# Patient Record
Sex: Male | Born: 1950 | Race: White | Hispanic: No | Marital: Married | Smoking: Light tobacco smoker
Health system: Southern US, Community
[De-identification: ages and names within clinical notes are randomized; demographics above are authoritative.]

## PROBLEM LIST (undated history)

## (undated) DIAGNOSIS — J449 Chronic obstructive pulmonary disease, unspecified: Secondary | ICD-10-CM

## (undated) DIAGNOSIS — F419 Anxiety disorder, unspecified: Secondary | ICD-10-CM

## (undated) DIAGNOSIS — K635 Polyp of colon: Secondary | ICD-10-CM

## (undated) DIAGNOSIS — N400 Enlarged prostate without lower urinary tract symptoms: Secondary | ICD-10-CM

## (undated) DIAGNOSIS — E039 Hypothyroidism, unspecified: Secondary | ICD-10-CM

## (undated) DIAGNOSIS — F32A Depression, unspecified: Secondary | ICD-10-CM

## (undated) DIAGNOSIS — E785 Hyperlipidemia, unspecified: Secondary | ICD-10-CM

## (undated) DIAGNOSIS — N200 Calculus of kidney: Secondary | ICD-10-CM

## (undated) DIAGNOSIS — D649 Anemia, unspecified: Secondary | ICD-10-CM

## (undated) DIAGNOSIS — I639 Cerebral infarction, unspecified: Secondary | ICD-10-CM

## (undated) DIAGNOSIS — R569 Unspecified convulsions: Secondary | ICD-10-CM

## (undated) DIAGNOSIS — E119 Type 2 diabetes mellitus without complications: Secondary | ICD-10-CM

## (undated) DIAGNOSIS — Z87442 Personal history of urinary calculi: Secondary | ICD-10-CM

## (undated) DIAGNOSIS — K5730 Diverticulosis of large intestine without perforation or abscess without bleeding: Secondary | ICD-10-CM

## (undated) DIAGNOSIS — I10 Essential (primary) hypertension: Secondary | ICD-10-CM

## (undated) DIAGNOSIS — N189 Chronic kidney disease, unspecified: Secondary | ICD-10-CM

## (undated) DIAGNOSIS — F329 Major depressive disorder, single episode, unspecified: Secondary | ICD-10-CM

## (undated) HISTORY — DX: Chronic kidney disease, unspecified: N18.9

## (undated) HISTORY — DX: Unspecified convulsions: R56.9

## (undated) HISTORY — DX: Chronic obstructive pulmonary disease, unspecified: J44.9

## (undated) HISTORY — PX: VARICOCELE EXCISION: SUR582

## (undated) HISTORY — DX: Type 2 diabetes mellitus without complications: E11.9

## (undated) HISTORY — DX: Hyperlipidemia, unspecified: E78.5

## (undated) HISTORY — PX: OTHER SURGICAL HISTORY: SHX169

## (undated) HISTORY — PX: COLONOSCOPY: SHX174

## (undated) HISTORY — PX: THYROIDECTOMY: SHX17

## (undated) HISTORY — DX: Essential (primary) hypertension: I10

## (undated) HISTORY — DX: Cerebral infarction, unspecified: I63.9

## (undated) SURGERY — VIDEO BRONCHOSCOPY WITHOUT FLUORO
Anesthesia: Moderate Sedation

---
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
## 2005-12-26 ENCOUNTER — Ambulatory Visit: Payer: Self-pay | Admitting: Unknown Physician Specialty

## 2008-07-28 ENCOUNTER — Ambulatory Visit: Payer: Self-pay | Admitting: Unknown Physician Specialty

## 2010-06-27 ENCOUNTER — Ambulatory Visit: Payer: Self-pay | Admitting: Internal Medicine

## 2012-02-01 ENCOUNTER — Ambulatory Visit: Payer: Self-pay | Admitting: Internal Medicine

## 2012-06-23 ENCOUNTER — Ambulatory Visit: Payer: Self-pay | Admitting: Unknown Physician Specialty

## 2012-06-24 LAB — PATHOLOGY REPORT

## 2013-08-08 ENCOUNTER — Emergency Department: Payer: Self-pay | Admitting: Emergency Medicine

## 2013-08-08 DIAGNOSIS — E119 Type 2 diabetes mellitus without complications: Secondary | ICD-10-CM | POA: Insufficient documentation

## 2013-08-08 DIAGNOSIS — D649 Anemia, unspecified: Secondary | ICD-10-CM | POA: Insufficient documentation

## 2013-08-08 DIAGNOSIS — J449 Chronic obstructive pulmonary disease, unspecified: Secondary | ICD-10-CM | POA: Insufficient documentation

## 2013-08-08 DIAGNOSIS — E785 Hyperlipidemia, unspecified: Secondary | ICD-10-CM | POA: Insufficient documentation

## 2013-08-08 DIAGNOSIS — M549 Dorsalgia, unspecified: Secondary | ICD-10-CM

## 2013-08-08 DIAGNOSIS — I10 Essential (primary) hypertension: Secondary | ICD-10-CM | POA: Insufficient documentation

## 2013-08-08 DIAGNOSIS — G8929 Other chronic pain: Secondary | ICD-10-CM | POA: Insufficient documentation

## 2013-08-08 DIAGNOSIS — N400 Enlarged prostate without lower urinary tract symptoms: Secondary | ICD-10-CM | POA: Insufficient documentation

## 2013-08-08 DIAGNOSIS — E1169 Type 2 diabetes mellitus with other specified complication: Secondary | ICD-10-CM | POA: Insufficient documentation

## 2013-08-08 DIAGNOSIS — L719 Rosacea, unspecified: Secondary | ICD-10-CM | POA: Insufficient documentation

## 2013-08-08 LAB — URINALYSIS, COMPLETE
Bacteria: NONE SEEN
Bilirubin,UR: NEGATIVE
Glucose,UR: 50 mg/dL (ref 0–75)
Ketone: NEGATIVE
Leukocyte Esterase: NEGATIVE
Nitrite: NEGATIVE
Ph: 5 (ref 4.5–8.0)
Protein: >=500
RBC,UR: 4 /HPF (ref 0–5)
Specific Gravity: 1.012 (ref 1.003–1.030)
Squamous Epithelial: <1
WBC UR: 4 /HPF (ref 0–5)

## 2013-08-08 LAB — CBC
HCT: 42.1 % (ref 40.0–52.0)
HGB: 14.2 g/dL (ref 13.0–18.0)
MCH: 30.9 pg (ref 26.0–34.0)
MCHC: 33.7 g/dL (ref 32.0–36.0)
MCV: 92 fL (ref 80–100)
Platelet: 144 10*3/uL — ABNORMAL LOW (ref 150–440)
RBC: 4.59 10*6/uL (ref 4.40–5.90)
RDW: 12.9 % (ref 11.5–14.5)
WBC: 12.4 10*3/uL — ABNORMAL HIGH (ref 3.8–10.6)

## 2013-08-08 LAB — COMPREHENSIVE METABOLIC PANEL
Albumin: 3.4 g/dL (ref 3.4–5.0)
Alkaline Phosphatase: 72 U/L
Anion Gap: 10 (ref 7–16)
BUN: 32 mg/dL — ABNORMAL HIGH (ref 7–18)
Bilirubin,Total: 0.3 mg/dL (ref 0.2–1.0)
Calcium, Total: 9.1 mg/dL (ref 8.5–10.1)
Chloride: 107 mmol/L (ref 98–107)
Co2: 21 mmol/L (ref 21–32)
Creatinine: 2.04 mg/dL — ABNORMAL HIGH (ref 0.60–1.30)
EGFR (African American): 38 — ABNORMAL LOW
EGFR (Non-African Amer.): 33 — ABNORMAL LOW
Glucose: 100 mg/dL — ABNORMAL HIGH (ref 65–99)
Osmolality: 283 (ref 275–301)
Potassium: 3.9 mmol/L (ref 3.5–5.1)
SGOT(AST): 30 U/L (ref 15–37)
SGPT (ALT): 22 U/L (ref 12–78)
Sodium: 138 mmol/L (ref 136–145)
Total Protein: 6.7 g/dL (ref 6.4–8.2)

## 2013-08-26 ENCOUNTER — Ambulatory Visit: Payer: Self-pay | Admitting: Neurology

## 2014-02-15 DIAGNOSIS — M7030 Other bursitis of elbow, unspecified elbow: Secondary | ICD-10-CM | POA: Insufficient documentation

## 2014-02-15 DIAGNOSIS — M7021 Olecranon bursitis, right elbow: Secondary | ICD-10-CM | POA: Insufficient documentation

## 2014-03-08 DIAGNOSIS — D1720 Benign lipomatous neoplasm of skin and subcutaneous tissue of unspecified limb: Secondary | ICD-10-CM | POA: Insufficient documentation

## 2014-03-08 DIAGNOSIS — M65811 Other synovitis and tenosynovitis, right shoulder: Secondary | ICD-10-CM | POA: Insufficient documentation

## 2014-03-18 ENCOUNTER — Ambulatory Visit: Payer: Self-pay | Admitting: Surgery

## 2015-03-15 DIAGNOSIS — Z794 Long term (current) use of insulin: Secondary | ICD-10-CM | POA: Insufficient documentation

## 2015-03-15 DIAGNOSIS — E119 Type 2 diabetes mellitus without complications: Secondary | ICD-10-CM | POA: Insufficient documentation

## 2015-03-29 DIAGNOSIS — N183 Chronic kidney disease, stage 3 unspecified: Secondary | ICD-10-CM | POA: Insufficient documentation

## 2015-05-05 ENCOUNTER — Encounter: Payer: Medicare Other | Attending: Internal Medicine | Admitting: *Deleted

## 2015-05-05 ENCOUNTER — Encounter: Payer: Self-pay | Admitting: *Deleted

## 2015-05-05 VITALS — BP 138/72 | Ht 72.0 in | Wt 226.1 lb

## 2015-05-05 DIAGNOSIS — N184 Chronic kidney disease, stage 4 (severe): Secondary | ICD-10-CM

## 2015-05-05 DIAGNOSIS — E1122 Type 2 diabetes mellitus with diabetic chronic kidney disease: Secondary | ICD-10-CM

## 2015-05-05 DIAGNOSIS — E119 Type 2 diabetes mellitus without complications: Secondary | ICD-10-CM | POA: Insufficient documentation

## 2015-05-05 DIAGNOSIS — Z794 Long term (current) use of insulin: Secondary | ICD-10-CM

## 2015-05-05 NOTE — Patient Instructions (Addendum)
Check blood sugars at least 3 x day before each meal or as needed for symptoms  Exercise: Begin walking  for 15 minutes 3  days a week and gradually increase to 150 minutes/week Eat 3 meals day,  1-2  snacks a day Space meals 4-6 hours apart Don't skip meals Complete 3 Day Food Record and bring to next appt Quit smoking Make an eye doctor appointment Bring blood sugar records to the next appointment Carry fast acting glucose and a snack at all times Return for appointment on:  Friday March 24 at 9:30 am - with Jeannene Patella (dietitian)

## 2015-05-05 NOTE — Progress Notes (Signed)
Diabetes Self-Management Education  Visit Type: First/Initial  Appt. Start Time: 1320 Appt. End Time: 1500  05/05/2015  [name], identified by name and date of birth, is a 65 y.o. male with a diagnosis of Diabetes: Type 2.   ASSESSMENT  Blood pressure 138/72, height 6' (1.829 m), weight 226 lb 1.6 oz (102.558 kg). Body mass index is 30.66 kg/(m^2).      Diabetes Self-Management Education - 05/05/15 1542    Visit Information   Visit Type First/Initial   Initial Visit   Diabetes Type Type 2   Are you currently following a meal plan? No   Are you taking your medications as prescribed? No Pt skips meals and insulin bolus   Date Diagnosed 10 years ago   Health Coping   How would you rate your overall health? Fair   Psychosocial Assessment   Patient Belief/Attitude about Diabetes Other (comment)  "worried"   Self-care barriers None   Self-management support Doctor's office;Family   Other persons present Spouse/SO   Patient Concerns Nutrition/Meal planning;Medication;Monitoring;Healthy Lifestyle;Problem Solving;Glycemic Control;Weight Control   Special Needs None   Preferred Learning Style Auditory   Learning Readiness Contemplating   How often do you need to have someone help you when you read instructions, pamphlets, or other written materials from your doctor or pharmacy? 1 - Never   What is the last grade level you completed in school? 12   Complications   Last HgB A1C per patient/outside source 7.9 %  04/11/15   How often do you check your blood sugar? 1-2 times/day   Fasting Blood glucose range (mg/dL) 70-129;130-179;180-200;>200  FBG's 72-234 mg/dL   Postprandial Blood glucose range (mg/dL) --  pre-lunch 78-243 mg/dL; pre-supper 85-217 mg/dL   Have you had a dilated eye exam in the past 12 months? No   Have you had a dental exam in the past 12 months? Yes   Are you checking your feet? Yes   How many days per week are you checking your feet? 3   Dietary  Intake   Breakfast skips or has pack of peanut butter crackers   Lunch sandwich or oatmeal with peanut butter or salad   Dinner cereal, meat and vegetables   Snack (evening) nuts   Beverage(s) water, vegetable juice, milk   Exercise   Exercise Type ADL's   Patient Education   Previous Diabetes Education Yes (please comment)  approx 10 years ago at [facility]   Disease state  Definition of diabetes, type 1 and 2, and the diagnosis of diabetes   Nutrition management  Role of diet in the treatment of diabetes and the relationship between the three main macronutrients and blood glucose level;Carbohydrate counting;Meal timing in regards to the patients' current diabetes medication.;Effects of alcohol on blood glucose and safety factors with consumption of alcohol.   Physical activity and exercise  Role of exercise on diabetes management, blood pressure control and cardiac health.   Medications Reviewed patients medication for diabetes, action, purpose, timing of dose and side effects.   Monitoring Purpose and frequency of SMBG.;Identified appropriate SMBG and/or A1C goals.   Acute complications Taught treatment of hypoglycemia - the 15 rule.   Chronic complications Relationship between chronic complications and blood glucose control;Retinopathy and reason for yearly dilated eye exams;Nephropathy, what it is, prevention of, the use of ACE, ARB's and early detection of through urine microalbumia.   Psychosocial adjustment Identified and addressed patients feelings and concerns about diabetes   Personal strategies to promote health Review  risk of smoking and offered smoking cessation   Individualized Goals (developed by patient)   Reducing Risk Improve blood sugars Decrease medications Prevent diabetes complications Lose weight Lead a healthier lifestyle Become more fit Quit smoking   Outcomes   Expected Outcomes Demonstrated interest in learning. Expect positive outcomes      Individualized Plan  for Diabetes Self-Management Training:   Learning Objective:  Patient will have a greater understanding of diabetes self-management. Patient education plan is to attend individual and/or group sessions per assessed needs and concerns.   Plan:   Patient Instructions  Check blood sugars at least 3 x day before each meal or as needed for symptoms  Exercise: Begin walking  for 15 minutes 3  days a week and gradually increase to 150 minutes/week Eat 3 meals day,  1-2  snacks a day Space meals 4-6 hours apart Don't skip meals Complete 3 Day Food Record and bring to next appt Quit smoking Make an eye doctor appointment Bring blood sugar records to the next appointment Carry fast acting glucose and a snack at all times Return for appointment on:  Friday March 24 at 9:30 am - with Jeannene Patella (dietitian)   Expected Outcomes:  Demonstrated interest in learning. Expect positive outcomes  Education material provided:  General Meal Planning Guidelines Simple Meal Plan 3 Day Food Record Symptoms, causes and treatments of Hypoglycemia  If problems or questions, patient to contact team via:   Johny Drilling, [place], [place], CDE [phone]  Future DSME appointment:  Friday May 20, 2015 at 9:30 am with [facility] (dietitian)

## 2015-05-19 ENCOUNTER — Other Ambulatory Visit
Admission: RE | Admit: 2015-05-19 | Discharge: 2015-05-19 | Disposition: A | Discharge status: Home / Self Care | Payer: Medicare Other | Source: Ambulatory Visit | Attending: Nurse Practitioner | Admitting: Nurse Practitioner

## 2015-05-19 DIAGNOSIS — K529 Noninfective gastroenteritis and colitis, unspecified: Secondary | ICD-10-CM | POA: Insufficient documentation

## 2015-05-19 LAB — GASTROINTESTINAL PANEL BY PCR, STOOL (REPLACES STOOL CULTURE)

## 2015-05-19 LAB — C DIFFICILE QUICK SCREEN W PCR REFLEX
C Diff antigen: NEGATIVE
C Diff interpretation: NEGATIVE
C Diff toxin: NEGATIVE

## 2015-05-20 ENCOUNTER — Encounter: Payer: Self-pay | Admitting: Dietician

## 2015-05-20 ENCOUNTER — Encounter: Payer: Medicare Other | Admitting: Dietician

## 2015-05-20 VITALS — BP 136/66 | Ht 72.0 in | Wt 225.6 lb

## 2015-05-20 DIAGNOSIS — R0789 Other chest pain: Secondary | ICD-10-CM | POA: Insufficient documentation

## 2015-05-20 DIAGNOSIS — Z720 Tobacco use: Secondary | ICD-10-CM | POA: Insufficient documentation

## 2015-05-20 DIAGNOSIS — E119 Type 2 diabetes mellitus without complications: Secondary | ICD-10-CM | POA: Diagnosis not present

## 2015-05-20 DIAGNOSIS — N184 Chronic kidney disease, stage 4 (severe): Principal | ICD-10-CM

## 2015-05-20 DIAGNOSIS — G40909 Epilepsy, unspecified, not intractable, without status epilepticus: Secondary | ICD-10-CM | POA: Insufficient documentation

## 2015-05-20 DIAGNOSIS — Z794 Long term (current) use of insulin: Secondary | ICD-10-CM

## 2015-05-20 DIAGNOSIS — R569 Unspecified convulsions: Secondary | ICD-10-CM | POA: Insufficient documentation

## 2015-05-20 DIAGNOSIS — N200 Calculus of kidney: Secondary | ICD-10-CM | POA: Insufficient documentation

## 2015-05-20 DIAGNOSIS — L709 Acne, unspecified: Secondary | ICD-10-CM | POA: Insufficient documentation

## 2015-05-20 DIAGNOSIS — E1122 Type 2 diabetes mellitus with diabetic chronic kidney disease: Secondary | ICD-10-CM

## 2015-05-20 NOTE — Progress Notes (Signed)
Diabetes Self-Management Education  Visit Type:  Follow-up  Appt. Start Time: 0930 Appt. End Time: [id number]  05/20/2015  [name], identified by name and date of birth, is a 65 y.o. male with a diagnosis of Diabetes:  .   ASSESSMENT  Blood pressure 136/66, height 6' (1.829 m), weight 225 lb 9.6 oz (102.331 kg). Body mass index is 30.59 kg/(m^2).       Diabetes Self-Management Education - [id number]    Complications   How often do you check your blood sugar? 1-2 times/day  2-3 times daily   Fasting Blood glucose range (mg/dL) 70-129   Postprandial Blood glucose range (mg/dL) 130-179  before meals   Have you had a dilated eye exam in the past 12 months? No   Have you had a dental exam in the past 12 months? Yes   Are you checking your feet? Yes   How many days per week are you checking your feet? 3   Dietary Intake   Breakfast oatmeal or cream of wheat, sometimes Nabs, occasionally eggs and bacon or pancakes or biscuit   Lunch sandwich when at home; out on Fridays -- [place], [employer] [place]), various restaurants   Snack (afternoon) 1 beer with nuts or chex mix, sometimes Nabs or granola bar   Dinner varies: kale salad 3/23; chicken pie and butter beans 3/22; sometimes pizza. Trying to increase vegetables and fruits.   Beverage(s) flavored water, some sweet tea    Exercise   Exercise Type ADL's  likes to swim, not much energy lately   Patient Education   Disease state  Other (comment)  Diabetic Kidney Disease   Nutrition management  Role of diet in the treatment of diabetes and the relationship between the three main macronutrients and blood glucose level;Meal timing in regards to the patients' current diabetes medication.;Information on hints to eating out and maintain blood glucose control.;Meal options for control of blood glucose level and chronic complications.;Food label reading, portion sizes and measuring food.  limiting protein intake and sodium to keep GFR  stable as long as possible   Outcomes   Program Status Completed      Learning Objective:  Patient will have a greater understanding of diabetes self-management. Patient education plan is to attend individual and/or group sessions per assessed needs and concerns.   Plan:   Patient Instructions   Keep protein portions to 3oz or less.   Allow for 4 servings of carbohydrate foods with each meal. If you drink any sweet tea, count each 6oz as 1 carb serving.   You can also try 1/2-sweet tea, ask for 1/2 and 1/2 tea at restaurants.   Keep up your work to include plenty of veggies and fruits!     Expected Outcomes:  Demonstrated interest in learning. Expect positive outcomes  Education material provided: Planning A Balanced Meal with guide for 1800kcal daily.          Top 15 foods for people with kidney disease.          If problems or questions, patient to contact team via:  Phone  Future DSME appointment: - none scheduled, patient has completed refresher program.

## 2015-05-20 NOTE — Patient Instructions (Addendum)
   Keep protein portions to 3oz or less.   Allow for 4 servings of carbohydrate foods with each meal. If you drink any sweet tea, count each 6oz as 1 carb serving.   You can also try 1/2-sweet tea, ask for 1/2 and 1/2 tea at restaurants.   Keep up your work to include plenty of veggies and fruits!

## 2015-05-27 ENCOUNTER — Encounter: Payer: Self-pay | Admitting: *Deleted

## 2015-05-30 ENCOUNTER — Ambulatory Visit: Payer: Medicare Other | Admitting: Certified Registered Nurse Anesthetist

## 2015-05-30 ENCOUNTER — Encounter: Payer: Self-pay | Admitting: *Deleted

## 2015-05-30 ENCOUNTER — Encounter
Admission: RE | Disposition: A | Discharge status: Home / Self Care | Payer: Self-pay | Source: Ambulatory Visit | Attending: Unknown Physician Specialty

## 2015-05-30 ENCOUNTER — Ambulatory Visit
Admission: RE | Admit: 2015-05-30 | Discharge: 2015-05-30 | Disposition: A | Discharge status: Home / Self Care | Payer: Medicare Other | Source: Ambulatory Visit | Attending: Unknown Physician Specialty | Admitting: Unknown Physician Specialty

## 2015-05-30 DIAGNOSIS — Z7984 Long term (current) use of oral hypoglycemic drugs: Secondary | ICD-10-CM | POA: Diagnosis not present

## 2015-05-30 DIAGNOSIS — N189 Chronic kidney disease, unspecified: Secondary | ICD-10-CM | POA: Insufficient documentation

## 2015-05-30 DIAGNOSIS — D122 Benign neoplasm of ascending colon: Secondary | ICD-10-CM | POA: Diagnosis not present

## 2015-05-30 DIAGNOSIS — Z7951 Long term (current) use of inhaled steroids: Secondary | ICD-10-CM | POA: Diagnosis not present

## 2015-05-30 DIAGNOSIS — D125 Benign neoplasm of sigmoid colon: Secondary | ICD-10-CM | POA: Diagnosis not present

## 2015-05-30 DIAGNOSIS — E1122 Type 2 diabetes mellitus with diabetic chronic kidney disease: Secondary | ICD-10-CM | POA: Diagnosis not present

## 2015-05-30 DIAGNOSIS — K5730 Diverticulosis of large intestine without perforation or abscess without bleeding: Secondary | ICD-10-CM | POA: Diagnosis not present

## 2015-05-30 DIAGNOSIS — I129 Hypertensive chronic kidney disease with stage 1 through stage 4 chronic kidney disease, or unspecified chronic kidney disease: Secondary | ICD-10-CM | POA: Diagnosis not present

## 2015-05-30 DIAGNOSIS — D123 Benign neoplasm of transverse colon: Secondary | ICD-10-CM | POA: Diagnosis not present

## 2015-05-30 DIAGNOSIS — E785 Hyperlipidemia, unspecified: Secondary | ICD-10-CM | POA: Insufficient documentation

## 2015-05-30 DIAGNOSIS — Z794 Long term (current) use of insulin: Secondary | ICD-10-CM | POA: Insufficient documentation

## 2015-05-30 DIAGNOSIS — K529 Noninfective gastroenteritis and colitis, unspecified: Secondary | ICD-10-CM | POA: Diagnosis not present

## 2015-05-30 DIAGNOSIS — N400 Enlarged prostate without lower urinary tract symptoms: Secondary | ICD-10-CM | POA: Insufficient documentation

## 2015-05-30 DIAGNOSIS — K640 First degree hemorrhoids: Secondary | ICD-10-CM | POA: Diagnosis not present

## 2015-05-30 DIAGNOSIS — J449 Chronic obstructive pulmonary disease, unspecified: Secondary | ICD-10-CM | POA: Insufficient documentation

## 2015-05-30 DIAGNOSIS — Z79899 Other long term (current) drug therapy: Secondary | ICD-10-CM | POA: Diagnosis not present

## 2015-05-30 DIAGNOSIS — F17210 Nicotine dependence, cigarettes, uncomplicated: Secondary | ICD-10-CM | POA: Insufficient documentation

## 2015-05-30 HISTORY — DX: Benign prostatic hyperplasia without lower urinary tract symptoms: N40.0

## 2015-05-30 HISTORY — PX: COLONOSCOPY WITH PROPOFOL: SHX5780

## 2015-05-30 HISTORY — DX: Calculus of kidney: N20.0

## 2015-05-30 LAB — GLUCOSE, CAPILLARY: Glucose-Capillary: 182 mg/dL — ABNORMAL HIGH (ref 65–99)

## 2015-05-30 SURGERY — COLONOSCOPY WITH PROPOFOL
Anesthesia: General

## 2015-05-30 MED ORDER — MIDAZOLAM HCL 2 MG/2ML IJ SOLN
INTRAMUSCULAR | Status: DC | PRN
  Administered 2015-05-30: 1 mg via INTRAVENOUS

## 2015-05-30 MED ORDER — LIDOCAINE HCL (CARDIAC) 20 MG/ML IV SOLN
INTRAVENOUS | Status: DC | PRN
  Administered 2015-05-30: 60 mg via INTRAVENOUS

## 2015-05-30 MED ORDER — SODIUM CHLORIDE 0.9 % IV SOLN
INTRAVENOUS | Status: DC
  Administered 2015-05-30: 08:00:00 via INTRAVENOUS

## 2015-05-30 MED ORDER — PROPOFOL 500 MG/50ML IV EMUL
INTRAVENOUS | Status: DC | PRN
  Administered 2015-05-30: 140 ug/kg/min via INTRAVENOUS

## 2015-05-30 MED ORDER — SODIUM CHLORIDE 0.9 % IV SOLN: INTRAVENOUS | Status: DC

## 2015-05-30 MED ORDER — PROPOFOL 10 MG/ML IV BOLUS
INTRAVENOUS | Status: DC | PRN
  Administered 2015-05-30: 30 mg via INTRAVENOUS
  Administered 2015-05-30 (×2): 20 mg via INTRAVENOUS

## 2015-05-30 NOTE — Transfer of Care (Signed)
Immediate Anesthesia Transfer of Care Note  Patient: Kyle [name]  Procedure(s) Performed: Procedure(s): COLONOSCOPY WITH PROPOFOL (N/A)  Patient Location: PACU  Anesthesia Type:General  Level of Consciousness: sedated  Airway & Oxygen Therapy: Patient Spontanous Breathing and Patient connected to nasal cannula oxygen  Post-op Assessment: Report given to RN and Post -op Vital signs reviewed and stable  Post vital signs: Reviewed and stable  Last Vitals:  Filed Vitals:   05/30/15 0742  BP: 135/56  Pulse: 81  Temp: 35.7 C  Resp: 18    Complications: No apparent anesthesia complications

## 2015-05-30 NOTE — Anesthesia Procedure Notes (Signed)
Date/Time: 05/30/2015 8:19 AM Performed by: Johnna Acosta Pre-anesthesia Checklist: Patient identified, Emergency Drugs available, Suction available, Patient being monitored and Timeout performed Patient Re-evaluated:Patient Re-evaluated prior to inductionOxygen Delivery Method: Nasal cannula

## 2015-05-30 NOTE — Anesthesia Postprocedure Evaluation (Signed)
Anesthesia Post Note  Patient: Kyle [name]  Procedure(s) Performed: Procedure(s) (LRB): COLONOSCOPY WITH PROPOFOL (N/A)  Patient location during evaluation: Endoscopy Anesthesia Type: General Level of consciousness: awake and alert Pain management: pain level controlled Vital Signs Assessment: post-procedure vital signs reviewed and stable Respiratory status: spontaneous breathing, nonlabored ventilation, respiratory function stable and patient connected to nasal cannula oxygen Cardiovascular status: blood pressure returned to baseline and stable Postop Assessment: no signs of nausea or vomiting Anesthetic complications: no    Last Vitals:  Filed Vitals:   05/30/15 0920 05/30/15 0930  BP: 138/63 137/64  Pulse: 67 68  Temp:    Resp: 12 15    Last Pain: There were no vitals filed for this visit.               Martha Clan

## 2015-05-30 NOTE — Anesthesia Preprocedure Evaluation (Signed)
Anesthesia Evaluation  Patient identified by MRN, date of birth, ID band Patient awake    Reviewed: Allergy & Precautions, H&P , NPO status , Patient's Chart, lab work & pertinent test results, reviewed documented beta blocker date and time   History of Anesthesia Complications Negative for: history of anesthetic complications  Airway Mallampati: II  TM Distance: >3 FB Neck ROM: full    Dental no notable dental hx. (+) Poor Dentition, Caps, Missing   Pulmonary neg shortness of breath, sleep apnea (likely based on symptoms) , COPD, neg recent URI, Current Smoker,    Pulmonary exam normal breath sounds clear to auscultation       Cardiovascular Exercise Tolerance: Good hypertension, On Medications and On Home Beta Blockers (-) angina(-) CAD, (-) Past MI, (-) Cardiac Stents and (-) CABG Normal cardiovascular exam(-) dysrhythmias (-) Valvular Problems/Murmurs Rhythm:regular Rate:Normal     Neuro/Psych Seizures -,  negative psych ROS   GI/Hepatic negative GI ROS, Neg liver ROS,   Endo/Other  diabetes, Insulin Dependent, Oral Hypoglycemic Agents  Renal/GU CRFRenal disease  negative genitourinary   Musculoskeletal   Abdominal   Peds  Hematology negative hematology ROS (+)   Anesthesia Other Findings Past Medical History:   Diabetes mellitus without complication (HCC)                 Hypertension                                                 Hyperlipidemia                                               COPD (chronic obstructive pulmonary disease) (*              Seizures (HCC)                                               BPH (benign prostatic hyperplasia)                           Chronic kidney disease                                       Nephrolithiasis                                              Reproductive/Obstetrics negative OB ROS                             Anesthesia  Physical Anesthesia Plan  ASA: III  Anesthesia Plan: General   Post-op Pain Management:    Induction:   Airway Management Planned:   Additional Equipment:   Intra-op Plan:   Post-operative Plan:   Informed Consent: I have reviewed the patients History and Physical, chart, labs and discussed the procedure including  the risks, benefits and alternatives for the proposed anesthesia with the patient or authorized representative who has indicated his/her understanding and acceptance.   Dental Advisory Given  Plan Discussed with: Anesthesiologist, CRNA and Surgeon  Anesthesia Plan Comments:         Anesthesia Quick Evaluation

## 2015-05-30 NOTE — H&P (Signed)
Primary Care Physician:  Idelle Crouch, MD Primary Gastroenterologist:  Dr. Vira Agar  Pre-Procedure History & Physical: HPI:  Kyle [name] is a 65 y.o. male is here for an colonoscopy.   Past Medical History  Diagnosis Date  . Diabetes mellitus without complication ([place])   . Hypertension   . Hyperlipidemia   . COPD (chronic obstructive pulmonary disease) ([place])   . Seizures ([place])   . BPH (benign prostatic hyperplasia)   . Chronic kidney disease   . Nephrolithiasis     Past Surgical History  Procedure Laterality Date  . Kidney stone    . Colonoscopy    . Varicocele excision      Prior to Admission medications   Medication Sig Start Date End Date Taking? Authorizing Provider  hydrALAZINE (APRESOLINE) 50 MG tablet Take 1 tablet by mouth daily. 05/12/15  Yes Historical Provider, MD  levETIRAcetam (KEPPRA) 500 MG tablet Take 500 mg by mouth daily. 06/17/14  Yes Historical Provider, MD  losartan-hydrochlorothiazide (HYZAAR) 100-12.5 MG tablet Take 1 tablet by mouth daily. 07/01/14 07/01/15 Yes Historical Provider, MD  metoprolol succinate (TOPROL-XL) 50 MG 24 hr tablet Take 1 tablet by mouth daily. 03/15/15 03/14/16 Yes Historical Provider, MD  atorvastatin (LIPITOR) 10 MG tablet Take 10 mg by mouth daily. 04/08/15   Historical Provider, MD  Cholecalciferol (VITAMIN D3) 5000 units TABS Take 1 tablet by mouth daily.    Historical Provider, MD  fluticasone (FLONASE) 50 MCG/ACT nasal spray Place 2 sprays into both nostrils daily as needed. Reported on 05/05/2015 01/14/14   Historical Provider, MD  glipiZIDE (GLUCOTROL XL) 10 MG 24 hr tablet Take 10 mg by mouth daily. 03/29/15   Historical Provider, MD  insulin lispro (HUMALOG) 100 UNIT/ML injection VGo - 40; 5 clicks with each meal; 3 clicks for snacks; if over [id number] take 2 clicks [id number]   Historical Provider, MD  Multiple Vitamins-Minerals (MULTIVITAMIN MEN PO) Take 1 tablet by mouth daily.    Historical Provider, MD  nicotine (RA NICOTINE) 21  mg/24hr patch Place 1 patch onto the skin daily. Reported on 05/05/2015 03/15/15   Historical Provider, MD    Allergies as of 05/20/2015  . (No Known Allergies)    Family History  Problem Relation Age of Onset  . Diabetes Mother   . Diabetes Maternal Grandmother   . Diabetes Maternal Grandfather     Social History   Social History  . Marital Status: Married    Spouse Name: N/A  . Number of Children: N/A  . Years of Education: N/A   Occupational History  . Not on file.   Social History Main Topics  . Smoking status: Current Every Day Smoker -- 0.50 packs/day for 30 years    Types: Cigarettes  . Smokeless tobacco: Never Used  . Alcohol Use: 4.8 oz/week    8 Cans of beer per week     Comment: beer or wine   . Drug Use: No  . Sexual Activity: Not on file   Other Topics Concern  . Not on file   Social History Narrative    Review of Systems: See HPI, otherwise negative ROS  Physical Exam: BP 135/56 mmHg  Pulse 81  Temp(Src) 96.2 F (35.7 C) (Tympanic)  Resp 18  Ht 6' (1.829 m)  Wt 102.059 kg (225 lb)  BMI 30.51 kg/m2  SpO2 98% General:   Alert,  pleasant and cooperative in NAD Head:  Normocephalic and atraumatic. Neck:  Supple; no masses or thyromegaly. Lungs:  Clear  throughout to auscultation.    Heart:  Regular rate and rhythm. Abdomen:  Soft, nontender and nondistended. Normal bowel sounds, without guarding, and without rebound.   Neurologic:  Alert and  oriented x4;  grossly normal neurologically.  Impression/Plan: SAQIB SUNDE is here for an colonoscopy to be performed for [facility] colon polyps,chronic diarrhea  Risks, benefits, limitations, and alternatives regarding  colonoscopy have been reviewed with the patient.  Questions have been answered.  All parties agreeable.   Gaylyn Cheers, MD  05/30/2015, 8:18 AM

## 2015-05-30 NOTE — Op Note (Signed)
[facility] Gastroenterology Patient Name: Kyle [name] Procedure Date: 05/30/2015 8:12 AM MRN: [id number] Account #: [id number] Date of Birth: 08-14-1950 Admit Type: Outpatient Age: 65 Room: [facility] ENDO ROOM 1 Gender: Male Note Status: Finalized Procedure:            Colonoscopy Indications:          High risk colon cancer surveillance: Personal history                        of colonic polyps Providers:            Manya Silvas, MD Referring MD:         Leonie Douglas. Doy Hutching, MD (Referring MD) Medicines:            Propofol per Anesthesia Complications:        No immediate complications. Procedure:            Pre-Anesthesia Assessment:                       - After reviewing the risks and benefits, the patient                        was deemed in satisfactory condition to undergo the                        procedure.                       After obtaining informed consent, the colonoscope was                        passed under direct vision. Throughout the procedure,                        the patient's blood pressure, pulse, and oxygen                        saturations were monitored continuously. The Olympus                        PCF-H180AL colonoscope ( S#: [id number] ) was introduced                        through the anus and advanced to the the cecum,                        identified by appendiceal orifice and ileocecal valve.                        The colonoscopy was performed without difficulty. The                        patient tolerated the procedure well. The quality of                        the bowel preparation was good. Findings:      Three sessile polyps were found in the transverse colon and ascending       colon. The polyps were small in size. These polyps were removed with a       hot snare. Resection and retrieval were  complete.      Three sessile polyps were found in the sigmoid colon and transverse       colon. The polyps were diminutive  in size. These polyps were removed       with a jumbo cold forceps. Resection and retrieval were complete.      Multiple small-mouthed diverticula were found in the sigmoid colon and       descending colon.      There was a large lipoma, in the transverse colon. Biopsies were taken       with a cold forceps for histology.      Internal hemorrhoids were found during endoscopy. The hemorrhoids were       small and Grade I (internal hemorrhoids that do not prolapse).      The exam was otherwise without abnormality. Impression:           - Three small polyps in the transverse colon and in the                        ascending colon, removed with a hot snare. Resected and                        retrieved.                       - Three diminutive polyps in the sigmoid colon and in                        the transverse colon, removed with a jumbo cold                        forceps. Resected and retrieved.                       - Diverticulosis in the sigmoid colon and in the                        descending colon.                       - Large lipoma in the transverse colon. Biopsied.                       - Internal hemorrhoids.                       - The examination was otherwise normal. Recommendation:       - Await pathology results. Manya Silvas, MD 05/30/2015 9:29:59 AM This report has been signed electronically. Number of Addenda: 0 Note Initiated On: 05/30/2015 8:12 AM Scope Withdrawal Time: 0 hours 15 minutes 37 seconds  Total Procedure Duration: 0 hours 28 minutes 53 seconds       [facility]

## 2015-05-31 ENCOUNTER — Encounter: Payer: Self-pay | Admitting: Unknown Physician Specialty

## 2015-05-31 LAB — SURGICAL PATHOLOGY

## 2015-07-01 DIAGNOSIS — Z86010 Personal history of colonic polyps: Secondary | ICD-10-CM | POA: Insufficient documentation

## 2015-07-01 DIAGNOSIS — R197 Diarrhea, unspecified: Secondary | ICD-10-CM | POA: Insufficient documentation

## 2015-07-01 DIAGNOSIS — K909 Intestinal malabsorption, unspecified: Secondary | ICD-10-CM | POA: Insufficient documentation

## 2016-04-19 ENCOUNTER — Institutional Professional Consult (permissible substitution): Payer: Medicare Other | Admitting: Pulmonary Disease

## 2016-05-03 ENCOUNTER — Other Ambulatory Visit: Payer: Self-pay | Admitting: Internal Medicine

## 2016-05-03 DIAGNOSIS — R05 Cough: Secondary | ICD-10-CM

## 2016-05-03 DIAGNOSIS — R053 Chronic cough: Secondary | ICD-10-CM

## 2016-05-08 ENCOUNTER — Ambulatory Visit
Admission: RE | Admit: 2016-05-08 | Discharge: 2016-05-08 | Disposition: A | Discharge status: Home / Self Care | Payer: Medicare Other | Source: Ambulatory Visit | Attending: Internal Medicine | Admitting: Internal Medicine

## 2016-05-08 DIAGNOSIS — R05 Cough: Secondary | ICD-10-CM | POA: Diagnosis present

## 2016-05-08 DIAGNOSIS — I2510 Atherosclerotic heart disease of native coronary artery without angina pectoris: Secondary | ICD-10-CM | POA: Insufficient documentation

## 2016-05-08 DIAGNOSIS — Z87891 Personal history of nicotine dependence: Secondary | ICD-10-CM | POA: Insufficient documentation

## 2016-05-08 DIAGNOSIS — J984 Other disorders of lung: Secondary | ICD-10-CM | POA: Insufficient documentation

## 2016-05-08 DIAGNOSIS — I700 Atherosclerosis of aorta: Secondary | ICD-10-CM | POA: Insufficient documentation

## 2016-05-08 DIAGNOSIS — R053 Chronic cough: Secondary | ICD-10-CM

## 2016-05-24 ENCOUNTER — Encounter: Payer: Self-pay | Admitting: Pulmonary Disease

## 2016-05-24 ENCOUNTER — Other Ambulatory Visit (INDEPENDENT_AMBULATORY_CARE_PROVIDER_SITE_OTHER): Payer: Medicare Other

## 2016-05-24 ENCOUNTER — Ambulatory Visit (INDEPENDENT_AMBULATORY_CARE_PROVIDER_SITE_OTHER): Payer: Medicare Other | Admitting: Pulmonary Disease

## 2016-05-24 VITALS — BP 126/66 | HR 85 | Ht 72.0 in | Wt 211.0 lb

## 2016-05-24 DIAGNOSIS — J9811 Atelectasis: Secondary | ICD-10-CM | POA: Diagnosis not present

## 2016-05-24 DIAGNOSIS — Z01818 Encounter for other preprocedural examination: Secondary | ICD-10-CM

## 2016-05-24 DIAGNOSIS — F17210 Nicotine dependence, cigarettes, uncomplicated: Secondary | ICD-10-CM | POA: Diagnosis not present

## 2016-05-24 DIAGNOSIS — R911 Solitary pulmonary nodule: Secondary | ICD-10-CM

## 2016-05-24 DIAGNOSIS — J449 Chronic obstructive pulmonary disease, unspecified: Secondary | ICD-10-CM

## 2016-05-24 LAB — CBC WITH DIFFERENTIAL/PLATELET
Basophils Absolute: 0 10*3/uL (ref 0.0–0.1)
Basophils Relative: 0.2 % (ref 0.0–3.0)
Eosinophils Absolute: 0.1 10*3/uL (ref 0.0–0.7)
Eosinophils Relative: 0.9 % (ref 0.0–5.0)
HCT: 39.2 % (ref 39.0–52.0)
Hemoglobin: 13.4 g/dL (ref 13.0–17.0)
Lymphocytes Relative: 18 % (ref 12.0–46.0)
Lymphs Abs: 1.7 10*3/uL (ref 0.7–4.0)
MCHC: 34.1 g/dL (ref 30.0–36.0)
MCV: 91.5 fl (ref 78.0–100.0)
Monocytes Absolute: 0.5 10*3/uL (ref 0.1–1.0)
Monocytes Relative: 5.6 % (ref 3.0–12.0)
Neutro Abs: 7.2 10*3/uL (ref 1.4–7.7)
Neutrophils Relative %: 75.3 % (ref 43.0–77.0)
Platelets: 196 10*3/uL (ref 150.0–400.0)
RBC: 4.28 Mil/uL (ref 4.22–5.81)
RDW: 13.9 % (ref 11.5–15.5)
WBC: 9.6 10*3/uL (ref 4.0–10.5)

## 2016-05-24 NOTE — Assessment & Plan Note (Signed)
He carries a diagnosis of COPD considering his extensive smoking history and persistent symptoms. We need to get spirometry. We will do this after the bronchoscopy.

## 2016-05-24 NOTE — Assessment & Plan Note (Signed)
Counseled to quit 

## 2016-05-24 NOTE — Patient Instructions (Signed)
We will arrange for a bronchoscopy tomorrow morning at [facility] Do not eat after midnight tonight We will arrange for another CT scan in 6 months to evaluate the pulmonary nodule We will see you tomorrow morning We will arrange a follow-up visit in 2-4 weeks

## 2016-05-24 NOTE — Assessment & Plan Note (Signed)
I have independently reviewed the images from his CT chest which shows isolated collapse of the right middle lobe with no surrounding obvious pathology or mediastinal lymphadenopathy. I'm hopeful that this represents just thick mucus or airway thickening and not malignancy. However, considering his smoking history he is at increased risk for bronchogenic carcinoma.  Plan: Bronchoscopy tomorrow morning at [facility] Nothing by mouth after midnight CBC today

## 2016-05-24 NOTE — Progress Notes (Signed)
Subjective:    Patient ID: Kyle [name], male    DOB: 12-08-1950, 66 y.o.   MRN: [id number]  HPI Chief Complaint  Patient presents with  . Advice Only    Referred by Dr. Eddie [name] for COPD.    Kyle [name] is a smoker who is referred to me for his COPD and an abnormal CT chest.  He tells me that back in September 2017 he had a bad respiratory infection and he had a lot of productive cough which lasted for several weeks.  He saw a physician and was treated with antibiotics ans steroids.  He had a second round of antibiotics and steroids because the symptoms didn't resolved.  Several weeks after this he fell and landed on his right side. He had a CXR performed.  He was referred to Dr. Vella [name] and had PFTs and had a CXR.  He was treated with more antibiotics and steroids.  Around this time he saw Dr. Eddie [name] with Endocrinology who referred him here.  He saw his PCP on March the 8 who ordered a CT scan and he was referred to me for further evaluation.    He says that he is still cough. It is productive of white mucus.  No fever, no chills, no chest pain.  He has lost 24 pounds since December.  He is taking Victoza.  He is still smoking cigarettes.  He has smoked up to 1ppd, for the last 10 years he has smoked 1/2 pack per day.  He has smoked since his early 20's.    He tells me that in the past he would typically get bronchitis in the fall that would last for 6 weeks or more.   He uses albuterol on an as needed, but he uses it rarely.  Recently when he took it he felt that it made him cough more and feel like he had a spasm in his chest.    Past Medical History:  Diagnosis Date  . BPH (benign prostatic hyperplasia)   . Chronic kidney disease   . COPD (chronic obstructive pulmonary disease) ([place])   . Diabetes mellitus without complication ([place])   . Hyperlipidemia   . Hypertension   . Nephrolithiasis   . Seizures ([place])      Family History  Problem Relation Age of Onset  . Diabetes  Mother   . Diabetes Maternal Grandmother   . Diabetes Maternal Grandfather   . Lung cancer Father   . Emphysema Paternal Grandfather      Social History   Social History  . Marital status: Married    Spouse name: N/A  . Number of children: N/A  . Years of education: N/A   Occupational History  . Not on file.   Social History Main Topics  . Smoking status: Current Every Day Smoker    Packs/day: 1.00    Years: 40.00    Types: Cigarettes  . Smokeless tobacco: Never Used     Comment: down to 0.5 ppd  . Alcohol use 4.8 oz/week    8 Cans of beer per week     Comment: beer or wine   . Drug use: No  . Sexual activity: Not on file   Other Topics Concern  . Not on file   Social History Narrative  . No narrative on file     No Known Allergies   Outpatient Medications Prior to Visit  Medication Sig Dispense Refill  . atorvastatin (LIPITOR) 10 MG tablet Take 10  mg by mouth daily.    . Cholecalciferol (VITAMIN D3) 5000 units TABS Take 1 tablet by mouth daily.    . fluticasone (FLONASE) 50 MCG/ACT nasal spray Place 2 sprays into both nostrils daily as needed. Reported on 05/05/2015    . glipiZIDE (GLUCOTROL XL) 10 MG 24 hr tablet Take 10 mg by mouth daily.    . hydrALAZINE (APRESOLINE) 50 MG tablet Take 1 tablet by mouth daily.    [name] levETIRAcetam (KEPPRA) 500 MG tablet Take 500 mg by mouth daily.    [name] losartan-hydrochlorothiazide (HYZAAR) 100-12.5 MG tablet Take 1 tablet by mouth daily.    . metoprolol succinate (TOPROL-XL) 50 MG 24 hr tablet Take 1 tablet by mouth daily.    . insulin lispro (HUMALOG) 100 UNIT/ML injection VGo - 40; 5 clicks with each meal; 3 clicks for snacks; if over 416 take 2 clicks    . Multiple Vitamins-Minerals (MULTIVITAMIN MEN PO) Take 1 tablet by mouth daily.    . nicotine (RA NICOTINE) 21 mg/24hr patch Place 1 patch onto the skin daily. Reported on 05/05/2015     No facility-administered medications prior to visit.       Review of Systems    Constitutional: Negative for fever and unexpected weight change.  HENT: Positive for congestion and postnasal drip. Negative for dental problem, ear pain, nosebleeds, rhinorrhea, sinus pressure, sneezing, sore throat and trouble swallowing.   Eyes: Negative for redness and itching.  Respiratory: Positive for cough and shortness of breath. Negative for chest tightness and wheezing.   Cardiovascular: Negative for palpitations and leg swelling.  Gastrointestinal: Negative for nausea and vomiting.  Genitourinary: Negative for dysuria.  Musculoskeletal: Negative for joint swelling.  Skin: Negative for rash.  Neurological: Negative for headaches.  Hematological: Does not bruise/bleed easily.  Psychiatric/Behavioral: Negative for dysphoric mood. The patient is not nervous/anxious.        Objective:   Physical Exam Vitals:   05/24/16 1511  BP: 126/66  Pulse: 85  SpO2: 97%  Weight: 211 lb (95.7 kg)  Height: 6' (1.829 m)   RA  Gen: well appearing, no acute distress HENT: NCAT, OP clear, neck supple without masses Eyes: PERRL, EOMi Lymph: no cervical lymphadenopathy PULM: Diminished anterior R lung, clear otherwise B CV: RRR, no mgr, no JVD GI: BS+, soft, nontender, no hsm Derm: no rash or skin breakdown MSK: normal bulk and tone Neuro: A&Ox4, CN II-XII intact, strength 5/5 in all 4 extremities Psyche: normal mood and affect   Records reviewed from his visit with endocrinology, Dr. Eddie [name], he is cared for by her for type 2 diabetes.  CT chest images independently reviewed, see discussion below     Assessment & Plan:  COPD with chronic bronchitis ([place]) He carries a diagnosis of COPD considering his extensive smoking history and persistent symptoms. We need to get spirometry. We will do this after the bronchoscopy.  Collapse of right lung I have independently reviewed the images from his CT chest which shows isolated collapse of the right middle lobe with no surrounding obvious  pathology or mediastinal lymphadenopathy. I'm hopeful that this represents just thick mucus or airway thickening and not malignancy. However, considering his smoking history he is at increased risk for bronchogenic carcinoma.  Plan: Bronchoscopy tomorrow morning at [facility] Nothing by mouth after midnight CBC today  Cigarette smoker Counseled to quit    Current Outpatient Prescriptions:  .  Amylase-Lipase-Protease (CREON 10 PO), Take by mouth daily as needed., Disp: , Rfl:  .  atorvastatin (LIPITOR) 10 MG tablet, Take 10 mg by mouth daily., Disp: , Rfl:  .  Cholecalciferol (VITAMIN D3) 5000 units TABS, Take 1 tablet by mouth daily., Disp: , Rfl:  .  escitalopram (LEXAPRO) 10 MG tablet, Take 10 mg by mouth daily., Disp: , Rfl:  .  fluticasone (FLONASE) 50 MCG/ACT nasal spray, Place 2 sprays into both nostrils daily as needed. Reported on 05/05/2015, Disp: , Rfl:  .  glipiZIDE (GLUCOTROL XL) 10 MG 24 hr tablet, Take 10 mg by mouth daily., Disp: , Rfl:  .  hydrALAZINE (APRESOLINE) 50 MG tablet, Take 1 tablet by mouth daily., Disp: , Rfl:  .  levETIRAcetam (KEPPRA) 500 MG tablet, Take 500 mg by mouth daily., Disp: , Rfl:  .  liraglutide (VICTOZA) 18 MG/3ML SOPN, Inject 1.8 mg into the skin at bedtime., Disp: , Rfl:  .  losartan-hydrochlorothiazide (HYZAAR) 100-12.5 MG tablet, Take 1 tablet by mouth daily., Disp: , Rfl:  .  metoprolol succinate (TOPROL-XL) 50 MG 24 hr tablet, Take 1 tablet by mouth daily., Disp: , Rfl:  .  sodium bicarbonate 325 MG tablet, Take 325 mg by mouth 2 (two) times daily., Disp: , Rfl:

## 2016-05-25 ENCOUNTER — Ambulatory Visit (HOSPITAL_COMMUNITY)
Admission: RE | Admit: 2016-05-25 | Discharge: 2016-05-25 | Disposition: A | Discharge status: Home / Self Care | Payer: Medicare Other | Source: Ambulatory Visit | Attending: Pulmonary Disease | Admitting: Pulmonary Disease

## 2016-05-25 ENCOUNTER — Encounter (HOSPITAL_COMMUNITY)
Admission: AD | Disposition: A | Discharge status: Home / Self Care | Payer: Self-pay | Source: Ambulatory Visit | Attending: Pulmonary Disease

## 2016-05-25 ENCOUNTER — Ambulatory Visit (HOSPITAL_COMMUNITY)
Admission: AD | Admit: 2016-05-25 | Discharge: 2016-05-25 | Disposition: A | Discharge status: Home / Self Care | Payer: Medicare Other | Source: Ambulatory Visit | Attending: Pulmonary Disease | Admitting: Pulmonary Disease

## 2016-05-25 ENCOUNTER — Encounter (HOSPITAL_COMMUNITY): Payer: Self-pay | Admitting: Respiratory Therapy

## 2016-05-25 DIAGNOSIS — R0600 Dyspnea, unspecified: Secondary | ICD-10-CM | POA: Diagnosis not present

## 2016-05-25 DIAGNOSIS — E785 Hyperlipidemia, unspecified: Secondary | ICD-10-CM | POA: Insufficient documentation

## 2016-05-25 DIAGNOSIS — J9811 Atelectasis: Secondary | ICD-10-CM | POA: Insufficient documentation

## 2016-05-25 DIAGNOSIS — J988 Other specified respiratory disorders: Secondary | ICD-10-CM | POA: Diagnosis not present

## 2016-05-25 DIAGNOSIS — Z794 Long term (current) use of insulin: Secondary | ICD-10-CM | POA: Diagnosis not present

## 2016-05-25 DIAGNOSIS — J440 Chronic obstructive pulmonary disease with acute lower respiratory infection: Secondary | ICD-10-CM | POA: Diagnosis not present

## 2016-05-25 DIAGNOSIS — F17210 Nicotine dependence, cigarettes, uncomplicated: Secondary | ICD-10-CM | POA: Diagnosis not present

## 2016-05-25 DIAGNOSIS — I129 Hypertensive chronic kidney disease with stage 1 through stage 4 chronic kidney disease, or unspecified chronic kidney disease: Secondary | ICD-10-CM | POA: Insufficient documentation

## 2016-05-25 DIAGNOSIS — N400 Enlarged prostate without lower urinary tract symptoms: Secondary | ICD-10-CM | POA: Insufficient documentation

## 2016-05-25 DIAGNOSIS — E1122 Type 2 diabetes mellitus with diabetic chronic kidney disease: Secondary | ICD-10-CM | POA: Insufficient documentation

## 2016-05-25 DIAGNOSIS — R569 Unspecified convulsions: Secondary | ICD-10-CM | POA: Insufficient documentation

## 2016-05-25 DIAGNOSIS — J449 Chronic obstructive pulmonary disease, unspecified: Secondary | ICD-10-CM

## 2016-05-25 DIAGNOSIS — N189 Chronic kidney disease, unspecified: Secondary | ICD-10-CM | POA: Diagnosis not present

## 2016-05-25 HISTORY — PX: VIDEO BRONCHOSCOPY: SHX5072

## 2016-05-25 LAB — GLUCOSE, CAPILLARY: Glucose-Capillary: 193 mg/dL — ABNORMAL HIGH (ref 65–99)

## 2016-05-25 SURGERY — VIDEO BRONCHOSCOPY WITHOUT FLUORO
Anesthesia: Moderate Sedation | Laterality: Bilateral

## 2016-05-25 MED ORDER — LIDOCAINE HCL (PF) 1 % IJ SOLN
INTRAMUSCULAR | Status: DC | PRN
  Administered 2016-05-25: 6 mL

## 2016-05-25 MED ORDER — FENTANYL CITRATE (PF) 100 MCG/2ML IJ SOLN
INTRAMUSCULAR | Status: DC | PRN
  Administered 2016-05-25: 25 ug via INTRAVENOUS
  Administered 2016-05-25: 50 ug via INTRAVENOUS

## 2016-05-25 MED ORDER — MIDAZOLAM HCL 5 MG/ML IJ SOLN
INTRAMUSCULAR | Status: AC
  Filled 2016-05-25: qty 2

## 2016-05-25 MED ORDER — SODIUM CHLORIDE 0.9 % IV SOLN
Freq: Once | INTRAVENOUS | Status: AC
  Administered 2016-05-25: 07:00:00 via INTRAVENOUS

## 2016-05-25 MED ORDER — FENTANYL CITRATE (PF) 100 MCG/2ML IJ SOLN
INTRAMUSCULAR | Status: AC
  Filled 2016-05-25: qty 4

## 2016-05-25 MED ORDER — LIDOCAINE HCL 2 % EX GEL
CUTANEOUS | Status: DC | PRN
  Administered 2016-05-25: 1

## 2016-05-25 MED ORDER — MIDAZOLAM HCL 10 MG/2ML IJ SOLN
INTRAMUSCULAR | Status: DC | PRN
  Administered 2016-05-25: 2 mg via INTRAVENOUS
  Administered 2016-05-25: 1 mg via INTRAVENOUS

## 2016-05-25 MED ORDER — PHENYLEPHRINE HCL 0.25 % NA SOLN
NASAL | Status: DC | PRN
  Administered 2016-05-25: 2 via NASAL

## 2016-05-25 NOTE — Progress Notes (Signed)
Video bronchoscopy performed.    Intervention bronchial brushing. Intervention bronchial washing.  No complications noted.  Will continue to monitor. 

## 2016-05-25 NOTE — Discharge Instructions (Signed)
Flexible Bronchoscopy, Care After These instructions give you information on caring for yourself after your procedure. Your doctor may also give you more specific instructions. Call your doctor if you have any problems or questions after your procedure. Follow these instructions at home:  Do not eat or drink anything for 2 hours after your procedure. If you try to eat or drink before the medicine wears off, food or drink could go into your lungs. You could also burn yourself.  After 2 hours have passed and when you can cough and gag normally, you may eat soft food and drink liquids slowly.  The day after the test, you may eat your normal diet.  You may do your normal activities.  Keep all doctor visits. Get help right away if:  You get more and more short of breath.  You get light-headed.  You feel like you are going to pass out (faint).  You have chest pain.  You have new problems that worry you.  You cough up more than a little blood.  You cough up more blood than before. This information is not intended to replace advice given to you by your health care provider. Make sure you discuss any questions you have with your health care provider. Document Released: 12/10/2008 Document Revised: 07/21/2015 Document Reviewed: 10/17/2012 Elsevier Interactive Patient Education  2017 [place] not eat or drink anything until 09:30 am on 05/25/2016.

## 2016-05-25 NOTE — H&P (Signed)
[facility]  HPI: [name] has struggled with cough, dyspnea and mucus production since 10/2015 and was found to have a right middle lobe collapse on CT chest.  He is here today for a bronchsocopy for further evaluation.  He feels well this morning.  Past Medical History:  Diagnosis Date  . BPH (benign prostatic hyperplasia)   . Chronic kidney disease   . COPD (chronic obstructive pulmonary disease) ([place])   . Diabetes mellitus without complication ([place])   . Hyperlipidemia   . Hypertension   . Nephrolithiasis   . Seizures ([place])      Family History  Problem Relation Age of Onset  . Diabetes Mother   . Diabetes Maternal Grandmother   . Diabetes Maternal Grandfather   . Lung cancer Father   . Emphysema Paternal Grandfather      Social History   Social History  . Marital status: Married    Spouse name: N/A  . Number of children: N/A  . Years of education: N/A   Occupational History  . Not on file.   Social History Main Topics  . Smoking status: Current Every Day Smoker    Packs/day: 1.00    Years: 40.00    Types: Cigarettes  . Smokeless tobacco: Never Used     Comment: down to 0.5 ppd  . Alcohol use 4.8 oz/week    8 Cans of beer per week     Comment: beer or wine   . Drug use: No  . Sexual activity: Not on file   Other Topics Concern  . Not on file   Social History Narrative  . No narrative on file     No Known Allergies   @[email or web address] @ Vitals:   05/25/16 0715 05/25/16 0720 05/25/16 0725 05/25/16 0730  BP: (!) 154/68 (!) 158/76 (!) 162/84   Pulse: 79 77 78 80  Resp: 13 13 13 13   Temp:      TempSrc:      SpO2: 98% 97% 97% 98%  Weight:      Height:       RA  Gen: well appearing HENT: OP clear, TM's clear, neck supple PULM: CTA B, normal percussion CV: RRR, no mgr, trace edema GI: BS+, soft, nontender Derm: no cyanosis or rash Psyche: normal mood and affect  CT images reviewed: right middle lobe collapse  CBC    Component Value Date/Time   WBC  9.6 05/24/2016 1558   RBC 4.28 05/24/2016 1558   HGB 13.4 05/24/2016 1558   HGB 14.2 08/08/2013 1947   HCT 39.2 05/24/2016 1558   HCT 42.1 08/08/2013 1947   PLT 196.0 05/24/2016 1558   PLT 144 (L) 08/08/2013 1947   MCV 91.5 05/24/2016 1558   MCV 92 08/08/2013 1947   MCH 30.9 08/08/2013 1947   MCHC 34.1 05/24/2016 1558   RDW 13.9 05/24/2016 1558   RDW 12.9 08/08/2013 1947   LYMPHSABS 1.7 05/24/2016 1558   MONOABS 0.5 05/24/2016 1558   EOSABS 0.1 05/24/2016 1558   BASOSABS 0.0 05/24/2016 1558   Impression/Plan  Right middle lobe collapse in a smoker: hopefully just mucus, could be malignancy or other obstruction.  Plan bronchoscopy, he was informed of the risks and benefits and is willing to proceed.  Roselie Awkward, MD [facility] Pager: [phone] Cell: [phone] After 3pm or if no response, call [phone]

## 2016-05-25 NOTE — Op Note (Signed)
[facility] Cardiopulmonary Patient Name: Kyle [name] Date: 05/25/2016 MRN: [id number] Attending MD: Juanito Doom , MD Date of Birth: [birth year] CSN: Finalized Age: 66 Admit Type: Outpatient Gender: Male Procedure:            Bronchoscopy Indications:          Atelectasis of the right middle lobe Providers:            Nathaneil Canary B. [place] Bells, MD, Cherre Huger RRT, RCP, Phillis Knack                        RRT, RCP Referring MD:          Medicines:            Midazolam 3 mg IV, Fentanyl 75 mcg IV, Lidocaine 2%                        applied to cords 4 mL, Lidocaine 1% subglottic space 9                        mL Complications:        No immediate complications Estimated Blood Loss: Estimated blood loss was minimal. Procedure:            Pre-Anesthesia Assessment:                       - A History and Physical has been performed. Patient                        meds and allergies have been reviewed. The risks and                        benefits of the procedure and the sedation options and                        risks were discussed with the patient. All questions                        were answered and informed consent was obtained.                        Patient identification and proposed procedure were                        verified prior to the procedure by the physician and                        the technician in the procedure room. Mental Status                        Examination: normal. Airway Examination: normal                        oropharyngeal airway. Respiratory Examination: clear to                        auscultation. CV Examination: normal. ASA Grade                        Assessment: II - A patient with mild systemic disease.  After reviewing the risks and benefits, the patient was                        deemed in satisfactory condition to undergo the                        procedure. The anesthesia plan was to use  moderate                        sedation / analgesia (conscious sedation). Immediately                        prior to administration of medications, the patient was                        re-assessed for adequacy to receive sedatives. The                        heart rate, respiratory rate, oxygen saturations, blood                        pressure, adequacy of pulmonary ventilation, and                        response to care were monitored throughout the                        procedure. The physical status of the patient was                        re-assessed after the procedure.                       After obtaining informed consent, the bronchoscope was                        passed under direct vision. Throughout the procedure,                        the patient's blood pressure, pulse, and oxygen                        saturations were monitored continuously. the [id number]                        [id number] scope was introduced through the right nostril                        and advanced to the tracheobronchial tree. The                        procedure was accomplished without difficulty. The                        patient tolerated the procedure well. The total                        duration of the procedure was 10 minutes. Scope In: 3:78:58 AM Scope Out: 7:54:03 AM Findings:      The patient's condition was assessed prior to discharge.  The nasopharynx/oropharynx appears normal. The larynx appears normal.       The vocal cords appear normal. The subglottic space is normal. The       trachea is of normal caliber. The carina is sharp. The tracheobronchial       tree of the left lung was examined to at least the first subsegmental       level. Bronchial mucosa and anatomy in the left lung are normal; there       are no endobronchial lesions, and no secretions.      Right Lung Abnormalities: An area of acutely inflamed and friable mucosa       was found in the right middle lobe.  There was minor bleeding noted when       the scope contacted the airway wall and with the BAL and brushing. There       was no mass or tumor evident in the right middle lobe. Edema was found       in the right middle lobe. Narrowing was found in the right middle lobe.       The lesion has a benign appearance. The airway is moderately narrowed.       The lesion was successfully traversed. BAL was performed in the right       middle lobe of the lung and sent for routine cytology and bacterial, AFB       and fungal analysis. 60 mL of fluid were instilled. 20 mL were returned.       The return was blood-tinged and cloudy. There were no mucoid plugs in       the return fluid. Brushings were obtained in the right middle lobe and       sent for routine cytology. Two samples were obtained. Impression:           - Atelectasis of the right middle lobe                       - The patient's condition upon discharge was good.                       - The left lung was normal.                       - Acute mucosal inflammation was visualized in the                        right middle lobe.                       - Edema was present in the right middle lobe.                       - A narrowing was found in the right middle lobe. The                        lesion has a benign appearance.                       - Bronchoalveolar lavage was performed.                       - Brushings were obtained.                       -  Bronchitic changes were found. Moderate Sedation:      Moderate (conscious) sedation was personally administered by the       endoscopist. The following parameters were monitored: oxygen saturation,       heart rate, blood pressure, and response to care. Total physician       intraservice time was 16 minutes. Recommendation:       - Await BAL and brushing results. Procedure Code(s):    --- Professional ---                       [phone], Bronchoscopy, rigid or flexible, including                         fluoroscopic guidance, when performed; with bronchial                        alveolar lavage                       [phone], Bronchoscopy, rigid or flexible, including                        fluoroscopic guidance, when performed; with brushing or                        protected brushings                       [id number], Moderate sedation services provided by the same                        physician or other qualified health care professional                        performing the diagnostic or therapeutic service that                        the sedation supports, requiring the presence of an                        independent trained observer to assist in the                        monitoring of the patient's level of consciousness and                        physiological status; initial 15 minutes of                        intraservice time, patient age 43 years or older Diagnosis Code(s):    --- Professional ---                       J98.11, Atelectasis                       J18.9, Pneumonia, unspecified organism                       R09.89, Other specified symptoms and signs involving                        the  circulatory and respiratory systems                       J98.4, Other disorders of lung                       J98.09, Other diseases of bronchus, not elsewhere                        classified CPT copyright 2016 American Medical Association. All rights reserved. The codes documented in this report are preliminary and upon coder review may  be revised to meet current compliance requirements. Norlene Campbell, MD Juanito Doom, MD 05/25/2016 8:08:35 AM This report has been signed electronically. Number of Addenda: 0

## 2016-05-26 LAB — ACID FAST SMEAR (AFB, MYCOBACTERIA): Acid Fast Smear: NEGATIVE

## 2016-05-27 LAB — CULTURE, RESPIRATORY
Culture: NORMAL
Special Requests: NORMAL

## 2016-05-28 ENCOUNTER — Encounter (HOSPITAL_COMMUNITY): Payer: Self-pay | Admitting: Pulmonary Disease

## 2016-06-12 ENCOUNTER — Encounter: Payer: Self-pay | Admitting: Adult Health

## 2016-06-12 ENCOUNTER — Ambulatory Visit (INDEPENDENT_AMBULATORY_CARE_PROVIDER_SITE_OTHER): Payer: Medicare Other | Admitting: Adult Health

## 2016-06-12 ENCOUNTER — Ambulatory Visit (INDEPENDENT_AMBULATORY_CARE_PROVIDER_SITE_OTHER)
Admission: RE | Admit: 2016-06-12 | Discharge: 2016-06-12 | Disposition: A | Discharge status: Home / Self Care | Payer: Medicare Other | Source: Ambulatory Visit | Attending: Adult Health | Admitting: Adult Health

## 2016-06-12 VITALS — BP 138/76 | HR 75 | Ht 72.0 in | Wt 208.8 lb

## 2016-06-12 DIAGNOSIS — J9811 Atelectasis: Secondary | ICD-10-CM

## 2016-06-12 DIAGNOSIS — F17210 Nicotine dependence, cigarettes, uncomplicated: Secondary | ICD-10-CM

## 2016-06-12 DIAGNOSIS — J449 Chronic obstructive pulmonary disease, unspecified: Secondary | ICD-10-CM

## 2016-06-12 DIAGNOSIS — R911 Solitary pulmonary nodule: Secondary | ICD-10-CM | POA: Diagnosis not present

## 2016-06-12 NOTE — Assessment & Plan Note (Signed)
Congratulated on cessation

## 2016-06-12 NOTE — Assessment & Plan Note (Signed)
Appears resolved on follow up CXR today  FOB w/ neg cytolgy/path for malignant cells  cx neg to date.

## 2016-06-12 NOTE — Assessment & Plan Note (Signed)
Probable COPD  Records for PFT results  Cont on current regimen  follow up 3 months

## 2016-06-12 NOTE — Progress Notes (Signed)
@Patient  ID: Kyle [name], male    DOB: 12/01/1950, 66 y.o.   MRN: [id number]  Chief Complaint  Patient presents with  . Follow-up    LUng nodule     Referring provider: Idelle Crouch, MD  HPI: 66 year old male, active smoker, seen for pulmonary consult 05/24/2016 for COPD and abnormal CT chest.   06/12/2016 Follow up : COPD /Lung nodule /FOB  Patient returns for a two-week follow-up. Patient was seen for pulmonary consult 05/24/2016 for COPD and abnormal CT chest. Patient says that he had recurrent bronchitic symptoms in the fall of 2017. He also fell and hurt his right ribs around that time as well. Over the last 6 months. He been having increased cough, shortness of breath. CT chest was done 05/09/2016 that showed complete right middle lobe collapse. Right middle lobe bronchus appeared occluded. Patient was set up for a bronchoscopy which was done on 05/25/2016. Path and cytology were negative for malignant cells. Cultures have been negative to date..  Bronchoscopy noted a area of inflammation and edema along the right middle lobe area had a benign appearance. Narrowing was found in the right middle lobe. Chest x-ray today shows resolution of right middle lobe collapse.  He denies any chest pain, orthopnea, PND, or increased leg swelling. Patient says he did have primary function test done last year. Results from, [facility] pulmonology were requested today.  Has quit smoking  No Known Allergies  Immunization History  Administered Date(s) Administered  . Influenza, High Dose Seasonal PF 12/25/2015  . Pneumococcal Conjugate-13 03/26/2016    Past Medical History:  Diagnosis Date  . BPH (benign prostatic hyperplasia)   . Chronic kidney disease   . COPD (chronic obstructive pulmonary disease) ([place])   . Diabetes mellitus without complication ([place])   . Hyperlipidemia   . Hypertension   . Nephrolithiasis   . Seizures ([place])     Tobacco History: History  Smoking Status    . Current Every Day Smoker  . Packs/day: 1.00  . Years: 40.00  . Types: Cigarettes  Smokeless Tobacco  . Never Used    Comment: down to 0.5 ppd   Ready to quit: Yes Counseling given: Yes   Outpatient Encounter Prescriptions as of 06/12/2016  Medication Sig  . Amylase-Lipase-Protease (CREON 10 PO) Take by mouth daily as needed.  [name] atorvastatin (LIPITOR) 10 MG tablet Take 10 mg by mouth daily.  . Cholecalciferol (VITAMIN D3) 5000 units TABS Take 1 tablet by mouth daily.  [name] escitalopram (LEXAPRO) 10 MG tablet Take 10 mg by mouth daily.  . fluticasone (FLONASE) 50 MCG/ACT nasal spray Place 2 sprays into both nostrils daily as needed. Reported on 05/05/2015  . glipiZIDE (GLUCOTROL XL) 10 MG 24 hr tablet Take 10 mg by mouth daily.  . hydrALAZINE (APRESOLINE) 50 MG tablet Take 1 tablet by mouth daily.  [name] levETIRAcetam (KEPPRA) 500 MG tablet Take 500 mg by mouth daily.  [name] liraglutide (VICTOZA) 18 MG/3ML SOPN Inject 1.8 mg into the skin at bedtime.  . sodium bicarbonate 325 MG tablet Take 325 mg by mouth 2 (two) times daily.  [name] losartan-hydrochlorothiazide (HYZAAR) 100-12.5 MG tablet Take 1 tablet by mouth daily.  . metoprolol succinate (TOPROL-XL) 50 MG 24 hr tablet Take 1 tablet by mouth daily.   No facility-administered encounter medications on file as of 06/12/2016.      Review of Systems  Constitutional:   No    night sweats,  Fevers, chills, fatigue, or  lassitude.  HEENT:  No headaches,  Difficulty swallowing,  Tooth/dental problems, or  Sore throat,                No sneezing, itching, ear ache, nasal congestion, post nasal drip,   CV:  No chest pain,  Orthopnea, PND, swelling in lower extremities, anasarca, dizziness, palpitations, syncope.   GI  No heartburn, indigestion, abdominal pain, nausea, vomiting, diarrhea, change in bowel habits, loss of appetite, bloody stools.   Resp: No shortness of breath with exertion or at rest.  No excess mucus, no productive cough,  No  non-productive cough,  No coughing up of blood.  No change in color of mucus.  No wheezing.  No chest wall deformity  Skin: no rash or lesions.  GU: no dysuria, change in color of urine, no urgency or frequency.  No flank pain, no hematuria   MS:  No joint pain or swelling.  No decreased range of motion.  No back pain.    Physical Exam  BP 138/76 (BP Location: Left Arm, Cuff Size: Normal)   Pulse 75   Ht 6' (1.829 m)   Wt 208 lb 12.8 oz (94.7 kg)   SpO2 96%   BMI 28.32 kg/m   GEN: A/Ox3; pleasant , NAD, well nourished    HEENT:  [place]/AT,  EACs-clear, TMs-wnl, NOSE-clear, THROAT-clear, no lesions, no postnasal drip or exudate noted.   NECK:  Supple w/ fair ROM; no JVD; normal carotid impulses w/o bruits; no thyromegaly or nodules palpated; no lymphadenopathy.    RESP  Clear  P & A; w/o, wheezes/ rales/ or rhonchi. no accessory muscle use, no dullness to percussion  CARD:  RRR, no m/r/g, no peripheral edema, pulses intact, no cyanosis or clubbing.  GI:   Soft & nt; nml bowel sounds; no organomegaly or masses detected.   Musco: Warm bil, no deformities or joint swelling noted.   Neuro: alert, no focal deficits noted.    Skin: Warm, no lesions or rashes  .  Lab Results:  CBC    Component Value Date/Time   WBC 9.6 05/24/2016 1558   RBC 4.28 05/24/2016 1558   HGB 13.4 05/24/2016 1558   HGB 14.2 08/08/2013 1947   HCT 39.2 05/24/2016 1558   HCT 42.1 08/08/2013 1947   PLT 196.0 05/24/2016 1558   PLT 144 (L) 08/08/2013 1947   MCV 91.5 05/24/2016 1558   MCV 92 08/08/2013 1947   MCH 30.9 08/08/2013 1947   MCHC 34.1 05/24/2016 1558   RDW 13.9 05/24/2016 1558   RDW 12.9 08/08/2013 1947   LYMPHSABS 1.7 05/24/2016 1558   MONOABS 0.5 05/24/2016 1558   EOSABS 0.1 05/24/2016 1558   BASOSABS 0.0 05/24/2016 1558    BMET    Component Value Date/Time   NA 138 08/08/2013 1947   K 3.9 08/08/2013 1947   CL 107 08/08/2013 1947   CO2 21 08/08/2013 1947   GLUCOSE 100 (H)  08/08/2013 1947   BUN 32 (H) 08/08/2013 1947   CREATININE 2.04 (H) 08/08/2013 1947   CALCIUM 9.1 08/08/2013 1947   GFRNONAA 33 (L) 08/08/2013 1947   GFRAA 38 (L) 08/08/2013 1947    BNP No results found for: BNP  ProBNP No results found for: PROBNP  Imaging: Dg Chest 2 View  Result Date: 06/12/2016 CLINICAL DATA:  History of bronchoscopy/ right lung collapse. History of COPD. EXAM: CHEST  2 VIEW COMPARISON:  No prior . FINDINGS: Mediastinum and hilar structures are normal. Heart size normal. No focal infiltrate. No significant  atelectasis. No pleural effusion or pneumothorax. No acute bony abnormality. IMPRESSION: No acute cardiopulmonary disease. Electronically Signed   By: Marcello Moores  Register   On: 06/12/2016 11:08     Assessment & Plan:   Collapse of right lung Appears resolved on follow up CXR today  FOB w/ neg cytolgy/path for malignant cells  cx neg to date.   Cigarette smoker Congratulated on cessation   Chronic obstructive pulmonary disease ([place]) Probable COPD  Records for PFT results  Cont on current regimen  follow up 3 months   Lung nodule 3 mm lung nodule nodule noted, follow up CT chest in 6 months .      Rexene Edison, NP 06/12/2016

## 2016-06-12 NOTE — Patient Instructions (Signed)
Great job on not smoking .  CT chest in Sept 2018 to follow lung nodule.  Follow up with Dr. Lake Bells in 3 months and As needed

## 2016-06-12 NOTE — Assessment & Plan Note (Addendum)
3 mm RLL lung nodule nodule noted, follow up CT chest in 6 months .

## 2016-06-13 NOTE — Progress Notes (Signed)
Reviewed, agree 

## 2016-06-22 LAB — FUNGUS CULTURE RESULT

## 2016-06-22 LAB — FUNGUS CULTURE WITH STAIN

## 2016-06-22 LAB — FUNGAL ORGANISM REFLEX

## 2016-07-02 NOTE — Progress Notes (Signed)
Reviewed, agree 

## 2016-07-07 LAB — ACID FAST CULTURE WITH REFLEXED SENSITIVITIES (MYCOBACTERIA): Acid Fast Culture: NEGATIVE

## 2016-07-27 ENCOUNTER — Emergency Department
Admission: EM | Admit: 2016-07-27 | Discharge: 2016-07-27 | Disposition: A | Discharge status: Home / Self Care | Payer: Medicare Other | Attending: Emergency Medicine | Admitting: Emergency Medicine

## 2016-07-27 ENCOUNTER — Encounter: Payer: Self-pay | Admitting: Emergency Medicine

## 2016-07-27 ENCOUNTER — Emergency Department: Payer: Medicare Other

## 2016-07-27 DIAGNOSIS — N183 Chronic kidney disease, stage 3 (moderate): Secondary | ICD-10-CM | POA: Insufficient documentation

## 2016-07-27 DIAGNOSIS — E1122 Type 2 diabetes mellitus with diabetic chronic kidney disease: Secondary | ICD-10-CM | POA: Diagnosis not present

## 2016-07-27 DIAGNOSIS — Z79899 Other long term (current) drug therapy: Secondary | ICD-10-CM | POA: Insufficient documentation

## 2016-07-27 DIAGNOSIS — I129 Hypertensive chronic kidney disease with stage 1 through stage 4 chronic kidney disease, or unspecified chronic kidney disease: Secondary | ICD-10-CM | POA: Insufficient documentation

## 2016-07-27 DIAGNOSIS — F17210 Nicotine dependence, cigarettes, uncomplicated: Secondary | ICD-10-CM | POA: Insufficient documentation

## 2016-07-27 DIAGNOSIS — Z794 Long term (current) use of insulin: Secondary | ICD-10-CM | POA: Diagnosis not present

## 2016-07-27 DIAGNOSIS — E1165 Type 2 diabetes mellitus with hyperglycemia: Secondary | ICD-10-CM | POA: Insufficient documentation

## 2016-07-27 DIAGNOSIS — J449 Chronic obstructive pulmonary disease, unspecified: Secondary | ICD-10-CM | POA: Insufficient documentation

## 2016-07-27 DIAGNOSIS — R739 Hyperglycemia, unspecified: Secondary | ICD-10-CM

## 2016-07-27 LAB — BASIC METABOLIC PANEL
Anion gap: 10 (ref 5–15)
Anion gap: 9 (ref 5–15)
BUN: 40 mg/dL — ABNORMAL HIGH (ref 6–20)
BUN: 38 mg/dL — ABNORMAL HIGH (ref 6–20)
CO2: 20 mmol/L — ABNORMAL LOW (ref 22–32)
CO2: 23 mmol/L (ref 22–32)
Calcium: 8.6 mg/dL — ABNORMAL LOW (ref 8.9–10.3)
Calcium: 8.3 mg/dL — ABNORMAL LOW (ref 8.9–10.3)
Chloride: 93 mmol/L — ABNORMAL LOW (ref 101–111)
Chloride: 101 mmol/L (ref 101–111)
Creatinine, Ser: 2.73 mg/dL — ABNORMAL HIGH (ref 0.61–1.24)
Creatinine, Ser: 2.46 mg/dL — ABNORMAL HIGH (ref 0.61–1.24)
GFR calc Af Amer: 26 mL/min — ABNORMAL LOW (ref >60)
GFR calc Af Amer: 29 mL/min — ABNORMAL LOW (ref >60)
GFR calc non Af Amer: 22 mL/min — ABNORMAL LOW (ref >60)
GFR calc non Af Amer: 25 mL/min — ABNORMAL LOW (ref >60)
Glucose, Bld: 638 mg/dL (ref 65–99)
Glucose, Bld: 270 mg/dL — ABNORMAL HIGH (ref 65–99)
Potassium: 4.1 mmol/L (ref 3.5–5.1)
Potassium: 4 mmol/L (ref 3.5–5.1)
Sodium: 123 mmol/L — ABNORMAL LOW (ref 135–145)
Sodium: 133 mmol/L — ABNORMAL LOW (ref 135–145)

## 2016-07-27 LAB — URINALYSIS, COMPLETE (UACMP) WITH MICROSCOPIC
Bacteria, UA: NONE SEEN
Bilirubin Urine: NEGATIVE
Glucose, UA: >=500 mg/dL — AB
Ketones, ur: NEGATIVE mg/dL
Leukocytes, UA: NEGATIVE
Nitrite: NEGATIVE
Protein, ur: >=300 mg/dL — AB
Specific Gravity, Urine: 1.021 (ref 1.005–1.030)
pH: 5 (ref 5.0–8.0)

## 2016-07-27 LAB — CBC
HCT: 35.1 % — ABNORMAL LOW (ref 40.0–52.0)
Hemoglobin: 12.5 g/dL — ABNORMAL LOW (ref 13.0–18.0)
MCH: 32 pg (ref 26.0–34.0)
MCHC: 35.6 g/dL (ref 32.0–36.0)
MCV: 89.9 fL (ref 80.0–100.0)
Platelets: 184 10*3/uL (ref 150–440)
RBC: 3.91 MIL/uL — ABNORMAL LOW (ref 4.40–5.90)
RDW: 12.7 % (ref 11.5–14.5)
WBC: 9.3 10*3/uL (ref 3.8–10.6)

## 2016-07-27 LAB — GLUCOSE, CAPILLARY
Glucose-Capillary: 594 mg/dL (ref 65–99)
Glucose-Capillary: 409 mg/dL — ABNORMAL HIGH (ref 65–99)
Glucose-Capillary: 290 mg/dL — ABNORMAL HIGH (ref 65–99)

## 2016-07-27 MED ORDER — SODIUM CHLORIDE 0.9 % IV SOLN
1000.0000 mL | Freq: Once | INTRAVENOUS | Status: AC
  Administered 2016-07-27: 1000 mL via INTRAVENOUS

## 2016-07-27 MED ORDER — SODIUM CHLORIDE 0.9 % IV BOLUS (SEPSIS)
1000.0000 mL | Freq: Once | INTRAVENOUS | Status: AC
  Administered 2016-07-27: 1000 mL via INTRAVENOUS

## 2016-07-27 NOTE — ED Notes (Signed)
Helped pt work out a cramp in his left ankle

## 2016-07-27 NOTE — ED Triage Notes (Signed)
Pt reports checked blood sugar at home was over 600.  He took 15 units of humalog at 345 after found out blood sugar high.  Pt has not been taking meds after falling last week because he has been hurting. Pain is to left ribs.  Would also like his ribs looked at.

## 2016-07-27 NOTE — ED Notes (Signed)
MD Kinner at bedside  

## 2016-07-27 NOTE — ED Provider Notes (Signed)
[facility] Emergency Department Provider Note   ____________________________________________    I have reviewed the triage vital signs and the nursing notes.   HISTORY  Chief Complaint Hyperglycemia     HPI Kyle [name] is a 66 y.o. male who presents with elevated blood glucose. Patient reports he has not been compliant with his insulin with meals nor his toes over the last 3 weeks approximately. He reports he has been focusing on a separate issue of a collapsed lung on the right which has now recovered. In addition he notes he fell one week ago and has left sided rib pain which has been moderate to severe. He has been taking pain medication at home for this. He denies cough fevers chills. No shortness of breath. Today he checked his blood glucose and was alarmed when it was critically high. No nausea or vomiting.   Past Medical History:  Diagnosis Date  . BPH (benign prostatic hyperplasia)   . Chronic kidney disease   . COPD (chronic obstructive pulmonary disease) ([place])   . Diabetes mellitus without complication ([place])   . Hyperlipidemia   . Hypertension   . Nephrolithiasis   . Seizures [facility])     Patient Active Problem List   Diagnosis Date Noted  . Lung nodule 06/12/2016  . Narrowing of airway   . Cigarette smoker 05/24/2016  . Collapse of right lung 05/24/2016  . COPD with chronic bronchitis ([place]) 05/24/2016  . Acne 05/20/2015  . Calculus of kidney 05/20/2015  . Chest pain, non-cardiac 05/20/2015  . Seizure ([place]) 05/20/2015  . Current tobacco use 05/20/2015  . Chronic kidney disease (CKD), stage III (moderate) 03/29/2015  . Type 2 diabetes mellitus ([place]) 03/15/2015  . Lipoma of shoulder 03/08/2014  . Other synovitis and tenosynovitis, right shoulder 03/08/2014  . Bursitis of elbow 02/15/2014  . Absolute anemia 08/08/2013  . 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 Back pain, chronic 08/08/2013  . Chronic obstructive pulmonary  disease ([place]) 08/08/2013  . Diabetes mellitus ([place]) 08/08/2013  . BP (high blood pressure) 08/08/2013  . HLD (hyperlipidemia) 08/08/2013  . Acne erythematosa 08/08/2013    Past Surgical History:  Procedure Laterality Date  . COLONOSCOPY    . COLONOSCOPY WITH PROPOFOL N/A 05/30/2015   Procedure: COLONOSCOPY WITH PROPOFOL;  Surgeon: Manya Silvas, MD;  Location: [facility] ENDOSCOPY;  Service: Endoscopy;  Laterality: N/A;  . kidney stone    . VARICOCELE EXCISION    . VIDEO BRONCHOSCOPY Bilateral 05/25/2016   Procedure: VIDEO BRONCHOSCOPY WITHOUT FLUORO;  Surgeon: Juanito Doom, MD;  Location: [facility] ENDOSCOPY;  Service: Cardiopulmonary;  Laterality: Bilateral;    Prior to Admission medications   Medication Sig Start Date End Date Taking? Authorizing Provider  atorvastatin (LIPITOR) 10 MG tablet Take 10 mg by mouth daily. 04/08/15  Yes [provider]  Cholecalciferol (VITAMIN D3) 5000 units TABS Take 1 tablet by mouth daily.   Yes [provider]  escitalopram (LEXAPRO) 10 MG tablet Take 10 mg by mouth daily.   Yes [provider]  glipiZIDE (GLUCOTROL XL) 10 MG 24 hr tablet Take 10 mg by mouth daily. 03/29/15  Yes [provider]  hydrALAZINE (APRESOLINE) 50 MG tablet Take 25 mg by mouth daily.  05/12/15  Yes [provider]  insulin lispro (HUMALOG) 100 UNIT/ML injection Inject 15 Units into the skin daily.  08/03/15  Yes [provider]  levETIRAcetam (KEPPRA) 500 MG tablet Take 500 mg by mouth daily. 06/17/14  Yes [provider]  liraglutide (VICTOZA) 18 MG/3ML SOPN Inject 1.8 mg into the skin at bedtime.   Yes [provider]  metoprolol succinate (TOPROL-XL) 50 MG 24 hr tablet Take 1 tablet by mouth daily. 03/15/15 07/27/16 Yes [provider]  sodium bicarbonate 325 MG tablet Take 325 mg by mouth 2 (two) times daily.   Yes [provider]  Amylase-Lipase-Protease (CREON 10 PO) Take by mouth daily as needed.     [provider]  fluticasone (FLONASE) 50 MCG/ACT nasal spray Place 2 sprays into both nostrils daily as needed. Reported on 05/05/2015 01/14/14   [provider]  losartan-hydrochlorothiazide (HYZAAR) 100-12.5 MG tablet Take 1 tablet by mouth daily. 07/01/14 05/24/16  [provider]     Allergies Patient has no known allergies.  Family History  Problem Relation Age of Onset  . Diabetes Mother   . Diabetes Maternal Grandmother   . Diabetes Maternal Grandfather   . Lung cancer Father   . Emphysema Paternal Grandfather     Social History Social History  Substance Use Topics  . Smoking status: Current Every Day Smoker    Packs/day: 1.00    Years: 40.00    Types: Cigarettes  . Smokeless tobacco: Never Used     Comment: down to 0.5 ppd  . Alcohol use 4.8 oz/week    8 Cans of beer per week     Comment: beer or wine     Review of Systems  Constitutional: No fever/chills Eyes: No visual changes.  ENT: No sore throat. Cardiovascular:Left-sided rib pain Respiratory: Denies shortness of breath. Gastrointestinal: No abdominal pain.  No nausea, no vomiting.   Genitourinary: Negative for dysuria. Musculoskeletal: Negative for back pain. Skin: Negative for rash. Neurological: Negative for headaches or weakness   ____________________________________________   PHYSICAL EXAM:  VITAL SIGNS: ED Triage Vitals  Enc Vitals Group     BP 07/27/16 1646 137/72     Pulse Rate 07/27/16 1646 95     Resp 07/27/16 1646 18     Temp 07/27/16 1646 98.6 F (37 C)     Temp Source 07/27/16 1646 Oral     SpO2 07/27/16 1646 97 %     Weight 07/27/16 1646 91.2 kg (201 lb)     Height 07/27/16 1646 1.829 m (6')     Head Circumference --      Peak Flow --      Pain Score 07/27/16 1645 6     Pain Loc --      Pain Edu? --      Excl. in [place]? --     Constitutional: Alert and oriented. No acute distress. Pleasant and interactive Eyes: Conjunctivae are normal.  Head:  Atraumatic. Nose: No congestion/rhinnorhea. Mouth/Throat: Mucous membranes are moist.   Neck:  Painless ROM Cardiovascular: Normal rate, regular rhythm. Grossly normal heart sounds.  Good peripheral circulation.Tenderness to palpation left lateral central chest Respiratory: Normal respiratory effort.  No retractions. Lungs CTAB. [name]  Musculoskeletal: No lower extremity tenderness nor edema.  Warm and well perfused Neurologic:  Normal speech and language. No gross focal neurologic deficits are appreciated.  Skin:  Skin is warm, dry and intact. No rash noted. Psychiatric: Mood and affect are normal. Speech and behavior are normal.  ____________________________________________   LABS (all labs ordered are listed, but only abnormal results are displayed)  Labs Reviewed  BASIC METABOLIC PANEL - Abnormal; Notable for the following:       Result Value   Sodium  123 (*)    Chloride 93 (*)    CO2 20 (*)    Glucose, Bld 638 (*)    BUN 40 (*)    Creatinine, Ser 2.73 (*)    Calcium 8.6 (*)    GFR calc non Af Amer 22 (*)    GFR calc Af Amer 26 (*)    All other components within normal limits  CBC - Abnormal; Notable for the following:    RBC 3.91 (*)    Hemoglobin 12.5 (*)    HCT 35.1 (*)    All other components within normal limits  URINALYSIS, COMPLETE (UACMP) WITH MICROSCOPIC - Abnormal; Notable for the following:    Color, Urine YELLOW (*)    APPearance CLEAR (*)    Glucose, UA >=500 (*)    Hgb urine dipstick SMALL (*)    Protein, ur >=300 (*)    Squamous Epithelial / LPF 0-5 (*)    All other components within normal limits  GLUCOSE, CAPILLARY - Abnormal; Notable for the following:    Glucose-Capillary 594 (*)    All other components within normal limits  GLUCOSE, CAPILLARY - Abnormal; Notable for the following:    Glucose-Capillary 409 (*)    All other components within normal limits  GLUCOSE, CAPILLARY - Abnormal; Notable for the following:    Glucose-Capillary 290 (*)     All other components within normal limits  BASIC METABOLIC PANEL - Abnormal; Notable for the following:    Sodium 133 (*)    Glucose, Bld 270 (*)    BUN 38 (*)    Creatinine, Ser 2.46 (*)    Calcium 8.3 (*)    GFR calc non Af Amer 25 (*)    GFR calc Af Amer 29 (*)    All other components within normal limits  CBG MONITORING, ED  CBG MONITORING, ED   ____________________________________________  EKG  None ____________________________________________  RADIOLOGY  Rib x-ray demonstrates left 6 rib fracture ____________________________________________   PROCEDURES  Procedure(s) performed: No    Critical Care performed:No ____________________________________________   INITIAL IMPRESSION / ASSESSMENT AND PLAN / ED COURSE  Pertinent labs & imaging results that were available during my care of the patient were reviewed by me and considered in my medical decision making (see chart for details).  Lab work consistent with elevated glucose but not consistent with DKA. Patient has a history of chronic kidney disease and falls with nephrology. IV fluids given in triage and lower glucose to approximately 400. Patient did take insulin at approximately 4:30. We will give additional fluids and recheck blood glucose  Patient's repeat BMp is significantly improved. Glucose is improved. Appropriate for discharge at this time. Recommend to restarting diabetic medications    ____________________________________________   FINAL CLINICAL IMPRESSION(S) / ED DIAGNOSES  Final diagnoses:  Hyperglycemia      NEW MEDICATIONS STARTED DURING THIS VISIT:  Discharge Medication List as of 07/27/2016  9:41 PM       Note:  This document was prepared using Dragon voice recognition software and may include unintentional dictation errors.    Lavonia Drafts, MD 07/27/16 2218

## 2016-07-27 NOTE — ED Notes (Signed)
Pt. Going home with friend 

## 2016-07-27 NOTE — ED Notes (Signed)
Pt ambulated to toilet. 

## 2016-07-27 NOTE — ED Notes (Signed)
Pt BS taken = 290

## 2016-07-27 NOTE — ED Notes (Signed)
Pt. And pt. Family state pt. Has not taken insulin coverage this week.  Pt. States he fell about two weeks ago landing on lt. Side.  Pt. States he has only taken pain medication this past week.  Pt. States they checked BS this a.m and glucometer read over 600.  Pt. States he was feeling dizzy this a.m.

## 2016-09-18 ENCOUNTER — Ambulatory Visit (INDEPENDENT_AMBULATORY_CARE_PROVIDER_SITE_OTHER): Payer: Medicare Other | Admitting: Pulmonary Disease

## 2016-09-18 ENCOUNTER — Encounter: Payer: Self-pay | Admitting: Pulmonary Disease

## 2016-09-18 VITALS — BP 134/68 | HR 90 | Ht 72.0 in | Wt 207.0 lb

## 2016-09-18 DIAGNOSIS — J9811 Atelectasis: Secondary | ICD-10-CM

## 2016-09-18 DIAGNOSIS — F17210 Nicotine dependence, cigarettes, uncomplicated: Secondary | ICD-10-CM

## 2016-09-18 DIAGNOSIS — J4489 Other specified chronic obstructive pulmonary disease: Secondary | ICD-10-CM

## 2016-09-18 DIAGNOSIS — Z23 Encounter for immunization: Secondary | ICD-10-CM | POA: Diagnosis not present

## 2016-09-18 DIAGNOSIS — J449 Chronic obstructive pulmonary disease, unspecified: Secondary | ICD-10-CM | POA: Diagnosis not present

## 2016-09-18 NOTE — Patient Instructions (Signed)
Tobacco abuse: Congratulations on quitting smoking Get the CT scan in September of this year and then after that you'll need annual screening CT scans  Her your history of lung collapse and mild COPD: There is no need for you to take medications at this time Stay off of tobacco  We'll see you back on an as-needed basis

## 2016-09-18 NOTE — Progress Notes (Signed)
Subjective:    Patient ID: Kyle [name], male    DOB: May 29, 1950, 66 y.o.   MRN: [id number]  Synopsis: Referred in 2018 for evaluation of shortness of breath in the setting of right middle lobe collapse. He was a smoker with a history of COPD.  HPI Chief Complaint  Patient presents with  . Follow-up    pt states he is doing well, stopped smoking X4 mos ago and breathing s/s have improved.     Kyle [name] says that he is doing well.  He quit smoking cold [place] four months ago and he feels well.  He has noticed improved breathing, his blood pressure is better. He doesn't feel like he snores much.  He will get a productive cough every now again, no wheezing or dyspnea with it. He has some post nasal drip which he thinks causes it. He has considered sinus surgery.     Past Medical History:  Diagnosis Date  . BPH (benign prostatic hyperplasia)   . Chronic kidney disease   . COPD (chronic obstructive pulmonary disease) ([place])   . Diabetes mellitus without complication ([place])   . Hyperlipidemia   . Hypertension   . Nephrolithiasis   . Seizures ([place])       Review of Systems     Objective:   Physical Exam  Vitals:   09/18/16 1029  BP: 134/68  Pulse: 90  SpO2: 98%  Weight: 207 lb (93.9 kg)  Height: 6' (1.829 m)   Gen: well appearing HENT: OP clear, TM's clear, neck supple PULM: CTA B, normal percussion CV: RRR, no mgr, trace edema GI: BS+, soft, nontender Derm: no cyanosis or rash Psyche: normal mood and affect   Bronchoscopy: March 2018 all airways patent, scant mucus noted  Chest imaging: March 2018 CT chest show right middle lobe collapse April 2018 chest x-ray showed no acute problems, images independently revieweded         Assessment & Plan:  Need for prophylactic vaccination against Streptococcus pneumoniae (pneumococcus)  COPD with chronic bronchitis ([place])  Collapse of right lung  Cigarette smoker   Discussion: Kyle [name] is doing very well  since quitting smoking 4 months ago. I congratulated him on this today. His lung function testing from the [facility] clinic showed mild airflow obstruction. With mild airflow obstruction and a former smoker who has no symptoms there is no indication for medical therapy at this time. His right lung collapse has resolved. He does need to get a flu shot every year, we will give him a Pneumovax vaccine today. He is encouraged to practice good hand hygiene.  Greater than 15 minutes were spent face-to-face with the patient in this 27 visit  Plan: Tobacco abuse: Congratulations on quitting smoking Get the CT scan in September of this year and then after that you'll need annual screening CT scans  Her your history of lung collapse and mild COPD: There is no need for you to take medications at this time Stay off of tobacco  We'll see you back on an as-needed basis    Current Outpatient Prescriptions:  .  Amylase-Lipase-Protease (CREON 10 PO), Take by mouth daily as needed., Disp: , Rfl:  .  atorvastatin (LIPITOR) 10 MG tablet, Take 10 mg by mouth daily., Disp: , Rfl:  .  Cholecalciferol (VITAMIN D3) 5000 units TABS, Take 1 tablet by mouth daily., Disp: , Rfl:  .  escitalopram (LEXAPRO) 10 MG tablet, Take 10 mg by mouth daily., Disp: , Rfl:  .  fluticasone (FLONASE) 50 MCG/ACT nasal spray, Place 2 sprays into both nostrils daily as needed. Reported on 05/05/2015, Disp: , Rfl:  .  glipiZIDE (GLUCOTROL XL) 10 MG 24 hr tablet, Take 10 mg by mouth daily., Disp: , Rfl:  .  hydrALAZINE (APRESOLINE) 50 MG tablet, Take 25 mg by mouth daily. , Disp: , Rfl:  .  insulin lispro (HUMALOG) 100 UNIT/ML injection, Inject 15 Units into the skin daily. , Disp: , Rfl:  .  levETIRAcetam (KEPPRA) 500 MG tablet, Take 500 mg by mouth daily., Disp: , Rfl:  .  liraglutide (VICTOZA) 18 MG/3ML SOPN, Inject 1.8 mg into the skin at bedtime., Disp: , Rfl:  .  losartan-hydrochlorothiazide (HYZAAR) 100-12.5 MG tablet, Take 1 tablet by  mouth daily., Disp: , Rfl:  .  metoprolol succinate (TOPROL-XL) 50 MG 24 hr tablet, Take 1 tablet by mouth daily., Disp: , Rfl:  .  sodium bicarbonate 325 MG tablet, Take 325 mg by mouth 2 (two) times daily., Disp: , Rfl:

## 2016-11-05 ENCOUNTER — Ambulatory Visit
Admission: RE | Admit: 2016-11-05 | Discharge: 2016-11-05 | Disposition: A | Discharge status: Home / Self Care | Payer: Medicare Other | Source: Ambulatory Visit | Attending: Pulmonary Disease | Admitting: Pulmonary Disease

## 2016-11-05 DIAGNOSIS — R590 Localized enlarged lymph nodes: Secondary | ICD-10-CM | POA: Insufficient documentation

## 2016-11-05 DIAGNOSIS — R911 Solitary pulmonary nodule: Secondary | ICD-10-CM | POA: Diagnosis present

## 2016-11-05 DIAGNOSIS — I700 Atherosclerosis of aorta: Secondary | ICD-10-CM | POA: Insufficient documentation

## 2016-11-05 DIAGNOSIS — I2510 Atherosclerotic heart disease of native coronary artery without angina pectoris: Secondary | ICD-10-CM | POA: Diagnosis not present

## 2016-11-05 DIAGNOSIS — E042 Nontoxic multinodular goiter: Secondary | ICD-10-CM | POA: Insufficient documentation

## 2017-06-28 ENCOUNTER — Ambulatory Visit
Admission: RE | Admit: 2017-06-28 | Discharge: 2017-06-28 | Disposition: A | Discharge status: Home / Self Care | Payer: Medicare Other | Source: Ambulatory Visit | Attending: Nephrology | Admitting: Nephrology

## 2017-06-28 DIAGNOSIS — N184 Chronic kidney disease, stage 4 (severe): Secondary | ICD-10-CM | POA: Diagnosis not present

## 2017-06-28 LAB — BASIC METABOLIC PANEL
Anion gap: 10 (ref 5–15)
BUN: 50 mg/dL — ABNORMAL HIGH (ref 6–20)
CO2: 18 mmol/L — ABNORMAL LOW (ref 22–32)
Calcium: 5.7 mg/dL — CL (ref 8.9–10.3)
Chloride: 108 mmol/L (ref 101–111)
Creatinine, Ser: 3.35 mg/dL — ABNORMAL HIGH (ref 0.61–1.24)
GFR calc Af Amer: 20 mL/min — ABNORMAL LOW (ref >60)
GFR calc non Af Amer: 17 mL/min — ABNORMAL LOW (ref >60)
Glucose, Bld: 244 mg/dL — ABNORMAL HIGH (ref 65–99)
Potassium: 3.5 mmol/L (ref 3.5–5.1)
Sodium: 136 mmol/L (ref 135–145)

## 2017-06-28 MED ORDER — SODIUM CHLORIDE 0.9 % IV SOLN
1.0000 g | Freq: Once | INTRAVENOUS | Status: AC
  Administered 2017-06-28: 1 g via INTRAVENOUS
  Filled 2017-06-28: qty 10

## 2017-06-28 NOTE — Progress Notes (Signed)
CRITICAL VALUE STICKER  CRITICAL VALUE: Calcium 5.7  RECEIVER (on-site recipient of call): N [place] NOTIFIED:  06-28-17 [place] (representative from lab):   MD NOTIFIED: Dr. Joetta Manners  TIME OF NOTIFICATION: 06-28-17 1050  RESPONSE: Patient is here in Same Day Surgery for calcium gluconate infusion

## 2018-03-04 DIAGNOSIS — Z992 Dependence on renal dialysis: Secondary | ICD-10-CM | POA: Insufficient documentation

## 2018-03-04 DIAGNOSIS — N186 End stage renal disease: Secondary | ICD-10-CM | POA: Insufficient documentation

## 2018-06-13 ENCOUNTER — Ambulatory Visit: Admit: 2018-06-13 | Payer: Medicare Other | Admitting: Unknown Physician Specialty

## 2018-06-13 SURGERY — COLONOSCOPY WITH PROPOFOL
Anesthesia: General

## 2018-09-16 ENCOUNTER — Encounter: Payer: Self-pay | Admitting: Emergency Medicine

## 2018-09-16 ENCOUNTER — Other Ambulatory Visit: Payer: Self-pay

## 2018-09-16 ENCOUNTER — Inpatient Hospital Stay
Admission: EM | Admit: 2018-09-16 | Discharge: 2018-09-18 | DRG: 640 | Disposition: A | Discharge status: Home / Self Care | Payer: Medicare Other | Attending: Internal Medicine | Admitting: Internal Medicine

## 2018-09-16 ENCOUNTER — Emergency Department: Payer: Medicare Other

## 2018-09-16 DIAGNOSIS — E876 Hypokalemia: Secondary | ICD-10-CM | POA: Diagnosis present

## 2018-09-16 DIAGNOSIS — I951 Orthostatic hypotension: Secondary | ICD-10-CM | POA: Diagnosis present

## 2018-09-16 DIAGNOSIS — Z79890 Hormone replacement therapy (postmenopausal): Secondary | ICD-10-CM | POA: Diagnosis not present

## 2018-09-16 DIAGNOSIS — I120 Hypertensive chronic kidney disease with stage 5 chronic kidney disease or end stage renal disease: Secondary | ICD-10-CM | POA: Diagnosis present

## 2018-09-16 DIAGNOSIS — E1122 Type 2 diabetes mellitus with diabetic chronic kidney disease: Secondary | ICD-10-CM | POA: Diagnosis present

## 2018-09-16 DIAGNOSIS — Z87442 Personal history of urinary calculi: Secondary | ICD-10-CM

## 2018-09-16 DIAGNOSIS — N2581 Secondary hyperparathyroidism of renal origin: Secondary | ICD-10-CM | POA: Diagnosis present

## 2018-09-16 DIAGNOSIS — Z1159 Encounter for screening for other viral diseases: Secondary | ICD-10-CM | POA: Diagnosis not present

## 2018-09-16 DIAGNOSIS — Z87891 Personal history of nicotine dependence: Secondary | ICD-10-CM | POA: Diagnosis not present

## 2018-09-16 DIAGNOSIS — N400 Enlarged prostate without lower urinary tract symptoms: Secondary | ICD-10-CM | POA: Diagnosis present

## 2018-09-16 DIAGNOSIS — N186 End stage renal disease: Secondary | ICD-10-CM | POA: Diagnosis present

## 2018-09-16 DIAGNOSIS — D631 Anemia in chronic kidney disease: Secondary | ICD-10-CM | POA: Diagnosis present

## 2018-09-16 DIAGNOSIS — E785 Hyperlipidemia, unspecified: Secondary | ICD-10-CM | POA: Diagnosis present

## 2018-09-16 DIAGNOSIS — G40909 Epilepsy, unspecified, not intractable, without status epilepticus: Secondary | ICD-10-CM | POA: Diagnosis present

## 2018-09-16 DIAGNOSIS — E860 Dehydration: Secondary | ICD-10-CM | POA: Diagnosis present

## 2018-09-16 DIAGNOSIS — Z992 Dependence on renal dialysis: Secondary | ICD-10-CM | POA: Diagnosis not present

## 2018-09-16 DIAGNOSIS — Z7951 Long term (current) use of inhaled steroids: Secondary | ICD-10-CM

## 2018-09-16 DIAGNOSIS — E1165 Type 2 diabetes mellitus with hyperglycemia: Secondary | ICD-10-CM | POA: Diagnosis present

## 2018-09-16 DIAGNOSIS — Z79899 Other long term (current) drug therapy: Secondary | ICD-10-CM

## 2018-09-16 DIAGNOSIS — J449 Chronic obstructive pulmonary disease, unspecified: Secondary | ICD-10-CM | POA: Diagnosis present

## 2018-09-16 DIAGNOSIS — Z794 Long term (current) use of insulin: Secondary | ICD-10-CM

## 2018-09-16 DIAGNOSIS — I959 Hypotension, unspecified: Secondary | ICD-10-CM | POA: Diagnosis present

## 2018-09-16 LAB — COMPREHENSIVE METABOLIC PANEL
ALT: 11 U/L (ref 0–44)
AST: 23 U/L (ref 15–41)
Albumin: 2 g/dL — ABNORMAL LOW (ref 3.5–5.0)
Alkaline Phosphatase: 82 U/L (ref 38–126)
Anion gap: 15 (ref 5–15)
BUN: 21 mg/dL (ref 8–23)
CO2: 23 mmol/L (ref 22–32)
Calcium: 5.8 mg/dL — CL (ref 8.9–10.3)
Chloride: 96 mmol/L — ABNORMAL LOW (ref 98–111)
Creatinine, Ser: 5.96 mg/dL — ABNORMAL HIGH (ref 0.61–1.24)
GFR calc Af Amer: 10 mL/min — ABNORMAL LOW (ref >60)
GFR calc non Af Amer: 9 mL/min — ABNORMAL LOW (ref >60)
Glucose, Bld: 398 mg/dL — ABNORMAL HIGH (ref 70–99)
Potassium: 2.8 mmol/L — ABNORMAL LOW (ref 3.5–5.1)
Sodium: 134 mmol/L — ABNORMAL LOW (ref 135–145)
Total Bilirubin: 0.6 mg/dL (ref 0.3–1.2)
Total Protein: 5.1 g/dL — ABNORMAL LOW (ref 6.5–8.1)

## 2018-09-16 LAB — CBC WITH DIFFERENTIAL/PLATELET
Abs Immature Granulocytes: 0.08 10*3/uL — ABNORMAL HIGH (ref 0.00–0.07)
Basophils Absolute: 0.1 10*3/uL (ref 0.0–0.1)
Basophils Relative: 1 %
Eosinophils Absolute: 0.1 10*3/uL (ref 0.0–0.5)
Eosinophils Relative: 1 %
HCT: 29.9 % — ABNORMAL LOW (ref 39.0–52.0)
Hemoglobin: 10.6 g/dL — ABNORMAL LOW (ref 13.0–17.0)
Immature Granulocytes: 1 %
Lymphocytes Relative: 12 %
Lymphs Abs: 1.4 10*3/uL (ref 0.7–4.0)
MCH: 32.2 pg (ref 26.0–34.0)
MCHC: 35.5 g/dL (ref 30.0–36.0)
MCV: 90.9 fL (ref 80.0–100.0)
Monocytes Absolute: 0.4 10*3/uL (ref 0.1–1.0)
Monocytes Relative: 4 %
Neutro Abs: 9 10*3/uL — ABNORMAL HIGH (ref 1.7–7.7)
Neutrophils Relative %: 81 %
Platelets: 236 10*3/uL (ref 150–400)
RBC: 3.29 MIL/uL — ABNORMAL LOW (ref 4.22–5.81)
RDW: 13.7 % (ref 11.5–15.5)
WBC: 11 10*3/uL — ABNORMAL HIGH (ref 4.0–10.5)
nRBC: 0 % (ref 0.0–0.2)

## 2018-09-16 LAB — SARS CORONAVIRUS 2 BY RT PCR (HOSPITAL ORDER, PERFORMED IN ~~LOC~~ HOSPITAL LAB): SARS Coronavirus 2: NEGATIVE

## 2018-09-16 LAB — TROPONIN I (HIGH SENSITIVITY): Troponin I (High Sensitivity): 14 ng/L (ref <18)

## 2018-09-16 LAB — GLUCOSE, CAPILLARY: Glucose-Capillary: 276 mg/dL — ABNORMAL HIGH (ref 70–99)

## 2018-09-16 LAB — MAGNESIUM: Magnesium: 1.4 mg/dL — ABNORMAL LOW (ref 1.7–2.4)

## 2018-09-16 MED ORDER — ONDANSETRON HCL 4 MG PO TABS: 4.0000 mg | ORAL_TABLET | Freq: Four times a day (QID) | ORAL | Status: DC | PRN

## 2018-09-16 MED ORDER — POLYETHYLENE GLYCOL 3350 17 G PO PACK: 17.0000 g | PACK | Freq: Every day | ORAL | Status: DC | PRN

## 2018-09-16 MED ORDER — POTASSIUM CHLORIDE CRYS ER 20 MEQ PO TBCR
40.0000 meq | EXTENDED_RELEASE_TABLET | Freq: Once | ORAL | Status: AC
  Administered 2018-09-16: 40 meq via ORAL
  Filled 2018-09-16: qty 2

## 2018-09-16 MED ORDER — VITAMIN D3 25 MCG (1000 UNIT) PO TABS
5000.0000 [IU] | ORAL_TABLET | Freq: Every day | ORAL | Status: DC
  Administered 2018-09-17 – 2018-09-18 (×2): 5000 [IU] via ORAL
  Filled 2018-09-16 (×4): qty 5

## 2018-09-16 MED ORDER — ONDANSETRON HCL 4 MG/2ML IJ SOLN: 4.0000 mg | Freq: Four times a day (QID) | INTRAMUSCULAR | Status: DC | PRN

## 2018-09-16 MED ORDER — SODIUM CHLORIDE 0.45 % IV SOLN
INTRAVENOUS | Status: DC
  Administered 2018-09-17: 01:00:00 via INTRAVENOUS

## 2018-09-16 MED ORDER — HEPARIN SODIUM (PORCINE) 5000 UNIT/ML IJ SOLN
5000.0000 [IU] | Freq: Three times a day (TID) | INTRAMUSCULAR | Status: DC
  Administered 2018-09-17 – 2018-09-18 (×4): 5000 [IU] via SUBCUTANEOUS
  Filled 2018-09-16 (×4): qty 1

## 2018-09-16 MED ORDER — POTASSIUM CHLORIDE CRYS ER 20 MEQ PO TBCR
20.0000 meq | EXTENDED_RELEASE_TABLET | Freq: Two times a day (BID) | ORAL | Status: DC
  Administered 2018-09-17 – 2018-09-18 (×4): 20 meq via ORAL
  Filled 2018-09-16 (×4): qty 1

## 2018-09-16 MED ORDER — ACETAMINOPHEN 325 MG PO TABS: 650.0000 mg | ORAL_TABLET | Freq: Four times a day (QID) | ORAL | Status: DC | PRN

## 2018-09-16 MED ORDER — METOPROLOL SUCCINATE ER 50 MG PO TB24
50.0000 mg | ORAL_TABLET | Freq: Every day | ORAL | Status: DC
  Administered 2018-09-17 – 2018-09-18 (×2): 50 mg via ORAL
  Filled 2018-09-16 (×2): qty 1

## 2018-09-16 MED ORDER — ESCITALOPRAM OXALATE 10 MG PO TABS
10.0000 mg | ORAL_TABLET | Freq: Every day | ORAL | Status: DC
  Administered 2018-09-17 – 2018-09-18 (×2): 10 mg via ORAL
  Filled 2018-09-16 (×2): qty 1

## 2018-09-16 MED ORDER — PANCRELIPASE (LIP-PROT-AMYL) 12000-38000 UNITS PO CPEP
12000.0000 [IU] | ORAL_CAPSULE | Freq: Three times a day (TID) | ORAL | Status: DC
  Administered 2018-09-17 – 2018-09-18 (×5): 12000 [IU] via ORAL
  Filled 2018-09-16 (×7): qty 1

## 2018-09-16 MED ORDER — ATORVASTATIN CALCIUM 10 MG PO TABS
10.0000 mg | ORAL_TABLET | Freq: Every day | ORAL | Status: DC
  Administered 2018-09-17 – 2018-09-18 (×2): 10 mg via ORAL
  Filled 2018-09-16 (×2): qty 1

## 2018-09-16 MED ORDER — MAGNESIUM SULFATE 2 GM/50ML IV SOLN
2.0000 g | Freq: Once | INTRAVENOUS | Status: AC
  Administered 2018-09-16: 2 g via INTRAVENOUS
  Filled 2018-09-16: qty 50

## 2018-09-16 MED ORDER — ACETAMINOPHEN 650 MG RE SUPP: 650.0000 mg | Freq: Four times a day (QID) | RECTAL | Status: DC | PRN

## 2018-09-16 MED ORDER — SODIUM CHLORIDE 0.9 % IV BOLUS
250.0000 mL | Freq: Once | INTRAVENOUS | Status: AC
  Administered 2018-09-16: 250 mL via INTRAVENOUS

## 2018-09-16 MED ORDER — GLIPIZIDE ER 10 MG PO TB24
10.0000 mg | ORAL_TABLET | Freq: Every day | ORAL | Status: DC
  Administered 2018-09-17 – 2018-09-18 (×2): 10 mg via ORAL
  Filled 2018-09-16 (×2): qty 1

## 2018-09-16 MED ORDER — LEVETIRACETAM 500 MG PO TABS
500.0000 mg | ORAL_TABLET | Freq: Two times a day (BID) | ORAL | Status: DC
  Administered 2018-09-17 – 2018-09-18 (×4): 500 mg via ORAL
  Filled 2018-09-16 (×4): qty 1

## 2018-09-16 MED ORDER — INSULIN ASPART 100 UNIT/ML ~~LOC~~ SOLN
0.0000 [IU] | Freq: Three times a day (TID) | SUBCUTANEOUS | Status: DC
  Administered 2018-09-17: 12:00:00 5 [IU] via SUBCUTANEOUS
  Administered 2018-09-17: 09:00:00 3 [IU] via SUBCUTANEOUS
  Administered 2018-09-18: 5 [IU] via SUBCUTANEOUS
  Filled 2018-09-16 (×3): qty 1

## 2018-09-16 MED ORDER — INSULIN ASPART 100 UNIT/ML ~~LOC~~ SOLN
0.0000 [IU] | Freq: Every day | SUBCUTANEOUS | Status: DC
  Administered 2018-09-16: 3 [IU] via SUBCUTANEOUS
  Filled 2018-09-16: qty 1

## 2018-09-16 MED ORDER — SODIUM BICARBONATE 650 MG PO TABS
325.0000 mg | ORAL_TABLET | Freq: Two times a day (BID) | ORAL | Status: DC
  Administered 2018-09-17 – 2018-09-18 (×4): 325 mg via ORAL
  Filled 2018-09-16 (×4): qty 1

## 2018-09-16 MED ORDER — HYDRALAZINE HCL 25 MG PO TABS
25.0000 mg | ORAL_TABLET | Freq: Every day | ORAL | Status: DC
  Administered 2018-09-17 – 2018-09-18 (×2): 25 mg via ORAL
  Filled 2018-09-16 (×2): qty 1

## 2018-09-16 MED ORDER — CALCIUM GLUCONATE 10 % IV SOLN
1.0000 g | Freq: Once | INTRAVENOUS | Status: AC
  Administered 2018-09-16: 1 g via INTRAVENOUS
  Filled 2018-09-16: qty 10

## 2018-09-16 MED ORDER — LEVOTHYROXINE SODIUM 137 MCG PO TABS
137.0000 ug | ORAL_TABLET | Freq: Every day | ORAL | Status: DC
  Administered 2018-09-17: 137 ug via ORAL
  Filled 2018-09-16: qty 1

## 2018-09-16 MED ORDER — CALCITRIOL 0.25 MCG PO CAPS
0.5000 ug | ORAL_CAPSULE | Freq: Two times a day (BID) | ORAL | Status: DC
  Administered 2018-09-17 – 2018-09-18 (×3): 0.5 ug via ORAL
  Filled 2018-09-16 (×4): qty 2

## 2018-09-16 MED ORDER — SODIUM CHLORIDE 0.9% FLUSH
3.0000 mL | Freq: Two times a day (BID) | INTRAVENOUS | Status: DC
  Administered 2018-09-17 – 2018-09-18 (×4): 3 mL via INTRAVENOUS

## 2018-09-16 NOTE — ED Notes (Signed)
ED TO INPATIENT HANDOFF REPORT  ED Nurse Name and Phone #: Joelene Millin [phone]  S Name/Age/Gender Kyle [name] 68 y.o. male Room/Bed: ED18A/ED18A  Code Status   Code Status: Not on file  Home/SNF/Other Home Patient oriented to: self, place, time and situation Is this baseline? Yes   Triage Complete: Triage complete  Chief Complaint low bp sent by dr  Triage Note Pt sent by his doctor for weakness/hypotension. Pt denies pain in triage.   Allergies No Known Allergies  Level of Care/Admitting Diagnosis ED Disposition    ED Disposition Condition Comment   Admit  Hospital Area: [facility] [[id number]]  Level of Care: Med-Surg [16]  Covid Evaluation: Confirmed COVID Negative  Diagnosis: Hypokalemia [172180]  Admitting Physician: Mayer Camel [[id number]]  Attending Physician: Mayer Camel [[id number]]  Estimated length of stay: past midnight tomorrow  Certification:: I certify this patient will need inpatient services for at least 2 midnights  PT Class (Do Not Modify): Inpatient [101]  PT Acc Code (Do Not Modify): Private [1]       B Medical/Surgery History Past Medical History:  Diagnosis Date  . BPH (benign prostatic hyperplasia)   . Chronic kidney disease   . COPD (chronic obstructive pulmonary disease) ([place])   . Diabetes mellitus without complication ([place])   . Hyperlipidemia   . Hypertension   . Nephrolithiasis   . Seizures ([place])    Past Surgical History:  Procedure Laterality Date  . COLONOSCOPY    . COLONOSCOPY WITH PROPOFOL N/A 05/30/2015   Procedure: COLONOSCOPY WITH PROPOFOL;  Surgeon: Manya Silvas, MD;  Location: [facility] ENDOSCOPY;  Service: Endoscopy;  Laterality: N/A;  . kidney stone    . VARICOCELE EXCISION    . VIDEO BRONCHOSCOPY Bilateral 05/25/2016   Procedure: VIDEO BRONCHOSCOPY WITHOUT FLUORO;  Surgeon: Juanito Doom, MD;  Location: [facility] ENDOSCOPY;  Service: Cardiopulmonary;  Laterality: Bilateral;     A IV  Location/Drains/Wounds Patient Lines/Drains/Airways Status   Active Line/Drains/Airways    Name:   Placement date:   Placement time:   Site:   Days:   Peripheral IV 09/16/18 Right Arm   09/16/18    1651    Arm   less than 1          Intake/Output Last 24 hours No intake or output data in the 24 hours ending 09/16/18 2326  Labs/Imaging Results for orders placed or performed during the hospital encounter of 09/16/18 (from the past 48 hour(s))  CBC with Differential     Status: Abnormal   Collection Time: 09/16/18  4:48 PM  Result Value Ref Range   WBC 11.0 (H) 4.0 - 10.5 K/uL   RBC 3.29 (L) 4.22 - 5.81 MIL/uL   Hemoglobin 10.6 (L) 13.0 - 17.0 g/dL   HCT 29.9 (L) 39.0 - 52.0 %   MCV 90.9 80.0 - 100.0 fL   MCH 32.2 26.0 - 34.0 pg   MCHC 35.5 30.0 - 36.0 g/dL   RDW 13.7 11.5 - 15.5 %   Platelets 236 150 - 400 K/uL   nRBC 0.0 0.0 - 0.2 %   Neutrophils Relative % 81 %   Neutro Abs 9.0 (H) 1.7 - 7.7 K/uL   Lymphocytes Relative 12 %   Lymphs Abs 1.4 0.7 - 4.0 K/uL   Monocytes Relative 4 %   Monocytes Absolute 0.4 0.1 - 1.0 K/uL   Eosinophils Relative 1 %   Eosinophils Absolute 0.1 0.0 - 0.5 K/uL   Basophils Relative 1 %  Basophils Absolute 0.1 0.0 - 0.1 K/uL   Immature Granulocytes 1 %   Abs Immature Granulocytes 0.08 (H) 0.00 - 0.07 K/uL    Comment: Performed at [facility], [address] [place], [place] [address]  Comprehensive metabolic panel     Status: Abnormal   Collection Time: 09/16/18  4:48 PM  Result Value Ref Range   Sodium 134 (L) 135 - 145 mmol/L   Potassium 2.8 (L) 3.5 - 5.1 mmol/L   Chloride 96 (L) 98 - 111 mmol/L   CO2 23 22 - 32 mmol/L   Glucose, Bld 398 (H) 70 - 99 mg/dL   BUN 21 8 - 23 mg/dL   Creatinine, Ser 5.96 (H) 0.61 - 1.24 mg/dL   Calcium 5.8 (LL) 8.9 - 10.3 mg/dL    Comment: CRITICAL RESULT CALLED TO, READ BACK BY AND VERIFIED WITH COLIN GILLESPIE RN AT 1728 ON 09/16/2018 SNG    Total Protein 5.1 (L) 6.5 - 8.1 g/dL   Albumin 2.0  (L) 3.5 - 5.0 g/dL   AST 23 15 - 41 U/L   ALT 11 0 - 44 U/L   Alkaline Phosphatase 82 38 - 126 U/L   Total Bilirubin 0.6 0.3 - 1.2 mg/dL   GFR calc non Af Amer 9 (L) >60 mL/min   GFR calc Af Amer 10 (L) >60 mL/min   Anion gap 15 5 - 15    Comment: Performed at [facility], [place], [place] [address]  Troponin I (High Sensitivity)     Status: None   Collection Time: 09/16/18  4:48 PM  Result Value Ref Range   Troponin I (High Sensitivity) 14 <18 ng/L    Comment: (NOTE) Elevated high sensitivity troponin I (hsTnI) values and significant  changes across serial measurements may suggest ACS but many other  chronic and acute conditions are known to elevate hsTnI results.  Refer to the "Links" section for chest pain algorithms and additional  guidance. Performed at [facility], [address] [place], [place] [address]   Magnesium     Status: Abnormal   Collection Time: 09/16/18  4:48 PM  Result Value Ref Range   Magnesium 1.4 (L) 1.7 - 2.4 mg/dL    Comment: Performed at [facility], [address] [place], [place] [address]  SARS Coronavirus 2 (CEPHEID- Performed in [place] hospital lab), Hosp Order     Status: None   Collection Time: 09/16/18  8:16 PM   Specimen: Nasopharyngeal Swab  Result Value Ref Range   SARS Coronavirus 2 NEGATIVE NEGATIVE    Comment: (NOTE) If result is NEGATIVE SARS-CoV-2 target nucleic acids are NOT DETECTED. The SARS-CoV-2 RNA is generally detectable in upper and lower  respiratory specimens during the acute phase of infection. The lowest  concentration of SARS-CoV-2 viral copies this assay can detect is 250  copies / mL. A negative result does not preclude SARS-CoV-2 infection  and should not be used as the sole basis for treatment or other  patient management decisions.  A negative result may occur with  improper specimen collection / handling, submission of specimen other  than nasopharyngeal  swab, presence of viral mutation(s) within the  areas targeted by this assay, and inadequate number of viral copies  (<250 copies / mL). A negative result must be combined with clinical  observations, patient history, and epidemiological information. If result is POSITIVE SARS-CoV-2 target nucleic acids are DETECTED. The SARS-CoV-2 RNA is generally detectable in upper  and lower  respiratory specimens dur ing the acute phase of infection.  Positive  results are indicative of active infection with SARS-CoV-2.  Clinical  correlation with patient history and other diagnostic information is  necessary to determine patient infection status.  Positive results do  not rule out bacterial infection or co-infection with other viruses. If result is PRESUMPTIVE POSTIVE SARS-CoV-2 nucleic acids MAY BE PRESENT.   A presumptive positive result was obtained on the submitted specimen  and confirmed on repeat testing.  While 2019 novel coronavirus  (SARS-CoV-2) nucleic acids may be present in the submitted sample  additional confirmatory testing may be necessary for epidemiological  and / or clinical management purposes  to differentiate between  SARS-CoV-2 and other Sarbecovirus currently known to infect humans.  If clinically indicated additional testing with an alternate test  methodology [phone]) is advised. The SARS-CoV-2 RNA is generally  detectable in upper and lower respiratory sp ecimens during the acute  phase of infection. The expected result is Negative. Fact Sheet for Patients:  StrictlyIdeas.no Fact Sheet for Healthcare Providers: BankingDealers.co.za This test is not yet approved or cleared by the [place] FDA and has been authorized for detection and/or diagnosis of SARS-CoV-2 by FDA under an Emergency Use Authorization (EUA).  This EUA will remain in effect (meaning this test can be used) for the duration of the COVID-19 declaration  under Section 564(b)(1) of the Act, 21 U.S.C. section 360bbb-3(b)(1), unless the authorization is terminated or revoked sooner. Performed at [facility], [address] [place], [place] [address]   Glucose, capillary     Status: Abnormal   Collection Time: 09/16/18 11:00 PM  Result Value Ref Range   Glucose-Capillary 276 (H) 70 - 99 mg/dL   Dg Chest 2 View  Result Date: 09/16/2018 CLINICAL DATA:  Weakness and hypotension EXAM: CHEST - 2 VIEW COMPARISON:  07/27/16 FINDINGS: Cardiac shadow is within normal limits. Lungs are well aerated bilaterally. No focal infiltrate or sizable effusion is seen. Old rib fractures are noted bilaterally. Degenerative changes of the thoracic spine are noted. IMPRESSION: Chronic changes without acute abnormality. Electronically Signed   By: [name] M.D.   On: 09/16/2018 17:36    Pending Labs Unresulted Labs (From admission, onward)    Start     Ordered   09/16/18 2143  Hemoglobin A1c  Once,   STAT    Comments: To assess prior glycemic control    09/16/18 2143   Signed and Held  HIV antibody (Routine Testing)  Once,   R     Signed and Held   Signed and Held  CBC  (heparin)  Once,   R    Comments: Baseline for heparin therapy IF NOT ALREADY DRAWN.  Notify MD if PLT < 100 K.    Signed and Held   Signed and Held  Creatinine, serum  (heparin)  Once,   R    Comments: Baseline for heparin therapy IF NOT ALREADY DRAWN.    Signed and Held   Signed and Held  TSH  Once,   R     Signed and Held   Signed and Occupational hygienist morning,   R     Signed and Held   Signed and Held  CBC  Tomorrow morning,   R     Signed and Held   Signed and Held  Protime-INR  Tomorrow morning,   R     Signed and Held  Vitals/Pain Today's Vitals   09/16/18 2215 09/16/18 2230 09/16/18 2245 09/16/18 2300  BP:      Pulse: 70 69 67 65  Resp:      Temp:      TempSrc:      SpO2: 100% 100% 99% 99%  Weight:      Height:       PainSc:        Isolation Precautions No active isolations  Medications Medications  insulin aspart (novoLOG) injection 0-15 Units (has no administration in time range)  insulin aspart (novoLOG) injection 0-5 Units (3 Units Subcutaneous Given 09/16/18 2306)  sodium chloride 0.9 % bolus 250 mL (0 mLs Intravenous Stopped 09/16/18 2152)  calcium gluconate inj 10% (1 g) URGENT USE ONLY! (1 g Intravenous Given 09/16/18 1939)  potassium chloride SA (K-DUR) CR tablet 40 mEq (40 mEq Oral Given 09/16/18 1945)  magnesium sulfate IVPB 2 g 50 mL (0 g Intravenous Stopped 09/16/18 2152)    Mobility walks Moderate fall risk   Focused Assessments Cardiac Assessment Handoff:    No results found for: CKTOTAL, CKMB, CKMBINDEX, TROPONINI No results found for: DDIMER Does the Patient currently have chest pain? No      R Recommendations: See Admitting Provider Note  Report given to:   Additional Notes:

## 2018-09-16 NOTE — ED Notes (Signed)
Floor

## 2018-09-16 NOTE — Progress Notes (Signed)
Advanced care plan. Purpose of the Encounter: CODE STATUS Parties in Attendance: Patient Patient's Decision Capacity: Good Subjective/Patient's story: 68 year old male patient with ESRD on peritoneal dialysis was referred by nephrology office for abnormal labs. Objective/Medical story Patient was evaluated in the emergency room has low potassium, calcium and magnesium Needs IV electrolyte replacement Needs nephrology evaluation Goals of care determination:  Advance care directives goals of care treatment plan discussed Patient is full resuscitation Tobacco cessation counseled to the patient for 6-minute Nicotine patch offered CODE STATUS: Full code Time spent discussing advanced care planning: 16 minutes

## 2018-09-16 NOTE — ED Provider Notes (Signed)
[facility] Emergency Department Provider Note    First MD Initiated Contact with Patient 09/16/18 Kyle [name]     (approximate)  I have reviewed the triage vital signs and the nursing notes.   HISTORY  Chief Complaint Hypotension    HPI Kyle [name] is a 68 y.o. male on nightly peritoneal dialysis presents the ER from nephrology clinic due to low blood pressure tachycardia and generalized weakness and confusion.  Certainly concern the patient is been over dialyzing as he does appear dehydrated.  Blood work in clinic today showed hypokalemia and hypo Cal C. Kyle [name].  Denies any fevers.  No abdominal pain.  No nausea or vomiting.  Denies any chest discomfort.    Past Medical History:  Diagnosis Date   BPH (benign prostatic hyperplasia)    Chronic kidney disease    COPD (chronic obstructive pulmonary disease) (H[facility])    Diabetes mellitus without complication ([facility])    Hyperlipidemia    Hypertension    Nephrolithiasis    Seizures ([place])    Family History  Problem Relation Age of Onset   Diabetes Mother    Diabetes Maternal Grandmother    Diabetes Maternal Grandfather    Lung cancer Father    Emphysema Paternal Grandfather    Past Surgical History:  Procedure Laterality Date   COLONOSCOPY     COLONOSCOPY WITH PROPOFOL N/A 05/30/2015   Procedure: COLONOSCOPY WITH PROPOFOL;  Surgeon: Manya Silvas, MD;  Location: [place];  Service: Endoscopy;  Laterality: N/A;   kidney stone     VARICOCELE EXCISION     VIDEO BRONCHOSCOPY Bilateral 05/25/2016   Procedure: VIDEO BRONCHOSCOPY WITHOUT FLUORO;  Surgeon: Juanito Doom, MD;  Location: [place];  Service: Cardiopulmonary;  Laterality: Bilateral;   Patient Active Problem List   Diagnosis Date Noted   Lung nodule 06/12/2016   Narrowing of airway    Cigarette smoker 05/24/2016   Collapse of right lung 05/24/2016   COPD with chronic bronchitis ([place]) 05/24/2016   Acne  05/20/2015   Calculus of kidney 05/20/2015   Chest pain, non-cardiac 05/20/2015   Seizure ([place]) 05/20/2015   Current tobacco use 05/20/2015   Chronic kidney disease (CKD), stage III (moderate) ([place]) 03/29/2015   Type 2 diabetes mellitus ([place]) 03/15/2015   Lipoma of shoulder 03/08/2014   Other synovitis and tenosynovitis, right shoulder 03/08/2014   Bursitis of elbow 02/15/2014   Absolute anemia 08/08/2013   Benign fibroma of prostate 08/08/2013   Back pain, chronic 08/08/2013   Chronic obstructive pulmonary disease ([place]) 08/08/2013   Diabetes mellitus ([place]) 08/08/2013   BP (high blood pressure) 08/08/2013   HLD (hyperlipidemia) 08/08/2013   Acne erythematosa 08/08/2013      Prior to Admission medications   Medication Sig Start Date End Date Taking? Authorizing Provider  Amylase-Lipase-Protease (CREON 10 PO) Take by mouth daily as needed.    [provider]  atorvastatin (LIPITOR) 10 MG tablet Take 10 mg by mouth daily. 04/08/15   [provider]  Cholecalciferol (VITAMIN D3) 5000 units TABS Take 1 tablet by mouth daily.    [provider]  escitalopram (LEXAPRO) 10 MG tablet Take 10 mg by mouth daily.    [provider]  fluticasone (FLONASE) 50 MCG/ACT nasal spray Place 2 sprays into both nostrils daily as needed. Reported on 05/05/2015 01/14/14   [provider]  glipiZIDE (GLUCOTROL XL) 10 MG 24 hr tablet Take 10 mg by mouth daily. 03/29/15   [provider]  hydrALAZINE (  APRESOLINE) 50 MG tablet Take 25 mg by mouth daily.  05/12/15   [provider]  insulin lispro (HUMALOG) 100 UNIT/ML injection Inject 15 Units into the skin daily.  08/03/15   [provider]  levETIRAcetam (KEPPRA) 500 MG tablet Take 500 mg by mouth daily. 06/17/14   [provider]  liraglutide (VICTOZA) 18 MG/3ML SOPN Inject 1.8 mg into the skin at bedtime.    [provider]  losartan-hydrochlorothiazide  (HYZAAR) 100-12.5 MG tablet Take 1 tablet by mouth daily. 07/01/14 09/18/16  [provider]  metoprolol succinate (TOPROL-XL) 50 MG 24 hr tablet Take 1 tablet by mouth daily. 03/15/15 09/18/16  [provider]  sodium bicarbonate 325 MG tablet Take 325 mg by mouth 2 (two) times daily.    [provider]    Allergies Patient has no known allergies.    Social History Social History   Tobacco Use   Smoking status: Former Smoker    Packs/day: 1.00    Years: 40.00    Pack years: 40.00    Types: Cigarettes    Quit date: 05/25/2016    Years since quitting: 2.3   Smokeless tobacco: Never Used  Substance Use Topics   Alcohol use: Yes    Alcohol/week: 8.0 standard drinks    Types: 8 Cans of beer per week    Comment: beer or wine    Drug use: No    Review of Systems Patient denies headaches, rhinorrhea, blurry vision, numbness, shortness of breath, chest pain, edema, cough, abdominal pain, nausea, vomiting, diarrhea, dysuria, fevers, rashes or hallucinations unless otherwise stated above in HPI. ____________________________________________   PHYSICAL EXAM:  VITAL SIGNS: Vitals:   09/16/18 1643 09/16/18 1838  BP: 91/75 (!) 169/88  Pulse: 76   Resp: 18   Temp: 98.2 F (36.8 C)   SpO2: 98%     Constitutional: Alert and oriented. Frail and weak appearing Eyes: Conjunctivae are normal.  Head: Atraumatic. Nose: No congestion/rhinnorhea. Mouth/Throat: Mucous membranes are moist.   Neck: No stridor. Painless ROM.  Cardiovascular: Normal rate, regular rhythm. Grossly normal heart sounds.  Good peripheral circulation. Respiratory: Normal respiratory effort.  No retractions. Lungs CTAB. Gastrointestinal: Soft and nontender. No distention. No abdominal bruits. No CVA tenderness. Genitourinary:  Musculoskeletal: No lower extremity tenderness nor edema.  No joint effusions. Neurologic:  Normal speech and language. No gross focal neurologic deficits are  appreciated. No facial droop Skin:  Skin is warm, dry and intact. No rash noted. Psychiatric: Mood and affect are normal. Speech and behavior are normal.  ____________________________________________   LABS (all labs ordered are listed, but only abnormal results are displayed)  Results for orders placed or performed during the hospital encounter of 09/16/18 (from the past 24 hour(s))  CBC with Differential     Status: Abnormal   Collection Time: 09/16/18  4:48 PM  Result Value Ref Range   WBC 11.0 (H) 4.0 - 10.5 K/uL   RBC 3.29 (L) 4.22 - 5.81 MIL/uL   Hemoglobin 10.6 (L) 13.0 - 17.0 g/dL   HCT 29.9 (L) 39.0 - 52.0 %   MCV 90.9 80.0 - 100.0 fL   MCH 32.2 26.0 - 34.0 pg   MCHC 35.5 30.0 - 36.0 g/dL   RDW 13.7 11.5 - 15.5 %   Platelets 236 150 - 400 K/uL   nRBC 0.0 0.0 - 0.2 %   Neutrophils Relative % 81 %   Neutro Abs 9.0 (H) 1.7 - 7.7 K/uL   Lymphocytes Relative 12 %  Lymphs Abs 1.4 0.7 - 4.0 K/uL   Monocytes Relative 4 %   Monocytes Absolute 0.4 0.1 - 1.0 K/uL   Eosinophils Relative 1 %   Eosinophils Absolute 0.1 0.0 - 0.5 K/uL   Basophils Relative 1 %   Basophils Absolute 0.1 0.0 - 0.1 K/uL   Immature Granulocytes 1 %   Abs Immature Granulocytes 0.08 (H) 0.00 - 0.07 K/uL  Comprehensive metabolic panel     Status: Abnormal   Collection Time: 09/16/18  4:48 PM  Result Value Ref Range   Sodium 134 (L) 135 - 145 mmol/L   Potassium 2.8 (L) 3.5 - 5.1 mmol/L   Chloride 96 (L) 98 - 111 mmol/L   CO2 23 22 - 32 mmol/L   Glucose, Bld 398 (H) 70 - 99 mg/dL   BUN 21 8 - 23 mg/dL   Creatinine, Ser 5.96 (H) 0.61 - 1.24 mg/dL   Calcium 5.8 (LL) 8.9 - 10.3 mg/dL   Total Protein 5.1 (L) 6.5 - 8.1 g/dL   Albumin 2.0 (L) 3.5 - 5.0 g/dL   AST 23 15 - 41 U/L   ALT 11 0 - 44 U/L   Alkaline Phosphatase 82 38 - 126 U/L   Total Bilirubin 0.6 0.3 - 1.2 mg/dL   GFR calc non Af Amer 9 (L) >60 mL/min   GFR calc Af Amer 10 (L) >60 mL/min   Anion gap 15 5 - 15  Troponin I (High Sensitivity)      Status: None   Collection Time: 09/16/18  4:48 PM  Result Value Ref Range   Troponin I (High Sensitivity) 14 <18 ng/L  Magnesium     Status: Abnormal   Collection Time: 09/16/18  4:48 PM  Result Value Ref Range   Magnesium 1.4 (L) 1.7 - 2.4 mg/dL   ____________________________________________  EKG My review and personal interpretation at Time:   16:50 Indication: hypotension  Rate: 75  Rhythm: sinus Axis: normal Other: normal intervals, no stemi ____________________________________________  RADIOLOGY  .prim  ____________________________________________   PROCEDURES  Procedure(s) performed:  .Critical Care Performed by: Merlyn Lot, MD Authorized by: Merlyn Lot, MD   Critical care provider statement:    Critical care time (minutes):  10   Critical care time was exclusive of:  Separately billable procedures and treating other patients   Critical care was necessary to treat or prevent imminent or life-threatening deterioration of the following conditions:  Dehydration and metabolic crisis   Critical care was time spent personally by me on the following activities:  Development of treatment plan with patient or surrogate, discussions with consultants, evaluation of patient's response to treatment, examination of patient, obtaining history from patient or surrogate, ordering and performing treatments and interventions, ordering and review of laboratory studies, ordering and review of radiographic studies, pulse oximetry, re-evaluation of patient's condition and review of old charts      Critical Care performed: yes ____________________________________________   INITIAL IMPRESSION / [place] / ED COURSE  Pertinent labs & imaging results that were available during my care of the patient were reviewed by me and considered in my medical decision making (see chart for details).   DDX: Slight abnormality, dehydration, sepsis, renal failure  Kyle [name] is a 68 y.o. who presents to the ED with symptoms as described above.  Patient with evidence of acute hypokalemia and hypocalcemia with dehydration and hypo-bulimia in the setting of peritoneal dialysis.  No evidence of sepsis.  His abdominal exam and physical exam  is reassuring but the patient does appear frail and weak.  Discussed case with Dr. Holley Raring who agrees with plan for admission the hospital for withholding dialysis tonight, gentle IV resuscitation and electrolyte replenishment.     The patient was evaluated in Emergency Department today for the symptoms described in the history of present illness. He/she was evaluated in the context of the global COVID-19 pandemic, which necessitated consideration that the patient might be at risk for infection with the SARS-CoV-2 virus that causes COVID-19. Institutional protocols and algorithms that pertain to the evaluation of patients at risk for COVID-19 are in a state of rapid change based on information released by regulatory bodies including the CDC and federal and state organizations. These policies and algorithms were followed during the patient's care in the ED.  As part of my medical decision making, I reviewed the following data within the [facility] notes reviewed and incorporated, Labs reviewed, notes from prior ED visits and [place] Controlled Substance Database   ____________________________________________   FINAL CLINICAL IMPRESSION(S) / ED DIAGNOSES  Final diagnoses:  Hypocalcemia  Hypokalemia  Dehydration      NEW MEDICATIONS STARTED DURING THIS VISIT:  New Prescriptions   No medications on file     Note:  This document was prepared using Dragon voice recognition software and may include unintentional dictation errors.    Merlyn Lot, MD 09/16/18 2049

## 2018-09-16 NOTE — ED Notes (Signed)
Date and time results received: 09/16/18 1729 (use smartphrase ".now" to insert current time)  Test: Ca Critical Value: 5.8  Name of Provider Notified: Quentin Cornwall  Orders Received? Or Actions Taken?: Next bed; charge nurse aware.

## 2018-09-16 NOTE — ED Notes (Signed)
Receiving RN states unable to accept PT due to BP 180/95.

## 2018-09-16 NOTE — H&P (Signed)
[place] [facility] [place] NAME: Kyle [name]    MR#:  [id number]  DATE OF BIRTH:  25-Nov-1950  DATE OF ADMISSION:  09/16/2018  PRIMARY CARE PHYSICIAN: Idelle Crouch, MD   REQUESTING/REFERRING PHYSICIAN: Merlyn Lot, MD  CHIEF COMPLAINT:   Chief Complaint  Patient presents with  . Hypotension    HISTORY OF PRESENT ILLNESS:  Kyle [name]  is a 68 y.o. male with a known history of end-stage renal disease on peritoneal dialysis, diabetes mellitus, hypertension, BPH, COPD.  He is currently on nightly peritoneal dialysis.  He was sent to the emergency room from the nephrology clinic after seeing Dr. Holley Raring due to hypotension and tachycardia with generalized weakness and mild confusion.  Currently he is awake, alert, oriented x4.  He denies chest pain, shortness of breath, fever, chills, nausea, vomiting, diarrhea.  He denies abdominal pain.  Potassium on arrival is 2.8 and calcium is 5.8 with magnesium 1.4.  He was mildly hypotensive on arrival with blood pressure 91/75.  However current blood pressure is 160s over 90s.  He received calcium gluconate, p.o. potassium, and IV magnesium replacement as well as a 250 cc normal saline bolus in the emergency room.  The ED physician has spoken to Dr. Holley Raring planning to see the patient in the morning and recommending replacing electrolytes and holding peritoneal dialysis tonight.  He has been admitted to the hospitalist service for further management.  PAST MEDICAL HISTORY:   Past Medical History:  Diagnosis Date  . BPH (benign prostatic hyperplasia)   . Chronic kidney disease   . COPD (chronic obstructive pulmonary disease) ([place])   . Diabetes mellitus without complication ([place])   . Hyperlipidemia   . Hypertension   . Nephrolithiasis   . Seizures ([place])     PAST SURGICAL HISTORY:   Past Surgical History:  Procedure Laterality Date  . COLONOSCOPY    . COLONOSCOPY WITH PROPOFOL N/A 05/30/2015    Procedure: COLONOSCOPY WITH PROPOFOL;  Surgeon: Manya Silvas, MD;  Location[facility] ENDOSCOPY;  Service: Endoscopy;  Laterality: N/A;  . kidney stone    . VARICOCELE EXCISION    . VIDEO BRONCHOSCOPY Bilateral 05/25/2016   Procedure: VIDEO BRONCHOSCOPY WITHOUT FLUORO;  Surgeon: Juanito Doom, MD;  Location: [facility] ENDOSCOPY;  Service: Cardiopulmonary;  Laterality: Bilateral;    SOCIAL HISTORY:   Social History   Tobacco Use  . Smoking status: Former Smoker    Packs/day: 1.00    Years: 40.00    Pack years: 40.00    Types: Cigarettes    Quit date: 05/25/2016    Years since quitting: 2.3  . Smokeless tobacco: Never Used  Substance Use Topics  . Alcohol use: Yes    Alcohol/week: 8.0 standard drinks    Types: 8 Cans of beer per week    Comment: beer or wine     FAMILY HISTORY:   Family History  Problem Relation Age of Onset  . Diabetes Mother   . Diabetes Maternal Grandmother   . Diabetes Maternal Grandfather   . Lung cancer Father   . Emphysema Paternal Grandfather     DRUG ALLERGIES:  No Known Allergies  REVIEW OF SYSTEMS:   Review of Systems  Constitutional: Negative for chills, fever and malaise/fatigue.  HENT: Negative for congestion, sinus pain and sore throat.   Eyes: Negative for blurred vision and double vision.  Respiratory: Negative for cough, shortness of breath and wheezing.   Cardiovascular: Negative for chest pain,  palpitations, orthopnea and leg swelling.  Gastrointestinal: Negative for abdominal pain, constipation, diarrhea, heartburn, nausea and vomiting.  Genitourinary: Negative for flank pain.  Musculoskeletal: Negative for falls, joint pain and myalgias.  Skin: Negative for itching and rash.  Neurological: Negative for dizziness, weakness and headaches.  Psychiatric/Behavioral: Negative.  Negative for depression.    MEDICATIONS AT HOME:   Prior to Admission medications   Medication Sig Start Date End Date Taking? Authorizing Provider   atorvastatin (LIPITOR) 10 MG tablet Take 10 mg by mouth daily. 04/08/15  Yes [provider]  calcitRIOL (ROCALTROL) 0.5 MCG capsule Take 0.5 mcg by mouth 2 (two) times a day.   Yes [provider]  Cholecalciferol (VITAMIN D3) 5000 units TABS Take 1 tablet by mouth daily.   Yes [provider]  escitalopram (LEXAPRO) 10 MG tablet Take 10 mg by mouth daily.   Yes [provider]  furosemide (LASIX) 80 MG tablet Take 80 mg by mouth daily. 06/28/18  Yes [provider]  glipiZIDE (GLUCOTROL XL) 10 MG 24 hr tablet Take 10 mg by mouth daily. 03/29/15  Yes [provider]  hydrALAZINE (APRESOLINE) 50 MG tablet Take 25 mg by mouth daily.  05/12/15  Yes [provider]  insulin lispro (HUMALOG) 100 UNIT/ML KwikPen Inject 4-14 Units into the skin daily. Sliding scale 08/27/16  Yes [provider]  levETIRAcetam (KEPPRA) 500 MG tablet Take 500 mg by mouth 2 (two) times a day. 09/05/15  Yes [provider]  levothyroxine (SYNTHROID) 137 MCG tablet Take 137 mcg by mouth daily. 09/08/18  Yes [provider]  liraglutide (VICTOZA) 18 MG/3ML SOPN Inject 1.8 mg into the skin at bedtime.   Yes [provider]  losartan (COZAAR) 100 MG tablet Take 100 mg by mouth daily. 09/12/18  Yes [provider]  metoprolol succinate (TOPROL-XL) 50 MG 24 hr tablet Take 1 tablet by mouth daily. 03/15/15 09/16/18 Yes [provider]  Pancrelipase, Lip-Prot-Amyl, 24000-76000 units CPEP Take 1 capsule by mouth 3 (three) times daily.   Yes [provider]  potassium chloride SA (K-DUR) 20 MEQ tablet Take 20 mEq by mouth 2 (two) times a day. 07/22/18  Yes [provider]  Semaglutide,0.25 or 0.5MG /DOS, 2 MG/1.5ML SOPN Inject 0.5 mg into the skin once a week. 06/26/17  Yes [provider]  sodium bicarbonate 325 MG tablet Take 325 mg by mouth 2 (two) times daily.   Yes [provider]   Amylase-Lipase-Protease (CREON 10 PO) Take by mouth daily as needed.    [provider]  fluticasone (FLONASE) 50 MCG/ACT nasal spray Place 2 sprays into both nostrils daily as needed. Reported on 05/05/2015 01/14/14   [provider]  losartan-hydrochlorothiazide (HYZAAR) 100-12.5 MG tablet Take 1 tablet by mouth daily. 07/01/14 09/18/16  [provider]      VITAL SIGNS:  Blood pressure (!) 176/92, pulse 65, temperature 98.2 F (36.8 C), temperature source Oral, resp. rate 18, height 6' (1.829 m), weight 94.8 kg, SpO2 99 %.  PHYSICAL EXAMINATION:  Physical Exam  GENERAL:  68 y.o.-year-old patient lying in the bed with no acute distress.  EYES: Pupils equal, round, reactive to light and accommodation. No scleral icterus. Extraocular muscles intact.  HEENT: Head atraumatic, normocephalic. Oropharynx and nasopharynx clear.  NECK:  Supple, no jugular venous distention. No thyroid enlargement, no tenderness.  LUNGS: Normal breath sounds bilaterally, no wheezing, rales,rhonchi or crepitation. No use of accessory muscles of respiration.  CARDIOVASCULAR: Regular rate and rhythm,  S1, S2 normal. No murmurs, rubs, or gallops.  ABDOMEN: Soft, nondistended, nontender.  Left lower abdominal peritoneal dialysis catheter insertion site clean, dry, intact with no drainage, edema, erythema at site bowel sounds present. No organomegaly or mass.  EXTREMITIES: No pedal edema, cyanosis, or clubbing.  NEUROLOGIC: Cranial nerves II through XII are intact. Muscle strength 5/5 in all extremities. Sensation intact. Gait not checked.  PSYCHIATRIC: The patient is alert and oriented x 3.  Normal affect and good eye contact. SKIN: No obvious rash, lesion, or ulcer.   LABORATORY PANEL:   CBC Recent Labs  Lab 09/16/18 1648  WBC 11.0*  HGB 10.6*  HCT 29.9*  PLT 236   ------------------------------------------------------------------------------------------------------------------   Chemistries  Recent Labs  Lab 09/16/18 1648  NA 134*  K 2.8*  CL 96*  CO2 23  GLUCOSE 398*  BUN 21  CREATININE 5.96*  CALCIUM 5.8*  MG 1.4*  AST 23  ALT 11  ALKPHOS 82  BILITOT 0.6   ------------------------------------------------------------------------------------------------------------------  Cardiac Enzymes No results for input(s): TROPONINI in the last 168 hours. ------------------------------------------------------------------------------------------------------------------  RADIOLOGY:  Dg Chest 2 View  Result Date: 09/16/2018 CLINICAL DATA:  Weakness and hypotension EXAM: CHEST - 2 VIEW COMPARISON:  07/27/16 FINDINGS: Cardiac shadow is within normal limits. Lungs are well aerated bilaterally. No focal infiltrate or sizable effusion is seen. Old rib fractures are noted bilaterally. Degenerative changes of the thoracic spine are noted. IMPRESSION: Chronic changes without acute abnormality. Electronically Signed   By: [name] M.D.   On: 09/16/2018 17:36      IMPRESSION AND PLAN:   1.  Hypokalemia - Patient received p.o. potassium replacement - Will repeat BMP in the a.m. and continue to monitor potassium level closely -Telemetry monitoring  2.  Hypocalcemia - IV calcium replacement - Telemetry monitoring -Repeat calcium level in the a.m.  3.  Hypomagnesemia - IV magnesium replacement -Telemetry monitoring  4.  Dehydration-felt likely to be related to peritoneal dialysis-holding dialysis tonight - Patient is receiving gentle rehydration with IV fluids 75 cc/h - Will monitor closely for volume overload  5.  Diabetes mellitus -Moderate sliding scale insulin  6.  End-stage renal disease on peritoneal dialysis - Peritoneal dialysis is being held tonight -Dr. Holley Raring consulted for recommendations  DVT and PPI prophylaxis    All the records are reviewed and case discussed with ED provider. The plan of care was discussed in details with the patient  (and family). I answered all questions. The patient agreed to proceed with the above mentioned plan. Further management will depend upon hospital course.   CODE STATUS: Full code  TOTAL TIME TAKING CARE OF THIS PATIENT:43minutes.    [facility] on 09/16/2018 at 11:17 PM  Pager - [phone]  After 6pm go to [email or web address] - Proofreader  [facility]  Office  [phone]  CC: Primary care physician; Idelle Crouch, MD   Note: This dictation was prepared with Dragon dictation along with smaller phrase technology. Any transcriptional errors that result from this process are unintentional.

## 2018-09-16 NOTE — ED Notes (Addendum)
Pt denies CP/SHOB at this time. Pt st feeling "weak".

## 2018-09-16 NOTE — ED Notes (Signed)
ED Provider Robinson at bedside. 

## 2018-09-16 NOTE — ED Triage Notes (Signed)
Pt sent by his doctor for weakness/hypotension. Pt denies pain in triage.

## 2018-09-17 ENCOUNTER — Other Ambulatory Visit: Payer: Self-pay

## 2018-09-17 LAB — GLUCOSE, CAPILLARY
Glucose-Capillary: 163 mg/dL — ABNORMAL HIGH (ref 70–99)
Glucose-Capillary: 209 mg/dL — ABNORMAL HIGH (ref 70–99)
Glucose-Capillary: 120 mg/dL — ABNORMAL HIGH (ref 70–99)

## 2018-09-17 LAB — BASIC METABOLIC PANEL
Anion gap: 11 (ref 5–15)
BUN: 24 mg/dL — ABNORMAL HIGH (ref 8–23)
CO2: 25 mmol/L (ref 22–32)
Calcium: 5.6 mg/dL — CL (ref 8.9–10.3)
Chloride: 99 mmol/L (ref 98–111)
Creatinine, Ser: 6.09 mg/dL — ABNORMAL HIGH (ref 0.61–1.24)
GFR calc Af Amer: 10 mL/min — ABNORMAL LOW (ref >60)
GFR calc non Af Amer: 9 mL/min — ABNORMAL LOW (ref >60)
Glucose, Bld: 225 mg/dL — ABNORMAL HIGH (ref 70–99)
Potassium: 2.8 mmol/L — ABNORMAL LOW (ref 3.5–5.1)
Sodium: 135 mmol/L (ref 135–145)

## 2018-09-17 LAB — CBC
HCT: 29.1 % — ABNORMAL LOW (ref 39.0–52.0)
Hemoglobin: 10.1 g/dL — ABNORMAL LOW (ref 13.0–17.0)
MCH: 32.5 pg (ref 26.0–34.0)
MCHC: 34.7 g/dL (ref 30.0–36.0)
MCV: 93.6 fL (ref 80.0–100.0)
Platelets: 222 10*3/uL (ref 150–400)
RBC: 3.11 MIL/uL — ABNORMAL LOW (ref 4.22–5.81)
RDW: 13.8 % (ref 11.5–15.5)
WBC: 8.6 10*3/uL (ref 4.0–10.5)
nRBC: 0 % (ref 0.0–0.2)

## 2018-09-17 LAB — TSH: TSH: 101 u[IU]/mL — ABNORMAL HIGH (ref 0.350–4.500)

## 2018-09-17 LAB — POTASSIUM: Potassium: 3.2 mmol/L — ABNORMAL LOW (ref 3.5–5.1)

## 2018-09-17 LAB — MAGNESIUM: Magnesium: 1.8 mg/dL (ref 1.7–2.4)

## 2018-09-17 LAB — HEMOGLOBIN A1C
Hgb A1c MFr Bld: 10 % — ABNORMAL HIGH (ref 4.8–5.6)
Mean Plasma Glucose: 240.3 mg/dL

## 2018-09-17 LAB — PROTIME-INR
INR: 0.9 (ref 0.8–1.2)
Prothrombin Time: 12.2 seconds (ref 11.4–15.2)

## 2018-09-17 LAB — ALBUMIN: Albumin: 1.8 g/dL — ABNORMAL LOW (ref 3.5–5.0)

## 2018-09-17 LAB — PHOSPHORUS: Phosphorus: 4.8 mg/dL — ABNORMAL HIGH (ref 2.5–4.6)

## 2018-09-17 MED ORDER — HYDRALAZINE HCL 20 MG/ML IJ SOLN
INTRAMUSCULAR | Status: AC
  Administered 2018-09-17: 10 mg via INTRAVENOUS
  Filled 2018-09-17: qty 1

## 2018-09-17 MED ORDER — INSULIN GLARGINE 100 UNIT/ML ~~LOC~~ SOLN
10.0000 [IU] | Freq: Every day | SUBCUTANEOUS | Status: DC
  Administered 2018-09-17 – 2018-09-18 (×2): 10 [IU] via SUBCUTANEOUS
  Filled 2018-09-17 (×3): qty 0.1

## 2018-09-17 MED ORDER — POTASSIUM CHLORIDE CRYS ER 20 MEQ PO TBCR
20.0000 meq | EXTENDED_RELEASE_TABLET | ORAL | Status: AC
  Administered 2018-09-17 (×2): 20 meq via ORAL
  Filled 2018-09-17 (×2): qty 1

## 2018-09-17 MED ORDER — CALCIUM CARBONATE 1250 (500 CA) MG PO TABS
1.0000 | ORAL_TABLET | Freq: Three times a day (TID) | ORAL | Status: DC
  Filled 2018-09-17 (×2): qty 1

## 2018-09-17 MED ORDER — LEVOTHYROXINE SODIUM 50 MCG PO TABS
150.0000 ug | ORAL_TABLET | Freq: Every day | ORAL | Status: DC
  Administered 2018-09-18: 150 ug via ORAL
  Filled 2018-09-17: qty 1

## 2018-09-17 MED ORDER — CALCIUM CARBONATE ANTACID 500 MG PO CHEW
500.0000 mg | CHEWABLE_TABLET | Freq: Three times a day (TID) | ORAL | Status: DC
  Administered 2018-09-17 (×2): 500 mg via ORAL
  Filled 2018-09-17 (×2): qty 3

## 2018-09-17 MED ORDER — HYDRALAZINE HCL 20 MG/ML IJ SOLN
10.0000 mg | Freq: Three times a day (TID) | INTRAMUSCULAR | Status: DC | PRN
  Administered 2018-09-17 (×2): 10 mg via INTRAVENOUS
  Filled 2018-09-17: qty 1

## 2018-09-17 MED ORDER — MAGNESIUM SULFATE 2 GM/50ML IV SOLN
2.0000 g | Freq: Once | INTRAVENOUS | Status: AC
  Administered 2018-09-17: 2 g via INTRAVENOUS
  Filled 2018-09-17: qty 50

## 2018-09-17 MED ORDER — CALCIUM ACETATE (PHOS BINDER) 667 MG PO CAPS
667.0000 mg | ORAL_CAPSULE | Freq: Three times a day (TID) | ORAL | Status: DC
  Administered 2018-09-17: 667 mg via ORAL
  Filled 2018-09-17: qty 1

## 2018-09-17 MED ORDER — POTASSIUM CHLORIDE 10 MEQ/100ML IV SOLN
10.0000 meq | INTRAVENOUS | Status: DC
  Filled 2018-09-17: qty 100

## 2018-09-17 NOTE — Consult Note (Signed)
PHARMACY CONSULT NOTE - FOLLOW UP  Pharmacy Consult for Electrolyte Monitoring and Replacement   Recent Labs: Potassium (mmol/L)  Date Value  09/17/2018 3.2 (L)  08/08/2013 3.9   Magnesium (mg/dL)  Date Value  09/17/2018 1.8   Calcium (mg/dL)  Date Value  09/17/2018 5.6 (LL)   Calcium, Total (mg/dL)  Date Value  08/08/2013 9.1   Albumin (g/dL)  Date Value  09/17/2018 1.8 (L)  08/08/2013 3.4   Phosphorus (mg/dL)  Date Value  09/17/2018 4.8 (H)   Sodium (mmol/L)  Date Value  09/17/2018 135  08/08/2013 138     Assessment: 7/22 0456  K = 2.8, Mg = 1.8, Corrected Ca = 7.36, Phos = 4.8  Pt is on Calcitriol 0.55mcg bid and  Calcium Carbonate 500mg  tid (for TDD 600mg  elemental Calcium)  Pt with home dose standing order KCl 48meq bid  7/22 @1954  K: 3.2. Of note, patient received KCl 20 mEq @ 1710 and 1932. Potassium level may not  be reflected given most recent dose was given. Patient is scheduled KCl 20 mEq BID- next dose due at 2200.   Goal of Therapy:  Electrolytes WNL's  Plan:  Will continue schedule dose of potassium that is due at 2200 and will follow potassium with AM labs.   Will recheck Ca, Mg, Phos, and K with am labs and continue to monitor/replenish electrolytes.  Rowland Lathe, PharmD Clinical Pharmacist 09/17/2018 8:22 PM

## 2018-09-17 NOTE — Progress Notes (Signed)
MD paged for calcium of 5.6. see new orders

## 2018-09-17 NOTE — Consult Note (Signed)
PHARMACY CONSULT NOTE - FOLLOW UP  Pharmacy Consult for Electrolyte Monitoring and Replacement   Recent Labs: Potassium (mmol/L)  Date Value  09/17/2018 2.8 (L)  08/08/2013 3.9   Magnesium (mg/dL)  Date Value  09/16/2018 1.4 (L)   Calcium (mg/dL)  Date Value  09/17/2018 5.6 (LL)   Calcium, Total (mg/dL)  Date Value  08/08/2013 9.1   Albumin (g/dL)  Date Value  09/17/2018 1.8 (L)  08/08/2013 3.4   Sodium (mmol/L)  Date Value  09/17/2018 135  08/08/2013 138     Assessment: 7/22 0456  K = 2.8, Mg = 1.8, Corrected Ca = 7.36, Phos = 4.8  Pt is on Calcitriol 0.68mcg bid and  Calcium Carbonate 500mg  tid (for TDD 600mg  elemental Calcium)  Pt with home dose standing order KCl 52meq bid  Goal of Therapy:  Electrolytes WNL's  Plan:  Will give Mg Sulfate IV 2g x 1 and KCl 53meq IV x 4 and recheck 1 hour after end of last dose.  Will change Calcium carbonate order to Calcium Acetate 667mg  tid to assist in lowering Phosphorous  Will recheck Ca, Mg, Phos, and K with am labs and continue to monitor/replenish electrolytes.  Lu Duffel, PharmD, BCPS Clinical Pharmacist 09/17/2018 1:29 PM

## 2018-09-17 NOTE — Progress Notes (Signed)
[name] at [place] NAME: Kyle [name]    MR#:  [id number]  DATE OF BIRTH:  August 24, 1950  SUBJECTIVE:   Patient came in from nephrology office after he was found to be weak hypotensive. Workup in the ER shows electrolyte abnormality. Patient feels little better. He did eat some. Drinking fluids. He did get IV hydration. PD was held last night. REVIEW OF SYSTEMS:   Review of Systems  Constitutional: Negative for chills, fever and weight loss.  HENT: Negative for ear discharge, ear pain and nosebleeds.   Eyes: Negative for blurred vision, pain and discharge.  Respiratory: Negative for sputum production, shortness of breath, wheezing and stridor.   Cardiovascular: Negative for chest pain, palpitations, orthopnea and PND.  Gastrointestinal: Negative for abdominal pain, diarrhea, nausea and vomiting.  Genitourinary: Negative for frequency and urgency.  Musculoskeletal: Negative for back pain and joint pain.  Neurological: Positive for weakness. Negative for sensory change, speech change and focal weakness.  Psychiatric/Behavioral: Negative for depression and hallucinations. The patient is not nervous/anxious.    Tolerating Diet:yes Tolerating PT:   DRUG ALLERGIES:  No Known Allergies  VITALS:  Blood pressure (!) 149/78, pulse 65, temperature 98 F (36.7 C), temperature source Oral, resp. rate 16, height 6' (1.829 m), weight 94.8 kg, SpO2 100 %.  PHYSICAL EXAMINATION:   Physical Exam  GENERAL:  68 y.o.-year-old patient lying in the bed with no acute distress.  EYES: Pupils equal, round, reactive to light and accommodation. No scleral icterus. Extraocular muscles intact.  HEENT: Head atraumatic, normocephalic. Oropharynx and nasopharynx clear.  NECK:  Supple, no jugular venous distention. No thyroid enlargement, no tenderness.  LUNGS: Normal breath sounds bilaterally, no wheezing, rales, rhonchi. No use of accessory muscles of  respiration.  CARDIOVASCULAR: S1, S2 normal. No murmurs, rubs, or gallops.  ABDOMEN: Soft, nontender, nondistended. Bowel sounds present. No organomegaly or mass. PD cath+ EXTREMITIES: No cyanosis, clubbing or edema b/l.    NEUROLOGIC: Cranial nerves II through XII are intact. No focal Motor or sensory deficits b/l.   PSYCHIATRIC:  patient is alert and oriented x 3.  SKIN: No obvious rash, lesion, or ulcer.   LABORATORY PANEL:  CBC Recent Labs  Lab 09/17/18 0456  WBC 8.6  HGB 10.1*  HCT 29.1*  PLT 222    Chemistries  Recent Labs  Lab 09/16/18 1648 09/17/18 0456  NA 134* 135  K 2.8* 2.8*  CL 96* 99  CO2 23 25  GLUCOSE 398* 225*  BUN 21 24*  CREATININE 5.96* 6.09*  CALCIUM 5.8* 5.6*  MG 1.4* 1.8  AST 23  --   ALT 11  --   ALKPHOS 82  --   BILITOT 0.6  --    Cardiac Enzymes No results for input(s): TROPONINI in the last 168 hours. RADIOLOGY:  Dg Chest 2 View  Result Date: 09/16/2018 CLINICAL DATA:  Weakness and hypotension EXAM: CHEST - 2 VIEW COMPARISON:  07/27/16 FINDINGS: Cardiac shadow is within normal limits. Lungs are well aerated bilaterally. No focal infiltrate or sizable effusion is seen. Old rib fractures are noted bilaterally. Degenerative changes of the thoracic spine are noted. IMPRESSION: Chronic changes without acute abnormality. Electronically Signed   By: Kyle [name] M.D.   On: 09/16/2018 17:36   ASSESSMENT AND PLAN:  Kyle [name]  is a 68 y.o. male with a known history of end-stage renal disease on peritoneal dialysis, diabetes mellitus, hypertension, BPH, COPD.  He is currently on nightly peritoneal  dialysis.  He was sent to the emergency room from the nephrology clinic after seeing Dr. Holley Raring due to hypotension and tachycardia with generalized weakness and mild confusion.   1.  Hypokalemia - Patient received p.o. potassium replacement - Will repeat BMP and have pharmacy do electrolyte replacement consult placed  2.  Hypocalcemia - IV calcium  replacement -Repeat calcium level in the a.m.  3.  Hypomagnesemia - IV magnesium replacement  4.  Dehydration-felt likely to be related to peritoneal dialysis-holding dialysis tonight - Patient is receiving gentle rehydration with IV fluids 75 cc/h-- eating and drinking well. -Blood pressure improved. DC IV fluids - Will monitor closely for volume overload  5.  Diabetes mellitus -Moderate sliding scale insulin  6.  End-stage renal disease on peritoneal dialysis - Peritoneal dialysis is being held tonight -Dr. Holley Raring consulted for recommendations  7. DVT prophylaxis subcu heparin  Case discussed with Care Management/Social Worker. Management plans discussed with the patient  CODE STATUS: full   TOTAL TIME TAKING CARE OF THIS PATIENT: *30* minutes.  >50% time spent on counselling and coordination of care  POSSIBLE D/C IN *1-2 DAYS, DEPENDING ON CLINICAL CONDITION.  Note: This dictation was prepared with Dragon dictation along with smaller phrase technology. Any transcriptional errors that result from this process are unintentional.  Fritzi Mandes M.D on 09/17/2018 at 3:03 PM  Between 7am to 6pm - Pager - [phone]  After 6pm go to [email or web address] - password [email or web address]  Office  [phone]  CC: Primary care physician; Idelle Crouch, MDPatient ID: Kyle [name], male   DOB: Mar 31, 1950, 68 y.o.   MRN: [id number]

## 2018-09-17 NOTE — Progress Notes (Addendum)
Inpatient Diabetes Program Recommendations  AACE/ADA: New Consensus Statement on Inpatient Glycemic Control (2015)  Target Ranges:  Prepandial:   less than 140 mg/dL      Peak postprandial:   less than 180 mg/dL (1-2 hours)      Critically ill patients:  140 - 180 mg/dL   Lab Results  Component Value Date   GLUCAP 163 (H) 09/17/2018   HGBA1C 10.0 (H) 09/16/2018    Review of Glycemic Control Results for Kyle [name], Kyle [name] "[place]" (MRN [id number]) as of 09/17/2018 10:06  Ref. Range 09/16/2018 23:00 09/17/2018 07:48  Glucose-Capillary Latest Ref Range: 70 - 99 mg/dL 276 (H) 163 (H)   Diabetes history: DM 2 Outpatient Diabetes medications:  Glucotrol 10 mg daily, Humalog 4-14 units tid with meals, Ozempic  Lantus 20 units daily Current orders for Inpatient glycemic control:  Novolog moderate tid with meals and HS Glucotrol XL 10 mg daily  Inpatient Diabetes Program Recommendations:    Note A1C is >goal indicating average blood sugars of 240 mg/dL.  Needs adjustment in DM medications. Last visit with Endocrinlogist was 12/19.  Thanks,  Adah Perl, RN, BC-ADM Inpatient Diabetes Coordinator Pager [phone] (8a-5p)  Addendum: 12:30 Spoke with patient regarding current A1C.  He states "I've been a bad patient".  He has not been taking the Lantus or Ozempic consistently as was listed in visit with Endocrinologist on in 01/2018.  We discussed A1C goals.  Also encouraged patient to f/u with Dr. Ladell Pier.  He states that it is a struggle with the PD and diabetes. Patient seems motivated to improve control. He does state that he needs refills on diabetes medication prescriptions.    Please add Lantus 10 units daily while in the hospital.   Thanks,  Adah Perl, RN, BC-ADM Inpatient Diabetes Coordinator Pager [phone] (8a-5p)

## 2018-09-18 LAB — BASIC METABOLIC PANEL
Anion gap: 12 (ref 5–15)
BUN: 27 mg/dL — ABNORMAL HIGH (ref 8–23)
CO2: 23 mmol/L (ref 22–32)
Calcium: 6.3 mg/dL — CL (ref 8.9–10.3)
Chloride: 103 mmol/L (ref 98–111)
Creatinine, Ser: 6.13 mg/dL — ABNORMAL HIGH (ref 0.61–1.24)
GFR calc Af Amer: 10 mL/min — ABNORMAL LOW (ref >60)
GFR calc non Af Amer: 8 mL/min — ABNORMAL LOW (ref >60)
Glucose, Bld: 107 mg/dL — ABNORMAL HIGH (ref 70–99)
Potassium: 4 mmol/L (ref 3.5–5.1)
Sodium: 138 mmol/L (ref 135–145)

## 2018-09-18 LAB — GLUCOSE, CAPILLARY
Glucose-Capillary: 167 mg/dL — ABNORMAL HIGH (ref 70–99)
Glucose-Capillary: 96 mg/dL (ref 70–99)
Glucose-Capillary: 223 mg/dL — ABNORMAL HIGH (ref 70–99)

## 2018-09-18 LAB — HIV ANTIBODY (ROUTINE TESTING W REFLEX): HIV Screen 4th Generation wRfx: NONREACTIVE

## 2018-09-18 LAB — PHOSPHORUS: Phosphorus: 5 mg/dL — ABNORMAL HIGH (ref 2.5–4.6)

## 2018-09-18 LAB — MAGNESIUM: Magnesium: 2 mg/dL (ref 1.7–2.4)

## 2018-09-18 MED ORDER — LOSARTAN POTASSIUM 50 MG PO TABS
100.0000 mg | ORAL_TABLET | Freq: Every day | ORAL | Status: DC
  Administered 2018-09-18: 100 mg via ORAL
  Filled 2018-09-18: qty 2

## 2018-09-18 MED ORDER — LEVOTHYROXINE SODIUM 150 MCG PO TABS: 150.0000 ug | ORAL_TABLET | Freq: Every day | ORAL | 0 refills | Status: DC

## 2018-09-18 MED ORDER — CALCIUM GLUCONATE-NACL 1-0.675 GM/50ML-% IV SOLN
1.0000 g | Freq: Once | INTRAVENOUS | Status: AC
  Administered 2018-09-18: 1000 mg via INTRAVENOUS
  Filled 2018-09-18: qty 50

## 2018-09-18 MED ORDER — POTASSIUM CHLORIDE CRYS ER 20 MEQ PO TBCR: 20.0000 meq | EXTENDED_RELEASE_TABLET | Freq: Two times a day (BID) | ORAL | 0 refills | Status: DC

## 2018-09-18 MED ORDER — CALCIUM ACETATE (PHOS BINDER) 667 MG PO CAPS
1334.0000 mg | ORAL_CAPSULE | Freq: Three times a day (TID) | ORAL | Status: DC
  Administered 2018-09-18 (×2): 1334 mg via ORAL
  Filled 2018-09-18 (×2): qty 2

## 2018-09-18 MED ORDER — CALCIUM CARBONATE ANTACID 500 MG PO CHEW: 1.0000 | CHEWABLE_TABLET | Freq: Three times a day (TID) | ORAL | 3 refills | Status: DC

## 2018-09-18 NOTE — Progress Notes (Signed)
[facility]  ROUNDING NOTE   Subjective:  Patient well-known to [place]. We follow him for peritoneal dialysis. He was sent for admission given orthostatic hypotension, hypocalcemia, and hypokalemia. P.o. intake at home has been poor per his report.   Objective:  Vital signs in last 24 hours:  Temp:  [97.6 F (36.4 C)-98.8 F (37.1 C)] 98.8 F (37.1 C) (07/23 1137) Pulse Rate:  [62-68] 66 (07/23 1137) Resp:  [16-20] 19 (07/23 1137) BP: (135-184)/(68-85) 135/68 (07/23 1137) SpO2:  [100 %] 100 % (07/23 1137)  Weight change:  Filed Weights   09/16/18 1643  Weight: 94.8 kg    Intake/Output: I/O last 3 completed shifts: In: 790 [P.O.:740; IV Piggyback:50] Out: 1025 [Urine:1025]   Intake/Output this shift:  Total I/O In: 480 [P.O.:480] Out: -   Physical Exam: General: No acute distress  Head: Normocephalic, atraumatic. Moist oral mucosal membranes  Eyes: Anicteric  Neck: Supple, trachea midline  Lungs:  Clear to auscultation, normal effort  Heart: S1S2 no rubs  Abdomen:  Soft, nontender, bowel sounds present  Extremities: No peripheral edema.  Neurologic: Awake, alert, following commands  Skin: No lesions  Access: Peritoneal dialysis catheter in place    Basic Metabolic Panel: Recent Labs  Lab 09/16/18 1648 09/17/18 0456 09/17/18 1954 09/18/18 0440  NA 134* 135  --  138  K 2.8* 2.8* 3.2* 4.0  CL 96* 99  --  103  CO2 23 25  --  23  GLUCOSE 398* 225*  --  107*  BUN 21 24*  --  27*  CREATININE 5.96* 6.09*  --  6.13*  CALCIUM 5.8* 5.6*  --  6.3*  MG 1.4* 1.8  --  2.0  PHOS  --  4.8*  --  5.0*    Liver Function Tests: Recent Labs  Lab 09/16/18 1648 09/17/18 0456  AST 23  --   ALT 11  --   ALKPHOS 82  --   BILITOT 0.6  --   PROT 5.1*  --   ALBUMIN 2.0* 1.8*   No results for input(s): LIPASE, AMYLASE in the last 168 hours. No results for input(s): AMMONIA in the last 168 hours.  CBC: Recent Labs  Lab 09/16/18 1648 09/17/18 0456   WBC 11.0* 8.6  NEUTROABS 9.0*  --   HGB 10.6* 10.1*  HCT 29.9* 29.1*  MCV 90.9 93.6  PLT 236 222    Cardiac Enzymes: No results for input(s): CKTOTAL, CKMB, CKMBINDEX, TROPONINI in the last 168 hours.  BNP: Invalid input(s): POCBNP  CBG: Recent Labs  Lab 09/17/18 1130 09/17/18 1700 09/17/18 2124 09/18/18 0737 09/18/18 1136  GLUCAP 209* 120* 167* 96 223*    Microbiology: Results for orders placed or performed during the hospital encounter of 09/16/18  SARS Coronavirus 2 (CEPHEID- Performed in [place] hospital lab), Hosp Order     Status: None   Collection Time: 09/16/18  8:16 PM   Specimen: Nasopharyngeal Swab  Result Value Ref Range Status   SARS Coronavirus 2 NEGATIVE NEGATIVE Final    Comment: (NOTE) If result is NEGATIVE SARS-CoV-2 target nucleic acids are NOT DETECTED. The SARS-CoV-2 RNA is generally detectable in upper and lower  respiratory specimens during the acute phase of infection. The lowest  concentration of SARS-CoV-2 viral copies this assay can detect is 250  copies / mL. A negative result does not preclude SARS-CoV-2 infection  and should not be used as the sole basis for treatment or other  patient management decisions.  A negative  result may occur with  improper specimen collection / handling, submission of specimen other  than nasopharyngeal swab, presence of viral mutation(s) within the  areas targeted by this assay, and inadequate number of viral copies  (<250 copies / mL). A negative result must be combined with clinical  observations, patient history, and epidemiological information. If result is POSITIVE SARS-CoV-2 target nucleic acids are DETECTED. The SARS-CoV-2 RNA is generally detectable in upper and lower  respiratory specimens dur ing the acute phase of infection.  Positive  results are indicative of active infection with SARS-CoV-2.  Clinical  correlation with patient history and other diagnostic information is  necessary to  determine patient infection status.  Positive results do  not rule out bacterial infection or co-infection with other viruses. If result is PRESUMPTIVE POSTIVE SARS-CoV-2 nucleic acids MAY BE PRESENT.   A presumptive positive result was obtained on the submitted specimen  and confirmed on repeat testing.  While 2019 novel coronavirus  (SARS-CoV-2) nucleic acids may be present in the submitted sample  additional confirmatory testing may be necessary for epidemiological  and / or clinical management purposes  to differentiate between  SARS-CoV-2 and other Sarbecovirus currently known to infect humans.  If clinically indicated additional testing with an alternate test  methodology [phone]) is advised. The SARS-CoV-2 RNA is generally  detectable in upper and lower respiratory sp ecimens during the acute  phase of infection. The expected result is Negative. Fact Sheet for Patients:  StrictlyIdeas.no Fact Sheet for Healthcare Providers: BankingDealers.co.za This test is not yet approved or cleared by the [place] FDA and has been authorized for detection and/or diagnosis of SARS-CoV-2 by FDA under an Emergency Use Authorization (EUA).  This EUA will remain in effect (meaning this test can be used) for the duration of the COVID-19 declaration under Section 564(b)(1) of the Act, 21 U.S.C. section 360bbb-3(b)(1), unless the authorization is terminated or revoked sooner. Performed at [facility], [address] [place], [place] [address]     Coagulation Studies: Recent Labs    09/17/18 0456  LABPROT 12.2  INR 0.9    Urinalysis: No results for input(s): COLORURINE, LABSPEC, PHURINE, GLUCOSEU, HGBUR, BILIRUBINUR, KETONESUR, PROTEINUR, UROBILINOGEN, NITRITE, LEUKOCYTESUR in the last 72 hours.  Invalid input(s): APPERANCEUR    Imaging: Dg Chest 2 View  Result Date: 09/16/2018 CLINICAL DATA:  Weakness and hypotension  EXAM: CHEST - 2 VIEW COMPARISON:  07/27/16 FINDINGS: Cardiac shadow is within normal limits. Lungs are well aerated bilaterally. No focal infiltrate or sizable effusion is seen. Old rib fractures are noted bilaterally. Degenerative changes of the thoracic spine are noted. IMPRESSION: Chronic changes without acute abnormality. Electronically Signed   By: [name] M.D.   On: 09/16/2018 17:36     Medications:    . atorvastatin  10 mg Oral Daily  . calcitRIOL  0.5 mcg Oral BID  . calcium acetate  1,334 mg Oral TID WC  . cholecalciferol  5,000 Units Oral Daily  . escitalopram  10 mg Oral Daily  . glipiZIDE  10 mg Oral Daily  . heparin  5,000 Units Subcutaneous Q8H  . hydrALAZINE  25 mg Oral Daily  . insulin aspart  0-15 Units Subcutaneous TID WC  . insulin aspart  0-5 Units Subcutaneous QHS  . insulin glargine  10 Units Subcutaneous Daily  . levETIRAcetam  500 mg Oral BID  . levothyroxine  150 mcg Oral QAC breakfast  . lipase/protease/amylase  12,000 Units Oral TID WC  . losartan  100 mg Oral Daily  . metoprolol succinate  50 mg Oral Daily  . potassium chloride SA  20 mEq Oral BID  . sodium bicarbonate  325 mg Oral BID  . sodium chloride flush  3 mL Intravenous Q12H   acetaminophen **OR** acetaminophen, hydrALAZINE, ondansetron **OR** ondansetron (ZOFRAN) IV, polyethylene glycol  Assessment/ Plan:  68 y.o. male with past medical history of ESRD on peritoneal dialysis, BPH, COPD, diabetes mellitus type 2, hyperlipidemia, hypertension, nephrolithiasis, seizure disorder, anemia of chronic kidney disease, and secondary hyperparathyroidism who was admitted with orthostatic hypotension, hypocalcemia, and hypokalemia.  [facility]  1.  ESRD on peritoneal dialysis.  We have held peritoneal dialysis treatments over the past 2 evenings given orthostatic hypotension as well as hypokalemia.  He will resume peritoneal dialysis tonight as per our instructions.  I have advised him to use only 1.5%  dextrose solution at home for now.  He verbalized understanding of this.  2.  Anemia of chronic kidney disease.  Hemoglobin 10.1.  We will continue to monitor hemoglobin as an outpatient and administer Epogen as appropriate.  3.  Hypocalcemia.  Calcium up to 6.3.  I have advised him to take Tums 1 tablet p.o. 3 times daily at home.  Patient verbalized understanding.  4.  Hypokalemia.  We have advised the patient to take potassium chloride 20 mEq p.o. twice daily.    5.  Disposition as per hospitalist.   LOS: 2 Inocente Krach 7/23/20203:20 PM

## 2018-09-18 NOTE — Consult Note (Signed)
PHARMACY CONSULT NOTE - FOLLOW UP  Pharmacy Consult for Electrolyte Monitoring and Replacement   Recent Labs: Potassium (mmol/L)  Date Value  09/18/2018 4.0  08/08/2013 3.9   Magnesium (mg/dL)  Date Value  09/18/2018 2.0   Calcium (mg/dL)  Date Value  09/18/2018 6.3 (LL)   Calcium, Total (mg/dL)  Date Value  08/08/2013 9.1   Albumin (g/dL)  Date Value  09/17/2018 1.8 (L)  08/08/2013 3.4   Phosphorus (mg/dL)  Date Value  09/18/2018 5.0 (H)   Sodium (mmol/L)  Date Value  09/18/2018 138  08/08/2013 138     Assessment: 07/22 0456  K = 2.8, Mg = 1.8, Corrected Ca = 7.36, Phos = 4.8 07/23 0440  K = 4.0, Mg = 2.0, Corrected Calcium 8.06, Phos = 5.0   Pt is on Calcitriol 0.37mcg bid and  Calcium Acetate 667mg  tid   Pt with home dose standing order KCl 26meq bid  Goal of Therapy:  Electrolytes WNL's  Plan:  Will increase dose of calcium acetate to 1334mg  tid to address calcium and phosphorous levels  No additional magnesium or potassium replenishment warranted at this time - will continue home potassium supplement  Will recheck Ca, Phos, and K with am labs and continue to monitor/replenish electrolytes.  Lu Duffel, PharmD, BCPS Clinical Pharmacist 09/18/2018 7:15 AM

## 2018-09-18 NOTE — Discharge Summary (Signed)
[employer] [facility] [place] NAME: Kyle [name]    MR#:  [id number]  DATE OF BIRTH:  September 28, 1950  DATE OF ADMISSION:  09/16/2018 ADMITTING PHYSICIAN: Christel Mormon, MD  DATE OF DISCHARGE: 09/18/2018  PRIMARY CARE PHYSICIAN: Idelle Crouch, MD    ADMISSION DIAGNOSIS:  Hypocalcemia [E83.51] Dehydration [E86.0] Hypokalemia [E87.6]  DISCHARGE DIAGNOSIS:  Electrolyte Abnormality --now improved ESRD on PD Dehydration with hypotension--resolved SECONDARY DIAGNOSIS:   Past Medical History:  Diagnosis Date  . BPH (benign prostatic hyperplasia)   . Chronic kidney disease   . COPD (chronic obstructive pulmonary disease) ([place])   . Diabetes mellitus without complication ([place])   . Hyperlipidemia   . Hypertension   . Nephrolithiasis   . Seizures [facility])     HOSPITAL COURSE:  [facility] a68 y.o.malewith a known history of end-stage renal disease on peritoneal dialysis, diabetes mellitus, hypertension, BPH, COPD. He is currently on nightly peritoneal dialysis. He was sent to the emergency room from the nephrology clinic after seeing Dr. Holley Raring due to hypotension and tachycardia with generalized weakness and mild confusion.   1.Hypokalemia -Patient received p.o. potassium replacement -k 4.0  2. Hypocalcemia -IV calcium replacement x2 with po TUMS -Repeat calcium levels 6.3  3. Hypomagnesemia -IV magnesium replacement -mag 2.0  4. Dehydration-felt likely to be related to peritoneal dialysis-holding dialysis tonight -Patient is received IV fluids- eating and drinking well. -Blood pressure improved.   5. Diabetes mellitus-2 -Moderate sliding scale insulin -continue home dose of insulin and oral meds  6. End-stage renal disease on peritoneal dialysis -Peritoneal dialysis is being managed by Dr. Zollie Scale. It was held for two nights in a row  7. DVT prophylaxis subcu heparin  overall improved. Discharge to  home. CONSULTS OBTAINED:  Treatment Team:  Anthonette Legato, MD  DRUG ALLERGIES:  No Known Allergies  DISCHARGE MEDICATIONS:   Allergies as of 09/18/2018   No Known Allergies     Medication List    STOP taking these medications   furosemide 80 MG tablet Commonly known as: LASIX   losartan-hydrochlorothiazide 100-12.5 MG tablet Commonly known as: HYZAAR   sodium bicarbonate 325 MG tablet   Victoza 18 MG/3ML Sopn Generic drug: liraglutide   Vitamin D3 125 MCG (5000 UT) Tabs     TAKE these medications   atorvastatin 10 MG tablet Commonly known as: LIPITOR Take 10 mg by mouth daily.   calcitRIOL 0.5 MCG capsule Commonly known as: ROCALTROL Take 0.5 mcg by mouth 2 (two) times a day.   calcium carbonate 500 MG chewable tablet Commonly known as: Tums Chew 1 tablet (200 mg of elemental calcium total) by mouth 3 (three) times daily with meals.   CREON 10 PO Take by mouth daily as needed.   escitalopram 10 MG tablet Commonly known as: LEXAPRO Take 10 mg by mouth daily.   fluticasone 50 MCG/ACT nasal spray Commonly known as: FLONASE Place 2 sprays into both nostrils daily as needed. Reported on 05/05/2015   glipiZIDE 10 MG 24 hr tablet Commonly known as: GLUCOTROL XL Take 10 mg by mouth daily.   hydrALAZINE 50 MG tablet Commonly known as: APRESOLINE Take 25 mg by mouth daily.   insulin glargine 100 unit/mL Sopn Commonly known as: LANTUS Inject 20 Units into the skin daily.   insulin lispro 100 UNIT/ML KwikPen Commonly known as: HUMALOG Inject 4-14 Units into the skin daily. Sliding scale   levETIRAcetam 500 MG tablet Commonly known as: KEPPRA Take 500 mg by  mouth 2 (two) times a day.   levothyroxine 150 MCG tablet Commonly known as: SYNTHROID Take 1 tablet (150 mcg total) by mouth daily. What changed:   medication strength  how much to take   losartan 100 MG tablet Commonly known as: COZAAR Take 100 mg by mouth daily.   metoprolol succinate 50  MG 24 hr tablet Commonly known as: TOPROL-XL Take 1 tablet by mouth daily.   Pancrelipase (Lip-Prot-Amyl) 24000-76000 units Cpep Take 1 capsule by mouth 3 (three) times daily.   potassium chloride SA 20 MEQ tablet Commonly known as: K-DUR Take 1 tablet (20 mEq total) by mouth 2 (two) times a day.   Semaglutide(0.25 or 0.5MG /DOS) 2 MG/1.5ML Sopn Inject 0.5 mg into the skin once a week.       If you experience worsening of your admission symptoms, develop shortness of breath, life threatening emergency, suicidal or homicidal thoughts you must seek medical attention immediately by calling 911 or calling your MD immediately  if symptoms less severe.  You Must read complete instructions/literature along with all the possible adverse reactions/side effects for all the Medicines you take and that have been prescribed to you. Take any new Medicines after you have completely understood and accept all the possible adverse reactions/side effects.   Please note  You were cared for by a hospitalist during your hospital stay. If you have any questions about your discharge medications or the care you received while you were in the hospital after you are discharged, you can call the unit and asked to speak with the hospitalist on call if the hospitalist that took care of you is not available. Once you are discharged, your primary care physician will handle any further medical issues. Please note that NO REFILLS for any discharge medications will be authorized once you are discharged, as it is imperative that you return to your primary care physician (or establish a relationship with a primary care physician if you do not have one) for your aftercare needs so that they can reassess your need for medications and monitor your lab values. Today   SUBJECTIVE   Feels a lot better  VITAL SIGNS:  Blood pressure (!) 160/70, pulse 68, temperature 97.6 F (36.4 C), temperature source Oral, resp. rate 20, height  6' (1.829 m), weight 94.8 kg, SpO2 100 %.  I/O:    Intake/Output Summary (Last 24 hours) at 09/18/2018 1027 Last data filed at 09/18/2018 1006 Gross per 24 hour  Intake 790 ml  Output 725 ml  Net 65 ml    PHYSICAL EXAMINATION:  GENERAL:  68 y.o.-year-old patient lying in the bed with no acute distress.  EYES: Pupils equal, round, reactive to light and accommodation. No scleral icterus. Extraocular muscles intact.  HEENT: Head atraumatic, normocephalic. Oropharynx and nasopharynx clear.  NECK:  Supple, no jugular venous distention. No thyroid enlargement, no tenderness.  LUNGS: Normal breath sounds bilaterally, no wheezing, rales,rhonchi or crepitation. No use of accessory muscles of respiration.  CARDIOVASCULAR: S1, S2 normal. No murmurs, rubs, or gallops.  ABDOMEN: Soft, non-tender, non-distended. Bowel sounds present. No organomegaly or mass.  EXTREMITIES: No pedal edema, cyanosis, or clubbing.  NEUROLOGIC: Cranial nerves II through XII are intact. Muscle strength 5/5 in all extremities. Sensation intact. Gait not checked.  PSYCHIATRIC: The patient is alert and oriented x 3.  SKIN: No obvious rash, lesion, or ulcer.   DATA REVIEW:   CBC  Recent Labs  Lab 09/17/18 0456  WBC 8.6  HGB 10.1*  HCT  29.1*  PLT 222    Chemistries  Recent Labs  Lab 09/16/18 1648  09/18/18 0440  NA 134*   < > 138  K 2.8*   < > 4.0  CL 96*   < > 103  CO2 23   < > 23  GLUCOSE 398*   < > 107*  BUN 21   < > 27*  CREATININE 5.96*   < > 6.13*  CALCIUM 5.8*   < > 6.3*  MG 1.4*   < > 2.0  AST 23  --   --   ALT 11  --   --   ALKPHOS 82  --   --   BILITOT 0.6  --   --    < > = values in this interval not displayed.    Microbiology Results   Recent Results (from the past 240 hour(s))  SARS Coronavirus 2 (CEPHEID- Performed in [place] hospital lab), Hosp Order     Status: None   Collection Time: 09/16/18  8:16 PM   Specimen: Nasopharyngeal Swab  Result Value Ref Range Status   SARS  Coronavirus 2 NEGATIVE NEGATIVE Final    Comment: (NOTE) If result is NEGATIVE SARS-CoV-2 target nucleic acids are NOT DETECTED. The SARS-CoV-2 RNA is generally detectable in upper and lower  respiratory specimens during the acute phase of infection. The lowest  concentration of SARS-CoV-2 viral copies this assay can detect is 250  copies / mL. A negative result does not preclude SARS-CoV-2 infection  and should not be used as the sole basis for treatment or other  patient management decisions.  A negative result may occur with  improper specimen collection / handling, submission of specimen other  than nasopharyngeal swab, presence of viral mutation(s) within the  areas targeted by this assay, and inadequate number of viral copies  (<250 copies / mL). A negative result must be combined with clinical  observations, patient history, and epidemiological information. If result is POSITIVE SARS-CoV-2 target nucleic acids are DETECTED. The SARS-CoV-2 RNA is generally detectable in upper and lower  respiratory specimens dur ing the acute phase of infection.  Positive  results are indicative of active infection with SARS-CoV-2.  Clinical  correlation with patient history and other diagnostic information is  necessary to determine patient infection status.  Positive results do  not rule out bacterial infection or co-infection with other viruses. If result is PRESUMPTIVE POSTIVE SARS-CoV-2 nucleic acids MAY BE PRESENT.   A presumptive positive result was obtained on the submitted specimen  and confirmed on repeat testing.  While 2019 novel coronavirus  (SARS-CoV-2) nucleic acids may be present in the submitted sample  additional confirmatory testing may be necessary for epidemiological  and / or clinical management purposes  to differentiate between  SARS-CoV-2 and other Sarbecovirus currently known to infect humans.  If clinically indicated additional testing with an alternate test   methodology [phone]) is advised. The SARS-CoV-2 RNA is generally  detectable in upper and lower respiratory sp ecimens during the acute  phase of infection. The expected result is Negative. Fact Sheet for Patients:  StrictlyIdeas.no Fact Sheet for Healthcare Providers: BankingDealers.co.za This test is not yet approved or cleared by the [place] FDA and has been authorized for detection and/or diagnosis of SARS-CoV-2 by FDA under an Emergency Use Authorization (EUA).  This EUA will remain in effect (meaning this test can be used) for the duration of the COVID-19 declaration under Section 564(b)(1) of the Act, 21 U.S.C. section  360bbb-3(b)(1), unless the authorization is terminated or revoked sooner. Performed at [facility], [address] [place], [place] [address]     RADIOLOGY:  Dg Chest 2 View  Result Date: 09/16/2018 CLINICAL DATA:  Weakness and hypotension EXAM: CHEST - 2 VIEW COMPARISON:  07/27/16 FINDINGS: Cardiac shadow is within normal limits. Lungs are well aerated bilaterally. No focal infiltrate or sizable effusion is seen. Old rib fractures are noted bilaterally. Degenerative changes of the thoracic spine are noted. IMPRESSION: Chronic changes without acute abnormality. Electronically Signed   By: [name] M.D.   On: 09/16/2018 17:36     CODE STATUS:     Code Status Orders  (From admission, onward)         Start     Ordered   09/16/18 2348  Full code  Continuous     09/16/18 2348        Code Status History    This patient has a current code status but no historical code status.   Advance Care Planning Activity    Advance Directive Documentation     Most Recent Value  Type of Advance Directive  Healthcare Power of Attorney, Living will  Pre-existing out of facility DNR order (yellow form or pink MOST form)  -  "MOST" Form in Place?  -      TOTAL TIME TAKING CARE OF THIS PATIENT: *40*  minutes.    Fritzi Mandes M.D on 09/18/2018 at 10:27 AM  Between 7am to 6pm - Pager - [phone] After 6pm go to [email or web address] - password [email or web address] [facility]  Office  [phone]  CC: Primary care physician; Idelle Crouch, MD

## 2018-09-18 NOTE — Progress Notes (Signed)
Established peritoneal dialysis patient known at [facility]. Patient will resume same schedule at discharge.  Elvera Bicker Dialysis Coordinator [phone]

## 2018-09-19 ENCOUNTER — Other Ambulatory Visit: Payer: Self-pay

## 2018-09-19 ENCOUNTER — Ambulatory Visit
Admission: RE | Admit: 2018-09-19 | Discharge: 2018-09-19 | Disposition: A | Discharge status: Home / Self Care | Payer: Medicare Other | Source: Ambulatory Visit | Attending: Nephrology | Admitting: Nephrology

## 2018-09-19 MED ORDER — CALCIUM GLUCONATE-NACL 1-0.675 GM/50ML-% IV SOLN
1.0000 g | Freq: Once | INTRAVENOUS | Status: AC
  Administered 2018-09-19: 1000 mg via INTRAVENOUS
  Filled 2018-09-19: qty 50

## 2018-11-27 ENCOUNTER — Other Ambulatory Visit: Payer: Self-pay

## 2018-11-27 ENCOUNTER — Other Ambulatory Visit
Admission: RE | Admit: 2018-11-27 | Discharge: 2018-11-27 | Disposition: A | Discharge status: Home / Self Care | Payer: Medicare Other | Source: Ambulatory Visit | Attending: Internal Medicine | Admitting: Internal Medicine

## 2018-11-27 DIAGNOSIS — Z20828 Contact with and (suspected) exposure to other viral communicable diseases: Secondary | ICD-10-CM | POA: Diagnosis not present

## 2018-11-27 DIAGNOSIS — Z01812 Encounter for preprocedural laboratory examination: Secondary | ICD-10-CM | POA: Diagnosis present

## 2018-11-27 LAB — SARS CORONAVIRUS 2 (TAT 6-24 HRS): SARS Coronavirus 2: NEGATIVE

## 2018-12-01 ENCOUNTER — Ambulatory Visit: Payer: Medicare Other | Admitting: Certified Registered Nurse Anesthetist

## 2018-12-01 ENCOUNTER — Encounter
Admission: RE | Disposition: A | Discharge status: Home / Self Care | Payer: Self-pay | Source: Home / Self Care | Attending: Internal Medicine

## 2018-12-01 ENCOUNTER — Encounter: Payer: Self-pay | Admitting: *Deleted

## 2018-12-01 ENCOUNTER — Ambulatory Visit
Admission: RE | Admit: 2018-12-01 | Discharge: 2018-12-01 | Disposition: A | Discharge status: Home / Self Care | Payer: Medicare Other | Attending: Internal Medicine | Admitting: Internal Medicine

## 2018-12-01 DIAGNOSIS — I129 Hypertensive chronic kidney disease with stage 1 through stage 4 chronic kidney disease, or unspecified chronic kidney disease: Secondary | ICD-10-CM | POA: Diagnosis not present

## 2018-12-01 DIAGNOSIS — Z79899 Other long term (current) drug therapy: Secondary | ICD-10-CM | POA: Insufficient documentation

## 2018-12-01 DIAGNOSIS — F419 Anxiety disorder, unspecified: Secondary | ICD-10-CM | POA: Diagnosis not present

## 2018-12-01 DIAGNOSIS — Z1211 Encounter for screening for malignant neoplasm of colon: Secondary | ICD-10-CM | POA: Diagnosis not present

## 2018-12-01 DIAGNOSIS — E785 Hyperlipidemia, unspecified: Secondary | ICD-10-CM | POA: Insufficient documentation

## 2018-12-01 DIAGNOSIS — E1122 Type 2 diabetes mellitus with diabetic chronic kidney disease: Secondary | ICD-10-CM | POA: Diagnosis not present

## 2018-12-01 DIAGNOSIS — D123 Benign neoplasm of transverse colon: Secondary | ICD-10-CM | POA: Diagnosis not present

## 2018-12-01 DIAGNOSIS — J449 Chronic obstructive pulmonary disease, unspecified: Secondary | ICD-10-CM | POA: Insufficient documentation

## 2018-12-01 DIAGNOSIS — F329 Major depressive disorder, single episode, unspecified: Secondary | ICD-10-CM | POA: Diagnosis not present

## 2018-12-01 DIAGNOSIS — Z794 Long term (current) use of insulin: Secondary | ICD-10-CM | POA: Diagnosis not present

## 2018-12-01 DIAGNOSIS — D124 Benign neoplasm of descending colon: Secondary | ICD-10-CM | POA: Diagnosis not present

## 2018-12-01 DIAGNOSIS — K5730 Diverticulosis of large intestine without perforation or abscess without bleeding: Secondary | ICD-10-CM | POA: Insufficient documentation

## 2018-12-01 DIAGNOSIS — Z86010 Personal history of colonic polyps: Secondary | ICD-10-CM | POA: Insufficient documentation

## 2018-12-01 DIAGNOSIS — E039 Hypothyroidism, unspecified: Secondary | ICD-10-CM | POA: Insufficient documentation

## 2018-12-01 DIAGNOSIS — D175 Benign lipomatous neoplasm of intra-abdominal organs: Secondary | ICD-10-CM | POA: Insufficient documentation

## 2018-12-01 DIAGNOSIS — Z79890 Hormone replacement therapy (postmenopausal): Secondary | ICD-10-CM | POA: Insufficient documentation

## 2018-12-01 DIAGNOSIS — N189 Chronic kidney disease, unspecified: Secondary | ICD-10-CM | POA: Insufficient documentation

## 2018-12-01 HISTORY — DX: Hypothyroidism, unspecified: E03.9

## 2018-12-01 HISTORY — DX: Depression, unspecified: F32.A

## 2018-12-01 HISTORY — DX: Anxiety disorder, unspecified: F41.9

## 2018-12-01 HISTORY — PX: COLONOSCOPY WITH PROPOFOL: SHX5780

## 2018-12-01 LAB — GLUCOSE, CAPILLARY: Glucose-Capillary: 208 mg/dL — ABNORMAL HIGH (ref 70–99)

## 2018-12-01 SURGERY — COLONOSCOPY WITH PROPOFOL
Anesthesia: General

## 2018-12-01 MED ORDER — LIDOCAINE HCL (CARDIAC) PF 100 MG/5ML IV SOSY
PREFILLED_SYRINGE | INTRAVENOUS | Status: DC | PRN
  Administered 2018-12-01: 50 mg via INTRAVENOUS

## 2018-12-01 MED ORDER — PROPOFOL 500 MG/50ML IV EMUL
INTRAVENOUS | Status: AC
  Filled 2018-12-01: qty 50

## 2018-12-01 MED ORDER — PROPOFOL 10 MG/ML IV BOLUS
INTRAVENOUS | Status: DC | PRN
  Administered 2018-12-01: 40 mg via INTRAVENOUS

## 2018-12-01 MED ORDER — SODIUM CHLORIDE 0.9 % IV SOLN
INTRAVENOUS | Status: DC
  Administered 2018-12-01: 08:00:00 1000 mL via INTRAVENOUS

## 2018-12-01 MED ORDER — PROPOFOL 500 MG/50ML IV EMUL
INTRAVENOUS | Status: DC | PRN
  Administered 2018-12-01: 140 ug/kg/min via INTRAVENOUS

## 2018-12-01 MED ORDER — PROPOFOL 10 MG/ML IV BOLUS
INTRAVENOUS | Status: AC
  Filled 2018-12-01: qty 20

## 2018-12-01 NOTE — Transfer of Care (Signed)
Immediate Anesthesia Transfer of Care Note  Patient: Kyle [name]  Procedure(s) Performed: COLONOSCOPY WITH PROPOFOL (N/A )  Patient Location: PACU  Anesthesia Type:General  Level of Consciousness: awake, alert  and oriented  Airway & Oxygen Therapy: Patient Spontanous Breathing and Patient connected to nasal cannula oxygen  Post-op Assessment: Report given to RN and Post -op Vital signs reviewed and stable  Post vital signs: Reviewed and stable  Last Vitals:  Vitals Value Taken Time  BP    Temp    Pulse 78 12/01/18 0854  Resp 15 12/01/18 0854  SpO2 97 % 12/01/18 0854  Vitals shown include unvalidated device data.  Last Pain:  Vitals:   12/01/18 0732  TempSrc: Tympanic  PainSc: 0-No pain         Complications: No apparent anesthesia complications

## 2018-12-01 NOTE — Anesthesia Postprocedure Evaluation (Signed)
Anesthesia Post Note  Patient: Kyle [name]  Procedure(s) Performed: COLONOSCOPY WITH PROPOFOL (N/A )  Patient location during evaluation: Endoscopy Anesthesia Type: General Level of consciousness: awake and alert Pain management: pain level controlled Vital Signs Assessment: post-procedure vital signs reviewed and stable Respiratory status: spontaneous breathing and respiratory function stable Cardiovascular status: stable Anesthetic complications: no     Last Vitals:  Vitals:   12/01/18 0732 12/01/18 0854  BP: 138/77 113/84  Pulse: 89 77  Resp: 20 (!) 29  Temp: 36.5 C (!) 36.3 C  SpO2: 100% 97%    Last Pain:  Vitals:   12/01/18 0854  TempSrc: Tympanic  PainSc:                  KEPHART,WILLIAM K

## 2018-12-01 NOTE — H&P (Signed)
Outpatient short stay form Pre-procedure 12/01/2018 8:21 AM Kyle [name] Kyle [name], M.D.  Primary Physician: Kyle [name], M.D.  Reason for visit: Personal hx of adenomatous colon polyps  History of present illness:                            Patient presents for colonoscopy for a personal hx of colon polyps. The patient denies abdominal pain, abnormal weight loss or rectal bleeding.      Current Facility-Administered Medications:  .  0.9 %  sodium chloride infusion, , Intravenous, Continuous, [place], Kyle Pike, MD, Last Rate: 20 mL/hr at 12/01/18 0754, 1,000 mL at 12/01/18 0754  Medications Prior to Admission  Medication Sig Dispense Refill Last Dose  . levothyroxine (SYNTHROID) 150 MCG tablet Take 1 tablet (150 mcg total) by mouth daily. 30 tablet 0 11/30/2018 at Unknown time  . metoprolol succinate (TOPROL-XL) 50 MG 24 hr tablet Take 1 tablet by mouth daily.     . Amylase-Lipase-Protease (CREON 10 PO) Take by mouth daily as needed.     Kyle [name] atorvastatin (LIPITOR) 10 MG tablet Take 10 mg by mouth daily.     . calcium carbonate (TUMS) 500 MG chewable tablet Chew 1 tablet (200 mg of elemental calcium total) by mouth 3 (three) times daily with meals. 120 tablet 3   . escitalopram (LEXAPRO) 10 MG tablet Take 10 mg by mouth daily.     Kyle [name] glipiZIDE (GLUCOTROL XL) 10 MG 24 hr tablet Take 10 mg by mouth daily.     . insulin glargine (LANTUS) 100 unit/mL SOPN Inject 20 Units into the skin daily.     . insulin lispro (HUMALOG) 100 UNIT/ML KwikPen Inject 4-14 Units into the skin daily. Sliding scale     . levETIRAcetam (KEPPRA) 500 MG tablet Take 500 mg by mouth 2 (two) times a day.     . losartan (COZAAR) 100 MG tablet Take 100 mg by mouth daily.     . Pancrelipase, Lip-Prot-Amyl, 24000-76000 units CPEP Take 1 capsule by mouth 3 (three) times daily.     . potassium chloride SA (K-DUR) 20 MEQ tablet Take 1 tablet (20 mEq total) by mouth 2 (two) times a day. 90 tablet 0   . Semaglutide,0.25 or  0.5MG /DOS, 2 MG/1.5ML SOPN Inject 0.5 mg into the skin once a week.        No Known Allergies   Past Medical History:  Diagnosis Date  . Anxiety   . BPH (benign prostatic hyperplasia)   . Chronic kidney disease   . COPD (chronic obstructive pulmonary disease) (Kyle [name])   . Depression   . Diabetes mellitus without complication (Kyle [name])   . Hyperlipidemia   . Hypertension   . Hypothyroidism   . Nephrolithiasis   . Seizures (Kyle [name])     Review of systems:  Otherwise negative.    Physical Exam  Gen: Alert, oriented. Appears stated age.  HEENT: [place]/AT. PERRLA. Lungs: CTA, no wheezes. CV: RR nl S1, S2. Abd: soft, benign, no masses. BS+ Ext: No edema. Pulses 2+    Planned procedures: Proceed with colonoscopy. The patient understands the nature of the planned procedure, indications, risks, alternatives and potential complications including but not limited to bleeding, infection, perforation, damage to internal organs and possible oversedation/side effects from anesthesia. The patient agrees and gives consent to proceed.  Please refer to procedure notes for findings, recommendations and patient disposition/instructions.     Kyle [name] Kyle [name], M.D. Gastroenterology 12/01/2018  8:21  AM     

## 2018-12-01 NOTE — Anesthesia Post-op Follow-up Note (Signed)
Anesthesia QCDR form completed.        

## 2018-12-01 NOTE — Op Note (Signed)
[facility] Gastroenterology Patient Name: Kyle [name] Procedure Date: 12/01/2018 7:58 AM MRN: [id number] Account #: [id number] Date of Birth: 1951-01-23 Admit Type: Outpatient Age: 68 Room: [facility] ENDO ROOM 3 Gender: Male Note Status: Finalized Procedure:            Colonoscopy Indications:          High risk colon cancer surveillance: Personal history                        of multiple (3 or more) adenomas Providers:            Lorie Apley [name] MD, MD Referring MD:         Leonie Douglas. Doy Hutching, MD (Referring MD) Medicines:            Propofol per Anesthesia Complications:        No immediate complications. Procedure:            Pre-Anesthesia Assessment:                       - The risks and benefits of the procedure and the                        sedation options and risks were discussed with the                        patient. All questions were answered and informed                        consent was obtained.                       - Patient identification and proposed procedure were                        verified prior to the procedure by the nurse. The                        procedure was verified in the procedure room.                       - ASA Grade Assessment: III - A patient with severe                        systemic disease.                       - After reviewing the risks and benefits, the patient                        was deemed in satisfactory condition to undergo the                        procedure.                       After obtaining informed consent, the colonoscope was                        passed under direct vision. Throughout the procedure,  the patient's blood pressure, pulse, and oxygen                        saturations were monitored continuously. The                        Colonoscope was introduced through the anus and                        advanced to the the cecum, identified by appendiceal              orifice and ileocecal valve. The colonoscopy was                        performed without difficulty. The patient tolerated the                        procedure well. The quality of the bowel preparation                        was adequate. The ileocecal valve, appendiceal orifice,                        and rectum were photographed. Findings:      The perianal and digital rectal examinations were normal. Pertinent       negatives include normal sphincter tone and no palpable rectal lesions.      Multiple small and large-mouthed diverticula were found in the entire       colon. There was no evidence of diverticular bleeding.      Two sessile polyps were found in the transverse colon. The polyps were 4       to 5 mm in size. These polyps were removed with a cold biopsy forceps.       Resection and retrieval were complete.      There was a small lipoma, 12 mm in diameter, in the transverse colon.       Biopsies were taken with a cold forceps for histology.      A 15 mm polyp was found in the distal transverse colon. The polyp was       semi-pedunculated. The polyp was removed with a piecemeal technique       using a hot snare at 20 watts. Resection and retrieval were complete       using a suction (via the working channel). [Clip Device].      A 6 mm polyp was found in the descending colon. The polyp was sessile.       The polyp was removed with a cold snare. Resection and retrieval were       complete.      The exam was otherwise without abnormality on direct and retroflexion       views. Impression:           - Moderate diverticulosis in the entire examined colon.                        There was no evidence of diverticular bleeding.                       - Two 4 to 5 mm polyps in the transverse colon, removed  with a cold biopsy forceps. Resected and retrieved.                       - Small lipoma in the transverse colon. Biopsied.                        - One 15 mm polyp in the distal transverse colon,                        removed piecemeal using a hot snare. Resected and                        retrieved.                       - One 6 mm polyp in the descending colon, removed with                        a cold snare. Resected and retrieved.                       - The examination was otherwise normal on direct and                        retroflexion views. Recommendation:       - Patient has a contact number available for                        emergencies. The signs and symptoms of potential                        delayed complications were discussed with the patient.                        Return to normal activities tomorrow. Written discharge                        instructions were provided to the patient.                       - Resume previous diet.                       - Continue present medications.                       - Repeat colonoscopy is recommended for surveillance.                        The colonoscopy date will be determined after pathology                        results from today's exam become available for review.                       - Return to GI office PRN. Procedure Code(s):    --- Professional ---                       [phone], Colonoscopy, flexible; with removal of tumor(s),  polyp(s), or other lesion(s) by snare technique                       45380, 59, Colonoscopy, flexible; with biopsy, single                        or multiple Diagnosis Code(s):    --- Professional ---                       K57.30, Diverticulosis of large intestine without                        perforation or abscess without bleeding                       D17.5, Benign lipomatous neoplasm of intra-abdominal                        organs                       Z86.010, Personal history of colonic polyps                       K63.5, Polyp of colon CPT copyright 2019 American Medical Association. All rights  reserved. The codes documented in this report are preliminary and upon coder review may  be revised to meet current compliance requirements. Efrain Sella MD, MD 12/01/2018 8:53:19 AM This report has been signed electronically. Number of Addenda: 0 Note Initiated On: 12/01/2018 7:58 AM Scope Withdrawal Time: 0 hours 18 minutes 30 seconds  Total Procedure Duration: 0 hours 23 minutes 33 seconds  Estimated Blood Loss: Estimated blood loss: none.      [facility]

## 2018-12-01 NOTE — Anesthesia Preprocedure Evaluation (Signed)
Anesthesia Evaluation  Patient identified by MRN, date of birth, ID band Patient awake    Reviewed: Allergy & Precautions, NPO status , Patient's Chart, lab work & pertinent test results  History of Anesthesia Complications Negative for: history of anesthetic complications  Airway Mallampati: II       Dental   Pulmonary neg sleep apnea, COPD,  COPD inhaler, Current Smoker,           Cardiovascular hypertension, Pt. on medications (-) Past MI and (-) CHF (-) dysrhythmias (-) Valvular Problems/Murmurs     Neuro/Psych Seizures - (last 5 yrs ago), Well Controlled,     GI/Hepatic Neg liver ROS, neg GERD  ,  Endo/Other  diabetes, Type 2, Oral Hypoglycemic Agents, Insulin DependentHypothyroidism   Renal/GU ESRF and DialysisRenal disease (stones, perotoneal dialysis)     Musculoskeletal   Abdominal   Peds  Hematology  (+) anemia ,   Anesthesia Other Findings   Reproductive/Obstetrics                            Anesthesia Physical Anesthesia Plan  ASA: III  Anesthesia Plan: General   Post-op Pain Management:    Induction: Intravenous  PONV Risk Score and Plan: 1 and Propofol infusion and TIVA  Airway Management Planned: Nasal Cannula  Additional Equipment:   Intra-op Plan:   Post-operative Plan:   Informed Consent: I have reviewed the patients History and Physical, chart, labs and discussed the procedure including the risks, benefits and alternatives for the proposed anesthesia with the patient or authorized representative who has indicated his/her understanding and acceptance.       Plan Discussed with:   Anesthesia Plan Comments:         Anesthesia Quick Evaluation

## 2018-12-01 NOTE — Interval H&P Note (Signed)
History and Physical Interval Note:  12/01/2018 8:21 AM  Kyle [name]  has presented today for surgery, with the diagnosis of PERSONAL HX.OF COLON POLYPS.  The various methods of treatment have been discussed with the patient and family. After consideration of risks, benefits and other options for treatment, the patient has consented to  Procedure(s): COLONOSCOPY WITH PROPOFOL (N/A) as a surgical intervention.  The patient's history has been reviewed, patient examined, no change in status, stable for surgery.  I have reviewed the patient's chart and labs.  Questions were answered to the patient's satisfaction.     [place], [place]

## 2018-12-02 ENCOUNTER — Encounter: Payer: Self-pay | Admitting: Internal Medicine

## 2018-12-03 LAB — SURGICAL PATHOLOGY

## 2019-03-10 DIAGNOSIS — H547 Unspecified visual loss: Secondary | ICD-10-CM | POA: Insufficient documentation

## 2019-03-10 DIAGNOSIS — R270 Ataxia, unspecified: Secondary | ICD-10-CM | POA: Insufficient documentation

## 2019-03-10 DIAGNOSIS — R42 Dizziness and giddiness: Secondary | ICD-10-CM | POA: Insufficient documentation

## 2019-03-12 ENCOUNTER — Other Ambulatory Visit: Payer: Self-pay

## 2019-03-12 ENCOUNTER — Encounter: Payer: Self-pay | Admitting: *Deleted

## 2019-03-12 ENCOUNTER — Inpatient Hospital Stay
Admission: EM | Admit: 2019-03-12 | Discharge: 2019-03-20 | DRG: 640 | Disposition: A | Discharge status: Rehabilitation Facility | Payer: Medicare PPO | Attending: Internal Medicine | Admitting: Internal Medicine

## 2019-03-12 DIAGNOSIS — R531 Weakness: Secondary | ICD-10-CM

## 2019-03-12 DIAGNOSIS — E1142 Type 2 diabetes mellitus with diabetic polyneuropathy: Secondary | ICD-10-CM

## 2019-03-12 DIAGNOSIS — R9431 Abnormal electrocardiogram [ECG] [EKG]: Secondary | ICD-10-CM | POA: Diagnosis present

## 2019-03-12 DIAGNOSIS — Z833 Family history of diabetes mellitus: Secondary | ICD-10-CM

## 2019-03-12 DIAGNOSIS — I639 Cerebral infarction, unspecified: Secondary | ICD-10-CM

## 2019-03-12 DIAGNOSIS — G40909 Epilepsy, unspecified, not intractable, without status epilepticus: Secondary | ICD-10-CM

## 2019-03-12 DIAGNOSIS — E1151 Type 2 diabetes mellitus with diabetic peripheral angiopathy without gangrene: Secondary | ICD-10-CM | POA: Diagnosis present

## 2019-03-12 DIAGNOSIS — J449 Chronic obstructive pulmonary disease, unspecified: Secondary | ICD-10-CM | POA: Diagnosis not present

## 2019-03-12 DIAGNOSIS — E890 Postprocedural hypothyroidism: Secondary | ICD-10-CM | POA: Diagnosis present

## 2019-03-12 DIAGNOSIS — N186 End stage renal disease: Secondary | ICD-10-CM | POA: Diagnosis present

## 2019-03-12 DIAGNOSIS — I120 Hypertensive chronic kidney disease with stage 5 chronic kidney disease or end stage renal disease: Secondary | ICD-10-CM | POA: Diagnosis present

## 2019-03-12 DIAGNOSIS — Z801 Family history of malignant neoplasm of trachea, bronchus and lung: Secondary | ICD-10-CM

## 2019-03-12 DIAGNOSIS — Z79899 Other long term (current) drug therapy: Secondary | ICD-10-CM

## 2019-03-12 DIAGNOSIS — I739 Peripheral vascular disease, unspecified: Secondary | ICD-10-CM | POA: Diagnosis present

## 2019-03-12 DIAGNOSIS — H02402 Unspecified ptosis of left eyelid: Secondary | ICD-10-CM | POA: Clinically undetermined

## 2019-03-12 DIAGNOSIS — Z794 Long term (current) use of insulin: Secondary | ICD-10-CM

## 2019-03-12 DIAGNOSIS — N185 Chronic kidney disease, stage 5: Secondary | ICD-10-CM | POA: Diagnosis not present

## 2019-03-12 DIAGNOSIS — Z79890 Hormone replacement therapy (postmenopausal): Secondary | ICD-10-CM

## 2019-03-12 DIAGNOSIS — D631 Anemia in chronic kidney disease: Secondary | ICD-10-CM | POA: Diagnosis present

## 2019-03-12 DIAGNOSIS — E1122 Type 2 diabetes mellitus with diabetic chronic kidney disease: Secondary | ICD-10-CM | POA: Diagnosis present

## 2019-03-12 DIAGNOSIS — H4900 Third [oculomotor] nerve palsy, unspecified eye: Secondary | ICD-10-CM | POA: Diagnosis present

## 2019-03-12 DIAGNOSIS — Z825 Family history of asthma and other chronic lower respiratory diseases: Secondary | ICD-10-CM

## 2019-03-12 DIAGNOSIS — E1159 Type 2 diabetes mellitus with other circulatory complications: Secondary | ICD-10-CM | POA: Diagnosis present

## 2019-03-12 DIAGNOSIS — E1140 Type 2 diabetes mellitus with diabetic neuropathy, unspecified: Secondary | ICD-10-CM | POA: Diagnosis present

## 2019-03-12 DIAGNOSIS — E119 Type 2 diabetes mellitus without complications: Secondary | ICD-10-CM

## 2019-03-12 DIAGNOSIS — E039 Hypothyroidism, unspecified: Secondary | ICD-10-CM

## 2019-03-12 DIAGNOSIS — R29701 NIHSS score 1: Secondary | ICD-10-CM | POA: Diagnosis not present

## 2019-03-12 DIAGNOSIS — F17210 Nicotine dependence, cigarettes, uncomplicated: Secondary | ICD-10-CM | POA: Diagnosis present

## 2019-03-12 DIAGNOSIS — F329 Major depressive disorder, single episode, unspecified: Secondary | ICD-10-CM | POA: Diagnosis present

## 2019-03-12 DIAGNOSIS — Z992 Dependence on renal dialysis: Secondary | ICD-10-CM

## 2019-03-12 DIAGNOSIS — Z9119 Patient's noncompliance with other medical treatment and regimen: Secondary | ICD-10-CM

## 2019-03-12 DIAGNOSIS — E785 Hyperlipidemia, unspecified: Secondary | ICD-10-CM | POA: Diagnosis present

## 2019-03-12 DIAGNOSIS — R569 Unspecified convulsions: Secondary | ICD-10-CM

## 2019-03-12 DIAGNOSIS — I152 Hypertension secondary to endocrine disorders: Secondary | ICD-10-CM | POA: Diagnosis present

## 2019-03-12 DIAGNOSIS — E1169 Type 2 diabetes mellitus with other specified complication: Secondary | ICD-10-CM | POA: Diagnosis present

## 2019-03-12 DIAGNOSIS — K529 Noninfective gastroenteritis and colitis, unspecified: Secondary | ICD-10-CM | POA: Diagnosis present

## 2019-03-12 DIAGNOSIS — R5381 Other malaise: Secondary | ICD-10-CM | POA: Diagnosis present

## 2019-03-12 DIAGNOSIS — E872 Acidosis: Secondary | ICD-10-CM | POA: Diagnosis present

## 2019-03-12 DIAGNOSIS — E876 Hypokalemia: Secondary | ICD-10-CM | POA: Diagnosis present

## 2019-03-12 DIAGNOSIS — N2581 Secondary hyperparathyroidism of renal origin: Secondary | ICD-10-CM | POA: Diagnosis present

## 2019-03-12 DIAGNOSIS — Z20822 Contact with and (suspected) exposure to covid-19: Secondary | ICD-10-CM | POA: Diagnosis present

## 2019-03-12 DIAGNOSIS — N400 Enlarged prostate without lower urinary tract symptoms: Secondary | ICD-10-CM | POA: Diagnosis present

## 2019-03-12 DIAGNOSIS — Z6841 Body Mass Index (BMI) 40.0 and over, adult: Secondary | ICD-10-CM

## 2019-03-12 DIAGNOSIS — I452 Bifascicular block: Secondary | ICD-10-CM | POA: Diagnosis present

## 2019-03-12 HISTORY — DX: Diverticulosis of large intestine without perforation or abscess without bleeding: K57.30

## 2019-03-12 HISTORY — DX: Polyp of colon: K63.5

## 2019-03-12 LAB — CBC WITH DIFFERENTIAL/PLATELET
Abs Immature Granulocytes: 0.08 10*3/uL — ABNORMAL HIGH (ref 0.00–0.07)
Basophils Absolute: 0.1 10*3/uL (ref 0.0–0.1)
Basophils Relative: 1 %
Eosinophils Absolute: 0.1 10*3/uL (ref 0.0–0.5)
Eosinophils Relative: 1 %
HCT: 26.5 % — ABNORMAL LOW (ref 39.0–52.0)
Hemoglobin: 9 g/dL — ABNORMAL LOW (ref 13.0–17.0)
Immature Granulocytes: 1 %
Lymphocytes Relative: 17 %
Lymphs Abs: 1.3 10*3/uL (ref 0.7–4.0)
MCH: 32.5 pg (ref 26.0–34.0)
MCHC: 34 g/dL (ref 30.0–36.0)
MCV: 95.7 fL (ref 80.0–100.0)
Monocytes Absolute: 0.4 10*3/uL (ref 0.1–1.0)
Monocytes Relative: 5 %
Neutro Abs: 6 10*3/uL (ref 1.7–7.7)
Neutrophils Relative %: 75 %
Platelets: 209 10*3/uL (ref 150–400)
RBC: 2.77 MIL/uL — ABNORMAL LOW (ref 4.22–5.81)
RDW: 15.5 % (ref 11.5–15.5)
WBC: 8.1 10*3/uL (ref 4.0–10.5)
nRBC: 0.2 % (ref 0.0–0.2)

## 2019-03-12 LAB — BASIC METABOLIC PANEL
Anion gap: 18 — ABNORMAL HIGH (ref 5–15)
BUN: 67 mg/dL — ABNORMAL HIGH (ref 8–23)
CO2: 14 mmol/L — ABNORMAL LOW (ref 22–32)
Calcium: 5.6 mg/dL — CL (ref 8.9–10.3)
Chloride: 103 mmol/L (ref 98–111)
Creatinine, Ser: 10.31 mg/dL — ABNORMAL HIGH (ref 0.61–1.24)
GFR calc Af Amer: 5 mL/min — ABNORMAL LOW (ref >60)
GFR calc non Af Amer: 5 mL/min — ABNORMAL LOW (ref >60)
Glucose, Bld: 136 mg/dL — ABNORMAL HIGH (ref 70–99)
Potassium: 3.8 mmol/L (ref 3.5–5.1)
Sodium: 135 mmol/L (ref 135–145)

## 2019-03-12 LAB — MAGNESIUM: Magnesium: 1.4 mg/dL — ABNORMAL LOW (ref 1.7–2.4)

## 2019-03-12 LAB — PHOSPHORUS: Phosphorus: 9.1 mg/dL — ABNORMAL HIGH (ref 2.5–4.6)

## 2019-03-12 MED ORDER — MAGNESIUM SULFATE 2 GM/50ML IV SOLN
2.0000 g | Freq: Once | INTRAVENOUS | Status: AC
  Administered 2019-03-12: 2 g via INTRAVENOUS
  Filled 2019-03-12: qty 50

## 2019-03-12 MED ORDER — SODIUM CHLORIDE 0.9 % IV SOLN: 1.0000 g | Freq: Once | INTRAVENOUS | Status: DC

## 2019-03-12 MED ORDER — HEPARIN SODIUM (PORCINE) 5000 UNIT/ML IJ SOLN
5000.0000 [IU] | Freq: Three times a day (TID) | INTRAMUSCULAR | Status: DC
  Administered 2019-03-12 – 2019-03-20 (×22): 5000 [IU] via SUBCUTANEOUS
  Filled 2019-03-12 (×22): qty 1

## 2019-03-12 MED ORDER — CALCIUM GLUCONATE-NACL 1-0.675 GM/50ML-% IV SOLN
1.0000 g | Freq: Once | INTRAVENOUS | Status: AC
  Administered 2019-03-12: 1000 mg via INTRAVENOUS
  Filled 2019-03-12: qty 50

## 2019-03-12 MED ORDER — CALCIUM ACETATE (PHOS BINDER) 667 MG PO CAPS
1334.0000 mg | ORAL_CAPSULE | Freq: Three times a day (TID) | ORAL | Status: DC
  Administered 2019-03-13 – 2019-03-20 (×21): 1334 mg via ORAL
  Filled 2019-03-12 (×24): qty 2

## 2019-03-12 NOTE — H&P (Addendum)
History and Physical    Kyle [name] UVO:[id number] DOB: September 02, 1950 DOA: 03/12/2019  PCP: Idelle Crouch, MD  Patient coming from: home, lives with wife  I have personally briefly reviewed patient's old medical records in [place]  Chief Complaint: confusion, abnormal labs  HPI: Kyle [name] is a 69 y.o. 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  Patient has been following with GI for persistent diarrhea and bowel incontinence. He had routine blood work done and noted to have significant hypocalcemia and was advised by GI to come to ED.  He has had hypocalcemia in the past requiring admission. Takes tums TID with meal at home. He notes he has been feeling more confused and weak although this has been ongoing since he started dialysis. Notes increase fall about once weekly and is being evaluated by neurology outpatient.  Wife endorse to nurse that he has missed dialysis since Monday. He notes to me that he also occasionally skips it because he "doesn't feel like it." Denies any nausea, vomiting or abdominal pain. No headache or fever.   Smokes half a pack daily for the past 30 years. Has daily mixed alcohol drinks. No illicit drugs.    ED Course:  He was afebrile, hypertensive up to 170/90 on room air.  WBC of 8.1. Hemoglobin of 9 from baseline of about 10.  Na of 135, K of 3.8, glucose of 136, creatinine of 10.31 from baseline of about 6, Calcium of 5.6. Anion gap of 18. Phosphorus of 9.1. Magnesium of 1.4  Review of Systems:  Constitutional: No Weight Change, No Fever ENT/Mouth: No sore throat, No Rhinorrhea Eyes: No Vision Changes Cardiovascular: No Chest Pain, no SOB Respiratory: No Cough, No Sputum,   Gastrointestinal: No Nausea, No Vomiting, + Diarrhea, No Constipation, No Pain Genitourinary: +bowel incontinence Musculoskeletal: No Arthralgias, No Myalgias Skin: No  Skin Lesions, No Pruritus, Neuro:+ Weakness, No Numbness,  No Loss of Consciousness, No Syncope Psych: No Anxiety/Panic, No Depression, no decrease appetite Heme/Lymph: No Bruising, No Bleeding  Past Medical History:  Diagnosis Date  . Anxiety   . BPH (benign prostatic hyperplasia)   . Chronic kidney disease   . COPD (chronic obstructive pulmonary disease) ([place])   . Depression   . Diabetes mellitus without complication ([place])   . Hyperlipidemia   . Hypertension   . Hypothyroidism   . Nephrolithiasis   . Seizures ([place])     Past Surgical History:  Procedure Laterality Date  . COLONOSCOPY    . COLONOSCOPY WITH PROPOFOL N/A 05/30/2015   Procedure: COLONOSCOPY WITH PROPOFOL;  Surgeon: Manya Silvas, MD;  Location[facility] ENDOSCOPY;  Service: Endoscopy;  Laterality: N/A;  . COLONOSCOPY WITH PROPOFOL N/A 12/01/2018   Procedure: COLONOSCOPY WITH PROPOFOL;  Surgeon: [place], [name], MD;  Location: [facility] ENDOSCOPY;  Service: Gastroenterology;  Laterality: N/A;  . kidney stone    . THYROIDECTOMY    . VARICOCELE EXCISION    . VIDEO BRONCHOSCOPY Bilateral 05/25/2016   Procedure: VIDEO BRONCHOSCOPY WITHOUT FLUORO;  Surgeon: Juanito Doom, MD;  Location[facility]COPY;  Service: Cardiopulmonary;  Laterality: Bilateral;     reports that he has been smoking cigarettes. He has a 40.00 pack-year smoking history. He has never used smokeless tobacco. He reports current alcohol use of about 8.0 standard drinks of alcohol per week. He reports that he does not use drugs.  No Known Allergies  Family History  Problem Relation Age of Onset  . Diabetes Mother   . Diabetes Maternal Grandmother   . Diabetes Maternal Grandfather   . Lung cancer Father   . Emphysema Paternal Grandfather      Prior to Admission medications   Medication Sig Start Date End Date Taking? Authorizing Provider  atorvastatin (LIPITOR) 10 MG tablet Take 10 mg by mouth daily. 04/08/15  Yes [provider]    furosemide (LASIX) 80 MG tablet Take 80 mg by mouth daily.   Yes [provider]  gabapentin (NEURONTIN) 100 MG capsule TAKE 1 CAPSULE BY MOUTH TWICE A DAY FOR ONE WEEK, THEN INCREASE TO 2 CAPSULES TWICE A DAY AND CONTINUE AS DIRECTED 03/05/19  Yes [provider]  glipiZIDE (GLUCOTROL XL) 10 MG 24 hr tablet Take 10 mg by mouth daily. 03/29/15  Yes [provider]  HYDROcodone-acetaminophen (NORCO/VICODIN) 5-325 MG tablet Take 1 tablet by mouth every 4 (four) hours as needed for moderate pain.   Yes [provider]  insulin glargine (LANTUS) 100 unit/mL SOPN Inject 20 Units into the skin daily.   Yes [provider]  insulin lispro (HUMALOG) 100 UNIT/ML KwikPen Inject 4-14 Units into the skin daily. Sliding scale 08/27/16  Yes [provider]  levETIRAcetam (KEPPRA) 500 MG tablet Take 500 mg by mouth 2 (two) times a day. 09/05/15  Yes [provider]  levothyroxine (SYNTHROID) 200 MCG tablet Take 200 mcg by mouth every morning. 02/25/19  Yes [provider]  losartan (COZAAR) 100 MG tablet Take 100 mg by mouth daily. 09/12/18  Yes [provider]  metoprolol succinate (TOPROL-XL) 50 MG 24 hr tablet Take 1 tablet by mouth daily. 03/15/15 03/12/19 Yes [provider]  Pancrelipase, Lip-Prot-Amyl, 24000-76000 units CPEP Take 1 capsule by mouth 3 (three) times daily.   Yes [provider]  Semaglutide,0.25 or 0.5MG /DOS, 2 MG/1.5ML SOPN Inject 0.5 mg into the skin once a week. 06/26/17  Yes [provider]  traZODone (DESYREL) 50 MG tablet Take 100 mg by mouth at bedtime.   Yes [provider]  Amylase-Lipase-Protease (CREON 10 PO) Take by mouth daily as needed.    [provider]  calcium carbonate (TUMS) 500 MG chewable tablet Chew 1 tablet (200 mg of elemental calcium total) by mouth 3 (three) times daily with meals. Patient not taking: Reported on 03/12/2019 09/18/18 09/18/19  Fritzi Mandes,  MD  escitalopram (LEXAPRO) 10 MG tablet Take 10 mg by mouth daily.    [provider]  potassium chloride SA (K-DUR) 20 MEQ tablet Take 1 tablet (20 mEq total) by mouth 2 (two) times a day. Patient not taking: Reported on 03/12/2019 09/18/18   Fritzi Mandes, MD    Physical Exam: Vitals:   03/12/19 2000 03/12/19 2030 03/12/19 2100 03/12/19 2130  BP: (!) 177/103 (!) 176/84 (!) 156/93 (!) 174/90  Pulse: 81 80 83 80  Resp: 20 17 (!) 21 (!) 24  Temp:      TempSrc:      SpO2: 99% 100% 100% 100%  Weight:      Height:        Constitutional: NAD, calm, comfortable, laying flat in bed. Appears restless.  Vitals:   03/12/19 2000 03/12/19 2030 03/12/19 2100 03/12/19 2130  BP: (!) 177/103 (!) 176/84 (!) 156/93 (!) 174/90  Pulse: 81 80 83 80  Resp: 20 17 (!) 21 (!) 24  Temp:      TempSrc:      SpO2: 99% 100% 100% 100%  Weight:      Height:       Eyes: PERRL, lids and conjunctivae normal ENMT: Mucous membranes are moist.  Neck: normal, supple Respiratory: clear to auscultation bilaterally, no wheezing, no crackles. Normal respiratory effort.  Cardiovascular: Regular rate and rhythm, no murmurs / rubs / gallops. No extremity edema.   Abdomen: no tenderness, no masses palpated.  Bowel sounds positive.  Musculoskeletal: no clubbing / cyanosis. No joint deformity upper and lower extremities. Good ROM, no contractures. Normal muscle tone.  Skin: no rashes, lesions, ulcers. No induration Neurologic: CN 2-12 grossly intact. Sensation intact. Strength 5/5 in all 4.  Psychiatric: Normal judgment and insight. Alert and oriented x 3. Normal mood.     Labs on Admission: I have personally reviewed following labs and imaging studies  CBC: Recent Labs  Lab 03/12/19 1947  WBC 8.1  NEUTROABS 6.0  HGB 9.0*  HCT 26.5*  MCV 95.7  PLT 322   Basic Metabolic Panel: Recent Labs  Lab 03/12/19 1947  NA 135  K 3.8  CL 103  CO2 14*  GLUCOSE 136*  BUN 67*  CREATININE 10.31*  CALCIUM 5.6*   MG 1.4*  PHOS 9.1*   GFR: Estimated Creatinine Clearance: 7.9 mL/min (A) (by C-G formula based on SCr of 10.31 mg/dL (H)). Liver Function Tests: No results for input(s): AST, ALT, ALKPHOS, BILITOT, PROT, ALBUMIN in the last 168 hours. No results for input(s): LIPASE, AMYLASE in the last 168 hours. No results for input(s): AMMONIA in the last 168 hours. Coagulation Profile: No results for input(s): INR, PROTIME in the last 168 hours. Cardiac Enzymes: No results for input(s): CKTOTAL, CKMB, CKMBINDEX, TROPONINI in the last 168 hours. BNP (last 3 results) No results for input(s): PROBNP in the last 8760 hours. HbA1C: No results for input(s): HGBA1C in the last 72 hours. CBG: No results for input(s): GLUCAP in the last 168 hours. Lipid Profile: No results for input(s): CHOL, HDL, LDLCALC, TRIG, CHOLHDL, LDLDIRECT in the last 72 hours. Thyroid Function Tests: No results for input(s): TSH, T4TOTAL, FREET4, T3FREE, THYROIDAB in the last 72 hours. Anemia Panel: No results for input(s): VITAMINB12, FOLATE, FERRITIN, TIBC, IRON, RETICCTPCT in the last 72 hours. Urine analysis:    Component Value Date/Time   COLORURINE YELLOW (A) 07/27/2016 1648   APPEARANCEUR CLEAR (A) 07/27/2016 1648   APPEARANCEUR Clear 08/08/2013 1947   LABSPEC 1.021 07/27/2016 1648   LABSPEC 1.012 08/08/2013 1947   PHURINE 5.0 07/27/2016 1648   GLUCOSEU >=500 (A) 07/27/2016 1648   GLUCOSEU 50 mg/dL 08/08/2013 1947   HGBUR SMALL (A) 07/27/2016 1648   BILIRUBINUR NEGATIVE 07/27/2016 1648   BILIRUBINUR Negative 08/08/2013 1947   KETONESUR NEGATIVE 07/27/2016 1648   PROTEINUR >=300 (A) 07/27/2016 1648   NITRITE NEGATIVE 07/27/2016 1648   LEUKOCYTESUR NEGATIVE 07/27/2016 1648   LEUKOCYTESUR Negative 08/08/2013 1947    Radiological Exams on Admission: No results found.  EKG: Independently reviewed.   Assessment/Plan  Hypocalcemia repleted with IV calcium daily calcium  acetate  Hypomagnesium repleted  Hyperphosphatemia I spoke with nephrology Dr. Juleen China and will start calcium acetate TID instead of tums for binding  Elevated anion gap from missed dialysis. Will need to dialysis tomorrow. 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 Will dialyzed tomorrow.   Borderline prolonged QT continue to monitor and replete electrolytes  anemia of chronic disease  stable at  9 monitor  Hx of seizures  Continue Keppra  COPD not in exacerbation   HTN   elevated  continue losartan and metoprolol  Hold lasix due to prolonged QT  Depression hold Lexapro and trazodone for now due to borderline QT prolongation   Type 2 diabetes with neuropathy normally on 20 units lantus daily, humalog sliding scale BG of 136 on admit. Start moderate SSI for now. continue gabapentin  Hypothyroidism continue levothyroxine   Chronic diarrhea continue creon follows with GI outpatient  HLD continue statin  DVT prophylaxis: Heparin Code Status:Full Family Communication: Plan discussed with patient at bedside  disposition Plan: Home with at least 2 midnight stays  Consults called: neurology Admission status: inpatient  Yisell Sprunger T Karlo Goeden DO [facility]   If 7PM-7AM, please contact night-coverage [email or web address] Password [facility]  03/12/2019, 10:12 PM

## 2019-03-12 NOTE — ED Triage Notes (Signed)
Per EMS report, patient's wife states patient has been dizzy and weak. Patient's GI doctor called today and suggested the patient come to the hospital for low electrolytes. Patient is alert and oriented upon arrival.

## 2019-03-12 NOTE — Progress Notes (Signed)
Pt's wife reported Pt is dialysis pt who receives treatment at home nightly. Pt has not had treatment since Monday night per wife. He is followed by Dr. Eduard Clos and [facility] in [place].

## 2019-03-12 NOTE — ED Notes (Signed)
Dr. Charna Archer aware of Calcium of 5.6.

## 2019-03-12 NOTE — ED Provider Notes (Addendum)
[facility] Emergency Department Provider Note   ____________________________________________   First MD Initiated Contact with Patient 03/12/19 1924     (approximate)  I have reviewed the triage vital signs and the nursing notes.   HISTORY  Chief Complaint Dizziness    HPI Kyle [name] is a 69 y.o. male with past medical history of ESRD on PD, COPD, hypertension, hyperlipidemia, and diabetes who presents to the ED complaining of dizziness and weakness.  Patient reports that he has been feeling very lightheaded and weak for about the past 24 hours.  He had an episode earlier today where he was eating at a restaurant and suddenly felt very shaky, " like I was going to have a seizure".  He reports his last seizure being over 5 years ago and did not end up having a seizure episode today, states he has been compliant with his Keppra.  He had an appointment with his GI doctor later in the day for chronic diarrhea and had lab work performed at that time.  He then received a call this evening that his calcium was critically low.  He denies any recent fevers, cough, chest pain, or shortness of breath.  He has not had any issues with his peritoneal dialysis recently and denies any abdominal pain.        Past Medical History:  Diagnosis Date  . Anxiety   . BPH (benign prostatic hyperplasia)   . Chronic kidney disease   . COPD (chronic obstructive pulmonary disease) ([place])   . Depression   . Diabetes mellitus without complication ([place])   . Hyperlipidemia   . Hypertension   . Hypothyroidism   . Nephrolithiasis   . Seizures [facility])     Patient Active Problem List   Diagnosis Date Noted  . Hypomagnesemia 03/13/2019  . Hyperphosphatemia 03/13/2019  . ESRD (end stage renal disease) ([place]) 03/13/2019  . Chronic kidney disease with peritoneal dialysis as preferred modality, stage 5 ([place]) 03/13/2019  . Prolonged QT interval 03/13/2019  . Essential hypertension  03/13/2019  . Hypothyroidism 03/13/2019  . Chronic diarrhea 03/13/2019  . Hypocalcemia 03/12/2019  . Hypokalemia 09/16/2018  . Lung nodule 06/12/2016  . Narrowing of airway   . Cigarette smoker 05/24/2016  . Collapse of right lung 05/24/2016  . COPD with chronic bronchitis ([place]) 05/24/2016  . Acne 05/20/2015  . Calculus of kidney 05/20/2015  . Chest pain, non-cardiac 05/20/2015  . Seizure ([place]) 05/20/2015  . Current tobacco use 05/20/2015  . Chronic kidney disease (CKD), stage III (moderate) 03/29/2015  . Type 2 diabetes mellitus ([place]) 03/15/2015  . Lipoma of shoulder 03/08/2014  . Other synovitis and tenosynovitis, right shoulder 03/08/2014  . Bursitis of elbow 02/15/2014  . Absolute anemia 08/08/2013  . 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 Back pain, chronic 08/08/2013  . Chronic obstructive pulmonary disease ([place]) 08/08/2013  . Diabetes mellitus ([place]) 08/08/2013  . BP (high blood pressure) 08/08/2013  . HLD (hyperlipidemia) 08/08/2013  . Acne erythematosa 08/08/2013    Past Surgical History:  Procedure Laterality Date  . COLONOSCOPY    . COLONOSCOPY WITH PROPOFOL N/A 05/30/2015   Procedure: COLONOSCOPY WITH PROPOFOL;  Surgeon: Manya Silvas, MD;  Location: [facility] ENDOSCOPY;  Service: Endoscopy;  Laterality: N/A;  . COLONOSCOPY WITH PROPOFOL N/A 12/01/2018   Procedure: COLONOSCOPY WITH PROPOFOL;  Surgeon: Toledo, [name], MD;  Location: [facility] ENDOSCOPY;  Service: Gastroenterology;  Laterality: N/A;  . kidney stone    . THYROIDECTOMY    .  VARICOCELE EXCISION    . VIDEO BRONCHOSCOPY Bilateral 05/25/2016   Procedure: VIDEO BRONCHOSCOPY WITHOUT FLUORO;  Surgeon: Juanito Doom, MD;  Location: [facility] ENDOSCOPY;  Service: Cardiopulmonary;  Laterality: Bilateral;    Prior to Admission medications   Medication Sig Start Date End Date Taking? Authorizing Provider  atorvastatin (LIPITOR) 10 MG tablet Take 10 mg by mouth daily. 04/08/15  Yes [provider]  furosemide  (LASIX) 80 MG tablet Take 80 mg by mouth daily.   Yes [provider]  gabapentin (NEURONTIN) 100 MG capsule TAKE 1 CAPSULE BY MOUTH TWICE A DAY FOR ONE WEEK, THEN INCREASE TO 2 CAPSULES TWICE A DAY AND CONTINUE AS DIRECTED 03/05/19  Yes [provider]  glipiZIDE (GLUCOTROL XL) 10 MG 24 hr tablet Take 10 mg by mouth daily. 03/29/15  Yes [provider]  HYDROcodone-acetaminophen (NORCO/VICODIN) 5-325 MG tablet Take 1 tablet by mouth every 4 (four) hours as needed for moderate pain.   Yes [provider]  insulin glargine (LANTUS) 100 unit/mL SOPN Inject 20 Units into the skin daily.   Yes [provider]  insulin lispro (HUMALOG) 100 UNIT/ML KwikPen Inject 4-14 Units into the skin daily. Sliding scale 08/27/16  Yes [provider]  levETIRAcetam (KEPPRA) 500 MG tablet Take 500 mg by mouth 2 (two) times a day. 09/05/15  Yes [provider]  levothyroxine (SYNTHROID) 200 MCG tablet Take 200 mcg by mouth every morning. 02/25/19  Yes [provider]  losartan (COZAAR) 100 MG tablet Take 100 mg by mouth daily. 09/12/18  Yes [provider]  metoprolol succinate (TOPROL-XL) 50 MG 24 hr tablet Take 1 tablet by mouth daily. 03/15/15 03/12/19 Yes [provider]  Pancrelipase, Lip-Prot-Amyl, 24000-76000 units CPEP Take 1 capsule by mouth 3 (three) times daily.   Yes [provider]  Semaglutide,0.25 or 0.5MG /DOS, 2 MG/1.5ML SOPN Inject 0.5 mg into the skin once a week. 06/26/17  Yes [provider]  traZODone (DESYREL) 50 MG tablet Take 100 mg by mouth at bedtime.   Yes [provider]  Amylase-Lipase-Protease (CREON 10 PO) Take by mouth daily as needed.    [provider]  calcium carbonate (TUMS) 500 MG chewable tablet Chew 1 tablet (200 mg of elemental calcium total) by mouth 3 (three) times daily with meals. Patient not taking: Reported on 03/12/2019 09/18/18 09/18/19  Fritzi Mandes, MD    escitalopram (LEXAPRO) 10 MG tablet Take 10 mg by mouth daily.    [provider]  potassium chloride SA (K-DUR) 20 MEQ tablet Take 1 tablet (20 mEq total) by mouth 2 (two) times a day. Patient not taking: Reported on 03/12/2019 09/18/18   Fritzi Mandes, MD    Allergies Patient has no known allergies.  Family History  Problem Relation Age of Onset  . Diabetes Mother   . Diabetes Maternal Grandmother   . Diabetes Maternal Grandfather   . Lung cancer Father   . Emphysema Paternal Grandfather     Social History Social History   Tobacco Use  . Smoking status: Light Tobacco Smoker    Packs/day: 1.00    Years: 40.00    Pack years: 40.00    Types: Cigarettes    Last attempt to quit: 05/25/2016    Years since quitting: 2.8  . Smokeless tobacco: Never Used  Substance Use Topics  . Alcohol use: Yes    Alcohol/week: 8.0 standard drinks    Types: 8 Cans of beer per week    Comment: beer or  wine   . Drug use: No    Review of Systems  Constitutional: No fever/chills.  Positive for dizziness and generalized weakness. Eyes: No visual changes. ENT: No sore throat.  Cardiovascular: Denies chest pain. Respiratory: Denies shortness of breath. Gastrointestinal: No abdominal pain.  No nausea, no vomiting.  No diarrhea.  No constipation. Genitourinary: Negative for dysuria. Musculoskeletal: Negative for back pain. Skin: Negative for rash. Neurological: Negative for headaches, focal weakness or numbness.  ____________________________________________   PHYSICAL EXAM:  VITAL SIGNS: ED Triage Vitals  Enc Vitals Group     BP --      Pulse --      Resp --      Temp --      Temp src --      SpO2 03/12/19 1922 100 %     Weight --      Height --      Head Circumference --      Peak Flow --      Pain Score 03/12/19 1924 0     Pain Loc --      Pain Edu? --      Excl. in [place]? --     Constitutional: Alert and oriented. Eyes: Conjunctivae are normal. Head:  Atraumatic. Nose: No congestion/rhinnorhea. Mouth/Throat: Mucous membranes are moist. Neck: Normal ROM Cardiovascular: Normal rate, regular rhythm. Grossly normal heart sounds. Respiratory: Normal respiratory effort.  No retractions. Lungs CTAB. Gastrointestinal: Soft and nontender. No distention.  PD catheter site clean, dry, and intact. Genitourinary: deferred Musculoskeletal: No lower extremity tenderness nor edema. Neurologic:  Normal speech and language. No gross focal neurologic deficits are appreciated. Skin:  Skin is warm, dry and intact. No rash noted. Psychiatric: Mood and affect are normal. Speech and behavior are normal.  ____________________________________________   LABS (all labs ordered are listed, but only abnormal results are displayed)  Labs Reviewed  BASIC METABOLIC PANEL - Abnormal; Notable for the following components:      Result Value   CO2 14 (*)    Glucose, Bld 136 (*)    BUN 67 (*)    Creatinine, Ser 10.31 (*)    Calcium 5.6 (*)    GFR calc non Af Amer 5 (*)    GFR calc Af Amer 5 (*)    Anion gap 18 (*)    All other components within normal limits  CBC WITH DIFFERENTIAL/PLATELET - Abnormal; Notable for the following components:   RBC 2.77 (*)    Hemoglobin 9.0 (*)    HCT 26.5 (*)    Abs Immature Granulocytes 0.08 (*)    All other components within normal limits  MAGNESIUM - Abnormal; Notable for the following components:   Magnesium 1.4 (*)    All other components within normal limits  PHOSPHORUS - Abnormal; Notable for the following components:   Phosphorus 9.1 (*)    All other components within normal limits  SARS CORONAVIRUS 2 (TAT 6-24 HRS)  CALCIUM, IONIZED  BASIC METABOLIC PANEL  CBC   ____________________________________________  EKG  ED ECG REPORT I, Blake Divine, the attending physician, personally viewed and interpreted this ECG.   Date: 03/12/2019  EKG Time: 19:26  Rate: 86  Rhythm: normal sinus rhythm  Axis: LAD   Intervals:right bundle branch block, left anterior fascicular block and Prolonged QT  ST&T Change: None   PROCEDURES  Procedure(s) performed (including Critical Care):  .Critical Care Performed by: Blake Divine, MD Authorized by: Blake Divine, MD   Critical care provider statement:  Critical care time (minutes):  45   Critical care time was exclusive of:  Separately billable procedures and treating other patients and teaching time   Critical care was necessary to treat or prevent imminent or life-threatening deterioration of the following conditions:  Metabolic crisis   Critical care was time spent personally by me on the following activities:  Discussions with consultants, evaluation of patient's response to treatment, examination of patient, ordering and performing treatments and interventions, ordering and review of laboratory studies, ordering and review of radiographic studies, pulse oximetry, re-evaluation of patient's condition, obtaining history from patient or surrogate and review of old charts   I assumed direction of critical care for this patient from another provider in my specialty: no       ____________________________________________   INITIAL IMPRESSION / ASSESSMENT AND PLAN / ED COURSE       69 year old male with history of ESRD on PD presents to the ED with 24 hours of dizziness and generalized weakness, was noted to have critically low calcium on outpatient labs at his GI doctors.  He does admit to ongoing chronic diarrhea and has dealt with low calcium before.  He does not have any confusion upon arrival to the ED and has no focal neurologic deficits.  EKG does show prolonged QT which could be related to his low calcium.  We will check BMP here as well as ionized calcium, magnesium, and phosphorus.  He will likely require admission for symptomatic hypocalcemia.  Lab work here in the ED again demonstrates hypocalcemia, also significant for hypomagnesemia and  hyperphosphatemia.  Will replete calcium and magnesium, he will likely need his elevated phosphorus addressed with his dialysis regimen.  Case discussed with hospitalist, who accepts patient for admission.      ____________________________________________   FINAL CLINICAL IMPRESSION(S) / ED DIAGNOSES  Final diagnoses:  Hypocalcemia  Hypomagnesemia  Generalized weakness  Prolonged Q-T interval on ECG     ED Discharge Orders    None       Note:  This document was prepared using Dragon voice recognition software and may include unintentional dictation errors.   Blake Divine, MD 03/13/19 9539    Blake Divine, MD 04/21/19 2153

## 2019-03-13 ENCOUNTER — Inpatient Hospital Stay: Payer: Medicare PPO

## 2019-03-13 DIAGNOSIS — E039 Hypothyroidism, unspecified: Secondary | ICD-10-CM | POA: Diagnosis present

## 2019-03-13 DIAGNOSIS — R9431 Abnormal electrocardiogram [ECG] [EKG]: Secondary | ICD-10-CM | POA: Diagnosis present

## 2019-03-13 DIAGNOSIS — N2581 Secondary hyperparathyroidism of renal origin: Secondary | ICD-10-CM | POA: Diagnosis present

## 2019-03-13 DIAGNOSIS — Z6841 Body Mass Index (BMI) 40.0 and over, adult: Secondary | ICD-10-CM | POA: Diagnosis not present

## 2019-03-13 DIAGNOSIS — I152 Hypertension secondary to endocrine disorders: Secondary | ICD-10-CM | POA: Diagnosis present

## 2019-03-13 DIAGNOSIS — N185 Chronic kidney disease, stage 5: Secondary | ICD-10-CM | POA: Diagnosis not present

## 2019-03-13 DIAGNOSIS — N186 End stage renal disease: Secondary | ICD-10-CM | POA: Diagnosis present

## 2019-03-13 DIAGNOSIS — Z833 Family history of diabetes mellitus: Secondary | ICD-10-CM | POA: Diagnosis not present

## 2019-03-13 DIAGNOSIS — Z79899 Other long term (current) drug therapy: Secondary | ICD-10-CM | POA: Diagnosis not present

## 2019-03-13 DIAGNOSIS — F17210 Nicotine dependence, cigarettes, uncomplicated: Secondary | ICD-10-CM | POA: Diagnosis present

## 2019-03-13 DIAGNOSIS — E1169 Type 2 diabetes mellitus with other specified complication: Secondary | ICD-10-CM | POA: Diagnosis not present

## 2019-03-13 DIAGNOSIS — Z825 Family history of asthma and other chronic lower respiratory diseases: Secondary | ICD-10-CM | POA: Diagnosis not present

## 2019-03-13 DIAGNOSIS — Z20822 Contact with and (suspected) exposure to covid-19: Secondary | ICD-10-CM | POA: Diagnosis present

## 2019-03-13 DIAGNOSIS — J449 Chronic obstructive pulmonary disease, unspecified: Secondary | ICD-10-CM | POA: Diagnosis present

## 2019-03-13 DIAGNOSIS — R5381 Other malaise: Secondary | ICD-10-CM | POA: Diagnosis not present

## 2019-03-13 DIAGNOSIS — R531 Weakness: Secondary | ICD-10-CM | POA: Diagnosis not present

## 2019-03-13 DIAGNOSIS — E1159 Type 2 diabetes mellitus with other circulatory complications: Secondary | ICD-10-CM | POA: Diagnosis present

## 2019-03-13 DIAGNOSIS — E1151 Type 2 diabetes mellitus with diabetic peripheral angiopathy without gangrene: Secondary | ICD-10-CM | POA: Diagnosis present

## 2019-03-13 DIAGNOSIS — I120 Hypertensive chronic kidney disease with stage 5 chronic kidney disease or end stage renal disease: Secondary | ICD-10-CM | POA: Diagnosis present

## 2019-03-13 DIAGNOSIS — Z801 Family history of malignant neoplasm of trachea, bronchus and lung: Secondary | ICD-10-CM | POA: Diagnosis not present

## 2019-03-13 DIAGNOSIS — I452 Bifascicular block: Secondary | ICD-10-CM | POA: Diagnosis present

## 2019-03-13 DIAGNOSIS — I10 Essential (primary) hypertension: Secondary | ICD-10-CM

## 2019-03-13 DIAGNOSIS — I639 Cerebral infarction, unspecified: Secondary | ICD-10-CM | POA: Diagnosis not present

## 2019-03-13 DIAGNOSIS — E876 Hypokalemia: Secondary | ICD-10-CM | POA: Diagnosis present

## 2019-03-13 DIAGNOSIS — E785 Hyperlipidemia, unspecified: Secondary | ICD-10-CM | POA: Diagnosis present

## 2019-03-13 DIAGNOSIS — E1122 Type 2 diabetes mellitus with diabetic chronic kidney disease: Secondary | ICD-10-CM | POA: Diagnosis present

## 2019-03-13 DIAGNOSIS — N400 Enlarged prostate without lower urinary tract symptoms: Secondary | ICD-10-CM | POA: Diagnosis present

## 2019-03-13 DIAGNOSIS — D631 Anemia in chronic kidney disease: Secondary | ICD-10-CM | POA: Diagnosis present

## 2019-03-13 DIAGNOSIS — E872 Acidosis: Secondary | ICD-10-CM | POA: Diagnosis present

## 2019-03-13 DIAGNOSIS — Z794 Long term (current) use of insulin: Secondary | ICD-10-CM | POA: Diagnosis not present

## 2019-03-13 DIAGNOSIS — K529 Noninfective gastroenteritis and colitis, unspecified: Secondary | ICD-10-CM | POA: Insufficient documentation

## 2019-03-13 LAB — CBC
HCT: 24 % — ABNORMAL LOW (ref 39.0–52.0)
Hemoglobin: 8.1 g/dL — ABNORMAL LOW (ref 13.0–17.0)
MCH: 32.4 pg (ref 26.0–34.0)
MCHC: 33.8 g/dL (ref 30.0–36.0)
MCV: 96 fL (ref 80.0–100.0)
Platelets: 183 10*3/uL (ref 150–400)
RBC: 2.5 MIL/uL — ABNORMAL LOW (ref 4.22–5.81)
RDW: 15.4 % (ref 11.5–15.5)
WBC: 7.2 10*3/uL (ref 4.0–10.5)
nRBC: 0 % (ref 0.0–0.2)

## 2019-03-13 LAB — GLUCOSE, CAPILLARY
Glucose-Capillary: 121 mg/dL — ABNORMAL HIGH (ref 70–99)
Glucose-Capillary: 146 mg/dL — ABNORMAL HIGH (ref 70–99)
Glucose-Capillary: 215 mg/dL — ABNORMAL HIGH (ref 70–99)
Glucose-Capillary: 71 mg/dL (ref 70–99)
Glucose-Capillary: 166 mg/dL — ABNORMAL HIGH (ref 70–99)

## 2019-03-13 LAB — BASIC METABOLIC PANEL
Anion gap: 12 (ref 5–15)
BUN: 66 mg/dL — ABNORMAL HIGH (ref 8–23)
CO2: 17 mmol/L — ABNORMAL LOW (ref 22–32)
Calcium: 5.6 mg/dL — CL (ref 8.9–10.3)
Chloride: 106 mmol/L (ref 98–111)
Creatinine, Ser: 10.37 mg/dL — ABNORMAL HIGH (ref 0.61–1.24)
GFR calc Af Amer: 5 mL/min — ABNORMAL LOW (ref >60)
GFR calc non Af Amer: 4 mL/min — ABNORMAL LOW (ref >60)
Glucose, Bld: 107 mg/dL — ABNORMAL HIGH (ref 70–99)
Potassium: 3.9 mmol/L (ref 3.5–5.1)
Sodium: 135 mmol/L (ref 135–145)

## 2019-03-13 LAB — HEPATIC FUNCTION PANEL
ALT: 9 U/L (ref 0–44)
AST: 14 U/L — ABNORMAL LOW (ref 15–41)
Albumin: 2.1 g/dL — ABNORMAL LOW (ref 3.5–5.0)
Alkaline Phosphatase: 86 U/L (ref 38–126)
Bilirubin, Direct: <0.1 mg/dL (ref 0.0–0.2)
Total Bilirubin: 0.7 mg/dL (ref 0.3–1.2)
Total Protein: 5.1 g/dL — ABNORMAL LOW (ref 6.5–8.1)

## 2019-03-13 LAB — SARS CORONAVIRUS 2 (TAT 6-24 HRS): SARS Coronavirus 2: NEGATIVE

## 2019-03-13 MED ORDER — LOSARTAN POTASSIUM 50 MG PO TABS
100.0000 mg | ORAL_TABLET | Freq: Every day | ORAL | Status: DC
  Administered 2019-03-13 – 2019-03-20 (×8): 100 mg via ORAL
  Filled 2019-03-13 (×8): qty 2

## 2019-03-13 MED ORDER — TRAZODONE HCL 100 MG PO TABS: 100.0000 mg | ORAL_TABLET | Freq: Every day | ORAL | Status: DC

## 2019-03-13 MED ORDER — SODIUM BICARBONATE 650 MG PO TABS
650.0000 mg | ORAL_TABLET | Freq: Two times a day (BID) | ORAL | Status: DC
  Administered 2019-03-13 – 2019-03-20 (×15): 650 mg via ORAL
  Filled 2019-03-13 (×16): qty 1

## 2019-03-13 MED ORDER — METOPROLOL SUCCINATE ER 50 MG PO TB24
50.0000 mg | ORAL_TABLET | Freq: Every day | ORAL | Status: DC
  Administered 2019-03-13 – 2019-03-20 (×8): 50 mg via ORAL
  Filled 2019-03-13 (×8): qty 1

## 2019-03-13 MED ORDER — CALCIUM GLUCONATE-NACL 2-0.675 GM/100ML-% IV SOLN
2.0000 g | Freq: Once | INTRAVENOUS | Status: AC
  Administered 2019-03-13: 2000 mg via INTRAVENOUS
  Filled 2019-03-13: qty 100

## 2019-03-13 MED ORDER — LEVOTHYROXINE SODIUM 100 MCG PO TABS
200.0000 ug | ORAL_TABLET | Freq: Every morning | ORAL | Status: DC
  Administered 2019-03-13 – 2019-03-20 (×6): 200 ug via ORAL
  Filled 2019-03-13 (×3): qty 2
  Filled 2019-03-13: qty 4
  Filled 2019-03-13 (×3): qty 2

## 2019-03-13 MED ORDER — ATORVASTATIN CALCIUM 10 MG PO TABS
10.0000 mg | ORAL_TABLET | Freq: Every day | ORAL | Status: DC
  Administered 2019-03-13 – 2019-03-15 (×4): 10 mg via ORAL
  Filled 2019-03-13 (×4): qty 1

## 2019-03-13 MED ORDER — FUROSEMIDE 40 MG PO TABS: 80.0000 mg | ORAL_TABLET | Freq: Every day | ORAL | Status: DC

## 2019-03-13 MED ORDER — INSULIN ASPART 100 UNIT/ML ~~LOC~~ SOLN
0.0000 [IU] | Freq: Three times a day (TID) | SUBCUTANEOUS | Status: DC
  Administered 2019-03-13: 5 [IU] via SUBCUTANEOUS
  Administered 2019-03-13: 2 [IU] via SUBCUTANEOUS
  Administered 2019-03-14: 5 [IU] via SUBCUTANEOUS
  Administered 2019-03-14: 8 [IU] via SUBCUTANEOUS
  Administered 2019-03-15: 5 [IU] via SUBCUTANEOUS
  Administered 2019-03-15: 3 [IU] via SUBCUTANEOUS
  Administered 2019-03-16 – 2019-03-17 (×3): 5 [IU] via SUBCUTANEOUS
  Administered 2019-03-18: 2 [IU] via SUBCUTANEOUS
  Administered 2019-03-18: 5 [IU] via SUBCUTANEOUS
  Administered 2019-03-18: 09:00:00 2 [IU] via SUBCUTANEOUS
  Administered 2019-03-19: 8 [IU] via SUBCUTANEOUS
  Administered 2019-03-19 (×2): 2 [IU] via SUBCUTANEOUS
  Administered 2019-03-20 (×2): 3 [IU] via SUBCUTANEOUS
  Filled 2019-03-13 (×19): qty 1

## 2019-03-13 MED ORDER — DELFLEX-LC/1.5% DEXTROSE 344 MOSM/L IP SOLN
INTRAPERITONEAL | Status: DC
  Administered 2019-03-13: 10 L via INTRAPERITONEAL
  Administered 2019-03-14 – 2019-03-17 (×2): 12.5 L via INTRAPERITONEAL
  Filled 2019-03-13 (×8): qty 3000

## 2019-03-13 MED ORDER — GABAPENTIN 100 MG PO CAPS
200.0000 mg | ORAL_CAPSULE | Freq: Two times a day (BID) | ORAL | Status: DC
  Administered 2019-03-13 – 2019-03-17 (×11): 200 mg via ORAL
  Filled 2019-03-13 (×11): qty 2

## 2019-03-13 MED ORDER — AMLODIPINE BESYLATE 10 MG PO TABS
10.0000 mg | ORAL_TABLET | Freq: Every day | ORAL | Status: DC
  Administered 2019-03-13 – 2019-03-17 (×5): 10 mg via ORAL
  Filled 2019-03-13: qty 1
  Filled 2019-03-13: qty 2
  Filled 2019-03-13 (×3): qty 1

## 2019-03-13 MED ORDER — LEVETIRACETAM 500 MG PO TABS
500.0000 mg | ORAL_TABLET | Freq: Two times a day (BID) | ORAL | Status: DC
  Administered 2019-03-13 – 2019-03-20 (×16): 500 mg via ORAL
  Filled 2019-03-13 (×16): qty 1

## 2019-03-13 MED ORDER — PANCRELIPASE (LIP-PROT-AMYL) 12000-38000 UNITS PO CPEP
24000.0000 [IU] | ORAL_CAPSULE | Freq: Three times a day (TID) | ORAL | Status: DC
  Administered 2019-03-13 – 2019-03-20 (×21): 24000 [IU] via ORAL
  Filled 2019-03-13 (×25): qty 2

## 2019-03-13 MED ORDER — GENTAMICIN SULFATE 0.1 % EX CREA
1.0000 "application " | TOPICAL_CREAM | Freq: Every day | CUTANEOUS | Status: DC
  Administered 2019-03-13 – 2019-03-20 (×9): 1 via TOPICAL
  Filled 2019-03-13: qty 15

## 2019-03-13 NOTE — Progress Notes (Addendum)
PROGRESS NOTE  Kyle [name] BHA:[id number] DOB: 1950-09-17 DOA: 03/12/2019 PCP: Idelle Crouch, MD   LOS: 0 days   Brief Narrative / Interim history: 69 year old male with history of end-stage renal disease on PD, COPD, HTN, seizure disorder, presents to the hospital with concern for abnormal labs.  He has been following with gastroenterology for persistent diarrhea and bowel incontinence, had routine blood work and was noted to have significant hypocalcemia and was advised to come to the emergency room.  Calcium on presentation was 5.6.  He has a history of hypercalcemia in the past and takes Tums 3 times daily with meals at home.  He has been progressively weak, intermittently confused at home and missed dialysis in the last 4 days.  Per patient, sometimes he "does not feel like"  Subjective / 24h Interval events: No complaints this morning, feels sleepy.  No chest pain, no abdominal pain, no nausea or vomiting  Assessment & Plan: Principal Problem Hypercalcemia -Potassium 5.6 on admission, repleted aggressively with intravenous calcium gluconate, repeat ionized calcium pending.  PTH pending. -Currently placed on calcium acetate 3 times daily, monitor calcium levels.  Complicated by hyperphosphatemia  Active Problems ESRD on peritoneal dialysis, metabolic acidosis -Nephrology consulted, he will get dialyzed later today -Complicated by noncompliance at home  Anemia in the setting of chronic kidney disease -Hemoglobin stable, no evidence of bleeding, monitor  Hx of seizures  -Continue Keppra  COPD -not in exacerbation, stable, no wheezing  HTN   -Continue amlodipine, losartan, metoprolol  Depression -hold Lexapro and trazodone for now due to borderline QT prolongation  Type 2 diabetes with neuropathy -normally on 20 units lantus daily, SSI -Keep on sliding scale, monitor CBGs  CBG (last 3)  Recent Labs    03/13/19 0950  GLUCAP 121*   Hypothyroidism  -continue levothyroxine   Chronic diarrhea -continue creon, follows with GI outpatient  HLD -continue statin   Addendum Patient had a fall in the ED, slid off bed, hit his head against a soft chair, injured his right elbow. Will obtain CT head.    Scheduled Meds: . amLODipine  10 mg Oral Daily  . atorvastatin  10 mg Oral Daily  . calcium acetate  1,334 mg Oral TID WC  . gabapentin  200 mg Oral BID  . heparin  5,000 Units Subcutaneous Q8H  . insulin aspart  0-15 Units Subcutaneous TID WC  . levETIRAcetam  500 mg Oral BID  . levothyroxine  200 mcg Oral q morning - 10a  . lipase/protease/amylase  24,000 Units Oral TID  . losartan  100 mg Oral Daily  . metoprolol succinate  50 mg Oral Daily  . sodium bicarbonate  650 mg Oral BID   Continuous Infusions: PRN Meds:.  DVT prophylaxis: heparin Code Status: Full code Family Communication: no family at bedside  Patient admitted from: home  Anticipated d/c place: home Barriers to d/c: hypocalcemia, need for hD  Consultants:  Nephrology   Procedures:  None   Microbiology  SARS-CoV-2 1/14-negative  Antimicrobials: None     Objective: Vitals:   03/13/19 0440 03/13/19 0655 03/13/19 1038 03/13/19 1112  BP: 139/88 (!) 148/89 (!) 164/76 114/86  Pulse:  92 96 (!) 107  Resp:  20 18 17   Temp:   (!) 97.2 F (36.2 C) 98.6 F (37 C)  TempSrc:   Oral Oral  SpO2:  99% 100% 100%  Weight:      Height:        Intake/Output Summary (Last 24  hours) at 03/13/2019 1207 Last data filed at 03/12/2019 2224 Gross per 24 hour  Intake 100 ml  Output -  Net 100 ml   Filed Weights   03/12/19 1924  Weight: 93.9 kg    Examination:  Constitutional: NAD Eyes: no scleral icterus ENMT: Mucous membranes are moist.  Neck: normal, supple Respiratory: clear to auscultation bilaterally, no wheezing, no crackles. Normal respiratory effort.  Cardiovascular: Regular rate and rhythm, no murmurs / rubs / gallops. Trace LE edema.  Abdomen:  non distended, no tenderness. Bowel sounds positive.  Musculoskeletal: no clubbing / cyanosis.  Skin: no rashes Neurologic: CN 2-12 grossly intact. Strength 5/5 in all 4.  Psychiatric: Normal judgment and insight. Alert and oriented x 3. Normal mood.    Data Reviewed: I have independently reviewed following labs and imaging studies   CBC: Recent Labs  Lab 03/12/19 1947 03/13/19 0436  WBC 8.1 7.2  NEUTROABS 6.0  --   HGB 9.0* 8.1*  HCT 26.5* 24.0*  MCV 95.7 96.0  PLT 209 275   Basic Metabolic Panel: Recent Labs  Lab 03/12/19 1947 03/13/19 0436  NA 135 135  K 3.8 3.9  CL 103 106  CO2 14* 17*  GLUCOSE 136* 107*  BUN 67* 66*  CREATININE 10.31* 10.37*  CALCIUM 5.6* 5.6*  MG 1.4*  --   PHOS 9.1*  --    Liver Function Tests: Recent Labs  Lab 03/13/19 0436  AST 14*  ALT 9  ALKPHOS 86  BILITOT 0.7  PROT 5.1*  ALBUMIN 2.1*   Coagulation Profile: No results for input(s): INR, PROTIME in the last 168 hours. HbA1C: No results for input(s): HGBA1C in the last 72 hours. CBG: Recent Labs  Lab 03/13/19 0950  GLUCAP 121*    Recent Results (from the past 240 hour(s))  SARS CORONAVIRUS 2 (TAT 6-24 HRS) Nasopharyngeal Nasopharyngeal Swab     Status: None   Collection Time: 03/12/19  9:31 PM   Specimen: Nasopharyngeal Swab  Result Value Ref Range Status   SARS Coronavirus 2 NEGATIVE NEGATIVE Final    Comment: (NOTE) SARS-CoV-2 target nucleic acids are NOT DETECTED. The SARS-CoV-2 RNA is generally detectable in upper and lower respiratory specimens during the acute phase of infection. Negative results do not preclude SARS-CoV-2 infection, do not rule out co-infections with other pathogens, and should not be used as the sole basis for treatment or other patient management decisions. Negative results must be combined with clinical observations, patient history, and epidemiological information. The expected result is Negative. Fact Sheet for Patients:  SugarRoll.be Fact Sheet for Healthcare Providers: https://www.woods-mathews.com/ This test is not yet approved or cleared by the [place] FDA and  has been authorized for detection and/or diagnosis of SARS-CoV-2 by FDA under an Emergency Use Authorization (EUA). This EUA will remain  in effect (meaning this test can be used) for the duration of the COVID-19 declaration under Section 56 4(b)(1) of the Act, 21 U.S.C. section 360bbb-3(b)(1), unless the authorization is terminated or revoked sooner. Performed at [facility], [place] [address], [place], [place] [address]      Radiology Studies: No results found.  Marzetta Board, MD, PhD [facility]  Between 7 am - 7 pm I am available, please contact me via Amion or Securechat  Between 7 pm - 7 am I am not available, please contact night coverage MD/APP via Amion

## 2019-03-13 NOTE — Progress Notes (Signed)
Pt blood sugar at 71 at 2032. Pt requested 2 cups of Jello and given.

## 2019-03-13 NOTE — ED Notes (Signed)
Pt's wife informed of fall

## 2019-03-13 NOTE — Progress Notes (Signed)
Pd started 

## 2019-03-13 NOTE — Progress Notes (Addendum)
Pt admitted to 2A, oriented to room, expressed no needs at this time

## 2019-03-13 NOTE — Progress Notes (Signed)
Ok to give information to wife

## 2019-03-13 NOTE — Progress Notes (Signed)
[facility]  ROUNDING NOTE   Subjective:   [name] admitted to [facility] on 03/12/2019 for Hypocalcemia [E83.51]  Did not get dialysis last night.   Objective:  Vital signs in last 24 hours:  Temp:  [98 F (36.7 C)] 98 F (36.7 C) (01/14 1925) Pulse Rate:  [38-96] 92 (01/15 0655) Resp:  [13-25] 20 (01/15 0655) BP: (133-184)/(76-106) 148/89 (01/15 0655) SpO2:  [95 %-100 %] 99 % (01/15 0655) Weight:  [93.9 kg] 93.9 kg (01/14 1924)  Weight change:  Filed Weights   03/12/19 1924  Weight: 93.9 kg    Intake/Output: I/O last 3 completed shifts: In: 100 [IV Piggyback:100] Out: -    Intake/Output this shift:  No intake/output data recorded.  Physical Exam: General: NAD,   Head: Normocephalic, atraumatic. Moist oral mucosal membranes  Eyes: Anicteric, PERRL  Neck: Supple, trachea midline  Lungs:  Clear to auscultation  Heart: Regular rate and rhythm  Abdomen:  Soft, nontender,   Extremities:  no peripheral edema.  Neurologic: Nonfocal, moving all four extremities  Skin: No lesions  Access: Peritoneal catheter    Basic Metabolic Panel: Recent Labs  Lab 03/12/19 1947 03/13/19 0436  NA 135 135  K 3.8 3.9  CL 103 106  CO2 14* 17*  GLUCOSE 136* 107*  BUN 67* 66*  CREATININE 10.31* 10.37*  CALCIUM 5.6* 5.6*  MG 1.4*  --   PHOS 9.1*  --     Liver Function Tests: No results for input(s): AST, ALT, ALKPHOS, BILITOT, PROT, ALBUMIN in the last 168 hours. No results for input(s): LIPASE, AMYLASE in the last 168 hours. No results for input(s): AMMONIA in the last 168 hours.  CBC: Recent Labs  Lab 03/12/19 1947 03/13/19 0436  WBC 8.1 7.2  NEUTROABS 6.0  --   HGB 9.0* 8.1*  HCT 26.5* 24.0*  MCV 95.7 96.0  PLT 209 183    Cardiac Enzymes: No results for input(s): CKTOTAL, CKMB, CKMBINDEX, TROPONINI in the last 168 hours.  BNP: Invalid input(s): POCBNP  CBG: No results for input(s): GLUCAP in the last 168  hours.  Microbiology: Results for orders placed or performed during the hospital encounter of 03/12/19  SARS CORONAVIRUS 2 (TAT 6-24 HRS) Nasopharyngeal Nasopharyngeal Swab     Status: None   Collection Time: 03/12/19  9:31 PM   Specimen: Nasopharyngeal Swab  Result Value Ref Range Status   SARS Coronavirus 2 NEGATIVE NEGATIVE Final    Comment: (NOTE) SARS-CoV-2 target nucleic acids are NOT DETECTED. The SARS-CoV-2 RNA is generally detectable in upper and lower respiratory specimens during the acute phase of infection. Negative results do not preclude SARS-CoV-2 infection, do not rule out co-infections with other pathogens, and should not be used as the sole basis for treatment or other patient management decisions. Negative results must be combined with clinical observations, patient history, and epidemiological information. The expected result is Negative. Fact Sheet for Patients: SugarRoll.be Fact Sheet for Healthcare Providers: https://www.woods-mathews.com/ This test is not yet approved or cleared by the [place] FDA and  has been authorized for detection and/or diagnosis of SARS-CoV-2 by FDA under an Emergency Use Authorization (EUA). This EUA will remain  in effect (meaning this test can be used) for the duration of the COVID-19 declaration under Section 56 4(b)(1) of the Act, 21 U.S.C. section 360bbb-3(b)(1), unless the authorization is terminated or revoked sooner. Performed at [facility], [place] [address], [place], [place] [address]     Coagulation Studies: No  results for input(s): LABPROT, INR in the last 72 hours.  Urinalysis: No results for input(s): COLORURINE, LABSPEC, PHURINE, GLUCOSEU, HGBUR, BILIRUBINUR, KETONESUR, PROTEINUR, UROBILINOGEN, NITRITE, LEUKOCYTESUR in the last 72 hours.  Invalid input(s): APPERANCEUR    Imaging: No results found.   Medications:   . calcium gluconate     . atorvastatin   10 mg Oral Daily  . calcium acetate  1,334 mg Oral TID WC  . gabapentin  200 mg Oral BID  . heparin  5,000 Units Subcutaneous Q8H  . insulin aspart  0-15 Units Subcutaneous TID WC  . levETIRAcetam  500 mg Oral BID  . levothyroxine  200 mcg Oral q morning - 10a  . lipase/protease/amylase  24,000 Units Oral TID  . losartan  100 mg Oral Daily  . metoprolol succinate  50 mg Oral Daily     Assessment/ Plan:  [name] is a 69 y.o. white male with end stage renal disease on peritoneal dialysis, hypertension, diabetes mellitus type II, seizure disorder, nephrolithiasis, hypothyroidism, hyperlipidemia, depression, BPH, COPD  [facility] 94kg CCPD 9 hours 5 exchanges 2557mL fills  Dry during daytime  1. End Stage Renal Disease: with metabolic acidosis: acidosis can be explained by inadequate dialysis Missed dialysis last night.  Will schedule dialysis for today. Orders prepared - Start sodium bicarbonate PO  2. Hypertension: 148/89. Home regimen of amlodipine, metoprolol and losartan.   3. Secondary Hyperparathyroidism: PTH low at 120. With hypocalcemia and hyperphosphatemia - IV calcium gluconate - monitor potassium as patient has history of hypokalemia - start calcium acetate with meals.  - Check albumin to calculate corrected calcium level.  - Check PTH level  4. Anemia of chronic kidney disease: hemoglobin 8.1. Gets EPO as outpatient.    LOS: 0 Kyle [name] 1/15/20218:15 AM

## 2019-03-13 NOTE — Progress Notes (Signed)
Nurse reports critical calcium of 5.6, unchanged despite replacement given earlier. Ionized calcium ordered along with repeat mag and phosphorous level.

## 2019-03-13 NOTE — ED Notes (Signed)
This RN heard a loud thump from patient's room, upon entering the room pt was found to be in the floor laying on his right side. Both side rails up on stretcher. Pt states he was trying to get to the bathroom, call bell in reach, pt reports "that thing doesn't work sometimes". Pt reports he hit his head on the padded bench beside his bed during the fall. Pt assisted to his feet by this RN and Laurence Aly and assisted to toilet where pt had a bowel movement. Dr. Renne Crigler informed of pt fall and at bedside at 1605. Pt A&Ox4 and in NAD. Pt has a skin tear to underside of right forearm that was dressed by Luellen Pucker RN with gauze and tape, bleeding controlled. Pt has a small skin tear to right shin with bleeding controlled.

## 2019-03-13 NOTE — ED Notes (Signed)
Date and time results received: 03/13/19 5:21 AM   Test: Calcium Critical Value: 5.6  Name of Provider Notified: Sharion Settler

## 2019-03-13 NOTE — Progress Notes (Signed)
Attempted to call report to 2A, RN not available.

## 2019-03-14 DIAGNOSIS — R5381 Other malaise: Secondary | ICD-10-CM

## 2019-03-14 LAB — COMPREHENSIVE METABOLIC PANEL
ALT: 10 U/L (ref 0–44)
AST: 14 U/L — ABNORMAL LOW (ref 15–41)
Albumin: 2.1 g/dL — ABNORMAL LOW (ref 3.5–5.0)
Alkaline Phosphatase: 92 U/L (ref 38–126)
Anion gap: 16 — ABNORMAL HIGH (ref 5–15)
BUN: 59 mg/dL — ABNORMAL HIGH (ref 8–23)
CO2: 19 mmol/L — ABNORMAL LOW (ref 22–32)
Calcium: 6.5 mg/dL — ABNORMAL LOW (ref 8.9–10.3)
Chloride: 102 mmol/L (ref 98–111)
Creatinine, Ser: 9.39 mg/dL — ABNORMAL HIGH (ref 0.61–1.24)
GFR calc Af Amer: 6 mL/min — ABNORMAL LOW (ref >60)
GFR calc non Af Amer: 5 mL/min — ABNORMAL LOW (ref >60)
Glucose, Bld: 233 mg/dL — ABNORMAL HIGH (ref 70–99)
Potassium: 3.6 mmol/L (ref 3.5–5.1)
Sodium: 137 mmol/L (ref 135–145)
Total Bilirubin: 0.6 mg/dL (ref 0.3–1.2)
Total Protein: 5.2 g/dL — ABNORMAL LOW (ref 6.5–8.1)

## 2019-03-14 LAB — CBC
HCT: 24.9 % — ABNORMAL LOW (ref 39.0–52.0)
Hemoglobin: 8.3 g/dL — ABNORMAL LOW (ref 13.0–17.0)
MCH: 32.2 pg (ref 26.0–34.0)
MCHC: 33.3 g/dL (ref 30.0–36.0)
MCV: 96.5 fL (ref 80.0–100.0)
Platelets: 178 10*3/uL (ref 150–400)
RBC: 2.58 MIL/uL — ABNORMAL LOW (ref 4.22–5.81)
RDW: 15.1 % (ref 11.5–15.5)
WBC: 5.6 10*3/uL (ref 4.0–10.5)
nRBC: 0 % (ref 0.0–0.2)

## 2019-03-14 LAB — GLUCOSE, CAPILLARY
Glucose-Capillary: 155 mg/dL — ABNORMAL HIGH (ref 70–99)
Glucose-Capillary: 210 mg/dL — ABNORMAL HIGH (ref 70–99)
Glucose-Capillary: 268 mg/dL — ABNORMAL HIGH (ref 70–99)
Glucose-Capillary: 82 mg/dL (ref 70–99)
Glucose-Capillary: 89 mg/dL (ref 70–99)

## 2019-03-14 LAB — CALCIUM, IONIZED
Calcium, Ionized, Serum: <3 mg/dL — ABNORMAL LOW (ref 4.5–5.6)
Calcium, Ionized, Serum: <3 mg/dL — ABNORMAL LOW (ref 4.5–5.6)

## 2019-03-14 LAB — PARATHYROID HORMONE, INTACT (NO CA): PTH: 91 pg/mL — ABNORMAL HIGH (ref 15–65)

## 2019-03-14 LAB — MAGNESIUM: Magnesium: 1.8 mg/dL (ref 1.7–2.4)

## 2019-03-14 LAB — PHOSPHORUS: Phosphorus: 9 mg/dL — ABNORMAL HIGH (ref 2.5–4.6)

## 2019-03-14 MED ORDER — CALCIUM GLUCONATE-NACL 2-0.675 GM/100ML-% IV SOLN
2.0000 g | Freq: Once | INTRAVENOUS | Status: DC
  Filled 2019-03-14: qty 100

## 2019-03-14 MED ORDER — CALCIUM CARBONATE 1250 (500 CA) MG PO TABS
1250.0000 mg | ORAL_TABLET | Freq: Three times a day (TID) | ORAL | Status: DC
  Administered 2019-03-15 – 2019-03-20 (×16): 1250 mg via ORAL
  Filled 2019-03-14 (×18): qty 1

## 2019-03-14 MED ORDER — CALCIUM CARBONATE 1250 (500 CA) MG PO TABS: 1250.0000 mg | ORAL_TABLET | Freq: Three times a day (TID) | ORAL | Status: DC

## 2019-03-14 MED ORDER — EPOETIN ALFA 10000 UNIT/ML IJ SOLN
10000.0000 [IU] | INTRAMUSCULAR | Status: DC
  Administered 2019-03-15: 10000 [IU] via SUBCUTANEOUS
  Filled 2019-03-14 (×3): qty 1

## 2019-03-14 NOTE — Progress Notes (Signed)
Pd started 

## 2019-03-14 NOTE — Progress Notes (Signed)
PROGRESS NOTE  Kyle [name] SVX:[id number] DOB: [birth year] DOA: 03/12/2019 PCP: Idelle Crouch, MD  HPI/Recap of past 24 hours: 69 year old male with past medical history of end-stage renal disease on peritoneal dialysis, COPD, hypertension and seizure disorder presented to the hospital on 1/14 with concern for abnormal labs.  He has been following with GI for issues with persistent diarrhea and bowel incontinence and on routine blood work was noted to have significant hypocalcemia with a calcium on admission that was less than 5.5.  Patient has had this in the past and takes Tums 3 times a day at home.  According to his wife, the patient does not sleep well so at times he will skip his home peritoneal dialysis.  There have been reports he is been having progressive weakness.  His wife is able to clarify that outside of the physical therapy at home health PT does, he does not move or do any exercises beyond that.  Following admission, patient confused fell out of bed hitting his head.  He was evaluated and no signs of any head bleed or stroke.  following hospitalization, patient received peritoneal dialysis and electrolytes have improved although calcium is not yet normalized.  Today, patient is still quite weak although he appears to be much more alert.  He does not listen when nursing tells him not to get up on his own, and attempts to do so, but is still so weak that he immediately falls back to bed.  He himself denies any other complaints.  Assessment/Plan: Principal Problem:   Hypocalcemia/hypophosphatemia/hypomagnesemia and patient with end-stage renal disease on peritoneal dialysis: Nephrology consulted.  Complicated by noncompliance.  Has received peritoneal dialysis here.  According to nephrology, he is closer to baseline.  Calcium notes improvement. Active Problems:   Chronic obstructive pulmonary disease ([place]): Stable.    HLD (hyperlipidemia)   Seizure ([place]): Stable.  Continue  home medications.    Type 2 diabetes mellitus ([place]): CBGs overall stable.    Prolonged QT interval   Essential hypertension: Blood pressures slightly on the higher end.  Managed by peritoneal dialysis and home medications.    Hypothyroidism: Continue Synthroid.    Physical deconditioning: No vexing issue.  The patient himself is not very motivated and has become quite deconditioned even at home.  He is currently barely able to stand.  This is in part due to electrolyte abnormalities but also due to his refusal to participate in the exercises recommended by physical therapy.  Unfortunately, I am not sure if any skilled nursing facilities will take the patient on peritoneal dialysis.  overweight: Meets criteria with BMI greater than 25.  Code Status: Full code   Family Communication: Updated wife by phone   Disposition Plan: Awaiting evaluation by PT/OT.  Given his significant deconditioning, he likely would benefit from SNF.  This will be difficult since most SNFs do not do PD.  Will check and see if any facilities offer that.  If not, he will need to go home with home health PT.  Wife has concerns, because she states that outside of the home health PT, he does not do any other physical therapy and has remained a week at home.   Consultants:  Nephrology  Procedures:  Peritoneal dialysis  Antimicrobials:  None  DVT prophylaxis: Heparin   Objective: Vitals:   03/14/19 0757 03/14/19 1601  BP: 136/60 (!) 143/70  Pulse: 61 71  Resp: 19 15  Temp: 97.8 F (36.6 C) 97.9 F (36.6 C)  SpO2: 100% 100%    Intake/Output Summary (Last 24 hours) at 03/14/2019 1616 Last data filed at 03/14/2019 1030 Gross per 24 hour  Intake --  Output 200 ml  Net -200 ml   Filed Weights   03/12/19 1924  Weight: 93.9 kg   Body mass index is 28.07 kg/m.  Exam:   General: Alert and oriented x2 or 3, no acute distress  Cardiovascular: Regular rate and rhythm, S1-S2  Respiratory: Clear  to auscultation bilaterally  Abdomen: Soft, nontender, nondistended, positive bowel sounds  Musculoskeletal: No clubbing or cyanosis or edema  Skin: No skin breaks, tears or lesions  Psychiatry: Appropriate, no evidence of psychoses   Data Reviewed: CBC: Recent Labs  Lab 03/12/19 1947 03/13/19 0436 03/14/19 0623  WBC 8.1 7.2 5.6  NEUTROABS 6.0  --   --   HGB 9.0* 8.1* 8.3*  HCT 26.5* 24.0* 24.9*  MCV 95.7 96.0 96.5  PLT 209 183 423   Basic Metabolic Panel: Recent Labs  Lab 03/12/19 1947 03/13/19 0436 03/14/19 0623  NA 135 135 137  K 3.8 3.9 3.6  CL 103 106 102  CO2 14* 17* 19*  GLUCOSE 136* 107* 233*  BUN 67* 66* 59*  CREATININE 10.31* 10.37* 9.39*  CALCIUM 5.6* 5.6* 6.5*  MG 1.4*  --  1.8  PHOS 9.1*  --  9.0*   GFR: Estimated Creatinine Clearance: 8.7 mL/min (A) (by C-G formula based on SCr of 9.39 mg/dL (H)). Liver Function Tests: Recent Labs  Lab 03/13/19 0436 03/14/19 0623  AST 14* 14*  ALT 9 10  ALKPHOS 86 92  BILITOT 0.7 0.6  PROT 5.1* 5.2*  ALBUMIN 2.1* 2.1*   No results for input(s): LIPASE, AMYLASE in the last 168 hours. No results for input(s): AMMONIA in the last 168 hours. Coagulation Profile: No results for input(s): INR, PROTIME in the last 168 hours. Cardiac Enzymes: No results for input(s): CKTOTAL, CKMB, CKMBINDEX, TROPONINI in the last 168 hours. BNP (last 3 results) No results for input(s): PROBNP in the last 8760 hours. HbA1C: No results for input(s): HGBA1C in the last 72 hours. CBG: Recent Labs  Lab 03/13/19 1728 03/13/19 2032 03/13/19 2310 03/14/19 0755 03/14/19 1136  GLUCAP 215* 71 166* 268* 82   Lipid Profile: No results for input(s): CHOL, HDL, LDLCALC, TRIG, CHOLHDL, LDLDIRECT in the last 72 hours. Thyroid Function Tests: No results for input(s): TSH, T4TOTAL, FREET4, T3FREE, THYROIDAB in the last 72 hours. Anemia Panel: No results for input(s): VITAMINB12, FOLATE, FERRITIN, TIBC, IRON, RETICCTPCT in the last  72 hours. Urine analysis:    Component Value Date/Time   COLORURINE YELLOW (A) 07/27/2016 1648   APPEARANCEUR CLEAR (A) 07/27/2016 1648   APPEARANCEUR Clear 08/08/2013 1947   LABSPEC 1.021 07/27/2016 1648   LABSPEC 1.012 08/08/2013 1947   PHURINE 5.0 07/27/2016 1648   GLUCOSEU >=500 (A) 07/27/2016 1648   GLUCOSEU 50 mg/dL 08/08/2013 1947   HGBUR SMALL (A) 07/27/2016 1648   BILIRUBINUR NEGATIVE 07/27/2016 1648   BILIRUBINUR Negative 08/08/2013 1947   KETONESUR NEGATIVE 07/27/2016 1648   PROTEINUR >=300 (A) 07/27/2016 1648   NITRITE NEGATIVE 07/27/2016 1648   LEUKOCYTESUR NEGATIVE 07/27/2016 1648   LEUKOCYTESUR Negative 08/08/2013 1947   Sepsis Labs: @LABRCNTIP (procalcitonin:4,lacticidven:4)  ) Recent Results (from the past 240 hour(s))  SARS CORONAVIRUS 2 (TAT 6-24 HRS) Nasopharyngeal Nasopharyngeal Swab     Status: None   Collection Time: 03/12/19  9:31 PM   Specimen: Nasopharyngeal Swab  Result Value Ref Range Status   SARS  Coronavirus 2 NEGATIVE NEGATIVE Final    Comment: (NOTE) SARS-CoV-2 target nucleic acids are NOT DETECTED. The SARS-CoV-2 RNA is generally detectable in upper and lower respiratory specimens during the acute phase of infection. Negative results do not preclude SARS-CoV-2 infection, do not rule out co-infections with other pathogens, and should not be used as the sole basis for treatment or other patient management decisions. Negative results must be combined with clinical observations, patient history, and epidemiological information. The expected result is Negative. Fact Sheet for Patients: SugarRoll.be Fact Sheet for Healthcare Providers: https://www.woods-mathews.com/ This test is not yet approved or cleared by the [place] FDA and  has been authorized for detection and/or diagnosis of SARS-CoV-2 by FDA under an Emergency Use Authorization (EUA). This EUA will remain  in effect (meaning this test can  be used) for the duration of the COVID-19 declaration under Section 56 4(b)(1) of the Act, 21 U.S.C. section 360bbb-3(b)(1), unless the authorization is terminated or revoked sooner. Performed at [facility], [place] [address] [place],  [address]       Studies: CT HEAD WO CONTRAST  Result Date: 03/13/2019 CLINICAL DATA:  Per RN notes: RN heard a loud thump from patient's room, upon entering the room pt was found to be in the floor laying on his right side. Both side rails up on stretcher. Pt states he was trying to get to the bathroom, call bell in reach, pt reports "that thing doesn't work sometimes". Pt reports he hit his head on the padded bench beside his bed during the fall. Pt assisted to his feet by this RN and Laurence Aly and assisted to toilet where pt had a bowel movement. Dr. Renne Crigler informed of pt fall and at bedside at 1605. EXAM: CT HEAD WITHOUT CONTRAST TECHNIQUE: Contiguous axial images were obtained from the base of the skull through the vertex without intravenous contrast. COMPARISON:  08/08/2013 FINDINGS: Brain: No evidence of acute infarction, hemorrhage, hydrocephalus, extra-axial collection or mass lesion/mass effect. There is ventricular and sulcal enlargement reflecting mild diffuse atrophy. Patchy periventricular white matter hypoattenuation is also noted consistent with chronic microvascular ischemic change. 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 Vascular: No hyperdense vessel or unexpected calcification. Skull: Normal. Negative for fracture or focal lesion. Sinuses/Orbits: Globes and orbits are unremarkable. Chronic near complete opacification of the left maxillary sinus. Remaining visualized sinuses are clear. Other: None. IMPRESSION: 1. No acute intracranial abnormalities. 2. Mild atrophy and chronic microvascular ischemic change. 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 3. Chronic opacification of the left maxillary sinus. Electronically Signed   By: Lajean Manes M.D.    On: 03/13/2019 17:01    Scheduled Meds: . amLODipine  10 mg Oral Daily  . atorvastatin  10 mg Oral Daily  . calcium acetate  1,334 mg Oral TID WC  . gabapentin  200 mg Oral BID  . gentamicin cream  1 application Topical Daily  . heparin  5,000 Units Subcutaneous Q8H  . insulin aspart  0-15 Units Subcutaneous TID WC  . levETIRAcetam  500 mg Oral BID  . levothyroxine  200 mcg Oral q morning - 10a  . lipase/protease/amylase  24,000 Units Oral TID  . losartan  100 mg Oral Daily  . metoprolol succinate  50 mg Oral Daily  . sodium bicarbonate  650 mg Oral BID    Continuous Infusions: . dialysis solution 1.5% low-MG/low-CA       LOS: 1 day     Annita Brod, MD [facility]  To reach  me or the doctor on call, go to: [email or web address] Password [facility]  03/14/2019, 4:16 PM

## 2019-03-14 NOTE — Progress Notes (Signed)
[facility], [place] 03/14/19  Subjective:   Hospital day # 1  States that he realizes he was confused therefore he is admitted to the hospital but feels his mental status is improving Denies any acute shortness of breath Trying to eat some Renal: 01/15 0701 - 01/16 0700 In: -  Out: 200 [Urine:200] Lab Results  Component Value Date   CREATININE 9.39 (H) 03/14/2019   CREATININE 10.37 (H) 03/13/2019   CREATININE 10.31 (H) 03/12/2019     Objective:  Vital signs in last 24 hours:  Temp:  [97.6 F (36.4 C)-98.5 F (36.9 C)] 97.9 F (36.6 C) (01/16 1601) Pulse Rate:  [55-71] 71 (01/16 1601) Resp:  [15-20] 15 (01/16 1601) BP: (136-168)/(60-83) 143/70 (01/16 1601) SpO2:  [100 %] 100 % (01/16 1601)  Weight change:  Filed Weights   03/12/19 1924  Weight: 93.9 kg    Intake/Output:    Intake/Output Summary (Last 24 hours) at 03/14/2019 1724 Last data filed at 03/14/2019 1030 Gross per 24 hour  Intake --  Output 200 ml  Net -200 ml     Physical Exam: General:  No acute distress, laying in the bed  HEENT  anicteric, moist oral mucous membranes  Pulm/lungs  normal breathing effort, clear to auscultation  CVS/Heart  no rub or gallop  Abdomen:   Soft, nontender, PD catheter in place  Extremities:  No edema  Neurologic:  Alert, oriented  Skin:  No acute rashes  Access:  PD catheter       Basic Metabolic Panel:  Recent Labs  Lab 03/12/19 1947 03/13/19 0436 03/14/19 0623  NA 135 135 137  K 3.8 3.9 3.6  CL 103 106 102  CO2 14* 17* 19*  GLUCOSE 136* 107* 233*  BUN 67* 66* 59*  CREATININE 10.31* 10.37* 9.39*  CALCIUM 5.6* 5.6* 6.5*  MG 1.4*  --  1.8  PHOS 9.1*  --  9.0*     CBC: Recent Labs  Lab 03/12/19 1947 03/13/19 0436 03/14/19 0623  WBC 8.1 7.2 5.6  NEUTROABS 6.0  --   --   HGB 9.0* 8.1* 8.3*  HCT 26.5* 24.0* 24.9*  MCV 95.7 96.0 96.5  PLT 209 183 178     No results found for: HEPBSAG, HEPBSAB,  HEPBIGM    Microbiology:  Recent Results (from the past 240 hour(s))  SARS CORONAVIRUS 2 (TAT 6-24 HRS) Nasopharyngeal Nasopharyngeal Swab     Status: None   Collection Time: 03/12/19  9:31 PM   Specimen: Nasopharyngeal Swab  Result Value Ref Range Status   SARS Coronavirus 2 NEGATIVE NEGATIVE Final    Comment: (NOTE) SARS-CoV-2 target nucleic acids are NOT DETECTED. The SARS-CoV-2 RNA is generally detectable in upper and lower respiratory specimens during the acute phase of infection. Negative results do not preclude SARS-CoV-2 infection, do not rule out co-infections with other pathogens, and should not be used as the sole basis for treatment or other patient management decisions. Negative results must be combined with clinical observations, patient history, and epidemiological information. The expected result is Negative. Fact Sheet for Patients: SugarRoll.be Fact Sheet for Healthcare Providers: https://www.woods-mathews.com/ This test is not yet approved or cleared by the [place] FDA and  has been authorized for detection and/or diagnosis of SARS-CoV-2 by FDA under an Emergency Use Authorization (EUA). This EUA will remain  in effect (meaning this test can be used) for the duration of the COVID-19 declaration under Section 56 4(b)(1) of the Act, 21 U.S.C. section 360bbb-3(b)(1),  unless the authorization is terminated or revoked sooner. Performed at [facility], [place] [address] [place], [place] [address]     Coagulation Studies: No results for input(s): LABPROT, INR in the last 72 hours.  Urinalysis: No results for input(s): COLORURINE, LABSPEC, PHURINE, GLUCOSEU, HGBUR, BILIRUBINUR, KETONESUR, PROTEINUR, UROBILINOGEN, NITRITE, LEUKOCYTESUR in the last 72 hours.  Invalid input(s): APPERANCEUR    Imaging: CT HEAD WO CONTRAST  Result Date: 03/13/2019 CLINICAL DATA:  Per RN notes: RN heard a loud thump from  patient's room, upon entering the room pt was found to be in the floor laying on his right side. Both side rails up on stretcher. Pt states he was trying to get to the bathroom, call bell in reach, pt reports "that thing doesn't work sometimes". Pt reports he hit his head on the padded bench beside his bed during the fall. Pt assisted to his feet by this RN and Laurence Aly and assisted to toilet where pt had a bowel movement. Dr. Renne Crigler informed of pt fall and at bedside at 1605. EXAM: CT HEAD WITHOUT CONTRAST TECHNIQUE: Contiguous axial images were obtained from the base of the skull through the vertex without intravenous contrast. COMPARISON:  08/08/2013 FINDINGS: Brain: No evidence of acute infarction, hemorrhage, hydrocephalus, extra-axial collection or mass lesion/mass effect. There is ventricular and sulcal enlargement reflecting mild diffuse atrophy. Patchy periventricular white matter hypoattenuation is also noted consistent with chronic microvascular ischemic change. 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 Vascular: No hyperdense vessel or unexpected calcification. Skull: Normal. Negative for fracture or focal lesion. Sinuses/Orbits: Globes and orbits are unremarkable. Chronic near complete opacification of the left maxillary sinus. Remaining visualized sinuses are clear. Other: None. IMPRESSION: 1. No acute intracranial abnormalities. 2. Mild atrophy and chronic microvascular ischemic change. 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 3. Chronic opacification of the left maxillary sinus. Electronically Signed   By: Lajean Manes M.D.   On: 03/13/2019 17:01     Medications:   . dialysis solution 1.5% low-MG/low-CA     . amLODipine  10 mg Oral Daily  . atorvastatin  10 mg Oral Daily  . calcium acetate  1,334 mg Oral TID WC  . gabapentin  200 mg Oral BID  . gentamicin cream  1 application Topical Daily  . heparin  5,000 Units Subcutaneous Q8H  . insulin aspart  0-15 Units Subcutaneous TID WC  . levETIRAcetam   500 mg Oral BID  . levothyroxine  200 mcg Oral q morning - 10a  . lipase/protease/amylase  24,000 Units Oral TID  . losartan  100 mg Oral Daily  . metoprolol succinate  50 mg Oral Daily  . sodium bicarbonate  650 mg Oral BID     Assessment/ Plan:  69 y.o. male with end stage renal disease on peritoneal dialysis, hypertension, diabetes mellitus type II, seizure disorder, nephrolithiasis, hypothyroidism, hyperlipidemia, depression, BPH, COPD  admitted on 03/12/2019 for Hypocalcemia [E83.51] Hypomagnesemia [E83.42] Prolonged Q-T interval on ECG [R94.31] Generalized weakness [R53.1]  [facility] 94kg CCPD 9 hours 5 exchanges 25100mL fills  Dry during daytime  #End-stage renal disease Patient reports missing couple of dialysis treatments prior to admission.  He states it was because he was feeling bad He also misses treatments regularly stating that his body can only handle so much dialysis At present, continue CCPD based on home regimen  #Secondary hyperparathyroidism with severe hypocalcemia Lab Results  Component Value Date   PTH 91 (H) 03/13/2019   CALCIUM 6.5 (L) 03/14/2019  PHOS 9.0 (H) 03/14/2019   High phosphorus and low calcium is noted Encourage compliance with binders-PhosLo Start oral calcium with vitamin D  #Anemia of chronic kidney disease Lab Results  Component Value Date   HGB 8.3 (L) 03/14/2019  Start Epogen subcu      LOS: 1 Vincente Asbridge 1/16/20215:24 PM  [place], [place]  Note: This note was prepared with Dragon dictation. Any transcription errors are unintentional

## 2019-03-14 NOTE — Progress Notes (Signed)
Pd completed 

## 2019-03-14 NOTE — Plan of Care (Signed)
  Problem: Safety: Goal: Ability to remain free from injury will improve Outcome: Progressing   

## 2019-03-15 ENCOUNTER — Inpatient Hospital Stay: Payer: Medicare PPO

## 2019-03-15 DIAGNOSIS — H02402 Unspecified ptosis of left eyelid: Secondary | ICD-10-CM | POA: Clinically undetermined

## 2019-03-15 LAB — GLUCOSE, CAPILLARY
Glucose-Capillary: 216 mg/dL — ABNORMAL HIGH (ref 70–99)
Glucose-Capillary: 81 mg/dL (ref 70–99)
Glucose-Capillary: 186 mg/dL — ABNORMAL HIGH (ref 70–99)
Glucose-Capillary: 176 mg/dL — ABNORMAL HIGH (ref 70–99)

## 2019-03-15 MED ORDER — ASPIRIN EC 81 MG PO TBEC
81.0000 mg | DELAYED_RELEASE_TABLET | Freq: Every day | ORAL | Status: DC
  Administered 2019-03-15 – 2019-03-20 (×6): 81 mg via ORAL
  Filled 2019-03-15 (×6): qty 1

## 2019-03-15 MED ORDER — ATORVASTATIN CALCIUM 80 MG PO TABS
80.0000 mg | ORAL_TABLET | Freq: Every day | ORAL | Status: DC
  Administered 2019-03-16 – 2019-03-19 (×3): 80 mg via ORAL
  Filled 2019-03-15 (×4): qty 1

## 2019-03-15 MED ORDER — CLOPIDOGREL BISULFATE 75 MG PO TABS
75.0000 mg | ORAL_TABLET | Freq: Every day | ORAL | Status: DC
  Administered 2019-03-15 – 2019-03-20 (×6): 75 mg via ORAL
  Filled 2019-03-15 (×6): qty 1

## 2019-03-15 NOTE — Progress Notes (Signed)
[facility] Tx completed, pt denies any complaints or issues during [place], UF 667mL clear yellow effluent.    03/15/19 1044  Completion  Effluent Appearance Yellow;Clear  Treatment Status Complete  Fluid Balance - CCPD  Total Output for Exchanges (mL) 612 ml

## 2019-03-15 NOTE — Progress Notes (Signed)
[place], [place] 03/15/19  Subjective:   Hospital day # 2  States that he realizes he was confused therefore he is admitted to the hospital but feels his mental status is improving Denies any acute shortness of breath Eating lunch when seen No problems reported with peritoneal dialysis Renal: No intake/output data recorded. Lab Results  Component Value Date   CREATININE 9.39 (H) 03/14/2019   CREATININE 10.37 (H) 03/13/2019   CREATININE 10.31 (H) 03/12/2019     Objective:  Vital signs in last 24 hours:  Temp:  [97.4 F (36.3 C)-98 F (36.7 C)] 97.4 F (36.3 C) (01/17 0739) Pulse Rate:  [65-75] 65 (01/17 0739) Resp:  [15-20] 19 (01/17 0739) BP: (119-143)/(62-70) 119/67 (01/17 0739) SpO2:  [97 %-100 %] 100 % (01/17 0739) Weight:  [201.6 kg] 201.6 kg (01/17 0530)  Weight change:  Filed Weights   03/12/19 1924 03/15/19 0530  Weight: 93.9 kg (!) 201.6 kg    Intake/Output:    Intake/Output Summary (Last 24 hours) at 03/15/2019 1337 Last data filed at 03/15/2019 1044 Gross per 24 hour  Intake --  Output 612 ml  Net -612 ml     Physical Exam: General:  No acute distress, laying in the bed  HEENT  anicteric, moist oral mucous membranes  Pulm/lungs  normal breathing effort, clear to auscultation  CVS/Heart  no rub or gallop  Abdomen:   Soft, nontender, PD catheter in place  Extremities:  No edema  Neurologic:  Alert, oriented  Skin:  No acute rashes  Access:  PD catheter       Basic Metabolic Panel:  Recent Labs  Lab 03/12/19 1947 03/13/19 0436 03/14/19 0623  NA 135 135 137  K 3.8 3.9 3.6  CL 103 106 102  CO2 14* 17* 19*  GLUCOSE 136* 107* 233*  BUN 67* 66* 59*  CREATININE 10.31* 10.37* 9.39*  CALCIUM 5.6* 5.6* 6.5*  MG 1.4*  --  1.8  PHOS 9.1*  --  9.0*     CBC: Recent Labs  Lab 03/12/19 1947 03/13/19 0436 03/14/19 0623  WBC 8.1 7.2 5.6  NEUTROABS 6.0  --   --   HGB 9.0* 8.1* 8.3*  HCT 26.5* 24.0* 24.9*   MCV 95.7 96.0 96.5  PLT 209 183 178     No results found for: HEPBSAG, HEPBSAB, HEPBIGM    Microbiology:  Recent Results (from the past 240 hour(s))  SARS CORONAVIRUS 2 (TAT 6-24 HRS) Nasopharyngeal Nasopharyngeal Swab     Status: None   Collection Time: 03/12/19  9:31 PM   Specimen: Nasopharyngeal Swab  Result Value Ref Range Status   SARS Coronavirus 2 NEGATIVE NEGATIVE Final    Comment: (NOTE) SARS-CoV-2 target nucleic acids are NOT DETECTED. The SARS-CoV-2 RNA is generally detectable in upper and lower respiratory specimens during the acute phase of infection. Negative results do not preclude SARS-CoV-2 infection, do not rule out co-infections with other pathogens, and should not be used as the sole basis for treatment or other patient management decisions. Negative results must be combined with clinical observations, patient history, and epidemiological information. The expected result is Negative. Fact Sheet for Patients: SugarRoll.be Fact Sheet for Healthcare Providers: https://www.woods-mathews.com/ This test is not yet approved or cleared by the [place] FDA and  has been authorized for detection and/or diagnosis of SARS-CoV-2 by FDA under an Emergency Use Authorization (EUA). This EUA will remain  in effect (meaning this test can be used) for the duration of the  COVID-19 declaration under Section 56 4(b)(1) of the Act, 21 U.S.C. section 360bbb-3(b)(1), unless the authorization is terminated or revoked sooner. Performed at [facility], [place] [address], [place], [place] [address]     Coagulation Studies: No results for input(s): LABPROT, INR in the last 72 hours.  Urinalysis: No results for input(s): COLORURINE, LABSPEC, PHURINE, GLUCOSEU, HGBUR, BILIRUBINUR, KETONESUR, PROTEINUR, UROBILINOGEN, NITRITE, LEUKOCYTESUR in the last 72 hours.  Invalid input(s): APPERANCEUR    Imaging: CT HEAD WO  CONTRAST  Result Date: 03/13/2019 CLINICAL DATA:  Per RN notes: RN heard a loud thump from patient's room, upon entering the room pt was found to be in the floor laying on his right side. Both side rails up on stretcher. Pt states he was trying to get to the bathroom, call bell in reach, pt reports "that thing doesn't work sometimes". Pt reports he hit his head on the padded bench beside his bed during the fall. Pt assisted to his feet by this RN and Laurence Aly and assisted to toilet where pt had a bowel movement. Dr. Renne Crigler informed of pt fall and at bedside at 1605. EXAM: CT HEAD WITHOUT CONTRAST TECHNIQUE: Contiguous axial images were obtained from the base of the skull through the vertex without intravenous contrast. COMPARISON:  08/08/2013 FINDINGS: Brain: No evidence of acute infarction, hemorrhage, hydrocephalus, extra-axial collection or mass lesion/mass effect. There is ventricular and sulcal enlargement reflecting mild diffuse atrophy. Patchy periventricular white matter hypoattenuation is also noted consistent with chronic microvascular ischemic change. 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 Vascular: No hyperdense vessel or unexpected calcification. Skull: Normal. Negative for fracture or focal lesion. Sinuses/Orbits: Globes and orbits are unremarkable. Chronic near complete opacification of the left maxillary sinus. Remaining visualized sinuses are clear. Other: None. IMPRESSION: 1. No acute intracranial abnormalities. 2. Mild atrophy and chronic microvascular ischemic change. 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 3. Chronic opacification of the left maxillary sinus. Electronically Signed   By: Lajean Manes M.D.   On: 03/13/2019 17:01     Medications:   . dialysis solution 1.5% low-MG/low-CA     . amLODipine  10 mg Oral Daily  . atorvastatin  10 mg Oral Daily  . calcium acetate  1,334 mg Oral TID WC  . calcium carbonate  1,250 mg Oral TID WC  . epoetin (EPOGEN/PROCRIT) injection  10,000 Units  Subcutaneous Weekly  . gabapentin  200 mg Oral BID  . gentamicin cream  1 application Topical Daily  . heparin  5,000 Units Subcutaneous Q8H  . insulin aspart  0-15 Units Subcutaneous TID WC  . levETIRAcetam  500 mg Oral BID  . levothyroxine  200 mcg Oral q morning - 10a  . lipase/protease/amylase  24,000 Units Oral TID  . losartan  100 mg Oral Daily  . metoprolol succinate  50 mg Oral Daily  . sodium bicarbonate  650 mg Oral BID     Assessment/ Plan:  69 y.o. male with end stage renal disease on peritoneal dialysis, hypertension, diabetes mellitus type II, seizure disorder, nephrolithiasis, hypothyroidism, hyperlipidemia, depression, BPH, COPD  admitted on 03/12/2019 for Hypocalcemia [E83.51] Hypomagnesemia [E83.42] Prolonged Q-T interval on ECG [R94.31] Generalized weakness [R53.1]  [facility] 94kg CCPD 9 hours 5 exchanges 25109mL fills  Dry during daytime  #End-stage renal disease Patient reports missing couple of dialysis treatments prior to admission.  He states it was because he was feeling bad He also misses treatments regularly stating that his body can only handle so much dialysis  At present, continue CCPD based on home regimen Discharge planning: If patient is sent to SNF/rehab, he will need to convert to hemodialysis with PermCath placement  #Secondary hyperparathyroidism with severe hypocalcemia Lab Results  Component Value Date   PTH 91 (H) 03/13/2019   CALCIUM 6.5 (L) 03/14/2019   PHOS 9.0 (H) 03/14/2019   High phosphorus and low calcium is noted Encourage compliance with binders-PhosLo Continue oral calcium with vitamin D  #Anemia of chronic kidney disease Lab Results  Component Value Date   HGB 8.3 (L) 03/14/2019  Continue Epogen subcu weekly (Saturday)      LOS: 2 Jadalyn Oliveri Candiss Norse 1/17/20211:37 PM  [place], [place]  Note: This note was prepared with Dragon dictation. Any  transcription errors are unintentional

## 2019-03-15 NOTE — Plan of Care (Signed)
  Problem: Safety: Goal: Ability to remain free from injury will improve Outcome: Progressing   

## 2019-03-15 NOTE — Progress Notes (Signed)
Pre CCPD Assessment    03/15/19 2130  Neurological  Level of Consciousness Alert  Orientation Level Oriented X4  Respiratory  Respiratory Pattern Regular  Chest Assessment Chest expansion symmetrical  Bilateral Breath Sounds Diminished  Cardiac  Pulse Regular  Heart Sounds S1, S2  Vascular  R Radial Pulse +2  L Radial Pulse +2  Edema Generalized  Psychosocial  Psychosocial (WDL) WDL

## 2019-03-15 NOTE — Progress Notes (Signed)
[facility] Started    03/15/19 2131  Cycler Setup  Total Number of Exchanges 5  Fill Volume 2500  Dianeal Solution Dextrose 1.5% in 6000 mL  Last Fill Volume 0  Fill Time - Minute(s) 10  Dwell Time - Hour(s) 1  Dwell Time - Minute(s) 28  Drain Time - Minute(s) 20 mins  Exit Site Care Performed Yes  Completion  Exit Site Care Performed Yes  Treatment Status Started  Education / Care Plan  Dialysis Education Provided Yes  Documented Education in Care Plan Yes

## 2019-03-15 NOTE — Progress Notes (Addendum)
PROGRESS NOTE  Kyle [name] DHR:[id number] DOB: 06/19/1950 DOA: 03/12/2019 PCP: Idelle Crouch, MD  HPI/Recap of past 77 hours: 69 year old male with past medical history of end-stage renal disease on peritoneal dialysis, COPD, hypertension and seizure disorder presented to the hospital on 1/14 with concern for abnormal labs.  He has been following with GI for issues with persistent diarrhea and bowel incontinence and on routine blood work was noted to have significant hypocalcemia with a calcium on admission that was less than 5.5.  Patient has had this in the past and takes Tums 3 times a day at home.  According to his wife, the patient does not sleep well so at times he will skip his home peritoneal dialysis.  There have been reports he is been having progressive weakness.  His wife is able to clarify that outside of the physical therapy at home health PT does, he does not move or do any exercises beyond that.  Following admission, patient confused fell out of bed hitting his head while in the ER when he attempted to get out of bed on his own.  He was evaluated and no signs of any head bleed or stroke by head CT.  He is noted to have left-sided ptosis which he says started after he fell and hit his head although this is not clear if this is the case.  MRI pending.  Following hospitalization, patient received peritoneal dialysis and electrolytes have improved although calcium is not yet normalized.  Patient has become more alert, but is still quite weak although he appears to be much more alert.  He does not listen when nursing tells him not to get up on his own, and attempts to do so, but is still so weak that he immediately falls back to bed.  In speaking with his wife, he is horribly deconditioned, even at home and outside of when home physical therapy was coming to his house, he does not really ambulate or do any exercise.  Assessment/Plan: Principal Problem:  Hypocalcemia/hypophosphatemia/hypomagnesemia from secondary hyperparathyroidism in patient with end-stage renal disease on peritoneal dialysis: Nephrology consulted.  Complicated by noncompliance.  Has received peritoneal dialysis here.  According to nephrology, he is closer to baseline.  Calcium notes improvement. Active Problems:   Chronic obstructive pulmonary disease ([place]): Stable.  Ptosis: Patient states this happened after he fell out of bed in the emergency room when he tried to get up on his own.  CT scan at that time was unremarkable.  No other focal neurological findings.  Checking MRI.    HLD (hyperlipidemia)   Seizure ([place]): Stable.  Continue home medications.    Type 2 diabetes mellitus ([place]): CBGs overall stable.    Prolonged QT interval   Essential hypertension: Blood pressures slightly on the higher end.  Managed by peritoneal dialysis and home medications.    Hypothyroidism: Continue Synthroid.    Physical deconditioning: No vexing issue.  The patient himself is not very motivated and has become quite deconditioned even at home.  He is currently barely able to stand.  This is in part due to electrolyte abnormalities but also due to his refusal to participate in the exercises recommended by physical therapy.  His wife tells me that she is unable to care for him.  There are no skilled nursing facilities that will take him while he is on peritoneal dialysis.  Will need to discuss with patient options such as going to a nursing facility and if so, he may  need to convert to hemodialysis.  overweight: Meets criteria with BMI greater than 25.  Code Status: Full code   Family Communication: Left message for wife  Disposition Plan: Awaiting evaluation by PT/OT.  Given his significant deconditioning, he likely would benefit from SNF.  This will be difficult since most SNFs do not do PD.  Will check and see if any facilities offer that.  If not, he will need to go home with home health  PT.  Wife has concerns, because she states that outside of the home health PT, he does not do any other physical therapy and has remained a week at home.   Consultants:  Nephrology  Procedures:  Peritoneal dialysis  Antimicrobials:  None  DVT prophylaxis: Heparin   Objective: Vitals:   03/15/19 0420 03/15/19 0739  BP: 131/63 119/67  Pulse: 69 65  Resp: 20 19  Temp: 98 F (36.7 C) (!) 97.4 F (36.3 C)  SpO2: 100% 100%    Intake/Output Summary (Last 24 hours) at 03/15/2019 1223 Last data filed at 03/15/2019 1044 Gross per 24 hour  Intake --  Output 612 ml  Net -612 ml   Filed Weights   03/12/19 1924 03/15/19 0530  Weight: 93.9 kg (!) 201.6 kg   Body mass index is 60.28 kg/m.  Exam:   General: Alert and oriented x2 or 3, no acute distress  Cardiovascular: Regular rate and rhythm, S1-S2  Respiratory: Clear to auscultation bilaterally  Abdomen: Soft, nontender, nondistended, positive bowel sounds  Musculoskeletal: No clubbing or cyanosis or edema  Skin: No skin breaks, tears or lesions  Psychiatry: Appropriate, no evidence of psychoses  Neuro: Persistent left-sided ptosis.  Other than generalized none focal weakness, no other neurological deficits   Data Reviewed: CBC: Recent Labs  Lab 03/12/19 1947 03/13/19 0436 03/14/19 0623  WBC 8.1 7.2 5.6  NEUTROABS 6.0  --   --   HGB 9.0* 8.1* 8.3*  HCT 26.5* 24.0* 24.9*  MCV 95.7 96.0 96.5  PLT 209 183 160   Basic Metabolic Panel: Recent Labs  Lab 03/12/19 1947 03/13/19 0436 03/14/19 0623  NA 135 135 137  K 3.8 3.9 3.6  CL 103 106 102  CO2 14* 17* 19*  GLUCOSE 136* 107* 233*  BUN 67* 66* 59*  CREATININE 10.31* 10.37* 9.39*  CALCIUM 5.6* 5.6* 6.5*  MG 1.4*  --  1.8  PHOS 9.1*  --  9.0*   GFR: Estimated Creatinine Clearance: 13.2 mL/min (A) (by C-G formula based on SCr of 9.39 mg/dL (H)). Liver Function Tests: Recent Labs  Lab 03/13/19 0436 03/14/19 0623  AST 14* 14*  ALT 9 10   ALKPHOS 86 92  BILITOT 0.7 0.6  PROT 5.1* 5.2*  ALBUMIN 2.1* 2.1*   No results for input(s): LIPASE, AMYLASE in the last 168 hours. No results for input(s): AMMONIA in the last 168 hours. Coagulation Profile: No results for input(s): INR, PROTIME in the last 168 hours. Cardiac Enzymes: No results for input(s): CKTOTAL, CKMB, CKMBINDEX, TROPONINI in the last 168 hours. BNP (last 3 results) No results for input(s): PROBNP in the last 8760 hours. HbA1C: No results for input(s): HGBA1C in the last 72 hours. CBG: Recent Labs  Lab 03/14/19 1632 03/14/19 2041 03/14/19 2352 03/15/19 0844 03/15/19 1203  GLUCAP 210* 89 155* 216* 81   Lipid Profile: No results for input(s): CHOL, HDL, LDLCALC, TRIG, CHOLHDL, LDLDIRECT in the last 72 hours. Thyroid Function Tests: No results for input(s): TSH, T4TOTAL, FREET4, T3FREE, THYROIDAB in the  last 72 hours. Anemia Panel: No results for input(s): VITAMINB12, FOLATE, FERRITIN, TIBC, IRON, RETICCTPCT in the last 72 hours. Urine analysis:    Component Value Date/Time   COLORURINE YELLOW (A) 07/27/2016 1648   APPEARANCEUR CLEAR (A) 07/27/2016 1648   APPEARANCEUR Clear 08/08/2013 1947   LABSPEC 1.021 07/27/2016 1648   LABSPEC 1.012 08/08/2013 1947   PHURINE 5.0 07/27/2016 1648   GLUCOSEU >=500 (A) 07/27/2016 1648   GLUCOSEU 50 mg/dL 08/08/2013 1947   HGBUR SMALL (A) 07/27/2016 1648   BILIRUBINUR NEGATIVE 07/27/2016 1648   BILIRUBINUR Negative 08/08/2013 1947   KETONESUR NEGATIVE 07/27/2016 1648   PROTEINUR >=300 (A) 07/27/2016 1648   NITRITE NEGATIVE 07/27/2016 1648   LEUKOCYTESUR NEGATIVE 07/27/2016 1648   LEUKOCYTESUR Negative 08/08/2013 1947   Sepsis Labs: @LABRCNTIP (procalcitonin:4,lacticidven:4)  ) Recent Results (from the past 240 hour(s))  SARS CORONAVIRUS 2 (TAT 6-24 HRS) Nasopharyngeal Nasopharyngeal Swab     Status: None   Collection Time: 03/12/19  9:31 PM   Specimen: Nasopharyngeal Swab  Result Value Ref Range Status    SARS Coronavirus 2 NEGATIVE NEGATIVE Final    Comment: (NOTE) SARS-CoV-2 target nucleic acids are NOT DETECTED. The SARS-CoV-2 RNA is generally detectable in upper and lower respiratory specimens during the acute phase of infection. Negative results do not preclude SARS-CoV-2 infection, do not rule out co-infections with other pathogens, and should not be used as the sole basis for treatment or other patient management decisions. Negative results must be combined with clinical observations, patient history, and epidemiological information. The expected result is Negative. Fact Sheet for Patients: SugarRoll.be Fact Sheet for Healthcare Providers: https://www.woods-mathews.com/ This test is not yet approved or cleared by the [place] FDA and  has been authorized for detection and/or diagnosis of SARS-CoV-2 by FDA under an Emergency Use Authorization (EUA). This EUA will remain  in effect (meaning this test can be used) for the duration of the COVID-19 declaration under Section 56 4(b)(1) of the Act, 21 U.S.C. section 360bbb-3(b)(1), unless the authorization is terminated or revoked sooner. Performed at [facility], [place] [address] [place], [place] [address]       Studies: No results found.  Scheduled Meds: . amLODipine  10 mg Oral Daily  . atorvastatin  10 mg Oral Daily  . calcium acetate  1,334 mg Oral TID WC  . calcium carbonate  1,250 mg Oral TID WC  . epoetin (EPOGEN/PROCRIT) injection  10,000 Units Subcutaneous Weekly  . gabapentin  200 mg Oral BID  . gentamicin cream  1 application Topical Daily  . heparin  5,000 Units Subcutaneous Q8H  . insulin aspart  0-15 Units Subcutaneous TID WC  . levETIRAcetam  500 mg Oral BID  . levothyroxine  200 mcg Oral q morning - 10a  . lipase/protease/amylase  24,000 Units Oral TID  . losartan  100 mg Oral Daily  . metoprolol succinate  50 mg Oral Daily  . sodium bicarbonate  650 mg  Oral BID    Continuous Infusions: . dialysis solution 1.5% low-MG/low-CA       LOS: 2 days     Annita Brod, MD [facility]  To reach me or the doctor on call, go to: [email or web address] Password [facility]  03/15/2019, 12:23 PM

## 2019-03-15 NOTE — Evaluation (Signed)
Physical Therapy Evaluation Patient Details Name: Kyle [name] MRN: [id number] DOB: 1950-11-20 Today's Date: 03/15/2019   History of Present Illness  Pt admitted for hypocalcemia. History includes ESRD on peritoneal dialysis, COPD, HTN, and seizures. Pt currently receiving HHPT, however has been declining physically and reports multiple falls.   Clinical Impression  Pt is a pleasant 69 year old male who was admitted for hypocalcemia. Pt performs bed mobility/transfers with mod assist and unable to ambulate at this time. Pt demonstrates deficits with strength/balance/safety awareness. Not safe to perform further OOB mobility at this time. Heavy R side leaning, although does improve after balance training. Currently not at baseline level. Would benefit from skilled PT to address above deficits and promote optimal return to PLOF; recommend transition to STR upon discharge from acute hospitalization.     Follow Up Recommendations SNF    Equipment Recommendations  None recommended by PT    Recommendations for Other Services       Precautions / Restrictions Precautions Precautions: Fall Restrictions Weight Bearing Restrictions: No      Mobility  Bed Mobility Overal bed mobility: Needs Assistance Bed Mobility: Supine to Sit     Supine to sit: Mod assist     General bed mobility comments: needs assist to come to sit at EOB. Poor sitting balance with falling/pushing towards R side. Multiple LOB noted during seated position, unable to maintain without min assist.  Transfers Overall transfer level: Needs assistance Equipment used: Rolling walker (2 wheeled) Transfers: Sit to/from Stand Sit to Stand: Mod assist         General transfer comment: multiple attempts for sit<>Stand, needing mod assist and demonstrates significant L Leaning during standing. SLight buckling of B knees. Not safe to continue mobility  Ambulation/Gait             General Gait Details:  unsafe  Stairs            Wheelchair Mobility    Modified Rankin (Stroke Patients Only)       Balance Overall balance assessment: Needs assistance;History of Falls Sitting-balance support: Feet supported Sitting balance-Leahy Scale: Poor Sitting balance - Comments: R side leaning   Standing balance support: Bilateral upper extremity supported Standing balance-Leahy Scale: Poor Standing balance comment: R leaning                             Pertinent Vitals/Pain Pain Assessment: No/denies pain    Home Living Family/patient expects to be discharged to:: Private residence Living Arrangements: Spouse/significant other Available Help at Discharge: Family Type of Home: House Home Access: Stairs to enter Entrance Stairs-Rails: Can reach both Entrance Stairs-Number of Steps: 6 Home Layout: One level Home Equipment: Walker - 2 wheels;Cane - single point      Prior Function Level of Independence: Needs assistance   Gait / Transfers Assistance Needed: ambulates short distances around home with SPC. Typically stays in the bed as he "lacks' motivation per patient.           Hand Dominance        Extremity/Trunk Assessment   Upper Extremity Assessment Upper Extremity Assessment: Generalized weakness(B UE grossly 4/5)    Lower Extremity Assessment Lower Extremity Assessment: Generalized weakness(B LE grossly 4/5)       Communication   Communication: No difficulties  Cognition Arousal/Alertness: Awake/alert Behavior During Therapy: Impulsive Overall Cognitive Status: Within Functional Limits for tasks assessed  General Comments      Exercises Other Exercises Other Exercises: Supine/seated ther-ex including B LE SLRs, side crunches with pillow under IT, elbow touches towards bed, and cross body reaching. All ther-ex performed x 10 reps with min assist   Assessment/Plan    PT Assessment  Patient needs continued PT services  PT Problem List Decreased strength;Decreased activity tolerance;Decreased balance;Decreased mobility;Decreased safety awareness       PT Treatment Interventions Gait training;Therapeutic exercise;Balance training;DME instruction    PT Goals (Current goals can be found in the Care Plan section)  Acute Rehab PT Goals Patient Stated Goal: to get his balance back PT Goal Formulation: With patient Time For Goal Achievement: 03/29/19 Potential to Achieve Goals: Good    Frequency Min 2X/week   Barriers to discharge        Co-evaluation               AM-PAC PT "6 Clicks" Mobility  Outcome Measure Help needed turning from your back to your side while in a flat bed without using bedrails?: A Little Help needed moving from lying on your back to sitting on the side of a flat bed without using bedrails?: A Lot Help needed moving to and from a bed to a chair (including a wheelchair)?: Total Help needed standing up from a chair using your arms (e.g., wheelchair or bedside chair)?: A Lot Help needed to walk in hospital room?: Total Help needed climbing 3-5 steps with a railing? : Total 6 Click Score: 10    End of Session Equipment Utilized During Treatment: Gait belt Activity Tolerance: Patient tolerated treatment well Patient left: in bed;with bed alarm set Nurse Communication: Mobility status PT Visit Diagnosis: Unsteadiness on feet (R26.81);Repeated falls (R29.6);Muscle weakness (generalized) (M62.81);History of falling (Z91.81);Difficulty in walking, not elsewhere classified (R26.2)    Time: 9166-0600 PT Time Calculation (min) (ACUTE ONLY): 17 min   Charges:   PT Evaluation $PT Eval Low Complexity: 1 Low PT Treatments $Neuromuscular Re-education: 8-22 mins        Greggory Stallion, PT, DPT [phone]   Kyle [name] 03/15/2019, 4:41 PM

## 2019-03-15 NOTE — Progress Notes (Signed)
RN reports acute infarct reported on MRI performed wearlier in evening IMPRESSION: Acute infarction of the left paramedian mid brain. No hemorrhage or mass effect.  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 Chronic small-vessel ischemic changes elsewhere throughout the brain as outlined above.  Opacification of the left maxillary sinus with T2 signal loss. This could be due to fungal sinusitis or chronic inspissated mucus.  No identification of time of acute neuro change that may have precipitated event. Noted CT done 1/15 secondary to fall with reported head trauma. with identification of old infarct but no hemorrhage or new areas of infarct   Patient is without changes in neuro assessment findings from what previously found with left eye droop   Patient started on asa, plavix, he is already on statin, although increased to high dose, echo and carotid dopplers ordered.  Neuro consult. Continue with planned rehab eval/treat

## 2019-03-16 ENCOUNTER — Inpatient Hospital Stay
Admit: 2019-03-16 | Discharge: 2019-03-16 | Disposition: A | Discharge status: Home / Self Care | Payer: Medicare PPO | Attending: Acute Care | Admitting: Acute Care

## 2019-03-16 ENCOUNTER — Inpatient Hospital Stay: Payer: Medicare PPO

## 2019-03-16 DIAGNOSIS — I639 Cerebral infarction, unspecified: Secondary | ICD-10-CM

## 2019-03-16 LAB — RENAL FUNCTION PANEL
Albumin: 2.4 g/dL — ABNORMAL LOW (ref 3.5–5.0)
Anion gap: 15 (ref 5–15)
BUN: 50 mg/dL — ABNORMAL HIGH (ref 8–23)
CO2: 24 mmol/L (ref 22–32)
Calcium: 8.1 mg/dL — ABNORMAL LOW (ref 8.9–10.3)
Chloride: 102 mmol/L (ref 98–111)
Creatinine, Ser: 9.22 mg/dL — ABNORMAL HIGH (ref 0.61–1.24)
GFR calc Af Amer: 6 mL/min — ABNORMAL LOW (ref >60)
GFR calc non Af Amer: 5 mL/min — ABNORMAL LOW (ref >60)
Glucose, Bld: 73 mg/dL (ref 70–99)
Phosphorus: 9.5 mg/dL — ABNORMAL HIGH (ref 2.5–4.6)
Potassium: 3.5 mmol/L (ref 3.5–5.1)
Sodium: 141 mmol/L (ref 135–145)

## 2019-03-16 LAB — LIPID PANEL
Cholesterol: 130 mg/dL (ref 0–200)
HDL: 41 mg/dL (ref >40)
LDL Cholesterol: 48 mg/dL (ref 0–99)
Total CHOL/HDL Ratio: 3.2 RATIO
Triglycerides: 207 mg/dL — ABNORMAL HIGH (ref <150)
VLDL: 41 mg/dL — ABNORMAL HIGH (ref 0–40)

## 2019-03-16 LAB — ECHOCARDIOGRAM COMPLETE
Height: 72 in
Weight: 7111.16 oz

## 2019-03-16 LAB — GLUCOSE, CAPILLARY
Glucose-Capillary: 203 mg/dL — ABNORMAL HIGH (ref 70–99)
Glucose-Capillary: 244 mg/dL — ABNORMAL HIGH (ref 70–99)
Glucose-Capillary: 117 mg/dL — ABNORMAL HIGH (ref 70–99)
Glucose-Capillary: 187 mg/dL — ABNORMAL HIGH (ref 70–99)

## 2019-03-16 NOTE — Progress Notes (Signed)
[facility], [place] 03/16/19  Subjective:   Hospital day # 3  Denies any acute shortness of breath Some error alarms on PD machine overnight Patient is being monitored for confusion  Renal: 01/17 0701 - 01/18 0700 In: -  Out: 612  Lab Results  Component Value Date   CREATININE 9.39 (H) 03/14/2019   CREATININE 10.37 (H) 03/13/2019   CREATININE 10.31 (H) 03/12/2019     Objective:  Vital signs in last 24 hours:  Temp:  [98 F (36.7 C)-98.4 F (36.9 C)] 98.4 F (36.9 C) (01/18 0749) Pulse Rate:  [63-74] 63 (01/18 0749) Resp:  [16-20] 18 (01/18 0749) BP: (124-144)/(56-68) 127/66 (01/18 0749) SpO2:  [98 %-100 %] 98 % (01/18 0749)  Weight change:  Filed Weights   03/12/19 1924 03/15/19 0530  Weight: 93.9 kg (!) 201.6 kg    Intake/Output:    Intake/Output Summary (Last 24 hours) at 03/16/2019 1349 Last data filed at 03/16/2019 1002 Gross per 24 hour  Intake 240 ml  Output --  Net 240 ml     Physical Exam: General:  No acute distress, laying in the bed  HEENT  anicteric, moist oral mucous membranes, left ptosis  Pulm/lungs  normal breathing effort, clear to auscultation  CVS/Heart  no rub or gallop  Abdomen:   Soft, nontender, PD catheter in place  Extremities:  No edema  Neurologic:  Alert, oriented to self  Skin:  No acute rashes  Access:  PD catheter       Basic Metabolic Panel:  Recent Labs  Lab 03/12/19 1947 03/13/19 0436 03/14/19 0623  NA 135 135 137  K 3.8 3.9 3.6  CL 103 106 102  CO2 14* 17* 19*  GLUCOSE 136* 107* 233*  BUN 67* 66* 59*  CREATININE 10.31* 10.37* 9.39*  CALCIUM 5.6* 5.6* 6.5*  MG 1.4*  --  1.8  PHOS 9.1*  --  9.0*     CBC: Recent Labs  Lab 03/12/19 1947 03/13/19 0436 03/14/19 0623  WBC 8.1 7.2 5.6  NEUTROABS 6.0  --   --   HGB 9.0* 8.1* 8.3*  HCT 26.5* 24.0* 24.9*  MCV 95.7 96.0 96.5  PLT 209 183 178     No results found for: HEPBSAG, HEPBSAB,  HEPBIGM    Microbiology:  Recent Results (from the past 240 hour(s))  SARS CORONAVIRUS 2 (TAT 6-24 HRS) Nasopharyngeal Nasopharyngeal Swab     Status: None   Collection Time: 03/12/19  9:31 PM   Specimen: Nasopharyngeal Swab  Result Value Ref Range Status   SARS Coronavirus 2 NEGATIVE NEGATIVE Final    Comment: (NOTE) SARS-CoV-2 target nucleic acids are NOT DETECTED. The SARS-CoV-2 RNA is generally detectable in upper and lower respiratory specimens during the acute phase of infection. Negative results do not preclude SARS-CoV-2 infection, do not rule out co-infections with other pathogens, and should not be used as the sole basis for treatment or other patient management decisions. Negative results must be combined with clinical observations, patient history, and epidemiological information. The expected result is Negative. Fact Sheet for Patients: SugarRoll.be Fact Sheet for Healthcare Providers: https://www.woods-mathews.com/ This test is not yet approved or cleared by the [place] FDA and  has been authorized for detection and/or diagnosis of SARS-CoV-2 by FDA under an Emergency Use Authorization (EUA). This EUA will remain  in effect (meaning this test can be used) for the duration of the COVID-19 declaration under Section 56 4(b)(1) of the Act, 21 U.S.C. section 360bbb-3(b)(1), unless  the authorization is terminated or revoked sooner. Performed at [facility], [place] [address] [place], [place] [address]     Coagulation Studies: No results for input(s): LABPROT, INR in the last 72 hours.  Urinalysis: No results for input(s): COLORURINE, LABSPEC, PHURINE, GLUCOSEU, HGBUR, BILIRUBINUR, KETONESUR, PROTEINUR, UROBILINOGEN, NITRITE, LEUKOCYTESUR in the last 72 hours.  Invalid input(s): APPERANCEUR    Imaging: MR BRAIN WO CONTRAST  Result Date: 03/15/2019 CLINICAL DATA:  Renal failure patient.  Fell from the bed.  EXAM: MRI HEAD WITHOUT CONTRAST TECHNIQUE: Multiplanar, multiecho pulse sequences of the brain and surrounding structures were obtained without intravenous contrast. COMPARISON:  Head CT 03/13/2019.  MRI 08/26/2013. FINDINGS: Brain: Diffusion imaging shows acute infarction of the left para median mid brain. No other acute infarction. There is an old infarction of the left para median pons. Few old small vessel cerebellar infarctions. Cerebral hemispheres elsewhere show chronic small-vessel ischemic changes throughout the white matter. Old punctate lacunar infarction left thalamus. Old lacunar infarction of the white matter adjacent to the frontal horn of the right lateral ventricle. No large vessel territory stroke. No mass, hemorrhage, hydrocephalus or extra-axial collection. Vascular: Major vessels at the base of the brain show flow. Skull and upper cervical spine: Negative Sinuses/Orbits: Mucosal inflammation and opacification of the left maxillary sinus. T2 signal loss raises the possibility of fungal sinusitis versus inspissated mucus. Orbits negative. Other: None IMPRESSION: Acute infarction of the left paramedian mid brain. No hemorrhage or mass effect. 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 Chronic small-vessel ischemic changes elsewhere throughout the brain as outlined above. Opacification of the left maxillary sinus with T2 signal loss. This could be due to fungal sinusitis or chronic inspissated mucus. Electronically Signed   By: Nelson Chimes M.D.   On: 03/15/2019 17:58   US Carotid Bilateral  Result Date: 03/16/2019 CLINICAL DATA:  Hypertension, stroke symptoms, hyperlipidemia EXAM: BILATERAL CAROTID DUPLEX ULTRASOUND TECHNIQUE: Pearline Cables scale imaging, color Doppler and duplex ultrasound were performed of bilateral carotid and vertebral arteries in the neck. COMPARISON:  None. FINDINGS: Criteria: Quantification of carotid stenosis is based on velocity parameters that correlate the residual internal  carotid diameter with NASCET-based stenosis levels, using the diameter of the distal internal carotid lumen as the denominator for stenosis measurement. The following velocity measurements were obtained: RIGHT ICA: 108/28 cm/sec CCA: 96/22 cm/sec SYSTOLIC ICA/CCA RATIO:  1.6 ECA: 168 cm/sec LEFT ICA: 86/22 cm/sec CCA: 29/79 cm/sec SYSTOLIC ICA/CCA RATIO:  1.1 ECA: 131 cm/sec RIGHT CAROTID ARTERY: Minor echogenic shadowing plaque formation. No hemodynamically significant right ICA stenosis, velocity elevation, or turbulent flow. Degree of narrowing less than 50%. RIGHT VERTEBRAL ARTERY:  Antegrade LEFT CAROTID ARTERY: Similar scattered minor echogenic plaque formation. No hemodynamically significant left ICA stenosis, velocity elevation, or turbulent flow. LEFT VERTEBRAL ARTERY:  Antegrade IMPRESSION: Minor carotid atherosclerosis. No hemodynamically significant ICA stenosis. Degree of narrowing less than 50% bilaterally by ultrasound criteria. Patent antegrade vertebral flow bilaterally Electronically Signed   By: Jerilynn Mages.  Shick M.D.   On: 03/16/2019 10:47   ECHOCARDIOGRAM COMPLETE  Result Date: 03/16/2019   ECHOCARDIOGRAM REPORT   Patient Name:   BRYLEY KOVACEVIC Jolicoeur Date of Exam: 03/16/2019 Medical Rec #:  [id number]          Height:       72.0 in Accession #:    [id number]         Weight:       444.4 lb Date of Birth:  Feb 12, 1951  BSA:          2.99 m Patient Age:    69 years           BP:           127/66 mmHg Patient Gender: M                  HR:           63 bpm. Exam Location:  [facility] Procedure: 2D Echo, Cardiac Doppler and Color Doppler Indications:     Stroke 434.91  History:         Patient has no prior history of Echocardiogram examinations.                  COPD; Risk Factors:Hypertension and Diabetes. CKD.  Sonographer:     Sherrie Sport RDCS (AE) Referring Phys:  [id number] BRENDA MORRISON Diagnosing Phys: Bartholome Bill MD  Sonographer Comments: Technically difficult study due to poor echo windows. Image  acquisition challenging due to respiratory motion, Image acquisition challenging due to COPD and All views are modified, non- standard views. IMPRESSIONS  1. Left ventricular ejection fraction, by visual estimation, is 65 to 70%. The left ventricle has normal function. Left ventricular septal wall thickness was mildly increased. Mildly increased left ventricular posterior wall thickness. There is mildly increased left ventricular hypertrophy.  2. Left ventricular diastolic parameters are consistent with Grade I diastolic dysfunction (impaired relaxation).  3. The left ventricle has no regional wall motion abnormalities.  4. Global right ventricle was not well visualized.The right ventricular size is normal. No increase in right ventricular wall thickness.  5. Left atrial size was normal.  6. Right atrial size was normal.  7. The mitral valve was not well visualized. Trivial mitral valve regurgitation.  8. The tricuspid valve is not well visualized.  9. The aortic valve was not well visualized. Aortic valve regurgitation is not visualized. 10. The pulmonic valve was not well visualized. Pulmonic valve regurgitation is not visualized. 11. The aortic root was not well visualized. 12. The interatrial septum was not well visualized. FINDINGS  Left Ventricle: Left ventricular ejection fraction, by visual estimation, is 65 to 70%. The left ventricle has normal function. The left ventricle has no regional wall motion abnormalities. Mildly increased left ventricular posterior wall thickness. There is mildly increased left ventricular hypertrophy. Left ventricular diastolic parameters are consistent with Grade I diastolic dysfunction (impaired relaxation). Right Ventricle: The right ventricular size is normal. No increase in right ventricular wall thickness. Global RV systolic function is was not well visualized. Left Atrium: Left atrial size was normal in size. Right Atrium: Right atrial size was normal in size Pericardium:  There is no evidence of pericardial effusion. Mitral Valve: The mitral valve was not well visualized. Trivial mitral valve regurgitation. Tricuspid Valve: The tricuspid valve is not well visualized. Tricuspid valve regurgitation is mild. Aortic Valve: The aortic valve was not well visualized. Aortic valve regurgitation is not visualized. Pulmonic Valve: The pulmonic valve was not well visualized. Pulmonic valve regurgitation is not visualized. Pulmonic regurgitation is not visualized. Aorta: The aortic root was not well visualized. IAS/Shunts: The interatrial septum was not well visualized.  LEFT VENTRICLE PLAX 2D LVIDd:         4.59 cm LVIDs:         2.82 cm LV PW:         1.32 cm LV IVS:        1.16 cm LVOT diam:  2.10 cm LV SV:         67 ml LV SV Index:   20.11 LVOT Area:     3.46 cm  LEFT ATRIUM         Index LA diam:    3.60 cm 1.20 cm/m                        PULMONIC VALVE AORTA                 RVOT Peak grad: 2 mmHg Ao Root diam: 3.70 cm   SHUNTS Systemic Diam: 2.10 cm  Bartholome Bill MD Electronically signed by Bartholome Bill MD Signature Date/Time: 03/16/2019/12:48:59 PM    Final      Medications:   . dialysis solution 1.5% low-MG/low-CA     . amLODipine  10 mg Oral Daily  . aspirin EC  81 mg Oral Daily  . atorvastatin  80 mg Oral Daily  . calcium acetate  1,334 mg Oral TID WC  . calcium carbonate  1,250 mg Oral TID WC  . clopidogrel  75 mg Oral Daily  . epoetin (EPOGEN/PROCRIT) injection  10,000 Units Subcutaneous Weekly  . gabapentin  200 mg Oral BID  . gentamicin cream  1 application Topical Daily  . heparin  5,000 Units Subcutaneous Q8H  . insulin aspart  0-15 Units Subcutaneous TID WC  . levETIRAcetam  500 mg Oral BID  . levothyroxine  200 mcg Oral q morning - 10a  . lipase/protease/amylase  24,000 Units Oral TID  . losartan  100 mg Oral Daily  . metoprolol succinate  50 mg Oral Daily  . sodium bicarbonate  650 mg Oral BID     Assessment/ Plan:  69 y.o. male with end  stage renal disease on peritoneal dialysis, hypertension, diabetes mellitus type II, seizure disorder, nephrolithiasis, hypothyroidism, hyperlipidemia, depression, BPH, COPD  admitted on 03/12/2019 for Hypocalcemia [E83.51] Hypomagnesemia [E83.42] Prolonged Q-T interval on ECG [R94.31] Generalized weakness [R53.1]  [facility] 94kg CCPD 9 hours 5 exchanges 2568mL fills  Dry during daytime  #End-stage renal disease Patient reports missing couple of dialysis treatments prior to admission.  He states it was because he was feeling bad He also misses treatments regularly stating that his body can only handle so much dialysis At present, continue CCPD based on home regimen Discharge planning: If patient is sent to SNF/rehab, he will need to convert to hemodialysis with PermCath placement  #Secondary hyperparathyroidism with severe hypocalcemia Lab Results  Component Value Date   PTH 91 (H) 03/13/2019   CALCIUM 6.5 (L) 03/14/2019   PHOS 9.0 (H) 03/14/2019   High phosphorus and low calcium is noted Encourage compliance with binders-PhosLo Continue oral calcium with vitamin D  #Anemia of chronic kidney disease Lab Results  Component Value Date   HGB 8.3 (L) 03/14/2019  Continue Epogen subcu weekly (Saturday)  # Acute stroke Acute infarction of paramedian left mid brain Asa, atorvastatin, plavix    LOS: 3 Kyle [name] 1/18/20211:49 PM  [place], [place]  Note: This note was prepared with Dragon dictation. Any transcription errors are unintentional

## 2019-03-16 NOTE — Progress Notes (Signed)
PROGRESS NOTE  Kyle [name] UVO:[id number] DOB: 11-Jan-1951 DOA: 03/12/2019 PCP: Idelle Crouch, MD  HPI/Recap of past 57 hours: 69 year old male with past medical history of end-stage renal disease on peritoneal dialysis, COPD, hypertension and seizure disorder presented to the hospital on 1/14 with concern for abnormal labs.  He has been following with GI for issues with persistent diarrhea and bowel incontinence and on routine blood work was noted to have significant hypocalcemia with a calcium on admission that was less than 5.5.  Patient has had this in the past and takes Tums 3 times a day at home.  According to his wife, the patient does not sleep well so at times he will skip his home peritoneal dialysis.  There have been reports he is been having progressive weakness.  His wife is able to clarify that outside of the physical therapy at home health PT does, he does not move or do any exercises beyond that.  Following admission, patient confused fell out of bed hitting his head while in the ER when he attempted to get out of bed on his own.  He was evaluated and no signs of any head bleed or stroke by head CT.  He is noted to have left-sided ptosis which he says started after he fell and hit his head although this is not clear if this is the case.  MRI pending.  Following hospitalization, patient received peritoneal dialysis and electrolytes have improved although calcium is not yet normalized.  Patient has become more alert, but is still quite weak although he appears to be much more alert.  He does not listen when nursing tells him not to get up on his own, and attempts to do so, but is still so weak that he immediately falls back to bed.  In speaking with his wife, he is horribly deconditioned, even at home and outside of when home physical therapy was coming to his house, he does not really ambulate or do any exercise.  It was also noted the patient had left eyelid ptosis.  According to  the patient, he had gotten this when he he had fallen in the emergency room after trying to get up out of his bed without help.  MRI of the brain done 1/17 evening noted evidence of an acute left midbrain CVA.  Neurology notified and work-up initiated.  Dopplers and echocardiogram unremarkable, suspicious for small vessel disease.  A1c is pending, but was elevated last year.  Today, patient is still weak.  Tired.  Assessment/Plan: Principal Problem:   Hypocalcemia/hypophosphatemia/hypomagnesemia from secondary hyperparathyroidism in patient with end-stage renal disease on peritoneal dialysis: Nephrology consulted.  Complicated by noncompliance.  Has received peritoneal dialysis here.  According to nephrology, he is closer to baseline.  Calcium notes improvement. Active Problems:   Chronic obstructive pulmonary disease ([place]): Stable.  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  A1c is pending, but last year was elevated.  LDL at 48.  Carotid Dopplers and echo unrevealing.    HLD (hyperlipidemia)   Seizure ([place]): Stable.  Continue home medications.    Type 2 diabetes mellitus ([place]): CBGs overall stable.  Morbid obesity: Meets criteria BMI greater than 40    Prolonged QT interval   Essential hypertension: Blood pressures slightly on the higher end.  Managed by peritoneal dialysis and home medications.    Hypothyroidism: Continue Synthroid.    Physical deconditioning: No vexing issue.  The patient himself is not very motivated and has  become quite deconditioned even at home.  He is currently barely able to stand.  This is in part due to electrolyte abnormalities but also due to his refusal to participate in the exercises recommended by physical therapy.  His wife tells me that she is unable to care for him.  There are no skilled nursing facilities that will take him while he is on peritoneal dialysis.  Will need to discuss with patient options such as going to a nursing  facility and if so, he may need to convert to hemodialysis.  overweight: Meets criteria with BMI greater than 25.  Code Status: Full code   Family Communication: Updated wife by phone.  Disposition Plan: Unfortunately, this may be difficult.  OT recommending skilled nursing and awaiting eval by PT.  Unfortunately, no skilled nursing facility will take this patient because he on peritoneal dialysis.  Inpatient rehab does accept peritoneal dialysis patients, and will hopefully be willing to accept him.  Patient is quite deconditioned.  He does work with therapists, but his issue has been not doing exercises when on his own.   Consultants:  Nephrology  Neurology  Inpatient rehab  Procedures:  Peritoneal dialysis  Echocardiogram: Preserved ejection fraction.  Diastolic dysfunction.  Carotid Dopplers: No significant carotid artery stenosis.  Antimicrobials:  None  DVT prophylaxis: Heparin   Objective: Vitals:   03/16/19 0418 03/16/19 0749  BP: (!) 144/62 127/66  Pulse: 72 63  Resp: 20 18  Temp: 98 F (36.7 C) 98.4 F (36.9 C)  SpO2: 100% 98%    Intake/Output Summary (Last 24 hours) at 03/16/2019 1615 Last data filed at 03/16/2019 1002 Gross per 24 hour  Intake 240 ml  Output 718 ml  Net -478 ml   Filed Weights   03/12/19 1924 03/15/19 0530  Weight: 93.9 kg (!) 201.6 kg   Body mass index is 60.28 kg/m.  Exam:   General: Alert and oriented x2 or 3, no acute distress  HEENT: Normocephalic.  Left-sided ptosis.  Other cranial nerves appear intact.  Mucous membranes slightly dry.  Cardiovascular: Regular rate and rhythm, S1-S2  Respiratory: Clear to auscultation bilaterally  Abdomen: Soft, nontender, nondistended, positive bowel sounds  Musculoskeletal: No clubbing or cyanosis or edema  Skin: No skin breaks, tears or lesions  Psychiatry: Appropriate, no evidence of psychoses  Neuro: Persistent left-sided ptosis.  Other than generalized none focal  weakness, no other neurological deficits   Data Reviewed: CBC: Recent Labs  Lab 03/12/19 1947 03/13/19 0436 03/14/19 0623  WBC 8.1 7.2 5.6  NEUTROABS 6.0  --   --   HGB 9.0* 8.1* 8.3*  HCT 26.5* 24.0* 24.9*  MCV 95.7 96.0 96.5  PLT 209 183 614   Basic Metabolic Panel: Recent Labs  Lab 03/12/19 1947 03/13/19 0436 03/14/19 0623 03/16/19 1443  NA 135 135 137 141  K 3.8 3.9 3.6 3.5  CL 103 106 102 102  CO2 14* 17* 19* 24  GLUCOSE 136* 107* 233* 73  BUN 67* 66* 59* 50*  CREATININE 10.31* 10.37* 9.39* 9.22*  CALCIUM 5.6* 5.6* 6.5* 8.1*  MG 1.4*  --  1.8  --   PHOS 9.1*  --  9.0* 9.5*   GFR: Estimated Creatinine Clearance: 13.4 mL/min (A) (by C-G formula based on SCr of 9.22 mg/dL (H)). Liver Function Tests: Recent Labs  Lab 03/13/19 0436 03/14/19 0623 03/16/19 1443  AST 14* 14*  --   ALT 9 10  --   ALKPHOS 86 92  --  BILITOT 0.7 0.6  --   PROT 5.1* 5.2*  --   ALBUMIN 2.1* 2.1* 2.4*   No results for input(s): LIPASE, AMYLASE in the last 168 hours. No results for input(s): AMMONIA in the last 168 hours. Coagulation Profile: No results for input(s): INR, PROTIME in the last 168 hours. Cardiac Enzymes: No results for input(s): CKTOTAL, CKMB, CKMBINDEX, TROPONINI in the last 168 hours. BNP (last 3 results) No results for input(s): PROBNP in the last 8760 hours. HbA1C: No results for input(s): HGBA1C in the last 72 hours. CBG: Recent Labs  Lab 03/15/19 1203 03/15/19 1702 03/15/19 2111 03/16/19 0753 03/16/19 1202  GLUCAP 81 186* 176* 203* 244*   Lipid Profile: Recent Labs    03/16/19 0533  CHOL 130  HDL 41  LDLCALC 48  TRIG 207*  CHOLHDL 3.2   Thyroid Function Tests: No results for input(s): TSH, T4TOTAL, FREET4, T3FREE, THYROIDAB in the last 72 hours. Anemia Panel: No results for input(s): VITAMINB12, FOLATE, FERRITIN, TIBC, IRON, RETICCTPCT in the last 72 hours. Urine analysis:    Component Value Date/Time   COLORURINE YELLOW (A) 07/27/2016  1648   APPEARANCEUR CLEAR (A) 07/27/2016 1648   APPEARANCEUR Clear 08/08/2013 1947   LABSPEC 1.021 07/27/2016 1648   LABSPEC 1.012 08/08/2013 1947   PHURINE 5.0 07/27/2016 1648   GLUCOSEU >=500 (A) 07/27/2016 1648   GLUCOSEU 50 mg/dL 08/08/2013 1947   HGBUR SMALL (A) 07/27/2016 1648   BILIRUBINUR NEGATIVE 07/27/2016 1648   BILIRUBINUR Negative 08/08/2013 1947   KETONESUR NEGATIVE 07/27/2016 1648   PROTEINUR >=300 (A) 07/27/2016 1648   NITRITE NEGATIVE 07/27/2016 1648   LEUKOCYTESUR NEGATIVE 07/27/2016 1648   LEUKOCYTESUR Negative 08/08/2013 1947   Sepsis Labs: @LABRCNTIP (procalcitonin:4,lacticidven:4)  ) Recent Results (from the past 240 hour(s))  SARS CORONAVIRUS 2 (TAT 6-24 HRS) Nasopharyngeal Nasopharyngeal Swab     Status: None   Collection Time: 03/12/19  9:31 PM   Specimen: Nasopharyngeal Swab  Result Value Ref Range Status   SARS Coronavirus 2 NEGATIVE NEGATIVE Final    Comment: (NOTE) SARS-CoV-2 target nucleic acids are NOT DETECTED. The SARS-CoV-2 RNA is generally detectable in upper and lower respiratory specimens during the acute phase of infection. Negative results do not preclude SARS-CoV-2 infection, do not rule out co-infections with other pathogens, and should not be used as the sole basis for treatment or other patient management decisions. Negative results must be combined with clinical observations, patient history, and epidemiological information. The expected result is Negative. Fact Sheet for Patients: SugarRoll.be Fact Sheet for Healthcare Providers: https://www.woods-mathews.com/ This test is not yet approved or cleared by the [place] FDA and  has been authorized for detection and/or diagnosis of SARS-CoV-2 by FDA under an Emergency Use Authorization (EUA). This EUA will remain  in effect (meaning this test can be used) for the duration of the COVID-19 declaration under Section 56 4(b)(1) of the Act, 21  U.S.C. section 360bbb-3(b)(1), unless the authorization is terminated or revoked sooner. Performed at [facility], [place] [address] [place], [place] [address]       Studies: MR BRAIN WO CONTRAST  Result Date: 03/15/2019 CLINICAL DATA:  Renal failure patient.  Fell from the bed. EXAM: MRI HEAD WITHOUT CONTRAST TECHNIQUE: Multiplanar, multiecho pulse sequences of the brain and surrounding structures were obtained without intravenous contrast. COMPARISON:  Head CT 03/13/2019.  MRI 08/26/2013. FINDINGS: Brain: Diffusion imaging shows acute infarction of the left para median mid brain. No other acute infarction. There is an old infarction of  the left para median pons. Few old small vessel cerebellar infarctions. Cerebral hemispheres elsewhere show chronic small-vessel ischemic changes throughout the white matter. Old punctate lacunar infarction left thalamus. Old lacunar infarction of the white matter adjacent to the frontal horn of the right lateral ventricle. No large vessel territory stroke. No mass, hemorrhage, hydrocephalus or extra-axial collection. Vascular: Major vessels at the base of the brain show flow. Skull and upper cervical spine: Negative Sinuses/Orbits: Mucosal inflammation and opacification of the left maxillary sinus. T2 signal loss raises the possibility of fungal sinusitis versus inspissated mucus. Orbits negative. Other: None IMPRESSION: Acute infarction of the left paramedian mid brain. No hemorrhage or mass effect. 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 Chronic small-vessel ischemic changes elsewhere throughout the brain as outlined above. Opacification of the left maxillary sinus with T2 signal loss. This could be due to fungal sinusitis or chronic inspissated mucus. Electronically Signed   By: Nelson Chimes M.D.   On: 03/15/2019 17:58   US Carotid Bilateral  Result Date: 03/16/2019 CLINICAL DATA:  Hypertension, stroke symptoms, hyperlipidemia EXAM: BILATERAL CAROTID  DUPLEX ULTRASOUND TECHNIQUE: Pearline Cables scale imaging, color Doppler and duplex ultrasound were performed of bilateral carotid and vertebral arteries in the neck. COMPARISON:  None. FINDINGS: Criteria: Quantification of carotid stenosis is based on velocity parameters that correlate the residual internal carotid diameter with NASCET-based stenosis levels, using the diameter of the distal internal carotid lumen as the denominator for stenosis measurement. The following velocity measurements were obtained: RIGHT ICA: 108/28 cm/sec CCA: 30/07 cm/sec SYSTOLIC ICA/CCA RATIO:  1.6 ECA: 168 cm/sec LEFT ICA: 86/22 cm/sec CCA: 62/26 cm/sec SYSTOLIC ICA/CCA RATIO:  1.1 ECA: 131 cm/sec RIGHT CAROTID ARTERY: Minor echogenic shadowing plaque formation. No hemodynamically significant right ICA stenosis, velocity elevation, or turbulent flow. Degree of narrowing less than 50%. RIGHT VERTEBRAL ARTERY:  Antegrade LEFT CAROTID ARTERY: Similar scattered minor echogenic plaque formation. No hemodynamically significant left ICA stenosis, velocity elevation, or turbulent flow. LEFT VERTEBRAL ARTERY:  Antegrade IMPRESSION: Minor carotid atherosclerosis. No hemodynamically significant ICA stenosis. Degree of narrowing less than 50% bilaterally by ultrasound criteria. Patent antegrade vertebral flow bilaterally Electronically Signed   By: Jerilynn Mages.  Shick M.D.   On: 03/16/2019 10:47   ECHOCARDIOGRAM COMPLETE  Result Date: 03/16/2019   ECHOCARDIOGRAM REPORT   Patient Name:   Kyle [name] Date of Exam: 03/16/2019 Medical Rec #:  [id number]          Height:       72.0 in Accession #:    [id number]         Weight:       444.4 lb Date of Birth:  09/14/1950          BSA:          2.99 m Patient Age:    32 years           BP:           127/66 mmHg Patient Gender: M                  HR:           63 bpm. Exam Location:  [facility] Procedure: 2D Echo, Cardiac Doppler and Color Doppler Indications:     Stroke 434.91  History:         Patient has no prior  history of Echocardiogram examinations.                  COPD; Risk Factors:Hypertension and Diabetes.  CKD.  Sonographer:     Sherrie Sport RDCS (AE) Referring Phys:  [id number] [place] Diagnosing Phys: Bartholome Bill MD  Sonographer Comments: Technically difficult study due to poor echo windows. Image acquisition challenging due to respiratory motion, Image acquisition challenging due to COPD and All views are modified, non- standard views. IMPRESSIONS  1. Left ventricular ejection fraction, by visual estimation, is 65 to 70%. The left ventricle has normal function. Left ventricular septal wall thickness was mildly increased. Mildly increased left ventricular posterior wall thickness. There is mildly increased left ventricular hypertrophy.  2. Left ventricular diastolic parameters are consistent with Grade I diastolic dysfunction (impaired relaxation).  3. The left ventricle has no regional wall motion abnormalities.  4. Global right ventricle was not well visualized.The right ventricular size is normal. No increase in right ventricular wall thickness.  5. Left atrial size was normal.  6. Right atrial size was normal.  7. The mitral valve was not well visualized. Trivial mitral valve regurgitation.  8. The tricuspid valve is not well visualized.  9. The aortic valve was not well visualized. Aortic valve regurgitation is not visualized. 10. The pulmonic valve was not well visualized. Pulmonic valve regurgitation is not visualized. 11. The aortic root was not well visualized. 12. The interatrial septum was not well visualized. FINDINGS  Left Ventricle: Left ventricular ejection fraction, by visual estimation, is 65 to 70%. The left ventricle has normal function. The left ventricle has no regional wall motion abnormalities. Mildly increased left ventricular posterior wall thickness. There is mildly increased left ventricular hypertrophy. Left ventricular diastolic parameters are consistent with Grade I diastolic  dysfunction (impaired relaxation). Right Ventricle: The right ventricular size is normal. No increase in right ventricular wall thickness. Global RV systolic function is was not well visualized. Left Atrium: Left atrial size was normal in size. Right Atrium: Right atrial size was normal in size Pericardium: There is no evidence of pericardial effusion. Mitral Valve: The mitral valve was not well visualized. Trivial mitral valve regurgitation. Tricuspid Valve: The tricuspid valve is not well visualized. Tricuspid valve regurgitation is mild. Aortic Valve: The aortic valve was not well visualized. Aortic valve regurgitation is not visualized. Pulmonic Valve: The pulmonic valve was not well visualized. Pulmonic valve regurgitation is not visualized. Pulmonic regurgitation is not visualized. Aorta: The aortic root was not well visualized. IAS/Shunts: The interatrial septum was not well visualized.  LEFT VENTRICLE PLAX 2D LVIDd:         4.59 cm LVIDs:         2.82 cm LV PW:         1.32 cm LV IVS:        1.16 cm LVOT diam:     2.10 cm LV SV:         67 ml LV SV Index:   20.11 LVOT Area:     3.46 cm  LEFT ATRIUM         Index LA diam:    3.60 cm 1.20 cm/m                        PULMONIC VALVE AORTA                 RVOT Peak grad: 2 mmHg Ao Root diam: 3.70 cm   SHUNTS Systemic Diam: 2.10 cm  Bartholome Bill MD Electronically signed by Bartholome Bill MD Signature Date/Time: 03/16/2019/12:48:59 PM    Final     Scheduled Meds:  amLODipine  10 mg Oral Daily   aspirin EC  81 mg Oral Daily   atorvastatin  80 mg Oral Daily   calcium acetate  1,334 mg Oral TID WC   calcium carbonate  1,250 mg Oral TID WC   clopidogrel  75 mg Oral Daily   epoetin (EPOGEN/PROCRIT) injection  10,000 Units Subcutaneous Weekly   gabapentin  200 mg Oral BID   gentamicin cream  1 application Topical Daily   heparin  5,000 Units Subcutaneous Q8H   insulin aspart  0-15 Units Subcutaneous TID WC   levETIRAcetam  500 mg Oral BID    levothyroxine  200 mcg Oral q morning - 10a   lipase/protease/amylase  24,000 Units Oral TID   losartan  100 mg Oral Daily   metoprolol succinate  50 mg Oral Daily   sodium bicarbonate  650 mg Oral BID    Continuous Infusions:  dialysis solution 1.5% low-MG/low-CA       LOS: 3 days     Annita Brod, MD [facility]  To reach me or the doctor on call, go to: [email or web address] Password [facility]  03/16/2019, 4:15 PM

## 2019-03-16 NOTE — Progress Notes (Signed)
   03/16/19 0845  Completion  Effluent Appearance Clear;Yellow  Exit Site Care Performed No (Comment)  Treatment Status Complete  Fluid Balance - CCPD  Total Output for Exchanges (mL) 718 ml  Procedure Comments  Tolerated treatment well? Yes  TOLERATED TX WELL NO ISSUES

## 2019-03-16 NOTE — Progress Notes (Signed)
*  PRELIMINARY RESULTS* Echocardiogram 2D Echocardiogram has been performed.  Kyle [name] 03/16/2019, 11:49 AM

## 2019-03-16 NOTE — Evaluation (Addendum)
Clinical/Bedside Swallow Evaluation Patient Details  Name: Kyle [name] MRN: [id number] Date of Birth: May 29, 1950  Today's Date: 03/16/2019 Time: SLP Start Time (ACUTE ONLY): 1155 SLP Stop Time (ACUTE ONLY): 1250 SLP Time Calculation (min) (ACUTE ONLY): 55 min  Past Medical History:  Past Medical History:  Diagnosis Date  . Anxiety   . BPH (benign prostatic hyperplasia)   . Chronic kidney disease   . COPD (chronic obstructive pulmonary disease) ([place])   . Depression   . Diabetes mellitus without complication ([place])   . Hyperlipidemia   . Hypertension   . Hypothyroidism   . Nephrolithiasis   . Seizures ([place])    Past Surgical History:  Past Surgical History:  Procedure Laterality Date  . COLONOSCOPY    . COLONOSCOPY WITH PROPOFOL N/A 05/30/2015   Procedure: COLONOSCOPY WITH PROPOFOL;  Surgeon: Manya Silvas, MD;  Location: [facility] ENDOSCOPY;  Service: Endoscopy;  Laterality: N/A;  . COLONOSCOPY WITH PROPOFOL N/A 12/01/2018   Procedure: COLONOSCOPY WITH PROPOFOL;  Surgeon: Toledo, [name], MD;  Location: [facility] ENDOSCOPY;  Service: Gastroenterology;  Laterality: N/A;  . kidney stone    . THYROIDECTOMY    . VARICOCELE EXCISION    . VIDEO BRONCHOSCOPY Bilateral 05/25/2016   Procedure: VIDEO BRONCHOSCOPY WITHOUT FLUORO;  Surgeon: Juanito Doom, MD;  Location: [facility] ENDOSCOPY;  Service: Cardiopulmonary;  Laterality: Bilateral;   HPI:  Kyle [name] admitted for hypocalcemia. Kyle [name] has PMH to include Multiple medical issues including ESRD on peritoneal dialysis, COPD, HTN, and seizures, Tobacco and ETOH use, Lung R Nodule, collapse of R lung. Kyle [name] currently receiving HHPT, however has been declining physically and reports multiple falls. 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 Chronic small-vessel ischemic changes elsewhere throughout the brain.  Prior to admission per chart notes, Patient reports that he has  been feeling very lightheaded and weak for about the past 24 hours.  He had an episode earlier today where he was eating at a restaurant and suddenly felt very shaky, " like I was going to have a seizure".  He reports his last seizure being over 5 years ago and did not end up having a seizure episode today, states he has been compliant with his Keppra.  He had an appointment with his GI doctor later in the day for chronic diarrhea and had lab work performed at that time.  He then received a call this evening that his Calcium was critically low.  Per NSG report this AM, Kyle [name] was coughing when trying to swallow Pills.   Assessment / Plan / Recommendation Clinical Impression  Kyle [name] appears to present w/ oropharyngeal phase dysphagia w/ po trials at this evaluation today; min+ congested breathing at baseline -- on ERSD Peritoneal Dialysis 2x daily currently(he has missed some dialysis prior to admission per MD notes). Kyle [name] has also been exhibiting increased Confusion but is much improved at this hour w/ SLP per NSG. He is at increased risk for aspiration d/t overall medical status thus Pulmonary decline. Kyle [name] was positioned upright and assisted w/ oral intake/trials during evaluation. Kyle [name] consumed trials of thin liquids w/ overt clinical s/s of aspiration noted; wet vocal quality post trial, velopharyngeal incompetency noted. During trials of Nectar consistency liquids, purees, and softened solids, Kyle [name] exhibited improved pharyngeal swallowing presentation w/ only intermittent multiple swallows observed during/post trials. Velopharyngeal incompetency noted. Kyle [name] exhibited min decreased oral control w/ boluses; reduced labial closure and lingual/oral  coordination w/ bolus manipulation. Kyle [name] followed each trial w/ lingual sweeping and f/u swallow to complete oral clearing. Noted min+ decreased labial/facial tone on R side; no anterior leakage or pocketing however. Min+ Dysarthria present; intermittent word-finding but  conversation/engagement improved since this AM per NSG. Congested breathing noted Prior to assessment did NOT increase during/post po trials. NSG present and agreed. Kyle [name] helped to feed self; UEs shaky. Of note, per SLP's guidance, Kyle [name] swallowed 1 Pill at a time w/ sip of Nectar liquid feeding self and tolerated task well per NSG/SLP.  Recommend a dysphagia level 2 (minced foods) at this time; Nectar consistency liquids via Cup; aspiration precautions; Pills w/ Nectar liquids or a Puree. Monitoring at meals; tray setup and positioning upright. ST services will f/u w/ objective swallow assessment next 1-2 days in order to safely upgrade diet.  SLP Visit Diagnosis: Dysphagia, oropharyngeal phase (R13.12)    Aspiration Risk  Mild aspiration risk;Moderate aspiration risk;Risk for inadequate nutrition/hydration    Diet Recommendation  Dysphagia level 2 (minced foods w/ gravies); Nectar liquids. Aspiration precautions; tray setup and support and monitoring at meals.   Medication Administration: Whole meds with puree(or w/ Nectar liquids via cup)    Other  Recommendations Recommended Consults: (Dietician f/u) Oral Care Recommendations: Oral care BID;Oral care before and after PO;Staff/trained caregiver to provide oral care Other Recommendations: Order thickener from pharmacy;Prohibited food (jello, ice cream, thin soups);Remove water pitcher;Have oral suction available   Follow up Recommendations Skilled Nursing facility(TBD)      Frequency and Duration min 3x week  2 weeks       Prognosis Prognosis for Safe Diet Advancement: Fair Barriers to Reach Goals: Severity of deficits      Swallow Study   General Date of Onset: 03/12/19 HPI: Kyle [name] admitted for hypocalcemia. Kyle [name] has PMH to include Multiple medical issues including ESRD on peritoneal dialysis, COPD, HTN, and seizures, Tobacco and ETOH use, Lung R Nodule, collapse of R lung. Kyle [name] currently receiving HHPT, however has been declining physically and  reports multiple falls. 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 Chronic small-vessel ischemic changes elsewhere throughout the brain.  Prior to admission per chart notes, Patient reports that he has been feeling very lightheaded and weak for about the past 24 hours.  He had an episode earlier today where he was eating at a restaurant and suddenly felt very shaky, " like I was going to have a seizure".  He reports his last seizure being over 5 years ago and did not end up having a seizure episode today, states he has been compliant with his Keppra.  He had an appointment with his GI doctor later in the day for chronic diarrhea and had lab work performed at that time.  He then received a call this evening that his Calcium was critically low.  Per NSG report this AM, Kyle [name] was coughing when trying to swallow Pills. Type of Study: Bedside Swallow Evaluation Previous Swallow Assessment: none Diet Prior to this Study: Regular;Thin liquids Temperature Spikes Noted: No(wbc 5.6) Respiratory Status: Room air History of Recent Intubation: No Behavior/Cognition: Alert;Cooperative;Pleasant mood;Distractible;Requires cueing Oral Cavity Assessment: Dry Oral Care Completed by SLP: Yes Oral Cavity - Dentition: Adequate natural dentition Vision: Functional for self-feeding Self-Feeding Abilities: Able to feed self;Needs assist;Needs set up Patient Positioning: Upright in bed(needed full positioning) Baseline Vocal Quality: Normal(mild+ dysarthria ) Volitional Cough: Strong;Congested Volitional Swallow: Able to  elicit    Oral/Motor/Sensory Function Overall Oral Motor/Sensory Function: Mild impairment Facial ROM: Reduced right Facial Symmetry: Abnormal symmetry right Facial Strength: Reduced right Lingual ROM: Reduced right(slight intermittently) Lingual Symmetry: Within Functional Limits(grossly) Lingual Strength:  Reduced(slight on R) Velum: Suspected CN X (Vagus) dysfunction Mandible: Within Functional Limits   Ice Chips Ice chips: Impaired Presentation: Spoon(fed; 3 trials) Oral Phase Impairments: Reduced lingual movement/coordination(min, intermittent) Oral Phase Functional Implications: Prolonged oral transit(x1) Pharyngeal Phase Impairments: (none) Other Comments: noted min velopharyngeal incompetency w/ swallows   Thin Liquid Thin Liquid: Impaired Presentation: Cup;Self Fed(supported; 1 trial) Oral Phase Impairments: Poor awareness of bolus Oral Phase Functional Implications: (poor control) Pharyngeal  Phase Impairments: Suspected delayed Swallow;Multiple swallows;Wet Vocal Quality Other Comments: noted min velopharyngeal incompetency w/ swallows    Nectar Thick Nectar Thick Liquid: Impaired(min) Presentation: Cup;Self Fed(~8ozs) Oral Phase Impairments: Reduced labial seal;Reduced lingual movement/coordination(min) Oral phase functional implications: (grossly adequate) Pharyngeal Phase Impairments: Multiple swallows(intermittently) Other Comments: noted min velopharyngeal incompetency w/ swallows   Honey Thick Honey Thick Liquid: Not tested   Puree Puree: Impaired(min) Presentation: Self Fed;Spoon(~6 ozs total) Oral Phase Impairments: Reduced labial seal;Reduced lingual movement/coordination(min) Oral Phase Functional Implications: (grossly adequate) Pharyngeal Phase Impairments: Multiple swallows(intermittently) Other Comments: noted min velopharyngeal incompetency w/ swallows   Solid     Solid: Impaired(min) Presentation: Self Fed(5 trials) Oral Phase Impairments: Reduced lingual movement/coordination(min) Oral Phase Functional Implications: Prolonged oral transit(min) Pharyngeal Phase Impairments: Multiple swallows(intermittently)       Orinda Kenner, MS, CCC-SLP Yarelly Kuba 03/16/2019,4:24 PM

## 2019-03-16 NOTE — Consult Note (Signed)
Referring Physician: [place] Pink    Chief Complaint: Left ptosis  HPI: Kyle [name] is an 69 y.o. 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  Patient has been following with GI for persistent diarrhea and bowel incontinence. He had routine blood work done and noted to have significant hypocalcemia and was advised by GI to come to ED on 10/14.  Patient had not been fully compliant with dialysis and had multiple other metabolic abnormalities as well.  Complained of weakness and confusion.  While in the ED had a fall but noted to have normal neurological examination after that time.  Head CT on 1/15 showed no acute changes.  Correction of metabolic abnormalities was initiated.  On 1/17 per nursing notes was noted to have facial asymmetry.  F/U MRI of the brain was abnormal revealing an acute left midbrain infarct.  Initial NIHSS of 1.     Date last known well: 03/14/2019 Time last known well: Time: 19:10 tPA Given: No: Outside time window  Past Medical History:  Diagnosis Date  . Anxiety   . BPH (benign prostatic hyperplasia)   . Chronic kidney disease   . COPD (chronic obstructive pulmonary disease) ([place])   . Depression   . Diabetes mellitus without complication ([place])   . Hyperlipidemia   . Hypertension   . Hypothyroidism   . Nephrolithiasis   . Seizures ([place])     Past Surgical History:  Procedure Laterality Date  . COLONOSCOPY    . COLONOSCOPY WITH PROPOFOL N/A 05/30/2015   Procedure: COLONOSCOPY WITH PROPOFOL;  Surgeon: Manya Silvas, MD;  Location: [facility] ENDOSCOPY;  Service: Endoscopy;  Laterality: N/A;  . COLONOSCOPY WITH PROPOFOL N/A 12/01/2018   Procedure: COLONOSCOPY WITH PROPOFOL;  Surgeon: Toledo, [name], MD;  Location: [facility] ENDOSCOPY;  Service: Gastroenterology;  Laterality: N/A;  . kidney stone    . THYROIDECTOMY    . VARICOCELE EXCISION    . VIDEO BRONCHOSCOPY Bilateral  05/25/2016   Procedure: VIDEO BRONCHOSCOPY WITHOUT FLUORO;  Surgeon: Juanito Doom, MD;  Location: [facility] ENDOSCOPY;  Service: Cardiopulmonary;  Laterality: Bilateral;    Family History  Problem Relation Age of Onset  . Diabetes Mother   . Diabetes Maternal Grandmother   . Diabetes Maternal Grandfather   . Lung cancer Father   . Emphysema Paternal Grandfather    Social History:  reports that he has been smoking cigarettes. He has a 40.00 pack-year smoking history. He has never used smokeless tobacco. He reports current alcohol use of about 8.0 standard drinks of alcohol per week. He reports that he does not use drugs.  Allergies: No Known Allergies  Medications:  I have reviewed the patient's current medications. Prior to Admission:  Medications Prior to Admission  Medication Sig Dispense Refill Last Dose  . atorvastatin (LIPITOR) 10 MG tablet Take 10 mg by mouth daily.   Past Week at Unknown time  . furosemide (LASIX) 80 MG tablet Take 80 mg by mouth daily.   Past Week at Unknown time  . gabapentin (NEURONTIN) 100 MG capsule TAKE 1 CAPSULE BY MOUTH TWICE A DAY FOR ONE WEEK, THEN INCREASE TO 2 CAPSULES TWICE A DAY AND CONTINUE AS DIRECTED   Past Week at Unknown time  . glipiZIDE (GLUCOTROL XL) 10 MG 24 hr tablet Take 10 mg by mouth daily.   Past Week at Unknown time  . HYDROcodone-acetaminophen (NORCO/VICODIN) 5-325 MG tablet Take 1 tablet by mouth every  4 (four) hours as needed for moderate pain.   prn at prn  . insulin glargine (LANTUS) 100 unit/mL SOPN Inject 20 Units into the skin daily.   Past Week at Unknown time  . insulin lispro (HUMALOG) 100 UNIT/ML KwikPen Inject 4-14 Units into the skin daily. Sliding scale   Past Week at Unknown time  . levETIRAcetam (KEPPRA) 500 MG tablet Take 500 mg by mouth 2 (two) times a day.   Past Week at Unknown time  . levothyroxine (SYNTHROID) 200 MCG tablet Take 200 mcg by mouth every morning.   Past Week at Unknown time  . losartan (COZAAR) 100 MG  tablet Take 100 mg by mouth daily.   Past Week at Unknown time  . metoprolol succinate (TOPROL-XL) 50 MG 24 hr tablet Take 1 tablet by mouth daily.   Past Week at Unknown time  . Pancrelipase, Lip-Prot-Amyl, 24000-76000 units CPEP Take 1 capsule by mouth 3 (three) times daily.   Past Week at Unknown time  . Semaglutide,0.25 or 0.5MG /DOS, 2 MG/1.5ML SOPN Inject 0.5 mg into the skin once a week.   Past Week at Unknown time  . traZODone (DESYREL) 50 MG tablet Take 100 mg by mouth at bedtime.   Past Week at Unknown time  . Amylase-Lipase-Protease (CREON 10 PO) Take by mouth daily as needed.   Not Taking at Unknown time  . calcium carbonate (TUMS) 500 MG chewable tablet Chew 1 tablet (200 mg of elemental calcium total) by mouth 3 (three) times daily with meals. (Patient not taking: Reported on 03/12/2019) 120 tablet 3 Not Taking at Unknown time  . escitalopram (LEXAPRO) 10 MG tablet Take 10 mg by mouth daily.   Not Taking at Unknown time  . potassium chloride SA (K-DUR) 20 MEQ tablet Take 1 tablet (20 mEq total) by mouth 2 (two) times a day. (Patient not taking: Reported on 03/12/2019) 90 tablet 0 Not Taking at Unknown time   Scheduled: . amLODipine  10 mg Oral Daily  . aspirin EC  81 mg Oral Daily  . atorvastatin  80 mg Oral Daily  . calcium acetate  1,334 mg Oral TID WC  . calcium carbonate  1,250 mg Oral TID WC  . clopidogrel  75 mg Oral Daily  . epoetin (EPOGEN/PROCRIT) injection  10,000 Units Subcutaneous Weekly  . gabapentin  200 mg Oral BID  . gentamicin cream  1 application Topical Daily  . heparin  5,000 Units Subcutaneous Q8H  . insulin aspart  0-15 Units Subcutaneous TID WC  . levETIRAcetam  500 mg Oral BID  . levothyroxine  200 mcg Oral q morning - 10a  . lipase/protease/amylase  24,000 Units Oral TID  . losartan  100 mg Oral Daily  . metoprolol succinate  50 mg Oral Daily  . sodium bicarbonate  650 mg Oral BID    ROS: History obtained from the patient  General ROS: negative for  - chills, fatigue, fever, night sweats, weight gain or weight loss Psychological ROS: confusion Ophthalmic ROS: negative for - blurry vision, double vision, eye pain or loss of vision ENT ROS: ptosis Hematological and Lymphatic ROS: negative for - bleeding problems, bruising or swollen lymph nodes Endocrine ROS: negative for - galactorrhea, hair pattern changes, polydipsia/polyuria or temperature intolerance Respiratory ROS: negative for - cough, hemoptysis, shortness of breath or wheezing Cardiovascular ROS: negative for - chest pain, dyspnea on exertion, edema or irregular heartbeat Gastrointestinal ROS: diarrhea, stool incontinence Genito-Urinary ROS: negative for - dysuria, hematuria, incontinence or urinary frequency/urgency  Musculoskeletal ROS: weakness Neurological ROS: as noted in HPI Dermatological ROS: negative for rash and skin lesion changes  Physical Examination: Blood pressure 127/66, pulse 63, temperature 98.4 F (36.9 C), temperature source Oral, resp. rate 18, height 6' (1.829 m), weight (!) 201.6 kg, SpO2 98 %.  HEENT-  Normocephalic, no lesions, without obvious abnormality.  Normal external eye and conjunctiva.  Normal TM's bilaterally.  Normal auditory canals and external ears. Normal external nose, mucus membranes and septum.  Normal pharynx. Cardiovascular- S1, S2 normal, pulses palpable throughout   Lungs- chest clear, no wheezing, rales, normal symmetric air entry Abdomen- soft, non-tender; bowel sounds normal; no masses,  no organomegaly Extremities- no edema Lymph-no adenopathy palpable Musculoskeletal-no joint tenderness, deformity or swelling Skin-warm and dry, no hyperpigmentation, vitiligo, or suspicious lesions  Neurological Examination   Mental Status: Alert and awake.  Speech fluent without evidence of aphasia.  Requires reinforcement to follow 3 step commands  Cranial Nerves: II: Discs flat bilaterally; Visual fields grossly normal, pupils unequal with  right being larger than left.  Both round, reactive to light and accommodation III,IV, VI: left ptosis, left CNIII palsy noted V,VII: smile symmetric, facial light touch sensation normal bilaterally VIII: hearing normal bilaterally IX,X: gag reflex present XI: bilateral shoulder shrug XII: midline tongue extension Motor: Right : Upper extremity   5/5    Left:     Upper extremity   5/5  Lower extremity   5/5     Lower extremity   5/5 Tone and bulk:normal tone throughout; no atrophy noted Sensory: Pinprick and light touch intact throughout, bilaterally Deep Tendon Reflexes: Symmetric throughout Plantars: Right: mute   Left: mute Cerebellar: Difficulty with finger to nose testing using the RUE.  Dysmetria noted with heel to shin testing using the RLE.   Gait: not tested due to safety concerns    Laboratory Studies:  Basic Metabolic Panel: Recent Labs  Lab 03/12/19 1947 03/13/19 0436 03/14/19 0623  NA 135 135 137  K 3.8 3.9 3.6  CL 103 106 102  CO2 14* 17* 19*  GLUCOSE 136* 107* 233*  BUN 67* 66* 59*  CREATININE 10.31* 10.37* 9.39*  CALCIUM 5.6* 5.6* 6.5*  MG 1.4*  --  1.8  PHOS 9.1*  --  9.0*    Liver Function Tests: Recent Labs  Lab 03/13/19 0436 03/14/19 0623  AST 14* 14*  ALT 9 10  ALKPHOS 86 92  BILITOT 0.7 0.6  PROT 5.1* 5.2*  ALBUMIN 2.1* 2.1*   No results for input(s): LIPASE, AMYLASE in the last 168 hours. No results for input(s): AMMONIA in the last 168 hours.  CBC: Recent Labs  Lab 03/12/19 1947 03/13/19 0436 03/14/19 0623  WBC 8.1 7.2 5.6  NEUTROABS 6.0  --   --   HGB 9.0* 8.1* 8.3*  HCT 26.5* 24.0* 24.9*  MCV 95.7 96.0 96.5  PLT 209 183 178    Cardiac Enzymes: No results for input(s): CKTOTAL, CKMB, CKMBINDEX, TROPONINI in the last 168 hours.  BNP: Invalid input(s): POCBNP  CBG: Recent Labs  Lab 03/15/19 0844 03/15/19 1203 03/15/19 1702 03/15/19 2111 03/16/19 0753  GLUCAP 216* 81 186* 176* 203*    Microbiology: Results for  orders placed or performed during the hospital encounter of 03/12/19  SARS CORONAVIRUS 2 (TAT 6-24 HRS) Nasopharyngeal Nasopharyngeal Swab     Status: None   Collection Time: 03/12/19  9:31 PM   Specimen: Nasopharyngeal Swab  Result Value Ref Range Status   SARS Coronavirus 2 NEGATIVE NEGATIVE  Final    Comment: (NOTE) SARS-CoV-2 target nucleic acids are NOT DETECTED. The SARS-CoV-2 RNA is generally detectable in upper and lower respiratory specimens during the acute phase of infection. Negative results do not preclude SARS-CoV-2 infection, do not rule out co-infections with other pathogens, and should not be used as the sole basis for treatment or other patient management decisions. Negative results must be combined with clinical observations, patient history, and epidemiological information. The expected result is Negative. Fact Sheet for Patients: SugarRoll.be Fact Sheet for Healthcare Providers: https://www.woods-mathews.com/ This test is not yet approved or cleared by the [place] FDA and  has been authorized for detection and/or diagnosis of SARS-CoV-2 by FDA under an Emergency Use Authorization (EUA). This EUA will remain  in effect (meaning this test can be used) for the duration of the COVID-19 declaration under Section 56 4(b)(1) of the Act, 21 U.S.C. section 360bbb-3(b)(1), unless the authorization is terminated or revoked sooner. Performed at [facility], [place] [address] [place], [place] [address]     Coagulation Studies: No results for input(s): LABPROT, INR in the last 72 hours.  Urinalysis: No results for input(s): COLORURINE, LABSPEC, PHURINE, GLUCOSEU, HGBUR, BILIRUBINUR, KETONESUR, PROTEINUR, UROBILINOGEN, NITRITE, LEUKOCYTESUR in the last 168 hours.  Invalid input(s): APPERANCEUR  Lipid Panel:    Component Value Date/Time   CHOL 130 03/16/2019 0533   TRIG 207 (H) 03/16/2019 0533   HDL 41 03/16/2019 0533    CHOLHDL 3.2 03/16/2019 0533   VLDL 41 (H) 03/16/2019 0533   LDLCALC 48 03/16/2019 0533    HgbA1C:  Lab Results  Component Value Date   HGBA1C 10.0 (H) 09/16/2018    Urine Drug Screen:  No results found for: LABOPIA, COCAINSCRNUR, LABBENZ, AMPHETMU, THCU, LABBARB  Alcohol Level: No results for input(s): ETH in the last 168 hours.  Other results: EKG: sinus rhythm at 86 bpm.  Imaging: MR BRAIN WO CONTRAST  Result Date: 03/15/2019 CLINICAL DATA:  Renal failure patient.  Fell from the bed. EXAM: MRI HEAD WITHOUT CONTRAST TECHNIQUE: Multiplanar, multiecho pulse sequences of the brain and surrounding structures were obtained without intravenous contrast. COMPARISON:  Head CT 03/13/2019.  MRI 08/26/2013. FINDINGS: Brain: Diffusion imaging shows acute infarction of the left para median mid brain. No other acute infarction. There is an old infarction of the left para median pons. Few old small vessel cerebellar infarctions. Cerebral hemispheres elsewhere show chronic small-vessel ischemic changes throughout the white matter. Old punctate lacunar infarction left thalamus. Old lacunar infarction of the white matter adjacent to the frontal horn of the right lateral ventricle. No large vessel territory stroke. No mass, hemorrhage, hydrocephalus or extra-axial collection. Vascular: Major vessels at the base of the brain show flow. Skull and upper cervical spine: Negative Sinuses/Orbits: Mucosal inflammation and opacification of the left maxillary sinus. T2 signal loss raises the possibility of fungal sinusitis versus inspissated mucus. Orbits negative. Other: None IMPRESSION: Acute infarction of the left paramedian mid brain. No hemorrhage or mass effect. 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 Chronic small-vessel ischemic changes elsewhere throughout the brain as outlined above. Opacification of the left maxillary sinus with T2 signal loss. This could be due to fungal sinusitis or chronic inspissated  mucus. Electronically Signed   By: Nelson Chimes M.D.   On: 03/15/2019 17:58    Assessment: 69 y.o. 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  During hospitalization was noted to have a  left ptosis and abnormal EOMs on the left consistent with a third cranial nerve palsy.  Some right sided dysmetria noted on neurological examination as well.  MRI of the brain reviewed and reveals a left acute paramedian infarct.  Etiology likely small vessel disease.  Chronic small vessel disease noted otherwise.  Patient on Lipitor but no antiplatelet therapy prior to admission.  Echocardiogram and carotid dopplers are pending.  LDL 48.    Stroke Risk Factors - diabetes mellitus, hyperlipidemia, hypertension and smoking  Plan: 1. HgbA1c 2. Continue statin 3. PT consult, OT consult, Speech consult 4. Echocardiogram pending 5. Carotid dopplers pending 6. Prophylactic therapy-Dual antiplatelet therapy with ASA 81mg  and Plavix 75mg  for three weeks with change to ASA 81mg  daily alone as monotherapy after that time. 7. NPO until RN stroke swallow screen 8. Telemetry monitoring 9. Frequent neuro checks 10. Smoking cessation counseling  Alexis Goodell, MD Neurology [phone] 03/16/2019, 9:57 AM

## 2019-03-16 NOTE — Care Management Important Message (Signed)
Important Message  Patient Details  Name: Kyle [name] MRN: [id number] Date of Birth: February 13, 1951   Medicare Important Message Given:  Yes     Dannette Barbara 03/16/2019, 12:06 PM

## 2019-03-16 NOTE — Progress Notes (Signed)
Physical Therapy Treatment Patient Details Name: Kyle [name] MRN: [id number] DOB: 1950-05-23 Today's Date: 03/16/2019    History of Present Illness Pt is a 69 yo male admitted for hypocalcemia. History includes ESRD on peritoneal dialysis, COPD, HTN, and seizures. Pt currently receiving HHPT, however has been declining physically and reports multiple falls. Of note: pt with new onset ptosis, MRI revealed acute L midbrain infarct.    PT Comments    Pt motivated and actively participated throughout the session.  Pt was able to demonstrate fair to good functional strength but struggled more with ataxia and stability.  Pt presented with R lateral and/or posterior instability during seated and standing activities. During forwards/backwards and sidestepping gait training pt often required +2 Mod A to prevent LOB.  Pt frequency updated to 7x/wk to reflect diagnosis of CVA. Pt is at a very high risk for falls and would not be safe to return to his prior living situation at this time. Pt will benefit from PT services in a SNF setting upon discharge to safely address deficits listed in patient problem list for decreased caregiver assistance and eventual return to PLOF.   Follow Up Recommendations  SNF     Equipment Recommendations  None recommended by PT    Recommendations for Other Services       Precautions / Restrictions Precautions Precautions: Fall Restrictions Weight Bearing Restrictions: No    Mobility  Bed Mobility Overal bed mobility: Needs Assistance Bed Mobility: Supine to Sit;Sit to Supine     Supine to sit: Supervision Sit to supine: Supervision   General bed mobility comments: Min verbal cues for sequencing and positioning but no physical assistance needed  Transfers Overall transfer level: Needs assistance Equipment used: Rolling walker (2 wheeled) Transfers: Sit to/from Stand Sit to Stand: Mod assist;+2 physical assistance         General transfer  comment: Pt with R lateral and posterior lean in standing requiring Mod A to correct, slight buckling of knees that the pt self-corrects  Ambulation/Gait Ambulation/Gait assistance: Mod assist;+2 physical assistance Gait Distance (Feet): 10 Feet x 2 Assistive device: Rolling walker (2 wheeled) Gait Pattern/deviations: Step-through pattern;Decreased step length - right;Decreased step length - left;Ataxic Gait velocity: decreased   General Gait Details: Pt with R lateral and posterior lean during amb with max verbal and tactile cues to encourage pt to assist with correction but frequently required +2 Mod A to correct   Stairs             Wheelchair Mobility    Modified Rankin (Stroke Patients Only)       Balance Overall balance assessment: Needs assistance;History of Falls Sitting-balance support: Feet supported Sitting balance-Leahy Scale: Poor Sitting balance - Comments: R lateral and posterior leaning Postural control: Posterior lean;Right lateral lean Standing balance support: Bilateral upper extremity supported Standing balance-Leahy Scale: Poor Standing balance comment: R lateral and posterior leaning                            Cognition Arousal/Alertness: Awake/alert Behavior During Therapy: WFL for tasks assessed/performed Overall Cognitive Status: Within Functional Limits for tasks assessed                                 General Comments: pt A&O, but demos lack of insight into deficits, some impulsivity, requires firm verbal commands for fxl mobility safety.  Exercises Total Joint Exercises Ankle Circles/Pumps: AROM;Strengthening;Both;10 reps Quad Sets: Strengthening;Both;10 reps Heel Slides: Strengthening;Both;10 reps Hip ABduction/ADduction: Strengthening;Both;10 reps Straight Leg Raises: Strengthening;10 reps;Both Long Arc Quad: Strengthening;Both;10 reps Knee Flexion: Strengthening;Both;10 reps Marching in Standing:  AROM;Both;5 reps;10 reps;Standing Other Exercises Other Exercises: Anterior and L lateral weight shifting activities in sittinga and standing Other Exercises: OT facilitates education re: safety with fxl mobility including safe use of FWW-reach back to sit. Pt demos moderate reception of education.    General Comments        Pertinent Vitals/Pain Pain Assessment: No/denies pain    Home Living Family/patient expects to be discharged to:: Private residence Living Arrangements: Spouse/significant other Available Help at Discharge: Family Type of Home: House Home Access: Stairs to enter Entrance Stairs-Rails: Can reach both   Home Equipment: Environmental consultant - 2 wheels;Cane - single point;Walker - standard      Prior Function Level of Independence: Needs assistance  Gait / Transfers Assistance Needed: Pt reports that he does not use walkers, states he typically uses SPC for fxl mobility for household distances. ADL's / Homemaking Assistance Needed: Pt states he was able to perfrom BADLs, states that he was driving. States that wife primarily performs cooking/cleaning.     PT Goals (current goals can now be found in the care plan section) Acute Rehab PT Goals Patient Stated Goal: to get better and go home Progress towards PT goals: Progressing toward goals    Frequency    7X/week      PT Plan Frequency needs to be updated    Co-evaluation              AM-PAC PT "6 Clicks" Mobility   Outcome Measure  Help needed turning from your back to your side while in a flat bed without using bedrails?: A Little Help needed moving from lying on your back to sitting on the side of a flat bed without using bedrails?: A Little Help needed moving to and from a bed to a chair (including a wheelchair)?: A Lot Help needed standing up from a chair using your arms (e.g., wheelchair or bedside chair)?: A Lot Help needed to walk in hospital room?: A Lot Help needed climbing 3-5 steps with a  railing? : Total 6 Click Score: 13    End of Session Equipment Utilized During Treatment: Gait belt Activity Tolerance: Patient tolerated treatment well Patient left: in bed;with bed alarm set;with call bell/phone within reach Nurse Communication: Mobility status PT Visit Diagnosis: Unsteadiness on feet (R26.81);Repeated falls (R29.6);Muscle weakness (generalized) (M62.81);History of falling (Z91.81);Difficulty in walking, not elsewhere classified (R26.2)     Time: 4401-0272 PT Time Calculation (min) (ACUTE ONLY): 25 min  Charges:  $Therapeutic Exercise: 8-22 mins $Therapeutic Activity: 8-22 mins                     D. Scott Deaveon Schoen PT, DPT 03/16/19, 4:52 PM

## 2019-03-16 NOTE — Progress Notes (Signed)
Pt maintained on Peritoneal Dialysis; machine error alarm; call placed to [employer] service [phone], spoke to [facility] who guided RN to clear error code and restart machine. Shelly informed RN if error code appeared again, machine would need to be replaced. Pt resting comfortably, PD continued.

## 2019-03-16 NOTE — Evaluation (Signed)
Occupational Therapy Evaluation Patient Details Name: Kyle [name] MRN: [id number] DOB: 10-25-1950 Today's Date: 03/16/2019    History of Present Illness Pt admitted for hypocalcemia. History includes ESRD on peritoneal dialysis, COPD, HTN, and seizures. Pt currently receiving HHPT, however has been declining physically and reports multiple falls. Of note: pt with new onset ptosis, MRI revealed acute L midbrain infarct.   Clinical Impression   Pt was seen for OT evaluation this date. Prior to hospital admission, pt reports being Indep with ADLs, performing fxl mobility with no AD or with [facility], states he was driving, states his wife primarily does cooking/cleaning. Pt lives in home with spouse with 6 STE. Currently pt demonstrates impairments as described below (See OT problem list) which functionally limit his ability to perform ADL/self-care tasks. Pt currently requires MIN/MOD A with ADL transfers and fxl mobility as well as MOD A for LB ADLs, and MIN A to setup with UB ADLs.  Pt would benefit from skilled OT to address noted impairments and functional limitations (see below for any additional details) in order to maximize safety and independence while minimizing falls risk and caregiver burden.  Upon hospital discharge, recommend pt discharge to SNF to increase pt safety with ADL mobility and self care independence to highest attainable level.    Follow Up Recommendations  SNF    Equipment Recommendations  Other (comment)(defer to next level of care)    Recommendations for Other Services       Precautions / Restrictions Precautions Precautions: Fall Restrictions Weight Bearing Restrictions: No      Mobility Bed Mobility Overal bed mobility: Needs Assistance Bed Mobility: Supine to Sit;Sit to Supine     Supine to sit: Min guard Sit to supine: Min assist      Transfers Overall transfer level: Needs assistance Equipment used: Rolling walker (2 wheeled) Transfers: Sit  to/from Stand Sit to Stand: Mod assist         General transfer comment: Demos R lateral lean and posterior lean in standing, slight buckling of knees, some slight instances of LOB backward requiring correction    Balance Overall balance assessment: Needs assistance;History of Falls Sitting-balance support: Feet supported Sitting balance-Leahy Scale: Poor Sitting balance - Comments: R side leaning   Standing balance support: Bilateral upper extremity supported Standing balance-Leahy Scale: Poor Standing balance comment: R leaning                           ADL either performed or assessed with clinical judgement   ADL Overall ADL's : Needs assistance/impaired Eating/Feeding: Set up;Bed level Eating/Feeding Details (indicate cue type and reason): HOB elevated ~60-70 degrees. Grooming: Wash/dry hands;Set up;Sitting           Upper Body Dressing : Minimal assistance;Sitting   Lower Body Dressing: Moderate assistance Lower Body Dressing Details (indicate cue type and reason): MOD A to thread b/l socks, pt with poor dynamic sitting balance requiring assist for seated balance as well for LB dressing tasks. Toilet Transfer: Minimal assistance;Moderate assistance;Ambulation;RW;BSC   Toileting- Clothing Manipulation and Hygiene: Moderate assistance;+2 for physical assistance;+2 for safety/equipment;Sit to/from stand       Functional mobility during ADLs: Minimal assistance;Moderate assistance;Rolling walker(pt noted to have most of weight back in heels, demos posterior lean. Requires MOD tactile/verbal cues for safety/awareness of foot placement (demos narrow stance, near scissoring with fxl mobility). Pt demos little attention to cues.)       Vision   Additional Comments: pt  with new onset ptosis of L eye. CT on 1/15 with no infarct, but MRI completed after new onset ptosis (1/17 at 1753 showed acute infarct of paramedian midbrain), difficult to formally assess  tracking bilaterally d/t unable to hold L eye open without assist.     Perception     Praxis      Pertinent Vitals/Pain Pain Assessment: No/denies pain     Hand Dominance Right   Extremity/Trunk Assessment Upper Extremity Assessment Upper Extremity Assessment: RUE deficits/detail;LUE deficits/detail RUE Deficits / Details: shld, elbow, grip 4-/5, dysdiadokinesia detected-R hand does not move in sync with L. requires increased time to oppose all fingertips RUE Coordination: decreased fine motor(coordination) LUE Deficits / Details: shld, elbow, grip 4+/5   Lower Extremity Assessment Lower Extremity Assessment: Defer to PT evaluation;Generalized weakness       Communication Communication Communication: No difficulties   Cognition Arousal/Alertness: Awake/alert Behavior During Therapy: Impulsive Overall Cognitive Status: Within Functional Limits for tasks assessed                                 General Comments: pt A&O, but demos lack of insight into deficits, some impulsivity, requires firm verbal commands for fxl mobility safety.   General Comments       Exercises Other Exercises Other Exercises: OT Facilitates education re: role of OT. Pt verbalized understanding Other Exercises: OT facilitates education re: safety with fxl mobility including safe use of FWW-reach back to sit. Pt demos moderate reception of education.   Shoulder Instructions      Home Living Family/patient expects to be discharged to:: Private residence Living Arrangements: Spouse/significant other Available Help at Discharge: Family Type of Home: House Home Access: Stairs to enter [employer] of Steps: 6 Entrance Stairs-Rails: Can reach both                 Home Equipment: Alice - 2 wheels;Cane - single point;Walker - standard          Prior Functioning/Environment Level of Independence: Needs assistance  Gait / Transfers Assistance Needed: Pt reports that  he does not use walkers, states he typically uses SPC for fxl mobility for household distances. ADL's / Homemaking Assistance Needed: Pt states he was able to perfrom BADLs, states that he was driving. States that wife primarily performs cooking/cleaning.            OT Problem List: Decreased strength;Decreased activity tolerance;Impaired balance (sitting and/or standing);Impaired vision/perception;Decreased coordination;Decreased safety awareness      OT Treatment/Interventions: Self-care/ADL training;Therapeutic exercise;Energy conservation;DME and/or AE instruction;Therapeutic activities;Patient/family education;Balance training    OT Goals(Current goals can be found in the care plan section) Acute Rehab OT Goals Patient Stated Goal: to get better and go home OT Goal Formulation: With patient Time For Goal Achievement: 03/30/19 Potential to Achieve Goals: Good  OT Frequency: Min 1X/week   Barriers to D/C:            Co-evaluation              AM-PAC OT "6 Clicks" Daily Activity     Outcome Measure Help from another person eating meals?: None Help from another person taking care of personal grooming?: A Little Help from another person toileting, which includes using toliet, bedpan, or urinal?: A Lot Help from another person bathing (including washing, rinsing, drying)?: A Lot Help from another person to put on and taking off regular upper body clothing?: A Little Help  from another person to put on and taking off regular lower body clothing?: A Lot 6 Click Score: 16   End of Session Equipment Utilized During Treatment: Gait belt;Rolling walker  Activity Tolerance: Patient tolerated treatment well Patient left: in bed;with call bell/phone within reach;with bed alarm set;Other (comment)(with BST pulled up to eat lunch)  OT Visit Diagnosis: Unsteadiness on feet (R26.81);History of falling (Z91.81)                Time: 1550-2714 OT Time Calculation (min): 24  min Charges:  OT General Charges $OT Visit: 1 Visit OT Evaluation $OT Eval Moderate Complexity: 1 Mod OT Treatments $Self Care/Home Management : 8-22 mins  Gerrianne Scale, MS, OTR/L ascom [phone] 03/16/19, 3:14 PM

## 2019-03-17 ENCOUNTER — Inpatient Hospital Stay: Payer: Medicare PPO

## 2019-03-17 DIAGNOSIS — I739 Peripheral vascular disease, unspecified: Secondary | ICD-10-CM | POA: Diagnosis present

## 2019-03-17 LAB — GLUCOSE, CAPILLARY
Glucose-Capillary: 196 mg/dL — ABNORMAL HIGH (ref 70–99)
Glucose-Capillary: 207 mg/dL — ABNORMAL HIGH (ref 70–99)
Glucose-Capillary: 148 mg/dL — ABNORMAL HIGH (ref 70–99)
Glucose-Capillary: 156 mg/dL — ABNORMAL HIGH (ref 70–99)

## 2019-03-17 LAB — HEMOGLOBIN A1C
Hgb A1c MFr Bld: 7.1 % — ABNORMAL HIGH (ref 4.8–5.6)
Mean Plasma Glucose: 157.07 mg/dL

## 2019-03-17 NOTE — Progress Notes (Signed)
Subjective: No new neurological complaints.   Objective: Current vital signs: BP (!) 140/59 (BP Location: Left Arm)   Pulse 66   Temp 97.8 F (36.6 C)   Resp 20   Ht 6' (1.829 m)   Wt (!) 201.6 kg   SpO2 100%   BMI 60.28 kg/m  Vital signs in last 24 hours: Temp:  [97.6 F (36.4 C)-98.2 F (36.8 C)] 97.8 F (36.6 C) (01/19 0801) Pulse Rate:  [58-69] 66 (01/19 0958) Resp:  [20] 20 (01/19 0801) BP: (134-144)/(59-73) 140/59 (01/19 0801) SpO2:  [98 %-100 %] 100 % (01/19 0801)  Intake/Output from previous day: 01/18 0701 - 01/19 0700 In: 240 [P.O.:240] Out: 718  Intake/Output this shift: Total I/O In: 120 [P.O.:120] Out: 0  Nutritional status:  Diet Order            Diet renal with fluid restriction Fluid restriction: 1200 mL Fluid; Room service appropriate? Yes with Assist; Fluid consistency: Nectar Thick  Diet effective now              Neurologic Exam: Mental Status: Alert and awake.  Speech fluent without evidence of aphasia.  Requires reinforcement to follow 3 step commands  Cranial Nerves: II: Discs flat bilaterally; Visual fields grossly normal, pupils unequal with right being larger than left.  Both round, reactive to light and accommodation III,IV, VI: left ptosis, left CNIII palsy noted V,VII: smile symmetric, facial light touch sensation normal bilaterally VIII: hearing normal bilaterally IX,X: gag reflex present XI: bilateral shoulder shrug XII: midline tongue extension Motor: 5/5 throughout Sensory: Pinprick and light touch intact throughout, bilaterally   Lab Results: Basic Metabolic Panel: Recent Labs  Lab 03/12/19 1947 03/12/19 1947 03/13/19 0436 03/14/19 0623 03/16/19 1443  NA 135  --  135 137 141  K 3.8  --  3.9 3.6 3.5  CL 103  --  106 102 102  CO2 14*  --  17* 19* 24  GLUCOSE 136*  --  107* 233* 73  BUN 67*  --  66* 59* 50*  CREATININE 10.31*  --  10.37* 9.39* 9.22*  CALCIUM 5.6*   < > 5.6* 6.5* 8.1*  MG 1.4*  --   --  1.8  --    PHOS 9.1*  --   --  9.0* 9.5*   < > = values in this interval not displayed.    Liver Function Tests: Recent Labs  Lab 03/13/19 0436 03/14/19 0623 03/16/19 1443  AST 14* 14*  --   ALT 9 10  --   ALKPHOS 86 92  --   BILITOT 0.7 0.6  --   PROT 5.1* 5.2*  --   ALBUMIN 2.1* 2.1* 2.4*   No results for input(s): LIPASE, AMYLASE in the last 168 hours. No results for input(s): AMMONIA in the last 168 hours.  CBC: Recent Labs  Lab 03/12/19 1947 03/13/19 0436 03/14/19 0623  WBC 8.1 7.2 5.6  NEUTROABS 6.0  --   --   HGB 9.0* 8.1* 8.3*  HCT 26.5* 24.0* 24.9*  MCV 95.7 96.0 96.5  PLT 209 183 178    Cardiac Enzymes: No results for input(s): CKTOTAL, CKMB, CKMBINDEX, TROPONINI in the last 168 hours.  Lipid Panel: Recent Labs  Lab 03/16/19 0533  CHOL 130  TRIG 207*  HDL 41  CHOLHDL 3.2  VLDL 41*  LDLCALC 48    CBG: Recent Labs  Lab 03/16/19 0753 03/16/19 1202 03/16/19 1632 03/16/19 2031 03/17/19 0800  GLUCAP 203* 244* 117* 187*  196*    Microbiology: Results for orders placed or performed during the hospital encounter of 03/12/19  SARS CORONAVIRUS 2 (TAT 6-24 HRS) Nasopharyngeal Nasopharyngeal Swab     Status: None   Collection Time: 03/12/19  9:31 PM   Specimen: Nasopharyngeal Swab  Result Value Ref Range Status   SARS Coronavirus 2 NEGATIVE NEGATIVE Final    Comment: (NOTE) SARS-CoV-2 target nucleic acids are NOT DETECTED. The SARS-CoV-2 RNA is generally detectable in upper and lower respiratory specimens during the acute phase of infection. Negative results do not preclude SARS-CoV-2 infection, do not rule out co-infections with other pathogens, and should not be used as the sole basis for treatment or other patient management decisions. Negative results must be combined with clinical observations, patient history, and epidemiological information. The expected result is Negative. Fact Sheet for Patients: SugarRoll.be Fact  Sheet for Healthcare Providers: https://www.woods-mathews.com/ This test is not yet approved or cleared by the [place] FDA and  has been authorized for detection and/or diagnosis of SARS-CoV-2 by FDA under an Emergency Use Authorization (EUA). This EUA will remain  in effect (meaning this test can be used) for the duration of the COVID-19 declaration under Section 56 4(b)(1) of the Act, 21 U.S.C. section 360bbb-3(b)(1), unless the authorization is terminated or revoked sooner. Performed at [facility], [place] [address], [place], [place] [address]     Coagulation Studies: No results for input(s): LABPROT, INR in the last 72 hours.  Imaging: MR BRAIN WO CONTRAST  Result Date: 03/15/2019 CLINICAL DATA:  Renal failure patient.  Fell from the bed. EXAM: MRI HEAD WITHOUT CONTRAST TECHNIQUE: Multiplanar, multiecho pulse sequences of the brain and surrounding structures were obtained without intravenous contrast. COMPARISON:  Head CT 03/13/2019.  MRI 08/26/2013. FINDINGS: Brain: Diffusion imaging shows acute infarction of the left para median mid brain. No other acute infarction. There is an old infarction of the left para median pons. Few old small vessel cerebellar infarctions. Cerebral hemispheres elsewhere show chronic small-vessel ischemic changes throughout the white matter. Old punctate lacunar infarction left thalamus. Old lacunar infarction of the white matter adjacent to the frontal horn of the right lateral ventricle. No large vessel territory stroke. No mass, hemorrhage, hydrocephalus or extra-axial collection. Vascular: Major vessels at the base of the brain show flow. Skull and upper cervical spine: Negative Sinuses/Orbits: Mucosal inflammation and opacification of the left maxillary sinus. T2 signal loss raises the possibility of fungal sinusitis versus inspissated mucus. Orbits negative. Other: None IMPRESSION: Acute infarction of the left paramedian mid brain. No  hemorrhage or mass effect. 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 Chronic small-vessel ischemic changes elsewhere throughout the brain as outlined above. Opacification of the left maxillary sinus with T2 signal loss. This could be due to fungal sinusitis or chronic inspissated mucus. Electronically Signed   By: Nelson Chimes M.D.   On: 03/15/2019 17:58   US Carotid Bilateral  Result Date: 03/16/2019 CLINICAL DATA:  Hypertension, stroke symptoms, hyperlipidemia EXAM: BILATERAL CAROTID DUPLEX ULTRASOUND TECHNIQUE: Pearline Cables scale imaging, color Doppler and duplex ultrasound were performed of bilateral carotid and vertebral arteries in the neck. COMPARISON:  None. FINDINGS: Criteria: Quantification of carotid stenosis is based on velocity parameters that correlate the residual internal carotid diameter with NASCET-based stenosis levels, using the diameter of the distal internal carotid lumen as the denominator for stenosis measurement. The following velocity measurements were obtained: RIGHT ICA: 108/28 cm/sec CCA: 25/85 cm/sec SYSTOLIC ICA/CCA RATIO:  1.6 ECA: 168 cm/sec LEFT ICA:  86/22 cm/sec CCA: 23/53 cm/sec SYSTOLIC ICA/CCA RATIO:  1.1 ECA: 131 cm/sec RIGHT CAROTID ARTERY: Minor echogenic shadowing plaque formation. No hemodynamically significant right ICA stenosis, velocity elevation, or turbulent flow. Degree of narrowing less than 50%. RIGHT VERTEBRAL ARTERY:  Antegrade LEFT CAROTID ARTERY: Similar scattered minor echogenic plaque formation. No hemodynamically significant left ICA stenosis, velocity elevation, or turbulent flow. LEFT VERTEBRAL ARTERY:  Antegrade IMPRESSION: Minor carotid atherosclerosis. No hemodynamically significant ICA stenosis. Degree of narrowing less than 50% bilaterally by ultrasound criteria. Patent antegrade vertebral flow bilaterally Electronically Signed   By: Jerilynn Mages.  Shick M.D.   On: 03/16/2019 10:47   ECHOCARDIOGRAM COMPLETE  Result Date: 03/16/2019   ECHOCARDIOGRAM REPORT    Patient Name:   MCDONALD REILING [place] Date of Exam: 03/16/2019 Medical Rec #:  [id number]          Height:       72.0 in Accession #:    [id number]         Weight:       444.4 lb Date of Birth:  1951-02-16          BSA:          2.99 m Patient Age:    28 years           BP:           127/66 mmHg Patient Gender: M                  HR:           63 bpm. Exam Location:  [facility] Procedure: 2D Echo, Cardiac Doppler and Color Doppler Indications:     Stroke 434.91  History:         Patient has no prior history of Echocardiogram examinations.                  COPD; Risk Factors:Hypertension and Diabetes. CKD.  Sonographer:     Sherrie Sport RDCS (AE) Referring Phys:  [id number] BRENDA MORRISON Diagnosing Phys: Bartholome Bill MD  Sonographer Comments: Technically difficult study due to poor echo windows. Image acquisition challenging due to respiratory motion, Image acquisition challenging due to COPD and All views are modified, non- standard views. IMPRESSIONS  1. Left ventricular ejection fraction, by visual estimation, is 65 to 70%. The left ventricle has normal function. Left ventricular septal wall thickness was mildly increased. Mildly increased left ventricular posterior wall thickness. There is mildly increased left ventricular hypertrophy.  2. Left ventricular diastolic parameters are consistent with Grade I diastolic dysfunction (impaired relaxation).  3. The left ventricle has no regional wall motion abnormalities.  4. Global right ventricle was not well visualized.The right ventricular size is normal. No increase in right ventricular wall thickness.  5. Left atrial size was normal.  6. Right atrial size was normal.  7. The mitral valve was not well visualized. Trivial mitral valve regurgitation.  8. The tricuspid valve is not well visualized.  9. The aortic valve was not well visualized. Aortic valve regurgitation is not visualized. 10. The pulmonic valve was not well visualized. Pulmonic valve regurgitation is not visualized.  11. The aortic root was not well visualized. 12. The interatrial septum was not well visualized. FINDINGS  Left Ventricle: Left ventricular ejection fraction, by visual estimation, is 65 to 70%. The left ventricle has normal function. The left ventricle has no regional wall motion abnormalities. Mildly increased left ventricular posterior wall thickness. There is mildly increased left ventricular hypertrophy. Left ventricular diastolic parameters are  consistent with Grade I diastolic dysfunction (impaired relaxation). Right Ventricle: The right ventricular size is normal. No increase in right ventricular wall thickness. Global RV systolic function is was not well visualized. Left Atrium: Left atrial size was normal in size. Right Atrium: Right atrial size was normal in size Pericardium: There is no evidence of pericardial effusion. Mitral Valve: The mitral valve was not well visualized. Trivial mitral valve regurgitation. Tricuspid Valve: The tricuspid valve is not well visualized. Tricuspid valve regurgitation is mild. Aortic Valve: The aortic valve was not well visualized. Aortic valve regurgitation is not visualized. Pulmonic Valve: The pulmonic valve was not well visualized. Pulmonic valve regurgitation is not visualized. Pulmonic regurgitation is not visualized. Aorta: The aortic root was not well visualized. IAS/Shunts: The interatrial septum was not well visualized.  LEFT VENTRICLE PLAX 2D LVIDd:         4.59 cm LVIDs:         2.82 cm LV PW:         1.32 cm LV IVS:        1.16 cm LVOT diam:     2.10 cm LV SV:         67 ml LV SV Index:   20.11 LVOT Area:     3.46 cm  LEFT ATRIUM         Index LA diam:    3.60 cm 1.20 cm/m                        PULMONIC VALVE AORTA                 RVOT Peak grad: 2 mmHg Ao Root diam: 3.70 cm   SHUNTS Systemic Diam: 2.10 cm  Bartholome Bill MD Electronically signed by Bartholome Bill MD Signature Date/Time: 03/16/2019/12:48:59 PM    Final     Medications:  I have reviewed the  patient's current medications. Scheduled: . amLODipine  10 mg Oral Daily  . aspirin EC  81 mg Oral Daily  . atorvastatin  80 mg Oral Daily  . calcium acetate  1,334 mg Oral TID WC  . calcium carbonate  1,250 mg Oral TID WC  . clopidogrel  75 mg Oral Daily  . epoetin (EPOGEN/PROCRIT) injection  10,000 Units Subcutaneous Weekly  . gabapentin  200 mg Oral BID  . gentamicin cream  1 application Topical Daily  . heparin  5,000 Units Subcutaneous Q8H  . insulin aspart  0-15 Units Subcutaneous TID WC  . levETIRAcetam  500 mg Oral BID  . levothyroxine  200 mcg Oral q morning - 10a  . lipase/protease/amylase  24,000 Units Oral TID  . losartan  100 mg Oral Daily  . metoprolol succinate  50 mg Oral Daily  . sodium bicarbonate  650 mg Oral BID    Assessment/Plan: 69 y.o. male with medical history significant ofseizure, ESRD on nightly PD, copd, hypertension who presented with concerns of abnormal labs.  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  Some right sided dysmetria noted on neurological examination as well.  MRI of the brain revealed an acute left paramedian midbrain infarct.  Etiology likely small vessel disease.  Patient on Lipitor but no antiplatelet therapy prior to admission.   Carotid dopplers show no evidence of hemodynamically significant stenosis.  Echocardiogram shows no cardiac source of emboli with an EF of 65-70%.  LDL 48.  A1c  7.1.  BP controlled.  Recommendations: 1. Blood sugar management with target A1c<7.0 2. Continue statin 3. PT consult, OT consult, Speech consult 4. Prophylactic therapy-Dual antiplatelet therapy with ASA 81mg  and Plavix 75mg  for three weeks with change to ASA 81mg  daily alone as monotherapy after that time. 5. Telemetry monitoring 6. Frequent neuro checks 7. Smoking cessation counseling    LOS: 4 days   Alexis Goodell, MD Neurology [phone] 03/17/2019   11:15 AM

## 2019-03-17 NOTE — Progress Notes (Signed)
Patient more slurred, unable to keep right arm straight up. Notified MD, CT head ordered

## 2019-03-17 NOTE — Progress Notes (Signed)
PROGRESS NOTE  Kyle [name] HDQ:[id number] DOB: 05/14/1950 DOA: 03/12/2019 PCP: Idelle Crouch, MD  HPI/Recap of past 49 hours: 69 year old male with past medical history of end-stage renal disease on peritoneal dialysis, COPD, hypertension and seizure disorder presented to the hospital on 1/14 with concern for abnormal labs.  He has been following with GI for issues with persistent diarrhea and bowel incontinence and on routine blood work was noted to have significant hypocalcemia with a calcium on admission that was less than 5.5.  Patient has had this in the past and takes Tums 3 times a day at home.  According to his wife, the patient does not sleep well so at times he will skip his home peritoneal dialysis.  There have been reports he is been having progressive weakness.  His wife is able to clarify that outside of the physical therapy at home health PT does, he does not move or do any exercises beyond that.  Following admission, patient confused fell out of bed hitting his head while in the ER when he attempted to get out of bed on his own.  He was evaluated and no signs of any head bleed or stroke by head CT.  He is noted to have left-sided ptosis which he says started after he fell and hit his head although this is not clear if this is the case.  MRI pending.  Following hospitalization, patient received peritoneal dialysis and electrolytes have improved although calcium is not yet normalized.  Patient has become more alert, but is still quite weak although he appears to be much more alert.  He does not listen when nursing tells him not to get up on his own, and attempts to do so, but is still so weak that he immediately falls back to bed.  In speaking with his wife, he is horribly deconditioned, even at home and outside of when home physical therapy was coming to his house, he does not really ambulate or do any exercise.  It was also noted the patient had left eyelid ptosis.  According to  the patient, he had gotten this when he he had fallen in the emergency room after trying to get up out of his bed without help.  MRI of the brain done 1/17 evening noted evidence of an acute left midbrain CVA.  Neurology notified and work-up initiated.  Dopplers and echocardiogram unremarkable, suspicious for small vessel disease.    PT/OT seen for potential evaluation by inpatient rehab.  Patient doing okay, tired, no other complaints.  Assessment/Plan: Principal Problem:   Hypocalcemia/hypophosphatemia/hypomagnesemia from secondary hyperparathyroidism in patient with end-stage renal disease on peritoneal dialysis: Nephrology consulted.  Complicated by noncompliance.  Has received peritoneal dialysis here.  According to nephrology, he is closer to baseline.  Calcium notes improvement. Active Problems:   Chronic obstructive pulmonary disease ([place]): Stable.  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  A1c at 7.1.  LDL at 48.  Carotid Dopplers and echo unrevealing.  Followed up by neurology.  Patient on aspirin and Plavix.  (Can stop Plavix in 3 weeks)    HLD (hyperlipidemia): On statin.    Seizure ([place]): Stable.  Continue home medications.    Type 2 diabetes mellitus ([place]): CBGs overall stable.  Morbid obesity: Meets criteria BMI greater than 40    Prolonged QT interval   Essential hypertension: Blood pressures slightly on the higher end.  Managed by peritoneal dialysis and home medications.    Hypothyroidism: Continue Synthroid.  Physical deconditioning: No vexing issue.  The patient himself is not very motivated and has become quite deconditioned even at home.  He is currently barely able to stand.  This is in part due to electrolyte abnormalities but also due to his refusal to participate in the exercises recommended by physical therapy.  His wife tells me that she is unable to care for him.  There are no skilled nursing facilities that will take him while he is  on peritoneal dialysis.  Patient unwilling to convert to hemodialysis.  However, he may be a candidate for inpatient rehab who can take patient on peritoneal dialysis.  Waiting for PT and OT evaluations.  overweight: Meets criteria with BMI greater than 25.  Code Status: Full code   Family Communication: Updated wife by phone.  Disposition Plan: PT and OT evaluations, hopefully patient will be candidate for inpatient rehab and can be approved.  If he is approved, can go once bed available.   Consultants:  Nephrology  Neurology  Inpatient rehab  Procedures:  Peritoneal dialysis  Echocardiogram: Preserved ejection fraction.  Diastolic dysfunction.  Carotid Dopplers: No significant carotid artery stenosis.  Antimicrobials:  None  DVT prophylaxis: Heparin   Objective: Vitals:   03/17/19 0958 03/17/19 1517  BP:  137/68  Pulse: 66 62  Resp:  18  Temp:  (!) 97.5 F (36.4 C)  SpO2:  100%    Intake/Output Summary (Last 24 hours) at 03/17/2019 1522 Last data filed at 03/17/2019 1005 Gross per 24 hour  Intake 120 ml  Output 0 ml  Net 120 ml   Filed Weights   03/12/19 1924 03/15/19 0530  Weight: 93.9 kg (!) 201.6 kg   Body mass index is 60.28 kg/m.  Exam:   General: Alert and oriented x2 or 3, no acute distress  HEENT: Normocephalic.  Left-sided ptosis.  Other cranial nerves appear intact.  Mucous membranes slightly dry.  Cardiovascular: Regular rate and rhythm, S1-S2  Respiratory: Clear to auscultation bilaterally  Abdomen: Soft, nontender, nondistended, positive bowel sounds  Musculoskeletal: No clubbing or cyanosis or edema  Skin: No skin breaks, tears or lesions  Psychiatry: Appropriate, no evidence of psychoses  Neuro: Persistent left-sided ptosis.  Other than generalized none focal weakness, no other neurological deficits   Data Reviewed: CBC: Recent Labs  Lab 03/12/19 1947 03/13/19 0436 03/14/19 0623  WBC 8.1 7.2 5.6  NEUTROABS 6.0   --   --   HGB 9.0* 8.1* 8.3*  HCT 26.5* 24.0* 24.9*  MCV 95.7 96.0 96.5  PLT 209 183 944   Basic Metabolic Panel: Recent Labs  Lab 03/12/19 1947 03/13/19 0436 03/14/19 0623 03/16/19 1443  NA 135 135 137 141  K 3.8 3.9 3.6 3.5  CL 103 106 102 102  CO2 14* 17* 19* 24  GLUCOSE 136* 107* 233* 73  BUN 67* 66* 59* 50*  CREATININE 10.31* 10.37* 9.39* 9.22*  CALCIUM 5.6* 5.6* 6.5* 8.1*  MG 1.4*  --  1.8  --   PHOS 9.1*  --  9.0* 9.5*   GFR: Estimated Creatinine Clearance: 13.4 mL/min (A) (by C-G formula based on SCr of 9.22 mg/dL (H)). Liver Function Tests: Recent Labs  Lab 03/13/19 0436 03/14/19 0623 03/16/19 1443  AST 14* 14*  --   ALT 9 10  --   ALKPHOS 86 92  --   BILITOT 0.7 0.6  --   PROT 5.1* 5.2*  --   ALBUMIN 2.1* 2.1* 2.4*   No results for input(s): LIPASE,  AMYLASE in the last 168 hours. No results for input(s): AMMONIA in the last 168 hours. Coagulation Profile: No results for input(s): INR, PROTIME in the last 168 hours. Cardiac Enzymes: No results for input(s): CKTOTAL, CKMB, CKMBINDEX, TROPONINI in the last 168 hours. BNP (last 3 results) No results for input(s): PROBNP in the last 8760 hours. HbA1C: Recent Labs    03/17/19 0600  HGBA1C 7.1*   CBG: Recent Labs  Lab 03/16/19 1202 03/16/19 1632 03/16/19 2031 03/17/19 0800 03/17/19 1159  GLUCAP 244* 117* 187* 196* 207*   Lipid Profile: Recent Labs    03/16/19 0533  CHOL 130  HDL 41  LDLCALC 48  TRIG 207*  CHOLHDL 3.2   Thyroid Function Tests: No results for input(s): TSH, T4TOTAL, FREET4, T3FREE, THYROIDAB in the last 72 hours. Anemia Panel: No results for input(s): VITAMINB12, FOLATE, FERRITIN, TIBC, IRON, RETICCTPCT in the last 72 hours. Urine analysis:    Component Value Date/Time   COLORURINE YELLOW (A) 07/27/2016 1648   APPEARANCEUR CLEAR (A) 07/27/2016 1648   APPEARANCEUR Clear 08/08/2013 1947   LABSPEC 1.021 07/27/2016 1648   LABSPEC 1.012 08/08/2013 1947   PHURINE 5.0  07/27/2016 1648   GLUCOSEU >=500 (A) 07/27/2016 1648   GLUCOSEU 50 mg/dL 08/08/2013 1947   HGBUR SMALL (A) 07/27/2016 1648   BILIRUBINUR NEGATIVE 07/27/2016 1648   BILIRUBINUR Negative 08/08/2013 1947   KETONESUR NEGATIVE 07/27/2016 1648   PROTEINUR >=300 (A) 07/27/2016 1648   NITRITE NEGATIVE 07/27/2016 1648   LEUKOCYTESUR NEGATIVE 07/27/2016 1648   LEUKOCYTESUR Negative 08/08/2013 1947   Sepsis Labs: @LABRCNTIP (procalcitonin:4,lacticidven:4)  ) Recent Results (from the past 240 hour(s))  SARS CORONAVIRUS 2 (TAT 6-24 HRS) Nasopharyngeal Nasopharyngeal Swab     Status: None   Collection Time: 03/12/19  9:31 PM   Specimen: Nasopharyngeal Swab  Result Value Ref Range Status   SARS Coronavirus 2 NEGATIVE NEGATIVE Final    Comment: (NOTE) SARS-CoV-2 target nucleic acids are NOT DETECTED. The SARS-CoV-2 RNA is generally detectable in upper and lower respiratory specimens during the acute phase of infection. Negative results do not preclude SARS-CoV-2 infection, do not rule out co-infections with other pathogens, and should not be used as the sole basis for treatment or other patient management decisions. Negative results must be combined with clinical observations, patient history, and epidemiological information. The expected result is Negative. Fact Sheet for Patients: SugarRoll.be Fact Sheet for Healthcare Providers: https://www.woods-mathews.com/ This test is not yet approved or cleared by the [place] FDA and  has been authorized for detection and/or diagnosis of SARS-CoV-2 by FDA under an Emergency Use Authorization (EUA). This EUA will remain  in effect (meaning this test can be used) for the duration of the COVID-19 declaration under Section 56 4(b)(1) of the Act, 21 U.S.C. section 360bbb-3(b)(1), unless the authorization is terminated or revoked sooner. Performed at [facility], [place] [address] [place],  [place] [address]       Studies: No results found.  Scheduled Meds: . amLODipine  10 mg Oral Daily  . aspirin EC  81 mg Oral Daily  . atorvastatin  80 mg Oral Daily  . calcium acetate  1,334 mg Oral TID WC  . calcium carbonate  1,250 mg Oral TID WC  . clopidogrel  75 mg Oral Daily  . epoetin (EPOGEN/PROCRIT) injection  10,000 Units Subcutaneous Weekly  . gabapentin  200 mg Oral BID  . gentamicin cream  1 application Topical Daily  . heparin  5,000 Units Subcutaneous Q8H  .  insulin aspart  0-15 Units Subcutaneous TID WC  . levETIRAcetam  500 mg Oral BID  . levothyroxine  200 mcg Oral q morning - 10a  . lipase/protease/amylase  24,000 Units Oral TID  . losartan  100 mg Oral Daily  . metoprolol succinate  50 mg Oral Daily  . sodium bicarbonate  650 mg Oral BID    Continuous Infusions: . dialysis solution 1.5% low-MG/low-CA       LOS: 4 days     Annita Brod, MD [facility]  To reach me or the doctor on call, go to: [email or web address] Password [facility]  03/17/2019, 3:22 PM

## 2019-03-17 NOTE — Progress Notes (Signed)
Inpatient Rehabilitation Admissions Coordinator  Inpatient rehab consult received. I contacted both pt and his wife by phone to discuss inpt rehab goals and expectations. Both are in agreement and prefer CIR. We do accept patients with CCPD. I await updated PT and OT assessments and will proceed with [facility] authorization  If recommendations are for CIR, not SNF. I have alerted RN CM, Dr. [place] Pink and OT of need.  Danne Baxter, RN, MSN Rehab Admissions Coordinator [phone] 03/17/2019 12:41 PM

## 2019-03-17 NOTE — TOC Initial Note (Signed)
Transition of Care [facility]) - Initial/Assessment Note    Patient Details  Name: Kyle [name] MRN: [id number] Date of Birth: 24-Feb-1951  Transition of [facility]) CM/SW Contact:    Victorino Dike, RN Phone Number: 03/17/2019, 1:00 PM  Clinical Narrative:                 Met with patient yesterday and spoke with wife this morning.  They are concerned about patient returning home at current state of health and would like to continue rehab.  Patient is also a peritoneal dialysis patient and does not want to change to hemodialysis or go to a snf.  It is the hope of patient, wife an MD that patient can be admitted to CIR unit.  Will continue to follow for further needs.   Expected Discharge Plan: Home/Self Care Barriers to Discharge: Continued Medical Work up   Patient Goals and CMS Choice        Expected Discharge Plan and Services Expected Discharge Plan: Home/Self Care       Living arrangements for the past 2 months: Single Family Home                                      Prior Living Arrangements/Services Living arrangements for the past 2 months: Single Family Home Lives with:: Self, Spouse Patient language and need for interpreter reviewed:: Yes Do you feel safe going back to the place where you live?: Yes      Need for Family Participation in Patient Care: No (Comment) Care giver support system in place?: Yes (comment)   Criminal Activity/Legal Involvement Pertinent to Current Situation/Hospitalization: No - Comment as needed  Activities of Daily Living Home Assistive Devices/Equipment: Walker (specify type) ADL Screening (condition at time of admission) Patient's cognitive ability adequate to safely complete daily activities?: Yes Is the patient deaf or have difficulty hearing?: No Does the patient have difficulty seeing, even when wearing glasses/contacts?: Yes Does the patient have difficulty concentrating, remembering, or making decisions?:  Yes Patient able to express need for assistance with ADLs?: Yes Does the patient have difficulty dressing or bathing?: Yes Independently performs ADLs?: No Communication: Independent Dressing (OT): Needs assistance Grooming: Needs assistance Feeding: Independent Bathing: Independent Toileting: Independent In/Out Bed: Independent with device (comment) Walks in Home: Independent Does the patient have difficulty walking or climbing stairs?: Yes Weakness of Legs: Both Weakness of Arms/Hands: Both  Permission Sought/Granted                  Emotional Assessment Appearance:: Appears stated age Attitude/Demeanor/Rapport: Engaged Affect (typically observed): Appropriate Orientation: : Oriented to Self, Oriented to Place, Oriented to  Time, Oriented to Situation Alcohol / Substance Use: Not Applicable Psych Involvement: No (comment)  Admission diagnosis:  Hypocalcemia [E83.51] Hypomagnesemia [E83.42] Prolonged Q-T interval on ECG [R94.31] Generalized weakness [R53.1] Patient Active Problem List   Diagnosis Date Noted  . Ptosis of eyelid, left 03/15/2019  . Physical deconditioning 03/14/2019  . Hypomagnesemia 03/13/2019  . Hyperphosphatemia 03/13/2019  . ESRD (end stage renal disease) ([place]) 03/13/2019  . Chronic kidney disease with peritoneal dialysis as preferred modality, stage 5 ([place]) 03/13/2019  . Prolonged QT interval 03/13/2019  . Essential hypertension 03/13/2019  . Hypothyroidism 03/13/2019  . Chronic diarrhea 03/13/2019  . Hypocalcemia 03/12/2019  . Hypokalemia 09/16/2018  . Lung nodule 06/12/2016  . Narrowing of airway   . Cigarette smoker 05/24/2016  .  Collapse of right lung 05/24/2016  . COPD with chronic bronchitis ([place]) 05/24/2016  . Acne 05/20/2015  . Calculus of kidney 05/20/2015  . Chest pain, non-cardiac 05/20/2015  . Seizure ([place]) 05/20/2015  . Current tobacco use 05/20/2015  . Chronic kidney disease (CKD), stage III (moderate) 03/29/2015  . Type  2 diabetes mellitus ([place]) 03/15/2015  . Lipoma of shoulder 03/08/2014  . Other synovitis and tenosynovitis, right shoulder 03/08/2014  . Bursitis of elbow 02/15/2014  . Absolute anemia 08/08/2013  . 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 Back pain, chronic 08/08/2013  . Chronic obstructive pulmonary disease ([place]) 08/08/2013  . Diabetes mellitus ([place]) 08/08/2013  . BP (high blood pressure) 08/08/2013  . HLD (hyperlipidemia) 08/08/2013  . Acne erythematosa 08/08/2013   PCP:  Idelle Crouch, MD Pharmacy:   [facility] [address] [place] [address] Phone: [phone] Fax: [place], [place] [address] [address] Phone: [phone] Fax: [place], [place] [address] Phone: [phone] Fax: [phone]     Social Determinants of Health (SDOH) Interventions    Readmission Risk Interventions No flowsheet data found.

## 2019-03-17 NOTE — Progress Notes (Signed)
Pd comkpleted, total uf for the night was negative 122ml.

## 2019-03-17 NOTE — Progress Notes (Signed)
Occupational Therapy Treatment Patient Details Name: Kyle [name] MRN: [id number] DOB: 08/03/1950 Today's Date: 03/17/2019    History of present illness Pt admitted for hypocalcemia. History includes ESRD on peritoneal dialysis, COPD, HTN, and seizures. Pt currently receiving HHPT, however has been declining physically and reports multiple falls. Of note: pt with new onset ptosis, MRI revealed acute L midbrain infarct.   OT comments  Mr. Gonyea was seen for OT treatment session this date. Pt received supine in low bed. Pt pleasant and agreeable t/o OT session, however does continue to exhibit limited safety awareness and impulsivity with mobility. Pt requires consistent cueing for safety and sequencing t/o session. OT engages pt in seated grooming tasks including hair combing, oral care, and face washing. Pt completes tasks with varying level of assist depending on complexity of task. See ADL section below for additional details. Pt continues to express difficulty with bilateral UE [place]. Pt and provider spend time discussing strategies for pt to better access his personal cell phone in consideration of his decreased motor planning, perseveration, and fine motor coordination deficits. Pt able to successfully stablalize the phone with his R (Dominant UE) and use his LUE for navigating menus and applications this date which proved more successful for the pt as his LUE presented with less [place] deficits than his RUE this date.  Pt was motivated t/o OT session and return demonstrated understanding of education provided throughout the session. Pt continues to benefit from skilled occupational therapy services. Will continue to follow POC as written. DC recommendation and frequency updated to reflect pt current functional status. Upon hospital discharge, recommend pt discharge to CIR for acute intensive rehabilitative services which would maximize pt safety and return to PLOF. Frequency in the acute care  setting updated to 3x/week.    Follow Up Recommendations  CIR    Equipment Recommendations  3 in 1 bedside commode    Recommendations for Other Services Rehab consult    Precautions / Restrictions Precautions Precautions: Fall Restrictions Weight Bearing Restrictions: No       Mobility Bed Mobility Overal bed mobility: Needs Assistance Bed Mobility: Supine to Sit     Supine to sit: HOB elevated;Min assist Sit to supine: Supervision   General bed mobility comments: struggles with HOB raised and rails  with increased time  Transfers Overall transfer level: Needs assistance Equipment used: Rolling walker (2 wheeled) Transfers: Sit to/from Stand Sit to Stand: Mod assist;+2 physical assistance         General transfer comment: assist primarily for balance and safety    Balance Overall balance assessment: Needs assistance;History of Falls Sitting-balance support: Feet supported;Single extremity supported;Bilateral upper extremity supported Sitting balance-Leahy Scale: Poor Sitting balance - Comments: R lateral and posterior leaning, improves with dray placed in front for UE support. With tray, pt is able to complete seated grooming tasks with close supervision for safety. Postural control: Posterior lean;Right lateral lean Standing balance support: Bilateral upper extremity supported Standing balance-Leahy Scale: Poor Standing balance comment: Deferred. Pt completed seated ADL tasks. Requires +2 mod A for functional STS with therapy earlier in day.                           ADL either performed or assessed with clinical judgement   ADL Overall ADL's : Needs assistance/impaired Eating/Feeding: Sitting;Minimal assistance Eating/Feeding Details (indicate cue type and reason): Pt demonstrating improved cognition overall, but continues to be unsafe to sit w/o supervision to eat.  Continues to be bed level for meals. Grooming: Set up;Sitting;Wash/dry face;Oral  care;Minimal assistance;Brushing hair;Moderate assistance;Cueing for safety;Cueing for sequencing;Cueing for compensatory techniques Grooming Details (indicate cue type and reason): Pt performs seated grooming tasks with varying level of assist this date. He continues to have difficulty with dynamic sitting balance and requires moderate assist for tasks that require weight shift/changes in BOS including hair brushing with both arms overhead. He is able to complete oral care given set-up to minimal assist this date. He has poor [place] in BUE with R>L this date. Pt educated on compensatory strategies for holding toothbrush, applying toothpaste, and oral care mgt. No signs/symptoms of aspiration during oral care. Upper Body Bathing: Sitting;Moderate assistance;Cueing for compensatory techniques;Cueing for sequencing;Cueing for safety   Lower Body Bathing: Moderate assistance;+2 for safety/equipment;Sitting/lateral leans;Cueing for compensatory techniques;Cueing for sequencing;Cueing for safety   Upper Body Dressing : Minimal assistance;Sitting;Cueing for safety;Cueing for sequencing;Cueing for compensatory techniques   Lower Body Dressing: Moderate assistance;Sit to/from stand;+2 for physical assistance Lower Body Dressing Details (indicate cue type and reason): Pt continues to have poor dynamic sitting balance. Requires moderate assist for any sit to/from stand tasks including donning lower body clothing. Toilet Transfer: +2 for physical assistance;Stand-pivot;BSC;RW;+2 for safety/equipment Toilet Transfer Details (indicate cue type and reason): Pt notably ataxic, requiring +2 assist for functional mobility this date. Requires moderate assist +2 to maintain standing balance this date. Toileting- Clothing Manipulation and Hygiene: Moderate assistance;+2 for physical assistance;+2 for safety/equipment;Sit to/from stand   Tub/ Shower Transfer: Stand-pivot;+2 for physical assistance;+2 for  safety/equipment;Moderate assistance   Functional mobility during ADLs: +2 for physical assistance;+2 for safety/equipment;Moderate assistance;Rolling walker       Vision Baseline Vision/History: Wears glasses Additional Comments: Pt continues to hav eptosis of L eye t/o session. Appears to overshoot when completing bimanual tasks, will continue to assess vision within a functional context.   Perception     Praxis Praxis Praxis-Other Comments: Pt noted to have difficulty with motor planning and perseveration during functional tasks this date including using his personal cell phone and completing bimanual tasks such as opening small containers and applying toothpaste to his toothbrush.    Cognition Arousal/Alertness: Awake/alert Behavior During Therapy: WFL for tasks assessed/performed Overall Cognitive Status: Within Functional Limits for tasks assessed                                 General Comments: Lack of insight over balance and safety deficits        Exercises Other Exercises Other Exercises: standing ex with RW and +2 support for balance due to increasing right lean.  marches and SLR x 10 BLE Other Exercises: OT engages pt in seated grooming tasks (See ADL section for detail), safety education, compensatory strategies for ADL management, and falls prevention strategies this date.   Shoulder Instructions       General Comments      Pertinent Vitals/ Pain       Pain Assessment: No/denies pain  Home Living     Available Help at Discharge: Family;Available 24 hours/day(spouse; no children)               Bathroom Shower/Tub: Occupational psychologist: Handicapped height Bathroom Accessibility: Yes How Accessible: Accessible via walker        Lives With: Spouse    Prior Functioning/Environment              Frequency  Min 3X/week  Progress Toward Goals  OT Goals(current goals can now be found in the care plan  section)  Progress towards OT goals: Progressing toward goals  Acute Rehab OT Goals Patient Stated Goal: to get better and go home OT Goal Formulation: With patient Time For Goal Achievement: 03/30/19 Potential to Achieve Goals: Good  Plan Discharge plan needs to be updated;Frequency needs to be updated    Co-evaluation                 AM-PAC OT "6 Clicks" Daily Activity     Outcome Measure   Help from another person eating meals?: A Little Help from another person taking care of personal grooming?: A Little Help from another person toileting, which includes using toliet, bedpan, or urinal?: A Lot Help from another person bathing (including washing, rinsing, drying)?: A Lot Help from another person to put on and taking off regular upper body clothing?: A Little Help from another person to put on and taking off regular lower body clothing?: A Lot 6 Click Score: 15    End of Session    OT Visit Diagnosis: History of falling (Z91.81);Other abnormalities of gait and mobility (R26.89);Other symptoms and signs involving the nervous system (R29.898)   Activity Tolerance Patient tolerated treatment well   Patient Left in bed;with call bell/phone within reach;with bed alarm set   Nurse Communication          Time: 4492-0100 OT Time Calculation (min): 34 min  Charges: OT General Charges $OT Visit: 1 Visit OT Treatments $Self Care/Home Management : 23-37 mins  Shara Blazing, M.S., OTR/L Ascom: [phone] 03/17/19, 2:13 PM

## 2019-03-17 NOTE — PMR Pre-admission (Signed)
PMR Admission Coordinator Pre-Admission Assessment  Patient: Kyle [name] is an 69 y.o., male MRN: [id number] DOB: [birth year] Height: 6' (182.9 cm) Weight: (!) 136.5 kg  Insurance Information HMO:     PPO: yes     PCP:      IPA:      80/20:      OTHER:  PRIMARY: [employer] medicare      Policy#: [id number]      Subscriber: pt CM Name: Dot      Phone#: [phone] ext [phone]     Fax#: [phone] Pre-Cert#: [id number] approved for 7 days with f/u Lestine Box ext [phone] same fax    Employer:  Benefits:  Phone #: [phone]     Name: 1/21 Eff. Date: 02/27/2019     Deduct: none      Out of Pocket Max: $4000      Life Max: none CIR: $160 co pay per day days 1 until 10      SNF: no co pay per day days 1 until 20; $50 copy per day days 21 until 100 Outpatient: $20 per visit      Co-Pay: visits per medical neccesity Home Health: 100%      Co-Pay: visit per medical neccesity DME: 80%     Co-Pay: 20% Providers: in network   SECONDARY: none     Medicaid Application Date:       Case Manager:  Disability Application Date:       Case Worker:   The "Data Collection Information Summary" for patients in Inpatient Rehabilitation Facilities with attached "Privacy Act [place] Records" was provided and verbally reviewed with: Patient and Family  Emergency Contact Information Contact Information    Name Relation Home Work Mobile   Lemler,[name] R Spouse [phone]  [phone]      Current Medical History  Patient Admitting Diagnosis: CVA  History of Present Illness: 69 year old male with medical history significant for seizures, ESRD on nightly CCPD, COPD, HTN , Diabetes type 2. Patient followed with GI for persistent diarrhea and bowel incontinence. He had routine blood work done and noted to have significant hypocalcemia and directed to ED for admission Patient takes Tums TID with meals at home. He reports he has been feeling more confused and weak although this has been going  on since he started dialysis about 15 months ago. He has noted increased falls at home weekly and was evaluated by Neurology as an outpatient.   Neurology consulted. 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 Some right dysmetria as well . 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 Etiology felt likely small vessel disease., Patient on Lipitor but no antiplatelet therapy prior to admit. Carotid dopplers show no evidence of significant stenosis. Echo showed no cardiac source of emboli with an EF of 65 to 70%. LDL 48, A1c 7.1. BP controlled. Recommend dual antiplatelet therapy with ASA 81 mg and Plavix 75 mg for 3 weeks with change to ASA 81 mg alone as monotherapy after that time. Recommend smoking cessation.   Complete NIHSS TOTAL: 11  Patient's medical record from [facility] has been reviewed by the rehabilitation admission coordinator and physician.  Past Medical History  Past Medical History:  Diagnosis Date  . Anxiety   . BPH (benign prostatic hyperplasia)   . Chronic kidney disease   . COPD (chronic obstructive pulmonary disease) ([place])   . Depression   . Diabetes mellitus without  complication ([place])   . Hyperlipidemia   . Hypertension   . Hypothyroidism   . Nephrolithiasis   . Seizures ([place])     Family History   family history includes Diabetes in his maternal grandfather, maternal grandmother, and mother; Emphysema in his paternal grandfather; Lung cancer in his father.  Prior Rehab/Hospitalizations Has the patient had prior rehab or hospitalizations prior to admission? Yes  Has the patient had major surgery during 100 days prior to admission? No   Current Medications  Current Facility-Administered Medications:  .  amLODipine (NORVASC) tablet 2.5 mg, 2.5 mg, Oral, Daily, Leslye Peer, Richard, MD, 2.5 mg at 03/20/19 0909 .  aspirin EC tablet 81 mg, 81 mg, Oral, Daily, Sharion Settler, NP, 81 mg at 03/20/19  0908 .  atorvastatin (LIPITOR) tablet 80 mg, 80 mg, Oral, Daily, Sharion Settler, NP, 80 mg at 03/19/19 1744 .  calcitRIOL (ROCALTROL) capsule 0.5 mcg, 0.5 mcg, Oral, Daily, Murlean Iba, MD, 0.5 mcg at 03/20/19 0909 .  calcium acetate (PHOSLO) capsule 1,334 mg, 1,334 mg, Oral, TID WC, Tu, Ching T, DO, 1,334 mg at 03/20/19 0909 .  calcium carbonate (OS-CAL - dosed in mg of elemental calcium) tablet 1,250 mg, 1,250 mg, Oral, TID WC, Annita Brod, MD, 1,250 mg at 03/20/19 0910 .  clopidogrel (PLAVIX) tablet 75 mg, 75 mg, Oral, Daily, Sharion Settler, NP, 75 mg at 03/20/19 0908 .  dialysis solution 1.5% low-MG/low-CA dianeal solution, , Intraperitoneal, Q24H, Kolluru, Sarath, MD, Stopped at 03/18/19 1126 .  epoetin alfa (EPOGEN) injection 10,000 Units, 10,000 Units, Subcutaneous, Weekly, Murlean Iba, MD, 10,000 Units at 03/15/19 0940 .  feeding supplement (NEPRO CARB STEADY) liquid 237 mL, 237 mL, Oral, BID BM, Leslye Peer, Richard, MD, 237 mL at 03/20/19 0910 .  gabapentin (NEURONTIN) capsule 100 mg, 100 mg, Oral, BID, Leslye Peer, Richard, MD, 100 mg at 03/20/19 0909 .  gentamicin cream (GARAMYCIN) 0.1 % 1 application, 1 application, Topical, Daily, Kolluru, Sarath, MD, 1 application at 09/32/35 0910 .  heparin injection 5,000 Units, 5,000 Units, Subcutaneous, Q8H, Tu, Ching T, DO, 5,000 Units at 03/20/19 5732 .  insulin aspart (novoLOG) injection 0-15 Units, 0-15 Units, Subcutaneous, TID WC, Tu, Ching T, DO, 3 Units at 03/20/19 0908 .  levETIRAcetam (KEPPRA) tablet 500 mg, 500 mg, Oral, BID, Tu, Ching T, DO, 500 mg at 03/20/19 0909 .  levothyroxine (SYNTHROID) tablet 200 mcg, 200 mcg, Oral, q morning - 10a, Tu, Ching T, DO, 200 mcg at 03/20/19 2025 .  lipase/protease/amylase (CREON) capsule 24,000 Units, 24,000 Units, Oral, TID, Tu, Ching T, DO, 24,000 Units at 03/20/19 0910 .  losartan (COZAAR) tablet 100 mg, 100 mg, Oral, Daily, Tu, Ching T, DO, 100 mg at 03/20/19 0908 .  magnesium sulfate  IVPB 2 g 50 mL, 2 g, Intravenous, STAT, Wieting, Richard, MD, Last Rate: 50 mL/hr at 03/20/19 0931, 2 g at 03/20/19 0931 .  metoprolol succinate (TOPROL-XL) 24 hr tablet 50 mg, 50 mg, Oral, Daily, Tu, Ching T, DO, 50 mg at 03/20/19 0908 .  multivitamin (RENA-VIT) tablet 1 tablet, 1 tablet, Oral, QHS, Loletha Grayer, MD, 1 tablet at 03/20/19 0909 .  sodium bicarbonate tablet 650 mg, 650 mg, Oral, BID, Kolluru, Sarath, MD, 650 mg at 03/20/19 0908  Patients Current Diet:  Diet Order            Diet renal with fluid restriction Fluid restriction: 1200 mL Fluid; Room service appropriate? Yes with Assist; Fluid consistency: Nectar Thick  Diet effective now  1/21 D2 with nectar thick whole meds with puree  Precautions / Restrictions Precautions Precautions: Fall Restrictions Weight Bearing Restrictions: No   Has the patient had 2 or more falls or a fall with injury in the past year? Yes Wife reports about 10 falls over past 6 months  patient fell out of bed in ED hitting his head while attempting to get out of bed alone.   Prior Activity Level Limited Community (1-2x/wk): independent; drove; did own CCPD  Prior Functional Level Self Care: Did the patient need help bathing, dressing, using the toilet or eating? Needed some help  Indoor Mobility: Did the patient need assistance with walking from room to room (with or without device)? Independent  Stairs: Did the patient need assistance with internal or external stairs (with or without device)? Independent  Functional Cognition: Did the patient need help planning regular tasks such as shopping or remembering to take medications? [place] / [place] Devices/Equipment: Environmental consultant (specify type) Home Equipment: Walker - 2 wheels, Cane - single point, Environmental consultant - standard  Prior Device Use: Indicate devices/aids used by the patient prior to current illness, exacerbation or injury?  cane  Current Functional Level Cognition  Overall Cognitive Status: Impaired/Different from baseline Orientation Level: Oriented to person, Oriented to place Safety/Judgement: Decreased awareness of safety, Decreased awareness of deficits General Comments: Poor insight into functional deficits.    Extremity Assessment (includes Sensation/Coordination)  Upper Extremity Assessment: RUE deficits/detail, LUE deficits/detail RUE Deficits / Details: shld, elbow, grip 4-/5, dysdiadokinesia detected-R hand does not move in sync with L. requires increased time to oppose all fingertips RUE Coordination: decreased fine motor(coordination) LUE Deficits / Details: shld, elbow, grip 4+/5  Lower Extremity Assessment: Defer to PT evaluation, Generalized weakness    ADLs  Overall ADL's : Needs assistance/impaired Eating/Feeding: Sitting, Minimal assistance Eating/Feeding Details (indicate cue type and reason): Pt on modified diet per SLP. Requires nectar thick liquids, but repeatedly requests "regular water" during session. This author declines. Pt continues to have decreased safety awareness and awareness of deficits. Would benefit from supervision for safety during self-feeding. Grooming: Set up, Sitting, Wash/dry face, Oral care, Minimal assistance, Brushing hair, Moderate assistance, Cueing for safety, Cueing for sequencing, Cueing for compensatory techniques Grooming Details (indicate cue type and reason): Pt performs seated grooming tasks with varying level of assist this date. He continues to have difficulty with dynamic sitting balance and requires moderate assist for tasks that require weight shift/changes in BOS including hair brushing with both arms overhead. He is able to complete oral care given set-up to minimal assist this date. He has poor Anna in BUE with R>L this date. Pt educated on compensatory strategies for holding toothbrush, applying toothpaste, and oral care mgt. No signs/symptoms of  aspiration during oral care. Upper Body Bathing: Sitting, Moderate assistance, Cueing for compensatory techniques, Cueing for sequencing, Cueing for safety Lower Body Bathing: Moderate assistance, +2 for safety/equipment, Sitting/lateral leans, Cueing for compensatory techniques, Cueing for sequencing, Cueing for safety Upper Body Dressing : Minimal assistance, Sitting, Cueing for safety, Cueing for sequencing, Cueing for compensatory techniques Lower Body Dressing: Moderate assistance, Sit to/from stand, +2 for physical assistance Lower Body Dressing Details (indicate cue type and reason): Pt continues to have poor dynamic sitting balance. Requires moderate assist for any sit to/from stand tasks including donning lower body clothing. Toilet Transfer: +2 for physical assistance, Stand-pivot, BSC, RW, +2 for safety/equipment Toilet Transfer Details (indicate cue type and reason): Pt notably ataxic, requiring +2  assist for functional mobility this date. Requires moderate assist +2 to maintain standing balance this date. Toileting- Clothing Manipulation and Hygiene: Moderate assistance, +2 for physical assistance, +2 for safety/equipment, Sit to/from stand Tub/ Shower Transfer: Stand-pivot, +2 for physical assistance, +2 for safety/equipment, Moderate assistance Functional mobility during ADLs: +2 for physical assistance, +2 for safety/equipment, Moderate assistance, Rolling walker General ADL Comments: Pt continues to be functionally limited by ataxic movements, poor balance, and limited safety awareness/awareness of deficits. Continues to require +2 mod A for functional mobility or STS tasks such as LB dressing. Min A to supervision for UB tasks that can be completed in sitting such as dressing and grooming.    Mobility  Overal bed mobility: Needs Assistance Bed Mobility: Supine to Sit Supine to sit: Min assist Sit to supine: Supervision General bed mobility comments: Deferred. Pt up in recliner at  start/end of sesison.    Transfers  Overall transfer level: Needs assistance Equipment used: Rolling walker (2 wheeled) Transfers: Sit to/from Stand Sit to Stand: +2 physical assistance, Min assist, +2 safety/equipment General transfer comment: Min A for stability upon initial stand    Ambulation / Gait / Stairs / Wheelchair Mobility  Ambulation/Gait Ambulation/Gait assistance: Mod assist, +2 physical assistance Gait Distance (Feet): 30 Feet Assistive device: Rolling walker (2 wheeled) Gait Pattern/deviations: Decreased step length - right, Decreased step length - left, Step-through pattern, Wide base of support, Drifts right/left, Ataxic General Gait Details: Pt impulsive with amb with Mod A for stability and max verbal cues for amb closer to RW with upright posture Gait velocity: decreased    Posture / Balance Dynamic Sitting Balance Sitting balance - Comments: Pt continues to demonstrate poor sitting balance with back unsupported. Requires cueing and at least 1 UE support t/o session to maintain upright neutral posture. Balance Overall balance assessment: Needs assistance, History of Falls Sitting-balance support: Feet supported, Bilateral upper extremity supported, Single extremity supported Sitting balance-Leahy Scale: Fair Sitting balance - Comments: Pt continues to demonstrate poor sitting balance with back unsupported. Requires cueing and at least 1 UE support t/o session to maintain upright neutral posture. Postural control: Posterior lean, Right lateral lean Standing balance support: Bilateral upper extremity supported, During functional activity Standing balance-Leahy Scale: Poor Standing balance comment: Mod A for stability    Special needs/care consideration BiPAP/CPAP  CPM  Continuous Drip IV  Dialysis   CCPD nightly; dry during the day; patient does himself at home; wife not trained Life Vest  Oxygen  Special Bed  Trach Size  Wound Vac  Skin  Abrasions ankles and  feet bilaterally; pd catheter to abdomen; skin tear to left anterior ankle Bowel mgmt:  Some incontinence LBM 1/21 Bladder mgmt: some urinary incontinence Diabetic mgmt: type 2 diabetes; Hgb A1c 7.1 Behavioral consideration  Chemo/radiation  Designated visitor is wife, [name]   Previous Home Environment  Living Arrangements: Spouse/significant other  Lives With: Spouse Available Help at Discharge: Family, Available 24 hours/day(spouse; no children) Type of Home: House Home Layout: One level Home Access: Stairs to enter Entrance Stairs-Rails: Can reach both Entrance Stairs-Number of Steps: 6 Bathroom Shower/Tub: Multimedia programmer: Handicapped height Bathroom Accessibility: Yes How Accessible: Accessible via [place]: No  Discharge Living Setting Plans for Discharge Living Setting: Patient's home, Lives with (comment)(spouse) Type of Home at Discharge: House Discharge Home Layout: One level Discharge Home Access: Stairs to enter Entrance Stairs-Rails: Left, Right, Can reach both Entrance Stairs-Number of Steps: 6 Discharge Bathroom Shower/Tub: Walk-in shower Discharge  Bathroom Toilet: Handicapped height Discharge Bathroom Accessibility: Yes How Accessible: Accessible via walker Does the patient have any problems obtaining your medications?: No  Social/Family/Support Systems Patient Roles: Spouse Contact Information: wife, [name] Anticipated Caregiver: wife Anticipated Caregiver's Contact Information: see above Ability/Limitations of Caregiver: can provide min assist Caregiver Availability: 24/7 Discharge Plan Discussed with Primary Caregiver: Yes Is Caregiver In Agreement with Plan?: Yes Does Caregiver/Family have Issues with Lodging/Transportation while Pt is in Rehab?: No  Goals/Additional Needs Patient/Family Goal for Rehab: Mod I to sueprvision with PT, supervision to min OT, superivison SLP Expected length of stay: ELOS 10 to 14  days Equipment Needs: CCPD Shanon Payor for 15 months Pt/Family Agrees to Admission and willing to participate: Yes Program Orientation Provided & Reviewed with Pt/Caregiver Including Roles  & Responsibilities: Yes  Decrease burden of Care through IP rehab admission:   Possible need for SNF placement upon discharge: SNF not anticipated nor can patient go to SNF with peritoneal dialysis; would have to switch to hemodialysis for SNF placement. Patient has never been on hemodialysis before.  Patient Condition: I have reviewed medical records from [facility], spoken with CM, and patient and spouse. I discussed via phone for inpatient rehabilitation assessment.  Patient will benefit from ongoing PT, OT and SLP, can actively participate in 3 hours of therapy a day 5 days of the week, and can make measurable gains during the admission.  Patient will also benefit from the coordinated team approach during an Inpatient Acute Rehabilitation admission.  The patient will receive intensive therapy as well as Rehabilitation physician, nursing, social worker, and care management interventions.  Due to bladder management, bowel management, safety, skin/wound care, disease management, medication administration, pain management and patient education the patient requires 24 hour a day rehabilitation nursing.  The patient is currently Mod assist with mobility and basic ADLs.  Discharge setting and therapy post discharge at home with home health is anticipated.  Patient has agreed to participate in the Acute Inpatient Rehabilitation Program and will admit today.  Preadmission Screen Completed By:  Cleatrice Burke, 03/20/2019 9:58 AM ______________________________________________________________________   Discussed status with Dr. Naaman Plummer  on  03/20/2019  at  1000 and received approval for admission today.  Admission Coordinator:  Cleatrice Burke, RN, time  1000 Date  03/20/2019   Assessment/Plan: Diagnosis: left  midbrain infarct 1. Does the need for close, 24 hr/day Medical supervision in concert with the patient's rehab needs make it unreasonable for this patient to be served in a less intensive setting? Yes 2. Co-Morbidities requiring supervision/potential complications: sz d/o, ESRD on PD, COPD, HTN, DM 3. Due to bladder management, bowel management, safety, skin/wound care, disease management, medication administration, pain management and patient education, does the patient require 24 hr/day rehab nursing? Yes 4. Does the patient require coordinated care of a physician, rehab nurse, PT, OT, and SLP to address physical and functional deficits in the context of the above medical diagnosis(es)? Yes Addressing deficits in the following areas: balance, endurance, locomotion, strength, transferring, bowel/bladder control, bathing, dressing, feeding, grooming, toileting, cognition, speech and psychosocial support 5. Can the patient actively participate in an intensive therapy program of at least 3 hrs of therapy 5 days a week? Yes 6. The potential for patient to make measurable gains while on inpatient rehab is excellent 7. Anticipated functional outcomes upon discharge from inpatient rehab: modified independent and supervision PT, supervision and min assist OT, supervision SLP 8. Estimated rehab length of stay to reach the above functional goals  is: 10-14 days 9. Anticipated discharge destination: Home 10. Overall Rehab/Functional Prognosis: excellent   MD Signature: Meredith Staggers, MD, [place] [facility] 03/20/2019

## 2019-03-17 NOTE — Progress Notes (Signed)
PROGRESS NOTE  Kyle [name] TIW:[id number] DOB: [birth year] DOA: 03/12/2019 PCP: Idelle Crouch, MD  HPI/Recap of past 47 hours: 69 year old male with past medical history of end-stage renal disease on peritoneal dialysis, COPD, hypertension and seizure disorder presented to the hospital on 1/14 with concern for abnormal labs.  He has been following with GI for issues with persistent diarrhea and bowel incontinence and on routine blood work was noted to have significant hypocalcemia with a calcium on admission that was less than 5.5.  Patient has had this in the past and takes Tums 3 times a day at home.  According to his wife, the patient does not sleep well so at times he will skip his home peritoneal dialysis.  There have been reports he is been having progressive weakness.  His wife is able to clarify that outside of the physical therapy at home health PT does, he does not move or do any exercises beyond that.  Following admission, patient confused fell out of bed hitting his head while in the ER when he attempted to get out of bed on his own.  He was evaluated and no signs of any head bleed or stroke by head CT.  He is noted to have left-sided ptosis which he says started after he fell and hit his head although this is not clear if this is the case.  MRI pending.  Following hospitalization, patient received peritoneal dialysis and electrolytes have improved although calcium is not yet normalized.  Patient has become more alert, but is still quite weak although he appears to be much more alert.  He does not listen when nursing tells him not to get up on his own, and attempts to do so, but is still so weak that he immediately falls back to bed.  In speaking with his wife, he is horribly deconditioned, even at home and outside of when home physical therapy was coming to his house, he does not really ambulate or do any exercise.  It was also noted the patient had left eyelid ptosis.  According to  the patient, he had gotten this when he he had fallen in the emergency room after trying to get up out of his bed without help.  MRI of the brain done 1/17 evening noted evidence of an acute left midbrain CVA.  Neurology notified and work-up initiated.  Dopplers and echocardiogram unremarkable, suspicious for small vessel disease.    PT/OT seen for potential evaluation by inpatient rehab.  Patient doing okay, tired, no other complaints.  Assessment/Plan: Principal Problem:   Hypocalcemia/hypophosphatemia/hypomagnesemia from secondary hyperparathyroidism in patient with end-stage renal disease on peritoneal dialysis: Nephrology consulted.  Complicated by noncompliance.  Has received peritoneal dialysis here.  According to nephrology, he is closer to baseline.  Calcium notes improvement. Active Problems:   Chronic obstructive pulmonary disease ([place]): Stable.  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  A1c at 7.1.  LDL at 48.  Carotid Dopplers and echo unrevealing.  Followed up by neurology.  Patient on aspirin and Plavix.  (Can stop Plavix in 3 weeks)    HLD (hyperlipidemia): On statin.    Seizure ([place]): Stable.  Continue home medications.    Type 2 diabetes mellitus ([place]): CBGs overall stable.  Morbid obesity: Meets criteria BMI greater than 40    Prolonged QT interval   Essential hypertension: Blood pressures slightly on the higher end.  Managed by peritoneal dialysis and home medications.    Hypothyroidism: Continue Synthroid.  PVD: Patient's wife had related that patient has been told recently that he had a blockage.  Discussed this with his primary care physician who had referred him for ABIs which did indeed confirm peripheral vascular disease.  Patient has not yet seen a vascular surgeon for this.  He should get strong benefits by starting on aspirin and Plavix already given his above CVA.    Physical deconditioning: No vexing issue.  The patient himself is  not very motivated and has become quite deconditioned even at home.  He is currently barely able to stand.  This is in part due to electrolyte abnormalities but also due to his refusal to participate in the exercises recommended by physical therapy.  His wife tells me that she is unable to care for him.  There are no skilled nursing facilities that will take him while he is on peritoneal dialysis.  Patient unwilling to convert to hemodialysis.  However, he may be a candidate for inpatient rehab who can take patient on peritoneal dialysis.  Waiting for PT and OT evaluations.  overweight: Meets criteria with BMI greater than 25.  Code Status: Full code   Family Communication: Updated wife by phone.  Disposition Plan: PT and OT evaluations, hopefully patient will be candidate for inpatient rehab and can be approved.  If he is approved, can go once bed available.   Consultants:  Nephrology  Neurology  Inpatient rehab  Procedures:  Peritoneal dialysis  Echocardiogram: Preserved ejection fraction.  Diastolic dysfunction.  Carotid Dopplers: No significant carotid artery stenosis.  Antimicrobials:  None  DVT prophylaxis: Heparin   Objective: Vitals:   03/17/19 0958 03/17/19 1517  BP:  137/68  Pulse: 66 62  Resp:  18  Temp:  (!) 97.5 F (36.4 C)  SpO2:  100%    Intake/Output Summary (Last 24 hours) at 03/17/2019 1556 Last data filed at 03/17/2019 1005 Gross per 24 hour  Intake 120 ml  Output 0 ml  Net 120 ml   Filed Weights   03/12/19 1924 03/15/19 0530  Weight: 93.9 kg (!) 201.6 kg   Body mass index is 60.28 kg/m.  Exam:   General: Alert and oriented x2 or 3, no acute distress  HEENT: Normocephalic.  Left-sided ptosis.  Other cranial nerves appear intact.  Mucous membranes slightly dry.  Cardiovascular: Regular rate and rhythm, S1-S2  Respiratory: Clear to auscultation bilaterally  Abdomen: Soft, nontender, nondistended, positive bowel  sounds  Musculoskeletal: No clubbing or cyanosis or edema  Skin: No skin breaks, tears or lesions  Psychiatry: Appropriate, no evidence of psychoses  Neuro: Persistent left-sided ptosis.  Other than generalized none focal weakness, no other neurological deficits   Data Reviewed: CBC: Recent Labs  Lab 03/12/19 1947 03/13/19 0436 03/14/19 0623  WBC 8.1 7.2 5.6  NEUTROABS 6.0  --   --   HGB 9.0* 8.1* 8.3*  HCT 26.5* 24.0* 24.9*  MCV 95.7 96.0 96.5  PLT 209 183 053   Basic Metabolic Panel: Recent Labs  Lab 03/12/19 1947 03/13/19 0436 03/14/19 0623 03/16/19 1443  NA 135 135 137 141  K 3.8 3.9 3.6 3.5  CL 103 106 102 102  CO2 14* 17* 19* 24  GLUCOSE 136* 107* 233* 73  BUN 67* 66* 59* 50*  CREATININE 10.31* 10.37* 9.39* 9.22*  CALCIUM 5.6* 5.6* 6.5* 8.1*  MG 1.4*  --  1.8  --   PHOS 9.1*  --  9.0* 9.5*   GFR: Estimated Creatinine Clearance: 13.4 mL/min (A) (by C-G  formula based on SCr of 9.22 mg/dL (H)). Liver Function Tests: Recent Labs  Lab 03/13/19 0436 03/14/19 0623 03/16/19 1443  AST 14* 14*  --   ALT 9 10  --   ALKPHOS 86 92  --   BILITOT 0.7 0.6  --   PROT 5.1* 5.2*  --   ALBUMIN 2.1* 2.1* 2.4*   No results for input(s): LIPASE, AMYLASE in the last 168 hours. No results for input(s): AMMONIA in the last 168 hours. Coagulation Profile: No results for input(s): INR, PROTIME in the last 168 hours. Cardiac Enzymes: No results for input(s): CKTOTAL, CKMB, CKMBINDEX, TROPONINI in the last 168 hours. BNP (last 3 results) No results for input(s): PROBNP in the last 8760 hours. HbA1C: Recent Labs    03/17/19 0600  HGBA1C 7.1*   CBG: Recent Labs  Lab 03/16/19 1202 03/16/19 1632 03/16/19 2031 03/17/19 0800 03/17/19 1159  GLUCAP 244* 117* 187* 196* 207*   Lipid Profile: Recent Labs    03/16/19 0533  CHOL 130  HDL 41  LDLCALC 48  TRIG 207*  CHOLHDL 3.2   Thyroid Function Tests: No results for input(s): TSH, T4TOTAL, FREET4, T3FREE,  THYROIDAB in the last 72 hours. Anemia Panel: No results for input(s): VITAMINB12, FOLATE, FERRITIN, TIBC, IRON, RETICCTPCT in the last 72 hours. Urine analysis:    Component Value Date/Time   COLORURINE YELLOW (A) 07/27/2016 1648   APPEARANCEUR CLEAR (A) 07/27/2016 1648   APPEARANCEUR Clear 08/08/2013 1947   LABSPEC 1.021 07/27/2016 1648   LABSPEC 1.012 08/08/2013 1947   PHURINE 5.0 07/27/2016 1648   GLUCOSEU >=500 (A) 07/27/2016 1648   GLUCOSEU 50 mg/dL 08/08/2013 1947   HGBUR SMALL (A) 07/27/2016 1648   BILIRUBINUR NEGATIVE 07/27/2016 1648   BILIRUBINUR Negative 08/08/2013 1947   KETONESUR NEGATIVE 07/27/2016 1648   PROTEINUR >=300 (A) 07/27/2016 1648   NITRITE NEGATIVE 07/27/2016 1648   LEUKOCYTESUR NEGATIVE 07/27/2016 1648   LEUKOCYTESUR Negative 08/08/2013 1947   Sepsis Labs: @LABRCNTIP (procalcitonin:4,lacticidven:4)  ) Recent Results (from the past 240 hour(s))  SARS CORONAVIRUS 2 (TAT 6-24 HRS) Nasopharyngeal Nasopharyngeal Swab     Status: None   Collection Time: 03/12/19  9:31 PM   Specimen: Nasopharyngeal Swab  Result Value Ref Range Status   SARS Coronavirus 2 NEGATIVE NEGATIVE Final    Comment: (NOTE) SARS-CoV-2 target nucleic acids are NOT DETECTED. The SARS-CoV-2 RNA is generally detectable in upper and lower respiratory specimens during the acute phase of infection. Negative results do not preclude SARS-CoV-2 infection, do not rule out co-infections with other pathogens, and should not be used as the sole basis for treatment or other patient management decisions. Negative results must be combined with clinical observations, patient history, and epidemiological information. The expected result is Negative. Fact Sheet for Patients: SugarRoll.be Fact Sheet for Healthcare Providers: https://www.woods-mathews.com/ This test is not yet approved or cleared by the [place] FDA and  has been authorized for detection  and/or diagnosis of SARS-CoV-2 by FDA under an Emergency Use Authorization (EUA). This EUA will remain  in effect (meaning this test can be used) for the duration of the COVID-19 declaration under Section 56 4(b)(1) of the Act, 21 U.S.C. section 360bbb-3(b)(1), unless the authorization is terminated or revoked sooner. Performed at [facility], [place] [address] [place], [place] [address]       Studies: No results found.  Scheduled Meds: . amLODipine  10 mg Oral Daily  . aspirin EC  81 mg Oral Daily  . atorvastatin  80  mg Oral Daily  . calcium acetate  1,334 mg Oral TID WC  . calcium carbonate  1,250 mg Oral TID WC  . clopidogrel  75 mg Oral Daily  . epoetin (EPOGEN/PROCRIT) injection  10,000 Units Subcutaneous Weekly  . gabapentin  200 mg Oral BID  . gentamicin cream  1 application Topical Daily  . heparin  5,000 Units Subcutaneous Q8H  . insulin aspart  0-15 Units Subcutaneous TID WC  . levETIRAcetam  500 mg Oral BID  . levothyroxine  200 mcg Oral q morning - 10a  . lipase/protease/amylase  24,000 Units Oral TID  . losartan  100 mg Oral Daily  . metoprolol succinate  50 mg Oral Daily  . sodium bicarbonate  650 mg Oral BID    Continuous Infusions: . dialysis solution 1.5% low-MG/low-CA       LOS: 4 days     Annita Brod, MD [facility]  To reach me or the doctor on call, go to: [email or web address] Password [facility]  03/17/2019, 3:56 PM

## 2019-03-17 NOTE — Progress Notes (Signed)
[facility], [place] 03/17/19  Subjective:   Hospital day # 4  Denies any acute shortness of breath Some error alarms on PD machine this morning Getting ready to work with physical therapy when seen this morning  Renal: 01/18 0701 - 01/19 0700 In: 240 [P.O.:240] Out: 718  Lab Results  Component Value Date   CREATININE 9.22 (H) 03/16/2019   CREATININE 9.39 (H) 03/14/2019   CREATININE 10.37 (H) 03/13/2019     Objective:  Vital signs in last 24 hours:  Temp:  [97.6 F (36.4 C)-98.2 F (36.8 C)] 97.8 F (36.6 C) (01/19 0801) Pulse Rate:  [58-69] 58 (01/19 0801) Resp:  [20] 20 (01/19 0801) BP: (134-144)/(59-73) 140/59 (01/19 0801) SpO2:  [98 %-100 %] 100 % (01/19 0801)  Weight change:  Filed Weights   03/12/19 1924 03/15/19 0530  Weight: 93.9 kg (!) 201.6 kg    Intake/Output:    Intake/Output Summary (Last 24 hours) at 03/17/2019 0907 Last data filed at 03/17/2019 0838 Gross per 24 hour  Intake 240 ml  Output 0 ml  Net 240 ml     Physical Exam: General:  No acute distress, laying in the bed  HEENT  anicteric, moist oral mucous membranes, left ptosis  Pulm/lungs  normal breathing effort, clear to auscultation  CVS/Heart  no rub or gallop  Abdomen:   Soft, nontender, PD catheter in place  Extremities:  No edema  Neurologic:  Alert, oriented to self  Skin:  No acute rashes  Access:  PD catheter       Basic Metabolic Panel:  Recent Labs  Lab 03/12/19 1947 03/12/19 1947 03/13/19 0436 03/14/19 0623 03/16/19 1443  NA 135  --  135 137 141  K 3.8  --  3.9 3.6 3.5  CL 103  --  106 102 102  CO2 14*  --  17* 19* 24  GLUCOSE 136*  --  107* 233* 73  BUN 67*  --  66* 59* 50*  CREATININE 10.31*  --  10.37* 9.39* 9.22*  CALCIUM 5.6*   < > 5.6* 6.5* 8.1*  MG 1.4*  --   --  1.8  --   PHOS 9.1*  --   --  9.0* 9.5*   < > = values in this interval not displayed.     CBC: Recent Labs  Lab 03/12/19 1947 03/13/19 0436  03/14/19 0623  WBC 8.1 7.2 5.6  NEUTROABS 6.0  --   --   HGB 9.0* 8.1* 8.3*  HCT 26.5* 24.0* 24.9*  MCV 95.7 96.0 96.5  PLT 209 183 178     No results found for: HEPBSAG, HEPBSAB, HEPBIGM    Microbiology:  Recent Results (from the past 240 hour(s))  SARS CORONAVIRUS 2 (TAT 6-24 HRS) Nasopharyngeal Nasopharyngeal Swab     Status: None   Collection Time: 03/12/19  9:31 PM   Specimen: Nasopharyngeal Swab  Result Value Ref Range Status   SARS Coronavirus 2 NEGATIVE NEGATIVE Final    Comment: (NOTE) SARS-CoV-2 target nucleic acids are NOT DETECTED. The SARS-CoV-2 RNA is generally detectable in upper and lower respiratory specimens during the acute phase of infection. Negative results do not preclude SARS-CoV-2 infection, do not rule out co-infections with other pathogens, and should not be used as the sole basis for treatment or other patient management decisions. Negative results must be combined with clinical observations, patient history, and epidemiological information. The expected result is Negative. Fact Sheet for Patients: SugarRoll.be Fact Sheet for Healthcare Providers:  https://www.woods-mathews.com/ This test is not yet approved or cleared by the [place] and  has been authorized for detection and/or diagnosis of SARS-CoV-2 by FDA under an Emergency Use Authorization (EUA). This EUA will remain  in effect (meaning this test can be used) for the duration of the COVID-19 declaration under Section 56 4(b)(1) of the Act, 21 U.S.C. section 360bbb-3(b)(1), unless the authorization is terminated or revoked sooner. Performed at [facility], [place] [address], [place], [place] [address]     Coagulation Studies: No results for input(s): LABPROT, INR in the last 72 hours.  Urinalysis: No results for input(s): COLORURINE, LABSPEC, PHURINE, GLUCOSEU, HGBUR, BILIRUBINUR, KETONESUR, PROTEINUR, UROBILINOGEN, NITRITE,  LEUKOCYTESUR in the last 72 hours.  Invalid input(s): APPERANCEUR    Imaging: MR BRAIN WO CONTRAST  Result Date: 03/15/2019 CLINICAL DATA:  Renal failure patient.  Fell from the bed. EXAM: MRI HEAD WITHOUT CONTRAST TECHNIQUE: Multiplanar, multiecho pulse sequences of the brain and surrounding structures were obtained without intravenous contrast. COMPARISON:  Head CT 03/13/2019.  MRI 08/26/2013. FINDINGS: Brain: Diffusion imaging shows acute infarction of the left para median mid brain. No other acute infarction. There is an old infarction of the left para median pons. Few old small vessel cerebellar infarctions. Cerebral hemispheres elsewhere show chronic small-vessel ischemic changes throughout the white matter. Old punctate lacunar infarction left thalamus. Old lacunar infarction of the white matter adjacent to the frontal horn of the right lateral ventricle. No large vessel territory stroke. No mass, hemorrhage, hydrocephalus or extra-axial collection. Vascular: Major vessels at the base of the brain show flow. Skull and upper cervical spine: Negative Sinuses/Orbits: Mucosal inflammation and opacification of the left maxillary sinus. T2 signal loss raises the possibility of fungal sinusitis versus inspissated mucus. Orbits negative. Other: None IMPRESSION: Acute infarction of the left paramedian mid brain. No hemorrhage or mass effect. 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 Chronic small-vessel ischemic changes elsewhere throughout the brain as outlined above. Opacification of the left maxillary sinus with T2 signal loss. This could be due to fungal sinusitis or chronic inspissated mucus. Electronically Signed   By: Nelson Chimes M.D.   On: 03/15/2019 17:58   US Carotid Bilateral  Result Date: 03/16/2019 CLINICAL DATA:  Hypertension, stroke symptoms, hyperlipidemia EXAM: BILATERAL CAROTID DUPLEX ULTRASOUND TECHNIQUE: Pearline Cables scale imaging, color Doppler and duplex ultrasound were performed of  bilateral carotid and vertebral arteries in the neck. COMPARISON:  None. FINDINGS: Criteria: Quantification of carotid stenosis is based on velocity parameters that correlate the residual internal carotid diameter with NASCET-based stenosis levels, using the diameter of the distal internal carotid lumen as the denominator for stenosis measurement. The following velocity measurements were obtained: RIGHT ICA: 108/28 cm/sec CCA: 86/76 cm/sec SYSTOLIC ICA/CCA RATIO:  1.6 ECA: 168 cm/sec LEFT ICA: 86/22 cm/sec CCA: 19/50 cm/sec SYSTOLIC ICA/CCA RATIO:  1.1 ECA: 131 cm/sec RIGHT CAROTID ARTERY: Minor echogenic shadowing plaque formation. No hemodynamically significant right ICA stenosis, velocity elevation, or turbulent flow. Degree of narrowing less than 50%. RIGHT VERTEBRAL ARTERY:  Antegrade LEFT CAROTID ARTERY: Similar scattered minor echogenic plaque formation. No hemodynamically significant left ICA stenosis, velocity elevation, or turbulent flow. LEFT VERTEBRAL ARTERY:  Antegrade IMPRESSION: Minor carotid atherosclerosis. No hemodynamically significant ICA stenosis. Degree of narrowing less than 50% bilaterally by ultrasound criteria. Patent antegrade vertebral flow bilaterally Electronically Signed   By: Jerilynn Mages.  Shick M.D.   On: 03/16/2019 10:47   ECHOCARDIOGRAM COMPLETE  Result Date: 03/16/2019   ECHOCARDIOGRAM REPORT   Patient  Name:   Kyle [name] [place] Date of Exam: 03/16/2019 Medical Rec #:  [id number]          Height:       72.0 in Accession #:    [id number]         Weight:       444.4 lb Date of Birth:  [birth year]          BSA:          2.99 m Patient Age:    17 years           BP:           127/66 mmHg Patient Gender: M                  HR:           63 bpm. Exam Location:  [facility] Procedure: 2D Echo, Cardiac Doppler and Color Doppler Indications:     Stroke 434.91  History:         Patient has no prior history of Echocardiogram examinations.                  COPD; Risk Factors:Hypertension and Diabetes. CKD.   Sonographer:     Sherrie Sport RDCS (AE) Referring Phys:  [id number] BRENDA MORRISON Diagnosing Phys: Bartholome Bill MD  Sonographer Comments: Technically difficult study due to poor echo windows. Image acquisition challenging due to respiratory motion, Image acquisition challenging due to COPD and All views are modified, non- standard views. IMPRESSIONS  1. Left ventricular ejection fraction, by visual estimation, is 65 to 70%. The left ventricle has normal function. Left ventricular septal wall thickness was mildly increased. Mildly increased left ventricular posterior wall thickness. There is mildly increased left ventricular hypertrophy.  2. Left ventricular diastolic parameters are consistent with Grade I diastolic dysfunction (impaired relaxation).  3. The left ventricle has no regional wall motion abnormalities.  4. Global right ventricle was not well visualized.The right ventricular size is normal. No increase in right ventricular wall thickness.  5. Left atrial size was normal.  6. Right atrial size was normal.  7. The mitral valve was not well visualized. Trivial mitral valve regurgitation.  8. The tricuspid valve is not well visualized.  9. The aortic valve was not well visualized. Aortic valve regurgitation is not visualized. 10. The pulmonic valve was not well visualized. Pulmonic valve regurgitation is not visualized. 11. The aortic root was not well visualized. 12. The interatrial septum was not well visualized. FINDINGS  Left Ventricle: Left ventricular ejection fraction, by visual estimation, is 65 to 70%. The left ventricle has normal function. The left ventricle has no regional wall motion abnormalities. Mildly increased left ventricular posterior wall thickness. There is mildly increased left ventricular hypertrophy. Left ventricular diastolic parameters are consistent with Grade I diastolic dysfunction (impaired relaxation). Right Ventricle: The right ventricular size is normal. No increase in right  ventricular wall thickness. Global RV systolic function is was not well visualized. Left Atrium: Left atrial size was normal in size. Right Atrium: Right atrial size was normal in size Pericardium: There is no evidence of pericardial effusion. Mitral Valve: The mitral valve was not well visualized. Trivial mitral valve regurgitation. Tricuspid Valve: The tricuspid valve is not well visualized. Tricuspid valve regurgitation is mild. Aortic Valve: The aortic valve was not well visualized. Aortic valve regurgitation is not visualized. Pulmonic Valve: The pulmonic valve was not well visualized. Pulmonic valve regurgitation is not visualized. Pulmonic regurgitation is not visualized. Aorta:  The aortic root was not well visualized. IAS/Shunts: The interatrial septum was not well visualized.  LEFT VENTRICLE PLAX 2D LVIDd:         4.59 cm LVIDs:         2.82 cm LV PW:         1.32 cm LV IVS:        1.16 cm LVOT diam:     2.10 cm LV SV:         67 ml LV SV Index:   20.11 LVOT Area:     3.46 cm  LEFT ATRIUM         Index LA diam:    3.60 cm 1.20 cm/m                        PULMONIC VALVE AORTA                 RVOT Peak grad: 2 mmHg Ao Root diam: 3.70 cm   SHUNTS Systemic Diam: 2.10 cm  Bartholome Bill MD Electronically signed by Bartholome Bill MD Signature Date/Time: 03/16/2019/12:48:59 PM    Final      Medications:   . dialysis solution 1.5% low-MG/low-CA     . amLODipine  10 mg Oral Daily  . aspirin EC  81 mg Oral Daily  . atorvastatin  80 mg Oral Daily  . calcium acetate  1,334 mg Oral TID WC  . calcium carbonate  1,250 mg Oral TID WC  . clopidogrel  75 mg Oral Daily  . epoetin (EPOGEN/PROCRIT) injection  10,000 Units Subcutaneous Weekly  . gabapentin  200 mg Oral BID  . gentamicin cream  1 application Topical Daily  . heparin  5,000 Units Subcutaneous Q8H  . insulin aspart  0-15 Units Subcutaneous TID WC  . levETIRAcetam  500 mg Oral BID  . levothyroxine  200 mcg Oral q morning - 10a  .  lipase/protease/amylase  24,000 Units Oral TID  . losartan  100 mg Oral Daily  . metoprolol succinate  50 mg Oral Daily  . sodium bicarbonate  650 mg Oral BID     Assessment/ Plan:  69 y.o. male with end stage renal disease on peritoneal dialysis, hypertension, diabetes mellitus type II, seizure disorder, nephrolithiasis, hypothyroidism, hyperlipidemia, depression, BPH, COPD  admitted on 03/12/2019 for Hypocalcemia [E83.51] Hypomagnesemia [E83.42] Prolonged Q-T interval on ECG [R94.31] Generalized weakness [R53.1]  [facility] 94kg CCPD 9 hours 5 exchanges 2532mL fills  Dry during daytime  #End-stage renal disease Patient reports missing couple of dialysis treatments prior to admission.  He states it was because he was feeling bad He also misses treatments regularly stating that his body can only handle so much dialysis At present, continue CCPD based on home regimen Discharge planning: If patient is sent to SNF/rehab, he will need to convert to hemodialysis with PermCath placement Physical therapist reported that patient is too weak and may need rehab placement.  #Secondary hyperparathyroidism with severe hypocalcemia Lab Results  Component Value Date   PTH 91 (H) 03/13/2019   CALCIUM 8.1 (L) 03/16/2019   PHOS 9.5 (H) 03/16/2019   High phosphorus and low calcium is noted Encourage compliance with binders-PhosLo Continue oral calcium with vitamin D  #Anemia of chronic kidney disease Lab Results  Component Value Date   HGB 8.3 (L) 03/14/2019  Continue Epogen subcu weekly (Saturday)  # Acute stroke Acute infarction of paramedian left mid brain Asa, atorvastatin, plavix  LOS: [place] 1/19/20219:07 AM  [place], [place]  Note: This note was prepared with Dragon dictation. Any transcription errors are unintentional

## 2019-03-17 NOTE — Evaluation (Signed)
Objective Swallowing Evaluation: Type of Study: MBS-Modified Barium Swallow Study   Patient Details  Name: Kyle [name] MRN: [id number] Date of Birth: January 31, 1951  Today's Date: 03/17/2019 Time: SLP Start Time (ACUTE ONLY): 1400 -SLP Stop Time (ACUTE ONLY): 1500  SLP Time Calculation (min) (ACUTE ONLY): 60 min   Past Medical History:  Past Medical History:  Diagnosis Date  . Anxiety   . BPH (benign prostatic hyperplasia)   . Chronic kidney disease   . COPD (chronic obstructive pulmonary disease) ([place])   . Depression   . Diabetes mellitus without complication ([place])   . Hyperlipidemia   . Hypertension   . Hypothyroidism   . Nephrolithiasis   . Seizures ([place])    Past Surgical History:  Past Surgical History:  Procedure Laterality Date  . COLONOSCOPY    . COLONOSCOPY WITH PROPOFOL N/A 05/30/2015   Procedure: COLONOSCOPY WITH PROPOFOL;  Surgeon: Manya Silvas, MD;  Location: [facility] ENDOSCOPY;  Service: Endoscopy;  Laterality: N/A;  . COLONOSCOPY WITH PROPOFOL N/A 12/01/2018   Procedure: COLONOSCOPY WITH PROPOFOL;  Surgeon: Toledo, [name], MD;  Location: [facility] ENDOSCOPY;  Service: Gastroenterology;  Laterality: N/A;  . kidney stone    . THYROIDECTOMY    . VARICOCELE EXCISION    . VIDEO BRONCHOSCOPY Bilateral 05/25/2016   Procedure: VIDEO BRONCHOSCOPY WITHOUT FLUORO;  Surgeon: Juanito Doom, MD;  Location: [facility] ENDOSCOPY;  Service: Cardiopulmonary;  Laterality: Bilateral;   HPI: Pt is a 69 y/o male admitted for hypocalcemia. Pt has PMH to include Multiple medical issues including ESRD on peritoneal dialysis daily but has "missed" sessions per pt report, COPD, HTN, and seizures, Tobacco and ETOH use, Lung R Nodule, collapse of R lung. Pt currently receiving HHPT, however has been declining physically and reports multiple falls. 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 Chronic  small-vessel ischemic changes elsewhere throughout the brain.  Prior to admission per chart notes, pt reports that he has been feeling very lightheaded and weak for about the past 24 hours.  He had an episode earlier today where he was eating at a restaurant and suddenly felt very shaky, " like I was going to have a seizure".  He reports his last seizure being over 5 years ago and did not end up having a seizure episode today, states he has been compliant with his Keppra.  He had an appointment with his GI doctor later in the day for chronic diarrhea and had lab work performed at that time.  He then received a call this evening that his Calcium was critically low.  Per NSG report this AM, pt was coughing when trying to swallow Pills.   Subjective: pt sitting in mbss chair; verbal but easily distracted. Expressive language deficits apparent. Pt was able to follow through w/ instructions w/ verbal cues. Dysarthria+.    Assessment / Plan / Recommendation  CHL IP CLINICAL IMPRESSIONS 03/17/2019  Clinical Impression Pt appears to present w/ Mild-Moderate oropharyngeal phase dysphagia w/ Min-Mod increased risk for aspiration during, and post, swallow. With Attention and Focus to task, and using the recommended swallowing precautions and strategies as instructed, pt was able to follow through w/ Aspiration Precautions and use Swallowing Strategies during the po trials. Pt has been dx w/ a new Left paramedian midbrain infarct w/ resulting Cognitive-linguistic deficits along w/ baseline medical issues. He required constant Supervision for follow through w/ precautions/strategies during this study.  During the Pharyngeal phase, pt exhibited Delayed pharyngeal swallow initiation w/ Thin liquids (small, single sips via Cup) spilling to the Pyriform Sinuses b/f pharyngeal swallow initiation engaged; Nectar liquids, puree and softened solids spilled to the Valleculae b/f triggering pharyngeal swallow initiation. Pt  exhibited decreased tongue strength in pharyngeal pressure and slight-min decreased laryngeal excursion during the swallowing -- this resulted in pharyngeal residue including Min+ amount in the Valleculae. With instruction to use a f/u, Dry swallow, pt was able to reduce-clear this residue (this occurred w/ all trial consistencies given). During the Oral phase, pt exhibited Min+ decreased bolus control w/ Premature spillage of Thin and Nectar liquids into the pharynx. Slight-min oral/lingual residue was noted which cleared w/ lingual sweeping and f/u, Dry swallow. Adequate labial sweep from utensil, closure on cup, and no anterior loss noted during this study. The Esophageal phase view was obscured by pt's shoulders just below the UES.  Recommend continue w/ current dysphagia level 2 (minced foods) diet w/ Nectar consistency liquids w/ Therapeutic trials of Thin liquids at bedside w/ SLP guidance for education and follow through w/ aspiration precautions; swallowing strategies. Swallowing strategies to include lingual sweep and f/u, Dry swallow w/ post EACH bite or sip. When it is appropriate, a trial initiation of Thin liquids in pt's diet could be considered. Recommend OMEs; further assessment of Dysarthria. Recommend Monitoring at meals; aspiration precautions; Pills in a Puree or one at a time w/ Nectar liquids.   SLP Visit Diagnosis Dysphagia, oropharyngeal phase (R13.12)  Attention and concentration deficit following --  Frontal lobe and executive function deficit following --  Impact on safety and function Mild aspiration risk;Moderate aspiration risk;Risk for inadequate nutrition/hydration      CHL IP TREATMENT RECOMMENDATION 03/17/2019  Treatment Recommendations Therapy as outlined in treatment plan below;  F/u w/ Language evaluation for Aphasia; Dysarthria evaluation also     Prognosis 03/17/2019  Prognosis for Safe Diet Advancement Fair  Barriers to Reach Goals Cognitive deficits;Language  deficits;Severity of deficits;Time post onset  Barriers/Prognosis Comment --    CHL IP DIET RECOMMENDATION 03/17/2019  SLP Diet Recommendations Dysphagia 2 (Fine chop) solids;Nectar thick liquid  Liquid Administration via Cup;No straw  Medication Administration Whole meds with puree  Compensations Minimize environmental distractions;Slow rate;Small sips/bites;Lingual sweep for clearance of pocketing;Multiple dry swallows after each bite/sip;Follow solids with liquid  Postural Changes Remain semi-upright after after feeds/meals (Comment);Seated upright at 90 degrees      CHL IP OTHER RECOMMENDATIONS 03/17/2019  Recommended Consults (No Data)  Oral Care Recommendations Oral care BID;Oral care before and after PO;Patient independent with oral care;Staff/trained caregiver to provide oral care  Other Recommendations Order thickener from pharmacy;Prohibited food (jello, ice cream, thin soups);Remove water pitcher;Have oral suction available      CHL IP FOLLOW UP RECOMMENDATIONS 03/17/2019  Follow up Recommendations Inpt CIR - for aggressive treatment to address Oropharyngeal phase Dysphagia in order to improve swallowing function in hopes to return to baseline diet prior to admission; also address suspected Aphasia and Dysarthria deficits      CHL IP FREQUENCY AND DURATION 03/17/2019  Speech Therapy Frequency (ACUTE ONLY) min 3x week  Treatment Duration 2 weeks           CHL IP ORAL PHASE 03/17/2019  Oral Phase Impaired  Oral - Pudding Teaspoon --  Oral - Pudding Cup --  Oral - Honey Teaspoon --  Oral - Honey Cup NT  Oral - Nectar Teaspoon --  Oral - Nectar Cup 1 trial  Oral -  Nectar Straw --  Oral - Thin Teaspoon --  Oral - Thin Cup 7 trials  Oral - Thin Straw --  Oral - Puree 1 trial  Oral - Mech Soft 2 trials  Oral - Regular --  Oral - Multi-Consistency --  Oral - Pill --  Oral Phase - Comment pt exhibited Min+ decreased bolus control w/ Premature spillage of Thin and Nectar  liquids into the pharynx. Slight-min oral/lingual residue was noted which cleared w/ lingual sweeping and f/u, Dry swallow. Adequate labial sweep from utensil, closure on cup, and no anterior loss noted during this study.     CHL IP PHARYNGEAL PHASE 03/17/2019  Pharyngeal Phase Impaired  Pharyngeal- Pudding Teaspoon --  Pharyngeal --  Pharyngeal- Pudding Cup --  Pharyngeal --  Pharyngeal- Honey Teaspoon --  Pharyngeal --  Pharyngeal- Honey Cup NT  Pharyngeal --  Pharyngeal- Nectar Teaspoon --  Pharyngeal --  Pharyngeal- Nectar Cup 1 trial  Pharyngeal --  Pharyngeal- Nectar Straw --  Pharyngeal --  Pharyngeal- Thin Teaspoon --  Pharyngeal --  Pharyngeal- Thin Cup 7 trials  Pharyngeal --  Pharyngeal- Thin Straw --  Pharyngeal --  Pharyngeal- Puree 1 trial  Pharyngeal --  Pharyngeal- Mechanical Soft 2 trials  Pharyngeal --  Pharyngeal- Regular --  Pharyngeal --  Pharyngeal- Multi-consistency --  Pharyngeal --  Pharyngeal- Pill --  Pharyngeal --  Pharyngeal Comment pt exhibited Delayed pharyngeal swallow initiation w/ Thin liquids (small, single sips via Cup) spilling to the Pyriform Sinuses b/f pharyngeal swallow initiation engaged; Nectar liquids, puree and softened solids spilled to the Valleculae b/f triggering pharyngeal swallow initiation. Pt exhibited decreased tongue strength in pharyngeal pressure and slight-min decreased laryngeal excursion during the swallowing -- this resulted in pharyngeal residue including Min+ amount in the Valleculae. With instruction to use a f/u, Dry swallow, pt was able to reduce-clear this residue (this occurred w/ all trial consistencies given).      CHL IP CERVICAL ESOPHAGEAL PHASE 03/17/2019  Cervical Esophageal Phase WFL  Pudding Teaspoon --  Pudding Cup --  Honey Teaspoon --  Honey Cup --  Nectar Teaspoon --  Nectar Cup --  Nectar Straw --  Thin Teaspoon --  Thin Cup --  Thin Straw --  Puree --  Mechanical Soft --  Regular --   Multi-consistency --  Pill --  Cervical Esophageal Comment --       Orinda Kenner, MS, CCC-SLP Chance Munter 03/17/2019, 3:21 PM

## 2019-03-17 NOTE — Progress Notes (Signed)
Walked into patients room to give evening medication/insulin, pts speech was more slurred and right eye was almost shut. Pt was unable to keep right arm straight out (drifted toward side). Notified Dr. Baron Hamper CT ordered.

## 2019-03-17 NOTE — Progress Notes (Addendum)
Physical Therapy Treatment Patient Details Name: Kyle [name] MRN: [id number] DOB: 27-Sep-1950 Today's Date: 03/17/2019    History of Present Illness Pt admitted for hypocalcemia. History includes ESRD on peritoneal dialysis, COPD, HTN, and seizures. Pt currently receiving HHPT, however has been declining physically and reports multiple falls. Of note: pt with new onset ptosis, MRI revealed acute L midbrain infarct.    PT Comments    Pt in bed, ready for session.  To edge of bed with min a x 1, HOB raised and use of railing.  Pt struggles but puts in good effort for mobility.  Once sitting, generally steady.  Stands with mod a x 2 with most of assist for balance.  He is able to progress gait to door and back with mod a x 2 with very unsteady gait.  Pt with good strength noted with no buckling but primary barrier remains balance. Upon sitting, he tries to stand by himself while writers back was turned.  Education provided.  Pt stood for ex's with +2 assist with emphasis on marches, SLR and weight shifting as right lean increases with fatigue.  Pt remained in recliner with chair alarm on.  Voiced understanding to call for assist.  RN and tech both aware of need to assist and gait belt left on bed.  Discussed with Dr. Nolon Lennert regarding discharge plan which is complicated by peritoneal dialysis.  At this time, pt remains unsafe for discharge home with wife.  High fall risk and pt is not aware of deficits.  Continue to recommend SNF at this time.   Discussed pt with team.  Given new CVA diagnosis and current mobility levels and assistance needs, we would like to look into CIR to see if pt is appropriate.  He was independent upon admission but at this time has significant balance impairments that limit his safety.  Strength remains good with no buckling with gait.  He presents with decreased cognitive awareness of safety.  Discussed with primary PT S. Tanner with who agrees with plan.      Follow  Up Recommendations  SNF See above for CIR consult     Equipment Recommendations  None recommended by PT    Recommendations for Other Services       Precautions / Restrictions Precautions Precautions: Fall Restrictions Weight Bearing Restrictions: No    Mobility  Bed Mobility Overal bed mobility: Needs Assistance Bed Mobility: Supine to Sit     Supine to sit: HOB elevated;Min assist     General bed mobility comments: struggles with HOB raised and rails  with increased time  Transfers Overall transfer level: Needs assistance Equipment used: Rolling walker (2 wheeled) Transfers: Sit to/from Stand Sit to Stand: Mod assist;+2 physical assistance         General transfer comment: assist primarily for balance and safety  Ambulation/Gait Ambulation/Gait assistance: Mod assist;+2 physical assistance Gait Distance (Feet): 20 Feet Assistive device: Rolling walker (2 wheeled) Gait Pattern/deviations: Decreased step length - right;Decreased step length - left;Step-through pattern;Antalgic;Wide base of support;Drifts right/left Gait velocity: decreased   General Gait Details: leans right and post with standing, significant balance deficits noted.   Stairs             Wheelchair Mobility    Modified Rankin (Stroke Patients Only)       Balance Overall balance assessment: Needs assistance;History of Falls Sitting-balance support: Feet supported Sitting balance-Leahy Scale: Poor Sitting balance - Comments: R lateral and posterior leaning Postural control: Posterior lean;Right lateral  lean Standing balance support: Bilateral upper extremity supported Standing balance-Leahy Scale: Poor Standing balance comment: R lateral and posterior leaning                            Cognition Arousal/Alertness: Awake/alert Behavior During Therapy: WFL for tasks assessed/performed Overall Cognitive Status: Within Functional Limits for tasks assessed                                  General Comments: Lack of insight over balance and safety deficits      Exercises Other Exercises Other Exercises: standing ex with RW and +2 support for balance due to increasing right lean.  marches and SLR x 10 BLE    General Comments        Pertinent Vitals/Pain Pain Assessment: No/denies pain    Home Living                      Prior Function            PT Goals (current goals can now be found in the care plan section) Progress towards PT goals: Progressing toward goals    Frequency    7X/week      PT Plan Current plan remains appropriate    Co-evaluation              AM-PAC PT "6 Clicks" Mobility   Outcome Measure  Help needed turning from your back to your side while in a flat bed without using bedrails?: A Little Help needed moving from lying on your back to sitting on the side of a flat bed without using bedrails?: A Little Help needed moving to and from a bed to a chair (including a wheelchair)?: A Lot Help needed standing up from a chair using your arms (e.g., wheelchair or bedside chair)?: A Lot Help needed to walk in hospital room?: A Lot Help needed climbing 3-5 steps with a railing? : Total 6 Click Score: 13    End of Session Equipment Utilized During Treatment: Gait belt Activity Tolerance: Patient tolerated treatment well Patient left: in chair;with call bell/phone within reach;with chair alarm set Nurse Communication: Mobility status;Other (comment)       Time: 7482-7078 PT Time Calculation (min) (ACUTE ONLY): 22 min  Charges:  $Gait Training: 8-22 mins                    Chesley Noon, PTA 03/17/19, 10:21 AM

## 2019-03-17 NOTE — Progress Notes (Signed)
Pd started 

## 2019-03-18 ENCOUNTER — Inpatient Hospital Stay: Payer: Medicare PPO

## 2019-03-18 DIAGNOSIS — I639 Cerebral infarction, unspecified: Secondary | ICD-10-CM

## 2019-03-18 DIAGNOSIS — E1169 Type 2 diabetes mellitus with other specified complication: Secondary | ICD-10-CM

## 2019-03-18 LAB — GLUCOSE, CAPILLARY
Glucose-Capillary: 146 mg/dL — ABNORMAL HIGH (ref 70–99)
Glucose-Capillary: 233 mg/dL — ABNORMAL HIGH (ref 70–99)
Glucose-Capillary: 122 mg/dL — ABNORMAL HIGH (ref 70–99)
Glucose-Capillary: 109 mg/dL — ABNORMAL HIGH (ref 70–99)

## 2019-03-18 MED ORDER — CALCITRIOL 0.25 MCG PO CAPS
0.5000 ug | ORAL_CAPSULE | Freq: Every day | ORAL | Status: DC
  Administered 2019-03-18 – 2019-03-20 (×3): 0.5 ug via ORAL
  Filled 2019-03-18 (×3): qty 2

## 2019-03-18 MED ORDER — AMLODIPINE BESYLATE 5 MG PO TABS
2.5000 mg | ORAL_TABLET | Freq: Every day | ORAL | Status: DC
  Administered 2019-03-18 – 2019-03-20 (×3): 2.5 mg via ORAL
  Filled 2019-03-18 (×3): qty 1

## 2019-03-18 MED ORDER — RENA-VITE PO TABS
1.0000 | ORAL_TABLET | Freq: Every day | ORAL | Status: DC
  Administered 2019-03-18 – 2019-03-20 (×3): 1 via ORAL
  Filled 2019-03-18 (×3): qty 1

## 2019-03-18 MED ORDER — NEPRO/CARBSTEADY PO LIQD
237.0000 mL | Freq: Two times a day (BID) | ORAL | Status: DC
  Administered 2019-03-18 – 2019-03-20 (×3): 237 mL via ORAL

## 2019-03-18 MED ORDER — GABAPENTIN 100 MG PO CAPS
100.0000 mg | ORAL_CAPSULE | Freq: Two times a day (BID) | ORAL | Status: DC
  Administered 2019-03-18 – 2019-03-20 (×5): 100 mg via ORAL
  Filled 2019-03-18 (×5): qty 1

## 2019-03-18 NOTE — Progress Notes (Addendum)
Inpatient Rehabilitation Admissions Coordinator  I await insurance decision on a possible inpt rehab admit at [facility]. No bed is available for this patient today.  Danne Baxter, RN, MSN Rehab Admissions Coordinator [phone] 03/18/2019 1:29 PM   I have insurance approval to admit pt to inpt rehab. Admission pending bed availability.  I have updated wife by phone. I iwll follow up with acute team tomorrow.  Danne Baxter, RN, MSN Rehab Admissions Coordinator [phone] 03/18/2019 2:44 PM

## 2019-03-18 NOTE — Progress Notes (Addendum)
Just received a call from the hospital operator stating that someone claiming to be related to the patient has called her multiple times and demanded to speak to the "hospital president", but the individual won't state why or what he/she is so concerned about and doesn't want to talk to anybody else. Also refuses to leave a call back number with her. Operator states that this person also claims to have been in "upper management" here at some point. Apparently has hung up on the operator several times as well when his/her demands aren't met. Kyle [name] [facility]

## 2019-03-18 NOTE — Progress Notes (Signed)
Physical Therapy Treatment Patient Details Name: Kyle [name] MRN: [id number] DOB: [birth year] Today's Date: 03/18/2019    History of Present Illness Pt admitted for hypocalcemia. History includes ESRD on peritoneal dialysis, COPD, HTN, and seizures. Pt currently receiving HHPT, however has been declining physically and reports multiple falls. Of note: pt with new onset ptosis, MRI revealed acute L midbrain infarct.    PT Comments    Pt motivated and actively participated throughout the session.  Pt demonstrated fair functional strength during the session but remained limited by ataxia and deficits in balance.  These deficits combined with pt being impulsive during the session result in pt being at very high risk for falls.  Pt given max verbal cues for amb closer to the RW with little carryover noted.  Pt's SpO2 and HR were WNL during the session with no adverse symptoms noted other than general fatigue after amb.  Pt will benefit from CIR upon discharge to safely address deficits listed in patient problem list for decreased caregiver assistance and eventual return to PLOF.     Follow Up Recommendations  CIR     Equipment Recommendations  None recommended by PT    Recommendations for Other Services       Precautions / Restrictions Precautions Precautions: Fall Restrictions Weight Bearing Restrictions: No    Mobility  Bed Mobility Overal bed mobility: Needs Assistance Bed Mobility: Supine to Sit;Sit to Supine     Supine to sit: HOB elevated;Supervision Sit to supine: Supervision   General bed mobility comments: Cues for positioning/sequencing and extra effort required  Transfers Overall transfer level: Needs assistance Equipment used: Rolling walker (2 wheeled) Transfers: Sit to/from Stand Sit to Stand: +2 physical assistance;Min assist;Mod assist         General transfer comment: Min to Mod A for stability upon standing  Ambulation/Gait Ambulation/Gait  assistance: Mod assist;+2 physical assistance Gait Distance (Feet): 30 Feet x 1, 20 Feet x 1 Assistive device: Rolling walker (2 wheeled) Gait Pattern/deviations: Decreased step length - right;Decreased step length - left;Step-through pattern;Wide base of support;Drifts right/left;Ataxic Gait velocity: decreased   General Gait Details: Pt impulsive with amb with Mod A for stability and max verbal cues for amb closer to RW with upright posture   Stairs             Wheelchair Mobility    Modified Rankin (Stroke Patients Only)       Balance Overall balance assessment: Needs assistance;History of Falls Sitting-balance support: Feet supported;Single extremity supported;Bilateral upper extremity supported Sitting balance-Leahy Scale: Fair Sitting balance - Comments: Min verbal cues for neutral sitting position but grossly improved from prior sessions   Standing balance support: Bilateral upper extremity supported;During functional activity Standing balance-Leahy Scale: Poor Standing balance comment: Mod A for stability                            Cognition Arousal/Alertness: Awake/alert Behavior During Therapy: Impulsive;Restless Overall Cognitive Status: No family/caregiver present to determine baseline cognitive functioning                                 General Comments: Lack of insight over balance and safety deficits      Exercises Total Joint Exercises Ankle Circles/Pumps: AROM;Strengthening;Both;10 reps Heel Slides: Strengthening;Both;10 reps Hip ABduction/ADduction: Strengthening;Both;10 reps Straight Leg Raises: Strengthening;10 reps;Both Long Arc Quad: Strengthening;Both;10 reps Knee Flexion: Strengthening;Both;10 reps Marching in  Standing: AROM;Both;5 reps;10 reps;Standing    General Comments        Pertinent Vitals/Pain Pain Assessment: No/denies pain    Home Living                      Prior Function             PT Goals (current goals can now be found in the care plan section) Progress towards PT goals: Progressing toward goals    Frequency    7X/week      PT Plan Discharge plan needs to be updated    Co-evaluation              AM-PAC PT "6 Clicks" Mobility   Outcome Measure  Help needed turning from your back to your side while in a flat bed without using bedrails?: A Little Help needed moving from lying on your back to sitting on the side of a flat bed without using bedrails?: A Little Help needed moving to and from a bed to a chair (including a wheelchair)?: A Lot Help needed standing up from a chair using your arms (e.g., wheelchair or bedside chair)?: A Lot Help needed to walk in hospital room?: A Lot Help needed climbing 3-5 steps with a railing? : Total 6 Click Score: 13    End of Session Equipment Utilized During Treatment: Gait belt Activity Tolerance: Patient tolerated treatment well Patient left: in bed;with bed alarm set;with call bell/phone within reach;Other (comment)(SLP with pt) Nurse Communication: Mobility status PT Visit Diagnosis: Unsteadiness on feet (R26.81);Repeated falls (R29.6);Muscle weakness (generalized) (M62.81);History of falling (Z91.81);Difficulty in walking, not elsewhere classified (R26.2)     Time: 6283-6629 PT Time Calculation (min) (ACUTE ONLY): 23 min  Charges:  $Gait Training: 8-22 mins $Therapeutic Exercise: 8-22 mins                     D. Scott Everest Hacking PT, DPT 03/18/19, 3:16 PM

## 2019-03-18 NOTE — Plan of Care (Signed)
  Problem: Education: Goal: Knowledge of General Education information will improve Description: Including pain rating scale, medication(s)/side effects and non-pharmacologic comfort measures Outcome: Not Progressing Note: Patient is intermittently confused with sporadic episodes of hallucinations. MRI of the brain revealed both an old and recent infarct. Followed by a neurologist. Patient to go to CIR, hopefully as soon as the AM. Insurance approval has been obtained. Will continue to monitor discharge progression for the remainder of the shift. Wenda Low [facility]

## 2019-03-18 NOTE — Progress Notes (Addendum)
Asked patient about concerns in prior note and he states that this person may be his brother Dominica Severin. He states it's OK to share his PHI with Dominica Severin, but he's not in the contact list. Asked patient what Gary's phone number is, he states he doesn't know and was unable to find it in his cell phone. Daron Offer   Spoke to unit director, Dr. Lanny Cramp, who stated that she spoke with both the patient and his wife, [name], and that [name] wants an update for today from Dr. Leslye Peer. Will request update from Dr. Leslye Peer via secure chat. Number already listed in the contact list. Will continue to monitor. Wenda Low [facility]

## 2019-03-18 NOTE — Progress Notes (Signed)
[place], [place] 03/18/19  Subjective:   Hospital day # 5  Denies any acute shortness of breath Nursing staff do not report any acute events No problems reported with PD overnight  Renal: 01/19 0701 - 01/20 0700 In: 120 [P.O.:120] Out: 0  Lab Results  Component Value Date   CREATININE 9.22 (H) 03/16/2019   CREATININE 9.39 (H) 03/14/2019   CREATININE 10.37 (H) 03/13/2019     Objective:  Vital signs in last 24 hours:  Temp:  [97.5 F (36.4 C)-97.7 F (36.5 C)] 97.5 F (36.4 C) (01/20 0759) Pulse Rate:  [61-66] 61 (01/20 0759) Resp:  [16-19] 16 (01/20 0759) BP: (114-137)/(66-90) 134/69 (01/20 0759) SpO2:  [99 %-100 %] 99 % (01/20 0759) Weight:  [136.5 kg] 136.5 kg (01/20 0516)  Weight change:  Filed Weights   03/12/19 1924 03/15/19 0530 03/18/19 0516  Weight: 93.9 kg (!) 201.6 kg (!) 136.5 kg    Intake/Output:    Intake/Output Summary (Last 24 hours) at 03/18/2019 1131 Last data filed at 03/18/2019 0754 Gross per 24 hour  Intake --  Output 0 ml  Net 0 ml     Physical Exam: General:  No acute distress, laying in the bed  HEENT  anicteric, moist oral mucous membranes, left ptosis  Pulm/lungs  normal breathing effort, clear to auscultation  CVS/Heart  no rub or gallop  Abdomen:   Soft, nontender, PD catheter in place  Extremities:  No edema  Neurologic:  Alert, oriented to self  Skin:  No acute rashes  Access:  PD catheter       Basic Metabolic Panel:  Recent Labs  Lab 03/12/19 1947 03/12/19 1947 03/13/19 0436 03/14/19 0623 03/16/19 1443  NA 135  --  135 137 141  K 3.8  --  3.9 3.6 3.5  CL 103  --  106 102 102  CO2 14*  --  17* 19* 24  GLUCOSE 136*  --  107* 233* 73  BUN 67*  --  66* 59* 50*  CREATININE 10.31*  --  10.37* 9.39* 9.22*  CALCIUM 5.6*   < > 5.6* 6.5* 8.1*  MG 1.4*  --   --  1.8  --   PHOS 9.1*  --   --  9.0* 9.5*   < > = values in this interval not displayed.     CBC: Recent Labs  Lab  03/12/19 1947 03/13/19 0436 03/14/19 0623  WBC 8.1 7.2 5.6  NEUTROABS 6.0  --   --   HGB 9.0* 8.1* 8.3*  HCT 26.5* 24.0* 24.9*  MCV 95.7 96.0 96.5  PLT 209 183 178     No results found for: HEPBSAG, HEPBSAB, HEPBIGM    Microbiology:  Recent Results (from the past 240 hour(s))  SARS CORONAVIRUS 2 (TAT 6-24 HRS) Nasopharyngeal Nasopharyngeal Swab     Status: None   Collection Time: 03/12/19  9:31 PM   Specimen: Nasopharyngeal Swab  Result Value Ref Range Status   SARS Coronavirus 2 NEGATIVE NEGATIVE Final    Comment: (NOTE) SARS-CoV-2 target nucleic acids are NOT DETECTED. The SARS-CoV-2 RNA is generally detectable in upper and lower respiratory specimens during the acute phase of infection. Negative results do not preclude SARS-CoV-2 infection, do not rule out co-infections with other pathogens, and should not be used as the sole basis for treatment or other patient management decisions. Negative results must be combined with clinical observations, patient history, and epidemiological information. The expected result is Negative. Fact Sheet for  Patients: SugarRoll.be Fact Sheet for Healthcare Providers: https://www.woods-mathews.com/ This test is not yet approved or cleared by the [place] FDA and  has been authorized for detection and/or diagnosis of SARS-CoV-2 by FDA under an Emergency Use Authorization (EUA). This EUA will remain  in effect (meaning this test can be used) for the duration of the COVID-19 declaration under Section 56 4(b)(1) of the Act, 21 U.S.C. section 360bbb-3(b)(1), unless the authorization is terminated or revoked sooner. Performed at [facility], [place] [address] [place], [place] [address]     Coagulation Studies: No results for input(s): LABPROT, INR in the last 72 hours.  Urinalysis: No results for input(s): COLORURINE, LABSPEC, PHURINE, GLUCOSEU, HGBUR, BILIRUBINUR, KETONESUR,  PROTEINUR, UROBILINOGEN, NITRITE, LEUKOCYTESUR in the last 72 hours.  Invalid input(s): APPERANCEUR    Imaging: CT HEAD WO CONTRAST  Result Date: 03/17/2019 CLINICAL DATA:  69 year old male with stroke follow-up. EXAM: CT HEAD WITHOUT CONTRAST TECHNIQUE: Contiguous axial images were obtained from the base of the skull through the vertex without intravenous contrast. COMPARISON:  Head CT dated 03/13/2019 and MRI dated 03/15/2019 FINDINGS: Brain: There is mild age-related atrophy and chronic microvascular ischemic changes. Expected evolution of the left paramedian midbrain infarct (series 2, image 12). No hemorrhagic conversion. 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 There is no acute intracranial hemorrhage. No mass effect or midline shift. No extra-axial fluid collection. Vascular: No hyperdense vessel or unexpected calcification. Skull: Normal. Negative for fracture or focal lesion. Sinuses/Orbits: Chronic opacification the left maxillary sinus with inspissated mucous. The remainder of the visualized paranasal sinuses and mastoid air cells are clear. No air-fluid level. Other: None IMPRESSION: 1. No acute intracranial hemorrhage. Interval expected evolution of the previously seen mid brain infarct. No hemorrhagic conversion. 2. Age-related atrophy and chronic microvascular ischemic changes. Electronically Signed   By: Anner Crete M.D.   On: 03/17/2019 19:01   ECHOCARDIOGRAM COMPLETE  Result Date: 03/16/2019   ECHOCARDIOGRAM REPORT   Patient Name:   Kyle [name] Date of Exam: 03/16/2019 Medical Rec #:  [id number]          Height:       72.0 in Accession #:    [id number]         Weight:       444.4 lb Date of Birth:  1950-10-18          BSA:          2.99 m Patient Age:    17 years           BP:           127/66 mmHg Patient Gender: M                  HR:           63 bpm. Exam Location:  [facility] Procedure: 2D Echo, Cardiac Doppler and Color Doppler Indications:     Stroke 434.91  History:          Patient has no prior history of Echocardiogram examinations.                  COPD; Risk Factors:Hypertension and Diabetes. CKD.  Sonographer:     Sherrie Sport RDCS (AE) Referring Phys:  [id number] BRENDA MORRISON Diagnosing Phys: Bartholome Bill MD  Sonographer Comments: Technically difficult study due to poor echo windows. Image acquisition challenging due to respiratory motion, Image acquisition challenging due to COPD and All views are modified, non- standard views. IMPRESSIONS  1. Left ventricular ejection fraction, by visual estimation, is 65 to 70%. The left ventricle has normal function. Left ventricular septal wall thickness was mildly increased. Mildly increased left ventricular posterior wall thickness. There is mildly increased left ventricular hypertrophy.  2. Left ventricular diastolic parameters are consistent with Grade I diastolic dysfunction (impaired relaxation).  3. The left ventricle has no regional wall motion abnormalities.  4. Global right ventricle was not well visualized.The right ventricular size is normal. No increase in right ventricular wall thickness.  5. Left atrial size was normal.  6. Right atrial size was normal.  7. The mitral valve was not well visualized. Trivial mitral valve regurgitation.  8. The tricuspid valve is not well visualized.  9. The aortic valve was not well visualized. Aortic valve regurgitation is not visualized. 10. The pulmonic valve was not well visualized. Pulmonic valve regurgitation is not visualized. 11. The aortic root was not well visualized. 12. The interatrial septum was not well visualized. FINDINGS  Left Ventricle: Left ventricular ejection fraction, by visual estimation, is 65 to 70%. The left ventricle has normal function. The left ventricle has no regional wall motion abnormalities. Mildly increased left ventricular posterior wall thickness. There is mildly increased left ventricular hypertrophy. Left ventricular diastolic parameters are consistent  with Grade I diastolic dysfunction (impaired relaxation). Right Ventricle: The right ventricular size is normal. No increase in right ventricular wall thickness. Global RV systolic function is was not well visualized. Left Atrium: Left atrial size was normal in size. Right Atrium: Right atrial size was normal in size Pericardium: There is no evidence of pericardial effusion. Mitral Valve: The mitral valve was not well visualized. Trivial mitral valve regurgitation. Tricuspid Valve: The tricuspid valve is not well visualized. Tricuspid valve regurgitation is mild. Aortic Valve: The aortic valve was not well visualized. Aortic valve regurgitation is not visualized. Pulmonic Valve: The pulmonic valve was not well visualized. Pulmonic valve regurgitation is not visualized. Pulmonic regurgitation is not visualized. Aorta: The aortic root was not well visualized. IAS/Shunts: The interatrial septum was not well visualized.  LEFT VENTRICLE PLAX 2D LVIDd:         4.59 cm LVIDs:         2.82 cm LV PW:         1.32 cm LV IVS:        1.16 cm LVOT diam:     2.10 cm LV SV:         67 ml LV SV Index:   20.11 LVOT Area:     3.46 cm  LEFT ATRIUM         Index LA diam:    3.60 cm 1.20 cm/m                        PULMONIC VALVE AORTA                 RVOT Peak grad: 2 mmHg Ao Root diam: 3.70 cm   SHUNTS Systemic Diam: 2.10 cm  Bartholome Bill MD Electronically signed by Bartholome Bill MD Signature Date/Time: 03/16/2019/12:48:59 PM    Final      Medications:   . dialysis solution 1.5% low-MG/low-CA Stopped (03/18/19 1126)   . amLODipine  2.5 mg Oral Daily  . aspirin EC  81 mg Oral Daily  . atorvastatin  80 mg Oral Daily  . calcium acetate  1,334 mg Oral TID WC  . calcium carbonate  1,250 mg Oral TID WC  .  clopidogrel  75 mg Oral Daily  . epoetin (EPOGEN/PROCRIT) injection  10,000 Units Subcutaneous Weekly  . feeding supplement (NEPRO CARB STEADY)  237 mL Oral BID BM  . gabapentin  100 mg Oral BID  . gentamicin cream  1  application Topical Daily  . heparin  5,000 Units Subcutaneous Q8H  . insulin aspart  0-15 Units Subcutaneous TID WC  . levETIRAcetam  500 mg Oral BID  . levothyroxine  200 mcg Oral q morning - 10a  . lipase/protease/amylase  24,000 Units Oral TID  . losartan  100 mg Oral Daily  . metoprolol succinate  50 mg Oral Daily  . multivitamin  1 tablet Oral QHS  . sodium bicarbonate  650 mg Oral BID     Assessment/ Plan:  70 y.o. male with end stage renal disease on peritoneal dialysis, hypertension, diabetes mellitus type II, seizure disorder, nephrolithiasis, hypothyroidism, hyperlipidemia, depression, BPH, COPD  admitted on 03/12/2019 for Hypocalcemia [E83.51] Hypomagnesemia [E83.42] Prolonged Q-T interval on ECG [R94.31] Generalized weakness [R53.1]  [facility] 94kg CCPD 9 hours 5 exchanges 2561mL fills  Dry during daytime  #End-stage renal disease At present, continue CCPD based on home regimen Discharge planning: If patient is sent to SNF/rehab, he will need to convert to hemodialysis with PermCath placement Physical therapist reported that patient is too weak and may need rehab placement. 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 Any  transcription errors are unintentional

## 2019-03-18 NOTE — Progress Notes (Signed)
Patient ID: Kyle [name], male   DOB: [birth year], 69 y.o.   MRN: [id number] [facility] PROGRESS NOTE  Kyle [name] HER:[id number] DOB: 1950/11/22 DOA: 03/12/2019 PCP: Kyle Crouch, MD  HPI/Subjective: Patient feels okay. Feels her strength is okay. Left eye drooping since he has been here.  Objective: Vitals:   03/18/19 0759 03/18/19 1620  BP: 134/69 (!) 126/59  Pulse: 61 62  Resp: 16 16  Temp: (!) 97.5 F (36.4 C) 97.7 F (36.5 C)  SpO2: 99% 99%    Intake/Output Summary (Last 24 hours) at 03/18/2019 1710 Last data filed at 03/18/2019 0845 Gross per 24 hour  Intake --  Output 678 ml  Net -678 ml   Filed Weights   03/12/19 1924 03/15/19 0530 03/18/19 0516  Weight: 93.9 kg (!) 201.6 kg (!) 136.5 kg    ROS: Review of Systems  Constitutional: Negative for chills and fever.  Eyes: Negative for blurred vision.  Respiratory: Negative for cough and shortness of breath.   Cardiovascular: Negative for chest pain.  Gastrointestinal: Negative for abdominal pain, constipation, diarrhea, nausea and vomiting.  Genitourinary: Negative for dysuria.  Musculoskeletal: Negative for joint pain.  Neurological: Negative for dizziness and headaches.   Exam: Physical Exam  HENT:  Nose: No mucosal edema.  Mouth/Throat: No oropharyngeal exudate or posterior oropharyngeal edema.  Eyes: Pupils are equal, round, and reactive to light. Conjunctivae, EOM and lids are normal.  Neck: No JVD present. Carotid bruit is not present. No thyroid mass and no thyromegaly present.  Cardiovascular: S1 normal and S2 normal. Exam reveals no gallop.  No murmur heard. Respiratory: No respiratory distress. He has decreased breath sounds in the right lower field and the left lower field. He has no wheezes. He has no rhonchi. He has no rales.  GI: Soft. Bowel sounds are normal. There is no abdominal tenderness.  Musculoskeletal:     Cervical back: No edema.     Right ankle: No swelling.      Left ankle: No swelling.  Lymphadenopathy:    He has no cervical adenopathy.  Neurological: He is alert. No cranial nerve deficit.  Skin: Skin is warm. No rash noted. Nails show no clubbing.  Psychiatric: He has a normal mood and affect.      Data Reviewed: Basic Metabolic Panel: Recent Labs  Lab 03/12/19 1947 03/13/19 0436 03/14/19 0623 03/16/19 1443  NA 135 135 137 141  K 3.8 3.9 3.6 3.5  CL 103 106 102 102  CO2 14* 17* 19* 24  GLUCOSE 136* 107* 233* 73  BUN 67* 66* 59* 50*  CREATININE 10.31* 10.37* 9.39* 9.22*  CALCIUM 5.6* 5.6* 6.5* 8.1*  MG 1.4*  --  1.8  --   PHOS 9.1*  --  9.0* 9.5*   Liver Function Tests: Recent Labs  Lab 03/13/19 0436 03/14/19 0623 03/16/19 1443  AST 14* 14*  --   ALT 9 10  --   ALKPHOS 86 92  --   BILITOT 0.7 0.6  --   PROT 5.1* 5.2*  --   ALBUMIN 2.1* 2.1* 2.4*   CBC: Recent Labs  Lab 03/12/19 1947 03/13/19 0436 03/14/19 0623  WBC 8.1 7.2 5.6  NEUTROABS 6.0  --   --   HGB 9.0* 8.1* 8.3*  HCT 26.5* 24.0* 24.9*  MCV 95.7 96.0 96.5  PLT 209 183 178    CBG: Recent Labs  Lab 03/17/19 1650 03/17/19 2111 03/18/19 0801 03/18/19 1315 03/18/19 1625  GLUCAP 148* 156* 146*  233* 122*    Recent Results (from the past 240 hour(s))  SARS CORONAVIRUS 2 (TAT 6-24 HRS) Nasopharyngeal Nasopharyngeal Swab     Status: None   Collection Time: 03/12/19  9:31 PM   Specimen: Nasopharyngeal Swab  Result Value Ref Range Status   SARS Coronavirus 2 NEGATIVE NEGATIVE Final    Comment: (NOTE) SARS-CoV-2 target nucleic acids are NOT DETECTED. The SARS-CoV-2 RNA is generally detectable in upper and lower respiratory specimens during the acute phase of infection. Negative results do not preclude SARS-CoV-2 infection, do not rule out co-infections with other pathogens, and should not be used as the sole basis for treatment or other patient management decisions. Negative results must be combined with clinical observations, patient history, and  epidemiological information. The expected result is Negative. Fact Sheet for Patients: SugarRoll.be Fact Sheet for Healthcare Providers: https://www.woods-mathews.com/ This test is not yet approved or cleared by the [place] FDA and  has been authorized for detection and/or diagnosis of SARS-CoV-2 by FDA under an Emergency Use Authorization (EUA). This EUA will remain  in effect (meaning this test can be used) for the duration of the COVID-19 declaration under Section 56 4(b)(1) of the Act, 21 U.S.C. section 360bbb-3(b)(1), unless the authorization is terminated or revoked sooner. Performed at [facility], [place] [address], [place], [place] [address]      Studies: CT HEAD WO CONTRAST  Result Date: 03/17/2019 CLINICAL DATA:  69 year old male with stroke follow-up. EXAM: CT HEAD WITHOUT CONTRAST TECHNIQUE: Contiguous axial images were obtained from the base of the skull through the vertex without intravenous contrast. COMPARISON:  Head CT dated 03/13/2019 and MRI dated 03/15/2019 FINDINGS: Brain: There is mild age-related atrophy and chronic microvascular ischemic changes. Expected evolution of the left paramedian midbrain infarct (series 2, image 12). No hemorrhagic conversion. 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 There is no acute intracranial hemorrhage. No mass effect or midline shift. No extra-axial fluid collection. Vascular: No hyperdense vessel or unexpected calcification. Skull: Normal. Negative for fracture or focal lesion. Sinuses/Orbits: Chronic opacification the left maxillary sinus with inspissated mucous. The remainder of the visualized paranasal sinuses and mastoid air cells are clear. No air-fluid level. Other: None IMPRESSION: 1. No acute intracranial hemorrhage. Interval expected evolution of the previously seen mid brain infarct. No hemorrhagic conversion. 2. Age-related atrophy and chronic microvascular ischemic  changes. Electronically Signed   By: Anner Crete M.D.   On: 03/17/2019 19:01   CT CHEST WO CONTRAST  Result Date: 03/18/2019 CLINICAL DATA:  Cough. EXAM: CT CHEST WITHOUT CONTRAST TECHNIQUE: Multidetector CT imaging of the chest was performed following the standard protocol without IV contrast. COMPARISON:  November 05, 2016. FINDINGS: Cardiovascular: Atherosclerosis of thoracic aorta is noted without aneurysm formation. Normal cardiac size. No pericardial effusion. Coronary artery calcifications are noted. Mediastinum/Nodes: No enlarged mediastinal or axillary lymph nodes. Status post thyroidectomy. The trachea and esophagus demonstrate no significant findings. Lungs/Pleura: Lungs are clear. No pleural effusion or pneumothorax. Upper Abdomen: Cholelithiasis is noted. Musculoskeletal: No chest wall mass or suspicious bone lesions identified. IMPRESSION: Status post thyroidectomy. Cholelithiasis. Coronary artery calcifications are noted. No acute abnormality seen in the chest. Aortic Atherosclerosis (ICD10-I70.0). Electronically Signed   By: Marijo Conception M.D.   On: 03/18/2019 12:19    Scheduled Meds: . amLODipine  2.5 mg Oral Daily  . aspirin EC  81 mg Oral Daily  . atorvastatin  80 mg Oral Daily  . calcitRIOL  0.5 mcg Oral Daily  .  calcium acetate  1,334 mg Oral TID WC  . calcium carbonate  1,250 mg Oral TID WC  . clopidogrel  75 mg Oral Daily  . epoetin (EPOGEN/PROCRIT) injection  10,000 Units Subcutaneous Weekly  . feeding supplement (NEPRO CARB STEADY)  237 mL Oral BID BM  . gabapentin  100 mg Oral BID  . gentamicin cream  1 application Topical Daily  . heparin  5,000 Units Subcutaneous Q8H  . insulin aspart  0-15 Units Subcutaneous TID WC  . levETIRAcetam  500 mg Oral BID  . levothyroxine  200 mcg Oral q morning - 10a  . lipase/protease/amylase  24,000 Units Oral TID  . losartan  100 mg Oral Daily  . metoprolol succinate  50 mg Oral Daily  . multivitamin  1 tablet Oral QHS  .  sodium bicarbonate  650 mg Oral BID   Continuous Infusions: . dialysis solution 1.5% low-MG/low-CA Stopped (03/18/19 1126)    Assessment/Plan:  1. Acute left midbrain stroke. Patient has difficulty with balance, ptosis left eyelid. Patient on aspirin and Plavix. We will hopefully be able to go over to acute rehab tomorrow or Friday pending bed availability. CT chest negative for Horner syndrome. Likely will need ophthalmology appointment as outpatient. 2. End-stage renal disease on peritoneal dialysis as per nephrology 3. Hypocalcemia, hypophosphatemia and hypomagnesemia. 4. Type 2 diabetes with hyperlipidemia on statin. LDL at goal 48. On sliding scale. Last hemoglobin A1c 7.1. Diet controlled 5. Seizure disorder on Keppra 6. Hypothyroidism unspecified on levothyroxine 7. Essential hypertension on losartan and metoprolol and Norvasc  Code Status:     Code Status Orders  (From admission, onward)         Start     Ordered   03/12/19 2211  Full code  Continuous     03/12/19 2211        Code Status History    Date Active Date Inactive Code Status Order ID Comments User Context   09/16/2018 2349 09/18/2018 1913 Full Code [id number]  Mayer Camel, NP ED   Advance Care Planning Activity    Advance Directive Documentation     Most Recent Value  Type of Advance Directive  Healthcare Power of Attorney  Pre-existing out of facility DNR order (yellow form or pink MOST form)  --  "MOST" Form in Place?  --     Family Communication: Spoke with the patient's wife on the phone while I was in the room with the patient Disposition Plan: To be determined  Consultants:  Neurology  Nephrology  Time spent: 28 minutes  [place]

## 2019-03-18 NOTE — Progress Notes (Signed)
Subjective: Patient without complaints.  Yesterday noted by nursing to have worsening ptosis and difficulty controlling RUE.  Repeat head CT showed no acute changes.    Objective: Current vital signs: BP 134/69 (BP Location: Right Arm)   Pulse 61   Temp (!) 97.5 F (36.4 C) (Oral)   Resp 16   Ht 6' (1.829 m)   Wt (!) 136.5 kg   SpO2 99%   BMI 40.82 kg/m  Vital signs in last 24 hours: Temp:  [97.5 F (36.4 C)-97.7 F (36.5 C)] 97.5 F (36.4 C) (01/20 0759) Pulse Rate:  [61-66] 61 (01/20 0759) Resp:  [16-19] 16 (01/20 0759) BP: (114-137)/(66-90) 134/69 (01/20 0759) SpO2:  [99 %-100 %] 99 % (01/20 0759) Weight:  [136.5 kg] 136.5 kg (01/20 0516)  Intake/Output from previous day: 01/19 0701 - 01/20 0700 In: 120 [P.O.:120] Out: 0  Intake/Output this shift: No intake/output data recorded. Nutritional status:  Diet Order            Diet renal with fluid restriction Fluid restriction: 1200 mL Fluid; Room service appropriate? Yes with Assist; Fluid consistency: Nectar Thick  Diet effective now              Neurologic Exam: Mental Status: Alertand awake. Oriented to person, where he is and the year.  Speech fluent without evidence of aphasia. Able tofollow commands.  Cranial Nerves: II: Visual fields grossly normal, pupilsunequal with right being larger than left. Both round, reactive to light and accommodation III,IV, CN:[id number], left CNIII palsy noted V,VII: smile symmetric, facial light touch sensation normal bilaterally VIII: hearing normal bilaterally IX,X: gag reflex present XI: bilateral shoulder shrug XII: midline tongue extension Motor: 5/5 throughout Sensory: Pinprick and light touch intact throughout, bilaterally Cerebellar: Intact finger to nose testing bilaterally  Lab Results: Basic Metabolic Panel: Recent Labs  Lab 03/12/19 1947 03/12/19 1947 03/13/19 0436 03/14/19 0623 03/16/19 1443  NA 135  --  135 137 141  K 3.8  --  3.9 3.6 3.5   CL 103  --  106 102 102  CO2 14*  --  17* 19* 24  GLUCOSE 136*  --  107* 233* 73  BUN 67*  --  66* 59* 50*  CREATININE 10.31*  --  10.37* 9.39* 9.22*  CALCIUM 5.6*   < > 5.6* 6.5* 8.1*  MG 1.4*  --   --  1.8  --   PHOS 9.1*  --   --  9.0* 9.5*   < > = values in this interval not displayed.    Liver Function Tests: Recent Labs  Lab 03/13/19 0436 03/14/19 0623 03/16/19 1443  AST 14* 14*  --   ALT 9 10  --   ALKPHOS 86 92  --   BILITOT 0.7 0.6  --   PROT 5.1* 5.2*  --   ALBUMIN 2.1* 2.1* 2.4*   No results for input(s): LIPASE, AMYLASE in the last 168 hours. No results for input(s): AMMONIA in the last 168 hours.  CBC: Recent Labs  Lab 03/12/19 1947 03/13/19 0436 03/14/19 0623  WBC 8.1 7.2 5.6  NEUTROABS 6.0  --   --   HGB 9.0* 8.1* 8.3*  HCT 26.5* 24.0* 24.9*  MCV 95.7 96.0 96.5  PLT 209 183 178    Cardiac Enzymes: No results for input(s): CKTOTAL, CKMB, CKMBINDEX, TROPONINI in the last 168 hours.  Lipid Panel: Recent Labs  Lab 03/16/19 0533  CHOL 130  TRIG 207*  HDL 41  CHOLHDL 3.2  VLDL  41*  LDLCALC 48    CBG: Recent Labs  Lab 03/17/19 0800 03/17/19 1159 03/17/19 1650 03/17/19 2111 03/18/19 0801  GLUCAP 196* 207* 148* 156* 146*    Microbiology: Results for orders placed or performed during the hospital encounter of 03/12/19  SARS CORONAVIRUS 2 (TAT 6-24 HRS) Nasopharyngeal Nasopharyngeal Swab     Status: None   Collection Time: 03/12/19  9:31 PM   Specimen: Nasopharyngeal Swab  Result Value Ref Range Status   SARS Coronavirus 2 NEGATIVE NEGATIVE Final    Comment: (NOTE) SARS-CoV-2 target nucleic acids are NOT DETECTED. The SARS-CoV-2 RNA is generally detectable in upper and lower respiratory specimens during the acute phase of infection. Negative results do not preclude SARS-CoV-2 infection, do not rule out co-infections with other pathogens, and should not be used as the sole basis for treatment or other patient management  decisions. Negative results must be combined with clinical observations, patient history, and epidemiological information. The expected result is Negative. Fact Sheet for Patients: SugarRoll.be Fact Sheet for Healthcare Providers: https://www.woods-mathews.com/ This test is not yet approved or cleared by the [place] FDA and  has been authorized for detection and/or diagnosis of SARS-CoV-2 by FDA under an Emergency Use Authorization (EUA). This EUA will remain  in effect (meaning this test can be used) for the duration of the COVID-19 declaration under Section 56 4(b)(1) of the Act, 21 U.S.C. section 360bbb-3(b)(1), unless the authorization is terminated or revoked sooner. Performed at [facility], [place] [address], [place], [place] [address]     Coagulation Studies: No results for input(s): LABPROT, INR in the last 72 hours.  Imaging: CT HEAD WO CONTRAST  Result Date: 03/17/2019 CLINICAL DATA:  69 year old male with stroke follow-up. EXAM: CT HEAD WITHOUT CONTRAST TECHNIQUE: Contiguous axial images were obtained from the base of the skull through the vertex without intravenous contrast. COMPARISON:  Head CT dated 03/13/2019 and MRI dated 03/15/2019 FINDINGS: Brain: There is mild age-related atrophy and chronic microvascular ischemic changes. Expected evolution of the left paramedian midbrain infarct (series 2, image 12). No hemorrhagic conversion. 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 There is no acute intracranial hemorrhage. No mass effect or midline shift. No extra-axial fluid collection. Vascular: No hyperdense vessel or unexpected calcification. Skull: Normal. Negative for fracture or focal lesion. Sinuses/Orbits: Chronic opacification the left maxillary sinus with inspissated mucous. The remainder of the visualized paranasal sinuses and mastoid air cells are clear. No air-fluid level. Other: None IMPRESSION: 1. No acute  intracranial hemorrhage. Interval expected evolution of the previously seen mid brain infarct. No hemorrhagic conversion. 2. Age-related atrophy and chronic microvascular ischemic changes. Electronically Signed   By: Anner Crete M.D.   On: 03/17/2019 19:01   US Carotid Bilateral  Result Date: 03/16/2019 CLINICAL DATA:  Hypertension, stroke symptoms, hyperlipidemia EXAM: BILATERAL CAROTID DUPLEX ULTRASOUND TECHNIQUE: Pearline Cables scale imaging, color Doppler and duplex ultrasound were performed of bilateral carotid and vertebral arteries in the neck. COMPARISON:  None. FINDINGS: Criteria: Quantification of carotid stenosis is based on velocity parameters that correlate the residual internal carotid diameter with NASCET-based stenosis levels, using the diameter of the distal internal carotid lumen as the denominator for stenosis measurement. The following velocity measurements were obtained: RIGHT ICA: 108/28 cm/sec CCA: 38/75 cm/sec SYSTOLIC ICA/CCA RATIO:  1.6 ECA: 168 cm/sec LEFT ICA: 86/22 cm/sec CCA: 64/33 cm/sec SYSTOLIC ICA/CCA RATIO:  1.1 ECA: 131 cm/sec RIGHT CAROTID ARTERY: Minor echogenic shadowing plaque formation. No hemodynamically significant right ICA stenosis, velocity elevation,  or turbulent flow. Degree of narrowing less than 50%. RIGHT VERTEBRAL ARTERY:  Antegrade LEFT CAROTID ARTERY: Similar scattered minor echogenic plaque formation. No hemodynamically significant left ICA stenosis, velocity elevation, or turbulent flow. LEFT VERTEBRAL ARTERY:  Antegrade IMPRESSION: Minor carotid atherosclerosis. No hemodynamically significant ICA stenosis. Degree of narrowing less than 50% bilaterally by ultrasound criteria. Patent antegrade vertebral flow bilaterally Electronically Signed   By: Jerilynn Mages.  Shick M.D.   On: 03/16/2019 10:47   ECHOCARDIOGRAM COMPLETE  Result Date: 03/16/2019   ECHOCARDIOGRAM REPORT   Patient Name:   Kyle [name] Date of Exam: 03/16/2019 Medical Rec #:  [id number]           Height:       72.0 in Accession #:    [id number]         Weight:       444.4 lb Date of Birth:  February 10, 1951          BSA:          2.99 m Patient Age:    29 years           BP:           127/66 mmHg Patient Gender: M                  HR:           63 bpm. Exam Location:  [facility] Procedure: 2D Echo, Cardiac Doppler and Color Doppler Indications:     Stroke 434.91  History:         Patient has no prior history of Echocardiogram examinations.                  COPD; Risk Factors:Hypertension and Diabetes. CKD.  Sonographer:     Sherrie Sport RDCS (AE) Referring Phys:  [id number] BRENDA MORRISON Diagnosing Phys: Bartholome Bill MD  Sonographer Comments: Technically difficult study due to poor echo windows. Image acquisition challenging due to respiratory motion, Image acquisition challenging due to COPD and All views are modified, non- standard views. IMPRESSIONS  1. Left ventricular ejection fraction, by visual estimation, is 65 to 70%. The left ventricle has normal function. Left ventricular septal wall thickness was mildly increased. Mildly increased left ventricular posterior wall thickness. There is mildly increased left ventricular hypertrophy.  2. Left ventricular diastolic parameters are consistent with Grade I diastolic dysfunction (impaired relaxation).  3. The left ventricle has no regional wall motion abnormalities.  4. Global right ventricle was not well visualized.The right ventricular size is normal. No increase in right ventricular wall thickness.  5. Left atrial size was normal.  6. Right atrial size was normal.  7. The mitral valve was not well visualized. Trivial mitral valve regurgitation.  8. The tricuspid valve is not well visualized.  9. The aortic valve was not well visualized. Aortic valve regurgitation is not visualized. 10. The pulmonic valve was not well visualized. Pulmonic valve regurgitation is not visualized. 11. The aortic root was not well visualized. 12. The interatrial septum was not well  visualized. FINDINGS  Left Ventricle: Left ventricular ejection fraction, by visual estimation, is 65 to 70%. The left ventricle has normal function. The left ventricle has no regional wall motion abnormalities. Mildly increased left ventricular posterior wall thickness. There is mildly increased left ventricular hypertrophy. Left ventricular diastolic parameters are consistent with Grade I diastolic dysfunction (impaired relaxation). Right Ventricle: The right ventricular size is normal. No increase in right ventricular wall thickness. Global RV systolic function is was  not well visualized. Left Atrium: Left atrial size was normal in size. Right Atrium: Right atrial size was normal in size Pericardium: There is no evidence of pericardial effusion. Mitral Valve: The mitral valve was not well visualized. Trivial mitral valve regurgitation. Tricuspid Valve: The tricuspid valve is not well visualized. Tricuspid valve regurgitation is mild. Aortic Valve: The aortic valve was not well visualized. Aortic valve regurgitation is not visualized. Pulmonic Valve: The pulmonic valve was not well visualized. Pulmonic valve regurgitation is not visualized. Pulmonic regurgitation is not visualized. Aorta: The aortic root was not well visualized. IAS/Shunts: The interatrial septum was not well visualized.  LEFT VENTRICLE PLAX 2D LVIDd:         4.59 cm LVIDs:         2.82 cm LV PW:         1.32 cm LV IVS:        1.16 cm LVOT diam:     2.10 cm LV SV:         67 ml LV SV Index:   20.11 LVOT Area:     3.46 cm  LEFT ATRIUM         Index LA diam:    3.60 cm 1.20 cm/m                        PULMONIC VALVE AORTA                 RVOT Peak grad: 2 mmHg Ao Root diam: 3.70 cm   SHUNTS Systemic Diam: 2.10 cm  Bartholome Bill MD Electronically signed by Bartholome Bill MD Signature Date/Time: 03/16/2019/12:48:59 PM    Final     Medications:  I have reviewed the patient's current medications. Scheduled: . amLODipine  2.5 mg Oral Daily  .  aspirin EC  81 mg Oral Daily  . atorvastatin  80 mg Oral Daily  . calcium acetate  1,334 mg Oral TID WC  . calcium carbonate  1,250 mg Oral TID WC  . clopidogrel  75 mg Oral Daily  . epoetin (EPOGEN/PROCRIT) injection  10,000 Units Subcutaneous Weekly  . gabapentin  100 mg Oral BID  . gentamicin cream  1 application Topical Daily  . heparin  5,000 Units Subcutaneous Q8H  . insulin aspart  0-15 Units Subcutaneous TID WC  . levETIRAcetam  500 mg Oral BID  . levothyroxine  200 mcg Oral q morning - 10a  . lipase/protease/amylase  24,000 Units Oral TID  . losartan  100 mg Oral Daily  . metoprolol succinate  50 mg Oral Daily  . sodium bicarbonate  650 mg Oral BID    Assessment/Plan: 69 y.o.malewith medical history significant ofseizure, ESRD on nightly PD, copd, hypertension who presented with concerns of abnormal labs.During hospitalization was noted to have a left ptosis and abnormal EOMs on the left consistent with a third cranial nerve palsy. Some right sided dysmetria noted on neurological examination as well. MRI of the brain revealed an acute left paramedian midbrain infarct. Etiology likely small vessel disease. Patient on Lipitor but no antiplatelet therapy prior to admission.  Carotid dopplers show no evidence of hemodynamically significant stenosis.  Echocardiogram shows no cardiac source of emboli with an EF of 65-70%.  LDL 48.  A1c 7.1.  BP controlled. On yesterday felt to have some worsening by nursing.  Repeat head CT with no acute changes.  Neurological examination today is stable.  Patient may very well have been experiencing some worsening due  to fatigue since symptoms were noted later in the day.  Do not suspect new ischemic event.    Recommendations: 1. Blood sugar management with target A1c<7.0 2.Continue statin 3. PT consult, OT consult, Speech consult 4. Prophylactic therapy-Dual antiplatelet therapy with ASA 81mg  and Plavix 75mg  for three weeks with change  toASA 81mg  dailyalone as monotherapy after that time. 5. Telemetry monitoring 6. Frequent neuro checks 7. Smoking cessation counseling   LOS: 5 days   Alexis Goodell, MD Neurology [phone] 03/18/2019  8:58 AM

## 2019-03-18 NOTE — Progress Notes (Addendum)
Patient's PD machine continuously beeping, saying "low drain volume". Called HD department, no one answered. Walked down to HD department, no one there. Called AC, instructed to call Dr. Candiss Norse and have him contact the PD nurse. Called Dr. Candiss Norse, who called back, informed of situation. Received call from PD nurse saying there's a phone number on the side of the machine to call and attempt to troubleshoot the problem. Will call that number now. Kyle [name]   Informed charge nurse of plan to call troubleshoot number, she stated that she and I shouldn't be comfortable with that and to verify that instruction with the [facility]. Called the [facility], she agreed that I shouldn't call that number or attempt to adjust the machine settings and to call the PD nurse back and inform her of this. Called PD nurse, informed of same, she states she will call Dr. Candiss Norse, inform of situation and ask for further instructions. Awaiting response at this time. Will also pass along in shift report. Kyle [name] (7:03P)

## 2019-03-18 NOTE — Evaluation (Signed)
Speech Language Pathology Evaluation Patient Details Name: Kyle [name] MRN: [id number] DOB: 26-Apr-1950 Today's Date: 03/18/2019 Time: 2353-6144 SLP Time Calculation (min) (ACUTE ONLY): 48 min  Problem List:  Patient Active Problem List   Diagnosis Date Noted  . PVD (peripheral vascular disease) ([place]) 03/17/2019  . Ptosis of eyelid, left 03/15/2019  . Physical deconditioning 03/14/2019  . Hypomagnesemia 03/13/2019  . Hyperphosphatemia 03/13/2019  . ESRD (end stage renal disease) ([place]) 03/13/2019  . Chronic kidney disease with peritoneal dialysis as preferred modality, stage 5 ([place]) 03/13/2019  . Prolonged QT interval 03/13/2019  . Essential hypertension 03/13/2019  . Hypothyroidism 03/13/2019  . Chronic diarrhea 03/13/2019  . Hypocalcemia 03/12/2019  . Hypokalemia 09/16/2018  . Lung nodule 06/12/2016  . Narrowing of airway   . Cigarette smoker 05/24/2016  . Collapse of right lung 05/24/2016  . COPD with chronic bronchitis ([place]) 05/24/2016  . Acne 05/20/2015  . Calculus of kidney 05/20/2015  . Chest pain, non-cardiac 05/20/2015  . Seizure ([place]) 05/20/2015  . Current tobacco use 05/20/2015  . Chronic kidney disease (CKD), stage III (moderate) 03/29/2015  . Type 2 diabetes mellitus ([place]) 03/15/2015  . Lipoma of shoulder 03/08/2014  . Other synovitis and tenosynovitis, right shoulder 03/08/2014  . Bursitis of elbow 02/15/2014  . Absolute anemia 08/08/2013  . 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 Back pain, chronic 08/08/2013  . Chronic obstructive pulmonary disease ([place]) 08/08/2013  . Diabetes mellitus ([place]) 08/08/2013  . BP (high blood pressure) 08/08/2013  . HLD (hyperlipidemia) 08/08/2013  . Acne erythematosa 08/08/2013   Past Medical History:  Past Medical History:  Diagnosis Date  . Anxiety   . BPH (benign prostatic hyperplasia)   . Chronic kidney disease   . COPD (chronic obstructive pulmonary disease) ([place])   . Depression   . Diabetes mellitus without  complication ([place])   . Hyperlipidemia   . Hypertension   . Hypothyroidism   . Nephrolithiasis   . Seizures ([place])    Past Surgical History:  Past Surgical History:  Procedure Laterality Date  . COLONOSCOPY    . COLONOSCOPY WITH PROPOFOL N/A 05/30/2015   Procedure: COLONOSCOPY WITH PROPOFOL;  Surgeon: Manya Silvas, MD;  Location: [facility] ENDOSCOPY;  Service: Endoscopy;  Laterality: N/A;  . COLONOSCOPY WITH PROPOFOL N/A 12/01/2018   Procedure: COLONOSCOPY WITH PROPOFOL;  Surgeon: Toledo, [name], MD;  Location: [facility] ENDOSCOPY;  Service: Gastroenterology;  Laterality: N/A;  . kidney stone    . THYROIDECTOMY    . VARICOCELE EXCISION    . VIDEO BRONCHOSCOPY Bilateral 05/25/2016   Procedure: VIDEO BRONCHOSCOPY WITHOUT FLUORO;  Surgeon: Juanito Doom, MD;  Location: [facility] ENDOSCOPY;  Service: Cardiopulmonary;  Laterality: Bilateral;   HPI:  Pt is a 69 y/o male admitted for hypocalcemia. Pt has PMH to include Multiple medical issues including ESRD on peritoneal dialysis daily but has "missed" sessions per pt report, COPD, HTN, and seizures, Tobacco and ETOH use, Lung R Nodule, collapse of R lung. Pt currently receiving HHPT, however has been declining physically and reports multiple falls. 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 Chronic small-vessel ischemic changes elsewhere throughout the brain.  Prior to admission per chart notes, pt reports that he has been feeling very lightheaded and weak for about the past 24 hours.  He had an episode earlier today where he was eating at a restaurant and suddenly felt very shaky, " like I  was going to have a seizure".  He reports his last seizure being over 5 years ago and did not end up having a seizure episode today, states he has been compliant with his Keppra.  He had an appointment with his GI doctor later in the day for chronic diarrhea and had lab work performed  at that time.  He then received a call this evening that his Calcium was critically low.  Per NSG report this AM, pt was coughing when trying to swallow Pills.  MRI 03/15/2019: Acute infarction of the left paramedian mid brain. No hemorrhage or  mass effect.  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 Chronic small-vessel ischemic changes elsewhere throughout the brain as outlined above.  Assessment / Plan / Recommendation Clinical Impression  This 69 year old man with acute left paramedian midbrain infarct is presenting with moderate dysarthria characterized by imprecise articulation.  The patient is able to improve speech intelligibility when cued to speak slower, louder, and more carefully.  The patient is screening with functional language, scoring 98/100 on the Western Aphasia Battery- Screening.  The patient will benefit from continued speech therapy in his next setting to address speech intelligibility as well as dysphagia.      SLP Assessment  SLP Recommendation/Assessment: Patient needs continued Speech Lanaguage Pathology Services SLP Visit Diagnosis: Dysarthria and anarthria (R47.1)    Follow Up Recommendations  Skilled Nursing facility    Frequency and Duration min 3x week         SLP Evaluation Cognition  Overall Cognitive Status: Within Functional Limits for tasks assessed       Comprehension  Auditory Comprehension Overall Auditory Comprehension: Appears within functional limits for tasks assessed    Expression Verbal Expression Overall Verbal Expression: Appears within functional limits for tasks assessed   Oral / Motor  Oral Motor/Sensory Function Overall Oral Motor/Sensory Function: Mild impairment Facial ROM: Reduced right Facial Symmetry: Abnormal symmetry right Facial Strength: Reduced right Lingual ROM: Reduced right Lingual Symmetry: Within Functional Limits Lingual Strength: Reduced Mandible: Within Functional Limits Motor Speech Overall Motor Speech:  Impaired Respiration: Within functional limits Phonation: Normal Resonance: Within functional limits Articulation: Impaired Level of Impairment: Phrase Intelligibility: Intelligibility reduced Phrase: 50-74% accurate Sentence: 25-49% accurate Conversation: 50-74% accurate   GO                   Leroy Sea, MS/CCC- SLP  Valetta Fuller, Susie 03/18/2019, 3:23 PM

## 2019-03-18 NOTE — Progress Notes (Signed)
SLP Cancellation Note  Patient Details Name: Kyle [name] MRN: [id number] DOB: 04-19-1950   Cancelled treatment:       Reason Eval/Treat Not Completed: Patient at procedure or test/unavailable(chart reviewed; pt out of room). ST services will f/u w/ pt's Language Eval for assessment of Aphasia when he is available; time permitting. NSG reported pt is tolerating the recommended dysphagia diet and aspiration precautions/strategies post MBSS yesterday.     Orinda Kenner, MS, CCC-SLP Markeeta Scalf 03/18/2019, 11:56 AM

## 2019-03-19 DIAGNOSIS — E1142 Type 2 diabetes mellitus with diabetic polyneuropathy: Secondary | ICD-10-CM

## 2019-03-19 LAB — GLUCOSE, CAPILLARY
Glucose-Capillary: 140 mg/dL — ABNORMAL HIGH (ref 70–99)
Glucose-Capillary: 262 mg/dL — ABNORMAL HIGH (ref 70–99)
Glucose-Capillary: 122 mg/dL — ABNORMAL HIGH (ref 70–99)
Glucose-Capillary: 217 mg/dL — ABNORMAL HIGH (ref 70–99)

## 2019-03-19 NOTE — Progress Notes (Signed)
Inpatient Rehabilitation Admissions Coordinator  CIR/inpt rehab bed is not available today. I will follow up tomorrow.  Danne Baxter, RN, MSN Rehab Admissions Coordinator [phone] 03/19/2019 11:49 AM

## 2019-03-19 NOTE — Progress Notes (Addendum)
   2045 - At start of shift PD machine beeping, screen reads "low drain volume." Per day shift RN, plan was to follow up with PD RN Dalbert Batman at [phone] for further instructions. After speaking with PD RN Dalbert Batman, who reports that per Dr. Candiss Norse, past practice has been for the RN caring for the patient to call [employer]'s 24 hr troubleshooting line - this RN attempted to call the number listed on the machine. A representative from [employer] walked this RN through the process of restarting the PD machine. Patient reports no adverse symptoms, denies pain. VSS. PD RN Dalbert Batman was updated by this RN.   0130 - Machine begins beeping again and reading "low drain volume." This RN again called [employer] 24 hr troubleshooting line, this time was on hold for 30 minutes. Was eventually walked through process to restart machine by tech. Patient reports no adverse signs or symptoms.

## 2019-03-19 NOTE — Progress Notes (Signed)
Physical Therapy Treatment Patient Details Name: Kyle [name] MRN: [id number] DOB: [birth year] Today's Date: 03/19/2019    History of Present Illness Pt admitted for hypocalcemia. History includes ESRD on peritoneal dialysis, COPD, HTN, and seizures. Pt currently receiving HHPT, however has been declining physically and reports multiple falls. Of note: pt with new onset ptosis, MRI revealed acute L midbrain infarct.    PT Comments    Pt motivated and actively participated throughout the session.  Pt continued to present with ataxic movements and poor standing balance.  Pt required frequent +2 assist to prevent LOB in standing and showed minimal carryover regarding proper sequencing with the RW.  Pt did temporarily amb closer to the RW with upright posture with max verbal and tactile cues to do so but quickly returned to amb with flexed trunk posture with walker out in front.  Pt somewhat impulsive and is at a very high risk for falls at this time making it unsafe to return to his prior living situation.  Pt will benefit from PT services in an IR setting upon discharge to safely address deficits listed in patient problem list for decreased caregiver assistance and eventual return to PLOF.    Follow Up Recommendations  CIR     Equipment Recommendations  None recommended by PT    Recommendations for Other Services       Precautions / Restrictions Precautions Precautions: Fall Restrictions Weight Bearing Restrictions: No    Mobility  Bed Mobility Overal bed mobility: Needs Assistance Bed Mobility: Supine to Sit     Supine to sit: Min assist     General bed mobility comments: Min A for trunk to full upright position  Transfers Overall transfer level: Needs assistance Equipment used: Rolling walker (2 wheeled) Transfers: Sit to/from Stand Sit to Stand: +2 physical assistance;Min assist;+2 safety/equipment         General transfer comment: Min A for stability upon  initial stand  Ambulation/Gait Ambulation/Gait assistance: Mod assist;+2 physical assistance Gait Distance (Feet): 30 Feet Assistive device: Rolling walker (2 wheeled) Gait Pattern/deviations: Decreased step length - right;Decreased step length - left;Step-through pattern;Wide base of support;Drifts right/left;Ataxic Gait velocity: decreased   General Gait Details: Pt impulsive with amb with Mod A for stability and max verbal cues for amb closer to RW with upright posture   Stairs             Wheelchair Mobility    Modified Rankin (Stroke Patients Only)       Balance Overall balance assessment: Needs assistance;History of Falls Sitting-balance support: Feet supported;Single extremity supported;Bilateral upper extremity supported Sitting balance-Leahy Scale: Fair Sitting balance - Comments: Min verbal cues for neutral sitting position but grossly improved from prior sessions   Standing balance support: Bilateral upper extremity supported;During functional activity Standing balance-Leahy Scale: Poor Standing balance comment: Mod A for stability                            Cognition Arousal/Alertness: Awake/alert Behavior During Therapy: Impulsive Overall Cognitive Status: Impaired/Different from baseline                                 General Comments: Lack of insight over balance and safety deficits      Exercises Total Joint Exercises Ankle Circles/Pumps: AROM;Strengthening;Both;10 reps Quad Sets: Strengthening;Both;10 reps Heel Slides: Strengthening;Both;10 reps;15 reps Hip ABduction/ADduction: Strengthening;Both;10 reps;15 reps Straight Leg Raises:  Strengthening;10 reps;Both;15 reps Long Arc Quad: Strengthening;Both;10 reps;15 reps Knee Flexion: Strengthening;Both;10 reps;15 reps Marching in Standing: AROM;Both;10 reps;Standing    General Comments        Pertinent Vitals/Pain Pain Assessment: No/denies pain    Home Living                       Prior Function            PT Goals (current goals can now be found in the care plan section) Progress towards PT goals: Progressing toward goals    Frequency    7X/week      PT Plan Current plan remains appropriate    Co-evaluation              AM-PAC PT "6 Clicks" Mobility   Outcome Measure  Help needed turning from your back to your side while in a flat bed without using bedrails?: A Little Help needed moving from lying on your back to sitting on the side of a flat bed without using bedrails?: A Little Help needed moving to and from a bed to a chair (including a wheelchair)?: A Lot Help needed standing up from a chair using your arms (e.g., wheelchair or bedside chair)?: A Lot Help needed to walk in hospital room?: A Lot Help needed climbing 3-5 steps with a railing? : Total 6 Click Score: 13    End of Session Equipment Utilized During Treatment: Gait belt Activity Tolerance: Patient tolerated treatment well Patient left: in chair;with call bell/phone within reach;with chair alarm set Nurse Communication: Mobility status PT Visit Diagnosis: Unsteadiness on feet (R26.81);Repeated falls (R29.6);Muscle weakness (generalized) (M62.81);History of falling (Z91.81);Difficulty in walking, not elsewhere classified (R26.2)     Time: 2549-8264 PT Time Calculation (min) (ACUTE ONLY): 23 min  Charges:  $Gait Training: 8-22 mins $Therapeutic Exercise: 8-22 mins                     D. Scott Doralee Kocak PT, DPT 03/19/19, 11:05 AM

## 2019-03-19 NOTE — Progress Notes (Signed)
  Speech Language Pathology Treatment: Dysphagia  Patient Details Name: Kyle [name] MRN: [id number] DOB: 1950-10-12 Today's Date: 03/19/2019 Time: 3244-0102 SLP Time Calculation (min) (ACUTE ONLY): 24 min  Assessment / Plan / Recommendation Clinical Impression  Pt received MBSS with recs for dysphagia 2 diet with nectar thick liquids. Pt is currently tolerating this diet but continually requests "real water". Treatment today to provide trails of thin liquids with use of compensatory strategies. Pt was sleeping upon ST entering and needed cues to stay awake to participate with tx. Positioned upright for optimal swallowing. Pt tolerated 10 small sips of thin water with no s/s of aspiration. Pt. needed continuous cues to swallow 2 times per sip. Vocal quality remained clear. Speech was Dysarthric. Plans for discharge to rehab as soon as bed is available. If Pt remains at [facility] past a few more days, will initiate cognitive language evaluation. ST to follow up with toleration of diet with therapeutic trails of thin as appropriate.   HPI HPI: Pt is a 69 y/o male admitted for hypocalcemia. Pt has PMH to include Multiple medical issues including ESRD on peritoneal dialysis daily but has "missed" sessions per pt report, COPD, HTN, and seizures, Tobacco and ETOH use, Lung R Nodule, collapse of R lung. Pt currently receiving HHPT, however has been declining physically and reports multiple falls. 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 Chronic small-vessel ischemic changes elsewhere throughout the brain.  Prior to admission per chart notes, pt reports that he has been feeling very lightheaded and weak for about the past 24 hours.  He had an episode earlier today where he was eating at a restaurant and suddenly felt very shaky, " like I was going to have a seizure".  He reports his last seizure being over 5 years ago and  did not end up having a seizure episode today, states he has been compliant with his Keppra.  He had an appointment with his GI doctor later in the day for chronic diarrhea and had lab work performed at that time.  He then received a call this evening that his Calcium was critically low.  Per NSG report this AM, pt was coughing when trying to swallow Pills.      SLP Plan          Recommendations  Diet recommendations: Dysphagia 2 (fine chop);Nectar-thick liquid Liquids provided via: Cup Medication Administration: Whole meds with puree Supervision: Intermittent supervision to cue for compensatory strategies Compensations: Minimize environmental distractions;Slow rate;Small sips/bites;Lingual sweep for clearance of pocketing;Multiple dry swallows after each bite/sip;Follow solids with liquid                Oral Care Recommendations: Oral care BID;Oral care before and after PO;Staff/trained caregiver to provide oral care Follow up Recommendations: [place] 03/19/2019, 3:21 PM

## 2019-03-19 NOTE — Progress Notes (Signed)
Occupational Therapy Treatment Patient Details Name: Kyle [name] MRN: [id number] DOB: [birth year] Today's Date: 03/19/2019    History of present illness Pt admitted for hypocalcemia. History includes ESRD on peritoneal dialysis, COPD, HTN, and seizures. Pt currently receiving HHPT, however has been declining physically and reports multiple falls. Of note: pt with new onset ptosis, MRI revealed acute L midbrain infarct.   OT comments  Kyle [name] was seen for OT treatment session this date. Pt seated upright in room recliner. Pt pleasant and agreeable t/o OT session, however does continue to exhibit limited safety awareness and decreased awareness of deficits. He asks this author to "bring me some regular water" despite being aware of his modified diet per SLP. This Kyle [name] provides education on adhering to the recommended thickened liquids by speech. Pt expresses understanding, but maintains dissatisfaction. Pt requires consistent cueing for safety, sequencing, and technique t/o session. OT engages pt in seated therapeutic activities to promote improved [place], bimanual hand use and hand-eye coordination as described below. Pt engages well with tasks and expresses understanding of need for regular fine motor practice to promote safety, independence, and functional return.Pt continues to benefit from skilled occupational therapy services. Will continue to follow POC as written. DC recommendation and frequency remain appropriate. Continue to recommend CIR for acute intensive rehabilitative services upon hospital DC.    Follow Up Recommendations  CIR    Equipment Recommendations  3 in 1 bedside commode    Recommendations for Other Services      Precautions / Restrictions Precautions Precautions: Fall Restrictions Weight Bearing Restrictions: No       Mobility Bed Mobility Overal bed mobility: Needs Assistance Bed Mobility: Supine to Sit     Supine to sit: Min assist     General bed  mobility comments: Deferred. Pt up in recliner at start/end of sesison.  Transfers Overall transfer level: Needs assistance Equipment used: Rolling walker (2 wheeled) Transfers: Sit to/from Stand Sit to Stand: +2 physical assistance;Min assist;+2 safety/equipment         General transfer comment: Min A for stability upon initial stand    Balance Overall balance assessment: Needs assistance;History of Falls Sitting-balance support: Feet supported;Bilateral upper extremity supported;Single extremity supported Sitting balance-Leahy Scale: Fair Sitting balance - Comments: Pt continues to demonstrate poor sitting balance with back unsupported. Requires cueing and at least 1 UE support t/o session to maintain upright neutral posture.   Standing balance support: Bilateral upper extremity supported;During functional activity Standing balance-Leahy Scale: Poor Standing balance comment: Mod A for stability                           ADL either performed or assessed with clinical judgement   ADL Overall ADL's : Needs assistance/impaired Eating/Feeding: Sitting;Minimal assistance Eating/Feeding Details (indicate cue type and reason): Pt on modified diet per SLP. Requires nectar thick liquids, but repeatedly requests "regular water" during session. This author declines. Pt continues to have decreased safety awareness and awareness of deficits. Would benefit from supervision for safety during self-feeding.                                   General ADL Comments: Pt continues to be functionally limited by ataxic movements, poor balance, and limited safety awareness/awareness of deficits. Continues to require +2 mod A for functional mobility or STS tasks such as LB dressing. Min A to supervision for  UB tasks that can be completed in sitting such as dressing and grooming.     Vision Baseline Vision/History: Wears glasses     Perception     Praxis Praxis Praxis-Other  Comments: Pt and provider engaged in therapeutic activities which targeted bimanual hand use, coordination, and motor planning. Pt continues to demonstrate functional deficits with motor planning and object manipulation.    Cognition Arousal/Alertness: Awake/alert Behavior During Therapy: Impulsive Overall Cognitive Status: Impaired/Different from baseline Area of Impairment: Safety/judgement;Problem solving                         Safety/Judgement: Decreased awareness of safety;Decreased awareness of deficits   Problem Solving: Slow processing;Difficulty sequencing;Requires verbal cues General Comments: Poor insight into functional deficits.        Exercises Total Joint Exercises Ankle Circles/Pumps: AROM;Strengthening;Both;10 reps Quad Sets: Strengthening;Both;10 reps Heel Slides: Strengthening;Both;10 reps;15 reps Hip ABduction/ADduction: Strengthening;Both;10 reps;15 reps Straight Leg Raises: Strengthening;10 reps;Both;15 reps Long Arc Quad: Strengthening;Both;10 reps;15 reps Knee Flexion: Strengthening;Both;10 reps;15 reps Marching in Standing: AROM;Both;10 reps;Standing Other Exercises Other Exercises: OT engages pt in fine motor coordination, bimanual hand use, and hand-eye coordination therapeutic activities including folding/unfolding paper, tearing paper, creating paper balls (1-handed alternating between R/L), and functional use of personal cell phone.   Shoulder Instructions       General Comments      Pertinent Vitals/ Pain       Pain Assessment: No/denies pain  Home Living Family/patient expects to be discharged to:: Private residence                                        Prior Functioning/Environment              Frequency  Min 3X/week        Progress Toward Goals  OT Goals(current goals can now be found in the care plan section)  Progress towards OT goals: Progressing toward goals  Acute Rehab OT Goals Patient  Stated Goal: to get better and go home OT Goal Formulation: With patient Time For Goal Achievement: 03/30/19 Potential to Achieve Goals: Good  Plan Discharge plan needs to be updated;Frequency needs to be updated    Co-evaluation                 AM-PAC OT "6 Clicks" Daily Activity     Outcome Measure   Help from another person eating meals?: A Little Help from another person taking care of personal grooming?: A Little Help from another person toileting, which includes using toliet, bedpan, or urinal?: A Lot Help from another person bathing (including washing, rinsing, drying)?: A Lot Help from another person to put on and taking off regular upper body clothing?: A Little Help from another person to put on and taking off regular lower body clothing?: A Lot 6 Click Score: 15    End of Session    OT Visit Diagnosis: History of falling (Z91.81);Other abnormalities of gait and mobility (R26.89);Other symptoms and signs involving the nervous system (R29.898)   Activity Tolerance Patient tolerated treatment well   Patient Left with call bell/phone within reach;in chair;with chair alarm set   Nurse Communication          Time: 6237-6283 OT Time Calculation (min): 36 min  Charges: OT General Charges $OT Visit: 1 Visit OT Treatments $Therapeutic Activity: 23-37 mins  Shara Blazing, M.S., OTR/L  Ascom: [phone] 03/19/19, 1:35 PM

## 2019-03-19 NOTE — Care Management Important Message (Signed)
Important Message  Patient Details  Name: Kyle [name] MRN: [id number] Date of Birth: 01/02/1951   Medicare Important Message Given:  Yes     Juliann Pulse A Charly Hunton 03/19/2019, 10:43 AM

## 2019-03-19 NOTE — TOC Progression Note (Signed)
Transition of Care [facility]) - Progression Note    Patient Details  Name: Kamel Haven MRN: [id number] Date of Birth: October 04, 1950  Transition of Care [facility] [place] CM/SW [place], RN Phone Number: 03/19/2019, 11:38 AM  Clinical Narrative:      Tiajuana Amass to discharge:  No bed availability at CIR.   Expected Discharge Plan: Home/Self Care Barriers to Discharge: Continued Medical Work up  Expected Discharge Plan and Services Expected Discharge Plan: Home/Self Care       Living arrangements for the past 2 months: Single Family Home                                       Social Determinants of Health (SDOH) Interventions    Readmission Risk Interventions No flowsheet data found.

## 2019-03-19 NOTE — Progress Notes (Signed)
Patient ID: Kyle [name], male   DOB: 1950/03/12, 69 y.o.   MRN: [id number] [facility] PROGRESS NOTE  Courtney Fenlon [id number] DOB: [birth year] DOA: 03/12/2019 PCP: Idelle Crouch, MD  HPI/Subjective: Patient had an episode of diarrhea and was waiting to be cleaned up.  I called the nursing staff to inform them.  Otherwise patient feels okay.  He feels that his strength is okay.  Still cannot open his left eye.  Objective: Vitals:   03/19/19 0450 03/19/19 0750  BP: (!) 142/90 (!) 145/64  Pulse: 69 63  Resp: 20 16  Temp: (!) 97.5 F (36.4 C) 98.1 F (36.7 C)  SpO2: 99% 98%    Intake/Output Summary (Last 24 hours) at 03/19/2019 1234 Last data filed at 03/19/2019 0900 Gross per 24 hour  Intake 240 ml  Output --  Net 240 ml   Filed Weights   03/12/19 1924 03/15/19 0530 03/18/19 0516  Weight: 93.9 kg (!) 201.6 kg (!) 136.5 kg    ROS: Review of Systems  Constitutional: Negative for chills and fever.  Eyes: Negative for blurred vision.  Respiratory: Negative for cough and shortness of breath.   Cardiovascular: Negative for chest pain.  Gastrointestinal: Positive for diarrhea. Negative for abdominal pain, constipation, nausea and vomiting.  Genitourinary: Negative for dysuria.  Musculoskeletal: Negative for joint pain.  Neurological: Negative for dizziness and headaches.   Exam: Physical Exam  HENT:  Nose: No mucosal edema.  Mouth/Throat: No oropharyngeal exudate or posterior oropharyngeal edema.  Eyes: Pupils are equal, round, and reactive to light. Conjunctivae are normal.  Ptosis left eyelid.  Unable to open it on his own.  Neck: Carotid bruit is not present.  Cardiovascular: S1 normal and S2 normal. Exam reveals no gallop.  No murmur heard. Respiratory: No respiratory distress. He has decreased breath sounds in the right lower field and the left lower field. He has no wheezes. He has no rhonchi. He has no rales.  GI: Soft. Bowel sounds are normal.  There is no abdominal tenderness.  Musculoskeletal:     Right ankle: No swelling.     Left ankle: No swelling.  Lymphadenopathy:    He has no cervical adenopathy.  Neurological: He is alert. No cranial nerve deficit.  Skin: Skin is warm. No rash noted. Nails show no clubbing.  Psychiatric: He has a normal mood and affect.      Data Reviewed: Basic Metabolic Panel: Recent Labs  Lab 03/12/19 1947 03/13/19 0436 03/14/19 0623 03/16/19 1443  NA 135 135 137 141  K 3.8 3.9 3.6 3.5  CL 103 106 102 102  CO2 14* 17* 19* 24  GLUCOSE 136* 107* 233* 73  BUN 67* 66* 59* 50*  CREATININE 10.31* 10.37* 9.39* 9.22*  CALCIUM 5.6* 5.6* 6.5* 8.1*  MG 1.4*  --  1.8  --   PHOS 9.1*  --  9.0* 9.5*   Liver Function Tests: Recent Labs  Lab 03/13/19 0436 03/14/19 0623 03/16/19 1443  AST 14* 14*  --   ALT 9 10  --   ALKPHOS 86 92  --   BILITOT 0.7 0.6  --   PROT 5.1* 5.2*  --   ALBUMIN 2.1* 2.1* 2.4*   CBC: Recent Labs  Lab 03/12/19 1947 03/13/19 0436 03/14/19 0623  WBC 8.1 7.2 5.6  NEUTROABS 6.0  --   --   HGB 9.0* 8.1* 8.3*  HCT 26.5* 24.0* 24.9*  MCV 95.7 96.0 96.5  PLT 209 183 178  CBG: Recent Labs  Lab 03/18/19 1315 03/18/19 1625 03/18/19 2045 03/19/19 0752 03/19/19 1134  GLUCAP 233* 122* 109* 140* 262*    Recent Results (from the past 240 hour(s))  SARS CORONAVIRUS 2 (TAT 6-24 HRS) Nasopharyngeal Nasopharyngeal Swab     Status: None   Collection Time: 03/12/19  9:31 PM   Specimen: Nasopharyngeal Swab  Result Value Ref Range Status   SARS Coronavirus 2 NEGATIVE NEGATIVE Final    Comment: (NOTE) SARS-CoV-2 target nucleic acids are NOT DETECTED. The SARS-CoV-2 RNA is generally detectable in upper and lower respiratory specimens during the acute phase of infection. Negative results do not preclude SARS-CoV-2 infection, do not rule out co-infections with other pathogens, and should not be used as the sole basis for treatment or other patient management  decisions. Negative results must be combined with clinical observations, patient history, and epidemiological information. The expected result is Negative. Fact Sheet for Patients: SugarRoll.be Fact Sheet for Healthcare Providers: https://www.woods-mathews.com/ This test is not yet approved or cleared by the [place] FDA and  has been authorized for detection and/or diagnosis of SARS-CoV-2 by FDA under an Emergency Use Authorization (EUA). This EUA will remain  in effect (meaning this test can be used) for the duration of the COVID-19 declaration under Section 56 4(b)(1) of the Act, 21 U.S.C. section 360bbb-3(b)(1), unless the authorization is terminated or revoked sooner. Performed at [facility], [place] [address], [place], [place] [address]      Studies: CT HEAD WO CONTRAST  Result Date: 03/17/2019 CLINICAL DATA:  69 year old male with stroke follow-up. EXAM: CT HEAD WITHOUT CONTRAST TECHNIQUE: Contiguous axial images were obtained from the base of the skull through the vertex without intravenous contrast. COMPARISON:  Head CT dated 03/13/2019 and MRI dated 03/15/2019 FINDINGS: Brain: There is mild age-related atrophy and chronic microvascular ischemic changes. Expected evolution of the left paramedian midbrain infarct (series 2, image 12). No hemorrhagic conversion. 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 There is no acute intracranial hemorrhage. No mass effect or midline shift. No extra-axial fluid collection. Vascular: No hyperdense vessel or unexpected calcification. Skull: Normal. Negative for fracture or focal lesion. Sinuses/Orbits: Chronic opacification the left maxillary sinus with inspissated mucous. The remainder of the visualized paranasal sinuses and mastoid air cells are clear. No air-fluid level. Other: None IMPRESSION: 1. No acute intracranial hemorrhage. Interval expected evolution of the previously seen mid brain  infarct. No hemorrhagic conversion. 2. Age-related atrophy and chronic microvascular ischemic changes. Electronically Signed   By: Anner Crete M.D.   On: 03/17/2019 19:01   CT CHEST WO CONTRAST  Result Date: 03/18/2019 CLINICAL DATA:  Cough. EXAM: CT CHEST WITHOUT CONTRAST TECHNIQUE: Multidetector CT imaging of the chest was performed following the standard protocol without IV contrast. COMPARISON:  November 05, 2016. FINDINGS: Cardiovascular: Atherosclerosis of thoracic aorta is noted without aneurysm formation. Normal cardiac size. No pericardial effusion. Coronary artery calcifications are noted. Mediastinum/Nodes: No enlarged mediastinal or axillary lymph nodes. Status post thyroidectomy. The trachea and esophagus demonstrate no significant findings. Lungs/Pleura: Lungs are clear. No pleural effusion or pneumothorax. Upper Abdomen: Cholelithiasis is noted. Musculoskeletal: No chest wall mass or suspicious bone lesions identified. IMPRESSION: Status post thyroidectomy. Cholelithiasis. Coronary artery calcifications are noted. No acute abnormality seen in the chest. Aortic Atherosclerosis (ICD10-I70.0). Electronically Signed   By: Marijo Conception M.D.   On: 03/18/2019 12:19    Scheduled Meds: . amLODipine  2.5 mg Oral Daily  . aspirin EC  81 mg  Oral Daily  . atorvastatin  80 mg Oral Daily  . calcitRIOL  0.5 mcg Oral Daily  . calcium acetate  1,334 mg Oral TID WC  . calcium carbonate  1,250 mg Oral TID WC  . clopidogrel  75 mg Oral Daily  . epoetin (EPOGEN/PROCRIT) injection  10,000 Units Subcutaneous Weekly  . feeding supplement (NEPRO CARB STEADY)  237 mL Oral BID BM  . gabapentin  100 mg Oral BID  . gentamicin cream  1 application Topical Daily  . heparin  5,000 Units Subcutaneous Q8H  . insulin aspart  0-15 Units Subcutaneous TID WC  . levETIRAcetam  500 mg Oral BID  . levothyroxine  200 mcg Oral q morning - 10a  . lipase/protease/amylase  24,000 Units Oral TID  . losartan  100 mg  Oral Daily  . metoprolol succinate  50 mg Oral Daily  . multivitamin  1 tablet Oral QHS  . sodium bicarbonate  650 mg Oral BID   Continuous Infusions: . dialysis solution 1.5% low-MG/low-CA Stopped (03/18/19 1126)    Assessment/Plan:  1. Acute left midbrain stroke. Patient has difficulty with balance, ptosis left eyelid. Patient on aspirin and Plavix. Likely will need ophthalmology appointment as outpatient. Hopefully they will have a bed for him at acute rehab tomorrow. 2. End-stage renal disease on peritoneal dialysis as per nephrology 3. Hypocalcemia, hypophosphatemia and hypomagnesemia. 4. Type 2 diabetes with hyperlipidemia on statin. LDL at goal 48. On sliding scale. Last hemoglobin A1c 7.1. Diet controlled 5. Seizure disorder on Keppra 6. Hypothyroidism unspecified on levothyroxine 7. Essential hypertension on losartan and metoprolol and Norvasc 8. Diabetic neuropathy started on gabapentin.  Careful with end-stage renal disease.  Keep at low dose.  Code Status:     Code Status Orders  (From admission, onward)         Start     Ordered   03/12/19 2211  Full code  Continuous     03/12/19 2211        Code Status History    Date Active Date Inactive Code Status Order ID Comments User Context   09/16/2018 2349 09/18/2018 1913 Full Code [id number]  Mayer Camel, NP ED   Advance Care Planning Activity    Advance Directive Documentation     Most Recent Value  Type of Advance Directive  Healthcare Power of Attorney  Pre-existing out of facility DNR order (yellow form or pink MOST form)  --  "MOST" Form in Place?  --     Family Communication: Spoke with the patient's wife on the phone. Disposition Plan: Awaiting bed to open up at acute rehab.  Most likely tomorrow.  Consultants:  Neurology  Nephrology  Time spent: 27 minutes  [place]

## 2019-03-19 NOTE — Progress Notes (Signed)
Pre CCPD Assessment    03/19/19 1800  Neurological  Level of Consciousness Alert  Orientation Level Oriented to person;Oriented to place  Respiratory  Respiratory Pattern Regular;Unlabored  Chest Assessment Chest expansion symmetrical  Bilateral Breath Sounds Diminished  Cough Non-productive  Cardiac  Pulse Regular  Heart Sounds S1, S2  Vascular  R Radial Pulse +2  L Radial Pulse +2  Edema Generalized  Psychosocial  Psychosocial (WDL) WDL  Patient Behaviors Cooperative;Calm

## 2019-03-19 NOTE — Progress Notes (Signed)
[place], [place] 03/19/19  Subjective:   Hospital day # 6  Denies any acute shortness of breath Problem with PD draining overnight.   ate 100 % of breakfast today  Renal: 01/20 0701 - 01/21 0700 In: -  Out: 678  Lab Results  Component Value Date   CREATININE 9.22 (H) 03/16/2019   CREATININE 9.39 (H) 03/14/2019   CREATININE 10.37 (H) 03/13/2019     Objective:  Vital signs in last 24 hours:  Temp:  [97.5 F (36.4 C)-98.3 F (36.8 C)] 98.1 F (36.7 C) (01/21 0750) Pulse Rate:  [62-69] 63 (01/21 0750) Resp:  [16-20] 16 (01/21 0750) BP: (126-145)/(59-90) 145/64 (01/21 0750) SpO2:  [98 %-99 %] 98 % (01/21 0750)  Weight change:  Filed Weights   03/12/19 1924 03/15/19 0530 03/18/19 0516  Weight: 93.9 kg (!) 201.6 kg (!) 136.5 kg    Intake/Output:    Intake/Output Summary (Last 24 hours) at 03/19/2019 5397 Last data filed at 03/19/2019 6734 Gross per 24 hour  Intake 240 ml  Output -  Net 240 ml     Physical Exam: General:  No acute distress, laying in the bed  HEENT  anicteric, moist oral mucous membranes, left ptosis  Pulm/lungs  normal breathing effort, clear to auscultation  CVS/Heart  no rub or gallop  Abdomen:   Soft, nontender, PD catheter in place  Extremities:  No edema  Neurologic:  Alert,  Able to answer Qs appropriately  Skin:  No acute rashes  Access:  PD catheter       Basic Metabolic Panel:  Recent Labs  Lab 03/12/19 1947 03/12/19 1947 03/13/19 0436 03/14/19 0623 03/16/19 1443  NA 135  --  135 137 141  K 3.8  --  3.9 3.6 3.5  CL 103  --  106 102 102  CO2 14*  --  17* 19* 24  GLUCOSE 136*  --  107* 233* 73  BUN 67*  --  66* 59* 50*  CREATININE 10.31*  --  10.37* 9.39* 9.22*  CALCIUM 5.6*   < > 5.6* 6.5* 8.1*  MG 1.4*  --   --  1.8  --   PHOS 9.1*  --   --  9.0* 9.5*   < > = values in this interval not displayed.     CBC: Recent Labs  Lab 03/12/19 1947 03/13/19 0436 03/14/19 0623  WBC 8.1  7.2 5.6  NEUTROABS 6.0  --   --   HGB 9.0* 8.1* 8.3*  HCT 26.5* 24.0* 24.9*  MCV 95.7 96.0 96.5  PLT 209 183 178     No results found for: HEPBSAG, HEPBSAB, HEPBIGM    Microbiology:  Recent Results (from the past 240 hour(s))  SARS CORONAVIRUS 2 (TAT 6-24 HRS) Nasopharyngeal Nasopharyngeal Swab     Status: None   Collection Time: 03/12/19  9:31 PM   Specimen: Nasopharyngeal Swab  Result Value Ref Range Status   SARS Coronavirus 2 NEGATIVE NEGATIVE Final    Comment: (NOTE) SARS-CoV-2 target nucleic acids are NOT DETECTED. The SARS-CoV-2 RNA is generally detectable in upper and lower respiratory specimens during the acute phase of infection. Negative results do not preclude SARS-CoV-2 infection, do not rule out co-infections with other pathogens, and should not be used as the sole basis for treatment or other patient management decisions. Negative results must be combined with clinical observations, patient history, and epidemiological information. The expected result is Negative. Fact Sheet for Patients: SugarRoll.be Fact Sheet for Healthcare  Providers: https://www.woods-mathews.com/ This test is not yet approved or cleared by the [place] and  has been authorized for detection and/or diagnosis of SARS-CoV-2 by FDA under an Emergency Use Authorization (EUA). This EUA will remain  in effect (meaning this test can be used) for the duration of the COVID-19 declaration under Section 56 4(b)(1) of the Act, 21 U.S.C. section 360bbb-3(b)(1), unless the authorization is terminated or revoked sooner. Performed at [facility], [place] [address] [place], [place] [address]     Coagulation Studies: No results for input(s): LABPROT, INR in the last 72 hours.  Urinalysis: No results for input(s): COLORURINE, LABSPEC, PHURINE, GLUCOSEU, HGBUR, BILIRUBINUR, KETONESUR, PROTEINUR, UROBILINOGEN, NITRITE, LEUKOCYTESUR in the last 72  hours.  Invalid input(s): APPERANCEUR    Imaging: CT HEAD WO CONTRAST  Result Date: 03/17/2019 CLINICAL DATA:  69 year old male with stroke follow-up. EXAM: CT HEAD WITHOUT CONTRAST TECHNIQUE: Contiguous axial images were obtained from the base of the skull through the vertex without intravenous contrast. COMPARISON:  Head CT dated 03/13/2019 and MRI dated 03/15/2019 FINDINGS: Brain: There is mild age-related atrophy and chronic microvascular ischemic changes. Expected evolution of the left paramedian midbrain infarct (series 2, image 12). No hemorrhagic conversion. 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 There is no acute intracranial hemorrhage. No mass effect or midline shift. No extra-axial fluid collection. Vascular: No hyperdense vessel or unexpected calcification. Skull: Normal. Negative for fracture or focal lesion. Sinuses/Orbits: Chronic opacification the left maxillary sinus with inspissated mucous. The remainder of the visualized paranasal sinuses and mastoid air cells are clear. No air-fluid level. Other: None IMPRESSION: 1. No acute intracranial hemorrhage. Interval expected evolution of the previously seen mid brain infarct. No hemorrhagic conversion. 2. Age-related atrophy and chronic microvascular ischemic changes. Electronically Signed   By: Anner Crete M.D.   On: 03/17/2019 19:01   CT CHEST WO CONTRAST  Result Date: 03/18/2019 CLINICAL DATA:  Cough. EXAM: CT CHEST WITHOUT CONTRAST TECHNIQUE: Multidetector CT imaging of the chest was performed following the standard protocol without IV contrast. COMPARISON:  November 05, 2016. FINDINGS: Cardiovascular: Atherosclerosis of thoracic aorta is noted without aneurysm formation. Normal cardiac size. No pericardial effusion. Coronary artery calcifications are noted. Mediastinum/Nodes: No enlarged mediastinal or axillary lymph nodes. Status post thyroidectomy. The trachea and esophagus demonstrate no significant findings.  Lungs/Pleura: Lungs are clear. No pleural effusion or pneumothorax. Upper Abdomen: Cholelithiasis is noted. Musculoskeletal: No chest wall mass or suspicious bone lesions identified. IMPRESSION: Status post thyroidectomy. Cholelithiasis. Coronary artery calcifications are noted. No acute abnormality seen in the chest. Aortic Atherosclerosis (ICD10-I70.0). Electronically Signed   By: Marijo Conception M.D.   On: 03/18/2019 12:19     Medications:   . dialysis solution 1.5% low-MG/low-CA Stopped (03/18/19 1126)   . amLODipine  2.5 mg Oral Daily  . aspirin EC  81 mg Oral Daily  . atorvastatin  80 mg Oral Daily  . calcitRIOL  0.5 mcg Oral Daily  . calcium acetate  1,334 mg Oral TID WC  . calcium carbonate  1,250 mg Oral TID WC  . clopidogrel  75 mg Oral Daily  . epoetin (EPOGEN/PROCRIT) injection  10,000 Units Subcutaneous Weekly  . feeding supplement (NEPRO CARB STEADY)  237 mL Oral BID BM  . gabapentin  100 mg Oral BID  . gentamicin cream  1 application Topical Daily  . heparin  5,000 Units Subcutaneous Q8H  . insulin aspart  0-15 Units Subcutaneous TID WC  . levETIRAcetam  500 mg  Oral BID  . levothyroxine  200 mcg Oral q morning - 10a  . lipase/protease/amylase  24,000 Units Oral TID  . losartan  100 mg Oral Daily  . metoprolol succinate  50 mg Oral Daily  . multivitamin  1 tablet Oral QHS  . sodium bicarbonate  650 mg Oral BID     Assessment/ Plan:  69 y.o. male with end stage renal disease on peritoneal dialysis, hypertension, diabetes mellitus type II, seizure disorder, nephrolithiasis, hypothyroidism, hyperlipidemia, depression, BPH, COPD  admitted on 03/12/2019 for Hypocalcemia [E83.51] Hypomagnesemia [E83.42] Prolonged Q-T interval on ECG [R94.31] Generalized weakness [R53.1]  [facility] 94 kg CCPD 9 hours 5 exchanges 2579mL fills  Dry during daytime  #End-stage renal disease At present, continue CCPD based on home regimen Discharge planning:  If patient is sent to SNF/rehab, he will need to convert to hemodialysis with PermCath placement Physical therapist reported that patient is too weak and may need rehab placement. Inpatient rehab is being considered   #Secondary hyperparathyroidism with severe hypocalcemia Lab Results  Component Value Date   PTH 91 (H) 03/13/2019   CALCIUM 8.1 (L) 03/16/2019   PHOS 9.5 (H) 03/16/2019   High phosphorus and low calcium is noted Encourage compliance with binders-PhosLo Continue oral calcium with vitamin D, calcitriol  #Anemia of chronic kidney disease Lab Results  Component Value Date   HGB 8.3 (L) 03/14/2019  Continue Epogen subcu weekly (Saturday)  # Acute stroke Acute infarction of paramedian left mid brain Asa, atorvastatin, plavix Neurology eval ongoing    LOS: [place] 1/21/20219:22 AM  [facility], [place]  Note: This note was prepared with Dragon dictation. Any transcription errors are unintentional

## 2019-03-20 ENCOUNTER — Other Ambulatory Visit: Payer: Self-pay

## 2019-03-20 ENCOUNTER — Encounter: Payer: Self-pay | Admitting: Internal Medicine

## 2019-03-20 ENCOUNTER — Inpatient Hospital Stay (HOSPITAL_COMMUNITY)
Admission: RE | Admit: 2019-03-20 | Discharge: 2019-04-04 | DRG: 981 | Disposition: A | Discharge status: Other Acute Inpatient Hospital | Payer: Medicare PPO | Source: Other Acute Inpatient Hospital | Attending: Physical Medicine & Rehabilitation | Admitting: Physical Medicine & Rehabilitation

## 2019-03-20 ENCOUNTER — Encounter (HOSPITAL_COMMUNITY): Payer: Self-pay | Admitting: Physical Medicine & Rehabilitation

## 2019-03-20 DIAGNOSIS — E1151 Type 2 diabetes mellitus with diabetic peripheral angiopathy without gangrene: Secondary | ICD-10-CM | POA: Diagnosis present

## 2019-03-20 DIAGNOSIS — E785 Hyperlipidemia, unspecified: Secondary | ICD-10-CM | POA: Diagnosis present

## 2019-03-20 DIAGNOSIS — K921 Melena: Secondary | ICD-10-CM | POA: Diagnosis not present

## 2019-03-20 DIAGNOSIS — Z79899 Other long term (current) drug therapy: Secondary | ICD-10-CM

## 2019-03-20 DIAGNOSIS — I6350 Cerebral infarction due to unspecified occlusion or stenosis of unspecified cerebral artery: Secondary | ICD-10-CM | POA: Diagnosis present

## 2019-03-20 DIAGNOSIS — Z992 Dependence on renal dialysis: Secondary | ICD-10-CM | POA: Diagnosis not present

## 2019-03-20 DIAGNOSIS — I69351 Hemiplegia and hemiparesis following cerebral infarction affecting right dominant side: Secondary | ICD-10-CM | POA: Diagnosis present

## 2019-03-20 DIAGNOSIS — E876 Hypokalemia: Secondary | ICD-10-CM | POA: Diagnosis present

## 2019-03-20 DIAGNOSIS — R531 Weakness: Secondary | ICD-10-CM

## 2019-03-20 DIAGNOSIS — K529 Noninfective gastroenteritis and colitis, unspecified: Secondary | ICD-10-CM | POA: Diagnosis present

## 2019-03-20 DIAGNOSIS — G40909 Epilepsy, unspecified, not intractable, without status epilepticus: Secondary | ICD-10-CM | POA: Diagnosis present

## 2019-03-20 DIAGNOSIS — N186 End stage renal disease: Secondary | ICD-10-CM | POA: Diagnosis present

## 2019-03-20 DIAGNOSIS — E119 Type 2 diabetes mellitus without complications: Secondary | ICD-10-CM

## 2019-03-20 DIAGNOSIS — D638 Anemia in other chronic diseases classified elsewhere: Secondary | ICD-10-CM | POA: Diagnosis present

## 2019-03-20 DIAGNOSIS — E1122 Type 2 diabetes mellitus with diabetic chronic kidney disease: Secondary | ICD-10-CM | POA: Diagnosis present

## 2019-03-20 DIAGNOSIS — R296 Repeated falls: Secondary | ICD-10-CM | POA: Diagnosis present

## 2019-03-20 DIAGNOSIS — J449 Chronic obstructive pulmonary disease, unspecified: Secondary | ICD-10-CM | POA: Diagnosis present

## 2019-03-20 DIAGNOSIS — E1140 Type 2 diabetes mellitus with diabetic neuropathy, unspecified: Secondary | ICD-10-CM | POA: Diagnosis present

## 2019-03-20 DIAGNOSIS — R5383 Other fatigue: Secondary | ICD-10-CM | POA: Diagnosis not present

## 2019-03-20 DIAGNOSIS — N400 Enlarged prostate without lower urinary tract symptoms: Secondary | ICD-10-CM | POA: Diagnosis present

## 2019-03-20 DIAGNOSIS — I69391 Dysphagia following cerebral infarction: Secondary | ICD-10-CM | POA: Diagnosis not present

## 2019-03-20 DIAGNOSIS — H4902 Third [oculomotor] nerve palsy, left eye: Secondary | ICD-10-CM | POA: Diagnosis present

## 2019-03-20 DIAGNOSIS — I639 Cerebral infarction, unspecified: Secondary | ICD-10-CM | POA: Diagnosis present

## 2019-03-20 DIAGNOSIS — R1310 Dysphagia, unspecified: Secondary | ICD-10-CM | POA: Diagnosis present

## 2019-03-20 DIAGNOSIS — I10 Essential (primary) hypertension: Secondary | ICD-10-CM | POA: Diagnosis present

## 2019-03-20 DIAGNOSIS — I739 Peripheral vascular disease, unspecified: Secondary | ICD-10-CM | POA: Diagnosis present

## 2019-03-20 DIAGNOSIS — K861 Other chronic pancreatitis: Secondary | ICD-10-CM | POA: Diagnosis present

## 2019-03-20 DIAGNOSIS — N2581 Secondary hyperparathyroidism of renal origin: Secondary | ICD-10-CM | POA: Diagnosis present

## 2019-03-20 DIAGNOSIS — Z419 Encounter for procedure for purposes other than remedying health state, unspecified: Secondary | ICD-10-CM

## 2019-03-20 DIAGNOSIS — Z79890 Hormone replacement therapy (postmenopausal): Secondary | ICD-10-CM

## 2019-03-20 DIAGNOSIS — W06XXXA Fall from bed, initial encounter: Secondary | ICD-10-CM | POA: Diagnosis not present

## 2019-03-20 DIAGNOSIS — Z01810 Encounter for preprocedural cardiovascular examination: Secondary | ICD-10-CM | POA: Diagnosis not present

## 2019-03-20 DIAGNOSIS — E890 Postprocedural hypothyroidism: Secondary | ICD-10-CM | POA: Diagnosis present

## 2019-03-20 DIAGNOSIS — F17210 Nicotine dependence, cigarettes, uncomplicated: Secondary | ICD-10-CM | POA: Diagnosis present

## 2019-03-20 DIAGNOSIS — E1121 Type 2 diabetes mellitus with diabetic nephropathy: Secondary | ICD-10-CM | POA: Diagnosis present

## 2019-03-20 DIAGNOSIS — D631 Anemia in chronic kidney disease: Secondary | ICD-10-CM | POA: Diagnosis present

## 2019-03-20 DIAGNOSIS — Z9889 Other specified postprocedural states: Secondary | ICD-10-CM

## 2019-03-20 DIAGNOSIS — I120 Hypertensive chronic kidney disease with stage 5 chronic kidney disease or end stage renal disease: Secondary | ICD-10-CM | POA: Diagnosis present

## 2019-03-20 DIAGNOSIS — Y92230 Patient room in hospital as the place of occurrence of the external cause: Secondary | ICD-10-CM | POA: Diagnosis not present

## 2019-03-20 DIAGNOSIS — N185 Chronic kidney disease, stage 5: Secondary | ICD-10-CM | POA: Diagnosis present

## 2019-03-20 DIAGNOSIS — K922 Gastrointestinal hemorrhage, unspecified: Secondary | ICD-10-CM | POA: Diagnosis present

## 2019-03-20 DIAGNOSIS — Z20822 Contact with and (suspected) exposure to covid-19: Secondary | ICD-10-CM | POA: Diagnosis present

## 2019-03-20 DIAGNOSIS — E1165 Type 2 diabetes mellitus with hyperglycemia: Secondary | ICD-10-CM | POA: Diagnosis not present

## 2019-03-20 DIAGNOSIS — Z794 Long term (current) use of insulin: Secondary | ICD-10-CM

## 2019-03-20 DIAGNOSIS — Z7982 Long term (current) use of aspirin: Secondary | ICD-10-CM

## 2019-03-20 DIAGNOSIS — E1142 Type 2 diabetes mellitus with diabetic polyneuropathy: Secondary | ICD-10-CM | POA: Diagnosis not present

## 2019-03-20 DIAGNOSIS — Z833 Family history of diabetes mellitus: Secondary | ICD-10-CM

## 2019-03-20 LAB — MAGNESIUM: Magnesium: 1.5 mg/dL — ABNORMAL LOW (ref 1.7–2.4)

## 2019-03-20 LAB — BASIC METABOLIC PANEL
Anion gap: 15 (ref 5–15)
BUN: 44 mg/dL — ABNORMAL HIGH (ref 8–23)
CO2: 26 mmol/L (ref 22–32)
Calcium: 8.6 mg/dL — ABNORMAL LOW (ref 8.9–10.3)
Chloride: 100 mmol/L (ref 98–111)
Creatinine, Ser: 8.63 mg/dL — ABNORMAL HIGH (ref 0.61–1.24)
GFR calc Af Amer: 6 mL/min — ABNORMAL LOW (ref >60)
GFR calc non Af Amer: 6 mL/min — ABNORMAL LOW (ref >60)
Glucose, Bld: 207 mg/dL — ABNORMAL HIGH (ref 70–99)
Potassium: 3 mmol/L — ABNORMAL LOW (ref 3.5–5.1)
Sodium: 141 mmol/L (ref 135–145)

## 2019-03-20 LAB — CBC
HCT: 24 % — ABNORMAL LOW (ref 39.0–52.0)
Hemoglobin: 8.1 g/dL — ABNORMAL LOW (ref 13.0–17.0)
MCH: 33.3 pg (ref 26.0–34.0)
MCHC: 33.8 g/dL (ref 30.0–36.0)
MCV: 98.8 fL (ref 80.0–100.0)
Platelets: 195 10*3/uL (ref 150–400)
RBC: 2.43 MIL/uL — ABNORMAL LOW (ref 4.22–5.81)
RDW: 15.8 % — ABNORMAL HIGH (ref 11.5–15.5)
WBC: 6.4 10*3/uL (ref 4.0–10.5)
nRBC: 0 % (ref 0.0–0.2)

## 2019-03-20 LAB — GLUCOSE, CAPILLARY
Glucose-Capillary: 189 mg/dL — ABNORMAL HIGH (ref 70–99)
Glucose-Capillary: 161 mg/dL — ABNORMAL HIGH (ref 70–99)
Glucose-Capillary: 223 mg/dL — ABNORMAL HIGH (ref 70–99)
Glucose-Capillary: 314 mg/dL — ABNORMAL HIGH (ref 70–99)

## 2019-03-20 MED ORDER — FLEET ENEMA 7-19 GM/118ML RE ENEM: 1.0000 | ENEMA | Freq: Once | RECTAL | Status: DC | PRN

## 2019-03-20 MED ORDER — NEPRO/CARBSTEADY PO LIQD: 237.0000 mL | Freq: Two times a day (BID) | ORAL | 0 refills | Status: DC

## 2019-03-20 MED ORDER — PROCHLORPERAZINE 25 MG RE SUPP: 12.5000 mg | Freq: Four times a day (QID) | RECTAL | Status: DC | PRN

## 2019-03-20 MED ORDER — GABAPENTIN 100 MG PO CAPS
100.0000 mg | ORAL_CAPSULE | Freq: Two times a day (BID) | ORAL | Status: DC
  Administered 2019-03-20 – 2019-04-04 (×29): 100 mg via ORAL
  Filled 2019-03-20 (×28): qty 1

## 2019-03-20 MED ORDER — HEPARIN 1000 UNIT/ML FOR PERITONEAL DIALYSIS
INTRAPERITONEAL | Status: DC | PRN
  Filled 2019-03-20: qty 5000

## 2019-03-20 MED ORDER — SODIUM BICARBONATE 650 MG PO TABS: 650.0000 mg | ORAL_TABLET | Freq: Two times a day (BID) | ORAL | 0 refills | Status: DC

## 2019-03-20 MED ORDER — NEPRO/CARBSTEADY PO LIQD
237.0000 mL | Freq: Two times a day (BID) | ORAL | Status: DC
  Administered 2019-03-21 – 2019-04-03 (×16): 237 mL via ORAL

## 2019-03-20 MED ORDER — CLOPIDOGREL BISULFATE 75 MG PO TABS: 75.0000 mg | ORAL_TABLET | Freq: Every day | ORAL | 0 refills | Status: DC

## 2019-03-20 MED ORDER — CALCIUM ACETATE (PHOS BINDER) 667 MG PO CAPS
1334.0000 mg | ORAL_CAPSULE | Freq: Three times a day (TID) | ORAL | Status: DC
  Administered 2019-03-20 – 2019-04-03 (×37): 1334 mg via ORAL
  Filled 2019-03-20 (×38): qty 2

## 2019-03-20 MED ORDER — GENTAMICIN SULFATE 0.1 % EX CREA: 1.0000 "application " | TOPICAL_CREAM | Freq: Every day | CUTANEOUS | 0 refills | Status: DC

## 2019-03-20 MED ORDER — PROCHLORPERAZINE EDISYLATE 10 MG/2ML IJ SOLN
5.0000 mg | Freq: Four times a day (QID) | INTRAMUSCULAR | Status: DC | PRN
  Administered 2019-04-04: 02:00:00 10 mg via INTRAMUSCULAR
  Filled 2019-03-20: qty 2

## 2019-03-20 MED ORDER — DELFLEX-LC/1.5% DEXTROSE 344 MOSM/L IP SOLN
INTRAPERITONEAL | Status: DC
  Administered 2019-03-25 – 2019-03-28 (×2): 5000 mL via INTRAPERITONEAL

## 2019-03-20 MED ORDER — CALCITRIOL 0.5 MCG PO CAPS
0.5000 ug | ORAL_CAPSULE | Freq: Every day | ORAL | Status: DC
  Administered 2019-03-21 – 2019-03-26 (×6): 0.5 ug via ORAL
  Filled 2019-03-20 (×6): qty 1

## 2019-03-20 MED ORDER — INSULIN GLARGINE 100 UNITS/ML SOLOSTAR PEN: 5.0000 [IU] | PEN_INJECTOR | Freq: Every day | SUBCUTANEOUS | 11 refills | Status: DC

## 2019-03-20 MED ORDER — AMLODIPINE BESYLATE 2.5 MG PO TABS: 2.5000 mg | ORAL_TABLET | Freq: Every day | ORAL | Status: DC

## 2019-03-20 MED ORDER — MAGNESIUM SULFATE 2 GM/50ML IV SOLN
2.0000 g | INTRAVENOUS | Status: AC
  Administered 2019-03-20: 2 g via INTRAVENOUS
  Filled 2019-03-20: qty 50

## 2019-03-20 MED ORDER — POLYETHYLENE GLYCOL 3350 17 G PO PACK: 17.0000 g | PACK | Freq: Every day | ORAL | Status: DC | PRN

## 2019-03-20 MED ORDER — AMLODIPINE BESYLATE 2.5 MG PO TABS
2.5000 mg | ORAL_TABLET | Freq: Every day | ORAL | Status: DC
  Administered 2019-03-21 – 2019-03-26 (×6): 2.5 mg via ORAL
  Filled 2019-03-20 (×6): qty 1

## 2019-03-20 MED ORDER — INSULIN ASPART 100 UNIT/ML ~~LOC~~ SOLN
0.0000 [IU] | Freq: Three times a day (TID) | SUBCUTANEOUS | Status: DC
  Administered 2019-03-21: 8 [IU] via SUBCUTANEOUS
  Administered 2019-03-21: 3 [IU] via SUBCUTANEOUS
  Administered 2019-03-21: 5 [IU] via SUBCUTANEOUS
  Administered 2019-03-22: 3 [IU] via SUBCUTANEOUS
  Administered 2019-03-22: 5 [IU] via SUBCUTANEOUS
  Administered 2019-03-22 – 2019-03-23 (×2): 8 [IU] via SUBCUTANEOUS
  Administered 2019-03-23: 5 [IU] via SUBCUTANEOUS
  Administered 2019-03-23: 3 [IU] via SUBCUTANEOUS
  Administered 2019-03-24 (×2): 5 [IU] via SUBCUTANEOUS
  Administered 2019-03-24: 3 [IU] via SUBCUTANEOUS
  Administered 2019-03-25: 5 [IU] via SUBCUTANEOUS
  Administered 2019-03-26: 3 [IU] via SUBCUTANEOUS
  Administered 2019-03-26: 5 [IU] via SUBCUTANEOUS
  Administered 2019-03-26: 3 [IU] via SUBCUTANEOUS
  Administered 2019-03-27: 5 [IU] via SUBCUTANEOUS
  Administered 2019-03-27: 3 [IU] via SUBCUTANEOUS
  Administered 2019-03-27: 2 [IU] via SUBCUTANEOUS
  Administered 2019-03-28: 5 [IU] via SUBCUTANEOUS
  Administered 2019-03-28: 8 [IU] via SUBCUTANEOUS
  Administered 2019-03-29: 2 [IU] via SUBCUTANEOUS
  Administered 2019-03-29: 3 [IU] via SUBCUTANEOUS
  Administered 2019-03-29: 2 [IU] via SUBCUTANEOUS
  Administered 2019-03-30: 13:00:00 3 [IU] via SUBCUTANEOUS
  Administered 2019-03-30: 5 [IU] via SUBCUTANEOUS
  Administered 2019-03-31: 12:00:00 11 [IU] via SUBCUTANEOUS
  Administered 2019-03-31: 8 [IU] via SUBCUTANEOUS
  Administered 2019-03-31: 20:00:00 3 [IU] via SUBCUTANEOUS
  Administered 2019-04-01 (×2): 8 [IU] via SUBCUTANEOUS
  Administered 2019-04-01 – 2019-04-02 (×2): 2 [IU] via SUBCUTANEOUS
  Administered 2019-04-02: 18:00:00 3 [IU] via SUBCUTANEOUS
  Administered 2019-04-02: 11 [IU] via SUBCUTANEOUS
  Administered 2019-04-03 (×2): 2 [IU] via SUBCUTANEOUS
  Administered 2019-04-03: 13:00:00 5 [IU] via SUBCUTANEOUS
  Administered 2019-04-04: 09:00:00 2 [IU] via SUBCUTANEOUS

## 2019-03-20 MED ORDER — LOSARTAN POTASSIUM 50 MG PO TABS
100.0000 mg | ORAL_TABLET | Freq: Every day | ORAL | Status: DC
  Administered 2019-03-21 – 2019-03-23 (×3): 100 mg via ORAL
  Filled 2019-03-20 (×3): qty 2

## 2019-03-20 MED ORDER — GENTAMICIN SULFATE 0.1 % EX CREA
1.0000 "application " | TOPICAL_CREAM | Freq: Every day | CUTANEOUS | Status: DC
  Administered 2019-03-20 – 2019-03-28 (×8): 1 via TOPICAL
  Filled 2019-03-20: qty 15

## 2019-03-20 MED ORDER — SODIUM BICARBONATE 650 MG PO TABS
650.0000 mg | ORAL_TABLET | Freq: Two times a day (BID) | ORAL | Status: DC
  Administered 2019-03-20 – 2019-03-22 (×4): 650 mg via ORAL
  Filled 2019-03-20 (×4): qty 1

## 2019-03-20 MED ORDER — DELFLEX-LC/1.5% DEXTROSE 344 MOSM/L IP SOLN: INTRAPERITONEAL | Status: DC

## 2019-03-20 MED ORDER — POTASSIUM CHLORIDE CRYS ER 20 MEQ PO TBCR
20.0000 meq | EXTENDED_RELEASE_TABLET | Freq: Two times a day (BID) | ORAL | Status: AC
  Administered 2019-03-20 – 2019-03-21 (×2): 20 meq via ORAL
  Filled 2019-03-20 (×2): qty 1

## 2019-03-20 MED ORDER — CALCIUM CARBONATE 1250 (500 CA) MG PO TABS
1250.0000 mg | ORAL_TABLET | Freq: Three times a day (TID) | ORAL | Status: DC
  Administered 2019-03-20 – 2019-03-23 (×7): 1250 mg via ORAL
  Filled 2019-03-20 (×9): qty 1

## 2019-03-20 MED ORDER — BISACODYL 10 MG RE SUPP: 10.0000 mg | Freq: Every day | RECTAL | Status: DC | PRN

## 2019-03-20 MED ORDER — PANCRELIPASE (LIP-PROT-AMYL) 12000-38000 UNITS PO CPEP
24000.0000 [IU] | ORAL_CAPSULE | Freq: Three times a day (TID) | ORAL | Status: DC
  Administered 2019-03-20 – 2019-04-04 (×39): 24000 [IU] via ORAL
  Filled 2019-03-20 (×43): qty 2

## 2019-03-20 MED ORDER — GUAIFENESIN-DM 100-10 MG/5ML PO SYRP: 5.0000 mL | ORAL_SOLUTION | Freq: Four times a day (QID) | ORAL | Status: DC | PRN

## 2019-03-20 MED ORDER — ATORVASTATIN CALCIUM 80 MG PO TABS
80.0000 mg | ORAL_TABLET | Freq: Every day | ORAL | Status: DC
  Administered 2019-03-20 – 2019-04-03 (×15): 80 mg via ORAL
  Filled 2019-03-20 (×17): qty 1

## 2019-03-20 MED ORDER — CALCIUM ACETATE (PHOS BINDER) 667 MG PO CAPS: 1334.0000 mg | ORAL_CAPSULE | Freq: Three times a day (TID) | ORAL | 0 refills | Status: DC

## 2019-03-20 MED ORDER — HEPARIN 1000 UNIT/ML FOR PERITONEAL DIALYSIS: 500.0000 [IU] | INTRAMUSCULAR | Status: DC | PRN

## 2019-03-20 MED ORDER — ALUM & MAG HYDROXIDE-SIMETH 200-200-20 MG/5ML PO SUSP: 30.0000 mL | ORAL | Status: DC | PRN

## 2019-03-20 MED ORDER — PROCHLORPERAZINE MALEATE 5 MG PO TABS: 5.0000 mg | ORAL_TABLET | Freq: Four times a day (QID) | ORAL | Status: DC | PRN

## 2019-03-20 MED ORDER — ASPIRIN 81 MG PO TBEC: 81.0000 mg | DELAYED_RELEASE_TABLET | Freq: Every day | ORAL | Status: DC

## 2019-03-20 MED ORDER — ACETAMINOPHEN 325 MG PO TABS
325.0000 mg | ORAL_TABLET | ORAL | Status: DC | PRN
  Administered 2019-03-26 – 2019-04-01 (×3): 650 mg via ORAL
  Filled 2019-03-20 (×3): qty 2

## 2019-03-20 MED ORDER — ATORVASTATIN CALCIUM 80 MG PO TABS: 80.0000 mg | ORAL_TABLET | Freq: Every day | ORAL | 0 refills | Status: DC

## 2019-03-20 MED ORDER — LEVETIRACETAM 500 MG PO TABS
500.0000 mg | ORAL_TABLET | Freq: Two times a day (BID) | ORAL | Status: DC
  Administered 2019-03-20 – 2019-03-23 (×6): 500 mg via ORAL
  Filled 2019-03-20 (×7): qty 1

## 2019-03-20 MED ORDER — INSULIN LISPRO (1 UNIT DIAL) 100 UNIT/ML (KWIKPEN): PEN_INJECTOR | SUBCUTANEOUS | 11 refills | Status: DC

## 2019-03-20 MED ORDER — LEVOTHYROXINE SODIUM 100 MCG PO TABS
200.0000 ug | ORAL_TABLET | Freq: Every morning | ORAL | Status: DC
  Administered 2019-03-21 – 2019-04-04 (×14): 200 ug via ORAL
  Filled 2019-03-20 (×14): qty 2

## 2019-03-20 MED ORDER — CALCITRIOL 0.5 MCG PO CAPS: 0.5000 ug | ORAL_CAPSULE | Freq: Every day | ORAL | 0 refills | Status: DC

## 2019-03-20 MED ORDER — GENTAMICIN SULFATE 0.1 % EX CREA: 1.0000 "application " | TOPICAL_CREAM | Freq: Every day | CUTANEOUS | Status: DC

## 2019-03-20 MED ORDER — HEPARIN SODIUM (PORCINE) 5000 UNIT/ML IJ SOLN
5000.0000 [IU] | Freq: Three times a day (TID) | INTRAMUSCULAR | Status: DC
  Administered 2019-03-20 – 2019-03-30 (×30): 5000 [IU] via SUBCUTANEOUS
  Filled 2019-03-20 (×31): qty 1

## 2019-03-20 MED ORDER — CLOPIDOGREL BISULFATE 75 MG PO TABS
75.0000 mg | ORAL_TABLET | Freq: Every day | ORAL | Status: DC
  Administered 2019-03-21 – 2019-04-03 (×13): 75 mg via ORAL
  Filled 2019-03-20 (×13): qty 1

## 2019-03-20 MED ORDER — ASPIRIN EC 81 MG PO TBEC
81.0000 mg | DELAYED_RELEASE_TABLET | Freq: Every day | ORAL | Status: DC
  Administered 2019-03-21 – 2019-04-04 (×14): 81 mg via ORAL
  Filled 2019-03-20 (×14): qty 1

## 2019-03-20 MED ORDER — RENA-VITE PO TABS
1.0000 | ORAL_TABLET | Freq: Every day | ORAL | Status: DC
  Administered 2019-03-21 – 2019-03-24 (×4): 1 via ORAL
  Filled 2019-03-20 (×4): qty 1

## 2019-03-20 MED ORDER — RENA-VITE PO TABS: 1.0000 | ORAL_TABLET | Freq: Every day | ORAL | 0 refills | Status: DC

## 2019-03-20 MED ORDER — METOPROLOL SUCCINATE ER 50 MG PO TB24
50.0000 mg | ORAL_TABLET | Freq: Every day | ORAL | Status: DC
  Administered 2019-03-21 – 2019-04-04 (×14): 50 mg via ORAL
  Filled 2019-03-20 (×14): qty 1

## 2019-03-20 MED ORDER — EPOETIN ALFA 10000 UNIT/ML IJ SOLN
10000.0000 [IU] | INTRAMUSCULAR | Status: DC
  Administered 2019-03-22: 10000 [IU] via SUBCUTANEOUS
  Filled 2019-03-20: qty 1

## 2019-03-20 NOTE — TOC Transition Note (Signed)
Transition of Care [facility]) - CM/SW Discharge Note   Patient Details  Name: Kyle [name] MRN: [id number] Date of Birth: 01-19-1951  Transition of [facility]) CM/SW Contact:  Victorino Dike, RN Phone Number: 03/20/2019, 11:38 AM   Clinical Narrative:     Patient to transfer to [facility], Transportation via [place], scheduled for 115.  No further TOC needs at this time, please re-consult for new needs.   Final next level of care: Acute to Acute Transfer Barriers to Discharge: Barriers Resolved   Patient Goals and CMS Choice        Discharge Placement                Patient to be transferred to facility by: [facility] Name of family member notified: Mayfield Schoene Patient and family notified of of transfer: 03/20/19  Discharge Plan and Services                                     Social Determinants of Health (SDOH) Interventions     Readmission Risk Interventions No flowsheet data found.

## 2019-03-20 NOTE — H&P (Signed)
Physical Medicine and Rehabilitation Admission H&P    Chief Complaint  Patient presents with  . 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 Wife reported having missed dialysis for 3 days? And patient with elevated phosphorous and anion gap. He was started on IV calcium and phosphate binders--did have a fall in ED while trying to get to the bathroom independently. CT head negative for acute changes.  He developed left ptosis, decreased in ability to use RUE and PT evaluation revealed balance deficits with significant left lean on standing with poor safety awareness.  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  2D echo showed EF 65-70% with mild increase in left ventricular wall and grade 1 DD. Carotid dopplers was negative for significant ICA stenosis. Dr. Doy Mince felt that stroke was due to small vessel disease and recommended DAPT x 3 weeks followed by ASA alone.  He continues to have bouts of confusion with hallucinations as well as electrolyte abnormalities. ST recommended downgrading diet to dysphagia 2, nectars due to mild to moderate oropharyngeal dysphagia.  He continues to be limited decreased balance with ataxia, decreased awareness of deficits and impulsivity .  CIR recommended due to functional deficits due to weakness and stroke.     Review of Systems  Eyes: Positive for blurred vision (decreased vision left eye due to cataract ).  Musculoskeletal: Positive for falls (once a week ).  Neurological: Positive for dizziness and weakness.      Past Medical History:  Diagnosis Date  . Anxiety   . BPH (benign  prostatic hyperplasia)   . Chronic kidney disease   . COPD (chronic obstructive pulmonary disease) ([place])   . Depression   . Diabetes mellitus without complication ([place])   . Hyperlipidemia   . Hypertension   . Hypothyroidism   . Nephrolithiasis   . Seizures ([place])     Past Surgical History:  Procedure Laterality Date  . COLONOSCOPY    . COLONOSCOPY WITH PROPOFOL N/A 05/30/2015   Procedure: COLONOSCOPY WITH PROPOFOL;  Surgeon: Manya Silvas, MD;  Location: [facility] ENDOSCOPY;  Service: Endoscopy;  Laterality: N/A;  . COLONOSCOPY WITH PROPOFOL N/A 12/01/2018   Procedure: COLONOSCOPY WITH PROPOFOL;  Surgeon: Toledo, [name], MD;  Location: [facility] ENDOSCOPY;  Service: Gastroenterology;  Laterality: N/A;  . kidney stone    . THYROIDECTOMY    . VARICOCELE EXCISION    . VIDEO BRONCHOSCOPY Bilateral 05/25/2016   Procedure: VIDEO BRONCHOSCOPY WITHOUT FLUORO;  Surgeon: Juanito Doom, MD;  Location: [facility] ENDOSCOPY;  Service: Cardiopulmonary;  Laterality: Bilateral;    Family History  Problem Relation Age of Onset  . Diabetes Mother   . Diabetes Maternal Grandmother   . Diabetes Maternal Grandfather   . Lung cancer Father   . Emphysema Paternal Grandfather     Social History:  Married. Sedentary--ambulates short distances at home but stays in bed most of the day. He  reports that he has been smoking cigarettes. He has a 40.00 pack-year smoking history. He has never used smokeless tobacco. He reports current alcohol use of about 8.0 standard drinks of alcohol per week. He reports that he does not  use drugs.   Allergies: No Known Allergies    Medications Prior to Admission  Medication Sig Dispense Refill  . atorvastatin (LIPITOR) 10 MG tablet Take 10 mg by mouth daily.    . furosemide (LASIX) 80 MG tablet Take 80 mg by mouth daily.    [name] gabapentin (NEURONTIN) 100 MG capsule TAKE 1 CAPSULE BY MOUTH TWICE A DAY FOR ONE WEEK, THEN INCREASE TO 2 CAPSULES TWICE A DAY AND CONTINUE AS DIRECTED      . glipiZIDE (GLUCOTROL XL) 10 MG 24 hr tablet Take 10 mg by mouth daily.    [name] HYDROcodone-acetaminophen (NORCO/VICODIN) 5-325 MG tablet Take 1 tablet by mouth every 4 (four) hours as needed for moderate pain.    [name] insulin glargine (LANTUS) 100 unit/mL SOPN Inject 20 Units into the skin daily.    . insulin lispro (HUMALOG) 100 UNIT/ML KwikPen Inject 4-14 Units into the skin daily. Sliding scale    . levETIRAcetam (KEPPRA) 500 MG tablet Take 500 mg by mouth 2 (two) times a day.    . levothyroxine (SYNTHROID) 200 MCG tablet Take 200 mcg by mouth every morning.    [name] losartan (COZAAR) 100 MG tablet Take 100 mg by mouth daily.    . metoprolol succinate (TOPROL-XL) 50 MG 24 hr tablet Take 1 tablet by mouth daily.    . Pancrelipase, Lip-Prot-Amyl, 24000-76000 units CPEP Take 1 capsule by mouth 3 (three) times daily.    . Semaglutide,0.25 or 0.5MG /DOS, 2 MG/1.5ML SOPN Inject 0.5 mg into the skin once a week.    . traZODone (DESYREL) 50 MG tablet Take 100 mg by mouth at bedtime.    . Amylase-Lipase-Protease (CREON 10 PO) Take by mouth daily as needed.    . calcium carbonate (TUMS) 500 MG chewable tablet Chew 1 tablet (200 mg of elemental calcium total) by mouth 3 (three) times daily with meals. (Patient not taking: Reported on 03/12/2019) 120 tablet 3  . escitalopram (LEXAPRO) 10 MG tablet Take 10 mg by mouth daily.    . potassium chloride SA (K-DUR) 20 MEQ tablet Take 1 tablet (20 mEq total) by mouth 2 (two) times a day. (Patient not taking: Reported on 03/12/2019) 90 tablet 0    Drug Regimen Review  Drug regimen was reviewed and remains appropriate with no significant issues identified  Home: Home Living Family/patient expects to be discharged to:: Private residence Living Arrangements: Spouse/significant other Available Help at Discharge: Family, Available 24 hours/day(spouse; no children) Type of Home: House Home Access: Stairs to enter [employer] of Steps: 6 Entrance Stairs-Rails:  Can reach both Home Layout: One level Bathroom Shower/Tub: Multimedia programmer: Handicapped height Bathroom Accessibility: Yes Home Equipment: Environmental consultant - 2 wheels, [place] - single point, Environmental consultant - standard  Lives With: Spouse   Functional History: Prior Function Level of Independence: Needs assistance Gait / Transfers Assistance Needed: Pt reports that he does not use walkers, states he typically uses SPC for fxl mobility for household distances. ADL's / Homemaking Assistance Needed: Pt states he was able to perfrom BADLs, states that he was driving. States that wife primarily performs cooking/cleaning.  Functional Status:  Mobility: Bed Mobility Overal bed mobility: Needs Assistance Bed Mobility: Supine to Sit Supine to sit: Min assist Sit to supine: Supervision General bed mobility comments: Deferred. Pt up in recliner at start/end of sesison. Transfers Overall transfer level: Needs assistance Equipment used: Rolling walker (2 wheeled) Transfers: Sit to/from Stand Sit to Stand: +2 physical assistance, Min assist, +2 safety/equipment General transfer  comment: Min A for stability upon initial stand Ambulation/Gait Ambulation/Gait assistance: Mod assist, +2 physical assistance Gait Distance (Feet): 30 Feet Assistive device: Rolling walker (2 wheeled) Gait Pattern/deviations: Decreased step length - right, Decreased step length - left, Step-through pattern, Wide base of support, Drifts right/left, Ataxic General Gait Details: Pt impulsive with amb with Mod A for stability and max verbal cues for amb closer to RW with upright posture Gait velocity: decreased    ADL: ADL Overall ADL's : Needs assistance/impaired Eating/Feeding: Sitting, Minimal assistance Eating/Feeding Details (indicate cue type and reason): Pt on modified diet per SLP. Requires nectar thick liquids, but repeatedly requests "regular water" during session. This author declines. Pt continues to have  decreased safety awareness and awareness of deficits. Would benefit from supervision for safety during self-feeding. Grooming: Set up, Sitting, Wash/dry face, Oral care, Minimal assistance, Brushing hair, Moderate assistance, Cueing for safety, Cueing for sequencing, Cueing for compensatory techniques Grooming Details (indicate cue type and reason): Pt performs seated grooming tasks with varying level of assist this date. He continues to have difficulty with dynamic sitting balance and requires moderate assist for tasks that require weight shift/changes in BOS including hair brushing with both arms overhead. He is able to complete oral care given set-up to minimal assist this date. He has poor [place] in BUE with R>L this date. Pt educated on compensatory strategies for holding toothbrush, applying toothpaste, and oral care mgt. No signs/symptoms of aspiration during oral care. Upper Body Bathing: Sitting, Moderate assistance, Cueing for compensatory techniques, Cueing for sequencing, Cueing for safety Lower Body Bathing: Moderate assistance, +2 for safety/equipment, Sitting/lateral leans, Cueing for compensatory techniques, Cueing for sequencing, Cueing for safety Upper Body Dressing : Minimal assistance, Sitting, Cueing for safety, Cueing for sequencing, Cueing for compensatory techniques Lower Body Dressing: Moderate assistance, Sit to/from stand, +2 for physical assistance Lower Body Dressing Details (indicate cue type and reason): Pt continues to have poor dynamic sitting balance. Requires moderate assist for any sit to/from stand tasks including donning lower body clothing. Toilet Transfer: +2 for physical assistance, Stand-pivot, BSC, RW, +2 for safety/equipment Toilet Transfer Details (indicate cue type and reason): Pt notably ataxic, requiring +2 assist for functional mobility this date. Requires moderate assist +2 to maintain standing balance this date. Toileting- Clothing Manipulation and Hygiene:  Moderate assistance, +2 for physical assistance, +2 for safety/equipment, Sit to/from stand Tub/ Shower Transfer: Stand-pivot, +2 for physical assistance, +2 for safety/equipment, Moderate assistance Functional mobility during ADLs: +2 for physical assistance, +2 for safety/equipment, Moderate assistance, Rolling walker General ADL Comments: Pt continues to be functionally limited by ataxic movements, poor balance, and limited safety awareness/awareness of deficits. Continues to require +2 mod A for functional mobility or STS tasks such as LB dressing. Min A to supervision for UB tasks that can be completed in sitting such as dressing and grooming.  Cognition: Cognition Overall Cognitive Status: Impaired/Different from baseline Orientation Level: Oriented to person, Oriented to place, Disoriented to time, Oriented to situation Cognition Arousal/Alertness: Awake/alert Behavior During Therapy: Impulsive Overall Cognitive Status: Impaired/Different from baseline Area of Impairment: Safety/judgement, Problem solving Safety/Judgement: Decreased awareness of safety, Decreased awareness of deficits Problem Solving: Slow processing, Difficulty sequencing, Requires verbal cues General Comments: Poor insight into functional deficits.  Physical Exam: Blood pressure 135/62, pulse 70, temperature 98.1 F (36.7 C), temperature source Oral, resp. rate 18, height 6' (1.829 m), weight (!) 136.5 kg, SpO2 99 %. Physical Exam  Constitutional: He appears well-developed.  HENT:  Head: Normocephalic.  Eyes:  Pupils are equal, round, and reactive to light.  Cardiovascular: Normal rate.  Respiratory: Effort normal.  GI: Soft.  Musculoskeletal:        General: Normal range of motion.     Cervical back: Normal range of motion.  Neurological:  Right HP  Skin: Skin is warm.  Psychiatric: He has a normal mood and affect.    Results for orders placed or performed during the hospital encounter of 03/12/19 (from  the past 48 hour(s))  Glucose, capillary     Status: Abnormal   Collection Time: 03/18/19  1:15 PM  Result Value Ref Range   Glucose-Capillary 233 (H) 70 - 99 mg/dL  Glucose, capillary     Status: Abnormal   Collection Time: 03/18/19  4:25 PM  Result Value Ref Range   Glucose-Capillary 122 (H) 70 - 99 mg/dL  Glucose, capillary     Status: Abnormal   Collection Time: 03/18/19  8:45 PM  Result Value Ref Range   Glucose-Capillary 109 (H) 70 - 99 mg/dL  Glucose, capillary     Status: Abnormal   Collection Time: 03/19/19  7:52 AM  Result Value Ref Range   Glucose-Capillary 140 (H) 70 - 99 mg/dL  Glucose, capillary     Status: Abnormal   Collection Time: 03/19/19 11:34 AM  Result Value Ref Range   Glucose-Capillary 262 (H) 70 - 99 mg/dL  Glucose, capillary     Status: Abnormal   Collection Time: 03/19/19  4:40 PM  Result Value Ref Range   Glucose-Capillary 122 (H) 70 - 99 mg/dL  Glucose, capillary     Status: Abnormal   Collection Time: 03/19/19  8:47 PM  Result Value Ref Range   Glucose-Capillary 217 (H) 70 - 99 mg/dL  CBC     Status: Abnormal   Collection Time: 03/20/19  6:29 AM  Result Value Ref Range   WBC 6.4 4.0 - 10.5 K/uL   RBC 2.43 (L) 4.22 - 5.81 MIL/uL   Hemoglobin 8.1 (L) 13.0 - 17.0 g/dL   HCT 24.0 (L) 39.0 - 52.0 %   MCV 98.8 80.0 - 100.0 fL   MCH 33.3 26.0 - 34.0 pg   MCHC 33.8 30.0 - 36.0 g/dL   RDW 15.8 (H) 11.5 - 15.5 %   Platelets 195 150 - 400 K/uL   nRBC 0.0 0.0 - 0.2 %    Comment: Performed at [facility], [address] [place], [place] [address]  Basic metabolic panel     Status: Abnormal   Collection Time: 03/20/19  6:29 AM  Result Value Ref Range   Sodium 141 135 - 145 mmol/L   Potassium 3.0 (L) 3.5 - 5.1 mmol/L   Chloride 100 98 - 111 mmol/L   CO2 26 22 - 32 mmol/L   Glucose, Bld 207 (H) 70 - 99 mg/dL   BUN 44 (H) 8 - 23 mg/dL   Creatinine, Ser 8.63 (H) 0.61 - 1.24 mg/dL   Calcium 8.6 (L) 8.9 - 10.3 mg/dL   GFR calc non Af Amer 6  (L) >60 mL/min   GFR calc Af Amer 6 (L) >60 mL/min   Anion gap 15 5 - 15    Comment: Performed at [facility], [address] [place], [place] [address]  Magnesium     Status: Abnormal   Collection Time: 03/20/19  6:29 AM  Result Value Ref Range   Magnesium 1.5 (L) 1.7 - 2.4 mg/dL    Comment: Performed at [facility], [address] [place], [place] [address]  Glucose, capillary     Status: Abnormal   Collection Time: 03/20/19  7:26 AM  Result Value Ref Range   Glucose-Capillary 189 (H) 70 - 99 mg/dL   CT CHEST WO CONTRAST  Result Date: 03/18/2019 CLINICAL DATA:  Cough. EXAM: CT CHEST WITHOUT CONTRAST TECHNIQUE: Multidetector CT imaging of the chest was performed following the standard protocol without IV contrast. COMPARISON:  November 05, 2016. FINDINGS: Cardiovascular: Atherosclerosis of thoracic aorta is noted without aneurysm formation. Normal cardiac size. No pericardial effusion. Coronary artery calcifications are noted. Mediastinum/Nodes: No enlarged mediastinal or axillary lymph nodes. Status post thyroidectomy. The trachea and esophagus demonstrate no significant findings. Lungs/Pleura: Lungs are clear. No pleural effusion or pneumothorax. Upper Abdomen: Cholelithiasis is noted. Musculoskeletal: No chest wall mass or suspicious bone lesions identified. IMPRESSION: Status post thyroidectomy. Cholelithiasis. Coronary artery calcifications are noted. No acute abnormality seen in the chest. Aortic Atherosclerosis (ICD10-I70.0). Electronically Signed   By: Marijo Conception M.D.   On: 03/18/2019 12:19       Medical Problem List and Plan: 1.  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  Antithrombotics: -DVT/anticoagulation:  Pharmaceutical: Heparin  -antiplatelet therapy: ASA/PLAVIX X 3 weeks followed by ASA alone.  3. Pain Management: tylenol  prn 4. Mood: LCSW to follow for evaluation and support.   -antipsychotic agents: N/A 5. Neuropsych: This patient is capable of making decisions on his own behalf. 6. Skin/Wound Care: Routine pressure relief measures.  7. Fluids/Electrolytes/Nutrition: Monitor I/O. Has been refusing nectar liquids. Encourage intake.  8. T2DM with neuropathy/nephropathy: Hgb A1c- Was on ozempic, Lantus, Humalog and glucotrol PTA. Now on  9. ESRD- on PD at nights.  On calcitriol and phoslo for  10. Hypokalemia/Hypomagnesemia: Received 2 gram IV magnesium today.  11. Anemia of chronic disease: On aranesp weekly.  12. Chronic diarrhea:Continue creon tid with meals.  14. HTN:  Monitor BP tid--on metoprolol, losartan and low dose amlodipine 15. Chronic dizziness/Orthostatic changes?: Will order orthostatic vitals.  16. Prolonged QT: Will recheck EKG in am.      Bary Leriche, PA-C 03/20/2019

## 2019-03-20 NOTE — Progress Notes (Addendum)
Inpatient Rehabilitation Admissions Coordinator  I have CIR bed at [facility] to admit pt to today. I have notified Dr. Leslye Peer, [place], [place], pt's RN, Danae Chen, patient and wife. I will make the arrangements and assist Acute team in making the arrangements to admit today.  Danne Baxter, RN, MSN Rehab Admissions Coordinator [phone] 03/20/2019 8:59 AM

## 2019-03-20 NOTE — Care Management (Signed)
[facility] Individual Statement of Services  Patient Name:  Future [place]  Date:  03/20/2019  Welcome to the [facility].  Our goal is to provide you with an individualized program based on your diagnosis and situation, designed to meet your specific needs.  With this comprehensive rehabilitation program, you will be expected to participate in at least 3 hours of rehabilitation therapies Monday-Friday, with modified therapy programming on the weekends.  Your rehabilitation program will include the following services:  Physical Therapy (PT), Occupational Therapy (OT), Speech Therapy (ST), 24 hour per day rehabilitation nursing, Neuropsychology, Case Management (Social Worker), Rehabilitation Medicine, Nutrition Services and Pharmacy Services  Weekly team conferences will be held on Wednesdays to discuss your progress.  Your Social Worker will talk with you frequently to get your input and to update you on team discussions.  Team conferences with you and your family in attendance may also be held.  Expected length of stay:  2-2.5 weeks Overall anticipated outcome: Supervision - Contact Guard Assist goals  Depending on your progress and recovery, your program may change. Your Social Worker will coordinate services and will keep you informed of any changes. Your Social Worker's name and contact numbers are listed  below.  The following services may also be recommended but are not provided by the [facility] will be made to provide these services after discharge if needed.  Arrangements include referral to agencies that provide these services.  Your insurance has been verified to be:  [employer] Your primary doctor is:  Dr. [name]  Pertinent information will be shared with your doctor and your insurance  company.  Social Worker:  [place], [place] or ([phone]   Information discussed with and copy given to patient by: Margarito Liner, 03/20/2019, 3:23 PM

## 2019-03-20 NOTE — Progress Notes (Signed)
Kyle [name] to be D/C'd to CIR per MD order. Wife notified.  Allergies as of 03/20/2019   No Known Allergies     Medication List    STOP taking these medications   calcium carbonate 500 MG chewable tablet Commonly known as: Tums   CREON 10 PO   escitalopram 10 MG tablet Commonly known as: LEXAPRO   furosemide 80 MG tablet Commonly known as: LASIX   glipiZIDE 10 MG 24 hr tablet Commonly known as: GLUCOTROL XL   HYDROcodone-acetaminophen 5-325 MG tablet Commonly known as: NORCO/VICODIN   potassium chloride SA 20 MEQ tablet Commonly known as: KLOR-CON   Semaglutide(0.25 or 0.5MG /DOS) 2 MG/1.5ML Sopn     TAKE these medications   amLODipine 2.5 MG tablet Commonly known as: NORVASC Take 1 tablet (2.5 mg total) by mouth daily.   aspirin 81 MG EC tablet Take 1 tablet (81 mg total) by mouth daily.   atorvastatin 80 MG tablet Commonly known as: Lipitor Take 1 tablet (80 mg total) by mouth daily. What changed:   medication strength  how much to take   calcitRIOL 0.5 MCG capsule Commonly known as: ROCALTROL Take 1 capsule (0.5 mcg total) by mouth daily. Start taking on: March 21, 2019   calcium acetate 667 MG capsule Commonly known as: PHOSLO Take 2 capsules (1,334 mg total) by mouth 3 (three) times daily with meals.   clopidogrel 75 MG tablet Commonly known as: PLAVIX Take 1 tablet (75 mg total) by mouth daily for 18 days.   feeding supplement (NEPRO CARB STEADY) Liqd Take 237 mLs by mouth 2 (two) times daily between meals.   gabapentin 100 MG capsule Commonly known as: NEURONTIN TAKE 1 CAPSULE BY MOUTH TWICE A DAY FOR ONE WEEK, THEN INCREASE TO 2 CAPSULES TWICE A DAY AND CONTINUE AS DIRECTED   gentamicin cream 0.1 % Commonly known as: GARAMYCIN Apply 1 application topically daily.   insulin glargine 100 unit/mL Sopn Commonly known as: LANTUS Inject 0.05 mLs (5 Units total) into the skin at bedtime. What changed:   how much to take  when to  take this   insulin lispro 100 UNIT/ML KwikPen Commonly known as: HUMALOG 2 units subcutaneous injection for sugars 200-250, 3 units for sugars 251-300; 4 units for sugars greater than 301 What changed:   how much to take  how to take this  when to take this  additional instructions   levETIRAcetam 500 MG tablet Commonly known as: KEPPRA Take 500 mg by mouth 2 (two) times a day.   levothyroxine 200 MCG tablet Commonly known as: SYNTHROID Take 200 mcg by mouth every morning.   losartan 100 MG tablet Commonly known as: COZAAR Take 100 mg by mouth daily.   metoprolol succinate 50 MG 24 hr tablet Commonly known as: TOPROL-XL Take 1 tablet by mouth daily.   multivitamin Tabs tablet Take 1 tablet by mouth at bedtime.   Pancrelipase (Lip-Prot-Amyl) 24000-76000 units Cpep Take 1 capsule by mouth 3 (three) times daily.   sodium bicarbonate 650 MG tablet Take 1 tablet (650 mg total) by mouth 2 (two) times daily.   traZODone 50 MG tablet Commonly known as: DESYREL Take 100 mg by mouth at bedtime.       Vitals:   03/20/19 0453 03/20/19 0729  BP: (!) 152/67 135/62  Pulse: 73 70  Resp: 20 18  Temp: 98.3 F (36.8 C) 98.1 F (36.7 C)  SpO2: 99% 99%    IV catheter discontinued intact. Site without signs  and symptoms of complications. Dressing and pressure applied. Pt denies pain at this time. No complaints noted. Report given to Lanae Boast,  Patient escorted via stretcher, and transfer to [facility] via private [facility].  Rolley Sims

## 2019-03-20 NOTE — Progress Notes (Signed)
[facility] Tx completed, tolerated well.    03/20/19 0830  Completion  Effluent Appearance Clear;Yellow  Treatment Status Complete  Fluid Balance - CCPD  Total Output for Exchanges (mL) 12781 ml  Procedure Comments  Tolerated treatment well? Yes  Peritoneal Dialysis Comments Net UF 238mL  Hand-Off documentation  Report given to (Full Name) Danae Chen RN   Report received from (Full Name) Beatris Ship, RN

## 2019-03-20 NOTE — Progress Notes (Signed)
Meredith Staggers, MD  Physician  Physical Medicine and Rehabilitation  PMR Pre-admission  Signed  Date of Service:  03/17/2019  1:45 PM      Related encounter: ED to Hosp-Admission (Current) from 03/12/2019 in [place] (2A)      Signed        Show:Clear all [x] Manual[x] Template[] Copied  Added by: [x] Cristina Gong, RN[x] Meredith Staggers, MD  [] Hover for details PMR Admission Coordinator Pre-Admission Assessment   Patient: Kyle [name] is an 69 y.o., male MRN: [id number] DOB: 1950/06/07 Height: 6' (182.9 cm) Weight: (!) 136.5 kg   Insurance Information HMO:     PPO: yes     PCP:      IPA:      80/20:      OTHER:  PRIMARY: [employer] medicare      Policy#: [id number]      Subscriber: pt CM Name: Dot      Phone#: [phone] ext [phone]     Fax#: [phone] Pre-Cert#: [id number] approved for 7 days with f/u Lestine Box ext [phone] same fax    Employer:  Benefits:  Phone #: [phone]     Name: 1/21 Eff. Date: 02/27/2019     Deduct: none      Out of Pocket Max: $4000      Life Max: none CIR: $160 co pay per day days 1 until 10      SNF: no co pay per day days 1 until 20; $50 copy per day days 21 until 100 Outpatient: $20 per visit      Co-Pay: visits per medical neccesity Home Health: 100%      Co-Pay: visit per medical neccesity DME: 80%     Co-Pay: 20% Providers: in network    SECONDARY: none      Medicaid Application Date:       Case Manager:  Disability Application Date:       Case Worker:    The "Data Collection Information Summary" for patients in Inpatient Rehabilitation Facilities with attached "Privacy Act [place] Records" was provided and verbally reviewed with: Patient and Family   Emergency Contact Information         Contact Information     Name Relation Home Work Mobile    Tucciarone,[name] R Spouse [phone]   [phone]         Current Medical History  Patient Admitting Diagnosis: CVA     History of Present Illness: 69 year old male with medical history significant for seizures, ESRD on nightly CCPD, COPD, HTN , Diabetes type 2. Patient followed with GI for persistent diarrhea and bowel incontinence. He had routine blood work done and noted to have significant hypocalcemia and directed to ED for admission Patient takes Tums TID with meals at home. He reports he has been feeling more confused and weak although this has been going on since he started dialysis about 15 months ago. He has noted increased falls at home weekly and was evaluated by Neurology as an outpatient.    Neurology consulted. 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 Some right dysmetria as well . 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 Etiology felt likely small vessel disease., Patient on Lipitor but no antiplatelet therapy prior to admit. Carotid dopplers show no evidence of significant stenosis. Echo showed no cardiac source of emboli with an EF of 65 to 70%. LDL 48, A1c 7.1. BP controlled.  Recommend dual antiplatelet therapy with ASA 81 mg and Plavix 75 mg for 3 weeks with change to ASA 81 mg alone as monotherapy after that time. Recommend smoking cessation.    Complete NIHSS TOTAL: 11   Patient's medical record from [facility] has been reviewed by the rehabilitation admission coordinator and physician.   Past Medical History      Past Medical History:  Diagnosis Date  . Anxiety    . BPH (benign prostatic hyperplasia)    . Chronic kidney disease    . COPD (chronic obstructive pulmonary disease) ([place])    . Depression    . Diabetes mellitus without complication ([place])    . Hyperlipidemia    . Hypertension    . Hypothyroidism    . Nephrolithiasis    . Seizures ([place])        Family History   family history includes Diabetes in his maternal grandfather, maternal grandmother, and mother; Emphysema in his paternal grandfather; Lung  cancer in his father.   Prior Rehab/Hospitalizations Has the patient had prior rehab or hospitalizations prior to admission? Yes   Has the patient had major surgery during 100 days prior to admission? No               Current Medications   Current Facility-Administered Medications:  .  amLODipine (NORVASC) tablet 2.5 mg, 2.5 mg, Oral, Daily, Leslye Peer, Richard, MD, 2.5 mg at 03/20/19 0909 .  aspirin EC tablet 81 mg, 81 mg, Oral, Daily, Sharion Settler, NP, 81 mg at 03/20/19 0908 .  atorvastatin (LIPITOR) tablet 80 mg, 80 mg, Oral, Daily, Sharion Settler, NP, 80 mg at 03/19/19 1744 .  calcitRIOL (ROCALTROL) capsule 0.5 mcg, 0.5 mcg, Oral, Daily, Murlean Iba, MD, 0.5 mcg at 03/20/19 0909 .  calcium acetate (PHOSLO) capsule 1,334 mg, 1,334 mg, Oral, TID WC, Tu, Ching T, DO, 1,334 mg at 03/20/19 0909 .  calcium carbonate (OS-CAL - dosed in mg of elemental calcium) tablet 1,250 mg, 1,250 mg, Oral, TID WC, Annita Brod, MD, 1,250 mg at 03/20/19 0910 .  clopidogrel (PLAVIX) tablet 75 mg, 75 mg, Oral, Daily, Sharion Settler, NP, 75 mg at 03/20/19 0908 .  dialysis solution 1.5% low-MG/low-CA dianeal solution, , Intraperitoneal, Q24H, Kolluru, Sarath, MD, Stopped at 03/18/19 1126 .  epoetin alfa (EPOGEN) injection 10,000 Units, 10,000 Units, Subcutaneous, Weekly, Murlean Iba, MD, 10,000 Units at 03/15/19 0940 .  feeding supplement (NEPRO CARB STEADY) liquid 237 mL, 237 mL, Oral, BID BM, Leslye Peer, Richard, MD, 237 mL at 03/20/19 0910 .  gabapentin (NEURONTIN) capsule 100 mg, 100 mg, Oral, BID, Leslye Peer, Richard, MD, 100 mg at 03/20/19 0909 .  gentamicin cream (GARAMYCIN) 0.1 % 1 application, 1 application, Topical, Daily, Kolluru, Sarath, MD, 1 application at 38/10/17 0910 .  heparin injection 5,000 Units, 5,000 Units, Subcutaneous, Q8H, Tu, Ching T, DO, 5,000 Units at 03/20/19 5102 .  insulin aspart (novoLOG) injection 0-15 Units, 0-15 Units, Subcutaneous, TID WC, Tu, Ching T, DO, 3 Units at  03/20/19 0908 .  levETIRAcetam (KEPPRA) tablet 500 mg, 500 mg, Oral, BID, Tu, Ching T, DO, 500 mg at 03/20/19 0909 .  levothyroxine (SYNTHROID) tablet 200 mcg, 200 mcg, Oral, q morning - 10a, Tu, Ching T, DO, 200 mcg at 03/20/19 5852 .  lipase/protease/amylase (CREON) capsule 24,000 Units, 24,000 Units, Oral, TID, Tu, Ching T, DO, 24,000 Units at 03/20/19 0910 .  losartan (COZAAR) tablet 100 mg, 100 mg, Oral, Daily, Tu, Ching T, DO, 100 mg at 03/20/19 0908 .  magnesium  sulfate IVPB 2 g 50 mL, 2 g, Intravenous, STAT, Wieting, Richard, MD, Last Rate: 50 mL/hr at 03/20/19 0931, 2 g at 03/20/19 0931 .  metoprolol succinate (TOPROL-XL) 24 hr tablet 50 mg, 50 mg, Oral, Daily, Tu, Ching T, DO, 50 mg at 03/20/19 0908 .  multivitamin (RENA-VIT) tablet 1 tablet, 1 tablet, Oral, QHS, Loletha Grayer, MD, 1 tablet at 03/20/19 0909 .  sodium bicarbonate tablet 650 mg, 650 mg, Oral, BID, Kolluru, Sarath, MD, 650 mg at 03/20/19 0908   Patients Current Diet:     Diet Order                      Diet renal with fluid restriction Fluid restriction: 1200 mL Fluid; Room service appropriate? Yes with Assist; Fluid consistency: Nectar Thick  Diet effective now               1/21 D2 with nectar thick whole meds with puree   Precautions / Restrictions Precautions Precautions: Fall Restrictions Weight Bearing Restrictions: No    Has the patient had 2 or more falls or a fall with injury in the past year? Yes Wife reports about 10 falls over past 6 months   patient fell out of bed in ED hitting his head while attempting to get out of bed alone.    Prior Activity Level Limited Community (1-2x/wk): independent; drove; did own CCPD   Prior Functional Level Self Care: Did the patient need help bathing, dressing, using the toilet or eating? Needed some help   Indoor Mobility: Did the patient need assistance with walking from room to room (with or without device)? Independent   Stairs: Did the patient need  assistance with internal or external stairs (with or without device)? Independent   Functional Cognition: Did the patient need help planning regular tasks such as shopping or remembering to take medications? [place] / [place] Devices/Equipment: Environmental consultant (specify type) Home Equipment: Walker - 2 wheels, Cane - single point, Environmental consultant - standard   Prior Device Use: Indicate devices/aids used by the patient prior to current illness, exacerbation or injury? cane   Current Functional Level Cognition   Overall Cognitive Status: Impaired/Different from baseline Orientation Level: Oriented to person, Oriented to place Safety/Judgement: Decreased awareness of safety, Decreased awareness of deficits General Comments: Poor insight into functional deficits.    Extremity Assessment (includes Sensation/Coordination)   Upper Extremity Assessment: RUE deficits/detail, LUE deficits/detail RUE Deficits / Details: shld, elbow, grip 4-/5, dysdiadokinesia detected-R hand does not move in sync with L. requires increased time to oppose all fingertips RUE Coordination: decreased fine motor(coordination) LUE Deficits / Details: shld, elbow, grip 4+/5  Lower Extremity Assessment: Defer to PT evaluation, Generalized weakness     ADLs   Overall ADL's : Needs assistance/impaired Eating/Feeding: Sitting, Minimal assistance Eating/Feeding Details (indicate cue type and reason): Pt on modified diet per SLP. Requires nectar thick liquids, but repeatedly requests "regular water" during session. This author declines. Pt continues to have decreased safety awareness and awareness of deficits. Would benefit from supervision for safety during self-feeding. Grooming: Set up, Sitting, Wash/dry face, Oral care, Minimal assistance, Brushing hair, Moderate assistance, Cueing for safety, Cueing for sequencing, Cueing for compensatory techniques Grooming Details (indicate cue type and  reason): Pt performs seated grooming tasks with varying level of assist this date. He continues to have difficulty with dynamic sitting balance and requires moderate assist for tasks that require weight shift/changes in BOS including hair  brushing with both arms overhead. He is able to complete oral care given set-up to minimal assist this date. He has poor [place] in BUE with R>L this date. Pt educated on compensatory strategies for holding toothbrush, applying toothpaste, and oral care mgt. No signs/symptoms of aspiration during oral care. Upper Body Bathing: Sitting, Moderate assistance, Cueing for compensatory techniques, Cueing for sequencing, Cueing for safety Lower Body Bathing: Moderate assistance, +2 for safety/equipment, Sitting/lateral leans, Cueing for compensatory techniques, Cueing for sequencing, Cueing for safety Upper Body Dressing : Minimal assistance, Sitting, Cueing for safety, Cueing for sequencing, Cueing for compensatory techniques Lower Body Dressing: Moderate assistance, Sit to/from stand, +2 for physical assistance Lower Body Dressing Details (indicate cue type and reason): Pt continues to have poor dynamic sitting balance. Requires moderate assist for any sit to/from stand tasks including donning lower body clothing. Toilet Transfer: +2 for physical assistance, Stand-pivot, BSC, RW, +2 for safety/equipment Toilet Transfer Details (indicate cue type and reason): Pt notably ataxic, requiring +2 assist for functional mobility this date. Requires moderate assist +2 to maintain standing balance this date. Toileting- Clothing Manipulation and Hygiene: Moderate assistance, +2 for physical assistance, +2 for safety/equipment, Sit to/from stand Tub/ Shower Transfer: Stand-pivot, +2 for physical assistance, +2 for safety/equipment, Moderate assistance Functional mobility during ADLs: +2 for physical assistance, +2 for safety/equipment, Moderate assistance, Rolling walker General ADL Comments:  Pt continues to be functionally limited by ataxic movements, poor balance, and limited safety awareness/awareness of deficits. Continues to require +2 mod A for functional mobility or STS tasks such as LB dressing. Min A to supervision for UB tasks that can be completed in sitting such as dressing and grooming.     Mobility   Overal bed mobility: Needs Assistance Bed Mobility: Supine to Sit Supine to sit: Min assist Sit to supine: Supervision General bed mobility comments: Deferred. Pt up in recliner at start/end of sesison.     Transfers   Overall transfer level: Needs assistance Equipment used: Rolling walker (2 wheeled) Transfers: Sit to/from Stand Sit to Stand: +2 physical assistance, Min assist, +2 safety/equipment General transfer comment: Min A for stability upon initial stand     Ambulation / Gait / Stairs / Wheelchair Mobility   Ambulation/Gait Ambulation/Gait assistance: Mod assist, +2 physical assistance Gait Distance (Feet): 30 Feet Assistive device: Rolling walker (2 wheeled) Gait Pattern/deviations: Decreased step length - right, Decreased step length - left, Step-through pattern, Wide base of support, Drifts right/left, Ataxic General Gait Details: Pt impulsive with amb with Mod A for stability and max verbal cues for amb closer to RW with upright posture Gait velocity: decreased     Posture / Balance Dynamic Sitting Balance Sitting balance - Comments: Pt continues to demonstrate poor sitting balance with back unsupported. Requires cueing and at least 1 UE support t/o session to maintain upright neutral posture. Balance Overall balance assessment: Needs assistance, History of Falls Sitting-balance support: Feet supported, Bilateral upper extremity supported, Single extremity supported Sitting balance-Leahy Scale: Fair Sitting balance - Comments: Pt continues to demonstrate poor sitting balance with back unsupported. Requires cueing and at least 1 UE support t/o session  to maintain upright neutral posture. Postural control: Posterior lean, Right lateral lean Standing balance support: Bilateral upper extremity supported, During functional activity Standing balance-Leahy Scale: Poor Standing balance comment: Mod A for stability     Special needs/care consideration BiPAP/CPAP  CPM  Continuous Drip IV  Dialysis   CCPD nightly; dry during the day; patient does himself at home; wife  not trained Life Vest  Oxygen  Special Bed  Trach Size  Wound Vac  Skin  Abrasions ankles and feet bilaterally; pd catheter to abdomen; skin tear to left anterior ankle Bowel mgmt:  Some incontinence LBM 1/21 Bladder mgmt: some urinary incontinence Diabetic mgmt: type 2 diabetes; Hgb A1c 7.1 Behavioral consideration  Chemo/radiation  Designated visitor is wife, [name]    Previous Home Environment  Living Arrangements: Spouse/significant other  Lives With: Spouse Available Help at Discharge: Family, Available 24 hours/day(spouse; no children) Type of Home: House Home Layout: One level Home Access: Stairs to enter Entrance Stairs-Rails: Can reach both Entrance Stairs-Number of Steps: 6 Bathroom Shower/Tub: Multimedia programmer: Handicapped height Bathroom Accessibility: Yes How Accessible: Accessible via [place]: No   Discharge Living Setting Plans for Discharge Living Setting: Patient's home, Lives with (comment)(spouse) Type of Home at Discharge: [place]: One level Discharge Home Access: Stairs to enter Entrance Stairs-Rails: Left, Right, Can reach both Entrance Stairs-Number of Steps: 6 Discharge Bathroom Shower/Tub: Walk-in shower Discharge Bathroom Toilet: Handicapped height Discharge Bathroom Accessibility: Yes How Accessible: Accessible via walker Does the patient have any problems obtaining your medications?: No   Social/Family/Support Systems Patient Roles: Spouse Contact Information: wife,  [name] Anticipated Caregiver: wife Anticipated Ambulance person Information: see above Ability/Limitations of Caregiver: can provide min assist Caregiver Availability: 24/7 Discharge Plan Discussed with Primary Caregiver: Yes Is Caregiver In Agreement with Plan?: Yes Does Caregiver/Family have Issues with Lodging/Transportation while Pt is in Rehab?: No   Goals/Additional Needs Patient/Family Goal for Rehab: Mod I to sueprvision with PT, supervision to min OT, superivison SLP Expected length of stay: ELOS 10 to 14 days Equipment Needs: CCPD Shanon Payor for 15 months Pt/Family Agrees to Admission and willing to participate: Yes Program Orientation Provided & Reviewed with Pt/Caregiver Including Roles  & Responsibilities: Yes   Decrease burden of Care through IP rehab admission:    Possible need for SNF placement upon discharge: SNF not anticipated nor can patient go to SNF with peritoneal dialysis; would have to switch to hemodialysis for SNF placement. Patient has never been on hemodialysis before.   Patient Condition: I have reviewed medical records from [facility], spoken with CM, and patient and spouse. I discussed via phone for inpatient rehabilitation assessment.  Patient will benefit from ongoing PT, OT and SLP, can actively participate in 3 hours of therapy a day 5 days of the week, and can make measurable gains during the admission.  Patient will also benefit from the coordinated team approach during an Inpatient Acute Rehabilitation admission.  The patient will receive intensive therapy as well as Rehabilitation physician, nursing, social worker, and care management interventions.  Due to bladder management, bowel management, safety, skin/wound care, disease management, medication administration, pain management and patient education the patient requires 24 hour a day rehabilitation nursing.  The patient is currently Mod assist with mobility and basic ADLs.  Discharge setting and therapy  post discharge at home with home health is anticipated.  Patient has agreed to participate in the Acute Inpatient Rehabilitation Program and will admit today.   Preadmission Screen Completed By:  Cleatrice Burke, 03/20/2019 9:58 AM ______________________________________________________________________   Discussed status with Dr. Naaman Plummer  on  03/20/2019  at  1000 and received approval for admission today.   Admission Coordinator:  Cleatrice Burke, RN, time  1000 Date  03/20/2019    Assessment/Plan: Diagnosis: left midbrain infarct 1. Does the need for close, 24 hr/day Medical  supervision in concert with the patient's rehab needs make it unreasonable for this patient to be served in a less intensive setting? Yes 2. Co-Morbidities requiring supervision/potential complications: sz d/o, ESRD on PD, COPD, HTN, DM 3. Due to bladder management, bowel management, safety, skin/wound care, disease management, medication administration, pain management and patient education, does the patient require 24 hr/day rehab nursing? Yes 4. Does the patient require coordinated care of a physician, rehab nurse, PT, OT, and SLP to address physical and functional deficits in the context of the above medical diagnosis(es)? Yes Addressing deficits in the following areas: balance, endurance, locomotion, strength, transferring, bowel/bladder control, bathing, dressing, feeding, grooming, toileting, cognition, speech and psychosocial support 5. Can the patient actively participate in an intensive therapy program of at least 3 hrs of therapy 5 days a week? Yes 6. The potential for patient to make measurable gains while on inpatient rehab is excellent 7. Anticipated functional outcomes upon discharge from inpatient rehab: modified independent and supervision PT, supervision and min assist OT, supervision SLP 8. Estimated rehab length of stay to reach the above functional goals is: 10-14 days 9. Anticipated discharge  destination: Home 10. Overall Rehab/Functional Prognosis: excellent     MD Signature: Meredith Staggers, MD, [facility] 03/20/2019         Revision History

## 2019-03-20 NOTE — Progress Notes (Signed)
Post CCPD Assessment    03/20/19 0832  Neurological  Level of Consciousness Alert  Orientation Level Oriented to person;Oriented to place  Respiratory  Respiratory Pattern Regular;Unlabored  Chest Assessment Chest expansion symmetrical  Bilateral Breath Sounds Diminished  Cough None  Cardiac  Pulse Regular  Heart Sounds S1, S2  Vascular  R Radial Pulse +2  L Radial Pulse +2  Edema Generalized  Generalized Edema None  Psychosocial  Psychosocial (WDL) WDL  Patient Behaviors Cooperative;Calm

## 2019-03-20 NOTE — Progress Notes (Signed)
[place], [place] 03/20/19  Subjective:   Hospital day # 7  Denies any acute shortness of breath Problem with PD draining overnight.   sleepy this morning  Renal: 01/21 0701 - 01/22 0700 In: 76195 [P.O.:360] Out: 0    Objective:  Vital signs in last 24 hours:  Temp:  [97.4 F (36.3 C)-98.3 F (36.8 C)] 98.1 F (36.7 C) (01/22 0729) Pulse Rate:  [70-76] 70 (01/22 0729) Resp:  [18-20] 18 (01/22 0729) BP: (135-152)/(62-87) 135/62 (01/22 0729) SpO2:  [99 %-100 %] 99 % (01/22 0729)  Weight change:  Filed Weights   03/12/19 1924 03/15/19 0530 03/18/19 0516  Weight: 93.9 kg (!) 201.6 kg (!) 136.5 kg    Intake/Output:    Intake/Output Summary (Last 24 hours) at 03/20/2019 1403 Last data filed at 03/20/2019 1300 Gross per 24 hour  Intake 12620 ml  Output 12781 ml  Net -161 ml     Physical Exam: General:  No acute distress, laying in the bed  HEENT  anicteric, moist oral mucous membranes, left ptosis  Pulm/lungs  normal breathing effort, clear to auscultation  CVS/Heart  no rub or gallop  Abdomen:   Soft, nontender, PD catheter in place  Extremities:  No edema  Neurologic:  sleepy but Able to answer Qs appropriately  Skin:  No acute rashes  Access:  PD catheter       Basic Metabolic Panel:  Recent Labs  Lab 03/14/19 0623 03/16/19 1443 03/20/19 0629  NA 137 141 141  K 3.6 3.5 3.0*  CL 102 102 100  CO2 19* 24 26  GLUCOSE 233* 73 207*  BUN 59* 50* 44*  CREATININE 9.39* 9.22* 8.63*  CALCIUM 6.5* 8.1* 8.6*  MG 1.8  --  1.5*  PHOS 9.0* 9.5*  --      CBC: Recent Labs  Lab 03/14/19 0623 03/20/19 0629  WBC 5.6 6.4  HGB 8.3* 8.1*  HCT 24.9* 24.0*  MCV 96.5 98.8  PLT 178 195     No results found for: HEPBSAG, HEPBSAB, HEPBIGM    Microbiology:  Recent Results (from the past 240 hour(s))  SARS CORONAVIRUS 2 (TAT 6-24 HRS) Nasopharyngeal Nasopharyngeal Swab     Status: None   Collection Time: 03/12/19  9:31 PM   Specimen: Nasopharyngeal Swab  Result Value Ref Range Status   SARS Coronavirus 2 NEGATIVE NEGATIVE Final    Comment: (NOTE) SARS-CoV-2 target nucleic acids are NOT DETECTED. The SARS-CoV-2 RNA is generally detectable in upper and lower respiratory specimens during the acute phase of infection. Negative results do not preclude SARS-CoV-2 infection, do not rule out co-infections with other pathogens, and should not be used as the sole basis for treatment or other patient management decisions. Negative results must be combined with clinical observations, patient history, and epidemiological information. The expected result is Negative. Fact Sheet for Patients: SugarRoll.be Fact Sheet for Healthcare Providers: https://www.woods-mathews.com/ This test is not yet approved or cleared by the [place] FDA and  has been authorized for detection and/or diagnosis of SARS-CoV-2 by FDA under an Emergency Use Authorization (EUA). This EUA will remain  in effect (meaning this test can be used) for the duration of the COVID-19 declaration under Section 56 4(b)(1) of the Act, 21 U.S.C. section 360bbb-3(b)(1), unless the authorization is terminated or revoked sooner. Performed at [facility], [place] [address] [place], [place] [address]     Coagulation Studies: No results for input(s): LABPROT, INR in the last 72 hours.  Urinalysis: No results for input(s): COLORURINE, LABSPEC, PHURINE, GLUCOSEU, HGBUR, BILIRUBINUR, KETONESUR, PROTEINUR, UROBILINOGEN, NITRITE, LEUKOCYTESUR in the last 72 hours.  Invalid input(s): APPERANCEUR    Imaging: No results found.   Medications:   . dialysis solution 1.5% low-MG/low-CA Stopped (03/18/19 1126)   . amLODipine  2.5 mg Oral Daily  . aspirin EC  81 mg Oral Daily  . atorvastatin  80 mg Oral Daily  . calcitRIOL  0.5 mcg Oral Daily  . calcium acetate  1,334 mg Oral TID WC  . calcium carbonate  1,250 mg  Oral TID WC  . clopidogrel  75 mg Oral Daily  . epoetin (EPOGEN/PROCRIT) injection  10,000 Units Subcutaneous Weekly  . feeding supplement (NEPRO CARB STEADY)  237 mL Oral BID BM  . gabapentin  100 mg Oral BID  . gentamicin cream  1 application Topical Daily  . heparin  5,000 Units Subcutaneous Q8H  . insulin aspart  0-15 Units Subcutaneous TID WC  . levETIRAcetam  500 mg Oral BID  . levothyroxine  200 mcg Oral q morning - 10a  . lipase/protease/amylase  24,000 Units Oral TID  . losartan  100 mg Oral Daily  . metoprolol succinate  50 mg Oral Daily  . multivitamin  1 tablet Oral QHS  . sodium bicarbonate  650 mg Oral BID     Assessment/ Plan:  69 y.o. male with end stage renal disease on peritoneal dialysis, hypertension, diabetes mellitus type II, seizure disorder, nephrolithiasis, hypothyroidism, hyperlipidemia, depression, BPH, COPD  admitted on 03/12/2019 for Hypocalcemia [E83.51] Hypomagnesemia [E83.42] Prolonged Q-T interval on ECG [R94.31] Generalized weakness [R53.1]  [facility] 94 kg CCPD 9 hours 5 exchanges 257mL fills  Dry during daytime  #End-stage renal disease At present, continue CCPD based on home regimen Discharge planning: If patient is sent to SNF/rehab, he will need to convert to hemodialysis with PermCath placement  Inpatient rehab is being considered   #Secondary hyperparathyroidism with severe hypocalcemia Lab Results  Component Value Date   PTH 91 (H) 03/13/2019   CALCIUM 8.6 (L) 03/20/2019   PHOS 9.5 (H) 03/16/2019   High phosphorus and low calcium is noted Encourage compliance with binders-PhosLo Continue oral calcium with vitamin D, calcitriol  #Anemia of chronic kidney disease Lab Results  Component Value Date   HGB 8.1 (L) 03/20/2019  Continue Epogen subcu weekly (Saturday)  # 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 Any transcription errors are unintentional

## 2019-03-20 NOTE — Progress Notes (Signed)
Pt was admitted to the IR unit from [facility]. Pt was given information on unit policies and procedures. All questions were answered. Pt is resting comfortably, states no pain, bed alarm on and call bell within reach. Doy Hutching, LPN

## 2019-03-20 NOTE — Discharge Summary (Signed)
[place] at [place] NAME: Kyle [name]    MR#:  [id number]  DATE OF BIRTH:  10-Jan-1951  DATE OF ADMISSION:  03/12/2019 ADMITTING PHYSICIAN: Caren Griffins, MD  DATE OF DISCHARGE: 03/19/2019  PRIMARY CARE PHYSICIAN: Idelle Crouch, MD    ADMISSION DIAGNOSIS:  Hypocalcemia [E83.51] Hypomagnesemia [E83.42] Prolonged Q-T interval on ECG [R94.31] Generalized weakness [R53.1]  DISCHARGE DIAGNOSIS:  Acute stroke  SECONDARY DIAGNOSIS:   Past Medical History:  Diagnosis Date  . Anxiety   . BPH (benign prostatic hyperplasia)   . Chronic kidney disease   . COPD (chronic obstructive pulmonary disease) ([place])   . Depression   . Diabetes mellitus without complication ([place])   . Hyperlipidemia   . Hypertension   . Hypothyroidism   . Nephrolithiasis   . Seizures ([place])     HOSPITAL COURSE:   1.  Acute left midbrain stroke.  The patient has difficulty with balance and ptosis of left eyelid.  Patient is on aspirin daily.  The patient will be on Plavix for another 18 days and then that can be stopped and used aspirin alone.  Patient was changed to high-dose Lipitor.  LDL very low at 48.  Patient will be transferred to acute rehab for balance treatment.  Patient will likely need ophthalmology appointment as outpatient to deal with his ptosis if this does not improve. 2.  End-stage renal disease on peritoneal dialysis as per nephrology recommend nephrology consultation at rehab. 3.  Hypocalcemia, hypophosphatemia and hypomagnesemia this can be modified with dialysis fluid.  I will give 2 g of IV magnesium here.  4.  Type 2 diabetes mellitus.  Last hemoglobin A1c 7.1.  Patient sugars have been up and down will start very low-dose Lantus 5 units at night and short acting sliding scale insulin prior to meals. 5.  Seizure disorder requiring Keppra 6.  Hypothyroidism unspecified on levothyroxine 7.  Essential hypertension on losartan and metoprolol  and Norvasc. 8.  Diabetic neuropathy on gabapentin  DISCHARGE CONDITIONS:   [place]  CONSULTS OBTAINED:  Treatment Team:  Alexis Goodell, MD  DRUG ALLERGIES:  No Known Allergies  DISCHARGE MEDICATIONS:   Allergies as of 03/20/2019   No Known Allergies     Medication List    STOP taking these medications   calcium carbonate 500 MG chewable tablet Commonly known as: Tums   CREON 10 PO   escitalopram 10 MG tablet Commonly known as: LEXAPRO   furosemide 80 MG tablet Commonly known as: LASIX   glipiZIDE 10 MG 24 hr tablet Commonly known as: GLUCOTROL XL   HYDROcodone-acetaminophen 5-325 MG tablet Commonly known as: NORCO/VICODIN   potassium chloride SA 20 MEQ tablet Commonly known as: KLOR-CON   Semaglutide(0.25 or 0.5MG /DOS) 2 MG/1.5ML Sopn     TAKE these medications   amLODipine 2.5 MG tablet Commonly known as: NORVASC Take 1 tablet (2.5 mg total) by mouth daily.   aspirin 81 MG EC tablet Take 1 tablet (81 mg total) by mouth daily.   atorvastatin 10 MG tablet Commonly known as: LIPITOR Take 10 mg by mouth daily.   calcitRIOL 0.5 MCG capsule Commonly known as: ROCALTROL Take 1 capsule (0.5 mcg total) by mouth daily. Start taking on: March 21, 2019   calcium acetate 667 MG capsule Commonly known as: PHOSLO Take 2 capsules (1,334 mg total) by mouth 3 (three) times daily with meals.   clopidogrel 75 MG tablet Commonly known as: PLAVIX Take 1 tablet (75 mg  total) by mouth daily for 18 days.   feeding supplement (NEPRO CARB STEADY) Liqd Take 237 mLs by mouth 2 (two) times daily between meals.   gabapentin 100 MG capsule Commonly known as: NEURONTIN TAKE 1 CAPSULE BY MOUTH TWICE A DAY FOR ONE WEEK, THEN INCREASE TO 2 CAPSULES TWICE A DAY AND CONTINUE AS DIRECTED   gentamicin cream 0.1 % Commonly known as: GARAMYCIN Apply 1 application topically daily.   insulin glargine 100 unit/mL Sopn Commonly known as: LANTUS Inject 0.05 mLs (5 Units total)  into the skin at bedtime. What changed:   how much to take  when to take this   insulin lispro 100 UNIT/ML KwikPen Commonly known as: HUMALOG 2 units subcutaneous injection for sugars 200-250, 3 units for sugars 251-300; 4 units for sugars greater than 301 What changed:   how much to take  how to take this  when to take this  additional instructions   levETIRAcetam 500 MG tablet Commonly known as: KEPPRA Take 500 mg by mouth 2 (two) times a day.   levothyroxine 200 MCG tablet Commonly known as: SYNTHROID Take 200 mcg by mouth every morning.   losartan 100 MG tablet Commonly known as: COZAAR Take 100 mg by mouth daily.   metoprolol succinate 50 MG 24 hr tablet Commonly known as: TOPROL-XL Take 1 tablet by mouth daily.   multivitamin Tabs tablet Take 1 tablet by mouth at bedtime.   Pancrelipase (Lip-Prot-Amyl) 24000-76000 units Cpep Take 1 capsule by mouth 3 (three) times daily.   sodium bicarbonate 650 MG tablet Take 1 tablet (650 mg total) by mouth 2 (two) times daily.   traZODone 50 MG tablet Commonly known as: DESYREL Take 100 mg by mouth at bedtime.        DISCHARGE INSTRUCTIONS:  Follow-up with team at rehab 1 day  If you experience worsening of your admission symptoms, develop shortness of breath, life threatening emergency, suicidal or homicidal thoughts you must seek medical attention immediately by calling 911 or calling your MD immediately  if symptoms less severe.  You Must read complete instructions/literature along with all the possible adverse reactions/side effects for all the Medicines you take and that have been prescribed to you. Take any new Medicines after you have completely understood and accept all the possible adverse reactions/side effects.   Please note  You were cared for by a hospitalist during your hospital stay. If you have any questions about your discharge medications or the care you received while you were in the hospital  after you are discharged, you can call the unit and asked to speak with the hospitalist on call if the hospitalist that took care of you is not available. Once you are discharged, your primary care physician will handle any further medical issues. Please note that NO REFILLS for any discharge medications will be authorized once you are discharged, as it is imperative that you return to your primary care physician (or establish a relationship with a primary care physician if you do not have one) for your aftercare needs so that they can reassess your need for medications and monitor your lab values.    Today   CHIEF COMPLAINT:   Chief Complaint  Patient presents with  . Dizziness    HISTORY OF PRESENT ILLNESS:  Aryan Sparks  is a 69 y.o. male came in with dizziness   VITAL SIGNS:  Blood pressure 135/62, pulse 70, temperature 98.1 F (36.7 C), temperature source Oral, resp. rate 18, height 6' (  1.829 m), weight (!) 136.5 kg, SpO2 99 %.    PHYSICAL EXAMINATION:  GENERAL:  69 y.o.-year-old patient lying in the bed with no acute distress.  EYES: Pupils equal, round, reactive to light and accommodation.  Left eyelid ptosis HEENT: Head atraumatic, normocephalic. Oropharynx and nasopharynx clear.  NECK:  Supple, no jugular venous distention. No thyroid enlargement, no tenderness.  LUNGS: Normal breath sounds bilaterally, no wheezing, rales,rhonchi or crepitation. No use of accessory muscles of respiration.  CARDIOVASCULAR: S1, S2 normal. No murmurs, rubs, or gallops.  ABDOMEN: Soft, non-tender, non-distended. Bowel sounds present. No organomegaly or mass.  EXTREMITIES: No pedal edema, cyanosis, or clubbing.  NEUROLOGIC: Left eyelid ptosis.  Muscle strength 5/5 in all extremities. Sensation intact. Gait not checked.  PSYCHIATRIC: The patient is alert and oriented x 3.  SKIN: No obvious rash, lesion, or ulcer.   DATA REVIEW:   CBC Recent Labs  Lab 03/20/19 0629  WBC 6.4  HGB 8.1*   HCT 24.0*  PLT 195    Chemistries  Recent Labs  Lab 03/14/19 0623 03/16/19 1443 03/20/19 0629  NA 137   < > 141  K 3.6   < > 3.0*  CL 102   < > 100  CO2 19*   < > 26  GLUCOSE 233*   < > 207*  BUN 59*   < > 44*  CREATININE 9.39*   < > 8.63*  CALCIUM 6.5*   < > 8.6*  MG 1.8  --  1.5*  AST 14*  --   --   ALT 10  --   --   ALKPHOS 92  --   --   BILITOT 0.6  --   --    < > = values in this interval not displayed.     Microbiology Results  Results for orders placed or performed during the hospital encounter of 03/12/19  SARS CORONAVIRUS 2 (TAT 6-24 HRS) Nasopharyngeal Nasopharyngeal Swab     Status: None   Collection Time: 03/12/19  9:31 PM   Specimen: Nasopharyngeal Swab  Result Value Ref Range Status   SARS Coronavirus 2 NEGATIVE NEGATIVE Final    Comment: (NOTE) SARS-CoV-2 target nucleic acids are NOT DETECTED. The SARS-CoV-2 RNA is generally detectable in upper and lower respiratory specimens during the acute phase of infection. Negative results do not preclude SARS-CoV-2 infection, do not rule out co-infections with other pathogens, and should not be used as the sole basis for treatment or other patient management decisions. Negative results must be combined with clinical observations, patient history, and epidemiological information. The expected result is Negative. Fact Sheet for Patients: SugarRoll.be Fact Sheet for Healthcare Providers: https://www.woods-mathews.com/ This test is not yet approved or cleared by the [place] FDA and  has been authorized for detection and/or diagnosis of SARS-CoV-2 by FDA under an Emergency Use Authorization (EUA). This EUA will remain  in effect (meaning this test can be used) for the duration of the COVID-19 declaration under Section 56 4(b)(1) of the Act, 21 U.S.C. section 360bbb-3(b)(1), unless the authorization is terminated or revoked sooner. Performed at [facility], [place] [address] [place],  [address]     RADIOLOGY:  CT CHEST WO CONTRAST  Result Date: 03/18/2019 CLINICAL DATA:  Cough. EXAM: CT CHEST WITHOUT CONTRAST TECHNIQUE: Multidetector CT imaging of the chest was performed following the standard protocol without IV contrast. COMPARISON:  November 05, 2016. FINDINGS: Cardiovascular: Atherosclerosis of thoracic aorta is noted without aneurysm formation. Normal cardiac size.  No pericardial effusion. Coronary artery calcifications are noted. Mediastinum/Nodes: No enlarged mediastinal or axillary lymph nodes. Status post thyroidectomy. The trachea and esophagus demonstrate no significant findings. Lungs/Pleura: Lungs are clear. No pleural effusion or pneumothorax. Upper Abdomen: Cholelithiasis is noted. Musculoskeletal: No chest wall mass or suspicious bone lesions identified. IMPRESSION: Status post thyroidectomy. Cholelithiasis. Coronary artery calcifications are noted. No acute abnormality seen in the chest. Aortic Atherosclerosis (ICD10-I70.0). Electronically Signed   By: Marijo Conception M.D.   On: 03/18/2019 12:19     Management plans discussed with the patient, family and they are in agreement.  CODE STATUS:     Code Status Orders  (From admission, onward)         Start     Ordered   03/12/19 2211  Full code  Continuous     03/12/19 2211        Code Status History    Date Active Date Inactive Code Status Order ID Comments User Context   09/16/2018 2349 09/18/2018 1913 Full Code [id number]  Mayer Camel, NP ED   Advance Care Planning Activity    Advance Directive Documentation     Most Recent Value  Type of Advance Directive  Healthcare Power of Attorney  Pre-existing out of facility DNR order (yellow form or pink MOST form)  --  "MOST" Form in Place?  --      TOTAL TIME TAKING CARE OF THIS PATIENT: 35 minutes.    Loletha Grayer M.D on 03/20/2019 at 9:15 AM  Between 7am to 6pm - Pager - [phone]  After 6pm go to  [email or web address] - password [email or web address]  [facility]  CC: Primary care physician; Idelle Crouch, MD

## 2019-03-20 NOTE — Progress Notes (Signed)
Report given to [place], Therapist, sports at [employer].

## 2019-03-20 NOTE — Consult Note (Signed)
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   He tells me he has been on PD for 15 months.  He has no long-term vascular access.  It appears his PD prescription is CCPD, 2.5 L x 5, dry days, no pause.    No recent abdominal pain, fever, cloudy effluent, constipation, in/outflow problems  Labs from [facility] reviewed, K3.0, phosphorus elevated 9.5, calcium 8.6.  Most recent hemoglobin 8.1.  He has been receiving Epogen.  Patient is currently resting comfortably.  He has no complaints.  PMH Incudes:  DM2 with neuropathy  Seizure disorder  Hypertension  BPH  COPD   Creatinine (mg/dL)  Date Value  08/08/2013 2.04 (H)   Creatinine, Ser (mg/dL)  Date Value  03/20/2019 8.63 (H)  03/16/2019 9.22 (H)  03/14/2019 9.39 (H)  03/13/2019 10.37 (H)  03/12/2019 10.31 (H)  09/18/2018 6.13 (H)  09/17/2018 6.09 (H)  09/16/2018 5.96 (H)  06/28/2017 3.35 (H)  07/27/2016 2.46 (H)  ]  ROS  Balance of 12 systems is negative w/ exceptions as above  PMH  Past Medical History:  Diagnosis Date  . Anxiety   . BPH (benign prostatic hyperplasia)   . Chronic kidney disease   . Colon polyps   . COPD (chronic obstructive pulmonary disease) ([place])   . Depression   . Diabetes mellitus without complication ([place])   . Diverticulosis of colon   . Hyperlipidemia   . Hypertension   . Hypothyroidism   . Nephrolithiasis   . Seizures ([place])    [place]  Past Surgical History:  Procedure Laterality Date  . COLONOSCOPY    . COLONOSCOPY WITH PROPOFOL N/A 05/30/2015   Procedure: COLONOSCOPY WITH PROPOFOL;  Surgeon: Manya Silvas, MD;  Location: [facility] ENDOSCOPY;  Service: Endoscopy;  Laterality: N/A;  . COLONOSCOPY WITH PROPOFOL N/A 12/01/2018    Procedure: COLONOSCOPY WITH PROPOFOL;  Surgeon: Toledo, [name], MD;  Location: [facility] ENDOSCOPY;  Service: Gastroenterology;  Laterality: N/A;  . kidney stone    . THYROIDECTOMY    . VARICOCELE EXCISION    . VIDEO BRONCHOSCOPY Bilateral 05/25/2016   Procedure: VIDEO BRONCHOSCOPY WITHOUT FLUORO;  Surgeon: Juanito Doom, MD;  Location: [facility] ENDOSCOPY;  Service: Cardiopulmonary;  Laterality: Bilateral;   FH  Family History  Problem Relation Age of Onset  . Diabetes Mother   . Diabetes Maternal Grandmother   . Diabetes Maternal Grandfather   . Lung cancer Father   . Emphysema Paternal Grandfather    SH  reports that he has been smoking cigarettes. He has a 40.00 pack-year smoking history. He has never used smokeless tobacco. He reports current alcohol use of about 8.0 standard drinks of alcohol per week. He reports that he does not use drugs. Allergies No Known Allergies Home medications Prior to Admission medications   Medication Sig Start Date End Date Taking? Authorizing Provider  amLODipine (NORVASC) 2.5 MG tablet Take 1 tablet (2.5 mg total) by mouth daily. 03/20/19   Loletha Grayer, MD  aspirin EC 81 MG EC tablet Take 1 tablet (81 mg total) by mouth daily. 03/20/19   Loletha Grayer, MD  atorvastatin (LIPITOR) 80 MG tablet Take 1 tablet (80 mg total) by mouth daily. 03/20/19 04/19/19  Loletha Grayer, MD  calcitRIOL (ROCALTROL) 0.5  MCG capsule Take 1 capsule (0.5 mcg total) by mouth daily. 03/21/19   Loletha Grayer, MD  calcium acetate (PHOSLO) 667 MG capsule Take 2 capsules (1,334 mg total) by mouth 3 (three) times daily with meals. 03/20/19   Loletha Grayer, MD  clopidogrel (PLAVIX) 75 MG tablet Take 1 tablet (75 mg total) by mouth daily for 18 days. 03/20/19 04/07/19  Loletha Grayer, MD  gabapentin (NEURONTIN) 100 MG capsule TAKE 1 CAPSULE BY MOUTH TWICE A DAY FOR ONE WEEK, THEN INCREASE TO 2 CAPSULES TWICE A DAY AND CONTINUE AS DIRECTED 03/05/19   [provider]   gentamicin cream (GARAMYCIN) 0.1 % Apply 1 application topically daily. 03/20/19   Loletha Grayer, MD  insulin glargine (LANTUS) 100 unit/mL SOPN Inject 0.05 mLs (5 Units total) into the skin at bedtime. 03/20/19   Loletha Grayer, MD  insulin lispro (HUMALOG) 100 UNIT/ML KwikPen 2 units subcutaneous injection for sugars 200-250, 3 units for sugars 251-300; 4 units for sugars greater than 301 03/20/19   Wieting, Richard, MD  levETIRAcetam (KEPPRA) 500 MG tablet Take 500 mg by mouth 2 (two) times a day. 09/05/15   [provider]  levothyroxine (SYNTHROID) 200 MCG tablet Take 200 mcg by mouth every morning. 02/25/19   [provider]  losartan (COZAAR) 100 MG tablet Take 100 mg by mouth daily. 09/12/18   [provider]  metoprolol succinate (TOPROL-XL) 50 MG 24 hr tablet Take 1 tablet by mouth daily. 03/15/15 03/12/19  [provider]  multivitamin (RENA-VIT) TABS tablet Take 1 tablet by mouth at bedtime. 03/20/19   Loletha Grayer, MD  Nutritional Supplements (FEEDING SUPPLEMENT, NEPRO CARB STEADY,) LIQD Take 237 mLs by mouth 2 (two) times daily between meals. 03/20/19   Loletha Grayer, MD  Pancrelipase, Lip-Prot-Amyl, 24000-76000 units CPEP Take 1 capsule by mouth 3 (three) times daily.    [provider]  sodium bicarbonate 650 MG tablet Take 1 tablet (650 mg total) by mouth 2 (two) times daily. 03/20/19   Loletha Grayer, MD  traZODone (DESYREL) 50 MG tablet Take 100 mg by mouth at bedtime.    [provider]    Current Medications Scheduled Meds: . gentamicin cream  1 application Topical Daily   Continuous Infusions: . dialysis solution 1.5% low-MG/low-CA     PRN Meds:.heparin  CBC Recent Labs  Lab 03/14/19 0623 03/20/19 0629  WBC 5.6 6.4  HGB 8.3* 8.1*  HCT 24.9* 24.0*  MCV 96.5 98.8  PLT 178 093   Basic Metabolic Panel Recent Labs  Lab 03/14/19 0623 03/16/19 1443 03/20/19 0629  NA 137 141 141  K 3.6 3.5 3.0*  CL 102  102 100  CO2 19* 24 26  GLUCOSE 233* 73 207*  BUN 59* 50* 44*  CREATININE 9.39* 9.22* 8.63*  CALCIUM 6.5* 8.1* 8.6*  PHOS 9.0* 9.5*  --     Physical Exam  There were no vitals taken for this visit. GEN: Chronically ill, NAD ENT: Left eye ptosis EYES: EOMI CV: Regular, normal S1 and S2 PULM: Clear bilaterally, normal work of breathing, ABD: Soft, nontender.  Bowel sounds present.  PD catheter in left upper quadrant, bandaged, deferred on evaluation of exit site SKIN: No rashes or lesions EXT: No significant edema   Assessment 26M ESRD on PD s/p acute ischemic CVA 1/14 trnsferred to [facility] CIR 1/22  1. ESRD on PD, with CCKA; CCPD 2.5L x5, dry day no pause 2. Acute ischemic L CVA 3. Anemia, not at goal 4. CKD-BMD, on C3,  PhosLo; Hyperphosphatemia 5. DM2, neuropathy 6. COPD  Plan 1. Cont CCPD tonight, start all 1.5% destrose, monitor vol  Status 2. COnt binders, C3 3. Check Fe levels   Rexene Agent  [phone] pgr 03/20/2019, 3:37 PM

## 2019-03-20 NOTE — Progress Notes (Signed)
Patients brother called from [place] and is very upset that he has not been contacted regarding his brother. He would like to hear from Social worker regarding brothers status ASAP. Phone number is in chart. Brother is Ester Rink. Doy Hutching, LPN

## 2019-03-21 ENCOUNTER — Inpatient Hospital Stay (HOSPITAL_COMMUNITY): Payer: Medicare PPO

## 2019-03-21 ENCOUNTER — Inpatient Hospital Stay (HOSPITAL_COMMUNITY): Payer: Medicare PPO | Admitting: Occupational Therapy

## 2019-03-21 ENCOUNTER — Inpatient Hospital Stay (HOSPITAL_COMMUNITY): Payer: Medicare PPO | Admitting: Physical Therapy

## 2019-03-21 DIAGNOSIS — I639 Cerebral infarction, unspecified: Secondary | ICD-10-CM

## 2019-03-21 DIAGNOSIS — N186 End stage renal disease: Secondary | ICD-10-CM

## 2019-03-21 DIAGNOSIS — E1165 Type 2 diabetes mellitus with hyperglycemia: Secondary | ICD-10-CM

## 2019-03-21 DIAGNOSIS — H4902 Third [oculomotor] nerve palsy, left eye: Secondary | ICD-10-CM

## 2019-03-21 DIAGNOSIS — Z992 Dependence on renal dialysis: Secondary | ICD-10-CM

## 2019-03-21 LAB — COMPREHENSIVE METABOLIC PANEL
ALT: 11 U/L (ref 0–44)
AST: 16 U/L (ref 15–41)
Albumin: 2 g/dL — ABNORMAL LOW (ref 3.5–5.0)
Alkaline Phosphatase: 79 U/L (ref 38–126)
Anion gap: 15 (ref 5–15)
BUN: 39 mg/dL — ABNORMAL HIGH (ref 8–23)
CO2: 28 mmol/L (ref 22–32)
Calcium: 8.9 mg/dL (ref 8.9–10.3)
Chloride: 99 mmol/L (ref 98–111)
Creatinine, Ser: 8.75 mg/dL — ABNORMAL HIGH (ref 0.61–1.24)
GFR calc Af Amer: 6 mL/min — ABNORMAL LOW (ref >60)
GFR calc non Af Amer: 6 mL/min — ABNORMAL LOW (ref >60)
Glucose, Bld: 184 mg/dL — ABNORMAL HIGH (ref 70–99)
Potassium: 3.3 mmol/L — ABNORMAL LOW (ref 3.5–5.1)
Sodium: 142 mmol/L (ref 135–145)
Total Bilirubin: 0.7 mg/dL (ref 0.3–1.2)
Total Protein: 5.1 g/dL — ABNORMAL LOW (ref 6.5–8.1)

## 2019-03-21 LAB — IRON AND TIBC
Iron: 39 ug/dL — ABNORMAL LOW (ref 45–182)
Saturation Ratios: 20 % (ref 17.9–39.5)
TIBC: 192 ug/dL — ABNORMAL LOW (ref 250–450)
UIBC: 153 ug/dL

## 2019-03-21 LAB — GLUCOSE, CAPILLARY
Glucose-Capillary: 251 mg/dL — ABNORMAL HIGH (ref 70–99)
Glucose-Capillary: 156 mg/dL — ABNORMAL HIGH (ref 70–99)
Glucose-Capillary: 223 mg/dL — ABNORMAL HIGH (ref 70–99)
Glucose-Capillary: 268 mg/dL — ABNORMAL HIGH (ref 70–99)

## 2019-03-21 LAB — FERRITIN: Ferritin: 321 ng/mL (ref 24–336)

## 2019-03-21 LAB — HEPATITIS B SURFACE ANTIGEN: Hepatitis B Surface Ag: NONREACTIVE

## 2019-03-21 LAB — MAGNESIUM: Magnesium: 1.8 mg/dL (ref 1.7–2.4)

## 2019-03-21 MED ORDER — INSULIN GLARGINE 100 UNIT/ML ~~LOC~~ SOLN
20.0000 [IU] | Freq: Every day | SUBCUTANEOUS | Status: DC
  Administered 2019-03-21 – 2019-03-23 (×3): 20 [IU] via SUBCUTANEOUS
  Filled 2019-03-21 (×4): qty 0.2

## 2019-03-21 MED ORDER — RESOURCE THICKENUP CLEAR PO POWD
ORAL | Status: DC | PRN
  Filled 2019-03-21: qty 125

## 2019-03-21 NOTE — Evaluation (Signed)
Speech Language Pathology Assessment and Plan  Patient Details  Name: Kyle [name] MRN: [id number] Date of Birth: 08-24-1950  SLP Diagnosis: Dysarthria;Dysphagia  Rehab Potential: Good ELOS: 10-14 days    Today's Date: 03/21/2019 SLP Individual Time: 4076-8088 SLP Individual Time Calculation (min): 58 min   Problem List:  Patient Active Problem List   Diagnosis Date Noted  . 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[place]) 03/20/2019  . Generalized weakness   . Diabetic polyneuropathy associated with type 2 diabetes mellitus ([place])   . Cerebrovascular accident (CVA) ([place])   . PVD (peripheral vascular disease) ([place]) 03/17/2019  . Ptosis of eyelid, left 03/15/2019  . Physical deconditioning 03/14/2019  . Hypomagnesemia 03/13/2019  . Hyperphosphatemia 03/13/2019  . ESRD (end stage renal disease) ([place]) 03/13/2019  . Chronic kidney disease with peritoneal dialysis as preferred modality, stage 5 ([place]) 03/13/2019  . Prolonged QT interval 03/13/2019  . Essential hypertension 03/13/2019  . Hypothyroidism 03/13/2019  . Chronic diarrhea 03/13/2019  . Hypocalcemia 03/12/2019  . Hypokalemia 09/16/2018  . Lung nodule 06/12/2016  . Narrowing of airway   . Cigarette smoker 05/24/2016  . Collapse of right lung 05/24/2016  . COPD with chronic bronchitis ([place]) 05/24/2016  . Acne 05/20/2015  . Calculus of kidney 05/20/2015  . Chest pain, non-cardiac 05/20/2015  . Seizure ([place]) 05/20/2015  . Current tobacco use 05/20/2015  . Chronic kidney disease (CKD), stage III (moderate) 03/29/2015  . Type 2 diabetes mellitus with hyperlipidemia ([place]) 03/15/2015  . Lipoma of shoulder 03/08/2014  . Other synovitis and tenosynovitis, right shoulder 03/08/2014  . Bursitis of elbow 02/15/2014  . Absolute anemia 08/08/2013  . 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 Back pain, chronic 08/08/2013  . Chronic obstructive pulmonary disease ([place]) 08/08/2013  . Diabetes mellitus ([place]) 08/08/2013  . BP (high blood pressure)  08/08/2013  . HLD (hyperlipidemia) 08/08/2013  . Acne erythematosa 08/08/2013   Past Medical History:  Past Medical History:  Diagnosis Date  . Anxiety   . BPH (benign prostatic hyperplasia)   . Chronic kidney disease   . Colon polyps   . COPD (chronic obstructive pulmonary disease) ([place])   . Depression   . Diabetes mellitus without complication ([place])   . Diverticulosis of colon   . Hyperlipidemia   . Hypertension   . Hypothyroidism   . Nephrolithiasis   . Seizures ([place])    Past Surgical History:  Past Surgical History:  Procedure Laterality Date  . COLONOSCOPY    . COLONOSCOPY WITH PROPOFOL N/A 05/30/2015   Procedure: COLONOSCOPY WITH PROPOFOL;  Surgeon: Manya Silvas, MD;  Location: [facility] ENDOSCOPY;  Service: Endoscopy;  Laterality: N/A;  . COLONOSCOPY WITH PROPOFOL N/A 12/01/2018   Procedure: COLONOSCOPY WITH PROPOFOL;  Surgeon: Toledo, [name], MD;  Location: [facility] ENDOSCOPY;  Service: Gastroenterology;  Laterality: N/A;  . kidney stone    . THYROIDECTOMY    . VARICOCELE EXCISION    . VIDEO BRONCHOSCOPY Bilateral 05/25/2016   Procedure: VIDEO BRONCHOSCOPY WITHOUT FLUORO;  Surgeon: Juanito Doom, MD;  Location: [facility] ENDOSCOPY;  Service: Cardiopulmonary;  Laterality: Bilateral;    Assessment / Plan / Recommendation Clinical Impression Patient is a 69 year old male with history of T2DM with neuropathy and nephropathy, ESRD-on PD, seizure d/o, COPD, morbid obesity, gait disorder with falls, ongoing work up for chronic diarrhea who was admitted to [facility] on [facility]/12/19 with significant hypocalcemia, confusion and weakness. Wife reported having missed dialysis for 3 days? And patient with elevated phosphorous and anion gap. He was started  on IV calcium and phosphate binders--did have a fall in ED while trying to get to the bathroom independently. CT head negative for acute changes. He developed left ptosis, decreased in ability to use RUE and PT evaluation revealed balance deficits  with significant left lean on standing with poor safety awareness. 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 2D echo showed EF 65-70% with mild increase in left ventricular wall and grade 1 DD. Carotid dopplers was negative for significant ICA stenosis. Dr. Doy Mince felt that stroke was due to small vessel disease and recommended DAPT x 3 weeks followed by ASA alone. He continues to have bouts of confusion with hallucinations as well as electrolyte abnormalities. ST recommended downgrading diet to dysphagia 2, nectarsdue to mild to moderate oropharyngeal dysphagia.He continues to be limited decreased balance with ataxia, decreased awareness of deficits and impulsivity  Patient transferred to CIR on 03/20/2019 .   Pt presents with moderate cognitive impairment, deficits include basic problem solving, and short term recall, secondary to intellectual awareness and fluctuating reduced attention/alertness. Pt was extremely lethargic in the beginning of sessions with 30 second intervals of sustained attention, however alertness significantly improved as the session continued. Pt is unable to open left eye completely and expressed vision changes due to cataracts. Pt was oriented to person, city, situation, month and year. Pt demonstrated awareness of vague cognitive/physical changes, however not able to identify specifics nor insight into the impact of deficits. Pt demonstrated short term recall of 3 out 5 words from [facility] basic, and was able to recall swallow strategies with cues. Pt demonstrated ability to problem solve with call bell, but unable to complete basic verbal problem solving from [facility] basic. Pt presents with mild dysarthria characterized by imprecise articulation at the sentence level, further impacted by reduced attention.   Pt presents with mild dysphagia, consuming thin via cup/straw, nectar thick liquids via cup, dys  3 and regular textures trials. Pt demonstrated x1 delayed cough on dys 3 textures, when not utilizing second/dry swallow strategy. Pt demonstrated appropriate mastication and oral clearance (preforming finger sweeps mod I) with trialed solid textures. Pt's swallow appeared timely and no overt s/s aspirations noted on thin and nectar thick liquid trials. MBS 1/19 completed at [facility] indicated need for dry swallow following solids/liquids due to pharyngeal residue, recommending dys 2 and nectar thick liquid diet. Writer reviewed x-ray images, in which penetration was cleared on thin/nectar thick liquids (images were not labeled) upon completion of swallow and no aspiration observed on all trial textures. SLP recommended dys 3 textures and thin liquid diet, with full supervision for use of dry swallow with solids/liquids, limited distractions and only consuming intake when fully alert. SLP provided education to nurse and NT for strict use of second swallow strategy and to monitor thin liquid upgrade. Pt would benefit from skilled ST services in order to maximize functional independence and reduce burden of care, likely requiring 24 hour supervision and continue ST services.   Skilled Therapeutic Interventions          Skilled ST services focused on cognitive skills. SLP administered informal cognitive linguistic assessment, educated pt on results and created plan to address deficits. All questions were answered to satisfaction. Pt was left in room with call bell within reach and bed alarm set. ST recommends to continue skilled ST services.   SLP Assessment  Patient will need skilled Speech Lanaguage Pathology Services during CIR admission  Recommendations  SLP Diet Recommendations: Thin;Dysphagia 3 (Mech soft) Liquid Administration via: Straw;Cup Medication Administration: Whole meds with puree Supervision: Full supervision/cueing for compensatory strategies Compensations: Minimize environmental  distractions;Slow rate;Small sips/bites;Lingual sweep for clearance of pocketing;Multiple dry swallows after each bite/sip;Follow solids with liquid Postural Changes and/or Swallow Maneuvers: Seated upright 90 degrees Oral Care Recommendations: Oral care BID Patient destination: Home Follow up Recommendations: Home Health SLP;24 hour supervision/assistance Equipment Recommended: None recommended by SLP    SLP Frequency 3 to 5 out of 7 days   SLP Duration  SLP Intensity  SLP Treatment/Interventions 10-14 days  Minumum of 1-2 x/day, 30 to 90 minutes  Cognitive remediation/compensation;Cueing hierarchy;Dysphagia/aspiration precaution training;Functional tasks;Internal/external aids;Patient/family education    Pain Pain Assessment Pain Score: 0-No pain  Prior Functioning Cognitive/Linguistic Baseline: Information not available Type of Home: House  Lives With: Spouse Available Help at Discharge: Family;Available 24 hours/day Vocation: Retired(inconsistent told SLP he was working and PT he was retried)  SLP Evaluation Cognition Overall Cognitive Status: Impaired/Different from baseline Arousal/Alertness: Awake/alert Orientation Level: Oriented to situation;Oriented to person;Disoriented to place;Disoriented to time Attention: Sustained Sustained Attention: Impaired Sustained Attention Impairment: Functional basic;Verbal basic Memory: Impaired(recalled 3 out 5 words) Memory Impairment: Decreased recall of new information Immediate Memory Recall: Sock;Blue;Bed Memory Recall Sock: Without Cue Memory Recall Blue: Without Cue Memory Recall Bed: Without Cue Awareness: Appears intact Awareness Impairment: Intellectual impairment Problem Solving: Impaired Problem Solving Impairment: Functional basic;Verbal basic Behaviors: Impulsive Safety/Judgment: Impaired  Comprehension Auditory Comprehension Overall Auditory Comprehension: Appears within functional limits for tasks  assessed Expression Expression Primary Mode of Expression: Verbal Verbal Expression Overall Verbal Expression: Appears within functional limits for tasks assessed Oral Motor Oral Motor/Sensory Function Overall Oral Motor/Sensory Function: Mild impairment Facial ROM: Within Functional Limits Lingual Symmetry: Within Functional Limits Lingual Strength: Reduced Mandible: Within Functional Limits Motor Speech Overall Motor Speech: Impaired Respiration: Within functional limits Phonation: Normal Resonance: Within functional limits Articulation: Impaired Level of Impairment: Sentence Intelligibility: Intelligibility reduced Word: 75-100% accurate Phrase: 75-100% accurate Sentence: 75-100% accurate Conversation: 75-100% accurate Motor Planning: Witnin functional limits Motor Speech Errors: Not applicable Effective Techniques: Slow rate;Over-articulate(must be alert)   PMSV Assessment  PMSV Trial Intelligibility: Intelligibility reduced Word: 75-100% accurate Phrase: 75-100% accurate Sentence: 75-100% accurate Conversation: 75-100% accurate  Bedside Swallowing Assessment General Date of Onset: 03/12/19 Previous Swallow Assessment: 1/19 MBS dys 2 and nectar thick liquids Diet Prior to this Study: Dysphagia 2 (chopped);Nectar-thick liquids Respiratory Status: Room air History of Recent Intubation: No Behavior/Cognition: Lethargic/Drowsy;Cooperative;Pleasant mood Oral Cavity - Dentition: Adequate natural dentition Self-Feeding Abilities: Able to feed self Patient Positioning: Upright in bed Baseline Vocal Quality: Normal Volitional Cough: Strong Volitional Swallow: Able to elicit  Oral Care Assessment Does patient have any of the following "high(er) risk" factors?: None of the above Does patient have any of the following "at risk" factors?: Other - dysphagia Patient is AT RISK: Order set for Adult Oral Care Protocol initiated -  "At Risk Patients" option selected (see row  information) Ice Chips Ice chips: Within functional limits Thin Liquid Thin Liquid: Within functional limits Presentation: Cup;Self Fed;Straw Nectar Thick Nectar Thick Liquid: Within functional limits Presentation: Cup;Self Fed Honey Thick Honey Thick Liquid: Not tested Puree Puree: Not tested Solid Solid: Impaired Presentation: Self Fed Pharyngeal Phase Impairments: Cough - Delayed Other Comments: x1 delayed cough when consuming cracker dys 3 BSE Assessment Risk for Aspiration Impact on safety and function: Mild aspiration risk;Moderate aspiration risk Other Related Risk Factors: Cognitive impairment;Deconditioning;Lethargy;Previous CVA  Short Term Goals: Week  1: SLP Short Term Goal 1 (Week 1): Pt wil consume dys 3 textures and thin liquids with minimal overt s/s aspiration and min A verbal cues for use of swallow startegies ( second swallow.) SLP Short Term Goal 2 (Week 1): Pt will demonstrate use of speech intelligibility strategies (over articulation and slow rate) at sentence level for 90% intelligibility with min A verbal cues. SLP Short Term Goal 3 (Week 1): Pt will demonstrate sustained attention in 15 minute intervals with min A verbal cues for redirection during functional tasks. SLP Short Term Goal 4 (Week 1): Pt will demonstrate intellectual awareness of 2 cognitive/swallow/speech deficits and 2 physical deficits with mod A verbal cues. SLP Short Term Goal 5 (Week 1): Pt will complete functional and basic problem solving tasks with min A verbal cues. SLP Short Term Goal 6 (Week 1): Pt will demonstrate recall of novel and daily information with min A verbal cues for compensatory startegies.  Refer to Care Plan for Long Term Goals  Recommendations for other services: None   Discharge Criteria: Patient will be discharged from SLP if patient refuses treatment 3 consecutive times without medical reason, if treatment goals not met, if there is a change in medical status, if  patient makes no progress towards goals or if patient is discharged from hospital.  The above assessment, treatment plan, treatment alternatives and goals were discussed and mutually agreed upon: by patient  Mumtaz Lovins  [facility] 03/21/2019, 4:33 PM

## 2019-03-21 NOTE — H&P (Signed)
Physical Medicine and Rehabilitation Admission H&P        Chief Complaint  Patient presents with  . 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 Wife reported having missed dialysis for 3 days? And patient with elevated phosphorous and anion gap. He was started on IV calcium and phosphate binders--did have a fall in ED while trying to get to the bathroom independently. CT head negative for acute changes.  He developed left ptosis, decreased in ability to use RUE and PT evaluation revealed balance deficits with significant left lean on standing with poor safety awareness.  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  2D echo showed EF 65-70% with mild increase in left ventricular wall and grade 1 DD. Carotid dopplers was negative for significant ICA stenosis. Dr. Doy Mince felt that stroke was due to small vessel disease and recommended DAPT x 3 weeks followed by ASA alone.  He continues to have bouts of confusion with hallucinations as well as electrolyte abnormalities. ST recommended downgrading diet to dysphagia 2, nectars due to mild to moderate oropharyngeal dysphagia.  He continues to be limited decreased balance with ataxia, decreased awareness of deficits and impulsivity .  CIR recommended due to functional deficits due to weakness and stroke.       Review of Systems  Eyes: Positive for blurred vision (decreased vision left eye due to cataract ).  Musculoskeletal: Positive for falls (once a week ).  Neurological: Positive for dizziness and weakness.            Past Medical History:  Diagnosis Date  . Anxiety     . BPH (benign prostatic hyperplasia)    . Chronic kidney disease    . COPD (chronic obstructive pulmonary disease) ([place])    . Depression    . Diabetes mellitus without complication ([place])    . Hyperlipidemia    . Hypertension    . Hypothyroidism    . Nephrolithiasis    . Seizures ([place])             Past Surgical History:  Procedure Laterality Date  . COLONOSCOPY      . COLONOSCOPY WITH PROPOFOL N/A 05/30/2015    Procedure: COLONOSCOPY WITH PROPOFOL;  Surgeon: Manya Silvas, MD;  Location: [facility] ENDOSCOPY;  Service: Endoscopy;  Laterality: N/A;  . COLONOSCOPY WITH PROPOFOL N/A 12/01/2018    Procedure: COLONOSCOPY WITH PROPOFOL;  Surgeon: Toledo, [name], MD;  Location: [facility] ENDOSCOPY;  Service: Gastroenterology;  Laterality: N/A;  . kidney stone      . THYROIDECTOMY      . VARICOCELE EXCISION      . VIDEO BRONCHOSCOPY Bilateral 05/25/2016    Procedure: VIDEO BRONCHOSCOPY WITHOUT FLUORO;  Surgeon: Juanito Doom, MD;  Location: [facility] ENDOSCOPY;  Service: Cardiopulmonary;  Laterality: Bilateral;           Family History  Problem Relation Age of Onset  . Diabetes Mother    . Diabetes Maternal Grandmother    . Diabetes Maternal Grandfather    . Lung cancer Father    . Emphysema Paternal Grandfather  Social History:  Married. Sedentary--ambulates short distances at home but stays in bed most of the day. He  reports that he has been smoking cigarettes. He has a 40.00 pack-year smoking history. He has never used smokeless tobacco. He reports current alcohol use of about 8.0 standard drinks of alcohol per week. He reports that he does not use drugs.    Allergies: No Known Allergies            Medications Prior to Admission  Medication Sig Dispense Refill  . atorvastatin (LIPITOR) 10 MG tablet Take 10 mg by mouth daily.      . furosemide (LASIX) 80 MG tablet Take 80 mg by mouth daily.      [name] gabapentin (NEURONTIN) 100 MG capsule TAKE 1 CAPSULE BY MOUTH TWICE A DAY FOR ONE  WEEK, THEN INCREASE TO 2 CAPSULES TWICE A DAY AND CONTINUE AS DIRECTED      . glipiZIDE (GLUCOTROL XL) 10 MG 24 hr tablet Take 10 mg by mouth daily.      [name] HYDROcodone-acetaminophen (NORCO/VICODIN) 5-325 MG tablet Take 1 tablet by mouth every 4 (four) hours as needed for moderate pain.      [name] insulin glargine (LANTUS) 100 unit/mL SOPN Inject 20 Units into the skin daily.      . insulin lispro (HUMALOG) 100 UNIT/ML KwikPen Inject 4-14 Units into the skin daily. Sliding scale      . levETIRAcetam (KEPPRA) 500 MG tablet Take 500 mg by mouth 2 (two) times a day.      . levothyroxine (SYNTHROID) 200 MCG tablet Take 200 mcg by mouth every morning.      [name] losartan (COZAAR) 100 MG tablet Take 100 mg by mouth daily.      . metoprolol succinate (TOPROL-XL) 50 MG 24 hr tablet Take 1 tablet by mouth daily.      . Pancrelipase, Lip-Prot-Amyl, 24000-76000 units CPEP Take 1 capsule by mouth 3 (three) times daily.      . Semaglutide,0.25 or 0.5MG /DOS, 2 MG/1.5ML SOPN Inject 0.5 mg into the skin once a week.      . traZODone (DESYREL) 50 MG tablet Take 100 mg by mouth at bedtime.      . Amylase-Lipase-Protease (CREON 10 PO) Take by mouth daily as needed.      . calcium carbonate (TUMS) 500 MG chewable tablet Chew 1 tablet (200 mg of elemental calcium total) by mouth 3 (three) times daily with meals. (Patient not taking: Reported on 03/12/2019) 120 tablet 3  . escitalopram (LEXAPRO) 10 MG tablet Take 10 mg by mouth daily.      . potassium chloride SA (K-DUR) 20 MEQ tablet Take 1 tablet (20 mEq total) by mouth 2 (two) times a day. (Patient not taking: Reported on 03/12/2019) 90 tablet 0      Drug Regimen Review  Drug regimen was reviewed and remains appropriate with no significant issues identified   Home: Home Living Family/patient expects to be discharged to:: Private residence Living Arrangements: Spouse/significant other Available Help at Discharge: Family, Available 24 hours/day(spouse; no children) Type  of Home: House Home Access: Stairs to enter [employer] of Steps: 6 Entrance Stairs-Rails: Can reach both Home Layout: One level Bathroom Shower/Tub: Multimedia programmer: Handicapped height Bathroom Accessibility: Yes Home Equipment: Environmental consultant - 2 wheels, [place] - single point, Environmental consultant - standard  Lives With: Spouse   Functional History: Prior Function Level of Independence: Needs assistance Gait / Transfers Assistance Needed: Pt reports that he does not use walkers,  states he typically uses [facility] for fxl mobility for household distances. ADL's / Homemaking Assistance Needed: Pt states he was able to perfrom BADLs, states that he was driving. States that wife primarily performs cooking/cleaning.   Functional Status:  Mobility: Bed Mobility Overal bed mobility: Needs Assistance Bed Mobility: Supine to Sit Supine to sit: Min assist Sit to supine: Supervision General bed mobility comments: Deferred. Pt up in recliner at start/end of sesison. Transfers Overall transfer level: Needs assistance Equipment used: Rolling walker (2 wheeled) Transfers: Sit to/from Stand Sit to Stand: +2 physical assistance, Min assist, +2 safety/equipment General transfer comment: Min A for stability upon initial stand Ambulation/Gait Ambulation/Gait assistance: Mod assist, +2 physical assistance Gait Distance (Feet): 30 Feet Assistive device: Rolling walker (2 wheeled) Gait Pattern/deviations: Decreased step length - right, Decreased step length - left, Step-through pattern, Wide base of support, Drifts right/left, Ataxic General Gait Details: Pt impulsive with amb with Mod A for stability and max verbal cues for amb closer to RW with upright posture Gait velocity: decreased   ADL: ADL Overall ADL's : Needs assistance/impaired Eating/Feeding: Sitting, Minimal assistance Eating/Feeding Details (indicate cue type and reason): Pt on modified diet per SLP. Requires nectar thick liquids, but  repeatedly requests "regular water" during session. This author declines. Pt continues to have decreased safety awareness and awareness of deficits. Would benefit from supervision for safety during self-feeding. Grooming: Set up, Sitting, Wash/dry face, Oral care, Minimal assistance, Brushing hair, Moderate assistance, Cueing for safety, Cueing for sequencing, Cueing for compensatory techniques Grooming Details (indicate cue type and reason): Pt performs seated grooming tasks with varying level of assist this date. He continues to have difficulty with dynamic sitting balance and requires moderate assist for tasks that require weight shift/changes in BOS including hair brushing with both arms overhead. He is able to complete oral care given set-up to minimal assist this date. He has poor [place] in BUE with R>L this date. Pt educated on compensatory strategies for holding toothbrush, applying toothpaste, and oral care mgt. No signs/symptoms of aspiration during oral care. Upper Body Bathing: Sitting, Moderate assistance, Cueing for compensatory techniques, Cueing for sequencing, Cueing for safety Lower Body Bathing: Moderate assistance, +2 for safety/equipment, Sitting/lateral leans, Cueing for compensatory techniques, Cueing for sequencing, Cueing for safety Upper Body Dressing : Minimal assistance, Sitting, Cueing for safety, Cueing for sequencing, Cueing for compensatory techniques Lower Body Dressing: Moderate assistance, Sit to/from stand, +2 for physical assistance Lower Body Dressing Details (indicate cue type and reason): Pt continues to have poor dynamic sitting balance. Requires moderate assist for any sit to/from stand tasks including donning lower body clothing. Toilet Transfer: +2 for physical assistance, Stand-pivot, BSC, RW, +2 for safety/equipment Toilet Transfer Details (indicate cue type and reason): Pt notably ataxic, requiring +2 assist for functional mobility this date. Requires moderate  assist +2 to maintain standing balance this date. Toileting- Clothing Manipulation and Hygiene: Moderate assistance, +2 for physical assistance, +2 for safety/equipment, Sit to/from stand Tub/ Shower Transfer: Stand-pivot, +2 for physical assistance, +2 for safety/equipment, Moderate assistance Functional mobility during ADLs: +2 for physical assistance, +2 for safety/equipment, Moderate assistance, Rolling walker General ADL Comments: Pt continues to be functionally limited by ataxic movements, poor balance, and limited safety awareness/awareness of deficits. Continues to require +2 mod A for functional mobility or STS tasks such as LB dressing. Min A to supervision for UB tasks that can be completed in sitting such as dressing and grooming.   Cognition: Cognition Overall Cognitive Status: Impaired/Different from  baseline Orientation Level: Oriented to person, Oriented to place, Disoriented to time, Oriented to situation Cognition Arousal/Alertness: Awake/alert Behavior During Therapy: Impulsive Overall Cognitive Status: Impaired/Different from baseline Area of Impairment: Safety/judgement, Problem solving Safety/Judgement: Decreased awareness of safety, Decreased awareness of deficits Problem Solving: Slow processing, Difficulty sequencing, Requires verbal cues General Comments: Poor insight into functional deficits.   Physical Exam: Blood pressure 135/62, pulse 70, temperature 98.1 F (36.7 C), temperature source Oral, resp. rate 18, height 6' (1.829 m), weight (!) 136.5 kg, SpO2 99 %. Physical Exam  Constitutional: No distress . Vital signs reviewed. HEENT:   oral membranes moist Neck: supple Cardiovascular: RRR without murmur. No JVD    Respiratory: CTA Bilaterally without wheezes or rales. Normal effort    GI: BS +, non-tender, non-distended  Musculoskeletal:        General: Normal range of motion.     Cervical back: Normal range of motion.  Neurological: alert, decreased  insight and awareness. Speech dysarthric. Left CN III palsy with ptosis and decreased medial deviation of left eye. Poor depth perception Mild right HP. No focal sensory deficits.  Skin: Skin is warm.  Psychiatric: a little impulsive. Generally cooperative     Lab Results Last 48 Hours        Results for orders placed or performed during the hospital encounter of 03/12/19 (from the past 48 hour(s))  Glucose, capillary     Status: Abnormal    Collection Time: 03/18/19  1:15 PM  Result Value Ref Range    Glucose-Capillary 233 (H) 70 - 99 mg/dL  Glucose, capillary     Status: Abnormal    Collection Time: 03/18/19  4:25 PM  Result Value Ref Range    Glucose-Capillary 122 (H) 70 - 99 mg/dL  Glucose, capillary     Status: Abnormal    Collection Time: 03/18/19  8:45 PM  Result Value Ref Range    Glucose-Capillary 109 (H) 70 - 99 mg/dL  Glucose, capillary     Status: Abnormal    Collection Time: 03/19/19  7:52 AM  Result Value Ref Range    Glucose-Capillary 140 (H) 70 - 99 mg/dL  Glucose, capillary     Status: Abnormal    Collection Time: 03/19/19 11:34 AM  Result Value Ref Range    Glucose-Capillary 262 (H) 70 - 99 mg/dL  Glucose, capillary     Status: Abnormal    Collection Time: 03/19/19  4:40 PM  Result Value Ref Range    Glucose-Capillary 122 (H) 70 - 99 mg/dL  Glucose, capillary     Status: Abnormal    Collection Time: 03/19/19  8:47 PM  Result Value Ref Range    Glucose-Capillary 217 (H) 70 - 99 mg/dL  CBC     Status: Abnormal    Collection Time: 03/20/19  6:29 AM  Result Value Ref Range    WBC 6.4 4.0 - 10.5 K/uL    RBC 2.43 (L) 4.22 - 5.81 MIL/uL    Hemoglobin 8.1 (L) 13.0 - 17.0 g/dL    HCT 24.0 (L) 39.0 - 52.0 %    MCV 98.8 80.0 - 100.0 fL    MCH 33.3 26.0 - 34.0 pg    MCHC 33.8 30.0 - 36.0 g/dL    RDW 15.8 (H) 11.5 - 15.5 %    Platelets 195 150 - 400 K/uL    nRBC 0.0 0.0 - 0.2 %      Comment: Performed at [facility], [address], [place],  [place]  [address]  Basic metabolic panel     Status: Abnormal    Collection Time: 03/20/19  6:29 AM  Result Value Ref Range    Sodium 141 135 - 145 mmol/L    Potassium 3.0 (L) 3.5 - 5.1 mmol/L    Chloride 100 98 - 111 mmol/L    CO2 26 22 - 32 mmol/L    Glucose, Bld 207 (H) 70 - 99 mg/dL    BUN 44 (H) 8 - 23 mg/dL    Creatinine, Ser 8.63 (H) 0.61 - 1.24 mg/dL    Calcium 8.6 (L) 8.9 - 10.3 mg/dL    GFR calc non Af Amer 6 (L) >60 mL/min    GFR calc Af Amer 6 (L) >60 mL/min    Anion gap 15 5 - 15      Comment: Performed at [facility], [address] [place], [place] [address]  Magnesium     Status: Abnormal    Collection Time: 03/20/19  6:29 AM  Result Value Ref Range    Magnesium 1.5 (L) 1.7 - 2.4 mg/dL      Comment: Performed at [facility], [address] [place], [place] [address]  Glucose, capillary     Status: Abnormal    Collection Time: 03/20/19  7:26 AM  Result Value Ref Range    Glucose-Capillary 189 (H) 70 - 99 mg/dL       Imaging Results (Last 48 hours)  CT CHEST WO CONTRAST   Result Date: 03/18/2019 CLINICAL DATA:  Cough. EXAM: CT CHEST WITHOUT CONTRAST TECHNIQUE: Multidetector CT imaging of the chest was performed following the standard protocol without IV contrast. COMPARISON:  November 05, 2016. FINDINGS: Cardiovascular: Atherosclerosis of thoracic aorta is noted without aneurysm formation. Normal cardiac size. No pericardial effusion. Coronary artery calcifications are noted. Mediastinum/Nodes: No enlarged mediastinal or axillary lymph nodes. Status post thyroidectomy. The trachea and esophagus demonstrate no significant findings. Lungs/Pleura: Lungs are clear. No pleural effusion or pneumothorax. Upper Abdomen: Cholelithiasis is noted. Musculoskeletal: No chest wall mass or suspicious bone lesions identified. IMPRESSION: Status post thyroidectomy. Cholelithiasis. Coronary artery calcifications are noted. No acute abnormality seen in the chest. Aortic  Atherosclerosis (ICD10-I70.0). Electronically Signed   By: Marijo Conception M.D.   On: 03/18/2019 12:19             Medical Problem List and Plan: 1.  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  Antithrombotics: -DVT/anticoagulation:  Pharmaceutical: Heparin             -antiplatelet therapy: ASA/PLAVIX X 3 weeks followed by ASA alone.  3. Pain Management: tylenol prn 4. Mood: LCSW to follow for evaluation and support.              -antipsychotic agents: N/A 5. Neuropsych: This patient is capable of making decisions on his own behalf. 6. Skin/Wound Care: Routine pressure relief measures.  7. Fluids/Electrolytes/Nutrition: Monitor I/O. Has been refusing nectar liquids. Push PO  8. T2DM with neuropathy/nephropathy: Hgb A1c- Was on ozempic, Lantus, Humalog and glucotrol PTA.    1/23 sugars poorly controlled at present   -resume lantus insulin, was on 20u daily, start now 9. ESRD- on PD at nights.  On calcitriol and phoslo for  10. Hypokalemia/Hypomagnesemia: Received  2 gram IV magnesium today.  11. Anemia of chronic disease: On aranesp weekly.  12. Chronic diarrhea:Continue creon tid with meals.  14. HTN:  Monitor BP tid--on metoprolol, losartan and low dose amlodipine  1/23 fair control 15. Chronic dizziness/Orthostatic changes?:  check orthostatic vitals and monitor  16. Prolonged QT:  EKG pending       Bary Leriche, PA-C 03/20/2019   I have personally performed a face to face diagnostic evaluation of this patient and formulated the key components of the plan.  Additionally, I have personally reviewed laboratory data, imaging studies, as well as relevant notes and concur with the physician assistant's documentation above.  The patient's status has not changed from the original H&P.  Any changes in documentation from the acute care  chart have been noted above.  Meredith Staggers, MD, Mell[facility] Drown

## 2019-03-21 NOTE — Plan of Care (Signed)
  Problem: Consults Goal: RH STROKE PATIENT EDUCATION Description: See Patient Education module for education specifics  Outcome: Progressing Goal: Nutrition Consult-if indicated Outcome: Progressing Goal: Diabetes Guidelines if Diabetic/Glucose > 140 Description: If diabetic or lab glucose is > 140 mg/dl - Initiate Diabetes/Hyperglycemia Guidelines & Document Interventions  Outcome: Progressing   Problem: RH BOWEL ELIMINATION Goal: RH STG MANAGE BOWEL WITH ASSISTANCE Description: STG Manage Bowel with mod Assistance. Outcome: Progressing Goal: RH STG MANAGE BOWEL W/MEDICATION W/ASSISTANCE Description: STG Manage Bowel with Medication with mod Assistance. Outcome: Progressing   Problem: RH SKIN INTEGRITY Goal: RH STG SKIN FREE OF INFECTION/BREAKDOWN Description: Skin to remain free from infection and breakdown while on rehab with min assist. Outcome: Progressing Goal: RH STG MAINTAIN SKIN INTEGRITY WITH ASSISTANCE Description: STG Maintain Skin Integrity With min Assistance. Outcome: Progressing Goal: RH STG ABLE TO PERFORM INCISION/WOUND CARE W/ASSISTANCE Description: STG Able To Perform Wound Care With mod Assistance. Outcome: Progressing   Problem: RH SAFETY Goal: RH STG ADHERE TO SAFETY PRECAUTIONS W/ASSISTANCE/DEVICE Description: STG Adhere to Safety Precautions With mod Assistance and appropriate assistive Device. Outcome: Progressing   Problem: RH PAIN MANAGEMENT Goal: RH STG PAIN MANAGED AT OR BELOW PT'S PAIN GOAL Description: <3 on a 0-10 pain scale. Outcome: Progressing   Problem: RH KNOWLEDGE DEFICIT Goal: RH STG INCREASE KNOWLEDGE OF DIABETES Description: Patient will increase knowledge on DM medications, dietary restrictions, and follow-up care with the MD after discharge with min assist from staff. Outcome: Progressing Goal: RH STG INCREASE KNOWLEDGE OF HYPERTENSION Description: Patient will increase knowledge of HTN medications, dietary restrictions, and  follow-up care with the MD after discharge with min assist from staff. Outcome: Progressing Goal: RH STG INCREASE KNOWLEDGE OF DYSPHAGIA/FLUID INTAKE Description: Patient will increase and display knowledge of dysphagia diets and thickened liquids appropriate for his dietary restrictions with min assist from staff. Outcome: Progressing Goal: RH STG INCREASE KNOWLEGDE OF HYPERLIPIDEMIA Description: Patient will increase knowledge of HLD medications and follow-up care with the MD after discharge with min assist from staff. Outcome: Progressing Goal: RH STG INCREASE KNOWLEDGE OF STROKE PROPHYLAXIS Description: Patient will increase knowledge of medications used to prevent future strokes and follow-up care with the MD after discharge with min assist from staff. Outcome: Progressing

## 2019-03-21 NOTE — Progress Notes (Signed)
Admit: 03/20/2019 LOS: 1  40M ESRD on PD s/p acute ischemic CVA 1/14 trnsferred to [facility] CIR 1/22  Subjective:  . No issues with PD overnight . UF 2L  01/22 0701 - 01/23 0700 In: -  Out: 200 [Urine:200]  Filed Weights   03/20/19 1738  Weight: 95.4 kg    Scheduled Meds: . amLODipine  2.5 mg Oral Daily  . aspirin EC  81 mg Oral Daily  . atorvastatin  80 mg Oral Daily  . calcitRIOL  0.5 mcg Oral Daily  . calcium acetate  1,334 mg Oral TID WC  . calcium carbonate  1,250 mg Oral TID WC  . clopidogrel  75 mg Oral Daily  . [START ON 03/22/2019] epoetin (EPOGEN/PROCRIT) injection  10,000 Units Subcutaneous Weekly  . feeding supplement (NEPRO CARB STEADY)  237 mL Oral BID BM  . gabapentin  100 mg Oral BID  . gentamicin cream  1 application Topical Daily  . heparin  5,000 Units Subcutaneous Q8H  . insulin aspart  0-15 Units Subcutaneous TID WC  . insulin glargine  20 Units Subcutaneous Daily  . levETIRAcetam  500 mg Oral BID  . levothyroxine  200 mcg Oral q morning - 10a  . lipase/protease/amylase  24,000 Units Oral TID WC  . losartan  100 mg Oral Daily  . metoprolol succinate  50 mg Oral Daily  . multivitamin  1 tablet Oral QHS  . sodium bicarbonate  650 mg Oral BID   Continuous Infusions: . dialysis solution 1.5% low-MG/low-CA    . dialysis solution 1.5% low-MG/low-CA     PRN Meds:.acetaminophen, alum & mag hydroxide-simeth, bisacodyl, guaiFENesin-dextromethorphan, dianeal solution for CAPD/CCPD with heparin, polyethylene glycol, prochlorperazine **OR** prochlorperazine **OR** prochlorperazine, Resource ThickenUp Clear, sodium phosphate  Current Labs: reviewed    Physical Exam:  Blood pressure (!) 139/59, pulse 66, temperature 98.3 F (36.8 C), resp. rate 18, height 6' (1.829 m), weight 95.4 kg, SpO2 100 %. GEN: Chronically ill, NAD ENT: Left eye ptosis EYES: EOMI CV: Regular, normal S1 and S2 PULM: Clear bilaterally, normal work of breathing, ABD: Soft, nontender.  Bowel  sounds present.  PD catheter in left upper quadrant, bandaged, deferred on evaluation of exit site SKIN: No rashes or lesions EXT: No significant edema  A 1. ESRD on PD, with CCKA; CCPD 2.5L x5, dry day no pause 2. Acute ischemic L CVA 3. Anemia, not at goal 4. CKD-BMD, on C3, PhosLo; Hyperphosphatemia 5. DM2, neuropathy 6. COPD  P  Cont CCPD tonight, start all 1.5% destrose, monitor vol  Status  COnt binders, C3  Check Fe levels . Medication Issues; o Preferred narcotic agents for pain control are hydromorphone, fentanyl, and methadone. Morphine should not be used.  o Baclofen should be avoided o Avoid oral sodium phosphate and magnesium citrate based laxatives / bowel preps    Pearson Grippe MD 03/21/2019, 11:33 AM  Recent Labs  Lab 03/16/19 1443 03/20/19 0629 03/21/19 0946  NA 141 141 142  K 3.5 3.0* 3.3*  CL 102 100 99  CO2 24 26 28   GLUCOSE 73 207* 184*  BUN 50* 44* 39*  CREATININE 9.22* 8.63* 8.75*  CALCIUM 8.1* 8.6* 8.9  PHOS 9.5*  --   --    Recent Labs  Lab 03/20/19 0629  WBC 6.4  HGB 8.1*  HCT 24.0*  MCV 98.8  PLT 195

## 2019-03-21 NOTE — Evaluation (Signed)
Physical Therapy Assessment and Plan  Patient Details  Name: Kyle [name] MRN: [id number] Date of Birth: 03/14/1950  PT Diagnosis: Abnormal posture, Abnormality of gait, Ataxia, Ataxic gait, Hemiplegia dominant, Impaired cognition, Impaired sensation and Muscle weakness Rehab Potential: Good ELOS: 18-21 days   Today's Date: 03/21/2019 PT Individual Time: 1300-1355 PT Individual Time Calculation (min): 55 min    Problem List:  Patient Active Problem List   Diagnosis Date Noted  . 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[place]) 03/20/2019  . Generalized weakness   . Diabetic polyneuropathy associated with type 2 diabetes mellitus ([place])   . Cerebrovascular accident (CVA) ([place])   . PVD (peripheral vascular disease) ([place]) 03/17/2019  . Ptosis of eyelid, left 03/15/2019  . Physical deconditioning 03/14/2019  . Hypomagnesemia 03/13/2019  . Hyperphosphatemia 03/13/2019  . ESRD (end stage renal disease) ([place]) 03/13/2019  . Chronic kidney disease with peritoneal dialysis as preferred modality, stage 5 ([place]) 03/13/2019  . Prolonged QT interval 03/13/2019  . Essential hypertension 03/13/2019  . Hypothyroidism 03/13/2019  . Chronic diarrhea 03/13/2019  . Hypocalcemia 03/12/2019  . Hypokalemia 09/16/2018  . Lung nodule 06/12/2016  . Narrowing of airway   . Cigarette smoker 05/24/2016  . Collapse of right lung 05/24/2016  . COPD with chronic bronchitis ([place]) 05/24/2016  . Acne 05/20/2015  . Calculus of kidney 05/20/2015  . Chest pain, non-cardiac 05/20/2015  . Seizure ([place]) 05/20/2015  . Current tobacco use 05/20/2015  . Chronic kidney disease (CKD), stage III (moderate) 03/29/2015  . Type 2 diabetes mellitus with hyperlipidemia ([place]) 03/15/2015  . Lipoma of shoulder 03/08/2014  . Other synovitis and tenosynovitis, right shoulder 03/08/2014  . Bursitis of elbow 02/15/2014  . Absolute anemia 08/08/2013  . 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 Back pain, chronic 08/08/2013  . Chronic obstructive  pulmonary disease ([place]) 08/08/2013  . Diabetes mellitus ([place]) 08/08/2013  . BP (high blood pressure) 08/08/2013  . HLD (hyperlipidemia) 08/08/2013  . Acne erythematosa 08/08/2013    Past Medical History:  Past Medical History:  Diagnosis Date  . Anxiety   . BPH (benign prostatic hyperplasia)   . Chronic kidney disease   . Colon polyps   . COPD (chronic obstructive pulmonary disease) ([place])   . Depression   . Diabetes mellitus without complication ([place])   . Diverticulosis of colon   . Hyperlipidemia   . Hypertension   . Hypothyroidism   . Nephrolithiasis   . Seizures ([place])    Past Surgical History:  Past Surgical History:  Procedure Laterality Date  . COLONOSCOPY    . COLONOSCOPY WITH PROPOFOL N/A 05/30/2015   Procedure: COLONOSCOPY WITH PROPOFOL;  Surgeon: Manya Silvas, MD;  Location: [facility] ENDOSCOPY;  Service: Endoscopy;  Laterality: N/A;  . COLONOSCOPY WITH PROPOFOL N/A 12/01/2018   Procedure: COLONOSCOPY WITH PROPOFOL;  Surgeon: Toledo, [name], MD;  Location: [facility] ENDOSCOPY;  Service: Gastroenterology;  Laterality: N/A;  . kidney stone    . THYROIDECTOMY    . VARICOCELE EXCISION    . VIDEO BRONCHOSCOPY Bilateral 05/25/2016   Procedure: VIDEO BRONCHOSCOPY WITHOUT FLUORO;  Surgeon: Juanito Doom, MD;  Location: [facility] ENDOSCOPY;  Service: Cardiopulmonary;  Laterality: Bilateral;    Assessment & Plan Clinical Impression: Patient is a 69 year old male with history of T2DM with neuropathy and nephropathy, ESRD-on PD, seizure d/o, COPD, morbid obesity, gait disorder with falls, ongoing work up for chronic diarrhea who was admitted to [facility] on 03/12/19 with significant hypocalcemia, confusion and weakness. Wife reported having missed dialysis for  3 days? And patient with elevated phosphorous and anion gap. He was started on IV calcium and phosphate binders--did have a fall in ED while trying to get to the bathroom independently. CT head negative for acute changes. He developed  left ptosis, decreased in ability to use RUE and PT evaluation revealed balance deficits with significant left lean on standing with poor safety awareness. 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 2D echo showed EF 65-70% with mild increase in left ventricular wall and grade 1 DD. Carotid dopplers was negative for significant ICA stenosis. Dr. Doy Mince felt that stroke was due to small vessel disease and recommended DAPT x 3 weeks followed by ASA alone. He continues to have bouts of confusion with hallucinations as well as electrolyte abnormalities. ST recommended downgrading diet to dysphagia 2, nectarsdue to mild to moderate oropharyngeal dysphagia.He continues to be limited decreased balance with ataxia, decreased awareness of deficits and impulsivity  Patient transferred to CIR on 03/20/2019 .   Patient currently requires max with mobility secondary to muscle weakness and muscle joint tightness, decreased cardiorespiratoy endurance, impaired timing and sequencing, unbalanced muscle activation, ataxia and decreased coordination, decreased visual acuity, decreased visual motor skills and field cut, decreased attention to right, decreased initiation, decreased attention, decreased awareness, decreased problem solving, decreased safety awareness, decreased memory and delayed processing and decreased sitting balance, decreased standing balance, decreased postural control, hemiplegia and decreased balance strategies.  Prior to hospitalization, patient was independent  with mobility and lived with Spouse([name]) in a House home.  Home access is 6Stairs to enter.  Patient will benefit from skilled PT intervention to maximize safe functional mobility, minimize fall risk and decrease caregiver burden for planned discharge home with 24 hour assist.  Anticipate patient will benefit from follow up [facility] at discharge.  PT - End of  Session Activity Tolerance: Tolerates < 10 min activity, no significant change in vital signs Endurance Deficit: Yes PT Assessment Rehab Potential (ACUTE/IP ONLY): Good PT Barriers to Discharge: [place] home environment;Medical stability;Home environment access/layout;Wound Care;Insurance for SNF coverage PT Patient demonstrates impairments in the following area(s): Balance;Behavior;Edema;Endurance;Motor;Nutrition;Perception;Pain;Safety;Sensory;Skin Integrity PT Transfers Functional Problem(s): Bed Mobility;Bed to Chair;Car;Furniture;Floor PT Locomotion Functional Problem(s): Ambulation;Stairs;Wheelchair Mobility PT Plan PT Intensity: Minimum of 1-2 x/day ,45 to 90 minutes PT Frequency: 5 out of 7 days PT Duration Estimated Length of Stay: 18-21 days PT Treatment/Interventions: Ambulation/gait training;Discharge planning;Functional mobility training;Psychosocial support;Therapeutic Activities;Visual/perceptual remediation/compensation;Wheelchair propulsion/positioning;Therapeutic Exercise;Skin care/wound management;Neuromuscular re-education;Disease management/prevention;Balance/vestibular training;Cognitive remediation/compensation;DME/adaptive equipment instruction;Pain management;UE/LE Strength taining/ROM;Splinting/orthotics;UE/LE Coordination activities;Stair training;Patient/family education;Functional electrical stimulation;Community reintegration PT Transfers Anticipated Outcome(s): CGA with LRAD PT Locomotion Anticipated Outcome(s): CGA with LRAD at ambulatory level at house hold distances. PT Recommendation Follow Up Recommendations: Home health PT Patient destination: Home Equipment Recommended: Wheelchair (measurements);Wheelchair cushion (measurements)  Skilled Therapeutic Intervention Pt received supine in bed and agreeable to PT. Supine>sit transfer with mod assist and moderate cues for safety and use of UE support on rails as needed. Pt able to maintain sitting balance EOB  with supervision assist and 1 UE support on rail. Sit<>stand with mod assist with constant posterior lean. Squat pivot transfer to the L with mod assist. Gait training with R UE over therapist shoulder with mod-max assist to prevent R LOB due to poor sequencing. Gait training also performed with RW and mod fading to max assist with increasing knee flexion and poor awareness from pt, limiting ability to correct. Stair management with BUE support and mod-max assist  with max cues for step to gait pattern; severe R LE instability on second step; unable to perform without UE support. Car transfer with mod assist into car on the L and max assist to the R to prevent LOB due to severe ataxia resulting in poor LE placement and knee collapse. Pt returned to room and performed stand pivot to bed with max assist on the R. Sit>supine completed with mod assist for BLE management, and left supine in bed with call bell in reach and all needs met.     PT Evaluation Precautions/Restrictions Precautions Precautions: Fall Pain   denies Home Living/Prior Functioning Home Living Available Help at Discharge: Family;Available 24 hours/day Type of Home: House Home Access: Stairs to enter [employer] of Steps: 6 Entrance Stairs-Rails: Right Home Layout: Two level Bathroom Shower/Tub: Multimedia programmer: Handicapped height Bathroom Accessibility: Yes  Lives With: Spouse([name]) Prior Function Level of Independence: Independent with basic ADLs;Independent with homemaking with ambulation(has SPC that he uses occassionally) Driving: Yes Vocation: Retired Vision/Perception  Vision - Assessment Additional Comments: L eye ptosis Perception Perception: Impaired Inattention/Neglect: Does not attend to right visual field;Does not attend to right side of body Praxis Praxis: Impaired Praxis Impairment Details: Initiation;Motor planning  Cognition Arousal/Alertness: Awake/alert Immediate Memory  Recall: Sock;Blue;Bed Memory Recall Sock: Without Cue Memory Recall Blue: Without Cue Memory Recall Bed: Without Cue Awareness: Impaired Awareness Impairment: Intellectual impairment Behaviors: Impulsive Safety/Judgment: Impaired(pt impulsively leaning too far forward during ADL task and needed A to prevent fall) Sensation Sensation Light Touch: Appears Intact Proprioception: Impaired Detail Proprioception Impaired Details: Impaired RLE;Impaired RUE Coordination Gross Motor Movements are Fluid and Coordinated: No Fine Motor Movements are Fluid and Coordinated: No Coordination and Movement Description: Rt hemiplegia, ataxic with poor postural control Finger Nose Finger Test: Overshoots Rt>Lt Heel Shin Test: ataxia on the R, dysmetric on the L Motor  Motor Motor: Hemiplegia;Ataxia;Abnormal postural alignment and control Motor - Skilled Clinical Observations: R sided ataxia with mild hemiplegia  Mobility Bed Mobility Bed Mobility: Rolling Right;Rolling Left;Sit to Supine;Supine to Sit Rolling Right: Minimal Assistance - Patient > 75% Rolling Left: Minimal Assistance - Patient > 75% Supine to Sit: Minimal Assistance - Patient > 75% Sit to Supine: Minimal Assistance - Patient > 75% Transfers Transfers: Sit to [employer] Transfers Sit to Stand: Moderate Assistance - Patient 50-74% Stand Pivot Transfers: Moderate Assistance - Patient 50 - 74% Transfer (Assistive device): None Locomotion  Gait Ambulation: Yes Gait Assistance: Moderate Assistance - Patient 50-74% Gait Distance (Feet): 25 Feet Assistive device: Other (Comment);None(RUE over therapist shoulder) Gait Gait: Yes Gait Pattern: Ataxic;Right flexed knee in stance;Abducted- right;Poor foot clearance - right;Shuffle Stairs / Additional Locomotion Stairs: No Wheelchair Mobility Wheelchair Mobility: Yes Wheelchair Assistance: Minimal assistance - Patient >75% Wheelchair Propulsion: Both upper  extremities Wheelchair Parts Management: Needs assistance Distance: 111f  Trunk/Postural Assessment  Cervical Assessment Cervical Assessment: Exceptions to WFL(forward head) Thoracic Assessment Thoracic Assessment: Exceptions to WFL(rounded shoulders, Rt lean in sitting + standing) Lumbar Assessment Lumbar Assessment: Exceptions to WFL(posterior pelvic tilt) Postural Control Postural Control: Deficits on evaluation(impaired, posterior bias in sitting + standing as well as Rt lean. Needed assist to recover from all seated LOBs)  Balance Balance Balance Assessed: Yes Static Sitting Balance Static Sitting - Balance Support: No upper extremity supported Static Sitting - Level of Assistance: 4: Min assist;5: Stand by assistance Dynamic Sitting Balance Dynamic Sitting - Balance Support: Left upper extremity supported Dynamic Sitting - Level of Assistance: 4: Min assist STheatre stage manager  Standing - Balance Support: No upper extremity supported Static Standing - Level of Assistance: 3: Mod assist Dynamic Standing Balance Dynamic Standing - Balance Support: No upper extremity supported Dynamic Standing - Level of Assistance: 2: Max assist;3: Mod assist Dynamic Standing - Balance Activities: Lateral lean/weight shifting;Forward lean/weight shifting Extremity Assessment  RUE Assessment RUE Assessment: Within Functional Limits(ataxic) Active Range of Motion (AROM) Comments: ~120 degrees shoulder abduction and scaption, WNL other ranges General Strength Comments: 4-/5 grossly LUE Assessment LUE Assessment: Exceptions to WFL(mildly ataxic) Active Range of Motion (AROM) Comments: ~120 degrees shoulder abduction and scaption, WNL other ranges General Strength Comments: 4/5 grossly RLE Assessment RLE Assessment: Exceptions to [facility] General Strength Comments: grossly 4+/5 proximal to distal - ataxic LLE Assessment LLE Assessment: Within Functional Limits General Strength Comments:  grossly 5/5 with mild dysmetria    Refer to Care Plan for Long Term Goals  Recommendations for other services: None   Discharge Criteria: Patient will be discharged from PT if patient refuses treatment 3 consecutive times without medical reason, if treatment goals not met, if there is a change in medical status, if patient makes no progress towards goals or if patient is discharged from hospital.  The above assessment, treatment plan, treatment alternatives and goals were discussed and mutually agreed upon: by patient  Lorie Phenix 03/21/2019, 1:58 PM

## 2019-03-21 NOTE — Evaluation (Signed)
Occupational Therapy Assessment and Plan  Patient Details  Name: Kyle [name] MRN: [id number] Date of Birth: 08-11-1950  OT Diagnosis: abnormal posture, apraxia, ataxia, cognitive deficits, disturbance of vision, hemiplegia affecting dominant side and muscle weakness (generalized) Rehab Potential: Rehab Potential (ACUTE ONLY): Good ELOS: 2 to 3 weeks  Today's Date: 03/21/2019 OT Individual Time: 4332-9518 OT Individual Time Calculation (min): 61 min     Problem List:  Patient Active Problem List   Diagnosis Date Noted  . 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[place]) 03/20/2019  . Generalized weakness   . Diabetic polyneuropathy associated with type 2 diabetes mellitus ([place])   . Cerebrovascular accident (CVA) ([place])   . PVD (peripheral vascular disease) ([place]) 03/17/2019  . Ptosis of eyelid, left 03/15/2019  . Physical deconditioning 03/14/2019  . Hypomagnesemia 03/13/2019  . Hyperphosphatemia 03/13/2019  . ESRD (end stage renal disease) ([place]) 03/13/2019  . Chronic kidney disease with peritoneal dialysis as preferred modality, stage 5 ([place]) 03/13/2019  . Prolonged QT interval 03/13/2019  . Essential hypertension 03/13/2019  . Hypothyroidism 03/13/2019  . Chronic diarrhea 03/13/2019  . Hypocalcemia 03/12/2019  . Hypokalemia 09/16/2018  . Lung nodule 06/12/2016  . Narrowing of airway   . Cigarette smoker 05/24/2016  . Collapse of right lung 05/24/2016  . COPD with chronic bronchitis ([place]) 05/24/2016  . Acne 05/20/2015  . Calculus of kidney 05/20/2015  . Chest pain, non-cardiac 05/20/2015  . Seizure ([place]) 05/20/2015  . Current tobacco use 05/20/2015  . Chronic kidney disease (CKD), stage III (moderate) 03/29/2015  . Type 2 diabetes mellitus with hyperlipidemia ([place]) 03/15/2015  . Lipoma of shoulder 03/08/2014  . Other synovitis and tenosynovitis, right shoulder 03/08/2014  . Bursitis of elbow 02/15/2014  . Absolute anemia 08/08/2013  . 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 Back pain,  chronic 08/08/2013  . Chronic obstructive pulmonary disease ([place]) 08/08/2013  . Diabetes mellitus ([place]) 08/08/2013  . BP (high blood pressure) 08/08/2013  . HLD (hyperlipidemia) 08/08/2013  . Acne erythematosa 08/08/2013    Past Medical History:  Past Medical History:  Diagnosis Date  . Anxiety   . BPH (benign prostatic hyperplasia)   . Chronic kidney disease   . Colon polyps   . COPD (chronic obstructive pulmonary disease) ([place])   . Depression   . Diabetes mellitus without complication ([place])   . Diverticulosis of colon   . Hyperlipidemia   . Hypertension   . Hypothyroidism   . Nephrolithiasis   . Seizures ([place])    Past Surgical History:  Past Surgical History:  Procedure Laterality Date  . COLONOSCOPY    . COLONOSCOPY WITH PROPOFOL N/A 05/30/2015   Procedure: COLONOSCOPY WITH PROPOFOL;  Surgeon: Manya Silvas, MD;  Location: [facility] ENDOSCOPY;  Service: Endoscopy;  Laterality: N/A;  . COLONOSCOPY WITH PROPOFOL N/A 12/01/2018   Procedure: COLONOSCOPY WITH PROPOFOL;  Surgeon: Toledo, [name], MD;  Location: [facility] ENDOSCOPY;  Service: Gastroenterology;  Laterality: N/A;  . kidney stone    . THYROIDECTOMY    . VARICOCELE EXCISION    . VIDEO BRONCHOSCOPY Bilateral 05/25/2016   Procedure: VIDEO BRONCHOSCOPY WITHOUT FLUORO;  Surgeon: Juanito Doom, MD;  Location: [facility] ENDOSCOPY;  Service: Cardiopulmonary;  Laterality: Bilateral;    Assessment & Plan Clinical Impression:  Kyle [name] is a 69 year old male with history of T2DM with neuropathy and nephropathy, ESRD-on PD, seizure d/o, COPD, morbid obesity, gait disorder with falls, ongoing work up for chronic diarrhea who was admitted to [facility] on 03/12/19 with significant hypocalcemia,  confusion and weakness. Wife reported having missed dialysis for 3 days? And patient with elevated phosphorous and anion gap. He was started on IV calcium and phosphate binders--did have a fall in ED while trying to get to the bathroom  independently. CT head negative for acute changes.  He developed left ptosis, decreased in ability to use RUE and PT evaluation revealed balance deficits with significant left lean on standing with poor safety awareness.  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  2D echo showed EF 65-70% with mild increase in left ventricular wall and grade 1 DD. Carotid dopplers was negative for significant ICA stenosis. Dr. Doy Mince felt that stroke was due to small vessel disease and recommended DAPT x 3 weeks followed by ASA alone.  He continues to have bouts of confusion with hallucinations as well as electrolyte abnormalities. ST recommended downgrading diet to dysphagia 2, nectars due to mild to moderate oropharyngeal dysphagia.  He continues to be limited decreased balance with ataxia, decreased awareness of deficits and impulsivity .  CIR recommended due to functional deficits due to weakness and stroke.   Patient currently requires Mod max with basic self-care skills secondary to muscle weakness, decreased cardiorespiratoy endurance, impaired timing and sequencing, unbalanced muscle activation, ataxia, decreased coordination and decreased motor planning, decreased visual perceptual skills, decreased midline orientation and decreased attention to right, decreased initiation, decreased awareness and decreased safety awareness and decreased sitting balance, decreased standing balance, decreased postural control and hemiplegia.  Prior to hospitalization, patient could complete BADLs with independent .  Patient will benefit from skilled intervention to increase independence with basic self-care skills prior to discharge home with spouse.  Anticipate patient will require 24 hour supervision and follow up home health.  OT - End of Session Endurance Deficit: Yes OT Assessment Rehab Potential (ACUTE ONLY): Good OT Barriers to Discharge:  Medical stability;Lack of/limited family support;Behavior OT Patient demonstrates impairments in the following area(s): Balance;Perception;Behavior;Safety;Cognition;Vision;Endurance;Motor OT Basic ADL's Functional Problem(s): Eating;Grooming;Bathing;Dressing;Toileting OT Advanced ADL's Functional Problem(s): Simple Meal Preparation OT Transfers Functional Problem(s): Toilet;Tub/Shower OT Additional Impairment(s): Fuctional Use of Upper Extremity OT Plan OT Intensity: Minimum of 1-2 x/day, 45 to 90 minutes OT Frequency: 5 out of 7 days OT Duration/Estimated Length of Stay: 14-16 days OT Treatment/Interventions: Balance/vestibular training;Discharge planning;Pain management;Self Care/advanced ADL retraining;Therapeutic Activities;UE/LE Coordination activities;Visual/perceptual remediation/compensation;Therapeutic Exercise;Patient/family education;Functional mobility training;Disease mangement/prevention;Cognitive remediation/compensation;Community reintegration;DME/adaptive equipment instruction;Neuromuscular re-education;Psychosocial support;UE/LE Strength taining/ROM;Wheelchair propulsion/positioning OT Self Feeding Anticipated Outcome(s): Supervision/cuing OT Basic Self-Care Anticipated Outcome(s): Supervision/cuing OT Toileting Anticipated Outcome(s): Supervision/cuing OT Bathroom Transfers Anticipated Outcome(s): Supervision/cuing-CGA OT Recommendation Patient destination: Home Follow Up Recommendations: Home health OT Equipment Recommended: To be determined  Skilled Therapeutic Intervention Skilled OT session completed with focus on initial evaluation, education on OT role/POC, and establishment of patient-centered goals.   Pt greeted in bed with RN present, finishing up with morning medication. Mod A for supine<sit with vcs to incorporate Rt hand during transition EOB. Pt with UE ataxia Rt>Lt. While sitting up during bathing/dressing tasks, pt often losing his balance backwards and  towards the Rt side, needing A to correct. Noted some visual and depth perception deficits when reaching for needed items as well. Max A for dynamic sitting balance when threading R LE into pants. Mod A for sit<stand and Max A for dynamic standing while he assisted OT with elevating pants. Pt able to utilize figure 4 with L LE and modified figure 4 with R LE when donning gripper  socks. At end of session pt returned to bed and boosted himself up using all 4 limbs when given instruction. Left him in bed with all needs within reach and bed alarm set.   Pt very impulsive during session with poor awareness of functional deficits.   OT Evaluation Precautions/Restrictions  Precautions Precautions: Fall Restrictions Weight Bearing Restrictions: No Pain: No c/o pain during tx   Home Living/Prior Functioning Home Living Family/patient expects to be discharged to:: Private residence Living Arrangements: Spouse/significant other Available Help at Discharge: Family, Available 24 hours/day Type of Home: House Home Access: Stairs to enter Technical brewer of Steps: 6 Home Layout: One level Bathroom Shower/Tub: Multimedia programmer: Handicapped height Bathroom Accessibility: Yes  Lives With: Spouse([name]) IADL History Homemaking Responsibilities: Yes(pt states he did "whatever needs to be done" in terms of IADL PTA, unable to name specific roles or chores) Occupation: Retired Type of Occupation: Designer, jewellery, retired for 4 years Leisure and Hobbies: Designer, multimedia" Prior Function Level of Independence: Independent with basic ADLs, Independent with homemaking with ambulation(per pt report) Driving: Yes ADL ADL Eating: Not assessed Grooming: Maximal assistance Where Assessed-Grooming: Edge of bed Upper Body Bathing: Minimal assistance Where Assessed-Upper Body Bathing: Edge of bed Lower Body Bathing: Maximal assistance Where Assessed-Lower Body Bathing: Edge of  bed Upper Body Dressing: Moderate assistance Where Assessed-Upper Body Dressing: Edge of bed Lower Body Dressing: Maximal assistance Where Assessed-Lower Body Dressing: Edge of bed Toileting: Not assessed Toilet Transfer: Not assessed Tub/Shower Transfer: Not assessed Vision Baseline Vision/History: Wears glasses Wears Glasses: Reading only Patient Visual Report: No change from baseline Vision Assessment?: Vision impaired- to be further tested in functional context(Maintains Lt eye closed as he states he is unable to open it. Noted impairments with functional scanning and depth perception) Perception  Perception: Impaired Inattention/Neglect: Does not attend to right visual field;Does not attend to right side of body Praxis Praxis: Impaired Praxis Impairment Details: Initiation;Motor planning Cognition Arousal/Alertness: Awake/alert Orientation Level: Person;Place;Situation Person: Oriented Place: Oriented Situation: Oriented Year: 2021 Month: January Day of Week: Incorrect Immediate Memory Recall: Sock;Blue;Bed Memory Recall Sock: Without Cue Memory Recall Blue: Without Cue Memory Recall Bed: Without Cue Awareness: Impaired Awareness Impairment: Intellectual impairment Behaviors: Impulsive Safety/Judgment: Impaired(pt impulsively leaning too far forward during ADL task and needed A to prevent fall) Sensation Sensation Light Touch: Appears Intact(B UEs) Proprioception: Appears Intact(UEs) Coordination Gross Motor Movements are Fluid and Coordinated: No Fine Motor Movements are Fluid and Coordinated: No Coordination and Movement Description: Rt hemiplegia, ataxic with poor postural control Finger Nose Finger Test: Overshoots Rt>Lt Motor  Motor Motor: Hemiplegia;Ataxia;Abnormal postural alignment and control Trunk/Postural Assessment  Cervical Assessment Cervical Assessment: Exceptions to WFL(forward head) Thoracic Assessment Thoracic Assessment: Exceptions to  WFL(rounded shoulders, Rt lean in sitting + standing) Lumbar Assessment Lumbar Assessment: Exceptions to WFL(posterior pelvic tilt) Postural Control Postural Control: Deficits on evaluation(impaired, posterior bias in sitting + standing as well as Rt lean. Needed assist to recover from all seated LOBs)  Balance Balance Balance Assessed: Yes Dynamic Sitting Balance Dynamic Sitting - Balance Support: No upper extremity supported;During functional activity(threading LEs into pants) Dynamic Sitting - Level of Assistance: 2: Max assist Dynamic Standing Balance Dynamic Standing - Balance Support: During functional activity;Right upper extremity supported(pulling up pants) Dynamic Standing - Level of Assistance: 2: Max assist Dynamic Standing - Balance Activities: Lateral lean/weight shifting;Forward lean/weight shifting Extremity/Trunk Assessment RUE Assessment RUE Assessment: Within Functional Limits(ataxic) Active Range of Motion (AROM) Comments: ~120 degrees shoulder abduction and scaption, WNL other ranges  General Strength Comments: 4-/5 grossly LUE Assessment LUE Assessment: Exceptions to WFL(mildly ataxic) Active Range of Motion (AROM) Comments: ~120 degrees shoulder abduction and scaption, WNL other ranges General Strength Comments: 4/5 grossly   Refer to Care Plan for Long Term Goals  Recommendations for other services: None    Discharge Criteria: Patient will be discharged from OT if patient refuses treatment 3 consecutive times without medical reason, if treatment goals not met, if there is a change in medical status, if patient makes no progress towards goals or if patient is discharged from hospital.  The above assessment, treatment plan, treatment alternatives and goals were discussed and mutually agreed upon: by patient  Skeet Simmer 03/21/2019, 12:34 PM

## 2019-03-22 DIAGNOSIS — I6350 Cerebral infarction due to unspecified occlusion or stenosis of unspecified cerebral artery: Secondary | ICD-10-CM

## 2019-03-22 DIAGNOSIS — R5383 Other fatigue: Secondary | ICD-10-CM

## 2019-03-22 LAB — URINALYSIS, COMPLETE (UACMP) WITH MICROSCOPIC
Bacteria, UA: NONE SEEN
Bilirubin Urine: NEGATIVE
Glucose, UA: >=500 mg/dL — AB
Hgb urine dipstick: NEGATIVE
Ketones, ur: NEGATIVE mg/dL
Leukocytes,Ua: NEGATIVE
Nitrite: NEGATIVE
Protein, ur: >=300 mg/dL — AB
Specific Gravity, Urine: 1.018 (ref 1.005–1.030)
pH: 5 (ref 5.0–8.0)

## 2019-03-22 LAB — GLUCOSE, CAPILLARY
Glucose-Capillary: 268 mg/dL — ABNORMAL HIGH (ref 70–99)
Glucose-Capillary: 195 mg/dL — ABNORMAL HIGH (ref 70–99)
Glucose-Capillary: 154 mg/dL — ABNORMAL HIGH (ref 70–99)
Glucose-Capillary: 236 mg/dL — ABNORMAL HIGH (ref 70–99)
Glucose-Capillary: 181 mg/dL — ABNORMAL HIGH (ref 70–99)

## 2019-03-22 MED ORDER — SODIUM CHLORIDE 0.9 % IV SOLN
125.0000 mg | Freq: Every day | INTRAVENOUS | Status: AC
  Administered 2019-03-22 – 2019-03-29 (×8): 125 mg via INTRAVENOUS
  Filled 2019-03-22 (×9): qty 10

## 2019-03-22 NOTE — Progress Notes (Signed)
[facility] PHYSICAL MEDICINE & REHABILITATION PROGRESS NOTE   Subjective/Complaints: Pt slow to arouse this morning when I rounded. RN contacted me later that patient was still difficult to awake. When I came back into the room pt was awake and fairly alert. He said he did feel tired. Denies problems sleeping last night  ROS: Patient denies fever, rash, sore throat, blurred vision, nausea, vomiting, diarrhea, cough, shortness of breath or chest pain, joint or back pain, headache, or mood change.    Objective:   No results found. Recent Labs    03/20/19 0629  WBC 6.4  HGB 8.1*  HCT 24.0*  PLT 195   Recent Labs    03/20/19 0629 03/21/19 0946  NA 141 142  K 3.0* 3.3*  CL 100 99  CO2 26 28  GLUCOSE 207* 184*  BUN 44* 39*  CREATININE 8.63* 8.75*  CALCIUM 8.6* 8.9    Intake/Output Summary (Last 24 hours) at 03/22/2019 1151 Last data filed at 03/22/2019 0819 Gross per 24 hour  Intake 748 ml  Output --  Net 748 ml     Physical Exam: Vital Signs Blood pressure 119/62, pulse 61, temperature 97.8 F (36.6 C), temperature source Oral, resp. rate 14, height 6' (1.829 m), weight 92.1 kg, SpO2 98 %. Constitutional: No distress . Vital signs reviewed. HEENT: EOMI, oral membranes moist Neck: supple Cardiovascular: RRR without murmur. No JVD    Respiratory: CTA Bilaterally without wheezes or rales. Normal effort    GI: BS +, non-tender, non-distended   Musculoskeletal:  General: Normal range of motion.  Cervical back: Normal range of motion.  Neurological:awake, a little lethargic. Followed basic commands. Normal language. Speech dysarthric. Left CN III palsy with ptosis and decreased medial deviation of left eye. Poor depth perception Mild right HP. No focal sensory deficits.  Skin: Skin iswarm.  Psychiatric: flat    Assessment/Plan: 1. Functional deficits secondary to left paramedian midbrain infarct which require 3+ hours per day of interdisciplinary therapy  in a comprehensive inpatient rehab setting.  Physiatrist is providing close team supervision and 24 hour management of active medical problems listed below.  Physiatrist and rehab team continue to assess barriers to discharge/monitor patient progress toward functional and medical goals  Care Tool:  Bathing    Body parts bathed by patient: Right arm, Left arm, Chest, Abdomen, Front perineal area, Right upper leg, Left upper leg, Face   Body parts bathed by helper: Buttocks, Right lower leg, Left lower leg     Bathing assist Assist Level: Moderate Assistance - Patient 50 - 74%     Upper Body Dressing/Undressing Upper body dressing   What is the patient wearing?: Pull over shirt    Upper body assist Assist Level: Moderate Assistance - Patient 50 - 74%    Lower Body Dressing/Undressing Lower body dressing      What is the patient wearing?: Incontinence brief, Pants     Lower body assist Assist for lower body dressing: Maximal Assistance - Patient 25 - 49%     Toileting Toileting Toileting Activity did not occur (Clothing management and hygiene only): N/A (no void or bm)  Toileting assist Assist for toileting: Total Assistance - Patient < 25%     Transfers Chair/bed transfer  Transfers assist  Chair/bed transfer activity did not occur: Safety/medical concerns  Chair/bed transfer assist level: Maximal Assistance - Patient 25 - 49%     Locomotion Ambulation   Ambulation assist      Assist level: Maximal Assistance -  Patient 25 - 49% Assistive device: No Device Max distance: 25   Walk 10 feet activity   Assist     Assist level: Maximal Assistance - Patient 25 - 49% Assistive device: No Device   Walk 50 feet activity   Assist Walk 50 feet with 2 turns activity did not occur: Safety/medical concerns         Walk 150 feet activity   Assist Walk 150 feet activity did not occur: Safety/medical concerns         Walk 10 feet on uneven surface   activity   Assist           Wheelchair     Assist   Type of Wheelchair: Manual    Wheelchair assist level: Minimal Assistance - Patient > 75% Max wheelchair distance: 129ft    Wheelchair 50 feet with 2 turns activity    Assist        Assist Level: Minimal Assistance - Patient > 75%   Wheelchair 150 feet activity     Assist  Wheelchair 150 feet activity did not occur: Safety/medical concerns       Blood pressure 119/62, pulse 61, temperature 97.8 F (36.6 C), temperature source Oral, resp. rate 14, height 6' (1.829 m), weight 92.1 kg, SpO2 98 %.  Medical Problem List and Plan: 1.Functional deficits and right hemiparesissecondary to left paramedian midbrain infarct dt SVD -patient maymayshower -ELOS/Goals: mod I to min assist, 10-14 days  --Continue CIR therapies including PT, OT, and SLP  2. Antithrombotics: -DVT/anticoagulation:Pharmaceutical:Heparin -antiplatelet therapy: ASA/PLAVIX X 3 weeks followed by ASA alone. 3. Pain Management:tylenol prn 4. Mood:LCSW to follow for evaluation and support. -antipsychotic agents: N/A 5. Neuropsych: This patientiscapable of making decisions on hisown behalf. 6. Skin/Wound Care:Routine pressure relief measures. 7. Fluids/Electrolytes/Nutrition:Monitor I/O. Has been refusing nectar liquids. Push PO 8. T2DM with neuropathy/nephropathy: Hgb A1c- Was on ozempic, Lantus, Humalog and glucotrol PTA.               1/23 sugars poorly controlled at present                         -resume lantus insulin, was on 20u daily   1/24-monitor sugars today with addition of lantus 9. ESRD- on PD at nights.On calcitriol and phoslo for 10. Hypokalemia/Hypomagnesemia: supps per nephro.  11. Anemia of chronic disease: On aranesp weekly.  12. Chronic diarrhea:Continue creon tid with meals.   -no diarrhea currently 14.HTN: Monitor BP tid--on metoprolol, losartan  and low dose amlodipine             1/24 fair control 15. Chronic dizziness/Orthostatic changes?:  no dizziness reported   -will check orthostatic VS x 2 days 16. Prolonged QTC: QTC 528 on EKG 1/14 17. Lethargy: intermittent  -may be due to midbrain infarct  -elevated CBG's not helping either  -most recent hgb 8.1   -check CMET, CBC tomorrow  -check ua,ucx today as he is making some urine    LOS: 2 days A FACE TO FACE EVALUATION WAS PERFORMED  Meredith Staggers 03/22/2019, 11:51 AM

## 2019-03-22 NOTE — Progress Notes (Signed)
Admit: 03/20/2019 LOS: 2  52M ESRD on PD s/p acute ischemic CVA 1/14 trnsferred to [facility] CIR 1/22  Subjective:  . No issues with PD overnight . UF 2.1L . Very groggy this morning, will arouse to stimulation . No labs today  01/23 0701 - 01/24 0700 In: 28786 [P.O.:688] Out: 76720   Filed Weights   03/20/19 1738 03/21/19 1841 03/22/19 0917  Weight: 95.4 kg 92.9 kg 92.1 kg    Scheduled Meds: . amLODipine  2.5 mg Oral Daily  . aspirin EC  81 mg Oral Daily  . atorvastatin  80 mg Oral Daily  . calcitRIOL  0.5 mcg Oral Daily  . calcium acetate  1,334 mg Oral TID WC  . calcium carbonate  1,250 mg Oral TID WC  . clopidogrel  75 mg Oral Daily  . epoetin (EPOGEN/PROCRIT) injection  10,000 Units Subcutaneous Weekly  . feeding supplement (NEPRO CARB STEADY)  237 mL Oral BID BM  . gabapentin  100 mg Oral BID  . gentamicin cream  1 application Topical Daily  . heparin  5,000 Units Subcutaneous Q8H  . insulin aspart  0-15 Units Subcutaneous TID WC  . insulin glargine  20 Units Subcutaneous Daily  . levETIRAcetam  500 mg Oral BID  . levothyroxine  200 mcg Oral q morning - 10a  . lipase/protease/amylase  24,000 Units Oral TID WC  . losartan  100 mg Oral Daily  . metoprolol succinate  50 mg Oral Daily  . multivitamin  1 tablet Oral QHS  . sodium bicarbonate  650 mg Oral BID   Continuous Infusions: . dialysis solution 1.5% low-MG/low-CA    . dialysis solution 1.5% low-MG/low-CA     PRN Meds:.acetaminophen, alum & mag hydroxide-simeth, bisacodyl, guaiFENesin-dextromethorphan, dianeal solution for CAPD/CCPD with heparin, polyethylene glycol, prochlorperazine **OR** prochlorperazine **OR** prochlorperazine, Resource ThickenUp Clear, sodium phosphate  Current Labs: reviewed  Results for Kyle [name], Kyle MARK "Harris" (MRN [id number]) as of 03/22/2019 11:27  Ref. Range 03/21/2019 09:46  Saturation Ratios Latest Ref Range: 17.9 - 39.5 % 20  Ferritin Latest Ref Range: 24 - 336 ng/mL 321     Physical Exam:  Blood pressure 119/62, pulse 61, temperature 97.8 F (36.6 C), temperature source Oral, resp. rate 14, height 6' (1.829 m), weight 92.1 kg, SpO2 98 %. GEN: Chronically ill, NAD ENT: Left eye ptosis EYES: EOMI CV: Regular, normal S1 and S2 PULM: Clear bilaterally, normal work of breathing, ABD: Soft, nontender.  Bowel sounds present.  PD catheter in left upper quadrant, bandaged, deferred on evaluation of exit site SKIN: No rashes or lesions EXT: No significant edema  A 1. ESRD on PD, with CCKA; CCPD 2.5L x5, dry day no pause 2. Acute ischemic L CVA, in CIR 3. Anemia, not at goal, could benefit from iron 4. CKD-BMD, on C3, PhosLo; Hyperphosphatemia 5. DM2, neuropathy 6. COPD  P  Cont CCPD, all 1.5% destrose, has successful UF  COnt binders, C3  Venofer load, cont epogen . Medication Issues; o Preferred narcotic agents for pain control are hydromorphone, fentanyl, and methadone. Morphine should not be used.  o Baclofen should be avoided o Avoid oral sodium phosphate and magnesium citrate based laxatives / bowel preps    Pearson Grippe MD 03/22/2019, 11:26 AM  Recent Labs  Lab 03/16/19 1443 03/20/19 0629 03/21/19 0946  NA 141 141 142  K 3.5 3.0* 3.3*  CL 102 100 99  CO2 24 26 28   GLUCOSE 73 207* 184*  BUN 50* 44* 39*  CREATININE 9.22* 8.63*  8.75*  CALCIUM 8.1* 8.6* 8.9  PHOS 9.5*  --   --    Recent Labs  Lab 03/20/19 0629  WBC 6.4  HGB 8.1*  HCT 24.0*  MCV 98.8  PLT 195

## 2019-03-22 NOTE — Progress Notes (Signed)
Around 0945 patient very drowsy/lethargic. Use cold washcloth and sternal rub. Patient awakens for 5-10 seconds then falls back asleep. Patient will follow commands for grips and movement to all 4 extremities when awaken. Patient also able to state name, year and month when asked. Unable to get patient awake enough to take any oral medications or speak more than a word or two. Vital signs stable. Blood sugar 195. On call provider notified. Will let patient rest and attempt medications again at later time.

## 2019-03-22 NOTE — Progress Notes (Signed)
Patient awake and alert enough to take morning medications at 1156. Patient alert and oriented x4, following all commands-much more interactive.

## 2019-03-22 NOTE — Plan of Care (Signed)
  Problem: Consults Goal: RH STROKE PATIENT EDUCATION Description: See Patient Education module for education specifics  Outcome: Progressing Goal: Nutrition Consult-if indicated Outcome: Progressing Goal: Diabetes Guidelines if Diabetic/Glucose > 140 Description: If diabetic or lab glucose is > 140 mg/dl - Initiate Diabetes/Hyperglycemia Guidelines & Document Interventions  Outcome: Progressing   Problem: RH BOWEL ELIMINATION Goal: RH STG MANAGE BOWEL WITH ASSISTANCE Description: STG Manage Bowel with mod Assistance. Outcome: Progressing Goal: RH STG MANAGE BOWEL W/MEDICATION W/ASSISTANCE Description: STG Manage Bowel with Medication with mod Assistance. Outcome: Progressing   Problem: RH SKIN INTEGRITY Goal: RH STG SKIN FREE OF INFECTION/BREAKDOWN Description: Skin to remain free from infection and breakdown while on rehab with min assist. Outcome: Progressing Goal: RH STG MAINTAIN SKIN INTEGRITY WITH ASSISTANCE Description: STG Maintain Skin Integrity With min Assistance. Outcome: Progressing Goal: RH STG ABLE TO PERFORM INCISION/WOUND CARE W/ASSISTANCE Description: STG Able To Perform Wound Care With mod Assistance. Outcome: Progressing   Problem: RH SAFETY Goal: RH STG ADHERE TO SAFETY PRECAUTIONS W/ASSISTANCE/DEVICE Description: STG Adhere to Safety Precautions With mod Assistance and appropriate assistive Device. Outcome: Progressing   Problem: RH PAIN MANAGEMENT Goal: RH STG PAIN MANAGED AT OR BELOW PT'S PAIN GOAL Description: <3 on a 0-10 pain scale. Outcome: Progressing   Problem: RH KNOWLEDGE DEFICIT Goal: RH STG INCREASE KNOWLEDGE OF DIABETES Description: Patient will increase knowledge on DM medications, dietary restrictions, and follow-up care with the MD after discharge with min assist from staff. Outcome: Progressing Goal: RH STG INCREASE KNOWLEDGE OF HYPERTENSION Description: Patient will increase knowledge of HTN medications, dietary restrictions, and  follow-up care with the MD after discharge with min assist from staff. Outcome: Progressing Goal: RH STG INCREASE KNOWLEDGE OF DYSPHAGIA/FLUID INTAKE Description: Patient will increase and display knowledge of dysphagia diets and thickened liquids appropriate for his dietary restrictions with min assist from staff. Outcome: Progressing Goal: RH STG INCREASE KNOWLEGDE OF HYPERLIPIDEMIA Description: Patient will increase knowledge of HLD medications and follow-up care with the MD after discharge with min assist from staff. Outcome: Progressing Goal: RH STG INCREASE KNOWLEDGE OF STROKE PROPHYLAXIS Description: Patient will increase knowledge of medications used to prevent future strokes and follow-up care with the MD after discharge with min assist from staff. Outcome: Progressing

## 2019-03-23 ENCOUNTER — Inpatient Hospital Stay (HOSPITAL_COMMUNITY): Payer: Medicare PPO | Admitting: Occupational Therapy

## 2019-03-23 ENCOUNTER — Inpatient Hospital Stay (HOSPITAL_COMMUNITY): Payer: Medicare PPO | Admitting: Physical Therapy

## 2019-03-23 ENCOUNTER — Inpatient Hospital Stay (HOSPITAL_COMMUNITY): Payer: Medicare PPO

## 2019-03-23 LAB — CBC
HCT: 24.7 % — ABNORMAL LOW (ref 39.0–52.0)
Hemoglobin: 7.9 g/dL — ABNORMAL LOW (ref 13.0–17.0)
MCH: 32.8 pg (ref 26.0–34.0)
MCHC: 32 g/dL (ref 30.0–36.0)
MCV: 102.5 fL — ABNORMAL HIGH (ref 80.0–100.0)
Platelets: 228 10*3/uL (ref 150–400)
RBC: 2.41 MIL/uL — ABNORMAL LOW (ref 4.22–5.81)
RDW: 15.8 % — ABNORMAL HIGH (ref 11.5–15.5)
WBC: 8 10*3/uL (ref 4.0–10.5)
nRBC: 0 % (ref 0.0–0.2)

## 2019-03-23 LAB — URINE CULTURE: Culture: NO GROWTH

## 2019-03-23 LAB — GLUCOSE, CAPILLARY
Glucose-Capillary: 217 mg/dL — ABNORMAL HIGH (ref 70–99)
Glucose-Capillary: 295 mg/dL — ABNORMAL HIGH (ref 70–99)
Glucose-Capillary: 197 mg/dL — ABNORMAL HIGH (ref 70–99)
Glucose-Capillary: 238 mg/dL — ABNORMAL HIGH (ref 70–99)

## 2019-03-23 LAB — COMPREHENSIVE METABOLIC PANEL
ALT: 13 U/L (ref 0–44)
AST: 14 U/L — ABNORMAL LOW (ref 15–41)
Albumin: 1.9 g/dL — ABNORMAL LOW (ref 3.5–5.0)
Alkaline Phosphatase: 72 U/L (ref 38–126)
Anion gap: 16 — ABNORMAL HIGH (ref 5–15)
BUN: 37 mg/dL — ABNORMAL HIGH (ref 8–23)
CO2: 27 mmol/L (ref 22–32)
Calcium: 8.6 mg/dL — ABNORMAL LOW (ref 8.9–10.3)
Chloride: 99 mmol/L (ref 98–111)
Creatinine, Ser: 8.94 mg/dL — ABNORMAL HIGH (ref 0.61–1.24)
GFR calc Af Amer: 6 mL/min — ABNORMAL LOW (ref >60)
GFR calc non Af Amer: 5 mL/min — ABNORMAL LOW (ref >60)
Glucose, Bld: 236 mg/dL — ABNORMAL HIGH (ref 70–99)
Potassium: 3 mmol/L — ABNORMAL LOW (ref 3.5–5.1)
Sodium: 142 mmol/L (ref 135–145)
Total Bilirubin: <0.1 mg/dL — ABNORMAL LOW (ref 0.3–1.2)
Total Protein: 5.2 g/dL — ABNORMAL LOW (ref 6.5–8.1)

## 2019-03-23 MED ORDER — LEVETIRACETAM 100 MG/ML PO SOLN
500.0000 mg | Freq: Two times a day (BID) | ORAL | Status: DC
  Administered 2019-03-23 – 2019-04-04 (×23): 500 mg via ORAL
  Filled 2019-03-23 (×24): qty 5

## 2019-03-23 MED ORDER — POTASSIUM CHLORIDE CRYS ER 20 MEQ PO TBCR
40.0000 meq | EXTENDED_RELEASE_TABLET | Freq: Four times a day (QID) | ORAL | Status: AC
  Administered 2019-03-23 (×2): 40 meq via ORAL
  Filled 2019-03-23 (×2): qty 2

## 2019-03-23 MED ORDER — DARBEPOETIN ALFA 25 MCG/0.42ML IJ SOSY
25.0000 ug | PREFILLED_SYRINGE | INTRAMUSCULAR | Status: DC
  Administered 2019-03-29: 25 ug via SUBCUTANEOUS
  Filled 2019-03-23: qty 0.42

## 2019-03-23 MED ORDER — INSULIN GLARGINE 100 UNIT/ML ~~LOC~~ SOLN
30.0000 [IU] | Freq: Every day | SUBCUTANEOUS | Status: DC
  Administered 2019-03-24 – 2019-04-04 (×11): 30 [IU] via SUBCUTANEOUS
  Filled 2019-03-23 (×13): qty 0.3

## 2019-03-23 NOTE — Progress Notes (Signed)
[place] PHYSICAL MEDICINE & REHABILITATION PROGRESS NOTE   Subjective/Complaints:  Large BM today.  Finished ADL with OT, pt oreinted to person and place, not time   ROS: Patient deniesnausea, vomiting, diarrhea, cough, shortness of breath or chest pain, joint or back pain, headache, or mood change.    Objective:   No results found. Recent Labs    03/23/19 0707  WBC 8.0  HGB 7.9*  HCT 24.7*  PLT 228   Recent Labs    03/21/19 0946 03/23/19 0707  NA 142 142  K 3.3* 3.0*  CL 99 99  CO2 28 27  GLUCOSE 184* 236*  BUN 39* 37*  CREATININE 8.75* 8.94*  CALCIUM 8.9 8.6*    Intake/Output Summary (Last 24 hours) at 03/23/2019 0852 Last data filed at 03/22/2019 1833 Gross per 24 hour  Intake 240 ml  Output 350 ml  Net -110 ml     Physical Exam: Vital Signs Blood pressure 106/61, pulse 65, temperature 98.6 F (37 C), resp. rate 18, height 6' (1.829 m), weight 92.1 kg, SpO2 100 %. Constitutional: No distress . Vital signs reviewed. HEENT: EOMI, oral membranes moist Neck: supple Cardiovascular: RRR without murmur. No JVD    Respiratory: CTA Bilaterally without wheezes or rales. Normal effort    GI: BS +, non-tender, non-distended   Musculoskeletal:  General: Normal range of motion.  Cervical back: Normal range of motion.  Neurological:awake, a little lethargic. Followed basic commands. Normal language. Speech dysarthric. Left CN III palsy with ptosis and decreased medial deviation of left eye. Poor depth perception Mild right HP. No focal sensory deficits.  Skin: Skin iswarm.  Psychiatric: flat    Assessment/Plan: 1. 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  Physiatrist is providing close team supervision and 24 hour management of active medical problems listed below.  Physiatrist and rehab team continue to assess barriers  to discharge/monitor patient progress toward functional and medical goals  Care Tool:  Bathing    Body parts bathed by patient: Right arm, Left arm, Chest, Abdomen, Front perineal area, Right upper leg, Left upper leg, Face   Body parts bathed by helper: Buttocks, Face     Bathing assist Assist Level: Total Assistance - Patient < 25%     Upper Body Dressing/Undressing Upper body dressing   What is the patient wearing?: Pull over shirt    Upper body assist Assist Level: Moderate Assistance - Patient 50 - 74%    Lower Body Dressing/Undressing Lower body dressing      What is the patient wearing?: Incontinence brief, Pants     Lower body assist Assist for lower body dressing: Maximal Assistance - Patient 25 - 49%     Toileting Toileting Toileting Activity did not occur (Clothing management and hygiene only): N/A (no void or bm)  Toileting assist Assist for toileting: Maximal Assistance - Patient 25 - 49%     Transfers Chair/bed transfer  Transfers assist  Chair/bed transfer activity did not occur: Safety/medical concerns  Chair/bed transfer assist level: Maximal Assistance - Patient 25 - 49%     Locomotion Ambulation   Ambulation assist      Assist level: Maximal Assistance - Patient 25 - 49% Assistive device: No Device Max distance: 25   Walk 10 feet activity   Assist     Assist level: Maximal Assistance - Patient 25 - 49% Assistive device: No Device   Walk  50 feet activity   Assist Walk 50 feet with 2 turns activity did not occur: Safety/medical concerns         Walk 150 feet activity   Assist Walk 150 feet activity did not occur: Safety/medical concerns         Walk 10 feet on uneven surface  activity   Assist           Wheelchair     Assist   Type of Wheelchair: Manual    Wheelchair assist level: Minimal Assistance - Patient > 75% Max wheelchair distance: 147ft    Wheelchair 50 feet with 2 turns  activity    Assist        Assist Level: Minimal Assistance - Patient > 75%   Wheelchair 150 feet activity     Assist  Wheelchair 150 feet activity did not occur: Safety/medical concerns       Blood pressure 106/61, pulse 65, temperature 98.6 F (37 C), resp. rate 18, height 6' (1.829 m), weight 92.1 kg, SpO2 100 %.  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 Antithrombotics: -DVT/anticoagulation:Pharmaceutical:Heparin -antiplatelet therapy: ASA/PLAVIX X 3 weeks followed by ASA alone. 3. Pain Management:tylenol prn 4. Mood:LCSW to follow for evaluation and support. -antipsychotic agents: N/A 5. Neuropsych: This patientiscapable of making decisions on hisown behalf. 6. Skin/Wound Care:Routine pressure relief measures. 7. Fluids/Electrolytes/Nutrition:Monitor I/O. Has been refusing nectar liquids. Push PO 8. T2DM with neuropathy/nephropathy: Hgb A1c- Was on ozempic, Lantus, Humalog and glucotrol PTA.               1/23 sugars poorly controlled at present                         -resume lantus insulin, was on 20u daily   1/24-monitor sugars today with addition of lantus 9. ESRD- on PD at nights.On calcitriol and phoslo for 10. Hypokalemia/Hypomagnesemia: supps per nephro. - will defer to Nephro fo rrecs  11. Anemia of chronic disease: On aranesp weekly. Hgb 7.9- stable  12. Chronic diarrhea:Continue creon tid with meals.   -no diarrhea currently 14.HTN: Monitor BP tid--on metoprolol, losartan and low dose amlodipine             1/24 fair control 15. Chronic dizziness/Orthostatic changes?:  no dizziness reported   -will check orthostatic VS x 2  days 16. Prolonged QTC: QTC 528 on EKG 1/14 17. Lethargy: intermittent  -may be due to midbrain infarct  -elevated CBG's not helping either    -check CMET, CBC look stable no leukocytosis, K+ still low , ask renal to eval   -check ua,ucx today as he is making some urine- Urine with elevated protein but otherwise no sign of infection     LOS: 3 days A FACE TO FACE EVALUATION WAS PERFORMED  [name] 03/23/2019, 8:52 AM

## 2019-03-23 NOTE — Care Management (Signed)
Patient Details  Name: Kyle [name] MRN: [id number] Date of Birth: 07-02-1950  Today's Date: 03/23/2019  Problem List:  Patient Active Problem List   Diagnosis Date Noted  . 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[place]) 03/20/2019  . Generalized weakness   . Diabetic polyneuropathy associated with type 2 diabetes mellitus ([place])   . Cerebrovascular accident (CVA) ([place])   . PVD (peripheral vascular disease) ([place]) 03/17/2019  . Ptosis of eyelid, left 03/15/2019  . Physical deconditioning 03/14/2019  . Hypomagnesemia 03/13/2019  . Hyperphosphatemia 03/13/2019  . ESRD (end stage renal disease) ([place]) 03/13/2019  . Chronic kidney disease with peritoneal dialysis as preferred modality, stage 5 ([place]) 03/13/2019  . Prolonged QT interval 03/13/2019  . Essential hypertension 03/13/2019  . Hypothyroidism 03/13/2019  . Chronic diarrhea 03/13/2019  . Hypocalcemia 03/12/2019  . Hypokalemia 09/16/2018  . Lung nodule 06/12/2016  . Narrowing of airway   . Cigarette smoker 05/24/2016  . Collapse of right lung 05/24/2016  . COPD with chronic bronchitis ([place]) 05/24/2016  . Acne 05/20/2015  . Calculus of kidney 05/20/2015  . Chest pain, non-cardiac 05/20/2015  . Seizure ([place]) 05/20/2015  . Current tobacco use 05/20/2015  . Chronic kidney disease (CKD), stage III (moderate) 03/29/2015  . Type 2 diabetes mellitus with hyperlipidemia ([place]) 03/15/2015  . Lipoma of shoulder 03/08/2014  . Other synovitis and tenosynovitis, right shoulder 03/08/2014  . Bursitis of elbow 02/15/2014  . Absolute anemia 08/08/2013  . 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 Back pain, chronic 08/08/2013  . Chronic obstructive pulmonary disease ([place]) 08/08/2013  . Diabetes mellitus ([place]) 08/08/2013  . BP (high blood pressure) 08/08/2013  . HLD (hyperlipidemia) 08/08/2013  . Acne erythematosa 08/08/2013   Past Medical History:  Past Medical History:  Diagnosis Date  . Anxiety   . BPH (benign prostatic hyperplasia)   . Chronic  kidney disease   . Colon polyps   . COPD (chronic obstructive pulmonary disease) ([place])   . Depression   . Diabetes mellitus without complication ([place])   . Diverticulosis of colon   . Hyperlipidemia   . Hypertension   . Hypothyroidism   . Nephrolithiasis   . Seizures ([place])    Past Surgical History:  Past Surgical History:  Procedure Laterality Date  . COLONOSCOPY    . COLONOSCOPY WITH PROPOFOL N/A 05/30/2015   Procedure: COLONOSCOPY WITH PROPOFOL;  Surgeon: Manya Silvas, MD;  Location: [facility] ENDOSCOPY;  Service: Endoscopy;  Laterality: N/A;  . COLONOSCOPY WITH PROPOFOL N/A 12/01/2018   Procedure: COLONOSCOPY WITH PROPOFOL;  Surgeon: Toledo, [name], MD;  Location: [facility] ENDOSCOPY;  Service: Gastroenterology;  Laterality: N/A;  . kidney stone    . THYROIDECTOMY    . VARICOCELE EXCISION    . VIDEO BRONCHOSCOPY Bilateral 05/25/2016   Procedure: VIDEO BRONCHOSCOPY WITHOUT FLUORO;  Surgeon: Juanito Doom, MD;  Location: [facility] ENDOSCOPY;  Service: Cardiopulmonary;  Laterality: Bilateral;   Social History:  reports that he has been smoking cigarettes. He has a 40.00 pack-year smoking history. He has never used smokeless tobacco. He reports current alcohol use of about 8.0 standard drinks of alcohol per week. He reports that he does not use drugs.  Family / Support Systems Marital Status: Married Patient Roles: Spouse Spouse/Significant Other: Jarold Song Anticipated Caregiver: wife Ability/Limitations of Caregiver: Minimal assist Caregiver Availability: 24/7  Social History Preferred language: English Religion: Protestant Read: Yes Write: Yes Employment Status: Retired   Abuse/Neglect Abuse/Neglect Assessment Can Be Completed: Yes Physical Abuse: Denies Verbal Abuse: Denies Sexual Abuse:  Denies Exploitation of patient/patient's resources: Denies Self-Neglect: Denies  Emotional Status Pt's affect, behavior and adjustment status: Flat affec,t left eye ptosis Recent  Psychosocial Issues: Anxiety, smoker  Patient / Family Perceptions, Expectations & Goals Pt/Family understanding of illness & functional limitations: Patient appears to have a fair understanding of current illness and functional limitations. Has been impulsive and currently has a Corporate investment banker for safety. Premorbid pt/family roles/activities: Sedentary prior to admission, ambulated short distances however usually stayed in the bed most of the day. Independent with CCPD and DM management PTA Anticipated changes in roles/activities/participation: Wife will need to provide assistance with CCPD, CBG monitoring and driving Pt/family expectations/goals: Back to usual routine, as independent as possible  Occupational psychologist available at discharge: Wife will provide transportation at discharge  Discharge Planning Living Arrangements: Spouse/significant other [place]: Spouse/significant other Type of Residence: Private residence Does the patient have any problems obtaining your medications?: No Home Management: Wife will manage the home Patient/Family Preliminary Plans: Home with wife who will provide 24/7 care [place] Barriers to Discharge: Decreased caregiver support, Lack of/limited family support, Hemodialysis [place] Barriers to Discharge Comments: Patient completed PD independently prior to admission, wife will need to assist at discharge Social Work Anticipated Follow Up Needs: HH/OP Expected length of stay: 2-2.5 weeks  Clinical Impression The patient is drowsy and reported he did not sleep well last night. Flat affect , left eye ptosis "from the stroke", blunted conversation. Reported he understands projected LOS and would like to go home sooner however " it is what it is". Appears to have a fair understanding of his current health situation and functional limitations and is willing to work on them so he can get back home.  Dorien Chihuahua B 03/23/2019, 9:01 AM

## 2019-03-23 NOTE — Progress Notes (Addendum)
Physical Therapy Session Note  Patient Details  Name: Kyle [name] MRN: [id number] Date of Birth: 1950/12/21  Today's Date: 03/23/2019 PT Individual Time: 1100-1154 PT Individual Time Calculation (min): 54 min   Short Term Goals: Week 1:  PT Short Term Goal 1 (Week 1): Pt will transfer to [facility] with min assist consistently PT Short Term Goal 2 (Week 1): Pt will ambulate 69f with mod assist and LRAD PT Short Term Goal 3 (Week 1): Pt will propel WC 1080fwith supevision assist PT Short Term Goal 4 (Week 1): Pt will perform bed mobility with min assist  Skilled Therapeutic Interventions/Progress Updates: Pt presented in recliner sleeping and requiring extensive simulation to arouse. PTA took increased time to including sternal rubs, wet washcloth and auditory stimulation to arouse pt. Pt then when initially aroused had difficulty remaining awake and would close eyes almost immediately. Once pt awake was orientated to place, name, and situation. Pt then participated in seated LE therex including ankle pumps, LAQ, and hip flexion. Pt required mod/max cues for technique and using controlled movement. Pt noted to intermittently fall asleep in the middle of reps, but was easily awakened. Pt then performed STS with RW from recliner for BLE strengthening. Pt performed x 5 with modA with verbal cues for hand placement with pt demonstrating fair carryover. Pt also attempted toe taps to 2 in step x 10 bilaterally. When performing with LLE required max multimodal cues to maintain R knee extension and to improve posture due to increasing R lean. Pt was able to correct somewhat however required modA from PTA for correction. Pt request to remain in recliner at end of session and left with belt alarm on, call bell within reach and needs met.      Therapy Documentation Precautions:  Precautions Precautions: Fall Restrictions Weight Bearing Restrictions: No General:   Vital Signs: Therapy Vitals Temp:  97.6 F (36.4 C) Temp Source: Oral Pulse Rate: 60 Resp: 18 BP: (!) 154/64 Patient Position (if appropriate): Lying Oxygen Therapy SpO2: 100 % O2 Device: Room Air Pain: Pain Assessment Pain Score: 0-No pain    Therapy/Group: Individual Therapy  Michaelangelo Mittelman  Madoline Bhatt, PTA  03/23/2019, 12:58 PM

## 2019-03-23 NOTE — Progress Notes (Signed)
[facility] ROUNDING NOTE   Subjective:   This is a 69 year old gentleman with a history of diabetes mellitus type 2 end-stage renal disease on peritoneal dialysis seizure disorder COPD morbid obesity gait disorder with falls chronic diarrhea admitted to [facility] on 03/12/2019 with a history of significant hypercalcemia confusion and weakness.  And having missed dialysis for 3 days.  MRI of the brain for left-sided ptosis revealed an acute left paramedian pons and old infarcts in the left paramedian pons chronic  small vessel disease.  Ejection fraction 65 to 85% grade 1 diastolic dysfunction carotid Dopplers negative for ICA stenosis.  He continues on peritoneal dialysis.  He is using all 1.5% dextrose with successful ultrafiltration  Blood pressure 106/61 pulse 85 temperature 98.6 O2 sats 1% room air  Sodium 142 potassium 3 chloride 99 CO2 27 BUN 37 creatinine 8.94 glucose 236 calcium 8.6 albumin 1.9 AST 14 ALT 13 WBC 8 hemoglobin 7.9 platelets 228 Iron saturation 20%  Amlodipine 2.5 mg daily, aspirin 81 mg daily Lipitor 80 mg daily calcitriol 0.5 mcg daily PhosLo 1.3 g 3 times daily, Plavix 75 mg daily, Epogen 10,000 units weekly administered 03/22/2019 gabapentin 100 mg twice daily, Lantus 20 units daily Keppra 500 mg twice daily Synthroid 200 mcg daily Creon 24,000 units 3 times daily, metoprolol 50 mg daily multivitamins 1 daily   Objective:  Vital signs in last 24 hours:  Temp:  [97.6 F (36.4 C)-98.6 F (37 C)] 98.6 F (37 C) (01/25 0620) Pulse Rate:  [57-74] 65 (01/25 0623) Resp:  [16-20] 18 (01/25 0620) BP: (106-142)/(60-69) 106/61 (01/25 0623) SpO2:  [97 %-100 %] 100 % (01/25 0623) Weight:  [92.1 kg] 92.1 kg (01/24 0917)  Weight change: -0.8 kg Filed Weights   03/20/19 1738 03/21/19 1841 03/22/19 0917  Weight: 95.4 kg 92.9 kg 92.1 kg    Intake/Output: I/O last 3 completed shifts: In: 750 [P.O.:750] Out: 350 [Urine:350]    Intake/Output this shift:  Total I/O In: 320 [P.O.:320] Out: -   IDP:OEUMPNTIRWE ill, NAD RXV:QMGQ eye ptosis EYES:EOMI QP:YPPJKDT, normal S1 and S2 PULM:Clear bilaterally, normal work of breathing, OIZ:TIWP, nontender. Bowel sounds present. PD catheter in left upper quadrant, bandaged, deferred on evaluation of exit site SKIN:No rashes or lesions EXT:No significant edema   Basic Metabolic Panel: Recent Labs  Lab 03/16/19 1443 03/16/19 1443 03/20/19 0629 03/21/19 0946 03/23/19 0707  NA 141  --  141 142 142  K 3.5  --  3.0* 3.3* 3.0*  CL 102  --  100 99 99  CO2 24  --  26 28 27   GLUCOSE 73  --  207* 184* 236*  BUN 50*  --  44* 39* 37*  CREATININE 9.22*  --  8.63* 8.75* 8.94*  CALCIUM 8.1*   < > 8.6* 8.9 8.6*  MG  --   --  1.5* 1.8  --   PHOS 9.5*  --   --   --   --    < > = values in this interval not displayed.    Liver Function Tests: Recent Labs  Lab 03/16/19 1443 03/21/19 0946 03/23/19 0707  AST  --  16 14*  ALT  --  11 13  ALKPHOS  --  79 72  BILITOT  --  0.7 <0.1*  PROT  --  5.1* 5.2*  ALBUMIN 2.4* 2.0* 1.9*   No results for input(s): LIPASE, AMYLASE in the last 168 hours. No results for input(s): AMMONIA in the last 168  hours.  CBC: Recent Labs  Lab 03/20/19 0629 03/23/19 0707  WBC 6.4 8.0  HGB 8.1* 7.9*  HCT 24.0* 24.7*  MCV 98.8 102.5*  PLT 195 228    Cardiac Enzymes: No results for input(s): CKTOTAL, CKMB, CKMBINDEX, TROPONINI in the last 168 hours.  BNP: Invalid input(s): POCBNP  CBG: Recent Labs  Lab 03/22/19 1003 03/22/19 1128 03/22/19 1648 03/22/19 2107 03/23/19 0615  GLUCAP 195* 154* 236* 181* 217*    Microbiology: Results for orders placed or performed during the hospital encounter of 03/12/19  SARS CORONAVIRUS 2 (TAT 6-24 HRS) Nasopharyngeal Nasopharyngeal Swab     Status: None   Collection Time: 03/12/19  9:31 PM   Specimen: Nasopharyngeal Swab  Result Value Ref Range Status   SARS Coronavirus 2  NEGATIVE NEGATIVE Final    Comment: (NOTE) SARS-CoV-2 target nucleic acids are NOT DETECTED. The SARS-CoV-2 RNA is generally detectable in upper and lower respiratory specimens during the acute phase of infection. Negative results do not preclude SARS-CoV-2 infection, do not rule out co-infections with other pathogens, and should not be used as the sole basis for treatment or other patient management decisions. Negative results must be combined with clinical observations, patient history, and epidemiological information. The expected result is Negative. Fact Sheet for Patients: SugarRoll.be Fact Sheet for Healthcare Providers: https://www.woods-mathews.com/ This test is not yet approved or cleared by the [place] FDA and  has been authorized for detection and/or diagnosis of SARS-CoV-2 by FDA under an Emergency Use Authorization (EUA). This EUA will remain  in effect (meaning this test can be used) for the duration of the COVID-19 declaration under Section 56 4(b)(1) of the Act, 21 U.S.C. section 360bbb-3(b)(1), unless the authorization is terminated or revoked sooner. Performed at [facility], [place] [address], [place], [place] [address]     Coagulation Studies: No results for input(s): LABPROT, INR in the last 72 hours.  Urinalysis: Recent Labs    03/22/19 1622  COLORURINE YELLOW  LABSPEC 1.018  PHURINE 5.0  GLUCOSEU >=500*  HGBUR NEGATIVE  BILIRUBINUR NEGATIVE  KETONESUR NEGATIVE  PROTEINUR >=300*  NITRITE NEGATIVE  LEUKOCYTESUR NEGATIVE      Imaging: No results found.   Medications:   . dialysis solution 1.5% low-MG/low-CA    . dialysis solution 1.5% low-MG/low-CA    . ferric gluconate (FERRLECIT/NULECIT) IV Stopped (03/22/19 1434)   . amLODipine  2.5 mg Oral Daily  . aspirin EC  81 mg Oral Daily  . atorvastatin  80 mg Oral Daily  . calcitRIOL  0.5 mcg Oral Daily  . calcium acetate  1,334 mg Oral TID WC   . calcium carbonate  1,250 mg Oral TID WC  . clopidogrel  75 mg Oral Daily  . epoetin (EPOGEN/PROCRIT) injection  10,000 Units Subcutaneous Weekly  . feeding supplement (NEPRO CARB STEADY)  237 mL Oral BID BM  . gabapentin  100 mg Oral BID  . gentamicin cream  1 application Topical Daily  . heparin  5,000 Units Subcutaneous Q8H  . insulin aspart  0-15 Units Subcutaneous TID WC  . insulin glargine  20 Units Subcutaneous Daily  . levETIRAcetam  500 mg Oral BID  . levothyroxine  200 mcg Oral q morning - 10a  . lipase/protease/amylase  24,000 Units Oral TID WC  . losartan  100 mg Oral Daily  . metoprolol succinate  50 mg Oral Daily  . multivitamin  1 tablet Oral QHS   acetaminophen, bisacodyl, guaiFENesin-dextromethorphan, dianeal solution for CAPD/CCPD with heparin, polyethylene  glycol, prochlorperazine **OR** prochlorperazine **OR** prochlorperazine, Resource ThickenUp Clear  Assessment/ Plan:   ESRD-peritoneal dialysis diabetic nephropathy uses CCPD 2.5 L with 5 exchanges no pauses.  Continues on 1.5% bags with good ultrafiltration  Anemia iron saturation 20% we will give IV Ferrlecit and continue darbepoetin will change from Epogen to darbepoetin  Bone mineral continue binders vitamin D.  Will discontinue calcium carbonate  Chronic pancreatitis continues on pancreatic enzyme supplementation  Status post pontine stroke continues on Plavix and aspirin  Hypertension/volume continues to ultrafilter well.  Will discontinue losartan as blood pressure appears under excellent control will continue to follow  Diabetes mellitus as per primary team  Hypothyroidism on replacement therapy    LOS: [place] @TODAY @9 :10 AM

## 2019-03-23 NOTE — Progress Notes (Signed)
Speech Language Pathology Daily Session Note  Patient Details  Name: Kyle [name] MRN: [id number] Date of Birth: 09/23/1950  Today's Date: 03/23/2019 SLP Individual Time: 1345-1445 SLP Individual Time Calculation (min): 60 min  Short Term Goals: Week 1: SLP Short Term Goal 1 (Week 1): Pt wil consume dys 3 textures and thin liquids with minimal overt s/s aspiration and min A verbal cues for use of swallow startegies ( second swallow.) SLP Short Term Goal 2 (Week 1): Pt will demonstrate use of speech intelligibility strategies (over articulation and slow rate) at sentence level for 90% intelligibility with min A verbal cues. SLP Short Term Goal 3 (Week 1): Pt will demonstrate sustained attention in 15 minute intervals with min A verbal cues for redirection during functional tasks. SLP Short Term Goal 4 (Week 1): Pt will demonstrate intellectual awareness of 2 cognitive/swallow/speech deficits and 2 physical deficits with mod A verbal cues. SLP Short Term Goal 5 (Week 1): Pt will complete functional and basic problem solving tasks with min A verbal cues. SLP Short Term Goal 6 (Week 1): Pt will demonstrate recall of novel and daily information with min A verbal cues for compensatory startegies.  Skilled Therapeutic Interventions: Skilled ST services focused on swallow and cognitive skills. Pt demonstrated recall of swallow strategy, however required education on rational which was provided. Pt demonstrated increase in insight stating 2 swallow/cognitive deficits and 2 physical deficits with mod A verbal cues. Pt required min A verbal cues to utilize dry swallow when consuming 4oz thin via straw. SLP facilitated basic problem solving skills in (ALFA) basic money management task, pt required max A verbal cues, however appeared impacted by visual deficits and organization. Pt was able to provide correct calculations of adding and subtracting change given mental math questions and answered 7 out 10  verbal semi-complex problem solving questions on ALFA. Pt demonstrated basic problem solving skills in card sequencing task with 4-5 steps requiring min A fade to supervision A verbal cues for errors. Pt demonstrated sustained attention in 20 minute intervals with mod A verbal cues and in the last 15 minutes of session required increase cuing due to lethargy. Pt demonstrated an overall increase of skill level compared to on evaluation, however SLP will wait to adjust goals to ensure skill level is consistent. Pt was left in room with call bell within reach and chair alarm set. ST recommends to continue skilled ST services.      Pain Pain Assessment Pain Score: 0-No pain  Therapy/Group: Individual Therapy  Taqwa Deem  [facility] 03/23/2019, 3:06 PM

## 2019-03-23 NOTE — Progress Notes (Signed)
Inpatient Rehabilitation  Patient information reviewed and entered into eRehab system by Marissa Lowrey M. Adelei Scobey, M.A., CCC/SLP, PPS Coordinator.  Information including medical coding, functional ability and quality indicators will be reviewed and updated through discharge.    

## 2019-03-23 NOTE — Progress Notes (Signed)
Occupational Therapy Session Note  Patient Details  Name: Kyle [name] MRN: [id number] Date of Birth: 05-29-1950  Today's Date: 03/23/2019 OT Individual Time: 5885-0277 OT Individual Time Calculation (min): 55 min   Short Term Goals: Week 1:  OT Short Term Goal 1 (Week 1): Pt will complete toilet transfer with 1 assist and LRAD OT Short Term Goal 2 (Week 1): Pt will don overhead shirt with Min A OT Short Term Goal 3 (Week 1): Pt will complete LB self care with Mod A for dynamic standing balance  Skilled Therapeutic Interventions/Progress Updates:    Pt greeted in bed, finishing up breakfast with NT present to supervise. Pt reported he needed to use the bathroom, specifically the toilet and initiated bed mobility getting up on Rt side. Educated pt that he was still hooked up to peritoneal dialysis on Lt side and therefore he needed to use the [facility]. Supine<sit completed with supervision assist. Mod A for stand pivot<BSC using RW. Pt with liquid BM void. He stood multiple times to complete his own hygiene with Mod A for dynamic balance 75% of the time due to Rt and posterior lean, balance assist increased to Max A when fatigue set in, pt with poor awareness. He needed Min A for thoroughness before OT applied brief. Stand pivot<bed completed with Mod A using device once again. Dialysis tech arrived to disconnect pt and then he completed dressing tasks sit<stand from EOB using RW. Max A for threading LEs into pants due to poor trunk control and not following education for hemi techniques. He required Mod A for dynamic balance while pulling pants up. Min A for sitting balance while donning overhead shirt. Stand pivot<recliner completed using device. Min A for handwashing using sanitizer and grooming tasks during session as well. Pt remained comfortably in recliner at end of session with all needs within reach and safety belt fastened. Tx focus placed on postural control, sit<stands, standing balance,  ADL retraining, and Rt NMR.   Therapy Documentation Precautions:  Precautions Precautions: Fall Restrictions Weight Bearing Restrictions: No Vital Signs: Therapy Vitals Temp: 97.6 F (36.4 C) Temp Source: Oral Pulse Rate: 60 Resp: 18 BP: (!) 154/64 Patient Position (if appropriate): Lying Oxygen Therapy SpO2: 100 % O2 Device: Room Air Pain: No c/o pain during tx Pain Assessment Pain Score: 0-No pain ADL: ADL Eating: Not assessed Grooming: Maximal assistance Where Assessed-Grooming: Edge of bed Upper Body Bathing: Minimal assistance Where Assessed-Upper Body Bathing: Edge of bed Lower Body Bathing: Maximal assistance Where Assessed-Lower Body Bathing: Edge of bed Upper Body Dressing: Moderate assistance Where Assessed-Upper Body Dressing: Edge of bed Lower Body Dressing: Maximal assistance Where Assessed-Lower Body Dressing: Edge of bed Toileting: Not assessed Toilet Transfer: Not assessed Tub/Shower Transfer: Not assessed      Therapy/Group: Individual Therapy  Josua Ferrebee A Altheia Shafran 03/23/2019, 12:42 PM

## 2019-03-23 NOTE — IPOC Note (Signed)
Overall Plan of Care [facility]) Patient Details Name: Keymari Sato MRN: [id number] DOB: [birth year]  Admitting Diagnosis: Left pontine stroke [facility])  Hospital Problems: Principal Problem:   Left pontine stroke [facility])     Functional Problem List: Nursing    PT Balance, Behavior, Edema, Endurance, Motor, Nutrition, Perception, Pain, Safety, Sensory, Skin Integrity  OT Balance, Perception, Behavior, Safety, Cognition, Vision, Endurance, Motor  SLP    TR         Basic ADL's: OT Eating, Grooming, Bathing, Dressing, Toileting     Advanced  ADL's: OT Simple Meal Preparation     Transfers: PT Bed Mobility, Bed to Chair, Car, [employer], Floor  OT Toilet, Tub/Shower     Locomotion: PT Ambulation, Stairs, Wheelchair Mobility     Additional Impairments: OT Fuctional Use of Upper Extremity  SLP Swallowing, Communication, Social Cognition expression Problem Solving, Memory, Attention, Awareness  TR      Anticipated Outcomes Item Anticipated Outcome  Self Feeding Supervision/cuing  Swallowing  Supervision A   Basic self-care  Supervision/cuing  Toileting  Supervision/cuing   Bathroom Transfers Supervision/cuing-CGA  Bowel/Bladder     Transfers  CGA with LRAD  Locomotion  CGA with LRAD at ambulatory level at house hold distances.  Communication  Supervision A  Cognition  Min-Supervision A  Pain     Safety/Judgment      Therapy Plan: PT Intensity: Minimum of 1-2 x/day ,45 to 90 minutes PT Frequency: 5 out of 7 days PT Duration Estimated Length of Stay: 18-21 days OT Intensity: Minimum of 1-2 x/day, 45 to 90 minutes OT Frequency: 5 out of 7 days OT Duration/Estimated Length of Stay: 2 to 3 weeks SLP Intensity: Minumum of 1-2 x/day, 30 to 90 minutes SLP Frequency: 3 to 5 out of 7 days SLP Duration/Estimated Length of Stay: 10-14 days   Due to the current state of emergency, patients may not be receiving their 3-hours of Medicare-mandated therapy.   Team  Interventions: Nursing Interventions    PT interventions Ambulation/gait training, Discharge planning, Functional mobility training, Psychosocial support, Therapeutic Activities, Visual/perceptual remediation/compensation, Wheelchair propulsion/positioning, Therapeutic Exercise, Skin care/wound management, Neuromuscular re-education, Disease management/prevention, Training and development officer, Cognitive remediation/compensation, DME/adaptive equipment instruction, Pain management, UE/LE Strength taining/ROM, Splinting/orthotics, UE/LE Coordination activities, Stair training, Patient/family education, Functional electrical stimulation, Community reintegration  OT Interventions Training and development officer, Discharge planning, Pain management, Self Care/advanced ADL retraining, Therapeutic Activities, UE/LE Coordination activities, Visual/perceptual remediation/compensation, Therapeutic Exercise, Patient/family education, Functional mobility training, Disease mangement/prevention, Cognitive remediation/compensation, Academic librarian, Engineer, drilling, Neuromuscular re-education, Psychosocial support, UE/LE Strength taining/ROM, Wheelchair propulsion/positioning  SLP Interventions Cognitive remediation/compensation, English as a second language teacher, Dysphagia/aspiration precaution training, Functional tasks, Internal/external aids, Patient/family education  TR Interventions    SW/CM Interventions Discharge Planning, Psychosocial Support, Patient/Family Education   Barriers to Discharge MD  Medical stability  Nursing      PT Inaccessible home environment, Medical stability, Home environment access/layout, Wound Care, Insurance for SNF coverage    OT Medical stability, Lack of/limited family support, Behavior    SLP      SW       Team Discharge Planning: Destination: PT-Home ,OT- Home , SLP-Home Projected Follow-up: PT-Home health PT, OT-  Home health OT, SLP-Home Health SLP, 24 hour  supervision/assistance Projected Equipment Needs: PT-Wheelchair (measurements), Wheelchair cushion (measurements), OT- To be determined, SLP-None recommended by SLP Equipment Details: PT- , OT-  Patient/family involved in discharge planning: PT- Patient,  OT-Patient, SLP-Patient  MD ELOS: 14-18d Medical Rehab Prognosis:  Good Assessment:   69 year old male  with history of T2DM with neuropathy and nephropathy, 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 Wife reported having missed dialysis for 3 days? And patient with elevated phosphorous and anion gap. He was started on IV calcium and phosphate binders--did have a fall in ED while trying to get to the bathroom independently. CT head negative for acute changes. He developed left ptosis, decreased in ability to use RUE and PT evaluation revealed balance deficits with significant left lean on standing with poor safety awareness. 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 2D echo showed EF 65-70% with mild increase in left ventricular wall and grade 1 DD. Carotid dopplers was negative for significant ICA stenosis. Dr. Doy Mince felt that stroke was due to small vessel disease and recommended DAPT x 3 weeks followed by ASA alone. He continues to have bouts of confusion with hallucinations as well as electrolyte abnormalities. ST recommended downgrading diet to dysphagia 2, nectarsdue to mild to moderate oropharyngeal dysphagia.He continues to be limited decreased balance with ataxia, decreased awareness of deficits and impulsivity .   Now requiring 24/7 Rehab RN,MD, as well as CIR level PT, OT and SLP.  Treatment team will focus on ADLs and mobility with goals set at Supervision See Team Conference Notes for weekly  updates to the plan of care

## 2019-03-24 ENCOUNTER — Inpatient Hospital Stay (HOSPITAL_COMMUNITY): Payer: Medicare PPO | Admitting: Physical Therapy

## 2019-03-24 ENCOUNTER — Inpatient Hospital Stay (HOSPITAL_COMMUNITY): Payer: Medicare PPO | Admitting: Occupational Therapy

## 2019-03-24 ENCOUNTER — Inpatient Hospital Stay (HOSPITAL_COMMUNITY): Payer: Medicare PPO

## 2019-03-24 LAB — GLUCOSE, CAPILLARY
Glucose-Capillary: 154 mg/dL — ABNORMAL HIGH (ref 70–99)
Glucose-Capillary: 236 mg/dL — ABNORMAL HIGH (ref 70–99)
Glucose-Capillary: 230 mg/dL — ABNORMAL HIGH (ref 70–99)
Glucose-Capillary: 294 mg/dL — ABNORMAL HIGH (ref 70–99)

## 2019-03-24 MED ORDER — POTASSIUM CHLORIDE CRYS ER 20 MEQ PO TBCR
40.0000 meq | EXTENDED_RELEASE_TABLET | Freq: Three times a day (TID) | ORAL | Status: DC
  Administered 2019-03-24 – 2019-03-25 (×4): 40 meq via ORAL
  Filled 2019-03-24 (×4): qty 2

## 2019-03-24 MED ORDER — RENA-VITE PO TABS
1.0000 | ORAL_TABLET | Freq: Every day | ORAL | Status: DC
  Administered 2019-03-25 – 2019-04-04 (×10): 1 via ORAL
  Filled 2019-03-24 (×10): qty 1

## 2019-03-24 NOTE — Progress Notes (Signed)
Physical Therapy Session Note  Patient Details  Name: Kyle [name] MRN: [id number] Date of Birth: June 13, 1950  Today's Date: 03/24/2019 PT Individual Time: 1105-1205 PT Individual Time Calculation (min): 60 min   Short Term Goals: Week 1:  PT Short Term Goal 1 (Week 1): Pt will transfer to [facility] with min assist consistently PT Short Term Goal 2 (Week 1): Pt will ambulate 59f with mod assist and LRAD PT Short Term Goal 3 (Week 1): Pt will propel WC 1070fwith supevision assist PT Short Term Goal 4 (Week 1): Pt will perform bed mobility with min assist  Skilled Therapeutic Interventions/Progress Updates: Pt presented in bed asleep and was able to arouse within 5 min with audio and tactile stimulation. Pt denies pain but periods throughout session very lethargic. With increased time pt performed supine to sit with minA and use of bed features. Performed stand pivot transfer to w/c minA and max cues for hand placement. Pt transported to day room and participated in Cybex Kinetron 70cm/sec x 5 min for reciprocal activity, general conditioning and in an attempt to increase arousal in pt. Pt did tend to nod off x 1 during activity but PTA was able to arouse easily speaking to pt. Pt then transported to rehab gym and performed stand pivot transfer to mat minA with max verbal cues for safety with RW and sequencing. Participated in toe taps to target on floor (no step) 2 x 10 with mirror feedback with cues for midline orientation/decreasing R lateral lean. PTA intermittently required multimodal cues to improve R knee extension during activity. Pt also participated in x 2 bouts of horseshoes in standing with use of RUE. Pt returned to w/c via stand pivot with significantly improved safety and transported back to room. Pt then performed stand pivot in same manner to recliner. Pt left in recliner with call bell within reach, belt alarm on, and current needs met.      Therapy Documentation Precautions:   Precautions Precautions: Fall Restrictions Weight Bearing Restrictions: No    Therapy/Group: Individual Therapy  Charisma Charlot  Amitai Delaughter, PTA  03/24/2019, 12:48 PM

## 2019-03-24 NOTE — Progress Notes (Signed)
[place] PHYSICAL MEDICINE & REHABILITATION PROGRESS NOTE   Subjective/Complaints:  Elevated CBGs noted Pt coughing but no SOB, no issues overnite Appreciate Nephro note   ROS: Patient denies nausea, vomiting, diarrhea, cough, shortness of breath or chest pain, joint or back pain, headache, or mood change.    Objective:   No results found. Recent Labs    03/23/19 0707  WBC 8.0  HGB 7.9*  HCT 24.7*  PLT 228   Recent Labs    03/21/19 0946 03/23/19 0707  NA 142 142  K 3.3* 3.0*  CL 99 99  CO2 28 27  GLUCOSE 184* 236*  BUN 39* 37*  CREATININE 8.75* 8.94*  CALCIUM 8.9 8.6*    Intake/Output Summary (Last 24 hours) at 03/24/2019 0830 Last data filed at 03/23/2019 1859 Gross per 24 hour  Intake 800 ml  Output --  Net 800 ml     Physical Exam: Vital Signs Blood pressure (!) 152/74, pulse 63, temperature 98.3 F (36.8 C), resp. rate 18, height 6' (1.829 m), weight 88.2 kg, SpO2 100 %. Constitutional: No distress . Vital signs reviewed. HEENT: EOMI, oral membranes moist Neck: supple Cardiovascular: RRR without murmur. No JVD    Respiratory: CTA Bilaterally without wheezes or rales. Normal effort    GI: BS +, non-tender, non-distended   Musculoskeletal:  General: Normal range of motion.  Cervical back: Normal range of motion.  Neurological:awake, a little lethargic. Followed basic commands. Normal language. Speech dysarthric. Left CN III palsy with ptosis and decreased medial deviation of left eye. Poor depth perception RUE and RLE 4/5 , 5/5 Left side . No focal sensory deficits.  Skin: Skin iswarm.  Psychiatric: flat    Assessment/Plan: 1. 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  Physiatrist is providing close team supervision and 24 hour management of active medical problems listed below.  Physiatrist and rehab team  continue to assess barriers to discharge/monitor patient progress toward functional and medical goals  Care Tool:  Bathing  Bathing activity did not occur: Refused Body parts bathed by patient: Right arm, Left arm, Chest, Abdomen, Front perineal area, Right upper leg, Left upper leg, Face   Body parts bathed by helper: Buttocks, Face     Bathing assist Assist Level: Total Assistance - Patient < 25%     Upper Body Dressing/Undressing Upper body dressing   What is the patient wearing?: Pull over shirt    Upper body assist Assist Level: Minimal Assistance - Patient > 75%    Lower Body Dressing/Undressing Lower body dressing      What is the patient wearing?: Incontinence brief, Pants     Lower body assist Assist for lower body dressing: Maximal Assistance - Patient 25 - 49%     Toileting Toileting Toileting Activity did not occur (Clothing management and hygiene only): N/A (no void or bm)  Toileting assist Assist for toileting: Maximal Assistance - Patient 25 - 49%     Transfers Chair/bed transfer  Transfers assist  Chair/bed transfer activity did not occur: Safety/medical concerns  Chair/bed transfer assist level: Maximal Assistance - Patient 25 - 49%     Locomotion Ambulation   Ambulation assist      Assist level: Maximal Assistance - Patient 25 - 49% Assistive device: No Device Max distance: 25   Walk 10 feet activity   Assist     Assist level: Maximal Assistance - Patient 25 -  49% Assistive device: No Device   Walk 50 feet activity   Assist Walk 50 feet with 2 turns activity did not occur: Safety/medical concerns         Walk 150 feet activity   Assist Walk 150 feet activity did not occur: Safety/medical concerns         Walk 10 feet on uneven surface  activity   Assist Walk 10 feet on uneven surfaces activity did not occur: Safety/medical concerns(Per report)         Wheelchair     Assist   Type of Wheelchair:  Manual    Wheelchair assist level: Minimal Assistance - Patient > 75% Max wheelchair distance: 136ft    Wheelchair 50 feet with 2 turns activity    Assist        Assist Level: Minimal Assistance - Patient > 75%   Wheelchair 150 feet activity     Assist      Assist Level: Moderate Assistance - Patient 50 - 74%   Blood pressure (!) 152/74, pulse 63, temperature 98.3 F (36.8 C), resp. rate 18, height 6' (1.829 m), weight 88.2 kg, SpO2 100 %.  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 Antithrombotics: -DVT/anticoagulation:Pharmaceutical:Heparin -antiplatelet therapy: ASA/PLAVIX X 3 weeks followed by ASA alone. 3. Pain Management:tylenol prn 4. Mood:LCSW to follow for evaluation and support. -antipsychotic agents: N/A 5. Neuropsych: This patientiscapable of making decisions on hisown behalf. 6. Skin/Wound Care:Routine pressure relief measures. 7. Fluids/Electrolytes/Nutrition:Monitor I/O. Has been refusing nectar liquids. Push PO 8. T2DM with neuropathy/nephropathy: Hgb A1c- Was on ozempic, Lantus, Humalog and glucotrol PTA.               1/23 sugars poorly controlled at present                         -resume lantus insulin, was on 20u daily   1/24-monitor sugars today with addition of lantus 9. ESRD- on PD at nights.On calcitriol and phoslo for 10. Hypokalemia/Hypomagnesemia: supps per nephro. - will defer to Nephro fo rrecs  11. Anemia of chronic disease: On aranesp weekly. Hgb 7.9- stable  12. Chronic diarrhea:Continue creon tid with meals.   -no diarrhea currently 14.HTN: Monitor BP tid--on metoprolol, losartan and low dose  amlodipine             1/24 fair control 15. Chronic dizziness/Orthostatic changes?:  no dizziness reported   -will check orthostatic VS x 2 days 16. Prolonged QTC: QTC 528 on EKG 1/14 17. Lethargy: intermittent  -may be due to midbrain infarct  -elevated CBG's not helping either    -check CMET, CBC look stable no leukocytosis, K+ still low , ask renal to eval   -check ua,ucx today as he is making some urine- Urine with elevated protein but otherwise no sign of infection     LOS: 4 days A FACE TO FACE EVALUATION WAS PERFORMED  [name] 03/24/2019, 8:30 AM

## 2019-03-24 NOTE — Progress Notes (Signed)
Speech Language Pathology Daily Session Note  Patient Details  Name: Cory Kitt MRN: [id number] Date of Birth: 11/14/1950  Today's Date: 03/24/2019 SLP Individual Time: 3338-3291 SLP Individual Time Calculation (min): 44 min  Short Term Goals: Week 1: SLP Short Term Goal 1 (Week 1): Pt wil consume dys 3 textures and thin liquids with minimal overt s/s aspiration and min A verbal cues for use of swallow startegies ( second swallow.) SLP Short Term Goal 1 - Progress (Week 1): Progressing toward goal SLP Short Term Goal 2 (Week 1): Pt will demonstrate use of speech intelligibility strategies (over articulation and slow rate) at sentence level for 90% intelligibility with min A verbal cues. SLP Short Term Goal 3 (Week 1): Pt will demonstrate sustained attention in 15 minute intervals with min A verbal cues for redirection during functional tasks. SLP Short Term Goal 3 - Progress (Week 1): Progressing toward goal SLP Short Term Goal 4 (Week 1): Pt will demonstrate intellectual awareness of 2 cognitive/swallow/speech deficits and 2 physical deficits with mod A verbal cues. SLP Short Term Goal 4 - Progress (Week 1): Progressing toward goal SLP Short Term Goal 5 (Week 1): Pt will complete functional and basic problem solving tasks with min A verbal cues. SLP Short Term Goal 5 - Progress (Week 1): Progressing toward goal SLP Short Term Goal 6 (Week 1): Pt will demonstrate recall of novel and daily information with min A verbal cues for compensatory startegies. SLP Short Term Goal 6 - Progress (Week 1): Progressing toward goal  Skilled Therapeutic Interventions:SLP targeted swallow and cognitive skills. SLP facilitated PO consumption of current diet dys 3 textures and thin liquids. Pt demonstrated recall of swallow strategies, however omits to not using strategy consistently. Pt demonstrated appropriate oral clearance with and no overt s/s aspiration when consuming dys 3 textures and thin via  cup/straw separately, however with mixed consistency x2 delayed cough noted. Pt's cough appeared effective resulting in dry vocal quality following clearing attempt. SLP reviewed most recent MBS results with pt, indicating mild pharyngeal residue. Therefore with pt's improved oral control, attention and tolerance on thin liquids, SLP reduced supervision to intermittent and swallow strategy for intermittent use of dry swallow, but always when consuming mixed consistency. Pt appeared to understand education and agreed to complete dry swallow with mixed consistencies, while avoiding mixing solids and liquids. SLP recommends to facilitate carryover of strategy and focus on solid upgrade trials. Pt requested to brush teeth, providing set up assist only. Pt demonstrates continued improvement in sustained attention and basic problem solving. Pt was left in room with call bell within reach and chair alarm set. ST recommends to continue skilled ST services.      Pain Pain Assessment Pain Score: 0-No pain  Therapy/Group: Individual Therapy  Darrold Bezek  [facility] 03/24/2019, 3:43 PM

## 2019-03-24 NOTE — Progress Notes (Signed)
Occupational Therapy Session Note  Patient Details  Name: Kyle [name] MRN: [id number] Date of Birth: Jun 17, 1950  Today's Date: 03/24/2019 OT Individual Time: 0938-1829 and [id number] OT Individual Time Calculation (min): 58 min and 9 min    Short Term Goals: Week 1:  OT Short Term Goal 1 (Week 1): Pt will complete toilet transfer with 1 assist and LRAD OT Short Term Goal 2 (Week 1): Pt will don overhead shirt with Min A OT Short Term Goal 3 (Week 1): Pt will complete LB self care with Mod A for dynamic standing balance  Skilled Therapeutic Interventions/Progress Updates:    Session 1: Upon entering the room, pt supine in bed and sleeping soundly. OT was able to arouse pt for participation in OT intervention. Pt remains connected to dialysis machine and also IV. Pt verbalized need for toileting. BSC placed next to bed secondary to receiving treatment and medication. Pt required mod A for sit <>stand and mod A for stand pivot transfer to the R secondary to posterior bias. Pt begins unfastening brief before reaching toilet and begins to have BM on floor. Pt seated on BSC to finish emptying and RN arrives to disconnect pt from dialysis treatment. Pt performed grooming tasks while seated on commode. Pt required max A to don LB clothing items while seated and needing total A for hygiene and clothing management  in standing. Pt transferred back to bed with mod A stand pivot transfer. Sit >supine with min guard secondary to fatigue. Bed alarm activated. Call bell and all needed items within reach.   Session 2:  Upon entering the room, pt supine in bed with bed alarm going off attempting to reposition in bed. Pt declines OOB therapy this session secondary to fatigue. OT assists pt with repositioning and encourages pt to participate in OOB tasks but he declines. Bed alarm activated and call bell within reach upon exiting the room.   Therapy Documentation Precautions:  Precautions Precautions:  Fall Restrictions Weight Bearing Restrictions: No   ADL: ADL Eating: Not assessed Grooming: Maximal assistance Where Assessed-Grooming: Edge of bed Upper Body Bathing: Minimal assistance Where Assessed-Upper Body Bathing: Edge of bed Lower Body Bathing: Maximal assistance Where Assessed-Lower Body Bathing: Edge of bed Upper Body Dressing: Moderate assistance Where Assessed-Upper Body Dressing: Edge of bed Lower Body Dressing: Maximal assistance Where Assessed-Lower Body Dressing: Edge of bed Toileting: Not assessed Toilet Transfer: Not assessed Tub/Shower Transfer: Not assessed   Therapy/Group: Individual Therapy  Gypsy Decant 03/24/2019, 12:39 PM

## 2019-03-24 NOTE — Progress Notes (Signed)
[facility] ROUNDING NOTE   Subjective:   This is a 69 year old gentleman with a history of diabetes mellitus type 2 end-stage renal disease on peritoneal dialysis seizure disorder COPD morbid obesity gait disorder with falls chronic diarrhea admitted to [facility] on 03/12/2019 with a history of significant hypercalcemia confusion and weakness.  And having missed dialysis for 3 days.  MRI of the brain for left-sided ptosis revealed an acute left paramedian pons and old infarcts in the left paramedian pons chronic  small vessel disease.  Ejection fraction 65 to 28% grade 1 diastolic dysfunction carotid Dopplers negative for ICA stenosis.  He continues on peritoneal dialysis.  He is using all 1.5% dextrose with successful ultrafiltration  Blood pressure 130/74 pulse 68 temperature 98 O2 sats 100% room air  Sodium 140 potassium 3 chloride 99 CO2 27 BUN 37 creatinine 8.94 glucose 236 albumin 1.9 AST 14 ALT 13 WBC 8 hemoglobin 7.9 platelets 228  Amlodipine 2.5 mg daily, aspirin 81 mg daily Lipitor 80 mg daily calcitriol 0.5 mcg daily PhosLo 1.3 g 3 times daily, Plavix 75 mg daily, Epogen 10,000 units weekly administered 03/22/2019 gabapentin 100 mg twice daily, Lantus 20 units daily Keppra 500 mg twice daily Synthroid 200 mcg daily Creon 24,000 units 3 times daily, metoprolol 50 mg daily multivitamins 1 daily   Objective:  Vital signs in last 24 hours:  Temp:  [97.6 F (36.4 C)-98.3 F (36.8 C)] 98.3 F (36.8 C) (01/26 0537) Pulse Rate:  [59-66] 63 (01/26 0537) Resp:  [16-18] 18 (01/26 0537) BP: (128-154)/(60-74) 152/74 (01/26 0537) SpO2:  [98 %-100 %] 100 % (01/26 0537) Weight:  [88.2 kg] 88.2 kg (01/26 0500)  Weight change: -3.9 kg Filed Weights   03/21/19 1841 03/22/19 0917 03/24/19 0500  Weight: 92.9 kg 92.1 kg 88.2 kg    Intake/Output: I/O last 3 completed shifts: In: 800 [P.O.:800] Out: -    Intake/Output this shift:  No intake/output data  recorded.  ZMO:QHUTMLYYTKP ill, NAD TWS:FKCL eye ptosis EYES:EOMI EX:NTZGYFV, normal S1 and S2 PULM:Clear bilaterally, normal work of breathing, CBS:WHQP, nontender. Bowel sounds present. PD catheter in left upper quadrant, bandaged, deferred on evaluation of exit site SKIN:No rashes or lesions EXT:No significant edema   Basic Metabolic Panel: Recent Labs  Lab 03/20/19 0629 03/21/19 0946 03/23/19 0707  NA 141 142 142  K 3.0* 3.3* 3.0*  CL 100 99 99  CO2 26 28 27   GLUCOSE 207* 184* 236*  BUN 44* 39* 37*  CREATININE 8.63* 8.75* 8.94*  CALCIUM 8.6* 8.9 8.6*  MG 1.5* 1.8  --     Liver Function Tests: Recent Labs  Lab 03/21/19 0946 03/23/19 0707  AST 16 14*  ALT 11 13  ALKPHOS 79 72  BILITOT 0.7 <0.1*  PROT 5.1* 5.2*  ALBUMIN 2.0* 1.9*   No results for input(s): LIPASE, AMYLASE in the last 168 hours. No results for input(s): AMMONIA in the last 168 hours.  CBC: Recent Labs  Lab 03/20/19 0629 03/23/19 0707  WBC 6.4 8.0  HGB 8.1* 7.9*  HCT 24.0* 24.7*  MCV 98.8 102.5*  PLT 195 228    Cardiac Enzymes: No results for input(s): CKTOTAL, CKMB, CKMBINDEX, TROPONINI in the last 168 hours.  BNP: Invalid input(s): POCBNP  CBG: Recent Labs  Lab 03/23/19 0615 03/23/19 1229 03/23/19 1649 03/23/19 2215 03/24/19 0643  GLUCAP 217* 295* 197* 238* 154*    Microbiology: Results for orders placed or performed during the hospital encounter of 03/20/19  Culture, Urine  Status: None   Collection Time: 03/22/19  4:15 PM   Specimen: Urine, Catheterized  Result Value Ref Range Status   Specimen Description URINE, CATHETERIZED  Final   Special Requests SITE NOT SPECIFIED  Final   Culture   Final    NO GROWTH Performed at [facility], [address] [place], [place] [address]    Report Status 03/23/2019 FINAL  Final    Coagulation Studies: No results for input(s): LABPROT, INR in the last 72 hours.  Urinalysis: Recent Labs     03/22/19 1622  COLORURINE YELLOW  LABSPEC 1.018  PHURINE 5.0  GLUCOSEU >=500*  HGBUR NEGATIVE  BILIRUBINUR NEGATIVE  KETONESUR NEGATIVE  PROTEINUR >=300*  NITRITE NEGATIVE  LEUKOCYTESUR NEGATIVE      Imaging: No results found.   Medications:   . dialysis solution 1.5% low-MG/low-CA    . dialysis solution 1.5% low-MG/low-CA    . ferric gluconate (FERRLECIT/NULECIT) IV 125 mg (03/24/19 0732)   . amLODipine  2.5 mg Oral Daily  . aspirin EC  81 mg Oral Daily  . atorvastatin  80 mg Oral Daily  . calcitRIOL  0.5 mcg Oral Daily  . calcium acetate  1,334 mg Oral TID WC  . clopidogrel  75 mg Oral Daily  . [START ON 03/29/2019] darbepoetin (ARANESP) injection - NON-DIALYSIS  25 mcg Subcutaneous Q Sun-1800  . feeding supplement (NEPRO CARB STEADY)  237 mL Oral BID BM  . gabapentin  100 mg Oral BID  . gentamicin cream  1 application Topical Daily  . heparin  5,000 Units Subcutaneous Q8H  . insulin aspart  0-15 Units Subcutaneous TID WC  . insulin glargine  30 Units Subcutaneous Daily  . levETIRAcetam  500 mg Oral BID  . levothyroxine  200 mcg Oral q morning - 10a  . lipase/protease/amylase  24,000 Units Oral TID WC  . metoprolol succinate  50 mg Oral Daily  . multivitamin  1 tablet Oral QHS   acetaminophen, bisacodyl, guaiFENesin-dextromethorphan, dianeal solution for CAPD/CCPD with heparin, polyethylene glycol, prochlorperazine **OR** prochlorperazine **OR** prochlorperazine, Resource ThickenUp Clear  Assessment/ Plan:   ESRD-peritoneal dialysis diabetic nephropathy uses CCPD 2.5 L with 5 exchanges no pauses.  Continues on 1.5% bags with good ultrafiltration  Anemia iron saturation 20% have started IV Ferrlecit and administering darbepoetin 25 mcg weekly to be administered 03/29/2019  Hypokalemia we will increase potassium to 40 mEq 3 times daily  Bone mineral continue binders vitamin D.  Calcium carbonate was discontinued  Chronic pancreatitis continues on pancreatic  enzyme supplementation  Status post pontine stroke continues on Plavix and aspirin  Hypertension/volume continues to ultrafilter well.  Losartan discontinued 03/23/2019  Diabetes mellitus as per primary team  Hypothyroidism on replacement therapy    LOS: [place] @TODAY @8 :44 AM

## 2019-03-24 NOTE — Plan of Care (Signed)
  Problem: Consults Goal: RH STROKE PATIENT EDUCATION Description: See Patient Education module for education specifics  Outcome: Progressing Goal: Nutrition Consult-if indicated Outcome: Progressing Goal: Diabetes Guidelines if Diabetic/Glucose > 140 Description: If diabetic or lab glucose is > 140 mg/dl - Initiate Diabetes/Hyperglycemia Guidelines & Document Interventions  Outcome: Progressing   Problem: RH BOWEL ELIMINATION Goal: RH STG MANAGE BOWEL WITH ASSISTANCE Description: STG Manage Bowel with mod Assistance. Outcome: Progressing Goal: RH STG MANAGE BOWEL W/MEDICATION W/ASSISTANCE Description: STG Manage Bowel with Medication with mod Assistance. Outcome: Progressing   Problem: RH SKIN INTEGRITY Goal: RH STG SKIN FREE OF INFECTION/BREAKDOWN Description: Skin to remain free from infection and breakdown while on rehab with min assist. Outcome: Progressing Goal: RH STG MAINTAIN SKIN INTEGRITY WITH ASSISTANCE Description: STG Maintain Skin Integrity With min Assistance. Outcome: Progressing Goal: RH STG ABLE TO PERFORM INCISION/WOUND CARE W/ASSISTANCE Description: STG Able To Perform Wound Care With mod Assistance. Outcome: Progressing   Problem: RH SAFETY Goal: RH STG ADHERE TO SAFETY PRECAUTIONS W/ASSISTANCE/DEVICE Description: STG Adhere to Safety Precautions With mod Assistance and appropriate assistive Device. Outcome: Progressing   Problem: RH PAIN MANAGEMENT Goal: RH STG PAIN MANAGED AT OR BELOW PT'S PAIN GOAL Description: <3 on a 0-10 pain scale. Outcome: Progressing   Problem: RH KNOWLEDGE DEFICIT Goal: RH STG INCREASE KNOWLEDGE OF DIABETES Description: Patient will increase knowledge on DM medications, dietary restrictions, and follow-up care with the MD after discharge with min assist from staff. Outcome: Progressing Goal: RH STG INCREASE KNOWLEDGE OF HYPERTENSION Description: Patient will increase knowledge of HTN medications, dietary restrictions, and  follow-up care with the MD after discharge with min assist from staff. Outcome: Progressing Goal: RH STG INCREASE KNOWLEDGE OF DYSPHAGIA/FLUID INTAKE Description: Patient will increase and display knowledge of dysphagia diets and thickened liquids appropriate for his dietary restrictions with min assist from staff. Outcome: Progressing Goal: RH STG INCREASE KNOWLEGDE OF HYPERLIPIDEMIA Description: Patient will increase knowledge of HLD medications and follow-up care with the MD after discharge with min assist from staff. Outcome: Progressing Goal: RH STG INCREASE KNOWLEDGE OF STROKE PROPHYLAXIS Description: Patient will increase knowledge of medications used to prevent future strokes and follow-up care with the MD after discharge with min assist from staff. Outcome: Progressing

## 2019-03-25 ENCOUNTER — Inpatient Hospital Stay (HOSPITAL_COMMUNITY): Payer: Medicare PPO | Admitting: Occupational Therapy

## 2019-03-25 ENCOUNTER — Inpatient Hospital Stay (HOSPITAL_COMMUNITY): Payer: Medicare PPO | Admitting: Speech Pathology

## 2019-03-25 ENCOUNTER — Inpatient Hospital Stay (HOSPITAL_COMMUNITY): Payer: Medicare PPO

## 2019-03-25 LAB — RENAL FUNCTION PANEL
Albumin: 2.1 g/dL — ABNORMAL LOW (ref 3.5–5.0)
Anion gap: 12 (ref 5–15)
BUN: 34 mg/dL — ABNORMAL HIGH (ref 8–23)
CO2: 24 mmol/L (ref 22–32)
Calcium: 8.5 mg/dL — ABNORMAL LOW (ref 8.9–10.3)
Chloride: 103 mmol/L (ref 98–111)
Creatinine, Ser: 8.9 mg/dL — ABNORMAL HIGH (ref 0.61–1.24)
GFR calc Af Amer: 6 mL/min — ABNORMAL LOW (ref >60)
GFR calc non Af Amer: 5 mL/min — ABNORMAL LOW (ref >60)
Glucose, Bld: 194 mg/dL — ABNORMAL HIGH (ref 70–99)
Phosphorus: 4.1 mg/dL (ref 2.5–4.6)
Potassium: 4.8 mmol/L (ref 3.5–5.1)
Sodium: 139 mmol/L (ref 135–145)

## 2019-03-25 LAB — GLUCOSE, CAPILLARY
Glucose-Capillary: 156 mg/dL — ABNORMAL HIGH (ref 70–99)
Glucose-Capillary: 249 mg/dL — ABNORMAL HIGH (ref 70–99)
Glucose-Capillary: 149 mg/dL — ABNORMAL HIGH (ref 70–99)

## 2019-03-25 MED ORDER — MODAFINIL 100 MG PO TABS
100.0000 mg | ORAL_TABLET | Freq: Every day | ORAL | Status: DC
  Administered 2019-03-25 – 2019-04-04 (×10): 100 mg via ORAL
  Filled 2019-03-25 (×10): qty 1

## 2019-03-25 NOTE — Progress Notes (Signed)
[place] PHYSICAL MEDICINE & REHABILITATION PROGRESS NOTE   Subjective/Complaints:  No issues last noc, states that bowels are ok, ready for breakfast   ROS: Patient denies nausea, vomiting, diarrhea, cough, shortness of breath or chest pain, joint or back pain, headache, or mood change.    Objective:   No results found. Recent Labs    03/23/19 0707  WBC 8.0  HGB 7.9*  HCT 24.7*  PLT 228   Recent Labs    03/23/19 0707  NA 142  K 3.0*  CL 99  CO2 27  GLUCOSE 236*  BUN 37*  CREATININE 8.94*  CALCIUM 8.6*    Intake/Output Summary (Last 24 hours) at 03/25/2019 0852 Last data filed at 03/24/2019 2000 Gross per 24 hour  Intake 360 ml  Output 1 ml  Net 359 ml     Physical Exam: Vital Signs Blood pressure (!) 142/68, pulse 63, temperature 97.6 F (36.4 C), temperature source Oral, resp. rate 18, height 6' (1.829 m), weight 95 kg, SpO2 99 %. Constitutional: No distress . Vital signs reviewed. HEENT: EOMI, oral membranes moist Neck: supple Cardiovascular: RRR without murmur. No JVD    Respiratory: CTA Bilaterally without wheezes or rales. Normal effort    GI: BS +, non-tender, non-distended   Musculoskeletal:  General: Normal range of motion.  Cervical back: Normal range of motion.  Neurological:awake, a little lethargic. Followed basic commands. Normal language. Speech dysarthric. Left CN III palsy with ptosis and decreased medial deviation of left eye. Poor depth perception RUE and RLE 4/5 , 5/5 Left side . No focal sensory deficits.  Skin: Skin iswarm.  Psychiatric: flat    Assessment/Plan: 1. 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  Physiatrist is providing close team supervision and 24 hour management of active medical problems listed below.  Physiatrist and rehab team continue to assess barriers to  discharge/monitor patient progress toward functional and medical goals  Care Tool:  Bathing  Bathing activity did not occur: Refused Body parts bathed by patient: Right arm, Left arm, Chest, Abdomen, Front perineal area, Right upper leg, Left upper leg, Face   Body parts bathed by helper: Buttocks, Face     Bathing assist Assist Level: Total Assistance - Patient < 25%     Upper Body Dressing/Undressing Upper body dressing   What is the patient wearing?: Pull over shirt    Upper body assist Assist Level: Minimal Assistance - Patient > 75%    Lower Body Dressing/Undressing Lower body dressing      What is the patient wearing?: Incontinence brief, Pants     Lower body assist Assist for lower body dressing: Moderate Assistance - Patient 50 - 74%     Toileting Toileting Toileting Activity did not occur Landscape architect and hygiene only): N/A (no void or bm)  Toileting assist Assist for toileting: Maximal Assistance - Patient 25 - 49%     Transfers Chair/bed transfer  Transfers assist  Chair/bed transfer activity did not occur: Safety/medical concerns  Chair/bed transfer assist level: Moderate Assistance - Patient 50 - 74%     Locomotion Ambulation   Ambulation assist      Assist level: Maximal Assistance - Patient 25 - 49% Assistive device: No Device Max distance: 25   Walk 10 feet activity   Assist     Assist level: Maximal Assistance - Patient 25 - 49% Assistive device: No Device   Walk  50 feet activity   Assist Walk 50 feet with 2 turns activity did not occur: Safety/medical concerns         Walk 150 feet activity   Assist Walk 150 feet activity did not occur: Safety/medical concerns         Walk 10 feet on uneven surface  activity   Assist Walk 10 feet on uneven surfaces activity did not occur: Safety/medical concerns(Per report)         Wheelchair     Assist   Type of Wheelchair: Manual    Wheelchair assist  level: Minimal Assistance - Patient > 75% Max wheelchair distance: 135f    Wheelchair 50 feet with 2 turns activity    Assist        Assist Level: Minimal Assistance - Patient > 75%   Wheelchair 150 feet activity     Assist      Assist Level: Moderate Assistance - Patient 50 - 74%   Blood pressure (!) 142/68, pulse 63, temperature 97.6 F (36.4 C), temperature source Oral, resp. rate 18, height 6' (1.829 m), weight 95 kg, SpO2 99 %.  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 Formulized individual treatment plan based on medical history, underlying problem and comorbidities. -ELOS/Goals: mod I to min assist, 10-14 days  --Continue CIR therapies including PT, OT, and SLP  2. Antithrombotics: -DVT/anticoagulation:Pharmaceutical:Heparin -antiplatelet therapy: ASA/PLAVIX X 3 weeks followed by ASA alone. 3. Pain Management:tylenol prn 4. Mood:LCSW to follow for evaluation and support. -antipsychotic agents: N/A 5. Neuropsych: This patientiscapable of making decisions on hisown behalf. 6. Skin/Wound Care:Routine pressure relief measures. 7. Fluids/Electrolytes/Nutrition:Monitor I/O. Has been refusing nectar liquids. Push PO 8. T2DM with neuropathy/nephropathy: Hgb A1c- Was on ozempic, Lantus, Humalog and glucotrol PTA.               1/23 sugars poorly controlled at present                         -resume lantus insulin, was on 20u daily   1/24-monitor sugars today with addition of lantus 9. ESRD- on PD at nights.On calcitriol and phoslo for 10. Hypokalemia/Hypomagnesemia: supps per nephro. - nephro ordered KCL 417m  TID 11. Anemia of chronic disease: On aranesp weekly. Hgb 7.9- stable  12. Chronic diarrhea:Continue creon tid with meals.   -no diarrhea currently 14.HTN: Monitor BP tid--on metoprolol, losartan and low dose amlodipine             1/24 fair control 15. Chronic dizziness/Orthostatic changes?:  no dizziness reported   -will check orthostatic VS x 2 days 16. Prolonged QTC: QTC 528 on EKG 1/14 17. Lethargy: intermittent  -may be due to midbrain infarct  -elevated CBG's not helping either    -check CMET, CBC look stable no leukocytosis, K+ still low , ask renal to eval   -check ua,ucx today as he is making some urine- Urine with elevated protein but otherwise no sign of infection     LOS: 5 days A FACE TO FACE EVALUATION WAS PERFORMED  AnCharlett Blake/27/2021, 8:52 AM

## 2019-03-25 NOTE — Progress Notes (Signed)
Occupational Therapy Session Note  Patient Details  Name: Kyle [name] MRN: [id number] Date of Birth: 1950-07-13  Today's Date: 03/25/2019 OT Individual Time: 1100-1154 OT Individual Time Calculation (min): 54 min    Short Term Goals: Week 1:  OT Short Term Goal 1 (Week 1): Pt will complete toilet transfer with 1 assist and LRAD OT Short Term Goal 2 (Week 1): Pt will don overhead shirt with Min A OT Short Term Goal 3 (Week 1): Pt will complete LB self care with Mod A for dynamic standing balance  Skilled Therapeutic Interventions/Progress Updates:    Upon entering the room, pt supine in bed with RN present giving medications. Pt is agreeable to OT intervention. Supine >sit with mod A to EOB and mod multimodal cuing for technique. Pt bathing UB from seated position on EOB with SBA - mod A for dynamic sitting balance with R lean noted during session. Pt verbalizing need for BM. Stand pivot transfer with mod A into wheelchair and assisted into bathroom. Stand pivot onto commode chair with pt holding onto L grab bar and sitting balance being close supervision overall. Pt needing assistance with hygiene while seated. Pt crossing LEs into figure four position with assistance but needing assist to thread onto B feet and then standing to pull over hips. Pt seated in wheelchair for grooming tasks with set up A and mod cuing for sequencing. Pt remaining in wheelchair with chair alarm belt donned and call bell within reach for lunch.  Therapy Documentation Precautions:  Precautions Precautions: Fall Restrictions Weight Bearing Restrictions: No ADL: ADL Eating: Not assessed Grooming: Maximal assistance Where Assessed-Grooming: Edge of bed Upper Body Bathing: Minimal assistance Where Assessed-Upper Body Bathing: Edge of bed Lower Body Bathing: Maximal assistance Where Assessed-Lower Body Bathing: Edge of bed Upper Body Dressing: Moderate assistance Where Assessed-Upper Body Dressing: Edge  of bed Lower Body Dressing: Maximal assistance Where Assessed-Lower Body Dressing: Edge of bed Toileting: Not assessed Toilet Transfer: Not assessed Tub/Shower Transfer: Not assessed   Therapy/Group: Individual Therapy  Gypsy Decant 03/25/2019, 12:03 PM

## 2019-03-25 NOTE — Patient Care Conference (Signed)
Inpatient RehabilitationTeam Conference and Plan of Care Update Date: 03/25/2019   Time: 10:10 AM    Patient Name: Kyle [name]      Medical Record Number: [id number]  Date of Birth: 1950/11/27 Sex: Male         Room/Bed: 4W20C/4W20C-01 Payor Info: Payor: HU[employer] MEDICARE / Plan: [employer] MEDICARE CHOICE PPO / Product Type: *No Product type* /    Admit Date/Time:  03/20/2019  3:08 PM  Primary Diagnosis:  Left pontine stroke [facility])  Patient Active Problem List   Diagnosis Date Noted  . 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[place]) 03/20/2019  . Generalized weakness   . Diabetic polyneuropathy associated with type 2 diabetes mellitus ([place])   . Cerebrovascular accident (CVA) ([place])   . PVD (peripheral vascular disease) ([place]) 03/17/2019  . Ptosis of eyelid, left 03/15/2019  . Physical deconditioning 03/14/2019  . Hypomagnesemia 03/13/2019  . Hyperphosphatemia 03/13/2019  . ESRD (end stage renal disease) ([place]) 03/13/2019  . Chronic kidney disease with peritoneal dialysis as preferred modality, stage 5 ([place]) 03/13/2019  . Prolonged QT interval 03/13/2019  . Essential hypertension 03/13/2019  . Hypothyroidism 03/13/2019  . Chronic diarrhea 03/13/2019  . Hypocalcemia 03/12/2019  . Hypokalemia 09/16/2018  . Lung nodule 06/12/2016  . Narrowing of airway   . Cigarette smoker 05/24/2016  . Collapse of right lung 05/24/2016  . COPD with chronic bronchitis ([place]) 05/24/2016  . Acne 05/20/2015  . Calculus of kidney 05/20/2015  . Chest pain, non-cardiac 05/20/2015  . Seizure ([place]) 05/20/2015  . Current tobacco use 05/20/2015  . Chronic kidney disease (CKD), stage III (moderate) 03/29/2015  . Type 2 diabetes mellitus with hyperlipidemia ([place]) 03/15/2015  . Lipoma of shoulder 03/08/2014  . Other synovitis and tenosynovitis, right shoulder 03/08/2014  . Bursitis of elbow 02/15/2014  . Absolute anemia 08/08/2013  . 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 Back pain, chronic 08/08/2013  . Chronic obstructive  pulmonary disease ([place]) 08/08/2013  . Diabetes mellitus ([place]) 08/08/2013  . BP (high blood pressure) 08/08/2013  . HLD (hyperlipidemia) 08/08/2013  . Acne erythematosa 08/08/2013    Expected Discharge Date: Expected Discharge Date: 04/07/19  Team Members Present: Physician leading conference: Dr. Alysia Penna Social Worker Present: Lennart Pall, LCSW Nurse Present: Dorien Chihuahua, Bertram Savin, RN Case Manager: Karene Fry, RN PT Present: Phylliss Bob, PTA;Leavy Cella, PT OT Present: Darleen Crocker, OT SLP Present: Jettie Booze, CF-SLP PPS Coordinator present : Ileana Ladd, Burna Mortimer, SLP     Current Status/Progress Goal Weekly Team Focus  Bowel/Bladder   Peritoneal dialysis pt; continent of bowel LBM:01/26  maintain regular bowel pattern  assist with toileting needs prn   Swallow/Nutrition/ Hydration   Supervision A with dys 3 and thin, interittment supervision  Supervision A  swallow startegies (avoiding mixed consistencies) and regular trial upgrades   ADL's   Mod A bathing, Mod A UB dressing, Max A LB dressing, Mod A BSC transfer, Max A toileting  Supervision overall, CGA shower transfer  ADL retraining, NMR, functional transfers, postural control, safety awareness, sitting/standing balance   Mobility   MinA bed mobiltiy, min/modA STS, min/modA stand pivot, maxA gait         Communication   nearing baseline per pt, impacted by fatigue, min A  Supervision A - likely upgrade to conversation level  speech intelligibility strategies   Safety/Cognition/ Behavioral Observations  Min A increase in basic problem solving, sutstained attention and awareness  Supervision A - will likely upgrade skills level  intellectual/emergent awareness, basic/semi-complex problem solving,  sustained attention and recall   Pain   no c/o pain  remain pain free  assess pain QS and prn   Skin   skin tears bil UEs, foams; PD catheter LLQ  remain free of new skin infection/breakdown   assess skin QS and prn    Rehab Goals Patient on target to meet rehab goals: Yes *See Care Plan and progress notes for long and short-term goals.     Barriers to Discharge  Current Status/Progress Possible Resolutions Date Resolved   Nursing                  PT                    OT                  SLP                SW Decreased caregiver support;Lack of/limited family support;Hemodialysis Patient completed PD independently prior to admission, wife will need to assist at discharge            Discharge Planning/Teaching Needs:  Home with wife who can provide minimal assistance  TBD; wife will need education on PD   Team Discussion: Mid brain infarct, PD ongoing, renal managing issues.  MD would like a sleep chart.  RN lethargic/sleepy, inc/cont.  OT mod bathing, mod UB, max LB, max toileting, mod transfers, S goals, CGA shower goal. PT min bed, mod sit to stand, mod/max gait, leans L.  SLP upgraded to D3thins, goal S, communication/speech dysarthric min A, goals S.  Cognition basic prob solving upgrade goals to min A.  Wife at home, was sedentary at home, has 24/7 S from wife.    Revisions to Treatment Plan: N/A     Medical Summary Current Status: CBG control improveed, on PD, Hypo K managed by Nephro Weekly Focus/Goal: manage fluid an delectrolytes  Barriers to Discharge: Other (comments);Incontinence  Barriers to Discharge Comments: bladder incont, Peritoneal dialysis Possible Resolutions to Barriers: Nephro assist , timed toileting   Continued Need for Acute Rehabilitation Level of Care: The patient requires daily medical management by a physician with specialized training in physical medicine and rehabilitation for the following reasons: Direction of a multidisciplinary physical rehabilitation program to maximize functional independence : Yes Medical management of patient stability for increased activity during participation in an intensive rehabilitation regime.:  Yes Analysis of laboratory values and/or radiology reports with any subsequent need for medication adjustment and/or medical intervention. : Yes   I attest that I was present, lead the team conference, and concur with the assessment and plan of the team.   Jodell Cipro M 03/25/2019, 2:36 PM   Team conference was held via web/ teleconference due to [place] - 19

## 2019-03-25 NOTE — Progress Notes (Signed)
Patient was found upper body on the floor by [name], NT. Patient states he was trying to reach for the phone and reached too far and fell over. He states he did not hit his head and that he did not hurt any other part of his body. Vitals are stable and PA notified.

## 2019-03-25 NOTE — Progress Notes (Signed)
Speech Language Pathology Daily Session Note  Patient Details  Name: Kyle [name] MRN: [id number] Date of Birth: [birth year]  Today's Date: 03/25/2019 SLP Individual Time: 0600-4599 SLP Individual Time Calculation (min): 55 min  Short Term Goals: Week 1: SLP Short Term Goal 1 (Week 1): Pt wil consume dys 3 textures and thin liquids with minimal overt s/s aspiration and min A verbal cues for use of swallow startegies ( second swallow.) SLP Short Term Goal 1 - Progress (Week 1): Progressing toward goal SLP Short Term Goal 2 (Week 1): Pt will demonstrate use of speech intelligibility strategies (over articulation and slow rate) at sentence level for 90% intelligibility with min A verbal cues. SLP Short Term Goal 3 (Week 1): Pt will demonstrate sustained attention in 15 minute intervals with min A verbal cues for redirection during functional tasks. SLP Short Term Goal 3 - Progress (Week 1): Progressing toward goal SLP Short Term Goal 4 (Week 1): Pt will demonstrate intellectual awareness of 2 cognitive/swallow/speech deficits and 2 physical deficits with mod A verbal cues. SLP Short Term Goal 4 - Progress (Week 1): Progressing toward goal SLP Short Term Goal 5 (Week 1): Pt will complete functional and basic problem solving tasks with min A verbal cues. SLP Short Term Goal 5 - Progress (Week 1): Progressing toward goal SLP Short Term Goal 6 (Week 1): Pt will demonstrate recall of novel and daily information with min A verbal cues for compensatory startegies. SLP Short Term Goal 6 - Progress (Week 1): Progressing toward goal  Skilled Therapeutic Interventions: Skilled treatment session focused on cognitive goals. SLP facilitated session by providing Mod-Max A verbal cues for recall of his current medications and their functions. SLP also facilitated session by attempting to organize a QD pill box, however, patient demonstrated increased difficulty with problem solving throughout task, therefore,  recommend re-attempting task during next session. Patient with intermittent confusion throughout session but was easily redirected. Patient left upright in bed with alarm on and all needs within reach. Continue with current plan of care.      Pain Pain Assessment Pain Scale: 0-10 Pain Score: 0-No pain  Therapy/Group: Individual Therapy  Tema Alire 03/25/2019, 2:57 PM

## 2019-03-25 NOTE — Progress Notes (Signed)
[facility] ROUNDING NOTE   Subjective:   This is a 69 year old gentleman with a history of diabetes mellitus type 2 end-stage renal disease on peritoneal dialysis seizure disorder COPD morbid obesity gait disorder with falls chronic diarrhea admitted to [facility] on 03/12/2019 with a history of significant hypercalcemia confusion and weakness.  And having missed dialysis for 3 days.  MRI of the brain for left-sided ptosis revealed an acute left paramedian pons and old infarcts in the left paramedian pons chronic  small vessel disease.  Ejection fraction 65 to 24% grade 1 diastolic dysfunction carotid Dopplers negative for ICA stenosis.  He continues on peritoneal dialysis.  He is using all 1.5% dextrose with successful ultrafiltration  Blood pressure 142/68 pulse 63 temperature 97.6 O2 sats 9 9% room air    Amlodipine 2.5 mg daily, aspirin 81 mg daily Lipitor 80 mg daily calcitriol 0.5 mcg daily PhosLo 1.3 g 3 times daily, Plavix 75 mg daily, Epogen 10,000 units weekly administered 03/22/2019 gabapentin 100 mg twice daily, Lantus 20 units daily Keppra 500 mg twice daily Synthroid 200 mcg daily Creon 24,000 units 3 times daily, metoprolol 50 mg daily multivitamins 1 daily.   Objective:  Vital signs in last 24 hours:  Temp:  [97.6 F (36.4 C)-98 F (36.7 C)] 97.6 F (36.4 C) (01/27 0701) Pulse Rate:  [63-72] 63 (01/27 0701) Resp:  [17-18] 18 (01/27 0701) BP: (142-155)/(66-68) 142/68 (01/27 0701) SpO2:  [99 %-100 %] 99 % (01/27 0555) Weight:  [95 kg] 95 kg (01/26 1813)  Weight change: 6.8 kg Filed Weights   03/22/19 0917 03/24/19 0500 03/24/19 1813  Weight: 92.1 kg 88.2 kg 95 kg    Intake/Output: I/O last 3 completed shifts: In: 600 [P.O.:600] Out: 1 [Stool:1]   Intake/Output this shift:  No intake/output data recorded.  OXB:DZHGDJMEQAS ill, NAD TMH:DQQI eye ptosis EYES:EOMI WL:NLGXQJJ, normal S1 and S2 PULM:Clear bilaterally, normal  work of breathing, HER:DEYC, nontender. Bowel sounds present. PD catheter in left upper quadrant, bandaged, deferred on evaluation of exit site SKIN:No rashes or lesions EXT:No significant edema   Basic Metabolic Panel: Recent Labs  Lab 03/20/19 0629 03/21/19 0946 03/23/19 0707  NA 141 142 142  K 3.0* 3.3* 3.0*  CL 100 99 99  CO2 26 28 27   GLUCOSE 207* 184* 236*  BUN 44* 39* 37*  CREATININE 8.63* 8.75* 8.94*  CALCIUM 8.6* 8.9 8.6*  MG 1.5* 1.8  --     Liver Function Tests: Recent Labs  Lab 03/21/19 0946 03/23/19 0707  AST 16 14*  ALT 11 13  ALKPHOS 79 72  BILITOT 0.7 <0.1*  PROT 5.1* 5.2*  ALBUMIN 2.0* 1.9*   No results for input(s): LIPASE, AMYLASE in the last 168 hours. No results for input(s): AMMONIA in the last 168 hours.  CBC: Recent Labs  Lab 03/20/19 0629 03/23/19 0707  WBC 6.4 8.0  HGB 8.1* 7.9*  HCT 24.0* 24.7*  MCV 98.8 102.5*  PLT 195 228    Cardiac Enzymes: No results for input(s): CKTOTAL, CKMB, CKMBINDEX, TROPONINI in the last 168 hours.  BNP: Invalid input(s): POCBNP  CBG: Recent Labs  Lab 03/24/19 0643 03/24/19 1203 03/24/19 1640 03/24/19 2111 03/25/19 0600  GLUCAP 154* 236* 230* 294* 156*    Microbiology: Results for orders placed or performed during the hospital encounter of 03/20/19  Culture, Urine     Status: None   Collection Time: 03/22/19  4:15 PM   Specimen: Urine, Catheterized  Result Value Ref Range  Status   Specimen Description URINE, CATHETERIZED  Final   Special Requests SITE NOT SPECIFIED  Final   Culture   Final    NO GROWTH Performed at [facility], [place] [address] [place], [place] [address]    Report Status 03/23/2019 FINAL  Final    Coagulation Studies: No results for input(s): LABPROT, INR in the last 72 hours.  Urinalysis: Recent Labs    03/22/19 1622  COLORURINE YELLOW  LABSPEC 1.018  PHURINE 5.0  GLUCOSEU >=500*  HGBUR NEGATIVE  BILIRUBINUR NEGATIVE  KETONESUR NEGATIVE   PROTEINUR >=300*  NITRITE NEGATIVE  LEUKOCYTESUR NEGATIVE      Imaging: No results found.   Medications:   . dialysis solution 1.5% low-MG/low-CA    . dialysis solution 1.5% low-MG/low-CA    . ferric gluconate (FERRLECIT/NULECIT) IV 125 mg (03/24/19 0732)   . amLODipine  2.5 mg Oral Daily  . aspirin EC  81 mg Oral Daily  . atorvastatin  80 mg Oral Daily  . calcitRIOL  0.5 mcg Oral Daily  . calcium acetate  1,334 mg Oral TID WC  . clopidogrel  75 mg Oral Daily  . [START ON 03/29/2019] darbepoetin (ARANESP) injection - NON-DIALYSIS  25 mcg Subcutaneous Q Sun-1800  . feeding supplement (NEPRO CARB STEADY)  237 mL Oral BID BM  . gabapentin  100 mg Oral BID  . gentamicin cream  1 application Topical Daily  . heparin  5,000 Units Subcutaneous Q8H  . insulin aspart  0-15 Units Subcutaneous TID WC  . insulin glargine  30 Units Subcutaneous Daily  . levETIRAcetam  500 mg Oral BID  . levothyroxine  200 mcg Oral q morning - 10a  . lipase/protease/amylase  24,000 Units Oral TID WC  . metoprolol succinate  50 mg Oral Daily  . multivitamin  1 tablet Oral Daily  . potassium chloride  40 mEq Oral TID   acetaminophen, bisacodyl, guaiFENesin-dextromethorphan, dianeal solution for CAPD/CCPD with heparin, polyethylene glycol, prochlorperazine **OR** prochlorperazine **OR** prochlorperazine, Resource ThickenUp Clear  Assessment/ Plan:   ESRD-peritoneal dialysis diabetic nephropathy uses CCPD 2.5 L with 5 exchanges no pauses.  Continues on 1.5% bags with good ultrafiltration  Anemia iron saturation 20% have started IV Ferrlecit and administering darbepoetin 25 mcg weekly to be administered 03/29/2019  Hypokalemia we will increase potassium to 40 mEq 3 times daily  Bone mineral continue binders vitamin D.  Continues on PhosLo 2 with meals  Chronic pancreatitis continues on pancreatic enzyme supplementation  Status post pontine stroke continues on Plavix and aspirin  Hypertension/volume  continues to ultrafilter well.  Losartan discontinued 03/23/2019  Diabetes mellitus as per primary team  Hypothyroidism on replacement therapy  Hypokalemia 40 mEq 3 times daily we will follow up renal panel not ordered this morning we will order daily renal panels    LOS: 5 Sherril Croon @TODAY @8 :51 AM

## 2019-03-25 NOTE — Progress Notes (Signed)
Team Conference Report to Patient/Family  Team Conference discussion was reviewed with the patient, including goals, any changes in plan of care and target discharge date.  Patient expressed understanding The patient has a target discharge date of 04/07/19. Contacted the wife to review concerns about medication and available support at the home at discharge as the patient has trouble with concentration, attention and has mild confusion regarding his medication. Wife [name] noted she is not in agreement with a discharge date of 04/07/19 set at this time. "call me back in two weeks and we will discuss his progress", then "we can talk about discharge". Noted she is "concerned that the discharge planning is premature. Her husband managed his PD himself, she has never assisted with this". She noted "she cannot manage him like he was prior to admission any more; falling all the time,... she is not in good health and does not feel she will be able to assist him" even at a supervision level. Asked " who is the they that made up these goals?" Clarified the team (MD, PT, OT, SW/CM, RN, SLP) meet on a weekly basis and set up goals based on their assessment and projection of progress the team feels the patient can make. Wife noted she "would not be able to provide assistance with medications as the patient" takes what he wants, he is stubborn". She  does not feel she can assist with CGA for showers "what happens if he falls in the shower?, I cannot get him up". Also noted she was "promised that rehab would be communicating with her on her husband and no one has contacted her before today".  Requested to speak with the physician or "someone who can tell me what is going on with my husband". Notified MD of wife's request for an update.  Dorien Chihuahua B 03/25/2019, 3:56 PM

## 2019-03-25 NOTE — Progress Notes (Signed)
Physical Therapy Session Note  Patient Details  Name: Kyle [name] MRN: [id number] Date of Birth: 11/04/1950  Today's Date: 03/25/2019 PT Individual Time: 1300-1330 PT Individual Time Calculation (min): 30 min   Short Term Goals: Week 1:  PT Short Term Goal 1 (Week 1): Pt will transfer to [facility] with min assist consistently PT Short Term Goal 2 (Week 1): Pt will ambulate 30ft with mod assist and LRAD PT Short Term Goal 3 (Week 1): Pt will propel WC 181ft with supevision assist PT Short Term Goal 4 (Week 1): Pt will perform bed mobility with min assist Week 2:    Week 3:     Skilled Therapeutic Interventions/Progress Updates:    PAINdenies pain this pm  Pt initially OOB in wc and agreeable to treatment session with focus on balance/midline orientation.  Pt transported to gym for session.  SPT wc to mat w/mod-max assist and max verbal cues for sequencing and safety,  heavy post  R lean.  In sitting pt demonstrates R lean, easily distracted from tasks.  Worked on reaching to L to place pegs in peg board at increasing distance for progressing wt shift to L.  Pt then able to sit at midline w/cga, cues due to tendency to dift post/R.   STS from mat x 5 w/emphasis on achieving upright midline posture.  Assistance for transition varies from min to max assist, once standing pt able to stand w/cga using mirror for visual feedback, but easily distracted and drifts R when not attending to task.  Maintains balance w/mirror and attention to task up to 45 sec SPT mat to wc to L side w/min assist and max verbal cueing for safety.   Pt left oob in wc w/alarm belt set and needs in reach, nursing w/pt to attend to Iv.     Therapy Documentation Precautions:  Precautions Precautions: Fall Restrictions Weight Bearing Restrictions: No     Therapy/Group: [place], PT  03/25/2019, 1:38 PM

## 2019-03-26 ENCOUNTER — Inpatient Hospital Stay (HOSPITAL_COMMUNITY): Payer: Medicare PPO | Admitting: Occupational Therapy

## 2019-03-26 ENCOUNTER — Inpatient Hospital Stay (HOSPITAL_COMMUNITY): Payer: Medicare PPO

## 2019-03-26 ENCOUNTER — Inpatient Hospital Stay (HOSPITAL_COMMUNITY): Payer: Medicare PPO | Admitting: Physical Therapy

## 2019-03-26 ENCOUNTER — Inpatient Hospital Stay (HOSPITAL_COMMUNITY): Payer: Medicare PPO | Admitting: Speech Pathology

## 2019-03-26 LAB — RENAL FUNCTION PANEL
Albumin: 2 g/dL — ABNORMAL LOW (ref 3.5–5.0)
Anion gap: 15 (ref 5–15)
BUN: 35 mg/dL — ABNORMAL HIGH (ref 8–23)
CO2: 26 mmol/L (ref 22–32)
Calcium: 9.1 mg/dL (ref 8.9–10.3)
Chloride: 100 mmol/L (ref 98–111)
Creatinine, Ser: 8.83 mg/dL — ABNORMAL HIGH (ref 0.61–1.24)
GFR calc Af Amer: 6 mL/min — ABNORMAL LOW (ref >60)
GFR calc non Af Amer: 5 mL/min — ABNORMAL LOW (ref >60)
Glucose, Bld: 168 mg/dL — ABNORMAL HIGH (ref 70–99)
Phosphorus: 3.8 mg/dL (ref 2.5–4.6)
Potassium: 5 mmol/L (ref 3.5–5.1)
Sodium: 141 mmol/L (ref 135–145)

## 2019-03-26 LAB — GLUCOSE, CAPILLARY
Glucose-Capillary: 239 mg/dL — ABNORMAL HIGH (ref 70–99)
Glucose-Capillary: 156 mg/dL — ABNORMAL HIGH (ref 70–99)
Glucose-Capillary: 244 mg/dL — ABNORMAL HIGH (ref 70–99)
Glucose-Capillary: 191 mg/dL — ABNORMAL HIGH (ref 70–99)

## 2019-03-26 MED ORDER — AMLODIPINE BESYLATE 5 MG PO TABS
5.0000 mg | ORAL_TABLET | Freq: Every day | ORAL | Status: DC
  Administered 2019-03-27 – 2019-04-04 (×8): 5 mg via ORAL
  Filled 2019-03-26 (×9): qty 1

## 2019-03-26 NOTE — Progress Notes (Signed)
Physical Therapy Session Note  Patient Details  Name: Kyle [name] MRN: [id number] Date of Birth: 15-Dec-1950  Today's Date: 03/26/2019 PT Individual Time: 1300-1400 PT Individual Time Calculation (min): 60 min   Short Term Goals: Week 1:  PT Short Term Goal 1 (Week 1): Pt will transfer to [facility] with min assist consistently PT Short Term Goal 2 (Week 1): Pt will ambulate 82f with mod assist and LRAD PT Short Term Goal 3 (Week 1): Pt will propel WC 1041fwith supevision assist PT Short Term Goal 4 (Week 1): Pt will perform bed mobility with min assist  Skilled Therapeutic Interventions/Progress Updates:   Pt received sitting in recliner and agreeable to PT. [facility] with min assist and UE support on RW. Pt transported to rehab gym. Stand pivot performed to and from mat table with min assist and RW. PT instructed pt in sitting balance/problem solving of low difficulty peg board puzzle, pt able to maintain balance with supervision assist, and only requiring min cues for error detection. Standing balance while engaged in problem solving task of moderate diffiuclty peg board puzzle. CGA initially fading to mod assist with fatigue due to LOB to the R, pt unable to correct aafter ~ 2 minutes for each bout requiring seated rest break.   Gait training with RUE over therapist shoulder [id number] [id number]-mod assist for safety. Gait training also performed with RW and mod assist x [id number] to constant R LE flexion and inability to correct R LOB.   Patient returned to room and performed ambulatory transfer to recliner with min assist and RUE over therapist shoulder, pt left sitting in recliner with call bell in reach and all needs met.         Therapy Documentation Precautions:  Precautions Precautions: Fall Restrictions Weight Bearing Restrictions: No Pain: denies  Therapy/Group: Individual Therapy  AustiLorie Phenix/2021, 2:01 PM

## 2019-03-26 NOTE — Progress Notes (Signed)
Occupational Therapy Session Note  Patient Details  Name: Kyle [name] MRN: [id number] Date of Birth: Apr 18, 1950  Today's Date: 03/26/2019 OT Individual Time: 1100-1200 and 1520-1600 OT Individual Time Calculation (min): 60 min and 40 mins   Short Term Goals: Week 1:  OT Short Term Goal 1 (Week 1): Pt will complete toilet transfer with 1 assist and LRAD OT Short Term Goal 2 (Week 1): Pt will don overhead shirt with Min A OT Short Term Goal 3 (Week 1): Pt will complete LB self care with Mod A for dynamic standing balance  Skilled Therapeutic Interventions/Progress Updates:    Session 1: Upon entering the room, pt supine in bed and sleeping sound. Multiple attempts made to wake pt and taking over 10 minutes to awaken him for participation. Pt with moments of less clarity and communication with confusion. Pt refused to don LB clothing and needs maximal encouragement to transfer into recliner chair. Pt performs bed mobility with min A and mod A stand pivot transfer into recliner chair. Pt verbalized that at home he primarily only transferred from bed <> BSC and did not usually wear pants. Pt appears to live a very sedentary lifestyle. Chair alarm belt donned for safety with mats placed on floor. Call bell within reach and lunch tray placed in front of pt.   Session 2: Upon entering the room, pt sleeping soundly in bed and after multiple attempts OT was able to awaken for participation. Pt refused OOB activities this session but is agreeable to B UE strengthening. OT demonstrates B UE strengthening exercises with use of level 2 theraband. Pt returning demonstrations with min cuing for proper technique. Pt taking rest breaks as needed. Pt remained in bed at end of session with call bell and all needed items within reach.   Therapy Documentation Precautions:  Precautions Precautions: Fall Restrictions Weight Bearing Restrictions: (P) No General:   Vital Signs: Therapy Vitals Pulse Rate:  76 BP: (!) 160/78 ADL: ADL Eating: Not assessed Grooming: Maximal assistance Where Assessed-Grooming: Edge of bed Upper Body Bathing: Minimal assistance Where Assessed-Upper Body Bathing: Edge of bed Lower Body Bathing: Maximal assistance Where Assessed-Lower Body Bathing: Edge of bed Upper Body Dressing: Moderate assistance Where Assessed-Upper Body Dressing: Edge of bed Lower Body Dressing: Maximal assistance Where Assessed-Lower Body Dressing: Edge of bed Toileting: Not assessed Toilet Transfer: Not assessed Tub/Shower Transfer: Not assessed   Therapy/Group: Individual Therapy  Gypsy Decant 03/26/2019, 12:12 PM

## 2019-03-26 NOTE — Progress Notes (Signed)
Speech Language Pathology Daily Session Note  Patient Details  Name: Kyle [name] MRN: [id number] Date of Birth: May 17, 1950  Today's Date: 03/26/2019 SLP Individual Time: 4193-7902 SLP Individual Time Calculation (min): 21 min  Short Term Goals: Week 1: SLP Short Term Goal 1 (Week 1): Pt wil consume dys 3 textures and thin liquids with minimal overt s/s aspiration and min A verbal cues for use of swallow startegies ( second swallow.) SLP Short Term Goal 1 - Progress (Week 1): Progressing toward goal SLP Short Term Goal 2 (Week 1): Pt will demonstrate use of speech intelligibility strategies (over articulation and slow rate) at sentence level for 90% intelligibility with min A verbal cues. SLP Short Term Goal 3 (Week 1): Pt will demonstrate sustained attention in 15 minute intervals with min A verbal cues for redirection during functional tasks. SLP Short Term Goal 3 - Progress (Week 1): Progressing toward goal SLP Short Term Goal 4 (Week 1): Pt will demonstrate intellectual awareness of 2 cognitive/swallow/speech deficits and 2 physical deficits with mod A verbal cues. SLP Short Term Goal 4 - Progress (Week 1): Progressing toward goal SLP Short Term Goal 5 (Week 1): Pt will complete functional and basic problem solving tasks with min A verbal cues. SLP Short Term Goal 5 - Progress (Week 1): Progressing toward goal SLP Short Term Goal 6 (Week 1): Pt will demonstrate recall of novel and daily information with min A verbal cues for compensatory startegies. SLP Short Term Goal 6 - Progress (Week 1): Progressing toward goal  Skilled Therapeutic Interventions:   Skilled treatment session focused on cognition goals. SLP facilitated session by providing Min A cues for sustained attention to basic task and Max A cues for intellectual awareness of cognitive and swallow deficits as well as Max A cues for recall of daily information. Pt was left upright in wheelchair, lap belt alarm on and all needs  within reach. Continue per current plan of care.        Pain    Therapy/Group: Individual Therapy  Kyle [name] 03/26/2019, 3:29 PM

## 2019-03-26 NOTE — Progress Notes (Signed)
[place] PHYSICAL MEDICINE & REHABILITATION PROGRESS NOTE   Subjective/Complaints:  Fall yesterday no apparent injury , unwitness so CT ordered for this am , No MS changes   ROS: Patient denies nausea, vomiting, diarrhea, cough, shortness of breath or chest pain, joint or back pain, headache, or mood change.    Objective:   No results found. No results for input(s): WBC, HGB, HCT, PLT in the last 72 hours. Recent Labs    03/25/19 0945 03/26/19 0530  NA 139 141  K 4.8 5.0  CL 103 100  CO2 24 26  GLUCOSE 194* 168*  BUN 34* 35*  CREATININE 8.90* 8.83*  CALCIUM 8.5* 9.1    Intake/Output Summary (Last 24 hours) at 03/26/2019 0837 Last data filed at 03/26/2019 0600 Gross per 24 hour  Intake 120 ml  Output --  Net 120 ml     Physical Exam: Vital Signs Blood pressure (!) 160/78, pulse 76, temperature 97.9 F (36.6 C), temperature source Oral, resp. rate 18, height 6' (1.829 m), weight 94.4 kg, SpO2 100 %. Constitutional: No distress . Vital signs reviewed. HEENT: EOMI, oral membranes moist Neck: supple Cardiovascular: RRR without murmur. No JVD    Respiratory: CTA Bilaterally without wheezes or rales. Normal effort    GI: BS +, non-tender, non-distended   Musculoskeletal:  General: Normal range of motion.  no pain with ROM  Neurological:awake, a little lethargic. Followed basic commands. Normal language. Speech dysarthric. Left CN III palsy with ptosis and decreased medial deviation of left eye. Poor depth perception RUE and RLE 4/5 , 5/5 Left side . No focal sensory deficits.  Skin: Skin iswarm.  Psychiatric: flat    Assessment/Plan: 1. 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  Physiatrist is providing close team supervision and 24 hour management of active medical problems listed below.  Physiatrist and rehab team continue  to assess barriers to discharge/monitor patient progress toward functional and medical goals  Care Tool:  Bathing  Bathing activity did not occur: Refused Body parts bathed by patient: Right arm, Left arm, Chest, Abdomen, Front perineal area, Right upper leg, Left upper leg, Face   Body parts bathed by helper: Buttocks, Right lower leg, Left lower leg     Bathing assist Assist Level: Moderate Assistance - Patient 50 - 74%     Upper Body Dressing/Undressing Upper body dressing   What is the patient wearing?: Pull over shirt    Upper body assist Assist Level: Minimal Assistance - Patient > 75%    Lower Body Dressing/Undressing Lower body dressing      What is the patient wearing?: Incontinence brief, Pants     Lower body assist Assist for lower body dressing: Maximal Assistance - Patient 25 - 49%     Toileting Toileting Toileting Activity did not occur (Clothing management and hygiene only): N/A (no void or bm)  Toileting assist Assist for toileting: Maximal Assistance - Patient 25 - 49%     Transfers Chair/bed transfer  Transfers assist  Chair/bed transfer activity did not occur: Safety/medical concerns  Chair/bed transfer assist level: Moderate Assistance - Patient 50 - 74%(varied min to max and max verbal cues)     Locomotion Ambulation   Ambulation assist      Assist level: Maximal Assistance - Patient 25 - 49% Assistive device: No Device Max distance: 25   Walk 10 feet activity   Assist  Assist level: Maximal Assistance - Patient 25 - 49% Assistive device: No Device   Walk 50 feet activity   Assist Walk 50 feet with 2 turns activity did not occur: Safety/medical concerns         Walk 150 feet activity   Assist Walk 150 feet activity did not occur: Safety/medical concerns         Walk 10 feet on uneven surface  activity   Assist Walk 10 feet on uneven surfaces activity did not occur: Safety/medical concerns(Per report)          Wheelchair     Assist   Type of Wheelchair: Manual    Wheelchair assist level: Minimal Assistance - Patient > 75% Max wheelchair distance: 179ft    Wheelchair 50 feet with 2 turns activity    Assist        Assist Level: Minimal Assistance - Patient > 75%   Wheelchair 150 feet activity     Assist      Assist Level: Moderate Assistance - Patient 50 - 74%   Blood pressure (!) 160/78, pulse 76, temperature 97.9 F (36.6 C), temperature source Oral, resp. rate 18, height 6' (1.829 m), weight 94.4 kg, SpO2 100 %.  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 Antithrombotics: -DVT/anticoagulation:Pharmaceutical:Heparin -antiplatelet therapy: ASA/PLAVIX X 3 weeks followed by ASA alone. 3. Pain Management:tylenol prn 4. Mood:LCSW to follow for evaluation and support. -antipsychotic agents: N/A 5. Neuropsych: This patientiscapable of making decisions on hisown behalf. 6. Skin/Wound Care:Routine pressure relief measures. 7. Fluids/Electrolytes/Nutrition:Monitor I/O. Has been refusing nectar liquids. Push PO 8. T2DM with neuropathy/nephropathy: Hgb A1c- Was on ozempic, Lantus, Humalog and glucotrol PTA.            CBG (last 3)  Recent Labs    03/25/19 1656 03/25/19 2100 03/26/19 0609  GLUCAP 149* 239* 156*    9. ESRD- on PD at nights.On calcitriol and phoslo for 10. Hypokalemia/Hypomagnesemia: supps per nephro. - nephro ordered KCL 66meq TID 11. Anemia of chronic disease: On aranesp weekly. Hgb 7.9- stable  12. Chronic diarrhea:Continue creon tid with meals.   -no diarrhea currently 14.HTN: Monitor BP tid--on metoprolol,  losartan and low dose amlodipine             1/24 fair control 15. Chronic dizziness/Orthostatic changes?:  no dizziness reported   -will check orthostatic VS x 2 days 16. Prolonged QTC: QTC 528 on EKG 1/14 17. Lethargy: intermittent- likely CVA releated       LOS: 6 days A FACE TO FACE EVALUATION WAS PERFORMED  Kyle [name] 03/26/2019, 8:37 AM

## 2019-03-26 NOTE — NC FL2 (Signed)
[place] LEVEL OF CARE SCREENING TOOL     IDENTIFICATION  Patient Name: Kyle [name] Birthdate: [birth year] Sex: male Admission Date (Current Location): 03/20/2019  [facility] and [place] Number:  Engineering geologist and Address:  [facility], [place] [address] [place], [place] [address]      Provider Number: [id number]  Attending Physician Name and Address:  [name], MD  Relative Name and Phone Number:  Maddax Palinkas [phone]    Current Level of Care: Hospital Recommended Level of Care: [place] Prior Approval Number:    Date Approved/Denied:   PASRR Number: [id number]  Discharge Plan: SNF    Current Diagnoses: Patient Active Problem List   Diagnosis Date Noted  . 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[place]) 03/20/2019  . Generalized weakness   . Diabetic polyneuropathy associated with type 2 diabetes mellitus ([place])   . Cerebrovascular accident (CVA) ([place])   . PVD (peripheral vascular disease) ([place]) 03/17/2019  . Ptosis of eyelid, left 03/15/2019  . Physical deconditioning 03/14/2019  . Hypomagnesemia 03/13/2019  . Hyperphosphatemia 03/13/2019  . ESRD (end stage renal disease) ([place]) 03/13/2019  . Chronic kidney disease with peritoneal dialysis as preferred modality, stage 5 ([place]) 03/13/2019  . Prolonged QT interval 03/13/2019  . Essential hypertension 03/13/2019  . Hypothyroidism 03/13/2019  . Chronic diarrhea 03/13/2019  . Hypocalcemia 03/12/2019  . Hypokalemia 09/16/2018  . Lung nodule 06/12/2016  . Narrowing of airway   . Cigarette smoker 05/24/2016  . Collapse of right lung 05/24/2016  . COPD with chronic bronchitis ([place]) 05/24/2016  . Acne 05/20/2015  . Calculus of kidney 05/20/2015  . Chest pain, non-cardiac 05/20/2015  . Seizure ([place]) 05/20/2015  . Current tobacco use 05/20/2015  . Chronic kidney disease (CKD), stage III (moderate) 03/29/2015  . Type 2 diabetes mellitus with hyperlipidemia ([place])  03/15/2015  . Lipoma of shoulder 03/08/2014  . Other synovitis and tenosynovitis, right shoulder 03/08/2014  . Bursitis of elbow 02/15/2014  . Absolute anemia 08/08/2013  . 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 Back pain, chronic 08/08/2013  . Chronic obstructive pulmonary disease ([place]) 08/08/2013  . Diabetes mellitus ([place]) 08/08/2013  . BP (high blood pressure) 08/08/2013  . HLD (hyperlipidemia) 08/08/2013  . Acne erythematosa 08/08/2013    Orientation RESPIRATION BLADDER Height & Weight     Self, Time, Situation, Place  Normal Incontinent Weight: 208 lb 1.8 oz (94.4 kg) Height:  6' (182.9 cm)  BEHAVIORAL SYMPTOMS/MOOD NEUROLOGICAL BOWEL NUTRITION STATUS      Incontinent Diet(D3 thin diet)  AMBULATORY STATUS COMMUNICATION OF NEEDS Skin   Extensive Assist Verbally Normal                       Personal Care Assistance Level of Assistance  Dressing, Bathing Bathing Assistance: Limited assistance   Dressing Assistance: Limited assistance     Functional Limitations Info  Hearing, Sight Sight Info: Impaired(left eye ptosis; 3rd cranial nerve palsy from stroke)        SPECIAL CARE FACTORS FREQUENCY  PT (By licensed PT), OT (By licensed OT), Speech therapy     PT Frequency: 5xs per week OT Frequency: 5xs per week     Speech Therapy Frequency: 5xs per week      Contractures Contractures Info: Not present    Additional Factors Info  Insulin Sliding Scale       Insulin Sliding Scale Info: Moderate correction coverage AC and HS; Lantus 30 units daily  Current Medications (03/26/2019):  This is the current hospital active medication list Current Facility-Administered Medications  Medication Dose Route Frequency Provider Last Rate Last Admin  . acetaminophen (TYLENOL) tablet 325-650 mg  325-650 mg Oral Q4H PRN Love, Ivan Anchors, PA-C      . [START ON 03/27/2019] amLODipine (NORVASC) tablet 5 mg  5 mg Oral Daily Edrick Oh, MD      . aspirin EC tablet 81  mg  81 mg Oral Daily Bary Leriche, PA-C   81 mg at 03/26/19 0834  . atorvastatin (LIPITOR) tablet 80 mg  80 mg Oral Daily Bary Leriche, PA-C   80 mg at 03/25/19 2005  . bisacodyl (DULCOLAX) suppository 10 mg  10 mg Rectal Daily PRN Love, Pamela S, PA-C      . calcium acetate (PHOSLO) capsule 1,334 mg  1,334 mg Oral TID WC LoveIvan Anchors, PA-C   1,334 mg at 03/26/19 1305  . clopidogrel (PLAVIX) tablet 75 mg  75 mg Oral Daily Bary Leriche, PA-C   75 mg at 03/26/19 0834  . [START ON 03/29/2019] Darbepoetin Alfa (ARANESP) injection 25 mcg  25 mcg Subcutaneous Q Sun-1800 Edrick Oh, MD      . dialysis solution 1.5% low-MG/low-CA dianeal solution   Intraperitoneal Q24H Pearson Grippe B, MD   5,000 mL at 03/25/19 1937  . dialysis solution 1.5% low-MG/low-CA dianeal solution   Intraperitoneal Q24H Love, Pamela S, PA-C      . feeding supplement (NEPRO CARB STEADY) liquid 237 mL  237 mL Oral BID BM Love, Ivan Anchors, PA-C   237 mL at 03/26/19 1100  . ferric gluconate (NULECIT) 125 mg in sodium chloride 0.9 % 100 mL IVPB  125 mg Intravenous Daily Pearson Grippe B, MD 110 mL/hr at 03/26/19 0840 125 mg at 03/26/19 0840  . gabapentin (NEURONTIN) capsule 100 mg  100 mg Oral BID Bary Leriche, PA-C   100 mg at 03/26/19 1962  . gentamicin cream (GARAMYCIN) 0.1 % 1 application  1 application Topical Daily Rexene Agent, MD   1 application at 22/97/98 [phone]  . guaiFENesin-dextromethorphan (ROBITUSSIN DM) 100-10 MG/5ML syrup 5-10 mL  5-10 mL Oral Q6H PRN Love, Pamela S, PA-C      . heparin 2,500 Units in dialysis solution 1.5% low-MG/low-CA 5,000 mL dialysis solution   Peritoneal Dialysis PRN Rexene Agent, MD      . heparin injection 5,000 Units  5,000 Units Subcutaneous Q8H Bary Leriche, PA-C   5,000 Units at 03/26/19 0539  . insulin aspart (novoLOG) injection 0-15 Units  0-15 Units Subcutaneous TID WC Bary Leriche, PA-C   5 Units at 03/26/19 1304  . insulin glargine (LANTUS) injection 30 Units  30 Units  Subcutaneous Daily [name], MD   30 Units at 03/26/19 [phone]  . levETIRAcetam (KEPPRA) 100 MG/ML solution 500 mg  500 mg Oral BID [name], MD   500 mg at 03/26/19 0834  . levothyroxine (SYNTHROID) tablet 200 mcg  200 mcg Oral q morning - 10a Love, Ivan Anchors, PA-C   200 mcg at 03/26/19 0539  . lipase/protease/amylase (CREON) capsule 24,000 Units  24,000 Units Oral TID WC Bary Leriche, PA-C   24,000 Units at 03/26/19 1319  . metoprolol succinate (TOPROL-XL) 24 hr tablet 50 mg  50 mg Oral Daily Bary Leriche, PA-C   50 mg at 03/26/19 4081  . modafinil (PROVIGIL) tablet 100 mg  100 mg Oral Daily Kirsteins, Luanna Salk, MD  100 mg at 03/26/19 0834  . multivitamin (RENA-VIT) tablet 1 tablet  1 tablet Oral Daily Kirsteins, Luanna Salk, MD   1 tablet at 03/26/19 [phone]  . polyethylene glycol (MIRALAX / GLYCOLAX) packet 17 g  17 g Oral Daily PRN Love, Pamela S, PA-C      . prochlorperazine (COMPAZINE) tablet 5-10 mg  5-10 mg Oral Q6H PRN Love, Pamela S, PA-C       Or  . prochlorperazine (COMPAZINE) injection 5-10 mg  5-10 mg Intramuscular Q6H PRN Love, Pamela S, PA-C       Or  . prochlorperazine (COMPAZINE) suppository 12.5 mg  12.5 mg Rectal Q6H PRN Love, Ivan Anchors, PA-C      . Resource ThickenUp Clear   Oral PRN Meredith Staggers, MD         Discharge Medications: Please see discharge summary for a list of discharge medications.  Relevant Imaging Results:  Relevant Lab Results:   Additional Information Continous cycling for peritoneal dialysis  SS# [id number]  Loralee Pacas, MSW

## 2019-03-26 NOTE — Progress Notes (Signed)
Patient had a fall during day shift. Ct was ordered. Pt was hooked up to peritoneal dialysis. Hemodialysis was called to [facility] patient but stated "If I unhook them right now I will not be able to hook them back up". On call MD, Reesa Chew was called. She said the CT can be done in the morning, post peritoneal dialysis. Patient currently alert and oriented to person, place and situation. Call bell at bedside.

## 2019-03-26 NOTE — Progress Notes (Addendum)
[facility] ROUNDING NOTE   Subjective:   This is a 69 year old gentleman with a history of diabetes mellitus type 2 end-stage renal disease on peritoneal dialysis seizure disorder COPD morbid obesity gait disorder with falls chronic diarrhea admitted to [facility] on 03/12/2019 with a history of significant hypercalcemia confusion and weakness.  And having missed dialysis for 3 days.  MRI of the brain for left-sided ptosis revealed an acute left paramedian pons and old infarcts in the left paramedian pons chronic  small vessel disease.  Ejection fraction 65 to 78% grade 1 diastolic dysfunction carotid Dopplers negative for ICA stenosis.  He continues on peritoneal dialysis.  He is using all 1.5% dextrose with successful ultrafiltration  Blood pressure 160/78 pulse 76 temperature 97.6 O2 sats 100 % room air  Sodium 141 potassium 5.0 chloride 100 CO2 20 BUN 35 creatinine 8.83 glucose 168 calcium 9.1 phosphorus 3.8 albumin 2.0.  WBC 8.0 hemoglobin 7.9 platelets 228  Amlodipine 2.5 mg daily, aspirin 81 mg daily Lipitor 80 mg daily calcitriol 0.5 mcg daily PhosLo 1.3 g 3 times daily, Plavix 75 mg daily, Epogen 10,000 units weekly administered 03/22/2019 gabapentin 100 mg twice daily, Lantus 20 units daily Keppra 500 mg twice daily Synthroid 200 mcg daily Creon 24,000 units 3 times daily, metoprolol 50 mg daily multivitamins 1 daily, provigil 100mg  daily   Objective:  Vital signs in last 24 hours:  Temp:  [97.6 F (36.4 C)-98.6 F (37 C)] 97.9 F (36.6 C) (01/28 0502) Pulse Rate:  [67-76] 76 (01/28 0833) Resp:  [16-20] 18 (01/28 0502) BP: (135-162)/(59-78) 160/78 (01/28 0833) SpO2:  [99 %-100 %] 100 % (01/28 0502) Weight:  [94.4 kg] 94.4 kg (01/27 1925)  Weight change: -0.6 kg Filed Weights   03/24/19 0500 03/24/19 1813 03/25/19 1925  Weight: 88.2 kg 95 kg 94.4 kg    Intake/Output: I/O last 3 completed shifts: In: 120 [P.O.:120] Out: 1 [Stool:1]    Intake/Output this shift:  No intake/output data recorded.  HYI:FOYDXAJOINO ill, NAD MVE:HMCN eye ptosis EYES:EOMI OB:SJGGEZM, normal S1 and S2 PULM:Clear bilaterally, normal work of breathing, OQH:UTML, nontender. Bowel sounds present. PD catheter in left upper quadrant, bandaged, deferred on evaluation of exit site SKIN:No rashes or lesions EXT:No significant edema   Basic Metabolic Panel: Recent Labs  Lab 03/20/19 0629 03/20/19 0629 03/21/19 0946 03/21/19 0946 03/23/19 0707 03/25/19 0945 03/26/19 0530  NA 141  --  142  --  142 139 141  K 3.0*  --  3.3*  --  3.0* 4.8 5.0  CL 100  --  99  --  99 103 100  CO2 26  --  28  --  27 24 26   GLUCOSE 207*  --  184*  --  236* 194* 168*  BUN 44*  --  39*  --  37* 34* 35*  CREATININE 8.63*  --  8.75*  --  8.94* 8.90* 8.83*  CALCIUM 8.6*   < > 8.9   < > 8.6* 8.5* 9.1  MG 1.5*  --  1.8  --   --   --   --   PHOS  --   --   --   --   --  4.1 3.8   < > = values in this interval not displayed.    Liver Function Tests: Recent Labs  Lab 03/21/19 0946 03/23/19 0707 03/25/19 0945 03/26/19 0530  AST 16 14*  --   --   ALT 11 13  --   --  ALKPHOS 79 72  --   --   BILITOT 0.7 <0.1*  --   --   PROT 5.1* 5.2*  --   --   ALBUMIN 2.0* 1.9* 2.1* 2.0*   No results for input(s): LIPASE, AMYLASE in the last 168 hours. No results for input(s): AMMONIA in the last 168 hours.  CBC: Recent Labs  Lab 03/20/19 0629 03/23/19 0707  WBC 6.4 8.0  HGB 8.1* 7.9*  HCT 24.0* 24.7*  MCV 98.8 102.5*  PLT 195 228    Cardiac Enzymes: No results for input(s): CKTOTAL, CKMB, CKMBINDEX, TROPONINI in the last 168 hours.  BNP: Invalid input(s): POCBNP  CBG: Recent Labs  Lab 03/25/19 0600 03/25/19 1155 03/25/19 1656 03/25/19 2100 03/26/19 0609  GLUCAP 156* 249* 149* 25* 156*    Microbiology: Results for orders placed or performed during the hospital encounter of 03/20/19  Culture, Urine     Status: None   Collection Time:  03/22/19  4:15 PM   Specimen: Urine, Catheterized  Result Value Ref Range Status   Specimen Description URINE, CATHETERIZED  Final   Special Requests SITE NOT SPECIFIED  Final   Culture   Final    NO GROWTH Performed at [facility], [place] [address] [place], [place] [address]    Report Status 03/23/2019 FINAL  Final    Coagulation Studies: No results for input(s): LABPROT, INR in the last 72 hours.  Urinalysis: No results for input(s): COLORURINE, LABSPEC, PHURINE, GLUCOSEU, HGBUR, BILIRUBINUR, KETONESUR, PROTEINUR, UROBILINOGEN, NITRITE, LEUKOCYTESUR in the last 72 hours.  Invalid input(s): APPERANCEUR    Imaging: No results found.   Medications:   . dialysis solution 1.5% low-MG/low-CA    . dialysis solution 1.5% low-MG/low-CA    . ferric gluconate (FERRLECIT/NULECIT) IV 125 mg (03/26/19 0840)   . amLODipine  2.5 mg Oral Daily  . aspirin EC  81 mg Oral Daily  . atorvastatin  80 mg Oral Daily  . calcitRIOL  0.5 mcg Oral Daily  . calcium acetate  1,334 mg Oral TID WC  . clopidogrel  75 mg Oral Daily  . [START ON 03/29/2019] darbepoetin (ARANESP) injection - NON-DIALYSIS  25 mcg Subcutaneous Q Sun-1800  . feeding supplement (NEPRO CARB STEADY)  237 mL Oral BID BM  . gabapentin  100 mg Oral BID  . gentamicin cream  1 application Topical Daily  . heparin  5,000 Units Subcutaneous Q8H  . insulin aspart  0-15 Units Subcutaneous TID WC  . insulin glargine  30 Units Subcutaneous Daily  . levETIRAcetam  500 mg Oral BID  . levothyroxine  200 mcg Oral q morning - 10a  . lipase/protease/amylase  24,000 Units Oral TID WC  . metoprolol succinate  50 mg Oral Daily  . modafinil  100 mg Oral Daily  . multivitamin  1 tablet Oral Daily   acetaminophen, bisacodyl, guaiFENesin-dextromethorphan, dianeal solution for CAPD/CCPD with heparin, polyethylene glycol, prochlorperazine **OR** prochlorperazine **OR** prochlorperazine, Resource ThickenUp Clear  Assessment/ Plan:    ESRD-peritoneal dialysis diabetic nephropathy uses CCPD 2.5 L with 5 exchanges no pauses.  Continues on 1.5% bags with good ultrafiltration  Anemia iron saturation 20% have started IV Ferrlecit and administering darbepoetin 25 mcg weekly to be administered 03/29/2019  History of hypokalemia.  Potassium supplementation.  We will continue to follow  Bone mineral continue binders vitamin D.  Continues on PhosLo 2 with meals.  Corrected calcium slightly elevated will discontinue vitamin D at this point.  PTH 91  Chronic pancreatitis continues on  pancreatic enzyme supplementation  Status post pontine stroke continues on Plavix and aspirin  Hypertension/volume continues to ultrafilter well.  Losartan discontinued 03/23/2019.  Will increase amlodipine to 5 mg daily  Diabetes mellitus as per primary team  Hypothyroidism on replacement therapy     LOS: [place] @TODAY @9 :02 AM

## 2019-03-27 ENCOUNTER — Inpatient Hospital Stay (HOSPITAL_COMMUNITY): Payer: Medicare PPO | Admitting: Occupational Therapy

## 2019-03-27 ENCOUNTER — Inpatient Hospital Stay (HOSPITAL_COMMUNITY): Payer: Medicare PPO | Admitting: Physical Therapy

## 2019-03-27 ENCOUNTER — Inpatient Hospital Stay (HOSPITAL_COMMUNITY): Payer: Medicare PPO | Admitting: Speech Pathology

## 2019-03-27 DIAGNOSIS — Z992 Dependence on renal dialysis: Secondary | ICD-10-CM

## 2019-03-27 DIAGNOSIS — N186 End stage renal disease: Secondary | ICD-10-CM

## 2019-03-27 DIAGNOSIS — J449 Chronic obstructive pulmonary disease, unspecified: Secondary | ICD-10-CM

## 2019-03-27 LAB — RENAL FUNCTION PANEL
Albumin: 1.9 g/dL — ABNORMAL LOW (ref 3.5–5.0)
Anion gap: 11 (ref 5–15)
BUN: 39 mg/dL — ABNORMAL HIGH (ref 8–23)
CO2: 24 mmol/L (ref 22–32)
Calcium: 8.9 mg/dL (ref 8.9–10.3)
Chloride: 102 mmol/L (ref 98–111)
Creatinine, Ser: 9.05 mg/dL — ABNORMAL HIGH (ref 0.61–1.24)
GFR calc Af Amer: 6 mL/min — ABNORMAL LOW (ref >60)
GFR calc non Af Amer: 5 mL/min — ABNORMAL LOW (ref >60)
Glucose, Bld: 170 mg/dL — ABNORMAL HIGH (ref 70–99)
Phosphorus: 4.7 mg/dL — ABNORMAL HIGH (ref 2.5–4.6)
Potassium: 4.6 mmol/L (ref 3.5–5.1)
Sodium: 137 mmol/L (ref 135–145)

## 2019-03-27 LAB — GLUCOSE, CAPILLARY
Glucose-Capillary: 159 mg/dL — ABNORMAL HIGH (ref 70–99)
Glucose-Capillary: 162 mg/dL — ABNORMAL HIGH (ref 70–99)
Glucose-Capillary: 216 mg/dL — ABNORMAL HIGH (ref 70–99)
Glucose-Capillary: 144 mg/dL — ABNORMAL HIGH (ref 70–99)
Glucose-Capillary: 232 mg/dL — ABNORMAL HIGH (ref 70–99)

## 2019-03-27 NOTE — Progress Notes (Signed)
Physical Therapy Weekly Progress Note  Patient Details  Name: Kyle [name] MRN: [id number] Date of Birth: 08/17/1950  Beginning of progress report period: March 21, 2019 End of progress report period: March 27, 2019  Today's Date: 03/27/2019 PT Individual Time: 0905-1015  PT Individual Time Calculation (min): 70 min   Patient has met 3 of 4 short term goals. Pt is making steady progress towards LTG. Pt continues to demonstrate poor midline awareness with R lateral LOB and mild inattention to the RUE/RLE. Transfers completed with as little as min assist, but more consistently requires mod assist. Min-supervision assist WC  Mobility with poor control in turns requiring assist from PT through doorway.   Patient continues to demonstrate the following deficits muscle weakness, muscle joint tightness and muscle paralysis, decreased cardiorespiratoy endurance, impaired timing and sequencing, abnormal tone, motor apraxia, ataxia, decreased coordination and decreased motor planning, decreased visual acuity, decreased visual perceptual skills, decreased visual motor skills and field cut, decreased midline orientation and decreased attention to right, decreased initiation, decreased attention, decreased awareness, decreased problem solving, decreased safety awareness, decreased memory and delayed processing and decreased sitting balance, decreased standing balance, decreased postural control, hemiplegia and decreased balance strategies and therefore will continue to benefit from skilled PT intervention to increase functional independence with mobility.  Patient progressing toward long term goals..  Continue plan of care.  PT Short Term Goals Week 1:  PT Short Term Goal 1 (Week 1): Pt will transfer to [facility] with min assist consistently PT Short Term Goal 1 - Progress (Week 1): Progressing toward goal PT Short Term Goal 2 (Week 1): Pt will ambulate 41f with mod assist and LRAD PT Short Term Goal 2 -  Progress (Week 1): Met PT Short Term Goal 3 (Week 1): Pt will propel WC 1083fwith supevision assist PT Short Term Goal 3 - Progress (Week 1): Met PT Short Term Goal 4 (Week 1): Pt will perform bed mobility with min assist PT Short Term Goal 4 - Progress (Week 1): Met Week 2:  PT Short Term Goal 1 (Week 2): Pt will maintain standing balance with min assist up to 5 minutes while engaged in functional task PT Short Term Goal 2 (Week 2): Pt will ambulate 5014fith min assist and LRAD PT Short Term Goal 3 (Week 2): Pt will transfer to WC A Rosie Placeth min assist and LRAD consistently PT Short Term Goal 4 (Week 2): Pt will maintian sitting balance with supervision assist  Skilled Therapeutic Interventions/Progress Updates:   Pt received supine in bed and agreeable to PT. Supine>sit transfer with min assist and HOB elevated. Min assist from PT to come to midline and maintain sitting balance. Sit<>stand from EOB with mod assist. Mod-max assist from PT to prevent R LOB in order for PT to pull pant to waist. Stand  Pivot [facility] assist and arm over therapist shoulder.   WC mobility through hall x [id number] assist- CGA for doorway management and symmetry for BUE.    Dynamic standing balance to preform lateral reach on the L 3 x 3. Heavy pushing response when attempting to reach outside BOS on the L.   Gait training x [id number] arm of PT shoulder and mod fading to max assist. Increasing flexed knee in BLE and pt inability to correct on the R without significant assist from  PT to prevent LOB. Pt reports need for BM.   Toilet transfer with mod-max assist and inconsistent use of Rail on the  L side. PT performed all peri care and clothing management. Mod assist stand pivot transfer to return to [facility]. And max cues for posture and safety in standing.   Kinetron reciprocal movement training 3 x 2 minutes with moderate cues for symmetry of movement throughout and increasing ROM on the RLE.    Patient returned to room and left sitting in [facility] with call bell in reach and all needs met.        Therapy Documentation Precautions:  Precautions Precautions: Fall Restrictions Weight Bearing Restrictions: No Pain:   denies   Therapy/Group: Individual Therapy  Lorie Phenix 03/27/2019, 4:22 PM

## 2019-03-27 NOTE — Progress Notes (Addendum)
[place] PHYSICAL MEDICINE & REHABILITATION PROGRESS NOTE   Subjective/Complaints:  No sinus pain or drainage  Discussed HD vs PD with Nephro   ROS: Patient denies nausea, vomiting, diarrhea, cough, shortness of breath or chest pain, joint or back pain, headache, or mood change.    Objective:   CT HEAD WO CONTRAST  Result Date: 03/26/2019 CLINICAL DATA:  Follow-up stroke.  Found on the floor. EXAM: CT HEAD WITHOUT CONTRAST TECHNIQUE: Contiguous axial images were obtained from the base of the skull through the vertex without intravenous contrast. COMPARISON:  MRI 03/15/2019.  CT 03/17/2019. FINDINGS: Brain: Low-density remains visible at the site of recent infarction of the left paramedian mid brain. Old infarction of the left pons as seen previously. No evidence of hemorrhage or swelling. Old small vessel cerebellar infarctions. Chronic small-vessel ischemic changes of the cerebral hemispheric white matter. No sign of additional infarction. No hydrocephalus or extra-axial collection. Vascular: There is atherosclerotic calcification of the major vessels at the base of the brain. Skull: Negative Sinuses/Orbits: Chronic opacification of the left maxillary sinus with mucoid material. Fungal sinusitis not excluded. Other: None IMPRESSION: No new finding. Of all vein changes of recent infarction of the left paramedian mid brain. No hemorrhage. Old ischemic changes elsewhere as noted above. Left maxillary sinus disease which could be chronic sinusitis with inspissated mucus or fungal sinusitis. Electronically Signed   By: Nelson Chimes M.D.   On: 03/26/2019 09:17   No results for input(s): WBC, HGB, HCT, PLT in the last 72 hours. Recent Labs    03/26/19 0530 03/27/19 0526  NA 141 137  K 5.0 4.6  CL 100 102  CO2 26 24  GLUCOSE 168* 170*  BUN 35* 39*  CREATININE 8.83* 9.05*  CALCIUM 9.1 8.9    Intake/Output Summary (Last 24 hours) at 03/27/2019 0828 Last data filed at 03/26/2019 1900 Gross  per 24 hour  Intake 12855 ml  Output 13091 ml  Net -236 ml     Physical Exam: Vital Signs Blood pressure (!) 149/68, pulse 67, temperature 97.6 F (36.4 C), resp. rate 18, height 6' (1.829 m), weight 94.4 kg, SpO2 96 %. Constitutional: No distress . Vital signs reviewed. HEENT: EOMI, oral membranes moist Neck: supple Cardiovascular: RRR without murmur. No JVD    Respiratory: CTA Bilaterally without wheezes or rales. Normal effort    GI: BS +, non-tender, non-distended   Musculoskeletal:  General: Normal range of motion.  no pain with ROM  Neurological:awake, a little lethargic. Followed basic commands. Normal language. Speech dysarthric. Left CN III palsy with ptosis and decreased medial deviation of left eye. Poor depth perception RUE and RLE 4/5 , 5/5 Left side . No focal sensory deficits.  Skin: Skin iswarm.  Psychiatric: flat    Assessment/Plan: 1. 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  Physiatrist is providing close team supervision and 24 hour management of active medical problems listed below.  Physiatrist and rehab team continue to assess barriers to discharge/monitor patient progress toward functional and medical goals  Care Tool:  Bathing  Bathing activity did not occur: Refused Body parts bathed by patient: Right arm, Left arm, Chest, Abdomen, Front perineal area, Right upper leg, Left upper leg, Face   Body parts bathed by helper: Buttocks, Right lower leg, Left lower leg     Bathing assist Assist Level: Moderate Assistance - Patient 50 - 74%  Upper Body Dressing/Undressing Upper body dressing   What is the patient wearing?: Pull over shirt    Upper body assist Assist Level: Minimal Assistance - Patient > 75%    Lower Body Dressing/Undressing Lower body dressing      What is the patient wearing?:  Incontinence brief, Pants     Lower body assist Assist for lower body dressing: Maximal Assistance - Patient 25 - 49%     Toileting Toileting Toileting Activity did not occur (Clothing management and hygiene only): N/A (no void or bm)  Toileting assist Assist for toileting: Maximal Assistance - Patient 25 - 49%     Transfers Chair/bed transfer  Transfers assist  Chair/bed transfer activity did not occur: Safety/medical concerns  Chair/bed transfer assist level: Minimal Assistance - Patient > 75%     Locomotion Ambulation   Ambulation assist      Assist level: Moderate Assistance - Patient 50 - 74% Assistive device: Hand held assist Max distance: 50   Walk 10 feet activity   Assist     Assist level: Moderate Assistance - Patient - 50 - 74% Assistive device: No Device, Hand held assist   Walk 50 feet activity   Assist Walk 50 feet with 2 turns activity did not occur: Safety/medical concerns  Assist level: Moderate Assistance - Patient - 50 - 74% Assistive device: No Device, Hand held assist    Walk 150 feet activity   Assist Walk 150 feet activity did not occur: Safety/medical concerns         Walk 10 feet on uneven surface  activity   Assist Walk 10 feet on uneven surfaces activity did not occur: Safety/medical concerns(Per report)         Wheelchair     Assist   Type of Wheelchair: Manual    Wheelchair assist level: Minimal Assistance - Patient > 75% Max wheelchair distance: 168ft    Wheelchair 50 feet with 2 turns activity    Assist        Assist Level: Minimal Assistance - Patient > 75%   Wheelchair 150 feet activity     Assist      Assist Level: Moderate Assistance - Patient 50 - 74%   Blood pressure (!) 149/68, pulse 67, temperature 97.6 F (36.4 C), resp. rate 18, height 6' (1.829 m), weight 94.4 kg, SpO2 96 %.  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 Antithrombotics: -DVT/anticoagulation:Pharmaceutical:Heparin -antiplatelet therapy: ASA/PLAVIX X 3 weeks followed by ASA alone. 3. Pain Management:tylenol prn 4. Mood:LCSW to follow for evaluation and support. -antipsychotic agents: N/A 5. Neuropsych: This patientiscapable of making decisions on hisown behalf. 6. Skin/Wound Care:Routine pressure relief measures. 7. Fluids/Electrolytes/Nutrition:Monitor I/O. Has been refusing nectar liquids. Push PO 8. T2DM with neuropathy/nephropathy: Hgb A1c- Was on ozempic, Lantus, Humalog and glucotrol PTA.            CBG (last 3)  Recent Labs    03/26/19 1609 03/26/19 2057 03/27/19 0625  GLUCAP 191* 159* 162*    9. ESRD- on PD at nights.Will convert to HD due to SNF placement, Nephro determining HD access 10. Hypokalemia/Hypomagnesemia: supps per nephro. -  nephro ordered KCL 76meq TID 11. Anemia of chronic disease: On aranesp weekly. Hgb 7.9- stable  12. Chronic diarrhea:Continue creon tid with meals.   -no diarrhea currently 14.HTN: Monitor BP tid--on metoprolol, losartan and low dose amlodipine             1/24 fair control 15. Chronic dizziness/Orthostatic changes?:  no dizziness reported   -will check orthostatic VS x 2 days 16. Prolonged QTC: QTC 528 on EKG 1/14 17. Lethargy: intermittent- likely CVA releated       LOS: 7 days A FACE TO FACE EVALUATION WAS PERFORMED  Kyle [name] 03/27/2019, 8:28 AM

## 2019-03-27 NOTE — Progress Notes (Signed)
Occupational Therapy Session Note  Patient Details  Name: Kyle [name] MRN: [id number] Date of Birth: 12-19-1950  Today's Date: 03/27/2019 OT Individual Time: 9450-3888 OT Individual Time Calculation (min): 72 min   Short Term Goals: Week 1:  OT Short Term Goal 1 (Week 1): Pt will complete toilet transfer with 1 assist and LRAD OT Short Term Goal 2 (Week 1): Pt will don overhead shirt with Min A OT Short Term Goal 3 (Week 1): Pt will complete LB self care with Mod A for dynamic standing balance  Skilled Therapeutic Interventions/Progress Updates:    Pt greeted in bed with no c/o pain, sleepy but not yet asleep. Requesting to use the toilet. Steady assist for supine<sit. Mod A for sit<stand using RW with heavy Rt lean. Pt attempting to step towards w/c with OT manually facilitating sitting back down to prevent fall. Pt with poor awareness that he was actively losing his balance. Mod-Max A stand pivot<w/c.  Mod A for stand pivot<toilet using grab bar. Mod A for standing balance while pt completed clothing mgt. He had continent B+B void. Pt completed 75% of hygiene himself with balance assist in standing. Note that in time pt with increased hip and knee flexion due to limited standing endurance. Pt needed A for thoroughness and also for donning brief. He completed bathing/dressing tasks sit<stand at the sink after. Mod A for dynamic sitting due to Rt lean and decreased awareness, manual facilitation and vcs for mirror attendance provided to improve this. Mod A for sit<stand for OT to elevate pants over hips. When pt initially attempted clothing mgt himself, he had to sit back down several times before this task was finished due to fatigue. Min A for donning overhead shirt in sitting. Vcs for sequencing, safety, ST memory, sustained attention, and carryover of hemi strategies throughout session. Pt very verbose and needs frequent cues for redirection to task. Pt remained in w/c at end of session,  left him with safety belt fastened and all needs within reach. Tx focus on Rt NMR, R UE coordination, functional transfers, sitting/standing balance, midline orientation, functional cognition, and ADL retraining.   Therapy Documentation Precautions:  Precautions Precautions: Fall Restrictions Weight Bearing Restrictions: No ADL: ADL Eating: Not assessed Grooming: Maximal assistance Where Assessed-Grooming: Edge of bed Upper Body Bathing: Minimal assistance Where Assessed-Upper Body Bathing: Edge of bed Lower Body Bathing: Maximal assistance Where Assessed-Lower Body Bathing: Edge of bed Upper Body Dressing: Moderate assistance Where Assessed-Upper Body Dressing: Edge of bed Lower Body Dressing: Maximal assistance Where Assessed-Lower Body Dressing: Edge of bed Toileting: Not assessed Toilet Transfer: Not assessed Tub/Shower Transfer: Not assessed      Therapy/Group: Individual Therapy  Brittanee Ghazarian A Mclain Freer 03/27/2019, 3:52 PM

## 2019-03-27 NOTE — Progress Notes (Signed)
[facility] ROUNDING NOTE   Subjective:   This is a 69 year old gentleman with a history of diabetes mellitus type 2 end-stage renal disease on peritoneal dialysis seizure disorder COPD morbid obesity gait disorder with falls chronic diarrhea admitted to [facility] on 03/12/2019 with a history of significant hypercalcemia confusion and weakness.  And having missed dialysis for 3 days.  MRI of the brain for left-sided ptosis revealed an acute left paramedian pons and old infarcts in the left paramedian pons chronic  small vessel disease.  Ejection fraction 65 to 16% grade 1 diastolic dysfunction carotid Dopplers negative for ICA stenosis.  He continues on peritoneal dialysis.  He is using all 1.5% dextrose with successful ultrafiltration.  Patient for placement in skilled nursing facility will need to transition to intermittent hemodialysis will arrange placement of vascular access.  Will consult with vein and vascular surgery.  Blood pressure 149/68 pulse 67 temperature 97.6 O2 sats 96% room   Sodium 137 potassium 4.6 chloride 102 CO2 24 BUN 39 creatinine 9.05 glucose 170 calcium 8.9 phosphorus of 4.7 albumin 1.9.  Last hemoglobin 7.9  Amlodipine 2.5 mg daily, aspirin 81 mg daily Lipitor 80 mg daily   PhosLo 1.3 g 3 times daily, Plavix 75 mg daily gabapentin 100 mg twice daily, Lantus 20 units daily Keppra 500 mg twice daily Synthroid 200 mcg daily Creon 24,000 units 3 times daily, metoprolol 50 mg daily multivitamins 1 daily, provigil 100mg  daily.  Darbepoetin ordered 25 mcg q. Sunday, amlodipine 5 mg daily  Objective:  Vital signs in last 24 hours:  Temp:  [97.5 F (36.4 C)-98.3 F (36.8 C)] 97.6 F (36.4 C) (01/29 0533) Pulse Rate:  [67-74] 67 (01/29 0533) Resp:  [18-20] 18 (01/29 0533) BP: (149-164)/(62-75) 149/68 (01/29 0533) SpO2:  [96 %-99 %] 96 % (01/29 0533)  Weight change:  Filed Weights   03/24/19 0500 03/24/19 1813 03/25/19 1925  Weight:  88.2 kg 95 kg 94.4 kg    Intake/Output: I/O last 3 completed shifts: In: 12975 [P.O.:480; SAYTK:16010] Out: 93235 [Other:13091]   Intake/Output this shift:  Total I/O In: 240 [P.O.:240] Out: -   TDD:UKGURKYHCWC ill, NAD BJS:EGBT eye ptosis EYES:EOMI DV:VOHYWVP, normal S1 and S2 PULM:Clear bilaterally, normal work of breathing, XTG:GYIR, nontender. Bowel sounds present. PD catheter in left upper quadrant, bandaged, deferred on evaluation of exit site SKIN:No rashes or lesions EXT:No significant edema   Basic Metabolic Panel: Recent Labs  Lab 03/21/19 0946 03/21/19 0946 03/23/19 0707 03/23/19 0707 03/25/19 0945 03/26/19 0530 03/27/19 0526  NA 142  --  142  --  139 141 137  K 3.3*  --  3.0*  --  4.8 5.0 4.6  CL 99  --  99  --  103 100 102  CO2 28  --  27  --  24 26 24   GLUCOSE 184*  --  236*  --  194* 168* 170*  BUN 39*  --  37*  --  34* 35* 39*  CREATININE 8.75*  --  8.94*  --  8.90* 8.83* 9.05*  CALCIUM 8.9   < > 8.6*   < > 8.5* 9.1 8.9  MG 1.8  --   --   --   --   --   --   PHOS  --   --   --   --  4.1 3.8 4.7*   < > = values in this interval not displayed.    Liver Function Tests: Recent Labs  Lab 03/21/19  1191 03/23/19 0707 03/25/19 0945 03/26/19 0530 03/27/19 0526  AST 16 14*  --   --   --   ALT 11 13  --   --   --   ALKPHOS 79 72  --   --   --   BILITOT 0.7 <0.1*  --   --   --   PROT 5.1* 5.2*  --   --   --   ALBUMIN 2.0* 1.9* 2.1* 2.0* 1.9*   No results for input(s): LIPASE, AMYLASE in the last 168 hours. No results for input(s): AMMONIA in the last 168 hours.  CBC: Recent Labs  Lab 03/23/19 0707  WBC 8.0  HGB 7.9*  HCT 24.7*  MCV 102.5*  PLT 228    Cardiac Enzymes: No results for input(s): CKTOTAL, CKMB, CKMBINDEX, TROPONINI in the last 168 hours.  BNP: Invalid input(s): POCBNP  CBG: Recent Labs  Lab 03/26/19 0609 03/26/19 1130 03/26/19 1609 03/26/19 2057 03/27/19 0625  GLUCAP 156* 244* 191* 159* 162*     Microbiology: Results for orders placed or performed during the hospital encounter of 03/20/19  Culture, Urine     Status: None   Collection Time: 03/22/19  4:15 PM   Specimen: Urine, Catheterized  Result Value Ref Range Status   Specimen Description URINE, CATHETERIZED  Final   Special Requests SITE NOT SPECIFIED  Final   Culture   Final    NO GROWTH Performed at [facility], [place] [address] [place], [place] [address]    Report Status 03/23/2019 FINAL  Final    Coagulation Studies: No results for input(s): LABPROT, INR in the last 72 hours.  Urinalysis: No results for input(s): COLORURINE, LABSPEC, PHURINE, GLUCOSEU, HGBUR, BILIRUBINUR, KETONESUR, PROTEINUR, UROBILINOGEN, NITRITE, LEUKOCYTESUR in the last 72 hours.  Invalid input(s): APPERANCEUR    Imaging: CT HEAD WO CONTRAST  Result Date: 03/26/2019 CLINICAL DATA:  Follow-up stroke.  Found on the floor. EXAM: CT HEAD WITHOUT CONTRAST TECHNIQUE: Contiguous axial images were obtained from the base of the skull through the vertex without intravenous contrast. COMPARISON:  MRI 03/15/2019.  CT 03/17/2019. FINDINGS: Brain: Low-density remains visible at the site of recent infarction of the left paramedian mid brain. Old infarction of the left pons as seen previously. No evidence of hemorrhage or swelling. Old small vessel cerebellar infarctions. Chronic small-vessel ischemic changes of the cerebral hemispheric white matter. No sign of additional infarction. No hydrocephalus or extra-axial collection. Vascular: There is atherosclerotic calcification of the major vessels at the base of the brain. Skull: Negative Sinuses/Orbits: Chronic opacification of the left maxillary sinus with mucoid material. Fungal sinusitis not excluded. Other: None IMPRESSION: No new finding. Of all vein changes of recent infarction of the left paramedian mid brain. No hemorrhage. Old ischemic changes elsewhere as noted above. Left maxillary sinus disease  which could be chronic sinusitis with inspissated mucus or fungal sinusitis. Electronically Signed   By: Nelson Chimes M.D.   On: 03/26/2019 09:17     Medications:   . dialysis solution 1.5% low-MG/low-CA    . dialysis solution 1.5% low-MG/low-CA    . ferric gluconate (FERRLECIT/NULECIT) IV 125 mg (03/27/19 0825)   . amLODipine  5 mg Oral Daily  . aspirin EC  81 mg Oral Daily  . atorvastatin  80 mg Oral Daily  . calcium acetate  1,334 mg Oral TID WC  . clopidogrel  75 mg Oral Daily  . [START ON 03/29/2019] darbepoetin (ARANESP) injection - NON-DIALYSIS  25 mcg Subcutaneous Q  Sun-1800  . feeding supplement (NEPRO CARB STEADY)  237 mL Oral BID BM  . gabapentin  100 mg Oral BID  . gentamicin cream  1 application Topical Daily  . heparin  5,000 Units Subcutaneous Q8H  . insulin aspart  0-15 Units Subcutaneous TID WC  . insulin glargine  30 Units Subcutaneous Daily  . levETIRAcetam  500 mg Oral BID  . levothyroxine  200 mcg Oral q morning - 10a  . lipase/protease/amylase  24,000 Units Oral TID WC  . metoprolol succinate  50 mg Oral Daily  . modafinil  100 mg Oral Daily  . multivitamin  1 tablet Oral Daily   acetaminophen, bisacodyl, guaiFENesin-dextromethorphan, dianeal solution for CAPD/CCPD with heparin, polyethylene glycol, prochlorperazine **OR** prochlorperazine **OR** prochlorperazine, Resource ThickenUp Clear  Assessment/ Plan:   ESRD-peritoneal dialysis diabetic nephropathy uses CCPD 2.5 L with 5 exchanges no pauses.  Continues on 1.5% bags with good ultrafiltration  Anemia iron saturation 20% have started IV Ferrlecit and administering darbepoetin 25 mcg weekly to be administered 03/29/2019  History of hypokalemia.  Potassium supplementation.  We will continue to follow  Bone mineral continue binders vitamin D.  Continues on PhosLo 2 with meals. vitamin D discontinued due to hypercalcemia at this point.  PTH 91.  Chronic pancreatitis continues on pancreatic enzyme  supplementation  Status post pontine stroke continues on Plavix and aspirin  Hypertension/volume continues to ultrafilter well.  Losartan discontinued 03/23/2019.  Will increase amlodipine to 5 mg daily  Diabetes mellitus as per primary team  Hypothyroidism on replacement therapy  Vascular access will consult vein and vascular surgery for placement of tunneled dialysis catheter as well as arrangements for permanent access placement.     LOS: [place] @TODAY @9 :13 AM

## 2019-03-27 NOTE — Progress Notes (Addendum)
Speech Language Pathology Weekly Progress and Session Note  Patient Details  Name: Kyle [name] MRN: [id number] Date of Birth: [birth year]  Beginning of progress report period: March 22, 2018 End of progress report period: March 26, 2018  Today's Date: 03/27/2019 SLP Individual Time: 1100-1140 SLP Individual Time Calculation (min): 40 min  Short Term Goals: Week 1: SLP Short Term Goal 1 (Week 1): Pt wil consume dys 3 textures and thin liquids with minimal overt s/s aspiration and min A verbal cues for use of swallow startegies ( second swallow.) SLP Short Term Goal 1 - Progress (Week 1): Not met SLP Short Term Goal 2 (Week 1): Pt will demonstrate use of speech intelligibility strategies (over articulation and slow rate) at sentence level for 90% intelligibility with min A verbal cues. SLP Short Term Goal 2 - Progress (Week 1): Not met SLP Short Term Goal 3 (Week 1): Pt will demonstrate sustained attention in 15 minute intervals with min A verbal cues for redirection during functional tasks. SLP Short Term Goal 3 - Progress (Week 1): Not met SLP Short Term Goal 4 (Week 1): Pt will demonstrate intellectual awareness of 2 cognitive/swallow/speech deficits and 2 physical deficits with mod A verbal cues. SLP Short Term Goal 4 - Progress (Week 1): Not met SLP Short Term Goal 5 (Week 1): Pt will complete functional and basic problem solving tasks with min A verbal cues. SLP Short Term Goal 5 - Progress (Week 1): Not met SLP Short Term Goal 6 (Week 1): Pt will demonstrate recall of novel and daily information with min A verbal cues for compensatory startegies. SLP Short Term Goal 6 - Progress (Week 1): Not met    New Short Term Goals: Week 2: SLP Short Term Goal 1 (Week 2): Pt will consume current diet with minimal overt s/s of aspiration/dysphagia and Min A cues for use of safe swallow strategies. SLP Short Term Goal 2 (Week 2): Pt will demonstrate use of speech intelligibility  strategies (over articulation and slow rate) at sentence level for 90% intelligibility with min A verbal cues. SLP Short Term Goal 3 (Week 2): Pt will demonstrate sustained attention in 15 minute intervals with min A verbal cues for redirection during functional tasks. SLP Short Term Goal 4 (Week 2): Pt will answer yes/no quesitons to demonstrate intellectual awareness of cognition/communication/swallow deficits and physical deficits in 5 out of 10 opportunities with Min A cues. SLP Short Term Goal 5 (Week 2): Pt will complete functional and basic problem solving tasks with min A verbal cues. SLP Short Term Goal 6 (Week 2): Pt will utilize external compensatory memory strategies to recall information in 5 out of 10 opportunities with Min A cues.  Weekly Progress Updates:  Pt has made slow progress this reporting period and continues to require moderate assistance for sustained attention, awareness of deficits, recall of daily information as well as basic problem solving tasks. Pt's speech intelligibility continues to fluctuate around ~ 75% intelligible at the sentence level with Moderate cues. Pt's LTGs have been downgraded to reflect slower than expected. Skilled ST continues to be indicated to increase pt's functional independence and reduce caregiver burden. At this time SNF is recommended for ongoing therapy.      Intensity: Minumum of 1-2 x/day, 30 to 90 minutes Frequency: 3 to 5 out of 7 days Duration/Length of Stay: pending SNF placement Treatment/Interventions: Cognitive remediation/compensation;Cueing hierarchy;Dysphagia/aspiration precaution training;Functional tasks;Internal/external aids;Patient/family education   Daily Session  Skilled Therapeutic Interventions:   Skilled treatment session targeted  cognition and dysphagia goals. Pt had spilled this pitcher of ice onto floor without problem solving ability to press call light. While SLP cleaned up the floor, pt was perseverative  comments including how the "dogs (on his pajama pants) were running around. When consuming thin water via straw, pt with cough. He was able to recall need for dry swallow but when pt attempted, he required more than a reasonable time to produce dry swallow. Pt was very tangential throughout session and was largely not responsive to cues to increase speech intelligibility and made off-topic comments throughout. Pt left upright in wheelchair, lap belt alarm on and all needs within reach. Continue per current plan of care.     General    Pain Pain Assessment Pain Scale: 0-10 Pain Score: 0-No pain  Therapy/Group: Individual Therapy  Zakir Henner 03/27/2019, 11:29 AM

## 2019-03-27 NOTE — Consult Note (Addendum)
[facility] NOTE   MRN : [id number]  Reason for Consult: ESRD transition from peritoneal dialysis to HD Referring Physician: Dr Edrick Oh  History of Present Illness: This is a 69 year old gentleman with a history of diabetes mellitus type 2 end-stage renal disease on peritoneal dialysis seizure disorder COPD morbid obesity gait disorder with falls chronic diarrhea.  Admitted with confusion and weakness.  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  Negative ICA stenosis.  He has been in [place] since 03/20/19 for rehab.     We have been asked to provide [facility] and permanent access to start HD for transition to intermittent hemodialysis for SNF.  He denise chest wall implants.        Current Facility-Administered Medications  Medication Dose Route Frequency Provider Last Rate Last Admin  . acetaminophen (TYLENOL) tablet 325-650 mg  325-650 mg Oral Q4H PRN Bary Leriche, PA-C   650 mg at 03/26/19 2206  . amLODipine (NORVASC) tablet 5 mg  5 mg Oral Daily Edrick Oh, MD   5 mg at 03/27/19 2878  . aspirin EC tablet 81 mg  81 mg Oral Daily Bary Leriche, PA-C   81 mg at 03/27/19 6767  . atorvastatin (LIPITOR) tablet 80 mg  80 mg Oral Daily Bary Leriche, PA-C   80 mg at 03/26/19 1811  . bisacodyl (DULCOLAX) suppository 10 mg  10 mg Rectal Daily PRN Love, Pamela S, PA-C      . calcium acetate (PHOSLO) capsule 1,334 mg  1,334 mg Oral TID WC LoveIvan Anchors, PA-C   1,334 mg at 03/27/19 2094  . clopidogrel (PLAVIX) tablet 75 mg  75 mg Oral Daily Bary Leriche, PA-C   75 mg at 03/27/19 7096  . [START ON 03/29/2019] Darbepoetin Alfa (ARANESP) injection 25 mcg  25 mcg Subcutaneous Q Sun-1800 Edrick Oh, MD      . dialysis solution 1.5% low-MG/low-CA dianeal solution   Intraperitoneal Q24H Pearson Grippe B, MD   5,000 mL at 03/25/19 1937  . dialysis solution 1.5% low-MG/low-CA dianeal  solution   Intraperitoneal Q24H Love, Pamela S, PA-C      . feeding supplement (NEPRO CARB STEADY) liquid 237 mL  237 mL Oral BID BM Love, Pamela S, PA-C   237 mL at 03/27/19 1000  . ferric gluconate (NULECIT) 125 mg in sodium chloride 0.9 % 100 mL IVPB  125 mg Intravenous Daily Pearson Grippe B, MD 110 mL/hr at 03/27/19 0825 125 mg at 03/27/19 0825  . gabapentin (NEURONTIN) capsule 100 mg  100 mg Oral BID Bary Leriche, PA-C   100 mg at 03/27/19 0813  . gentamicin cream (GARAMYCIN) 0.1 % 1 application  1 application Topical Daily Rexene Agent, MD   1 application at 28/36/62 0813  . guaiFENesin-dextromethorphan (ROBITUSSIN DM) 100-10 MG/5ML syrup 5-10 mL  5-10 mL Oral Q6H PRN Love, Pamela S, PA-C      . heparin 2,500 Units in dialysis solution 1.5% low-MG/low-CA 5,000 mL dialysis solution   Peritoneal Dialysis PRN Rexene Agent, MD      . heparin injection 5,000 Units  5,000 Units Subcutaneous Q8H Bary Leriche, PA-C   5,000 Units at 03/27/19 [phone]  . insulin aspart (novoLOG) injection 0-15 Units  0-15 Units Subcutaneous TID WC Bary Leriche, PA-C   3 Units at 03/27/19 5465  . insulin glargine (LANTUS) injection 30 Units  30  Units Subcutaneous Daily [name], MD   30 Units at 03/27/19 0813  . levETIRAcetam (KEPPRA) 100 MG/ML solution 500 mg  500 mg Oral BID [name], MD   500 mg at 03/27/19 8469  . levothyroxine (SYNTHROID) tablet 200 mcg  200 mcg Oral q morning - 10a Love, Ivan Anchors, PA-C   200 mcg at 03/27/19 6295  . lipase/protease/amylase (CREON) capsule 24,000 Units  24,000 Units Oral TID WC Bary Leriche, PA-C   24,000 Units at 03/27/19 2841  . metoprolol succinate (TOPROL-XL) 24 hr tablet 50 mg  50 mg Oral Daily Bary Leriche, PA-C   50 mg at 03/27/19 3244  . modafinil (PROVIGIL) tablet 100 mg  100 mg Oral Daily [name], MD   100 mg at 03/27/19 0102  . multivitamin (RENA-VIT) tablet 1 tablet  1 tablet Oral Daily [name], MD   1 tablet at  03/27/19 [phone]  . polyethylene glycol (MIRALAX / GLYCOLAX) packet 17 g  17 g Oral Daily PRN Love, Pamela S, PA-C      . prochlorperazine (COMPAZINE) tablet 5-10 mg  5-10 mg Oral Q6H PRN Love, Pamela S, PA-C       Or  . prochlorperazine (COMPAZINE) injection 5-10 mg  5-10 mg Intramuscular Q6H PRN Love, Pamela S, PA-C       Or  . prochlorperazine (COMPAZINE) suppository 12.5 mg  12.5 mg Rectal Q6H PRN Love, Ivan Anchors, PA-C      . Resource ThickenUp Clear   Oral PRN Meredith Staggers, MD        Pt meds include: Statin :Yes Betablocker: Yes ASA: Yes Other anticoagulants/antiplatelets: none  Past Medical History:  Diagnosis Date  . Anxiety   . BPH (benign prostatic hyperplasia)   . Chronic kidney disease   . Colon polyps   . COPD (chronic obstructive pulmonary disease) ([place])   . Depression   . Diabetes mellitus without complication ([place])   . Diverticulosis of colon   . Hyperlipidemia   . Hypertension   . Hypothyroidism   . Nephrolithiasis   . Seizures ([place])     Past Surgical History:  Procedure Laterality Date  . COLONOSCOPY    . COLONOSCOPY WITH PROPOFOL N/A 05/30/2015   Procedure: COLONOSCOPY WITH PROPOFOL;  Surgeon: Manya Silvas, MD;  Location[facility] END[facility]COPY;  Service: Endoscopy;  Laterality: N/A;  . COLONOSCOPY WITH PROPOFOL N/A 12/01/2018   Procedure: COLONOSCOPY WITH PROPOFOL;  Surgeon: Toledo, [name], MD;  Location: [facility] ENDOSCOPY;  Service: Gastroenterology;  Laterality: N/A;  . kidney stone    . THYROIDECTOMY    . VARICOCELE EXCISION    . VIDEO BRONCHOSCOPY Bilateral 05/25/2016   Procedure: VIDEO BRONCHOSCOPY WITHOUT FLUORO;  Surgeon: Juanito Doom, MD;  Location[facility] ENDOSCOPY;  Service: Cardiopulmonary;  Laterality: Bilateral;    Social History Social History   Tobacco Use  . Smoking status: Light Tobacco Smoker    Packs/day: 1.00    Years: 40.00    Pack years: 40.00    Types: Cigarettes    Last attempt to quit: 05/25/2016    Years since quitting:  2.8  . Smokeless tobacco: Never Used  Substance Use Topics  . Alcohol use: Yes    Alcohol/week: 8.0 standard drinks    Types: 8 Cans of beer per week    Comment: beer or wine   . Drug use: No    Family History Family History  Problem Relation Age of Onset  . Diabetes Mother   .  Diabetes Maternal Grandmother   . Diabetes Maternal Grandfather   . Lung cancer Father   . Emphysema Paternal Grandfather     No Known Allergies   REVIEW OF SYSTEMS  General: [ ]  Weight loss, [ ]  Fever, [ ]  chills Neurologic: [ ]  Dizziness, [ ]  Blackouts, [ ]  Seizure [x ] Stroke, [ ]  "Mini stroke", [ ]  Slurred speech, [ ]  Temporary blindness; [ ]  weakness in arms or legs, [ ]  Hoarseness [ ]  Dysphagia Cardiac: [ ]  Chest pain/pressure, [ ]  Shortness of breath at rest [ ]  Shortness of breath with exertion, [ ]  Atrial fibrillation or irregular heartbeat  Vascular: [ ]  Pain in legs with walking, [ ]  Pain in legs at rest, [ ]  Pain in legs at night,  [ ]  Non-healing ulcer, [ ]  Blood clot in vein/DVT,   Pulmonary: [ ]  Home oxygen, [ ]  Productive cough, [ ]  Coughing up blood, [ ]  Asthma,  [ ]  Wheezing [ ]  COPD Musculoskeletal:  [ ]  Arthritis, [ ]  Low back pain, [ ]  Joint pain Hematologic: [ ]  Easy Bruising, [ ]  Anemia; [ ]  Hepatitis Gastrointestinal: [ ]  Blood in stool, [ ]  Gastroesophageal Reflux/heartburn, Urinary: [ ]  chronic Kidney disease, [ ]  on HD - [ ]  MWF or [ ]  TTHS, [ ]  Burning with urination, [ ]  Difficulty urinating [x]  peritoneal dialysis Skin: [ ]  Rashes, [ ]  Wounds Psychological: [ ]  Anxiety, [ ]  Depression  Physical Examination Vitals:   03/26/19 1538 03/26/19 1913 03/26/19 1920 03/27/19 0533  BP: (!) 159/75 (!) 157/67 (!) 164/62 (!) 149/68  Pulse: 72 74 73 67  Resp: 20 18 18 18   Temp: (!) 97.5 F (36.4 C) 98.3 F (36.8 C) 98.3 F (36.8 C) 97.6 F (36.4 C)  TempSrc:   Oral   SpO2: 99% 97%  96%  Weight:      Height:       Body mass index is 28.23 kg/m.  General:  WDWN in  NAD HENT: WNL Eyes: Pupils equal, left eye ptosis  Pulmonary: normal non-labored breathing , without Rales, rhonchi,  wheezing Cardiac: RRR, without  Murmurs, rubs or gallops; No carotid bruits Abdomen: soft, NT, no masses Skin: no rashes, ulcers noted;  no Gangrene , no cellulitis; no open wounds;   Vascular Exam/Pulses:palpable radial and brachial pulses    Musculoskeletal: no muscle wasting or atrophy; no edema  Neurologic: A&O X 3; Appropriate Affect ;  SENSATION: normal; MOTOR FUNCTION: 5/5 Symmetric B UE grip Speech is fluent/normal   Significant Diagnostic Studies: CBC Lab Results  Component Value Date   WBC 8.0 03/23/2019   HGB 7.9 (L) 03/23/2019   HCT 24.7 (L) 03/23/2019   MCV 102.5 (H) 03/23/2019   PLT 228 03/23/2019    BMET    Component Value Date/Time   NA 137 03/27/2019 0526   NA 138 08/08/2013 1947   K 4.6 03/27/2019 0526   K 3.9 08/08/2013 1947   CL 102 03/27/2019 0526   CL 107 08/08/2013 1947   CO2 24 03/27/2019 0526   CO2 21 08/08/2013 1947   GLUCOSE 170 (H) 03/27/2019 0526   GLUCOSE 100 (H) 08/08/2013 1947   BUN 39 (H) 03/27/2019 0526   BUN 32 (H) 08/08/2013 1947   CREATININE 9.05 (H) 03/27/2019 0526   CREATININE 2.04 (H) 08/08/2013 1947   CALCIUM 8.9 03/27/2019 0526   CALCIUM 9.1 08/08/2013 1947   GFRNONAA 5 (L) 03/27/2019 0526   GFRNONAA 33 (L) 08/08/2013 1947   GFRAA  6 (L) 03/27/2019 0526   GFRAA 38 (L) 08/08/2013 1947   Estimated Creatinine Clearance: 9.1 mL/min (A) (by C-G formula based on SCr of 9.05 mg/dL (H)).  COAG Lab Results  Component Value Date   INR 0.9 09/17/2018     Non-Invasive Vascular Imaging:  Pending vein mapping  ASSESSMENT/PLAN:  ESRD on PD Pending SNF placement he needs [facility] and permanent access. He is right hand dominant.  I have ordered vein mapping.     Roxy Horseman 03/27/2019 12:36 PM  I have independently interviewed and examined patient and agree with PA assessment and plan above. Will  f/u vein mapping and plan for tdc and upper extremity access early next week.  Gracynn Rajewski C. Donzetta Matters, MD [facility] [place] Office: [phone] Pager: [phone]

## 2019-03-27 NOTE — Plan of Care (Signed)
  Problem: RH Swallowing Goal: LTG Patient will consume least restrictive diet using compensatory strategies with assistance (SLP) Description: LTG:  Patient will consume least restrictive diet using compensatory strategies with assistance (SLP) Flowsheets (Taken 03/27/2019 1254) LTG: Pt Patient will consume least restrictive diet using compensatory strategies with assistance of (SLP): Minimal Assistance - Patient > 75% Note: Downgraded d/t slow progress Goal: LTG Patient will participate in dysphagia therapy to increase swallow function with assistance (SLP) Description: LTG:  Patient will participate in dysphagia therapy to increase swallow function with assistance (SLP) Flowsheets (Taken 03/27/2019 1254) LTG: Pt will participate in dysphagia therapy to increase swallow function with assistance of (SLP): Supervision Goal: LTG Pt will demonstrate functional change in swallow as evidenced by bedside/clinical objective assessment (SLP) Description: LTG: Patient will demonstrate functional change in swallow as evidenced by bedside/clinical objective assessment (SLP) Flowsheets (Taken 03/27/2019 1254) LTG: Patient will demonstrate functional change in swallow as evidenced by bedside/clinical objective assessment: Oropharyngeal swallow   Problem: RH Expression Communication Goal: LTG Patient will increase speech intelligibility (SLP) Description: LTG: Patient will increase speech intelligibility at word/phrase/conversation level with cues, % of the time (SLP) Flowsheets (Taken 03/27/2019 1254) LTG: Patient will increase speech intelligibility (SLP): Moderate Assistance - Patient 50 - 74% Level: (sentence level) -- Percent of time patient will use intelligible speech: 75% Note: Downgraded d/t slow progress   Problem: RH Problem Solving Goal: LTG Patient will demonstrate problem solving for (SLP) Description: LTG:  Patient will demonstrate problem solving for basic/complex daily situations with cues   (SLP) Flowsheets (Taken 03/27/2019 1254) LTG Patient will demonstrate problem solving for: Moderate Assistance - Patient 50 - 74% Note: Downgraded d/t slow progress   Problem: RH Memory Goal: LTG Patient will demonstrate ability for day to day (SLP) Description: LTG:   Patient will demonstrate ability for day to day recall/carryover during cognitive/linguistic activities with assist  (SLP) Flowsheets (Taken 03/27/2019 1254) LTG: Patient will demonstrate ability for day to day recall: New information LTG: Patient will demonstrate ability for day to day recall/carryover during cognitive/linguistic activities with assist (SLP): Moderate Assistance - Patient 50 - 74% Note: Downgraded d/t slow progress   Problem: RH Attention Goal: LTG Patient will demonstrate this level of attention during functional activites (SLP) Description: LTG:  Patient will will demonstrate this level of attention during functional activites (SLP) Flowsheets (Taken 03/27/2019 1254) LTG: Patient will demonstrate this level of attention during cognitive/linguistic activities with assistance of (SLP): Moderate Assistance - Patient 50 - 74% Number of minutes patient will demonstrate attention during cognitive/linguistic activities: 15 Note: Downgraded d/t slow progress   Problem: RH Awareness Goal: LTG: Patient will demonstrate awareness during functional activites type of (SLP) Description: LTG: Patient will demonstrate awareness during functional activites type of (SLP) Flowsheets Taken 03/27/2019 1254 by Rutherford Nail, Brice Potteiger, CCC-SLP LTG: Patient will demonstrate awareness during cognitive/linguistic activities with assistance of (SLP): Moderate Assistance - Patient 50 - 74% Taken 03/21/2019 1636 by Charolett Bumpers, [place] Patient will demonstrate during cognitive/linguistic activities awareness type of: Intellectual Note: Downgraded d/t slow progress

## 2019-03-28 ENCOUNTER — Inpatient Hospital Stay (HOSPITAL_COMMUNITY): Payer: Medicare PPO

## 2019-03-28 ENCOUNTER — Inpatient Hospital Stay (HOSPITAL_COMMUNITY): Payer: Medicare PPO | Admitting: Speech Pathology

## 2019-03-28 ENCOUNTER — Inpatient Hospital Stay (HOSPITAL_COMMUNITY): Payer: Medicare PPO | Admitting: Physical Therapy

## 2019-03-28 DIAGNOSIS — Z01810 Encounter for preprocedural cardiovascular examination: Secondary | ICD-10-CM

## 2019-03-28 LAB — RENAL FUNCTION PANEL
Albumin: 1.9 g/dL — ABNORMAL LOW (ref 3.5–5.0)
Anion gap: 13 (ref 5–15)
BUN: 40 mg/dL — ABNORMAL HIGH (ref 8–23)
CO2: 25 mmol/L (ref 22–32)
Calcium: 8.7 mg/dL — ABNORMAL LOW (ref 8.9–10.3)
Chloride: 98 mmol/L (ref 98–111)
Creatinine, Ser: 8.68 mg/dL — ABNORMAL HIGH (ref 0.61–1.24)
GFR calc Af Amer: 6 mL/min — ABNORMAL LOW (ref >60)
GFR calc non Af Amer: 6 mL/min — ABNORMAL LOW (ref >60)
Glucose, Bld: 214 mg/dL — ABNORMAL HIGH (ref 70–99)
Phosphorus: 4.9 mg/dL — ABNORMAL HIGH (ref 2.5–4.6)
Potassium: 4.6 mmol/L (ref 3.5–5.1)
Sodium: 136 mmol/L (ref 135–145)

## 2019-03-28 LAB — GLUCOSE, CAPILLARY
Glucose-Capillary: 225 mg/dL — ABNORMAL HIGH (ref 70–99)
Glucose-Capillary: 282 mg/dL — ABNORMAL HIGH (ref 70–99)
Glucose-Capillary: 227 mg/dL — ABNORMAL HIGH (ref 70–99)
Glucose-Capillary: 244 mg/dL — ABNORMAL HIGH (ref 70–99)

## 2019-03-28 NOTE — Progress Notes (Signed)
[facility] ROUNDING NOTE   Subjective:   This is a 69 year old gentleman with a history of diabetes mellitus type 2 end-stage renal disease on peritoneal dialysis seizure disorder COPD morbid obesity gait disorder with falls chronic diarrhea admitted to [facility] on 03/12/2019 with a history of significant hypercalcemia confusion and weakness.  And having missed dialysis for 3 days.  MRI of the brain for left-sided ptosis revealed an acute left paramedian pons and old infarcts in the left paramedian pons chronic  small vessel disease.  Ejection fraction 65 to 32% grade 1 diastolic dysfunction carotid Dopplers negative for ICA stenosis.  He continues on peritoneal dialysis.  He is using all 1.5% dextrose with successful ultrafiltration.  Patient for placement in skilled nursing facility will need to transition to intermittent hemodialysis will arrange placement of vascular access.  Appreciate assistance from vein and vascular surgery and Dr Siri Cole.  Blood pressure 142/57 pulse 71 temperature 97.7 O2 sats 98% room air  Sodium 136 potassium 4.6 chloride 98 CO2 25 BUN 40 creatinine 8.68 glucose 214 phosphorus 4.9 albumin 1.9 calcium 8.7.  Will reorder CBC for a.m. 03/29/2019  Amlodipine 5 mg daily, aspirin 81 mg daily Lipitor 80 mg daily   PhosLo 1.3 g 3 times daily, Plavix 75 mg daily gabapentin 100 mg twice daily, Lantus 20 units daily Keppra 500 mg twice daily Synthroid 200 mcg daily Creon 24,000 units 3 times daily, metoprolol 50 mg daily multivitamins 1 daily, provigil 100mg  daily.  Darbepoetin ordered 25 mcg q. Sunday, amlodipine 5 mg daily  Objective:  Vital signs in last 24 hours:  Temp:  [97.7 F (36.5 C)-98.3 F (36.8 C)] 97.7 F (36.5 C) (01/30 0547) Pulse Rate:  [67-74] 71 (01/30 0850) Resp:  [16-18] 18 (01/30 0547) BP: (140-144)/(57-69) 142/57 (01/30 0850) SpO2:  [97 %-100 %] 98 % (01/30 0547) Weight:  [94 kg-95.2 kg] 94 kg (01/30  0500)  Weight change:  Filed Weights   03/25/19 1925 03/27/19 1745 03/28/19 0500  Weight: 94.4 kg 95.2 kg 94 kg    Intake/Output: I/O last 3 completed shifts: In: 67124 [P.O.:820; PYKDX:83382] Out: 50539 [Other:12660]   Intake/Output this shift:  Total I/O In: 100 [P.O.:100] Out: -   JQB:HALPFXTKWIO ill, NAD XBD:ZHGD eye ptosis EYES:EOMI JM:EQASTMH, normal S1 and S2 PULM:Clear bilaterally, normal work of breathing, DQQ:IWLN, nontender. Bowel sounds present. PD catheter in left upper quadrant, bandaged, deferred on evaluation of exit site SKIN:No rashes or lesions EXT:No significant edema   Basic Metabolic Panel: Recent Labs  Lab 03/23/19 0707 03/23/19 0707 03/25/19 0945 03/25/19 0945 03/26/19 0530 03/27/19 0526 03/28/19 0657  NA 142  --  139  --  141 137 136  K 3.0*  --  4.8  --  5.0 4.6 4.6  CL 99  --  103  --  100 102 98  CO2 27  --  24  --  26 24 25   GLUCOSE 236*  --  194*  --  168* 170* 214*  BUN 37*  --  34*  --  35* 39* 40*  CREATININE 8.94*  --  8.90*  --  8.83* 9.05* 8.68*  CALCIUM 8.6*   < > 8.5*   < > 9.1 8.9 8.7*  PHOS  --   --  4.1  --  3.8 4.7* 4.9*   < > = values in this interval not displayed.    Liver Function Tests: Recent Labs  Lab 03/23/19 0707 03/25/19 0945 03/26/19 0530 03/27/19 0526 03/28/19  0657  AST 14*  --   --   --   --   ALT 13  --   --   --   --   ALKPHOS 72  --   --   --   --   BILITOT <0.1*  --   --   --   --   PROT 5.2*  --   --   --   --   ALBUMIN 1.9* 2.1* 2.0* 1.9* 1.9*   No results for input(s): LIPASE, AMYLASE in the last 168 hours. No results for input(s): AMMONIA in the last 168 hours.  CBC: Recent Labs  Lab 03/23/19 0707  WBC 8.0  HGB 7.9*  HCT 24.7*  MCV 102.5*  PLT 228    Cardiac Enzymes: No results for input(s): CKTOTAL, CKMB, CKMBINDEX, TROPONINI in the last 168 hours.  BNP: Invalid input(s): POCBNP  CBG: Recent Labs  Lab 03/27/19 0625 03/27/19 1136 03/27/19 1728  03/27/19 2150 03/28/19 0632  GLUCAP 162* 216* 144* 232* 225*    Microbiology: Results for orders placed or performed during the hospital encounter of 03/20/19  Culture, Urine     Status: None   Collection Time: 03/22/19  4:15 PM   Specimen: Urine, Catheterized  Result Value Ref Range Status   Specimen Description URINE, CATHETERIZED  Final   Special Requests SITE NOT SPECIFIED  Final   Culture   Final    NO GROWTH Performed at [name], [place] [address] [place], [place] [address]    Report Status 03/23/2019 FINAL  Final    Coagulation Studies: No results for input(s): LABPROT, INR in the last 72 hours.  Urinalysis: No results for input(s): COLORURINE, LABSPEC, PHURINE, GLUCOSEU, HGBUR, BILIRUBINUR, KETONESUR, PROTEINUR, UROBILINOGEN, NITRITE, LEUKOCYTESUR in the last 72 hours.  Invalid input(s): APPERANCEUR    Imaging: No results found.   Medications:   . dialysis solution 1.5% low-MG/low-CA    . dialysis solution 1.5% low-MG/low-CA    . ferric gluconate (FERRLECIT/NULECIT) IV 125 mg (03/27/19 0825)   . amLODipine  5 mg Oral Daily  . aspirin EC  81 mg Oral Daily  . atorvastatin  80 mg Oral Daily  . calcium acetate  1,334 mg Oral TID WC  . clopidogrel  75 mg Oral Daily  . [START ON 03/29/2019] darbepoetin (ARANESP) injection - NON-DIALYSIS  25 mcg Subcutaneous Q Sun-1800  . feeding supplement (NEPRO CARB STEADY)  237 mL Oral BID BM  . gabapentin  100 mg Oral BID  . gentamicin cream  1 application Topical Daily  . heparin  5,000 Units Subcutaneous Q8H  . insulin aspart  0-15 Units Subcutaneous TID WC  . insulin glargine  30 Units Subcutaneous Daily  . levETIRAcetam  500 mg Oral BID  . levothyroxine  200 mcg Oral q morning - 10a  . lipase/protease/amylase  24,000 Units Oral TID WC  . metoprolol succinate  50 mg Oral Daily  . modafinil  100 mg Oral Daily  . multivitamin  1 tablet Oral Daily   acetaminophen, bisacodyl, guaiFENesin-dextromethorphan,  dianeal solution for CAPD/CCPD with heparin, polyethylene glycol, prochlorperazine **OR** prochlorperazine **OR** prochlorperazine, Resource ThickenUp Clear  Assessment/ Plan:   ESRD-peritoneal dialysis diabetic nephropathy uses CCPD 2.5 L with 5 exchanges no pauses.  Continues on 1.5% bags with good ultrafiltration.  Patient now for placement at SNF where peritoneal dialysis will not be be able to be performed.  We will therefore transition to hemodialysis week of 03/30/2019  Anemia iron saturation 20%  have started IV Ferrlecit and administering darbepoetin 25 mcg weekly to be administered 03/29/2019  History of hypokalemia.  Potassium supplementation.  We will continue to follow  Bone mineral continue binders vitamin D.  Continues on PhosLo 2 with meals. vitamin D discontinued due to hypercalcemia at this point.  PTH 91.  Chronic pancreatitis continues on pancreatic enzyme supplementation  Status post pontine stroke continues on Plavix and aspirin  Hypertension/volume continues to ultrafilter well.  Losartan discontinued 03/23/2019.   amlodipine to 5 mg daily  Diabetes mellitus as per primary team  Hypothyroidism on replacement therapy  Vascular access appreciate assistance from Dr. Siri Cole for placement of tunneled dialysis catheter and fistula week of 03/30/2019.  Transition to SNF and hemodialysis week of 03/30/2019     LOS: [place] @TODAY @9 :55 AM

## 2019-03-28 NOTE — Progress Notes (Signed)
VASCULAR LAB PRELIMINARY  PRELIMINARY  PRELIMINARY  PRELIMINARY  Bilateral upper extremity vein mapping completed.    Preliminary report:  See CV proc for preliminary results.   Antone Summons, RVT 03/28/2019, 11:52 AM

## 2019-03-28 NOTE — Progress Notes (Signed)
[place] PHYSICAL MEDICINE & REHABILITATION PROGRESS NOTE   Subjective/Complaints: No complaints. Denies pain or constipation. Says he does not sleep well.  ROS: Patient denies nausea, vomiting, diarrhea, cough, shortness of breath or chest pain, joint or back pain, headache, or mood change.    Objective:   VAS [place] UPPER EXT VEIN MAPPING (PRE-OP AVF)  Result Date: 03/28/2019 UPPER EXTREMITY VEIN MAPPING  Indications: Pre dialysis access. History: ESRD.  Comparison Study: No prior Performing Technologist: Sharion Dove RVS  Examination Guidelines: A complete evaluation includes B-mode imaging, spectral Doppler, color Doppler, and power Doppler as needed of all accessible portions of each vessel. Bilateral testing is considered an integral part of a complete examination. Limited examinations for reoccurring indications may be performed as noted. +-----------------+-------------+----------+---------+ Right Cephalic   Diameter (cm)Depth (cm)Findings  +-----------------+-------------+----------+---------+ Shoulder             0.31        0.17             +-----------------+-------------+----------+---------+ Prox upper arm       0.26        0.24             +-----------------+-------------+----------+---------+ Mid upper arm        0.26        0.24             +-----------------+-------------+----------+---------+ Dist upper arm       0.24        0.48             +-----------------+-------------+----------+---------+ Antecubital fossa    0.38        0.41             +-----------------+-------------+----------+---------+ Prox forearm         0.46        0.45   branching +-----------------+-------------+----------+---------+ Mid forearm          0.50        0.33   branching +-----------------+-------------+----------+---------+ Wrist                0.23        0.32             +-----------------+-------------+----------+---------+  +-----------------+-------------+----------+----------------------+ Right Basilic    Diameter (cm)Depth (cm)       Findings        +-----------------+-------------+----------+----------------------+ Prox upper arm       0.70        1.15                          +-----------------+-------------+----------+----------------------+ Mid upper arm        0.74        0.90                          +-----------------+-------------+----------+----------------------+ Dist upper arm       0.57        0.77         branching        +-----------------+-------------+----------+----------------------+ Antecubital fossa    0.66        0.75          thrombus        +-----------------+-------------+----------+----------------------+ Prox forearm         0.64        0.58   branching and thrombus +-----------------+-------------+----------+----------------------+ Mid forearm          0.45  0.40         branching        +-----------------+-------------+----------+----------------------+ Wrist                0.22        0.23                          +-----------------+-------------+----------+----------------------+ Thrombus noted in right basilic at [facility] and proximal forearm. +-----------------+-------------+----------+---------+ Left Cephalic    Diameter (cm)Depth (cm)Findings  +-----------------+-------------+----------+---------+ Shoulder             0.41        0.94             +-----------------+-------------+----------+---------+ Prox upper arm       0.43        0.27             +-----------------+-------------+----------+---------+ Mid upper arm        0.37        0.24             +-----------------+-------------+----------+---------+ Dist upper arm       0.50        0.30             +-----------------+-------------+----------+---------+ Antecubital fossa    0.69        0.23             +-----------------+-------------+----------+---------+ Prox forearm          0.57        0.61   branching +-----------------+-------------+----------+---------+ Mid forearm          0.36        0.25             +-----------------+-------------+----------+---------+ Wrist                0.42        0.36             +-----------------+-------------+----------+---------+ *See table(s) above for measurements and observations.  Diagnosing physician:    Preliminary    No results for input(s): WBC, HGB, HCT, PLT in the last 72 hours. Recent Labs    03/27/19 0526 03/28/19 0657  NA 137 136  K 4.6 4.6  CL 102 98  CO2 24 25  GLUCOSE 170* 214*  BUN 39* 40*  CREATININE 9.05* 8.68*  CALCIUM 8.9 8.7*    Intake/Output Summary (Last 24 hours) at 03/28/2019 1727 Last data filed at 03/28/2019 1555 Gross per 24 hour  Intake 779.11 ml  Output --  Net 779.11 ml     Physical Exam: Vital Signs Blood pressure (!) 154/73, pulse 82, temperature 98.5 F (36.9 C), temperature source Oral, resp. rate 18, height 6' (1.829 m), weight 94 kg, SpO2 98 %. Constitutional: No distress . Vital signs reviewed. HEENT: EOMI, oral membranes moist. Left eye shut.  Neck: supple Cardiovascular: RRR without murmur. No JVD    Respiratory: CTA Bilaterally without wheezes or rales. Normal effort    GI: BS +, non-tender, non-distended   Musculoskeletal:  General: Normal range of motion.  no pain with ROM  Neurological:awake, a little lethargic. Followed basic commands. Normal language. Speech dysarthric. Left CN III palsy with ptosis and decreased medial deviation of left eye. Poor depth perception RUE and RLE 4/5 , 5/5 Left side . No focal sensory deficits.  Skin: Skin iswarm.  Psychiatric: flat    Assessment/Plan: 1. Functional deficits secondary to left paramedian midbrain infarct which require  3+ hours per day of interdisciplinary therapy in a comprehensive inpatient rehab setting.  Physiatrist is providing close team supervision and 24 hour management of active  medical problems listed below.  Physiatrist and rehab team continue to assess barriers to discharge/monitor patient progress toward functional and medical goals  Care Tool:  Bathing  Bathing activity did not occur: Refused Body parts bathed by patient: Right arm, Left arm, Chest, Abdomen, Front perineal area, Right upper leg, Left upper leg, Face   Body parts bathed by helper: Buttocks, Right lower leg, Left lower leg     Bathing assist Assist Level: Moderate Assistance - Patient 50 - 74%     Upper Body Dressing/Undressing Upper body dressing   What is the patient wearing?: Pull over shirt    Upper body assist Assist Level: Minimal Assistance - Patient > 75%    Lower Body Dressing/Undressing Lower body dressing      What is the patient wearing?: Incontinence brief, Pants     Lower body assist Assist for lower body dressing: Maximal Assistance - Patient 25 - 49%     Toileting Toileting Toileting Activity did not occur (Clothing management and hygiene only): N/A (no void or bm)  Toileting assist Assist for toileting: Moderate Assistance - Patient 50 - 74%     Transfers Chair/bed transfer  Transfers assist  Chair/bed transfer activity did not occur: Safety/medical concerns  Chair/bed transfer assist level: Minimal Assistance - Patient > 75%     Locomotion Ambulation   Ambulation assist      Assist level: Moderate Assistance - Patient 50 - 74% Assistive device: Hand held assist Max distance: 50   Walk 10 feet activity   Assist     Assist level: Moderate Assistance - Patient - 50 - 74% Assistive device: No Device, Hand held assist   Walk 50 feet activity   Assist Walk 50 feet with 2 turns activity did not occur: Safety/medical concerns  Assist level: Moderate Assistance - Patient - 50 - 74% Assistive device: No Device, Hand held assist    Walk 150 feet activity   Assist Walk 150 feet activity did not occur: Safety/medical concerns          Walk 10 feet on uneven surface  activity   Assist Walk 10 feet on uneven surfaces activity did not occur: Safety/medical concerns(Per report)         Wheelchair     Assist   Type of Wheelchair: Manual    Wheelchair assist level: Minimal Assistance - Patient > 75% Max wheelchair distance: 1100ft    Wheelchair 50 feet with 2 turns activity    Assist        Assist Level: Minimal Assistance - Patient > 75%   Wheelchair 150 feet activity     Assist      Assist Level: Moderate Assistance - Patient 50 - 74%   Blood pressure (!) 154/73, pulse 82, temperature 98.5 F (36.9 C), temperature source Oral, resp. rate 18, height 6' (1.829 m), weight 94 kg, SpO2 98 %.  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 Antithrombotics: -DVT/anticoagulation:Pharmaceutical:Heparin -antiplatelet therapy:  ASA/PLAVIX X 3 weeks followed by ASA alone. 3. Pain Management:tylenol prn 4. Mood:LCSW to follow for evaluation and support. -antipsychotic agents: N/A 5. Neuropsych: This patientiscapable of making decisions on hisown behalf. 6. Skin/Wound Care:Routine pressure relief measures. 7. Fluids/Electrolytes/Nutrition:Monitor I/O. Has been refusing nectar liquids. Push PO 8. T2DM with neuropathy/nephropathy: Hgb A1c- Was on ozempic, Lantus, Humalog and glucotrol PTA.            CBG (last 3)  Recent Labs    03/28/19 0632 03/28/19 1218 03/28/19 1624  GLUCAP 225* 282* 227*   9. ESRD- on PD at nights.Will convert to HD due to SNF placement, Nephro  determining HD access 10. Hypokalemia/Hypomagnesemia: supps per nephro. - nephro ordered KCL 70meq TID 11. Anemia of chronic disease: On aranesp weekly. Hgb 7.9- stable  12. Chronic diarrhea:Continue creon tid with meals.   -no diarrhea currently 14.HTN: Monitor BP tid--on metoprolol, losartan and low dose amlodipine             1/24 fair control  1/30: labile 15. Chronic dizziness/Orthostatic changes?:  no dizziness reported   -will check orthostatic VS x 2 days 16. Prolonged QTC: QTC 528 on EKG 1/14 17. Lethargy: intermittent- likely CVA releated       LOS: 8 days A FACE TO FACE EVALUATION WAS PERFORMED  Clide Deutscher Adrain Nesbit 03/28/2019, 5:27 PM

## 2019-03-28 NOTE — Progress Notes (Signed)
Physical Therapy Session Note  Patient Details  Name: Kyle [name] MRN: [id number] Date of Birth: Dec 25, 1950  Today's Date: 03/28/2019 PT Individual Time: 1405-1500 PT Individual Time Calculation (min): 55 min   Short Term Goals: Week 2:  PT Short Term Goal 1 (Week 2): Pt will maintain standing balance with min assist up to 5 minutes while engaged in functional task PT Short Term Goal 2 (Week 2): Pt will ambulate 47f with min assist and LRAD PT Short Term Goal 3 (Week 2): [facility] PT Short Term Goal 4 (Week 2): Pt will maintian sitting balance with supervision assist  Skilled Therapeutic Interventions/Progress Updates:   Pt received supine in bed and agreeable to PT. Supine>sit transfer with max assist and max cues for awareness of midline. PT required to block RLE into abduction to prevent automatic adduction with any movement EOB. Sit<>stand x 4 with mod-max assist and RLE blocked into extension and heavy R lateal push. Pt unable to correct in standing and required mod-max assist with return to sitting each time. Mod assist for posterior scooting to prevent sliding off EOB. Pt aprears to have no awareness of near LOB or ability to initiate movement to correct on this day.  Returned to supine with mod assist. Orthostatic BP assessed supine 154/73, HR 82. Sitting 108/58 HR 83. Standing 123/103, HR 178. Returned to sitting, and unable to attain BP. Returned to supine 122/49, HR 81. Throughout session pt noted to have increased dysarthric speech, more tangential, more perseverative, and more confused. RN made aware. Pt left supine in bed with call bell in reach and all needs met.       Therapy Documentation Precautions:  Precautions Precautions: Fall Restrictions Weight Bearing Restrictions: No  Therapy Vitals Temp: 98.5 F (36.9 C) Temp Source: Oral Pulse Rate: 82 Resp: 18 BP: (!) 154/73 Patient Position (if appropriate):  Lying Oxygen Therapy SpO2: 98 % O2 Device: Room Air Pain:   denies   Therapy/Group: Individual Therapy  ALorie Phenix1/30/2021, 3:17 PM

## 2019-03-28 NOTE — Progress Notes (Signed)
Speech Language Pathology Daily Session Note  Patient Details  Name: Kyle [name] MRN: [id number] Date of Birth: April 10, 1950  Today's Date: 03/28/2019 SLP Individual Time: 0820-0851 SLP Individual Time Calculation (min): 31 min  Short Term Goals: Week 2: SLP Short Term Goal 1 (Week 2): Pt will consume current diet with minimal overt s/s of aspiration/dysphagia and Min A cues for use of safe swallow strategies. SLP Short Term Goal 2 (Week 2): Pt will demonstrate use of speech intelligibility strategies (over articulation and slow rate) at sentence level for 90% intelligibility with min A verbal cues. SLP Short Term Goal 3 (Week 2): Pt will demonstrate sustained attention in 15 minute intervals with min A verbal cues for redirection during functional tasks. SLP Short Term Goal 4 (Week 2): Pt will answer yes/no quesitons to demonstrate intellectual awareness of cognition/communication/swallow deficits and physical deficits in 5 out of 10 opportunities with Min A cues. SLP Short Term Goal 5 (Week 2): Pt will complete functional and basic problem solving tasks with min A verbal cues. SLP Short Term Goal 6 (Week 2): Pt will utilize external compensatory memory strategies to recall information in 5 out of 10 opportunities with Min A cues.  Skilled Therapeutic Interventions: Patient received skilled SLP services targeting cognitive goals. Patient responded to yes/no questions in regards to swallowing precautions with 60% accuracy following mod verbal cues. Patient required mod verbal cues to respond to basic verbal problem solving scenarios targeting safety awareness with 75% accuracy. Patient was unable to recall any external compensatory memory strategies despite max verbal cues. At the end of therapy session patient upright in bed, bed alarm activated, and all needs within reach.  Pain Pain Assessment Pain Scale: Faces Faces Pain Scale: No hurt  Therapy/Group: Individual Therapy  Cristy Folks 03/28/2019, 8:54 AM

## 2019-03-29 ENCOUNTER — Inpatient Hospital Stay (HOSPITAL_COMMUNITY): Payer: Medicare PPO | Admitting: Occupational Therapy

## 2019-03-29 LAB — RENAL FUNCTION PANEL
Albumin: 1.9 g/dL — ABNORMAL LOW (ref 3.5–5.0)
Anion gap: 11 (ref 5–15)
BUN: 44 mg/dL — ABNORMAL HIGH (ref 8–23)
CO2: 26 mmol/L (ref 22–32)
Calcium: 8.6 mg/dL — ABNORMAL LOW (ref 8.9–10.3)
Chloride: 101 mmol/L (ref 98–111)
Creatinine, Ser: 8.64 mg/dL — ABNORMAL HIGH (ref 0.61–1.24)
GFR calc Af Amer: 6 mL/min — ABNORMAL LOW (ref >60)
GFR calc non Af Amer: 6 mL/min — ABNORMAL LOW (ref >60)
Glucose, Bld: 139 mg/dL — ABNORMAL HIGH (ref 70–99)
Phosphorus: 5 mg/dL — ABNORMAL HIGH (ref 2.5–4.6)
Potassium: 4.3 mmol/L (ref 3.5–5.1)
Sodium: 138 mmol/L (ref 135–145)

## 2019-03-29 LAB — GLUCOSE, CAPILLARY
Glucose-Capillary: 143 mg/dL — ABNORMAL HIGH (ref 70–99)
Glucose-Capillary: 141 mg/dL — ABNORMAL HIGH (ref 70–99)
Glucose-Capillary: 200 mg/dL — ABNORMAL HIGH (ref 70–99)
Glucose-Capillary: 240 mg/dL — ABNORMAL HIGH (ref 70–99)

## 2019-03-29 MED ORDER — CEFAZOLIN SODIUM-DEXTROSE 2-4 GM/100ML-% IV SOLN
2.0000 g | INTRAVENOUS | Status: AC
  Administered 2019-03-30: 2 g via INTRAVENOUS
  Filled 2019-03-29: qty 100

## 2019-03-29 MED ORDER — HEPARIN 1000 UNIT/ML FOR PERITONEAL DIALYSIS: 500.0000 [IU] | INTRAMUSCULAR | Status: DC | PRN

## 2019-03-29 MED ORDER — DELFLEX-LC/1.5% DEXTROSE 344 MOSM/L IP SOLN: INTRAPERITONEAL | Status: DC

## 2019-03-29 MED ORDER — GENTAMICIN SULFATE 0.1 % EX CREA
1.0000 "application " | TOPICAL_CREAM | Freq: Every day | CUTANEOUS | Status: DC
  Administered 2019-03-30 – 2019-04-01 (×3): 1 via TOPICAL
  Filled 2019-03-29: qty 15

## 2019-03-29 MED ORDER — HEPARIN 1000 UNIT/ML FOR PERITONEAL DIALYSIS
INTRAPERITONEAL | Status: DC | PRN
  Filled 2019-03-29: qty 5000

## 2019-03-29 NOTE — Progress Notes (Signed)
Kyle [name] PHYSICAL MEDICINE & REHABILITATION PROGRESS NOTE   Subjective/Complaints: Denies pain or constipation. Had large BM this morning as per aide.  Sleeping well at night Left eye opening better.   ROS: Patient denies nausea, vomiting, diarrhea, cough, shortness of breath or chest pain, joint or back pain, headache, or mood change.    Objective:   VAS [place] UPPER EXT VEIN MAPPING (PRE-OP AVF)  Result Date: 03/29/2019 UPPER EXTREMITY VEIN MAPPING  Indications: Pre dialysis access. History: ESRD.  Comparison Study: No prior Performing Technologist: Sharion Dove RVS  Examination Guidelines: A complete evaluation includes B-mode imaging, spectral Doppler, color Doppler, and power Doppler as needed of all accessible portions of each vessel. Bilateral testing is considered an integral part of a complete examination. Limited examinations for reoccurring indications may be performed as noted. +-----------------+-------------+----------+---------+ Right Cephalic   Diameter (cm)Depth (cm)Findings  +-----------------+-------------+----------+---------+ Shoulder             0.31        0.17             +-----------------+-------------+----------+---------+ Prox upper arm       0.26        0.24             +-----------------+-------------+----------+---------+ Mid upper arm        0.26        0.24             +-----------------+-------------+----------+---------+ Dist upper arm       0.24        0.48             +-----------------+-------------+----------+---------+ Antecubital fossa    0.38        0.41             +-----------------+-------------+----------+---------+ Prox forearm         0.46        0.45   branching +-----------------+-------------+----------+---------+ Mid forearm          0.50        0.33   branching +-----------------+-------------+----------+---------+ Wrist                0.23        0.32              +-----------------+-------------+----------+---------+ +-----------------+-------------+----------+----------------------+ Right Basilic    Diameter (cm)Depth (cm)       Findings        +-----------------+-------------+----------+----------------------+ Prox upper arm       0.70        1.15                          +-----------------+-------------+----------+----------------------+ Mid upper arm        0.74        0.90                          +-----------------+-------------+----------+----------------------+ Dist upper arm       0.57        0.77         branching        +-----------------+-------------+----------+----------------------+ Antecubital fossa    0.66        0.75          thrombus        +-----------------+-------------+----------+----------------------+ Prox forearm         0.64        0.58   branching and thrombus +-----------------+-------------+----------+----------------------+ Mid forearm  0.45        0.40         branching        +-----------------+-------------+----------+----------------------+ Wrist                0.22        0.23                          +-----------------+-------------+----------+----------------------+ Thrombus noted in right basilic at [facility] and proximal forearm. +-----------------+-------------+----------+---------+ Left Cephalic    Diameter (cm)Depth (cm)Findings  +-----------------+-------------+----------+---------+ Shoulder             0.41        0.94             +-----------------+-------------+----------+---------+ Prox upper arm       0.43        0.27             +-----------------+-------------+----------+---------+ Mid upper arm        0.37        0.24             +-----------------+-------------+----------+---------+ Dist upper arm       0.50        0.30             +-----------------+-------------+----------+---------+ Antecubital fossa    0.69        0.23              +-----------------+-------------+----------+---------+ Prox forearm         0.57        0.61   branching +-----------------+-------------+----------+---------+ Mid forearm          0.36        0.25             +-----------------+-------------+----------+---------+ Wrist                0.42        0.36             +-----------------+-------------+----------+---------+ *See table(s) above for measurements and observations.  Diagnosing physician: Servando Snare MD Electronically signed by Servando Snare MD on 03/29/2019 at 10:54:34 AM.    Final    No results for input(s): WBC, HGB, HCT, PLT in the last 72 hours. Recent Labs    03/28/19 0657 03/29/19 0706  NA 136 138  K 4.6 4.3  CL 98 101  CO2 25 26  GLUCOSE 214* 139*  BUN 40* 44*  CREATININE 8.68* 8.64*  CALCIUM 8.7* 8.6*    Intake/Output Summary (Last 24 hours) at 03/29/2019 1539 Last data filed at 03/29/2019 1344 Gross per 24 hour  Intake 579.11 ml  Output 2 ml  Net 577.11 ml     Physical Exam: Vital Signs Blood pressure 134/65, pulse 75, temperature 98 F (36.7 C), temperature source Oral, resp. rate 18, height 6' (1.829 m), weight 94.6 kg, SpO2 99 %. Constitutional: No distress . Vital signs reviewed. HEENT: EOMI, oral membranes moist. Left eye shut, but he can open it when asked.  Neck: supple Cardiovascular: RRR without murmur. No JVD    Respiratory: CTA Bilaterally without wheezes or rales. Normal effort    GI: BS +, non-tender, non-distended   Musculoskeletal:  General: Normal range of motion.  no pain with ROM  Neurological:awake, a little lethargic. Followed basic commands. Normal language. Speech dysarthric. Left CN III palsy with ptosis and decreased medial deviation of left eye. Poor depth perception RUE and RLE 4/5 , 5/5  Left side . No focal sensory deficits.  Skin: Skin iswarm.  Psychiatric: flat    Assessment/Plan: 1. 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  Physiatrist is providing close team supervision and 24 hour management of active medical problems listed below.  Physiatrist and rehab team continue to assess barriers to discharge/monitor patient progress toward functional and medical goals  Care Tool:  Bathing  Bathing activity did not occur: Refused Body parts bathed by patient: Right arm, Left arm, Chest, Abdomen, Front perineal area, Right upper leg, Left upper leg, Face   Body parts bathed by helper: Buttocks, Right lower leg, Left lower leg     Bathing assist Assist Level: Moderate Assistance - Patient 50 - 74%     Upper Body Dressing/Undressing Upper body dressing   What is the patient wearing?: Pull over shirt    Upper body assist Assist Level: Minimal Assistance - Patient > 75%    Lower Body Dressing/Undressing Lower body dressing      What is the patient wearing?: Incontinence brief, Pants     Lower body assist Assist for lower body dressing: Maximal Assistance - Patient 25 - 49%     Toileting Toileting Toileting Activity did not occur (Clothing management and hygiene only): N/A (no void or bm)  Toileting assist Assist for toileting: Moderate Assistance - Patient 50 - 74%     Transfers Chair/bed transfer  Transfers assist  Chair/bed transfer activity did not occur: Safety/medical concerns  Chair/bed transfer assist level: Minimal Assistance - Patient > 75%     Locomotion Ambulation   Ambulation assist      Assist level: Moderate Assistance - Patient 50 - 74% Assistive device: Hand held assist Max distance: 50   Walk 10 feet activity   Assist     Assist level: Moderate Assistance - Patient - 50 - 74% Assistive device: No Device, Hand held assist   Walk 50 feet activity   Assist Walk 50 feet with 2 turns activity did not occur: Safety/medical concerns  Assist level: Moderate Assistance  - Patient - 50 - 74% Assistive device: No Device, Hand held assist    Walk 150 feet activity   Assist Walk 150 feet activity did not occur: Safety/medical concerns         Walk 10 feet on uneven surface  activity   Assist Walk 10 feet on uneven surfaces activity did not occur: Safety/medical concerns(Per report)         Wheelchair     Assist   Type of Wheelchair: Manual    Wheelchair assist level: Minimal Assistance - Patient > 75% Max wheelchair distance: 158ft    Wheelchair 50 feet with 2 turns activity    Assist        Assist Level: Minimal Assistance - Patient > 75%   Wheelchair 150 feet activity     Assist      Assist Level: Moderate Assistance - Patient 50 - 74%   Blood pressure 134/65, pulse 75, temperature 98 F (36.7 C), temperature source Oral, resp. rate 18, height 6' (1.829 m), weight 94.6 kg, SpO2 99 %.  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 Antithrombotics: -DVT/anticoagulation:Pharmaceutical:Heparin -antiplatelet therapy: ASA/PLAVIX X 3 weeks followed by ASA alone. 3. Pain Management:tylenol prn 4. Mood:LCSW to follow for evaluation and support. -antipsychotic agents: N/A 5. Neuropsych: This patientiscapable of making decisions on hisown behalf. 6. Skin/Wound Care:Routine pressure relief measures. 7. Fluids/Electrolytes/Nutrition:Monitor I/O. Has been refusing nectar liquids. Push PO 8. T2DM with neuropathy/nephropathy: Hgb A1c- Was on ozempic, Lantus, Humalog and glucotrol PTA.            CBG  (last 3)  Recent Labs    03/28/19 2055 03/29/19 0638 03/29/19 1138  GLUCAP 244* 143* 141*  1/31: borderline control, continue to monitor.  9. ESRD- on PD at nights.Will convert to HD due to SNF placement, Nephro determining HD access 10. Hypokalemia/Hypomagnesemia: supps per nephro. - nephro ordered KCL 60meq TID 11. Anemia of chronic disease: On aranesp weekly. Hgb 7.9- stable  12. Chronic diarrhea:Continue creon tid with meals.   -no diarrhea currently 14.HTN: Monitor BP tid--on metoprolol, losartan and low dose amlodipine             1/24 fair control  1/30: labile  1/31: well controlled.  15. Chronic dizziness/Orthostatic changes?:  no dizziness reported   -will check orthostatic VS x 2 days 16. Prolonged QTC: QTC 528 on EKG 1/14 17. Lethargy: intermittent- likely CVA releated       LOS: 9 days A FACE TO FACE EVALUATION WAS PERFORMED  Kyle [name] 03/29/2019, 3:39 PM

## 2019-03-29 NOTE — Progress Notes (Signed)
Patient set off bed alarm trying to get out of bed, state he needed the bathroom. Attempted to assist patient to [facility] on stedy +2. Stedy did not feel safe at this time, patient had heavy lean to his right, unable to sit upright without considerable support. Returned patient to bed, cleaned up incontinent bowel. Will communicate to therapy for further evaluation tomorrow.

## 2019-03-29 NOTE — Progress Notes (Addendum)
Occupational Therapy Session Note  Patient Details  Name: Kyle [name] MRN: [id number] Date of Birth: Jan 28, 1951  Today's Date: 03/29/2019 OT Individual Time: 1445-1535 OT Individual Time Calculation (min): 50 min    Skilled Therapeutic Interventions/Progress Updates:    Pt greeted in bed with no c/o pain. Pt unaware that he was hooked up to peritoneal dialysis and it took a good deal of convincing for pt to believe that he was still hooked up. Pt also wondered why his phone wouldn't turn on when it was charged, as he wanted to call his wife. OT turned on phone for him, planning to call spouse together during session. Pt wanted to urinate first. Bed mobility initiated for [facility] transfer but pt had BM smeared all over chuck pad and his brief was halfway off. Pt unaware of incontinence. Min-Mod A for rolling Rt>Lt  respectively using bedrails . He assisted OT with pericare however needed Mod A due to volume of BM and pt stating he was unable to see residue on the wash cloth. Total A for donning brief. Max A for washing hands with hand sanitizer with step by step cues for sequencing. When cued to press down on dispenser, pt began pulling it out of bottle attachment. When cued to put the cap back on, pt squirted sanitizer on his gown repeatedly. Mod A for supine<sit. While EOB worked on sitting balance and functional cognition while we called wife Kyle [name]. Pt initially required Mod A for dynamic balance due to Rt lean, fading to close supervision after a few minutes of manual feedback. Vcs for not crossing legs during call due to posterior LOBs. He needed step by step cues and Min A to call spouse. Affect visibly brightened after talking with spouse. Afterwards pt returned to bed and boosted himself up using B UEs and headboard. He remained in bed with all needs within reach and bed alarm set.   Therapy Documentation Precautions:  Precautions Precautions: Fall Restrictions Weight Bearing Restrictions:  No Vital Signs: Therapy Vitals Temp: 98 F (36.7 C) Temp Source: Oral Pulse Rate: 75 Resp: 18 BP: 134/65 Patient Position (if appropriate): Lying Oxygen Therapy SpO2: 99 % ADL: ADL Eating: Not assessed Grooming: Maximal assistance Where Assessed-Grooming: Edge of bed Upper Body Bathing: Minimal assistance Where Assessed-Upper Body Bathing: Edge of bed Lower Body Bathing: Maximal assistance Where Assessed-Lower Body Bathing: Edge of bed Upper Body Dressing: Moderate assistance Where Assessed-Upper Body Dressing: Edge of bed Lower Body Dressing: Maximal assistance Where Assessed-Lower Body Dressing: Edge of bed Toileting: Not assessed Toilet Transfer: Not assessed Tub/Shower Transfer: Not assessed      Therapy/Group: Individual Therapy  Huston Stonehocker A Avanthika Dehnert 03/29/2019, 3:56 PM

## 2019-03-29 NOTE — Progress Notes (Signed)
Hemodialysis was called to inform nurse that patient's peritoneal dialysis session was finished. There was no answer. Report given to oncoming nurse.

## 2019-03-29 NOTE — Progress Notes (Signed)
[facility] ROUNDING NOTE   Subjective:   This is a 69 year old gentleman with a history of diabetes mellitus type 2 end-stage renal disease on peritoneal dialysis seizure disorder COPD morbid obesity gait disorder with falls chronic diarrhea admitted to [facility] on 03/12/2019 with a history of significant hypercalcemia confusion and weakness.  And having missed dialysis for 3 days.  MRI of the brain for left-sided ptosis revealed an acute left paramedian pons and old infarcts in the left paramedian pons chronic  small vessel disease.  Ejection fraction 65 to 61% grade 1 diastolic dysfunction carotid Dopplers negative for ICA stenosis.  He continues on peritoneal dialysis.  He is using all 1.5% dextrose with successful ultrafiltration.  Patient for placement in skilled nursing facility will need to transition to intermittent hemodialysis will arrange placement of vascular access.  Appreciate assistance from vein and vascular surgery and Dr Siri Cole.  Blood pressure 137/60 pulse 69 temperature 97.5 O2 sats 99%  Sodium 138 potassium 4.3 chloride 101 CO2 26 BUN 44 creatinine 8.64 glucose 139 calcium 8.6 phosphorus 5.0 albumin 1.9  Amlodipine 5 mg daily, aspirin 81 mg daily Lipitor 80 mg daily   PhosLo 1.3 g 3 times daily, Plavix 75 mg daily gabapentin 100 mg twice daily, Lantus 20 units daily Keppra 500 mg twice daily Synthroid 200 mcg daily Creon 24,000 units 3 times daily, metoprolol 50 mg daily multivitamins 1 daily, provigil 100mg  daily.  Darbepoetin ordered 25 mcg q. Sunday, amlodipine 5 mg daily  Objective:  Vital signs in last 24 hours:  Temp:  [97.5 F (36.4 C)-98.5 F (36.9 C)] 97.5 F (36.4 C) (01/31 0549) Pulse Rate:  [69-178] 69 (01/31 0807) Resp:  [18-19] 19 (01/31 0549) BP: (108-154)/(58-103) 137/63 (01/31 0807) SpO2:  [97 %-99 %] 99 % (01/31 0549) Weight:  [94.6 kg-94.7 kg] 94.6 kg (01/31 0500)  Weight change: -0.5 kg Filed Weights    03/28/19 0500 03/28/19 1739 03/29/19 0500  Weight: 94 kg 94.7 kg 94.6 kg    Intake/Output: I/O last 3 completed shifts: In: 539.1 [P.O.:200; IV Piggyback:339.1] Out: -    Intake/Output this shift:  No intake/output data recorded.  YWV:PXTGGYIRSWN ill, NAD IOE:VOJJ eye ptosis EYES:EOMI KK:XFGHWEX, normal S1 and S2 PULM:Clear bilaterally, normal work of breathing, HBZ:JIRC, nontender. Bowel sounds present. PD catheter in left upper quadrant, bandaged, deferred on evaluation of exit site SKIN:No rashes or lesions EXT:No significant edema   Basic Metabolic Panel: Recent Labs  Lab 03/25/19 0945 03/25/19 0945 03/26/19 0530 03/26/19 0530 03/27/19 0526 03/28/19 0657 03/29/19 0706  NA 139  --  141  --  137 136 138  K 4.8  --  5.0  --  4.6 4.6 4.3  CL 103  --  100  --  102 98 101  CO2 24  --  26  --  24 25 26   GLUCOSE 194*  --  168*  --  170* 214* 139*  BUN 34*  --  35*  --  39* 40* 44*  CREATININE 8.90*  --  8.83*  --  9.05* 8.68* 8.64*  CALCIUM 8.5*   < > 9.1   < > 8.9 8.7* 8.6*  PHOS 4.1  --  3.8  --  4.7* 4.9* 5.0*   < > = values in this interval not displayed.    Liver Function Tests: Recent Labs  Lab 03/23/19 7893 03/23/19 8101 03/25/19 0945 03/26/19 0530 03/27/19 0526 03/28/19 0657 03/29/19 0706  AST 14*  --   --   --   --   --   --  ALT 13  --   --   --   --   --   --   ALKPHOS 72  --   --   --   --   --   --   BILITOT <0.1*  --   --   --   --   --   --   PROT 5.2*  --   --   --   --   --   --   ALBUMIN 1.9*   < > 2.1* 2.0* 1.9* 1.9* 1.9*   < > = values in this interval not displayed.   No results for input(s): LIPASE, AMYLASE in the last 168 hours. No results for input(s): AMMONIA in the last 168 hours.  CBC: Recent Labs  Lab 03/23/19 0707  WBC 8.0  HGB 7.9*  HCT 24.7*  MCV 102.5*  PLT 228    Cardiac Enzymes: No results for input(s): CKTOTAL, CKMB, CKMBINDEX, TROPONINI in the last 168 hours.  BNP: Invalid input(s):  POCBNP  CBG: Recent Labs  Lab 03/28/19 0632 03/28/19 1218 03/28/19 1624 03/28/19 2055 03/29/19 0638  GLUCAP 225* 282* 227* 244* 143*    Microbiology: Results for orders placed or performed during the hospital encounter of 03/20/19  Culture, Urine     Status: None   Collection Time: 03/22/19  4:15 PM   Specimen: Urine, Catheterized  Result Value Ref Range Status   Specimen Description URINE, CATHETERIZED  Final   Special Requests SITE NOT SPECIFIED  Final   Culture   Final    NO GROWTH Performed at [facility], [place] [address], [place], [place] [address]    Report Status 03/23/2019 FINAL  Final    Coagulation Studies: No results for input(s): LABPROT, INR in the last 72 hours.  Urinalysis: No results for input(s): COLORURINE, LABSPEC, PHURINE, GLUCOSEU, HGBUR, BILIRUBINUR, KETONESUR, PROTEINUR, UROBILINOGEN, NITRITE, LEUKOCYTESUR in the last 72 hours.  Invalid input(s): APPERANCEUR    Imaging: VAS [place] UPPER EXT VEIN MAPPING (PRE-OP AVF)  Result Date: 03/28/2019 UPPER EXTREMITY VEIN MAPPING  Indications: Pre dialysis access. History: ESRD.  Comparison Study: No prior Performing Technologist: Sharion Dove RVS  Examination Guidelines: A complete evaluation includes B-mode imaging, spectral Doppler, color Doppler, and power Doppler as needed of all accessible portions of each vessel. Bilateral testing is considered an integral part of a complete examination. Limited examinations for reoccurring indications may be performed as noted. +-----------------+-------------+----------+---------+ Right Cephalic   Diameter (cm)Depth (cm)Findings  +-----------------+-------------+----------+---------+ Shoulder             0.31        0.17             +-----------------+-------------+----------+---------+ Prox upper arm       0.26        0.24             +-----------------+-------------+----------+---------+ Mid upper arm        0.26        0.24              +-----------------+-------------+----------+---------+ Dist upper arm       0.24        0.48             +-----------------+-------------+----------+---------+ Antecubital fossa    0.38        0.41             +-----------------+-------------+----------+---------+ Prox forearm         0.46  0.45   branching +-----------------+-------------+----------+---------+ Mid forearm          0.50        0.33   branching +-----------------+-------------+----------+---------+ Wrist                0.23        0.32             +-----------------+-------------+----------+---------+ +-----------------+-------------+----------+----------------------+ Right Basilic    Diameter (cm)Depth (cm)       Findings        +-----------------+-------------+----------+----------------------+ Prox upper arm       0.70        1.15                          +-----------------+-------------+----------+----------------------+ Mid upper arm        0.74        0.90                          +-----------------+-------------+----------+----------------------+ Dist upper arm       0.57        0.77         branching        +-----------------+-------------+----------+----------------------+ Antecubital fossa    0.66        0.75          thrombus        +-----------------+-------------+----------+----------------------+ Prox forearm         0.64        0.58   branching and thrombus +-----------------+-------------+----------+----------------------+ Mid forearm          0.45        0.40         branching        +-----------------+-------------+----------+----------------------+ Wrist                0.22        0.23                          +-----------------+-------------+----------+----------------------+ Thrombus noted in right basilic at [facility] and proximal forearm. +-----------------+-------------+----------+---------+ Left Cephalic    Diameter (cm)Depth (cm)Findings   +-----------------+-------------+----------+---------+ Shoulder             0.41        0.94             +-----------------+-------------+----------+---------+ Prox upper arm       0.43        0.27             +-----------------+-------------+----------+---------+ Mid upper arm        0.37        0.24             +-----------------+-------------+----------+---------+ Dist upper arm       0.50        0.30             +-----------------+-------------+----------+---------+ Antecubital fossa    0.69        0.23             +-----------------+-------------+----------+---------+ Prox forearm         0.57        0.61   branching +-----------------+-------------+----------+---------+ Mid forearm          0.36        0.25             +-----------------+-------------+----------+---------+ Wrist  0.42        0.36             +-----------------+-------------+----------+---------+ *See table(s) above for measurements and observations.  Diagnosing physician:    Preliminary      Medications:   . dialysis solution 1.5% low-MG/low-CA    . dialysis solution 1.5% low-MG/low-CA    . ferric gluconate (FERRLECIT/NULECIT) IV 125 mg (03/28/19 1555)   . amLODipine  5 mg Oral Daily  . aspirin EC  81 mg Oral Daily  . atorvastatin  80 mg Oral Daily  . calcium acetate  1,334 mg Oral TID WC  . clopidogrel  75 mg Oral Daily  . darbepoetin (ARANESP) injection - NON-DIALYSIS  25 mcg Subcutaneous Q Sun-1800  . feeding supplement (NEPRO CARB STEADY)  237 mL Oral BID BM  . gabapentin  100 mg Oral BID  . gentamicin cream  1 application Topical Daily  . heparin  5,000 Units Subcutaneous Q8H  . insulin aspart  0-15 Units Subcutaneous TID WC  . insulin glargine  30 Units Subcutaneous Daily  . levETIRAcetam  500 mg Oral BID  . levothyroxine  200 mcg Oral q morning - 10a  . lipase/protease/amylase  24,000 Units Oral TID WC  . metoprolol succinate  50 mg Oral Daily  . modafinil   100 mg Oral Daily  . multivitamin  1 tablet Oral Daily   acetaminophen, bisacodyl, guaiFENesin-dextromethorphan, dianeal solution for CAPD/CCPD with heparin, polyethylene glycol, prochlorperazine **OR** prochlorperazine **OR** prochlorperazine, Resource ThickenUp Clear  Assessment/ Plan:   ESRD-peritoneal dialysis diabetic nephropathy uses CCPD 2.5 L with 5 exchanges no pauses.  Continues on 1.5% bags with good ultrafiltration.  Patient now for placement at SNF where peritoneal dialysis will not be be able to be performed.  We will therefore transition to hemodialysis week of 03/30/2019  Anemia iron saturation 20% have started IV Ferrlecit and administering darbepoetin 25 mcg weekly to be administered 03/29/2019  History of hypokalemia.  Potassium supplementation.  We will continue to follow  Bone mineral continue binders vitamin D.  Continues on PhosLo 2 with meals. vitamin D discontinued due to hypercalcemia at this point.  PTH 91.  Chronic pancreatitis continues on pancreatic enzyme supplementation  Status post pontine stroke continues on Plavix and aspirin  Hypertension/volume continues to ultrafilter well.  Losartan discontinued 03/23/2019.   amlodipine to 5 mg daily  Diabetes mellitus as per primary team  Hypothyroidism on replacement therapy  Vascular access appreciate assistance from Dr. Siri Cole for placement of tunneled dialysis catheter and fistula week of 03/30/2019.  Transition to SNF and hemodialysis week of 03/30/2019     LOS: [place] @TODAY @8 :52 AM

## 2019-03-29 NOTE — Progress Notes (Signed)
  Progress Note    03/29/2019 10:30 AM * No surgery found *  Subjective: No overnight issues  Vitals:   03/29/19 0549 03/29/19 0807  BP: (!) 152/69 137/63  Pulse: 70 69  Resp: 19   Temp: (!) 97.5 F (36.4 C)   SpO2: 99%     Physical Exam: Awake alert oriented On the respirations Palpable bilateral radial pulses No IVs left arm  CBC    Component Value Date/Time   WBC 8.0 03/23/2019 0707   RBC 2.41 (L) 03/23/2019 0707   HGB 7.9 (L) 03/23/2019 0707   HGB 14.2 08/08/2013 1947   HCT 24.7 (L) 03/23/2019 0707   HCT 42.1 08/08/2013 1947   PLT 228 03/23/2019 0707   PLT 144 (L) 08/08/2013 1947   MCV 102.5 (H) 03/23/2019 0707   MCV 92 08/08/2013 1947   MCH 32.8 03/23/2019 0707   MCHC 32.0 03/23/2019 0707   RDW 15.8 (H) 03/23/2019 0707   RDW 12.9 08/08/2013 1947   LYMPHSABS 1.3 03/12/2019 1947   MONOABS 0.4 03/12/2019 1947   EOSABS 0.1 03/12/2019 1947   BASOSABS 0.1 03/12/2019 1947    BMET    Component Value Date/Time   NA 138 03/29/2019 0706   NA 138 08/08/2013 1947   K 4.3 03/29/2019 0706   K 3.9 08/08/2013 1947   CL 101 03/29/2019 0706   CL 107 08/08/2013 1947   CO2 26 03/29/2019 0706   CO2 21 08/08/2013 1947   GLUCOSE 139 (H) 03/29/2019 0706   GLUCOSE 100 (H) 08/08/2013 1947   BUN 44 (H) 03/29/2019 0706   BUN 32 (H) 08/08/2013 1947   CREATININE 8.64 (H) 03/29/2019 0706   CREATININE 2.04 (H) 08/08/2013 1947   CALCIUM 8.6 (L) 03/29/2019 0706   CALCIUM 9.1 08/08/2013 1947   GFRNONAA 6 (L) 03/29/2019 0706   GFRNONAA 33 (L) 08/08/2013 1947   GFRAA 6 (L) 03/29/2019 0706   GFRAA 38 (L) 08/08/2013 1947    INR    Component Value Date/Time   INR 0.9 09/17/2018 0456     Intake/Output Summary (Last 24 hours) at 03/29/2019 1030 Last data filed at 03/29/2019 0924 Gross per 24 hour  Intake 339.11 ml  Output 2 ml  Net 337.11 ml     Assessment/plan:  69 y.o. male is in need of conversion to hemodialysis from peritoneal dialysis.  Plan will be for tunnel  dialysis catheter and left arm fistula versus graft tomorrow in the OR.  Appears to have suitable cephalic vein on the left.  I again answered his questions regarding fistula or graft he appears to have limited understanding at this point will likely need to reiterate tomorrow.    Sorren Vallier C. Donzetta Matters, MD [facility] Office: [phone] Pager: [phone]  03/29/2019 10:30 AM

## 2019-03-29 NOTE — Progress Notes (Signed)
Unable to reach dialysis to disconnect patient from PD. Will continue to reach out.

## 2019-03-29 NOTE — Progress Notes (Signed)
Occupational Therapy Weekly Progress Note  Patient Details  Name: Kyle [name] MRN: [id number] Date of Birth: 08-Dec-1950  Beginning of progress report period: 03/21/2019 End of progress report period: 03/30/2019  Today's Date: 03/30/2019 OT Individual Time:  - 60 minutes missed due to MD hold     Patient has met 2 of 3 short term goals.    Pt is making slow progress towards LTGs at this time. He presently requires Mod A for stand pivot toilet or BSC transfers using RW or grab bar. Mod-Max A for dynamic standing balance during self care tasks due to Rt combined with posterior lean. Pt continues to exhibit decreased awareness of deficits and overall safety awareness. Continue OT POC.   Patient continues to demonstrate the following deficits: muscle weakness, decreased cardiorespiratoy endurance, impaired timing and sequencing, unbalanced muscle activation, ataxia, decreased coordination and decreased motor planning, Lt ptosis, decreased midline orientation and decreased attention to right, decreased attention, decreased awareness, decreased problem solving, decreased safety awareness and decreased memory and decreased sitting balance, decreased standing balance, decreased postural control, hemiplegia and decreased balance strategies and therefore will continue to benefit from skilled OT intervention to enhance overall performance with BADL.  Patient progressing toward long term goals..  Continue plan of care.  OT Short Term Goals Week 1:  OT Short Term Goal 1 (Week 1): Pt will complete toilet transfer with 1 assist and LRAD OT Short Term Goal 1 - Progress (Week 1): Met OT Short Term Goal 2 (Week 1): Pt will don overhead shirt with Min A OT Short Term Goal 2 - Progress (Week 1): Met OT Short Term Goal 3 (Week 1): Pt will complete LB self care with Mod A for dynamic standing balance OT Short Term Goal 3 - Progress (Week 1): Progressing toward goal Week 2:  OT Short Term Goal 1 (Week 2):  STGs=LTGs due to ELOS  Skilled Therapeutic Interventions/Progress Updates:    Pt off unit for medical procedure during scheduled OT intervention. When OT arrived later in the morning sign was posted on the door for therapy hold today. 60 minutes missed.   Therapy Documentation Precautions:  Precautions Precautions: Fall Restrictions Weight Bearing Restrictions: No Vital Signs: Therapy Vitals Temp: 98.6 F (37 C) Pulse Rate: 81 Resp: 19 BP: 136/83 Patient Position (if appropriate): Lying Oxygen Therapy SpO2: 96 % O2 Device: Room Air ADL: ADL Eating: Not assessed Grooming: Maximal assistance Where Assessed-Grooming: Edge of bed Upper Body Bathing: Minimal assistance Where Assessed-Upper Body Bathing: Edge of bed Lower Body Bathing: Maximal assistance Where Assessed-Lower Body Bathing: Edge of bed Upper Body Dressing: Moderate assistance Where Assessed-Upper Body Dressing: Edge of bed Lower Body Dressing: Maximal assistance Where Assessed-Lower Body Dressing: Edge of bed Toileting: Not assessed Toilet Transfer: Not assessed Tub/Shower Transfer: Not assessed      Therapy/Group: Individual Therapy  [name] [name] 03/30/2019, 4:17 PM

## 2019-03-30 ENCOUNTER — Inpatient Hospital Stay (HOSPITAL_COMMUNITY): Payer: Medicare PPO

## 2019-03-30 ENCOUNTER — Inpatient Hospital Stay (HOSPITAL_COMMUNITY): Payer: Medicare PPO | Admitting: Physical Therapy

## 2019-03-30 ENCOUNTER — Encounter (HOSPITAL_COMMUNITY): Payer: Self-pay | Admitting: Physical Medicine & Rehabilitation

## 2019-03-30 ENCOUNTER — Encounter (HOSPITAL_COMMUNITY)
Admission: RE | Disposition: A | Discharge status: Other Acute Inpatient Hospital | Payer: Self-pay | Source: Other Acute Inpatient Hospital | Attending: Physical Medicine & Rehabilitation

## 2019-03-30 ENCOUNTER — Inpatient Hospital Stay (HOSPITAL_COMMUNITY): Payer: Medicare PPO | Admitting: Certified Registered Nurse Anesthetist

## 2019-03-30 ENCOUNTER — Inpatient Hospital Stay (HOSPITAL_COMMUNITY): Payer: Medicare PPO | Admitting: Speech Pathology

## 2019-03-30 ENCOUNTER — Inpatient Hospital Stay (HOSPITAL_COMMUNITY): Payer: Medicare PPO | Admitting: Occupational Therapy

## 2019-03-30 HISTORY — PX: INSERTION OF DIALYSIS CATHETER: SHX1324

## 2019-03-30 HISTORY — PX: AV FISTULA PLACEMENT: SHX1204

## 2019-03-30 LAB — GLUCOSE, CAPILLARY
Glucose-Capillary: 133 mg/dL — ABNORMAL HIGH (ref 70–99)
Glucose-Capillary: 109 mg/dL — ABNORMAL HIGH (ref 70–99)
Glucose-Capillary: 170 mg/dL — ABNORMAL HIGH (ref 70–99)
Glucose-Capillary: 217 mg/dL — ABNORMAL HIGH (ref 70–99)
Glucose-Capillary: 331 mg/dL — ABNORMAL HIGH (ref 70–99)

## 2019-03-30 LAB — CBC
HCT: 19.1 % — ABNORMAL LOW (ref 39.0–52.0)
HCT: 23.2 % — ABNORMAL LOW (ref 39.0–52.0)
HCT: 23.1 % — ABNORMAL LOW (ref 39.0–52.0)
Hemoglobin: 6.2 g/dL — CL (ref 13.0–17.0)
Hemoglobin: 7.5 g/dL — ABNORMAL LOW (ref 13.0–17.0)
Hemoglobin: 7.6 g/dL — ABNORMAL LOW (ref 13.0–17.0)
MCH: 33.5 pg (ref 26.0–34.0)
MCH: 33.3 pg (ref 26.0–34.0)
MCH: 33.3 pg (ref 26.0–34.0)
MCHC: 32.5 g/dL (ref 30.0–36.0)
MCHC: 32.3 g/dL (ref 30.0–36.0)
MCHC: 32.9 g/dL (ref 30.0–36.0)
MCV: 103.2 fL — ABNORMAL HIGH (ref 80.0–100.0)
MCV: 103.1 fL — ABNORMAL HIGH (ref 80.0–100.0)
MCV: 101.3 fL — ABNORMAL HIGH (ref 80.0–100.0)
Platelets: 231 10*3/uL (ref 150–400)
Platelets: 229 10*3/uL (ref 150–400)
Platelets: 236 10*3/uL (ref 150–400)
RBC: 1.85 MIL/uL — ABNORMAL LOW (ref 4.22–5.81)
RBC: 2.25 MIL/uL — ABNORMAL LOW (ref 4.22–5.81)
RBC: 2.28 MIL/uL — ABNORMAL LOW (ref 4.22–5.81)
RDW: 16 % — ABNORMAL HIGH (ref 11.5–15.5)
RDW: 15.9 % — ABNORMAL HIGH (ref 11.5–15.5)
RDW: 15.8 % — ABNORMAL HIGH (ref 11.5–15.5)
WBC: 9 10*3/uL (ref 4.0–10.5)
WBC: 12.8 10*3/uL — ABNORMAL HIGH (ref 4.0–10.5)
WBC: 12.5 10*3/uL — ABNORMAL HIGH (ref 4.0–10.5)
nRBC: 0 % (ref 0.0–0.2)
nRBC: 0 % (ref 0.0–0.2)
nRBC: 0 % (ref 0.0–0.2)

## 2019-03-30 LAB — RENAL FUNCTION PANEL
Albumin: 1.8 g/dL — ABNORMAL LOW (ref 3.5–5.0)
Anion gap: 13 (ref 5–15)
BUN: 44 mg/dL — ABNORMAL HIGH (ref 8–23)
CO2: 24 mmol/L (ref 22–32)
Calcium: 8.4 mg/dL — ABNORMAL LOW (ref 8.9–10.3)
Chloride: 99 mmol/L (ref 98–111)
Creatinine, Ser: 8.37 mg/dL — ABNORMAL HIGH (ref 0.61–1.24)
GFR calc Af Amer: 7 mL/min — ABNORMAL LOW (ref >60)
GFR calc non Af Amer: 6 mL/min — ABNORMAL LOW (ref >60)
Glucose, Bld: 137 mg/dL — ABNORMAL HIGH (ref 70–99)
Phosphorus: 4.9 mg/dL — ABNORMAL HIGH (ref 2.5–4.6)
Potassium: 4 mmol/L (ref 3.5–5.1)
Sodium: 136 mmol/L (ref 135–145)

## 2019-03-30 LAB — POCT I-STAT, CHEM 8
BUN: 41 mg/dL — ABNORMAL HIGH (ref 8–23)
Calcium, Ion: 1.13 mmol/L — ABNORMAL LOW (ref 1.15–1.40)
Chloride: 98 mmol/L (ref 98–111)
Creatinine, Ser: 9.2 mg/dL — ABNORMAL HIGH (ref 0.61–1.24)
Glucose, Bld: 121 mg/dL — ABNORMAL HIGH (ref 70–99)
HCT: 17 % — ABNORMAL LOW (ref 39.0–52.0)
Hemoglobin: 5.8 g/dL — CL (ref 13.0–17.0)
Potassium: 4.2 mmol/L (ref 3.5–5.1)
Sodium: 135 mmol/L (ref 135–145)
TCO2: 26 mmol/L (ref 22–32)

## 2019-03-30 LAB — PREPARE RBC (CROSSMATCH)

## 2019-03-30 LAB — ABO/RH: ABO/RH(D): O POS

## 2019-03-30 SURGERY — ARTERIOVENOUS (AV) FISTULA CREATION
Anesthesia: General | Site: Neck | Laterality: Right

## 2019-03-30 MED ORDER — CHLORHEXIDINE GLUCONATE CLOTH 2 % EX PADS
6.0000 | MEDICATED_PAD | Freq: Every day | CUTANEOUS | Status: DC
  Administered 2019-03-31 – 2019-04-03 (×3): 6 via TOPICAL

## 2019-03-30 MED ORDER — ONDANSETRON HCL 4 MG/2ML IJ SOLN
INTRAMUSCULAR | Status: DC | PRN
  Administered 2019-03-30: 4 mg via INTRAVENOUS

## 2019-03-30 MED ORDER — SODIUM CHLORIDE 0.9 % IV SOLN: INTRAVENOUS | Status: DC | PRN

## 2019-03-30 MED ORDER — 0.9 % SODIUM CHLORIDE (POUR BTL) OPTIME
TOPICAL | Status: DC | PRN
  Administered 2019-03-30: 1000 mL

## 2019-03-30 MED ORDER — LIDOCAINE-EPINEPHRINE 1 %-1:100000 IJ SOLN
INTRAMUSCULAR | Status: AC
  Filled 2019-03-30: qty 1

## 2019-03-30 MED ORDER — DEXAMETHASONE SODIUM PHOSPHATE 10 MG/ML IJ SOLN
INTRAMUSCULAR | Status: DC | PRN
  Administered 2019-03-30: 10 mg via INTRAVENOUS

## 2019-03-30 MED ORDER — LIDOCAINE HCL (CARDIAC) PF 100 MG/5ML IV SOSY
PREFILLED_SYRINGE | INTRAVENOUS | Status: DC | PRN
  Administered 2019-03-30: 20 mg via INTRAVENOUS

## 2019-03-30 MED ORDER — FENTANYL CITRATE (PF) 250 MCG/5ML IJ SOLN
INTRAMUSCULAR | Status: AC
  Filled 2019-03-30: qty 5

## 2019-03-30 MED ORDER — HEPARIN SODIUM (PORCINE) 1000 UNIT/ML IJ SOLN
INTRAMUSCULAR | Status: AC
  Filled 2019-03-30: qty 1

## 2019-03-30 MED ORDER — HEPARIN SODIUM (PORCINE) 1000 UNIT/ML IJ SOLN
INTRAMUSCULAR | Status: DC | PRN
  Administered 2019-03-30: 3400 [IU]

## 2019-03-30 MED ORDER — HEPARIN SODIUM (PORCINE) 1000 UNIT/ML IJ SOLN
INTRAMUSCULAR | Status: DC | PRN
  Administered 2019-03-30: 3000 [IU] via INTRAVENOUS

## 2019-03-30 MED ORDER — PHENYLEPHRINE HCL-NACL 10-0.9 MG/250ML-% IV SOLN
INTRAVENOUS | Status: DC | PRN
  Administered 2019-03-30: 30 ug/min via INTRAVENOUS

## 2019-03-30 MED ORDER — GENTAMICIN SULFATE 0.1 % EX CREA
1.0000 "application " | TOPICAL_CREAM | Freq: Every day | CUTANEOUS | Status: DC
  Administered 2019-03-30 – 2019-04-01 (×3): 1 via TOPICAL
  Filled 2019-03-30: qty 15

## 2019-03-30 MED ORDER — PROPOFOL 10 MG/ML IV BOLUS
INTRAVENOUS | Status: DC | PRN
  Administered 2019-03-30: 120 mg via INTRAVENOUS

## 2019-03-30 MED ORDER — SODIUM CHLORIDE 0.9 % IV SOLN
INTRAVENOUS | Status: AC
  Filled 2019-03-30: qty 1.2

## 2019-03-30 MED ORDER — LIDOCAINE HCL (PF) 1 % IJ SOLN
INTRAMUSCULAR | Status: AC
  Filled 2019-03-30: qty 30

## 2019-03-30 MED ORDER — FENTANYL CITRATE (PF) 250 MCG/5ML IJ SOLN
INTRAMUSCULAR | Status: DC | PRN
  Administered 2019-03-30: 25 ug via INTRAVENOUS
  Administered 2019-03-30 (×2): 50 ug via INTRAVENOUS

## 2019-03-30 MED ORDER — SODIUM CHLORIDE 0.9 % IV SOLN
INTRAVENOUS | Status: DC | PRN
  Administered 2019-03-30: 500 mL

## 2019-03-30 MED ORDER — PHENYLEPHRINE 40 MCG/ML (10ML) SYRINGE FOR IV PUSH (FOR BLOOD PRESSURE SUPPORT)
PREFILLED_SYRINGE | INTRAVENOUS | Status: DC | PRN
  Administered 2019-03-30: 80 ug via INTRAVENOUS

## 2019-03-30 MED ORDER — FENTANYL CITRATE (PF) 100 MCG/2ML IJ SOLN: 25.0000 ug | INTRAMUSCULAR | Status: DC | PRN

## 2019-03-30 MED ORDER — CHLORHEXIDINE GLUCONATE CLOTH 2 % EX PADS
6.0000 | MEDICATED_PAD | Freq: Every day | CUTANEOUS | Status: DC
  Administered 2019-03-30 – 2019-04-01 (×3): 6 via TOPICAL

## 2019-03-30 MED ORDER — PROPOFOL 10 MG/ML IV BOLUS
INTRAVENOUS | Status: AC
  Filled 2019-03-30: qty 20

## 2019-03-30 SURGICAL SUPPLY — 58 items
ARMBAND PINK RESTRICT EXTREMIT (MISCELLANEOUS) ×3 IMPLANT
BAG DECANTER FOR FLEXI CONT (MISCELLANEOUS) IMPLANT
BIOPATCH RED 1 DISK 7.0 (GAUZE/BANDAGES/DRESSINGS) ×3 IMPLANT
CANISTER SUCT 3000ML PPV (MISCELLANEOUS) ×3 IMPLANT
CATH PALINDROME RT-P 15FX19CM (CATHETERS) IMPLANT
CATH PALINDROME RT-P 15FX23CM (CATHETERS) ×3 IMPLANT
CATH PALINDROME RT-P 15FX28CM (CATHETERS) IMPLANT
CATH PALINDROME RT-P 15FX55CM (CATHETERS) IMPLANT
CATH STRAIGHT 5FR 65CM (CATHETERS) IMPLANT
CLIP VESOCCLUDE MED 6/CT (CLIP) ×3 IMPLANT
CLIP VESOCCLUDE SM WIDE 6/CT (CLIP) ×3 IMPLANT
COVER PROBE W GEL 5X96 (DRAPES) ×3 IMPLANT
COVER SURGICAL LIGHT HANDLE (MISCELLANEOUS) IMPLANT
COVER WAND RF STERILE (DRAPES) IMPLANT
DECANTER SPIKE VIAL GLASS SM (MISCELLANEOUS) IMPLANT
DERMABOND ADVANCED (GAUZE/BANDAGES/DRESSINGS) ×2
DERMABOND ADVANCED .7 DNX12 (GAUZE/BANDAGES/DRESSINGS) ×4 IMPLANT
DRAPE C-ARM 42X72 X-RAY (DRAPES) ×3 IMPLANT
DRAPE CHEST BREAST 15X10 FENES (DRAPES) ×3 IMPLANT
DRAPE ORTHO SPLIT 87X125 STRL (DRAPES) ×3 IMPLANT
ELECT REM PT RETURN 9FT ADLT (ELECTROSURGICAL) ×3
ELECTRODE REM PT RTRN 9FT ADLT (ELECTROSURGICAL) ×2 IMPLANT
GAUZE 4X4 16PLY RFD (DISPOSABLE) IMPLANT
GLOVE BIO SURGEON STRL SZ7.5 (GLOVE) ×3 IMPLANT
GLOVE BIOGEL PI IND STRL 8 (GLOVE) ×4 IMPLANT
GLOVE BIOGEL PI INDICATOR 8 (GLOVE) ×2
GOWN STRL REUS W/ TWL LRG LVL3 (GOWN DISPOSABLE) ×4 IMPLANT
GOWN STRL REUS W/ TWL XL LVL3 (GOWN DISPOSABLE) ×4 IMPLANT
GOWN STRL REUS W/TWL LRG LVL3 (GOWN DISPOSABLE) ×2
GOWN STRL REUS W/TWL XL LVL3 (GOWN DISPOSABLE) ×2
HEMOSTAT SPONGE AVITENE ULTRA (HEMOSTASIS) IMPLANT
KIT BASIN OR (CUSTOM PROCEDURE TRAY) ×3 IMPLANT
KIT TURNOVER KIT B (KITS) ×3 IMPLANT
NEEDLE 18GX1X1/2 (RX/OR ONLY) (NEEDLE) ×3 IMPLANT
NEEDLE HYPO 25GX1X1/2 BEV (NEEDLE) ×3 IMPLANT
NS IRRIG 1000ML POUR BTL (IV SOLUTION) ×3 IMPLANT
PACK CV ACCESS (CUSTOM PROCEDURE TRAY) ×3 IMPLANT
PACK SURGICAL SETUP 50X90 (CUSTOM PROCEDURE TRAY) ×3 IMPLANT
PAD ARMBOARD 7.5X6 YLW CONV (MISCELLANEOUS) ×6 IMPLANT
SET MICROPUNCTURE 5F STIFF (MISCELLANEOUS) IMPLANT
SOAP 2 % CHG 4 OZ (WOUND CARE) IMPLANT
SUT ETHILON 3 0 PS 1 (SUTURE) ×3 IMPLANT
SUT MNCRL AB 4-0 PS2 18 (SUTURE) ×3 IMPLANT
SUT PROLENE 6 0 BV (SUTURE) ×3 IMPLANT
SUT PROLENE 7 0 BV 1 (SUTURE) IMPLANT
SUT SILK 4 0 (SUTURE) ×2
SUT SILK 4-0 18XBRD TIE 12 (SUTURE) ×4 IMPLANT
SUT VIC AB 3-0 SH 27 (SUTURE) ×1
SUT VIC AB 3-0 SH 27X BRD (SUTURE) ×2 IMPLANT
SYR 10ML LL (SYRINGE) ×3 IMPLANT
SYR 20ML LL LF (SYRINGE) ×6 IMPLANT
SYR 5ML LL (SYRINGE) ×3 IMPLANT
SYR CONTROL 10ML LL (SYRINGE) ×3 IMPLANT
TOWEL GREEN STERILE (TOWEL DISPOSABLE) ×3 IMPLANT
TOWEL GREEN STERILE FF (TOWEL DISPOSABLE) ×3 IMPLANT
UNDERPAD 30X30 (UNDERPADS AND DIAPERS) ×3 IMPLANT
WATER STERILE IRR 1000ML POUR (IV SOLUTION) ×3 IMPLANT
WIRE AMPLATZ SS-J .035X180CM (WIRE) IMPLANT

## 2019-03-30 NOTE — Op Note (Signed)
OPERATIVE NOTE   PROCEDURE: 1.  Ultrasound-guided access of the right internal jugular vein 2.  Placement of right internal jugular vein tunneled dialysis catheter (23 cm palindrome) 3.  Left brachiocephalic arteriovenous fistula placement  PRE-OPERATIVE DIAGNOSIS: End-stage renal disease  POST-OPERATIVE DIAGNOSIS: same as above   SURGEON: Marty Heck, MD  ASSISTANT(S): Marlinda Mike, PA  ANESTHESIA: LMA  ESTIMATED BLOOD LOSS: Minimal  FINDING(S): 1.  Placement of 23 cm palindrome catheter in the right internal jugular vein with the tip of the catheter in the right atrium 2.  Cephalic vein: 4 mm, acceptable 3.  Brachial artery: 4 mm, atherosclerotic disease evident 4.  Venous outflow: palpable thrill  5.  Radial flow: dopplerable radial signal  SPECIMEN(S):  none  INDICATIONS:   Kyle [name] is a 69 y.o. male who presents with end-stage renal disease and the need to transition from peritoneal dialysis to intermittent hemodialysis.  Vascular surgery was consulted for placement of permanent AV fistula access as well as a tunneled dialysis catheter.  The patient is aware the risks include but are not limited to: bleeding, infection, steal syndrome, nerve damage, ischemic monomelic neuropathy, failure to mature, and need for additional procedures.  The patient is aware of the risks of the procedure and elects to proceed forward.   DESCRIPTION: After full informed written consent was obtained from the patient, the patient was brought back to the operating room and placed supine upon the operating table.  Prior to induction, the patient received IV antibiotics.   After obtaining adequate anesthesia, the patient was then prepped and draped in the standard fashion for a tunneled dialysis dialysis catheter in the neck as well as left arm AV fistula placement.  Initially turned my attention to the right internal jugular vein.  This was evaluated with sterile ultrasound  probe, it was patent, and image was saved.  I then accessed the right internal jugular vein under ultrasound guidance with a 18-gauge needle.  I threaded a J-wire into the right atrium under fluoroscopic guidance.  That point in time I selected a 23 cm palindrome catheter that was measured on the chest wall.  I then made a counterincision in the right chest wall with a 11 blade scalpel as well as skin incision at our stick site in the right neck where we accessed internal jugular vein.  I then used the tunneler and tunneled from the chest wall to the IJ access site.  At that point in time the 23 cm palindrome catheter was brought on the field and cut at mark #1.  It was attached to the tunneler and then tunneled through the chest wall burying the cuff under the skin.  I then used sequential dilators over the wire under fluoroscopic guidance guidance and placed a large dilator peel-away sheath in the right atrium.  At that point in time the inner dilator and wire were removed.  I then placed the tip of the catheter into the right atrium and peeled away the sheath.  The catheter was pulled back slightly to make sure it was in the right atrium while still keeping the cuff under the skin.  Once I was happy the catheter was cut at mark #2 and all adapters were connected.  I then flushed and aspirated the catheter without any resistance.  I did check to ensure the catheter was not kinked under fluoroscopic guidance.  I then secured to the chest wall with several 3-0 nylons.  This neck incision  was then closed with a 4-0 subcuticular.  Dermabond was applied to all incisions.  Sterile dressings were applied.  The catheter was loaded with heparinized saline according to manufacturer's recommendations.  I then turned my attention to the left arm that has already been prepped and draped.  Patient had a very nice cephalic vein down to the wrist.  However the radial artery was fairly calcified and diseased and only measured  about 1.8 mm.  Subsequently elected to place a brachiocephalic fistula below the elbow where the artery looked healthier and was obviously larger.  Using SonoSite guidance, the location of these vessels were marked out on the skin.   I made a transverse incision below level of the antecubitum in the left arm and dissected through the subcutaneous tissue and fascia to gain exposure of the brachial artery.  This was noted to be 4 mm in diameter externally.  This was dissected out proximally and distally and controlled with vessel loops .  I then dissected out the cephalic vein.  This was noted to be 4 mm in diameter externally.  The distal segment of the vein was ligated with a  2-0 silk, and the vein was transected.  The proximal segment was interrogated with serial dilators.  The vein accepted up to a 4 mm dilator without any difficulty.  I then instilled the heparinized saline into the vein and clamped it.  At this point, I reset my exposure on the brachial artery.  The patient was given 3000 units IV heparin.  The artery was placed under tension proximally and distally.  I made an arteriotomy with a #11 blade, and then I extended the arteriotomy with a Potts scissor.    The vein was then sewn to the artery in an end-to-side configuration with a running stitch of 6-0 Prolene.  Prior to completing this anastomosis, I allowed the vein and artery to backbleed.  There was no evidence of clot from any vessels.  I completed the anastomosis in the usual fashion and then released all vessel loops and clamps.    There was a palpable thrill in the venous outflow, and there was a dopplerable radial signal.  At this point, I irrigated out the surgical wound.  There was no further active bleeding.  The subcutaneous tissue was reapproximated with a running stitch of 3-0 Vicryl.  The skin was then reapproximated with a running subcuticular stitch of 4-0 Monocryl.  The skin was then cleaned, dried, and reinforced with Dermabond.   The patient tolerated this procedure well.   COMPLICATIONS: None  CONDITION: Stable   Marty Heck, MD [facility]: [phone]  03/30/2019, 9:21 AM

## 2019-03-30 NOTE — Progress Notes (Signed)
Patient returned from procedural area. I J placed on the right, AV fistula placed in LUE. Patient is drowsy  but denies pain. All morning meds except Lantus and levetracium held per MD verbal order.

## 2019-03-30 NOTE — Progress Notes (Addendum)
[facility] ROUNDING NOTE   Subjective:  Patient had placement of tunneled catheter and left AVF today given that he is to be placed in SNF.  Spoke with his nurse and he got 1 unit of blood downstairs for critical Hb.  He states PD went ok last night.    Review of systems: denies shortness of breath or chest pain Denies n/v Denies blood per rectum  --------------------------- Background information:   This is a 69 year old gentleman with a history of diabetes mellitus type 2 end-stage renal disease on peritoneal dialysis seizure disorder COPD morbid obesity gait disorder with falls chronic diarrhea admitted to [facility] on 03/12/2019 with a history of significant hypercalcemia confusion and weakness.  And having missed dialysis for 3 days.  MRI of the brain for left-sided ptosis revealed an acute left paramedian pons and old infarcts in the left paramedian pons chronic  small vessel disease.  Ejection fraction 65 to 30% grade 1 diastolic dysfunction carotid Dopplers negative for ICA stenosis.  He continues on peritoneal dialysis.  He is using all 1.5% dextrose with successful ultrafiltration.  Patient for placement in skilled nursing facility will need to transition to intermittent hemodialysis    Objective:  Vital signs in last 24 hours:  Temp:  [97 F (36.1 C)-99 F (37.2 C)] 97 F (36.1 C) (02/01 0940) Pulse Rate:  [74-85] 75 (02/01 1010) Resp:  [13-20] 13 (02/01 1010) BP: (115-150)/(54-70) 115/54 (02/01 1010) SpO2:  [93 %-99 %] 94 % (02/01 1010) Weight:  [92.7 kg-95.8 kg] 95.7 kg (02/01 0641)  Weight change: 1.1 kg Filed Weights   03/29/19 1650 03/30/19 0600 03/30/19 0641  Weight: 95.8 kg 92.7 kg 95.7 kg    Intake/Output: I/O last 3 completed shifts: In: 517 [P.O.:517] Out: 2 [Urine:1; Stool:1]   Intake/Output this shift:  Total I/O In: 815 [I.V.:500; Blood:315] Out: 10 [Blood:10]  QMV:HQIONGEXBMW ill, NAD  HENT NCAT   CV: S1S2  no rub   PULM:Clear bilaterally, normal work of breathing, UXL:KGMW, nontender. ND PD catheter intact  SKIN:No rashes or lesions EXT:No significant edema   Access: RIJ tunneled dialysis catheter; LUE AVF bruit and thrill    Basic Metabolic Panel: Recent Labs  Lab 03/26/19 0530 03/26/19 0530 03/27/19 0526 03/27/19 0526 03/28/19 0657 03/29/19 0706 03/30/19 0610 03/30/19 0708  NA 141   < > 137  --  136 138 136 135  K 5.0   < > 4.6  --  4.6 4.3 4.0 4.2  CL 100   < > 102  --  98 101 99 98  CO2 26  --  24  --  25 26 24   --   GLUCOSE 168*   < > 170*  --  214* 139* 137* 121*  BUN 35*   < > 39*  --  40* 44* 44* 41*  CREATININE 8.83*   < > 9.05*  --  8.68* 8.64* 8.37* 9.20*  CALCIUM 9.1   < > 8.9   < > 8.7* 8.6* 8.4*  --   PHOS 3.8  --  4.7*  --  4.9* 5.0* 4.9*  --    < > = values in this interval not displayed.    Liver Function Tests: Recent Labs  Lab 03/26/19 0530 03/27/19 0526 03/28/19 0657 03/29/19 0706 03/30/19 0610  ALBUMIN 2.0* 1.9* 1.9* 1.9* 1.8*   CBC: Recent Labs  Lab 03/30/19 0610 03/30/19 0708  WBC 9.0  --   HGB 6.2* 5.8*  HCT 19.1* 17.0*  MCV 103.2*  --   PLT 231  --     CBG: Recent Labs  Lab 03/29/19 1621 03/29/19 2120 03/30/19 0607 03/30/19 0939 03/30/19 1150  GLUCAP 200* 240* 133* 109* 170*    Microbiology: Results for orders placed or performed during the hospital encounter of 03/20/19  Culture, Urine     Status: None   Collection Time: 03/22/19  4:15 PM   Specimen: Urine, Catheterized  Result Value Ref Range Status   Specimen Description URINE, CATHETERIZED  Final   Special Requests SITE NOT SPECIFIED  Final   Culture   Final    NO GROWTH Performed at [facility], [place] [address], [place], [place] [address]    Report Status 03/23/2019 FINAL  Final    Imaging: X-ray chest PA or AP  Result Date: 03/30/2019 CLINICAL DATA:  Postop right dialysis catheter EXAM: CHEST  1 VIEW COMPARISON:  CT chest dated 03/18/2019  FINDINGS: Mild patchy left lower lobe opacity, new, likely atelectasis. Right lung is clear. No pleural effusion or pneumothorax. The heart is top-normal in size. Right IJ dual lumen dialysis catheter terminates in the lower SVC. Old left posterior rib fracture deformity. IMPRESSION: Right IJ dual lumen dialysis catheter terminates in the lower SVC. Mild patchy left lower lobe opacity, likely atelectasis. Electronically Signed   By: Julian Hy M.D.   On: 03/30/2019 09:51   DG Fluoro Guide CV Line-No Report  Result Date: 03/30/2019 Fluoroscopy was utilized by the requesting physician.  No radiographic interpretation.     Medications:   . dialysis solution 1.5% low-MG/low-CA     . amLODipine  5 mg Oral Daily  . aspirin EC  81 mg Oral Daily  . atorvastatin  80 mg Oral Daily  . calcium acetate  1,334 mg Oral TID WC  . Chlorhexidine Gluconate Cloth  6 each Topical Daily  . clopidogrel  75 mg Oral Daily  . darbepoetin (ARANESP) injection - NON-DIALYSIS  25 mcg Subcutaneous Q Sun-1800  . feeding supplement (NEPRO CARB STEADY)  237 mL Oral BID BM  . gabapentin  100 mg Oral BID  . gentamicin cream  1 application Topical Daily  . heparin  5,000 Units Subcutaneous Q8H  . insulin aspart  0-15 Units Subcutaneous TID WC  . insulin glargine  30 Units Subcutaneous Daily  . levETIRAcetam  500 mg Oral BID  . levothyroxine  200 mcg Oral q morning - 10a  . lipase/protease/amylase  24,000 Units Oral TID WC  . metoprolol succinate  50 mg Oral Daily  . modafinil  100 mg Oral Daily  . multivitamin  1 tablet Oral Daily   acetaminophen, bisacodyl, guaiFENesin-dextromethorphan, dianeal solution for CAPD/CCPD with heparin, polyethylene glycol, prochlorperazine **OR** prochlorperazine **OR** prochlorperazine, Resource ThickenUp Clear  Assessment/ Plan:   ESRD-peritoneal dialysis diabetic nephropathy uses CCPD 2.5 L with 5 exchanges no pauses.  Continues on 1.5% bags with good ultrafiltration.    - For  placement at SNF where peritoneal dialysis will not be be able to be performed.   - We will therefore transition to hemodialysis - for HD tomorrow, 2/2 - PD tonight    Anemia - question acute blood loss as well as CKD - iron saturation 20% - s/p Ferrlecit and darbepoetin 25 mcg weekly given on 03/29/2019. May have been off of ESA with CVA? Will need to inc dose as able.  Note severe anemia on 2/1.  Got 1 unit PRBC's today. Update Hb and may need another unit.  Anticoagulation  per primary team - s/p CVA.   History of hypokalemia. Acceptable   Bone mineral continue binders vitamin D.  Continues on PhosLo with meals. vitamin D discontinued due to hypercalcemia at this point.  PTH 91.  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

## 2019-03-30 NOTE — Transfer of Care (Signed)
Immediate Anesthesia Transfer of Care Note  Patient: Kyle [name]  Procedure(s) Performed: right brachiocephalic fistula creation (Left Arm Upper) Ultrasound guided right internal jugular tunneled dialysis catheter placement (Right Neck)  Patient Location: PACU  Anesthesia Type:General  Level of Consciousness: drowsy and patient cooperative  Airway & Oxygen Therapy: Patient Spontanous Breathing  Post-op Assessment: Report given to RN, Post -op Vital signs reviewed and stable and Patient moving all extremities X 4  Post vital signs: Reviewed and stable  Last Vitals:  Vitals Value Taken Time  BP 123/60 03/30/19 0940  Temp    Pulse 73 03/30/19 0944  Resp 16 03/30/19 0944  SpO2 92 % 03/30/19 0944  Vitals shown include unvalidated device data.  Last Pain:  Vitals:   03/30/19 0641  TempSrc: Oral  PainSc: Asleep      Patients Stated Pain Goal: 0 (73/40/37 0964)  Complications: No apparent anesthesia complications

## 2019-03-30 NOTE — Anesthesia Procedure Notes (Signed)
Procedure Name: LMA Insertion Date/Time: 03/30/2019 7:50 AM Performed by: Larene Beach, CRNA Pre-anesthesia Checklist: Patient identified, Emergency Drugs available, Suction available and Patient being monitored Patient Re-evaluated:Patient Re-evaluated prior to induction Oxygen Delivery Method: Circle system utilized Preoxygenation: Pre-oxygenation with 100% oxygen Induction Type: IV induction Ventilation: Mask ventilation without difficulty LMA: LMA inserted LMA Size: 5.0 Number of attempts: 1 Airway Equipment and Method: Stylet and Oral airway Placement Confirmation: positive ETCO2,  breath sounds checked- equal and bilateral and CO2 detector Tube secured with: Tape Dental Injury: Teeth and Oropharynx as per pre-operative assessment

## 2019-03-30 NOTE — Progress Notes (Signed)
Renal Navigator received call from Dr. Shelda Altes that patient will need SNF placement after CIR and is currently on PD. He will need transition to HD in order for SNF to accommodate. Renal Navigator spoke with CIR CSWs L. Hoyle and D. Sharp to ask that once a patient has been accepted at [facility] and patient is willing to go, Renal Navigator will refer for OP HD seat. Seat acceptance generally takes 24-48 hours in typical cases. Navigator will try to keep patient with [facility] if possible, as this is who he is with for PD. Renal Navigator will follow and await call from CIR CSW.  Alphonzo Cruise, [place] Renal Navigator [phone]

## 2019-03-30 NOTE — Progress Notes (Signed)
[place] PHYSICAL MEDICINE & REHABILITATION PROGRESS NOTE   Subjective/Complaints:  Just returned from AV Graft placement and tunneled Right  IJ line for HD   ROS: Patient denies nausea, vomiting, diarrhea, cough, shortness of breath or chest pain, joint or back pain, headache, or mood change.    Objective:   X-ray chest PA or AP  Result Date: 03/30/2019 CLINICAL DATA:  Postop right dialysis catheter EXAM: CHEST  1 VIEW COMPARISON:  CT chest dated 03/18/2019 FINDINGS: Mild patchy left lower lobe opacity, new, likely atelectasis. Right lung is clear. No pleural effusion or pneumothorax. The heart is top-normal in size. Right IJ dual lumen dialysis catheter terminates in the lower SVC. Old left posterior rib fracture deformity. IMPRESSION: Right IJ dual lumen dialysis catheter terminates in the lower SVC. Mild patchy left lower lobe opacity, likely atelectasis. Electronically Signed   By: Julian Hy M.D.   On: 03/30/2019 09:51   DG Fluoro Guide CV Line-No Report  Result Date: 03/30/2019 Fluoroscopy was utilized by the requesting physician.  No radiographic interpretation.   VAS [place] UPPER EXT VEIN MAPPING (PRE-OP AVF)  Result Date: 03/29/2019 UPPER EXTREMITY VEIN MAPPING  Indications: Pre dialysis access. History: ESRD.  Comparison Study: No prior Performing Technologist: Sharion Dove RVS  Examination Guidelines: A complete evaluation includes B-mode imaging, spectral Doppler, color Doppler, and power Doppler as needed of all accessible portions of each vessel. Bilateral testing is considered an integral part of a complete examination. Limited examinations for reoccurring indications may be performed as noted. +-----------------+-------------+----------+---------+ Right Cephalic   Diameter (cm)Depth (cm)Findings  +-----------------+-------------+----------+---------+ Shoulder             0.31        0.17             +-----------------+-------------+----------+---------+ Prox  upper arm       0.26        0.24             +-----------------+-------------+----------+---------+ Mid upper arm        0.26        0.24             +-----------------+-------------+----------+---------+ Dist upper arm       0.24        0.48             +-----------------+-------------+----------+---------+ Antecubital fossa    0.38        0.41             +-----------------+-------------+----------+---------+ Prox forearm         0.46        0.45   branching +-----------------+-------------+----------+---------+ Mid forearm          0.50        0.33   branching +-----------------+-------------+----------+---------+ Wrist                0.23        0.32             +-----------------+-------------+----------+---------+ +-----------------+-------------+----------+----------------------+ Right Basilic    Diameter (cm)Depth (cm)       Findings        +-----------------+-------------+----------+----------------------+ Prox upper arm       0.70        1.15                          +-----------------+-------------+----------+----------------------+ Mid upper arm        0.74  0.90                          +-----------------+-------------+----------+----------------------+ Dist upper arm       0.57        0.77         branching        +-----------------+-------------+----------+----------------------+ Antecubital fossa    0.66        0.75          thrombus        +-----------------+-------------+----------+----------------------+ Prox forearm         0.64        0.58   branching and thrombus +-----------------+-------------+----------+----------------------+ Mid forearm          0.45        0.40         branching        +-----------------+-------------+----------+----------------------+ Wrist                0.22        0.23                          +-----------------+-------------+----------+----------------------+ Thrombus noted in right  basilic at [facility] and proximal forearm. +-----------------+-------------+----------+---------+ Left Cephalic    Diameter (cm)Depth (cm)Findings  +-----------------+-------------+----------+---------+ Shoulder             0.41        0.94             +-----------------+-------------+----------+---------+ Prox upper arm       0.43        0.27             +-----------------+-------------+----------+---------+ Mid upper arm        0.37        0.24             +-----------------+-------------+----------+---------+ Dist upper arm       0.50        0.30             +-----------------+-------------+----------+---------+ Antecubital fossa    0.69        0.23             +-----------------+-------------+----------+---------+ Prox forearm         0.57        0.61   branching +-----------------+-------------+----------+---------+ Mid forearm          0.36        0.25             +-----------------+-------------+----------+---------+ Wrist                0.42        0.36             +-----------------+-------------+----------+---------+ *See table(s) above for measurements and observations.  Diagnosing physician: Servando Snare MD Electronically signed by Servando Snare MD on 03/29/2019 at 10:54:34 AM.    Final    Recent Labs    03/30/19 0610 03/30/19 0708  WBC 9.0  --   HGB 6.2* 5.8*  HCT 19.1* 17.0*  PLT 231  --    Recent Labs    03/29/19 0706 03/29/19 0706 03/30/19 0610 03/30/19 0708  NA 138   < > 136 135  K 4.3   < > 4.0 4.2  CL 101   < > 99 98  CO2 26  --  24  --   GLUCOSE 139*   < > 137* 121*  BUN 44*   < > 44* 41*  CREATININE 8.64*   < > 8.37* 9.20*  CALCIUM 8.6*  --  8.4*  --    < > = values in this interval not displayed.    Intake/Output Summary (Last 24 hours) at 03/30/2019 1031 Last data filed at 03/30/2019 9675 Gross per 24 hour  Intake 1332 ml  Output 10 ml  Net 1322 ml     Physical Exam: Vital Signs Blood pressure (!) 115/54, pulse 75,  temperature (!) 97 F (36.1 C), resp. rate 13, height 6' (1.829 m), weight 95.7 kg, SpO2 94 %. Constitutional: No distress . Vital signs reviewed. HEENT: EOMI, oral membranes moist. Left eye shut, but he can open it when asked.  Neck: supple Cardiovascular: RRR without murmur. No JVD    Respiratory: CTA Bilaterally without wheezes or rales. Normal effort    GI: BS +, non-tender, non-distended   Musculoskeletal:  General: Normal range of motion.  no pain with ROM  Neurological: lethargic. Followed basic commands ie pick up R foot, pick up left foot, did not squeeze to command .c. Left CN III palsy with ptosiscould not do full MMT due to lethargy Skin: Skin iswarm.  Psychiatric: flat    Assessment/Plan: 1. 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  Physiatrist is providing close team supervision and 24 hour management of active medical problems listed below.  Physiatrist and rehab team continue to assess barriers to discharge/monitor patient progress toward functional and medical goals  Care Tool:  Bathing  Bathing activity did not occur: Refused Body parts bathed by patient: Right arm, Left arm, Chest, Abdomen, Front perineal area, Right upper leg, Left upper leg, Face   Body parts bathed by helper: Buttocks, Right lower leg, Left lower leg     Bathing assist Assist Level: Moderate Assistance - Patient 50 - 74%     Upper Body Dressing/Undressing Upper body dressing   What is the patient wearing?: Pull over shirt    Upper body assist Assist Level: Minimal Assistance - Patient > 75%    Lower Body Dressing/Undressing Lower body dressing      What is the patient wearing?: Incontinence brief, Pants     Lower body assist Assist for lower body dressing: Maximal Assistance - Patient 25 - 49%     Toileting Toileting Toileting Activity did  not occur (Clothing management and hygiene only): N/A (no void or bm)  Toileting assist Assist for toileting: Moderate Assistance - Patient 50 - 74%     Transfers Chair/bed transfer  Transfers assist  Chair/bed transfer activity did not occur: Safety/medical concerns  Chair/bed transfer assist level: Minimal Assistance - Patient > 75%     Locomotion Ambulation   Ambulation assist      Assist level: Moderate Assistance - Patient 50 - 74% Assistive device: Hand held assist Max distance: 50   Walk 10 feet activity   Assist     Assist level: Moderate Assistance - Patient - 50 - 74% Assistive device: No Device, Hand held assist   Walk 50 feet activity   Assist Walk 50 feet with 2 turns activity did not occur: Safety/medical concerns  Assist level: Moderate Assistance - Patient - 50 - 74% Assistive device: No Device, Hand held assist    Walk 150 feet activity   Assist Walk 150 feet activity did not occur: Safety/medical concerns  Walk 10 feet on uneven surface  activity   Assist Walk 10 feet on uneven surfaces activity did not occur: Safety/medical concerns(Per report)         Wheelchair     Assist   Type of Wheelchair: Manual    Wheelchair assist level: Minimal Assistance - Patient > 75% Max wheelchair distance: 142ft    Wheelchair 50 feet with 2 turns activity    Assist        Assist Level: Minimal Assistance - Patient > 75%   Wheelchair 150 feet activity     Assist      Assist Level: Moderate Assistance - Patient 50 - 74%   Blood pressure (!) 115/54, pulse 75, temperature (!) 97 F (36.1 C), resp. rate 13, height 6' (1.829 m), weight 95.7 kg, SpO2 94 %.  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 Antithrombotics: -DVT/anticoagulation:Pharmaceutical:Heparin -antiplatelet therapy: ASA/PLAVIX X 3 weeks followed by ASA alone. 3. Pain Management:tylenol prn 4. Mood:LCSW to follow for evaluation and support. -antipsychotic agents: N/A 5. Neuropsych: This patientiscapable of making decisions on hisown behalf. 6. Skin/Wound Care:Routine pressure relief measures. 7. Fluids/Electrolytes/Nutrition:Monitor I/O. Has been refusing nectar liquids. Push PO 8. T2DM with neuropathy/nephropathy: Hgb A1c- Was on ozempic, Lantus, Humalog and glucotrol PTA.            CBG (last 3)  Recent Labs    03/29/19 2120 03/30/19 0607 03/30/19 0939  GLUCAP 240* 133* 109*  1/31: borderline control, continue to monitor.  9. ESRD- on PD at nights.Will convert to HD due to SNF placement,Has IJ, received PD last noc, ? When first HD treatment will be, as per nephro   10. Hypokalemia/Hypomagnesemia: supps per nephro. - nephro ordered KCL 69meq TID 11. Anemia of chronic disease: On aranesp weekly. Hgb 7.9- stable  12. Chronic diarrhea:Continue creon tid with meals.   -no diarrhea currently 14.HTN: Monitor BP tid--on metoprolol, losartan and low dose amlodipine             1/24 fair control  1/30: labile  1/31: well controlled.  15. Chronic dizziness/Orthostatic changes?:  no dizziness reported   -will check orthostatic VS x 2 days 16. Prolonged QTC: QTC 528 on EKG 1/14 17. Lethargy: intermittent- likely CVA related now post op      LOS: 10 days A FACE TO Chance E Nadia Viar 03/30/2019, 10:31 AM

## 2019-03-30 NOTE — Progress Notes (Signed)
Physical Therapy Note  Patient Details  Name: Kyle [name] MRN: [id number] Date of Birth: Jun 28, 1950 Today's Date: 03/30/2019    Pt missed 75 min skilled PT today due to medical hold. Pt with AV fistula placement this am. Will continue therapies 2/2 per MD order.    Trevaun Rendleman 03/30/2019, 12:55 PM

## 2019-03-30 NOTE — Anesthesia Postprocedure Evaluation (Signed)
Anesthesia Post Note  Patient: Kyle [name]  Procedure(s) Performed: right brachiocephalic fistula creation (Left Arm Upper) Ultrasound guided right internal jugular tunneled dialysis catheter placement (Right Neck)     Patient location during evaluation: PACU Anesthesia Type: General Level of consciousness: awake and alert Pain management: pain level controlled Vital Signs Assessment: post-procedure vital signs reviewed and stable Respiratory status: spontaneous breathing, nonlabored ventilation, respiratory function stable and patient connected to nasal cannula oxygen Cardiovascular status: blood pressure returned to baseline and stable Postop Assessment: no apparent nausea or vomiting Anesthetic complications: no    Last Vitals:  Vitals:   03/30/19 1010 03/30/19 1345  BP: (!) 115/54 136/83  Pulse: 75 81  Resp: 13 19  Temp:  37 C  SpO2: 94% 96%    Last Pain:  Vitals:   03/30/19 1010  TempSrc:   PainSc: 0-No pain                 Jasmarie Coppock COKER

## 2019-03-30 NOTE — Anesthesia Preprocedure Evaluation (Signed)
Anesthesia Evaluation  Patient identified by MRN, date of birth, ID band Patient awake    Reviewed: Allergy & Precautions, NPO status , Patient's Chart, lab work & pertinent test results  Airway Mallampati: II  TM Distance: >3 FB Neck ROM: Full    Dental  (+) Teeth Intact, Dental Advisory Given   Pulmonary Current Smoker,    breath sounds clear to auscultation       Cardiovascular hypertension,  Rhythm:Regular Rate:Normal     Neuro/Psych    GI/Hepatic   Endo/Other  diabetes  Renal/GU      Musculoskeletal   Abdominal   Peds  Hematology   Anesthesia Other Findings   Reproductive/Obstetrics                             Anesthesia Physical Anesthesia Plan  ASA: III  Anesthesia Plan: General   Post-op Pain Management:    Induction: Intravenous  PONV Risk Score and Plan: Ondansetron  Airway Management Planned: LMA  Additional Equipment:   Intra-op Plan:   Post-operative Plan:   Informed Consent: I have reviewed the patients History and Physical, chart, labs and discussed the procedure including the risks, benefits and alternatives for the proposed anesthesia with the patient or authorized representative who has indicated his/her understanding and acceptance.     Dental advisory given  Plan Discussed with: CRNA and Anesthesiologist  Anesthesia Plan Comments: (Hgb 5.8 by iStat last Hgb from lab 24.7 1/25. Will recheck CBC.  Plan GA with LMA.)        Anesthesia Quick Evaluation

## 2019-03-30 NOTE — Progress Notes (Signed)
Speech Language Pathology Daily Session Note  Patient Details  Name: Kyle [name] MRN: [id number] Date of Birth: 01/14/1951  Today's Date: 03/30/2019    Short Term Goals: Week 2: SLP Short Term Goal 1 (Week 2): Pt will consume current diet with minimal overt s/s of aspiration/dysphagia and Min A cues for use of safe swallow strategies. SLP Short Term Goal 2 (Week 2): Pt will demonstrate use of speech intelligibility strategies (over articulation and slow rate) at sentence level for 90% intelligibility with min A verbal cues. SLP Short Term Goal 3 (Week 2): Pt will demonstrate sustained attention in 15 minute intervals with min A verbal cues for redirection during functional tasks. SLP Short Term Goal 4 (Week 2): Pt will answer yes/no quesitons to demonstrate intellectual awareness of cognition/communication/swallow deficits and physical deficits in 5 out of 10 opportunities with Min A cues. SLP Short Term Goal 5 (Week 2): Pt will complete functional and basic problem solving tasks with min A verbal cues. SLP Short Term Goal 6 (Week 2): Pt will utilize external compensatory memory strategies to recall information in 5 out of 10 opportunities with Min A cues.  Pt missed 60 mins skilled ST today, as he was off the unit for a procedure and team requesting no therapy for 2/1.       Arbutus Leas 03/30/2019, 11:52 AM

## 2019-03-30 NOTE — Progress Notes (Signed)
[facility]  Subjective  - plan to transition to iHD.   Objective 132/62 82 98.6 F (37 C) (Oral) 16 98%  Intake/Output Summary (Last 24 hours) at 03/30/2019 0730 Last data filed at 03/29/2019 2056 Gross per 24 hour  Intake 517 ml  Output 2 ml  Net 515 ml    Palpable bilateral radial pulses  Laboratory Lab Results: Recent Labs    03/30/19 0610 03/30/19 0708  WBC 9.0  --   HGB 6.2* 5.8*  HCT 19.1* 17.0*  PLT 231  --    BMET Recent Labs    03/29/19 0706 03/29/19 0706 03/30/19 0610 03/30/19 0708  NA 138   < > 136 135  K 4.3   < > 4.0 4.2  CL 101   < > 99 98  CO2 26  --  24  --   GLUCOSE 139*   < > 137* 121*  BUN 44*   < > 44* 41*  CREATININE 8.64*   < > 8.37* 9.20*  CALCIUM 8.6*  --  8.4*  --    < > = values in this interval not displayed.    COAG Lab Results  Component Value Date   INR 0.9 09/17/2018   No results found for: PTT  Assessment/Planning:  Plan for left arm AVF (nice cephalic vein) and TDC placement.  Risks and benefits discussed.   Marty Heck 03/30/2019 7:30 AM --

## 2019-03-31 ENCOUNTER — Inpatient Hospital Stay (HOSPITAL_COMMUNITY): Payer: Medicare PPO | Admitting: Physical Therapy

## 2019-03-31 ENCOUNTER — Inpatient Hospital Stay (HOSPITAL_COMMUNITY): Payer: Medicare PPO | Admitting: Occupational Therapy

## 2019-03-31 ENCOUNTER — Inpatient Hospital Stay (HOSPITAL_COMMUNITY): Payer: Medicare PPO | Admitting: Speech Pathology

## 2019-03-31 LAB — CBC
HCT: 20.3 % — ABNORMAL LOW (ref 39.0–52.0)
Hemoglobin: 6.7 g/dL — CL (ref 13.0–17.0)
MCH: 33.5 pg (ref 26.0–34.0)
MCHC: 33 g/dL (ref 30.0–36.0)
MCV: 101.5 fL — ABNORMAL HIGH (ref 80.0–100.0)
Platelets: 225 10*3/uL (ref 150–400)
RBC: 2 MIL/uL — ABNORMAL LOW (ref 4.22–5.81)
RDW: 15.6 % — ABNORMAL HIGH (ref 11.5–15.5)
WBC: 13.2 10*3/uL — ABNORMAL HIGH (ref 4.0–10.5)
nRBC: 0 % (ref 0.0–0.2)

## 2019-03-31 LAB — RENAL FUNCTION PANEL
Albumin: 1.9 g/dL — ABNORMAL LOW (ref 3.5–5.0)
Anion gap: 15 (ref 5–15)
BUN: 56 mg/dL — ABNORMAL HIGH (ref 8–23)
CO2: 22 mmol/L (ref 22–32)
Calcium: 7.8 mg/dL — ABNORMAL LOW (ref 8.9–10.3)
Chloride: 96 mmol/L — ABNORMAL LOW (ref 98–111)
Creatinine, Ser: 9.17 mg/dL — ABNORMAL HIGH (ref 0.61–1.24)
GFR calc Af Amer: 6 mL/min — ABNORMAL LOW (ref >60)
GFR calc non Af Amer: 5 mL/min — ABNORMAL LOW (ref >60)
Glucose, Bld: 307 mg/dL — ABNORMAL HIGH (ref 70–99)
Phosphorus: 4.4 mg/dL (ref 2.5–4.6)
Potassium: 4.4 mmol/L (ref 3.5–5.1)
Sodium: 133 mmol/L — ABNORMAL LOW (ref 135–145)

## 2019-03-31 LAB — GLUCOSE, CAPILLARY
Glucose-Capillary: 295 mg/dL — ABNORMAL HIGH (ref 70–99)
Glucose-Capillary: 312 mg/dL — ABNORMAL HIGH (ref 70–99)
Glucose-Capillary: 145 mg/dL — ABNORMAL HIGH (ref 70–99)
Glucose-Capillary: 253 mg/dL — ABNORMAL HIGH (ref 70–99)

## 2019-03-31 LAB — PREPARE RBC (CROSSMATCH)

## 2019-03-31 LAB — OCCULT BLOOD X 1 CARD TO LAB, STOOL: Fecal Occult Bld: POSITIVE — AB

## 2019-03-31 MED ORDER — LIDOCAINE HCL (PF) 1 % IJ SOLN
5.0000 mL | INTRAMUSCULAR | Status: DC | PRN
  Filled 2019-03-31: qty 5

## 2019-03-31 MED ORDER — SODIUM CHLORIDE 0.9 % IV SOLN: 100.0000 mL | INTRAVENOUS | Status: DC | PRN

## 2019-03-31 MED ORDER — LIDOCAINE-PRILOCAINE 2.5-2.5 % EX CREA
1.0000 "application " | TOPICAL_CREAM | CUTANEOUS | Status: DC | PRN
  Filled 2019-03-31: qty 5

## 2019-03-31 MED ORDER — ALTEPLASE 2 MG IJ SOLR: 2.0000 mg | Freq: Once | INTRAMUSCULAR | Status: DC | PRN

## 2019-03-31 MED ORDER — HEPARIN SODIUM (PORCINE) 1000 UNIT/ML IJ SOLN
INTRAMUSCULAR | Status: AC
  Administered 2019-03-31: 3400 [IU] via INTRAVENOUS_CENTRAL
  Filled 2019-03-31: qty 4

## 2019-03-31 MED ORDER — HEPARIN SODIUM (PORCINE) 1000 UNIT/ML DIALYSIS
1000.0000 [IU] | INTRAMUSCULAR | Status: DC | PRN
  Filled 2019-03-31 (×2): qty 1

## 2019-03-31 MED ORDER — PENTAFLUOROPROP-TETRAFLUOROETH EX AERO: 1.0000 "application " | INHALATION_SPRAY | CUTANEOUS | Status: DC | PRN

## 2019-03-31 MED ORDER — SODIUM CHLORIDE 0.9% IV SOLUTION
Freq: Once | INTRAVENOUS | Status: AC
  Administered 2019-04-04: 10 mL/h via INTRAVENOUS

## 2019-03-31 NOTE — Progress Notes (Signed)
LM for risk management to follow up with brothers concerns.  Brita Romp, RN

## 2019-03-31 NOTE — Progress Notes (Signed)
Received message that patients brother Rendon Howell) would like for charge nurse to call him back to discuss some concerns that he has regarding his brothers care.  He started the conversation saying that he called the patient last night around 9 pm and the patient told him that he had been sitting in his own feces for over an hour despite using the call bell for assistance.  Dominica Severin then called the nurses station to inquire about this and someone took care of the issue, whose name he couldn't remember.  When Dominica Severin asked the patient for more details, the patient told him that this isn't the first time this has happened.  Dominica Severin knows that the patients cognitive status is different than his baseline so he wanted to follow up with nursing staff.  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  He discussed his troubled relationship with the patients wife who is providing very little information to him.  During the conversation, Dominica Severin brought up that the patient had fallen in the ED at [facility] while trying to get up unassisted.  He was asking for specific details about that incident and has been trying to reach some administrative staff at [facility] to express his concerns but that each time he was told that there was no one available to help him.  He would greatly appreciate someone from risk management to call him regarding his concerns.  Informed Dominica Severin that I would reach out to risk management on his behalf and hopefully get some resolution.  Dominica Severin to call back tomorrow after team conference for update on patient.  Dominica Severin seemed relieved after our discussion as some of his questions were addressed during the call.  Will forward information on to risk management.  Brita Romp, RN

## 2019-03-31 NOTE — Plan of Care (Signed)
  Problem: Consults Goal: RH STROKE PATIENT EDUCATION Description: See Patient Education module for education specifics  Outcome: Progressing Goal: Nutrition Consult-if indicated Outcome: Progressing Goal: Diabetes Guidelines if Diabetic/Glucose > 140 Description: If diabetic or lab glucose is > 140 mg/dl - Initiate Diabetes/Hyperglycemia Guidelines & Document Interventions  Outcome: Progressing   Problem: RH BOWEL ELIMINATION Goal: RH STG MANAGE BOWEL WITH ASSISTANCE Description: STG Manage Bowel with mod Assistance. Outcome: Progressing Goal: RH STG MANAGE BOWEL W/MEDICATION W/ASSISTANCE Description: STG Manage Bowel with Medication with mod Assistance. Outcome: Progressing   Problem: RH SKIN INTEGRITY Goal: RH STG SKIN FREE OF INFECTION/BREAKDOWN Description: Skin to remain free from infection and breakdown while on rehab with min assist. Outcome: Progressing Goal: RH STG MAINTAIN SKIN INTEGRITY WITH ASSISTANCE Description: STG Maintain Skin Integrity With min Assistance. Outcome: Progressing Goal: RH STG ABLE TO PERFORM INCISION/WOUND CARE W/ASSISTANCE Description: STG Able To Perform Wound Care With mod Assistance. Outcome: Progressing   Problem: RH SAFETY Goal: RH STG ADHERE TO SAFETY PRECAUTIONS W/ASSISTANCE/DEVICE Description: STG Adhere to Safety Precautions With mod Assistance and appropriate assistive Device. Outcome: Progressing   Problem: RH PAIN MANAGEMENT Goal: RH STG PAIN MANAGED AT OR BELOW PT'S PAIN GOAL Description: <3 on a 0-10 pain scale. Outcome: Progressing   Problem: RH KNOWLEDGE DEFICIT Goal: RH STG INCREASE KNOWLEDGE OF DIABETES Description: Patient will increase knowledge on DM medications, dietary restrictions, and follow-up care with the MD after discharge with min assist from staff. Outcome: Progressing Goal: RH STG INCREASE KNOWLEDGE OF HYPERTENSION Description: Patient will increase knowledge of HTN medications, dietary restrictions, and  follow-up care with the MD after discharge with min assist from staff. Outcome: Progressing Goal: RH STG INCREASE KNOWLEDGE OF DYSPHAGIA/FLUID INTAKE Description: Patient will increase and display knowledge of dysphagia diets and thickened liquids appropriate for his dietary restrictions with min assist from staff. Outcome: Progressing Goal: RH STG INCREASE KNOWLEGDE OF HYPERLIPIDEMIA Description: Patient will increase knowledge of HLD medications and follow-up care with the MD after discharge with min assist from staff. Outcome: Progressing Goal: RH STG INCREASE KNOWLEDGE OF STROKE PROPHYLAXIS Description: Patient will increase knowledge of medications used to prevent future strokes and follow-up care with the MD after discharge with min assist from staff. Outcome: Progressing

## 2019-03-31 NOTE — Progress Notes (Signed)
Pt had critical lab value hemoglobin- 6.7. MD notified and ordered one unit of blood. Consent form signed by patient.

## 2019-03-31 NOTE — Progress Notes (Signed)
[facility]  Subjective  - sleepy.  Asked if there were any more procedures from my standpoint.   Objective 126/62 69 97.8 F (36.6 C) (Oral) 16 99%  Intake/Output Summary (Last 24 hours) at 03/31/2019 0804 Last data filed at 03/30/2019 2100 Gross per 24 hour  Intake 1175 ml  Output 10 ml  Net 1165 ml    Left brachiocephalic AVF with good thrill, palpable radial pulse at wrist Right IJ tunneled catheter - no hematoma, no drainage  Laboratory Lab Results: Recent Labs    03/30/19 1829 03/31/19 0541  WBC 12.5* 13.2*  HGB 7.6* 6.7*  HCT 23.1* 20.3*  PLT 236 225   BMET Recent Labs    03/30/19 0610 03/30/19 0610 03/30/19 0708 03/31/19 0541  NA 136   < > 135 133*  K 4.0   < > 4.2 4.4  CL 99   < > 98 96*  CO2 24  --   --  22  GLUCOSE 137*   < > 121* 307*  BUN 44*   < > 41* 56*  CREATININE 8.37*   < > 9.20* 9.17*  CALCIUM 8.4*  --   --  7.8*   < > = values in this interval not displayed.    COAG Lab Results  Component Value Date   INR 0.9 09/17/2018   No results found for: PTT  Assessment/Planning: POD #1 s/p RIJ tunneled dialysis catheter and left brachiocephalic AVF.  Good thrill in fistula and palpable radial pulse.  No hematoma.  Can use catheter immediately for dialysis needs.  Will arrange post-op check in 4-6 weeks with fistula duplex in our clinic.  Fistula will need to mature over next 3 months.  Call vascular with questions or concerns.  Marty Heck 03/31/2019 8:04 AM --

## 2019-03-31 NOTE — Progress Notes (Signed)
[facility] ROUNDING NOTE   Subjective:   Getting first HD rx today.  Feeling OK.  No complaints this AM.  Went over plan of care and rationale for transitioning to HD with pt.    Objective:  Vital signs in last 24 hours:  Temp:  [97.8 F (36.6 C)-98 F (36.7 C)] 98 F (36.7 C) (02/02 1423) Pulse Rate:  [69-77] 70 (02/02 1500) Resp:  [16-20] 18 (02/02 1423) BP: (107-134)/(40-77) 123/58 (02/02 1500) SpO2:  [96 %-99 %] 96 % (02/02 1423) Weight:  [94.8 kg-96.8 kg] 96.4 kg (02/02 1423)  Weight change: 1 kg Filed Weights   03/31/19 0600 03/31/19 0625 03/31/19 1423  Weight: 95.3 kg 94.8 kg 96.4 kg    Intake/Output: I/O last 3 completed shifts: In: 1275 [P.O.:460; I.V.:500; Blood:315] Out: 10 [Blood:10]   Intake/Output this shift:  Total I/O In: 600 [P.O.:600] Out: -   WUX:LKGMWNUUVOZ ill, NAD  HENT NCAT   CV: S1S2 no rub   PULM:Clear bilaterally, normal work of breathing, DGU:YQIH, nontender. ND PD catheter intact  SKIN:No rashes or lesions EXT:No significant edema   Access: RIJ tunneled dialysis catheter; LUE AVF bruit and thrill    Basic Metabolic Panel: Recent Labs  Lab 03/27/19 0526 03/27/19 0526 03/28/19 0657 03/28/19 0657 03/29/19 0706 03/30/19 0610 03/30/19 0708 03/31/19 0541  NA 137   < > 136  --  138 136 135 133*  K 4.6   < > 4.6  --  4.3 4.0 4.2 4.4  CL 102   < > 98  --  101 99 98 96*  CO2 24  --  25  --  26 24  --  22  GLUCOSE 170*   < > 214*  --  139* 137* 121* 307*  BUN 39*   < > 40*  --  44* 44* 41* 56*  CREATININE 9.05*   < > 8.68*  --  8.64* 8.37* 9.20* 9.17*  CALCIUM 8.9   < > 8.7*   < > 8.6* 8.4*  --  7.8*  PHOS 4.7*  --  4.9*  --  5.0* 4.9*  --  4.4   < > = values in this interval not displayed.    Liver Function Tests: Recent Labs  Lab 03/27/19 0526 03/28/19 0657 03/29/19 0706 03/30/19 0610 03/31/19 0541  ALBUMIN 1.9* 1.9* 1.9* 1.8* 1.9*   CBC: Recent Labs  Lab 03/30/19 0610 03/30/19 0708  03/30/19 1348 03/30/19 1829 03/31/19 0541  WBC 9.0  --  12.8* 12.5* 13.2*  HGB 6.2* 5.8* 7.5* 7.6* 6.7*  HCT 19.1* 17.0* 23.2* 23.1* 20.3*  MCV 103.2*  --  103.1* 101.3* 101.5*  PLT 231  --  229 236 225    CBG: Recent Labs  Lab 03/30/19 1150 03/30/19 1647 03/30/19 2116 03/31/19 0604 03/31/19 1149  GLUCAP 170* 217* 331* 295* 8*    Microbiology: Results for orders placed or performed during the hospital encounter of 03/20/19  Culture, Urine     Status: None   Collection Time: 03/22/19  4:15 PM   Specimen: Urine, Catheterized  Result Value Ref Range Status   Specimen Description URINE, CATHETERIZED  Final   Special Requests SITE NOT SPECIFIED  Final   Culture   Final    NO GROWTH Performed at [facility], [place] [address], [place], [place] [address]    Report Status 03/23/2019 FINAL  Final    Imaging: X-ray chest PA or AP  Result Date: 03/30/2019 CLINICAL DATA:  Postop  right dialysis catheter EXAM: CHEST  1 VIEW COMPARISON:  CT chest dated 03/18/2019 FINDINGS: Mild patchy left lower lobe opacity, new, likely atelectasis. Right lung is clear. No pleural effusion or pneumothorax. The heart is top-normal in size. Right IJ dual lumen dialysis catheter terminates in the lower SVC. Old left posterior rib fracture deformity. IMPRESSION: Right IJ dual lumen dialysis catheter terminates in the lower SVC. Mild patchy left lower lobe opacity, likely atelectasis. Electronically Signed   By: Julian Hy M.D.   On: 03/30/2019 09:51   DG Fluoro Guide CV Line-No Report  Result Date: 03/30/2019 Fluoroscopy was utilized by the requesting physician.  No radiographic interpretation.     Medications:   . sodium chloride    . sodium chloride    . dialysis solution 1.5% low-MG/low-CA     . sodium chloride   Intravenous Once  . amLODipine  5 mg Oral Daily  . aspirin EC  81 mg Oral Daily  . atorvastatin  80 mg Oral Daily  . calcium acetate  1,334 mg Oral TID WC  .  Chlorhexidine Gluconate Cloth  6 each Topical Daily  . Chlorhexidine Gluconate Cloth  6 each Topical Q0600  . clopidogrel  75 mg Oral Daily  . darbepoetin (ARANESP) injection - NON-DIALYSIS  25 mcg Subcutaneous Q Sun-1800  . feeding supplement (NEPRO CARB STEADY)  237 mL Oral BID BM  . gabapentin  100 mg Oral BID  . gentamicin cream  1 application Topical Daily  . gentamicin cream  1 application Topical Daily  . insulin aspart  0-15 Units Subcutaneous TID WC  . insulin glargine  30 Units Subcutaneous Daily  . levETIRAcetam  500 mg Oral BID  . levothyroxine  200 mcg Oral q morning - 10a  . lipase/protease/amylase  24,000 Units Oral TID WC  . metoprolol succinate  50 mg Oral Daily  . modafinil  100 mg Oral Daily  . multivitamin  1 tablet Oral Daily   sodium chloride, sodium chloride, acetaminophen, alteplase, bisacodyl, guaiFENesin-dextromethorphan, heparin, lidocaine (PF), lidocaine-prilocaine, pentafluoroprop-tetrafluoroeth, polyethylene glycol, prochlorperazine **OR** prochlorperazine **OR** prochlorperazine, Resource ThickenUp Clear  Assessment/ Plan:  ESRD transitioning from PD to HD--> HD #1 today.  Getting 1 u pRBCs on HD today   Anemia - question acute blood loss as well as CKD - iron saturation 20% - s/p Ferrlecit and darbepoetin 25 mcg weekly given on 03/29/2019. May have been off of ESA with CVA? Will need to inc dose as able.  Note severe anemia on 2/1.  Got 1 unit PRBC's today. Update Hb and may need another unit.  Anticoagulation per primary team - s/p CVA.   History of hypokalemia. Acceptable   Bone mineral continue binders vitamin D.  Continues on PhosLo with meals. vitamin D discontinued due to hypercalcemia at this point.  PTH 91.  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

## 2019-03-31 NOTE — Progress Notes (Signed)
[facility] PHYSICAL MEDICINE [facility] REHABILITATION PROGRESS NOTE   Subjective/Complaints:    ROS: Patient denies nausea, vomiting, diarrhea, cough, shortness of breath or chest pain, joint or back pain, headache, or mood change.    Objective:   X-ray chest PA or AP  Result Date: 03/30/2019 CLINICAL DATA:  Postop right dialysis catheter EXAM: CHEST  1 VIEW COMPARISON:  CT chest dated 03/18/2019 FINDINGS: Mild patchy left lower lobe opacity, new, likely atelectasis. Right lung is clear. No pleural effusion or pneumothorax. The heart is top-normal in size. Right IJ dual lumen dialysis catheter terminates in the lower SVC. Old left posterior rib fracture deformity. IMPRESSION: Right IJ dual lumen dialysis catheter terminates in the lower SVC. Mild patchy left lower lobe opacity, likely atelectasis. Electronically Signed   By: Julian Hy M.D.   On: 03/30/2019 09:51   DG Fluoro Guide CV Line-No Report  Result Date: 03/30/2019 Fluoroscopy was utilized by the requesting physician.  No radiographic interpretation.   Recent Labs    03/30/19 1829 03/31/19 0541  WBC 12.5* 13.2*  HGB 7.6* 6.7*  HCT 23.1* 20.3*  PLT 236 225   Recent Labs    03/30/19 0610 03/30/19 0610 03/30/19 0708 03/31/19 0541  NA 136   < > 135 133*  K 4.0   < > 4.2 4.4  CL 99   < > 98 96*  CO2 24  --   --  22  GLUCOSE 137*   < > 121* 307*  BUN 44*   < > 41* 56*  CREATININE 8.37*   < > 9.20* 9.17*  CALCIUM 8.4*  --   --  7.8*   < > = values in this interval not displayed.    Intake/Output Summary (Last 24 hours) at 03/31/2019 0847 Last data filed at 03/30/2019 2100 Gross per 24 hour  Intake 860 ml  Output 5 ml  Net 855 ml     Physical Exam: Vital Signs Blood pressure 126/62, pulse 69, temperature 97.8 F (36.6 C), temperature source Oral, resp. rate 16, height 6' (1.829 m), weight 94.8 kg, SpO2 99 %. Constitutional: No distress . Vital signs reviewed. HEENT: EOMI, oral membranes moist.Ptosis ~50%  Neck:  supple Cardiovascular: RRR without murmur. No JVD    Respiratory: CTA Bilaterally without wheezes or rales. Normal effort    GI: BS +, non-tender, non-distended   Musculoskeletal:  General: Normal range of motion.  no pain with ROM  Neurological: lethargic. Followed basic commands ie pick up R foot, pick up left foot, did not squeeze to command .c. Left CN III palsy with ptosiscould not do full MMT due to lethargy Skin: Skin iswarm.  Psychiatric: flat    Assessment/Plan: 1. 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  Physiatrist is providing close team supervision and 24 hour management of active medical problems listed below.  Physiatrist and rehab team continue to assess barriers to discharge/monitor patient progress toward functional and medical goals  Care Tool:  Bathing  Bathing activity did not occur: Refused Body parts bathed by patient: Right arm, Left arm, Chest, Abdomen, Front perineal area, Right upper leg, Left upper leg, Face   Body parts bathed by helper: Buttocks, Right lower leg, Left lower leg     Bathing assist Assist Level: Moderate Assistance - Patient 50 - 74%     Upper Body Dressing/Undressing Upper body dressing   What is the patient wearing?:  Pull over shirt    Upper body assist Assist Level: Minimal Assistance - Patient > 75%    Lower Body Dressing/Undressing Lower body dressing      What is the patient wearing?: Incontinence brief, Pants     Lower body assist Assist for lower body dressing: Maximal Assistance - Patient 25 - 49%     Toileting Toileting Toileting Activity did not occur (Clothing management and hygiene only): N/A (no void or bm)  Toileting assist Assist for toileting: Moderate Assistance - Patient 50 - 74%     Transfers Chair/bed transfer  Transfers assist  Chair/bed transfer activity  did not occur: Safety/medical concerns  Chair/bed transfer assist level: Minimal Assistance - Patient > 75%     Locomotion Ambulation   Ambulation assist      Assist level: Moderate Assistance - Patient 50 - 74% Assistive device: Hand held assist Max distance: 50   Walk 10 feet activity   Assist     Assist level: Moderate Assistance - Patient - 50 - 74% Assistive device: No Device, Hand held assist   Walk 50 feet activity   Assist Walk 50 feet with 2 turns activity did not occur: Safety/medical concerns  Assist level: Moderate Assistance - Patient - 50 - 74% Assistive device: No Device, Hand held assist    Walk 150 feet activity   Assist Walk 150 feet activity did not occur: Safety/medical concerns         Walk 10 feet on uneven surface  activity   Assist Walk 10 feet on uneven surfaces activity did not occur: Safety/medical concerns(Per report)         Wheelchair     Assist   Type of Wheelchair: Manual    Wheelchair assist level: Minimal Assistance - Patient > 75% Max wheelchair distance: 155ft    Wheelchair 50 feet with 2 turns activity    Assist        Assist Level: Minimal Assistance - Patient > 75%   Wheelchair 150 feet activity     Assist      Assist Level: Moderate Assistance - Patient 50 - 74%   Blood pressure 126/62, pulse 69, temperature 97.8 F (36.6 C), temperature source Oral, resp. rate 16, height 6' (1.829 m), weight 94.8 kg, SpO2 99 %.  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 Antithrombotics: -DVT/anticoagulation:Pharmaceutical:Heparin -antiplatelet therapy: ASA/PLAVIX X 3 weeks followed by ASA alone. 3. Pain Management:tylenol prn 4. Mood:LCSW to  follow for evaluation and support. -antipsychotic agents: N/A 5. Neuropsych: This patientiscapable of making decisions on hisown behalf. 6. Skin/Wound Care:Routine pressure relief measures. 7. Fluids/Electrolytes/Nutrition:Monitor I/O. Has been refusing nectar liquids. Push PO 8. T2DM with neuropathy/nephropathy: Hgb A1c- Was on ozempic, Lantus, Humalog and glucotrol PTA.            CBG (last 3)  Recent Labs    03/30/19 1647 03/30/19 2116 03/31/19 0604  GLUCAP 217* 331* 295*  1/31: borderline control, continue to monitor.  9. ESRD- on PD at nights.per nephro convert to HD today  due to SNF placement,Has IJ, received PD last noc,    10. Hypokalemia/Hypomagnesemia: supps per nephro. - nephro ordered KCL 60meq TID 11. Anemia of chronic disease: On aranesp weekly. Hgb 6.7 this am  Stool OB positive, had colonoscopy last  year showing diverticular disease , will transfuse another unit today, hold Heparin, sq, will get some heparin with HD  12. Chronic diarrhea:Continue creon tid with meals.   -no diarrhea currently 14.HTN: Monitor BP tid--on metoprolol, losartan and low dose amlodipine             1/24 fair control  1/30: labile  1/31: well controlled.  15. Chronic dizziness/Orthostatic changes?:  no dizziness reported   -will check orthostatic VS x 2 days 16. Prolonged QTC: QTC 528 on EKG 1/14 17. Lethargy: improved       LOS: 11 days A FACE TO Jayton E Clayten Allcock 03/31/2019, 8:47 AM

## 2019-03-31 NOTE — Progress Notes (Signed)
Physical Therapy Session Note  Patient Details  Name: Kyle [name] MRN: [id number] Date of Birth: June 20, 1950  Today's Date: 03/31/2019 PT Individual Time: 0900-1000 PT Individual Time Calculation (min): 60 min   Short Term Goals: Week 2:  PT Short Term Goal 1 (Week 2): Pt will maintain standing balance with min assist up to 5 minutes while engaged in functional task PT Short Term Goal 2 (Week 2): Pt will ambulate 31f with min assist and LRAD PT Short Term Goal 3 (Week 2): [facility] PT Short Term Goal 4 (Week 2): Pt will maintian sitting balance with supervision assist  Skilled Therapeutic Interventions/Progress Updates: Pt presented in bed completing breakfast. MD cleared pt to participated in gentle therapy in room due to low HGb (6.7). pt denies pain however spent first 20 min of session re-orientating pt to what type of surgery pt received yesterday and that he will be changing from PD to HD. Pt asking questions like "why can't I do this from home" explained purpose of rehab to improve strength and safety for home. Pt agreeable to transfer to recliner. Performed supine to sit with CGA however immediately upon sitting pt with lean to R which pt was unable to correct. Pt required modA overall for sitting balance. Pt attempted to don shirt but required modA due to poor R awareness and continuously falling to R. PTA donned pants maxA due to sitting balance. Pt was able to achieve sitting balance with modA with max multimodal cues for hand placement and decreasing R lean. Performed STS with modA and RW with max cues for hand placement and posterior lean noted upon standing (vs lateral). PTA provided total A for LB clothing management. Pt then indicated incontinent BM upon standing. Returned to sitting and performed squat pivot transfer to w/c maxA with max multimodal cues as pt would begin shifting in opposite direction and pushing back. Once in  w/c pt transported to bathroom and performed STS with wall rail. Pt was able to maintain standing while PTA performed peri-care. Pt was able to maintain standing and follow intermittent cues for maintaining R knee extension. Pt returned to w/c and performed squat pivot back to recliner modA. Pt set up in recliner with chair alarm, call bell within reach and needs met.      Therapy Documentation Precautions:  Precautions Precautions: Fall Restrictions Weight Bearing Restrictions: No General:   Vital Signs: Therapy Vitals Temp: 98.1 F (36.7 C) Temp Source: Oral Pulse Rate: 71 Resp: 18 BP: (!) 122/58 Patient Position (if appropriate): Lying Oxygen Therapy SpO2: 98 % O2 Device: Room Air Pain: Pain Assessment Pain Scale: 0-10 Pain Score: 0-No pain    Therapy/Group: Individual Therapy  Gianny Killman  Addie Cederberg, PTA  03/31/2019, 3:27 PM

## 2019-03-31 NOTE — Progress Notes (Signed)
Pt Hemoglobin- 7.6 and Positive fecal occult blood. This nurse held heparin this morning.

## 2019-03-31 NOTE — Progress Notes (Signed)
OT Cancellation Note   OT attempting to see pt for scheduled therapy time but he is off the floor for dialysis at this time. OT will follow up when able.   Darleen Crocker MS, OTR/L

## 2019-03-31 NOTE — Progress Notes (Signed)
Speech Language Pathology Daily Session Note  Patient Details  Name: Axavier Pressley MRN: [id number] Date of Birth: [birth year]  Today's Date: 03/31/2019 SLP Individual Time: 3382-5053 SLP Individual Time Calculation (min): 45 min  Short Term Goals: Week 2: SLP Short Term Goal 1 (Week 2): Pt will consume current diet with minimal overt s/s of aspiration/dysphagia and Min A cues for use of safe swallow strategies. SLP Short Term Goal 2 (Week 2): Pt will demonstrate use of speech intelligibility strategies (over articulation and slow rate) at sentence level for 90% intelligibility with min A verbal cues. SLP Short Term Goal 3 (Week 2): Pt will demonstrate sustained attention in 15 minute intervals with min A verbal cues for redirection during functional tasks. SLP Short Term Goal 4 (Week 2): Pt will answer yes/no quesitons to demonstrate intellectual awareness of cognition/communication/swallow deficits and physical deficits in 5 out of 10 opportunities with Min A cues. SLP Short Term Goal 5 (Week 2): Pt will complete functional and basic problem solving tasks with min A verbal cues. SLP Short Term Goal 6 (Week 2): Pt will utilize external compensatory memory strategies to recall information in 5 out of 10 opportunities with Min A cues.  Skilled Therapeutic Interventions: Pt was seen for skilled ST targeting cognitive goals. In functional conversation, pt initially expressed he had no difficulty swallowing, however when pressed with Mod A question cues, he relayed details of 2 instrumental swallow tests, upgrading to thin liquids, and need to perform extra dry swallow with every bite/sip. However, Total A required for grasping rationale behind swallow recommendations, and particularly reasoning for continued use of extra dry swallow. Pt expressed difficulty reading printed therapy schedule due to visual impairments, therefore SLP re-wrote it with extra large black print. Pt verbally acknowledged he  could read this schedule and feels better when he knows what to expect for the day. However, decreased mental flexibility noted, as pt was somewhat fixated on request to have schedule automatically printed in large text as opposed to handwritten, therefore Max A verbal cues for reasoning and functionality of handwritten schedule providing same function as printed schedule required. Pt demonstrated decreased recall of new information as well as awareness of cognitive deficits (able to identify 2 physical deficits), requiring Max A verbal cues for intellectual awareness and recall for d/c plan of SNF. Pt expressed appropriate interest in learning more about his stroke location and prognosis, which has already been reviewed with him by medical team, however written aid with his questions generated to ask MD again at next available date with Mod A verbal cues to assist and Total physical assistance with handwriting by SLP. Pt left sitting in recliner with seat alarm in place and needs within reach. Continue per current plan of care.       Pain Pain Assessment Pain Scale: 0-10 Pain Score: 0-No pain  Therapy/Group: Individual Therapy  Arbutus Leas 03/31/2019, 11:43 AM

## 2019-04-01 ENCOUNTER — Inpatient Hospital Stay (HOSPITAL_COMMUNITY): Payer: Medicare PPO | Admitting: Speech Pathology

## 2019-04-01 ENCOUNTER — Inpatient Hospital Stay (HOSPITAL_COMMUNITY): Payer: Medicare PPO | Admitting: Occupational Therapy

## 2019-04-01 ENCOUNTER — Inpatient Hospital Stay (HOSPITAL_COMMUNITY): Payer: Medicare PPO | Admitting: Physical Therapy

## 2019-04-01 LAB — RENAL FUNCTION PANEL
Albumin: 1.8 g/dL — ABNORMAL LOW (ref 3.5–5.0)
Anion gap: 14 (ref 5–15)
BUN: 34 mg/dL — ABNORMAL HIGH (ref 8–23)
CO2: 25 mmol/L (ref 22–32)
Calcium: 7.4 mg/dL — ABNORMAL LOW (ref 8.9–10.3)
Chloride: 97 mmol/L — ABNORMAL LOW (ref 98–111)
Creatinine, Ser: 6.46 mg/dL — ABNORMAL HIGH (ref 0.61–1.24)
GFR calc Af Amer: 9 mL/min — ABNORMAL LOW (ref >60)
GFR calc non Af Amer: 8 mL/min — ABNORMAL LOW (ref >60)
Glucose, Bld: 140 mg/dL — ABNORMAL HIGH (ref 70–99)
Phosphorus: 3.4 mg/dL (ref 2.5–4.6)
Potassium: 3.9 mmol/L (ref 3.5–5.1)
Sodium: 136 mmol/L (ref 135–145)

## 2019-04-01 LAB — GLUCOSE, CAPILLARY
Glucose-Capillary: 141 mg/dL — ABNORMAL HIGH (ref 70–99)
Glucose-Capillary: 292 mg/dL — ABNORMAL HIGH (ref 70–99)
Glucose-Capillary: 292 mg/dL — ABNORMAL HIGH (ref 70–99)
Glucose-Capillary: 283 mg/dL — ABNORMAL HIGH (ref 70–99)

## 2019-04-01 LAB — CBC
HCT: 21.6 % — ABNORMAL LOW (ref 39.0–52.0)
Hemoglobin: 7.1 g/dL — ABNORMAL LOW (ref 13.0–17.0)
MCH: 33.3 pg (ref 26.0–34.0)
MCHC: 32.9 g/dL (ref 30.0–36.0)
MCV: 101.4 fL — ABNORMAL HIGH (ref 80.0–100.0)
Platelets: 195 10*3/uL (ref 150–400)
RBC: 2.13 MIL/uL — ABNORMAL LOW (ref 4.22–5.81)
RDW: 16.6 % — ABNORMAL HIGH (ref 11.5–15.5)
WBC: 8.7 10*3/uL (ref 4.0–10.5)
nRBC: 0 % (ref 0.0–0.2)

## 2019-04-01 LAB — VITAMIN B12: Vitamin B-12: 322 pg/mL (ref 180–914)

## 2019-04-01 NOTE — Progress Notes (Signed)
[facility] ROUNDING NOTE   Subjective:   Getting first HD rx today.  Feeling OK.  No complaints this AM.  Went over plan of care and rationale for transitioning to HD with pt.    Objective:  Vital signs in last 24 hours:  Temp:  [97.9 F (36.6 C)-98.6 F (37 C)] 97.9 F (36.6 C) (02/03 1534) Pulse Rate:  [66-87] 72 (02/03 1534) Resp:  [18-19] 18 (02/03 1534) BP: (118-143)/(53-73) 140/59 (02/03 1534) SpO2:  [95 %-98 %] 98 % (02/03 1534) Weight:  [95.5 kg] 95.5 kg (02/02 1706)  Weight change: -0.4 kg Filed Weights   03/31/19 0625 03/31/19 1423 03/31/19 1706  Weight: 94.8 kg 96.4 kg 95.5 kg    Intake/Output: I/O last 3 completed shifts: In: 28 [P.O.:720; Blood:315] Out: 500 [Other:500]   Intake/Output this shift:  Total I/O In: 360 [P.O.:360] Out: -   BTD:VVOHYWVPXTG ill, NAD  HENT NCAT   CV: S1S2 no rub   PULM:Clear bilaterally, normal work of breathing, GYI:RSWN, nontender. ND PD catheter intact  SKIN:No rashes or lesions EXT:No significant edema   Access: RIJ tunneled dialysis catheter; LUE AVF bruit and thrill    Basic Metabolic Panel: Recent Labs  Lab 03/28/19 0657 03/28/19 0657 03/29/19 0706 03/29/19 0706 03/30/19 0610 03/30/19 0708 03/31/19 0541 04/01/19 0708  NA 136   < > 138  --  136 135 133* 136  K 4.6   < > 4.3  --  4.0 4.2 4.4 3.9  CL 98   < > 101  --  99 98 96* 97*  CO2 25  --  26  --  24  --  22 25  GLUCOSE 214*   < > 139*  --  137* 121* 307* 140*  BUN 40*   < > 44*  --  44* 41* 56* 34*  CREATININE 8.68*   < > 8.64*  --  8.37* 9.20* 9.17* 6.46*  CALCIUM 8.7*   < > 8.6*   < > 8.4*  --  7.8* 7.4*  PHOS 4.9*  --  5.0*  --  4.9*  --  4.4 3.4   < > = values in this interval not displayed.    Liver Function Tests: Recent Labs  Lab 03/28/19 0657 03/29/19 0706 03/30/19 0610 03/31/19 0541 04/01/19 0708  ALBUMIN 1.9* 1.9* 1.8* 1.9* 1.8*   CBC: Recent Labs  Lab 03/30/19 0610 03/30/19 0610 03/30/19 0708  03/30/19 1348 03/30/19 1829 03/31/19 0541 04/01/19 0708  WBC 9.0  --   --  12.8* 12.5* 13.2* 8.7  HGB 6.2*   < > 5.8* 7.5* 7.6* 6.7* 7.1*  HCT 19.1*   < > 17.0* 23.2* 23.1* 20.3* 21.6*  MCV 103.2*  --   --  103.1* 101.3* 101.5* 101.4*  PLT 231  --   --  229 236 225 195   < > = values in this interval not displayed.    CBG: Recent Labs  Lab 03/31/19 1149 03/31/19 1733 03/31/19 2102 04/01/19 0558 04/01/19 1131  GLUCAP 312* 145* 253* 141* 81*    Microbiology: Results for orders placed or performed during the hospital encounter of 03/20/19  Culture, Urine     Status: None   Collection Time: 03/22/19  4:15 PM   Specimen: Urine, Catheterized  Result Value Ref Range Status   Specimen Description URINE, CATHETERIZED  Final   Special Requests SITE NOT SPECIFIED  Final   Culture   Final    NO GROWTH Performed at [facility]  [facility]ospital Lab, [place] [address], [place], [place] [address]    Report Status 03/23/2019 FINAL  Final    Imaging: No results found.   Medications:   . dialysis solution 1.5% low-MG/low-CA     . sodium chloride   Intravenous Once  . amLODipine  5 mg Oral Daily  . aspirin EC  81 mg Oral Daily  . atorvastatin  80 mg Oral Daily  . calcium acetate  1,334 mg Oral TID WC  . Chlorhexidine Gluconate Cloth  6 each Topical Daily  . Chlorhexidine Gluconate Cloth  6 each Topical Q0600  . clopidogrel  75 mg Oral Daily  . darbepoetin (ARANESP) injection - NON-DIALYSIS  25 mcg Subcutaneous Q Sun-1800  . feeding supplement (NEPRO CARB STEADY)  237 mL Oral BID BM  . gabapentin  100 mg Oral BID  . gentamicin cream  1 application Topical Daily  . gentamicin cream  1 application Topical Daily  . insulin aspart  0-15 Units Subcutaneous TID WC  . insulin glargine  30 Units Subcutaneous Daily  . levETIRAcetam  500 mg Oral BID  . levothyroxine  200 mcg Oral q morning - 10a  . lipase/protease/amylase  24,000 Units Oral TID WC  . metoprolol succinate  50 mg Oral Daily  .  modafinil  100 mg Oral Daily  . multivitamin  1 tablet Oral Daily   acetaminophen, bisacodyl, guaiFENesin-dextromethorphan, polyethylene glycol, prochlorperazine **OR** prochlorperazine **OR** prochlorperazine, Resource ThickenUp Clear  Assessment/ Plan:  ESRD transitioning from PD to HD--> HD #1 2/2, next HD planned 2/4.  S/p 1u pRBCs in HD yesterday   Anemia - question acute blood loss as well as CKD - iron saturation 20% - s/p Ferrlecit and darbepoetin 25 mcg weekly given on 03/29/2019. May have been off of ESA with CVA? Will need to inc dose as able.  Note severe anemia on 2/1.  May need GI consult   History of hypokalemia. Acceptable   Bone mineral continue binders vitamin D.  Continues on PhosLo with meals. vitamin D discontinued due to hypercalcemia at this point.  PTH 91.  Chronic pancreatitis continues on pancreatic enzyme supplementation  Status post pontine stroke continues on Plavix and aspirin  Hypertension/volume controlled   Diabetes mellitus as per primary team  Hypothyroidism on replacement therapy   Madelon Lips, MD 04/01/2019  4:10 PM

## 2019-04-01 NOTE — Progress Notes (Signed)
Speech Language Pathology Daily Session Note  Patient Details  Name: Kyle [name] MRN: [id number] Date of Birth: 06-07-1950  Today's Date: 04/01/2019 SLP Individual Time: 1400-1500 SLP Individual Time Calculation (min): 60 min  Short Term Goals: Week 2: SLP Short Term Goal 1 (Week 2): Pt will consume current diet with minimal overt s/s of aspiration/dysphagia and Min A cues for use of safe swallow strategies. SLP Short Term Goal 2 (Week 2): Pt will demonstrate use of speech intelligibility strategies (over articulation and slow rate) at sentence level for 90% intelligibility with min A verbal cues. SLP Short Term Goal 3 (Week 2): Pt will demonstrate sustained attention in 15 minute intervals with min A verbal cues for redirection during functional tasks. SLP Short Term Goal 4 (Week 2): Pt will answer yes/no quesitons to demonstrate intellectual awareness of cognition/communication/swallow deficits and physical deficits in 5 out of 10 opportunities with Min A cues. SLP Short Term Goal 5 (Week 2): Pt will complete functional and basic problem solving tasks with min A verbal cues. SLP Short Term Goal 6 (Week 2): Pt will utilize external compensatory memory strategies to recall information in 5 out of 10 opportunities with Min A cues.  Skilled Therapeutic Interventions:  Skilled treatment session targeted cognition goals. Skilled treatment session targeted cognition and dysphagia goals. SLP facilitated session by administering the [facility] Blind. Pt obtained score of 17 out of 22 (n=>18). During [facility] pt presents with mild deficits many in memory. However during functional tasks, pt with signficant memory deficits that impacted problem solving and recall of medical issues. For example, pt doesn't recall what fistula in forearm in for.    While pt is free of overt s/s of aspiration at bedside, silent aspiration cannot be assessed at bedside. Recommendation from [facility] and previous ST notes state that pt  needs dry swallow following bolus/sips of POs. However pt is not able to remember or use strategy d/t cognitive deficits. Therefore instrumental study recommended to assess if strategy is required and to assess airway protection. Scheduled for 04/02/19.      Pain    Therapy/Group: Individual Therapy  Novalyn Lajara 04/01/2019, 3:10 PM

## 2019-04-01 NOTE — Plan of Care (Signed)
  Problem: Consults Goal: RH STROKE PATIENT EDUCATION Description: See Patient Education module for education specifics  Outcome: Progressing Goal: Nutrition Consult-if indicated Outcome: Progressing Goal: Diabetes Guidelines if Diabetic/Glucose > 140 Description: If diabetic or lab glucose is > 140 mg/dl - Initiate Diabetes/Hyperglycemia Guidelines & Document Interventions  Outcome: Progressing   Problem: RH BOWEL ELIMINATION Goal: RH STG MANAGE BOWEL WITH ASSISTANCE Description: STG Manage Bowel with mod Assistance. Outcome: Progressing Goal: RH STG MANAGE BOWEL W/MEDICATION W/ASSISTANCE Description: STG Manage Bowel with Medication with mod Assistance. Outcome: Progressing   Problem: RH SKIN INTEGRITY Goal: RH STG SKIN FREE OF INFECTION/BREAKDOWN Description: Skin to remain free from infection and breakdown while on rehab with min assist. Outcome: Progressing Goal: RH STG MAINTAIN SKIN INTEGRITY WITH ASSISTANCE Description: STG Maintain Skin Integrity With min Assistance. Outcome: Progressing Goal: RH STG ABLE TO PERFORM INCISION/WOUND CARE W/ASSISTANCE Description: STG Able To Perform Wound Care With mod Assistance. Outcome: Progressing   Problem: RH SAFETY Goal: RH STG ADHERE TO SAFETY PRECAUTIONS W/ASSISTANCE/DEVICE Description: STG Adhere to Safety Precautions With mod Assistance and appropriate assistive Device. Outcome: Progressing   Problem: RH PAIN MANAGEMENT Goal: RH STG PAIN MANAGED AT OR BELOW PT'S PAIN GOAL Description: <3 on a 0-10 pain scale. Outcome: Progressing   Problem: RH KNOWLEDGE DEFICIT Goal: RH STG INCREASE KNOWLEDGE OF DIABETES Description: Patient will increase knowledge on DM medications, dietary restrictions, and follow-up care with the MD after discharge with min assist from staff. Outcome: Progressing Goal: RH STG INCREASE KNOWLEDGE OF HYPERTENSION Description: Patient will increase knowledge of HTN medications, dietary restrictions, and  follow-up care with the MD after discharge with min assist from staff. Outcome: Progressing Goal: RH STG INCREASE KNOWLEDGE OF DYSPHAGIA/FLUID INTAKE Description: Patient will increase and display knowledge of dysphagia diets and thickened liquids appropriate for his dietary restrictions with min assist from staff. Outcome: Progressing Goal: RH STG INCREASE KNOWLEGDE OF HYPERLIPIDEMIA Description: Patient will increase knowledge of HLD medications and follow-up care with the MD after discharge with min assist from staff. Outcome: Progressing Goal: RH STG INCREASE KNOWLEDGE OF STROKE PROPHYLAXIS Description: Patient will increase knowledge of medications used to prevent future strokes and follow-up care with the MD after discharge with min assist from staff. Outcome: Progressing

## 2019-04-01 NOTE — Progress Notes (Signed)
Team Conference Report to Patient/Family  Team Conference discussion was reviewed with the patient and caregiver, including goals, any changes in plan of care and target discharge date pending bed offer from SNF.  Patient and caregiver express understanding and are in agreement. (Plans are for SNF placement when bed is available). Goals downgraded from supervision to minimal assistance and moderate assist for cognition. Still having trouble with memory, leaning and LOB with transfers/ambulation. He was able to ambulate 25' today. Wife appreciative of the update.  Kyle [name] B 04/01/2019, 3:41 PM

## 2019-04-01 NOTE — Significant Event (Signed)
Patient refused CHG bath. Patient informed of purpose of CHG bath related to infection prevention, as well as the risks of refusal. Patient still refused.

## 2019-04-01 NOTE — Progress Notes (Addendum)
[facility] PHYSICAL MEDICINE & REHABILITATION PROGRESS NOTE   Subjective/Complaints:  Did better with PT this am  ROS: Patient denies nausea, vomiting, diarrhea, cough, shortness of breath or chest pain, joint or back pain, headache, or mood change.    Objective:   No results found. Recent Labs    03/31/19 0541 04/01/19 0708  WBC 13.2* 8.7  HGB 6.7* 7.1*  HCT 20.3* 21.6*  PLT 225 195   Recent Labs    03/31/19 0541 04/01/19 0708  NA 133* 136  K 4.4 3.9  CL 96* 97*  CO2 22 25  GLUCOSE 307* 140*  BUN 56* 34*  CREATININE 9.17* 6.46*  CALCIUM 7.8* 7.4*    Intake/Output Summary (Last 24 hours) at 04/01/2019 1018 Last data filed at 04/01/2019 0800 Gross per 24 hour  Intake 915 ml  Output 500 ml  Net 415 ml     Physical Exam: Vital Signs Blood pressure (!) 130/53, pulse 76, temperature 98.6 F (37 C), resp. rate 18, height 6' (1.829 m), weight 95.5 kg, SpO2 95 %. Constitutional: No distress . Vital signs reviewed. HEENT: EOMI, oral membranes moist.Ptosis ~50%  Neck: supple Cardiovascular: RRR without murmur. No JVD    Respiratory: CTA Bilaterally without wheezes or rales. Normal effort    GI: BS +, non-tender, non-distended   Musculoskeletal:  General: Normal range of motion.  no pain with ROM  Neurological: lethargic. Followed basic commands ie pick up R foot, pick up left foot, did not squeeze to command .c. Left CN III palsy with ptosiscould not do full MMT due to lethargy Skin: Skin iswarm.  Psychiatric: flat    Assessment/Plan: 1. 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  Physiatrist is providing close team supervision and 24 hour management of active medical problems listed below.  Physiatrist and rehab team continue to assess barriers to discharge/monitor patient progress toward functional and medical goals  Care  Tool:  Bathing  Bathing activity did not occur: Refused Body parts bathed by patient: Right arm, Left arm, Chest, Abdomen, Front perineal area, Right upper leg, Left upper leg, Face   Body parts bathed by helper: Buttocks, Right lower leg, Left lower leg     Bathing assist Assist Level: Moderate Assistance - Patient 50 - 74%     Upper Body Dressing/Undressing Upper body dressing   What is the patient wearing?: Pull over shirt    Upper body assist Assist Level: Minimal Assistance - Patient > 75%    Lower Body Dressing/Undressing Lower body dressing      What is the patient wearing?: Incontinence brief, Pants     Lower body assist Assist for lower body dressing: Maximal Assistance - Patient 25 - 49%     Toileting Toileting Toileting Activity did not occur (Clothing management and hygiene only): N/A (no void or bm)  Toileting assist Assist for toileting: Moderate Assistance - Patient 50 - 74%     Transfers Chair/bed transfer  Transfers assist  Chair/bed transfer activity did not occur: Safety/medical concerns  Chair/bed transfer assist level: Minimal Assistance - Patient > 75%     Locomotion Ambulation   Ambulation assist      Assist level: Moderate Assistance - Patient 50 - 74% Assistive device: Walker-rolling Max distance: 25   Walk 10 feet activity   Assist     Assist level: Moderate Assistance - Patient - 50 - 74% Assistive device: Walker-rolling  Walk 50 feet activity   Assist Walk 50 feet with 2 turns activity did not occur: Safety/medical concerns  Assist level: Moderate Assistance - Patient - 50 - 74% Assistive device: No Device, Hand held assist    Walk 150 feet activity   Assist Walk 150 feet activity did not occur: Safety/medical concerns         Walk 10 feet on uneven surface  activity   Assist Walk 10 feet on uneven surfaces activity did not occur: Safety/medical concerns(Per report)          Wheelchair     Assist   Type of Wheelchair: Manual    Wheelchair assist level: Minimal Assistance - Patient > 75% Max wheelchair distance: 153f    Wheelchair 50 feet with 2 turns activity    Assist        Assist Level: Minimal Assistance - Patient > 75%   Wheelchair 150 feet activity     Assist      Assist Level: Moderate Assistance - Patient 50 - 74%   Blood pressure (!) 130/53, pulse 76, temperature 98.6 F (37 C), resp. rate 18, height 6' (1.829 m), weight 95.5 kg, SpO2 95 %.  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 Formulized individual treatment plan based on medical history, underlying problem and comorbidities.  --Continue CIR therapies including PT, OT, and SLP  2. Antithrombotics: -DVT/anticoagulation:Pharmaceutical:Heparin -antiplatelet therapy: ASA/PLAVIX X 3 weeks followed by ASA alone. 3. Pain Management:tylenol prn 4. Mood:LCSW to follow for evaluation and support. -antipsychotic agents: N/A 5. Neuropsych: This patientiscapable of making decisions on hisown behalf. 6. Skin/Wound Care:Routine pressure relief measures. 7. Fluids/Electrolytes/Nutrition:Monitor I/O. Has been refusing nectar liquids. Push PO 8. T2DM with neuropathy/nephropathy: Hgb A1c- Was on ozempic, Lantus, Humalog and glucotrol PTA.            CBG (last 3)  Recent Labs    03/31/19 1733 03/31/19 2102 04/01/19 0558  GLUCAP 145* 253* 141*  fair control 2/3  9. ESRD- on PD at nights.per nephro convert to HD due to SNF placement,Has IJ, received PD last noc,    10. Hypokalemia/Hypomagnesemia: supps per nephro. - nephro ordered KCL 472m TID 11. Anemia of chronic  disease: On aranesp weekly. 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 Chronic diarrhea:Continue creon tid with meals.   -no diarrhea currently 14.HTN: Monitor BP tid--on metoprolol, losartan and low dose amlodipine             1/24 fair control  1/30: labile  1/31: well controlled.  15. Chronic dizziness/Orthostatic changes?:  no dizziness reported   -will check orthostatic VS x 2 days 16. Prolonged QTC: QTC 528 on EKG 1/14 17. Lethargy: improved , post op 1830 Dysphagia - stroke no sign of aspiration     LOS: 12 days A FACE TO FACE EVALUATION WAS PERFORMED  AnCharlett Blake/04/2019, 10:18 AM

## 2019-04-01 NOTE — Plan of Care (Signed)
Goals downgraded for safety and decrease in progress to min A

## 2019-04-01 NOTE — Progress Notes (Signed)
Physical Therapy Session Note  Patient Details  Name: Kyle [name] MRN: [id number] Date of Birth: August 09, 1950  Today's Date: 04/01/2019 PT Individual Time: 0900-0957  PT Individual Time Calculation (min): 57 min   Short Term Goals: Week 2:  PT Short Term Goal 1 (Week 2): Pt will maintain standing balance with min assist up to 5 minutes while engaged in functional task PT Short Term Goal 2 (Week 2): Pt will ambulate 4f with min assist and LRAD PT Short Term Goal 3 (Week 2): [facility] and [facility] consistently PT Short Term Goal 4 (Week 2): Pt will maintian sitting balance with supervision assist  Skilled Therapeutic Interventions/Progress Updates:   Pt received supine in bed and agreeable to PT. Supine>sit transfer with min assist and cues to push through RUE and and to reach lateral L with LUE to prevent RLOB.    PT performed Orthostatic BP. Supine 143/60. Sitting 128/62. Standing 131/57. No significant change in orthostatic hypotension s/s with change in position.   Stand [facility] and [facility]. Transported to day room. Standing balance while performing cross body reaches 2 x 6 BUE with min assist to sustain balance and prevent R LOB.    Gait training with RW 3 x 25. Min assist on fist 2 bouts and mod assist on the 3rd due to increased R lateral lean. Occasional assist to increased step width.   [facility]<>[facility] and min assist overall for anterior weight shifting and cues to maintain wide BOS. Pt pushing from RW throughout, regardless of cues.   Stand pivot transfer back to bed with RW and min assist. Sit>supine with supervision assist. Pt left in bed with call bell in reach all needs met.       Therapy Documentation Precautions:  Precautions Precautions: Fall Restrictions Weight Bearing Restrictions: No  Vital Signs: Therapy Vitals Pulse Rate: 76 BP: (!) 130/53 Pain: Pain Assessment Pain Scale: 0-10 Pain  Score: 0-No pain    Therapy/Group: Individual Therapy  ALorie Phenix2/04/2019, 10:01 AM

## 2019-04-01 NOTE — Plan of Care (Signed)
PT LTGs Downgraded due to lack of progress 2/2 due continued cognitive deficits and poor awareness of deficits and midline orientation.     Barrie Folk PT, DPT   04/01/19 10:17 AM

## 2019-04-01 NOTE — Progress Notes (Signed)
Occupational Therapy Session Note  Patient Details  Name: Kyle [name] MRN: [id number] Date of Birth: 1950-05-25  Today's Date: 04/01/2019 OT Individual Time: 6283-6629 OT Individual Time Calculation (min): 56 min    Short Term Goals: Week 2:  OT Short Term Goal 1 (Week 2): STGs=LTGs due to ELOS  Skilled Therapeutic Interventions/Progress Updates:    Upon entering the room, pt supine in bed and utilizing urinal independently. Pt agreeable to OT intervention but declines further toileting. Supine >sit with min A for truncal support to EOB. Pt transferred into wheelchair with min A stand pivot transfer with use of RW. Pt seated at sink for grooming tasks with supervision level. Pt bathing and dressing UB with set up A. Pt utilized figure four position to wash B LEs and needing min - mod A for standing balance to pull brief over B hips. Pt refused to sit up in wheelchair secondary to fatigue and transferred back to bed with min A stand pivot transfer and min cuing for technique and hand placement. Sit >supine with supervision. Call bell and all needed items within reach. Bed alarm activated.   Therapy Documentation Precautions:  Precautions Precautions: Fall Restrictions Weight Bearing Restrictions: No General:   Vital Signs: Therapy Vitals Pulse Rate: 76 BP: (!) 130/53 Pain: Pain Assessment Pain Scale: 0-10 Pain Score: 0-No pain ADL: ADL Eating: Not assessed Grooming: Maximal assistance Where Assessed-Grooming: Edge of bed Upper Body Bathing: Minimal assistance Where Assessed-Upper Body Bathing: Edge of bed Lower Body Bathing: Maximal assistance Where Assessed-Lower Body Bathing: Edge of bed Upper Body Dressing: Moderate assistance Where Assessed-Upper Body Dressing: Edge of bed Lower Body Dressing: Maximal assistance Where Assessed-Lower Body Dressing: Edge of bed Toileting: Not assessed Toilet Transfer: Not assessed Tub/Shower Transfer: Not assessed Vision    Perception    Praxis   Exercises:   Other Treatments:     Therapy/Group: Individual Therapy  Gypsy Decant 04/01/2019, 11:30 AM

## 2019-04-01 NOTE — Progress Notes (Signed)
Renal Navigator is following along to refer to OP HD once SNF bed has been secured.  Alphonzo Cruise, [place] Renal Navigator [phone]

## 2019-04-01 NOTE — Patient Care Conference (Signed)
Inpatient RehabilitationTeam Conference and Plan of Care Update Date: 04/01/2019   Time: 10:10 AM    Patient Name: Kyle [name]      Medical Record Number: [id number]  Date of Birth: 11/20/1950 Sex: Male         Room/Bed: 4W20C/4W20C-01 Payor Info: Payor: [employer] MEDICARE / Plan: [employer] MEDICARE CHOICE PPO / Product Type: *No Product type* /    Admit Date/Time:  03/20/2019  3:08 PM  Primary Diagnosis:  Left pontine stroke [facility])  Patient Active Problem List   Diagnosis Date Noted  . 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[place]) 03/20/2019  . Generalized weakness   . Diabetic polyneuropathy associated with type 2 diabetes mellitus ([place])   . Cerebrovascular accident (CVA) ([place])   . PVD (peripheral vascular disease) ([place]) 03/17/2019  . Ptosis of eyelid, left 03/15/2019  . Physical deconditioning 03/14/2019  . Hypomagnesemia 03/13/2019  . Hyperphosphatemia 03/13/2019  . ESRD (end stage renal disease) ([place]) 03/13/2019  . 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 Prolonged QT interval 03/13/2019  . Essential hypertension 03/13/2019  . Hypothyroidism 03/13/2019  . Chronic diarrhea 03/13/2019  . Hypocalcemia 03/12/2019  . Hypokalemia 09/16/2018  . Lung nodule 06/12/2016  . Narrowing of airway   . Cigarette smoker 05/24/2016  . Collapse of right lung 05/24/2016  . COPD with chronic bronchitis ([place]) 05/24/2016  . Acne 05/20/2015  . Calculus of kidney 05/20/2015  . Chest pain, non-cardiac 05/20/2015  . Seizure ([place]) 05/20/2015  . Current tobacco use 05/20/2015  . Chronic kidney disease (CKD), stage III (moderate) 03/29/2015  . Type 2 diabetes mellitus with hyperlipidemia ([place]) 03/15/2015  . Lipoma of shoulder 03/08/2014  . Other synovitis and tenosynovitis, right shoulder 03/08/2014  . Bursitis of elbow 02/15/2014  . Absolute anemia 08/08/2013  . 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 Back pain, chronic 08/08/2013  . Chronic obstructive  pulmonary disease ([place]) 08/08/2013  . Diabetes mellitus ([place]) 08/08/2013  . BP (high blood pressure) 08/08/2013  . HLD (hyperlipidemia) 08/08/2013  . Acne erythematosa 08/08/2013    Expected Discharge Date: Expected Discharge Date: (Plans are for SNF placement when bed is available)  Team Members Present: Physician leading conference: Dr. Alysia Penna Social Worker Present: Lennart Pall, LCSW Nurse Present: Dorien Chihuahua, RN;Other (comment)(Inka Muddy, LPN) Case Manager: Karene Fry, RN PT Present: Barrie Folk, PT OT Present: Darleen Crocker, OT SLP Present: Jettie Booze, CF-SLP PPS Coordinator present : Ileana Ladd, Burna Mortimer, SLP     Current Status/Progress Goal Weekly Team Focus  Bowel/Bladder   hemodialysis patient (transitioning from PD to HD); continent with some episodes of incontinence; LBM 2/2  maintain regular bowel pattern; reduce incontinence  assis with toileting needs prn   Swallow/Nutrition/ Hydration   Min-Supervision A Dys 3/thin  Min-Supervision A  carryover swallow strategies, regular trials   ADL's   mod - total A overall with max A transfer  goals downgraded to min A overall  ADL retraining, NMR, functional transfers, postural control, safety awareness, sitting/standing balance   Mobility   min/modA bed mobility, modA STS with heavy R posteriorlateral lean, modA squat pivot, decreased righting reactions in sitting, poor sitting balance with R lean, mod/maxA gait  CGA, may need to be downgraded  sitting balance, midline orientation, standing balance, gait   Communication   80-90% intelligible sentence level depending on fatigue, Min-Mod A  Mod A (downgraded)  carryover intelligibility strategies   Safety/Cognition/ Behavioral Observations  Mod A basic problem solving, sustained  attention, awareness  Mod A (downgraded)  intellectual and emergent awareness, basic problem solving, sustained attention, recall with strategies   Pain   no c/o pain   remain pain free  assess pain qs/prn   Skin   MASD buttocks, PD cath LLQ, right IJ, LUE AV fistula w skin glue  remain free of new skin infection/breakdown  assess skin qs/prn    Rehab Goals Patient on target to meet rehab goals: Yes *See Care Plan and progress notes for long and short-term goals.     Barriers to Discharge  Current Status/Progress Possible Resolutions Date Resolved   Nursing                  PT                    OT                  SLP                SW Inaccessible home environment;Decreased caregiver support;Lack of/limited family support;Medication compliance;Hemodialysis Changed over to HD via IJ, AVF placed for long term use Awaiting medical clearance to pursue SNF; pt and wife in agreement          Discharge Planning/Teaching Needs:  Home with wife who can provide minimal assistance changed to SNF for short term  TBD; wife will need refresher education on PD   Team Discussion: Converted to HD, AV shunt placed, transfused after shunt, cognitive deficits, had HD yesterday.  RN BS 141, had 1 unit blood in HD yesterday.  OT mod to tot A self care, downgrade goals to min A.  PT max/tot, today min/mod, min A bed, min mod transfers and gait 25', better today, downgrade goals to min A.  SLP D3thins, cannot remember strategies, repeat MBS tom, recall poor, awareness poor, cog goals mod A.  Wife to place in SNF/awaiting bed for SNF.   Revisions to Treatment Plan: N/A     Medical Summary Current Status: Converting to HD, anemia requiring transfusion, multifactorial , heme + stool Weekly Focus/Goal: workup of anemia,reduce heparin  Barriers to Discharge: Hemodialysis  Barriers to Discharge Comments: new hemodialysis Possible Resolutions to Barriers: nephrology consult, may need GI consult   Continued Need for Acute Rehabilitation Level of Care: The patient requires daily medical management by a physician with specialized training in physical medicine and  rehabilitation for the following reasons: Direction of a multidisciplinary physical rehabilitation program to maximize functional independence : Yes Medical management of patient stability for increased activity during participation in an intensive rehabilitation regime.: Yes Analysis of laboratory values and/or radiology reports with any subsequent need for medication adjustment and/or medical intervention. : Yes   I attest that I was present, lead the team conference, and concur with the assessment and plan of the team.   Retta Diones 04/01/2019, 3:36 PM   Team conference was held via web/ teleconference due to [place] - 19

## 2019-04-01 NOTE — Progress Notes (Signed)
Patient ID: Kyle [name], male   DOB: 1950/04/20, 69 y.o.   MRN: [id number] Spoke with patient regarding request from patient's brother for information about his hospitalization. Per the MD; the patient is capable of making decisions on his own behalf and he noted the hospital can tell his brother anything he needs to know about the patient. [name] noted his brother [name] has his permission to receive health information and speak on his behalf.

## 2019-04-02 ENCOUNTER — Inpatient Hospital Stay (HOSPITAL_COMMUNITY): Payer: Medicare PPO

## 2019-04-02 ENCOUNTER — Inpatient Hospital Stay (HOSPITAL_COMMUNITY): Payer: Medicare PPO | Admitting: Physical Therapy

## 2019-04-02 ENCOUNTER — Encounter (HOSPITAL_COMMUNITY): Payer: Medicare PPO | Admitting: Speech Pathology

## 2019-04-02 ENCOUNTER — Inpatient Hospital Stay (HOSPITAL_COMMUNITY): Payer: Medicare PPO | Admitting: Occupational Therapy

## 2019-04-02 ENCOUNTER — Inpatient Hospital Stay (HOSPITAL_COMMUNITY): Payer: Medicare PPO | Admitting: Speech Pathology

## 2019-04-02 LAB — CBC
HCT: 21.9 % — ABNORMAL LOW (ref 39.0–52.0)
Hemoglobin: 7.2 g/dL — ABNORMAL LOW (ref 13.0–17.0)
MCH: 33 pg (ref 26.0–34.0)
MCHC: 32.9 g/dL (ref 30.0–36.0)
MCV: 100.5 fL — ABNORMAL HIGH (ref 80.0–100.0)
Platelets: 201 10*3/uL (ref 150–400)
RBC: 2.18 MIL/uL — ABNORMAL LOW (ref 4.22–5.81)
RDW: 15.6 % — ABNORMAL HIGH (ref 11.5–15.5)
WBC: 7.8 10*3/uL (ref 4.0–10.5)
nRBC: 0 % (ref 0.0–0.2)

## 2019-04-02 LAB — RENAL FUNCTION PANEL
Albumin: 1.9 g/dL — ABNORMAL LOW (ref 3.5–5.0)
Anion gap: 12 (ref 5–15)
BUN: 42 mg/dL — ABNORMAL HIGH (ref 8–23)
CO2: 22 mmol/L (ref 22–32)
Calcium: 7.7 mg/dL — ABNORMAL LOW (ref 8.9–10.3)
Chloride: 103 mmol/L (ref 98–111)
Creatinine, Ser: 8.04 mg/dL — ABNORMAL HIGH (ref 0.61–1.24)
GFR calc Af Amer: 7 mL/min — ABNORMAL LOW (ref >60)
GFR calc non Af Amer: 6 mL/min — ABNORMAL LOW (ref >60)
Glucose, Bld: 155 mg/dL — ABNORMAL HIGH (ref 70–99)
Phosphorus: 3.7 mg/dL (ref 2.5–4.6)
Potassium: 4.3 mmol/L (ref 3.5–5.1)
Sodium: 137 mmol/L (ref 135–145)

## 2019-04-02 LAB — GLUCOSE, CAPILLARY
Glucose-Capillary: 147 mg/dL — ABNORMAL HIGH (ref 70–99)
Glucose-Capillary: 141 mg/dL — ABNORMAL HIGH (ref 70–99)
Glucose-Capillary: 169 mg/dL — ABNORMAL HIGH (ref 70–99)

## 2019-04-02 MED ORDER — HEPARIN SODIUM (PORCINE) 1000 UNIT/ML IJ SOLN
INTRAMUSCULAR | Status: AC
  Administered 2019-04-02: 3400 [IU]
  Filled 2019-04-02: qty 4

## 2019-04-02 NOTE — Progress Notes (Signed)
Speech Language Pathology Weekly Progress and Session Note  Patient Details  Name: Kyle [name] MRN: [id number] Date of Birth: Dec 07, 1950  Beginning of progress report period: March 24, 2019 End of progress report period: April 02, 2019  Today's Date: 04/02/2019 SLP Individual Time: 7035-0093 SLP Individual Time Calculation (min): 15 min  Short Term Goals: Week 2: SLP Short Term Goal 1 (Week 2): Pt will consume current diet with minimal overt s/s of aspiration/dysphagia and Min A cues for use of safe swallow strategies. SLP Short Term Goal 1 - Progress (Week 2): Met SLP Short Term Goal 2 (Week 2): Pt will demonstrate use of speech intelligibility strategies (over articulation and slow rate) at sentence level for 90% intelligibility with min A verbal cues. SLP Short Term Goal 2 - Progress (Week 2): Not met SLP Short Term Goal 3 (Week 2): Pt will demonstrate sustained attention in 15 minute intervals with min A verbal cues for redirection during functional tasks. SLP Short Term Goal 3 - Progress (Week 2): Not met SLP Short Term Goal 4 (Week 2): Pt will answer yes/no quesitons to demonstrate intellectual awareness of cognition/communication/swallow deficits and physical deficits in 5 out of 10 opportunities with Min A cues. SLP Short Term Goal 4 - Progress (Week 2): Not met SLP Short Term Goal 5 (Week 2): Pt will complete functional and basic problem solving tasks with min A verbal cues. SLP Short Term Goal 5 - Progress (Week 2): Not met SLP Short Term Goal 6 (Week 2): Pt will utilize external compensatory memory strategies to recall information in 5 out of 10 opportunities with Min A cues. SLP Short Term Goal 6 - Progress (Week 2): Not met    New Short Term Goals: Week 3: SLP Short Term Goal 1 (Week 3): Pt will complete pharyngeal exercises targeting anterior hyoid movement with Min A cues. SLP Short Term Goal 2 (Week 3): Pt will  use speech intelligibility strategies (over  articulation and slow rate) at sentence level for 90% intelligibility with Mod A verbal cues. SLP Short Term Goal 3 (Week 3): Pt will demonstrate sustained attention in 10 minute intervals with Mod A verbal cues for redirection during functional tasks. SLP Short Term Goal 4 (Week 3): Pt will answer yes/no quesitons to demonstrate intellectual awareness of cognition/communication/swallow deficits and physical deficits in 5 out of 10 opportunities with Mod A cues. SLP Short Term Goal 5 (Week 3): Pt will complete functional and basic problem solving tasks with Mod A verbal cues. SLP Short Term Goal 6 (Week 3): Pt will utilize external compensatory memory strategies to recall information in 5 out of 10 opportunities with Mod A cues.  Weekly Progress Updates:  Over this reporting period, pt participated in instrumental swallow study that revealed whole pills (with liquid or with puree) were not safe. Therefore recommend that all medicine be crushed with puree. Pt continues to require skilled ST to target sustained attention, use of memory strategies, basic problem solving, increasing intellectual awareness and speech intelligibility at the sentence level. As such, recommend continuing therapy at SNF.      Intensity: Minumum of 1-2 x/day, 30 to 90 minutes Frequency: 3 to 5 out of 7 days Duration/Length of Stay: pending SNF placement Treatment/Interventions: Cognitive remediation/compensation;Cueing hierarchy;Dysphagia/aspiration precaution training;Functional tasks;Internal/external aids;Patient/family education   Daily Session  Skilled Therapeutic Interventions: Skilled treatment session trageted education of results of MBS and recommendations. SLP provided information that all medicine must be crushed with puree. Pt states that he "has always had  trouble taking medicine whole." Pt continues to require Max to Mod A for sustained attention to topic of conversation as he continued to ask about being  able to walk to bathroom by himself. Information has been posted in chart and at bedside regarding crushing medication. Pt left upright in wheelchair, lap belt alarm on and all needs within reach.       General    Pain Pain Assessment Pain Scale: Faces Faces Pain Scale: Hurts a little bit Pain Type: Acute pain Pain Location: Arm Pain Orientation: Right Pain Descriptors / Indicators: Discomfort Pain Onset: On-going Pain Intervention(s): Repositioned;Cold applied;Emotional support  Therapy/Group: Individual Therapy  Lamarkus Nebel 04/02/2019, 2:36 PM

## 2019-04-02 NOTE — Progress Notes (Signed)
Modified Barium Swallow Progress Note  Patient Details  Name: Kyle [name] MRN: [id number] Date of Birth: Sep 24, 1950  Today's Date: 04/02/2019  Modified Barium Swallow completed.  Full report located under Chart Review in the Imaging Section.  Brief recommendations include the following:  Clinical Impression  Pt presents with mild pharyngeal dysphagia c/b decreased anterior hyoid movement. As such, pt has moderate amounts of vallecular residue with dysphagia 3. Liquid wash was helpful in clearing residue as pt had better response to heavier bolus when dysphagia 3 and liquid were present with liquid wash. Whole pill was administered with just thin liquids and also whole in puree. With each, whole pill was difficult to clear from the vallecula and required multiple boluses of puree to clear. Medicines must be Crushed in puree.  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 Recommend pt continue dysphagia 3, thin liquids with medicine crushed in puree.   Swallow Evaluation Recommendations       SLP Diet Recommendations: Dysphagia 3 (Mech soft) solids;Thin liquid   Liquid Administration via: Cup;Straw   Medication Administration: Crushed with puree   Supervision: Patient able to self feed   Compensations: Minimize environmental distractions;Slow rate;Small sips/bites   Postural Changes: Seated upright at 90 degrees   Oral Care Recommendations: Oral care BID        Thekla Colborn 04/02/2019,2:26 PM

## 2019-04-02 NOTE — Progress Notes (Signed)
[facility] ROUNDING NOTE   Subjective:   HD today.  Doing OK.    Objective:  Vital signs in last 24 hours:  Temp:  [97.8 F (36.6 C)-98.2 F (36.8 C)] 98.2 F (36.8 C) (02/04 1638) Pulse Rate:  [60-81] 78 (02/04 1638) Resp:  [16-18] 18 (02/04 1638) BP: (117-163)/(55-78) 138/66 (02/04 1638) SpO2:  [98 %-100 %] 98 % (02/04 1638) Weight:  [95 kg-97.8 kg] 95 kg (02/04 1638)  Weight change: 1.2 kg Filed Weights   04/02/19 0530 04/02/19 1248 04/02/19 1638  Weight: 97.6 kg 97.8 kg 95 kg    Intake/Output: I/O last 3 completed shifts: In: 960 [P.O.:960] Out: -    Intake/Output this shift:  Total I/O In: 240 [P.O.:240] Out: 2061 [Urine:1; WVPXT:0626; Stool:1]  RSW:NIOEVOJJKKX ill, NAD  HENT NCAT   CV: S1S2 no rub   PULM:Clear bilaterally, normal work of breathing, FGH:WEXH, nontender. ND  SKIN:No rashes or lesions EXT:No significant edema   Access: RIJ tunneled dialysis catheter; LUE AVF bruit and thrill, PD catheter intact   Basic Metabolic Panel: Recent Labs  Lab 03/29/19 0706 03/29/19 0706 03/30/19 0610 03/30/19 0610 03/30/19 0708 03/31/19 0541 04/01/19 0708 04/02/19 0525  NA 138   < > 136  --  135 133* 136 137  K 4.3   < > 4.0  --  4.2 4.4 3.9 4.3  CL 101   < > 99  --  98 96* 97* 103  CO2 26  --  24  --   --  22 25 22   GLUCOSE 139*   < > 137*  --  121* 307* 140* 155*  BUN 44*   < > 44*  --  41* 56* 34* 42*  CREATININE 8.64*   < > 8.37*  --  9.20* 9.17* 6.46* 8.04*  CALCIUM 8.6*   < > 8.4*   < >  --  7.8* 7.4* 7.7*  PHOS 5.0*  --  4.9*  --   --  4.4 3.4 3.7   < > = values in this interval not displayed.    Liver Function Tests: Recent Labs  Lab 03/29/19 0706 03/30/19 0610 03/31/19 0541 04/01/19 0708 04/02/19 0525  ALBUMIN 1.9* 1.8* 1.9* 1.8* 1.9*   CBC: Recent Labs  Lab 03/30/19 1348 03/30/19 1829 03/31/19 0541 04/01/19 0708 04/02/19 0629  WBC 12.8* 12.5* 13.2* 8.7 7.8  HGB 7.5* 7.6* 6.7* 7.1* 7.2*  HCT 23.2* 23.1*  20.3* 21.6* 21.9*  MCV 103.1* 101.3* 101.5* 101.4* 100.5*  PLT 229 236 225 195 201    CBG: Recent Labs  Lab 04/01/19 1131 04/01/19 1638 04/01/19 2103 04/02/19 0602 04/02/19 1706  GLUCAP 292* 292* 283* 147* 141*    Microbiology: Results for orders placed or performed during the hospital encounter of 03/20/19  Culture, Urine     Status: None   Collection Time: 03/22/19  4:15 PM   Specimen: Urine, Catheterized  Result Value Ref Range Status   Specimen Description URINE, CATHETERIZED  Final   Special Requests SITE NOT SPECIFIED  Final   Culture   Final    NO GROWTH Performed at [facility], [address], [place], [name] [address]    Report Status 03/23/2019 FINAL  Final    Imaging: DG Swallowing Func-Speech Pathology  Result Date: 04/02/2019 Objective Swallowing Evaluation: Type of Study: MBS-Modified Barium Swallow Study  Patient Details Name: Kyle [name] MRN: [id number] Date of Birth: September 26, 1950 Today's Date: 04/02/2019 Time: SLP Start Time (ACUTE ONLY): 7510 -SLP  Stop Time (ACUTE ONLY): 1519 SLP Time Calculation (min) (ACUTE ONLY): 24 min Past Medical History: Past Medical History: Diagnosis Date . Anxiety  . BPH (benign prostatic hyperplasia)  . Chronic kidney disease  . Colon polyps  . COPD (chronic obstructive pulmonary disease) ([place])  . Depression  . Diabetes mellitus without complication ([place])  . Diverticulosis of colon  . Hyperlipidemia  . Hypertension  . Hypothyroidism  . Nephrolithiasis  . Seizures ([place])  Past Surgical History: Past Surgical History: Procedure Laterality Date . AV FISTULA PLACEMENT Left 03/30/2019  Procedure: right brachiocephalic fistula creation;  Surgeon: Marty Heck, MD;  Location: [facility];  Service: Vascular;  Laterality: Left; . COLONOSCOPY   . COLONOSCOPY WITH PROPOFOL N/A 05/30/2015  Procedure: COLONOSCOPY WITH PROPOFOL;  Surgeon: Manya Silvas, MD;  Location: [facility] ENDOSCOPY;  Service: Endoscopy;  Laterality: N/A; . COLONOSCOPY WITH  PROPOFOL N/A 12/01/2018  Procedure: COLONOSCOPY WITH PROPOFOL;  Surgeon: Toledo, [name], MD;  Location: [facility] ENDOSCOPY;  Service: Gastroenterology;  Laterality: N/A; . INSERTION OF DIALYSIS CATHETER Right 03/30/2019  Procedure: Ultrasound guided right internal jugular tunneled dialysis catheter placement;  Surgeon: Marty Heck, MD;  Location: [facility] OR;  Service: Vascular;  Laterality: Right; . kidney stone   . THYROIDECTOMY   . VARICOCELE EXCISION   . VIDEO BRONCHOSCOPY Bilateral 05/25/2016  Procedure: VIDEO BRONCHOSCOPY WITHOUT FLUORO;  Surgeon: Juanito Doom, MD;  Location[facility] ENDOSCOPY;  Service: Cardiopulmonary;  Laterality: Bilateral; HPI: Pt is a 69 y/o male admitted for hypocalcemia. Pt has PMH to include Multiple medical issues including ESRD on peritoneal dialysis daily but has "missed" sessions per pt report, COPD, HTN, and seizures, Tobacco and ETOH use, Lung R Nodule, collapse of R lung. Pt currently receiving HHPT, however has been declining physically and reports multiple falls. 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 Chronic small-vessel ischemic changes elsewhere throughout the brain.  Prior to admission per chart notes, pt reports that he has been feeling very lightheaded and weak for about the past 24 hours.  He had an episode earlier today where he was eating at a restaurant and suddenly felt very shaky, " like I was going to have a seizure".  He reports his last seizure being over 5 years ago and did not end up having a seizure episode today, states he has been compliant with his Keppra.  He had an appointment with his GI doctor later in the day for chronic diarrhea and had lab work performed at that time.  He then received a call this evening that his Calcium was critically low.  Per NSG report this AM, pt was coughing when trying to swallow Pills.  Subjective: Patient alert, sitting up in bed,  dysarthric speech Assessment / Plan / Recommendation CHL IP CLINICAL IMPRESSIONS 04/02/2019 Clinical Impression Pt presents with mild pharyngeal dysphagia c/b decreased anterior hyoid movement. As such, pt has moderate amounts of vallecular residue with dysphagia 3. Liquid wash was helpful in clearing residue as pt had better response to heavier bolus when dysphagia 3 and liquid were present with liquid wash. Whole pill was administered with just thin liquids and also whole in puree. With each, whole pill was difficult to clear from the vallecula and required multiple boluses of puree to clear. Medicine must be CRUSHED with puree. Pt has curved epiglottis that at times prevents full deflection but pt is able to protect his airway  throughout study. Recommend pt continue dysphagia 3, thin liquids with medicine crushed in puree. SLP Visit Diagnosis Dysphagia, pharyngeal phase (R13.13) Attention and concentration deficit following -- Frontal lobe and executive function deficit following -- Impact on safety and function Mild aspiration risk   CHL IP TREATMENT RECOMMENDATION 04/02/2019 Treatment Recommendations Therapy as outlined in treatment plan below   Prognosis 03/17/2019 Prognosis for Safe Diet Advancement Fair Barriers to Reach Goals Cognitive deficits;Language deficits;Severity of deficits;Time post onset Barriers/Prognosis Comment -- CHL IP DIET RECOMMENDATION 04/02/2019 SLP Diet Recommendations Dysphagia 3 (Mech soft) solids;Thin liquid Liquid Administration via Cup;Straw Medication Administration Crushed with puree Compensations Minimize environmental distractions;Slow rate;Small sips/bites Postural Changes Seated upright at 90 degrees   CHL IP OTHER RECOMMENDATIONS 04/02/2019 Recommended Consults -- Oral Care Recommendations Oral care BID Other Recommendations --   CHL IP FOLLOW UP RECOMMENDATIONS 04/02/2019 Follow up Recommendations Skilled Nursing facility   [facility] IP FREQUENCY AND DURATION 03/18/2019 Speech Therapy  Frequency (ACUTE ONLY) min 3x week Treatment Duration --      CHL IP ORAL PHASE 04/02/2019 Oral Phase WFL Oral - Pudding Teaspoon -- Oral - Pudding Cup -- Oral - Honey Teaspoon -- Oral - Honey Cup -- Oral - Nectar Teaspoon -- Oral - Nectar Cup -- Oral - Nectar Straw -- Oral - Thin Teaspoon -- Oral - Thin Cup -- Oral - Thin Straw -- Oral - Puree -- Oral - Mech Soft -- Oral - Regular -- Oral - Multi-Consistency -- Oral - Pill -- Oral Phase - Comment --  CHL IP PHARYNGEAL PHASE 04/02/2019 Pharyngeal Phase -- Pharyngeal- Pudding Teaspoon -- Pharyngeal -- Pharyngeal- Pudding Cup -- Pharyngeal -- Pharyngeal- Honey Teaspoon -- Pharyngeal -- Pharyngeal- Honey Cup -- Pharyngeal -- Pharyngeal- Nectar Teaspoon -- Pharyngeal -- Pharyngeal- Nectar Cup -- Pharyngeal -- Pharyngeal- Nectar Straw -- Pharyngeal -- Pharyngeal- Thin Teaspoon -- Pharyngeal -- Pharyngeal- Thin Cup -- Pharyngeal -- Pharyngeal- Thin Straw -- Pharyngeal -- Pharyngeal- Puree Delayed swallow initiation-vallecula Pharyngeal -- Pharyngeal- Mechanical Soft Compensatory strategies attempted (with notebox);Pharyngeal residue - valleculae Pharyngeal -- Pharyngeal- Regular -- Pharyngeal -- Pharyngeal- Multi-consistency -- Pharyngeal -- Pharyngeal- Pill Pharyngeal residue - valleculae;Other (Comment);Compensatory strategies attempted (with notebox) Pharyngeal -- Pharyngeal Comment --  CHL IP CERVICAL ESOPHAGEAL PHASE 04/02/2019 Cervical Esophageal Phase WFL Pudding Teaspoon -- Pudding Cup -- Honey Teaspoon -- Honey Cup -- Nectar Teaspoon -- Nectar Cup -- Nectar Straw -- Thin Teaspoon -- Thin Cup -- Thin Straw -- Puree -- Mechanical Soft -- Regular -- Multi-consistency -- Pill -- Cervical Esophageal Comment -- Happi Overton 04/02/2019, 2:28 PM                Medications:    . sodium chloride   Intravenous Once  . amLODipine  5 mg Oral Daily  . aspirin EC  81 mg Oral Daily  . atorvastatin  80 mg Oral Daily  . calcium acetate  1,334 mg Oral TID WC  . Chlorhexidine  Gluconate Cloth  6 each Topical Daily  . Chlorhexidine Gluconate Cloth  6 each Topical Q0600  . clopidogrel  75 mg Oral Daily  . darbepoetin (ARANESP) injection - NON-DIALYSIS  25 mcg Subcutaneous Q Sun-1800  . feeding supplement (NEPRO CARB STEADY)  237 mL Oral BID BM  . gabapentin  100 mg Oral BID  . insulin aspart  0-15 Units Subcutaneous TID WC  . insulin glargine  30 Units Subcutaneous Daily  . levETIRAcetam  500 mg Oral BID  . levothyroxine  200 mcg Oral q morning - 10a  .  lipase/protease/amylase  24,000 Units Oral TID WC  . metoprolol succinate  50 mg Oral Daily  . modafinil  100 mg Oral Daily  . multivitamin  1 tablet Oral Daily   acetaminophen, bisacodyl, guaiFENesin-dextromethorphan, polyethylene glycol, prochlorperazine **OR** prochlorperazine **OR** prochlorperazine, Resource ThickenUp Clear  Assessment/ Plan:  ESRD transitioning from PD to HD--> HD #1 2/2,  HD 2/4.  S/p 1u pRBCs in HD 2/2   Anemia - question acute blood loss as well as CKD - iron saturation 20% - s/p Ferrlecit and darbepoetin 25 mcg weekly given on 03/29/2019. May have been off of ESA with CVA? Will need to inc dose as able.  Note severe anemia on 2/1.  May need GI consult   History of hypokalemia. Acceptable   Bone mineral continue binders vitamin D.  Continues on PhosLo with meals. vitamin D discontinued due to hypercalcemia at this point.  PTH 91.  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

## 2019-04-02 NOTE — Plan of Care (Signed)
  Problem: RH Swallowing Goal: LTG Patient will consume least restrictive diet using compensatory strategies with assistance (SLP) Description: LTG:  Patient will consume least restrictive diet using compensatory strategies with assistance (SLP) Outcome: Completed/Met   Problem: RH Swallowing Goal: LTG Patient will participate in dysphagia therapy to increase swallow function with assistance (SLP) Description: LTG:  Patient will participate in dysphagia therapy to increase swallow function with assistance (SLP) Flowsheets (Taken 04/02/2019 1356) LTG: Pt will participate in dysphagia therapy to increase swallow function with assistance of (SLP): Minimal Assistance - Patient > 75% Note: Specifically exercises targeting hyoid excursion   Problem: RH Swallowing Goal: LTG Pt will demonstrate functional change in swallow as evidenced by bedside/clinical objective assessment (SLP) Description: LTG: Patient will demonstrate functional change in swallow as evidenced by bedside/clinical objective assessment (SLP) Outcome: Completed/Met   Problem: RH Memory Goal: LTG Patient will demonstrate ability for day to day (SLP) Description: LTG:   Patient will demonstrate ability for day to day recall/carryover during cognitive/linguistic activities with assist  (SLP) Flowsheets (Taken 04/02/2019 1356) LTG: Patient will demonstrate ability for day to day recall/carryover during cognitive/linguistic activities with assist (SLP):  Maximal Assistance - Patient 25 - 49%  Moderate Assistance - Patient 50 - 74% Note: Downgraded d/t slow progress towards goal   Problem: RH Attention Goal: LTG Patient will demonstrate this level of attention during functional activites (SLP) Description: LTG:  Patient will will demonstrate this level of attention during functional activites (SLP) Flowsheets (Taken 04/02/2019 1356) Patient will demonstrate this level of attention during cognitive/linguistic activities in:  Controlled Number of minutes patient will demonstrate attention during cognitive/linguistic activities: 10 Note: Downgraded d/t slow progress towards goal   Problem: RH Awareness Goal: LTG: Patient will demonstrate awareness during functional activites type of (SLP) Description: LTG: Patient will demonstrate awareness during functional activites type of (SLP) Flowsheets (Taken 04/02/2019 1356) LTG: Patient will demonstrate awareness during cognitive/linguistic activities with assistance of (SLP): Maximal Assistance - Patient 25 - 49% Note: Downgraded d/t slow progress towards goal

## 2019-04-02 NOTE — Plan of Care (Signed)
  Problem: RH SKIN INTEGRITY Goal: RH STG SKIN FREE OF INFECTION/BREAKDOWN Description: Skin to remain free from infection and breakdown while on rehab with min assist. Outcome: Not Progressing; right  fore arm redness and tenderness; MD aware new order for ice

## 2019-04-02 NOTE — Progress Notes (Signed)
[facility] PHYSICAL MEDICINE & REHABILITATION PROGRESS NOTE   Subjective/Complaints: Mr. Roker asks about the future plan for his HD He shows me some right distal forearm swelling that is tender to palpation. He asks whether certain bandages can be removed.  Labs stable this morning except for increase in Creatinine.   ROS: Patient denies nausea, vomiting, diarrhea, cough, shortness of breath or chest pain, joint or back pain, headache, or mood change.    Objective:   No results found. Recent Labs    04/01/19 0708 04/02/19 0629  WBC 8.7 7.8  HGB 7.1* 7.2*  HCT 21.6* 21.9*  PLT 195 201   Recent Labs    04/01/19 0708 04/02/19 0525  NA 136 137  K 3.9 4.3  CL 97* 103  CO2 25 22  GLUCOSE 140* 155*  BUN 34* 42*  CREATININE 6.46* 8.04*  CALCIUM 7.4* 7.7*    Intake/Output Summary (Last 24 hours) at 04/02/2019 0945 Last data filed at 04/02/2019 0935 Gross per 24 hour  Intake 840 ml  Output 2 ml  Net 838 ml     Physical Exam: Vital Signs Blood pressure (!) 122/55, pulse 60, temperature 97.8 F (36.6 C), temperature source Oral, resp. rate 16, height 6' (1.829 m), weight 97.6 kg, SpO2 98 %. Constitutional: No distress . Vital signs reviewed. HEENT: EOMI, oral membranes moist.Ptosis ~50%  Neck: supple Cardiovascular: RRR without murmur. No JVD    Respiratory: CTA Bilaterally without wheezes or rales. Normal effort    GI: BS +, non-tender, non-distended   Musculoskeletal:  General: Normal range of motion.  no pain with ROM   Right forearm with swelling, tender to palpation, along distal radial aspect.  Neurological: lethargic. Followed basic commands ie pick up R foot, pick up left foot, did not squeeze to command .c. Left CN III palsy with ptosiscould not do full MMT due to lethargy Skin: Skin iswarm.  Psychiatric: flat  Assessment/Plan: 1. Functional deficits secondary to left paramedian midbrain infarct which require 3+ hours per day of interdisciplinary  therapy in a comprehensive inpatient rehab setting.  Physiatrist is providing close team supervision and 24 hour management of active medical problems listed below.  Physiatrist and rehab team continue to assess barriers to discharge/monitor patient progress toward functional and medical goals  Care Tool:  Bathing  Bathing activity did not occur: Refused Body parts bathed by patient: Right arm, Left arm, Chest, Abdomen, Front perineal area, Right upper leg, Left upper leg, Face, Buttocks, Right lower leg, Left lower leg   Body parts bathed by helper: Buttocks, Right lower leg, Left lower leg     Bathing assist Assist Level: Minimal Assistance - Patient > 75%     Upper Body Dressing/Undressing Upper body dressing   What is the patient wearing?: Pull over shirt    Upper body assist Assist Level: Minimal Assistance - Patient > 75%    Lower Body Dressing/Undressing Lower body dressing      What is the patient wearing?: Incontinence brief     Lower body assist Assist for lower body dressing: Moderate Assistance - Patient 50 - 74%     Toileting Toileting Toileting Activity did not occur (Clothing management and hygiene only): N/A (no void or bm)  Toileting assist Assist for toileting: Moderate Assistance - Patient 50 - 74%     Transfers Chair/bed transfer  Transfers assist  Chair/bed transfer activity did not occur: Safety/medical concerns  Chair/bed transfer assist level: Minimal Assistance - Patient > 75%  Locomotion Ambulation   Ambulation assist      Assist level: Moderate Assistance - Patient 50 - 74% Assistive device: Walker-rolling Max distance: 25   Walk 10 feet activity   Assist     Assist level: Moderate Assistance - Patient - 50 - 74% Assistive device: Walker-rolling   Walk 50 feet activity   Assist Walk 50 feet with 2 turns activity did not occur: Safety/medical concerns  Assist level: Moderate Assistance - Patient - 50 -  74% Assistive device: No Device, Hand held assist    Walk 150 feet activity   Assist Walk 150 feet activity did not occur: Safety/medical concerns         Walk 10 feet on uneven surface  activity   Assist Walk 10 feet on uneven surfaces activity did not occur: Safety/medical concerns(Per report)         Wheelchair     Assist   Type of Wheelchair: Manual    Wheelchair assist level: Minimal Assistance - Patient > 75% Max wheelchair distance: 171ft    Wheelchair 50 feet with 2 turns activity    Assist        Assist Level: Minimal Assistance - Patient > 75%   Wheelchair 150 feet activity     Assist      Assist Level: Moderate Assistance - Patient 50 - 74%   Blood pressure (!) 122/55, pulse 60, temperature 97.8 F (36.6 C), temperature source Oral, resp. rate 16, height 6' (1.829 m), weight 97.6 kg, SpO2 98 %.  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 Antithrombotics: -DVT/anticoagulation:Pharmaceutical:Heparin -antiplatelet therapy: ASA/PLAVIX X 3 weeks followed by ASA alone. 3. Pain Management:tylenol prn 4. Mood:LCSW to follow for evaluation and support. -antipsychotic agents: N/A 5. Neuropsych: This patientiscapable of making decisions on hisown behalf. 6. Skin/Wound Care:Routine pressure relief measures.  2/4: Has area of swelling that is tender to palpation along right distal radial aspect of forearm. Placed general nursing order for ice administration to reduce pain and swelling.  7. Fluids/Electrolytes/Nutrition:Monitor I/O. Has been refusing nectar liquids. Push PO  2/4: Electrolytes personally reviewed this morning and stable with exception of rising Creatinine.  8. T2DM with neuropathy/nephropathy: Hgb A1c- Was  on ozempic, Lantus, Humalog and glucotrol PTA.            CBG (last 3)  Recent Labs    04/01/19 1638 04/01/19 2103 04/02/19 0602  GLUCAP 292* 283* 147*  fair control 2/3  9. ESRD- on PD at nights.per nephro convert to HD due to SNF placement,Has IJ, received PD last noc.  2/4: Personally reviewed labs today. Creatinine increased to 8.04 from 6.46. Has first HD today; appreciate Nephrology notes. Discussed plan of care with patient and answered his questions.      10. Hypokalemia/Hypomagnesemia: supps per nephro. - nephro ordered KCL 64meq TID 11. Anemia of chronic disease: On aranesp weekly. 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 Chronic diarrhea:Continue creon tid with meals.   -no diarrhea currently 14.HTN: Monitor BP tid--on metoprolol, losartan and low dose amlodipine             1/24 fair control  1/30: labile  1/31:  well controlled.  15. Chronic dizziness/Orthostatic changes?:  no dizziness reported   -will check orthostatic VS x 2 days 16. Prolonged QTC: QTC 528 on EKG 1/14 17. Lethargy: improved , post op 47.  Dysphagia - stroke no sign of aspiration     LOS: 13 days A FACE TO FACE EVALUATION WAS PERFORMED  Hughey Rittenberry P Lakindra Wible 04/02/2019, 9:45 AM

## 2019-04-02 NOTE — Progress Notes (Signed)
Physical Therapy Session Note  Patient Details  Name: Kyle [name] MRN: [id number] Date of Birth: Mar 31, 1950  Today's Date: 04/02/2019   Short Term Goals: Week 2:  PT Short Term Goal 1 (Week 2): Pt will maintain standing balance with min assist up to 5 minutes while engaged in functional task PT Short Term Goal 2 (Week 2): Pt will ambulate 35ft with min assist and LRAD PT Short Term Goal 3 (Week 2): Pt will transfer to [facility] with min assist and LRAD consistently PT Short Term Goal 4 (Week 2): Pt will maintian sitting balance with supervision assist  Skilled Therapeutic Interventions/Progress Updates:   PT attempted to see pt for treatment at 1300 and 1500, but pt off unit for hemodialysis. Will re-attempt at lateral time/date provided pt in medically appropriate.      Therapy Documentation Precautions:  Precautions Precautions: Fall Restrictions Weight Bearing Restrictions: No General: PT Amount of Missed Time (min): 90 Minutes PT Missed Treatment Reason: Unavailable (Comment)(Hemodialysis) Vital Signs: Therapy Vitals Temp: 98.2 F (36.8 C) Temp Source: Oral Pulse Rate: 78 Resp: 18 BP: 138/66 Patient Position (if appropriate): Lying Oxygen Therapy SpO2: 98 % O2 Device: Room Air    Therapy/Group: Individual Therapy  Lorie Phenix 04/02/2019, 5:09 PM

## 2019-04-02 NOTE — Progress Notes (Signed)
Occupational Therapy Session Note  Patient Details  Name: Kyle [name] MRN: [id number] Date of Birth: 1950/05/05  Today's Date: 04/02/2019 OT Individual Time: 0932-6712 OT Individual Time Calculation (min): 57 min    Short Term Goals: Week 2:  OT Short Term Goal 1 (Week 2): STGs=LTGs due to ELOS  Skilled Therapeutic Interventions/Progress Updates:    Pt in bed asleep to start session.  He was easily woken up and agreed to OT session.  He was able to transfer from supine to sit EOB with min assist.  Min assist was then needed for stand pivot transfer from the EOB to the wheelchair with use of the RW and mod demonstrational cueing for hand placement with sit to stand.  He was then able to complete all bathing sit to stand with min assist and mod instructional cueing for thoroughness and use of soap.  He was not oriented to the day of the week but was within 2 days with regards to the day of the month.  He was able to remember having a swallowing test this morning, but only with mod questioning cueing from therapist.  He was able to complete dressing with overall min assist, except for donning and tightening the brief, which required mod assist.  Increased lean to the right in standing with one LOB posteriorly when attempting to pull pants over his hips.  Finished session with pt in the wheelchair at bedside in preparation for lunch.  Ice pack applied to the right forearm and secondary to a raised spot on the arm with increased pain and some redness.  Nursing is aware of this.  Pt's schedule for the rest of the day was written out in large print as he expressed frustration at not being able to see the current schedules they bring.  Call button and phone in reach with safety alarm belt in place.    Therapy Documentation Precautions:  Precautions Precautions: Fall Restrictions Weight Bearing Restrictions: No  Pain: Pain Assessment Pain Scale: Faces Faces Pain Scale: Hurts a little bit Pain  Type: Acute pain Pain Location: Arm Pain Orientation: Right Pain Descriptors / Indicators: Discomfort Pain Onset: On-going Pain Intervention(s): Repositioned;Cold applied;Emotional support ADL: See Care Tool Section for some details of mobility and selfcare  Therapy/Group: Individual Therapy  Honest Safranek OTR/L 04/02/2019, 12:53 PM

## 2019-04-03 ENCOUNTER — Inpatient Hospital Stay (HOSPITAL_COMMUNITY): Payer: Medicare PPO | Admitting: Physical Therapy

## 2019-04-03 ENCOUNTER — Inpatient Hospital Stay (HOSPITAL_COMMUNITY): Payer: Medicare PPO | Admitting: Occupational Therapy

## 2019-04-03 ENCOUNTER — Inpatient Hospital Stay (HOSPITAL_COMMUNITY): Payer: Medicare PPO | Admitting: Speech Pathology

## 2019-04-03 LAB — BPAM RBC
Blood Product Expiration Date: 202102262359
Blood Product Expiration Date: 202102262359
Blood Product Expiration Date: 202102262359
Blood Product Expiration Date: 202102262359
ISSUE DATE / TIME: 202102010822
ISSUE DATE / TIME: 202102021450
Unit Type and Rh: 5100
Unit Type and Rh: 5100
Unit Type and Rh: 5100
Unit Type and Rh: 5100

## 2019-04-03 LAB — TYPE AND SCREEN
ABO/RH(D): O POS
Antibody Screen: NEGATIVE
Unit division: 0
Unit division: 0
Unit division: 0
Unit division: 0

## 2019-04-03 LAB — GLUCOSE, CAPILLARY
Glucose-Capillary: 319 mg/dL — ABNORMAL HIGH (ref 70–99)
Glucose-Capillary: 147 mg/dL — ABNORMAL HIGH (ref 70–99)
Glucose-Capillary: 224 mg/dL — ABNORMAL HIGH (ref 70–99)
Glucose-Capillary: 145 mg/dL — ABNORMAL HIGH (ref 70–99)
Glucose-Capillary: 140 mg/dL — ABNORMAL HIGH (ref 70–99)

## 2019-04-03 LAB — RENAL FUNCTION PANEL
Albumin: 2 g/dL — ABNORMAL LOW (ref 3.5–5.0)
Anion gap: 10 (ref 5–15)
BUN: 30 mg/dL — ABNORMAL HIGH (ref 8–23)
CO2: 28 mmol/L (ref 22–32)
Calcium: 7.8 mg/dL — ABNORMAL LOW (ref 8.9–10.3)
Chloride: 101 mmol/L (ref 98–111)
Creatinine, Ser: 5.32 mg/dL — ABNORMAL HIGH (ref 0.61–1.24)
GFR calc Af Amer: 12 mL/min — ABNORMAL LOW (ref >60)
GFR calc non Af Amer: 10 mL/min — ABNORMAL LOW (ref >60)
Glucose, Bld: 159 mg/dL — ABNORMAL HIGH (ref 70–99)
Phosphorus: 2.3 mg/dL — ABNORMAL LOW (ref 2.5–4.6)
Potassium: 4.1 mmol/L (ref 3.5–5.1)
Sodium: 139 mmol/L (ref 135–145)

## 2019-04-03 LAB — OCCULT BLOOD X 1 CARD TO LAB, STOOL: Fecal Occult Bld: POSITIVE — AB

## 2019-04-03 MED ORDER — SODIUM CHLORIDE 0.9 % IV SOLN
125.0000 mg | INTRAVENOUS | Status: DC
  Administered 2019-04-04: 125 mg via INTRAVENOUS
  Filled 2019-04-03: qty 10

## 2019-04-03 MED ORDER — DARBEPOETIN ALFA 60 MCG/0.3ML IJ SOSY
60.0000 ug | PREFILLED_SYRINGE | INTRAMUSCULAR | Status: DC
  Administered 2019-04-04: 13:00:00 60 ug via INTRAVENOUS
  Filled 2019-04-03: qty 0.3

## 2019-04-03 MED ORDER — CHLORHEXIDINE GLUCONATE CLOTH 2 % EX PADS
6.0000 | MEDICATED_PAD | Freq: Every day | CUTANEOUS | Status: DC
  Administered 2019-04-03 – 2019-04-04 (×2): 6 via TOPICAL

## 2019-04-03 NOTE — Progress Notes (Signed)
Physical Therapy Weekly Progress Note  Patient Details  Name: Kyle [name] MRN: [id number] Date of Birth: May 31, 1950  Beginning of progress report period: March 27, 2019 End of progress report period: April 03, 2019  Today's Date: 04/03/2019 PT Individual Time: 1300-1410 PT Individual Time Calculation (min): 70 min   Patient has met 4 of 4 short term goals.  Pt is making steady progress towards LTG. Pt is limited my poor endurance, poor midline orientation, poor awareness of situation and short term memory, but has progressed to min assist overall for bed mobility, Transfers with RW and gait with R up to 16f. Can propell WC with supervision assist and increased time for safety problem solving delays.   Patient continues to demonstrate the following deficits muscle weakness and muscle joint tightness, decreased cardiorespiratoy endurance, impaired timing and sequencing, unbalanced muscle activation and decreased coordination, decreased visual acuity, decreased visual perceptual skills, decreased visual motor skills and field cut, decreased attention to right, decreased attention, decreased awareness, decreased problem solving, decreased safety awareness, decreased memory and delayed processing and decreased sitting balance, decreased standing balance, decreased postural control, hemiplegia and decreased balance strategies and therefore will continue to benefit from skilled PT intervention to increase functional independence with mobility.  Patient progressing toward long term goals..  Continue plan of care.  PT Short Term Goals Week 2:  PT Short Term Goal 1 (Week 2): Pt will maintain standing balance with min assist up to 5 minutes while engaged in functional task PT Short Term Goal 1 - Progress (Week 2): Met PT Short Term Goal 2 (Week 2): Pt will ambulate 570fwith min assist and LRAD PT Short Term Goal 2 - Progress (Week 2): Met PT Short Term Goal 3 (Week 2): [facility] and LRAD consistently PT Short Term Goal 3 - Progress (Week 2): Met PT Short Term Goal 4 (Week 2): Pt will maintian sitting balance with supervision assist PT Short Term Goal 4 - Progress (Week 2): Met Week 3:  PT Short Term Goal 1 (Week 3): STG=LTG due to ELOS  Skilled Therapeutic Interventions/Progress Updates:   Pt received supine in bed and agreeable to PT. Supine>sit transfer with min assist and min cues for midline orientation to come to sitting upright EOB. Once, pt able to achieve midline, supervision assist sitting balance EOB x 35m70mues.   Stand pivot transfer to [facility] and min cues for increased BOS to prevent R Lateral LOB.   Standing balance while engaged in lateral R  And L followed by bean bag toss to target. Min-mod assist from PT for awareness of lateral LOB as well as moderate cues for proper LE placement and sustained R LE knee extension to correct LOB. Pt also performed foot tap on 3inch step(x 5BLE) and 6inch step(2x 6BLE)  with BUE support on stair rails. Moderate cues for stance width on the RLE. Min assist overall from PT to prevent R LOB.   WC mobility x [id number] supervision assist and min cues for symmetry of movement and attention to task throughout.   Gait training with RW 2 x 25ft40fh min assist from PT for safety and moderate cues for step width on the RLE as well as improved terminal knee extension. Noted improvement following instruction from PT.   Pt returned to room and performed stand pivot transfer to bed with RW and Min assist. Sit>supine completed with supervision assist, and left supine in bed with call  bell in reach and all needs met.        Therapy Documentation Precautions:  Precautions Precautions: Fall Restrictions Weight Bearing Restrictions: No    Vital Signs: Therapy Vitals Temp: 98.2 F (36.8 C) Pulse Rate: 82 Resp: 16 BP: (!) 143/58 Patient Position (if appropriate): Lying Oxygen Therapy SpO2: 100  % O2 Device: Room Air Pain: Pain Assessment Pain Scale: Faces Faces Pain Scale: No hurt   Therapy/Group: Individual Therapy  Lorie Phenix 04/03/2019, 2:16 PM

## 2019-04-03 NOTE — Progress Notes (Addendum)
[facility] PHYSICAL MEDICINE & REHABILITATION PROGRESS NOTE   Subjective/Complaints:  Appreciate Nephro note, No abd pain, discussed colonscopy which pt states was in October 2020- E chart confirms  Pt on ASA , Plavix , gets Heparin with HD  ROS: Patient denies nausea, vomiting, diarrhea, cough, shortness of breath or chest pain, joint or back pain, headache, or mood change.    Objective:   DG Swallowing Func-Speech Pathology  Result Date: 04/02/2019 Objective Swallowing Evaluation: Type of Study: MBS-Modified Barium Swallow Study  Patient Details Name: Kyle [name] MRN: [id number] Date of Birth: January 22, 1951 Today's Date: 04/02/2019 Time: SLP Start Time (ACUTE ONLY): 5732 -SLP Stop Time (ACUTE ONLY): 2025 SLP Time Calculation (min) (ACUTE ONLY): 24 min Past Medical History: Past Medical History: Diagnosis Date . Anxiety  . BPH (benign prostatic hyperplasia)  . Chronic kidney disease  . Colon polyps  . COPD (chronic obstructive pulmonary disease) ([place])  . Depression  . Diabetes mellitus without complication ([place])  . Diverticulosis of colon  . Hyperlipidemia  . Hypertension  . Hypothyroidism  . Nephrolithiasis  . Seizures ([place])  Past Surgical History: Past Surgical History: Procedure Laterality Date . AV FISTULA PLACEMENT Left 03/30/2019  Procedure: right brachiocephalic fistula creation;  Surgeon: Marty Heck, MD;  Location: [place];  Service: Vascular;  Laterality: Left; . COLONOSCOPY   . COLONOSCOPY WITH PROPOFOL N/A 05/30/2015  Procedure: COLONOSCOPY WITH PROPOFOL;  Surgeon: Manya Silvas, MD;  Location: [facility] ENDOSCOPY;  Service: Endoscopy;  Laterality: N/A; . COLONOSCOPY WITH PROPOFOL N/A 12/01/2018  Procedure: COLONOSCOPY WITH PROPOFOL;  Surgeon: Toledo, [name], MD;  Location: [facility] ENDOSCOPY;  Service: Gastroenterology;  Laterality: N/A; . INSERTION OF DIALYSIS CATHETER Right 03/30/2019  Procedure: Ultrasound guided right internal jugular tunneled dialysis catheter placement;  Surgeon:  Marty Heck, MD;  Location: [facility] OR;  Service: Vascular;  Laterality: Right; . kidney stone   . THYROIDECTOMY   . VARICOCELE EXCISION   . VIDEO BRONCHOSCOPY Bilateral 05/25/2016  Procedure: VIDEO BRONCHOSCOPY WITHOUT FLUORO;  Surgeon: Juanito Doom, MD;  Location: [facility] ENDOSCOPY;  Service: Cardiopulmonary;  Laterality: Bilateral; HPI: Pt is a 69 y/o male admitted for hypocalcemia. Pt has PMH to include Multiple medical issues including ESRD on peritoneal dialysis daily but has "missed" sessions per pt report, COPD, HTN, and seizures, Tobacco and ETOH use, Lung R Nodule, collapse of R lung. Pt currently receiving HHPT, however has been declining physically and reports multiple falls. 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 Chronic small-vessel ischemic changes elsewhere throughout the brain.  Prior to admission per chart notes, pt reports that he has been feeling very lightheaded and weak for about the past 24 hours.  He had an episode earlier today where he was eating at a restaurant and suddenly felt very shaky, " like I was going to have a seizure".  He reports his last seizure being over 5 years ago and did not end up having a seizure episode today, states he has been compliant with his Keppra.  He had an appointment with his GI doctor later in the day for chronic diarrhea and had lab work performed at that time.  He then received a call this evening that his Calcium was critically low.  Per NSG report this AM, pt was coughing when trying to swallow Pills.  Subjective: Patient alert, sitting up in bed, dysarthric speech Assessment / Plan / Recommendation CHL  IP CLINICAL IMPRESSIONS 04/02/2019 Clinical Impression Pt presents with mild pharyngeal dysphagia c/b decreased anterior hyoid movement. As such, pt has moderate amounts of vallecular residue with dysphagia 3. Liquid wash was helpful in clearing residue as pt  had better response to heavier bolus when dysphagia 3 and liquid were present with liquid wash. Whole pill was administered with just thin liquids and also whole in puree. With each, whole pill was difficult to clear from the vallecula and required multiple boluses of puree to clear. Medicine must be CRUSHED with puree. 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 Recommend pt continue dysphagia 3, thin liquids with medicine crushed in puree. SLP Visit Diagnosis Dysphagia, pharyngeal phase (R13.13) Attention and concentration deficit following -- Frontal lobe and executive function deficit following -- Impact on safety and function Mild aspiration risk   CHL IP TREATMENT RECOMMENDATION 04/02/2019 Treatment Recommendations Therapy as outlined in treatment plan below   Prognosis 03/17/2019 Prognosis for Safe Diet Advancement Fair Barriers to Reach Goals Cognitive deficits;Language deficits;Severity of deficits;Time post onset Barriers/Prognosis Comment -- CHL IP DIET RECOMMENDATION 04/02/2019 SLP Diet Recommendations Dysphagia 3 (Mech soft) solids;Thin liquid Liquid Administration via Cup;Straw Medication Administration Crushed with puree Compensations Minimize environmental distractions;Slow rate;Small sips/bites Postural Changes Seated upright at 90 degrees   CHL IP OTHER RECOMMENDATIONS 04/02/2019 Recommended Consults -- Oral Care Recommendations Oral care BID Other Recommendations --   CHL IP FOLLOW UP RECOMMENDATIONS 04/02/2019 Follow up Recommendations Skilled Nursing facility   [facility] IP FREQUENCY AND DURATION 03/18/2019 Speech Therapy Frequency (ACUTE ONLY) min 3x week Treatment Duration --      CHL IP ORAL PHASE 04/02/2019 Oral Phase WFL Oral - Pudding Teaspoon -- Oral - Pudding Cup -- Oral - Honey Teaspoon -- Oral - Honey Cup -- Oral - Nectar Teaspoon -- Oral - Nectar Cup -- Oral - Nectar Straw -- Oral - Thin Teaspoon -- Oral - Thin Cup -- Oral -  Thin Straw -- Oral - Puree -- Oral - Mech Soft -- Oral - Regular -- Oral - Multi-Consistency -- Oral - Pill -- Oral Phase - Comment --  CHL IP PHARYNGEAL PHASE 04/02/2019 Pharyngeal Phase -- Pharyngeal- Pudding Teaspoon -- Pharyngeal -- Pharyngeal- Pudding Cup -- Pharyngeal -- Pharyngeal- Honey Teaspoon -- Pharyngeal -- Pharyngeal- Honey Cup -- Pharyngeal -- Pharyngeal- Nectar Teaspoon -- Pharyngeal -- Pharyngeal- Nectar Cup -- Pharyngeal -- Pharyngeal- Nectar Straw -- Pharyngeal -- Pharyngeal- Thin Teaspoon -- Pharyngeal -- Pharyngeal- Thin Cup -- Pharyngeal -- Pharyngeal- Thin Straw -- Pharyngeal -- Pharyngeal- Puree Delayed swallow initiation-vallecula Pharyngeal -- Pharyngeal- Mechanical Soft Compensatory strategies attempted (with notebox);Pharyngeal residue - valleculae Pharyngeal -- Pharyngeal- Regular -- Pharyngeal -- Pharyngeal- Multi-consistency -- Pharyngeal -- Pharyngeal- Pill Pharyngeal residue - valleculae;Other (Comment);Compensatory strategies attempted (with notebox) Pharyngeal -- Pharyngeal Comment --  CHL IP CERVICAL ESOPHAGEAL PHASE 04/02/2019 Cervical Esophageal Phase WFL Pudding Teaspoon -- Pudding Cup -- Honey Teaspoon -- Honey Cup -- Nectar Teaspoon -- Nectar Cup -- Nectar Straw -- Thin Teaspoon -- Thin Cup -- Thin Straw -- Puree -- Mechanical Soft -- Regular -- Multi-consistency -- Pill -- Cervical Esophageal Comment -- Happi Overton 04/02/2019, 2:28 PM              Recent Labs    04/01/19 0708 04/02/19 0629  WBC 8.7 7.8  HGB 7.1* 7.2*  HCT 21.6* 21.9*  PLT 195 201   Recent Labs    04/01/19 0708 04/02/19 0525  NA 136  137  K 3.9 4.3  CL 97* 103  CO2 25 22  GLUCOSE 140* 155*  BUN 34* 42*  CREATININE 6.46* 8.04*  CALCIUM 7.4* 7.7*    Intake/Output Summary (Last 24 hours) at 04/03/2019 0900 Last data filed at 04/03/2019 0853 Gross per 24 hour  Intake 680 ml  Output 2061 ml  Net -1381 ml     Physical Exam: Vital Signs Blood pressure (!) 123/53, pulse 72, temperature 98.3  F (36.8 C), resp. rate 18, height 6' (1.829 m), weight 95 kg, SpO2 99 %. Constitutional: No distress . Vital signs reviewed. HEENT: EOMI, oral membranes moist.Ptosis ~50%  Neck: supple Cardiovascular: RRR without murmur. No JVD    Respiratory: CTA Bilaterally without wheezes or rales. Normal effort    GI: BS +, non-tender, non-distended   Musculoskeletal:  General: Normal range of motion.  no pain with ROM   Right forearm with swelling, tender to palpation, along distal radial aspect.  Neurological: lethargic. Followed basic commands ie pick up R foot, pick up left foot, did not squeeze to command .c. Left CN III palsy with ptosiscould not do full MMT due to lethargy Skin: Skin iswarm.  Psychiatric: flat  Assessment/Plan: 1. 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  Physiatrist is providing close team supervision and 24 hour management of active medical problems listed below.  Physiatrist and rehab team continue to assess barriers to discharge/monitor patient progress toward functional and medical goals  Care Tool:  Bathing  Bathing activity did not occur: Refused Body parts bathed by patient: Right arm, Left arm, Chest, Abdomen, Front perineal area, Right upper leg, Left upper leg, Face, Buttocks, Right lower leg, Left lower leg   Body parts bathed by helper: Buttocks, Right lower leg, Left lower leg     Bathing assist Assist Level: Minimal Assistance - Patient > 75%     Upper Body Dressing/Undressing Upper body dressing   What is the patient wearing?: Pull over shirt    Upper body assist Assist Level: Supervision/Verbal cueing    Lower Body Dressing/Undressing Lower body dressing      What is the patient wearing?: Incontinence brief, Pants     Lower body assist Assist for lower body dressing: Moderate Assistance - Patient 50 -  74%     Toileting Toileting Toileting Activity did not occur Landscape architect and hygiene only): N/A (no void or bm)  Toileting assist Assist for toileting: Minimal Assistance - Patient > 75%     Transfers Chair/bed transfer  Transfers assist  Chair/bed transfer activity did not occur: Safety/medical concerns  Chair/bed transfer assist level: Minimal Assistance - Patient > 75%     Locomotion Ambulation   Ambulation assist      Assist level: Moderate Assistance - Patient 50 - 74% Assistive device: Walker-rolling Max distance: 25   Walk 10 feet activity   Assist     Assist level: Moderate Assistance - Patient - 50 - 74% Assistive device: Walker-rolling   Walk 50 feet activity   Assist Walk 50 feet with 2 turns activity did not occur: Safety/medical concerns  Assist level: Moderate Assistance - Patient - 50 - 74% Assistive device: No Device, Hand held assist    Walk 150 feet activity   Assist Walk 150 feet activity did not occur: Safety/medical concerns         Walk 10 feet on uneven surface  activity  Assist Walk 10 feet on uneven surfaces activity did not occur: Safety/medical concerns(Per report)         Wheelchair     Assist   Type of Wheelchair: Manual    Wheelchair assist level: Minimal Assistance - Patient > 75% Max wheelchair distance: 192ft    Wheelchair 50 feet with 2 turns activity    Assist        Assist Level: Minimal Assistance - Patient > 75%   Wheelchair 150 feet activity     Assist      Assist Level: Moderate Assistance - Patient 50 - 74%   Blood pressure (!) 123/53, pulse 72, temperature 98.3 F (36.8 C), resp. rate 18, height 6' (1.829 m), weight 95 kg, SpO2 99 %.  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 Antithrombotics: -DVT/anticoagulation:Pharmaceutical:Heparin -antiplatelet therapy: ASA/PLAVIX X 3, due to heme + stool , will d/c Clopidgrel 2 d early  weeks followed by ASA alone. 3. Pain Management:tylenol prn 4. Mood:LCSW to follow for evaluation and support. -antipsychotic agents: N/A 5. Neuropsych: This patientiscapable of making decisions on hisown behalf. 6. Skin/Wound Care:Routine pressure relief measures.  2/4: Has area of swelling that is tender to palpation along right distal radial aspect of forearm. Placed general nursing order for ice administration to reduce pain and swelling.  7. Fluids/Electrolytes/Nutrition:Monitor I/O. Has been refusing nectar liquids. Push PO  2/4: Electrolytes personally reviewed this morning and stable with exception of rising Creatinine.  8. T2DM with neuropathy/nephropathy: Hgb A1c- Was on ozempic, Lantus, Humalog and glucotrol PTA.            CBG (last 3)  Recent Labs    04/02/19 1706 04/02/19 2127 04/03/19 0634  GLUCAP 141* 169* 147*  fair control 2/3  9. ESRD- on PD at nights.per nephro convert to HD due to SNF placement,Has IJ, received PD last noc.  2/4: Personally reviewed labs today. Creatinine increased to 8.04 from 6.46. Has first HD today; appreciate Nephrology notes. Discussed plan of care with patient and answered his questions.      10. Hypokalemia/Hypomagnesemia: supps per nephro. - nephro ordered KCL 79meq TID 11. Anemia of chronic disease: On aranesp weekly. 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 Chronic diarrhea:Continue creon tid with meals.   -no diarrhea currently 14.HTN: Monitor BP tid--on metoprolol, losartan and low dose amlodipine             1/24 fair control  1/30: labile  1/31: well controlled.  15. Chronic  dizziness/Orthostatic changes?:  no dizziness reported   -will check orthostatic VS x 2 days 16. Prolonged QTC: QTC 528 on EKG 1/14 17. Lethargy: improved , post op 32.  Dysphagia - stroke no sign of aspiration     LOS: 14 days A FACE TO FACE EVALUATION WAS PERFORMED  [name] 04/03/2019, 9:00 AM

## 2019-04-03 NOTE — Progress Notes (Signed)
Speech Language Pathology Daily Session Note  Patient Details  Name: Kyle [name] MRN: [id number] Date of Birth: October 27, 1950  Today's Date: 04/03/2019 SLP Individual Time: 1000-1059 SLP Individual Time Calculation (min): 59 min  Short Term Goals: Week 3: SLP Short Term Goal 1 (Week 3): Pt will complete pharyngeal exercises targeting anterior hyoid movement with Min A cues. SLP Short Term Goal 2 (Week 3): Pt will  use speech intelligibility strategies (over articulation and slow rate) at sentence level for 90% intelligibility with Mod A verbal cues. SLP Short Term Goal 3 (Week 3): Pt will demonstrate sustained attention in 10 minute intervals with Mod A verbal cues for redirection during functional tasks. SLP Short Term Goal 4 (Week 3): Pt will answer yes/no quesitons to demonstrate intellectual awareness of cognition/communication/swallow deficits and physical deficits in 5 out of 10 opportunities with Mod A cues. SLP Short Term Goal 5 (Week 3): Pt will complete functional and basic problem solving tasks with Mod A verbal cues. SLP Short Term Goal 6 (Week 3): Pt will utilize external compensatory memory strategies to recall information in 5 out of 10 opportunities with Mod A cues.  Skilled Therapeutic Interventions: Pt was seen for skilled ST targeting dysphagia and cognitive goals. SLP facilitated session with introduction of CTAR pharyngeal strengthening exercise targeting anterior hyoid movement. Pt required Max A verbal and visual cues in order to demonstrate CTAR X10. Written handout with instructions for exercise and recommendation to complete 10 sets 3X daily left with pt at bedside. During a novel semi-complex card task, pt required Mod A for problem solving and Max A for recall of rules (Blink). He continues to demonstrate reduced mental flexibility, evidenced during card task, as he required cues to rationalize switching back and forth between matching cards by shape, color, and number.  Provided Min A question cues pt recalled completing another MBSS as well as new recommendations - pills crushed and no need for extra dry swallow during PO consumption now. Pt also with improved verbal recall of strategy to "enunciate" (or overarticulate) when speaking in order to increase intelligibility, however his implementation of said strategy was poor and overall intelligibility noted to be reduced (~75% conversation) in comparison to this therapist's previous encounter with pt. Pt answered yes/no questions with accuracy in regards to his current physical and cognitive deficits, indicative of some improvements in intellectual awareness. Pt left laying in bed with alarm set and needs within reach. Continue per current plan of care.        Pain Pain Assessment Pain Scale: Faces Pain Score: 0-No pain Faces Pain Scale: No hurt  Therapy/Group: Individual Therapy  Arbutus Leas 04/03/2019, 11:29 AM

## 2019-04-03 NOTE — Plan of Care (Signed)
  Problem: Consults Goal: RH STROKE PATIENT EDUCATION Description: See Patient Education module for education specifics  Outcome: Progressing Goal: Nutrition Consult-if indicated Outcome: Progressing Goal: Diabetes Guidelines if Diabetic/Glucose > 140 Description: If diabetic or lab glucose is > 140 mg/dl - Initiate Diabetes/Hyperglycemia Guidelines & Document Interventions  Outcome: Progressing   Problem: RH BOWEL ELIMINATION Goal: RH STG MANAGE BOWEL WITH ASSISTANCE Description: STG Manage Bowel with mod Assistance. Outcome: Progressing Goal: RH STG MANAGE BOWEL W/MEDICATION W/ASSISTANCE Description: STG Manage Bowel with Medication with mod Assistance. Outcome: Progressing   Problem: RH SKIN INTEGRITY Goal: RH STG SKIN FREE OF INFECTION/BREAKDOWN Description: Skin to remain free from infection and breakdown while on rehab with min assist. Outcome: Progressing Goal: RH STG MAINTAIN SKIN INTEGRITY WITH ASSISTANCE Description: STG Maintain Skin Integrity With min Assistance. Outcome: Progressing Goal: RH STG ABLE TO PERFORM INCISION/WOUND CARE W/ASSISTANCE Description: STG Able To Perform Wound Care With mod Assistance. Outcome: Progressing   Problem: RH SAFETY Goal: RH STG ADHERE TO SAFETY PRECAUTIONS W/ASSISTANCE/DEVICE Description: STG Adhere to Safety Precautions With mod Assistance and appropriate assistive Device. Outcome: Progressing   Problem: RH PAIN MANAGEMENT Goal: RH STG PAIN MANAGED AT OR BELOW PT'S PAIN GOAL Description: <3 on a 0-10 pain scale. Outcome: Progressing   Problem: RH KNOWLEDGE DEFICIT Goal: RH STG INCREASE KNOWLEDGE OF DIABETES Description: Patient will increase knowledge on DM medications, dietary restrictions, and follow-up care with the MD after discharge with min assist from staff. Outcome: Progressing Goal: RH STG INCREASE KNOWLEDGE OF HYPERTENSION Description: Patient will increase knowledge of HTN medications, dietary restrictions, and  follow-up care with the MD after discharge with min assist from staff. Outcome: Progressing Goal: RH STG INCREASE KNOWLEDGE OF DYSPHAGIA/FLUID INTAKE Description: Patient will increase and display knowledge of dysphagia diets and thickened liquids appropriate for his dietary restrictions with min assist from staff. Outcome: Progressing Goal: RH STG INCREASE KNOWLEGDE OF HYPERLIPIDEMIA Description: Patient will increase knowledge of HLD medications and follow-up care with the MD after discharge with min assist from staff. Outcome: Progressing Goal: RH STG INCREASE KNOWLEDGE OF STROKE PROPHYLAXIS Description: Patient will increase knowledge of medications used to prevent future strokes and follow-up care with the MD after discharge with min assist from staff. Outcome: Progressing

## 2019-04-03 NOTE — Progress Notes (Signed)
[facility] NEPHROLOGY PROGRESS NOTE  Assessment/ Plan: Pt is a 69 y.o. yo male ESRD was on PD transition to HD on 2/2.  Status post a stroke and plan to go to SNF.  # ESRD transitioned from PD to HD as the plan is to go to SNF. -Post HD yesterday.  Plan for next dialysis tomorrow. He needs arrangement for outpatient HD.   Status post RIJ TDC and left BC AVF placed by Dr. Carlis Abbott 2/1.  # Anemia of CKD ? Blood loss: Received IV iron.  Changing subcu darbepoetin to IV during dialysis.  Monitor hemoglobin.  Transfuse as needed.  # Secondary hyperparathyroidism: Phosphorus is low.  I will discontinue binders.  # HTN/volume monitor blood pressure.  Continue metoprolol, amlodipine.  #Stroke: Undergoing rehabilitation.   Subjective: Seen and examined at bedside.  Is doing well.  Tolerating hemodialysis.  Denied nausea vomiting chest pain shortness of breath. Objective Vital signs in last 24 hours: Vitals:   04/02/19 1630 04/02/19 1638 04/02/19 1945 04/03/19 0555  BP: (!) 159/69 138/66 127/72 (!) 123/53  Pulse: 78 78 80 72  Resp:  18 18 18   Temp:  98.2 F (36.8 C) 98.3 F (36.8 C) 98.3 F (36.8 C)  TempSrc:  Oral    SpO2:  98% 100% 99%  Weight:  95 kg    Height:       Weight change: 0.2 kg  Intake/Output Summary (Last 24 hours) at 04/03/2019 1127 Last data filed at 04/03/2019 0853 Gross per 24 hour  Intake 680 ml  Output 2059 ml  Net -1379 ml       Labs: Basic Metabolic Panel: Recent Labs  Lab 04/01/19 0708 04/02/19 0525 04/03/19 0755  NA 136 137 139  K 3.9 4.3 4.1  CL 97* 103 101  CO2 25 22 28   GLUCOSE 140* 155* 159*  BUN 34* 42* 30*  CREATININE 6.46* 8.04* 5.32*  CALCIUM 7.4* 7.7* 7.8*  PHOS 3.4 3.7 2.3*   Liver Function Tests: Recent Labs  Lab 04/01/19 0708 04/02/19 0525 04/03/19 0755  ALBUMIN 1.8* 1.9* 2.0*   No results for input(s): LIPASE, AMYLASE in the last 168 hours. No results for input(s): AMMONIA in the last 168  hours. CBC: Recent Labs  Lab 03/30/19 1348 03/30/19 1348 03/30/19 1829 03/30/19 1829 03/31/19 0541 04/01/19 0708 04/02/19 0629  WBC 12.8*   < > 12.5*   < > 13.2* 8.7 7.8  HGB 7.5*   < > 7.6*   < > 6.7* 7.1* 7.2*  HCT 23.2*   < > 23.1*   < > 20.3* 21.6* 21.9*  MCV 103.1*  --  101.3*  --  101.5* 101.4* 100.5*  PLT 229   < > 236   < > 225 195 201   < > = values in this interval not displayed.   Cardiac Enzymes: No results for input(s): CKTOTAL, CKMB, CKMBINDEX, TROPONINI in the last 168 hours. CBG: Recent Labs  Lab 04/02/19 0602 04/02/19 1205 04/02/19 1706 04/02/19 2127 04/03/19 0634  GLUCAP 147* 319* 141* 169* 147*    Iron Studies: No results for input(s): IRON, TIBC, TRANSFERRIN, FERRITIN in the last 72 hours. Studies/Results: DG Swallowing Func-Speech Pathology  Result Date: 04/02/2019 Objective Swallowing Evaluation: Type of Study: MBS-Modified Barium Swallow Study  Patient Details Name: Joshva Labreck MRN: [id number] Date of Birth: 02-28-1950 Today's Date: 04/02/2019 Time: SLP Start Time (ACUTE ONLY): 4098 -SLP Stop Time (ACUTE ONLY): 1191 SLP Time Calculation (min) (ACUTE ONLY): 24 min Past Medical  History: Past Medical History: Diagnosis Date . Anxiety  . BPH (benign prostatic hyperplasia)  . Chronic kidney disease  . Colon polyps  . COPD (chronic obstructive pulmonary disease) ([place])  . Depression  . Diabetes mellitus without complication ([place])  . Diverticulosis of colon  . Hyperlipidemia  . Hypertension  . Hypothyroidism  . Nephrolithiasis  . Seizures ([place])  Past Surgical History: Past Surgical History: Procedure Laterality Date . AV FISTULA PLACEMENT Left 03/30/2019  Procedure: right brachiocephalic fistula creation;  Surgeon: Marty Heck, MD;  Location: [facility];  Service: Vascular;  Laterality: Left; . COLONOSCOPY   . COLONOSCOPY WITH PROPOFOL N/A 05/30/2015  Procedure: COLONOSCOPY WITH PROPOFOL;  Surgeon: Manya Silvas, MD;  Location: [facility] ENDOSCOPY;  Service:  Endoscopy;  Laterality: N/A; . COLONOSCOPY WITH PROPOFOL N/A 12/01/2018  Procedure: COLONOSCOPY WITH PROPOFOL;  Surgeon: Toledo, [name], MD;  Location: [facility] ENDOSCOPY;  Service: Gastroenterology;  Laterality: N/A; . INSERTION OF DIALYSIS CATHETER Right 03/30/2019  Procedure: Ultrasound guided right internal jugular tunneled dialysis catheter placement;  Surgeon: Marty Heck, MD;  Location[facility] OR;  Service: Vascular;  Laterality: Right; . kidney stone   . THYROIDECTOMY   . VARICOCELE EXCISION   . VIDEO BRONCHOSCOPY Bilateral 05/25/2016  Procedure: VIDEO BRONCHOSCOPY WITHOUT FLUORO;  Surgeon: Juanito Doom, MD;  Location: [facility] ENDOSCOPY;  Service: Cardiopulmonary;  Laterality: Bilateral; HPI: Pt is a 69 y/o male admitted for hypocalcemia. Pt has PMH to include Multiple medical issues including ESRD on peritoneal dialysis daily but has "missed" sessions per pt report, COPD, HTN, and seizures, Tobacco and ETOH use, Lung R Nodule, collapse of R lung. Pt currently receiving HHPT, however has been declining physically and reports multiple falls. 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 Chronic small-vessel ischemic changes elsewhere throughout the brain.  Prior to admission per chart notes, pt reports that he has been feeling very lightheaded and weak for about the past 24 hours.  He had an episode earlier today where he was eating at a restaurant and suddenly felt very shaky, " like I was going to have a seizure".  He reports his last seizure being over 5 years ago and did not end up having a seizure episode today, states he has been compliant with his Keppra.  He had an appointment with his GI doctor later in the day for chronic diarrhea and had lab work performed at that time.  He then received a call this evening that his Calcium was critically low.  Per NSG report this AM, pt was coughing when trying to swallow  Pills.  Subjective: Patient alert, sitting up in bed, dysarthric speech Assessment / Plan / Recommendation CHL IP CLINICAL IMPRESSIONS 04/02/2019 Clinical Impression Pt presents with mild pharyngeal dysphagia c/b decreased anterior hyoid movement. As such, pt has moderate amounts of vallecular residue with dysphagia 3. Liquid wash was helpful in clearing residue as pt had better response to heavier bolus when dysphagia 3 and liquid were present with liquid wash. Whole pill was administered with just thin liquids and also whole in puree. With each, whole pill was difficult to clear from the vallecula and required multiple boluses of puree to clear. Medicine must be CRUSHED with puree. 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 Recommend pt continue dysphagia 3, thin liquids with medicine crushed in puree. SLP  Visit Diagnosis Dysphagia, pharyngeal phase (R13.13) Attention and concentration deficit following -- Frontal lobe and executive function deficit following -- Impact on safety and function Mild aspiration risk   CHL IP TREATMENT RECOMMENDATION 04/02/2019 Treatment Recommendations Therapy as outlined in treatment plan below   Prognosis 03/17/2019 Prognosis for Safe Diet Advancement Fair Barriers to Reach Goals Cognitive deficits;Language deficits;Severity of deficits;Time post onset Barriers/Prognosis Comment -- CHL IP DIET RECOMMENDATION 04/02/2019 SLP Diet Recommendations Dysphagia 3 (Mech soft) solids;Thin liquid Liquid Administration via Cup;Straw Medication Administration Crushed with puree Compensations Minimize environmental distractions;Slow rate;Small sips/bites Postural Changes Seated upright at 90 degrees   CHL IP OTHER RECOMMENDATIONS 04/02/2019 Recommended Consults -- Oral Care Recommendations Oral care BID Other Recommendations --   CHL IP FOLLOW UP RECOMMENDATIONS 04/02/2019 Follow up Recommendations Skilled Nursing facility   [facility] IP  FREQUENCY AND DURATION 03/18/2019 Speech Therapy Frequency (ACUTE ONLY) min 3x week Treatment Duration --      CHL IP ORAL PHASE 04/02/2019 Oral Phase WFL Oral - Pudding Teaspoon -- Oral - Pudding Cup -- Oral - Honey Teaspoon -- Oral - Honey Cup -- Oral - Nectar Teaspoon -- Oral - Nectar Cup -- Oral - Nectar Straw -- Oral - Thin Teaspoon -- Oral - Thin Cup -- Oral - Thin Straw -- Oral - Puree -- Oral - Mech Soft -- Oral - Regular -- Oral - Multi-Consistency -- Oral - Pill -- Oral Phase - Comment --  CHL IP PHARYNGEAL PHASE 04/02/2019 Pharyngeal Phase -- Pharyngeal- Pudding Teaspoon -- Pharyngeal -- Pharyngeal- Pudding Cup -- Pharyngeal -- Pharyngeal- Honey Teaspoon -- Pharyngeal -- Pharyngeal- Honey Cup -- Pharyngeal -- Pharyngeal- Nectar Teaspoon -- Pharyngeal -- Pharyngeal- Nectar Cup -- Pharyngeal -- Pharyngeal- Nectar Straw -- Pharyngeal -- Pharyngeal- Thin Teaspoon -- Pharyngeal -- Pharyngeal- Thin Cup -- Pharyngeal -- Pharyngeal- Thin Straw -- Pharyngeal -- Pharyngeal- Puree Delayed swallow initiation-vallecula Pharyngeal -- Pharyngeal- Mechanical Soft Compensatory strategies attempted (with notebox);Pharyngeal residue - valleculae Pharyngeal -- Pharyngeal- Regular -- Pharyngeal -- Pharyngeal- Multi-consistency -- Pharyngeal -- Pharyngeal- Pill Pharyngeal residue - valleculae;Other (Comment);Compensatory strategies attempted (with notebox) Pharyngeal -- Pharyngeal Comment --  CHL IP CERVICAL ESOPHAGEAL PHASE 04/02/2019 Cervical Esophageal Phase WFL Pudding Teaspoon -- Pudding Cup -- Honey Teaspoon -- Honey Cup -- Nectar Teaspoon -- Nectar Cup -- Nectar Straw -- Thin Teaspoon -- Thin Cup -- Thin Straw -- Puree -- Mechanical Soft -- Regular -- Multi-consistency -- Pill -- Cervical Esophageal Comment -- Happi Overton 04/02/2019, 2:28 PM               Medications: Infusions:   Scheduled Medications: . sodium chloride   Intravenous Once  . amLODipine  5 mg Oral Daily  . aspirin EC  81 mg Oral Daily  .  atorvastatin  80 mg Oral Daily  . calcium acetate  1,334 mg Oral TID WC  . Chlorhexidine Gluconate Cloth  6 each Topical Daily  . Chlorhexidine Gluconate Cloth  6 each Topical Q0600  . darbepoetin (ARANESP) injection - NON-DIALYSIS  25 mcg Subcutaneous Q Sun-1800  . feeding supplement (NEPRO CARB STEADY)  237 mL Oral BID BM  . gabapentin  100 mg Oral BID  . insulin aspart  0-15 Units Subcutaneous TID WC  . insulin glargine  30 Units Subcutaneous Daily  . levETIRAcetam  500 mg Oral BID  . levothyroxine  200 mcg Oral q morning - 10a  . lipase/protease/amylase  24,000 Units Oral TID WC  . metoprolol succinate  50 mg Oral Daily  . modafinil  100  mg Oral Daily  . multivitamin  1 tablet Oral Daily    have reviewed scheduled and prn medications.  Physical Exam: General:NAD, comfortable Heart:RRR, s1s2 nl Lungs:clear b/l, no crackle Abdomen:soft, Non-tender, non-distended.  Has a PD catheter. Extremities:No edema Dialysis Access: RIJ TDC and left AV fistula  Shawon Denzer Tanna Furry 04/03/2019,11:27 AM  LOS: 14 days  Pager: [phone]

## 2019-04-03 NOTE — Progress Notes (Signed)
Occupational Therapy Session Note  Patient Details  Name: Kyle [name] MRN: [id number] Date of Birth: April 06, 1950  Today's Date: 04/03/2019 OT Individual Time: 4680-3212 OT Individual Time Calculation (min): 56 min   Short Term Goals: Week 2:  OT Short Term Goal 1 (Week 2): STGs=LTGs due to ELOS    Skilled Therapeutic Interventions/Progress Updates:    Pt greeted in bed, finishing up breakfast. Asking OT what day it was and also when he was going home. Pt oriented to month and year but needed assistance to recall the day of the week. We discussed his d/c plan of SNF and that there is no set date at this time as to when he is going home. He reported that he needed to use the restroom but wanted to wait until after he was finished eating to do so. Supine<sit completed with Min A and increased time, vcs for sustained attention to task as pt becomes externally distracted and loses his balance backwards. Min A stand pivot<w/c using RW and also to toilet using grab bar. After a few minutes pt unable to have a continent void. Close supervision for sitting balance as pt has a habit of crossing his legs and increasing Rt lean while doing so. He then returned to the w/c and completed dressing tasks sit<stand at the sink after handwashing. Note that pt picked up his toothbrush and began brushing his teeth without toothpaste, poor dual task processing when he begins a conversation. Needs A for redirection to task. Min A for sit<stand and dynamic standing balance during LB self care including perihygiene and LB dressing. Pt impulsively sits down when he's tired due to limited standing endurance. It took 2 stands for him to fully elevate pants over hips. Vcs for recall of hemi strategies. Assist needed for donning gripper socks as well with pt utilizing figure 4 position on his own. Once he donned his shirt, pt reported needing to use the toilet again. He completed another toilet transfer in the same manner as  written above. Once pt lowered clothing and sat down he quickly began having a BM. Pt left in care of NT to finish BM and complete hygiene safely. Tx focus placed on ADL retraining, NMR, balance, functional cognition, standing balance, and standing endurance.       Therapy Documentation Precautions:  Precautions Precautions: Fall Restrictions Weight Bearing Restrictions: No Pain: in Rt forearm due to protruding vein, provided ice pack and notified NT where to place ice pack after toileting and to remove after 15 minutes Pain Assessment Pain Scale: Faces Pain Score: 0-No pain Faces Pain Scale: No hurt ADL: ADL Eating: Not assessed Grooming: Maximal assistance Where Assessed-Grooming: Edge of bed Upper Body Bathing: Minimal assistance Where Assessed-Upper Body Bathing: Edge of bed Lower Body Bathing: Maximal assistance Where Assessed-Lower Body Bathing: Edge of bed Upper Body Dressing: Moderate assistance Where Assessed-Upper Body Dressing: Edge of bed Lower Body Dressing: Maximal assistance Where Assessed-Lower Body Dressing: Edge of bed Toileting: Not assessed Toilet Transfer: Not assessed Tub/Shower Transfer: Not assessed      Therapy/Group: Individual Therapy  Rivka Baune A Alroy Portela 04/03/2019, 12:31 PM

## 2019-04-04 ENCOUNTER — Inpatient Hospital Stay (HOSPITAL_COMMUNITY): Payer: Medicare PPO | Admitting: Physical Therapy

## 2019-04-04 ENCOUNTER — Inpatient Hospital Stay (HOSPITAL_COMMUNITY)
Admission: AD | Admit: 2019-04-04 | Discharge: 2019-04-16 | DRG: 377 | Disposition: A | Discharge status: Skilled Nursing Facility | Payer: Medicare PPO | Source: Ambulatory Visit | Attending: Internal Medicine | Admitting: Internal Medicine

## 2019-04-04 DIAGNOSIS — Z20822 Contact with and (suspected) exposure to covid-19: Secondary | ICD-10-CM | POA: Diagnosis present

## 2019-04-04 DIAGNOSIS — R0602 Shortness of breath: Secondary | ICD-10-CM

## 2019-04-04 DIAGNOSIS — J449 Chronic obstructive pulmonary disease, unspecified: Secondary | ICD-10-CM | POA: Diagnosis not present

## 2019-04-04 DIAGNOSIS — Z794 Long term (current) use of insulin: Secondary | ICD-10-CM

## 2019-04-04 DIAGNOSIS — Z825 Family history of asthma and other chronic lower respiratory diseases: Secondary | ICD-10-CM

## 2019-04-04 DIAGNOSIS — E785 Hyperlipidemia, unspecified: Secondary | ICD-10-CM | POA: Diagnosis present

## 2019-04-04 DIAGNOSIS — Z008 Encounter for other general examination: Secondary | ICD-10-CM | POA: Diagnosis not present

## 2019-04-04 DIAGNOSIS — D175 Benign lipomatous neoplasm of intra-abdominal organs: Secondary | ICD-10-CM | POA: Diagnosis present

## 2019-04-04 DIAGNOSIS — K2970 Gastritis, unspecified, without bleeding: Secondary | ICD-10-CM | POA: Diagnosis present

## 2019-04-04 DIAGNOSIS — N400 Enlarged prostate without lower urinary tract symptoms: Secondary | ICD-10-CM | POA: Diagnosis present

## 2019-04-04 DIAGNOSIS — G9341 Metabolic encephalopathy: Secondary | ICD-10-CM | POA: Diagnosis present

## 2019-04-04 DIAGNOSIS — N186 End stage renal disease: Secondary | ICD-10-CM | POA: Diagnosis present

## 2019-04-04 DIAGNOSIS — I6350 Cerebral infarction due to unspecified occlusion or stenosis of unspecified cerebral artery: Secondary | ICD-10-CM | POA: Diagnosis present

## 2019-04-04 DIAGNOSIS — F17210 Nicotine dependence, cigarettes, uncomplicated: Secondary | ICD-10-CM | POA: Diagnosis present

## 2019-04-04 DIAGNOSIS — I959 Hypotension, unspecified: Secondary | ICD-10-CM | POA: Diagnosis present

## 2019-04-04 DIAGNOSIS — N184 Chronic kidney disease, stage 4 (severe): Secondary | ICD-10-CM

## 2019-04-04 DIAGNOSIS — H02402 Unspecified ptosis of left eyelid: Secondary | ICD-10-CM | POA: Diagnosis not present

## 2019-04-04 DIAGNOSIS — G4700 Insomnia, unspecified: Secondary | ICD-10-CM | POA: Diagnosis not present

## 2019-04-04 DIAGNOSIS — G40909 Epilepsy, unspecified, not intractable, without status epilepticus: Secondary | ICD-10-CM | POA: Diagnosis present

## 2019-04-04 DIAGNOSIS — Z992 Dependence on renal dialysis: Secondary | ICD-10-CM | POA: Diagnosis not present

## 2019-04-04 DIAGNOSIS — I4510 Unspecified right bundle-branch block: Secondary | ICD-10-CM | POA: Diagnosis present

## 2019-04-04 DIAGNOSIS — K922 Gastrointestinal hemorrhage, unspecified: Secondary | ICD-10-CM | POA: Diagnosis present

## 2019-04-04 DIAGNOSIS — K920 Hematemesis: Secondary | ICD-10-CM | POA: Diagnosis present

## 2019-04-04 DIAGNOSIS — R4182 Altered mental status, unspecified: Secondary | ICD-10-CM | POA: Diagnosis not present

## 2019-04-04 DIAGNOSIS — J438 Other emphysema: Secondary | ICD-10-CM | POA: Diagnosis not present

## 2019-04-04 DIAGNOSIS — G4733 Obstructive sleep apnea (adult) (pediatric): Secondary | ICD-10-CM | POA: Diagnosis present

## 2019-04-04 DIAGNOSIS — D62 Acute posthemorrhagic anemia: Secondary | ICD-10-CM | POA: Diagnosis not present

## 2019-04-04 DIAGNOSIS — K5520 Angiodysplasia of colon without hemorrhage: Secondary | ICD-10-CM | POA: Diagnosis not present

## 2019-04-04 DIAGNOSIS — Y848 Other medical procedures as the cause of abnormal reaction of the patient, or of later complication, without mention of misadventure at the time of the procedure: Secondary | ICD-10-CM | POA: Diagnosis not present

## 2019-04-04 DIAGNOSIS — K2980 Duodenitis without bleeding: Secondary | ICD-10-CM | POA: Diagnosis present

## 2019-04-04 DIAGNOSIS — K5730 Diverticulosis of large intestine without perforation or abscess without bleeding: Secondary | ICD-10-CM | POA: Diagnosis present

## 2019-04-04 DIAGNOSIS — Z833 Family history of diabetes mellitus: Secondary | ICD-10-CM

## 2019-04-04 DIAGNOSIS — K921 Melena: Secondary | ICD-10-CM | POA: Diagnosis not present

## 2019-04-04 DIAGNOSIS — E1151 Type 2 diabetes mellitus with diabetic peripheral angiopathy without gangrene: Secondary | ICD-10-CM | POA: Diagnosis present

## 2019-04-04 DIAGNOSIS — E1322 Other specified diabetes mellitus with diabetic chronic kidney disease: Secondary | ICD-10-CM | POA: Diagnosis not present

## 2019-04-04 DIAGNOSIS — F419 Anxiety disorder, unspecified: Secondary | ICD-10-CM | POA: Diagnosis present

## 2019-04-04 DIAGNOSIS — D696 Thrombocytopenia, unspecified: Secondary | ICD-10-CM | POA: Diagnosis not present

## 2019-04-04 DIAGNOSIS — Z8719 Personal history of other diseases of the digestive system: Secondary | ICD-10-CM

## 2019-04-04 DIAGNOSIS — D631 Anemia in chronic kidney disease: Secondary | ICD-10-CM | POA: Diagnosis present

## 2019-04-04 DIAGNOSIS — R569 Unspecified convulsions: Secondary | ICD-10-CM | POA: Diagnosis not present

## 2019-04-04 DIAGNOSIS — E1122 Type 2 diabetes mellitus with diabetic chronic kidney disease: Secondary | ICD-10-CM | POA: Diagnosis present

## 2019-04-04 DIAGNOSIS — F05 Delirium due to known physiological condition: Secondary | ICD-10-CM | POA: Diagnosis not present

## 2019-04-04 DIAGNOSIS — Z7902 Long term (current) use of antithrombotics/antiplatelets: Secondary | ICD-10-CM

## 2019-04-04 DIAGNOSIS — J9589 Other postprocedural complications and disorders of respiratory system, not elsewhere classified: Secondary | ICD-10-CM | POA: Diagnosis not present

## 2019-04-04 DIAGNOSIS — J42 Unspecified chronic bronchitis: Secondary | ICD-10-CM | POA: Diagnosis not present

## 2019-04-04 DIAGNOSIS — E876 Hypokalemia: Secondary | ICD-10-CM | POA: Diagnosis not present

## 2019-04-04 DIAGNOSIS — E872 Acidosis: Secondary | ICD-10-CM | POA: Diagnosis not present

## 2019-04-04 DIAGNOSIS — Z87442 Personal history of urinary calculi: Secondary | ICD-10-CM

## 2019-04-04 DIAGNOSIS — Z79890 Hormone replacement therapy (postmenopausal): Secondary | ICD-10-CM

## 2019-04-04 DIAGNOSIS — E119 Type 2 diabetes mellitus without complications: Secondary | ICD-10-CM | POA: Diagnosis not present

## 2019-04-04 DIAGNOSIS — Z7982 Long term (current) use of aspirin: Secondary | ICD-10-CM

## 2019-04-04 DIAGNOSIS — J320 Chronic maxillary sinusitis: Secondary | ICD-10-CM | POA: Diagnosis not present

## 2019-04-04 DIAGNOSIS — Z79899 Other long term (current) drug therapy: Secondary | ICD-10-CM

## 2019-04-04 DIAGNOSIS — R451 Restlessness and agitation: Secondary | ICD-10-CM | POA: Diagnosis not present

## 2019-04-04 DIAGNOSIS — N2581 Secondary hyperparathyroidism of renal origin: Secondary | ICD-10-CM | POA: Diagnosis not present

## 2019-04-04 DIAGNOSIS — F329 Major depressive disorder, single episode, unspecified: Secondary | ICD-10-CM | POA: Diagnosis present

## 2019-04-04 DIAGNOSIS — I639 Cerebral infarction, unspecified: Secondary | ICD-10-CM | POA: Diagnosis not present

## 2019-04-04 DIAGNOSIS — Z801 Family history of malignant neoplasm of trachea, bronchus and lung: Secondary | ICD-10-CM

## 2019-04-04 DIAGNOSIS — K635 Polyp of colon: Secondary | ICD-10-CM | POA: Diagnosis present

## 2019-04-04 LAB — RENAL FUNCTION PANEL
Albumin: 1.9 g/dL — ABNORMAL LOW (ref 3.5–5.0)
Anion gap: 11 (ref 5–15)
BUN: 66 mg/dL — ABNORMAL HIGH (ref 8–23)
CO2: 24 mmol/L (ref 22–32)
Calcium: 7.4 mg/dL — ABNORMAL LOW (ref 8.9–10.3)
Chloride: 104 mmol/L (ref 98–111)
Creatinine, Ser: 6.5 mg/dL — ABNORMAL HIGH (ref 0.61–1.24)
GFR calc Af Amer: 9 mL/min — ABNORMAL LOW (ref >60)
GFR calc non Af Amer: 8 mL/min — ABNORMAL LOW (ref >60)
Glucose, Bld: 133 mg/dL — ABNORMAL HIGH (ref 70–99)
Phosphorus: 2.3 mg/dL — ABNORMAL LOW (ref 2.5–4.6)
Potassium: 4.7 mmol/L (ref 3.5–5.1)
Sodium: 139 mmol/L (ref 135–145)

## 2019-04-04 LAB — GLUCOSE, CAPILLARY
Glucose-Capillary: 127 mg/dL — ABNORMAL HIGH (ref 70–99)
Glucose-Capillary: 139 mg/dL — ABNORMAL HIGH (ref 70–99)
Glucose-Capillary: 110 mg/dL — ABNORMAL HIGH (ref 70–99)
Glucose-Capillary: 135 mg/dL — ABNORMAL HIGH (ref 70–99)

## 2019-04-04 LAB — CBC WITH DIFFERENTIAL/PLATELET
Abs Immature Granulocytes: 0.21 10*3/uL — ABNORMAL HIGH (ref 0.00–0.07)
Abs Immature Granulocytes: 0.44 10*3/uL — ABNORMAL HIGH (ref 0.00–0.07)
Basophils Absolute: 0.1 10*3/uL (ref 0.0–0.1)
Basophils Absolute: 0.1 10*3/uL (ref 0.0–0.1)
Basophils Relative: 0 %
Basophils Relative: 1 %
Eosinophils Absolute: 0.2 10*3/uL (ref 0.0–0.5)
Eosinophils Absolute: 0.1 10*3/uL (ref 0.0–0.5)
Eosinophils Relative: 2 %
Eosinophils Relative: 1 %
HCT: 16.2 % — ABNORMAL LOW (ref 39.0–52.0)
HCT: 18.2 % — ABNORMAL LOW (ref 39.0–52.0)
Hemoglobin: 5.1 g/dL — CL (ref 13.0–17.0)
Hemoglobin: 6.1 g/dL — CL (ref 13.0–17.0)
Immature Granulocytes: 2 %
Immature Granulocytes: 3 %
Lymphocytes Relative: 17 %
Lymphocytes Relative: 9 %
Lymphs Abs: 1.9 10*3/uL (ref 0.7–4.0)
Lymphs Abs: 1.2 10*3/uL (ref 0.7–4.0)
MCH: 33.3 pg (ref 26.0–34.0)
MCH: 31.6 pg (ref 26.0–34.0)
MCHC: 31.5 g/dL (ref 30.0–36.0)
MCHC: 33.5 g/dL (ref 30.0–36.0)
MCV: 105.9 fL — ABNORMAL HIGH (ref 80.0–100.0)
MCV: 94.3 fL (ref 80.0–100.0)
Monocytes Absolute: 0.8 10*3/uL (ref 0.1–1.0)
Monocytes Absolute: 0.7 10*3/uL (ref 0.1–1.0)
Monocytes Relative: 7 %
Monocytes Relative: 5 %
Neutro Abs: 8.1 10*3/uL — ABNORMAL HIGH (ref 1.7–7.7)
Neutro Abs: 10.4 10*3/uL — ABNORMAL HIGH (ref 1.7–7.7)
Neutrophils Relative %: 72 %
Neutrophils Relative %: 81 %
Platelets: 226 10*3/uL (ref 150–400)
Platelets: 183 10*3/uL (ref 150–400)
RBC: 1.53 MIL/uL — ABNORMAL LOW (ref 4.22–5.81)
RBC: 1.93 MIL/uL — ABNORMAL LOW (ref 4.22–5.81)
RDW: 15.2 % (ref 11.5–15.5)
RDW: 18.4 % — ABNORMAL HIGH (ref 11.5–15.5)
WBC: 11.3 10*3/uL — ABNORMAL HIGH (ref 4.0–10.5)
WBC: 12.8 10*3/uL — ABNORMAL HIGH (ref 4.0–10.5)
nRBC: 0 % (ref 0.0–0.2)
nRBC: 0 % (ref 0.0–0.2)

## 2019-04-04 LAB — COMPREHENSIVE METABOLIC PANEL
ALT: 11 U/L (ref 0–44)
AST: 21 U/L (ref 15–41)
Albumin: 1.9 g/dL — ABNORMAL LOW (ref 3.5–5.0)
Alkaline Phosphatase: 50 U/L (ref 38–126)
Anion gap: 12 (ref 5–15)
BUN: 68 mg/dL — ABNORMAL HIGH (ref 8–23)
CO2: 24 mmol/L (ref 22–32)
Calcium: 7.3 mg/dL — ABNORMAL LOW (ref 8.9–10.3)
Chloride: 103 mmol/L (ref 98–111)
Creatinine, Ser: 6.76 mg/dL — ABNORMAL HIGH (ref 0.61–1.24)
GFR calc Af Amer: 9 mL/min — ABNORMAL LOW (ref >60)
GFR calc non Af Amer: 8 mL/min — ABNORMAL LOW (ref >60)
Glucose, Bld: 135 mg/dL — ABNORMAL HIGH (ref 70–99)
Potassium: 4.8 mmol/L (ref 3.5–5.1)
Sodium: 139 mmol/L (ref 135–145)
Total Bilirubin: 0.4 mg/dL (ref 0.3–1.2)
Total Protein: 4.3 g/dL — ABNORMAL LOW (ref 6.5–8.1)

## 2019-04-04 LAB — PREPARE RBC (CROSSMATCH)

## 2019-04-04 MED ORDER — LOSARTAN POTASSIUM 50 MG PO TABS: 100.0000 mg | ORAL_TABLET | Freq: Every day | ORAL | Status: DC

## 2019-04-04 MED ORDER — PANTOPRAZOLE SODIUM 40 MG IV SOLR
40.0000 mg | Freq: Two times a day (BID) | INTRAVENOUS | Status: DC
  Administered 2019-04-08 – 2019-04-10 (×3): 40 mg via INTRAVENOUS
  Filled 2019-04-04 (×3): qty 40

## 2019-04-04 MED ORDER — INSULIN ASPART 100 UNIT/ML ~~LOC~~ SOLN
0.0000 [IU] | Freq: Three times a day (TID) | SUBCUTANEOUS | Status: DC
  Administered 2019-04-10: 5 [IU] via SUBCUTANEOUS
  Administered 2019-04-10: 3 [IU] via SUBCUTANEOUS
  Administered 2019-04-11: 5 [IU] via SUBCUTANEOUS
  Administered 2019-04-12: 3 [IU] via SUBCUTANEOUS
  Administered 2019-04-12: 2 [IU] via SUBCUTANEOUS
  Administered 2019-04-12: 3 [IU] via SUBCUTANEOUS
  Administered 2019-04-13: 5 [IU] via SUBCUTANEOUS
  Administered 2019-04-13: 3 [IU] via SUBCUTANEOUS
  Administered 2019-04-13: 2 [IU] via SUBCUTANEOUS
  Administered 2019-04-14 – 2019-04-16 (×3): 3 [IU] via SUBCUTANEOUS
  Administered 2019-04-16: 5 [IU] via SUBCUTANEOUS

## 2019-04-04 MED ORDER — SODIUM CHLORIDE 0.9 % IV SOLN: Freq: Once | INTRAVENOUS | Status: AC

## 2019-04-04 MED ORDER — SODIUM CHLORIDE 0.9 % IV SOLN
80.0000 mg | Freq: Once | INTRAVENOUS | Status: AC
  Administered 2019-04-04: 80 mg via INTRAVENOUS
  Filled 2019-04-04: qty 80

## 2019-04-04 MED ORDER — SODIUM CHLORIDE 0.9% IV SOLUTION: Freq: Once | INTRAVENOUS | Status: AC

## 2019-04-04 MED ORDER — CHLORHEXIDINE GLUCONATE CLOTH 2 % EX PADS: 6.0000 | MEDICATED_PAD | Freq: Every day | CUTANEOUS | Status: DC

## 2019-04-04 MED ORDER — DARBEPOETIN ALFA 60 MCG/0.3ML IJ SOSY
PREFILLED_SYRINGE | INTRAMUSCULAR | Status: AC
  Administered 2019-04-04: 13:00:00 60 ug via INTRAVENOUS
  Filled 2019-04-04: qty 0.3

## 2019-04-04 MED ORDER — FUROSEMIDE 10 MG/ML IJ SOLN
40.0000 mg | Freq: Once | INTRAMUSCULAR | Status: AC
  Administered 2019-04-04: 40 mg via INTRAVENOUS
  Filled 2019-04-04: qty 4

## 2019-04-04 MED ORDER — SODIUM CHLORIDE 0.9 % IV SOLN: INTRAVENOUS | Status: AC

## 2019-04-04 MED ORDER — METOPROLOL SUCCINATE ER 50 MG PO TB24: 50.0000 mg | ORAL_TABLET | Freq: Every day | ORAL | Status: DC

## 2019-04-04 MED ORDER — PANTOPRAZOLE SODIUM 40 MG IV SOLR
40.0000 mg | Freq: Two times a day (BID) | INTRAVENOUS | Status: DC
  Administered 2019-04-04: 03:00:00 40 mg via INTRAVENOUS
  Filled 2019-04-04 (×2): qty 40

## 2019-04-04 MED ORDER — ATORVASTATIN CALCIUM 80 MG PO TABS
80.0000 mg | ORAL_TABLET | Freq: Every day | ORAL | Status: DC
  Administered 2019-04-10 – 2019-04-16 (×7): 80 mg via ORAL
  Filled 2019-04-04 (×8): qty 1

## 2019-04-04 MED ORDER — AMLODIPINE BESYLATE 2.5 MG PO TABS: 2.5000 mg | ORAL_TABLET | Freq: Every day | ORAL | Status: DC

## 2019-04-04 MED ORDER — HEPARIN SODIUM (PORCINE) 1000 UNIT/ML IJ SOLN
INTRAMUSCULAR | Status: AC
  Filled 2019-04-04: qty 4

## 2019-04-04 MED ORDER — TRAZODONE HCL 100 MG PO TABS
100.0000 mg | ORAL_TABLET | Freq: Every day | ORAL | Status: DC
  Administered 2019-04-05 – 2019-04-07 (×2): 100 mg via ORAL
  Filled 2019-04-04 (×2): qty 1

## 2019-04-04 MED ORDER — SODIUM CHLORIDE 0.9 % IV SOLN
8.0000 mg/h | INTRAVENOUS | Status: AC
  Administered 2019-04-05 – 2019-04-07 (×6): 8 mg/h via INTRAVENOUS
  Filled 2019-04-04 (×7): qty 80

## 2019-04-04 NOTE — Progress Notes (Signed)
Patient discharged from [facility] and admitted to [facility] [id number] for closer observation. Patient's wife notified of this.

## 2019-04-04 NOTE — Progress Notes (Addendum)
[facility] PHYSICAL MEDICINE & REHABILITATION PROGRESS NOTE   Subjective/Complaints:  Called overnight with pt having coffee ground emesis and melanic stools- ordered H/H- Hb was 5.1- started 2 units pRBCs asap with 40 mg IV Lasix x 1 in between since pt oliguric.   Was due to HD today- was taken to HD with blood running.   Pt said didn't feel "great"- denies any other issues.   Vomited x2 overnight- coffee grounds stools  Consulted GI- they have made him NPO at midnight and liquid diet until then for likely source of peptic ulcer bleeding- Getting EGD in AM.    ROS: Patient denies  cough, shortness of breath or chest pain, joint or back pain, headache, or mood change.    Objective:   No results found. Recent Labs    04/02/19 0629 04/04/19 0241  WBC 7.8 11.3*  HGB 7.2* 5.1*  HCT 21.9* 16.2*  PLT 201 226   Recent Labs    04/04/19 0240 04/04/19 0241  NA 139 139  K 4.7 4.8  CL 104 103  CO2 24 24  GLUCOSE 133* 135*  BUN 66* 68*  CREATININE 6.50* 6.76*  CALCIUM 7.4* 7.3*    Intake/Output Summary (Last 24 hours) at 04/04/2019 1505 Last data filed at 04/04/2019 1230 Gross per 24 hour  Intake 967.82 ml  Output --  Net 967.82 ml     Physical Exam: Vital Signs Blood pressure (!) 101/49, pulse 86, temperature 98.3 F (36.8 C), temperature source Oral, resp. rate 18, height 6' (1.829 m), weight 92.8 kg, SpO2 97 %. Constitutional: No distress . Vital signs reviewed. Slightly lethargic/tired HEENT: EOMI, oral membranes moist.Ptosis ~50%  Neck: supple Cardiovascular: RRR without murmur. No JVD    Respiratory: a little coarse; good air movement B/L   GI: BS but very quiet; no tinkling; slightly TTP diffusely; ND; overall soft Musculoskeletal:  General: Normal range of motion.  no pain with ROM   Right forearm with swelling, tender to palpation, along distal radial aspect.  Neurological: lethargic. Followed basic commands ie pick up R foot, pick up left foot,  did not squeeze to command .c. Left CN III palsy with ptosiscould not do full MMT due to lethargy Skin: Skin iswarm.  Psychiatric: flat  Assessment/Plan: 1. 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  Physiatrist is providing close team supervision and 24 hour management of active medical problems listed below.  Physiatrist and rehab team continue to assess barriers to discharge/monitor patient progress toward functional and medical goals  Care Tool:  Bathing  Bathing activity did not occur: Refused Body parts bathed by patient: Right arm, Left arm, Chest, Abdomen, Front perineal area, Right upper leg, Left upper leg, Face, Buttocks, Right lower leg, Left lower leg   Body parts bathed by helper: Buttocks, Right lower leg, Left lower leg     Bathing assist Assist Level: Minimal Assistance - Patient > 75%     Upper Body Dressing/Undressing Upper body dressing   What is the patient wearing?: Pull over shirt    Upper body assist Assist Level: Supervision/Verbal cueing    Lower Body Dressing/Undressing Lower body dressing      What is the patient wearing?: Incontinence brief, Pants     Lower body assist Assist for lower body dressing: Moderate Assistance - Patient 50 - 74%     Toileting Toileting Toileting Activity did not occur Landscape architect and  hygiene only): N/A (no void or bm)  Toileting assist Assist for toileting: Minimal Assistance - Patient > 75%     Transfers Chair/bed transfer  Transfers assist  Chair/bed transfer activity did not occur: Safety/medical concerns  Chair/bed transfer assist level: Minimal Assistance - Patient > 75%     Locomotion Ambulation   Ambulation assist      Assist level: Moderate Assistance - Patient 50 - 74% Assistive device: Walker-rolling Max distance: 25   Walk 10 feet  activity   Assist     Assist level: Moderate Assistance - Patient - 50 - 74% Assistive device: Walker-rolling   Walk 50 feet activity   Assist Walk 50 feet with 2 turns activity did not occur: Safety/medical concerns  Assist level: Moderate Assistance - Patient - 50 - 74% Assistive device: No Device, Hand held assist    Walk 150 feet activity   Assist Walk 150 feet activity did not occur: Safety/medical concerns         Walk 10 feet on uneven surface  activity   Assist Walk 10 feet on uneven surfaces activity did not occur: Safety/medical concerns(Per report)         Wheelchair     Assist   Type of Wheelchair: Manual    Wheelchair assist level: Minimal Assistance - Patient > 75% Max wheelchair distance: 138ft    Wheelchair 50 feet with 2 turns activity    Assist        Assist Level: Minimal Assistance - Patient > 75%   Wheelchair 150 feet activity     Assist      Assist Level: Moderate Assistance - Patient 50 - 74%   Blood pressure (!) 101/49, pulse 86, temperature 98.3 F (36.8 C), temperature source Oral, resp. rate 18, height 6' (1.829 m), weight 92.8 kg, SpO2 97 %.  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 Antithrombotics: -DVT/anticoagulation:Pharmaceutical:Heparin -antiplatelet therapy: ASA/PLAVIX X 3, due to heme + stool , will d/c Clopidgrel 2 d early  weeks followed by ASA alone. 3. Pain Management:tylenol prn 4. Mood:LCSW to follow for evaluation and support. -antipsychotic agents: N/A 5. Neuropsych: This patientiscapable of making decisions on hisown behalf. 6. Skin/Wound Care:Routine pressure relief measures.  2/4: Has area of swelling that is tender to palpation along right distal radial aspect  of forearm. Placed general nursing order for ice administration to reduce pain and swelling.  7. Fluids/Electrolytes/Nutrition:Monitor I/O. Has been refusing nectar liquids. Push PO  2/4: Electrolytes personally reviewed this morning and stable with exception of rising Creatinine.  8. T2DM with neuropathy/nephropathy: Hgb A1c- Was on ozempic, Lantus, Humalog and glucotrol PTA.            CBG (last 3)  Recent Labs    04/03/19 2046 04/04/19 0212 04/04/19 0623  GLUCAP 140* 127* 139*  fair control 2/3  9. ESRD- on PD at nights.per nephro convert to HD due to SNF placement,Has IJ, received PD last noc.  2/4: Personally reviewed labs today. Creatinine increased to 8.04 from 6.46. Has first HD today; appreciate Nephrology notes. Discussed plan of care with patient and answered his questions.      10. Hypokalemia/Hypomagnesemia: supps per nephro. - nephro ordered KCL 62meq TID 11. Anemia of chronic disease: On aranesp weekly. Hgb 6.7 this am  Stool OB positive, had colonoscopy last year  showing diverticular disease ,transfused x 2 U PRBC , hold Heparin, sq, will get some heparin with HD  Elevated MCV check MMA, B12  2/6- Hb down to 5.1 this AM- gave 2 units of pRBCs- will recheck labs this afternoon to see if need to give more- getting EGD by GI tomorrow AM due to GI bleed. Spoke to renal- they will avoid Heparin. Started PPI IV q12 hours overnight; Plavix off- still on ASA 81 mg- GI didn't address, so won't stop for now, due to being here for new CVA.  12. Chronic diarrhea:Continue creon tid with meals.   -no diarrhea currently 14.HTN: Monitor BP tid--on metoprolol, losartan and low dose amlodipine             1/24 fair control  1/30: labile  1/31: well controlled.  15. Chronic dizziness/Orthostatic changes?:  no dizziness reported   -will check orthostatic VS x 2 days 16. Prolonged QTC: QTC 528 on EKG 1/14 17. Lethargy: improved , post op 47.  Dysphagia - stroke no sign of aspiration     Have spent >40 minutes today on care;  Calling GI, as well as family and dealing with overall pt care and talking to renal  LOS: 15 days A FACE TO FACE EVALUATION WAS PERFORMED  Jayko Voorhees 04/04/2019, 3:05 PM

## 2019-04-04 NOTE — Progress Notes (Signed)
I received a call that pt came back from dialysis- he had another episode of melana/coffee grounds emesis in sand his post 2 units pRBCs H/H showed Hb of 6.1, so only cam eup 1 unit for given 2 units AND also had another episode of GI bleeding in dialysis which made it necessary to order 2 MORE units of pRBCs, to try and get his Hb up to 7.0.  In addition, spoke with GI attending- pt is planned for EGD tomorrow AM, not Monday AM, and needs to be NPO tonight (order was put in for Sunday night)  At midnight for EGD Sunday AM.  Also, he felt, pt was trending towards being unstable since still bleeding- would benefit from being on telemetry which we don't have in inpt rehab/CIR, AND needs a Protonix gtt- which we weren't aware could be done in CIR.  I spoke to an IM hospitalist, who agreed pt would benefit from being either on tele or step down,  Of note, the last vitals I was given were a BP around 100/50s and pulse 90s to low 100s.  Pt is a HD pt due to ESRD and also had a recent CVA which is why he's on CIR currently- R hemiparesis and L hemiataxia as well as L eye CN III palsy, He has DMII, as well as prolonged QT interval and hx of HTN.  Will d/c from CIR and have pt readmitted to acute hospital either tele or step down; he's in the middle of his 2nd set of 2 units pRBCs, his current Hb is 6.1 and WBC has increased up to 12.8, although is afebrile; the plan is for EGD in AM- please make pt NPO at midnight- call Dr Dagoberto Ligas via [facility] if any questions.

## 2019-04-04 NOTE — Significant Event (Signed)
CRITICAL VALUE ALERT  Critical Value:  Hgb-5.1   Date & Time Notied:  04/04/2019, 0325 hrs  Provider Notified: Dr. Dagoberto Ligas  Orders Received/Actions taken: Orders given, please see chart

## 2019-04-04 NOTE — Progress Notes (Signed)
Blood transfusion started in Rehab - unit [id number], Volume 315 ml completed at 2002, witnessed by Trilby Drummer the shift Camera operator. Unable to document transfusion completion on flowsheet due to admission from another facility. Elita Boone, BSN, RN

## 2019-04-04 NOTE — H&P (Signed)
History and Physical    Kyle [name] KGM:[id number] DOB: January 26, 1951 DOA: 04/04/2019  PCP: Idelle Crouch, MD (Confirm with patient/family/NH records and if not entered, this has to be entered at [facility] point of entry) Patient coming from: transfer from inpatient rehab  I have personally briefly reviewed patient's old medical records in [place]  Chief Complaint: upper GI bleed  HPI: Kyle [name] is a 69 y.o. male with medical history significant for being on acute rehab following a stroke, admitted to rehab on 03/20/19 on Plavix and Aspirin. He has ESRD and had been on peritoneal dialysis for the past 15 months and has recently been transitioned to hemodialysis. He has been having anemia this month dropping to 5.1 and 5.8 on 03/30/19. He was transfused to 7.5 on 03/30/19 with further decline. Last night, 04/03/19 he had acute onset of black and red stools X 5 overnight and also vomited coffee grounds one time. Denies any previous episodes of bleeding. Denies associated abdominal pain, heartburn, or dysphagia. Received 2 units of PRBCs and went to dialysis today where he had additional episodes of melanic stools.Hgb was still at 6g after 2 unit transfusion. Dr. Dagoberto Ligas, rehab coverage, ordered to additional units. Dr. Michail Sermon has consulted and plans EGD in AM, 04/05/19. He recommended transferring patient to inpatient telemetry. Colonoscopy in Oct 2020 in [place] where adenomatous polyps and a tubulovillous adenoma were removed and extensive diverticulosis was noted. Dr. Dagoberto Ligas called [facility] to admit patient for continuing management.(For level 3, the HPI must include 4+ descriptors: Location, Quality, Severity, Duration, Timing, Context, modifying factors, associated signs/symptoms and/or status of 3+ chronic problems.)  (Please avoid self-populating past medical history here) (The initial 2-3 lines should be focused and good to copy and paste in the HPI section of the daily progress  note).  ED Course: patient not seen in ED  Review of Systems: As per HPI otherwise 10 point review of systems negative. Specifically denies abdominal pain.  Unacceptable ROS statements: "10 systems reviewed," "Extensive" (without elaboration).  Acceptable ROS statements: "All others negative," "All others reviewed and are negative," and "All others unremarkable," with at [place] documented Can't double dip - if using for HPI can't use for ROS  Past Medical History:  Diagnosis Date  . Anxiety   . BPH (benign prostatic hyperplasia)   . Chronic kidney disease   . Colon polyps   . COPD (chronic obstructive pulmonary disease) ([place])   . Depression   . Diabetes mellitus without complication ([place])   . Diverticulosis of colon   . Hyperlipidemia   . Hypertension   . Hypothyroidism   . Nephrolithiasis   . Seizures ([place])     Past Surgical History:  Procedure Laterality Date  . AV FISTULA PLACEMENT Left 03/30/2019   Procedure: right brachiocephalic fistula creation;  Surgeon: Marty Heck, MD;  Location: [place];  Service: Vascular;  Laterality: Left;  . COLONOSCOPY    . COLONOSCOPY WITH PROPOFOL N/A 05/30/2015   Procedure: COLONOSCOPY WITH PROPOFOL;  Surgeon: Manya Silvas, MD;  Location[facility] ENDOSCOPY;  Service: Endoscopy;  Laterality: N/A;  . COLONOSCOPY WITH PROPOFOL N/A 12/01/2018   Procedure: COLONOSCOPY WITH PROPOFOL;  Surgeon: Toledo, [name], MD;  Location: [facility] ENDOSCOPY;  Service: Gastroenterology;  Laterality: N/A;  . INSERTION OF DIALYSIS CATHETER Right 03/30/2019   Procedure: Ultrasound guided right internal jugular tunneled dialysis catheter placement;  Surgeon: Marty Heck, MD;  Location: [place];  Service: Vascular;  Laterality: Right;  .  kidney stone    . THYROIDECTOMY    . VARICOCELE EXCISION    . VIDEO BRONCHOSCOPY Bilateral 05/25/2016   Procedure: VIDEO BRONCHOSCOPY WITHOUT FLUORO;  Surgeon: Juanito Doom, MD;  Location: [facility] ENDOSCOPY;  Service:  Cardiopulmonary;  Laterality: Bilateral;   Social Hx - married, no children. Worked in Designer, jewellery. Lives with wife.    reports that he has been smoking cigarettes. He has a 40.00 pack-year smoking history. He has never used smokeless tobacco. He reports current alcohol use of about 8.0 standard drinks of alcohol per week. He reports that he does not use drugs.  No Known Allergies  Family History  Problem Relation Age of Onset  . Diabetes Mother   . Diabetes Maternal Grandmother   . Diabetes Maternal Grandfather   . Lung cancer Father   . Emphysema Paternal Grandfather    Unacceptable: Noncontributory, unremarkable, or negative. Acceptable: Family history reviewed and not pertinent (If you reviewed it)  Prior to Admission medications   Medication Sig Start Date End Date Taking? Authorizing Provider  amLODipine (NORVASC) 2.5 MG tablet Take 1 tablet (2.5 mg total) by mouth daily. 03/20/19   Loletha Grayer, MD  aspirin EC 81 MG EC tablet Take 1 tablet (81 mg total) by mouth daily. 03/20/19   Loletha Grayer, MD  atorvastatin (LIPITOR) 80 MG tablet Take 1 tablet (80 mg total) by mouth daily. 03/20/19 04/19/19  Loletha Grayer, MD  calcitRIOL (ROCALTROL) 0.5 MCG capsule Take 1 capsule (0.5 mcg total) by mouth daily. 03/21/19   Loletha Grayer, MD  calcium acetate (PHOSLO) 667 MG capsule Take 2 capsules (1,334 mg total) by mouth 3 (three) times daily with meals. 03/20/19   Loletha Grayer, MD  clopidogrel (PLAVIX) 75 MG tablet Take 1 tablet (75 mg total) by mouth daily for 18 days. 03/20/19 04/07/19  Loletha Grayer, MD  gabapentin (NEURONTIN) 100 MG capsule TAKE 1 CAPSULE BY MOUTH TWICE A DAY FOR ONE WEEK, THEN INCREASE TO 2 CAPSULES TWICE A DAY AND CONTINUE AS DIRECTED 03/05/19   [provider]  gentamicin cream (GARAMYCIN) 0.1 % Apply 1 application topically daily. 03/20/19   Loletha Grayer, MD  insulin glargine (LANTUS) 100 unit/mL SOPN Inject 0.05 mLs (5 Units total)  into the skin at bedtime. 03/20/19   Loletha Grayer, MD  insulin lispro (HUMALOG) 100 UNIT/ML KwikPen 2 units subcutaneous injection for sugars 200-250, 3 units for sugars 251-300; 4 units for sugars greater than 301 03/20/19   Wieting, Richard, MD  levETIRAcetam (KEPPRA) 500 MG tablet Take 500 mg by mouth 2 (two) times a day. 09/05/15   [provider]  levothyroxine (SYNTHROID) 200 MCG tablet Take 200 mcg by mouth every morning. 02/25/19   [provider]  losartan (COZAAR) 100 MG tablet Take 100 mg by mouth daily. 09/12/18   [provider]  metoprolol succinate (TOPROL-XL) 50 MG 24 hr tablet Take 1 tablet by mouth daily. 03/15/15 03/12/19  [provider]  multivitamin (RENA-VIT) TABS tablet Take 1 tablet by mouth at bedtime. 03/20/19   Loletha Grayer, MD  Nutritional Supplements (FEEDING SUPPLEMENT, NEPRO CARB STEADY,) LIQD Take 237 mLs by mouth 2 (two) times daily between meals. 03/20/19   Loletha Grayer, MD  Pancrelipase, Lip-Prot-Amyl, 24000-76000 units CPEP Take 1 capsule by mouth 3 (three) times daily.    [provider]  sodium bicarbonate 650 MG tablet Take 1 tablet (650 mg total) by mouth 2 (two) times daily. 03/20/19   Loletha Grayer, MD  traZODone (DESYREL) 50  MG tablet Take 100 mg by mouth at bedtime.    [provider]    Physical Exam: There were no vitals filed for this visit.  General:  Chronically ill appearing man in no acute distress, calm, comfortable There were no vitals filed for this visit. Eyes: PERRL, lids and conjunctivae normal ENMT: Mucous membranes are moist. Posterior pharynx clear of any exudate or lesions.  Neck: normal, supple, no masses, no thyromegaly Respiratory: clear to auscultation bilaterally, no wheezing, no crackles. Normal respiratory effort. No accessory muscle use.  Cardiovascular: Regular rate and rhythm, no murmurs / rubs / gallops. No extremity edema. 1+ pedal pulses. No carotid bruits.    Abdomen: soft, no tenderness, no masses palpated. No hepatosplenomegaly. Bowel sounds positive.  Musculoskeletal: no clubbing / cyanosis. No joint deformity upper and lower extremities. Good ROM, no contractures. Normal muscle tone.  Skin: no rashes, lesions, ulcers. No induration Neurologic: CN 2-12: droopy left eye but he can open it. EOMI . Sensation intact,  Strength 4/5 RUE, RLE; 5/5 LUE/LLE Psychiatric: Normal judgment and insight. Alert and oriented x 3. Normal mood.   (Anything < 9 systems with 2 bullets each down codes to level 1) (If patient refuses exam can't bill higher level) (Make sure to document decubitus ulcers present on admission -- if possible -- and whether patient has chronic indwelling catheter at time of admission)  Labs on Admission: I have personally reviewed following labs and imaging studies  CBC: Recent Labs  Lab 03/31/19 0541 04/01/19 0708 04/02/19 0629 04/04/19 0241 04/04/19 1543  WBC 13.2* 8.7 7.8 11.3* 12.8*  NEUTROABS  --   --   --  8.1* 10.4*  HGB 6.7* 7.1* 7.2* 5.1* 6.1*  HCT 20.3* 21.6* 21.9* 16.2* 18.2*  MCV 101.5* 101.4* 100.5* 105.9* 94.3  PLT 225 195 201 226 237   Basic Metabolic Panel: Recent Labs  Lab 03/31/19 0541 03/31/19 0541 04/01/19 0708 04/02/19 0525 04/03/19 0755 04/04/19 0240 04/04/19 0241  NA 133*   < > 136 137 139 139 139  K 4.4   < > 3.9 4.3 4.1 4.7 4.8  CL 96*   < > 97* 103 101 104 103  CO2 22   < > 25 22 28 24 24   GLUCOSE 307*   < > 140* 155* 159* 133* 135*  BUN 56*   < > 34* 42* 30* 66* 68*  CREATININE 9.17*   < > 6.46* 8.04* 5.32* 6.50* 6.76*  CALCIUM 7.8*   < > 7.4* 7.7* 7.8* 7.4* 7.3*  PHOS 4.4  --  3.4 3.7 2.3* 2.3*  --    < > = values in this interval not displayed.   GFR: Estimated Creatinine Clearance: 12.3 mL/min (A) (by C-G formula based on SCr of 6.76 mg/dL (H)). Liver Function Tests: Recent Labs  Lab 04/01/19 0708 04/02/19 0525 04/03/19 0755 04/04/19 0240 04/04/19 0241  AST  --   --   --    --  21  ALT  --   --   --   --  11  ALKPHOS  --   --   --   --  50  BILITOT  --   --   --   --  0.4  PROT  --   --   --   --  4.3*  ALBUMIN 1.8* 1.9* 2.0* 1.9* 1.9*   No results for input(s): LIPASE, AMYLASE in the last 168 hours. No results for input(s): AMMONIA in the last 168 hours. Coagulation  Profile: No results for input(s): INR, PROTIME in the last 168 hours. Cardiac Enzymes: No results for input(s): CKTOTAL, CKMB, CKMBINDEX, TROPONINI in the last 168 hours. BNP (last 3 results) No results for input(s): PROBNP in the last 8760 hours. HbA1C: No results for input(s): HGBA1C in the last 72 hours. CBG: Recent Labs  Lab 04/03/19 1625 04/03/19 2046 04/04/19 0212 04/04/19 0623 04/04/19 1636  GLUCAP 145* 140* 127* 139* 110*   Lipid Profile: No results for input(s): CHOL, HDL, LDLCALC, TRIG, CHOLHDL, LDLDIRECT in the last 72 hours. Thyroid Function Tests: No results for input(s): TSH, T4TOTAL, FREET4, T3FREE, THYROIDAB in the last 72 hours. Anemia Panel: No results for input(s): VITAMINB12, FOLATE, FERRITIN, TIBC, IRON, RETICCTPCT in the last 72 hours. Urine analysis:    Component Value Date/Time   COLORURINE YELLOW 03/22/2019 1622   APPEARANCEUR CLEAR 03/22/2019 1622   APPEARANCEUR Clear 08/08/2013 1947   LABSPEC 1.018 03/22/2019 1622   LABSPEC 1.012 08/08/2013 1947   PHURINE 5.0 03/22/2019 1622   GLUCOSEU >=500 (A) 03/22/2019 1622   GLUCOSEU 50 mg/dL 08/08/2013 1947   HGBUR NEGATIVE 03/22/2019 [place] 03/22/2019 1622   BILIRUBINUR Negative 08/08/2013 1947   KETONESUR NEGATIVE 03/22/2019 1622   PROTEINUR >=300 (A) 03/22/2019 1622   NITRITE NEGATIVE 03/22/2019 1622   LEUKOCYTESUR NEGATIVE 03/22/2019 1622   LEUKOCYTESUR Negative 08/08/2013 1947    Radiological Exams on Admission: No results found.  EKG: Independently reviewed. No recent EKG  Assessment/Plan Active Problems:   GI bleed   Chronic obstructive pulmonary disease (HCC)    Diabetes mellitus (HCC)   ESRD (end stage renal disease) (HCC)   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HCC)   Acute upper GI bleed  (please populate well all problems here in Problem List. (For example, if patient is on BP meds at home and you resume or decide to hold them, it is a problem that needs to be her. Same for CAD, COPD, HLD and so on)   1. GI - patient with active bleed, most likely upper. He has had at least 4 units PRBCs and continued melanic stool. He has been hemodynamically stable. Plan Admit to progressive care unit on tele 2/2 active bleed  Complete current unit blood  H/H 2 hrs after completion current unit and the q6  Dr. Michail Sermon, for GI, has consults and plans EGD in AM  2. ESRD - patient had HD today. Plan Notify renal service of patient's location - ok for continued HD  3. DM - glycemic control ordered: no basal insulin while acutely ill  4. COPD - appears stable  5. Neuro - s/p Pontine CVA with right sided weakness and abnormal gait. Plan Stop ASA/Plavix  Return to inpatient rehab when medically stable.   DVT prophylaxis: SCDs (Lovenox/Heparin/SCD's/anticoagulated/None (if comfort care) Code Status: full code (Full/Partial (specify details) Family Communication: called wife: reviewed dx and tx. (Specify name, relationship. Do not write "discussed with patient". Specify tel # if discussed over the phone) Disposition Plan: return to inpatient rehab when medically stable (specify when and where you expect patient to be discharged) Consults called: Dr. Michail Sermon for GI (with names) Admission status: stepdown (inpatient / obs / tele / medical floor / SDU)   Adella Hare MD [facility] Pager [phone]  If 7PM-7AM, please contact night-coverage [email or web address] Password [facility]  04/04/2019, 7:43 PM

## 2019-04-04 NOTE — H&P (View-Only) (Signed)
Referring Provider: Dr. Letta Pate Primary Care Physician:  Idelle Crouch, MD Primary Gastroenterologist:  Althia Forts  Reason for Consultation:  GI bleed  HPI: Kyle [name] is a 69 y.o. male in acute rehab following a stroke and was admitted to rehab on 03/20/19 on Plavix and Aspirin. He has ESRD and had been on peritoneal dialysis for the past 15 months and has recently been transitioned to hemodialysis. He has been having anemia this month dropping to 5.1 today and 5.8 on 03/30/19. He was transfused to 7.5 on 03/30/19 with further decline. Last night he had acute onset of black and red stools X 5 overnight and also vomited coffee grounds one time. Denies any previous episodes of bleeding. Denies associated abdominal pain, heartburn, or dysphagia. Receiving first of 2 units of PRBCs and scheduled to go to dialysis today. Colonoscopy in Oct 2020 in [place] where adenomatous polyps and a tubulovillous adenoma were removed and extensive diverticulosis was noted.  Nurses in room.    Past Medical History:  Diagnosis Date  . Anxiety   . BPH (benign prostatic hyperplasia)   . Chronic kidney disease   . Colon polyps   . COPD (chronic obstructive pulmonary disease) ([place])   . Depression   . Diabetes mellitus without complication ([place])   . Diverticulosis of colon   . Hyperlipidemia   . Hypertension   . Hypothyroidism   . Nephrolithiasis   . Seizures ([place])     Past Surgical History:  Procedure Laterality Date  . AV FISTULA PLACEMENT Left 03/30/2019   Procedure: right brachiocephalic fistula creation;  Surgeon: Marty Heck, MD;  Location: [place];  Service: Vascular;  Laterality: Left;  . COLONOSCOPY    . COLONOSCOPY WITH PROPOFOL N/A 05/30/2015   Procedure: COLONOSCOPY WITH PROPOFOL;  Surgeon: Manya Silvas, MD;  Location[facility] ENDOSCOPY;  Service: Endoscopy;  Laterality: N/A;  . COLONOSCOPY WITH PROPOFOL N/A 12/01/2018   Procedure: COLONOSCOPY WITH PROPOFOL;  Surgeon: Toledo,  [name], MD;  Location: [facility] ENDOSCOPY;  Service: Gastroenterology;  Laterality: N/A;  . INSERTION OF DIALYSIS CATHETER Right 03/30/2019   Procedure: Ultrasound guided right internal jugular tunneled dialysis catheter placement;  Surgeon: Marty Heck, MD;  Location: [facility] OR;  Service: Vascular;  Laterality: Right;  . kidney stone    . THYROIDECTOMY    . VARICOCELE EXCISION    . VIDEO BRONCHOSCOPY Bilateral 05/25/2016   Procedure: VIDEO BRONCHOSCOPY WITHOUT FLUORO;  Surgeon: Juanito Doom, MD;  Location: [facility] ENDOSCOPY;  Service: Cardiopulmonary;  Laterality: Bilateral;    Prior to Admission medications   Medication Sig Start Date End Date Taking? Authorizing Provider  amLODipine (NORVASC) 2.5 MG tablet Take 1 tablet (2.5 mg total) by mouth daily. 03/20/19   Loletha Grayer, MD  aspirin EC 81 MG EC tablet Take 1 tablet (81 mg total) by mouth daily. 03/20/19   Loletha Grayer, MD  atorvastatin (LIPITOR) 80 MG tablet Take 1 tablet (80 mg total) by mouth daily. 03/20/19 04/19/19  Loletha Grayer, MD  calcitRIOL (ROCALTROL) 0.5 MCG capsule Take 1 capsule (0.5 mcg total) by mouth daily. 03/21/19   Loletha Grayer, MD  calcium acetate (PHOSLO) 667 MG capsule Take 2 capsules (1,334 mg total) by mouth 3 (three) times daily with meals. 03/20/19   Loletha Grayer, MD  clopidogrel (PLAVIX) 75 MG tablet Take 1 tablet (75 mg total) by mouth daily for 18 days. 03/20/19 04/07/19  Loletha Grayer, MD  gabapentin (NEURONTIN) 100 MG capsule TAKE 1 CAPSULE BY MOUTH TWICE A  DAY FOR ONE WEEK, THEN INCREASE TO 2 CAPSULES TWICE A DAY AND CONTINUE AS DIRECTED 03/05/19   [provider]  gentamicin cream (GARAMYCIN) 0.1 % Apply 1 application topically daily. 03/20/19   Loletha Grayer, MD  insulin glargine (LANTUS) 100 unit/mL SOPN Inject 0.05 mLs (5 Units total) into the skin at bedtime. 03/20/19   Loletha Grayer, MD  insulin lispro (HUMALOG) 100 UNIT/ML KwikPen 2 units subcutaneous injection for sugars  200-250, 3 units for sugars 251-300; 4 units for sugars greater than 301 03/20/19   Wieting, Richard, MD  levETIRAcetam (KEPPRA) 500 MG tablet Take 500 mg by mouth 2 (two) times a day. 09/05/15   [provider]  levothyroxine (SYNTHROID) 200 MCG tablet Take 200 mcg by mouth every morning. 02/25/19   [provider]  losartan (COZAAR) 100 MG tablet Take 100 mg by mouth daily. 09/12/18   [provider]  metoprolol succinate (TOPROL-XL) 50 MG 24 hr tablet Take 1 tablet by mouth daily. 03/15/15 03/12/19  [provider]  multivitamin (RENA-VIT) TABS tablet Take 1 tablet by mouth at bedtime. 03/20/19   Loletha Grayer, MD  Nutritional Supplements (FEEDING SUPPLEMENT, NEPRO CARB STEADY,) LIQD Take 237 mLs by mouth 2 (two) times daily between meals. 03/20/19   Loletha Grayer, MD  Pancrelipase, Lip-Prot-Amyl, 24000-76000 units CPEP Take 1 capsule by mouth 3 (three) times daily.    [provider]  sodium bicarbonate 650 MG tablet Take 1 tablet (650 mg total) by mouth 2 (two) times daily. 03/20/19   Loletha Grayer, MD  traZODone (DESYREL) 50 MG tablet Take 100 mg by mouth at bedtime.    [provider]    Scheduled Meds: . aspirin EC  81 mg Oral Daily  . atorvastatin  80 mg Oral Daily  . Chlorhexidine Gluconate Cloth  6 each Topical Q0600  . darbepoetin (ARANESP) injection - DIALYSIS  60 mcg Intravenous Q Sat-HD  . feeding supplement (NEPRO CARB STEADY)  237 mL Oral BID BM  . gabapentin  100 mg Oral BID  . insulin aspart  0-15 Units Subcutaneous TID WC  . insulin glargine  30 Units Subcutaneous Daily  . levETIRAcetam  500 mg Oral BID  . levothyroxine  200 mcg Oral q morning - 10a  . lipase/protease/amylase  24,000 Units Oral TID WC  . metoprolol succinate  50 mg Oral Daily  . modafinil  100 mg Oral Daily  . multivitamin  1 tablet Oral Daily  . pantoprazole (PROTONIX) IV  40 mg Intravenous BID   Continuous Infusions: . ferric gluconate  (FERRLECIT/NULECIT) IV     PRN Meds:.acetaminophen, bisacodyl, guaiFENesin-dextromethorphan, polyethylene glycol, prochlorperazine **OR** prochlorperazine **OR** prochlorperazine, Resource ThickenUp Clear  Allergies as of 03/20/2019  . (No Known Allergies)    Family History  Problem Relation Age of Onset  . Diabetes Mother   . Diabetes Maternal Grandmother   . Diabetes Maternal Grandfather   . Lung cancer Father   . Emphysema Paternal Grandfather     Social History   Socioeconomic History  . Marital status: Married    Spouse name: Not on file  . Number of children: Not on file  . Years of education: Not on file  . Highest education level: Not on file  Occupational History  . Not on file  Tobacco Use  . Smoking status: Light Tobacco Smoker    Packs/day: 1.00    Years: 40.00    Pack years: 40.00    Types: Cigarettes    Last attempt to  quit: 05/25/2016    Years since quitting: 2.8  . Smokeless tobacco: Never Used  Substance and Sexual Activity  . Alcohol use: Yes    Alcohol/week: 8.0 standard drinks    Types: 8 Cans of beer per week    Comment: beer or wine   . Drug use: No  . Sexual activity: Not on file  Other Topics Concern  . Not on file  Social History Narrative  . Not on file   Social Determinants of Health   Financial Resource Strain:   . Difficulty of Paying Living Expenses: Not on file  Food Insecurity:   . Worried About Charity fundraiser in the Last Year: Not on file  . Ran Out of Food in the Last Year: Not on file  Transportation Needs:   . Lack of Transportation (Medical): Not on file  . Lack of Transportation (Non-Medical): Not on file  Physical Activity:   . Days of Exercise per Week: Not on file  . Minutes of Exercise per Session: Not on file  Stress:   . Feeling of Stress : Not on file  Social Connections:   . Frequency of Communication with Friends and Family: Not on file  . Frequency of Social Gatherings with Friends and Family: Not on  file  . Attends Religious Services: Not on file  . Active Member of Clubs or Organizations: Not on file  . Attends Archivist Meetings: Not on file  . Marital Status: Not on file  Intimate Partner Violence:   . Fear of Current or Ex-Partner: Not on file  . Emotionally Abused: Not on file  . Physically Abused: Not on file  . Sexually Abused: Not on file    Review of Systems: All negative except as stated above in HPI.  Physical Exam: Vital signs: Vitals:   04/04/19 0919 04/04/19 1046  BP: (!) 124/48 (!) 108/53  Pulse: 97 92  Resp: 18 16  Temp: 98.4 F (36.9 C) 98.3 F (36.8 C)  SpO2: 100% 100%   Last BM Date: 04/04/19 General:   Somnolent, well-nourished, elderly, no acute distress Head: normocephalic, atraumatic Eyes: anicteric sclera, ptosis of left eyelid ENT: oropharynx clear Neck: supple, nontender Lungs:  Clear throughout to auscultation.   No wheezes, crackles, or rhonchi. No acute distress. Heart:  Regular rate and rhythm; no murmurs, clicks, rubs,  or gallops. Abdomen: soft, nontender, nondistended, +BS  Rectal:  Deferred Ext: no edema  GI:  Lab Results: Recent Labs    04/02/19 0629 04/04/19 0241  WBC 7.8 11.3*  HGB 7.2* 5.1*  HCT 21.9* 16.2*  PLT 201 226   BMET Recent Labs    04/03/19 0755 04/04/19 0240 04/04/19 0241  NA 139 139 139  K 4.1 4.7 4.8  CL 101 104 103  CO2 28 24 24   GLUCOSE 159* 133* 135*  BUN 30* 66* 68*  CREATININE 5.32* 6.50* 6.76*  CALCIUM 7.8* 7.4* 7.3*   LFT Recent Labs    04/04/19 0241  PROT 4.3*  ALBUMIN 1.9*  AST 21  ALT 11  ALKPHOS 50  BILITOT 0.4   PT/INR No results for input(s): LABPROT, INR in the last 72 hours.   Studies/Results: No results found.  Impression/Plan: Melenic stools and coffee grounds emesis concerning for a peptic ulcer source. Hemodynamically stable and receiving blood transfusions. Plavix has been discontinued. Agree with PPI IV Q 12 hours. EGD tomorrow morning with  Propofol. Clear liquid diet and NPO p MN.  LOS: 15 days   [name]  04/04/2019, 11:17 AM  Questions please call [phone]

## 2019-04-04 NOTE — Significant Event (Addendum)
Pt had two large, loose to watery black stools with frank blood. Last night's stool specimen was positive for hemoccult. Pt also vomited coffee ground liquid with blood clots with the last BM. Patient cleaned and vital signs taken. Patient stated his abdomen feels fine. Compazine IM administered. IV started by Dierdre Forth, RN. Dr. Dagoberto Ligas informed with orders. Will monitor.

## 2019-04-04 NOTE — Progress Notes (Signed)
Physical Therapy Session Note  Patient Details  Name: Kyle [name] MRN: [id number] Date of Birth: 12/23/1950  Today's Date: 04/04/2019     Short Term Goals: Week 3:  PT Short Term Goal 1 (Week 3): STG=LTG due to ELOS  Skilled Therapeutic Interventions/Progress Updates:  Pt received supine in bed with Dialysis DO present for assessment. Pt arousable with significant verbal and physical stimuli from PT, but only able to keep eyes open 1-2 seconds. Pt noted to be receiving pRBCs. Per chart, latest Hgb 5.1. Spoke with MD, given pt presentation, and verbal orders to hold PT at this time.       Therapy Documentation Precautions:  Precautions Precautions: Fall Restrictions Weight Bearing Restrictions: No General: PT Amount of Missed Time (min): 60 Minutes PT Missed Treatment Reason: MD hold (Comment) Vital Signs: Therapy Vitals Temp: 98.3 F (36.8 C) Temp Source: Oral Pulse Rate: 92 Resp: 16 BP: (!) 108/53 Patient Position (if appropriate): Lying Oxygen Therapy SpO2: 100 % O2 Device: Room Air    Therapy/Group: Individual Therapy  Lorie Phenix 04/04/2019, 10:48 AM

## 2019-04-04 NOTE — Consult Note (Addendum)
Referring Provider: Dr. Letta Pate Primary Care Physician:  Idelle Crouch, MD Primary Gastroenterologist:  Althia Forts  Reason for Consultation:  GI bleed  HPI: Kyle [name] is a 69 y.o. male in acute rehab following a stroke and was admitted to rehab on 03/20/19 on Plavix and Aspirin. He has ESRD and had been on peritoneal dialysis for the past 15 months and has recently been transitioned to hemodialysis. He has been having anemia this month dropping to 5.1 today and 5.8 on 03/30/19. He was transfused to 7.5 on 03/30/19 with further decline. Last night he had acute onset of black and red stools X 5 overnight and also vomited coffee grounds one time. Denies any previous episodes of bleeding. Denies associated abdominal pain, heartburn, or dysphagia. Receiving first of 2 units of PRBCs and scheduled to go to dialysis today. Colonoscopy in Oct 2020 in [place] where adenomatous polyps and a tubulovillous adenoma were removed and extensive diverticulosis was noted.  Nurses in room.    Past Medical History:  Diagnosis Date  . Anxiety   . BPH (benign prostatic hyperplasia)   . Chronic kidney disease   . Colon polyps   . COPD (chronic obstructive pulmonary disease) ([employer])   . Depression   . Diabetes mellitus without complication ([place])   . Diverticulosis of colon   . Hyperlipidemia   . Hypertension   . Hypothyroidism   . Nephrolithiasis   . Seizures ([place])     Past Surgical History:  Procedure Laterality Date  . AV FISTULA PLACEMENT Left 03/30/2019   Procedure: right brachiocephalic fistula creation;  Surgeon: Marty Heck, MD;  Location: [place];  Service: Vascular;  Laterality: Left;  . COLONOSCOPY    . COLONOSCOPY WITH PROPOFOL N/A 05/30/2015   Procedure: COLONOSCOPY WITH PROPOFOL;  Surgeon: Manya Silvas, MD;  Location: [facility] ENDOSCOPY;  Service: Endoscopy;  Laterality: N/A;  . COLONOSCOPY WITH PROPOFOL N/A 12/01/2018   Procedure: COLONOSCOPY WITH PROPOFOL;  Surgeon: Toledo,  [name], MD;  Location: [facility] ENDOSCOPY;  Service: Gastroenterology;  Laterality: N/A;  . INSERTION OF DIALYSIS CATHETER Right 03/30/2019   Procedure: Ultrasound guided right internal jugular tunneled dialysis catheter placement;  Surgeon: Marty Heck, MD;  [facility] OR;  Service: Vascular;  Laterality: Right;  . kidney stone    . THYROIDECTOMY    . VARICOCELE EXCISION    . VIDEO BRONCHOSCOPY Bilateral 05/25/2016   Procedure: VIDEO BRONCHOSCOPY WITHOUT FLUORO;  Surgeon: Juanito Doom, MD;  Location: [facility] ENDOSCOPY;  Service: Cardiopulmonary;  Laterality: Bilateral;    Prior to Admission medications   Medication Sig Start Date End Date Taking? Authorizing Provider  amLODipine (NORVASC) 2.5 MG tablet Take 1 tablet (2.5 mg total) by mouth daily. 03/20/19   Loletha Grayer, MD  aspirin EC 81 MG EC tablet Take 1 tablet (81 mg total) by mouth daily. 03/20/19   Loletha Grayer, MD  atorvastatin (LIPITOR) 80 MG tablet Take 1 tablet (80 mg total) by mouth daily. 03/20/19 04/19/19  Loletha Grayer, MD  calcitRIOL (ROCALTROL) 0.5 MCG capsule Take 1 capsule (0.5 mcg total) by mouth daily. 03/21/19   Loletha Grayer, MD  calcium acetate (PHOSLO) 667 MG capsule Take 2 capsules (1,334 mg total) by mouth 3 (three) times daily with meals. 03/20/19   Loletha Grayer, MD  clopidogrel (PLAVIX) 75 MG tablet Take 1 tablet (75 mg total) by mouth daily for 18 days. 03/20/19 04/07/19  Loletha Grayer, MD  gabapentin (NEURONTIN) 100 MG capsule TAKE 1 CAPSULE BY MOUTH TWICE A  DAY FOR ONE WEEK, THEN INCREASE TO 2 CAPSULES TWICE A DAY AND CONTINUE AS DIRECTED 03/05/19   [provider]  gentamicin cream (GARAMYCIN) 0.1 % Apply 1 application topically daily. 03/20/19   Loletha Grayer, MD  insulin glargine (LANTUS) 100 unit/mL SOPN Inject 0.05 mLs (5 Units total) into the skin at bedtime. 03/20/19   Loletha Grayer, MD  insulin lispro (HUMALOG) 100 UNIT/ML KwikPen 2 units subcutaneous injection for sugars  200-250, 3 units for sugars 251-300; 4 units for sugars greater than 301 03/20/19   Wieting, Richard, MD  levETIRAcetam (KEPPRA) 500 MG tablet Take 500 mg by mouth 2 (two) times a day. 09/05/15   [provider]  levothyroxine (SYNTHROID) 200 MCG tablet Take 200 mcg by mouth every morning. 02/25/19   [provider]  losartan (COZAAR) 100 MG tablet Take 100 mg by mouth daily. 09/12/18   [provider]  metoprolol succinate (TOPROL-XL) 50 MG 24 hr tablet Take 1 tablet by mouth daily. 03/15/15 03/12/19  [provider]  multivitamin (RENA-VIT) TABS tablet Take 1 tablet by mouth at bedtime. 03/20/19   Loletha Grayer, MD  Nutritional Supplements (FEEDING SUPPLEMENT, NEPRO CARB STEADY,) LIQD Take 237 mLs by mouth 2 (two) times daily between meals. 03/20/19   Loletha Grayer, MD  Pancrelipase, Lip-Prot-Amyl, 24000-76000 units CPEP Take 1 capsule by mouth 3 (three) times daily.    [provider]  sodium bicarbonate 650 MG tablet Take 1 tablet (650 mg total) by mouth 2 (two) times daily. 03/20/19   Loletha Grayer, MD  traZODone (DESYREL) 50 MG tablet Take 100 mg by mouth at bedtime.    [provider]    Scheduled Meds: . aspirin EC  81 mg Oral Daily  . atorvastatin  80 mg Oral Daily  . Chlorhexidine Gluconate Cloth  6 each Topical Q0600  . darbepoetin (ARANESP) injection - DIALYSIS  60 mcg Intravenous Q Sat-HD  . feeding supplement (NEPRO CARB STEADY)  237 mL Oral BID BM  . gabapentin  100 mg Oral BID  . insulin aspart  0-15 Units Subcutaneous TID WC  . insulin glargine  30 Units Subcutaneous Daily  . levETIRAcetam  500 mg Oral BID  . levothyroxine  200 mcg Oral q morning - 10a  . lipase/protease/amylase  24,000 Units Oral TID WC  . metoprolol succinate  50 mg Oral Daily  . modafinil  100 mg Oral Daily  . multivitamin  1 tablet Oral Daily  . pantoprazole (PROTONIX) IV  40 mg Intravenous BID   Continuous Infusions: . ferric gluconate  (FERRLECIT/NULECIT) IV     PRN Meds:.acetaminophen, bisacodyl, guaiFENesin-dextromethorphan, polyethylene glycol, prochlorperazine **OR** prochlorperazine **OR** prochlorperazine, Resource ThickenUp Clear  Allergies as of 03/20/2019  . (No Known Allergies)    Family History  Problem Relation Age of Onset  . Diabetes Mother   . Diabetes Maternal Grandmother   . Diabetes Maternal Grandfather   . Lung cancer Father   . Emphysema Paternal Grandfather     Social History   Socioeconomic History  . Marital status: Married    Spouse name: Not on file  . Number of children: Not on file  . Years of education: Not on file  . Highest education level: Not on file  Occupational History  . Not on file  Tobacco Use  . Smoking status: Light Tobacco Smoker    Packs/day: 1.00    Years: 40.00    Pack years: 40.00    Types: Cigarettes    Last attempt to  quit: 05/25/2016    Years since quitting: 2.8  . Smokeless tobacco: Never Used  Substance and Sexual Activity  . Alcohol use: Yes    Alcohol/week: 8.0 standard drinks    Types: 8 Cans of beer per week    Comment: beer or wine   . Drug use: No  . Sexual activity: Not on file  Other Topics Concern  . Not on file  Social History Narrative  . Not on file   Social Determinants of Health   Financial Resource Strain:   . Difficulty of Paying Living Expenses: Not on file  Food Insecurity:   . Worried About Charity fundraiser in the Last Year: Not on file  . Ran Out of Food in the Last Year: Not on file  Transportation Needs:   . Lack of Transportation (Medical): Not on file  . Lack of Transportation (Non-Medical): Not on file  Physical Activity:   . Days of Exercise per Week: Not on file  . Minutes of Exercise per Session: Not on file  Stress:   . Feeling of Stress : Not on file  Social Connections:   . Frequency of Communication with Friends and Family: Not on file  . Frequency of Social Gatherings with Friends and Family: Not on  file  . Attends Religious Services: Not on file  . Active Member of Clubs or Organizations: Not on file  . Attends Archivist Meetings: Not on file  . Marital Status: Not on file  Intimate Partner Violence:   . Fear of Current or Ex-Partner: Not on file  . Emotionally Abused: Not on file  . Physically Abused: Not on file  . Sexually Abused: Not on file    Review of Systems: All negative except as stated above in HPI.  Physical Exam: Vital signs: Vitals:   04/04/19 0919 04/04/19 1046  BP: (!) 124/48 (!) 108/53  Pulse: 97 92  Resp: 18 16  Temp: 98.4 F (36.9 C) 98.3 F (36.8 C)  SpO2: 100% 100%   Last BM Date: 04/04/19 General:   Somnolent, well-nourished, elderly, no acute distress Head: normocephalic, atraumatic Eyes: anicteric sclera, ptosis of left eyelid ENT: oropharynx clear Neck: supple, nontender Lungs:  Clear throughout to auscultation.   No wheezes, crackles, or rhonchi. No acute distress. Heart:  Regular rate and rhythm; no murmurs, clicks, rubs,  or gallops. Abdomen: soft, nontender, nondistended, +BS  Rectal:  Deferred Ext: no edema  GI:  Lab Results: Recent Labs    04/02/19 0629 04/04/19 0241  WBC 7.8 11.3*  HGB 7.2* 5.1*  HCT 21.9* 16.2*  PLT 201 226   BMET Recent Labs    04/03/19 0755 04/04/19 0240 04/04/19 0241  NA 139 139 139  K 4.1 4.7 4.8  CL 101 104 103  CO2 28 24 24   GLUCOSE 159* 133* 135*  BUN 30* 66* 68*  CREATININE 5.32* 6.50* 6.76*  CALCIUM 7.8* 7.4* 7.3*   LFT Recent Labs    04/04/19 0241  PROT 4.3*  ALBUMIN 1.9*  AST 21  ALT 11  ALKPHOS 50  BILITOT 0.4   PT/INR No results for input(s): LABPROT, INR in the last 72 hours.   Studies/Results: No results found.  Impression/Plan: Melenic stools and coffee grounds emesis concerning for a peptic ulcer source. Hemodynamically stable and receiving blood transfusions. Plavix has been discontinued. Agree with PPI IV Q 12 hours. EGD tomorrow morning with  Propofol. Clear liquid diet and NPO p MN.  LOS: 15 days   [name]  04/04/2019, 11:17 AM  Questions please call [phone]

## 2019-04-04 NOTE — Progress Notes (Signed)
Treatment completed but no fluid remove per MD order because patient was getting sick whenever we started removing fluid. Patient also have black and red stool X 1 during dialysis. We continue to monitor

## 2019-04-04 NOTE — Progress Notes (Signed)
[facility] Progress Note    Assessment/ Plan:    ESRD: Transitioned from PD to HD on 2/2, plan for HD today, will hold heparin during session due to below.  RUE AVF placed on 2/1.  Awaiting outpatient HD placement.   Anemia of CKD   Suspected Blood loss anemia: Hgb 5.1 today. Likely UGIB given melena (in setting of anti-plt therapy w/ CVA). Receiving pRBCs and IV PPI per primary, recommend GI consult. On IV iron w/ HD and Aranesp weekly, due today.    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    Hypertension: Stable. On norvasc + metoprolol. Could d/c norvasc if SBP decreases w/ GI losses.    Acute [place] Stroke: Currently in CIR. On ASA, plavix dc'd.     T2DM  Hypothyroidism: Per primary.    Subjective:   Noted to have melena overnight, with hemoglobin resulting at 5.1 this morning.  Started on blood transfusion and IV PPI.  No complaints this a.m., wants to sleep.   Objective:   BP 112/62 (BP Location: Right Leg)   Pulse (!) 104   Temp 98.5 F (36.9 C) (Axillary)   Resp 20   Ht 6' (1.829 m)   Wt 95 kg   SpO2 99%   BMI 28.40 kg/m   Intake/Output Summary (Last 24 hours) at 04/04/2019 0731 Last data filed at 04/03/2019 1900 Gross per 24 hour  Intake 840 ml  Output --  Net 840 ml   Weight change: -2.8 kg  Physical Exam: General: NAD, sleeping comfortably Cardiac: RRR  Lungs: Clear bilaterally, no increased WOB  Abdomen: soft, PD catheter in place Neuro/psych: Sleeping, able to arouse easily with voice.  Following simple commands appropriately, can move extremities spontaneously. Ext: Warm, dry, no edema  Access: Right IJ tunnel catheter and LUE aVF with palpable thrill/audible bruit  Imaging: DG Swallowing Func-Speech Pathology  Result Date: 04/02/2019 Objective Swallowing Evaluation: Type of Study: MBS-Modified Barium Swallow Study  Patient Details Name: Breyson Kelm MRN: [id number] Date of Birth: 11/29/1950 Today's Date:  04/02/2019 Time: SLP Start Time (ACUTE ONLY): 2505 -SLP Stop Time (ACUTE ONLY): 3976 SLP Time Calculation (min) (ACUTE ONLY): 24 min Past Medical History: Past Medical History: Diagnosis Date . Anxiety  . BPH (benign prostatic hyperplasia)  . Chronic kidney disease  . Colon polyps  . COPD (chronic obstructive pulmonary disease) ([place])  . Depression  . Diabetes mellitus without complication ([place])  . Diverticulosis of colon  . Hyperlipidemia  . Hypertension  . Hypothyroidism  . Nephrolithiasis  . Seizures ([place])  Past Surgical History: Past Surgical History: Procedure Laterality Date . AV FISTULA PLACEMENT Left 03/30/2019  Procedure: right brachiocephalic fistula creation;  Surgeon: Marty Heck, MD;  Location: [place];  Service: Vascular;  Laterality: Left; . COLONOSCOPY   . COLONOSCOPY WITH PROPOFOL N/A 05/30/2015  Procedure: COLONOSCOPY WITH PROPOFOL;  Surgeon: Manya Silvas, MD;  Location: [facility] ENDOSCOPY;  Service: Endoscopy;  Laterality: N/A; . COLONOSCOPY WITH PROPOFOL N/A 12/01/2018  Procedure: COLONOSCOPY WITH PROPOFOL;  Surgeon: Toledo, [name], MD;  Location: [facility] ENDOSCOPY;  Service: Gastroenterology;  Laterality: N/A; . INSERTION OF DIALYSIS CATHETER Right 03/30/2019  Procedure: Ultrasound guided right internal jugular tunneled dialysis catheter placement;  Surgeon: Marty Heck, MD;  Location: [facility] OR;  Service: Vascular;  Laterality: Right; . kidney stone   . THYROIDECTOMY   . VARICOCELE EXCISION   . VIDEO BRONCHOSCOPY Bilateral 05/25/2016  Procedure: VIDEO BRONCHOSCOPY WITHOUT FLUORO;  Surgeon: Nathaneil Canary  Jerral Ralph, MD;  Location: [place];  Service: Cardiopulmonary;  Laterality: Bilateral; HPI: Pt is a 69 y/o male admitted for hypocalcemia. Pt has PMH to include Multiple medical issues including ESRD on peritoneal dialysis daily but has "missed" sessions per pt report, COPD, HTN, and seizures, Tobacco and ETOH use, Lung R Nodule, collapse of R lung. Pt currently receiving HHPT, however has been  declining physically and reports multiple falls. 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 Chronic small-vessel ischemic changes elsewhere throughout the brain.  Prior to admission per chart notes, pt reports that he has been feeling very lightheaded and weak for about the past 24 hours.  He had an episode earlier today where he was eating at a restaurant and suddenly felt very shaky, " like I was going to have a seizure".  He reports his last seizure being over 5 years ago and did not end up having a seizure episode today, states he has been compliant with his Keppra.  He had an appointment with his GI doctor later in the day for chronic diarrhea and had lab work performed at that time.  He then received a call this evening that his Calcium was critically low.  Per NSG report this AM, pt was coughing when trying to swallow Pills.  Subjective: Patient alert, sitting up in bed, dysarthric speech Assessment / Plan / Recommendation CHL IP CLINICAL IMPRESSIONS 04/02/2019 Clinical Impression Pt presents with mild pharyngeal dysphagia c/b decreased anterior hyoid movement. As such, pt has moderate amounts of vallecular residue with dysphagia 3. Liquid wash was helpful in clearing residue as pt had better response to heavier bolus when dysphagia 3 and liquid were present with liquid wash. Whole pill was administered with just thin liquids and also whole in puree. With each, whole pill was difficult to clear from the vallecula and required multiple boluses of puree to clear. Medicine must be CRUSHED with puree. 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 Recommend pt continue dysphagia 3, thin liquids with medicine crushed in puree. SLP Visit Diagnosis Dysphagia, pharyngeal phase (R13.13) Attention and concentration deficit following -- Frontal lobe and  executive function deficit following -- Impact on safety and function Mild aspiration risk   CHL IP TREATMENT RECOMMENDATION 04/02/2019 Treatment Recommendations Therapy as outlined in treatment plan below   Prognosis 03/17/2019 Prognosis for Safe Diet Advancement Fair Barriers to Reach Goals Cognitive deficits;Language deficits;Severity of deficits;Time post onset Barriers/Prognosis Comment -- CHL IP DIET RECOMMENDATION 04/02/2019 SLP Diet Recommendations Dysphagia 3 (Mech soft) solids;Thin liquid Liquid Administration via Cup;Straw Medication Administration Crushed with puree Compensations Minimize environmental distractions;Slow rate;Small sips/bites Postural Changes Seated upright at 90 degrees   CHL IP OTHER RECOMMENDATIONS 04/02/2019 Recommended Consults -- Oral Care Recommendations Oral care BID Other Recommendations --   CHL IP FOLLOW UP RECOMMENDATIONS 04/02/2019 Follow up Recommendations Skilled Nursing facility   [facility] IP FREQUENCY AND DURATION 03/18/2019 Speech Therapy Frequency (ACUTE ONLY) min 3x week Treatment Duration --      CHL IP ORAL PHASE 04/02/2019 Oral Phase WFL Oral - Pudding Teaspoon -- Oral - Pudding Cup -- Oral - Honey Teaspoon -- Oral - Honey Cup -- Oral - Nectar Teaspoon -- Oral - Nectar Cup -- Oral - Nectar Straw -- Oral - Thin Teaspoon -- Oral - Thin Cup -- Oral - Thin Straw -- Oral -  Puree -- Oral - Mech Soft -- Oral - Regular -- Oral - Multi-Consistency -- Oral - Pill -- Oral Phase - Comment --  CHL IP PHARYNGEAL PHASE 04/02/2019 Pharyngeal Phase -- Pharyngeal- Pudding Teaspoon -- Pharyngeal -- Pharyngeal- Pudding Cup -- Pharyngeal -- Pharyngeal- Honey Teaspoon -- Pharyngeal -- Pharyngeal- Honey Cup -- Pharyngeal -- Pharyngeal- Nectar Teaspoon -- Pharyngeal -- Pharyngeal- Nectar Cup -- Pharyngeal -- Pharyngeal- Nectar Straw -- Pharyngeal -- Pharyngeal- Thin Teaspoon -- Pharyngeal -- Pharyngeal- Thin Cup -- Pharyngeal -- Pharyngeal- Thin Straw -- Pharyngeal -- Pharyngeal- Puree Delayed swallow  initiation-vallecula Pharyngeal -- Pharyngeal- Mechanical Soft Compensatory strategies attempted (with notebox);Pharyngeal residue - valleculae Pharyngeal -- Pharyngeal- Regular -- Pharyngeal -- Pharyngeal- Multi-consistency -- Pharyngeal -- Pharyngeal- Pill Pharyngeal residue - valleculae;Other (Comment);Compensatory strategies attempted (with notebox) Pharyngeal -- Pharyngeal Comment --  CHL IP CERVICAL ESOPHAGEAL PHASE 04/02/2019 Cervical Esophageal Phase WFL Pudding Teaspoon -- Pudding Cup -- Honey Teaspoon -- Honey Cup -- Nectar Teaspoon -- Nectar Cup -- Nectar Straw -- Thin Teaspoon -- Thin Cup -- Thin Straw -- Puree -- Mechanical Soft -- Regular -- Multi-consistency -- Pill -- Cervical Esophageal Comment -- Happi Overton 04/02/2019, 2:28 PM               Labs: BMET Recent Labs  Lab 03/29/19 0706 03/29/19 7989 03/30/19 2119 03/30/19 4174 03/30/19 0708 03/31/19 0814 04/01/19 0708 04/02/19 0525 04/03/19 0755 04/04/19 0240 04/04/19 0241  NA 138   < > 136   < > 135 133* 136 137 139 139 139  K 4.3   < > 4.0   < > 4.2 4.4 3.9 4.3 4.1 4.7 4.8  CL 101   < > 99   < > 98 96* 97* 103 101 104 103  CO2 26   < > 24  --   --  22 25 22 28 24 24   GLUCOSE 139*   < > 137*   < > 121* 307* 140* 155* 159* 133* 135*  BUN 44*   < > 44*   < > 41* 56* 34* 42* 30* 66* 68*  CREATININE 8.64*   < > 8.37*   < > 9.20* 9.17* 6.46* 8.04* 5.32* 6.50* 6.76*  CALCIUM 8.6*   < > 8.4*  --   --  7.8* 7.4* 7.7* 7.8* 7.4* 7.3*  PHOS 5.0*  --  4.9*  --   --  4.4 3.4 3.7 2.3* 2.3*  --    < > = values in this interval not displayed.   CBC Recent Labs  Lab 03/31/19 0541 04/01/19 0708 04/02/19 0629 04/04/19 0241  WBC 13.2* 8.7 7.8 11.3*  NEUTROABS  --   --   --  8.1*  HGB 6.7* 7.1* 7.2* 5.1*  HCT 20.3* 21.6* 21.9* 16.2*  MCV 101.5* 101.4* 100.5* 105.9*  PLT 225 195 201 226    Medications:    . sodium chloride   Intravenous Once  . amLODipine  5 mg Oral Daily  . aspirin EC  81 mg Oral Daily  . atorvastatin  80 mg  Oral Daily  . Chlorhexidine Gluconate Cloth  6 each Topical Q0600  . darbepoetin (ARANESP) injection - DIALYSIS  60 mcg Intravenous Q Sat-HD  . feeding supplement (NEPRO CARB STEADY)  237 mL Oral BID BM  . furosemide  40 mg Intravenous Once  . gabapentin  100 mg Oral BID  . insulin aspart  0-15 Units Subcutaneous TID WC  . insulin glargine  30 Units Subcutaneous Daily  . levETIRAcetam  500 mg Oral BID  . levothyroxine  200 mcg Oral q morning - 10a  . lipase/protease/amylase  24,000 Units Oral TID WC  . metoprolol succinate  50 mg Oral Daily  . modafinil  100 mg Oral Daily  . multivitamin  1 tablet Oral Daily  . pantoprazole (PROTONIX) IV  40 mg Intravenous BID      Darrelyn Hillock, DO Family Medicine PGY-2  04/04/2019, 7:31 AM

## 2019-04-05 ENCOUNTER — Inpatient Hospital Stay (HOSPITAL_COMMUNITY): Payer: Medicare PPO | Admitting: Anesthesiology

## 2019-04-05 ENCOUNTER — Encounter (HOSPITAL_COMMUNITY)
Admission: AD | Disposition: A | Discharge status: Skilled Nursing Facility | Payer: Self-pay | Source: Ambulatory Visit | Attending: Internal Medicine

## 2019-04-05 ENCOUNTER — Encounter (HOSPITAL_COMMUNITY): Payer: Self-pay | Admitting: Internal Medicine

## 2019-04-05 ENCOUNTER — Inpatient Hospital Stay (HOSPITAL_COMMUNITY): Admission: AD | Admit: 2019-04-05 | Payer: Medicare PPO | Source: Ambulatory Visit | Admitting: Gastroenterology

## 2019-04-05 DIAGNOSIS — K921 Melena: Principal | ICD-10-CM

## 2019-04-05 DIAGNOSIS — Z794 Long term (current) use of insulin: Secondary | ICD-10-CM

## 2019-04-05 DIAGNOSIS — E119 Type 2 diabetes mellitus without complications: Secondary | ICD-10-CM

## 2019-04-05 DIAGNOSIS — J438 Other emphysema: Secondary | ICD-10-CM

## 2019-04-05 HISTORY — PX: ESOPHAGOGASTRODUODENOSCOPY (EGD) WITH PROPOFOL: SHX5813

## 2019-04-05 HISTORY — PX: BIOPSY: SHX5522

## 2019-04-05 LAB — POCT I-STAT, CHEM 8
BUN: 31 mg/dL — ABNORMAL HIGH (ref 8–23)
Calcium, Ion: 0.93 mmol/L — ABNORMAL LOW (ref 1.15–1.40)
Chloride: 99 mmol/L (ref 98–111)
Creatinine, Ser: 4.2 mg/dL — ABNORMAL HIGH (ref 0.61–1.24)
Glucose, Bld: 88 mg/dL (ref 70–99)
HCT: 19 % — ABNORMAL LOW (ref 39.0–52.0)
Hemoglobin: 6.5 g/dL — CL (ref 13.0–17.0)
Potassium: 4.1 mmol/L (ref 3.5–5.1)
Sodium: 138 mmol/L (ref 135–145)
TCO2: 28 mmol/L (ref 22–32)

## 2019-04-05 LAB — SURGICAL PCR SCREEN
MRSA, PCR: NEGATIVE
Staphylococcus aureus: NEGATIVE

## 2019-04-05 LAB — GLUCOSE, CAPILLARY
Glucose-Capillary: 112 mg/dL — ABNORMAL HIGH (ref 70–99)
Glucose-Capillary: 141 mg/dL — ABNORMAL HIGH (ref 70–99)
Glucose-Capillary: 69 mg/dL — ABNORMAL LOW (ref 70–99)
Glucose-Capillary: 70 mg/dL (ref 70–99)
Glucose-Capillary: 71 mg/dL (ref 70–99)

## 2019-04-05 LAB — PREPARE RBC (CROSSMATCH)

## 2019-04-05 LAB — HEMOGLOBIN AND HEMATOCRIT, BLOOD
HCT: 23.5 % — ABNORMAL LOW (ref 39.0–52.0)
Hemoglobin: 8.1 g/dL — ABNORMAL LOW (ref 13.0–17.0)

## 2019-04-05 SURGERY — ESOPHAGOGASTRODUODENOSCOPY (EGD) WITH PROPOFOL
Anesthesia: Monitor Anesthesia Care

## 2019-04-05 MED ORDER — NALOXONE HCL 0.4 MG/ML IJ SOLN
0.4000 mg | Freq: Once | INTRAMUSCULAR | Status: DC
  Filled 2019-04-05: qty 1

## 2019-04-05 MED ORDER — PROPOFOL 500 MG/50ML IV EMUL
INTRAVENOUS | Status: DC | PRN
  Administered 2019-04-05: 100 ug/kg/min via INTRAVENOUS

## 2019-04-05 MED ORDER — LEVOTHYROXINE SODIUM 100 MCG PO TABS
200.0000 ug | ORAL_TABLET | Freq: Every morning | ORAL | Status: DC
  Administered 2019-04-07 – 2019-04-16 (×9): 200 ug via ORAL
  Filled 2019-04-05 (×12): qty 2

## 2019-04-05 MED ORDER — LEVETIRACETAM 500 MG PO TABS
500.0000 mg | ORAL_TABLET | Freq: Two times a day (BID) | ORAL | Status: DC
  Administered 2019-04-05: 500 mg via ORAL
  Filled 2019-04-05 (×2): qty 1

## 2019-04-05 MED ORDER — SODIUM CHLORIDE 0.9 % IV SOLN: INTRAVENOUS | Status: DC

## 2019-04-05 MED ORDER — NALOXONE HCL 0.4 MG/ML IJ SOLN
INTRAMUSCULAR | Status: AC
  Filled 2019-04-05: qty 1

## 2019-04-05 MED ORDER — PHENYLEPHRINE HCL (PRESSORS) 10 MG/ML IV SOLN
INTRAVENOUS | Status: DC | PRN
  Administered 2019-04-05 (×2): 100 ug via INTRAVENOUS

## 2019-04-05 MED ORDER — SODIUM CHLORIDE 0.9% IV SOLUTION: Freq: Once | INTRAVENOUS | Status: DC

## 2019-04-05 MED ORDER — NALOXONE HCL 0.4 MG/ML IJ SOLN: 0.4000 mg | INTRAMUSCULAR | Status: DC | PRN

## 2019-04-05 MED ORDER — SODIUM CHLORIDE 0.9 % IV SOLN: INTRAVENOUS | Status: DC | PRN

## 2019-04-05 MED ORDER — LIDOCAINE HCL (CARDIAC) PF 50 MG/5ML IV SOSY
PREFILLED_SYRINGE | INTRAVENOUS | Status: DC | PRN
  Administered 2019-04-05: 30 mg via INTRAVENOUS

## 2019-04-05 SURGICAL SUPPLY — 14 items

## 2019-04-05 NOTE — Transfer of Care (Signed)
Immediate Anesthesia Transfer of Care Note  Patient: Kyle [name]  Procedure(s) Performed: ESOPHAGOGASTRODUODENOSCOPY (EGD) WITH PROPOFOL (N/A ) BIOPSY  Patient Location: PACU and PICU  Anesthesia Type:MAC  Level of Consciousness: awake and alert   Airway & Oxygen Therapy: Patient Spontanous Breathing and Patient connected to nasal cannula oxygen  Post-op Assessment: Report given to RN and Post -op Vital signs reviewed and stable  Post vital signs: Reviewed  Last Vitals:  Vitals Value Taken Time  BP 78/10 04/05/19 0954  Temp 36.9 C 04/05/19 0954  Pulse 73 04/05/19 1000  Resp 22 04/05/19 1000  SpO2 99 % 04/05/19 1000  Vitals shown include unvalidated device data.  Last Pain:  Vitals:   04/05/19 1000  TempSrc:   PainSc: 0-No pain         Complications: No apparent anesthesia complications

## 2019-04-05 NOTE — Progress Notes (Signed)
PROGRESS NOTE  Kyle [name] OZD:[id number] DOB: February 10, 1951 DOA: 04/04/2019 PCP: Idelle Crouch, MD   Brief Summary:  Patient was discharged from [facility] on 1/21 to CIR at [facility] after being treated for acute left midbrain stroke. He subsequently developed GI bleed  (acute onset of black and red stools X 5 overnight and also vomited coffee grounds one time while at rehab) and acute blood loss anemia and is admitted to [facility] on 2/6   Nephrology and GI following   HPI/Recap of past 24 hours:  Patient returned from EGD, developed apnea, he received one dose of narcan, now he is awake, no hypoxia, denies chest pain, no confusion, no fever   Assessment/Plan: Active Problems:   Chronic obstructive pulmonary disease (HCC)   Diabetes mellitus ([place])   ESRD (end stage renal disease) ([place])   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[place])   GI bleed   Acute upper GI bleed  Apnea, New on 2/7 after returned from EGD, appear respond to narcan Also endorse possible sleep apnea with a h/o copd , not on home o2 Will order prn narcan, bipap prn and qhs   GI bleed/blood loss anemia ( reason for admission) S/p EGD no bleeding source find in EGD, patient has a colonoscopy in 11/2018 "adenomatous polyps and a tubulovillous adenoma were removed and extensive diverticulosis was noted" He denies ab pain Getting 2prbc today GI recommend continue PPI, tagged RBC scan if rebleed GI will follow   HTN: Currently bp low normal, Hold home bp meds including  norvasc,  cozaar Change toprol-xl to low dose lopressor with holding parameters   CVA with difficulty with balance and ptosis of left eyelid. He was on asa/plavix for three wks then asa alone,  Hold asa in the setting of gi bleed, continue Statin   Seizure disorder on keppra chronically   Insulin dependent DM2 He was discharged on insulin (lantus and ssi) from recent hospitalization He is npo for gi procedure, lantus held, he is on ssi for  now   ESRD was on peritoneal dialysis transitioned to HD on 2/2 Status post RIJ TDC and left BCAVF placed by Dr. Carlis Abbott 2/1. Case discussed with nephrology Dr Carolin Sicks who will see patient in consult, plan per nephrology, nephrology input appreciated  COPD, not home o2 dependent,  No wheezing on exam, no hypoxia  Chronic RBBB Will get EKG in am to monitor Qtc,  there is a documented QTc prolongation in chart, I have reviewed previous EKG, although his RBBB is chronic , his qtc was 467 in 2015  Hypothyroidism, continue Synthroid  DVT Prophylaxis: scd  Code Status: full  Family Communication: patient , wife at bedside   Disposition Plan: remain in progressive unit   Consultants:  [facility]  nephrology  Procedures:  EGD on 2/7  hemodialysis  Antibiotics:  none   Objective: BP (!) 95/42   Pulse 69   Temp 98.4 F (36.9 C) (Oral)   Resp (!) 0   SpO2 98% Comment: receiving albumin  Intake/Output Summary (Last 24 hours) at 04/05/2019 1122 Last data filed at 04/05/2019 0946 Gross per 24 hour  Intake 1445.02 ml  Output --  Net 1445.02 ml   There were no vitals filed for this visit.  Exam: Patient is examined daily including today on 04/05/2019, exams remain the same as of yesterday except that has changed    General:  Weak, chronically ill appearing, left eyelid ptosis  Cardiovascular: RRR  Respiratory: CTABL  Abdomen: Soft/ND/NT,  positive BS, peritoneal dialysis catheter in place  Musculoskeletal: No Edema  Neuro: alert, oriented x3  Data Reviewed: Basic Metabolic Panel: Recent Labs  Lab 03/31/19 0541 03/31/19 0541 04/01/19 0708 04/01/19 0708 04/02/19 0525 04/03/19 0755 04/04/19 0240 04/04/19 0241 04/05/19 0905  NA 133*   < > 136   < > 137 139 139 139 138  K 4.4   < > 3.9   < > 4.3 4.1 4.7 4.8 4.1  CL 96*   < > 97*   < > 103 101 104 103 99  CO2 22   < > 25  --  22 28 24 24   --   GLUCOSE 307*   < > 140*   < > 155* 159* 133* 135* 88  BUN 56*    < > 34*   < > 42* 30* 66* 68* 31*  CREATININE 9.17*   < > 6.46*   < > 8.04* 5.32* 6.50* 6.76* 4.20*  CALCIUM 7.8*   < > 7.4*  --  7.7* 7.8* 7.4* 7.3*  --   PHOS 4.4  --  3.4  --  3.7 2.3* 2.3*  --   --    < > = values in this interval not displayed.   Liver Function Tests: Recent Labs  Lab 04/01/19 0708 04/02/19 0525 04/03/19 0755 04/04/19 0240 04/04/19 0241  AST  --   --   --   --  21  ALT  --   --   --   --  11  ALKPHOS  --   --   --   --  50  BILITOT  --   --   --   --  0.4  PROT  --   --   --   --  4.3*  ALBUMIN 1.8* 1.9* 2.0* 1.9* 1.9*   No results for input(s): LIPASE, AMYLASE in the last 168 hours. No results for input(s): AMMONIA in the last 168 hours. CBC: Recent Labs  Lab 03/31/19 0541 03/31/19 0541 04/01/19 0708 04/01/19 0708 04/02/19 0629 04/04/19 0241 04/04/19 1543 04/05/19 0237 04/05/19 0905  WBC 13.2*  --  8.7  --  7.8 11.3* 12.8*  --   --   NEUTROABS  --   --   --   --   --  8.1* 10.4*  --   --   HGB 6.7*   < > 7.1*   < > 7.2* 5.1* 6.1* 8.1* 6.5*  HCT 20.3*   < > 21.6*   < > 21.9* 16.2* 18.2* 23.5* 19.0*  MCV 101.5*  --  101.4*  --  100.5* 105.9* 94.3  --   --   PLT 225  --  195  --  201 226 183  --   --    < > = values in this interval not displayed.   Cardiac Enzymes:   No results for input(s): CKTOTAL, CKMB, CKMBINDEX, TROPONINI in the last 168 hours. BNP (last 3 results) No results for input(s): BNP in the last 8760 hours.  ProBNP (last 3 results) No results for input(s): PROBNP in the last 8760 hours.  CBG: Recent Labs  Lab 04/04/19 1636 04/04/19 2021 04/05/19 0033 04/05/19 0412 04/05/19 0759  GLUCAP 110* 135* 70 71 69*    Recent Results (from the past 240 hour(s))  Surgical pcr screen     Status: None   Collection Time: 04/04/19  8:22 PM   Specimen: Nasal Mucosa; Nasal Swab  Result Value Ref Range Status  MRSA, PCR NEGATIVE NEGATIVE Final   Staphylococcus aureus NEGATIVE NEGATIVE Final    Comment: (NOTE) The Xpert SA Assay  (FDA approved for NASAL specimens in patients 3 years of age and older), is one component of a comprehensive surveillance program. It is not intended to diagnose infection nor to guide or monitor treatment. Performed at [facility], [place] [address], [place], [place] [address]      Studies: No results found.  Scheduled Meds: . sodium chloride   Intravenous Once  . amLODipine  2.5 mg Oral Daily  . atorvastatin  80 mg Oral Daily  . insulin aspart  0-15 Units Subcutaneous TID WC  . metoprolol succinate  50 mg Oral Daily  . naloxone      . naloxone  0.4 mg Intramuscular Once  . [START ON 04/08/2019] pantoprazole  40 mg Intravenous Q12H  . traZODone  100 mg Oral QHS    Continuous Infusions: . sodium chloride 50 mL/hr at 04/04/19 2353  . pantoprozole (PROTONIX) infusion 8 mg/hr (04/05/19 0012)     Time spent: 24mins I have personally reviewed and interpreted on  04/05/2019 daily labs, tele strips, imagings as discussed above under date review session and assessment and plans.  I reviewed all nursing notes, pharmacy notes, consultant notes,  vitals, pertinent old records  I have discussed plan of care as described above with RN , patient and family on 04/05/2019   Florencia Reasons MD, PhD, FACP  [facility]  Available via Epic secure chat 7am-7pm for nonurgent issues Please page for urgent issues, pager number available through [email or web address] .   04/05/2019, 11:22 AM  LOS: 1 day

## 2019-04-05 NOTE — Anesthesia Procedure Notes (Signed)
Procedure Name: MAC Date/Time: 04/05/2019 9:25 AM Performed by: Eligha Bridegroom, CRNA Pre-anesthesia Checklist: Patient identified, Emergency Drugs available, Suction available, Patient being monitored and Timeout performed Patient Re-evaluated:Patient Re-evaluated prior to induction Oxygen Delivery Method: Nasal cannula Induction Type: IV induction

## 2019-04-05 NOTE — Progress Notes (Signed)
[facility] NEPHROLOGY PROGRESS NOTE  Assessment/ Plan: Pt is a 69 y.o. yo male withESRD was on PD transition to HD on 2/2.Status post stroke and plan to go to SNF.  He developed GI bleed on 2/6, received blood transfusion and moved to 4 N.  # ESRDtransitioned from PD to HD as the plan is to go to SNF. -Status post HD yesterday, unable to UF because of hypotension.  Volume status looks acceptable today.  Plan for next HD on Tuesday unless change in condition tomorrow.  Heneedsarrangement for outpatient HD.  Status post RIJ TDC and left BCAVF placed by Dr. Carlis Abbott 2/1. No heparin.  # Anemiadue to GI blood loss and ZOX:WRUEAVWU IV iron.  Continue ESA.  Receiving blood transfusion today.  Underwent GI endoscopy negative for a blood product or active bleeding.  Monitor hemoglobin.  # Secondary hyperparathyroidism: Phosphorus is low. I will discontinued binders.  # HTN/volumemonitor blood pressure. BP low probably due to GI bleed.  Discontinue amlodipine.  On metoprolol.  #Stroke: Undergoing rehabilitation. Subjective: Seen and examined at bedside.  Moved to medical floor from rehab because of GI bleed.  Had dialysis yesterday.  Denies nausea vomiting chest pain shortness of breath. Objective Vital signs in last 24 hours: Vitals:   04/05/19 1030 04/05/19 1119 04/05/19 1123 04/05/19 1146  BP: (!) 95/42 (!) 102/58  119/72  Pulse: 69 77  72  Resp: (!) 0 (!) 0 12 14  Temp:  97.8 F (36.6 C)  97.8 F (36.6 C)  TempSrc:  Oral  Oral  SpO2: 98% 100%  100%   Weight change:   Intake/Output Summary (Last 24 hours) at 04/05/2019 1237 Last data filed at 04/05/2019 0946 Gross per 24 hour  Intake 1445.02 ml  Output --  Net 1445.02 ml       Labs: Basic Metabolic Panel: Recent Labs  Lab 04/02/19 0525 04/02/19 0525 04/03/19 0755 04/03/19 0755 04/04/19 0240 04/04/19 0241 04/05/19 0905  NA 137   < > 139   < > 139 139 138  K 4.3   < > 4.1   < > 4.7 4.8 4.1   CL 103   < > 101   < > 104 103 99  CO2 22   < > 28  --  24 24  --   GLUCOSE 155*   < > 159*   < > 133* 135* 88  BUN 42*   < > 30*   < > 66* 68* 31*  CREATININE 8.04*   < > 5.32*   < > 6.50* 6.76* 4.20*  CALCIUM 7.7*   < > 7.8*  --  7.4* 7.3*  --   PHOS 3.7  --  2.3*  --  2.3*  --   --    < > = values in this interval not displayed.   Liver Function Tests: Recent Labs  Lab 04/03/19 0755 04/04/19 0240 04/04/19 0241  AST  --   --  21  ALT  --   --  11  ALKPHOS  --   --  50  BILITOT  --   --  0.4  PROT  --   --  4.3*  ALBUMIN 2.0* 1.9* 1.9*   No results for input(s): LIPASE, AMYLASE in the last 168 hours. No results for input(s): AMMONIA in the last 168 hours. CBC: Recent Labs  Lab 03/31/19 0541 03/31/19 0541 04/01/19 0708 04/01/19 0708 04/02/19 9811 04/02/19 9147 04/04/19 0241 04/04/19 0241 04/04/19 1543 04/05/19 0237 04/05/19  0905  WBC 13.2*   < > 8.7   < > 7.8  --  11.3*  --  12.8*  --   --   NEUTROABS  --   --   --   --   --   --  8.1*  --  10.4*  --   --   HGB 6.7*   < > 7.1*   < > 7.2*   < > 5.1*   < > 6.1* 8.1* 6.5*  HCT 20.3*   < > 21.6*   < > 21.9*   < > 16.2*   < > 18.2* 23.5* 19.0*  MCV 101.5*  --  101.4*  --  100.5*  --  105.9*  --  94.3  --   --   PLT 225   < > 195   < > 201  --  226  --  183  --   --    < > = values in this interval not displayed.   Cardiac Enzymes: No results for input(s): CKTOTAL, CKMB, CKMBINDEX, TROPONINI in the last 168 hours. CBG: Recent Labs  Lab 04/04/19 1636 04/04/19 2021 04/05/19 0033 04/05/19 0412 04/05/19 0759  GLUCAP 110* 135* 70 71 69*    Iron Studies: No results for input(s): IRON, TIBC, TRANSFERRIN, FERRITIN in the last 72 hours. Studies/Results: No results found.  Medications: Infusions: . sodium chloride 50 mL/hr at 04/04/19 2353  . pantoprozole (PROTONIX) infusion 8 mg/hr (04/05/19 0012)    Scheduled Medications: . sodium chloride   Intravenous Once  . atorvastatin  80 mg Oral Daily  . insulin  aspart  0-15 Units Subcutaneous TID WC  . levothyroxine  200 mcg Oral q morning - 10a  . naloxone  0.4 mg Intramuscular Once  . [START ON 04/08/2019] pantoprazole  40 mg Intravenous Q12H  . traZODone  100 mg Oral QHS    have reviewed scheduled and prn medications.  Physical Exam: General:NAD, comfortable Heart:RRR, s1s2 nl Lungs:clear b/l, no crackle Abdomen:soft, Non-tender, non-distended Extremities:No edema Dialysis Access: Right IJ TDC, left AV fistula has thrill and bruit.  Kyle [name] 04/05/2019,12:37 PM  LOS: 1 day  Pager: [phone]

## 2019-04-05 NOTE — Anesthesia Postprocedure Evaluation (Signed)
Anesthesia Post Note  Patient: Kyle [name]  Procedure(s) Performed: ESOPHAGOGASTRODUODENOSCOPY (EGD) WITH PROPOFOL (N/A ) BIOPSY     Patient location during evaluation: Endoscopy Anesthesia Type: MAC Level of consciousness: awake Pain management: pain level controlled Vital Signs Assessment: post-procedure vital signs reviewed and stable Respiratory status: spontaneous breathing Cardiovascular status: stable Postop Assessment: no apparent nausea or vomiting Anesthetic complications: no    Last Vitals:  Vitals:   04/05/19 1401 04/05/19 1436  BP: 134/74 (!) 138/59  Pulse: 82 92  Resp: 19 (!) 21  Temp: 36.5 C 36.5 C  SpO2: 98% 98%    Last Pain:  Vitals:   04/05/19 1436  TempSrc: Axillary  PainSc:                  Kenyada Hy

## 2019-04-05 NOTE — Progress Notes (Signed)
Post transfusion hemoglobin 8.1. Patient had no further GI bleed noted overnight. Spouse called and updated with time of Endoscopy. Leveda Anna, BSN, RN

## 2019-04-05 NOTE — Significant Event (Signed)
Rapid Response Event Note  Overview: Respiratory   Initial Focused Assessment: Patient returned from ENDO and upon coming back to the room, he was having periods of apnea per nursing staff. Upon my arrival, he was alert, able to converse and follow commands, no new neuro changes that I can appreciate. 100% on RA, respiratory effort is normal, SR 70s, skin warm and dry. TRH MD came to the bedside. MD ordered Narcan 0.4 mg IV x 1. Patient endorse that he snores and that he has a history of COPD. Perhaps patient has some undiagnosed sleep apnea  Interventions: - Narcan 0.4mg  x 1  Plan of Care: - CPAP when napping and at bedtime  - Rest per MD  Event Summary:  Call Time 1101 Arrival Time 1104 End Time [place], [place]

## 2019-04-05 NOTE — Anesthesia Preprocedure Evaluation (Addendum)
Anesthesia Evaluation  Patient identified by MRN, date of birth, ID band Patient awake    Reviewed: Allergy & Precautions, NPO status , Patient's Chart, lab work & pertinent test results  Airway Mallampati: II  TM Distance: >3 FB     Dental   Pulmonary COPD, Current Smoker and Patient abstained from smoking.,    breath sounds clear to auscultation       Cardiovascular hypertension, + Peripheral Vascular Disease   Rhythm:Regular Rate:Normal     Neuro/Psych Seizures -,  PSYCHIATRIC DISORDERS  Neuromuscular disease CVA    GI/Hepatic negative GI ROS, Neg liver ROS,   Endo/Other  diabetesHypothyroidism   Renal/GU Renal disease     Musculoskeletal  (+) Arthritis ,   Abdominal   Peds  Hematology  (+) anemia ,   Anesthesia Other Findings   Reproductive/Obstetrics                             Anesthesia Physical Anesthesia Plan  ASA: III  Anesthesia Plan: MAC   Post-op Pain Management:    Induction: Intravenous  PONV Risk Score and Plan: 1 and Ondansetron  Airway Management Planned: Simple Face Mask and Nasal Cannula  Additional Equipment:   Intra-op Plan:   Post-operative Plan:   Informed Consent: I have reviewed the patients History and Physical, chart, labs and discussed the procedure including the risks, benefits and alternatives for the proposed anesthesia with the patient or authorized representative who has indicated his/her understanding and acceptance.     Dental advisory given  Plan Discussed with: CRNA and Anesthesiologist  Anesthesia Plan Comments:         Anesthesia Quick Evaluation

## 2019-04-05 NOTE — Brief Op Note (Signed)
EGD negative for blood products or active bleeding. Severe duodenitis seen and biopsies taken. Moderate gastritis. No ulcers seen. Clear liquid diet. Transfuse 2 U PRBCs. Continue Protonix drip. GI will f/u tomorrow.

## 2019-04-05 NOTE — Plan of Care (Signed)

## 2019-04-05 NOTE — Interval H&P Note (Signed)
History and Physical Interval Note:  04/05/2019 8:49 AM  Kyle [name]  has presented today for surgery, with the diagnosis of Melena; GI bleed.  The various methods of treatment have been discussed with the patient and family. After consideration of risks, benefits and other options for treatment, the patient has consented to  Procedure(s): ESOPHAGOGASTRODUODENOSCOPY (EGD) WITH PROPOFOL (N/A) as a surgical intervention.  The patient's history has been reviewed, patient examined, no change in status, stable for surgery.  I have reviewed the patient's chart and labs.  Questions were answered to the patient's satisfaction.     [name]

## 2019-04-06 ENCOUNTER — Inpatient Hospital Stay (HOSPITAL_COMMUNITY): Payer: Medicare PPO | Admitting: Physical Therapy

## 2019-04-06 ENCOUNTER — Inpatient Hospital Stay (HOSPITAL_COMMUNITY): Payer: Medicare PPO | Admitting: Occupational Therapy

## 2019-04-06 ENCOUNTER — Inpatient Hospital Stay (HOSPITAL_COMMUNITY): Payer: Medicare PPO | Admitting: Speech Pathology

## 2019-04-06 LAB — BPAM RBC
Blood Product Expiration Date: 202102112359
Blood Product Expiration Date: 202102112359
Blood Product Expiration Date: 202103082359
Blood Product Expiration Date: 202103082359
Blood Product Expiration Date: 202103092359
Blood Product Expiration Date: 202103092359
ISSUE DATE / TIME: 202102060501
ISSUE DATE / TIME: 202102061035
ISSUE DATE / TIME: 202102061702
ISSUE DATE / TIME: 202102062051
ISSUE DATE / TIME: 202102071122
ISSUE DATE / TIME: 202102071413
Unit Type and Rh: 5100
Unit Type and Rh: 5100
Unit Type and Rh: 5100
Unit Type and Rh: 5100
Unit Type and Rh: 9500
Unit Type and Rh: 9500

## 2019-04-06 LAB — TYPE AND SCREEN
ABO/RH(D): O POS
Antibody Screen: NEGATIVE
Unit division: 0
Unit division: 0
Unit division: 0
Unit division: 0
Unit division: 0
Unit division: 0

## 2019-04-06 LAB — GLUCOSE, CAPILLARY
Glucose-Capillary: 105 mg/dL — ABNORMAL HIGH (ref 70–99)
Glucose-Capillary: 138 mg/dL — ABNORMAL HIGH (ref 70–99)
Glucose-Capillary: 66 mg/dL — ABNORMAL LOW (ref 70–99)
Glucose-Capillary: 71 mg/dL (ref 70–99)
Glucose-Capillary: 75 mg/dL (ref 70–99)
Glucose-Capillary: 80 mg/dL (ref 70–99)
Glucose-Capillary: 83 mg/dL (ref 70–99)
Glucose-Capillary: 85 mg/dL (ref 70–99)
Glucose-Capillary: 99 mg/dL (ref 70–99)

## 2019-04-06 LAB — CBC WITH DIFFERENTIAL/PLATELET
Abs Immature Granulocytes: 0.09 10*3/uL — ABNORMAL HIGH (ref 0.00–0.07)
Basophils Absolute: 0.1 10*3/uL (ref 0.0–0.1)
Basophils Relative: 1 %
Eosinophils Absolute: 0.2 10*3/uL (ref 0.0–0.5)
Eosinophils Relative: 2 %
HCT: 27.2 % — ABNORMAL LOW (ref 39.0–52.0)
Hemoglobin: 9 g/dL — ABNORMAL LOW (ref 13.0–17.0)
Immature Granulocytes: 1 %
Lymphocytes Relative: 14 %
Lymphs Abs: 0.9 10*3/uL (ref 0.7–4.0)
MCH: 30.8 pg (ref 26.0–34.0)
MCHC: 33.1 g/dL (ref 30.0–36.0)
MCV: 93.2 fL (ref 80.0–100.0)
Monocytes Absolute: 0.5 10*3/uL (ref 0.1–1.0)
Monocytes Relative: 7 %
Neutro Abs: 5.2 10*3/uL (ref 1.7–7.7)
Neutrophils Relative %: 75 %
Platelets: 124 10*3/uL — ABNORMAL LOW (ref 150–400)
RBC: 2.92 MIL/uL — ABNORMAL LOW (ref 4.22–5.81)
RDW: 16.6 % — ABNORMAL HIGH (ref 11.5–15.5)
WBC: 6.9 10*3/uL (ref 4.0–10.5)
nRBC: 0 % (ref 0.0–0.2)

## 2019-04-06 LAB — BASIC METABOLIC PANEL
Anion gap: 10 (ref 5–15)
BUN: 46 mg/dL — ABNORMAL HIGH (ref 8–23)
CO2: 22 mmol/L (ref 22–32)
Calcium: 6.6 mg/dL — ABNORMAL LOW (ref 8.9–10.3)
Chloride: 106 mmol/L (ref 98–111)
Creatinine, Ser: 5.78 mg/dL — ABNORMAL HIGH (ref 0.61–1.24)
GFR calc Af Amer: 11 mL/min — ABNORMAL LOW (ref >60)
GFR calc non Af Amer: 9 mL/min — ABNORMAL LOW (ref >60)
Glucose, Bld: 104 mg/dL — ABNORMAL HIGH (ref 70–99)
Potassium: 4.3 mmol/L (ref 3.5–5.1)
Sodium: 138 mmol/L (ref 135–145)

## 2019-04-06 MED ORDER — LEVETIRACETAM IN NACL 500 MG/100ML IV SOLN
500.0000 mg | Freq: Two times a day (BID) | INTRAVENOUS | Status: DC
  Administered 2019-04-06 – 2019-04-10 (×7): 500 mg via INTRAVENOUS
  Filled 2019-04-06 (×7): qty 100

## 2019-04-06 MED ORDER — RENA-VITE PO TABS
1.0000 | ORAL_TABLET | Freq: Every day | ORAL | Status: DC
  Administered 2019-04-06 – 2019-04-15 (×10): 1 via ORAL
  Filled 2019-04-06 (×10): qty 1

## 2019-04-06 MED ORDER — CHLORHEXIDINE GLUCONATE CLOTH 2 % EX PADS
6.0000 | MEDICATED_PAD | Freq: Every day | CUTANEOUS | Status: DC
  Administered 2019-04-06 – 2019-04-16 (×8): 6 via TOPICAL

## 2019-04-06 MED ORDER — DARBEPOETIN ALFA 100 MCG/0.5ML IJ SOSY
100.0000 ug | PREFILLED_SYRINGE | INTRAMUSCULAR | Status: DC
  Administered 2019-04-11: 100 ug via INTRAVENOUS
  Filled 2019-04-06: qty 0.5

## 2019-04-06 MED ORDER — CHLORHEXIDINE GLUCONATE 0.12 % MT SOLN
15.0000 mL | Freq: Two times a day (BID) | OROMUCOSAL | Status: DC
  Administered 2019-04-06 – 2019-04-16 (×11): 15 mL via OROMUCOSAL
  Filled 2019-04-06 (×15): qty 15

## 2019-04-06 NOTE — Progress Notes (Signed)
Pt refusing bipap for the night. ?

## 2019-04-06 NOTE — Progress Notes (Signed)
Pt had another loose, bloody BM. RN was able to measure 100 mL, in addition to an unmeasured amount on the bed pad. On-call MD notified. No new orders received. Will continue to monitor.   Gailen Shelter RN

## 2019-04-06 NOTE — Plan of Care (Signed)

## 2019-04-06 NOTE — Op Note (Signed)
[facility] Patient Name: Kyle [name] Procedure Date : 04/05/2019 MRN: [id number] Attending MD: [name] , MD Date of Birth: [birth year] CSN: [id number] Age: 69 Admit Type: Inpatient Procedure:                Upper GI endoscopy Indications:              Active gastrointestinal bleeding, Coffee-ground                            emesis, Melena Providers:                [name], MD, Burtis Junes, RN, Marguerita Merles, Technician Referring MD:             hospital team Medicines:                Propofol per Anesthesia, Monitored Anesthesia Care Complications:            No immediate complications. Estimated Blood Loss:     Estimated blood loss was minimal. Procedure:                Pre-Anesthesia Assessment:                           - Prior to the procedure, a History and Physical                            was performed, and patient medications and                            allergies were reviewed. The patient's tolerance of                            previous anesthesia was also reviewed. The risks                            and benefits of the procedure and the sedation                            options and risks were discussed with the patient.                            All questions were answered, and informed consent                            was obtained. Prior Anticoagulants: The patient has                            taken no previous anticoagulant or antiplatelet                            agents. ASA Grade Assessment: IV - A patient with  severe systemic disease that is a constant threat                            to life. After reviewing the risks and benefits,                            the patient was deemed in satisfactory condition to                            undergo the procedure.                           After obtaining informed consent, the endoscope was      passed under direct vision. Throughout the                            procedure, the patient's blood pressure, pulse, and                            oxygen saturations were monitored continuously. The                            GIF-H190 ([id number]) Olympus gastroscope was                            introduced through the mouth, and advanced to the                            second part of duodenum. The upper GI endoscopy was                            accomplished without difficulty. The patient                            tolerated the procedure well. Scope In: Scope Out: Findings:      The examined esophagus was normal.      The Z-line was regular and was found 42 cm from the incisors.      Localized moderate inflammation characterized by congestion (edema),       erosions and erythema was found in the prepyloric region of the stomach.      The cardia and gastric fundus were normal on retroflexion.      Patchy severe mucosal changes characterized by congestion, erythema,       erosion and nodularity were found in the duodenal bulb. Biopsies were       taken with a cold forceps for histology. Estimated blood loss was       minimal.      The second portion of the duodenum was normal. Impression:               - Normal esophagus.                           - Z-line regular, 42 cm from the incisors.                           -  Gastritis.                           - Mucosal changes in the duodenum. Biopsied.                           - Normal second portion of the duodenum. Recommendation:           - Clear liquid diet.                           - Observe patient's clinical course.                           - Await pathology results. Procedure Code(s):        --- Professional ---                           [phone], Esophagogastroduodenoscopy, flexible,                            transoral; with biopsy, single or multiple Diagnosis Code(s):        --- Professional ---                            K92.0, Hematemesis                           K92.2, Gastrointestinal hemorrhage, unspecified                           K92.1, Melena (includes Hematochezia)                           K29.70, Gastritis, unspecified, without bleeding                           K31.89, Other diseases of stomach and duodenum CPT copyright 2019 [employer]. All rights reserved. The codes documented in this report are preliminary and upon coder review may  be revised to meet current compliance requirements. [name], MD 04/05/2019 9:51:47 AM This report has been signed electronically. Number of Addenda: 0

## 2019-04-06 NOTE — Progress Notes (Signed)
[facility] Progress Note    Assessment/ Plan:    ESRD: Transitioned from PD to HD on 2/2, last session 2/6.  RUE AVF placed on 2/1.  Awaiting outpatient HD placement, plan for next HD 2/9, will hold heparin w/ session.    Anemia of CKD   Suspected Blood loss anemia: Hgb 9.0 on 2/8, s/p 4 pRBCs, will monitor fluid status closely.  Plan per GI, no active bleeding seen in EGD. Considering tagged RBC scan.  Receiving Aranesp weekly.   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    Hypertension: Stable. On metoprolol, holding norvasc in the setting of probable blood loss.    Nutrition: Albumin 1.9. Renavit MVI daily.    Acute Pontine Stroke: Continued rehabilitation.   T2DM  Hypothyroidism: Per primary.    Subjective:   No acute events overnight.  Wishes to be left alone this morning.  Melena reported last night.    Objective:   BP (!) 134/56 (BP Location: Right Arm)   Pulse 71   Temp (!) 96.8 F (36 C) (Axillary)   Resp (!) 22   SpO2 99%   Intake/Output Summary (Last 24 hours) at 04/06/2019 8546 Last data filed at 04/06/2019 0700 Gross per 24 hour  Intake 551 ml  Output 0 ml  Net 551 ml   Weight change:   Physical Exam: General: NAD, sleeping comfortably  Cardiac: RRR Lungs: Clear bilaterally, no increased WOB  Abdomen: soft Ext: Warm, dry, no BLE edema Neuro/psych: Sleeping, able to arouse easily with voice.  Following commands appropriately, moves all extremities spontaneously. Access: Right IJ tunneled catheter in LUE aVF with palpable thrill/audible bruit  Imaging: No results found.  Labs: BMET Recent Labs  Lab 03/31/19 0541 04/01/19 0708 04/02/19 0525 04/03/19 0755 04/04/19 0240 04/04/19 0241 04/05/19 0905  NA 133* 136 137 139 139 139 138  K 4.4 3.9 4.3 4.1 4.7 4.8 4.1  CL 96* 97* 103 101 104 103 99  CO2 22 25 22 28 24 24   --   GLUCOSE 307* 140* 155* 159* 133* 135* 88  BUN 56* 34* 42* 30* 66* 68* 31*   CREATININE 9.17* 6.46* 8.04* 5.32* 6.50* 6.76* 4.20*  CALCIUM 7.8* 7.4* 7.7* 7.8* 7.4* 7.3*  --   PHOS 4.4 3.4 3.7 2.3* 2.3*  --   --    CBC Recent Labs  Lab 04/01/19 0708 04/01/19 0708 04/02/19 0629 04/02/19 0629 04/04/19 0241 04/04/19 0241 04/04/19 1543 04/05/19 0237 04/05/19 0905 04/05/19 2035  WBC 8.7  --  7.8  --  11.3*  --  12.8*  --   --   --   NEUTROABS  --   --   --   --  8.1*  --  10.4*  --   --   --   HGB 7.1*   < > 7.2*   < > 5.1*   < > 6.1* 8.1* 6.5* 9.4*  HCT 21.6*   < > 21.9*   < > 16.2*   < > 18.2* 23.5* 19.0* 27.2*  MCV 101.4*  --  100.5*  --  105.9*  --  94.3  --   --   --   PLT 195  --  201  --  226  --  183  --   --   --    < > = values in this interval not displayed.    Medications:    . sodium chloride   Intravenous Once  .  atorvastatin  80 mg Oral Daily  . chlorhexidine  15 mL Mouth Rinse BID  . Chlorhexidine Gluconate Cloth  6 each Topical Daily  . insulin aspart  0-15 Units Subcutaneous TID WC  . levETIRAcetam  500 mg Oral BID  . levothyroxine  200 mcg Oral q morning - 10a  . naloxone  0.4 mg Intramuscular Once  . [START ON 04/08/2019] pantoprazole  40 mg Intravenous Q12H  . traZODone  100 mg Oral QHS      Darrelyn Hillock, DO Family Medicine PGY-2  04/06/2019, 8:29 AM

## 2019-04-06 NOTE — Progress Notes (Signed)
Lab staff attempted to draw am labs, came to RN stating that pt was refusing. RN entered the room and asked why the pt was refusing to have labs drawn. Pt just stated that he didn't want them drawn right now and to leave him alone. RN explained the importance of these labs as it pertains to his GI bleed, but pt still refused. Pt also refused am medication.  Gailen Shelter RN

## 2019-04-06 NOTE — Progress Notes (Signed)
UNASSIGNED PATIENT Subjective: [name] is a 69 year old white male with a history of end-stage renal disease on peritoneal dialysis who was transitioned to hemodialysis on 03/31/2019. He has had a stroke in the past and was in rehab following a stroke.  He was admitted to rehab on 03/20/2019 on aspirin and Plavix. and the plans were to move him to skilled nursing facility when he developed hypotension with black and maroon stools overnight and coffee-ground emesis.  An EGD done over the weekend revealed severe duodenitis with some gastritis but patient has not had any further bleeding since the procedure.  He is also had a colonoscopy in 2020 in [facility] with adenomatous polyps and a tubulovillous adenoma with removed and extensive diverticulosis was noted.  Patient denies having any abdominal pain nausea vomiting.  He has not had a bowel movement today and there is been no evidence of hematochezia or melena  Objective: Vital signs in last 24 hours: Temp:  [96.8 F (36 C)-98.7 F (37.1 C)] 97.8 F (36.6 C) (02/08 1150) Pulse Rate:  [71-92] 77 (02/08 1150) Resp:  [8-22] 18 (02/08 1150) BP: (117-149)/(56-77) 149/70 (02/08 1150) SpO2:  [98 %-100 %] 100 % (02/08 1150) Last BM Date: 04/05/19  Intake/Output from previous day: 02/07 0701 - 02/08 0700 In: 551 [I.V.:100; Blood:451] Out: 0  Intake/Output this shift: No intake/output data recorded.  General appearance: cooperative, appears stated age and no distress HEENT: ATNC, patient has ptosis of the left eyelid Neck supple Chest clear to auscultation S1-S2 regular abdomen soft with normal abdominal bowel sounds no hepatosplenomegaly.  Lab Results: Recent Labs    04/04/19 0241 04/04/19 0241 04/04/19 1543 04/05/19 0237 04/05/19 0905 04/05/19 2035 04/06/19 0833  WBC 11.3*  --  12.8*  --   --   --  6.9  HGB 5.1*   < > 6.1*   < > 6.5* 9.4* 9.0*  HCT 16.2*   < > 18.2*   < > 19.0* 27.2* 27.2*  PLT 226  --  183  --    --   --  124*   < > = values in this interval not displayed.   BMET Recent Labs    04/04/19 0240 04/04/19 0240 04/04/19 0241 04/05/19 0905 04/06/19 0833  NA 139   < > 139 138 138  K 4.7   < > 4.8 4.1 4.3  CL 104   < > 103 99 106  CO2 24  --  24  --  22  GLUCOSE 133*   < > 135* 88 104*  BUN 66*   < > 68* 31* 46*  CREATININE 6.50*   < > 6.76* 4.20* 5.78*  CALCIUM 7.4*  --  7.3*  --  6.6*   < > = values in this interval not displayed.   LFT Recent Labs    04/04/19 0241  PROT 4.3*  ALBUMIN 1.9*  AST 21  ALT 11  ALKPHOS 50  BILITOT 0.4   Medications: I have reviewed the patient's current medications.  Assessment/Plan: 1) Rectal bleeding/melena on aspirin and Plavix with severe duodenitis noted on an EGD along with gastritis.  No visible vessel noted. Patient is on a Protonix drip which can probably be converted to PO in a couple of days.  I am not exactly sure whether the bleeding was from duodenitis.  Melena and rectal bleeding could be from right-sided diverticulosis as well.  We will need to monitor patient's symptoms closely further recommendations made as needed.  Hemoglobin is 9 g/dL today.  If the patient has significant amount of bleeding a CT angiogram would be more helpful than the tagged red blood scan. 2) Pandiverticulosis multiple colonic polyps. 3) S/P stroke-left pontine stroke/seizure disorder on Keppra.[name] 3) Hypertension/hyperlipidemia. 4) ESRD on HD.[name] 5) AODM/hypothyroidism. 6) BPH. 7) COPD. . LOS: 2 days   Juanita Craver 04/06/2019, 2:20 PM

## 2019-04-06 NOTE — Progress Notes (Signed)
Rapid Response Rounding Note  Overview: Per Mika, RN, pt was on the phone with his wife earlier and his wife believed to hear a change in his speech. RN went in to assess pt and didn't notice a change in his speech. Attending MD was notified by primary RN regarding wife's concern of change in pt's speech. I entered room to find pt awake and alert. NIH completed with a score of 1 for dysarthria. Pt speech is intelligible, but some words are hard to understand. Per previous charting of NIH, pt has scored a 1 for dysarthria in the past. Based on primary RN's assessment, this does not appear to be an acute change. RN to follow up with MD for further orders.   RN to call rapid response for additional needs.   Casimer Bilis

## 2019-04-06 NOTE — Care Management (Signed)
   The overall goal for the admission was met for:   Discharge location: Disharge to acute  Length of Stay: 15 days with discharge to acute 04/04/19  Discharge activity level: Pt is limited by poor endurance, poor midline orientation, poor awareness of situation and short term memory, but has progressed to min assist overall for bed mobility, Transfers with RW and gait with R up to 42f. Can propell WC with supervision assist and increased time for safety problem solving delays.   Home/community participation: LResearch officer, political party wFirefighterprovided included: MD, RD, PT, OT, SLP, RN, CM, Pharmacy, Neuropsych and SW  Financial Services: Medicare  Comments (or additional information): Plan to discharge to SNF short term from rehab and then return home. Colleen to arrange for clip once SNF determined.  Patient/Family verbalized understanding of follow-up arrangements: Yes; [place]; [facility] to [facility] from [facility] short term due to stroke and cognition issues. Wife does not feel comfortable managing PD.  Individual responsible for coordination of the follow-up plan: MChanler [phone] and wife BInez [phone]  SMargarito Liner

## 2019-04-06 NOTE — Progress Notes (Signed)
PROGRESS NOTE    Kyle [name]  MRN:[id number] DOB: 01/17/1951 DOA: 04/04/2019 PCP: Sparks, Jeffrey D, MD   Brief Narrative:  Patient was discharged from [facility] on 1/21 to CIR at [facility] after being treated for acute left midbrain stroke. He subsequently developed GI bleed  (acute onset of black and red stools X 5 overnight and also vomited coffee grounds one time while at rehab) and acute blood loss anemia and is admitted to [facility] on 2/6   Nephrology and GI following  Assessment & Plan:   Active Problems:   Chronic obstructive pulmonary disease (HCC)   Diabetes mellitus (HCC)   Insulin dependent type 2 diabetes mellitus (HCC)   ESRD (end stage renal disease) (HCC)   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HCC)   GI bleed   Acute upper GI bleed  Apnea -post EGD, responded to narcan -Narcan prn -OSA, bipap QHS and prn  GI bleed/blood loss anemia ( reason for admission) S/p EGD no bleeding source find in EGD with duodenitis and gastritis, patient has a colonoscopy in 11/2018 "adenomatous polyps and a tubulovillous adenoma were removed and extensive diverticulosis was noted" He denies ab pain Getting 2prbc 04/05/19, appropriate increase to Hg 9 today Per nursing 2 further bloody BM yesterday, he denies bm today GI recommend continue PPI drip through 04/07/19 then transition to IV BID PPI - tagged RBC scan if rebleed -GI will follow  HTN: Currently bp low normal, Hold home bp meds including  norvasc,  cozaar Change toprol-xl to low dose lopressor with holding parameters  CVA with difficulty with balance and ptosis of left eyelid. He was on asa/plavix for three wks then asa alone -mental status not very cooperative today will monitor closely, Hg down to nadir 5.1 during admission Hold asa in the setting of gi bleed, continue Statin  Seizure disorder on keppra chronically  -refused meds this morning, will change to IV temporarily to avoid seizures  Insulin dependent DM2 He was  discharged on insulin (lantus and ssi) from recent hospitalization he is on ssi for now  ESRD was on peritoneal dialysis transitioned to HD on 2/2 Status post RIJ TDC and left BCAVF placed by Dr. Clark 2/1. Case discussed with nephrology Dr Bhandari who will see patient in consult, plan per nephrology, nephrology input appreciated  COPD, not home o2 dependent,  No wheezing on exam, no hypoxia  Chronic RBBB EKG stable   Hypothyroidism, continue Synthroid  DVT prophylaxis: SCD Code Status: Full Family Communication: talked to wife [name] today and updated on situation Disposition Plan:  . Patient came from: CIR           . Anticipated d/c place: uncertain at this time . Barriers to d/c OR conditions which need to be met to effect a safe d/c: needs stable Hg and finish PPI drip (this is through 04/07/19), for further bleeding needs tagged RBC scan per GI   Consultants:   GI  Nephrology  Procedures:  HD due again 04/07/19  Antimicrobials:  none  Subjective: Patient does not want to speak to me this morning. Refused morning labs and meds. Per nursing staff 2 more bloody BM yesterday afternoon/evening with significant amount of blood. Got 2 units PRBC yesterday.  When seen again around 1230 he is able to answer questions but sleepy, did agree to morning labs which are fairly stable. Not confused but sleepy.    Objective: Vitals:   04/05/19 2300 04/06/19 0013 04/06/19 0405 04/06/19 0735  BP: 133/61  117/65 (!)   134/56  Pulse: 82 78 78 71  Resp: 15 (!) 8 19 (!) 22  Temp: 98.7 F (37.1 C)  98.5 F (36.9 C) (!) 96.8 F (36 C)  TempSrc: Oral  Oral Axillary  SpO2: 100% 99% 99% 99%    Intake/Output Summary (Last 24 hours) at 04/06/2019 0946 Last data filed at 04/06/2019 0852 Gross per 24 hour  Intake 451 ml  Output 0 ml  Net 451 ml   There were no vitals filed for this visit.  Examination:  General exam: Appears sleeping, asks me to leave Respiratory system: Clear to  auscultation. Respiratory effort poor Cardiovascular system: S1 & S2 heard, RRR. No pedal edema. Gastrointestinal system: Abdomen is nondistended, soft and nontender.Normal bowel sounds heard. Central nervous system: Sleepy but oriented, no focal neurological deficits. Extremities: did not wish to participate, moves all 4 extremities Skin: No rashes, lesions or ulcers Psychiatry: Judgement and insight appear normal. Mood & affect appropriate.  Data Reviewed: I have personally reviewed following labs and imaging studies  CBC: Recent Labs  Lab 04/01/19 0708 04/01/19 0708 04/02/19 0629 04/02/19 0629 04/04/19 0241 04/04/19 0241 04/04/19 1543 04/05/19 0237 04/05/19 0905 04/05/19 2035 04/06/19 0833  WBC 8.7  --  7.8  --  11.3*  --  12.8*  --   --   --  6.9  NEUTROABS  --   --   --   --  8.1*  --  10.4*  --   --   --  5.2  HGB 7.1*   < > 7.2*   < > 5.1*   < > 6.1* 8.1* 6.5* 9.4* 9.0*  HCT 21.6*   < > 21.9*   < > 16.2*   < > 18.2* 23.5* 19.0* 27.2* 27.2*  MCV 101.4*  --  100.5*  --  105.9*  --  94.3  --   --   --  93.2  PLT 195  --  201  --  226  --  183  --   --   --  124*   < > = values in this interval not displayed.   Basic Metabolic Panel: Recent Labs  Lab 03/31/19 0541 03/31/19 0541 04/01/19 0708 04/01/19 0708 04/02/19 0525 04/03/19 0755 04/04/19 0240 04/04/19 0241 04/05/19 0905  NA 133*   < > 136   < > 137 139 139 139 138  K 4.4   < > 3.9   < > 4.3 4.1 4.7 4.8 4.1  CL 96*   < > 97*   < > 103 101 104 103 99  CO2 22   < > 25  --  22 28 24 24  --   GLUCOSE 307*   < > 140*   < > 155* 159* 133* 135* 88  BUN 56*   < > 34*   < > 42* 30* 66* 68* 31*  CREATININE 9.17*   < > 6.46*   < > 8.04* 5.32* 6.50* 6.76* 4.20*  CALCIUM 7.8*   < > 7.4*  --  7.7* 7.8* 7.4* 7.3*  --   PHOS 4.4  --  3.4  --  3.7 2.3* 2.3*  --   --    < > = values in this interval not displayed.   GFR: Estimated Creatinine Clearance: 19.8 mL/min (A) (by C-G formula based on SCr of 4.2 mg/dL (H)). Liver  Function Tests: Recent Labs  Lab 04/01/19 0708 04/02/19 0525 04/03/19 0755 04/04/19 0240 04/04/19 0241  AST  --   --   --   --    21  ALT  --   --   --   --  11  ALKPHOS  --   --   --   --  50  BILITOT  --   --   --   --  0.4  PROT  --   --   --   --  4.3*  ALBUMIN 1.8* 1.9* 2.0* 1.9* 1.9*   No results for input(s): LIPASE, AMYLASE in the last 168 hours. No results for input(s): AMMONIA in the last 168 hours. Coagulation Profile: No results for input(s): INR, PROTIME in the last 168 hours. Cardiac Enzymes: No results for input(s): CKTOTAL, CKMB, CKMBINDEX, TROPONINI in the last 168 hours. BNP (last 3 results) No results for input(s): PROBNP in the last 8760 hours. HbA1C: No results for input(s): HGBA1C in the last 72 hours. CBG: Recent Labs  Lab 04/05/19 1541 04/05/19 2025 04/06/19 0001 04/06/19 0417 04/06/19 0733  GLUCAP 112* 141* 80 105* 71   Lipid Profile: No results for input(s): CHOL, HDL, LDLCALC, TRIG, CHOLHDL, LDLDIRECT in the last 72 hours. Thyroid Function Tests: No results for input(s): TSH, T4TOTAL, FREET4, T3FREE, THYROIDAB in the last 72 hours. Anemia Panel: No results for input(s): VITAMINB12, FOLATE, FERRITIN, TIBC, IRON, RETICCTPCT in the last 72 hours. Sepsis Labs: No results for input(s): PROCALCITON, LATICACIDVEN in the last 168 hours.  Recent Results (from the past 240 hour(s))  Surgical pcr screen     Status: None   Collection Time: 04/04/19  8:22 PM   Specimen: Nasal Mucosa; Nasal Swab  Result Value Ref Range Status   MRSA, PCR NEGATIVE NEGATIVE Final   Staphylococcus aureus NEGATIVE NEGATIVE Final    Comment: (NOTE) The Xpert SA Assay (FDA approved for NASAL specimens in patients 75 years of age and older), is one component of a comprehensive surveillance program. It is not intended to diagnose infection nor to guide or monitor treatment. Performed at [facility], [place] [address], [place], [place] [address]     Radiology  Studies: No results found.  Scheduled Meds: . sodium chloride   Intravenous Once  . atorvastatin  80 mg Oral Daily  . chlorhexidine  15 mL Mouth Rinse BID  . Chlorhexidine Gluconate Cloth  6 each Topical Daily  . [START ON 04/11/2019] darbepoetin (ARANESP) injection - DIALYSIS  100 mcg Intravenous Q Sat-HD  . insulin aspart  0-15 Units Subcutaneous TID WC  . levETIRAcetam  500 mg Oral BID  . levothyroxine  200 mcg Oral q morning - 10a  . multivitamin  1 tablet Oral QHS  . naloxone  0.4 mg Intramuscular Once  . [START ON 04/08/2019] pantoprazole  40 mg Intravenous Q12H  . traZODone  100 mg Oral QHS   Continuous Infusions: . pantoprozole (PROTONIX) infusion 8 mg/hr (04/06/19 0351)     LOS: 2 days   Time spent: 25  Hoyt Koch, MD [facility]  To contact the attending provider between 7A-7P or the covering provider during after hours 7P-7A, please log into the web site [email or web address] and access using universal [place] password for that web site. If you do not have the password, please call the hospital operator.  04/06/2019, 9:46 AM

## 2019-04-06 NOTE — Discharge Summary (Signed)
Physician Discharge Summary  Patient ID: Kyle [name] MRN: [id number] DOB/AGE: December 09, 1950 69 y.o.  Admit date: 03/20/2019 Discharge date: 04/04/2019  Discharge Diagnoses:  GI bleed. Active Problems:   Diabetes mellitus (HCC)   BP (high blood pressure)   Seizure (HCC)   ESRD (end stage renal disease) (HCC)   Chronic kidney disease with peritoneal dialysis as preferred modality, stage 5 (HCC)   PVD (peripheral vascular disease) ([place])   Left pontine stroke [facility])    Discharged Condition: Critical   Significant Diagnostic Studies: X-ray chest PA or AP  Result Date: 03/30/2019 CLINICAL DATA:  Postop right dialysis catheter EXAM: CHEST  1 VIEW COMPARISON:  CT chest dated 03/18/2019 FINDINGS: Mild patchy left lower lobe opacity, new, likely atelectasis. Right lung is clear. No pleural effusion or pneumothorax. The heart is top-normal in size. Right IJ dual lumen dialysis catheter terminates in the lower SVC. Old left posterior rib fracture deformity. IMPRESSION: Right IJ dual lumen dialysis catheter terminates in the lower SVC. Mild patchy left lower lobe opacity, likely atelectasis. Electronically Signed   By: Julian Hy M.D.   On: 03/30/2019 09:51   CT HEAD WO CONTRAST  Result Date: 03/26/2019 CLINICAL DATA:  Follow-up stroke.  Found on the floor. EXAM: CT HEAD WITHOUT CONTRAST TECHNIQUE: Contiguous axial images were obtained from the base of the skull through the vertex without intravenous contrast. COMPARISON:  MRI 03/15/2019.  CT 03/17/2019. FINDINGS: Brain: Low-density remains visible at the site of recent infarction of the left paramedian mid brain. Old infarction of the left pons as seen previously. No evidence of hemorrhage or swelling. Old small vessel cerebellar infarctions. Chronic small-vessel ischemic changes of the cerebral hemispheric white matter. No sign of additional infarction. No hydrocephalus or extra-axial collection. Vascular: There is atherosclerotic  calcification of the major vessels at the base of the brain. Skull: Negative Sinuses/Orbits: Chronic opacification of the left maxillary sinus with mucoid material. Fungal sinusitis not excluded. Other: None IMPRESSION: No new finding. Of all vein changes of recent infarction of the left paramedian mid brain. No hemorrhage. Old ischemic changes elsewhere as noted above. Left maxillary sinus disease which could be chronic sinusitis with inspissated mucus or fungal sinusitis. Electronically Signed   By: Nelson Chimes M.D.   On: 03/26/2019 09:17    DG Swallowing Func-Speech Pathology  Result Date: 04/02/2019 Objective Swallowing Evaluation: Type of Study: MBS-Modified Barium Swallow Study  Patient Details Name: Kyle [name] MRN: [id number] Date of Birth: [birth year] Today's Date: 04/02/2019 Time: SLP Start Time (ACUTE ONLY): 5009 -SLP Stop Time (ACUTE ONLY): 3818 SLP Time Calculation (min) (ACUTE ONLY): 24 min Past Medical History: Past Medical History: Diagnosis Date . Anxiety  . BPH (benign prostatic hyperplasia)  . Chronic kidney disease  . Colon polyps  . COPD (chronic obstructive pulmonary disease) ([place])  . Depression  . Diabetes mellitus without complication ([place])  . Diverticulosis of colon  . Hyperlipidemia  . Hypertension  . Hypothyroidism  . Nephrolithiasis  . Seizures ([place])  Past Surgical History: Past Surgical History: Procedure Laterality Date . AV FISTULA PLACEMENT Left 03/30/2019  Procedure: right brachiocephalic fistula creation;  Surgeon: Marty Heck, MD;  Location: [place];  Service: Vascular;  Laterality: Left; . COLONOSCOPY   . COLONOSCOPY WITH PROPOFOL N/A 05/30/2015  Procedure: COLONOSCOPY WITH PROPOFOL;  Surgeon: Manya Silvas, MD;  Location[facility] ENDOSCOPY;  Service: Endoscopy;  Laterality: N/A; . COLONOSCOPY WITH PROPOFOL N/A 12/01/2018  Procedure: COLONOSCOPY WITH PROPOFOL;  Surgeon: Alice Reichert, [name], MD;  Location: [facility] ENDOSCOPY;  Service: Gastroenterology;  Laterality: N/A; .  INSERTION OF DIALYSIS CATHETER Right 03/30/2019  Procedure: Ultrasound guided right internal jugular tunneled dialysis catheter placement;  Surgeon: Marty Heck, MD;  Location: [facility] OR;  Service: Vascular;  Laterality: Right; . kidney stone   . THYROIDECTOMY   . VARICOCELE EXCISION   . VIDEO BRONCHOSCOPY Bilateral 05/25/2016  Procedure: VIDEO BRONCHOSCOPY WITHOUT FLUORO;  Surgeon: Juanito Doom, MD;  Location: [facility] ENDOSCOPY;  Service: Cardiopulmonary;  Laterality: Bilateral; HPI: Pt is a 69 y/o male admitted for hypocalcemia. Pt has PMH to include Multiple medical issues including ESRD on peritoneal dialysis daily but has "missed" sessions per pt report, COPD, HTN, and seizures, Tobacco and ETOH use, Lung R Nodule, collapse of R lung. Pt currently receiving HHPT, however has been declining physically and reports multiple falls. 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 Chronic small-vessel ischemic changes elsewhere throughout the brain.  Prior to admission per chart notes, pt reports that he has been feeling very lightheaded and weak for about the past 24 hours.  He had an episode earlier today where he was eating at a restaurant and suddenly felt very shaky, " like I was going to have a seizure".  He reports his last seizure being over 5 years ago and did not end up having a seizure episode today, states he has been compliant with his Keppra.  He had an appointment with his GI doctor later in the day for chronic diarrhea and had lab work performed at that time.  He then received a call this evening that his Calcium was critically low.  Per NSG report this AM, pt was coughing when trying to swallow Pills.  Subjective: Patient alert, sitting up in bed, dysarthric speech Assessment / Plan / Recommendation CHL IP CLINICAL IMPRESSIONS 04/02/2019 Clinical Impression Pt presents with mild pharyngeal dysphagia c/b decreased  anterior hyoid movement. As such, pt has moderate amounts of vallecular residue with dysphagia 3. Liquid wash was helpful in clearing residue as pt had better response to heavier bolus when dysphagia 3 and liquid were present with liquid wash. Whole pill was administered with just thin liquids and also whole in puree. With each, whole pill was difficult to clear from the vallecula and required multiple boluses of puree to clear. Medicine must be CRUSHED with puree. 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 Recommend pt continue dysphagia 3, thin liquids with medicine crushed in puree. SLP Visit Diagnosis Dysphagia, pharyngeal phase (R13.13) Attention and concentration deficit following -- Frontal lobe and executive function deficit following -- Impact on safety and function Mild aspiration risk   CHL IP TREATMENT RECOMMENDATION 04/02/2019 Treatment Recommendations Therapy as outlined in treatment plan below   Prognosis 03/17/2019 Prognosis for Safe Diet Advancement Fair Barriers to Reach Goals Cognitive deficits;Language deficits;Severity of deficits;Time post onset Barriers/Prognosis Comment -- CHL IP DIET RECOMMENDATION 04/02/2019 SLP Diet Recommendations Dysphagia 3 (Mech soft) solids;Thin liquid Liquid Administration via Cup;Straw Medication Administration Crushed with puree Compensations Minimize environmental distractions;Slow rate;Small sips/bites Postural Changes Seated upright at 90 degrees   CHL IP OTHER RECOMMENDATIONS 04/02/2019 Recommended Consults -- Oral Care Recommendations Oral care BID Other Recommendations --   CHL IP FOLLOW UP RECOMMENDATIONS 04/02/2019 Follow up Recommendations Skilled Nursing facility   [facility] IP FREQUENCY AND DURATION 03/18/2019 Speech Therapy Frequency (ACUTE  ONLY) min 3x week Treatment Duration --      CHL IP ORAL PHASE 04/02/2019 Oral Phase WFL Oral - Pudding Teaspoon -- Oral - Pudding Cup -- Oral - Honey  Teaspoon -- Oral - Honey Cup -- Oral - Nectar Teaspoon -- Oral - Nectar Cup -- Oral - Nectar Straw -- Oral - Thin Teaspoon -- Oral - Thin Cup -- Oral - Thin Straw -- Oral - Puree -- Oral - Mech Soft -- Oral - Regular -- Oral - Multi-Consistency -- Oral - Pill -- Oral Phase - Comment --  CHL IP PHARYNGEAL PHASE 04/02/2019 Pharyngeal Phase -- Pharyngeal- Pudding Teaspoon -- Pharyngeal -- Pharyngeal- Pudding Cup -- Pharyngeal -- Pharyngeal- Honey Teaspoon -- Pharyngeal -- Pharyngeal- Honey Cup -- Pharyngeal -- Pharyngeal- Nectar Teaspoon -- Pharyngeal -- Pharyngeal- Nectar Cup -- Pharyngeal -- Pharyngeal- Nectar Straw -- Pharyngeal -- Pharyngeal- Thin Teaspoon -- Pharyngeal -- Pharyngeal- Thin Cup -- Pharyngeal -- Pharyngeal- Thin Straw -- Pharyngeal -- Pharyngeal- Puree Delayed swallow initiation-vallecula Pharyngeal -- Pharyngeal- Mechanical Soft Compensatory strategies attempted (with notebox);Pharyngeal residue - valleculae Pharyngeal -- Pharyngeal- Regular -- Pharyngeal -- Pharyngeal- Multi-consistency -- Pharyngeal -- Pharyngeal- Pill Pharyngeal residue - valleculae;Other (Comment);Compensatory strategies attempted (with notebox) Pharyngeal -- Pharyngeal Comment --  CHL IP CERVICAL ESOPHAGEAL PHASE 04/02/2019 Cervical Esophageal Phase WFL Pudding Teaspoon -- Pudding Cup -- Honey Teaspoon -- Honey Cup -- Nectar Teaspoon -- Nectar Cup -- Nectar Straw -- Thin Teaspoon -- Thin Cup -- Thin Straw -- Puree -- Mechanical Soft -- Regular -- Multi-consistency -- Pill -- Cervical Esophageal Comment -- Happi Overton 04/02/2019, 2:28 PM               Result Date: 03/30/2019 Fluoroscopy was utilized by the requesting physician.  No radiographic interpretation.    VAS [place] UPPER EXT VEIN MAPPING (PRE-OP AVF) Result Date: 03/29/2019 UPPER EXTREMITY VEIN MAPPING  Indications: Pre dialysis access. History: ESRD.  Comparison Study: No prior Performing Technologist: Sharion Dove RVS  Examination Guidelines: A complete evaluation  includes B-mode imaging, spectral Doppler, color Doppler, and power Doppler as needed of all accessible portions of each vessel. Bilateral testing is considered an integral part of a complete examination. Limited examinations for reoccurring indications may be performed as noted. +-----------------+-------------+----------+---------+ Right Cephalic   Diameter (cm)Depth (cm)Findings  +-----------------+-------------+----------+---------+ Shoulder             0.31        0.17             +-----------------+-------------+----------+---------+ Prox upper arm       0.26        0.24             +-----------------+-------------+----------+---------+ Mid upper arm        0.26        0.24             +-----------------+-------------+----------+---------+ Dist upper arm       0.24        0.48             +-----------------+-------------+----------+---------+ Antecubital fossa    0.38        0.41             +-----------------+-------------+----------+---------+ Prox forearm         0.46        0.45   branching +-----------------+-------------+----------+---------+ Mid forearm          0.50        0.33   branching +-----------------+-------------+----------+---------+ Wrist  0.23        0.32             +-----------------+-------------+----------+---------+ +-----------------+-------------+----------+----------------------+ Right Basilic    Diameter (cm)Depth (cm)       Findings        +-----------------+-------------+----------+----------------------+ Prox upper arm       0.70        1.15                          +-----------------+-------------+----------+----------------------+ Mid upper arm        0.74        0.90                          +-----------------+-------------+----------+----------------------+ Dist upper arm       0.57        0.77         branching        +-----------------+-------------+----------+----------------------+  Antecubital fossa    0.66        0.75          thrombus        +-----------------+-------------+----------+----------------------+ Prox forearm         0.64        0.58   branching and thrombus +-----------------+-------------+----------+----------------------+ Mid forearm          0.45        0.40         branching        +-----------------+-------------+----------+----------------------+ Wrist                0.22        0.23                          +-----------------+-------------+----------+----------------------+ Thrombus noted in right basilic at [facility] and proximal forearm. +-----------------+-------------+----------+---------+ Left Cephalic    Diameter (cm)Depth (cm)Findings  +-----------------+-------------+----------+---------+ Shoulder             0.41        0.94             +-----------------+-------------+----------+---------+ Prox upper arm       0.43        0.27             +-----------------+-------------+----------+---------+ Mid upper arm        0.37        0.24             +-----------------+-------------+----------+---------+ Dist upper arm       0.50        0.30             +-----------------+-------------+----------+---------+ Antecubital fossa    0.69        0.23             +-----------------+-------------+----------+---------+ Prox forearm         0.57        0.61   branching +-----------------+-------------+----------+---------+ Mid forearm          0.36        0.25             +-----------------+-------------+----------+---------+ Wrist                0.42        0.36             +-----------------+-------------+----------+---------+ *See table(s) above for measurements and observations.  Diagnosing physician: Servando Snare MD  Electronically signed by Servando Snare MD on 03/29/2019 at 10:54:34 AM.    Final     Labs:  Basic Metabolic Panel: Recent Labs  Lab 03/31/19 0541 04/01/19 0708 04/02/19 3710 04/03/19 0755  04/04/19 0240 04/04/19 0241 04/05/19 0905  NA 133* 136 137 139 139 139 138  K 4.4 3.9 4.3 4.1 4.7 4.8 4.1  CL 96* 97* 103 101 104 103 99  CO2 22 25 22 28 24 24   --   GLUCOSE 307* 140* 155* 159* 133* 135* 88  BUN 56* 34* 42* 30* 66* 68* 31*  CREATININE 9.17* 6.46* 8.04* 5.32* 6.50* 6.76* 4.20*  CALCIUM 7.8* 7.4* 7.7* 7.8* 7.4* 7.3*  --   PHOS 4.4 3.4 3.7 2.3* 2.3*  --   --     CBC: Recent Labs  Lab 04/04/19 0241 04/04/19 0241 04/04/19 1543 04/05/19 0237 04/05/19 0905 04/05/19 2035 04/06/19 0833  WBC 11.3*  --  12.8*  --   --   --  6.9  NEUTROABS 8.1*  --  10.4*  --   --   --  5.2  HGB 5.1*   < > 6.1*   < > 6.5* 9.4* 9.0*  HCT 16.2*   < > 18.2*   < > 19.0* 27.2* 27.2*  MCV 105.9*  --  94.3  --   --   --  93.2  PLT 226  --  183  --   --   --  124*   < > = values in this interval not displayed.    CBG: Recent Labs  Lab 04/06/19 1149 04/06/19 1528 04/06/19 2058 04/07/19 0716 04/07/19 1108  GLUCAP 83 99 138* 85 150*    Brief HPI:   Kyle [name] is a 69 y.o. male with history of T2DM with nephropathy and neuropathy, ESRD-on PD, seizure disorder, COPD, morbid obesity, gait disorder with falls, chronic diarrhea who was admitted to [facility] on [facility]/12/2019 with significant hypocalcemia, confusion and weakness.  Wife reported patient having missed dialysis for 3 days and patient was noted to have elevated phosphorus with an anion gap.  He was started on IV calcium and phosphate binders.  He was reported to have a fall in ED while trying to ambulate to bathroom independently.  CT head was negative for acute changes.  He did develop left proptosis with decrease in ability to use RUE and was also found to have balance deficit with significant left lean on PT evaluation.  MRI brain done showing acute left paramedian pontine infarct.  Neurology consulted for input and felt the stroke was due to small vessel disease and recommended DAPT x3 weeks followed by aspirin alone.  He was reported  to have bouts of confusion with hallucination as well as electrolyte abnormalities.  Speech therapy recommended downgrading diet to dysphagia to nectars due to mild to moderate oropharyngeal dysphagia.  He continued to be limited by balance deficits with ataxia as well as decreased awareness of deficits with impulsivity.  CIR was recommended due to functional decline   Hospital Course: Kyle [name] was admitted to rehab 03/20/2019 for inpatient therapies to consist of PT, ST and OT at least three hours five days a week. Past admission physiatrist, therapy team and rehab RN have worked together to provide customized collaborative inpatient rehab. At admission patient required mod to max assist with basic self-care task and max assist with mobility.  He exhibited moderate cognitive deficits affecting basic problem-solving, short-term recall with fluctuating attention/alertness as well as decreased  awareness of deficits.  Nephrology was consulted for input and initially PD was on going in the evenings with routine labs.  IV Ferrlecit was added on 1/26- 1/31 due to low iron stores and he was started on darbepoetin 25 mcg weekly for anemia of chronic disease.  Electrolyte abnormalities have been managed by nephrology with supplementation. He diet was advanced to dysphagia 3, thins with supervision for safe swallow strategies.   His blood pressures were monitored on TID basis and losartan was discontinued on 1/25.   Diabetes was monitored with achs CBG checks.  Lantus was resumed and was titrated upwards for better BS control.  Fluctuating bouts of lethargy have resolved with improvement in activity tolerance.  Rehab goals were set for min assist level and wife felt that she could not provide care. He was transitioned  from PD to hemodialysis due to change in disposition to SNF.   Pre op labs on 2/1 showed drop in hemoglobin to 5.1 and he received 1 units packed red blood cells.  Right IJ tunneled dialysis  catheter in left brachiocephalic AV fistula was placed on 2/01 by Dr. Monica Martinez.  CBC on 2/02 showed Hgb- 6.7 and he was transfused with additional 1 unit PRBC with follow up H/H up to 7.2.  Daily stool guaiacs were ordered due to one heme positive stool and SQ heparin was discontinued.    On early a.m. of 02/06, he developed coffee-ground emesis with melena and hematochezia. Follow up labs showed drop in hemoglobin to 5.1 and he was transfused with 2 units PRBC and started on IV PPI bid. Follow up CBC showed minimal rise in H/H and Dr. Michail Sermon was consulted for input. He expressed concerns that bleeding was due to peptic ulcer and recommended clear diet with plans for EGD in am. He had another episode of hematochezia in HD and patient was discharged to acute floor due to need for closer monitoring and further workup.    Medications at discharge:   1. Norvasc 5 mg po daily 2. Lipitor 80 mg po daily 3. EC ASA 81 mg po daily. 4. Neurontin 100 mg po bid. 5. Lantus 30 units SQ daily. 6. Levothyroxine 200 mcg po daily 7. Keppra 500 mg po bid. 8. Provigil 100 mg po dialy 9. Pancrease 24,000 units po tid ac 10 Toprol XL 50 mg po daily. 11. Rena vite one po daily.  12. protonix IV 40 mg bid 13. Nephro supplements bid.    Discharge Instructions    Diet - low sodium heart healthy   Complete by: As directed    NPO at midnight tonight for EGD in AM- renal clear liquid diet for dinner      Disposition: Acute hospital.    Signed: Bary Leriche 04/07/2019, 5:30 PM

## 2019-04-07 ENCOUNTER — Inpatient Hospital Stay (HOSPITAL_COMMUNITY): Payer: Medicare PPO

## 2019-04-07 LAB — CBC
HCT: 26.3 % — ABNORMAL LOW (ref 39.0–52.0)
Hemoglobin: 8.7 g/dL — ABNORMAL LOW (ref 13.0–17.0)
MCH: 30.9 pg (ref 26.0–34.0)
MCHC: 33.1 g/dL (ref 30.0–36.0)
MCV: 93.3 fL (ref 80.0–100.0)
Platelets: 136 10*3/uL — ABNORMAL LOW (ref 150–400)
RBC: 2.82 MIL/uL — ABNORMAL LOW (ref 4.22–5.81)
RDW: 15.9 % — ABNORMAL HIGH (ref 11.5–15.5)
WBC: 7.3 10*3/uL (ref 4.0–10.5)
nRBC: 0 % (ref 0.0–0.2)

## 2019-04-07 LAB — RENAL FUNCTION PANEL
Albumin: 1.9 g/dL — ABNORMAL LOW (ref 3.5–5.0)
Anion gap: 9 (ref 5–15)
BUN: 53 mg/dL — ABNORMAL HIGH (ref 8–23)
CO2: 20 mmol/L — ABNORMAL LOW (ref 22–32)
Calcium: 6.3 mg/dL — CL (ref 8.9–10.3)
Chloride: 107 mmol/L (ref 98–111)
Creatinine, Ser: 7.08 mg/dL — ABNORMAL HIGH (ref 0.61–1.24)
GFR calc Af Amer: 8 mL/min — ABNORMAL LOW (ref >60)
GFR calc non Af Amer: 7 mL/min — ABNORMAL LOW (ref >60)
Glucose, Bld: 176 mg/dL — ABNORMAL HIGH (ref 70–99)
Phosphorus: 3 mg/dL (ref 2.5–4.6)
Potassium: 4.2 mmol/L (ref 3.5–5.1)
Sodium: 136 mmol/L (ref 135–145)

## 2019-04-07 LAB — HEMOGLOBIN AND HEMATOCRIT, BLOOD
HCT: 27.2 % — ABNORMAL LOW (ref 39.0–52.0)
Hemoglobin: 9.4 g/dL — ABNORMAL LOW (ref 13.0–17.0)

## 2019-04-07 LAB — GLUCOSE, CAPILLARY
Glucose-Capillary: 117 mg/dL — ABNORMAL HIGH (ref 70–99)
Glucose-Capillary: 145 mg/dL — ABNORMAL HIGH (ref 70–99)
Glucose-Capillary: 150 mg/dL — ABNORMAL HIGH (ref 70–99)
Glucose-Capillary: 85 mg/dL (ref 70–99)
Glucose-Capillary: 91 mg/dL (ref 70–99)

## 2019-04-07 LAB — SURGICAL PATHOLOGY

## 2019-04-07 LAB — METHYLMALONIC ACID, SERUM: Methylmalonic Acid, Quantitative: 1251 nmol/L — ABNORMAL HIGH (ref 0–378)

## 2019-04-07 MED ORDER — PEG 3350-KCL-NA BICARB-NACL 420 G PO SOLR
4000.0000 mL | Freq: Once | ORAL | Status: AC
  Administered 2019-04-07: 4000 mL via ORAL
  Filled 2019-04-07: qty 4000

## 2019-04-07 MED ORDER — HEPARIN SODIUM (PORCINE) 1000 UNIT/ML IJ SOLN
INTRAMUSCULAR | Status: AC
  Filled 2019-04-07: qty 4

## 2019-04-07 NOTE — Progress Notes (Signed)
[facility] Progress Note    Assessment/ Plan:    ESRD: Transitioned from PD to HD on 2/2, last session 2/6.  RUE AVF placed on 2/1.  Awaiting outpatient HD placement, plan for HD today 2/9, will hold heparin.    Anemia of CKD   Suspected Blood loss anemia: Hgb 9.0>8.7, s/p 4 pRBCs.  Plan per GI, undergoing colonoscopy on 2/10.  Receiving Aranesp weekly.   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    Hypertension: Stable. On metoprolol, cont to hold norvasc given likely blood loss.    Nutrition: Albumin 1.9. Renavit MVI daily.    Acute [place] Stroke: Continued rehabilitation.   T2DM  Hypothyroidism: Per primary.    Subjective:   Continues to have melena. Doing alright this morning, says he feels "lost" and hoping his health improves in the next several weeks.    Objective:   BP 131/64 (BP Location: Right Arm)   Pulse 76   Temp (!) 97.5 F (36.4 C) (Oral)   Resp 12   SpO2 100%   Intake/Output Summary (Last 24 hours) at 04/07/2019 0813 Last data filed at 04/07/2019 0545 Gross per 24 hour  Intake 550 ml  Output 100 ml  Net 450 ml   Weight change:   Physical Exam: General: NAD, resting comfortably, pleasant this morning  Cardiac: RRR no m/g/r Lungs: Clear bilaterally, no increased WOB  Abdomen: soft, non-tender Ext: Warm, dry, no edema  Neuro: Alert, follows commands. Speech normal. Able to move all extremities.  Access: Right IJ tunneled catheter with LUE AVF with palpable thrill/audible bruit   Imaging: No results found.  Labs: BMET Recent Labs  Lab 04/01/19 0708 04/02/19 0525 04/03/19 0755 04/04/19 0240 04/04/19 0241 04/05/19 0905 04/06/19 0833  NA 136 137 139 139 139 138 138  K 3.9 4.3 4.1 4.7 4.8 4.1 4.3  CL 97* 103 101 104 103 99 106  CO2 25 22 28 24 24   --  22  GLUCOSE 140* 155* 159* 133* 135* 88 104*  BUN 34* 42* 30* 66* 68* 31* 46*  CREATININE 6.46* 8.04* 5.32* 6.50* 6.76* 4.20* 5.78*  CALCIUM 7.4* 7.7*  7.8* 7.4* 7.3*  --  6.6*  PHOS 3.4 3.7 2.3* 2.3*  --   --   --    CBC Recent Labs  Lab 04/04/19 0241 04/04/19 0241 04/04/19 1543 04/05/19 0237 04/05/19 0905 04/05/19 2035 04/06/19 0833 04/07/19 0645  WBC 11.3*  --  12.8*  --   --   --  6.9 7.3  NEUTROABS 8.1*  --  10.4*  --   --   --  5.2  --   HGB 5.1*   < > 6.1*   < > 6.5* 9.4* 9.0* 8.7*  HCT 16.2*   < > 18.2*   < > 19.0* 27.2* 27.2* 26.3*  MCV 105.9*  --  94.3  --   --   --  93.2 93.3  PLT 226  --  183  --   --   --  124* 136*   < > = values in this interval not displayed.    Medications:    . sodium chloride   Intravenous Once  . atorvastatin  80 mg Oral Daily  . chlorhexidine  15 mL Mouth Rinse BID  . Chlorhexidine Gluconate Cloth  6 each Topical Daily  . [START ON 04/11/2019] darbepoetin (ARANESP) injection - DIALYSIS  100 mcg Intravenous Q Sat-HD  . insulin aspart  0-15 Units  Subcutaneous TID WC  . levothyroxine  200 mcg Oral q morning - 10a  . multivitamin  1 tablet Oral QHS  . naloxone  0.4 mg Intramuscular Once  . [START ON 04/08/2019] pantoprazole  40 mg Intravenous Q12H  . traZODone  100 mg Oral QHS      Darrelyn Hillock, DO Family Medicine PGY-2  04/07/2019, 8:13 AM

## 2019-04-07 NOTE — Progress Notes (Signed)
Patient refusing BiPAP. Informed patient if he changes his mind to let RN know. Vitals stable.

## 2019-04-07 NOTE — Progress Notes (Signed)
PT Cancellation Note  Patient Details Name: Kyle [name] MRN: [id number] DOB: 09/03/1950   Cancelled Treatment:    Reason Eval/Treat Not Completed: Patient at procedure or test/unavailable  Pt at dialysis.  Will follow up as able.  Maggie Font, PT Acute Rehab Services Pager [phone] [facility] [phone] [facility] [phone]    Karlton Lemon 04/07/2019, 4:41 PM

## 2019-04-07 NOTE — Progress Notes (Addendum)
PROGRESS NOTE    Kyle [name]  ZOX:[id number] DOB: Jun 29, 1950 DOA: 04/04/2019 PCP: Idelle Crouch, MD   Brief Narrative:  Patient was discharged from [facility] on 1/21 to CIR at [facility] after being treated for acute left midbrain stroke. He subsequently developed GI bleed(acute onset of black and red stools X 5 overnight and also vomited coffee grounds one timewhile at rehab)and acute blood loss anemia and is admitted to [facility] on 2/6   Nephrology and GI following: plan for colonoscopy 04/08/19  Assessment & Plan:   Principal Problem:   GI bleed Active Problems:   Chronic obstructive pulmonary disease (HCC)   Diabetes mellitus (HCC)   Insulin dependent type 2 diabetes mellitus ([place])   ESRD (end stage renal disease) ([place])   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[place])   Acute upper GI bleed  GI bleed/blood loss anemia: S/p EGDno bleeding source find in EGD with duodenitis and gastritis, patient has a colonoscopy in 11/2018 "adenomatous polyps and a tubulovillous adenoma were removed and extensive diverticulosis was noted" He denies ab pain Got2prbc2/7/21, appropriate increase to Hg 8.7 today (up from 6.5 prior to [place]) Per nursing more bloody/melanotic BM yesterday/today GI recommend continue PPI drip through 04/07/19 then transition to IV BID PPI - GI to do colonoscopy 04/08/19 -GI will follow  HTN: Currently bp low normal, Hold home bp meds includingnorvasc, cozaar Change toprol-xl to low dose lopressor with holding parameters  CVA withdifficulty with balance and ptosis of left eyelid. He was on asa/plavix for three wks then asa alone -mental status improved today, checking CT head after further discussion with wife behavior yesterday was not normal Hold asa in the setting of gi bleed, continueStatin  Seizure disorder on keppra chronically -IV currently as he had refused meds, will change back to PO tomorrow if tolerating oral  Insulin dependentDM2 He was  discharged on insulin (lantus and ssi) from recent hospitalization he is on ssi for now  ESRD was on peritoneal dialysis transitioned to HD on 2/2 Status post RIJ TDC and left BCAVF placed by Dr. Carlis Abbott 2/1. Case discussed with nephrology Dr Carolin Sicks who will see patient in consult, plan per nephrology, nephrology input appreciated -plan for HD 04/07/19  COPD, not home o2 dependent,No wheezing on exam, no hypoxia  Chronic RBBB EKG stable   Hypothyroidism,continue Synthroid  DVT prophylaxis: SCD Code Status: Full Family Communication: (Specify name, relationship & date discussed. NO "discussed with patient") Disposition Plan:  . Patient came from:CIR            . Anticipated d/c place:unclear pending evaluation once GI bleeding controlled . Barriers to d/c OR conditions which need to be met to effect a safe d/c: needs colonoscopy 04/08/19 to assess ongoing melanotic stools  Consultants:   GI  Nephrology  Procedures:   EGD 04/05/19  Antimicrobials:   none   Subjective: Much more communicative today, able to hold conversation. Admitting to some confusion about the timeline of stroke, rehab, then coming back to hospital. Denies abdominal or chest pains. Denies SOB. Is still having melanotic looking stools yesterday and this morning had 1 also.   Objective: Vitals:   04/07/19 0010 04/07/19 0406 04/07/19 0406 04/07/19 0718  BP: (!) 135/50  (!) 145/64 131/64  Pulse: 71 78 81 76  Resp: _0 Temp: 98.1 F (36.7 C)  98.3 F (36.8 C) (!) 97.5 F (36.4 C)  TempSrc: Oral  Oral Oral  SpO2: 98% 100%  100%    Intake/Output  Summary (Last 24 hours) at 04/07/2019 1015 Last data filed at 04/07/2019 0545 Gross per 24 hour  Intake 550 ml  Output 100 ml  Net 450 ml   There were no vitals filed for this visit.  Examination:  General exam: Appears calm and comfortable  Respiratory system: Clear to auscultation. Respiratory effort normal. Cardiovascular system: S1 & S2  heard, RRR. No JVD, murmurs, rubs, gallops or clicks. No pedal edema. Gastrointestinal system: Abdomen is nondistended, soft and nontender. No organomegaly or masses felt. Normal bowel sounds heard. Central nervous system: Alert and oriented. Left eyelid with drooping, no slurred speech Extremities: Symmetric 5 x 5 power. Skin: No rashes, lesions or ulcers Psychiatry: Some confusion about timeline but much more appropriate than yesterday  Data Reviewed: I have personally reviewed following labs and imaging studies  CBC: Recent Labs  Lab 04/02/19 0629 04/02/19 0629 04/04/19 0241 04/04/19 0241 04/04/19 1543 04/04/19 1543 04/05/19 0237 04/05/19 0905 04/05/19 2035 04/06/19 0833 04/07/19 0645  WBC 7.8  --  11.3*  --  12.8*  --   --   --   --  6.9 7.3  NEUTROABS  --   --  8.1*  --  10.4*  --   --   --   --  5.2  --   HGB 7.2*   < > 5.1*   < > 6.1*   < > 8.1* 6.5* 9.4* 9.0* 8.7*  HCT 21.9*   < > 16.2*   < > 18.2*   < > 23.5* 19.0* 27.2* 27.2* 26.3*  MCV 100.5*  --  105.9*  --  94.3  --   --   --   --  93.2 93.3  PLT 201  --  226  --  183  --   --   --   --  124* 136*   < > = values in this interval not displayed.   Basic Metabolic Panel: Recent Labs  Lab 04/01/19 0708 04/01/19 0708 04/02/19 7322 04/02/19 0525 04/03/19 0755 04/04/19 0240 04/04/19 0241 04/05/19 0905 04/06/19 0833  NA 136   < > 137   < > 139 139 139 138 138  K 3.9   < > 4.3   < > 4.1 4.7 4.8 4.1 4.3  CL 97*   < > 103   < > 101 104 103 99 106  CO2 25   < > 22  --  _0 --  22  GLUCOSE 140*   < > 155*   < > 159* 133* 135* 88 104*  BUN 34*   < > 42*   < > 30* 66* 68* 31* 46*  CREATININE 6.46*   < > 8.04*   < > 5.32* 6.50* 6.76* 4.20* 5.78*  CALCIUM 7.4*   < > 7.7*  --  7.8* 7.4* 7.3*  --  6.6*  PHOS 3.4  --  3.7  --  2.3* 2.3*  --   --   --    < > = values in this interval not displayed.   GFR: Estimated Creatinine Clearance: 14.4 mL/min (A) (by C-G formula based on SCr of 5.78 mg/dL (H)). Liver  Function Tests: Recent Labs  Lab 04/01/19 0708 04/02/19 0525 04/03/19 0755 04/04/19 0240 04/04/19 0241  AST  --   --   --   --  21  ALT  --   --   --   --  11  ALKPHOS  --   --   --   --  50  BILITOT  --   --   --   --  0.4  PROT  --   --   --   --  4.3*  ALBUMIN 1.8* 1.9* 2.0* 1.9* 1.9*   No results for input(s): LIPASE, AMYLASE in the last 168 hours. No results for input(s): AMMONIA in the last 168 hours. Coagulation Profile: No results for input(s): INR, PROTIME in the last 168 hours. Cardiac Enzymes: No results for input(s): CKTOTAL, CKMB, CKMBINDEX, TROPONINI in the last 168 hours. BNP (last 3 results) No results for input(s): PROBNP in the last 8760 hours. HbA1C: No results for input(s): HGBA1C in the last 72 hours. CBG: Recent Labs  Lab 04/06/19 0733 04/06/19 1149 04/06/19 1528 04/06/19 2058 04/07/19 0716  GLUCAP 71 83 99 138* 85   Lipid Profile: No results for input(s): CHOL, HDL, LDLCALC, TRIG, CHOLHDL, LDLDIRECT in the last 72 hours. Thyroid Function Tests: No results for input(s): TSH, T4TOTAL, FREET4, T3FREE, THYROIDAB in the last 72 hours. Anemia Panel: No results for input(s): VITAMINB12, FOLATE, FERRITIN, TIBC, IRON, RETICCTPCT in the last 72 hours. Sepsis Labs: No results for input(s): PROCALCITON, LATICACIDVEN in the last 168 hours.  Recent Results (from the past 240 hour(s))  Surgical pcr screen     Status: None   Collection Time: 04/04/19  8:22 PM   Specimen: Nasal Mucosa; Nasal Swab  Result Value Ref Range Status   MRSA, PCR NEGATIVE NEGATIVE Final   Staphylococcus aureus NEGATIVE NEGATIVE Final    Comment: (NOTE) The Xpert SA Assay (FDA approved for NASAL specimens in patients 81 years of age and older), is one component of a comprehensive surveillance program. It is not intended to diagnose infection nor to guide or monitor treatment. Performed at [facility], [place] [address] [place], [place] [address]     Radiology Studies: No  results found.  Scheduled Meds: . sodium chloride   Intravenous Once  . atorvastatin  80 mg Oral Daily  . chlorhexidine  15 mL Mouth Rinse BID  . Chlorhexidine Gluconate Cloth  6 each Topical Daily  . [START ON 04/11/2019] darbepoetin (ARANESP) injection - DIALYSIS  100 mcg Intravenous Q Sat-HD  . insulin aspart  0-15 Units Subcutaneous TID WC  . levothyroxine  200 mcg Oral q morning - 10a  . multivitamin  1 tablet Oral QHS  . naloxone  0.4 mg Intramuscular Once  . [START ON 04/08/2019] pantoprazole  40 mg Intravenous Q12H  . traZODone  100 mg Oral QHS   Continuous Infusions: . levETIRAcetam 500 mg (04/07/19 0041)  . pantoprozole (PROTONIX) infusion 8 mg/hr (04/07/19 0039)     LOS: 3 days   Time spent: [place], MD [facility]  To contact the attending provider between 7A-7P or the covering provider during after hours 7P-7A, please log into the web site [email or web address] and access using universal [place] password for that web site. If you do not have the password, please call the hospital operator.  04/07/2019, 10:15 AM

## 2019-04-07 NOTE — Progress Notes (Signed)
Subjective: No complaints.  Objective: Vital signs in last 24 hours: Temp:  [96.8 F (36 C)-98.4 F (36.9 C)] 98.3 F (36.8 C) (02/09 0406) Pulse Rate:  [71-84] 81 (02/09 0406) Resp:  [12-22] 12 (02/09 0406) BP: (134-149)/(50-71) 145/64 (02/09 0406) SpO2:  [98 %-100 %] 100 % (02/09 0406) Last BM Date: 04/06/19  Intake/Output from previous day: 02/08 0701 - 02/09 0700 In: 550 [P.O.:150; I.V.:200; IV Piggyback:200] Out: 100 [Urine:100] Intake/Output this shift: No intake/output data recorded.  General appearance: alert and no distress GI: soft, non-tender; bowel sounds normal; no masses,  no organomegaly  Lab Results: Recent Labs    04/04/19 1543 04/05/19 0237 04/05/19 2035 04/06/19 0833 04/07/19 0645  WBC 12.8*  --   --  6.9 7.3  HGB 6.1*   < > 9.4* 9.0* 8.7*  HCT 18.2*   < > 27.2* 27.2* 26.3*  PLT 183  --   --  124* 136*   < > = values in this interval not displayed.   BMET Recent Labs    04/05/19 0905 04/06/19 0833  NA 138 138  K 4.1 4.3  CL 99 106  CO2  --  22  GLUCOSE 88 104*  BUN 31* 46*  CREATININE 4.20* 5.78*  CALCIUM  --  6.6*   LFT No results for input(s): PROT, ALBUMIN, AST, ALT, ALKPHOS, BILITOT, BILIDIR, IBILI in the last 72 hours. PT/INR No results for input(s): LABPROT, INR in the last 72 hours. Hepatitis Panel No results for input(s): HEPBSAG, HCVAB, HEPAIGM, HEPBIGM in the last 72 hours. C-Diff No results for input(s): CDIFFTOX in the last 72 hours. Fecal Lactopherrin No results for input(s): FECLLACTOFRN in the last 72 hours.  Studies/Results: No results found.  Medications:  Scheduled: . sodium chloride   Intravenous Once  . atorvastatin  80 mg Oral Daily  . chlorhexidine  15 mL Mouth Rinse BID  . Chlorhexidine Gluconate Cloth  6 each Topical Daily  . [START ON 04/11/2019] darbepoetin (ARANESP) injection - DIALYSIS  100 mcg Intravenous Q Sat-HD  . insulin aspart  0-15 Units Subcutaneous TID WC  . levothyroxine  200 mcg Oral q  morning - 10a  . multivitamin  1 tablet Oral QHS  . naloxone  0.4 mg Intramuscular Once  . [START ON 04/08/2019] pantoprazole  40 mg Intravenous Q12H  . traZODone  100 mg Oral QHS   Continuous: . levETIRAcetam 500 mg (04/07/19 0041)  . pantoprozole (PROTONIX) infusion 8 mg/hr (04/07/19 0039)    Assessment/Plan: 1) Melena. 2) Anemia.   Nursing reports that the most recent stooling appeared to be melenic and similar to his prior stooling.  The patient feels well at this time.  There is a mild decline in his HGB at 8.7 g/dL.  Plan: 1) Colonoscopy with Dr. Collene Mares tomorrow. 2) Follow HGB and transfuse as necessary.  LOS: 3 days   Kyle [name] D 04/07/2019, 7:13 AM

## 2019-04-07 NOTE — Progress Notes (Signed)
Patient is NPO at midnight and is supposed to get a colonoscopy but no orders are in to drink anything before procedure.

## 2019-04-07 NOTE — Procedures (Signed)
Patient seen on Hemodialysis. BP (!) 160/76   Pulse 82   Temp 98.6 F (37 C) (Oral)   Resp 20   Wt 99 kg   SpO2 100%   BMI 29.60 kg/m   QB 400, UF goal 2L Tolerating treatment without complaints at this time.   Elmarie Shiley MD [facility]. Office # [phone] Pager # [phone] 4:22 PM

## 2019-04-08 ENCOUNTER — Encounter (HOSPITAL_COMMUNITY): Payer: Self-pay | Admitting: Certified Registered Nurse Anesthetist

## 2019-04-08 ENCOUNTER — Encounter (HOSPITAL_COMMUNITY)
Admission: AD | Disposition: A | Discharge status: Skilled Nursing Facility | Payer: Self-pay | Source: Ambulatory Visit | Attending: Internal Medicine

## 2019-04-08 ENCOUNTER — Inpatient Hospital Stay (HOSPITAL_COMMUNITY): Payer: Medicare PPO

## 2019-04-08 DIAGNOSIS — R569 Unspecified convulsions: Secondary | ICD-10-CM

## 2019-04-08 DIAGNOSIS — K922 Gastrointestinal hemorrhage, unspecified: Secondary | ICD-10-CM

## 2019-04-08 DIAGNOSIS — R4182 Altered mental status, unspecified: Secondary | ICD-10-CM

## 2019-04-08 DIAGNOSIS — J42 Unspecified chronic bronchitis: Secondary | ICD-10-CM

## 2019-04-08 DIAGNOSIS — Z008 Encounter for other general examination: Secondary | ICD-10-CM

## 2019-04-08 DIAGNOSIS — N186 End stage renal disease: Secondary | ICD-10-CM

## 2019-04-08 DIAGNOSIS — D696 Thrombocytopenia, unspecified: Secondary | ICD-10-CM

## 2019-04-08 DIAGNOSIS — G9341 Metabolic encephalopathy: Secondary | ICD-10-CM

## 2019-04-08 LAB — COMPREHENSIVE METABOLIC PANEL
ALT: 13 U/L (ref 0–44)
AST: 22 U/L (ref 15–41)
Albumin: 2 g/dL — ABNORMAL LOW (ref 3.5–5.0)
Alkaline Phosphatase: 66 U/L (ref 38–126)
Anion gap: 15 (ref 5–15)
BUN: 21 mg/dL (ref 8–23)
CO2: 24 mmol/L (ref 22–32)
Calcium: 7.3 mg/dL — ABNORMAL LOW (ref 8.9–10.3)
Chloride: 101 mmol/L (ref 98–111)
Creatinine, Ser: 4.8 mg/dL — ABNORMAL HIGH (ref 0.61–1.24)
GFR calc Af Amer: 13 mL/min — ABNORMAL LOW (ref >60)
GFR calc non Af Amer: 11 mL/min — ABNORMAL LOW (ref >60)
Glucose, Bld: 124 mg/dL — ABNORMAL HIGH (ref 70–99)
Potassium: 4 mmol/L (ref 3.5–5.1)
Sodium: 140 mmol/L (ref 135–145)
Total Bilirubin: 0.9 mg/dL (ref 0.3–1.2)
Total Protein: 4.5 g/dL — ABNORMAL LOW (ref 6.5–8.1)

## 2019-04-08 LAB — MAGNESIUM: Magnesium: 1.3 mg/dL — ABNORMAL LOW (ref 1.7–2.4)

## 2019-04-08 LAB — CBC WITH DIFFERENTIAL/PLATELET
Abs Immature Granulocytes: 0.05 10*3/uL (ref 0.00–0.07)
Basophils Absolute: 0.1 10*3/uL (ref 0.0–0.1)
Basophils Relative: 1 %
Eosinophils Absolute: 0.1 10*3/uL (ref 0.0–0.5)
Eosinophils Relative: 2 %
HCT: 26 % — ABNORMAL LOW (ref 39.0–52.0)
Hemoglobin: 8.8 g/dL — ABNORMAL LOW (ref 13.0–17.0)
Immature Granulocytes: 1 %
Lymphocytes Relative: 12 %
Lymphs Abs: 0.7 10*3/uL (ref 0.7–4.0)
MCH: 31.4 pg (ref 26.0–34.0)
MCHC: 33.8 g/dL (ref 30.0–36.0)
MCV: 92.9 fL (ref 80.0–100.0)
Monocytes Absolute: 0.5 10*3/uL (ref 0.1–1.0)
Monocytes Relative: 8 %
Neutro Abs: 4.7 10*3/uL (ref 1.7–7.7)
Neutrophils Relative %: 76 %
Platelets: 150 10*3/uL (ref 150–400)
RBC: 2.8 MIL/uL — ABNORMAL LOW (ref 4.22–5.81)
RDW: 15.1 % (ref 11.5–15.5)
WBC: 6.1 10*3/uL (ref 4.0–10.5)
nRBC: 0 % (ref 0.0–0.2)

## 2019-04-08 LAB — GLUCOSE, CAPILLARY
Glucose-Capillary: 93 mg/dL (ref 70–99)
Glucose-Capillary: 89 mg/dL (ref 70–99)
Glucose-Capillary: 114 mg/dL — ABNORMAL HIGH (ref 70–99)
Glucose-Capillary: 112 mg/dL — ABNORMAL HIGH (ref 70–99)

## 2019-04-08 LAB — BODY FLUID CELL COUNT WITH DIFFERENTIAL
Eos, Fluid: 2 %
Lymphs, Fluid: 49 %
Monocyte-Macrophage-Serous Fluid: 37 % — ABNORMAL LOW (ref 50–90)
Neutrophil Count, Fluid: 12 % (ref 0–25)
Total Nucleated Cell Count, Fluid: 14 cu mm (ref 0–1000)

## 2019-04-08 LAB — PHOSPHORUS: Phosphorus: 2.8 mg/dL (ref 2.5–4.6)

## 2019-04-08 LAB — TSH: TSH: 8.316 u[IU]/mL — ABNORMAL HIGH (ref 0.350–4.500)

## 2019-04-08 SURGERY — COLONOSCOPY WITH PROPOFOL
Anesthesia: Monitor Anesthesia Care

## 2019-04-08 MED ORDER — TRAZODONE HCL 50 MG PO TABS
50.0000 mg | ORAL_TABLET | Freq: Every day | ORAL | Status: DC
  Administered 2019-04-08 – 2019-04-15 (×8): 50 mg via ORAL
  Filled 2019-04-08 (×8): qty 1

## 2019-04-08 MED ORDER — LEVETIRACETAM IN NACL 500 MG/100ML IV SOLN: 500.0000 mg | INTRAVENOUS | Status: DC

## 2019-04-08 MED ORDER — GENTAMICIN SULFATE 0.1 % EX CREA
1.0000 "application " | TOPICAL_CREAM | Freq: Every day | CUTANEOUS | Status: DC
  Administered 2019-04-13 – 2019-04-15 (×2): 1 via TOPICAL
  Filled 2019-04-08: qty 15

## 2019-04-08 NOTE — Progress Notes (Signed)
Renal Navigator continues to follow along in order to refer to appropriate OP HD clinic location once SNF bed has been secured.  Alphonzo Cruise, [place] Renal Navigator [phone]

## 2019-04-08 NOTE — Consult Note (Signed)
Telepsych Consultation   Reason for Consult:  Capacity Referring Physician:  Dr. Alfredia Ferguson Location of Patient: [facility] 4N Location of Provider: [facility]  Patient Identification: Kyle [name] MRN:  [id number] Principal Diagnosis: GI bleed Diagnosis:  Principal Problem:   GI bleed Active Problems:   Chronic obstructive pulmonary disease ([place])   Diabetes mellitus ([place])   Insulin dependent type 2 diabetes mellitus ([place])   ESRD (end stage renal disease) ([place])   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[place])   Acute upper GI bleed   Total Time spent with patient: 30 minutes  Subjective:   Kyle [name] is a 69 y.o. male patient with history of seizure, ESRD, COPD, hypertension, recent stroke.  Patient presenting to hospital with complaints of GI bleed (black and red stools, and vomited coffee grounds) with acute blood loss and anemia.  Patient has had EGD assessing for upper GI bleed; afterward an episode of apnea.  Patient also reporting that he snores.  Patient has been refusing BiPAP, physical therapy, and Labs.     HPI:  Kyle [name], 69 y.o., male patient seen via tele psych by this provider, consulted with Dr. Dwyane Dee; and chart reviewed on 04/08/19.  On evaluation Kyle [name] asked if he understood his illness, and what could happen if he is not treated.  Patient responds that having a stroke in January 2021 is his primary illness, he feels that the bleeding is only secondary.  He is aware that if the bleeding is not treated that it could lead to   Patient states that he has now agreed to the bowel prep needed for the colonoscopy.  Refused labs because he did not want them done at that time.  States that he refused the BiPAP "because I don't wear one at home.  If I need it I will wear it but if I don't need it I don't want to.  They just come in her and plop down machine and ask if I wanted to try it while they were here and I said no.  Yes, I snore, been told I do anyway; but  I didn't need that."  Patient states he refused PT because he didn't feel like doing at that time.  "Now I want to get out of bed and move around and they won't let me."    Discussed reasons that labs are important for the doctors to assess body function; since he is anemic doctors can check blood levels, electrolytes, kidney function, thyroid and much more.  Informed that labs help the doctors next step of action to either diagnose with what is going or if any other labs are needed.  Patient informed that and endoscopy was done to check if there was upper GI bleed and now the colonoscopy was needed to check the lower GI; procedure needed for doctor to determine where the blood is coming from and what needs to be done to stop it.  Patient voiced understanding.  Patient aware that lab work is needed and states that he will let them take his blood.  Patient also agrees to drink prep for colonoscopy. Also discussed what could happen with refusal of treatments, and doctors not finding out where the blood is coming from:  Patient aware that blood loss can cause anemia; informed that blood loss can anemia which he is currently diagnosed with; also affect blood pressure, oxygen levels, reduce blood flow to brain, cause confusion, sleepiness.  Chest pain even heart attack.  Patient voiced  understanding.  Patient also informed that stroke is being treated with medications and rehab; bleeding is new onset and work up is different.   Patient appears to be set in his ways and wants to do things in his own time frame.  Patient also feels that things are not explained to him before he is asked to do something.  Patient states that he does have problems with his memory but it is more "I don't remember things in past as much."   Patient refusals are more related to things not being done when he wants it or feeling that staff can come back at a time that is convent to him.  Patient understands that finding the reason for blood  loss is important but not as important as his recent stroke.  Including patient family (wife/children) when explaining things to patient may also help.     Past Psychiatric History: Anxiety, Depression  Past Medical History:  Past Medical History:  Diagnosis Date  . Anxiety   . BPH (benign prostatic hyperplasia)   . Chronic kidney disease   . Colon polyps   . COPD (chronic obstructive pulmonary disease) ([place])   . Depression   . Diabetes mellitus without complication ([place])   . Diverticulosis of colon   . Hyperlipidemia   . Hypertension   . Hypothyroidism   . Nephrolithiasis   . Seizures ([place])     Past Surgical History:  Procedure Laterality Date  . AV FISTULA PLACEMENT Left 03/30/2019   Procedure: right brachiocephalic fistula creation;  Surgeon: Marty Heck, MD;  Location: [place];  Service: Vascular;  Laterality: Left;  . COLONOSCOPY    . COLONOSCOPY WITH PROPOFOL N/A 05/30/2015   Procedure: COLONOSCOPY WITH PROPOFOL;  Surgeon: Manya Silvas, MD;  Location: [facility] ENDOSCOPY;  Service: Endoscopy;  Laterality: N/A;  . COLONOSCOPY WITH PROPOFOL N/A 12/01/2018   Procedure: COLONOSCOPY WITH PROPOFOL;  Surgeon: Toledo, [name], MD;  Location: [facility] ENDOSCOPY;  Service: Gastroenterology;  Laterality: N/A;  . INSERTION OF DIALYSIS CATHETER Right 03/30/2019   Procedure: Ultrasound guided right internal jugular tunneled dialysis catheter placement;  Surgeon: Marty Heck, MD;  Location: [facility] OR;  Service: Vascular;  Laterality: Right;  . kidney stone    . THYROIDECTOMY    . VARICOCELE EXCISION    . VIDEO BRONCHOSCOPY Bilateral 05/25/2016   Procedure: VIDEO BRONCHOSCOPY WITHOUT FLUORO;  Surgeon: Juanito Doom, MD;  Location: [facility] ENDOSCOPY;  Service: Cardiopulmonary;  Laterality: Bilateral;   Family History:  Family History  Problem Relation Age of Onset  . Diabetes Mother   . Diabetes Maternal Grandmother   . Diabetes Maternal Grandfather   . Lung cancer Father   .  Emphysema Paternal Grandfather    Family Psychiatric  History: Unaware Social History:  Social History   Substance and Sexual Activity  Alcohol Use Yes  . Alcohol/week: 8.0 standard drinks  . Types: 8 Cans of beer per week   Comment: beer or wine      Social History   Substance and Sexual Activity  Drug Use No    Social History   Socioeconomic History  . Marital status: Married    Spouse name: Not on file  . Number of children: Not on file  . Years of education: Not on file  . Highest education level: Not on file  Occupational History  . Not on file  Tobacco Use  . Smoking status: Light Tobacco Smoker    Packs/day: 1.00    Years:  40.00    Pack years: 40.00    Types: Cigarettes    Last attempt to quit: 05/25/2016    Years since quitting: 2.8  . Smokeless tobacco: Never Used  Substance and Sexual Activity  . Alcohol use: Yes    Alcohol/week: 8.0 standard drinks    Types: 8 Cans of beer per week    Comment: beer or wine   . Drug use: No  . Sexual activity: Not on file  Other Topics Concern  . Not on file  Social History Narrative  . Not on file   Social Determinants of Health   Financial Resource Strain:   . Difficulty of Paying Living Expenses: Not on file  Food Insecurity:   . Worried About Charity fundraiser in the Last Year: Not on file  . Ran Out of Food in the Last Year: Not on file  Transportation Needs:   . Lack of Transportation (Medical): Not on file  . Lack of Transportation (Non-Medical): Not on file  Physical Activity:   . Days of Exercise per Week: Not on file  . Minutes of Exercise per Session: Not on file  Stress:   . Feeling of Stress : Not on file  Social Connections:   . Frequency of Communication with Friends and Family: Not on file  . Frequency of Social Gatherings with Friends and Family: Not on file  . Attends Religious Services: Not on file  . Active Member of Clubs or Organizations: Not on file  . Attends Archivist  Meetings: Not on file  . Marital Status: Not on file   Additional Social History:    Allergies:  No Known Allergies  Labs:  Results for orders placed or performed during the hospital encounter of 04/04/19 (from the past 48 hour(s))  Glucose, capillary     Status: Abnormal   Collection Time: 04/06/19  8:58 PM  Result Value Ref Range   Glucose-Capillary 138 (H) 70 - 99 mg/dL  CBC     Status: Abnormal   Collection Time: 04/07/19  6:45 AM  Result Value Ref Range   WBC 7.3 4.0 - 10.5 K/uL   RBC 2.82 (L) 4.22 - 5.81 MIL/uL   Hemoglobin 8.7 (L) 13.0 - 17.0 g/dL   HCT 26.3 (L) 39.0 - 52.0 %   MCV 93.3 80.0 - 100.0 fL   MCH 30.9 26.0 - 34.0 pg   MCHC 33.1 30.0 - 36.0 g/dL   RDW 15.9 (H) 11.5 - 15.5 %   Platelets 136 (L) 150 - 400 K/uL   nRBC 0.0 0.0 - 0.2 %    Comment: Performed at [facility], [address], [place], [place] [address]  Glucose, capillary     Status: None   Collection Time: 04/07/19  7:16 AM  Result Value Ref Range   Glucose-Capillary 85 70 - 99 mg/dL  Glucose, capillary     Status: Abnormal   Collection Time: 04/07/19 11:08 AM  Result Value Ref Range   Glucose-Capillary 150 (H) 70 - 99 mg/dL  Renal function panel     Status: Abnormal   Collection Time: 04/07/19  4:47 PM  Result Value Ref Range   Sodium 136 135 - 145 mmol/L   Potassium 4.2 3.5 - 5.1 mmol/L   Chloride 107 98 - 111 mmol/L   CO2 20 (L) 22 - 32 mmol/L   Glucose, Bld 176 (H) 70 - 99 mg/dL   BUN 53 (H) 8 - 23 mg/dL  Creatinine, Ser 7.08 (H) 0.61 - 1.24 mg/dL   Calcium 6.3 (LL) 8.9 - 10.3 mg/dL    Comment: CRITICAL RESULT CALLED TO, READ BACK BY AND VERIFIED WITH: A.MITCHELL,RN [id number] I.MANNING    Phosphorus 3.0 2.5 - 4.6 mg/dL   Albumin 1.9 (L) 3.5 - 5.0 g/dL   GFR calc non Af Amer 7 (L) >60 mL/min   GFR calc Af Amer 8 (L) >60 mL/min   Anion gap 9 5 - 15    Comment: Performed at [facility] [address] [place], [place] [address]  Glucose, capillary     Status: None    Collection Time: 04/07/19  5:57 PM  Result Value Ref Range   Glucose-Capillary 91 70 - 99 mg/dL  Glucose, capillary     Status: Abnormal   Collection Time: 04/07/19  8:05 PM  Result Value Ref Range   Glucose-Capillary 145 (H) 70 - 99 mg/dL  Glucose, capillary     Status: Abnormal   Collection Time: 04/07/19 11:39 PM  Result Value Ref Range   Glucose-Capillary 117 (H) 70 - 99 mg/dL  Glucose, capillary     Status: None   Collection Time: 04/08/19  7:29 AM  Result Value Ref Range   Glucose-Capillary 93 70 - 99 mg/dL  Glucose, capillary     Status: None   Collection Time: 04/08/19 11:48 AM  Result Value Ref Range   Glucose-Capillary 89 70 - 99 mg/dL  Body fluid cell count with differential     Status: Abnormal   Collection Time: 04/08/19  3:30 PM  Result Value Ref Range   Fluid Type-FCT Peritoneal    Color, Fluid COLORLESS    Appearance, Fluid CLEAR CLEAR   Total Nucleated Cell Count, Fluid 14 0 - 1,000 cu mm   Neutrophil Count, Fluid 12 0 - 25 %   Lymphs, Fluid 49 %   Monocyte-Macrophage-Serous Fluid 37 (L) 50 - 90 %   Eos, Fluid 2 %    Comment: Performed at [facility], [place] [address] [place], [place] [address]  Glucose, capillary     Status: Abnormal   Collection Time: 04/08/19  4:07 PM  Result Value Ref Range   Glucose-Capillary 114 (H) 70 - 99 mg/dL  CBC with Differential/Platelet     Status: Abnormal   Collection Time: 04/08/19  4:12 PM  Result Value Ref Range   WBC 6.1 4.0 - 10.5 K/uL   RBC 2.80 (L) 4.22 - 5.81 MIL/uL   Hemoglobin 8.8 (L) 13.0 - 17.0 g/dL   HCT 26.0 (L) 39.0 - 52.0 %   MCV 92.9 80.0 - 100.0 fL   MCH 31.4 26.0 - 34.0 pg   MCHC 33.8 30.0 - 36.0 g/dL   RDW 15.1 11.5 - 15.5 %   Platelets 150 150 - 400 K/uL   nRBC 0.0 0.0 - 0.2 %   Neutrophils Relative % 76 %   Neutro Abs 4.7 1.7 - 7.7 K/uL   Lymphocytes Relative 12 %   Lymphs Abs 0.7 0.7 - 4.0 K/uL   Monocytes Relative 8 %   Monocytes Absolute 0.5 0.1 - 1.0 K/uL   Eosinophils Relative 2  %   Eosinophils Absolute 0.1 0.0 - 0.5 K/uL   Basophils Relative 1 %   Basophils Absolute 0.1 0.0 - 0.1 K/uL   Immature Granulocytes 1 %   Abs Immature Granulocytes 0.05 0.00 - 0.07 K/uL    Comment: Performed at [facility] [address],  [place], [place] [address]    Medications:  Current Facility-Administered Medications  Medication Dose Route Frequency Provider Last Rate Last Admin  . 0.9 %  sodium chloride infusion (Manually program via Guardrails IV Fluids)   Intravenous Once Wilford Corner, MD      . atorvastatin (LIPITOR) tablet 80 mg  80 mg Oral Daily Wilford Corner, MD      . chlorhexidine (PERIDEX) 0.12 % solution 15 mL  15 mL Mouth Rinse BID Florencia Reasons, MD   15 mL at 04/07/19 2106  . Chlorhexidine Gluconate Cloth 2 % PADS 6 each  6 each Topical Daily Florencia Reasons, MD   6 each at 04/07/19 0800  . [START ON 04/11/2019] Darbepoetin Alfa (ARANESP) injection 100 mcg  100 mcg Intravenous Q Sat-HD Beard, Samantha N, DO      . gentamicin cream (GARAMYCIN) 0.1 % 1 application  1 application Topical Daily Elmarie Shiley, MD      . insulin aspart (novoLOG) injection 0-15 Units  0-15 Units Subcutaneous TID WC Wilford Corner, MD      . levETIRAcetam (KEPPRA) IVPB 500 mg/100 mL premix  500 mg Intravenous Q12H Hoyt Koch, MD 400 mL/hr at 04/08/19 1629 500 mg at 04/08/19 1629  . [START ON 04/09/2019] levETIRAcetam (KEPPRA) IVPB 500 mg/100 mL premix  500 mg Intravenous Q T,Th,Sa-HD Marliss Coots, PA-C      . levothyroxine (SYNTHROID) tablet 200 mcg  200 mcg Oral q morning - 10a Florencia Reasons, MD   200 mcg at 04/07/19 4034  . multivitamin (RENA-VIT) tablet 1 tablet  1 tablet Oral QHS Darrelyn Hillock N, DO   1 tablet at 04/07/19 2106  . naloxone [facility]) injection 0.4 mg  0.4 mg Intramuscular Once Florencia Reasons, MD      . naloxone [facility]) injection 0.4 mg  0.4 mg Intravenous PRN Florencia Reasons, MD      . pantoprazole (PROTONIX) injection 40 mg  40 mg Intravenous Patricia Nettle, MD      .  traZODone (DESYREL) tablet 100 mg  100 mg Oral QHS Wilford Corner, MD   100 mg at 04/07/19 2106    Musculoskeletal: Strength & Muscle Tone: Unable to determine via [place]: Did not see patient ambulate Patient leans: N/A  Psychiatric Specialty Exam: Physical Exam  Nursing note and vitals reviewed. Psychiatric: His speech is normal. Thought content normal. His mood appears anxious. He is agitated. Cognition and memory are normal. He expresses impulsivity.    Review of Systems  Psychiatric/Behavioral: Positive for agitation. Negative for hallucinations, self-injury and suicidal ideas. Confusion: At time. Sleep disturbance: Reports difficulty falling to sleep. Nervous/anxious: Stable.     Blood pressure (!) 160/68, pulse 81, temperature 98.1 F (36.7 C), temperature source Oral, resp. rate 12, weight 96.3 kg, SpO2 96 %.Body mass index is 28.79 kg/m.  General Appearance: Camera not working did not see patients appearence  Eye Contact:  Camera not working  Speech:  Doctor, general practice and Normal Rate  Volume:  Normal  Mood:  Irritable at time but cooperative/calm  Affect:  Appropriate and Congruent  Thought Process:  Coherent and Goal Directed  Orientation:  Full (Time, Place, and Person)  Thought Content:  WDL  Suicidal Thoughts:  No  Homicidal Thoughts:  No  Memory:  Immediate;   Fair Recent;   Fair  Judgement:  Fair  Insight:  Present  Psychomotor Activity:  Normal  Concentration:  Concentration: Fair and Attention Span: Fair  Recall:  Fair  Fund of Knowledge:  Fair  Language:  Good  Akathisia:  No  Handed:  Right  AIMS (if indicated):     Assets:  Communication Skills Desire for Improvement Housing Social Support  ADL's:  Intact  Cognition:  WNL  Sleep:        Treatment Plan Summary: Disposition: No evidence of imminent risk to self or others at present.    Patient alert and oriented x 3, calm and cooperative throughout assessment.  Patient  mood congruent with affect.  Patient denies suicidal/self-harm/homicidal ideation, psychosis, and paranoia.  Patient does not appear to be responding to internal/external stimuli or delusional thoughts.  Patient voiced his understanding of illness and his reasons for refusal of treatment/procedures.  Patient able to process and can make decision with information disclosed to him.  Patient may need hierarchy of need explained in more detail with level of treatment need; and prior to treatment to reinforce need/why/when procedure/treatment will be done.    This service was provided via telemedicine using a 2-way, interactive audio and video technology.  Names of all persons participating in this telemedicine service and their role in this encounter. Name: Earleen Newport NP  Name: Dr. Dwyane Dee Role: Psychiatrist  Name: "Kyle Guadeloupe" Marolyn [name] Role: Patient        Earleen Newport, NP 04/08/2019 5:13 PM

## 2019-04-08 NOTE — Progress Notes (Signed)
EEG complete - results pending 

## 2019-04-08 NOTE — Progress Notes (Signed)
[facility] Progress Note    Assessment/ Plan:    ESRD: Transitioned from PD to HD on 2/2, last session 2/9.  RUE AVF placed on 2/1.  Following T/Th/Sat schedule. Awaiting outpatient HD placement.    Anemia of CKD   Suspected Blood loss anemia: S/p 4 pRBCs. Refused labs this am and did not complete Nulytely. Colonoscopy scheduled for today (2/10) per GI, may change due to above. Receiving Aranesp weekly.   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   Hypertension: Stable. On metoprolol, cont to hold norvasc given likely blood loss.    Nutrition: Albumin 1.9. Renavit MVI daily.    Acute Pontine Stroke: Continued rehabilitation.   T2DM  Hypothyroidism: Per primary.    Subjective:   Had difficult time drinking prepping solution for colonoscopy scheduled today.  Additionally refused labs this morning.  Does not want to talk much this morning.   Objective:   BP 130/63 (BP Location: Left Arm)   Pulse 86   Temp (!) 97.5 F (36.4 C) (Axillary)   Resp 16   Wt 96.3 kg   SpO2 99%   BMI 28.79 kg/m   Intake/Output Summary (Last 24 hours) at 04/08/2019 3976 Last data filed at 04/07/2019 2300 Gross per 24 hour  Intake 990 ml  Output 2000 ml  Net -1010 ml   Weight change:   Physical Exam: General: Resting comfortably Cardiac: RRR no m/g/r Lungs: Clear bilaterally, no increased WOB  Abdomen: soft Msk: Moves all extremities spontaneously  Ext: Warm, dry, no edema  Access: Right IJ tunnel catheter with LUE aVF with palpable thrill, audible bruit  Imaging: CT HEAD WO CONTRAST  Result Date: 04/07/2019 CLINICAL DATA:  New onset neuro changes. Clinical suspicion for acute stroke. EXAM: CT HEAD WITHOUT CONTRAST TECHNIQUE: Contiguous axial images were obtained from the base of the skull through the vertex without intravenous contrast. COMPARISON:  03/26/2019. FINDINGS: Brain: The low density the previously demonstrated at the site of recent infarction in the left  paramedian midbrain is less prominent. 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 No CT evidence of acute infarction. No intracranial hemorrhage or mass lesion. Vascular: No hyperdense vessel or unexpected calcification. Skull: Normal. Negative for fracture or focal lesion. Sinuses/Orbits: Stable mild dysconjugate gaze. Stable chronic soft tissue and calcific density in the left maxillary sinus, suggesting fungal sinusitis. Other: None. IMPRESSION: 1. No acute abnormality. 2. Expected evolution of the previously demonstrated left paramedian midbrain infarction. 3. 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 4. 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 Electronically Signed   By: Claudie Revering M.D.   On: 04/07/2019 12:25    Labs: BMET Recent Labs  Lab 04/02/19 0525 04/03/19 0755 04/04/19 0240 04/04/19 0241 04/05/19 0905 04/06/19 0833 04/07/19 1647  NA 137 139 139 139 138 138 136  K 4.3 4.1 4.7 4.8 4.1 4.3 4.2  CL 103 101 104 103 99 106 107  CO2 22 28 24 24   --  22 20*  GLUCOSE 155* 159* 133* 135* 88 104* 176*  BUN 42* 30* 66* 68* 31* 46* 53*  CREATININE 8.04* 5.32* 6.50* 6.76* 4.20* 5.78* 7.08*  CALCIUM 7.7* 7.8* 7.4* 7.3*  --  6.6* 6.3*  PHOS 3.7 2.3* 2.3*  --   --   --  3.0   CBC Recent Labs  Lab 04/04/19 0241 04/04/19 0241 04/04/19 1543 04/05/19 0237 04/05/19 0905 04/05/19 2035 04/06/19 0833 04/07/19 0645  WBC 11.3*  --  12.8*  --   --   --  6.9 7.3  NEUTROABS 8.1*  --  10.4*  --   --   --  5.2  --   HGB 5.1*   < > 6.1*   < > 6.5* 9.4* 9.0* 8.7*  HCT 16.2*   < > 18.2*   < > 19.0* 27.2* 27.2* 26.3*  MCV 105.9*  --  94.3  --   --   --  93.2 93.3  PLT 226  --  183  --   --   --  124* 136*   < > = values in this interval not displayed.    Medications:    . sodium chloride   Intravenous Once  . atorvastatin  80 mg Oral Daily  . chlorhexidine  15 mL Mouth Rinse BID  . Chlorhexidine Gluconate Cloth  6 each Topical Daily  . [START ON 04/11/2019] darbepoetin (ARANESP) injection -  DIALYSIS  100 mcg Intravenous Q Sat-HD  . insulin aspart  0-15 Units Subcutaneous TID WC  . levothyroxine  200 mcg Oral q morning - 10a  . multivitamin  1 tablet Oral QHS  . naloxone  0.4 mg Intramuscular Once  . pantoprazole  40 mg Intravenous Q12H  . traZODone  100 mg Oral QHS      Darrelyn Hillock, DO Family Medicine PGY-2  04/08/2019, 8:33 AM

## 2019-04-08 NOTE — Progress Notes (Signed)
Lab tech called this RN that pt declined lab draw this morning. RN went in the room and explained to patient that lab is needed to monitor for bleeding and prevent complication associated with it. Patient verbalize understanding, but still declined it ". Lab will try later.  Patient also stated " hell no" when asked if he would try prep today for colonoscopy. Patient is A&O x4

## 2019-04-08 NOTE — Progress Notes (Signed)
Patient initially willing to try to drink 4000cc Nylytley for preparation for colonoscopy.Patient drank a couple cups then refused. Nurse explained that test could not be done if prep was not completed therefore source of bleeding can not be located which is dangerous.   Page to [facility]. Sharlet Salina on call for [facility] to inform patient will not drink Nylytley. Sharlet Salina returned call and made awake that patient refused to 2 nurses and is aware of associated risks. Patient is also refusing SCDs, and c-pap.

## 2019-04-08 NOTE — Procedures (Signed)
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 EEG to evaluate for seizure.  Level of alertness: awake, asleep  AEDs during EEG study: LEV  Technical aspects: This EEG study was done with scalp electrodes positioned according to the 10-20 International system of electrode placement. Electrical activity was acquired at a sampling rate of 500Hz  and reviewed with a high frequency filter of 70Hz  and a low frequency filter of 1Hz . EEG data were recorded continuously and digitally stored.   DESCRIPTION: The posterior dominant rhythm consists of 7.5Hz  activity of moderate voltage (25-35 uV) seen predominantly in posterior head regions, symmetric and reactive to eye opening and eye closing. Sleep was characterized by vertex waves.  Hyperventilation and photic stimulation were not performed.  IMPRESSION: This study is within normal limits. No seizures or epileptiform discharges were seen throughout the recording.  Kamaryn Grimley Barbra Sarks

## 2019-04-08 NOTE — Progress Notes (Signed)
Patient has been more corporative as the day has progressed.  Able to turn lights on in room without patient yelling to turn them off and patient allowed me to administer his IV meds.  Mood and cognition appear much different/improved from early morning.  Patient seems to become more alert around 1400 today.

## 2019-04-08 NOTE — Consult Note (Addendum)
Neurology Consultation  Reason for Consult: Intermittent aggressiveness and altered mental status Referring Physician: Kerney Elbe, DO  CC: Agitation  History is obtained from: Nurse and wife  HPI: Tiernan Suto is a 69 y.o. male with history of seizures, hypothyroidism, hypertension, hyperlipidemia, diabetes, chronic kidney disease initially on peritoneal dialysis however while hospitalized changed to hemodialysis.  While hospitalized recently has been noted to become aggressive every other day which per wife is totally not himself.  For this reason neurology was asked to see patient and evaluate for any neurological causes.  Patient was in [place] but then transferred back to [facility] secondary to having anemia with dropping hemoglobin.  Patient needed transfusion on 03/30/2019 with further decline.  He was noted to have black and red stools along with coffee-ground emesis.  Due to melanic stools and coffee-ground emesis there was concern by GI for peptic ulcer source.  On 04/05/2019 patient had an upper endoscopy which was normal.  For that reason GI desired to have a colonoscopy.  Patient receives his dialysis on Saturday Tuesdays and Thursdays.  Per wife patient was his normal self Sunday 04/05/2019 then aggressive and refusing medications on 04/06/2019, normal on 04/07/2019 and today was found to be aggressive again on 11/2019.  At current time patient is resting in bed, does not appear to be aggressive and follows commands.  He states he does not recall being belligerent and or refusing medications.  Wife states that he is usually very stable and does not wax and wane with his demeanor until recently.  She is very worried that there possibly could be another stroke.  There is significant frustration due to the unclear etiology of his waxing and waning aggression.    Past neurological encounters - Patient was seen at [facility] on 03/16/2019 for left ptosis.  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   Past Medical History:  Diagnosis Date  . Anxiety   . BPH (benign prostatic hyperplasia)   . Chronic kidney disease   . Colon polyps   . COPD (chronic obstructive pulmonary disease) ([place])   . Depression   . Diabetes mellitus without complication ([place])   . Diverticulosis of colon   . Hyperlipidemia   . Hypertension   . Hypothyroidism   . Nephrolithiasis   . Seizures ([place])     Family History  Problem Relation Age of Onset  . Diabetes Mother   . Diabetes Maternal Grandmother   . Diabetes Maternal Grandfather   . Lung cancer Father   . Emphysema Paternal Grandfather    Social History:   reports that he has been smoking cigarettes. He has a 40.00 pack-year smoking history. He has never used smokeless tobacco. He reports current alcohol use of about 8.0 standard drinks of alcohol per week. He reports that he does not use drugs.  Medications  Current Facility-Administered Medications:  .  0.9 %  sodium chloride infusion (Manually program via Guardrails IV Fluids), , Intravenous, Once, Wilford Corner, MD .  atorvastatin (LIPITOR) tablet 80 mg, 80 mg, Oral, Daily, Wilford Corner, MD .  chlorhexidine (PERIDEX) 0.12 % solution 15 mL, 15 mL, Mouth Rinse, BID, Florencia Reasons, MD, 15 mL at 04/07/19 2106 .  Chlorhexidine Gluconate Cloth 2 % PADS 6 each, 6 each, Topical, Daily, Florencia Reasons, MD, 6 each at 04/07/19 0800 .  [START ON 04/11/2019] Darbepoetin Alfa (ARANESP) injection 100  mcg, 100 mcg, Intravenous, Q Sat-HD, Beard, Samantha N, DO .  insulin aspart (novoLOG) injection 0-15 Units, 0-15 Units, Subcutaneous, TID WC, Wilford Corner, MD .  levETIRAcetam (KEPPRA) IVPB 500 mg/100 mL premix, 500 mg, Intravenous, Q12H, Hoyt Koch, MD, Last Rate: 400 mL/hr at 04/08/19 0558, 500 mg at 04/08/19 0558 .  levothyroxine (SYNTHROID) tablet 200 mcg, 200 mcg, Oral, q  morning - 10a, Florencia Reasons, MD, 200 mcg at 04/07/19 8299 .  multivitamin (RENA-VIT) tablet 1 tablet, 1 tablet, Oral, QHS, Beard, Samantha N, DO, 1 tablet at 04/07/19 2106 .  naloxone [facility]) injection 0.4 mg, 0.4 mg, Intramuscular, Once, Florencia Reasons, MD .  naloxone [facility]) injection 0.4 mg, 0.4 mg, Intravenous, PRN, Florencia Reasons, MD .  pantoprazole (PROTONIX) injection 40 mg, 40 mg, Intravenous, Q12H, Wilford Corner, MD .  traZODone (DESYREL) tablet 100 mg, 100 mg, Oral, QHS, Wilford Corner, MD, 100 mg at 04/07/19 2106  ROS:    General ROS: negative for - chills, fatigue, fever, night sweats, weight gain or weight loss Psychological ROS: negative for - behavioral disorder, hallucinations, memory difficulties, mood swings or suicidal ideation Ophthalmic ROS: negative for - blurry vision, double vision, eye pain or loss of vision ENT ROS: negative for - epistaxis, nasal discharge, oral lesions, sore throat, tinnitus or vertigo Respiratory ROS: negative for - cough, hemoptysis, shortness of breath or wheezing Cardiovascular ROS: negative for - chest pain, dyspnea on exertion, edema or irregular heartbeat Gastrointestinal ROS: negative for - abdominal pain, diarrhea, hematemesis, nausea/vomiting or stool incontinence Genito-Urinary ROS: negative for - dysuria, hematuria, incontinence or urinary frequency/urgency Musculoskeletal ROS: negative for - joint swelling or muscular weakness Neurological ROS: as noted in HPI Dermatological ROS: negative for rash and skin lesion changes  Exam: Current vital signs: BP (!) 154/57 (BP Location: Right Leg)   Pulse (!) 135   Temp 97.8 F (36.6 C) (Axillary)   Resp (!) 24   Wt 96.3 kg   SpO2 98%   BMI 28.79 kg/m  Vital signs in last 24 hours: Temp:  [97.5 F (36.4 C)-98.6 F (37 C)] 97.8 F (36.6 C) (02/10 1130) Pulse Rate:  [72-135] 135 (02/10 1130) Resp:  [13-24] 24 (02/10 1130) BP: (130-173)/(57-91) 154/57 (02/10 1130) SpO2:  [97 %-100 %] 98 %  (02/10 1130) Weight:  [96.3 kg-99 kg] 96.3 kg (02/09 1728)   Constitutional: Appears well-developed and well-nourished.  Psych: Depressed Eyes: No scleral injection HENT: No OP obstrucion Head: Normocephalic.  Cardiovascular: Normal rate and regular rhythm.  Respiratory: Effort normal, non-labored breathing GI: Soft.  No distension. There is no tenderness.  Skin: WDI with multiple bruises  Neuro: Mental Status: Patient is awake, alert, oriented to person, place, month, year, and situation. Speech-normal to naming, repeating, comprehension Cranial Nerves: II: Visual Fields are full.  III,IV, VI: EOMI in the right eye.  Left eye ptosis and will not allow me to full exam of the left eye . Pupils equal, round and reactive to light V: Facial sensation is symmetric to temperature VII: Facial movement is symmetric.  VIII: hearing is intact to voice X: Palat elevates symmetrically XI: Shoulder shrug is symmetric. XII: tongue is midline without atrophy or fasciculations.  Motor: Tone is normal. Bulk is normal. 5/5 strength was present in all four extremities.  No drift No aterixis Sensory: Sensation is symmetric to light touch and temperature in the arms and legs. Deep Tendon Reflexes: 2+ and symmetric in the biceps and patellae.  Plantars: Toes are downgoing bilaterally.  Cerebellar:  FNF intact  Labs I have reviewed labs in epic and the results pertinent to this consultation are:   CBC    Component Value Date/Time   WBC 7.3 04/07/2019 0645   RBC 2.82 (L) 04/07/2019 0645   HGB 8.7 (L) 04/07/2019 0645   HGB 14.2 08/08/2013 1947   HCT 26.3 (L) 04/07/2019 0645   HCT 42.1 08/08/2013 1947   PLT 136 (L) 04/07/2019 0645   PLT 144 (L) 08/08/2013 1947   MCV 93.3 04/07/2019 0645   MCV 92 08/08/2013 1947   MCH 30.9 04/07/2019 0645   MCHC 33.1 04/07/2019 0645   RDW 15.9 (H) 04/07/2019 0645   RDW 12.9 08/08/2013 1947   LYMPHSABS 0.9 04/06/2019 0833   MONOABS 0.5 04/06/2019  0833   EOSABS 0.2 04/06/2019 0833   BASOSABS 0.1 04/06/2019 0833    CMP     Component Value Date/Time   NA 136 04/07/2019 1647   NA 138 08/08/2013 1947   K 4.2 04/07/2019 1647   K 3.9 08/08/2013 1947   CL 107 04/07/2019 1647   CL 107 08/08/2013 1947   CO2 20 (L) 04/07/2019 1647   CO2 21 08/08/2013 1947   GLUCOSE 176 (H) 04/07/2019 1647   GLUCOSE 100 (H) 08/08/2013 1947   BUN 53 (H) 04/07/2019 1647   BUN 32 (H) 08/08/2013 1947   CREATININE 7.08 (H) 04/07/2019 1647   CREATININE 2.04 (H) 08/08/2013 1947   CALCIUM 6.3 (LL) 04/07/2019 1647   CALCIUM 9.1 08/08/2013 1947   PROT 4.3 (L) 04/04/2019 0241   PROT 6.7 08/08/2013 1947   ALBUMIN 1.9 (L) 04/07/2019 1647   ALBUMIN 3.4 08/08/2013 1947   AST 21 04/04/2019 0241   AST 30 08/08/2013 1947   ALT 11 04/04/2019 0241   ALT 22 08/08/2013 1947   ALKPHOS 50 04/04/2019 0241   ALKPHOS 72 08/08/2013 1947   BILITOT 0.4 04/04/2019 0241   BILITOT 0.3 08/08/2013 1947   GFRNONAA 7 (L) 04/07/2019 1647   GFRNONAA 33 (L) 08/08/2013 1947   GFRAA 8 (L) 04/07/2019 1647   GFRAA 38 (L) 08/08/2013 1947    Lipid Panel     Component Value Date/Time   CHOL 130 03/16/2019 0533   TRIG 207 (H) 03/16/2019 0533   HDL 41 03/16/2019 0533   CHOLHDL 3.2 03/16/2019 0533   VLDL 41 (H) 03/16/2019 0533   LDLCALC 48 03/16/2019 0533     Imaging I have reviewed the images obtained:  CT-scan of the brain-no acute abnormality.  Expected evolution of previously demonstrated left paramedian midbrain infarct.  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   Etta Quill PA-C [facility] [phone]  M-F  (9:00 am- 5:00 PM)  04/08/2019, 1:03 PM    NEUROHOSPITALIST ADDENDUM Performed a face to face diagnostic evaluation.   I have reviewed the contents of history and physical exam as documented by PA/ARNP/Resident and agree with above documentation.  I have discussed and formulated the above plan as documented. Edits to the note have been made as  needed.  69 year old male with past medical history of seizures on Keppra, hypertension, hyperlipidemia, diabetes mellitus on dialysis with recent midbrain infarct admitted at [facility] transferred to [place], developed coffee-ground emesis: GI planning for colonoscopy however patient refused prep last night. Recently switched to HD from peritoneal dialysis as he needs to go to SNF. Patient has been agitated, combative and rude to staff which is atypical for him.  Neurology consulted.  Spoke with the nurse, as well as PA spoke with wife.  According to nurse, patient was alert and oriented; just extremely rude, combative and refuses any procedures.  Patient does not recollect and unaware of this.   On my assessment patient is alert oriented x4, can say recent events such as who won the [employer].  Patient has left eye ptosis and complains of blurry vision in the left eye.  No other neurological deficits with normal motor strength and sensory exam  MRI brain has been performed: Negative for acute infarct.  EEG has been performed: No epileptiform discharges.     Impression: Aggressive behavior and agitation, with periods where patient gets his normal self.  This is occurring only in the past few days.  No clear triggering event such as dialysis.  Suspect delirium.  Would discontinue trazodone while he is in the hospital.  Continue Keppra for now, give additional dose of the dialysis-I do not think these episodes reflect postictal psychosis. MRI brain negative for new stroke.  Midbrain infarct can affect memory, however this apparently not an issue the last 1 to 2 weeks following stroke.  Post stroke depression however is common and should be in the differential.  Patient does describe difficulty with sleep.  If he is agitated more so in the morning I suspect that this may be related.  I wil reduce trazadone to 50 mg as this can cause agitation.    Delirium in setting acute blood loss anemia, hospitalization,  recently starting dialysis Behavioral disturbance   Recommendation -Continue Keppra 500 mg BID, give additional dose after dialysis -Agree with holding antiplatelets until cleared from GI, would also not start patient back on dual antiplatelet therapy.  Can either do aspirin or Plavix.  If Plavix initiated, would chose PPI other than omeprazole as this can effect efficacy of Plavix.  -reduce trazadone to 50mg  QHS    Karena Addison Vanessa Kampf MD [facility] [phone]   If 7pm to 7am, please call on call as listed on AMION.

## 2019-04-08 NOTE — Progress Notes (Signed)
PT Cancellation Note  Patient Details Name: Kyle [name] MRN: [id number] DOB: 04-21-1950   Cancelled Treatment:    Reason Eval/Treat Not Completed: Patient declined, no reason specified.  Pt declined this morning, "I'm not getting up..."   Will try back later as able. 04/08/2019  Ginger Carne., PT [facility] [phone]  (pager) [phone]  (office)    Tessie Fass Ramez Arrona 04/08/2019, 10:34 AM

## 2019-04-08 NOTE — Progress Notes (Signed)
Entered patient's room to introduce myself and to complete initial assessment.  Patient initially refused to answer any questions and demanded that I "shut the fuck up and get the fuck out".  Patient continued to scream at me the whole time I was in his room. Patient did finally tell me his birthday, that he was in [facility], the current month and year and that he was here for a stroke.  Patient refused to let me assess him except to briefly listen to heart and lung sounds.  Patient also refused to have labs drawn this morning and also refused to drink bowel prep for scheduled colon today.  Almyra Free in Endo advised patient refused prep.  She contacted physician and called back to advise me they were cancelling the procedure due to patient refusing prep.    I will continue to educate patient regarding importance of allowing care while he is here, explaining the potential risks by refusing care (such as labs to check Hgb).

## 2019-04-08 NOTE — Progress Notes (Signed)
Patient refusing BiPAP. Patient states he doesn't wear one at home and has never had a sleep study. Informed patient that if he changes his mind to let us know. Vitals stable. 94% RA. Will continue to monitor.

## 2019-04-08 NOTE — Progress Notes (Signed)
This note also relates to the following rows which could not be included: ECG Heart Rate - Cannot attach notes to unvalidated device data  Assessment was limited due to patient's refusal

## 2019-04-08 NOTE — Progress Notes (Signed)
PROGRESS NOTE    Kyle [name]  ZOX:[id number] DOB: 06-04-1950 DOA: 04/04/2019 PCP: Idelle Crouch, MD   Brief Narrative:  The patient is a 69 year old Caucasian Male who was Patient was discharged from [facility] on 1/21 to CIR at [facility] after being treated for acute left midbrain stroke. He subsequently developed GI bleed(acute onset of black and red stools X 5 overnight and also vomited coffee grounds one timewhile at rehab)and acute blood loss anemia and was transferred from Rehab and admitted to [facility] on 2/6. He has ESRD and is being dialyzed. Nephrology and GI following: plan for colonoscopy 04/08/19 however patient refused prep last night and has been refusing labwork and intervention and this AM has been combative and rude to staff. Will consult Neurology, Psychiatry, and repeat an MRI of the brain and EEG if possible.   Assessment & Plan:   Principal Problem:   GI bleed Active Problems:   Chronic obstructive pulmonary disease (HCC)   Diabetes mellitus (HCC)   Insulin dependent type 2 diabetes mellitus (HCC)   ESRD (end stage renal disease) (HCC)   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HCC)   Acute upper GI bleed  GI bleed/Acute blood loss anemia superimposed on chronic anemia of kidney disease -S/p EGDno bleeding source find in EGDwith duodenitis and gastritis, patient has a colonoscopy in 11/2018 "adenomatous polyps and a tubulovillous adenoma were removed and extensive diverticulosis was noted" -He denies Abdominal Pain but now refusing intervention  -S/p Transfusion of 2 units of pRBC's, appropriate increase to Hg 8.7 yesterday but refused lab work this AM  (up from 6.5 prior to [place]) -Nursing stated he had more bloody/melanotic BM the day before yesterday/yesterday but none overnight  -GI recommend continue PPIdrip through 04/07/19 then transition to IV BID PPI -GI was do colonoscopy 04/08/19 but cancelled as patient did not drink prep; Placed on CLD now -GI will follow -Patient  is more combative and agitated so will obtain Brain MRI and call Psych   HTN: -Currently stable and is 130/63 -Continue to Hold home bp meds includingnorvasc, cozaar -Change toprol-xl to low dose lopressor with holding parameters  CVA withdifficulty with balance and ptosis of left eyelid. -He was on asa/plavix for three wks then asa alone -Mental status improved yesterday but, checking CT head after further discussion with wife behavior yesterday was not normal -Repeat Head CT showed "No acute abnormality. Expected evolution of the previously demonstrated left paramedian midbrain infarction. 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." -Now he remains agitated and combative and refusing interventions -Repeat Brain MRI w/o Contrast and Consult Psych for behavioral issues and Capacity Evaluation and have also asked neurology to come back -Hold ASA in the setting of gi bleed, continueStatin -Neurology to see today and ordering a head MRI and EEG for further evaluation  Seizure Disorder  -On Keppra chronically -IV currently as he had refused meds; Continue Levitracetam 500 mg IV q12h -Check EEG  Insulin DependentDM2 -He was discharged on insulin (lantus and ssi) from recent hospitalization -CBGs ranging from 91-145 -Continue with Moderate NovoLog/scale insulin before meals and at bedtime  AMS/Encephalopathy -Patient is alert and oriented however wife states that this is a total change from his baseline -Check blood cultures x2, urinalysis and urine culture as well as chest x-ray next-check head MRI and EEG  -Neurology and psychiatry consulted for further evaluation -Patient further evaluation recommendations  ESRD was on peritoneal dialysis transitioned to HD on 2/2 Metabolic Acidosis,  mild  -Status post RIJ TDC and left BCAVF placed by Dr. Carlis Abbott 2/1. -Nephrology consulted for further evaluation -Received HD  04/07/19 -Discussed with Nephrology and they will obtain a PD sample to r/o Infection  -Patient's BUN/Cr went from 46/5.78 -> 53/7.08 -Other nephrotoxic medications, contrast dyes, hypotension and renally adjust medication  -Continue monitor trend renal function repeat CMP in a.m.  COPD -Not home o2 dependent, -No wheezing on exam, no hypoxia  Chronic RBBB -EKG stable  Hypothyroidism -Continue Levothyroxine 200 mcg po Daily but may need to change to IV since he has been refusing -Check TSH   Thrombocytopenia -In the setting of his bleeding  -Patient's platelet count went from 124,000 is now 136,000 -Continue to monitor for signs and symptoms of bleeding -Repeat CBC in a.m.  DVT prophylaxis: SCDs given his recent GI bleeding Code Status: FULL CODE Family Communication: Discussed with wife [name]) via telephone extensively  Disposition Plan: Patient is from [place] but is acutely confused and combative now and having a GI bleed and require inpatient hospitalization for further colonoscopy when he is not as combative and will need to ensure his hemoglobin/hematocrit is stable.  Because of his acute mental status changes neurology has been consulted as well as psychiatry and they will need to clear the patient prior to discharge back to CIR  Consultants:   Gastroenterology  Nephrology  Neurology  Psychiatry   Procedures:  EGD on 04/05/2019 Findings:      The examined esophagus was normal.      The Z-line was regular and was found 42 cm from the incisors.      Localized moderate inflammation characterized by congestion (edema),       erosions and erythema was found in the prepyloric region of the stomach.      The cardia and gastric fundus were normal on retroflexion.      Patchy severe mucosal changes characterized by congestion, erythema,       erosion and nodularity were found in the duodenal bulb. Biopsies were       taken with a cold forceps for histology. Estimated  blood loss was       minimal.      The second portion of the duodenum was normal. Impression:               - Normal esophagus.                           - Z-line regular, 42 cm from the incisors.                           - Gastritis.                           - Mucosal changes in the duodenum. Biopsied.                           - Normal second portion of the duodenum.   Antimicrobials:  Anti-infectives (From admission, onward)   None     Subjective: Seen and examined at bedside he is withdrawn and did not really want to speak to me and wanted to sleep and refusal of labs and was cussing at nursing staff.  Denied chest pain, lightheadedness or dizziness but did not want to interact very much.  Objective: Vitals:   04/07/19  2005 04/07/19 2300 04/07/19 2341 04/08/19 0424  BP: (!) 143/67  (!) 155/63 (!) 153/77  Pulse: 95 90 77 80  Resp: 13 18 13 14   Temp: 98 F (36.7 C) 97.8 F (36.6 C)  97.8 F (36.6 C)  TempSrc: Oral Oral  Oral  SpO2: 97% 100% 99% 100%  Weight:        Intake/Output Summary (Last 24 hours) at 04/08/2019 9417 Last data filed at 04/07/2019 2300 Gross per 24 hour  Intake 990 ml  Output 2000 ml  Net -1010 ml   Filed Weights   04/07/19 1320 04/07/19 1728  Weight: 99 kg 96.3 kg   Examination: Physical Exam:  Constitutional: WN/WD overweight Caucasian male currently appears agitated Eyes: Lids and conjunctivae normal, sclerae anicteric  ENMT: External Ears, Nose appear normal. Grossly normal hearing. .  Neck: Appears normal, supple, no cervical masses, normal ROM, no appreciable thyromegaly; no JVD Respiratory: Diminished to auscultation bilaterally, no wheezing, rales, rhonchi or crackles. Normal respiratory effort and patient is not tachypenic.  Cardiovascular: RRR, no murmurs / rubs / gallops. S1 and S2 auscultated.  Abdomen: Soft, non-tender, slightly distended secondary body habitus.  Bowel sounds positive.  GU: Deferred. Musculoskeletal: No clubbing  / cyanosis of digits/nails. No joint deformity upper and lower extremities.  Skin: No rashes, lesions, ulcers on limited skin evaluation. No induration; Warm and dry.  Neurologic: CN 2-12 grossly intact with no focal deficits.  Romberg sign cerebellar and reflexes not assessed.  Psychiatric: Impaired judgment and insight. Alert and oriented x 3. Agitated mood and appropriate affect.   Data Reviewed: I have personally reviewed following labs and imaging studies  CBC: Recent Labs  Lab 04/02/19 0629 04/02/19 0629 04/04/19 0241 04/04/19 0241 04/04/19 1543 04/04/19 1543 04/05/19 0237 04/05/19 0905 04/05/19 2035 04/06/19 0833 04/07/19 0645  WBC 7.8  --  11.3*  --  12.8*  --   --   --   --  6.9 7.3  NEUTROABS  --   --  8.1*  --  10.4*  --   --   --   --  5.2  --   HGB 7.2*   < > 5.1*   < > 6.1*   < > 8.1* 6.5* 9.4* 9.0* 8.7*  HCT 21.9*   < > 16.2*   < > 18.2*   < > 23.5* 19.0* 27.2* 27.2* 26.3*  MCV 100.5*  --  105.9*  --  94.3  --   --   --   --  93.2 93.3  PLT 201  --  226  --  183  --   --   --   --  124* 136*   < > = values in this interval not displayed.   Basic Metabolic Panel: Recent Labs  Lab 04/02/19 0525 04/02/19 0525 04/03/19 0755 04/03/19 0755 04/04/19 0240 04/04/19 0241 04/05/19 0905 04/06/19 0833 04/07/19 1647  NA 137   < > 139   < > 139 139 138 138 136  K 4.3   < > 4.1   < > 4.7 4.8 4.1 4.3 4.2  CL 103   < > 101   < > 104 103 99 106 107  CO2 22   < > 28  --  24 24  --  22 20*  GLUCOSE 155*   < > 159*   < > 133* 135* 88 104* 176*  BUN 42*   < > 30*   < > 66* 68* 31* 46* 53*  CREATININE 8.04*   < > 5.32*   < > 6.50* 6.76* 4.20* 5.78* 7.08*  CALCIUM 7.7*   < > 7.8*  --  7.4* 7.3*  --  6.6* 6.3*  PHOS 3.7  --  2.3*  --  2.3*  --   --   --  3.0   < > = values in this interval not displayed.   GFR: Estimated Creatinine Clearance: 11.7 mL/min (A) (by C-G formula based on SCr of 7.08 mg/dL (H)). Liver Function Tests: Recent Labs  Lab 04/02/19 0525  04/03/19 0755 04/04/19 0240 04/04/19 0241 04/07/19 1647  AST  --   --   --  21  --   ALT  --   --   --  11  --   ALKPHOS  --   --   --  50  --   BILITOT  --   --   --  0.4  --   PROT  --   --   --  4.3*  --   ALBUMIN 1.9* 2.0* 1.9* 1.9* 1.9*   No results for input(s): LIPASE, AMYLASE in the last 168 hours. No results for input(s): AMMONIA in the last 168 hours. Coagulation Profile: No results for input(s): INR, PROTIME in the last 168 hours. Cardiac Enzymes: No results for input(s): CKTOTAL, CKMB, CKMBINDEX, TROPONINI in the last 168 hours. BNP (last 3 results) No results for input(s): PROBNP in the last 8760 hours. HbA1C: No results for input(s): HGBA1C in the last 72 hours. CBG: Recent Labs  Lab 04/07/19 1108 04/07/19 1757 04/07/19 2005 04/07/19 2339 04/08/19 0729  GLUCAP 150* 91 145* 117* 93   Lipid Profile: No results for input(s): CHOL, HDL, LDLCALC, TRIG, CHOLHDL, LDLDIRECT in the last 72 hours. Thyroid Function Tests: No results for input(s): TSH, T4TOTAL, FREET4, T3FREE, THYROIDAB in the last 72 hours. Anemia Panel: No results for input(s): VITAMINB12, FOLATE, FERRITIN, TIBC, IRON, RETICCTPCT in the last 72 hours. Sepsis Labs: No results for input(s): PROCALCITON, LATICACIDVEN in the last 168 hours.  Recent Results (from the past 240 hour(s))  Surgical pcr screen     Status: None   Collection Time: 04/04/19  8:22 PM   Specimen: Nasal Mucosa; Nasal Swab  Result Value Ref Range Status   MRSA, PCR NEGATIVE NEGATIVE Final   Staphylococcus aureus NEGATIVE NEGATIVE Final    Comment: (NOTE) The Xpert SA Assay (FDA approved for NASAL specimens in patients 71 years of age and older), is one component of a comprehensive surveillance program. It is not intended to diagnose infection nor to guide or monitor treatment. Performed at [facility], [place] [address], [place], [place] [address]      RN Pressure Injury Documentation:     Estimated body mass index  is 28.79 kg/m as calculated from the following:   Height as of 03/20/19: 6' (1.829 m).   Weight as of this encounter: 96.3 kg.  Malnutrition Type:      Malnutrition Characteristics:      Nutrition Interventions:     Radiology Studies: CT HEAD WO CONTRAST  Result Date: 04/07/2019 CLINICAL DATA:  New onset neuro changes. Clinical suspicion for acute stroke. EXAM: CT HEAD WITHOUT CONTRAST TECHNIQUE: Contiguous axial images were obtained from the base of the skull through the vertex without intravenous contrast. COMPARISON:  03/26/2019. FINDINGS: Brain: The low density the previously demonstrated at the site of recent infarction in the left paramedian midbrain is less prominent. 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 No CT  evidence of acute infarction. No intracranial hemorrhage or mass lesion. Vascular: No hyperdense vessel or unexpected calcification. Skull: Normal. Negative for fracture or focal lesion. Sinuses/Orbits: Stable mild dysconjugate gaze. Stable chronic soft tissue and calcific density in the left maxillary sinus, suggesting fungal sinusitis. Other: None. IMPRESSION: 1. No acute abnormality. 2. Expected evolution of the previously demonstrated left paramedian midbrain infarction. 3. 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 4. 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 Electronically Signed   By: Claudie Revering M.D.   On: 04/07/2019 12:25   Scheduled Meds: . sodium chloride   Intravenous Once  . atorvastatin  80 mg Oral Daily  . chlorhexidine  15 mL Mouth Rinse BID  . Chlorhexidine Gluconate Cloth  6 each Topical Daily  . [START ON 04/11/2019] darbepoetin (ARANESP) injection - DIALYSIS  100 mcg Intravenous Q Sat-HD  . insulin aspart  0-15 Units Subcutaneous TID WC  . levothyroxine  200 mcg Oral q morning - 10a  . multivitamin  1 tablet Oral QHS  . naloxone  0.4 mg Intramuscular Once  . pantoprazole  40 mg Intravenous Q12H  . traZODone  100 mg Oral QHS   Continuous  Infusions: . levETIRAcetam 500 mg (04/08/19 0558)    LOS: 4 days   Kerney Elbe, DO [facility] PAGER is on [place]  If 7PM-7AM, please contact night-coverage [email or web address]

## 2019-04-08 NOTE — Progress Notes (Signed)
UNASSIGNED PATIENT Subjective: Patient was supposed to have a colonoscopy today but he refused to drink the prep last night. As he has ongoing changes in his mental status and EEG and MRI been scheduled for him today.  He has been very agitated and abusive to the staff today. He has agreed to drink the prep but as he has several tests scheduled drawn this afternoon we will postpone this for tomorrow. There is no evidence of any ongoing GI bleeding at this time  Objective: Vital signs in last 24 hours: Temp:  [97.5 F (36.4 C)-98.6 F (37 C)] 97.8 F (36.6 C) (02/10 1130) Pulse Rate:  [72-135] 135 (02/10 1130) Resp:  [13-24] 24 (02/10 1130) BP: (130-173)/(57-91) 154/57 (02/10 1130) SpO2:  [97 %-100 %] 98 % (02/10 1130) Weight:  [96.3 kg-99 kg] 96.3 kg (02/09 1728) Last BM Date: 04/07/19  Intake/Output from previous day: 02/09 0701 - 02/10 0700 In: 990 [P.O.:990] Out: 2000  Intake/Output this shift: No intake/output data recorded.  General appearance: alert, cooperative, appears stated age, no distress, moderately obese and pale; he has ptosis of his left Resp: clear to auscultation bilaterally Cardio: regular rate and rhythm, S1, S2 normal, no murmur, click, rub or gallop GI: soft, non-tender; bowel sounds normal; no masses,  no organomegaly Extremities: extremities normal, atraumatic, no cyanosis or edema  Lab Results: Recent Labs    04/05/19 2035 04/06/19 0833 04/07/19 0645  WBC  --  6.9 7.3  HGB 9.4* 9.0* 8.7*  HCT 27.2* 27.2* 26.3*  PLT  --  124* 136*   BMET Recent Labs    04/06/19 0833 04/07/19 1647  NA 138 136  K 4.3 4.2  CL 106 107  CO2 22 20*  GLUCOSE 104* 176*  BUN 46* 53*  CREATININE 5.78* 7.08*  CALCIUM 6.6* 6.3*   LFT Recent Labs    04/07/19 1647  ALBUMIN 1.9*    Studies/Results: CT HEAD WO CONTRAST  Result Date: 04/07/2019 CLINICAL DATA:  New onset neuro changes. Clinical suspicion for acute stroke. EXAM: CT HEAD WITHOUT CONTRAST TECHNIQUE:  Contiguous axial images were obtained from the base of the skull through the vertex without intravenous contrast. COMPARISON:  03/26/2019. FINDINGS: Brain: The low density the previously demonstrated at the site of recent infarction in the left paramedian midbrain is less prominent. 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 No CT evidence of acute infarction. No intracranial hemorrhage or mass lesion. Vascular: No hyperdense vessel or unexpected calcification. Skull: Normal. Negative for fracture or focal lesion. Sinuses/Orbits: Stable mild dysconjugate gaze. Stable chronic soft tissue and calcific density in the left maxillary sinus, suggesting fungal sinusitis. Other: None. IMPRESSION: 1. No acute abnormality. 2. Expected evolution of the previously demonstrated left paramedian midbrain infarction. 3. 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 4. 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 Electronically Signed   By: Claudie Revering M.D.   On: 04/07/2019 12:25   DG CHEST PORT 1 VIEW  Result Date: 04/08/2019 CLINICAL DATA:  Shortness of breath EXAM: PORTABLE CHEST 1 VIEW COMPARISON:  03/29/2018 FINDINGS: Cardiac shadow is mildly enlarged but stable. Dialysis catheter is again seen in satisfactory position. The lungs are clear bilaterally. Old rib fracture with healing on the left is seen. IMPRESSION: No acute abnormality noted. Electronically Signed   By: [name] M.D.   On: 04/08/2019 12:40   Medications: I have reviewed the patient's current medications.  Assessment/Plan: 1) Rectal bleeding with anemia-hopefully the patient can be prepped tomorrow for colonoscopy on Friday. Hemoglobin is stable  at 8.8 g/dL today.  He has a history of diverticulosis and colonic polyps. 2) History of recent midbrain infarct and history of seizures on Keppra. 3) Hypertension/Hyperlipidemia/AODM. 4) CKD was on peritoneal dialysis now on hemodialysis.     LOS: 4 days   Juanita Craver 04/08/2019, 1:04 PM

## 2019-04-09 ENCOUNTER — Inpatient Hospital Stay (HOSPITAL_COMMUNITY): Payer: Medicare PPO | Admitting: Certified Registered Nurse Anesthetist

## 2019-04-09 ENCOUNTER — Encounter (HOSPITAL_COMMUNITY)
Admission: AD | Disposition: A | Discharge status: Skilled Nursing Facility | Payer: Self-pay | Source: Ambulatory Visit | Attending: Internal Medicine

## 2019-04-09 HISTORY — PX: HOT HEMOSTASIS: SHX5433

## 2019-04-09 HISTORY — PX: COLONOSCOPY WITH PROPOFOL: SHX5780

## 2019-04-09 LAB — GLUCOSE, CAPILLARY
Glucose-Capillary: 99 mg/dL (ref 70–99)
Glucose-Capillary: 95 mg/dL (ref 70–99)
Glucose-Capillary: 100 mg/dL — ABNORMAL HIGH (ref 70–99)

## 2019-04-09 LAB — COMPREHENSIVE METABOLIC PANEL
ALT: 15 U/L (ref 0–44)
AST: 21 U/L (ref 15–41)
Albumin: 2.2 g/dL — ABNORMAL LOW (ref 3.5–5.0)
Alkaline Phosphatase: 68 U/L (ref 38–126)
Anion gap: 13 (ref 5–15)
BUN: 22 mg/dL (ref 8–23)
CO2: 23 mmol/L (ref 22–32)
Calcium: 7.4 mg/dL — ABNORMAL LOW (ref 8.9–10.3)
Chloride: 104 mmol/L (ref 98–111)
Creatinine, Ser: 5.5 mg/dL — ABNORMAL HIGH (ref 0.61–1.24)
GFR calc Af Amer: 11 mL/min — ABNORMAL LOW (ref >60)
GFR calc non Af Amer: 10 mL/min — ABNORMAL LOW (ref >60)
Glucose, Bld: 112 mg/dL — ABNORMAL HIGH (ref 70–99)
Potassium: 4.2 mmol/L (ref 3.5–5.1)
Sodium: 140 mmol/L (ref 135–145)
Total Bilirubin: 0.9 mg/dL (ref 0.3–1.2)
Total Protein: 4.7 g/dL — ABNORMAL LOW (ref 6.5–8.1)

## 2019-04-09 LAB — CBC WITH DIFFERENTIAL/PLATELET
Abs Immature Granulocytes: 0.05 10*3/uL (ref 0.00–0.07)
Basophils Absolute: 0.1 10*3/uL (ref 0.0–0.1)
Basophils Relative: 1 %
Eosinophils Absolute: 0.1 10*3/uL (ref 0.0–0.5)
Eosinophils Relative: 1 %
HCT: 27.8 % — ABNORMAL LOW (ref 39.0–52.0)
Hemoglobin: 9.2 g/dL — ABNORMAL LOW (ref 13.0–17.0)
Immature Granulocytes: 1 %
Lymphocytes Relative: 8 %
Lymphs Abs: 0.9 10*3/uL (ref 0.7–4.0)
MCH: 30.8 pg (ref 26.0–34.0)
MCHC: 33.1 g/dL (ref 30.0–36.0)
MCV: 93 fL (ref 80.0–100.0)
Monocytes Absolute: 0.7 10*3/uL (ref 0.1–1.0)
Monocytes Relative: 7 %
Neutro Abs: 9.1 10*3/uL — ABNORMAL HIGH (ref 1.7–7.7)
Neutrophils Relative %: 82 %
Platelets: 179 10*3/uL (ref 150–400)
RBC: 2.99 MIL/uL — ABNORMAL LOW (ref 4.22–5.81)
RDW: 15.2 % (ref 11.5–15.5)
WBC: 10.9 10*3/uL — ABNORMAL HIGH (ref 4.0–10.5)
nRBC: 0 % (ref 0.0–0.2)

## 2019-04-09 LAB — PHOSPHORUS: Phosphorus: 2.9 mg/dL (ref 2.5–4.6)

## 2019-04-09 LAB — T4, FREE: Free T4: 0.86 ng/dL (ref 0.61–1.12)

## 2019-04-09 LAB — MAGNESIUM: Magnesium: 1.4 mg/dL — ABNORMAL LOW (ref 1.7–2.4)

## 2019-04-09 SURGERY — COLONOSCOPY WITH PROPOFOL
Anesthesia: Monitor Anesthesia Care

## 2019-04-09 MED ORDER — PROPOFOL 10 MG/ML IV BOLUS
INTRAVENOUS | Status: DC | PRN
  Administered 2019-04-09: 20 mg via INTRAVENOUS
  Administered 2019-04-09 (×3): 10 mg via INTRAVENOUS

## 2019-04-09 MED ORDER — MAGNESIUM SULFATE 2 GM/50ML IV SOLN
2.0000 g | Freq: Once | INTRAVENOUS | Status: AC
  Administered 2019-04-09: 2 g via INTRAVENOUS
  Filled 2019-04-09: qty 50

## 2019-04-09 MED ORDER — SODIUM CHLORIDE 0.9 % IV SOLN
INTRAVENOUS | Status: AC | PRN
  Administered 2019-04-09: 500 mL via INTRAMUSCULAR

## 2019-04-09 MED ORDER — PROPOFOL 500 MG/50ML IV EMUL
INTRAVENOUS | Status: DC | PRN
  Administered 2019-04-09: 100 ug/kg/min via INTRAVENOUS

## 2019-04-09 MED ORDER — HEPARIN SODIUM (PORCINE) 1000 UNIT/ML IJ SOLN
INTRAMUSCULAR | Status: AC
  Filled 2019-04-09: qty 4

## 2019-04-09 MED ORDER — LIDOCAINE 2% (20 MG/ML) 5 ML SYRINGE
INTRAMUSCULAR | Status: DC | PRN
  Administered 2019-04-09: 40 mg via INTRAVENOUS

## 2019-04-09 SURGICAL SUPPLY — 22 items

## 2019-04-09 NOTE — Evaluation (Signed)
Physical Therapy Evaluation Patient Details Name: Kyle [name] MRN: [id number] DOB: 07/20/1950 Today's Date: 04/09/2019   History of Present Illness  The patient is a 69 year old Caucasian Male who was Patient was discharged from [facility] on 1/21 to CIR at [facility] after being treated for acute left midbrain stroke. He subsequently developed GI bleed  (acute onset of black and red stools X 5 overnight and also vomited coffee grounds one time while at rehab) and acute blood loss anemia and was transferred from Rehab and admitted to [facility] on 2/6. He has ESRD and is being dialyzed during his hospitalization Tuesdays Thursdays and Saturdays.   Nephrology and GI following: And the plan was for colonoscopy 04/08/19 however patient refused prep and has been refusing labwork and intervention and became combative and rude to staff. Because of this we consulted Neurology, Psychiatry, and repeat an MRI of the brain and EEG.   Pt has begun prep 2/11, colonoscopy pending.  Clinical Impression  Pt admitted with/for GIB from CIR with decreased ability to participate.  Pt presently needing min to moderate assist as he fatigues..  Pt currently limited functionally due to the problems listed. ( See problems list.)   Pt will benefit from PT to maximize function and safety in order to get ready for next venue listed below.     Follow Up Recommendations CIR    Equipment Recommendations  Other (comment)(TBA)    Recommendations for Other Services       Precautions / Restrictions Precautions Precautions: Fall      Mobility  Bed Mobility Overal bed mobility: Needs Assistance Bed Mobility: Supine to Sit;Sit to Supine     Supine to sit: Min assist Sit to supine: Min assist   General bed mobility comments: stability assist up via R elbow and LE assist into bed.  Directional and cuing assist for bridging into middle of the bed.  Transfers Overall transfer level: Needs assistance Equipment used:  Rolling walker (2 wheeled) Transfers: Sit to/from Stand Sit to Stand: Min assist         General transfer comment: cues for safer hand placement and stability assist throughout the stand.  Ambulation/Gait Ambulation/Gait assistance: Min assist;Mod assist;+2 safety/equipment;+2 physical assistance Gait Distance (Feet): 120 Feet Assistive device: Rolling walker (2 wheeled) Gait Pattern/deviations: Step-through pattern Gait velocity: decreased Gait velocity interpretation: <1.8 ft/sec, indicate of risk for recurrent falls General Gait Details: pt showed a consistent list R using the RW, but as the distance progressed and then pt needed to emergently go to the bathroom, pt;s gait pattern significantly degraded with increased lean, decreased control of the RW and a second person required to help for safety and control.  Stairs            Wheelchair Mobility    Modified Rankin (Stroke Patients Only) Modified Rankin (Stroke Patients Only) Pre-Morbid Rankin Score: No symptoms Modified Rankin: Moderately severe disability     Balance Overall balance assessment: Needs assistance Sitting-balance support: Feet supported;Single extremity supported;No upper extremity supported Sitting balance-Leahy Scale: Fair     Standing balance support: Bilateral upper extremity supported;During functional activity Standing balance-Leahy Scale: Poor Standing balance comment: reliant on AD or external support                             Pertinent Vitals/Pain Pain Assessment: Faces Faces Pain Scale: Hurts a little bit Pain Location: raw buttock from prep Pain Descriptors / Indicators:  Burning Pain Intervention(s): Monitored during session    Home Living Family/patient expects to be discharged to:: Private residence Living Arrangements: Spouse/significant other Available Help at Discharge: Family;Available 24 hours/day Type of Home: House Home Access: Stairs to enter Entrance  Stairs-Rails: Right Entrance Stairs-Number of Steps: 6 Home Layout: Two level Home Equipment: Walker - 2 wheels;Cane - single point;Walker - standard      Prior Function Level of Independence: Needs assistance   Gait / Transfers Assistance Needed: Pt reports that he does not use walkers, states he typically uses SPC for fxl mobility for household distances.[name]  Has been using a RW on CIR  ADL's / Homemaking Assistance Needed: Pt states he was able to perfrom BADLs, states that he was driving. States that wife primarily performs cooking/cleaning.        Hand Dominance   Dominant Hand: Right    Extremity/Trunk Assessment   Upper Extremity Assessment Upper Extremity Assessment: Defer to OT evaluation    Lower Extremity Assessment Lower Extremity Assessment: RLE deficits/detail;LLE deficits/detail RLE Deficits / Details: grossly 4/5, decreased coordination, mild ataxia with gait. RLE Sensation: (pt report similar sensation L/R) RLE Coordination: decreased fine motor LLE Deficits / Details: functional       Communication   Communication: No difficulties  Cognition Arousal/Alertness: Awake/alert Behavior During Therapy: Impulsive Overall Cognitive Status: (NT formally.  Pt brushes off questions.)                           Safety/Judgement: Decreased awareness of safety            General Comments      Exercises     Assessment/Plan    PT Assessment Patient needs continued PT services  PT Problem List Decreased strength;Decreased activity tolerance;Decreased balance;Decreased mobility;Decreased coordination;Decreased knowledge of use of DME       PT Treatment Interventions Gait training;Functional mobility training;Therapeutic activities;Neuromuscular re-education;Balance training;Patient/family education;DME instruction    PT Goals (Current goals can be found in the Care Plan section)  Acute Rehab PT Goals Patient Stated Goal: still need to get better  before home PT Goal Formulation: With patient Time For Goal Achievement: 04/23/19 Potential to Achieve Goals: Good    Frequency Min 3X/week   Barriers to discharge        Co-evaluation               AM-PAC PT "6 Clicks" Mobility  Outcome Measure Help needed turning from your back to your side while in a flat bed without using bedrails?: A Little Help needed moving from lying on your back to sitting on the side of a flat bed without using bedrails?: A Little Help needed moving to and from a bed to a chair (including a wheelchair)?: A Little Help needed standing up from a chair using your arms (e.g., wheelchair or bedside chair)?: A Little Help needed to walk in hospital room?: A Lot Help needed climbing 3-5 steps with a railing? : A Lot 6 Click Score: 16    End of Session   Activity Tolerance: Patient tolerated treatment well;Patient limited by fatigue Patient left: in bed;with call bell/phone within reach;with bed alarm set Nurse Communication: Mobility status PT Visit Diagnosis: Unsteadiness on feet (R26.81);Other abnormalities of gait and mobility (R26.89);Hemiplegia and hemiparesis;Difficulty in walking, not elsewhere classified (R26.2) Hemiplegia - dominant/non-dominant: Dominant Hemiplegia - caused by: Cerebral infarction    Time: 7902-4097 PT Time Calculation (min) (ACUTE ONLY): 25 min   Charges:  PT Evaluation $PT Eval Moderate Complexity: 1 Mod PT Treatments $Gait Training: 8-22 mins        04/09/2019  Ginger Carne., PT Acute Rehabilitation Services [phone]  (pager) [phone]  (office)  Tessie Fass Kiersten Coss 04/09/2019, 1:27 PM

## 2019-04-09 NOTE — Addendum Note (Signed)
Addendum  created 04/09/19 1704 by Belinda Block, MD   Intraprocedure Staff edited

## 2019-04-09 NOTE — Op Note (Addendum)
[facility] Patient Name: Kyle [name] Procedure Date : 04/09/2019 MRN: [id number] Attending MD: Carol Ada , MD Date of Birth: 10/08/1950 CSN: [id number] Age: 69 Admit Type: Inpatient Procedure:                Colonoscopy Indications:              Heme positive stool, Melena Providers:                Carol Ada, MD, Jeanella Cara, RN, Janeece Agee, Technician, Reather Laurence, CRNA Referring MD:              Medicines:                Propofol per Anesthesia Complications:            No immediate complications. Estimated Blood Loss:     Estimated blood loss: none. Procedure:                Pre-Anesthesia Assessment:                           - Prior to the procedure, a History and Physical                            was performed, and patient medications and                            allergies were reviewed. The patient's tolerance of                            previous anesthesia was also reviewed. The risks                            and benefits of the procedure and the sedation                            options and risks were discussed with the patient.                            All questions were answered, and informed consent                            was obtained. Prior Anticoagulants: The patient has                            taken Plavix (clopidogrel), last dose was 2 days                            prior to procedure. ASA Grade Assessment: III - A                            patient with severe systemic disease. After  reviewing the risks and benefits, the patient was                            deemed in satisfactory condition to undergo the                            procedure.                           - Sedation was administered by an anesthesia                            professional. Deep sedation was attained.                           After obtaining informed consent, the colonoscope                             was passed under direct vision. Throughout the                            procedure, the patient's blood pressure, pulse, and                            oxygen saturations were monitored continuously. The                            CF-HQ190L ([id number]) Olympus colonoscope was                            introduced through the anus and advanced to the the                            cecum, identified by appendiceal orifice and                            ileocecal valve. The colonoscopy was performed                            without difficulty. The patient tolerated the                            procedure well. The quality of the bowel                            preparation was good. The ileocecal valve,                            appendiceal orifice, and rectum were photographed. Scope In: 2:58:07 PM Scope Out: 3:26:33 PM Scope Withdrawal Time: 0 hours 19 minutes 18 seconds  Total Procedure Duration: 0 hours 28 minutes 26 seconds  Findings:      A single small localized angiodysplastic lesion without bleeding was       found in the transverse colon. Coagulation for tissue destruction using  monopolar probe was successful.      Scattered small and large-mouthed diverticula were found in the entire       colon.      There was a medium-sized lipoma, in the transverse colon.      Scattered diverticula were noted throughout the colon, but the highest       concentration was in the left side of the colon. A small nonbleeding AVM       was ablated with APC. There was evidence of bleeding in the cecum, but       with closer examination, this was consistent with barotrauma. Impression:               - A single non-bleeding colonic angiodysplastic                            lesion. Treated with a monopolar probe.                           - Diverticulosis in the entire examined colon.                           - Medium-sized lipoma in the transverse colon.                            - No specimens collected. Recommendation:           - Return patient to hospital ward for ongoing care.                           - Resume regular diet.                           - Continue present medications.                           - Ok to restart Plavix. Procedure Code(s):        --- Professional ---                           [phone], Colonoscopy, flexible; with ablation of                            tumor(s), polyp(s), or other lesion(s) (includes                            pre- and post-dilation and guide wire passage, when                            performed) Diagnosis Code(s):        --- Professional ---                           K55.20, Angiodysplasia of colon without hemorrhage                           D17.5, Benign lipomatous neoplasm of  intra-abdominal organs                           R19.5, Other fecal abnormalities                           K92.1, Melena (includes Hematochezia)                           K57.30, Diverticulosis of large intestine without                            perforation or abscess without bleeding CPT copyright 2019 American Medical Association. All rights reserved. The codes documented in this report are preliminary and upon coder review may  be revised to meet current compliance requirements. Carol Ada, MD Carol Ada, MD 04/09/2019 3:35:02 PM This report has been signed electronically. Number of Addenda: 0

## 2019-04-09 NOTE — Progress Notes (Signed)
Patient is very pleasant this morning as well as cooperative.

## 2019-04-09 NOTE — Anesthesia Preprocedure Evaluation (Addendum)
Anesthesia Evaluation  Patient identified by MRN, date of birth, ID band Patient awake    Reviewed: Allergy & Precautions  Airway Mallampati: II  TM Distance: >3 FB     Dental   Pulmonary COPD, Current Smoker and Patient abstained from smoking.,    breath sounds clear to auscultation       Cardiovascular hypertension, + CAD and + Peripheral Vascular Disease   Rhythm:Regular Rate:Normal     Neuro/Psych Seizures -,  Anxiety Depression  Neuromuscular disease CVA    GI/Hepatic   Endo/Other  diabetesHypothyroidism   Renal/GU Renal disease     Musculoskeletal  (+) Arthritis ,   Abdominal   Peds  Hematology  (+) anemia ,   Anesthesia Other Findings   Reproductive/Obstetrics                            Anesthesia Physical Anesthesia Plan  ASA: III  Anesthesia Plan: MAC   Post-op Pain Management:    Induction: Intravenous  PONV Risk Score and Plan: Propofol infusion and Treatment may vary due to age or medical condition  Airway Management Planned: Nasal Cannula and Simple Face Mask  Additional Equipment:   Intra-op Plan:   Post-operative Plan:   Informed Consent: I have reviewed the patients History and Physical, chart, labs and discussed the procedure including the risks, benefits and alternatives for the proposed anesthesia with the patient or authorized representative who has indicated his/her understanding and acceptance.     Dental advisory given  Plan Discussed with: CRNA and Anesthesiologist  Anesthesia Plan Comments:         Anesthesia Quick Evaluation

## 2019-04-09 NOTE — Progress Notes (Signed)
[facility] Progress Note    Assessment/ Plan:    ESRD: Transitioned from PD to HD on 2/2, last session 2/9.  RUE AVF placed on 2/1.  Following T/Th/Sat schedule, HD today. Awaiting outpatient HD placement.    Anemia of CKD   Suspected Blood loss anemia: S/p 4 pRBCs. Planning for colonoscopy on 2/12. Receiving Aranesp weekly.   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   Hypertension: Stable. On metoprolol, cont to hold norvasc given likely blood loss.    Intermittent Agitation: Suspected delirium. EEG and MRI wnl. Plan per primary and neurology.    Nutrition: Albumin 1.9. Renavit MVI daily.    Acute [place] Stroke: Continued rehabilitation.   T2DM  Hypothyroidism: Per primary.    Subjective:   No acute events overnight. Due to increased agitation and change in mental status, neurology and psych were consulted yesterday. Doing well this am, pleasant.    Objective:   BP (!) 159/61 (BP Location: Right Arm)   Pulse 73   Temp 98.9 F (37.2 C) (Axillary)   Resp 11   Wt 96.3 kg   SpO2 100%   BMI 28.79 kg/m   Intake/Output Summary (Last 24 hours) at 04/09/2019 0809 Last data filed at 04/08/2019 1700 Gross per 24 hour  Intake 633.22 ml  Output 500 ml  Net 133.22 ml   Weight change:   Physical Exam: General: Alert, NAD, pleasant sitting up in bed having blood drawn  HEENT: NCAT  Cardiac: RRR  Lungs: Clear bilaterally, no increased WOB  Abdomen: soft, non-tender Msk: Moves all extremities spontaneously  Ext: Warm, dry, 2+ distal pulses, no edema Access: Right IJ tunnel cath with LUE AVF w/ palpable thrill/audible bruit    Imaging: CT HEAD WO CONTRAST  Result Date: 04/07/2019 CLINICAL DATA:  New onset neuro changes. Clinical suspicion for acute stroke. EXAM: CT HEAD WITHOUT CONTRAST TECHNIQUE: Contiguous axial images were obtained from the base of the skull through the vertex without intravenous contrast. COMPARISON:  03/26/2019. FINDINGS: Brain:  The low density the previously demonstrated at the site of recent infarction in the left paramedian midbrain is less prominent. 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 No CT evidence of acute infarction. No intracranial hemorrhage or mass lesion. Vascular: No hyperdense vessel or unexpected calcification. Skull: Normal. Negative for fracture or focal lesion. Sinuses/Orbits: Stable mild dysconjugate gaze. Stable chronic soft tissue and calcific density in the left maxillary sinus, suggesting fungal sinusitis. Other: None. IMPRESSION: 1. No acute abnormality. 2. Expected evolution of the previously demonstrated left paramedian midbrain infarction. 3. 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 4. 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 Electronically Signed   By: Claudie Revering M.D.   On: 04/07/2019 12:25   MR BRAIN WO CONTRAST  Result Date: 04/08/2019 CLINICAL DATA:  Encephalopathy. EXAM: MRI HEAD WITHOUT CONTRAST TECHNIQUE: Multiplanar, multiecho pulse sequences of the brain and surrounding structures were obtained without intravenous contrast. COMPARISON:  Head CT 04/07/2019 and MRI 03/15/2019 FINDINGS: Brain: There is only minimal residual diffusion signal abnormality in the left paramedian midbrain corresponding to the acute infarct on last month's MRI. No acute infarct, mass, midline shift, or extra-axial fluid collection is identified. Small chronic infarcts are again noted in the left paracentral pons, cerebellum, left thalamus, and white matter adjacent to the right frontal horn. T2 hyperintensities elsewhere in the cerebral white matter bilaterally are unchanged and nonspecific but compatible with moderate chronic small vessel ischemic disease. There is moderate cerebral atrophy. Several scattered chronic microhemorrhages are again noted  in the cerebrum and cerebellum. Vascular: Major intracranial vascular flow voids are preserved. Skull and upper cervical spine: Unremarkable bone marrow signal.  Sinuses/Orbits: Unremarkable orbits. Near complete opacification of the left maxillary sinus by complex material. No significant mastoid fluid. Other: None. IMPRESSION: 1. No acute intracranial abnormality. 2. Expected evolution of midbrain infarct since 03/15/2019. 3. Chronic small vessel ischemic disease with multiple old infarcts as above. Electronically Signed   By: Logan Bores M.D.   On: 04/08/2019 15:17   DG CHEST PORT 1 VIEW  Result Date: 04/08/2019 CLINICAL DATA:  Shortness of breath EXAM: PORTABLE CHEST 1 VIEW COMPARISON:  03/29/2018 FINDINGS: Cardiac shadow is mildly enlarged but stable. Dialysis catheter is again seen in satisfactory position. The lungs are clear bilaterally. Old rib fracture with healing on the left is seen. IMPRESSION: No acute abnormality noted. Electronically Signed   By: [name] M.D.   On: 04/08/2019 12:40   EEG adult  Result Date: 04/08/2019 Lora Havens, MD     04/08/2019  5:22 PM Patient Name: Kyle [name] MRN: [id number] Epilepsy Attending: Lora Havens Referring Physician/Provider: Dr Baird Kay Date: 04/08/2019 Duration: 22.44 mins Patient history: 69 yo M with multiple chronic infarcts in left midbrain, left pons, cerebellum, left thalamus and epilepsy who presented with ams. EEG to evaluate for seizure. Level of alertness: awake, asleep AEDs during EEG study: LEV Technical aspects: This EEG study was done with scalp electrodes positioned according to the 10-20 International system of electrode placement. Electrical activity was acquired at a sampling rate of 500Hz  and reviewed with a high frequency filter of 70Hz  and a low frequency filter of 1Hz . EEG data were recorded continuously and digitally stored. DESCRIPTION: The posterior dominant rhythm consists of 7.5Hz  activity of moderate voltage (25-35 uV) seen predominantly in posterior head regions, symmetric and reactive to eye opening and eye closing. Sleep was characterized by vertex waves.   Hyperventilation and photic stimulation were not performed. IMPRESSION: This study is within normal limits. No seizures or epileptiform discharges were seen throughout the recording. Branch: [facility] Recent Labs  Lab 04/03/19 [id number] 04/03/19 0755 04/04/19 0240 04/04/19 0241 04/05/19 0905 04/06/19 0833 04/07/19 1647 04/08/19 1612 04/09/19 0407  NA 139   < > 139 139 138 138 136 140 140  K 4.1   < > 4.7 4.8 4.1 4.3 4.2 4.0 4.2  CL 101   < > 104 103 99 106 107 101 104  CO2 28  --  24 24  --  22 20* 24 23  GLUCOSE 159*   < > 133* 135* 88 104* 176* 124* 112*  BUN 30*   < > 66* 68* 31* 46* 53* 21 22  CREATININE 5.32*   < > 6.50* 6.76* 4.20* 5.78* 7.08* 4.80* 5.50*  CALCIUM 7.8*  --  7.4* 7.3*  --  6.6* 6.3* 7.3* 7.4*  PHOS 2.3*  --  2.3*  --   --   --  3.0 2.8 2.9   < > = values in this interval not displayed.   CBC Recent Labs  Lab 04/04/19 1543 04/05/19 0237 04/06/19 0833 04/07/19 0645 04/08/19 1612 04/09/19 0407  WBC 12.8*  --  6.9 7.3 6.1 10.9*  NEUTROABS 10.4*  --  5.2  --  4.7 9.1*  HGB 6.1*   < > 9.0* 8.7* 8.8* 9.2*  HCT 18.2*   < > 27.2* 26.3* 26.0* 27.8*  MCV 94.3  --  93.2 93.3 92.9 93.0  PLT 183  --  124* 136* 150 179   < > = values in this interval not displayed.    Medications:    . sodium chloride   Intravenous Once  . atorvastatin  80 mg Oral Daily  . chlorhexidine  15 mL Mouth Rinse BID  . Chlorhexidine Gluconate Cloth  6 each Topical Daily  . [START ON 04/11/2019] darbepoetin (ARANESP) injection - DIALYSIS  100 mcg Intravenous Q Sat-HD  . gentamicin cream  1 application Topical Daily  . insulin aspart  0-15 Units Subcutaneous TID WC  . levothyroxine  200 mcg Oral q morning - 10a  . multivitamin  1 tablet Oral QHS  . naloxone  0.4 mg Intramuscular Once  . pantoprazole  40 mg Intravenous Q12H  . traZODone  50 mg Oral QHS      Darrelyn Hillock, DO Family Medicine PGY-2  04/09/2019, 8:09 AM

## 2019-04-09 NOTE — Progress Notes (Signed)
PROGRESS NOTE    Kyle [name]  YHC:[id number] DOB: Sep 29, 1950 DOA: 04/04/2019 PCP: Idelle Crouch, MD   Brief Narrative:  The patient is a 69 year old Caucasian Male who was Patient was discharged from [facility] on 1/21 to CIR at [facility] after being treated for acute left midbrain stroke. He subsequently developed GI bleed(acute onset of black and red stools X 5 overnight and also vomited coffee grounds one timewhile at rehab)and acute blood loss anemia and was transferred from Rehab and admitted to [facility] on 2/6. He has ESRD and is being dialyzed during his hospitalization Tuesdays Thursdays and Saturdays. Nephrology and GI following: And the plan was for colonoscopy 04/08/19 however patient refused prep and has been refusing labwork and intervention and became combative and rude to staff. Because of this we consulted Neurology, Psychiatry, and repeat an MRI of the brain and EEG.   Patient is now improved more cooperative than drinking his bowel prep and having bowel movements and planning for colonoscopy in a.m.  Assessment & Plan:   Principal Problem:   GI bleed Active Problems:   Chronic obstructive pulmonary disease (HCC)   Diabetes mellitus (HCC)   Insulin dependent type 2 diabetes mellitus (HCC)   ESRD (end stage renal disease) (HCC)   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HCC)   Acute upper GI bleed  GI bleed/Acute blood loss anemia superimposed on chronic anemia of kidney disease -S/p EGDno bleeding source find in EGDwith duodenitis and gastritis, patient has a colonoscopy in 11/2018 "adenomatous polyps and a tubulovillous adenoma were removed and extensive diverticulosis was noted" -He denies Abdominal Pain  -S/p Transfusion of 2 units of pRBC's, now hemoglobin/hematocrit has improved and is now 9.2/27.8 -Nursing stated he had more bloody/melanotic BM the day before yesterday/yesterday but none overnight  -GI recommend continue PPIdrip through 04/07/19 then transition to IV BID  PPI -GI was to do colonoscopy 04/08/19 but cancelled as patient did not drink prep yesterday but educated today and plans for colonoscopy in the a.m. -Patient is calmer today and pleasant and has had no behavioral issues  HTN: -Currently was 155/71 -Continue to Hold home bp meds includingnorvasc, cozaar -Change toprol-xl to low dose lopressor with holding parameters  CVA withdifficulty with balance and ptosis of left eyelid. -He was on asa/plavix for three wks then asa alone -Mental status improved yesterday but, checking CT head after further discussion with wife behavior yesterday was not normal -Repeat Head CT showed "No acute abnormality. Expected evolution of the previously demonstrated left paramedian midbrain infarction. 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." -Now he remains agitated and combative and refusing interventions -Repeated Brain MRI w/o Contrast and Consult Psych for behavioral issues and Capacity Evaluation and have also asked neurology to come back; patient does have the mental capacity make his own decisions and neurology feels that symptoms were related to delirium  -Hold ASA in the setting of gi bleed, continueStatin -Repeat MRI as below and EEG normal  Seizure Disorder  -On Keppra chronically -IV currently as he had refused meds; Continue Levitracetam 500 mg IV q12h for now and he is getting additional dose -Check EEG head was normal as below  Insulin DependentDM2 -He was discharged on insulin (lantus and ssi) from recent hospitalization -CBGs ranging from 95-114 -Continue with Moderate NovoLog/scale insulin before meals and at bedtime  AMS/Encephalopathy, improving likely was some hospital delirium -Patient is alert and oriented however wife states that this is a total change from  his baseline; Today he is more appropriate and pleasant and cooperative  -Check blood cultures x2 and Pending, urinalysis  and urine culture as well as chest x-ray  -Patient's PD fluid was negative for infection -Checked head MRI and showed "No acute intracranial abnormality. Expected evolution of midbrain infarct since 03/15/2019. 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 No seizures or epileptiform discharges were seen throughout the recording." -Neurology consulted for further evaluation; neurology recommends continuing Keppra and given additional dose after dialysis and recommend reinitiating either aspirin or Plavix when safe and reducing trazodone 50 mg p.o. nightly and suspects that the patient has had some delirium -Psychiatric consulted and patient does have mental capacity to make informed decisions and would benefit from being given in-depth information regards to his procedures and treatments -Appears more pleasant and oriented today  ESRD was on peritoneal dialysis transitioned to HD on 2/2 Metabolic Acidosis, mild  -Status post RIJ TDC and left BCAVF placed by Dr. Carlis Abbott 2/1. -Nephrology consulted for further evaluation; patient has a right upper extremity AV fistula that was placed on 03/30/2019 -Received HD 04/07/19 and again we will get it today -Discussed with Nephrology and they will obtain a PD sample to r/o Infection  -Patient's BUN/Cr went from 46/5.78 -> 53/7.08 -> 21/4.80 -> 22/5.50 -Currently still awaiting outpatient HD placement -Other nephrotoxic medications, contrast dyes, hypotension and renally adjust medication  -Continue monitor trend renal function repeat CMP in a.m.  COPD -Not home o2 dependent, -No wheezing on exam, no hypoxia  Chronic RBBB -EKG stable  Hypothyroidism -Continue Levothyroxine 200 mcg po Daily but may need to change to IV since he has been refusing -Checked TSH and was 8.316 and Free T4 was 0.86   Thrombocytopenia -In the setting of his bleeding  -Patient's  platelet count went from 124,000 -> 136,000 -> 150,000 -> 179,000 -Continue to monitor for signs and symptoms of bleeding -Repeat CBC in a.m.  Hypomagnesemia  -Patient's magnesium level this morning is 1.4 -Replete with IV mag sulfate 2 g -Continue monitor replete as necessary -Repeat Mag Level in the AM   DVT prophylaxis: SCDs given his recent GI bleeding Code Status: FULL CODE Family Communication: Discussed with wife [name]) via telephone extensively  Disposition Plan: Patient is from CIR and had a GI bleed; Requires inpatient hospitalization for further colonoscopy when he is not as combative and will need to ensure his hemoglobin/hematocrit is stable and source of bleeding is located prior to safely D/C'ing back to CIR vs. SNF and if he does go to SNF will need to CLIP to a Dialysis Center.   Consultants:   Gastroenterology  Nephrology  Neurology  Psychiatry   Procedures:  EGD on 04/05/2019 Findings:      The examined esophagus was normal.      The Z-line was regular and was found 42 cm from the incisors.      Localized moderate inflammation characterized by congestion (edema),       erosions and erythema was found in the prepyloric region of the stomach.      The cardia and gastric fundus were normal on retroflexion.      Patchy severe mucosal changes characterized by congestion, erythema,       erosion and nodularity were found in the duodenal bulb. Biopsies were       taken with a cold forceps for histology. Estimated blood loss was  minimal.      The second portion of the duodenum was normal. Impression:               - Normal esophagus.                           - Z-line regular, 42 cm from the incisors.                           - Gastritis.                           - Mucosal changes in the duodenum. Biopsied.                           - Normal second portion of the duodenum.   Antimicrobials:  Anti-infectives (From admission, onward)   None      Subjective: Seen and examined at bedside he was more pleasant and cooperative today and was drinking his bowel prep without issues.  Denying chest pain, lightheadedness or dizziness but states that he was having a lot of bowel movements.  No other concerns or complaints at this time and patient is to be dialyzed later today.  Objective: Vitals:   04/09/19 0502 04/09/19 0530 04/09/19 0735 04/09/19 1118  BP: (!) 188/66 (!) 144/85 (!) 159/61 (!) 155/71  Pulse:  90 73 82  Resp: 14 (!) 22 11 14   Temp: 98 F (36.7 C)  98.9 F (37.2 C) 97.7 F (36.5 C)  TempSrc:   Axillary Oral  SpO2: 100% 99% 100% 97%  Weight:        Intake/Output Summary (Last 24 hours) at 04/09/2019 1156 Last data filed at 04/09/2019 1000 Gross per 24 hour  Intake 742.86 ml  Output 500 ml  Net 242.86 ml   Filed Weights   04/07/19 1320 04/07/19 1728  Weight: 99 kg 96.3 kg   Examination: Physical Exam:  Constitutional: WN/WD overweight Caucasian male in NAD and appears calm  Eyes: Has Ptosis of the Left Eye and it is closed  ENMT: External Ears, Nose appear normal. Grossly normal hearing.  Neck: Appears normal, supple, no cervical masses, normal ROM, no appreciable thyromegaly; no JVD Respiratory: Diminished to auscultation bilaterally, no wheezing, rales, rhonchi or crackles. Normal respiratory effort and patient is not tachypenic. No accessory muscle use. No JVD Cardiovascular: RRR, no murmurs / rubs / gallops. S1 and S2 auscultated.  Abdomen: Soft, non-tender, distended due to body habitus. No masses palpated. No appreciable hepatosplenomegaly. Bowel sounds positive x4.  GU: Deferred. Musculoskeletal: No clubbing / cyanosis of digits/nails. No joint deformity upper and lower extremities. Skin: No rashes, lesions, ulcers on a limited skin evaluation. No induration; Warm and dry.  Neurologic: CN 2-12 grossly intact with no focal deficits. Romberg sign and cerebellar reflexes not assessed.  Psychiatric: Normal  judgment and insight. Alert and oriented x 3. Normal mood and appropriate affect.   Data Reviewed: I have personally reviewed following labs and imaging studies  CBC: Recent Labs  Lab 04/04/19 0241 04/04/19 0241 04/04/19 1543 04/05/19 0237 04/05/19 2035 04/06/19 0833 04/07/19 0645 04/08/19 1612 04/09/19 0407  WBC 11.3*   < > 12.8*  --   --  6.9 7.3 6.1 10.9*  NEUTROABS 8.1*  --  10.4*  --   --  5.2  --  4.7 9.1*  HGB 5.1*   < > 6.1*   < > 9.4* 9.0* 8.7* 8.8* 9.2*  HCT 16.2*   < > 18.2*   < > 27.2* 27.2* 26.3* 26.0* 27.8*  MCV 105.9*   < > 94.3  --   --  93.2 93.3 92.9 93.0  PLT 226   < > 183  --   --  124* 136* 150 179   < > = values in this interval not displayed.   Basic Metabolic Panel: Recent Labs  Lab 04/03/19 0755 04/03/19 0755 04/04/19 0240 04/04/19 0240 04/04/19 0241 04/04/19 0241 04/05/19 0905 04/06/19 0833 04/07/19 1647 04/08/19 1612 04/09/19 0407  NA 139   < > 139   < > 139   < > 138 138 136 140 140  K 4.1   < > 4.7   < > 4.8   < > 4.1 4.3 4.2 4.0 4.2  CL 101   < > 104   < > 103   < > 99 106 107 101 104  CO2 28   < > 24   < > 24  --   --  22 20* 24 23  GLUCOSE 159*   < > 133*   < > 135*   < > 88 104* 176* 124* 112*  BUN 30*   < > 66*   < > 68*   < > 31* 46* 53* 21 22  CREATININE 5.32*   < > 6.50*   < > 6.76*   < > 4.20* 5.78* 7.08* 4.80* 5.50*  CALCIUM 7.8*   < > 7.4*   < > 7.3*  --   --  6.6* 6.3* 7.3* 7.4*  MG  --   --   --   --   --   --   --   --   --  1.3* 1.4*  PHOS 2.3*  --  2.3*  --   --   --   --   --  3.0 2.8 2.9   < > = values in this interval not displayed.   GFR: Estimated Creatinine Clearance: 15 mL/min (A) (by C-G formula based on SCr of 5.5 mg/dL (H)). Liver Function Tests: Recent Labs  Lab 04/04/19 0240 04/04/19 0241 04/07/19 1647 04/08/19 1612 04/09/19 0407  AST  --  21  --  22 21  ALT  --  11  --  13 15  ALKPHOS  --  50  --  66 68  BILITOT  --  0.4  --  0.9 0.9  PROT  --  4.3*  --  4.5* 4.7*  ALBUMIN 1.9* 1.9* 1.9* 2.0*  2.2*   No results for input(s): LIPASE, AMYLASE in the last 168 hours. No results for input(s): AMMONIA in the last 168 hours. Coagulation Profile: No results for input(s): INR, PROTIME in the last 168 hours. Cardiac Enzymes: No results for input(s): CKTOTAL, CKMB, CKMBINDEX, TROPONINI in the last 168 hours. BNP (last 3 results) No results for input(s): PROBNP in the last 8760 hours. HbA1C: No results for input(s): HGBA1C in the last 72 hours. CBG: Recent Labs  Lab 04/08/19 1148 04/08/19 1607 04/08/19 2200 04/09/19 0755 04/09/19 1058  GLUCAP 89 114* 112* 99 95   Lipid Profile: No results for input(s): CHOL, HDL, LDLCALC, TRIG, CHOLHDL, LDLDIRECT in the last 72 hours. Thyroid Function Tests: Recent Labs    04/08/19 1612 04/09/19 0838  TSH 8.316*  --   FREET4  --  0.86   Anemia Panel:  No results for input(s): VITAMINB12, FOLATE, FERRITIN, TIBC, IRON, RETICCTPCT in the last 72 hours. Sepsis Labs: No results for input(s): PROCALCITON, LATICACIDVEN in the last 168 hours.  Recent Results (from the past 240 hour(s))  Surgical pcr screen     Status: None   Collection Time: 04/04/19  8:22 PM   Specimen: Nasal Mucosa; Nasal Swab  Result Value Ref Range Status   MRSA, PCR NEGATIVE NEGATIVE Final   Staphylococcus aureus NEGATIVE NEGATIVE Final    Comment: (NOTE) The Xpert SA Assay (FDA approved for NASAL specimens in patients 66 years of age and older), is one component of a comprehensive surveillance program. It is not intended to diagnose infection nor to guide or monitor treatment. Performed at [facility], [place] [address], [place], [place] [address]   Culture, blood (routine x 2)     Status: None (Preliminary result)   Collection Time: 04/08/19  4:12 PM   Specimen: BLOOD  Result Value Ref Range Status   Specimen Description BLOOD RIGHT ANTECUBITAL  Final   Special Requests   Final    BOTTLES DRAWN AEROBIC AND ANAEROBIC Blood Culture adequate volume   Culture  PENDING  Incomplete   Report Status PENDING  Incomplete     RN Pressure Injury Documentation:     Estimated body mass index is 28.79 kg/m as calculated from the following:   Height as of 03/20/19: 6' (1.829 m).   Weight as of this encounter: 96.3 kg.  Malnutrition Type:      Malnutrition Characteristics:      Nutrition Interventions:     Radiology Studies: CT HEAD WO CONTRAST  Result Date: 04/07/2019 CLINICAL DATA:  New onset neuro changes. Clinical suspicion for acute stroke. EXAM: CT HEAD WITHOUT CONTRAST TECHNIQUE: Contiguous axial images were obtained from the base of the skull through the vertex without intravenous contrast. COMPARISON:  03/26/2019. FINDINGS: Brain: The low density the previously demonstrated at the site of recent infarction in the left paramedian midbrain is less prominent. 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 No CT evidence of acute infarction. No intracranial hemorrhage or mass lesion. Vascular: No hyperdense vessel or unexpected calcification. Skull: Normal. Negative for fracture or focal lesion. Sinuses/Orbits: Stable mild dysconjugate gaze. Stable chronic soft tissue and calcific density in the left maxillary sinus, suggesting fungal sinusitis. Other: None. IMPRESSION: 1. No acute abnormality. 2. Expected evolution of the previously demonstrated left paramedian midbrain infarction. 3. 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 4. 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 Electronically Signed   By: Claudie Revering M.D.   On: 04/07/2019 12:25   MR BRAIN WO CONTRAST  Result Date: 04/08/2019 CLINICAL DATA:  Encephalopathy. EXAM: MRI HEAD WITHOUT CONTRAST TECHNIQUE: Multiplanar, multiecho pulse sequences of the brain and surrounding structures were obtained without intravenous contrast. COMPARISON:  Head CT 04/07/2019 and MRI 03/15/2019 FINDINGS: Brain: There is only minimal residual diffusion signal abnormality in the left paramedian midbrain corresponding to  the acute infarct on last month's MRI. No acute infarct, mass, midline shift, or extra-axial fluid collection is identified. Small chronic infarcts are again noted in the left paracentral pons, cerebellum, left thalamus, and white matter adjacent to the right frontal horn. T2 hyperintensities elsewhere in the cerebral white matter bilaterally are unchanged and nonspecific but compatible with moderate chronic small vessel ischemic disease. There is moderate cerebral atrophy. Several scattered chronic microhemorrhages are again noted in the cerebrum and cerebellum. Vascular: Major intracranial vascular flow voids are preserved. Skull and upper cervical spine: Unremarkable bone  marrow signal. Sinuses/Orbits: Unremarkable orbits. Near complete opacification of the left maxillary sinus by complex material. No significant mastoid fluid. Other: None. IMPRESSION: 1. No acute intracranial abnormality. 2. Expected evolution of midbrain infarct since 03/15/2019. 3. Chronic small vessel ischemic disease with multiple old infarcts as above. Electronically Signed   By: Logan Bores M.D.   On: 04/08/2019 15:17   DG CHEST PORT 1 VIEW  Result Date: 04/08/2019 CLINICAL DATA:  Shortness of breath EXAM: PORTABLE CHEST 1 VIEW COMPARISON:  03/29/2018 FINDINGS: Cardiac shadow is mildly enlarged but stable. Dialysis catheter is again seen in satisfactory position. The lungs are clear bilaterally. Old rib fracture with healing on the left is seen. IMPRESSION: No acute abnormality noted. Electronically Signed   By: [name] M.D.   On: 04/08/2019 12:40   EEG adult  Result Date: 04/08/2019 Lora Havens, MD     04/08/2019  5:22 PM Patient Name: Korver Graybeal MRN: [id number] Epilepsy Attending: Lora Havens Referring Physician/Provider: Dr Baird Kay Date: 04/08/2019 Duration: 22.44 mins Patient history: 69 yo M with multiple chronic infarcts in left midbrain, left pons, cerebellum, left thalamus and epilepsy who  presented with ams. EEG to evaluate for seizure. Level of alertness: awake, asleep AEDs during EEG study: LEV Technical aspects: This EEG study was done with scalp electrodes positioned according to the 10-20 International system of electrode placement. Electrical activity was acquired at a sampling rate of 500Hz  and reviewed with a high frequency filter of 70Hz  and a low frequency filter of 1Hz . EEG data were recorded continuously and digitally stored. DESCRIPTION: The posterior dominant rhythm consists of 7.5Hz  activity of moderate voltage (25-35 uV) seen predominantly in posterior head regions, symmetric and reactive to eye opening and eye closing. Sleep was characterized by vertex waves.  Hyperventilation and photic stimulation were not performed. IMPRESSION: This study is within normal limits. No seizures or epileptiform discharges were seen throughout the recording. Priyanka Barbra Sarks   Scheduled Meds: . sodium chloride   Intravenous Once  . atorvastatin  80 mg Oral Daily  . chlorhexidine  15 mL Mouth Rinse BID  . Chlorhexidine Gluconate Cloth  6 each Topical Daily  . [START ON 04/11/2019] darbepoetin (ARANESP) injection - DIALYSIS  100 mcg Intravenous Q Sat-HD  . gentamicin cream  1 application Topical Daily  . insulin aspart  0-15 Units Subcutaneous TID WC  . levothyroxine  200 mcg Oral q morning - 10a  . multivitamin  1 tablet Oral QHS  . naloxone  0.4 mg Intramuscular Once  . pantoprazole  40 mg Intravenous Q12H  . traZODone  50 mg Oral QHS   Continuous Infusions: . levETIRAcetam Stopped (04/09/19 0547)  . levETIRAcetam      LOS: 5 days   Kerney Elbe, DO [facility] PAGER is on [place]  If 7PM-7AM, please contact night-coverage [email or web address]

## 2019-04-09 NOTE — Transfer of Care (Signed)
Immediate Anesthesia Transfer of Care Note  Patient: Kyle [name]  Procedure(s) Performed: COLONOSCOPY WITH PROPOFOL (N/A ) HOT HEMOSTASIS (ARGON PLASMA COAGULATION/BICAP) (N/A )  Patient Location: PACU  Anesthesia Type:MAC  Level of Consciousness: drowsy  Airway & Oxygen Therapy: Patient Spontanous Breathing and Patient connected to nasal cannula oxygen  Post-op Assessment: Report given to RN and Post -op Vital signs reviewed and stable  Post vital signs: Reviewed  Last Vitals:  Vitals Value Taken Time  BP 124/42 04/09/19 1534  Temp    Pulse 76 04/09/19 1535  Resp 17 04/09/19 1535  SpO2 100 % 04/09/19 1535  Vitals shown include unvalidated device data.  Last Pain:  Vitals:   04/09/19 1359  TempSrc: Oral  PainSc: 0-No pain         Complications: No apparent anesthesia complications

## 2019-04-09 NOTE — Anesthesia Postprocedure Evaluation (Signed)
Anesthesia Post Note  Patient: Kyle [name]  Procedure(s) Performed: COLONOSCOPY WITH PROPOFOL (N/A ) HOT HEMOSTASIS (ARGON PLASMA COAGULATION/BICAP) (N/A )     Patient location during evaluation: Endoscopy Anesthesia Type: MAC Level of consciousness: awake Pain management: pain level controlled Vital Signs Assessment: post-procedure vital signs reviewed and stable Respiratory status: spontaneous breathing Cardiovascular status: stable Postop Assessment: no apparent nausea or vomiting Anesthetic complications: no    Last Vitals:  Vitals:   04/09/19 1543 04/09/19 1553  BP: (!) 157/116 (!) 160/48  Pulse: 79 77  Resp: (!) 21 15  Temp:    SpO2: 100% 100%    Last Pain:  Vitals:   04/09/19 1600  TempSrc:   PainSc: 0-No pain                 Doyal Saric

## 2019-04-09 NOTE — Progress Notes (Signed)
Initial Nutrition Assessment  DOCUMENTATION CODES:   Not applicable  INTERVENTION:  Diet advancement post procedure as appropriate.   RD to continue to monitor.   NUTRITION DIAGNOSIS:   Inadequate oral intake related to inability to eat as evidenced by NPO status.  GOAL:   Patient will meet greater than or equal to 90% of their needs  MONITOR:   Diet advancement, Skin, Weight trends, Labs, I & O's  REASON FOR ASSESSMENT:   NPO/Clear Liquid Diet    ASSESSMENT:   69 year old Caucasian Male who was Patient was discharged from [facility] on 1/21 to CIR at [facility] after being treated for acute left midbrain stroke. He subsequently developed GI bleed  (acute onset of black and red stools X 5 overnight and also vomited coffee grounds one time while at rehab) and acute blood loss anemia and was transferred from Rehab and admitted to [facility] on 2/6. He has ESRD and is being dialyzed during his hospitalization Tuesdays Thursdays and Saturdays. Nephrology and GI following with plans for colonoscopy.  Pt unavailable during attempted time of visit. Plans for colonoscopy. RD to order nutritional supplements as diet advances post procedure as appropriate to aid in caloric and protein needs. Noted pt NPO/clear liquids over the past 5 days. Unable to complete Nutrition-Focused physical exam at this time.   Labs and medications reviewed.   Diet Order:   Diet Order            Diet NPO time specified  Diet effective now              EDUCATION NEEDS:   Not appropriate for education at this time  Skin:  Skin Assessment: Reviewed RN Assessment  Last BM:  2/11  Height:   Ht Readings from Last 1 Encounters:  03/20/19 6' (1.829 m)    Weight:   Wt Readings from Last 1 Encounters:  04/09/19 96.3 kg    Ideal Body Weight:  80.9 kg  BMI:  Body mass index is 28.79 kg/m.  Estimated Nutritional Needs:   Kcal:  2050-2300  Protein:  105-115 grams  Fluid:  1 L +  UOP    Corrin Parker, MS, RD, LDN RD pager number/after hours weekend pager number on Amion.

## 2019-04-09 NOTE — Anesthesia Procedure Notes (Signed)
Procedure Name: MAC Date/Time: 04/09/2019 2:49 PM Performed by: Janene Harvey, CRNA Pre-anesthesia Checklist: Patient identified, Emergency Drugs available, Suction available and Patient being monitored Oxygen Delivery Method: Nasal cannula Dental Injury: Teeth and Oropharynx as per pre-operative assessment

## 2019-04-09 NOTE — Progress Notes (Signed)
Rehab Admissions Coordinator Note:  Per PT recommendation, this patient was screened by Raechel Ache for appropriateness for an Inpatient Acute Rehab Consult.  This patient is familiar to [place] as he was admitted to CIR from 1/22 to 2/6. Per CIR SW team, pt was slated to go SNF before he was discharged to acute on 2/6.   At this time, we are recommending [place] due to lack of caregiver support at DC.   Raechel Ache 04/09/2019, 1:54 PM  I can be reached at [phone].

## 2019-04-10 LAB — CBC WITH DIFFERENTIAL/PLATELET
Abs Immature Granulocytes: 0.04 10*3/uL (ref 0.00–0.07)
Basophils Absolute: 0.1 10*3/uL (ref 0.0–0.1)
Basophils Relative: 1 %
Eosinophils Absolute: 0.1 10*3/uL (ref 0.0–0.5)
Eosinophils Relative: 2 %
HCT: 26.6 % — ABNORMAL LOW (ref 39.0–52.0)
Hemoglobin: 8.8 g/dL — ABNORMAL LOW (ref 13.0–17.0)
Immature Granulocytes: 1 %
Lymphocytes Relative: 13 %
Lymphs Abs: 0.9 10*3/uL (ref 0.7–4.0)
MCH: 30.6 pg (ref 26.0–34.0)
MCHC: 33.1 g/dL (ref 30.0–36.0)
MCV: 92.4 fL (ref 80.0–100.0)
Monocytes Absolute: 0.4 10*3/uL (ref 0.1–1.0)
Monocytes Relative: 7 %
Neutro Abs: 5.2 10*3/uL (ref 1.7–7.7)
Neutrophils Relative %: 76 %
Platelets: 183 10*3/uL (ref 150–400)
RBC: 2.88 MIL/uL — ABNORMAL LOW (ref 4.22–5.81)
RDW: 14.7 % (ref 11.5–15.5)
WBC: 6.8 10*3/uL (ref 4.0–10.5)
nRBC: 0 % (ref 0.0–0.2)

## 2019-04-10 LAB — MAGNESIUM: Magnesium: 1.4 mg/dL — ABNORMAL LOW (ref 1.7–2.4)

## 2019-04-10 LAB — COMPREHENSIVE METABOLIC PANEL
ALT: 13 U/L (ref 0–44)
AST: 19 U/L (ref 15–41)
Albumin: 2.1 g/dL — ABNORMAL LOW (ref 3.5–5.0)
Alkaline Phosphatase: 68 U/L (ref 38–126)
Anion gap: 9 (ref 5–15)
BUN: 8 mg/dL (ref 8–23)
CO2: 26 mmol/L (ref 22–32)
Calcium: 7.3 mg/dL — ABNORMAL LOW (ref 8.9–10.3)
Chloride: 102 mmol/L (ref 98–111)
Creatinine, Ser: 3.32 mg/dL — ABNORMAL HIGH (ref 0.61–1.24)
GFR calc Af Amer: 21 mL/min — ABNORMAL LOW (ref >60)
GFR calc non Af Amer: 18 mL/min — ABNORMAL LOW (ref >60)
Glucose, Bld: 179 mg/dL — ABNORMAL HIGH (ref 70–99)
Potassium: 3.4 mmol/L — ABNORMAL LOW (ref 3.5–5.1)
Sodium: 137 mmol/L (ref 135–145)
Total Bilirubin: 0.6 mg/dL (ref 0.3–1.2)
Total Protein: 4.6 g/dL — ABNORMAL LOW (ref 6.5–8.1)

## 2019-04-10 LAB — GLUCOSE, CAPILLARY
Glucose-Capillary: 102 mg/dL — ABNORMAL HIGH (ref 70–99)
Glucose-Capillary: 172 mg/dL — ABNORMAL HIGH (ref 70–99)
Glucose-Capillary: 240 mg/dL — ABNORMAL HIGH (ref 70–99)
Glucose-Capillary: 160 mg/dL — ABNORMAL HIGH (ref 70–99)

## 2019-04-10 LAB — PATHOLOGIST SMEAR REVIEW

## 2019-04-10 LAB — T3: T3, Total: 58 ng/dL — ABNORMAL LOW (ref 71–180)

## 2019-04-10 LAB — PHOSPHORUS: Phosphorus: 2.1 mg/dL — ABNORMAL LOW (ref 2.5–4.6)

## 2019-04-10 MED ORDER — LEVETIRACETAM 500 MG PO TABS
500.0000 mg | ORAL_TABLET | ORAL | Status: DC
  Administered 2019-04-14: 500 mg via ORAL
  Filled 2019-04-10 (×2): qty 1

## 2019-04-10 MED ORDER — POTASSIUM CHLORIDE CRYS ER 20 MEQ PO TBCR
40.0000 meq | EXTENDED_RELEASE_TABLET | Freq: Once | ORAL | Status: AC
  Administered 2019-04-10: 40 meq via ORAL
  Filled 2019-04-10: qty 2

## 2019-04-10 MED ORDER — PANTOPRAZOLE SODIUM 40 MG PO TBEC
40.0000 mg | DELAYED_RELEASE_TABLET | Freq: Two times a day (BID) | ORAL | Status: DC
  Administered 2019-04-10 – 2019-04-16 (×12): 40 mg via ORAL
  Filled 2019-04-10 (×12): qty 1

## 2019-04-10 MED ORDER — CLOPIDOGREL BISULFATE 75 MG PO TABS
75.0000 mg | ORAL_TABLET | Freq: Every day | ORAL | Status: DC
  Administered 2019-04-10 – 2019-04-16 (×7): 75 mg via ORAL
  Filled 2019-04-10 (×7): qty 1

## 2019-04-10 MED ORDER — LEVETIRACETAM 500 MG PO TABS
500.0000 mg | ORAL_TABLET | Freq: Two times a day (BID) | ORAL | Status: DC
  Administered 2019-04-10 – 2019-04-16 (×11): 500 mg via ORAL
  Filled 2019-04-10 (×11): qty 1

## 2019-04-10 MED ORDER — K PHOS MONO-SOD PHOS DI & MONO 155-852-130 MG PO TABS
500.0000 mg | ORAL_TABLET | Freq: Once | ORAL | Status: AC
  Administered 2019-04-10: 500 mg via ORAL
  Filled 2019-04-10: qty 2

## 2019-04-10 MED ORDER — MAGNESIUM SULFATE 2 GM/50ML IV SOLN
2.0000 g | Freq: Once | INTRAVENOUS | Status: AC
  Administered 2019-04-10: 2 g via INTRAVENOUS
  Filled 2019-04-10: qty 50

## 2019-04-10 MED ORDER — PRO-STAT SUGAR FREE PO LIQD
30.0000 mL | Freq: Two times a day (BID) | ORAL | Status: DC
  Administered 2019-04-10 – 2019-04-16 (×11): 30 mL via ORAL
  Filled 2019-04-10 (×11): qty 30

## 2019-04-10 NOTE — Progress Notes (Signed)
[name] Progress Note    Assessment/ Plan:    ESRD: Transitioned from PD to HD on 2/2, last session 2/11.  RUE AVF placed on 2/1.  Following T/Th/Sat schedule. Awaiting outpatient HD placement.    Anemia of CKD   Suspected Blood loss anemia: S/p 4 pRBCs.  Colonoscopy on 2/11 showing nonbleeding angiodysplastic lesion which was coagulated and diverticula.  Receiving Aranesp weekly.   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   Hypertension: SBP 150-170s. On metoprolol.  Will restart Norvasc.   Intermittent Agitation: Improving, suspected delirium. EEG and MRI wnl. Plan per primary and neurology.    Nutrition: Albumin 1.9. Renavit MVI daily.    Acute [place] Stroke: Continued rehabilitation.  May restart Plavix per GI.   T2DM  Hypothyroidism: Per primary.    Subjective:   No acute events overnight, had colonoscopy yesterday. Doing well this morning, working well with PT. Hopeful to continue therapy and work up his strength.    Objective:   BP (!) 157/71 (BP Location: Right Arm)   Pulse 77   Temp 98.9 F (37.2 C) (Axillary)   Resp 15   Wt 95 kg   SpO2 100%   BMI 28.40 kg/m   Intake/Output Summary (Last 24 hours) at 04/10/2019 0810 Last data filed at 04/10/2019 0200 Gross per 24 hour  Intake 409.64 ml  Output 1660 ml  Net -1250.36 ml   Weight change:   Physical Exam: General: Alert, NAD, sitting up in the bedside chair, pleasant  HEENT: NCAT Cardiac: RRR  Lungs: Clear bilaterally, no increased WOB  Abdomen: soft, non-tender Ext: Warm, dry, 2+ distal pulses, no edema  Access: Right IJ tunnel catheter with LUE aVF with palpable thrill and audible bruit   Imaging: MR BRAIN WO CONTRAST  Result Date: 04/08/2019 CLINICAL DATA:  Encephalopathy. EXAM: MRI HEAD WITHOUT CONTRAST TECHNIQUE: Multiplanar, multiecho pulse sequences of the brain and surrounding structures were obtained without intravenous contrast. COMPARISON:  Head CT 04/07/2019 and  MRI 03/15/2019 FINDINGS: Brain: There is only minimal residual diffusion signal abnormality in the left paramedian midbrain corresponding to the acute infarct on last month's MRI. No acute infarct, mass, midline shift, or extra-axial fluid collection is identified. Small chronic infarcts are again noted in the left paracentral pons, cerebellum, left thalamus, and white matter adjacent to the right frontal horn. T2 hyperintensities elsewhere in the cerebral white matter bilaterally are unchanged and nonspecific but compatible with moderate chronic small vessel ischemic disease. There is moderate cerebral atrophy. Several scattered chronic microhemorrhages are again noted in the cerebrum and cerebellum. Vascular: Major intracranial vascular flow voids are preserved. Skull and upper cervical spine: Unremarkable bone marrow signal. Sinuses/Orbits: Unremarkable orbits. Near complete opacification of the left maxillary sinus by complex material. No significant mastoid fluid. Other: None. IMPRESSION: 1. No acute intracranial abnormality. 2. Expected evolution of midbrain infarct since 03/15/2019. 3. Chronic small vessel ischemic disease with multiple old infarcts as above. Electronically Signed   By: Logan Bores M.D.   On: 04/08/2019 15:17   DG CHEST PORT 1 VIEW  Result Date: 04/08/2019 CLINICAL DATA:  Shortness of breath EXAM: PORTABLE CHEST 1 VIEW COMPARISON:  03/29/2018 FINDINGS: Cardiac shadow is mildly enlarged but stable. Dialysis catheter is again seen in satisfactory position. The lungs are clear bilaterally. Old rib fracture with healing on the left is seen. IMPRESSION: No acute abnormality noted. Electronically Signed   By: [name] M.D.   On: 04/08/2019 12:40   EEG adult  Result Date:  04/08/2019 Lora Havens, MD     04/08/2019  5:22 PM Patient Name: Kyle [name] MRN: [id number] Epilepsy Attending: Lora Havens Referring Physician/Provider: Dr Baird Kay Date: 04/08/2019 Duration: 22.44  mins Patient history: 69 yo M with multiple chronic infarcts in left midbrain, left pons, cerebellum, left thalamus and epilepsy who presented with ams. EEG to evaluate for seizure. Level of alertness: awake, asleep AEDs during EEG study: LEV Technical aspects: This EEG study was done with scalp electrodes positioned according to the 10-20 International system of electrode placement. Electrical activity was acquired at a sampling rate of 500Hz  and reviewed with a high frequency filter of 70Hz  and a low frequency filter of 1Hz . EEG data were recorded continuously and digitally stored. DESCRIPTION: The posterior dominant rhythm consists of 7.5Hz  activity of moderate voltage (25-35 uV) seen predominantly in posterior head regions, symmetric and reactive to eye opening and eye closing. Sleep was characterized by vertex waves.  Hyperventilation and photic stimulation were not performed. IMPRESSION: This study is within normal limits. No seizures or epileptiform discharges were seen throughout the recording. Luke: BMET Recent Labs  Lab 04/04/19 0240 04/04/19 0240 04/04/19 0241 04/05/19 0905 04/06/19 0833 04/07/19 1647 04/08/19 1612 04/09/19 0407 04/10/19 0229  NA 139   < > 139 138 138 136 140 140 137  K 4.7   < > 4.8 4.1 4.3 4.2 4.0 4.2 3.4*  CL 104   < > 103 99 106 107 101 104 102  CO2 24  --  24  --  22 20* 24 23 26   GLUCOSE 133*   < > 135* 88 104* 176* 124* 112* 179*  BUN 66*   < > 68* 31* 46* 53* 21 22 8   CREATININE 6.50*   < > 6.76* 4.20* 5.78* 7.08* 4.80* 5.50* 3.32*  CALCIUM 7.4*  --  7.3*  --  6.6* 6.3* 7.3* 7.4* 7.3*  PHOS 2.3*  --   --   --   --  3.0 2.8 2.9 2.1*   < > = values in this interval not displayed.   CBC Recent Labs  Lab 04/06/19 0833 04/06/19 0833 04/07/19 0645 04/08/19 1612 04/09/19 0407 04/10/19 0229  WBC 6.9   < > 7.3 6.1 10.9* 6.8  NEUTROABS 5.2  --   --  4.7 9.1* 5.2  HGB 9.0*   < > 8.7* 8.8* 9.2* 8.8*  HCT 27.2*   < > 26.3* 26.0* 27.8*  26.6*  MCV 93.2   < > 93.3 92.9 93.0 92.4  PLT 124*   < > 136* 150 179 183   < > = values in this interval not displayed.    Medications:    . sodium chloride   Intravenous Once  . atorvastatin  80 mg Oral Daily  . chlorhexidine  15 mL Mouth Rinse BID  . Chlorhexidine Gluconate Cloth  6 each Topical Daily  . clopidogrel  75 mg Oral Daily  . [START ON 04/11/2019] darbepoetin (ARANESP) injection - DIALYSIS  100 mcg Intravenous Q Sat-HD  . gentamicin cream  1 application Topical Daily  . insulin aspart  0-15 Units Subcutaneous TID WC  . levothyroxine  200 mcg Oral q morning - 10a  . multivitamin  1 tablet Oral QHS  . naloxone  0.4 mg Intramuscular Once  . pantoprazole  40 mg Intravenous Q12H  . phosphorus  500 mg Oral Once  . potassium chloride  40 mEq Oral Once  . traZODone  50 mg Oral QHS      Darrelyn Hillock, DO Family Medicine PGY-2  04/10/2019, 8:10 AM

## 2019-04-10 NOTE — NC FL2 (Signed)
[place] LEVEL OF CARE SCREENING TOOL     IDENTIFICATION  Patient Name: Kyle [name] Birthdate: [birth year] Sex: male Admission Date (Current Location): 04/04/2019  [facility] and [place] Number:  Herbalist and Address:  [facility], [place] [address], [place], [place] [address]      Provider Number: [id number]  Attending Physician Name and Address:  Kerney Elbe, DO  Relative Name and Phone Number:  Catherine Oak  [phone]    Current Level of Care: Hospital Recommended Level of Care: [place] Prior Approval Number:    Date Approved/Denied:   PASRR Number: [id number]  Discharge Plan: SNF    Current Diagnoses: Patient Active Problem List   Diagnosis Date Noted  . GI bleed 04/04/2019  . Acute upper GI bleed 04/04/2019  . 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[place]) 03/20/2019  . Generalized weakness   . Diabetic polyneuropathy associated with type 2 diabetes mellitus ([place])   . Cerebrovascular accident (CVA) ([place])   . PVD (peripheral vascular disease) ([place]) 03/17/2019  . Ptosis of eyelid, left 03/15/2019  . Physical deconditioning 03/14/2019  . Hypomagnesemia 03/13/2019  . Hyperphosphatemia 03/13/2019  . ESRD (end stage renal disease) ([place]) 03/13/2019  . Chronic kidney disease with peritoneal dialysis as preferred modality, stage 5 ([place]) 03/13/2019  . Prolonged QT interval 03/13/2019  . Essential hypertension 03/13/2019  . Hypothyroidism 03/13/2019  . Chronic diarrhea 03/13/2019  . Hypocalcemia 03/12/2019  . Hypokalemia 09/16/2018  . Lung nodule 06/12/2016  . Narrowing of airway   . Cigarette smoker 05/24/2016  . Collapse of right lung 05/24/2016  . 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 Acne 05/20/2015  . Calculus of kidney 05/20/2015  . Chest pain, non-cardiac 05/20/2015  . Seizure ([place]) 05/20/2015  . Current tobacco use 05/20/2015  . Chronic kidney disease (CKD), stage III (moderate)  03/29/2015  . Insulin dependent type 2 diabetes mellitus ([place]) 03/15/2015  . Lipoma of shoulder 03/08/2014  . Other synovitis and tenosynovitis, right shoulder 03/08/2014  . Bursitis of elbow 02/15/2014  . Absolute anemia 08/08/2013  . 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 Back pain, chronic 08/08/2013  . Chronic obstructive pulmonary disease ([place]) 08/08/2013  . Diabetes mellitus ([place]) 08/08/2013  . BP (high blood pressure) 08/08/2013  . HLD (hyperlipidemia) 08/08/2013  . Acne erythematosa 08/08/2013    Orientation RESPIRATION BLADDER Height & Weight     Self, Time, Situation, Place    Incontinent Weight: 209 lb 7 oz (95 kg) Height:     BEHAVIORAL SYMPTOMS/MOOD NEUROLOGICAL BOWEL NUTRITION STATUS      Continent Diet(please see discharge summary)  AMBULATORY STATUS COMMUNICATION OF NEEDS Skin     Verbally (inscision closed arm left, incision closed neck)                       Personal Care Assistance Level of Assistance  Bathing, Feeding Bathing Assistance: Limited assistance Feeding assistance: Independent Dressing Assistance: Limited assistance     Functional Limitations Info  Sight, Hearing, Speech Sight Info: Adequate Hearing Info: Adequate Speech Info: Adequate    SPECIAL CARE FACTORS FREQUENCY  PT (By licensed PT), OT (By licensed OT)     PT Frequency: 3x per week OT Frequency: 3x per week            Contractures Contractures Info: Not present    Additional Factors Info  Code Status, Allergies([facility] patient) Code Status Info: FULL Allergies Info: NKA  Current Medications (04/10/2019):  This is the current hospital active medication list Current Facility-Administered Medications  Medication Dose Route Frequency Provider Last Rate Last Admin  . 0.9 %  sodium chloride infusion (Manually program via Guardrails IV Fluids)   Intravenous Once Carol Ada, MD      . atorvastatin (LIPITOR) tablet 80 mg  80 mg Oral Daily Carol Ada, MD   80 mg at 04/10/19 0211  . chlorhexidine (PERIDEX) 0.12 % solution 15 mL  15 mL Mouth Rinse BID Carol Ada, MD   15 mL at 04/09/19 0946  . Chlorhexidine Gluconate Cloth 2 % PADS 6 each  6 each Topical Daily Carol Ada, MD   6 each at 04/10/19 [phone]  . clopidogrel (PLAVIX) tablet 75 mg  75 mg Oral Daily Raiford [facility], DO   75 mg at 04/10/19 0851  . [START ON 04/11/2019] Darbepoetin Alfa (ARANESP) injection 100 mcg  100 mcg Intravenous Q Sat-HD Carol Ada, MD      . gentamicin cream (GARAMYCIN) 0.1 % 1 application  1 application Topical Daily Carol Ada, MD      . insulin aspart (novoLOG) injection 0-15 Units  0-15 Units Subcutaneous TID WC Carol Ada, MD   3 Units at 04/10/19 1146  . levETIRAcetam (KEPPRA) IVPB 500 mg/100 mL premix  500 mg Intravenous Q12H Carol Ada, MD 400 mL/hr at 04/10/19 0548 500 mg at 04/10/19 0548  . levETIRAcetam (KEPPRA) IVPB 500 mg/100 mL premix  500 mg Intravenous Q T,Th,Sa-HD Carol Ada, MD      . levothyroxine (SYNTHROID) tablet 200 mcg  200 mcg Oral q morning - 10a Carol Ada, MD   200 mcg at 04/10/19 0545  . multivitamin (RENA-VIT) tablet 1 tablet  1 tablet Oral QHS Carol Ada, MD   1 tablet at 04/09/19 2129  . naloxone [facility]) injection 0.4 mg  0.4 mg Intramuscular Once Carol Ada, MD      . naloxone [facility]) injection 0.4 mg  0.4 mg Intravenous PRN Carol Ada, MD      . pantoprazole (PROTONIX) injection 40 mg  40 mg Intravenous Q12H Carol Ada, MD   40 mg at 04/10/19 0802  . traZODone (DESYREL) tablet 50 mg  50 mg Oral QHS Carol Ada, MD   50 mg at 04/09/19 2129     Discharge Medications: Please see discharge summary for a list of discharge medications.  Relevant Imaging Results:  Relevant Lab Results:   Additional Information SSN [id number]     new dialysis patient  Vinie Sill, [place]

## 2019-04-10 NOTE — Progress Notes (Signed)
Physical Therapy Treatment Patient Details Name: Kyle [name] MRN: [id number] DOB: 09/06/1950 Today's Date: 04/10/2019    History of Present Illness The patient is a 69 year old Caucasian Male who was Patient was discharged from [facility] on 1/21 to CIR at [facility] after being treated for acute left midbrain stroke. He subsequently developed GI bleed  (acute onset of black and red stools X 5 overnight and also vomited coffee grounds one time while at rehab) and acute blood loss anemia and was transferred from Rehab and admitted to [facility] on 2/6. He has ESRD and is being dialyzed during his hospitalization Tuesdays Thursdays and Saturdays.   Nephrology and GI following: And the plan was for colonoscopy 04/08/19 however patient refused prep and has been refusing labwork and intervention and became combative and rude to staff. Because of this we consulted Neurology, Psychiatry, and repeat an MRI of the brain and EEG.   Pt has begun prep 2/11, colonoscopy pending.    PT Comments    Pt more alert and amenable to participating today.  He follows direction cues, but does lose focus on task, stopping frequently and needing cues to reinitiate.  Pt is quick to fatigue during gait which starts him listing to the R and R LE pattern degrading.     Follow Up Recommendations  SNF     Equipment Recommendations  Other (comment)(TBA)    Recommendations for Other Services       Precautions / Restrictions Precautions Precautions: Fall    Mobility  Bed Mobility               General bed mobility comments: up in the chair on arrival  Transfers Overall transfer level: Needs assistance Equipment used: Rolling walker (2 wheeled) Transfers: Sit to/from Stand Sit to Stand: Min assist         General transfer comment: cues for safer hand placement and stability assist throughout the stand.  Ambulation/Gait Ambulation/Gait assistance: Min assist;Mod assist;+2 safety/equipment Gait  Distance (Feet): 80 Feet(then 40 x2) Assistive device: Rolling walker (2 wheeled) Gait Pattern/deviations: Step-through pattern Gait velocity: decreased Gait velocity interpretation: <1.8 ft/sec, indicate of risk for recurrent falls General Gait Details: Initially, pt able to stay more in midline, slightly biased to R side of the RW.  Pt needing assist to control the RW.  As the distance increase and time in RW increase, pt fatigued and started listing to the R consitently and moderately.  At that point, pt needed more balance and AD control.Bonne Dolores workin on improved heel toe pattern on the R, as pt tended to whip/circumduct R foot while advancing.   Stairs             Wheelchair Mobility    Modified Rankin (Stroke Patients Only) Modified Rankin (Stroke Patients Only) Pre-Morbid Rankin Score: No symptoms Modified Rankin: Moderately severe disability     Balance Overall balance assessment: Needs assistance Sitting-balance support: Feet supported;Single extremity supported;No upper extremity supported Sitting balance-Leahy Scale: Fair       Standing balance-Leahy Scale: Poor Standing balance comment: reliant on AD or external support.  Even rested, pt tends to list posteriorly and right when he lets go of the AD                            Cognition Arousal/Alertness: Awake/alert Behavior During Therapy: [facility] for tasks assessed/performed Overall Cognitive Status: Within Functional Limits for tasks assessed(NT formally, but much  improved mentation)                                        Exercises      General Comments        Pertinent Vitals/Pain Pain Assessment: No/denies pain Pain Intervention(s): Monitored during session    Home Living                      Prior Function            PT Goals (current goals can now be found in the care plan section) Acute Rehab PT Goals PT Goal Formulation: With patient Time For Goal  Achievement: 04/23/19 Potential to Achieve Goals: Good Progress towards PT goals: Progressing toward goals    Frequency    Min 3X/week      PT Plan Current plan remains appropriate    Co-evaluation              AM-PAC PT "6 Clicks" Mobility   Outcome Measure  Help needed turning from your back to your side while in a flat bed without using bedrails?: A Little Help needed moving from lying on your back to sitting on the side of a flat bed without using bedrails?: A Little Help needed moving to and from a bed to a chair (including a wheelchair)?: A Little Help needed standing up from a chair using your arms (e.g., wheelchair or bedside chair)?: A Little Help needed to walk in hospital room?: A Lot Help needed climbing 3-5 steps with a railing? : A Lot 6 Click Score: 16    End of Session   Activity Tolerance: Patient tolerated treatment well;Patient limited by fatigue Patient left: in bed;with call bell/phone within reach;with bed alarm set Nurse Communication: Mobility status PT Visit Diagnosis: Unsteadiness on feet (R26.81);Other abnormalities of gait and mobility (R26.89);Hemiplegia and hemiparesis;Difficulty in walking, not elsewhere classified (R26.2) Hemiplegia - dominant/non-dominant: Dominant Hemiplegia - caused by: Cerebral infarction     Time: 1203-1227 PT Time Calculation (min) (ACUTE ONLY): 24 min  Charges:  $Gait Training: 8-22 mins $Therapeutic Activity: 8-22 mins                     04/10/2019  Ginger Carne., PT Acute Rehabilitation Services [phone]  (pager) [phone]  (office)   Tessie Fass Emmit Oriley 04/10/2019, 1:17 PM

## 2019-04-10 NOTE — Progress Notes (Signed)
PROGRESS NOTE    Kyle [name]  MPN:[id number] DOB: August 14, 1950 DOA: 04/04/2019 PCP: Idelle Crouch, MD   Brief Narrative:  The patient is a 69 year old Caucasian Male who was Patient was discharged from [facility] on 1/21 to CIR at [facility] after being treated for acute left midbrain stroke. He subsequently developed GI bleed(acute onset of black and red stools X 5 overnight and also vomited coffee grounds one timewhile at rehab)and acute blood loss anemia and was transferred from Rehab and admitted to [facility] on 2/6. He has ESRD and is being dialyzed during his hospitalization Tuesdays Thursdays and Saturdays. Nephrology and GI following: And the plan was for colonoscopy 04/08/19 however patient refused prep and has been refusing labwork and intervention and became combative and rude to staff. Because of this we consulted Neurology, Psychiatry, and repeat an MRI of the brain and EEG.   Patient is now improved more cooperative than drinking his bowel prep and having bowel movements colonoscopy yesterday which showed a single nonbleeding colonic angiodysplastic lesion that was treated with a monopolar probe and as well as diverticulosis in the entire examined colon and medium-sized lipoma in the transverse colon.  GI recommended switching the patient back to his Plavix and will continue the PPI and change to p.o. an outpatient diet has been advanced.  Currently patient is still awaiting outpatient placement in dialysis unit and he is scheduled for hemodialysis tomorrow.  Assessment & Plan:   Principal Problem:   GI bleed Active Problems:   Chronic obstructive pulmonary disease (HCC)   Diabetes mellitus (HCC)   Insulin dependent type 2 diabetes mellitus (HCC)   ESRD (end stage renal disease) ([place])   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HCC)   Acute upper GI bleed  GI bleed/Acute blood loss anemia in the setting of single colonic angiodysplastic lesion superimposed on chronic anemia of kidney  disease -S/p EGDno bleeding source find in EGDwith duodenitis and gastritis, patient has a colonoscopy in 11/2018 "adenomatous polyps and a tubulovillous adenoma were removed and extensive diverticulosis was noted" -He denies Abdominal Pain  -S/p Transfusion of 2 units of pRBC's, now hemoglobin/hematocrit has improved to 9.2/27.8 yesterday and today it is 8.8/26.6 -Nursing stated he had more bloody/melanotic BM the day before yesterday/yesterday but none overnight  -GI recommend continue PPIdrip through 04/07/19 then transition to IV BID PPI and have now changed to p.o. PPI -Colonoscopy done 04/09/2019 showed "A single non-bleeding colonic angiodysplastic lesion. Treated with a monopolar probe. Diverticulosis in the entire examined colon. Medium-sized lipoma in the transverse colon."  -Patient is again calmer today and pleasant and has had no behavioral issues  HTN: -Currently was 148/60 -Continued to Hold home bp meds includingnorvasc, cozaar but will slowly resume and resume in AM  -Change toprol-xl to low dose lopressor with holding parameters  CVA withdifficulty with balance and ptosis of left eyelid. -He was on asa/plavix for three wks then asa alone but now changed to just Plavix per the most recent Neurology recommendation -Mental status improved yesterday but, checking CT head after further discussion with wife behavior yesterday was not normal -Repeat Head CT showed "No acute abnormality. Expected evolution of the previously demonstrated left paramedian midbrain infarction. 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." -Now he remains agitated and combative and refusing interventions -Repeated Brain MRI w/o Contrast and Consult Psych for behavioral issues and Capacity Evaluation and have also asked neurology to come back; patient does have the mental capacity make  his own decisions and neurology feels that symptoms were related to  delirium  -Hold ASA in the setting of gi bleed, continueStatin -Repeat MRI as below and EEG normal  Seizure Disorder  -On Keppra chronically -Was on IV currently as he had refused meds but now will change to p.o.; Continued Levitracetam 500 mg IV q12h for now and he is getting additional dose but will change this to p.o. -Check EEG head was normal as below  Insulin DependentDM2 -He was discharged on insulin (lantus and ssi) from recent hospitalization -CBGs ranging from 95-172 -Continue with Moderate NovoLog/scale insulin before meals and at bedtime  AMS/Encephalopathy, improving likely was some hospital delirium, much improved -Patient is alert and oriented however wife states that this is a total change from his baseline; Today he is more appropriate and pleasant and cooperative  -Check blood cultures x2 and Pending, urinalysis and urine culture as well as chest x-ray  -Patient's PD fluid was negative for infection -Checked head MRI and showed "No acute intracranial abnormality. Expected evolution of midbrain infarct since 03/15/2019. 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 No seizures or epileptiform discharges were seen throughout the recording." -Neurology consulted for further evaluation; neurology recommends continuing Keppra and given additional dose after dialysis and recommend reinitiating either aspirin or Plavix when safe and reducing trazodone 50 mg p.o. nightly and suspects that the patient has had some delirium -Psychiatric consulted and patient does have mental capacity to make informed decisions and would benefit from being given in-depth information regards to his procedures and treatments -Appears more pleasant and oriented and appears back to baseline  ESRD was on peritoneal dialysis transitioned to HD on 2/2 Metabolic Acidosis, mild  -Status post RIJ TDC and left BCAVF  placed by Dr. Carlis Abbott 2/1. -Nephrology consulted for further evaluation; patient has a right upper extremity AV fistula that was placed on 03/30/2019 -Received HD 04/07/19 and again we will get it today -Discussed with Nephrology and they will obtain a PD sample to r/o Infection  -Patient's BUN/Cr went from 46/5.78 -> 53/7.08 -> 21/4.80 -> 22/5.50 -> 8/3.32 -Currently still awaiting outpatient HD placement ~; patient to dialyze in the morning -Other nephrotoxic medications, contrast dyes, hypotension and renally adjust medication  -Continue monitor trend renal function repeat CMP in a.m.  COPD -Not home O2 dependent, -No wheezing on exam, no hypoxia  Chronic RBBB -EKG stable  Hypothyroidism -Continue Levothyroxine 200 mcg po Daily but may need to change to IV since he has been refusing -Checked TSH and was 8.316 and Free T4 was 0.86   Thrombocytopenia -In the setting of his bleeding  -Patient's platelet count went from 124,000 -> 136,000 -> 150,000 -> 179,000 -> 183,000 -Continue to monitor for signs and symptoms of bleeding -Repeat CBC in a.m.  Hypomagnesemia  -Patient's magnesium level this morning is 1.4 -Replete with IV mag sulfate 2 g again -Continue monitor replete as necessary -Repeat Mag Level in the AM   Hypophosphatemia  -patient's phosphorus level was 2.1 this morning  -Replete with p.o. K-Phos neutral 500 mg x 1 -Continue monitor and trend -Repeat CMP in a.m.  Hypokalemia  -Patient's potassium this morning was 3.4 -Replete with po KCl 40 mEQ -Continue to Monitor and Replete as Necessary -Repeat CMP in AM   DVT prophylaxis: SCDs given his recent GI bleeding Code Status: FULL CODE Family Communication: Attempted to call wife but no one answered the  telephone and went to voicemail  Disposition Plan: Patient is from CIR and had a GI bleed; Required inpatient hospitalization for colonoscopy and workup; He is not as combative now and is improving and will need to  ensure his hemoglobin/hematocrit is stable prior to safely D/C'ing back to CIR vs. SNF; CIR cannot accept the patient back so he will need SNF and will need to CLIP to a Dialysis Center.   Consultants:   Gastroenterology  Nephrology  Neurology  Psychiatry   Procedures:  EGD on 04/05/2019 Findings:      The examined esophagus was normal.      The Z-line was regular and was found 42 cm from the incisors.      Localized moderate inflammation characterized by congestion (edema),       erosions and erythema was found in the prepyloric region of the stomach.      The cardia and gastric fundus were normal on retroflexion.      Patchy severe mucosal changes characterized by congestion, erythema,       erosion and nodularity were found in the duodenal bulb. Biopsies were       taken with a cold forceps for histology. Estimated blood loss was       minimal.      The second portion of the duodenum was normal. Impression:               - Normal esophagus.                           - Z-line regular, 42 cm from the incisors.                           - Gastritis.                           - Mucosal changes in the duodenum. Biopsied.                           - Normal second portion of the duodenum.  COLONOSCOPY 04/09/2019 Findings:      A single small localized angiodysplastic lesion without bleeding was       found in the transverse colon. Coagulation for tissue destruction using       monopolar probe was successful.      Scattered small and large-mouthed diverticula were found in the entire       colon.      There was a medium-sized lipoma, in the transverse colon.      Scattered diverticula were noted throughout the colon, but the highest       concentration was in the left side of the colon. A small nonbleeding AVM       was ablated with APC. There was evidence of bleeding in the cecum, but       with closer examination, this was consistent with barotrauma. Impression:               -  A single non-bleeding colonic angiodysplastic                            lesion. Treated with a monopolar probe.                           -  Diverticulosis in the entire examined colon.                           - Medium-sized lipoma in the transverse colon.                           - No specimens     Antimicrobials:  Anti-infectives (From admission, onward)   None     Subjective: Seen and examined at bedside he was in no acute distress and appears back to his baseline.  He is eating his breakfast and states that he is happy to finally.  No chest pain, lightheadedness or dizziness.  No nausea or vomiting.  No other concerns or complaints at this time.  Objective: Vitals:   04/09/19 2033 04/09/19 2329 04/10/19 0424 04/10/19 0736  BP: (!) 169/74 (!) 178/66 (!) 152/74 (!) 157/71  Pulse: 82 88 74 77  Resp: 12 16 15 15   Temp: 98.1 F (36.7 C) 97.9 F (36.6 C) 98.1 F (36.7 C) 98.9 F (37.2 C)  TempSrc: Oral Oral Oral Axillary  SpO2: 100% 99% 100% 100%  Weight:        Intake/Output Summary (Last 24 hours) at 04/10/2019 0754 Last data filed at 04/10/2019 0200 Gross per 24 hour  Intake 409.64 ml  Output 1660 ml  Net -1250.36 ml   Filed Weights   04/09/19 1359 04/09/19 1623 04/09/19 2000  Weight: 96.3 kg 96.5 kg 95 kg   Examination: Physical Exam:  Constitutional: WN/WD overweight Caucasian male in NAD and appears calm  Eyes: Lids and conjunctivae normal, sclerae anicteric  ENMT: Has Ptosis of the Left Eye from his CVA Neck: Appears normal, supple, no cervical masses, normal ROM, no appreciable thyromegaly Respiratory: Diminished to auscultation bilaterally, no wheezing, rales, rhonchi or crackles. Normal respiratory effort and patient is not tachypenic. No accessory muscle use. No JVD Cardiovascular: RRR, no murmurs / rubs / gallops. S1 and S2 auscultated. No extremity edema. 2+ pedal pulses. No carotid bruits.  Abdomen: Soft, non-tender, Distended 2/2 to body habitus.  Bowel sounds positive x4.  GU: Deferred. Musculoskeletal: No clubbing / cyanosis of digits/nails. No joint deformity upper and lower extremities.  Skin: No rashes, lesions, ulcers on a limited skin evaluation. No induration; Warm and dry.  Neurologic: CN 2-12 grossly intact with no focal deficits. Romberg sign and cerebellar reflexes not assessed.  Psychiatric: Normal judgment and insight. Alert and oriented x 3. Normal mood and appropriate affect.   Data Reviewed: I have personally reviewed following labs and imaging studies  CBC: Recent Labs  Lab 04/04/19 1543 04/05/19 0237 04/06/19 0833 04/07/19 0645 04/08/19 1612 04/09/19 0407 04/10/19 0229  WBC 12.8*  --  6.9 7.3 6.1 10.9* 6.8  NEUTROABS 10.4*  --  5.2  --  4.7 9.1* 5.2  HGB 6.1*   < > 9.0* 8.7* 8.8* 9.2* 8.8*  HCT 18.2*   < > 27.2* 26.3* 26.0* 27.8* 26.6*  MCV 94.3  --  93.2 93.3 92.9 93.0 92.4  PLT 183  --  124* 136* 150 179 183   < > = values in this interval not displayed.   Basic Metabolic Panel: Recent Labs  Lab 04/04/19 0240 04/04/19 0241 04/06/19 0833 04/07/19 1647 04/08/19 1612 04/09/19 0407 04/10/19 0229  NA 139   < > 138 136 140 140 137  K 4.7   < > 4.3 4.2 4.0 4.2 3.4*  CL 104   < >  106 107 101 104 102  CO2 24   < > 22 20* 24 23 26   GLUCOSE 133*   < > 104* 176* 124* 112* 179*  BUN 66*   < > 46* 53* 21 22 8   CREATININE 6.50*   < > 5.78* 7.08* 4.80* 5.50* 3.32*  CALCIUM 7.4*   < > 6.6* 6.3* 7.3* 7.4* 7.3*  MG  --   --   --   --  1.3* 1.4* 1.4*  PHOS 2.3*  --   --  3.0 2.8 2.9 2.1*   < > = values in this interval not displayed.   GFR: Estimated Creatinine Clearance: 24.8 mL/min (A) (by C-G formula based on SCr of 3.32 mg/dL (H)). Liver Function Tests: Recent Labs  Lab 04/04/19 0241 04/07/19 1647 04/08/19 1612 04/09/19 0407 04/10/19 0229  AST 21  --  22 21 19   ALT 11  --  13 15 13   ALKPHOS 50  --  66 68 68  BILITOT 0.4  --  0.9 0.9 0.6  PROT 4.3*  --  4.5* 4.7* 4.6*  ALBUMIN 1.9* 1.9* 2.0*  2.2* 2.1*   No results for input(s): LIPASE, AMYLASE in the last 168 hours. No results for input(s): AMMONIA in the last 168 hours. Coagulation Profile: No results for input(s): INR, PROTIME in the last 168 hours. Cardiac Enzymes: No results for input(s): CKTOTAL, CKMB, CKMBINDEX, TROPONINI in the last 168 hours. BNP (last 3 results) No results for input(s): PROBNP in the last 8760 hours. HbA1C: No results for input(s): HGBA1C in the last 72 hours. CBG: Recent Labs  Lab 04/08/19 2200 04/09/19 0755 04/09/19 1058 04/09/19 2153 04/10/19 0725  GLUCAP 112* 99 95 100* 102*   Lipid Profile: No results for input(s): CHOL, HDL, LDLCALC, TRIG, CHOLHDL, LDLDIRECT in the last 72 hours. Thyroid Function Tests: Recent Labs    04/08/19 1612 04/09/19 0838  TSH 8.316*  --   FREET4  --  0.86   Anemia Panel: No results for input(s): VITAMINB12, FOLATE, FERRITIN, TIBC, IRON, RETICCTPCT in the last 72 hours. Sepsis Labs: No results for input(s): PROCALCITON, LATICACIDVEN in the last 168 hours.  Recent Results (from the past 240 hour(s))  Surgical pcr screen     Status: None   Collection Time: 04/04/19  8:22 PM   Specimen: Nasal Mucosa; Nasal Swab  Result Value Ref Range Status   MRSA, PCR NEGATIVE NEGATIVE Final   Staphylococcus aureus NEGATIVE NEGATIVE Final    Comment: (NOTE) The Xpert SA Assay (FDA approved for NASAL specimens in patients 83 years of age and older), is one component of a comprehensive surveillance program. It is not intended to diagnose infection nor to guide or monitor treatment. Performed at [facility], [place] [address] [place], [place] [address]   Culture, blood (routine x 2)     Status: None (Preliminary result)   Collection Time: 04/08/19  4:12 PM   Specimen: BLOOD  Result Value Ref Range Status   Specimen Description BLOOD RIGHT ANTECUBITAL  Final   Special Requests   Final    BOTTLES DRAWN AEROBIC AND ANAEROBIC Blood Culture adequate volume    Culture   Final    NO GROWTH < 24 HOURS Performed at [facility], [place] [address] [place], [place] [address]    Report Status PENDING  Incomplete  Culture, blood (routine x 2)     Status: None (Preliminary result)   Collection Time: 04/08/19  4:12 PM   Specimen: BLOOD  RIGHT HAND  Result Value Ref Range Status   Specimen Description BLOOD RIGHT HAND  Final   Special Requests   Final    BOTTLES DRAWN AEROBIC AND ANAEROBIC Blood Culture adequate volume   Culture   Final    NO GROWTH < 24 HOURS Performed at [facility], [address], [place], [place] [address]    Report Status PENDING  Incomplete     RN Pressure Injury Documentation:     Estimated body mass index is 28.4 kg/m as calculated from the following:   Height as of 03/20/19: 6' (1.829 m).   Weight as of this encounter: 95 kg.  Malnutrition Type:  Nutrition Problem: Inadequate oral intake Etiology: inability to eat   Malnutrition Characteristics:  Signs/Symptoms: NPO status   Nutrition Interventions:  Interventions: Refer to RD note for recommendations  Radiology Studies: MR BRAIN WO CONTRAST  Result Date: 04/08/2019 CLINICAL DATA:  Encephalopathy. EXAM: MRI HEAD WITHOUT CONTRAST TECHNIQUE: Multiplanar, multiecho pulse sequences of the brain and surrounding structures were obtained without intravenous contrast. COMPARISON:  Head CT 04/07/2019 and MRI 03/15/2019 FINDINGS: Brain: There is only minimal residual diffusion signal abnormality in the left paramedian midbrain corresponding to the acute infarct on last month's MRI. No acute infarct, mass, midline shift, or extra-axial fluid collection is identified. Small chronic infarcts are again noted in the left paracentral pons, cerebellum, left thalamus, and white matter adjacent to the right frontal horn. T2 hyperintensities elsewhere in the cerebral white matter bilaterally are unchanged and nonspecific but compatible with moderate chronic small vessel  ischemic disease. There is moderate cerebral atrophy. Several scattered chronic microhemorrhages are again noted in the cerebrum and cerebellum. Vascular: Major intracranial vascular flow voids are preserved. Skull and upper cervical spine: Unremarkable bone marrow signal. Sinuses/Orbits: Unremarkable orbits. Near complete opacification of the left maxillary sinus by complex material. No significant mastoid fluid. Other: None. IMPRESSION: 1. No acute intracranial abnormality. 2. Expected evolution of midbrain infarct since 03/15/2019. 3. Chronic small vessel ischemic disease with multiple old infarcts as above. Electronically Signed   By: Logan Bores M.D.   On: 04/08/2019 15:17   DG CHEST PORT 1 VIEW  Result Date: 04/08/2019 CLINICAL DATA:  Shortness of breath EXAM: PORTABLE CHEST 1 VIEW COMPARISON:  03/29/2018 FINDINGS: Cardiac shadow is mildly enlarged but stable. Dialysis catheter is again seen in satisfactory position. The lungs are clear bilaterally. Old rib fracture with healing on the left is seen. IMPRESSION: No acute abnormality noted. Electronically Signed   By: [name] M.D.   On: 04/08/2019 12:40   EEG adult  Result Date: 04/08/2019 Lora Havens, MD     04/08/2019  5:22 PM Patient Name: Kyle [name] MRN: [id number] Epilepsy Attending: Lora Havens Referring Physician/Provider: Dr Baird Kay Date: 04/08/2019 Duration: 22.44 mins Patient history: 69 yo M with multiple chronic infarcts in left midbrain, left pons, cerebellum, left thalamus and epilepsy who presented with ams. EEG to evaluate for seizure. Level of alertness: awake, asleep AEDs during EEG study: LEV Technical aspects: This EEG study was done with scalp electrodes positioned according to the 10-20 International system of electrode placement. Electrical activity was acquired at a sampling rate of 500Hz  and reviewed with a high frequency filter of 70Hz  and a low frequency filter of 1Hz . EEG data were recorded  continuously and digitally stored. DESCRIPTION: The posterior dominant rhythm consists of 7.5Hz  activity of moderate voltage (25-35 uV) seen predominantly in posterior head regions, symmetric and reactive to  eye opening and eye closing. Sleep was characterized by vertex waves.  Hyperventilation and photic stimulation were not performed. IMPRESSION: This study is within normal limits. No seizures or epileptiform discharges were seen throughout the recording. Priyanka Barbra Sarks   Scheduled Meds: . sodium chloride   Intravenous Once  . atorvastatin  80 mg Oral Daily  . chlorhexidine  15 mL Mouth Rinse BID  . Chlorhexidine Gluconate Cloth  6 each Topical Daily  . [START ON 04/11/2019] darbepoetin (ARANESP) injection - DIALYSIS  100 mcg Intravenous Q Sat-HD  . gentamicin cream  1 application Topical Daily  . insulin aspart  0-15 Units Subcutaneous TID WC  . levothyroxine  200 mcg Oral q morning - 10a  . multivitamin  1 tablet Oral QHS  . naloxone  0.4 mg Intramuscular Once  . pantoprazole  40 mg Intravenous Q12H  . traZODone  50 mg Oral QHS   Continuous Infusions: . levETIRAcetam 500 mg (04/10/19 0548)  . levETIRAcetam      LOS: 6 days   Kerney Elbe, DO [facility] PAGER is on [place]  If 7PM-7AM, please contact night-coverage [email or web address]

## 2019-04-10 NOTE — TOC Initial Note (Signed)
Transition of Care [facility]) - Initial/Assessment Note    Patient Details  Name: Kyle [name] MRN: [id number] Date of Birth: 1950-08-03  Transition of Care [facility]) CM/SW Contact:    Vinie Sill, [place] Phone Number: 04/10/2019, 4:04 PM  Clinical Narrative:                  CSW spoke with the patient's wife, [name]. CSW introduced self and explained role. CSW discussed PT recommendation of ST rehab at [facility]. CSW was informed patient and spouse lives in the home alone. Pateint's wife states he have never been to rehab at [facility] before. She is agreeable to ST rehab at [facility] and states no SNF  Preference. CSW was given permission to send out ST rehab referrals. CSW explained the SNF process and answered all questions.   CSW will provide bed offers once available. CSW will continue to follow and assist with discharge planning.  Thurmond Butts, MSW, [place] Clinical Social Worker   Expected Discharge Plan: Skilled Nursing Facility Barriers to Discharge: Continued Medical Work up   Patient Goals and CMS Choice        Expected Discharge Plan and Services Expected Discharge Plan: [place] In-house Referral: Clinical Social Work     Living arrangements for the past 2 months: Single Family Home                                      Prior Living Arrangements/Services Living arrangements for the past 2 months: Single Family Home Lives with:: Self, Spouse Patient language and need for interpreter reviewed:: No        Need for Family Participation in Patient Care: Yes (Comment) Care giver support system in place?: Yes (comment)   Criminal Activity/Legal Involvement Pertinent to Current Situation/Hospitalization: No - Comment as needed  Activities of Daily Living      Permission Sought/Granted Permission sought to share information with : Family Supports Permission granted to share information with : Yes, Verbal Permission Granted  Share Information with  NAME: Hartstein,[name]  Permission granted to share info w AGENCY: SNFs  Permission granted to share info w Relationship: spouse  Permission granted to share info w Contact Information: [phone]  Emotional Assessment       Orientation: : Oriented to Self, Oriented to Place, Oriented to  Time, Oriented to Situation Alcohol / Substance Use: Not Applicable Psych Involvement: No (comment)  Admission diagnosis:  Acute upper GI bleed [K92.2] Patient Active Problem List   Diagnosis Date Noted  . GI bleed 04/04/2019  . Acute upper GI bleed 04/04/2019  . 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[place]) 03/20/2019  . Generalized weakness   . Diabetic polyneuropathy associated with type 2 diabetes mellitus ([place])   . Cerebrovascular accident (CVA) ([place])   . PVD (peripheral vascular disease) ([place]) 03/17/2019  . Ptosis of eyelid, left 03/15/2019  . Physical deconditioning 03/14/2019  . Hypomagnesemia 03/13/2019  . Hyperphosphatemia 03/13/2019  . ESRD (end stage renal disease) ([place]) 03/13/2019  . 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 Prolonged QT interval 03/13/2019  . Essential hypertension 03/13/2019  . Hypothyroidism 03/13/2019  . Chronic diarrhea 03/13/2019  . Hypocalcemia 03/12/2019  . Hypokalemia 09/16/2018  . Lung nodule 06/12/2016  . Narrowing of airway   . Cigarette smoker 05/24/2016  . Collapse of right lung 05/24/2016  . COPD with chronic bronchitis ([place]) 05/24/2016  .  Acne 05/20/2015  . Calculus of kidney 05/20/2015  . Chest pain, non-cardiac 05/20/2015  . Seizure ([place]) 05/20/2015  . Current tobacco use 05/20/2015  . Chronic kidney disease (CKD), stage III (moderate) 03/29/2015  . Insulin dependent type 2 diabetes mellitus ([place]) 03/15/2015  . Lipoma of shoulder 03/08/2014  . Other synovitis and tenosynovitis, right shoulder 03/08/2014  . Bursitis of elbow 02/15/2014  . Absolute anemia 08/08/2013  . Benign fibroma of prostate  08/08/2013  . Back pain, chronic 08/08/2013  . Chronic obstructive pulmonary disease ([place]) 08/08/2013  . Diabetes mellitus ([place]) 08/08/2013  . BP (high blood pressure) 08/08/2013  . HLD (hyperlipidemia) 08/08/2013  . Acne erythematosa 08/08/2013   PCP:  Idelle Crouch, MD Pharmacy:   [facility] [address], [place] [address] Phone: [phone] Fax: [place], [place] [address] [place] [address] Phone: [phone] Fax: [place], [place] [address] Phone: [phone] Fax: [phone]     Social Determinants of Health (SDOH) Interventions    Readmission Risk Interventions No flowsheet data found.

## 2019-04-10 NOTE — Progress Notes (Signed)
Nutrition Follow-up  DOCUMENTATION CODES:   Not applicable  INTERVENTION:  Provide 30 ml Prostat po BID, each supplement provides 100 kcal and 15 grams of protein.   Encourage adequate PO intake.   NUTRITION DIAGNOSIS:   Inadequate oral intake related to inability to eat as evidenced by NPO status; diet advanced; improved  GOAL:   Patient will meet greater than or equal to 90% of their needs; progressing  MONITOR:   PO intake, Supplement acceptance, Skin, Weight trends, Labs, I & O's  REASON FOR ASSESSMENT:   NPO/Clear Liquid Diet    ASSESSMENT:   69 year old Caucasian Male who was Patient was discharged from [facility] on 1/21 to CIR at [facility] after being treated for acute left midbrain stroke. He subsequently developed GI bleed  (acute onset of black and red stools X 5 overnight and also vomited coffee grounds one time while at rehab) and acute blood loss anemia and was transferred from Rehab and admitted to [facility] on 2/6. He has ESRD and is being dialyzed during his hospitalization Tuesdays Thursdays and Saturdays. Nephrology and GI following with plans for colonoscopy.  Pt underwent colonoscopy yesterday. 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 Diet has been advanced. RD to order nutritional supplements to aid in caloric and protein needs.   Labs and medications reviewed.   Diet Order:   Diet Order            Diet renal with fluid restriction Fluid restriction: 1200 mL Fluid; Room service appropriate? Yes; Fluid consistency: Thin  Diet effective now              EDUCATION NEEDS:   Not appropriate for education at this time  Skin:  Skin Assessment: Reviewed RN Assessment  Last BM:  2/11  Height:   Ht Readings from Last 1 Encounters:  03/20/19 6' (1.829 m)    Weight:   Wt Readings from Last 1 Encounters:  04/09/19 95 kg    Ideal Body Weight:  80.9 kg  BMI:  Body mass index is  28.4 kg/m.  Estimated Nutritional Needs:   Kcal:  2050-2300  Protein:  105-115 grams  Fluid:  1 L + UOP   Corrin Parker, MS, RD, LDN Pager # [phone] After hours/ weekend pager # [phone]

## 2019-04-11 LAB — CBC WITH DIFFERENTIAL/PLATELET
Abs Immature Granulocytes: 0.05 10*3/uL (ref 0.00–0.07)
Basophils Absolute: 0.1 10*3/uL (ref 0.0–0.1)
Basophils Relative: 1 %
Eosinophils Absolute: 0.2 10*3/uL (ref 0.0–0.5)
Eosinophils Relative: 2 %
HCT: 26.2 % — ABNORMAL LOW (ref 39.0–52.0)
Hemoglobin: 8.6 g/dL — ABNORMAL LOW (ref 13.0–17.0)
Immature Granulocytes: 1 %
Lymphocytes Relative: 18 %
Lymphs Abs: 1.2 10*3/uL (ref 0.7–4.0)
MCH: 31 pg (ref 26.0–34.0)
MCHC: 32.8 g/dL (ref 30.0–36.0)
MCV: 94.6 fL (ref 80.0–100.0)
Monocytes Absolute: 0.5 10*3/uL (ref 0.1–1.0)
Monocytes Relative: 7 %
Neutro Abs: 4.7 10*3/uL (ref 1.7–7.7)
Neutrophils Relative %: 71 %
Platelets: 192 10*3/uL (ref 150–400)
RBC: 2.77 MIL/uL — ABNORMAL LOW (ref 4.22–5.81)
RDW: 15.3 % (ref 11.5–15.5)
WBC: 6.7 10*3/uL (ref 4.0–10.5)
nRBC: 0 % (ref 0.0–0.2)

## 2019-04-11 LAB — GLUCOSE, CAPILLARY
Glucose-Capillary: 109 mg/dL — ABNORMAL HIGH (ref 70–99)
Glucose-Capillary: 216 mg/dL — ABNORMAL HIGH (ref 70–99)
Glucose-Capillary: 237 mg/dL — ABNORMAL HIGH (ref 70–99)
Glucose-Capillary: 203 mg/dL — ABNORMAL HIGH (ref 70–99)

## 2019-04-11 LAB — COMPREHENSIVE METABOLIC PANEL
ALT: 13 U/L (ref 0–44)
AST: 17 U/L (ref 15–41)
Albumin: 2.1 g/dL — ABNORMAL LOW (ref 3.5–5.0)
Alkaline Phosphatase: 59 U/L (ref 38–126)
Anion gap: 9 (ref 5–15)
BUN: 16 mg/dL (ref 8–23)
CO2: 25 mmol/L (ref 22–32)
Calcium: 7.3 mg/dL — ABNORMAL LOW (ref 8.9–10.3)
Chloride: 105 mmol/L (ref 98–111)
Creatinine, Ser: 5.42 mg/dL — ABNORMAL HIGH (ref 0.61–1.24)
GFR calc Af Amer: 11 mL/min — ABNORMAL LOW (ref >60)
GFR calc non Af Amer: 10 mL/min — ABNORMAL LOW (ref >60)
Glucose, Bld: 135 mg/dL — ABNORMAL HIGH (ref 70–99)
Potassium: 3.6 mmol/L (ref 3.5–5.1)
Sodium: 139 mmol/L (ref 135–145)
Total Bilirubin: 0.6 mg/dL (ref 0.3–1.2)
Total Protein: 4.7 g/dL — ABNORMAL LOW (ref 6.5–8.1)

## 2019-04-11 LAB — MAGNESIUM: Magnesium: 1.9 mg/dL (ref 1.7–2.4)

## 2019-04-11 LAB — PHOSPHORUS: Phosphorus: 2.9 mg/dL (ref 2.5–4.6)

## 2019-04-11 MED ORDER — METOPROLOL SUCCINATE ER 50 MG PO TB24
50.0000 mg | ORAL_TABLET | Freq: Every day | ORAL | Status: DC
  Administered 2019-04-11 – 2019-04-16 (×6): 50 mg via ORAL
  Filled 2019-04-11 (×6): qty 1

## 2019-04-11 MED ORDER — HEPARIN SODIUM (PORCINE) 5000 UNIT/ML IJ SOLN
5000.0000 [IU] | Freq: Three times a day (TID) | INTRAMUSCULAR | Status: DC
  Administered 2019-04-11 – 2019-04-16 (×14): 5000 [IU] via SUBCUTANEOUS
  Filled 2019-04-11 (×15): qty 1

## 2019-04-11 MED ORDER — AMLODIPINE BESYLATE 2.5 MG PO TABS
2.5000 mg | ORAL_TABLET | Freq: Every day | ORAL | Status: DC
  Administered 2019-04-11 – 2019-04-16 (×6): 2.5 mg via ORAL
  Filled 2019-04-11 (×6): qty 1

## 2019-04-11 NOTE — Progress Notes (Signed)
[facility] Progress Note    Assessment/ Plan:    ESRD: Transitioned from PD to HD on 2/2, last session 2/11.  RUE AVF placed on 2/1.  Following T/Th/Sat schedule, HD today. Awaiting outpatient HD placement.    Anemia of CKD   BL Anemia 2/2 angiodysplastic lesion: Hgb stable. S/p 4 pRBCs and coagulation of lesion on 2/11 per GI. Receiving Aranesp weekly.   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   Hypertension: Elevated, SBP 150-170s. On metoprolol.  Will restart Norvasc 2.5 mg.    Intermittent Agitation: Improving, suspected delirium. EEG and MRI wnl. Plan per primary and neurology.    Nutrition: Albumin 2.1. Renavit MVI daily.    Acute Pontine Stroke: Continued rehabilitation.  Restarted plavix on 2/12.    T2DM  Hypothyroidism: Per primary.    Subjective:   No acute events overnight.  Doing well, seen in HD this morning.   Objective:   BP (!) 172/69 (BP Location: Right Arm)   Pulse 77   Temp 97.6 F (36.4 C) (Oral)   Resp 14   Wt 95 kg   SpO2 100%   BMI 28.40 kg/m   Intake/Output Summary (Last 24 hours) at 04/11/2019 0741 Last data filed at 04/10/2019 0841 Gross per 24 hour  Intake 200 ml  Output --  Net 200 ml   Weight change:   Physical Exam: General: No acute distress, and HD Cardiac: Regular rate and rhythm Lungs: Clear anteriorly, unlabored breathing Extremities: Warm, dry, no edema Access: Right IJ tunnel catheter with LUE aVF with palpable thrill and audible bruit    Imaging: No results found.  Labs: BMET Recent Labs  Lab 04/05/19 0905 04/06/19 5400 04/07/19 1647 04/08/19 1612 04/09/19 0407 04/10/19 0229 04/11/19 0526  NA 138 138 136 140 140 137 139  K 4.1 4.3 4.2 4.0 4.2 3.4* 3.6  CL 99 106 107 101 104 102 105  CO2  --  22 20* 24 23 26 25   GLUCOSE 88 104* 176* 124* 112* 179* 135*  BUN 31* 46* 53* 21 22 8 16   CREATININE 4.20* 5.78* 7.08* 4.80* 5.50* 3.32* 5.42*  CALCIUM  --  6.6* 6.3* 7.3* 7.4* 7.3* 7.3*   PHOS  --   --  3.0 2.8 2.9 2.1* 2.9   CBC Recent Labs  Lab 04/08/19 1612 04/09/19 0407 04/10/19 0229 04/11/19 0526  WBC 6.1 10.9* 6.8 6.7  NEUTROABS 4.7 9.1* 5.2 4.7  HGB 8.8* 9.2* 8.8* 8.6*  HCT 26.0* 27.8* 26.6* 26.2*  MCV 92.9 93.0 92.4 94.6  PLT 150 179 183 192    Medications:    . sodium chloride   Intravenous Once  . atorvastatin  80 mg Oral Daily  . chlorhexidine  15 mL Mouth Rinse BID  . Chlorhexidine Gluconate Cloth  6 each Topical Daily  . clopidogrel  75 mg Oral Daily  . darbepoetin (ARANESP) injection - DIALYSIS  100 mcg Intravenous Q Sat-HD  . feeding supplement (PRO-STAT SUGAR FREE 64)  30 mL Oral BID  . gentamicin cream  1 application Topical Daily  . insulin aspart  0-15 Units Subcutaneous TID WC  . levETIRAcetam  500 mg Oral BID  . levETIRAcetam  500 mg Oral Q T,Th,Sa-HD  . levothyroxine  200 mcg Oral q morning - 10a  . multivitamin  1 tablet Oral QHS  . naloxone  0.4 mg Intramuscular Once  . pantoprazole  40 mg Oral BID  . traZODone  50 mg Oral QHS  Darrelyn Hillock, DO Family Medicine PGY-2  04/11/2019, 7:41 AM

## 2019-04-11 NOTE — Progress Notes (Signed)
PROGRESS NOTE    Kyle [name]  ZSW:[id number] DOB: 06/13/1950 DOA: 04/04/2019 PCP: Idelle Crouch, MD   Brief Narrative:  The patient is a 69 year old Caucasian Male who was Patient was discharged from [facility] on 1/21 to CIR at [facility] after being treated for acute left midbrain stroke. He subsequently developed GI bleed(acute onset of black and red stools X 5 overnight and also vomited coffee grounds one timewhile at rehab)and acute blood loss anemia and was transferred from Rehab and admitted to [facility] on 2/6. He has ESRD and is being dialyzed during his hospitalization Tuesdays Thursdays and Saturdays. Nephrology and GI following: And the plan was for colonoscopy 04/08/19 however patient refused prep and has been refusing labwork and intervention and became combative and rude to staff. Because of this we consulted Neurology, Psychiatry, and repeat an MRI of the brain and EEG.   Patient is now improved more cooperative and had his bowel prep and having bowel movements. Underwent colonoscopy which showed a single nonbleeding colonic angiodysplastic lesion that was treated with a monopolar probe and as well as diverticulosis in the entire examined colon and medium-sized lipoma in the transverse colon.  GI recommended switching the patient back to his Plavix and will continue the PPI and change to p.o. an outpatient diet has been advanced.  Currently patient is still awaiting outpatient placement in dialysis unit and he is being dialyzed today.   Assessment & Plan:   Principal Problem:   GI bleed Active Problems:   Chronic obstructive pulmonary disease (HCC)   Diabetes mellitus (HCC)   Insulin dependent type 2 diabetes mellitus (HCC)   ESRD (end stage renal disease) ([place])   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HCC)   Acute upper GI bleed  GI bleed/Acute blood loss anemia in the setting of single colonic angiodysplastic lesion superimposed on chronic anemia of kidney disease -S/p EGDno bleeding  source find in EGDwith duodenitis and gastritis, patient has a colonoscopy in 11/2018 "adenomatous polyps and a tubulovillous adenoma were removed and extensive diverticulosis was noted" -He denies Abdominal Pain  -S/p Transfusion of 2 units of pRBC's, now hemoglobin/hematocrit has improved to 9.2/27.8 the day before yesterday and yesterday it was 8.8/26.6 and today it remains relatively stable at 8.6/26.2 -Continue to Monitor for Melanotic Stools. Patient states he is no longer having them  -GI recommend continue PPIdrip through 04/07/19 then transition to IV BID PPI and have now changed to p.o. PPI -Colonoscopy done 04/09/2019 showed "A single non-bleeding colonic angiodysplastic lesion. Treated with a monopolar probe. Diverticulosis in the entire examined colon. Medium-sized lipoma in the transverse colon."  -Patient is again calmer today and pleasant and has had no behavioral issues sitting in dialysis   HTN: -Currently was 172/69 this AM  -Continued to Hold home bp meds includingnorvasc, cozaar but now slowly resuming; Continuing to hold Cozaar today  -Resumed Amlodipine 2.5 mg po Daily and Increase Metoprolol back to Home dose  - CVA withdifficulty with balance and ptosis of left eyelid; CVA from small vessel disease -He was on asa/plavix for three wks then asa alone but now changed to just Plavix per the most recent Neurology recommendation (Could do Either ASA or Plavix) -Mental status improved yesterday but, checking CT head after further discussion with wife behavior yesterday was not normal -Repeat Head CT showed "No acute abnormality. Expected evolution of the previously demonstrated left paramedian midbrain infarction. 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." -Now he remains  agitated and combative and refusing interventions -Repeated Brain MRI w/o Contrast and Consult Psych for behavioral issues and Capacity Evaluation and have also  asked neurology to come back; patient does have the mental capacity make his own decisions and neurology feels that symptoms were related to delirium  -Held ASA and Plavix in the setting of gi bleed initially but will not continue DAPT; -ContinueAtorvaStatin 80 mg po Daily  -Repeat MRI as below and EEG normal   Seizure Disorder  -On Keppra chronically -Was on IV currently as he had refused meds but now will change to p.o.; Continued Levitracetam 500 mg IV q12h for now and he is getting additional dose but will change this to p.o. a -Check EEG head was normal as below  Insulin DependentDM2 -He was discharged on insulin (lantus and ssi) from recent hospitalization -CBGs ranging from 100-240 -Continue with Moderate NovoLog/scale insulin before meals and at bedtime  AMS/Encephalopathy, likely metabolic, improving likely was some hospital delirium, much improved -Patient is alert and oriented however wife states that this is a total change from his baseline; Today he is more appropriate and pleasant and cooperative  -Check blood cultures x2 and Pending, urinalysis and urine culture as well as chest x-ray  -Patient's PD fluid was negative for infection -Checked head MRI and showed "No acute intracranial abnormality. Expected evolution of midbrain infarct since 03/15/2019. 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 No seizures or epileptiform discharges were seen throughout the recording." -Neurology consulted for further evaluation; neurology recommends continuing Keppra and given additional dose after dialysis and recommend reinitiating either aspirin or Plavix when safe and reducing trazodone 50 mg p.o. nightly and suspects that the patient has had some delirium -Psychiatric consulted and patient does have mental capacity to make informed decisions and would benefit from being given in-depth  information regards to his procedures and treatments -Appears more pleasant and oriented and appears back to baseline  ESRD was on peritoneal dialysis transitioned to HD on 2/2 Metabolic Acidosis, mild  -Status post RIJ TDC and left BCAVF placed by Dr. Carlis Abbott 2/1. -Nephrology consulted for further evaluation; patient has a right upper extremity AV fistula that was placed on 03/30/2019 -Received HD 04/07/19, 04/09/19 and again will get it today  -Discussed with Nephrology and they will obtain a PD sample to r/o Infection  -Patient's BUN/Cr went from 46/5.78 -> 53/7.08 -> 21/4.80 -> 22/5.50 -> 8/3.32 -> 16/5.42 -Currently still awaiting outpatient HD placement and Nephrology stated might be a little difficult since he dialyzes at [place];  -Patient to Dialyze today  -Other nephrotoxic medications, contrast dyes, hypotension and renally adjust medication  -Continue monitor trend renal function repeat CMP in a.m.  COPD -Not home O2 dependent, -No wheezing on exam, no hypoxia  Chronic RBBB -EKG stable  Hypothyroidism -Continue Levothyroxine 200 mcg po Daily but may need to change to IV since he has been refusing -Checked TSH and was 8.316 and Free T4 was 0.86   Thrombocytopenia -In the setting of his bleeding  -Patient's platelet count went from 124,000 -> 136,000 -> 150,000 -> 179,000 -> 183,000 -> 192,000 -Continue to monitor for signs and symptoms of bleeding -Repeat CBC in a.m.  Hypomagnesemia  -Patient's magnesium level this morning was 1.4 and is improved to 1.9  -Continue to monitor and replete as necessary -Repeat Mag Level in the AM   Hypophosphatemia  -Patient's phosphorus level was 2.1 and improved  to 2.9 -Continue monitor and Replete as Necessary  -Repeat CMP in a.m.  Hypokalemia  -Patient's potassium this morning was 3.6 -Replete with po KCl 40 mEQ yesterday  -Continue to Monitor and Replete as Necessary -Repeat CMP in AM   DVT prophylaxis: SCDs given his  recent GI bleeding; Will add Heparin 5,000 units sq back today  Code Status: FULL CODE Family Communication: No family at bedside but updated updated wife via the telephone Disposition Plan: Patient is from CIR and had a GI bleed; Required inpatient hospitalization for colonoscopy and workup; He is not combative now and is improving and will need to ensure his hemoglobin/hematocrit is stable prior to safely D/C'ing back to CIR vs. SNF; CIR cannot accept the patient back so he will need SNF and will need to CLIP to a Dialysis Center but this is currently still pending to be done.   Consultants:   Gastroenterology  Nephrology  Neurology  Psychiatry   Procedures:  EGD on 04/05/2019 Findings:      The examined esophagus was normal.      The Z-line was regular and was found 42 cm from the incisors.      Localized moderate inflammation characterized by congestion (edema),       erosions and erythema was found in the prepyloric region of the stomach.      The cardia and gastric fundus were normal on retroflexion.      Patchy severe mucosal changes characterized by congestion, erythema,       erosion and nodularity were found in the duodenal bulb. Biopsies were       taken with a cold forceps for histology. Estimated blood loss was       minimal.      The second portion of the duodenum was normal. Impression:               - Normal esophagus.                           - Z-line regular, 42 cm from the incisors.                           - Gastritis.                           - Mucosal changes in the duodenum. Biopsied.                           - Normal second portion of the duodenum.  COLONOSCOPY 04/09/2019 Findings:      A single small localized angiodysplastic lesion without bleeding was       found in the transverse colon. Coagulation for tissue destruction using       monopolar probe was successful.      Scattered small and large-mouthed diverticula were found in the entire        colon.      There was a medium-sized lipoma, in the transverse colon.      Scattered diverticula were noted throughout the colon, but the highest       concentration was in the left side of the colon. A small nonbleeding AVM       was ablated with APC. There was evidence of bleeding in the cecum, but       with closer examination, this was  consistent with barotrauma. Impression:               - A single non-bleeding colonic angiodysplastic                            lesion. Treated with a monopolar probe.                           - Diverticulosis in the entire examined colon.                           - Medium-sized lipoma in the transverse colon.                           - No specimens     Antimicrobials:  Anti-infectives (From admission, onward)   None     Subjective: Seen and examined at bedside while he was in dialysis today and he had no complaints.  Denying any pain.  States that he okay night.  No further bleeding noted from his aunt and happy that he is eating.  No other concerns or complaints at this time.  Objective: Vitals:   04/10/19 2200 04/10/19 2300 04/10/19 2316 04/11/19 0324  BP:   (!) 157/51 (!) 172/69  Pulse: 72 76 72 77  Resp: 16 20 13 14   Temp:   98.4 F (36.9 C) 97.6 F (36.4 C)  TempSrc:   Oral Oral  SpO2: 100% 100% 99% 100%  Weight:        Intake/Output Summary (Last 24 hours) at 04/11/2019 0800 Last data filed at 04/10/2019 1610 Gross per 24 hour  Intake 0 ml  Output -  Net 0 ml   Filed Weights   04/09/19 1359 04/09/19 1623 04/09/19 2000  Weight: 96.3 kg 96.5 kg 95 kg   Examination: Physical Exam:  Constitutional: WN/WD overweight Caucasian male currently no acute distress appears calm sitting, NAD and appears calm and comfortable Eyes: Has ptosis of the left eye from his CVA but other lid appeared normal ENMT: External Ears, Nose appear normal. Grossly normal hearing.  Neck: Appears normal, supple, no cervical masses, normal ROM, no  appreciable thyromegaly; no JVD Respiratory: Diminished to auscultation bilaterally, no wheezing, rales, rhonchi or crackles. Normal respiratory effort and patient is not tachypenic. No accessory muscle use.  Unlabored breathing Cardiovascular: RRR, no murmurs / rubs / gallops. S1 and S2 auscultated.  Trace extremity edema Abdomen: Soft, non-tender, distended secondary body habitus. No masses palpated. No appreciable hepatosplenomegaly. Bowel sounds positive.  GU: Deferred. Musculoskeletal: No clubbing / cyanosis of digits/nails. No joint deformity upper and lower extremities.  Skin: No rashes, lesions, ulcers on a limited skin evaluation. No induration; Warm and dry.  Neurologic: CN 2-12 grossly intact with no focal deficits except that his eye is drooping on the left has ptosis. Romberg sign and cerebellar reflexes not assessed.  Psychiatric: Normal judgment and insight. Alert and oriented x 3. Normal mood and appropriate affect.   Data Reviewed: I have personally reviewed following labs and imaging studies  CBC: Recent Labs  Lab 04/06/19 0833 04/06/19 0833 04/07/19 0645 04/08/19 1612 04/09/19 0407 04/10/19 0229 04/11/19 0526  WBC 6.9   < > 7.3 6.1 10.9* 6.8 6.7  NEUTROABS 5.2  --   --  4.7 9.1* 5.2 4.7  HGB 9.0*   < > 8.7* 8.8*  9.2* 8.8* 8.6*  HCT 27.2*   < > 26.3* 26.0* 27.8* 26.6* 26.2*  MCV 93.2   < > 93.3 92.9 93.0 92.4 94.6  PLT 124*   < > 136* 150 179 183 192   < > = values in this interval not displayed.   Basic Metabolic Panel: Recent Labs  Lab 04/07/19 1647 04/08/19 1612 04/09/19 0407 04/10/19 0229 04/11/19 0526  NA 136 140 140 137 139  K 4.2 4.0 4.2 3.4* 3.6  CL 107 101 104 102 105  CO2 20* 24 23 26 25   GLUCOSE 176* 124* 112* 179* 135*  BUN 53* 21 22 8 16   CREATININE 7.08* 4.80* 5.50* 3.32* 5.42*  CALCIUM 6.3* 7.3* 7.4* 7.3* 7.3*  MG  --  1.3* 1.4* 1.4* 1.9  PHOS 3.0 2.8 2.9 2.1* 2.9   GFR: Estimated Creatinine Clearance: 15.2 mL/min (A) (by C-G formula  based on SCr of 5.42 mg/dL (H)). Liver Function Tests: Recent Labs  Lab 04/07/19 1647 04/08/19 1612 04/09/19 0407 04/10/19 0229 04/11/19 0526  AST  --  22 21 19 17   ALT  --  13 15 13 13   ALKPHOS  --  66 68 68 59  BILITOT  --  0.9 0.9 0.6 0.6  PROT  --  4.5* 4.7* 4.6* 4.7*  ALBUMIN 1.9* 2.0* 2.2* 2.1* 2.1*   No results for input(s): LIPASE, AMYLASE in the last 168 hours. No results for input(s): AMMONIA in the last 168 hours. Coagulation Profile: No results for input(s): INR, PROTIME in the last 168 hours. Cardiac Enzymes: No results for input(s): CKTOTAL, CKMB, CKMBINDEX, TROPONINI in the last 168 hours. BNP (last 3 results) No results for input(s): PROBNP in the last 8760 hours. HbA1C: No results for input(s): HGBA1C in the last 72 hours. CBG: Recent Labs  Lab 04/09/19 2153 04/10/19 0725 04/10/19 1107 04/10/19 1628 04/10/19 2124  GLUCAP 100* 102* 172* 240* 160*   Lipid Profile: No results for input(s): CHOL, HDL, LDLCALC, TRIG, CHOLHDL, LDLDIRECT in the last 72 hours. Thyroid Function Tests: Recent Labs    04/08/19 1612 04/09/19 0838  TSH 8.316*  --   FREET4  --  0.86   Anemia Panel: No results for input(s): VITAMINB12, FOLATE, FERRITIN, TIBC, IRON, RETICCTPCT in the last 72 hours. Sepsis Labs: No results for input(s): PROCALCITON, LATICACIDVEN in the last 168 hours.  Recent Results (from the past 240 hour(s))  Surgical pcr screen     Status: None   Collection Time: 04/04/19  8:22 PM   Specimen: Nasal Mucosa; Nasal Swab  Result Value Ref Range Status   MRSA, PCR NEGATIVE NEGATIVE Final   Staphylococcus aureus NEGATIVE NEGATIVE Final    Comment: (NOTE) The Xpert SA Assay (FDA approved for NASAL specimens in patients 18 years of age and older), is one component of a comprehensive surveillance program. It is not intended to diagnose infection nor to guide or monitor treatment. Performed at [facility], [place] [address], [place], [place] [address]    Culture, blood (routine x 2)     Status: None (Preliminary result)   Collection Time: 04/08/19  4:12 PM   Specimen: BLOOD  Result Value Ref Range Status   Specimen Description BLOOD RIGHT ANTECUBITAL  Final   Special Requests   Final    BOTTLES DRAWN AEROBIC AND ANAEROBIC Blood Culture adequate volume   Culture   Final    NO GROWTH 2 DAYS Performed at [facility], [place] [address], [place], [place] [address]  Report Status PENDING  Incomplete  Culture, blood (routine x 2)     Status: None (Preliminary result)   Collection Time: 04/08/19  4:12 PM   Specimen: BLOOD RIGHT HAND  Result Value Ref Range Status   Specimen Description BLOOD RIGHT HAND  Final   Special Requests   Final    BOTTLES DRAWN AEROBIC AND ANAEROBIC Blood Culture adequate volume   Culture   Final    NO GROWTH 2 DAYS Performed at [facility], [address] [place], [place] [address]    Report Status PENDING  Incomplete     RN Pressure Injury Documentation:     Estimated body mass index is 28.4 kg/m as calculated from the following:   Height as of 03/20/19: 6' (1.829 m).   Weight as of this encounter: 95 kg.  Malnutrition Type:  Nutrition Problem: Inadequate oral intake Etiology: inability to eat   Malnutrition Characteristics:  Signs/Symptoms: NPO status   Nutrition Interventions:  Interventions: Refer to RD note for recommendations  Radiology Studies: No results found. Scheduled Meds: . sodium chloride   Intravenous Once  . atorvastatin  80 mg Oral Daily  . chlorhexidine  15 mL Mouth Rinse BID  . Chlorhexidine Gluconate Cloth  6 each Topical Daily  . clopidogrel  75 mg Oral Daily  . darbepoetin (ARANESP) injection - DIALYSIS  100 mcg Intravenous Q Sat-HD  . feeding supplement (PRO-STAT SUGAR FREE 64)  30 mL Oral BID  . gentamicin cream  1 application Topical Daily  . insulin aspart  0-15 Units Subcutaneous TID WC  . levETIRAcetam  500 mg Oral BID  . levETIRAcetam  500 mg  Oral Q T,Th,Sa-HD  . levothyroxine  200 mcg Oral q morning - 10a  . multivitamin  1 tablet Oral QHS  . naloxone  0.4 mg Intramuscular Once  . pantoprazole  40 mg Oral BID  . traZODone  50 mg Oral QHS   Continuous Infusions:   LOS: 7 days   Kerney Elbe, DO [facility] PAGER is on AMION  If 7PM-7AM, please contact night-coverage [email or web address]

## 2019-04-11 NOTE — Procedures (Signed)
Patient seen on Hemodialysis- resting comfortably. BP (!) 172/69 (BP Location: Right Arm)   Pulse 77   Temp 97.6 F (36.4 C) (Oral)   Resp 14   Wt 95 kg   SpO2 100%   BMI 28.40 kg/m   QB 400, UF goal 2.5L Tolerating treatment without complaints at this time.   Elmarie Shiley MD [facility]. Office # [phone] Pager # [phone] 9:34 AM

## 2019-04-12 LAB — COMPREHENSIVE METABOLIC PANEL
ALT: 12 U/L (ref 0–44)
AST: 18 U/L (ref 15–41)
Albumin: 2.1 g/dL — ABNORMAL LOW (ref 3.5–5.0)
Alkaline Phosphatase: 60 U/L (ref 38–126)
Anion gap: 11 (ref 5–15)
BUN: 12 mg/dL (ref 8–23)
CO2: 24 mmol/L (ref 22–32)
Calcium: 6.9 mg/dL — ABNORMAL LOW (ref 8.9–10.3)
Chloride: 103 mmol/L (ref 98–111)
Creatinine, Ser: 4.02 mg/dL — ABNORMAL HIGH (ref 0.61–1.24)
GFR calc Af Amer: 16 mL/min — ABNORMAL LOW (ref >60)
GFR calc non Af Amer: 14 mL/min — ABNORMAL LOW (ref >60)
Glucose, Bld: 136 mg/dL — ABNORMAL HIGH (ref 70–99)
Potassium: 4 mmol/L (ref 3.5–5.1)
Sodium: 138 mmol/L (ref 135–145)
Total Bilirubin: 0.7 mg/dL (ref 0.3–1.2)
Total Protein: 4.6 g/dL — ABNORMAL LOW (ref 6.5–8.1)

## 2019-04-12 LAB — MAGNESIUM: Magnesium: 1.6 mg/dL — ABNORMAL LOW (ref 1.7–2.4)

## 2019-04-12 LAB — CBC WITH DIFFERENTIAL/PLATELET
Abs Immature Granulocytes: 0.05 10*3/uL (ref 0.00–0.07)
Basophils Absolute: 0.1 10*3/uL (ref 0.0–0.1)
Basophils Relative: 1 %
Eosinophils Absolute: 0.2 10*3/uL (ref 0.0–0.5)
Eosinophils Relative: 2 %
HCT: 25.7 % — ABNORMAL LOW (ref 39.0–52.0)
Hemoglobin: 8.6 g/dL — ABNORMAL LOW (ref 13.0–17.0)
Immature Granulocytes: 1 %
Lymphocytes Relative: 17 %
Lymphs Abs: 1.3 10*3/uL (ref 0.7–4.0)
MCH: 31.3 pg (ref 26.0–34.0)
MCHC: 33.5 g/dL (ref 30.0–36.0)
MCV: 93.5 fL (ref 80.0–100.0)
Monocytes Absolute: 0.5 10*3/uL (ref 0.1–1.0)
Monocytes Relative: 7 %
Neutro Abs: 5.4 10*3/uL (ref 1.7–7.7)
Neutrophils Relative %: 72 %
Platelets: 190 10*3/uL (ref 150–400)
RBC: 2.75 MIL/uL — ABNORMAL LOW (ref 4.22–5.81)
RDW: 15.4 % (ref 11.5–15.5)
WBC: 7.4 10*3/uL (ref 4.0–10.5)
nRBC: 0 % (ref 0.0–0.2)

## 2019-04-12 LAB — GLUCOSE, CAPILLARY
Glucose-Capillary: 121 mg/dL — ABNORMAL HIGH (ref 70–99)
Glucose-Capillary: 192 mg/dL — ABNORMAL HIGH (ref 70–99)
Glucose-Capillary: 166 mg/dL — ABNORMAL HIGH (ref 70–99)
Glucose-Capillary: 216 mg/dL — ABNORMAL HIGH (ref 70–99)

## 2019-04-12 LAB — PHOSPHORUS: Phosphorus: 1.8 mg/dL — ABNORMAL LOW (ref 2.5–4.6)

## 2019-04-12 MED ORDER — LOSARTAN POTASSIUM 50 MG PO TABS
50.0000 mg | ORAL_TABLET | Freq: Every day | ORAL | Status: DC
  Administered 2019-04-12 – 2019-04-16 (×5): 50 mg via ORAL
  Filled 2019-04-12 (×5): qty 1

## 2019-04-12 MED ORDER — K PHOS MONO-SOD PHOS DI & MONO 155-852-130 MG PO TABS
500.0000 mg | ORAL_TABLET | Freq: Two times a day (BID) | ORAL | Status: AC
  Administered 2019-04-12 (×2): 500 mg via ORAL
  Filled 2019-04-12 (×2): qty 2

## 2019-04-12 MED ORDER — MAGNESIUM SULFATE IN D5W 1-5 GM/100ML-% IV SOLN
1.0000 g | Freq: Once | INTRAVENOUS | Status: AC
  Administered 2019-04-12: 1 g via INTRAVENOUS
  Filled 2019-04-12: qty 100

## 2019-04-12 MED ORDER — CALCIUM CARBONATE ANTACID 500 MG PO CHEW
400.0000 mg | CHEWABLE_TABLET | Freq: Every day | ORAL | Status: DC
  Administered 2019-04-12 – 2019-04-15 (×4): 400 mg via ORAL
  Filled 2019-04-12 (×4): qty 2

## 2019-04-12 MED ORDER — NEPRO/CARBSTEADY PO LIQD
237.0000 mL | Freq: Two times a day (BID) | ORAL | Status: DC
  Administered 2019-04-12 – 2019-04-16 (×5): 237 mL via ORAL

## 2019-04-12 MED ORDER — K PHOS MONO-SOD PHOS DI & MONO 155-852-130 MG PO TABS
500.0000 mg | ORAL_TABLET | Freq: Once | ORAL | Status: DC
  Filled 2019-04-12: qty 2

## 2019-04-12 MED ORDER — PANCRELIPASE (LIP-PROT-AMYL) 12000-38000 UNITS PO CPEP
24000.0000 [IU] | ORAL_CAPSULE | Freq: Three times a day (TID) | ORAL | Status: DC
  Administered 2019-04-12 – 2019-04-16 (×12): 24000 [IU] via ORAL
  Filled 2019-04-12 (×12): qty 2

## 2019-04-12 NOTE — Progress Notes (Signed)
PROGRESS NOTE    Kyle [name]  KDX:[id number] DOB: 21-Apr-1950 DOA: 04/04/2019 PCP: Idelle Crouch, MD   Brief Narrative:  The patient is a 69 year old Caucasian Male who was Patient was discharged from [facility] on 1/21 to CIR at [facility] after being treated for acute left midbrain stroke. He subsequently developed GI bleed(acute onset of black and red stools X 5 overnight and also vomited coffee grounds one timewhile at rehab)and acute blood loss anemia and was transferred from Rehab and admitted to [facility] on 2/6. He has ESRD and is being dialyzed during his hospitalization Tuesdays Thursdays and Saturdays. Nephrology and GI following: And the plan was for colonoscopy 04/08/19 however patient refused prep and has been refusing labwork and intervention and became combative and rude to staff. Because of this we consulted Neurology, Psychiatry, and repeat an MRI of the brain and EEG.   Patient is now improved more cooperative and had his bowel prep and having bowel movements. Underwent colonoscopy which showed a single nonbleeding colonic angiodysplastic lesion that was treated with a monopolar probe and as well as diverticulosis in the entire examined colon and medium-sized lipoma in the transverse colon.  GI recommended switching the patient back to his Plavix and will continue the PPI and change to p.o. an outpatient diet has been advanced.  Currently patient is still awaiting outpatient placement in dialysis unit and he was dialyzed yesterday   Assessment & Plan:   Principal Problem:   GI bleed Active Problems:   Chronic obstructive pulmonary disease (HCC)   Diabetes mellitus (HCC)   Insulin dependent type 2 diabetes mellitus ()   ESRD (end stage renal disease) ([place])   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[place])   Acute upper GI bleed  GI bleed/Acute blood loss anemia in the setting of single colonic angiodysplastic lesion superimposed on chronic anemia of kidney disease -S/p EGDno bleeding  source find in EGDwith duodenitis and gastritis, patient has a colonoscopy in 11/2018 "adenomatous polyps and a tubulovillous adenoma were removed and extensive diverticulosis was noted" -He denies Abdominal Pain  -S/p Transfusion of 2 units of pRBC's, and hemoglobin/hematocrit had improved to 9.2/27.8. Today it remains relatively stable at 8.6/25.7 -Continue to Monitor for Melanotic Stools. Patient states he is no longer having them  -GI recommend continue PPIdrip through 04/07/19 then transition to IV BID PPI and have now changed to p.o. PPI -Colonoscopy done 04/09/2019 showed "A single non-bleeding colonic angiodysplastic lesion. Treated with a monopolar probe. Diverticulosis in the entire examined colon. Medium-sized lipoma in the transverse colon."  -Patient is again calmer today and pleasant and has had no behavioral issues; He states he did not sleep well last night  HTN -Currently was 157/71 this AM -Resumed Amlodipine 2.5 mg po Daily and Increase Metoprolol back to Home dose  -Cozaar is still being held but will resume this AM at half the dose of 50 mg po Daily   CVA withdifficulty with balance and ptosis of left eyelid; CVA from small vessel disease -He was on asa/plavix for three wks then asa alone but now changed to just Plavix per the most recent Neurology recommendation (Could do Either ASA or Plavix) -Mental status improved yesterday but, checking CT head after further discussion with wife behavior yesterday was not normal -Repeat Head CT showed "No acute abnormality. Expected evolution of the previously demonstrated left paramedian midbrain infarction. 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." -Now he remains agitated and combative and refusing  interventions -Repeated Brain MRI w/o Contrast and Consult Psych for behavioral issues and Capacity Evaluation and have also asked neurology to come back; patient does have the mental capacity  make his own decisions and neurology feels that symptoms were related to delirium  -Held ASA and Plavix in the setting of gi bleed initially but will not continue DAPT; -ContinueAtorvaStatin 80 mg po Daily  -Repeat MRI as below and EEG normal  -PT/OT recommending SNF and awaiting bed placement  Seizure Disorder  -On Keppra chronically -Continued Levitracetam 500 mg IV q12h and receives an extra dose with dialysis and now this is po 500 mg  -Check EEG head was normal as below  Insulin DependentDM2 -He was discharged on insulin (lantus and ssi) from recent hospitalization -CBGs ranging from 121-237 -Continue with Moderate NovoLog/scale insulin before meals and at bedtime  AMS/Encephalopathy, likely metabolic, improving likely was some hospital delirium, much improved -Patient is alert and oriented however wife states that this is a total change from his baseline; Today he is more appropriate and pleasant and cooperative  -Check blood cultures x2 and Pending, urinalysis and urine culture as well as chest x-ray  -Patient's PD fluid was negative for infection -Checked head MRI and showed "No acute intracranial abnormality. Expected evolution of midbrain infarct since 03/15/2019. 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 No seizures or epileptiform discharges were seen throughout the recording." -Neurology consulted for further evaluation; neurology recommends continuing Keppra and given additional dose after dialysis and recommend reinitiating either aspirin or Plavix when safe and reducing trazodone 50 mg p.o. nightly and suspects that the patient has had some delirium -Psychiatric consulted and patient does have mental capacity to make informed decisions and would benefit from being given in-depth information regards to his procedures and treatments -Appears more pleasant and oriented and appears  back to baseline  ESRD was on peritoneal dialysis transitioned to HD on 2/2 Metabolic Acidosis, mild  -Status post RIJ TDC and left BCAVF placed by Dr. Carlis Abbott 2/1. -Nephrology consulted for further evaluation; patient has a right upper extremity AV fistula that was placed on 03/30/2019 -Received HD 04/07/19, 04/09/19 and again will get it today  -Discussed with Nephrology and they will obtain a PD sample to r/o Infection  -Patient's BUN/Cr went from 46/5.78 -> 53/7.08 -> 21/4.80 -> 22/5.50 -> 8/3.32 -> 16/5.42 -> 12/4.02 today  -Currently still awaiting outpatient HD placement and Nephrology stated might be a little difficult since he dialyzes at [place];  -Patient went to Dialysis yesterday  -Other nephrotoxic medications, contrast dyes, hypotension and renally adjust medication  -Continue monitor trend renal function repeat CMP in a.m.  COPD -Not home O2 dependent, -No wheezing on exam, no hypoxia  Chronic RBBB -EKG stable  Hypothyroidism -Continue Levothyroxine 200 mcg po Daily but may need to change to IV since he has been refusing -Checked TSH and was 8.316 and Free T4 was 0.86  -Repeat TFT's in 4-6 weeks as an outpatient   Thrombocytopenia -In the setting of his bleeding and now improved -Patient's platelet count now is 190,000 -Continue to monitor for signs and symptoms of bleeding -Repeat CBC in a.m.  Hypomagnesemia  -Patient's magnesium level this morning was 1.6 -Replete with IV Mag Sulfate 1 gram -Continue to monitor and replete as necessary -Repeat Mag Level in the AM   Hypophosphatemia  -Patient's phosphorus level was 1.8 -Replete with po KPhos Neutral 500 mg po  BID x2 -Continue monitor and Replete as Necessary  -Repeat CMP in a.m.  Hypokalemia  -Patient's potassium this morning was 4.0 -Continue to Monitor and Replete as Necessary -Repeat CMP in AM   Insomnia -States he could not sleep last night -Will give Benadryl 25 mg qHS  DVT prophylaxis: SCDs  and Heparin 5,000 units sq q8h  Code Status: FULL CODE Family Communication: No family at bedside but updated updated wife via the telephone yesteday Disposition Plan: Patient is from CIR and had a GI bleed; Required inpatient hospitalization for colonoscopy and further workup; He is not combative now and is improving and will need to ensure his hemoglobin/hematocrit is stable prior to safely D/C'ing back to CIR vs. SNF; CIR cannot accept the patient back so he will need SNF and will need to CLIP to a Dialysis Center but this is currently still pending to be done. He is now Medically stable to D/C    Consultants:   Gastroenterology  Nephrology  Neurology  Psychiatry   Procedures:  EGD on 04/05/2019 Findings:      The examined esophagus was normal.      The Z-line was regular and was found 42 cm from the incisors.      Localized moderate inflammation characterized by congestion (edema),       erosions and erythema was found in the prepyloric region of the stomach.      The cardia and gastric fundus were normal on retroflexion.      Patchy severe mucosal changes characterized by congestion, erythema,       erosion and nodularity were found in the duodenal bulb. Biopsies were       taken with a cold forceps for histology. Estimated blood loss was       minimal.      The second portion of the duodenum was normal. Impression:               - Normal esophagus.                           - Z-line regular, 42 cm from the incisors.                           - Gastritis.                           - Mucosal changes in the duodenum. Biopsied.                           - Normal second portion of the duodenum.  COLONOSCOPY 04/09/2019 Findings:      A single small localized angiodysplastic lesion without bleeding was       found in the transverse colon. Coagulation for tissue destruction using       monopolar probe was successful.      Scattered small and large-mouthed diverticula were found in  the entire       colon.      There was a medium-sized lipoma, in the transverse colon.      Scattered diverticula were noted throughout the colon, but the highest       concentration was in the left side of the colon. A small nonbleeding AVM       was ablated with APC. There was evidence of bleeding in the cecum, but  with closer examination, this was consistent with barotrauma. Impression:               - A single non-bleeding colonic angiodysplastic                            lesion. Treated with a monopolar probe.                           - Diverticulosis in the entire examined colon.                           - Medium-sized lipoma in the transverse colon.                           - No specimens     Antimicrobials:  Anti-infectives (From admission, onward)   None     Subjective: Seen and examined at bedside and he was resting in bed and states he did not have too good of a night because he couldn't sleep and has been awake since 2:30 this AM. No CP, SOB, Nausea or vomiting. Discussed with him about discharge planning and he states Social Work is to call his wife and find out options soon. No other concerns or complaints at this time.   Objective: Vitals:   04/11/19 1701 04/11/19 1931 04/12/19 0025 04/12/19 0418  BP: (!) 154/67 136/60 (!) 147/66 (!) 157/71  Pulse:  74 71 65  Resp: 18 14 15 17   Temp: 98.5 F (36.9 C) 98.3 F (36.8 C) 98.1 F (36.7 C) 98.7 F (37.1 C)  TempSrc: Oral Oral Oral Oral  SpO2:  100% 100% 100%  Weight:        Intake/Output Summary (Last 24 hours) at 04/12/2019 4854 Last data filed at 04/11/2019 2209 Gross per 24 hour  Intake 360 ml  Output 250 ml  Net 110 ml   Filed Weights   04/09/19 1359 04/09/19 1623 04/09/19 2000  Weight: 96.3 kg 96.5 kg 95 kg   Examination: Physical Exam:  Constitutional: WN/WD overweight Caucasian male in NAD and appears calm and comfortable Eyes: Left Eyelid is closed and has Ptosis from his CVA; Right eye  appeared normal and scleare anicteric ENMT: External Ears, Nose appear normal. Grossly normal hearing. Neck: Appears normal, supple, no cervical masses, normal ROM, no appreciable thyromegaly; no JVD Respiratory: Clear to auscultation bilaterally, no wheezing, rales, rhonchi or crackles. Normal respiratory effort and patient is not tachypenic. No accessory muscle use. Unlabored breathing and not wearing any supplemental O2 via [place] Cardiovascular: RRR, no murmurs / rubs / gallops. S1 and S2 auscultated. No extremity edema Abdomen: Soft, non-tender, Distended slightly 2/2 to body habitus. Bowel sounds positive x4.  GU: Deferred. Musculoskeletal: No clubbing / cyanosis of digits/nails. No joint deformity upper and lower extremities. Skin: No rashes, lesions, ulcers on a limited skin evaluation. No induration; Warm and dry.  Neurologic: CN 2-12 grossly intact with no focal deficits except the Left eye drooping. Romberg sign cerebellar and reflexes not assessed.  Psychiatric: Normal judgment and insight. Alert and oriented x 3. Pleasant mood and appropriate affect.   Data Reviewed: I have personally reviewed following labs and imaging studies  CBC: Recent Labs  Lab 04/08/19 1612 04/09/19 0407 04/10/19 0229 04/11/19 0526 04/12/19 0436  WBC 6.1 10.9* 6.8 6.7 7.4  NEUTROABS 4.7 9.1* 5.2  4.7 5.4  HGB 8.8* 9.2* 8.8* 8.6* 8.6*  HCT 26.0* 27.8* 26.6* 26.2* 25.7*  MCV 92.9 93.0 92.4 94.6 93.5  PLT 150 179 183 192 161   Basic Metabolic Panel: Recent Labs  Lab 04/08/19 1612 04/09/19 0407 04/10/19 0229 04/11/19 0526 04/12/19 0436  NA 140 140 137 139 138  K 4.0 4.2 3.4* 3.6 4.0  CL 101 104 102 105 103  CO2 24 23 26 25 24   GLUCOSE 124* 112* 179* 135* 136*  BUN 21 22 8 16 12   CREATININE 4.80* 5.50* 3.32* 5.42* 4.02*  CALCIUM 7.3* 7.4* 7.3* 7.3* 6.9*  MG 1.3* 1.4* 1.4* 1.9 1.6*  PHOS 2.8 2.9 2.1* 2.9 1.8*   GFR: Estimated Creatinine Clearance: 20.5 mL/min (A) (by C-G formula based on SCr  of 4.02 mg/dL (H)). Liver Function Tests: Recent Labs  Lab 04/08/19 1612 04/09/19 0407 04/10/19 0229 04/11/19 0526 04/12/19 0436  AST 22 21 19 17 18   ALT 13 15 13 13 12   ALKPHOS 66 68 68 59 60  BILITOT 0.9 0.9 0.6 0.6 0.7  PROT 4.5* 4.7* 4.6* 4.7* 4.6*  ALBUMIN 2.0* 2.2* 2.1* 2.1* 2.1*   No results for input(s): LIPASE, AMYLASE in the last 168 hours. No results for input(s): AMMONIA in the last 168 hours. Coagulation Profile: No results for input(s): INR, PROTIME in the last 168 hours. Cardiac Enzymes: No results for input(s): CKTOTAL, CKMB, CKMBINDEX, TROPONINI in the last 168 hours. BNP (last 3 results) No results for input(s): PROBNP in the last 8760 hours. HbA1C: No results for input(s): HGBA1C in the last 72 hours. CBG: Recent Labs  Lab 04/11/19 1244 04/11/19 1631 04/11/19 1731 04/11/19 2129 04/12/19 0753  GLUCAP 109* 216* 237* 203* 121*   Lipid Profile: No results for input(s): CHOL, HDL, LDLCALC, TRIG, CHOLHDL, LDLDIRECT in the last 72 hours. Thyroid Function Tests: Recent Labs    04/09/19 0838  FREET4 0.86   Anemia Panel: No results for input(s): VITAMINB12, FOLATE, FERRITIN, TIBC, IRON, RETICCTPCT in the last 72 hours. Sepsis Labs: No results for input(s): PROCALCITON, LATICACIDVEN in the last 168 hours.  Recent Results (from the past 240 hour(s))  Surgical pcr screen     Status: None   Collection Time: 04/04/19  8:22 PM   Specimen: Nasal Mucosa; Nasal Swab  Result Value Ref Range Status   MRSA, PCR NEGATIVE NEGATIVE Final   Staphylococcus aureus NEGATIVE NEGATIVE Final    Comment: (NOTE) The Xpert SA Assay (FDA approved for NASAL specimens in patients 7 years of age and older), is one component of a comprehensive surveillance program. It is not intended to diagnose infection nor to guide or monitor treatment. Performed at [facility], [place] [address], [place], [place] [address]   Culture, blood (routine x 2)     Status: None  (Preliminary result)   Collection Time: 04/08/19  4:12 PM   Specimen: BLOOD  Result Value Ref Range Status   Specimen Description BLOOD RIGHT ANTECUBITAL  Final   Special Requests   Final    BOTTLES DRAWN AEROBIC AND ANAEROBIC Blood Culture adequate volume   Culture   Final    NO GROWTH 3 DAYS Performed at [facility], [place] [address] [place],  [address]    Report Status PENDING  Incomplete  Culture, blood (routine x 2)     Status: None (Preliminary result)   Collection Time: 04/08/19  4:12 PM   Specimen: BLOOD RIGHT HAND  Result Value Ref Range Status  Specimen Description BLOOD RIGHT HAND  Final   Special Requests   Final    BOTTLES DRAWN AEROBIC AND ANAEROBIC Blood Culture adequate volume   Culture   Final    NO GROWTH 3 DAYS Performed at [facility], [address] [place], [place] [address]    Report Status PENDING  Incomplete     RN Pressure Injury Documentation:     Estimated body mass index is 28.4 kg/m as calculated from the following:   Height as of 03/20/19: 6' (1.829 m).   Weight as of this encounter: 95 kg.  Malnutrition Type:  Nutrition Problem: Inadequate oral intake Etiology: inability to eat   Malnutrition Characteristics:  Signs/Symptoms: NPO status   Nutrition Interventions:  Interventions: Refer to RD note for recommendations  Radiology Studies: No results found. Scheduled Meds: . sodium chloride   Intravenous Once  . amLODipine  2.5 mg Oral Daily  . atorvastatin  80 mg Oral Daily  . chlorhexidine  15 mL Mouth Rinse BID  . Chlorhexidine Gluconate Cloth  6 each Topical Daily  . clopidogrel  75 mg Oral Daily  . darbepoetin (ARANESP) injection - DIALYSIS  100 mcg Intravenous Q Sat-HD  . feeding supplement (PRO-STAT SUGAR FREE 64)  30 mL Oral BID  . gentamicin cream  1 application Topical Daily  . heparin injection (subcutaneous)  5,000 Units Subcutaneous Q8H  . insulin aspart  0-15 Units Subcutaneous TID WC  .  levETIRAcetam  500 mg Oral BID  . levETIRAcetam  500 mg Oral Q T,Th,Sa-HD  . levothyroxine  200 mcg Oral q morning - 10a  . metoprolol succinate  50 mg Oral Daily  . multivitamin  1 tablet Oral QHS  . naloxone  0.4 mg Intramuscular Once  . pantoprazole  40 mg Oral BID  . phosphorus  500 mg Oral Once  . traZODone  50 mg Oral QHS   Continuous Infusions: . magnesium sulfate bolus IVPB      LOS: 8 days   Kerney Elbe, DO [facility] PAGER is on [place]  If 7PM-7AM, please contact night-coverage [email or web address]

## 2019-04-12 NOTE — Progress Notes (Signed)
[facility] Progress Note    Assessment/ Plan:    ESRD (TTS): Transitioned from PD to HD on 2/2, last session 2/11.  RUE AVF placed on 2/1. S/p HD yesterday without complication. Awaiting outpatient HD placement. Electrolytes stable overnight.    Anemia of CKD   BL Anemia 2/2 angiodysplastic lesion: Hgb stable at 8.6 this AM. S/p 4 pRBCs and coagulation of lesion on 2/11 per GI. Receiving Aranesp weekly.   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 Corrected Ca 8.5 this AM. Not currently on any phos binders.  Calcium carbonate qHS   K-phos BID x 2 doses  Switch from Renal diet to Heart healthy diet to allow for increased phos intake    Hypomagnesemia: Mag 1.6 this AM.   1g IV mag this AM  Consider PO MagOx 400mg  BID if continues to be low   continue to monitor   Hypertension: Elevated, SBP 140-150s overnight. Current meds include: Metoprolol 50mg  QD, restarted Norvasc 2.5mg  on 2/13.   Continue to monitor   Intermittent Agitation: Improving, suspected delirium. EEG and MRI wnl. Plan per primary and neurology.    Nutrition: Albumin 2.1.  Heart healthy diet with fluid restriction  MTVI  Nepro carb, Pro-stat    Acute Pontine Stroke: Continued rehabilitation. On Plavix.   T2DM  Hypothyroidism: Per primary.     Dispo: SNF  Subjective:   Patient doing well this AM. No other concerns or complaints. NO acute events overnight. Resting comfortably in bed this AM as he notes he didn't sleep well.   Objective:   BP (!) 157/71 (BP Location: Right Arm)   Pulse 65   Temp 98.7 F (37.1 C) (Oral)   Resp 17   Wt 95 kg   SpO2 100%   BMI 28.40 kg/m   Intake/Output Summary (Last 24 hours) at 04/12/2019 3546 Last data filed at 04/11/2019 2209 Gross per 24 hour  Intake 360 ml  Output 250 ml  Net 110 ml   Weight change:   Physical Exam: General: pleasant older male, resting comfortably in bed, in no acute distress with non-toxic appearance CV: regular rate and rhythm  without murmurs, rubs, or gallops, no lower extremity edema Lungs: clear to auscultation bilaterally with normal work of breathing Abdomen: soft, non-tender, non-distended Skin: warm, dry Extremities: warm and well perfused Access: Right IJ tunnel catheter with LUE aVF with palpable thrill and audible bruit    Imaging: No results found.  Labs: BMET Recent Labs  Lab 04/06/19 5681 04/07/19 1647 04/08/19 1612 04/09/19 0407 04/10/19 0229 04/11/19 0526 04/12/19 0436  NA 138 136 140 140 137 139 138  K 4.3 4.2 4.0 4.2 3.4* 3.6 4.0  CL 106 107 101 104 102 105 103  CO2 22 20* 24 23 26 25 24   GLUCOSE 104* 176* 124* 112* 179* 135* 136*  BUN 46* 53* 21 22 8 16 12   CREATININE 5.78* 7.08* 4.80* 5.50* 3.32* 5.42* 4.02*  CALCIUM 6.6* 6.3* 7.3* 7.4* 7.3* 7.3* 6.9*  PHOS  --  3.0 2.8 2.9 2.1* 2.9 1.8*   CBC Recent Labs  Lab 04/09/19 0407 04/10/19 0229 04/11/19 0526 04/12/19 0436  WBC 10.9* 6.8 6.7 7.4  NEUTROABS 9.1* 5.2 4.7 5.4  HGB 9.2* 8.8* 8.6* 8.6*  HCT 27.8* 26.6* 26.2* 25.7*  MCV 93.0 92.4 94.6 93.5  PLT 179 183 192 190    Medications:    . sodium chloride   Intravenous Once  . amLODipine  2.5 mg Oral Daily  . atorvastatin  80 mg  Oral Daily  . chlorhexidine  15 mL Mouth Rinse BID  . Chlorhexidine Gluconate Cloth  6 each Topical Daily  . clopidogrel  75 mg Oral Daily  . darbepoetin (ARANESP) injection - DIALYSIS  100 mcg Intravenous Q Sat-HD  . feeding supplement (NEPRO CARB STEADY)  237 mL Oral BID BM  . feeding supplement (PRO-STAT SUGAR FREE 64)  30 mL Oral BID  . gentamicin cream  1 application Topical Daily  . heparin injection (subcutaneous)  5,000 Units Subcutaneous Q8H  . insulin aspart  0-15 Units Subcutaneous TID WC  . levETIRAcetam  500 mg Oral BID  . levETIRAcetam  500 mg Oral Q T,Th,Sa-HD  . levothyroxine  200 mcg Oral q morning - 10a  . losartan  50 mg Oral Daily  . metoprolol succinate  50 mg Oral Daily  . multivitamin  1 tablet Oral QHS  .  naloxone  0.4 mg Intramuscular Once  . Pancrelipase (Lip-Prot-Amyl)  1 capsule Oral TID  . pantoprazole  40 mg Oral BID  . phosphorus  500 mg Oral BID  . traZODone  50 mg Oral QHS    Mina Marble, DO [facility] Resident, PGY2 04/12/2019, 8:28 AM

## 2019-04-13 LAB — CBC WITH DIFFERENTIAL/PLATELET
Abs Immature Granulocytes: 0.05 10*3/uL (ref 0.00–0.07)
Basophils Absolute: 0.1 10*3/uL (ref 0.0–0.1)
Basophils Relative: 1 %
Eosinophils Absolute: 0.2 10*3/uL (ref 0.0–0.5)
Eosinophils Relative: 2 %
HCT: 24.3 % — ABNORMAL LOW (ref 39.0–52.0)
Hemoglobin: 8.1 g/dL — ABNORMAL LOW (ref 13.0–17.0)
Immature Granulocytes: 1 %
Lymphocytes Relative: 16 %
Lymphs Abs: 1.2 10*3/uL (ref 0.7–4.0)
MCH: 31 pg (ref 26.0–34.0)
MCHC: 33.3 g/dL (ref 30.0–36.0)
MCV: 93.1 fL (ref 80.0–100.0)
Monocytes Absolute: 0.4 10*3/uL (ref 0.1–1.0)
Monocytes Relative: 6 %
Neutro Abs: 5.7 10*3/uL (ref 1.7–7.7)
Neutrophils Relative %: 74 %
Platelets: 206 10*3/uL (ref 150–400)
RBC: 2.61 MIL/uL — ABNORMAL LOW (ref 4.22–5.81)
RDW: 15.3 % (ref 11.5–15.5)
WBC: 7.6 10*3/uL (ref 4.0–10.5)
nRBC: 0 % (ref 0.0–0.2)

## 2019-04-13 LAB — CULTURE, BLOOD (ROUTINE X 2)
Culture: NO GROWTH
Culture: NO GROWTH
Special Requests: ADEQUATE
Special Requests: ADEQUATE

## 2019-04-13 LAB — COMPREHENSIVE METABOLIC PANEL
ALT: 11 U/L (ref 0–44)
AST: 17 U/L (ref 15–41)
Albumin: 2 g/dL — ABNORMAL LOW (ref 3.5–5.0)
Alkaline Phosphatase: 64 U/L (ref 38–126)
Anion gap: 12 (ref 5–15)
BUN: 23 mg/dL (ref 8–23)
CO2: 25 mmol/L (ref 22–32)
Calcium: 6.7 mg/dL — ABNORMAL LOW (ref 8.9–10.3)
Chloride: 102 mmol/L (ref 98–111)
Creatinine, Ser: 5.82 mg/dL — ABNORMAL HIGH (ref 0.61–1.24)
GFR calc Af Amer: 10 mL/min — ABNORMAL LOW (ref >60)
GFR calc non Af Amer: 9 mL/min — ABNORMAL LOW (ref >60)
Glucose, Bld: 126 mg/dL — ABNORMAL HIGH (ref 70–99)
Potassium: 3.8 mmol/L (ref 3.5–5.1)
Sodium: 139 mmol/L (ref 135–145)
Total Bilirubin: 0.5 mg/dL (ref 0.3–1.2)
Total Protein: 4.3 g/dL — ABNORMAL LOW (ref 6.5–8.1)

## 2019-04-13 LAB — GLUCOSE, CAPILLARY
Glucose-Capillary: 134 mg/dL — ABNORMAL HIGH (ref 70–99)
Glucose-Capillary: 153 mg/dL — ABNORMAL HIGH (ref 70–99)
Glucose-Capillary: 220 mg/dL — ABNORMAL HIGH (ref 70–99)
Glucose-Capillary: 266 mg/dL — ABNORMAL HIGH (ref 70–99)

## 2019-04-13 LAB — MAGNESIUM: Magnesium: 1.9 mg/dL (ref 1.7–2.4)

## 2019-04-13 LAB — PHOSPHORUS: Phosphorus: 3.5 mg/dL (ref 2.5–4.6)

## 2019-04-13 MED ORDER — DIPHENHYDRAMINE HCL 25 MG PO CAPS: 25.0000 mg | ORAL_CAPSULE | Freq: Every evening | ORAL | Status: DC | PRN

## 2019-04-13 NOTE — Progress Notes (Addendum)
[facility] Progress Note    Assessment/ Plan:    ESRD (TTS): Transitioned from PD to HD on 2/2, last session 2/13.  Plan for HD tomorrow, 2/16.  RUE AVF placed on 2/1. Awaiting outpatient HD placement.    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 S/p 4 pRBCs and coagulation of lesion on 2/11 per GI. Receiving Aranesp weekly.   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 Ca 8.3 corrected. Cont Calcium carbonate qhs and heart healthy diet.    Hypomagnesemia: Resolved, 1.9 this am s/p IV Mg. Can consider MagOx 400mg  BID if recurrently low.     Hypertension: Stable, SBP 120-140's. Cont Metoprolol, losartan, and Norvasc as is.   Intermittent Agitation: Improving, suspected delirium. EEG and MRI wnl. Plan per primary and neurology.    Nutrition: Albumin 2.1. MTVI + Nepro supplements.    Acute [place] Stroke: Continued rehabilitation. On Plavix.   T2DM  Hypothyroidism: Per primary.    Dispo: SNF when medically appropriate awaiting arrangements for outpatient dialysis until a SNF is identified.  Subjective:   No acute events overnight.  Doing well this morning, eating breakfast.  No complaints.   Objective:   BP 133/70 (BP Location: Right Arm)   Pulse 64   Temp 98 F (36.7 C) (Axillary)   Resp 12   Wt 95 kg   SpO2 97%   BMI 28.40 kg/m   Intake/Output Summary (Last 24 hours) at 04/13/2019 0813 Last data filed at 04/12/2019 1900 Gross per 24 hour  Intake 960 ml  Output 0 ml  Net 960 ml   Weight change:   Physical Exam: General: Pleasant older gentleman, in no acute distress, sitting up eating breakfast Cardiac: Regular rate and rhythm Lungs: Clear to auscultation bilaterally, unlabored breathing Abdomen: Soft, nontender Extremities: No pitting edema bilaterally Access: Right IJ tunnel catheter with LUE aVF with palpable thrill and audible bruit   Imaging: No results  found.  Labs: BMET Recent Labs  Lab 04/07/19 1647 04/08/19 1612 04/09/19 0407 04/10/19 0229 04/11/19 0526 04/12/19 0436 04/13/19 0620  NA 136 140 140 137 139 138 139  K 4.2 4.0 4.2 3.4* 3.6 4.0 3.8  CL 107 101 104 102 105 103 102  CO2 20* 24 23 26 25 24 25   GLUCOSE 176* 124* 112* 179* 135* 136* 126*  BUN 53* 21 22 8 16 12 23   CREATININE 7.08* 4.80* 5.50* 3.32* 5.42* 4.02* 5.82*  CALCIUM 6.3* 7.3* 7.4* 7.3* 7.3* 6.9* 6.7*  PHOS 3.0 2.8 2.9 2.1* 2.9 1.8* 3.5   CBC Recent Labs  Lab 04/10/19 0229 04/11/19 0526 04/12/19 0436 04/13/19 0620  WBC 6.8 6.7 7.4 7.6  NEUTROABS 5.2 4.7 5.4 5.7  HGB 8.8* 8.6* 8.6* 8.1*  HCT 26.6* 26.2* 25.7* 24.3*  MCV 92.4 94.6 93.5 93.1  PLT 183 192 190 206    Medications:    . sodium chloride   Intravenous Once  . amLODipine  2.5 mg Oral Daily  . atorvastatin  80 mg Oral Daily  . calcium carbonate  400 mg of elemental calcium Oral QHS  . chlorhexidine  15 mL Mouth Rinse BID  . Chlorhexidine Gluconate Cloth  6 each Topical Daily  . clopidogrel  75 mg Oral Daily  . darbepoetin (ARANESP) injection - DIALYSIS  100 mcg Intravenous Q Sat-HD  . feeding supplement (NEPRO CARB STEADY)  237 mL Oral BID BM  . feeding supplement (PRO-STAT SUGAR FREE 64)  30 mL Oral BID  . gentamicin cream  1 application Topical Daily  . heparin injection (subcutaneous)  5,000 Units Subcutaneous Q8H  . insulin aspart  0-15 Units Subcutaneous TID WC  . levETIRAcetam  500 mg Oral BID  . levETIRAcetam  500 mg Oral Q T,Th,Sa-HD  . levothyroxine  200 mcg Oral q morning - 10a  . lipase/protease/amylase  24,000 Units Oral TID  . losartan  50 mg Oral Daily  . metoprolol succinate  50 mg Oral Daily  . multivitamin  1 tablet Oral QHS  . naloxone  0.4 mg Intramuscular Once  . pantoprazole  40 mg Oral BID  . traZODone  50 mg Oral QHS    Patriciaann Clan, DO [facility] Family Medicine Resident, PGY2 04/13/2019, 8:13 AM   I have seen and examined this patient and agree with  plan and assessment in the above note with renal recommendations/intervention highlighted.  Plan for HD on TTS schedule while he remains an inpatient. [place] John A Zyaira Vejar,MD 04/13/2019 3:08 PM

## 2019-04-13 NOTE — Progress Notes (Signed)
Physical Therapy Treatment Patient Details Name: Kyle [name] MRN: [id number] DOB: Jul 19, 1950 Today's Date: 04/13/2019    History of Present Illness The patient is a 69 year old Caucasian Male who was Patient was discharged from [facility] on 1/21 to CIR at [facility] after being treated for acute left midbrain stroke. He subsequently developed GI bleed  (acute onset of black and red stools X 5 overnight and also vomited coffee grounds one time while at rehab) and acute blood loss anemia and was transferred from Rehab and admitted to [facility] on 2/6. He has ESRD and is being dialyzed during his hospitalization Tuesdays Thursdays and Saturdays.   Nephrology and GI following: And the plan was for colonoscopy 04/08/19 however patient refused prep and has been refusing labwork and intervention and became combative and rude to staff. Because of this we consulted Neurology, Psychiatry, and repeat an MRI of the brain and EEG.   Pt has begun prep 2/11, colonoscopy pending.    PT Comments    Generally improving in stamina and gross stability, but gait with RW still requires stability assist due to pt's degrading balance with time up and increasing distance with pt listing more to the R.    Follow Up Recommendations  SNF     Equipment Recommendations  Other (comment)(TBA)    Recommendations for Other Services       Precautions / Restrictions Precautions Precautions: Fall    Mobility  Bed Mobility Overal bed mobility: Needs Assistance Bed Mobility: Supine to Sit     Supine to sit: Min guard     General bed mobility comments: no assist, flat bed.  Transfers Overall transfer level: Needs assistance Equipment used: Rolling walker (2 wheeled) Transfers: Sit to/from Stand Sit to Stand: Min assist         General transfer comment: stability assist needed.   Needs more stability due to R listing the longer he is up.  Ambulation/Gait Ambulation/Gait assistance: Min assist Gait  Distance (Feet): 250 Feet Assistive device: Rolling walker (2 wheeled) Gait Pattern/deviations: Step-through pattern Gait velocity: decreased Gait velocity interpretation: <1.8 ft/sec, indicate of risk for recurrent falls General Gait Details: initially able to stand in midline, but once he starts ambulation he starts listing right in the RW more, biased more to the R side of the RW and needs more assist to w/shift to the L.  Pt cannot maintain balance during gait without assist, even with the RW   Stairs             Wheelchair Mobility    Modified Rankin (Stroke Patients Only) Modified Rankin (Stroke Patients Only) Modified Rankin: Moderately severe disability     Balance Overall balance assessment: Needs assistance Sitting-balance support: Feet supported;Single extremity supported;No upper extremity supported Sitting balance-Leahy Scale: Fair       Standing balance-Leahy Scale: Poor Standing balance comment: reliant on AD or external support.  Tends to list Right more than posteriorly and cannot return to midline or hold without minimal assist                            Cognition Arousal/Alertness: Awake/alert Behavior During Therapy: [facility] for tasks assessed/performed Overall Cognitive Status: Within Functional Limits for tasks assessed(NT formally)                                 General Comments: Poor insight into  functional deficits.      Exercises      General Comments General comments (skin integrity, edema, etc.): vss      Pertinent Vitals/Pain Pain Assessment: Faces Faces Pain Scale: No hurt    Home Living                      Prior Function            PT Goals (current goals can now be found in the care plan section) Acute Rehab PT Goals PT Goal Formulation: With patient Time For Goal Achievement: 04/23/19 Potential to Achieve Goals: Good Progress towards PT goals: Progressing toward goals     Frequency    Min 3X/week      PT Plan Current plan remains appropriate    Co-evaluation              AM-PAC PT "6 Clicks" Mobility   Outcome Measure  Help needed turning from your back to your side while in a flat bed without using bedrails?: A Little Help needed moving from lying on your back to sitting on the side of a flat bed without using bedrails?: A Little Help needed moving to and from a bed to a chair (including a wheelchair)?: A Little Help needed standing up from a chair using your arms (e.g., wheelchair or bedside chair)?: A Little Help needed to walk in hospital room?: A Little Help needed climbing 3-5 steps with a railing? : A Lot 6 Click Score: 17    End of Session   Activity Tolerance: Patient tolerated treatment well;Patient limited by fatigue Patient left: in chair;with call bell/phone within reach Nurse Communication: Mobility status PT Visit Diagnosis: Unsteadiness on feet (R26.81);Other abnormalities of gait and mobility (R26.89)     Time: 0017-4944 PT Time Calculation (min) (ACUTE ONLY): 20 min  Charges:  $Gait Training: 8-22 mins                     04/13/2019  Ginger Carne., PT Acute Rehabilitation Services [phone]  (pager) [phone]  (office)   Tessie Fass Amonte Brookover 04/13/2019, 5:50 PM

## 2019-04-13 NOTE — Progress Notes (Signed)
PROGRESS NOTE    Kyle [name]  ENI:[id number] DOB: April 30, 1950 DOA: 04/04/2019 PCP: Idelle Crouch, MD   Brief Narrative:  The patient is a 69 year old Caucasian Male who was Patient was discharged from [facility] on 1/21 to CIR at [facility] after being treated for acute left midbrain stroke. He subsequently developed GI bleed(acute onset of black and red stools X 5 overnight and also vomited coffee grounds one timewhile at rehab)and acute blood loss anemia and was transferred from Rehab and admitted to [facility] on 2/6. He has ESRD and is being dialyzed during his hospitalization Tuesdays Thursdays and Saturdays. Nephrology and GI following: And the plan was for colonoscopy 04/08/19 however patient refused prep and had been refusing labwork and intervention and became combative and rude to staff. Because of this we consulted Neurology, Psychiatry, and repeat an MRI of the brain and EEG.   Patient is now improved more cooperative and had his bowel prep and having bowel movements. Underwent colonoscopy which showed a single nonbleeding colonic angiodysplastic lesion that was treated with a monopolar probe and as well as diverticulosis in the entire examined colon and medium-sized lipoma in the transverse colon.  GI recommended switching the patient back to his Plavix and will continue the PPI and change to p.o. an outpatient diet has been advanced.  Currently patient is still awaiting outpatient placement CLIP for Dialysis and appears medically stable for D/C now. He was last Dialyzed on Saturday and will dialyze again tomorrow.  Currently also there are no SNF offer for the patient  Assessment & Plan:   Principal Problem:   GI bleed Active Problems:   Chronic obstructive pulmonary disease (HCC)   Diabetes mellitus (HCC)   Insulin dependent type 2 diabetes mellitus ([place])   ESRD (end stage renal disease) ([place])   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HCC)   Acute upper GI bleed  GI bleed/Acute blood loss  anemia in the setting of single colonic angiodysplastic lesion superimposed on chronic anemia of kidney disease -S/p EGDno bleeding source find in EGDwith duodenitis and gastritis, patient has a colonoscopy in 11/2018 "adenomatous polyps and a tubulovillous adenoma were removed and extensive diverticulosis was noted" -He denies Abdominal Pain  -S/p Transfusion of 2 units of pRBC's, and hemoglobin/hematocrit had improved to 9.2/27.8. Today it dropped a little bit from yesterday but remains relatively stable at 8.1/24.3 -Continue to Monitor for Melanotic Stools. Patient states he is no longer having them  -GI recommend continue PPIdrip through 04/07/19 then transition to IV BID PPI and have now changed to p.o. PPI -Colonoscopy done 04/09/2019 showed "A single non-bleeding colonic angiodysplastic lesion. Treated with a monopolar probe. Diverticulosis in the entire examined colon. Medium-sized lipoma in the transverse colon."  -Patient is again calmer today and pleasant and has had no behavioral issues; He states he did not sleep well last night  HTN -Currently was 132/77 this AM -Resumed Amlodipine 2.5 mg po Daily and Increase Metoprolol back to Home dose  -Cozaar is still being held but will resume this AM at half the dose of 50 mg po Daily   CVA withdifficulty with balance and ptosis of left eyelid; CVA from small vessel disease -He was on asa/plavix for three wks then asa alone but now changed to just Plavix per the most recent Neurology recommendation (Could do Either ASA or Plavix) -Mental status improved yesterday but, checking CT head after further discussion with wife behavior yesterday was not normal -Repeat Head CT showed "No acute abnormality.  Expected evolution of the previously demonstrated left paramedian midbrain infarction. 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." -Now he remains agitated and combative and refusing  interventions -Repeated Brain MRI w/o Contrast and Consult Psych for behavioral issues and Capacity Evaluation and have also asked neurology to come back; patient does have the mental capacity make his own decisions and neurology feels that symptoms were related to delirium  -Held ASA and Plavix in the setting of gi bleed initially but will not continue DAPT; -ContinueAtorvaStatin 80 mg po Daily  -Repeat MRI as below and EEG normal  -PT/OT recommending SNF and awaiting bed placement  Seizure Disorder  -On Keppra chronically -Continued Levitracetam 500 mg IV q12h and receives an extra dose with dialysis and now this is po 500 mg  -Check EEG head was normal as below  Insulin DependentDM2 -He was discharged on insulin (lantus and ssi) from recent hospitalization -CBGs ranging from 121-216 -Continue with Moderate NovoLog/scale insulin before meals and at bedtime  AMS/Encephalopathy, likely metabolic, improving likely was some hospital delirium, much improved -Patient is alert and oriented however wife states that this is a total change from his baseline; Today he is more appropriate and pleasant and cooperative  -Check blood cultures x2 and Pending, urinalysis and urine culture as well as chest x-ray  -Patient's PD fluid was negative for infection -Checked head MRI and showed "No acute intracranial abnormality. Expected evolution of midbrain infarct since 03/15/2019. 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 No seizures or epileptiform discharges were seen throughout the recording." -Neurology consulted for further evaluation; neurology recommends continuing Keppra and given additional dose after dialysis and recommend reinitiating either aspirin or Plavix when safe and reducing trazodone 50 mg p.o. nightly and suspects that the patient has had some delirium -Psychiatric consulted and patient does  have mental capacity to make informed decisions and would benefit from being given in-depth information regards to his procedures and treatments -Appears more pleasant and oriented and appears back to baseline  ESRD was on peritoneal dialysis transitioned to HD on 2/2 and now on TTS Schedule  Metabolic Acidosis, mild and improved  -Status post RIJ TDC and left BCAVF placed by Dr. Carlis Abbott 2/1. -Nephrology consulted for further evaluation; patient has a right upper extremity AV fistula that was placed on 03/30/2019 -Received HD 04/07/19, 04/09/19 and 04/11/19 and next scheduled Dialysis session is 04/13/14 -Discussed with Nephrology and they will obtain a PD sample to r/o Infection  -Patient's BUN/Cr went from 46/5.78 -> 53/7.08 -> 21/4.80 -> 22/5.50 -> 8/3.32 -> 16/5.42 -> 12/4.02 -> 23/5.82 today  -CO2 today is 25, Chloride is 102, and AG is 12 -Currently still awaiting outpatient HD placement and Nephrology stated might be a little difficult since he dialyzes at [place];  -Patient went to Dialysis yesterday  -Other nephrotoxic medications, contrast dyes, hypotension and renally adjust medication  -Continue monitor trend renal function repeat CMP in a.m.  COPD -Not home O2 dependent, -No wheezing on exam, no hypoxia  Chronic RBBB -EKG stable  Hypothyroidism -Continue Levothyroxine 200 mcg po Daily but may need to change to IV since he has been refusing -Checked TSH and was 8.316 and Free T4 was 0.86  -Repeat TFT's in 4-6 weeks as an outpatient   Thrombocytopenia -In the setting of his bleeding and now improved -Patient's platelet count now is 206,000 -Continue to monitor for signs and symptoms  of bleeding -Repeat CBC in a.m.  Hypomagnesemia  -Patient's magnesium level this morning was 1.9 -Continue to monitor and replete as necessary -Repeat Mag Level in the AM   Hypophosphatemia  -Patient's phosphorus level this AM is 3.5 -Continue monitor and Replete as Necessary  -Repeat  CMP in a.m.  Hypokalemia  -Patient's potassium this morning was 3.8 -Continue to Monitor and Replete as Necessary -Repeat CMP in AM   Hypocalcemia -Was 6.7 this AM -Corrected Ca2+ for Albumin is 8.3 -Nephrology started Calcium Carbonate 400 mg po qHS -Continue to Monitor and Trend and repeat CMP in AM   Insomnia -States he could not sleep last night -Will give Benadryl 25 mg qHS  DVT prophylaxis: SCDs and Heparin 5,000 units sq q8h  Code Status: FULL CODE Family Communication: No family at bedside but updated updated wife via the telephone yesteday Disposition Plan: Patient is from CIR and had a GI bleed; Required inpatient hospitalization for colonoscopy and further workup; he is now deemed medically stable for discharge however current barriers include SNF placement as well as being clipped to an outpatient dialysis center   Consultants:   Gastroenterology  Nephrology  Neurology  Psychiatry   Procedures:  EGD on 04/05/2019 Findings:      The examined esophagus was normal.      The Z-line was regular and was found 42 cm from the incisors.      Localized moderate inflammation characterized by congestion (edema),       erosions and erythema was found in the prepyloric region of the stomach.      The cardia and gastric fundus were normal on retroflexion.      Patchy severe mucosal changes characterized by congestion, erythema,       erosion and nodularity were found in the duodenal bulb. Biopsies were       taken with a cold forceps for histology. Estimated blood loss was       minimal.      The second portion of the duodenum was normal. Impression:               - Normal esophagus.                           - Z-line regular, 42 cm from the incisors.                           - Gastritis.                           - Mucosal changes in the duodenum. Biopsied.                           - Normal second portion of the duodenum.  COLONOSCOPY 04/09/2019 Findings:      A  single small localized angiodysplastic lesion without bleeding was       found in the transverse colon. Coagulation for tissue destruction using       monopolar probe was successful.      Scattered small and large-mouthed diverticula were found in the entire       colon.      There was a medium-sized lipoma, in the transverse colon.      Scattered diverticula were noted throughout the colon, but the highest       concentration was in  the left side of the colon. A small nonbleeding AVM       was ablated with APC. There was evidence of bleeding in the cecum, but       with closer examination, this was consistent with barotrauma. Impression:               - A single non-bleeding colonic angiodysplastic                            lesion. Treated with a monopolar probe.                           - Diverticulosis in the entire examined colon.                           - Medium-sized lipoma in the transverse colon.                           - No specimens     Antimicrobials:  Anti-infectives (From admission, onward)   None     Subjective: Seen and examined at bedside and he had no complaints but states sleep is his biggest issue. No CP, SOB.  Feels fairly well and appears back to baseline.  No other concerns or plans at this time.  Objective: Vitals:   04/12/19 1408 04/12/19 1707 04/12/19 2157 04/13/19 0410  BP: (!) 127/59 140/62 (!) 148/66 133/70  Pulse: 87 64 67 64  Resp: 20 15 11 12   Temp: 97.8 F (36.6 C) 98 F (36.7 C) 97.8 F (36.6 C) 98 F (36.7 C)  TempSrc:  Oral Oral Axillary  SpO2: 98% 97% 100% 97%  Weight:        Intake/Output Summary (Last 24 hours) at 04/13/2019 0804 Last data filed at 04/12/2019 1900 Gross per 24 hour  Intake 960 ml  Output 0 ml  Net 960 ml   Filed Weights   04/09/19 1359 04/09/19 1623 04/09/19 2000  Weight: 96.3 kg 96.5 kg 95 kg   Examination: Physical Exam:  Constitutional: WN/WD overweight Caucasian male currently no acute distress  appears calm and comfortable watching television Eyes: Has ptosis of the left eye from his CVA; right eye appeared normal and sclera anicteric ENMT: External Ears, Nose appear normal. Grossly normal hearing. Neck: Appears normal, supple, no cervical masses, normal ROM, no appreciable thyromegaly neck: No appreciable JVD Respiratory: Mildly diminished to auscultation bilaterally, no wheezing, rales, rhonchi or crackles. Normal respiratory effort and patient is not tachypenic. No accessory muscle use.  Unlabored breathing Cardiovascular: RRR, no murmurs / rubs / gallops. S1 and S2 auscultated. No extremity edema. Abdomen: Soft, non-tender, mildly distended secondary body habitus.  No masses palpated. No appreciable hepatosplenomegaly. Bowel sounds positive x4.  GU: Deferred. Musculoskeletal: No clubbing / cyanosis of digits/nails. No joint deformity upper and lower extremities.  Skin: No rashes, lesions, ulcers on a limited skin evaluation. No induration; Warm and dry.  Neurologic: CN 2-12 grossly intact with no focal deficits. Romberg sign and cerebellar reflexes not assessed.  Psychiatric: Normal judgment and insight. Alert and oriented x 3. Normal mood and appropriate affect.   Data Reviewed: I have personally reviewed following labs and imaging studies  CBC: Recent Labs  Lab 04/09/19 0407 04/10/19 0229 04/11/19 0526 04/12/19 0436 04/13/19 0620  WBC 10.9* 6.8 6.7 7.4 7.6  NEUTROABS 9.1*  5.2 4.7 5.4 5.7  HGB 9.2* 8.8* 8.6* 8.6* 8.1*  HCT 27.8* 26.6* 26.2* 25.7* 24.3*  MCV 93.0 92.4 94.6 93.5 93.1  PLT 179 183 192 190 629   Basic Metabolic Panel: Recent Labs  Lab 04/09/19 0407 04/10/19 0229 04/11/19 0526 04/12/19 0436 04/13/19 0620  NA 140 137 139 138 139  K 4.2 3.4* 3.6 4.0 3.8  CL 104 102 105 103 102  CO2 23 26 25 24 25   GLUCOSE 112* 179* 135* 136* 126*  BUN 22 8 16 12 23   CREATININE 5.50* 3.32* 5.42* 4.02* 5.82*  CALCIUM 7.4* 7.3* 7.3* 6.9* 6.7*  MG 1.4* 1.4* 1.9 1.6*  1.9  PHOS 2.9 2.1* 2.9 1.8* 3.5   GFR: Estimated Creatinine Clearance: 14.1 mL/min (A) (by C-G formula based on SCr of 5.82 mg/dL (H)). Liver Function Tests: Recent Labs  Lab 04/09/19 0407 04/10/19 0229 04/11/19 0526 04/12/19 0436 04/13/19 0620  AST 21 19 17 18 17   ALT 15 13 13 12 11   ALKPHOS 68 68 59 60 64  BILITOT 0.9 0.6 0.6 0.7 0.5  PROT 4.7* 4.6* 4.7* 4.6* 4.3*  ALBUMIN 2.2* 2.1* 2.1* 2.1* 2.0*   No results for input(s): LIPASE, AMYLASE in the last 168 hours. No results for input(s): AMMONIA in the last 168 hours. Coagulation Profile: No results for input(s): INR, PROTIME in the last 168 hours. Cardiac Enzymes: No results for input(s): CKTOTAL, CKMB, CKMBINDEX, TROPONINI in the last 168 hours. BNP (last 3 results) No results for input(s): PROBNP in the last 8760 hours. HbA1C: No results for input(s): HGBA1C in the last 72 hours. CBG: Recent Labs  Lab 04/11/19 2129 04/12/19 0753 04/12/19 1134 04/12/19 1700 04/12/19 2155  GLUCAP 203* 121* 192* 166* 216*   Lipid Profile: No results for input(s): CHOL, HDL, LDLCALC, TRIG, CHOLHDL, LDLDIRECT in the last 72 hours. Thyroid Function Tests: No results for input(s): TSH, T4TOTAL, FREET4, T3FREE, THYROIDAB in the last 72 hours. Anemia Panel: No results for input(s): VITAMINB12, FOLATE, FERRITIN, TIBC, IRON, RETICCTPCT in the last 72 hours. Sepsis Labs: No results for input(s): PROCALCITON, LATICACIDVEN in the last 168 hours.  Recent Results (from the past 240 hour(s))  Surgical pcr screen     Status: None   Collection Time: 04/04/19  8:22 PM   Specimen: Nasal Mucosa; Nasal Swab  Result Value Ref Range Status   MRSA, PCR NEGATIVE NEGATIVE Final   Staphylococcus aureus NEGATIVE NEGATIVE Final    Comment: (NOTE) The Xpert SA Assay (FDA approved for NASAL specimens in patients 41 years of age and older), is one component of a comprehensive surveillance program. It is not intended to diagnose infection nor to guide or  monitor treatment. Performed at [facility], [place] [address] [place], [place] [address]   Culture, blood (routine x 2)     Status: None   Collection Time: 04/08/19  4:12 PM   Specimen: BLOOD  Result Value Ref Range Status   Specimen Description BLOOD RIGHT ANTECUBITAL  Final   Special Requests   Final    BOTTLES DRAWN AEROBIC AND ANAEROBIC Blood Culture adequate volume   Culture   Final    NO GROWTH 5 DAYS Performed at [facility], [place] [address], [place], [place] [address]    Report Status 04/13/2019 FINAL  Final  Culture, blood (routine x 2)     Status: None   Collection Time: 04/08/19  4:12 PM   Specimen: BLOOD RIGHT HAND  Result Value Ref Range Status  Specimen Description BLOOD RIGHT HAND  Final   Special Requests   Final    BOTTLES DRAWN AEROBIC AND ANAEROBIC Blood Culture adequate volume   Culture   Final    NO GROWTH 5 DAYS Performed at [facility], [address], [place], [place] [address]    Report Status 04/13/2019 FINAL  Final     RN Pressure Injury Documentation:     Estimated body mass index is 28.4 kg/m as calculated from the following:   Height as of 03/20/19: 6' (1.829 m).   Weight as of this encounter: 95 kg.  Malnutrition Type:  Nutrition Problem: Inadequate oral intake Etiology: inability to eat   Malnutrition Characteristics:  Signs/Symptoms: NPO status   Nutrition Interventions:  Interventions: Refer to RD note for recommendations  Radiology Studies: No results found. Scheduled Meds: . sodium chloride   Intravenous Once  . amLODipine  2.5 mg Oral Daily  . atorvastatin  80 mg Oral Daily  . calcium carbonate  400 mg of elemental calcium Oral QHS  . chlorhexidine  15 mL Mouth Rinse BID  . Chlorhexidine Gluconate Cloth  6 each Topical Daily  . clopidogrel  75 mg Oral Daily  . darbepoetin (ARANESP) injection - DIALYSIS  100 mcg Intravenous Q Sat-HD  . feeding supplement (NEPRO CARB STEADY)  237 mL Oral BID BM  .  feeding supplement (PRO-STAT SUGAR FREE 64)  30 mL Oral BID  . gentamicin cream  1 application Topical Daily  . heparin injection (subcutaneous)  5,000 Units Subcutaneous Q8H  . insulin aspart  0-15 Units Subcutaneous TID WC  . levETIRAcetam  500 mg Oral BID  . levETIRAcetam  500 mg Oral Q T,Th,Sa-HD  . levothyroxine  200 mcg Oral q morning - 10a  . lipase/protease/amylase  24,000 Units Oral TID  . losartan  50 mg Oral Daily  . metoprolol succinate  50 mg Oral Daily  . multivitamin  1 tablet Oral QHS  . naloxone  0.4 mg Intramuscular Once  . pantoprazole  40 mg Oral BID  . traZODone  50 mg Oral QHS   Continuous Infusions:   LOS: 9 days   Kerney Elbe, DO [facility] PAGER is on AMION  If 7PM-7AM, please contact night-coverage [email or web address]

## 2019-04-13 NOTE — TOC Progression Note (Signed)
Transition of Care [facility]) - Progression Note    Patient Details  Name: Dontrae Morini MRN: [id number] Date of Birth: 1950/09/09  Transition of [facility]) CM/SW [place], [place] Phone Number: 04/13/2019, 8:13 AM  Clinical Narrative:    Per notes from Caren Griffins, [place], and pt MD pt plan is for SNF placement. At this time no offers found in secure hub. Barriers include- currently on a TTS schedule awaiting outpatient dialysis unit placement. TOC team will continue to seek placement for pt to continue his rehab.    Expected Discharge Plan: [place] Barriers to Discharge: Continued Medical Work up  Expected Discharge Plan and Services Expected Discharge Plan: [place] In-house Referral: Clinical Social Work  Living arrangements for the past 2 months: Single Family Home    Readmission Risk Interventions No flowsheet data found.

## 2019-04-14 LAB — COMPREHENSIVE METABOLIC PANEL
ALT: 11 U/L (ref 0–44)
AST: 15 U/L (ref 15–41)
Albumin: 2 g/dL — ABNORMAL LOW (ref 3.5–5.0)
Alkaline Phosphatase: 64 U/L (ref 38–126)
Anion gap: 11 (ref 5–15)
BUN: 28 mg/dL — ABNORMAL HIGH (ref 8–23)
CO2: 23 mmol/L (ref 22–32)
Calcium: 6.5 mg/dL — ABNORMAL LOW (ref 8.9–10.3)
Chloride: 105 mmol/L (ref 98–111)
Creatinine, Ser: 7.3 mg/dL — ABNORMAL HIGH (ref 0.61–1.24)
GFR calc Af Amer: 8 mL/min — ABNORMAL LOW (ref >60)
GFR calc non Af Amer: 7 mL/min — ABNORMAL LOW (ref >60)
Glucose, Bld: 124 mg/dL — ABNORMAL HIGH (ref 70–99)
Potassium: 3.6 mmol/L (ref 3.5–5.1)
Sodium: 139 mmol/L (ref 135–145)
Total Bilirubin: 0.7 mg/dL (ref 0.3–1.2)
Total Protein: 4.3 g/dL — ABNORMAL LOW (ref 6.5–8.1)

## 2019-04-14 LAB — GLUCOSE, CAPILLARY
Glucose-Capillary: 115 mg/dL — ABNORMAL HIGH (ref 70–99)
Glucose-Capillary: 174 mg/dL — ABNORMAL HIGH (ref 70–99)
Glucose-Capillary: 105 mg/dL — ABNORMAL HIGH (ref 70–99)
Glucose-Capillary: 282 mg/dL — ABNORMAL HIGH (ref 70–99)

## 2019-04-14 LAB — CBC WITH DIFFERENTIAL/PLATELET
Abs Immature Granulocytes: 0.04 10*3/uL (ref 0.00–0.07)
Basophils Absolute: 0.1 10*3/uL (ref 0.0–0.1)
Basophils Relative: 1 %
Eosinophils Absolute: 0.2 10*3/uL (ref 0.0–0.5)
Eosinophils Relative: 2 %
HCT: 23.8 % — ABNORMAL LOW (ref 39.0–52.0)
Hemoglobin: 7.8 g/dL — ABNORMAL LOW (ref 13.0–17.0)
Immature Granulocytes: 1 %
Lymphocytes Relative: 17 %
Lymphs Abs: 1.2 10*3/uL (ref 0.7–4.0)
MCH: 30.7 pg (ref 26.0–34.0)
MCHC: 32.8 g/dL (ref 30.0–36.0)
MCV: 93.7 fL (ref 80.0–100.0)
Monocytes Absolute: 0.5 10*3/uL (ref 0.1–1.0)
Monocytes Relative: 7 %
Neutro Abs: 5 10*3/uL (ref 1.7–7.7)
Neutrophils Relative %: 72 %
Platelets: 221 10*3/uL (ref 150–400)
RBC: 2.54 MIL/uL — ABNORMAL LOW (ref 4.22–5.81)
RDW: 15.3 % (ref 11.5–15.5)
WBC: 6.8 10*3/uL (ref 4.0–10.5)
nRBC: 0 % (ref 0.0–0.2)

## 2019-04-14 LAB — MAGNESIUM: Magnesium: 1.7 mg/dL (ref 1.7–2.4)

## 2019-04-14 LAB — PHOSPHORUS: Phosphorus: 3.7 mg/dL (ref 2.5–4.6)

## 2019-04-14 MED ORDER — HEPARIN SODIUM (PORCINE) 1000 UNIT/ML IJ SOLN
INTRAMUSCULAR | Status: AC
  Administered 2019-04-14: 1000 [IU]
  Filled 2019-04-14: qty 4

## 2019-04-14 NOTE — Progress Notes (Addendum)
Patient ID: Kyle [name], male   DOB: 07/04/1950, 69 y.o.   MRN: [id number] S: No new complaints.  Has not gone for HD yet today O:BP (!) 158/67 (BP Location: Right Arm)   Pulse 78   Temp 98.6 F (37 C) (Axillary)   Resp 12   Wt 95 kg   SpO2 99%   BMI 28.40 kg/m   Intake/Output Summary (Last 24 hours) at 04/14/2019 1247 Last data filed at 04/13/2019 1819 Gross per 24 hour  Intake 360 ml  Output 1 ml  Net 359 ml   Intake/Output: I/O last 3 completed shifts: In: 840 [P.O.:840] Out: 1 [Stool:1]  Intake/Output this shift:  No intake/output data recorded. Weight change:  Gen: NAD CVS: no rub Resp: occ rhonchi Abd: +BS, soft, NT/ND Ext: no edema, LUE AVF +T/B  Recent Labs  Lab 04/08/19 1612 04/09/19 0407 04/10/19 0229 04/11/19 0526 04/12/19 0436 04/13/19 0620 04/14/19 0528  NA 140 140 137 139 138 139 139  K 4.0 4.2 3.4* 3.6 4.0 3.8 3.6  CL 101 104 102 105 103 102 105  CO2 24 23 26 25 24 25 23   GLUCOSE 124* 112* 179* 135* 136* 126* 124*  BUN 21 22 8 16 12 23  28*  CREATININE 4.80* 5.50* 3.32* 5.42* 4.02* 5.82* 7.30*  ALBUMIN 2.0* 2.2* 2.1* 2.1* 2.1* 2.0* 2.0*  CALCIUM 7.3* 7.4* 7.3* 7.3* 6.9* 6.7* 6.5*  PHOS 2.8 2.9 2.1* 2.9 1.8* 3.5 3.7  AST 22 21 19 17 18 17 15   ALT 13 15 13 13 12 11 11    Liver Function Tests: Recent Labs  Lab 04/12/19 0436 04/13/19 0620 04/14/19 0528  AST 18 17 15   ALT 12 11 11   ALKPHOS 60 64 64  BILITOT 0.7 0.5 0.7  PROT 4.6* 4.3* 4.3*  ALBUMIN 2.1* 2.0* 2.0*   No results for input(s): LIPASE, AMYLASE in the last 168 hours. No results for input(s): AMMONIA in the last 168 hours. CBC: Recent Labs  Lab 04/10/19 0229 04/10/19 0229 04/11/19 0526 04/11/19 0526 04/12/19 0436 04/13/19 0620 04/14/19 0528  WBC 6.8   < > 6.7   < > 7.4 7.6 6.8  NEUTROABS 5.2   < > 4.7   < > 5.4 5.7 5.0  HGB 8.8*   < > 8.6*   < > 8.6* 8.1* 7.8*  HCT 26.6*   < > 26.2*   < > 25.7* 24.3* 23.8*  MCV 92.4  --  94.6  --  93.5 93.1 93.7  PLT 183   < >  192   < > 190 206 221   < > = values in this interval not displayed.   Cardiac Enzymes: No results for input(s): CKTOTAL, CKMB, CKMBINDEX, TROPONINI in the last 168 hours. CBG: Recent Labs  Lab 04/13/19 1136 04/13/19 1529 04/13/19 2040 04/14/19 0740 04/14/19 1111  GLUCAP 153* 220* 266* 115* 174*    Iron Studies: No results for input(s): IRON, TIBC, TRANSFERRIN, FERRITIN in the last 72 hours. Studies/Results: No results found. . sodium chloride   Intravenous Once  . amLODipine  2.5 mg Oral Daily  . atorvastatin  80 mg Oral Daily  . calcium carbonate  400 mg of elemental calcium Oral QHS  . chlorhexidine  15 mL Mouth Rinse BID  . Chlorhexidine Gluconate Cloth  6 each Topical Daily  . clopidogrel  75 mg Oral Daily  . darbepoetin (ARANESP) injection - DIALYSIS  100 mcg Intravenous Q Sat-HD  . feeding supplement (NEPRO CARB STEADY)  237  mL Oral BID BM  . feeding supplement (PRO-STAT SUGAR FREE 64)  30 mL Oral BID  . gentamicin cream  1 application Topical Daily  . heparin injection (subcutaneous)  5,000 Units Subcutaneous Q8H  . insulin aspart  0-15 Units Subcutaneous TID WC  . levETIRAcetam  500 mg Oral BID  . levETIRAcetam  500 mg Oral Q T,Th,Sa-HD  . levothyroxine  200 mcg Oral q morning - 10a  . lipase/protease/amylase  24,000 Units Oral TID  . losartan  50 mg Oral Daily  . metoprolol succinate  50 mg Oral Daily  . multivitamin  1 tablet Oral QHS  . naloxone  0.4 mg Intramuscular Once  . pantoprazole  40 mg Oral BID  . traZODone  50 mg Oral QHS    BMET    Component Value Date/Time   NA 139 04/14/2019 0528   NA 138 08/08/2013 1947   K 3.6 04/14/2019 0528   K 3.9 08/08/2013 1947   CL 105 04/14/2019 0528   CL 107 08/08/2013 1947   CO2 23 04/14/2019 0528   CO2 21 08/08/2013 1947   GLUCOSE 124 (H) 04/14/2019 0528   GLUCOSE 100 (H) 08/08/2013 1947   BUN 28 (H) 04/14/2019 0528   BUN 32 (H) 08/08/2013 1947   CREATININE 7.30 (H) 04/14/2019 0528   CREATININE 2.04 (H)  08/08/2013 1947   CALCIUM 6.5 (L) 04/14/2019 0528   CALCIUM 9.1 08/08/2013 1947   GFRNONAA 7 (L) 04/14/2019 0528   GFRNONAA 33 (L) 08/08/2013 1947   GFRAA 8 (L) 04/14/2019 0528   GFRAA 38 (L) 08/08/2013 1947   CBC    Component Value Date/Time   WBC 6.8 04/14/2019 0528   RBC 2.54 (L) 04/14/2019 0528   HGB 7.8 (L) 04/14/2019 0528   HGB 14.2 08/08/2013 1947   HCT 23.8 (L) 04/14/2019 0528   HCT 42.1 08/08/2013 1947   PLT 221 04/14/2019 0528   PLT 144 (L) 08/08/2013 1947   MCV 93.7 04/14/2019 0528   MCV 92 08/08/2013 1947   MCH 30.7 04/14/2019 0528   MCHC 32.8 04/14/2019 0528   RDW 15.3 04/14/2019 0528   RDW 12.9 08/08/2013 1947   LYMPHSABS 1.2 04/14/2019 0528   MONOABS 0.5 04/14/2019 0528   EOSABS 0.2 04/14/2019 0528   BASOSABS 0.1 04/14/2019 0528     Assessment/Plan:  1. ESRD- transitioned from PD to HD following his stroke.  Continue with TTS schedule.  Awaiting outpatient SNF placement so we can find a dialysis unit 2. 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 3. Acute pontine stroke- continued rehab 4. HTN- stable 5. DM- per primary 6. Intermittent agitation- improving 7. Vascular access- s/p LUE AVF placed 03/30/19 8. Disposition- for possible discharge to SNF tomorrow and will need to switch to MWF schedule.  Plan for short HD session today and discharge tentatively tomorrow and first outpatient on Friday 04/17/19.  Donetta Potts, MD [name] [phone]

## 2019-04-14 NOTE — Progress Notes (Signed)
PROGRESS NOTE    Kyle [name]  LPF:[id number] DOB: 06-09-1950 DOA: 04/04/2019 PCP: Idelle Crouch, MD   Brief Narrative:  The patient is a 69 year old Caucasian Male who was Patient was discharged from [facility] on 1/21 to CIR at [facility] after being treated for acute left midbrain stroke. He subsequently developed GI bleed(acute onset of black and red stools X 5 overnight and also vomited coffee grounds one timewhile at rehab)and acute blood loss anemia and was transferred from Rehab and admitted to [facility] on 2/6. He has ESRD and is being dialyzed during his hospitalization Tuesdays Thursdays and Saturdays. Nephrology and GI following: And the plan was for colonoscopy 04/08/19 however patient refused prep and had been refusing labwork and intervention and became combative and rude to staff. Because of this we consulted Neurology, Psychiatry, and repeat an MRI of the brain and EEG.   Patient is now improved more cooperative and had his bowel prep and having bowel movements. Underwent colonoscopy which showed a single nonbleeding colonic angiodysplastic lesion that was treated with a monopolar probe and as well as diverticulosis in the entire examined colon and medium-sized lipoma in the transverse colon.  GI recommended switching the patient back to his Plavix and will continue the PPI and change to p.o. an outpatient diet has been advanced.  Currently patient is still awaiting outpatient placement CLIP for Dialysis and appears medically stable for D/C now. He was last Dialyzed on Saturday and will dialyze again tomorrow.  Currently also there are no SNF offers for the patient and will need to have SNF secured prior to having arrangement for outpatient dialysis  Assessment & Plan:   Principal Problem:   GI bleed Active Problems:   Chronic obstructive pulmonary disease (HCC)   Diabetes mellitus ([place])   Insulin dependent type 2 diabetes mellitus ([place])   ESRD (end stage renal disease)  ([place])   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[place])   Acute upper GI bleed  GI bleed/Acute blood loss anemia in the setting of single colonic angiodysplastic lesion superimposed on chronic anemia of kidney disease -S/p EGDno bleeding source find in EGDwith duodenitis and gastritis, patient has a colonoscopy in 11/2018 "adenomatous polyps and a tubulovillous adenoma were removed and extensive diverticulosis was noted" -He denies Abdominal Pain  -S/p Transfusion of 2 units of pRBC's, and hemoglobin/hematocrit had improved to 9.2/27.8. Today it dropped a again  little bit from yesterday but remains relatively stable and went from 8.1/24.3 -> 7.8/23.8 -Continue to Monitor for Melanotic Stools. Patient states he is no longer having them  -GI recommend continue PPIdrip through 04/07/19 then transition to IV BID PPI and have now changed to p.o. PPI -Colonoscopy done 04/09/2019 showed "A single non-bleeding colonic angiodysplastic lesion. Treated with a monopolar probe. Diverticulosis in the entire examined colon. Medium-sized lipoma in the transverse colon."  -Continue to monitor for signs and symptoms of bleeding; currently no overt bleeding noted -Repeat CBC in a.m.  HTN -Currently was 158/67 this a.m. -Resumed Amlodipine 2.5 mg po Daily and Increase Metoprolol back to Home dose  -Cozaar resumed but at half the dose of 50 mg po Daily   CVA withdifficulty with balance and ptosis of left eyelid; CVA from small vessel disease -He was on asa/plavix for three wks then asa alone but now changed to just Plavix per the most recent Neurology recommendation (Could do Either ASA or Plavix) -Mental status improved yesterday but, checking CT head after further discussion with wife behavior yesterday was  not normal -Repeat Head CT showed "No acute abnormality. Expected evolution of the previously demonstrated left paramedian midbrain infarction. 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." -Now he remains agitated and combative and refusing interventions -Repeated Brain MRI w/o Contrast and Consult Psych for behavioral issues and Capacity Evaluation and have also asked neurology to come back; patient does have the mental capacity make his own decisions and neurology feels that symptoms were related to delirium  -Held ASA and Plavix in the setting of gi bleed initially but will not continue DAPT; -ContinueAtorvaStatin 80 mg po Daily  -Repeat MRI as below and EEG normal  -PT/OT recommending SNF and awaiting bed placement  Seizure Disorder  -On Keppra chronically -Continued Levitracetam 500 mg IV q12h and receives an extra dose with dialysis and now this is po 500 mg BID and an extra dose with Dialysis  -Check EEG head was normal as below  Insulin DependentDM2 -He was discharged on insulin (lantus and ssi) from recent hospitalization -CBGs ranging from 115-266 -Continue with Moderate NovoLog/scale insulin before meals and at bedtime  AMS/Encephalopathy, likely metabolic, improving likely was some hospital delirium, much improved -Patient is alert and oriented however wife states that this is a total change from his baseline; Today he is more appropriate and pleasant and cooperative  -Check blood cultures x2 and Pending, urinalysis and urine culture as well as chest x-ray  -Patient's PD fluid was negative for infection -Checked head MRI and showed "No acute intracranial abnormality. Expected evolution of midbrain infarct since 03/15/2019. 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 No seizures or epileptiform discharges were seen throughout the recording." -Neurology consulted for further evaluation; neurology recommends continuing Keppra and given additional dose after dialysis and recommend reinitiating either aspirin or Plavix when safe and reducing trazodone 50 mg p.o.  nightly and suspects that the patient has had some delirium -Psychiatric consulted and patient does have mental capacity to make informed decisions and would benefit from being given in-depth information regards to his procedures and treatments -Appears more pleasant and oriented and appears back to baseline  ESRD was on peritoneal dialysis transitioned to HD on 2/2 and now on TTS Schedule  Metabolic Acidosis, mild and improved  -Status post RIJ TDC and left BCAVF placed by Dr. Carlis Abbott 2/1. -Nephrology consulted for further evaluation; patient has a right upper extremity AV fistula that was placed on 03/30/2019 -Received HD 04/07/19, 04/09/19 and 04/11/19 and next scheduled Dialysis session is 04/13/14 -Discussed with Nephrology and they will obtain a PD sample to r/o Infection  -Patient's BUN/Cr today is 28/7.30 and patient is to Dialyze today  -CO2 today is 23, Chloride is 105, and AG is 11 -Currently still awaiting outpatient HD placement but will need a SNF basement first and Nephrology stated might be a little difficult since he will likely need to be dialyzed at [facility]; -Avoid nephrotoxic medications, contrast dyes, hypotension and renally adjust medication  -Continue monitor trend renal function repeat CMP in a.m.  COPD -Not home O2 dependent, -No wheezing on exam, no hypoxia  Chronic RBBB -EKG stable  Hypothyroidism -Continue Levothyroxine 200 mcg po Daily but may need to change to IV since he has been refusing -Checked TSH and was 8.316 and Free T4 was 0.86  -Repeat TFT's in 4-6 weeks as an outpatient   Thrombocytopenia -In the setting of his bleeding and now improved -Patient's  platelet count now is 221,000 -Continue to monitor for signs and symptoms of bleeding -Repeat CBC in a.m.  Hypomagnesemia  -Patient's magnesium level this morning was 1.7 -Continue to monitor and replete as necessary -Repeat Mag Level in the AM   Hypophosphatemia  -Patient's phosphorus level  this AM is 3.7 -Continue monitor and Replete as Necessary  -Repeat CMP in a.m.  Hypokalemia  -Patient's potassium this morning was 3.6 -Continue to Monitor and Replete as Necessary -Repeat CMP in AM   Hypocalcemia -Was 6.7 yesterday and today is 6.5 AM -Corrected Ca2+ for Albumin is 8.1 -Nephrology started Calcium Carbonate 400 mg po qHS and defer to Nephrology for further treatment  -Continue to Monitor and Trend and repeat CMP in AM   Insomnia -States he slept a little bit better last night -Will give Benadryl 25 mg qHS  DVT prophylaxis: SCDs and Heparin 5,000 units sq q8h  Code Status: FULL CODE Family Communication: No family at bedside but updated updated wife via the telephone yesteday Disposition Plan: Patient is from CIR and had a GI bleed; Required inpatient hospitalization for colonoscopy and further workup; he is now deemed medically stable for discharge however current barriers include SNF placement as well as being clipped to an outpatient dialysis center   Consultants:   Gastroenterology  Nephrology  Neurology  Psychiatry   Procedures:  EGD on 04/05/2019 Findings:      The examined esophagus was normal.      The Z-line was regular and was found 42 cm from the incisors.      Localized moderate inflammation characterized by congestion (edema),       erosions and erythema was found in the prepyloric region of the stomach.      The cardia and gastric fundus were normal on retroflexion.      Patchy severe mucosal changes characterized by congestion, erythema,       erosion and nodularity were found in the duodenal bulb. Biopsies were       taken with a cold forceps for histology. Estimated blood loss was       minimal.      The second portion of the duodenum was normal. Impression:               - Normal esophagus.                           - Z-line regular, 42 cm from the incisors.                           - Gastritis.                           - Mucosal  changes in the duodenum. Biopsied.                           - Normal second portion of the duodenum.  COLONOSCOPY 04/09/2019 Findings:      A single small localized angiodysplastic lesion without bleeding was       found in the transverse colon. Coagulation for tissue destruction using       monopolar probe was successful.      Scattered small and large-mouthed diverticula were found in the entire       colon.      There was a medium-sized lipoma, in the  transverse colon.      Scattered diverticula were noted throughout the colon, but the highest       concentration was in the left side of the colon. A small nonbleeding AVM       was ablated with APC. There was evidence of bleeding in the cecum, but       with closer examination, this was consistent with barotrauma. Impression:               - A single non-bleeding colonic angiodysplastic                            lesion. Treated with a monopolar probe.                           - Diverticulosis in the entire examined colon.                           - Medium-sized lipoma in the transverse colon.                           - No specimens     Antimicrobials:  Anti-infectives (From admission, onward)   None     Subjective: Seen and examined at bedside and he is sitting in the chair watching television without any issues.  States he slept a little bit better.  No nausea or vomiting.  Denies any lightheadedness or dizziness.  No other concerns or complaints at this time.  Objective: Vitals:   04/13/19 2339 04/14/19 0333 04/14/19 0350 04/14/19 0755  BP: (!) 152/67 (!) 141/71 (!) 153/64 128/67  Pulse: 70 72 70 91  Resp: 18 16 15  (!) 47  Temp: 98.5 F (36.9 C) 98.2 F (36.8 C) 98 F (36.7 C) 98.1 F (36.7 C)  TempSrc:  Oral Oral Oral  SpO2: 100% 100% 100% 98%  Weight:        Intake/Output Summary (Last 24 hours) at 04/14/2019 0802 Last data filed at 04/13/2019 1819 Gross per 24 hour  Intake 600 ml  Output 1 ml  Net 599 ml    Filed Weights   04/09/19 1359 04/09/19 1623 04/09/19 2000  Weight: 96.3 kg 96.5 kg 95 kg   Examination: Physical Exam:  Constitutional: WN/WD overweight Caucasian male currently no acute distress appears calm and comfortable sitting in the chair watching television Eyes: Has ptosis of the left eye.  Right eye appears normal with sclera anicteric ENMT: External Ears, Nose appear normal. Grossly normal hearing. Neck: Appears normal, supple, no cervical masses, normal ROM, no appreciable thyromegaly, no JVD Respiratory: Diminished to auscultation bilaterally, no wheezing, rales, rhonchi or crackles.  Unlabored breathing Cardiovascular: RRR, no murmurs / rubs / gallops. S1 and S2 auscultated. No extremity edema.  Abdomen: Soft, non-tender, distended secondary body habitus. No masses palpated. No appreciable hepatosplenomegaly. Bowel sounds positive.  GU: Deferred. Musculoskeletal: No clubbing / cyanosis of digits/nails.  No joint deformities noted  Skin: No rashes, lesions, ulcers on limited skin evaluation. No induration; Warm and dry.  Neurologic: CN 2-12 grossly intact with no focal deficits. Romberg sign and cerebellar reflexes not assessed.  Psychiatric: Normal judgment and insight. Alert and oriented x 3. Normal mood and appropriate affect.   Data Reviewed: I have personally reviewed following labs and imaging studies  CBC: Recent Labs  Lab 04/10/19 0229 04/11/19  6222 04/12/19 0436 04/13/19 0620 04/14/19 0528  WBC 6.8 6.7 7.4 7.6 6.8  NEUTROABS 5.2 4.7 5.4 5.7 5.0  HGB 8.8* 8.6* 8.6* 8.1* 7.8*  HCT 26.6* 26.2* 25.7* 24.3* 23.8*  MCV 92.4 94.6 93.5 93.1 93.7  PLT 183 192 190 206 979   Basic Metabolic Panel: Recent Labs  Lab 04/10/19 0229 04/11/19 0526 04/12/19 0436 04/13/19 0620 04/14/19 0528  NA 137 139 138 139 139  K 3.4* 3.6 4.0 3.8 3.6  CL 102 105 103 102 105  CO2 26 25 24 25 23   GLUCOSE 179* 135* 136* 126* 124*  BUN 8 16 12 23  28*  CREATININE 3.32* 5.42*  4.02* 5.82* 7.30*  CALCIUM 7.3* 7.3* 6.9* 6.7* 6.5*  MG 1.4* 1.9 1.6* 1.9 1.7  PHOS 2.1* 2.9 1.8* 3.5 3.7   GFR: Estimated Creatinine Clearance: 11.3 mL/min (A) (by C-G formula based on SCr of 7.3 mg/dL (H)). Liver Function Tests: Recent Labs  Lab 04/10/19 0229 04/11/19 0526 04/12/19 0436 04/13/19 0620 04/14/19 0528  AST 19 17 18 17 15   ALT 13 13 12 11 11   ALKPHOS 68 59 60 64 64  BILITOT 0.6 0.6 0.7 0.5 0.7  PROT 4.6* 4.7* 4.6* 4.3* 4.3*  ALBUMIN 2.1* 2.1* 2.1* 2.0* 2.0*   No results for input(s): LIPASE, AMYLASE in the last 168 hours. No results for input(s): AMMONIA in the last 168 hours. Coagulation Profile: No results for input(s): INR, PROTIME in the last 168 hours. Cardiac Enzymes: No results for input(s): CKTOTAL, CKMB, CKMBINDEX, TROPONINI in the last 168 hours. BNP (last 3 results) No results for input(s): PROBNP in the last 8760 hours. HbA1C: No results for input(s): HGBA1C in the last 72 hours. CBG: Recent Labs  Lab 04/13/19 0846 04/13/19 1136 04/13/19 1529 04/13/19 2040 04/14/19 0740  GLUCAP 134* 153* 220* 266* 115*   Lipid Profile: No results for input(s): CHOL, HDL, LDLCALC, TRIG, CHOLHDL, LDLDIRECT in the last 72 hours. Thyroid Function Tests: No results for input(s): TSH, T4TOTAL, FREET4, T3FREE, THYROIDAB in the last 72 hours. Anemia Panel: No results for input(s): VITAMINB12, FOLATE, FERRITIN, TIBC, IRON, RETICCTPCT in the last 72 hours. Sepsis Labs: No results for input(s): PROCALCITON, LATICACIDVEN in the last 168 hours.  Recent Results (from the past 240 hour(s))  Surgical pcr screen     Status: None   Collection Time: 04/04/19  8:22 PM   Specimen: Nasal Mucosa; Nasal Swab  Result Value Ref Range Status   MRSA, PCR NEGATIVE NEGATIVE Final   Staphylococcus aureus NEGATIVE NEGATIVE Final    Comment: (NOTE) The Xpert SA Assay (FDA approved for NASAL specimens in patients 33 years of age and older), is one component of a comprehensive  surveillance program. It is not intended to diagnose infection nor to guide or monitor treatment. Performed at [facility], [place] [address], [place], [place] [address]   Culture, blood (routine x 2)     Status: None   Collection Time: 04/08/19  4:12 PM   Specimen: BLOOD  Result Value Ref Range Status   Specimen Description BLOOD RIGHT ANTECUBITAL  Final   Special Requests   Final    BOTTLES DRAWN AEROBIC AND ANAEROBIC Blood Culture adequate volume   Culture   Final    NO GROWTH 5 DAYS Performed at [facility], [place] [address], [place], [place] [address]    Report Status 04/13/2019 FINAL  Final  Culture, blood (routine x 2)     Status: None   Collection  Time: 04/08/19  4:12 PM   Specimen: BLOOD RIGHT HAND  Result Value Ref Range Status   Specimen Description BLOOD RIGHT HAND  Final   Special Requests   Final    BOTTLES DRAWN AEROBIC AND ANAEROBIC Blood Culture adequate volume   Culture   Final    NO GROWTH 5 DAYS Performed at [facility], [address] [place], [place] [address]    Report Status 04/13/2019 FINAL  Final    RN Pressure Injury Documentation:     Estimated body mass index is 28.4 kg/m as calculated from the following:   Height as of 03/20/19: 6' (1.829 m).   Weight as of this encounter: 95 kg.  Malnutrition Type:  Nutrition Problem: Inadequate oral intake Etiology: inability to eat   Malnutrition Characteristics:  Signs/Symptoms: NPO status   Nutrition Interventions:  Interventions: Refer to RD note for recommendations  Radiology Studies: No results found. Scheduled Meds: . sodium chloride   Intravenous Once  . amLODipine  2.5 mg Oral Daily  . atorvastatin  80 mg Oral Daily  . calcium carbonate  400 mg of elemental calcium Oral QHS  . chlorhexidine  15 mL Mouth Rinse BID  . Chlorhexidine Gluconate Cloth  6 each Topical Daily  . clopidogrel  75 mg Oral Daily  . darbepoetin (ARANESP) injection - DIALYSIS  100 mcg  Intravenous Q Sat-HD  . feeding supplement (NEPRO CARB STEADY)  237 mL Oral BID BM  . feeding supplement (PRO-STAT SUGAR FREE 64)  30 mL Oral BID  . gentamicin cream  1 application Topical Daily  . heparin injection (subcutaneous)  5,000 Units Subcutaneous Q8H  . insulin aspart  0-15 Units Subcutaneous TID WC  . levETIRAcetam  500 mg Oral BID  . levETIRAcetam  500 mg Oral Q T,Th,Sa-HD  . levothyroxine  200 mcg Oral q morning - 10a  . lipase/protease/amylase  24,000 Units Oral TID  . losartan  50 mg Oral Daily  . metoprolol succinate  50 mg Oral Daily  . multivitamin  1 tablet Oral QHS  . naloxone  0.4 mg Intramuscular Once  . pantoprazole  40 mg Oral BID  . traZODone  50 mg Oral QHS   Continuous Infusions:   LOS: 10 days   Kerney Elbe, DO [facility] PAGER is on AMION  If 7PM-7AM, please contact night-coverage [email or web address]

## 2019-04-14 NOTE — Progress Notes (Signed)
PT Cancellation Note  Patient Details Name: Kyle [name] MRN: [id number] DOB: [birth year]   Cancelled Treatment:    Reason Eval/Treat Not Completed: Patient at procedure or test/unavailable.  Pt just recently was transferred to HD for about a 4 hour turnaround time.  Will likely not get to see him today.  Will see 2/17 as able. 04/14/2019  Kyle Carne., PT Acute Rehabilitation Services [phone]  (pager) [phone]  (office)   Kyle [name] 04/14/2019, 2:18 PM

## 2019-04-14 NOTE — TOC Progression Note (Signed)
Transition of Care [facility]) - Progression Note    Patient Details  Name: Kyle [name] MRN: [id number] Date of Birth: 10/01/1950  Transition of Care [facility]) CM/SW [place], [place] Phone Number: 04/14/2019, 4:50 PM  Clinical Narrative:     CSW contacted [facility], they can possible accept patient  if his HD clinic is set up for [facility] or [place], MWF. CSW has reached out to HD out patient coordinator- she will follow up with HD clinics and then inform CSW of outcome.  CSW will continue to follow and assist with discharge planning.   Thurmond Butts, MSW, [place] Clinical Social Worker   Expected Discharge Plan: Skilled Nursing Facility Barriers to Discharge: Continued Medical Work up  Expected Discharge Plan and Services Expected Discharge Plan: [place] In-house Referral: Clinical Social Work     Living arrangements for the past 2 months: Single Family Home                                       Social Determinants of Health (SDOH) Interventions    Readmission Risk Interventions No flowsheet data found.

## 2019-04-15 DIAGNOSIS — Z992 Dependence on renal dialysis: Secondary | ICD-10-CM

## 2019-04-15 DIAGNOSIS — E1122 Type 2 diabetes mellitus with diabetic chronic kidney disease: Secondary | ICD-10-CM

## 2019-04-15 LAB — CBC WITH DIFFERENTIAL/PLATELET
Abs Immature Granulocytes: 0.03 10*3/uL (ref 0.00–0.07)
Basophils Absolute: 0.1 10*3/uL (ref 0.0–0.1)
Basophils Relative: 1 %
Eosinophils Absolute: 0.1 10*3/uL (ref 0.0–0.5)
Eosinophils Relative: 2 %
HCT: 22.4 % — ABNORMAL LOW (ref 39.0–52.0)
Hemoglobin: 7.5 g/dL — ABNORMAL LOW (ref 13.0–17.0)
Immature Granulocytes: 1 %
Lymphocytes Relative: 17 %
Lymphs Abs: 1 10*3/uL (ref 0.7–4.0)
MCH: 31.4 pg (ref 26.0–34.0)
MCHC: 33.5 g/dL (ref 30.0–36.0)
MCV: 93.7 fL (ref 80.0–100.0)
Monocytes Absolute: 0.4 10*3/uL (ref 0.1–1.0)
Monocytes Relative: 7 %
Neutro Abs: 4.1 10*3/uL (ref 1.7–7.7)
Neutrophils Relative %: 72 %
Platelets: 213 10*3/uL (ref 150–400)
RBC: 2.39 MIL/uL — ABNORMAL LOW (ref 4.22–5.81)
RDW: 15.3 % (ref 11.5–15.5)
WBC: 5.7 10*3/uL (ref 4.0–10.5)
nRBC: 0 % (ref 0.0–0.2)

## 2019-04-15 LAB — COMPREHENSIVE METABOLIC PANEL
ALT: 11 U/L (ref 0–44)
AST: 17 U/L (ref 15–41)
Albumin: 1.9 g/dL — ABNORMAL LOW (ref 3.5–5.0)
Alkaline Phosphatase: 65 U/L (ref 38–126)
Anion gap: 11 (ref 5–15)
BUN: 13 mg/dL (ref 8–23)
CO2: 26 mmol/L (ref 22–32)
Calcium: 7 mg/dL — ABNORMAL LOW (ref 8.9–10.3)
Chloride: 100 mmol/L (ref 98–111)
Creatinine, Ser: 4.5 mg/dL — ABNORMAL HIGH (ref 0.61–1.24)
GFR calc Af Amer: 14 mL/min — ABNORMAL LOW (ref >60)
GFR calc non Af Amer: 12 mL/min — ABNORMAL LOW (ref >60)
Glucose, Bld: 148 mg/dL — ABNORMAL HIGH (ref 70–99)
Potassium: 3.4 mmol/L — ABNORMAL LOW (ref 3.5–5.1)
Sodium: 137 mmol/L (ref 135–145)
Total Bilirubin: 0.7 mg/dL (ref 0.3–1.2)
Total Protein: 4.4 g/dL — ABNORMAL LOW (ref 6.5–8.1)

## 2019-04-15 LAB — GLUCOSE, CAPILLARY
Glucose-Capillary: 114 mg/dL — ABNORMAL HIGH (ref 70–99)
Glucose-Capillary: 191 mg/dL — ABNORMAL HIGH (ref 70–99)
Glucose-Capillary: 153 mg/dL — ABNORMAL HIGH (ref 70–99)
Glucose-Capillary: 231 mg/dL — ABNORMAL HIGH (ref 70–99)

## 2019-04-15 LAB — PHOSPHORUS: Phosphorus: 2.9 mg/dL (ref 2.5–4.6)

## 2019-04-15 LAB — MAGNESIUM: Magnesium: 1.4 mg/dL — ABNORMAL LOW (ref 1.7–2.4)

## 2019-04-15 MED ORDER — HEPARIN SODIUM (PORCINE) 1000 UNIT/ML IJ SOLN
INTRAMUSCULAR | Status: AC
  Administered 2019-04-15: 3400 [IU]
  Filled 2019-04-15: qty 4

## 2019-04-15 MED ORDER — MAGNESIUM SULFATE 2 GM/50ML IV SOLN
2.0000 g | Freq: Once | INTRAVENOUS | Status: DC
  Filled 2019-04-15: qty 50

## 2019-04-15 NOTE — Progress Notes (Signed)
Renal Navigator received call from CSW/C. Wynetta Emery stating patient has been accepted at [facility] if he can be accepted for OP HD at [facility] 11:00am or [facility] 10:00am. Per Quarry manager at [facility], they have a MWF seat at 12:00pm, which Ronney Lion is able to accommodate, per CSW.  Renal Navigator informed Clinic Manager that referral is being completed and requested hold on this seat. Referral sent to [facility] for OP HD treatment at [facility] clinic while patient is at [facility] for short term rehab. Patient has been on TTS while in the hospital. Renal Navigator spoke with Dr. Marval Regal and inpatient HD unit Charge RN about a short HD treatment today to get patient on schedule while referral is being reviewed in hopes that patient can discharge to SNF tomorrow and start in clinic on Friday-both in agreement. Navigator discussed plan with CSW and Attending-also in agreement. Renal Navigator to meet with patient. Per CSW, [facility] can accept patient tomorrow, 04/16/19. Renal Navigator awaiting acceptance from [facility]. Renal Navigator to follow closely.  Alphonzo Cruise, [place] Renal Navigator [phone]

## 2019-04-15 NOTE — Progress Notes (Signed)
PT Cancellation Note  Patient Details Name: Kyle [name] MRN: [id number] DOB: 1951/01/05   Cancelled Treatment:    Reason Eval/Treat Not Completed: Patient at procedure or test/unavailable.  Pt gone to procedure on arrival.  Will get back if able. 04/15/2019  Ginger Carne., PT Acute Rehabilitation Services [phone]  (pager) [phone]  (office)   Tessie Fass Michaelene Dutan 04/15/2019, 4:01 PM

## 2019-04-15 NOTE — Progress Notes (Signed)
Patient ID: Arshan Jabs, male   DOB: 04-09-1950, 69 y.o.   MRN: [id number] S: Feels well, no complaints O:BP (!) 144/59 (BP Location: Right Arm)   Pulse 67   Temp 98.2 F (36.8 C) (Oral)   Resp 15   Wt 91.6 kg   SpO2 98%   BMI 27.39 kg/m   Intake/Output Summary (Last 24 hours) at 04/15/2019 1325 Last data filed at 04/15/2019 1000 Gross per 24 hour  Intake 560 ml  Output 1977 ml  Net -1417 ml   Intake/Output: I/O last 3 completed shifts: In: 360 [P.O.:360] Out: 1977 [Other:1976; Stool:1]  Intake/Output this shift:  Total I/O In: 200 [P.O.:200] Out: -  Weight change:  Gen: NAD CVS: no rub Resp: cta Abd: benign Ext: no edema, LUE AVF +T/B  Recent Labs  Lab 04/09/19 0407 04/10/19 0229 04/11/19 0526 04/12/19 0436 04/13/19 0620 04/14/19 0528 04/15/19 0442  NA 140 137 139 138 139 139 137  K 4.2 3.4* 3.6 4.0 3.8 3.6 3.4*  CL 104 102 105 103 102 105 100  CO2 23 26 25 24 25 23 26   GLUCOSE 112* 179* 135* 136* 126* 124* 148*  BUN 22 8 16 12 23  28* 13  CREATININE 5.50* 3.32* 5.42* 4.02* 5.82* 7.30* 4.50*  ALBUMIN 2.2* 2.1* 2.1* 2.1* 2.0* 2.0* 1.9*  CALCIUM 7.4* 7.3* 7.3* 6.9* 6.7* 6.5* 7.0*  PHOS 2.9 2.1* 2.9 1.8* 3.5 3.7 2.9  AST 21 19 17 18 17 15 17   ALT 15 13 13 12 11 11 11    Liver Function Tests: Recent Labs  Lab 04/13/19 0620 04/14/19 0528 04/15/19 0442  AST 17 15 17   ALT 11 11 11   ALKPHOS 64 64 65  BILITOT 0.5 0.7 0.7  PROT 4.3* 4.3* 4.4*  ALBUMIN 2.0* 2.0* 1.9*   No results for input(s): LIPASE, AMYLASE in the last 168 hours. No results for input(s): AMMONIA in the last 168 hours. CBC: Recent Labs  Lab 04/11/19 0526 04/11/19 0526 04/12/19 0436 04/12/19 0436 04/13/19 0620 04/14/19 0528 04/15/19 0442  WBC 6.7   < > 7.4   < > 7.6 6.8 5.7  NEUTROABS 4.7   < > 5.4   < > 5.7 5.0 4.1  HGB 8.6*   < > 8.6*   < > 8.1* 7.8* 7.5*  HCT 26.2*   < > 25.7*   < > 24.3* 23.8* 22.4*  MCV 94.6  --  93.5  --  93.1 93.7 93.7  PLT 192   < > 190   < > 206  221 213   < > = values in this interval not displayed.   Cardiac Enzymes: No results for input(s): CKTOTAL, CKMB, CKMBINDEX, TROPONINI in the last 168 hours. CBG: Recent Labs  Lab 04/14/19 1111 04/14/19 1718 04/14/19 2113 04/15/19 0738 04/15/19 1116  GLUCAP 174* 105* 282* 114* 191*    Iron Studies: No results for input(s): IRON, TIBC, TRANSFERRIN, FERRITIN in the last 72 hours. Studies/Results: No results found. . sodium chloride   Intravenous Once  . amLODipine  2.5 mg Oral Daily  . atorvastatin  80 mg Oral Daily  . calcium carbonate  400 mg of elemental calcium Oral QHS  . chlorhexidine  15 mL Mouth Rinse BID  . Chlorhexidine Gluconate Cloth  6 each Topical Daily  . clopidogrel  75 mg Oral Daily  . darbepoetin (ARANESP) injection - DIALYSIS  100 mcg Intravenous Q Sat-HD  . feeding supplement (NEPRO CARB STEADY)  237 mL Oral BID BM  .  feeding supplement (PRO-STAT SUGAR FREE 64)  30 mL Oral BID  . gentamicin cream  1 application Topical Daily  . heparin injection (subcutaneous)  5,000 Units Subcutaneous Q8H  . insulin aspart  0-15 Units Subcutaneous TID WC  . levETIRAcetam  500 mg Oral BID  . levETIRAcetam  500 mg Oral Q T,Th,Sa-HD  . levothyroxine  200 mcg Oral q morning - 10a  . lipase/protease/amylase  24,000 Units Oral TID  . losartan  50 mg Oral Daily  . metoprolol succinate  50 mg Oral Daily  . multivitamin  1 tablet Oral QHS  . naloxone  0.4 mg Intramuscular Once  . pantoprazole  40 mg Oral BID  . traZODone  50 mg Oral QHS    BMET    Component Value Date/Time   NA 137 04/15/2019 0442   NA 138 08/08/2013 1947   K 3.4 (L) 04/15/2019 0442   K 3.9 08/08/2013 1947   CL 100 04/15/2019 0442   CL 107 08/08/2013 1947   CO2 26 04/15/2019 0442   CO2 21 08/08/2013 1947   GLUCOSE 148 (H) 04/15/2019 0442   GLUCOSE 100 (H) 08/08/2013 1947   BUN 13 04/15/2019 0442   BUN 32 (H) 08/08/2013 1947   CREATININE 4.50 (H) 04/15/2019 0442   CREATININE 2.04 (H) 08/08/2013  1947   CALCIUM 7.0 (L) 04/15/2019 0442   CALCIUM 9.1 08/08/2013 1947   GFRNONAA 12 (L) 04/15/2019 0442   GFRNONAA 33 (L) 08/08/2013 1947   GFRAA 14 (L) 04/15/2019 0442   GFRAA 38 (L) 08/08/2013 1947   CBC    Component Value Date/Time   WBC 5.7 04/15/2019 0442   RBC 2.39 (L) 04/15/2019 0442   HGB 7.5 (L) 04/15/2019 0442   HGB 14.2 08/08/2013 1947   HCT 22.4 (L) 04/15/2019 0442   HCT 42.1 08/08/2013 1947   PLT 213 04/15/2019 0442   PLT 144 (L) 08/08/2013 1947   MCV 93.7 04/15/2019 0442   MCV 92 08/08/2013 1947   MCH 31.4 04/15/2019 0442   MCHC 33.5 04/15/2019 0442   RDW 15.3 04/15/2019 0442   RDW 12.9 08/08/2013 1947   LYMPHSABS 1.0 04/15/2019 0442   MONOABS 0.4 04/15/2019 0442   EOSABS 0.1 04/15/2019 0442   BASOSABS 0.1 04/15/2019 0442    Assessment/Plan:  1. ESRD- transitioned from PD to HD following his stroke.  He has been accepted to outpateint HD at [facility] on MWF schedule to accommodate SNF transportation so will plan for short HD session today and then follow up as an outpatient on Friday. 2. 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 3. Acute pontine stroke- continued rehab 4. HTN- stable 5. DM- per primary 6. Intermittent agitation- improving 7. Vascular access- s/p LUE AVF placed 03/30/19 8. Disposition- for possible discharge to SNF tomorrow and will need to switch to MWF schedule.  Plan for short HD session today and discharge tentatively tomorrow and first outpatient on Friday 04/17/19. Donetta Potts, MD [name] [phone]

## 2019-04-15 NOTE — Progress Notes (Signed)
Physical Therapy Treatment Patient Details Name: Kyle [name] MRN: [id number] DOB: Jul 01, 1950 Today's Date: 04/15/2019    History of Present Illness The patient is a 69 year old Caucasian Male who was Patient was discharged from [facility] on 1/21 to CIR at [facility] cone after being treated for acute left midbrain stroke. He subsequently developed GI bleed  (acute onset of black and red stools X 5 overnight and also vomited coffee grounds one time while at rehab) and acute blood loss anemia and was transferred from Rehab and admitted to [facility] cone on 2/6. He has ESRD and is being dialyzed during his hospitalization Tuesdays Thursdays and Saturdays.   Nephrology and GI following: And the plan was for colonoscopy 04/08/19 however patient refused prep and has been refusing labwork and intervention and became combative and rude to staff. Because of this we consulted Neurology, Psychiatry, and repeat an MRI of the brain and EEG.   Pt has begun prep 2/11, colonoscopy pending.    PT Comments    Pt just back from a short HD session.  He is willing to work on gait for a short time.  Today as usual, pt starts out with minor bias to the R, but as he ambulates further or for a longer time he does fatigue and starts listing moderately to the R, unable to maintain balance without increasingly more assist to help w/shift back L toward midline.   Follow Up Recommendations      Equipment Recommendations  Other (comment)    Recommendations for Other Services       Precautions / Restrictions Precautions Precautions: Fall    Mobility  Bed Mobility Overal bed mobility: Needs Assistance Bed Mobility: Supine to Sit     Supine to sit: Min guard     General bed mobility comments: 3 attempts to build momentum before getting trunk up without assist  Transfers Overall transfer level: Needs assistance Equipment used: Rolling walker (2 wheeled) Transfers: Sit to/from Stand Sit to Stand: Min assist          General transfer comment: cues to get pt to lean/translate trunk more forward more than boost.  Ambulation/Gait Ambulation/Gait assistance: Min assist;Mod assist Gait Distance (Feet): 180 Feet Assistive device: Rolling walker (2 wheeled) Gait Pattern/deviations: Step-through pattern Gait velocity: decreased Gait velocity interpretation: <1.8 ft/sec, indicate of risk for recurrent falls General Gait Details: initially able to stand in midline, but once he starts ambulation he starts listing right in the RW more, biased more to the R side of the RW and needs more assist to w/shift to the L.  Today pt needed even more assist due to pt up longer and ambulated further.   Pt cannot maintain balance during gait without assist, even with the RW   Stairs             Wheelchair Mobility    Modified Rankin (Stroke Patients Only) Modified Rankin (Stroke Patients Only) Pre-Morbid Rankin Score: No symptoms Modified Rankin: Moderately severe disability     Balance Overall balance assessment: Needs assistance Sitting-balance support: No upper extremity supported Sitting balance-Leahy Scale: Fair       Standing balance-Leahy Scale: Poor Standing balance comment: reliant on AD or external support.  Tends to list Right more than posteriorly and cannot return to midline or hold without minimal assist at the least.  Cognition Arousal/Alertness: Awake/alert Behavior During Therapy: WFL for tasks assessed/performed Overall Cognitive Status: Within Functional Limits for tasks assessed                                        Exercises      General Comments        Pertinent Vitals/Pain Pain Assessment: Faces Faces Pain Scale: No hurt Pain Intervention(s): Monitored during session    Home Living                      Prior Function            PT Goals (current goals can now be found in the care plan section) Acute  Rehab PT Goals Patient Stated Goal: still need to get better before home PT Goal Formulation: With patient Time For Goal Achievement: 04/23/19 Potential to Achieve Goals: Good Progress towards PT goals: Progressing toward goals    Frequency    Min 3X/week      PT Plan Current plan remains appropriate    Co-evaluation              AM-PAC PT "6 Clicks" Mobility   Outcome Measure  Help needed turning from your back to your side while in a flat bed without using bedrails?: A Little Help needed moving from lying on your back to sitting on the side of a flat bed without using bedrails?: A Little Help needed moving to and from a bed to a chair (including a wheelchair)?: A Little Help needed standing up from a chair using your arms (e.g., wheelchair or bedside chair)?: A Little Help needed to walk in hospital room?: A Little Help needed climbing 3-5 steps with a railing? : A Lot 6 Click Score: 17    End of Session   Activity Tolerance: Patient tolerated treatment well;Patient limited by fatigue Patient left: in chair;with call bell/phone within reach Nurse Communication: Mobility status PT Visit Diagnosis: Unsteadiness on feet (R26.81);Other abnormalities of gait and mobility (R26.89) Hemiplegia - dominant/non-dominant: Dominant Hemiplegia - caused by: Cerebral infarction     Time: 7948-0165 PT Time Calculation (min) (ACUTE ONLY): 19 min  Charges:  $Gait Training: 8-22 mins                     04/15/2019  Ginger Carne., PT Acute Rehabilitation Services [phone]  (pager) [phone]  (office)   Tessie Fass Sigfredo Schreier 04/15/2019, 4:49 PM

## 2019-04-15 NOTE — Progress Notes (Addendum)
PROGRESS NOTE  Kyle [name] EHU:[id number] DOB: Jul 11, 1950 DOA: 04/04/2019 PCP: Idelle Crouch, MD  Brief History   69 year old man recent hospitalization for acute left midbrain stroke with subsequent transfer to CIR in January, subsequently developed GI bleed and acute blood loss anemia and was admitted to [facility] 2/6.  Underwent colonoscopy which showed single nonbleeding colonic angiodysplastic lesion that was treated with monopolar probe.  Diverticulosis also seen.  Restarted on Plavix, PPI continued.  Previously on peritoneal dialysis, changed to hemodialysis this admission.  Awaits [facility] for outpatient hemodialysis.  Currently no SNF offers preventing discharge.  A & P  Acute GI bleed, acute blood loss anemia, thought secondary to colonic angiodysplasia seen on colonoscopy and treated with monopolar probe.  EGD showed no bleeding source but did show duodenitis and gastritis.  Required transfusion 2 units PRBC. --Continue PPI.  --Hgb stable  Hypomagnesemia --Replace  Essential hypertension --appears stable. Continue amlodipine, metoprolol, Cozaar  CVA January 2021, with residual difficulty with balance and ptosis left eyelid.  Thought secondary to small vessel disease. --Continue Plavix as per neurology recommendation. --Continue atorvastatin  Seizure disorder.  EEG reported as normal. --appears stable. Continue Keppra  Diabetes mellitus type 2 --CBG stable.  Continue sliding scale insulin.  Was on Lantus 5 units prior to admission as well >> scale.  ESRD, on peritoneal dialysis prior to admission, transition to hemodialysis 2/2. --Awaiting outpatient HD placement  Hypothyroidism --Continue levothyroxine  Stop telemetry  Resolved Hospital Problem list    Acute metabolic encephalopathy   DVT prophylaxis: heparin Code Status: Full Family Communication: none Disposition Plan: SNF planned with bed available and CLIP complete   Murray Hodgkins, MD  [facility] Direct contact: see [email or web address] (further directions at bottom of note if needed) 7PM-7AM contact night coverage as at bottom of note 04/15/2019, 11:16 AM  LOS: 11 days   Significant Hospital Events   .    Consults:  .    Procedures:  .   Significant Diagnostic Tests:  [name]    Micro Data:  .    Antimicrobials:  .   Interval History/Subjective  Reports some dizziness.  Objective   Vitals:  Vitals:   04/15/19 0534 04/15/19 0740  BP:  (!) 142/58  Pulse:  66  Resp:  16  Temp: 98.2 F (36.8 C) 98 F (36.7 C)  SpO2:  99%    Exam:  Constitutional.  Appears calm, comfortable. Eyes.  Left ptosis noted. ENT.  Grossly normal hearing. Respiratory.  Clear to auscultation bilaterally.  No wheezes, rales or rhonchi.  Normal respiratory effort. Cardiovascular.  Regular rate and rhythm.  No murmur, rub or gallop.  No significant lower extremity edema. Telemetry SR.  Psychiatric.  Grossly normal mood and affect.  Speech fluent and appropriate.  I have personally reviewed the following:   Today's Data  . CBG stable . Potassium 3.4, remainder BMP reviewed.  Magnesium 1.4.  LFTs notable for hypoalbuminemia. . Hemoglobin slightly lower at 7.5.  Scheduled Meds: . sodium chloride   Intravenous Once  . amLODipine  2.5 mg Oral Daily  . atorvastatin  80 mg Oral Daily  . calcium carbonate  400 mg of elemental calcium Oral QHS  . chlorhexidine  15 mL Mouth Rinse BID  . Chlorhexidine Gluconate Cloth  6 each Topical Daily  . clopidogrel  75 mg Oral Daily  . darbepoetin (ARANESP) injection - DIALYSIS  100 mcg Intravenous Q Sat-HD  . feeding supplement (NEPRO CARB STEADY)  237 mL Oral  BID BM  . feeding supplement (PRO-STAT SUGAR FREE 64)  30 mL Oral BID  . gentamicin cream  1 application Topical Daily  . heparin injection (subcutaneous)  5,000 Units Subcutaneous Q8H  . insulin aspart  0-15 Units Subcutaneous TID WC  . levETIRAcetam  500 mg Oral BID  . levETIRAcetam  500 mg  Oral Q T,Th,Sa-HD  . levothyroxine  200 mcg Oral q morning - 10a  . lipase/protease/amylase  24,000 Units Oral TID  . losartan  50 mg Oral Daily  . metoprolol succinate  50 mg Oral Daily  . multivitamin  1 tablet Oral QHS  . naloxone  0.4 mg Intramuscular Once  . pantoprazole  40 mg Oral BID  . traZODone  50 mg Oral QHS   Continuous Infusions: . magnesium sulfate bolus IVPB      Principal Problem:   GI bleed Active Problems:   Chronic obstructive pulmonary disease (HCC)   Seizure (HCC)   Insulin dependent type 2 diabetes mellitus ([place])   ESRD (end stage renal disease) ([place])   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[place])   LOS: 11 days   How to contact the [facility] Attending or Consulting provider [place] or covering provider during after hours [place], for this patient?  1. Check the care team in [facility] and look for a) attending/consulting TRH provider listed and b) the [facility] team listed 2. Log into [email or web address] and use [place]'s universal password to access. If you do not have the password, please contact the hospital operator. 3. Locate the [facility] provider you are looking for under [facility] and page to a number that you can be directly reached. 4. If you still have difficulty reaching the provider, please page the [facility] (Director on Call) for the Hospitalists listed on amion for assistance.

## 2019-04-15 NOTE — TOC Progression Note (Signed)
Transition of Care [facility]) - Progression Note    Patient Details  Name: Marico Buckle MRN: [id number] Date of Birth: August 04, 1950  Transition of Care [facility]) CM/SW [place], [place] Phone Number: 04/15/2019, 12:02 PM  Clinical Narrative:     CSW spoke with the patient's wife,[name] and confirmed bed offer with [facility].   HD Coordinator/Colleen is working on setting up the patient with HD outpatient clinic. She will inform CSW once the process is complete.  CSW has informed [place] of anticipated discharge tomorrow. RN updated and covid test requested.  CSW will continue to follow.  Thurmond Butts, MSW, [place] Clinical Social Worker    Expected Discharge Plan: Skilled Nursing Facility Barriers to Discharge: Continued Medical Work up  Expected Discharge Plan and Services Expected Discharge Plan: [place] In-house Referral: Clinical Social Work     Living arrangements for the past 2 months: Single Family Home                                       Social Determinants of Health (SDOH) Interventions    Readmission Risk Interventions No flowsheet data found.

## 2019-04-16 LAB — GLUCOSE, CAPILLARY
Glucose-Capillary: 130 mg/dL — ABNORMAL HIGH (ref 70–99)
Glucose-Capillary: 203 mg/dL — ABNORMAL HIGH (ref 70–99)

## 2019-04-16 LAB — SARS CORONAVIRUS 2 (TAT 6-24 HRS): SARS Coronavirus 2: NEGATIVE

## 2019-04-16 MED ORDER — PANTOPRAZOLE SODIUM 40 MG PO TBEC: 40.0000 mg | DELAYED_RELEASE_TABLET | Freq: Two times a day (BID) | ORAL | 0 refills | Status: DC

## 2019-04-16 MED ORDER — LOSARTAN POTASSIUM 50 MG PO TABS: 50.0000 mg | ORAL_TABLET | Freq: Every day | ORAL | 0 refills | Status: DC

## 2019-04-16 MED ORDER — CLOPIDOGREL BISULFATE 75 MG PO TABS: 75.0000 mg | ORAL_TABLET | Freq: Every day | ORAL | 0 refills | Status: AC

## 2019-04-16 MED ORDER — METOPROLOL SUCCINATE ER 50 MG PO TB24: 50.0000 mg | ORAL_TABLET | Freq: Every day | ORAL | 0 refills | Status: DC

## 2019-04-16 NOTE — Progress Notes (Signed)
Physical Therapy Treatment Patient Details Name: Kyle [name] MRN: [id number] DOB: 11-Jul-1950 Today's Date: 04/16/2019    History of Present Illness The patient is a 69 year old Caucasian Male who was Patient was discharged from [facility] on 1/21 to CIR at [facility] after being treated for acute left midbrain stroke. He subsequently developed GI bleed  (acute onset of black and red stools X 5 overnight and also vomited coffee grounds one time while at rehab) and acute blood loss anemia and was transferred from Rehab and admitted to [facility] on 2/6. He has ESRD and is being dialyzed during his hospitalization Tuesdays Thursdays and Saturdays.   Nephrology and GI following: And the plan was for colonoscopy 04/08/19 however patient refused prep and has been refusing labwork and intervention and became combative and rude to staff. Because of this we consulted Neurology, Psychiatry, and repeat an MRI of the brain and EEG.   Pt has begun prep 2/11, colonoscopy pending.    PT Comments    Continue to work on w/shifting, midline stance and midline balance during dynamic tasks/gait.  Pt's balance invariably degrades with time up or distance ambulated.   Follow Up Recommendations  SNF     Equipment Recommendations  Other (comment)    Recommendations for Other Services       Precautions / Restrictions Precautions Precautions: Fall    Mobility  Bed Mobility Overal bed mobility: Needs Assistance Bed Mobility: Supine to Sit     Supine to sit: Min assist     General bed mobility comments: 2attempts to build momentum but finally needed minimal assist to get up fully onto L UE.  Transfers Overall transfer level: Needs assistance Equipment used: Rolling walker (2 wheeled) Transfers: Sit to/from Stand Sit to Stand: Min assist         General transfer comment: cues to get pt to lean/translate trunk more forward more than boost.  Ambulation/Gait Ambulation/Gait assistance: Min  assist;Mod assist Gait Distance (Feet): 120 Feet Assistive device: Rolling walker (2 wheeled) Gait Pattern/deviations: Step-through pattern Gait velocity: decreased   General Gait Details: initially able to stand in midline, but once he starts ambulation he starts listing right in the RW more, biased more to the R side of the RW and needs more assist to w/shift to the L.    Spent time guarding and letting pt fight to regain balance and w/shift L, but in the end pt unable to right himself.   Stairs             Wheelchair Mobility    Modified Rankin (Stroke Patients Only) Modified Rankin (Stroke Patients Only) Pre-Morbid Rankin Score: No symptoms Modified Rankin: Moderately severe disability     Balance Overall balance assessment: Needs assistance Sitting-balance support: No upper extremity supported Sitting balance-Leahy Scale: Fair       Standing balance-Leahy Scale: Poor Standing balance comment: reliant on AD or external support.  Tends to list Right more than posteriorly and cannot return to midline or hold without minimal assist at the least.                            Cognition Arousal/Alertness: Awake/alert Behavior During Therapy: [facility] for tasks assessed/performed Overall Cognitive Status: Within Functional Limits for tasks assessed  Exercises      General Comments        Pertinent Vitals/Pain Pain Assessment: No/denies pain Pain Score: 0-No pain Faces Pain Scale: No hurt    Home Living                      Prior Function            PT Goals (current goals can now be found in the care plan section) Acute Rehab PT Goals Patient Stated Goal: still need to get better before home PT Goal Formulation: With patient Time For Goal Achievement: 04/23/19 Potential to Achieve Goals: Good    Frequency    Min 3X/week      PT Plan Current plan remains appropriate     Co-evaluation              AM-PAC PT "6 Clicks" Mobility   Outcome Measure  Help needed turning from your back to your side while in a flat bed without using bedrails?: A Little Help needed moving from lying on your back to sitting on the side of a flat bed without using bedrails?: A Little Help needed moving to and from a bed to a chair (including a wheelchair)?: A Little Help needed standing up from a chair using your arms (e.g., wheelchair or bedside chair)?: A Little Help needed to walk in hospital room?: A Little Help needed climbing 3-5 steps with a railing? : A Lot 6 Click Score: 17    End of Session   Activity Tolerance: Patient tolerated treatment well;Patient limited by fatigue Patient left: in chair;with call bell/phone within reach Nurse Communication: Mobility status PT Visit Diagnosis: Unsteadiness on feet (R26.81);Other abnormalities of gait and mobility (R26.89) Hemiplegia - dominant/non-dominant: Dominant Hemiplegia - caused by: Cerebral infarction     Time: 2010-0712 PT Time Calculation (min) (ACUTE ONLY): 18 min  Charges:  $Gait Training: 8-22 mins                     04/16/2019  Ginger Carne., PT Acute Rehabilitation Services [phone]  (pager) [phone]  (office)   Tessie Fass Jessalyn Hinojosa 04/16/2019, 2:52 PM

## 2019-04-16 NOTE — Progress Notes (Signed)
PROGRESS NOTE  Kyle [name] NFA:[id number] DOB: [birth year] DOA: 04/04/2019 PCP: Idelle Crouch, MD  HPI/Recap of past 24 hours: 69 year old man recent hospitalization for acute left midbrain stroke with subsequent transfer to CIR in January, subsequently developed GI bleed and acute blood loss anemia and was admitted to [facility] 2/6.  Underwent colonoscopy which showed single nonbleeding colonic angiodysplastic lesion that was treated with monopolar probe.  Diverticulosis also seen.  Restarted on Plavix, PPI continued.  Previously on peritoneal dialysis, changed to hemodialysis this admission.  Awaits CLIP for outpatient hemodialysis.    Nephrology navigator assisting, greatly appreciated.  04/16/19: Seen and examined.  He has no new complaints.  Eager to be discharged.  Awaiting HD spot confirmation prior to DC.    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  EGD showed no bleeding source but did show duodenitis and gastritis.  Required transfusion 2 units PRBC. --Continue PPI.  --Hgb stable  Hypomagnesemia --Replaced  Essential hypertension --appears stable. Continue amlodipine, metoprolol, Cozaar  CVA January 2021, with residual difficulty with balance and ptosis left eyelid.  Thought secondary to small vessel disease. --Continue Plavix as per neurology recommendation. --Continue atorvastatin  Seizure disorder.  EEG reported as normal. --appears stable. Continue Keppra  Diabetes mellitus type 2 --CBG stable.  Continue sliding scale insulin.  Was on Lantus 5 units prior to admission as well >> scale.  ESRD, on peritoneal  dialysis prior to admission, transition to hemodialysis 2/2. --Awaiting outpatient HD placement  Hypothyroidism --Continue levothyroxine    Resolved Hospital Problem list   Acute metabolic encephalopathy   DVT prophylaxis: heparin sq TID Code Status: Full Family Communication: none Disposition Plan: Patient is from home.  Anticipate discharge to SNF once HD spot is confirmed.  Barrier to discharge: HD spot.       Objective: Vitals:   04/15/19 2048 04/16/19 0335 04/16/19 0746 04/16/19 1152  BP: 137/74 (!) 145/59 (!) 144/67 136/65  Pulse: 70 65 67 70  Resp: 16 16 12 13   Temp: 98.3 F (36.8 C) 97.8 F (36.6 C) 98.7 F (37.1 C) (!) 97.1 F (36.2 C)  TempSrc: Oral Oral Axillary Axillary  SpO2: 99% 98% 98% 99%  Weight:        Intake/Output Summary (Last 24 hours) at 04/16/2019 1219 Last data filed at 04/16/2019 0800 Gross per 24 hour  Intake 250 ml  Output 1000 ml  Net -750 ml   Filed Weights   04/14/19 1631 04/15/19 1332 04/15/19 1536  Weight: 91.6 kg 91.9 kg 90.3 kg    Exam:  . General: 69 y.o. year-old male well developed well nourished in no acute distress.  Alert and interactive. . Cardiovascular: Regular rate and rhythm with no rubs or gallops.  No thyromegaly or JVD noted.   [name] Respiratory: Clear to auscultation with no wheezes or rales. Good inspiratory effort. . Abdomen: Soft nontender nondistended with normal bowel sounds x4 quadrants. . Musculoskeletal: No lower extremity edema. 2/4 pulses in all 4 extremities. [name] Psychiatry: Mood is appropriate for condition and setting   Data Reviewed: CBC: Recent Labs  Lab 04/11/19 0526 04/12/19 0436 04/13/19 0620 04/14/19 0528 04/15/19 0442  WBC  6.7 7.4 7.6 6.8 5.7  NEUTROABS 4.7 5.4 5.7 5.0 4.1  HGB 8.6* 8.6* 8.1* 7.8* 7.5*  HCT 26.2* 25.7* 24.3* 23.8* 22.4*  MCV 94.6 93.5 93.1 93.7 93.7  PLT 192 190 206 221 213   Basic Metabolic Panel: Recent Labs  Lab 04/11/19 0526 04/12/19 0436  04/13/19 0620 04/14/19 0528 04/15/19 0442  NA 139 138 139 139 137  K 3.6 4.0 3.8 3.6 3.4*  CL 105 103 102 105 100  CO2 25 24 25 23 26   GLUCOSE 135* 136* 126* 124* 148*  BUN 16 12 23  28* 13  CREATININE 5.42* 4.02* 5.82* 7.30* 4.50*  CALCIUM 7.3* 6.9* 6.7* 6.5* 7.0*  MG 1.9 1.6* 1.9 1.7 1.4*  PHOS 2.9 1.8* 3.5 3.7 2.9   GFR: Estimated Creatinine Clearance: 16.8 mL/min (A) (by C-G formula based on SCr of 4.5 mg/dL (H)). Liver Function Tests: Recent Labs  Lab 04/11/19 0526 04/12/19 0436 04/13/19 0620 04/14/19 0528 04/15/19 0442  AST 17 18 17 15 17   ALT 13 12 11 11 11   ALKPHOS 59 60 64 64 65  BILITOT 0.6 0.7 0.5 0.7 0.7  PROT 4.7* 4.6* 4.3* 4.3* 4.4*  ALBUMIN 2.1* 2.1* 2.0* 2.0* 1.9*   No results for input(s): LIPASE, AMYLASE in the last 168 hours. No results for input(s): AMMONIA in the last 168 hours. Coagulation Profile: No results for input(s): INR, PROTIME in the last 168 hours. Cardiac Enzymes: No results for input(s): CKTOTAL, CKMB, CKMBINDEX, TROPONINI in the last 168 hours. BNP (last 3 results) No results for input(s): PROBNP in the last 8760 hours. HbA1C: No results for input(s): HGBA1C in the last 72 hours. CBG: Recent Labs  Lab 04/15/19 1116 04/15/19 1615 04/15/19 2205 04/16/19 0745 04/16/19 1151  GLUCAP 191* 153* 231* 130* 203*   Lipid Profile: No results for input(s): CHOL, HDL, LDLCALC, TRIG, CHOLHDL, LDLDIRECT in the last 72 hours. Thyroid Function Tests: No results for input(s): TSH, T4TOTAL, FREET4, T3FREE, THYROIDAB in the last 72 hours. Anemia Panel: No results for input(s): VITAMINB12, FOLATE, FERRITIN, TIBC, IRON, RETICCTPCT in the last 72 hours. Urine analysis:    Component Value Date/Time   COLORURINE YELLOW 03/22/2019 1622   APPEARANCEUR CLEAR 03/22/2019 1622   APPEARANCEUR Clear 08/08/2013 1947   LABSPEC 1.018 03/22/2019 1622   LABSPEC 1.012 08/08/2013 1947   PHURINE 5.0 03/22/2019 1622   GLUCOSEU >=500 (A) 03/22/2019 1622    GLUCOSEU 50 mg/dL 08/08/2013 1947   HGBUR NEGATIVE 03/22/2019 [place] 03/22/2019 1622   BILIRUBINUR Negative 08/08/2013 1947   KETONESUR NEGATIVE 03/22/2019 1622   PROTEINUR >=300 (A) 03/22/2019 1622   NITRITE NEGATIVE 03/22/2019 1622   LEUKOCYTESUR NEGATIVE 03/22/2019 1622   LEUKOCYTESUR Negative 08/08/2013 1947   Sepsis Labs: @LABRCNTIP (procalcitonin:4,lacticidven:4)  ) Recent Results (from the past 240 hour(s))  Culture, blood (routine x 2)     Status: None   Collection Time: 04/08/19  4:12 PM   Specimen: BLOOD  Result Value Ref Range Status   Specimen Description BLOOD RIGHT ANTECUBITAL  Final   Special Requests   Final    BOTTLES DRAWN AEROBIC AND ANAEROBIC Blood Culture adequate volume   Culture   Final    NO GROWTH 5 DAYS Performed at [facility], [place] [address] [place], [place] [address]    Report Status 04/13/2019 FINAL  Final  Culture, blood (routine x 2)     Status: None   Collection Time: 04/08/19  4:12 PM   Specimen: BLOOD RIGHT HAND  Result Value Ref Range Status   Specimen Description BLOOD RIGHT HAND  Final   Special Requests   Final    BOTTLES DRAWN AEROBIC AND ANAEROBIC Blood Culture adequate volume   Culture   Final    NO GROWTH 5 DAYS Performed at [facility], [address], [place], [place] [address]    Report Status 04/13/2019 FINAL  Final  SARS CORONAVIRUS 2 (TAT 6-24 HRS) Nasopharyngeal Nasopharyngeal Swab     Status: None   Collection Time: 04/15/19  7:47 PM   Specimen: Nasopharyngeal Swab  Result Value Ref Range Status   SARS Coronavirus 2 NEGATIVE NEGATIVE Final    Comment: (NOTE) SARS-CoV-2 target nucleic acids are NOT DETECTED. The SARS-CoV-2 RNA is generally detectable in upper and lower respiratory specimens during the acute phase of infection. Negative results do not preclude SARS-CoV-2 infection, do not rule out co-infections with other pathogens, and should not be used as the sole basis for  treatment or other patient management decisions. Negative results must be combined with clinical observations, patient history, and epidemiological information. The expected result is Negative. Fact Sheet for Patients: SugarRoll.be Fact Sheet for Healthcare Providers: https://www.woods-mathews.com/ This test is not yet approved or cleared by the [place] FDA and  has been authorized for detection and/or diagnosis of SARS-CoV-2 by FDA under an Emergency Use Authorization (EUA). This EUA will remain  in effect (meaning this test can be used) for the duration of the COVID-19 declaration under Section 56 4(b)(1) of the Act, 21 U.S.C. section 360bbb-3(b)(1), unless the authorization is terminated or revoked sooner. Performed at [facility], [place] [address] [place], [place] [address]       Studies: No results found.  Scheduled Meds: . sodium chloride   Intravenous Once  . amLODipine  2.5 mg Oral Daily  . atorvastatin  80 mg Oral Daily  . calcium carbonate  400 mg of elemental calcium Oral QHS  . chlorhexidine  15 mL Mouth Rinse BID  . Chlorhexidine Gluconate Cloth  6 each Topical Daily  . clopidogrel  75 mg Oral Daily  . darbepoetin (ARANESP) injection - DIALYSIS  100 mcg Intravenous Q Sat-HD  . feeding supplement (NEPRO CARB STEADY)  237 mL Oral BID BM  . feeding supplement (PRO-STAT SUGAR FREE 64)  30 mL Oral BID  . gentamicin cream  1 application Topical Daily  . heparin injection (subcutaneous)  5,000 Units Subcutaneous Q8H  . insulin aspart  0-15 Units Subcutaneous TID WC  . levETIRAcetam  500 mg Oral BID  . levETIRAcetam  500 mg Oral Q T,Th,Sa-HD  . levothyroxine  200 mcg Oral q morning - 10a  . lipase/protease/amylase  24,000 Units Oral TID  . losartan  50 mg Oral Daily  . metoprolol succinate  50 mg Oral Daily  . multivitamin  1 tablet Oral QHS  . naloxone  0.4 mg Intramuscular Once  . pantoprazole  40 mg Oral BID  .  traZODone  50 mg Oral QHS    Continuous Infusions: . magnesium sulfate bolus IVPB       LOS: 12 days     Kayleen Memos, MD [facility] Pager [phone]  If 7PM-7AM, please contact night-coverage [email or web address] Password [facility] 04/16/2019, 12:19 PM

## 2019-04-16 NOTE — Discharge Summary (Addendum)
Discharge Summary  Kyle [name] XNT:[id number] DOB: [birth year]  PCP: Idelle Crouch, MD  Admit date: 04/04/2019 Discharge date: 04/16/2019  Time spent: 35 minutes  Recommendations for Outpatient Follow-up:  1. Follow up with GI 2. Follow up with Nephrology, Keep your hemodialysis appointments 3.  Follow up with your PCP 4. Follow up with neurology 5. Take your medications as prescribed 6. Continue PT OT with assistance and fall precautions  Discharge Diagnoses:  Active Hospital Problems   Diagnosis Date Noted  . GI bleed 04/04/2019  . 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 ESRD (end stage renal disease) ([place]) 03/13/2019  . Seizure ([place]) 05/20/2015  . Insulin dependent type 2 diabetes mellitus ([place]) 03/15/2015  . Chronic obstructive pulmonary disease ([place]) 08/08/2013    Resolved Hospital Problems   Diagnosis Date Noted Date Resolved  . Acute upper GI bleed 04/04/2019 04/15/2019  . Diabetes mellitus ([place]) 08/08/2013 04/15/2019    Discharge Condition: Stable   Diet recommendation: Resume previous diet  Vitals:   04/16/19 0746 04/16/19 1152  BP: (!) 144/67 136/65  Pulse: 67 70  Resp: 12 13  Temp: 98.7 F (37.1 C) (!) 97.1 F (36.2 C)  SpO2: 98% 99%    History of present illness:  69 year old man recent hospitalization for acute left midbrain stroke with subsequent transfer to CIR in January, subsequently developed GI bleed and acute blood loss anemia and was admitted to [facility] 2/6. Underwent colonoscopy which showed single nonbleeding colonic angiodysplastic lesion that was treated with monopolar probe. Diverticulosis also seen. Restarted on Plavix, PPI continued. Previously on peritoneal dialysis, changed to hemodialysis this admission. Awaits CLIPfor outpatient hemodialysis.   Nephrology navigator assisting, greatly appreciated.  04/16/19: Seen and examined.  He has no new complaints.  Eager to be discharged. HD spot confirmed by nephrology  navigator.   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 EGD showed no bleeding source but did show duodenitis and gastritis. Required transfusion 2 units PRBC. --Continue PPI. --Hgb stable  Hypomagnesemia --Replaced  Essential hypertension --appears stable.Continue amlodipine, metoprolol, Cozaar  CVA January 2021, with residual difficulty with balance and ptosis left eyelid. Thought secondary to small vessel disease. --Continue Plavix as per neurology recommendation. --Continue atorvastatin  Seizure disorder. EEG reported as normal. --appears stable.Continue Keppra  Diabetes mellitus type 2 --CBG stable. A1C 7.1.Continue sliding scale insulin. Avoid hypoglycemia  ESRD, on peritoneal dialysis prior to admission, transition to hemodialysis 2/2. --Continue outpatient HD.  Arranged by nephrology coordinator.  Hypothyroidism --Continue levothyroxine    Resolved Hospital Problem list   Acute metabolic encephalopathy   Code Status:Full      Discharge Exam: BP 136/65 (BP Location: Left Arm)   Pulse 70   Temp (!) 97.1 F (36.2 C) (Axillary)   Resp 13   Wt 90.3 kg   SpO2 99%   BMI 27.00 kg/m  . General: 69 y.o. year-old male well developed well nourished in no acute distress.  Alert and interactive. . Cardiovascular: Regular rate and rhythm with no rubs or gallops.  No thyromegaly or JVD noted.   [name] Respiratory: Clear to auscultation with no wheezes or rales. Good inspiratory effort. . Abdomen:  Soft nontender nondistended with normal bowel sounds x4 quadrants. . Musculoskeletal: No lower extremity  edema. 2/4 pulses in all 4 extremities. [name] Psychiatry: Mood is appropriate for condition and setting  Discharge Instructions You were cared for by a hospitalist during your hospital stay. If you have any questions about your discharge medications or the care you received while you were in the hospital after you are discharged, you can call the unit and asked to speak with the hospitalist on call if the hospitalist that took care of you is not available. Once you are discharged, your primary care physician will handle any further medical issues. Please note that NO REFILLS for any discharge medications will be authorized once you are discharged, as it is imperative that you return to your primary care physician (or establish a relationship with a primary care physician if you do not have one) for your aftercare needs so that they can reassess your need for medications and monitor your lab values.   Allergies as of 04/16/2019   No Known Allergies     Medication List    STOP taking these medications   aspirin 81 MG EC tablet   calcitRIOL 0.5 MCG capsule Commonly known as: ROCALTROL   gabapentin 100 MG capsule Commonly known as: NEURONTIN   insulin glargine 100 unit/mL Sopn Commonly known as: LANTUS   sodium bicarbonate 650 MG tablet   traZODone 50 MG tablet Commonly known as: DESYREL     TAKE these medications   amLODipine 2.5 MG tablet Commonly known as: NORVASC Take 1 tablet (2.5 mg total) by mouth daily.   atorvastatin 80 MG tablet Commonly known as: Lipitor Take 1 tablet (80 mg total) by mouth daily.   calcium acetate 667 MG capsule Commonly known as: PHOSLO Take 2 capsules (1,334 mg total) by mouth 3 (three) times daily with meals.   clopidogrel 75 MG tablet Commonly known as: PLAVIX Take 1 tablet (75 mg total) by mouth daily. Start taking on: April 17, 2019   feeding supplement (NEPRO CARB STEADY) Liqd Take 237 mLs by mouth 2 (two) times daily between meals.     gentamicin cream 0.1 % Commonly known as: GARAMYCIN Apply 1 application topically daily.   insulin lispro 100 UNIT/ML KwikPen Commonly known as: HUMALOG 2 units subcutaneous injection for sugars 200-250, 3 units for sugars 251-300; 4 units for sugars greater than 301   levETIRAcetam 500 MG tablet Commonly known as: KEPPRA Take 500 mg by mouth 2 (two) times a day.   levothyroxine 200 MCG tablet Commonly known as: SYNTHROID Take 200 mcg by mouth every morning.   losartan 50 MG tablet Commonly known as: COZAAR Take 1 tablet (50 mg total) by mouth daily. Start taking on: April 17, 2019 What changed:   medication strength  how much to take   metoprolol succinate 50 MG 24 hr tablet Commonly known as: TOPROL-XL Take 1 tablet (50 mg total) by mouth daily. Take with or immediately following a meal. Start taking on: April 17, 2019 What changed: additional instructions   multivitamin Tabs tablet Take 1 tablet by mouth at bedtime.   Pancrelipase (Lip-Prot-Amyl) 24000-76000 units Cpep Take 1 capsule by mouth 3 (three) times daily.   pantoprazole 40 MG tablet Commonly known as: PROTONIX Take 1 tablet (40 mg total) by mouth 2 (two) times daily.      No Known Allergies Follow-up Information    Carol Ada, MD. Call in 1 day(s).   Specialty: Gastroenterology Why: please call for a  post hospital follow up appointment. Contact information: [address] [place] [address] [phone]        Idelle Crouch, MD. Call in 1 day(s).   Specialty: Internal Medicine Why: please call for a post hospital follow up appointment Contact information: [address] [place] [address] [facility]. Call in 1 day(s).   Specialty: Neurology Why: please call for a post hospital follow up appointment. Contact information: [address] [place] [phone]           The results of significant diagnostics from this hospitalization (including imaging, microbiology, ancillary and laboratory) are listed below for reference.    Significant Diagnostic Studies: X-ray chest PA or AP  Result Date: 03/30/2019 CLINICAL DATA:  Postop right dialysis catheter EXAM: CHEST  1 VIEW COMPARISON:  CT chest dated 03/18/2019 FINDINGS: Mild patchy left lower lobe opacity, new, likely atelectasis. Right lung is clear. No pleural effusion or pneumothorax. The heart is top-normal in size. Right IJ dual lumen dialysis catheter terminates in the lower SVC. Old left posterior rib fracture deformity. IMPRESSION: Right IJ dual lumen dialysis catheter terminates in the lower SVC. Mild patchy left lower lobe opacity, likely atelectasis. Electronically Signed   By: Julian Hy M.D.   On: 03/30/2019 09:51   CT HEAD WO CONTRAST  Result Date: 04/07/2019 CLINICAL DATA:  New onset neuro changes. Clinical suspicion for acute stroke. EXAM: CT HEAD WITHOUT CONTRAST TECHNIQUE: Contiguous axial images were obtained from the base of the skull through the vertex without intravenous contrast. COMPARISON:  03/26/2019. FINDINGS: Brain: The low density the previously demonstrated at the site of recent infarction in the left paramedian midbrain is less prominent. 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 No CT evidence of acute infarction. No intracranial hemorrhage or mass lesion. Vascular: No hyperdense vessel or unexpected calcification. Skull: Normal. Negative for fracture or focal lesion. Sinuses/Orbits: Stable mild dysconjugate gaze. Stable chronic soft tissue and calcific density in the left maxillary sinus, suggesting fungal sinusitis. Other: None. IMPRESSION: 1. No acute abnormality. 2. Expected evolution of the previously demonstrated left paramedian midbrain infarction. 3. 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 4. 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  Electronically Signed   By: Claudie Revering M.D.   On: 04/07/2019 12:25   CT HEAD WO CONTRAST  Result Date: 03/26/2019 CLINICAL DATA:  Follow-up stroke.  Found on the floor. EXAM: CT HEAD WITHOUT CONTRAST TECHNIQUE: Contiguous axial images were obtained from the base of the skull through the vertex without intravenous contrast. COMPARISON:  MRI 03/15/2019.  CT 03/17/2019. FINDINGS: Brain: Low-density remains visible at the site of recent infarction of the left paramedian mid brain. Old infarction of the left pons as seen previously. No evidence of hemorrhage or swelling. Old small vessel cerebellar infarctions. Chronic small-vessel ischemic changes of the cerebral hemispheric white matter. No sign of additional infarction. No hydrocephalus or extra-axial collection. Vascular: There is atherosclerotic calcification of the major vessels at the base of the brain. Skull: Negative Sinuses/Orbits: Chronic opacification of the left maxillary sinus with mucoid material. Fungal sinusitis not excluded. Other: None IMPRESSION: No new finding. Of all vein changes of recent infarction of the left paramedian mid brain. No hemorrhage. Old ischemic changes elsewhere as noted above. Left maxillary sinus disease which could be chronic sinusitis with inspissated mucus or fungal sinusitis. Electronically Signed   By: Nelson Chimes M.D.   On:  03/26/2019 09:17   CT HEAD WO CONTRAST  Result Date: 03/17/2019 CLINICAL DATA:  69 year old male with stroke follow-up. EXAM: CT HEAD WITHOUT CONTRAST TECHNIQUE: Contiguous axial images were obtained from the base of the skull through the vertex without intravenous contrast. COMPARISON:  Head CT dated 03/13/2019 and MRI dated 03/15/2019 FINDINGS: Brain: There is mild age-related atrophy and chronic microvascular ischemic changes. Expected evolution of the left paramedian midbrain infarct (series 2, image 12). No hemorrhagic conversion. 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 There is no  acute intracranial hemorrhage. No mass effect or midline shift. No extra-axial fluid collection. Vascular: No hyperdense vessel or unexpected calcification. Skull: Normal. Negative for fracture or focal lesion. Sinuses/Orbits: Chronic opacification the left maxillary sinus with inspissated mucous. The remainder of the visualized paranasal sinuses and mastoid air cells are clear. No air-fluid level. Other: None IMPRESSION: 1. No acute intracranial hemorrhage. Interval expected evolution of the previously seen mid brain infarct. No hemorrhagic conversion. 2. Age-related atrophy and chronic microvascular ischemic changes. Electronically Signed   By: Anner Crete M.D.   On: 03/17/2019 19:01   CT CHEST WO CONTRAST  Result Date: 03/18/2019 CLINICAL DATA:  Cough. EXAM: CT CHEST WITHOUT CONTRAST TECHNIQUE: Multidetector CT imaging of the chest was performed following the standard protocol without IV contrast. COMPARISON:  November 05, 2016. FINDINGS: Cardiovascular: Atherosclerosis of thoracic aorta is noted without aneurysm formation. Normal cardiac size. No pericardial effusion. Coronary artery calcifications are noted. Mediastinum/Nodes: No enlarged mediastinal or axillary lymph nodes. Status post thyroidectomy. The trachea and esophagus demonstrate no significant findings. Lungs/Pleura: Lungs are clear. No pleural effusion or pneumothorax. Upper Abdomen: Cholelithiasis is noted. Musculoskeletal: No chest wall mass or suspicious bone lesions identified. IMPRESSION: Status post thyroidectomy. Cholelithiasis. Coronary artery calcifications are noted. No acute abnormality seen in the chest. Aortic Atherosclerosis (ICD10-I70.0). Electronically Signed   By: Marijo Conception M.D.   On: 03/18/2019 12:19   MR BRAIN WO CONTRAST  Result Date: 04/08/2019 CLINICAL DATA:  Encephalopathy. EXAM: MRI HEAD WITHOUT CONTRAST TECHNIQUE: Multiplanar, multiecho pulse sequences of the brain and surrounding structures were obtained  without intravenous contrast. COMPARISON:  Head CT 04/07/2019 and MRI 03/15/2019 FINDINGS: Brain: There is only minimal residual diffusion signal abnormality in the left paramedian midbrain corresponding to the acute infarct on last month's MRI. No acute infarct, mass, midline shift, or extra-axial fluid collection is identified. Small chronic infarcts are again noted in the left paracentral pons, cerebellum, left thalamus, and white matter adjacent to the right frontal horn. T2 hyperintensities elsewhere in the cerebral white matter bilaterally are unchanged and nonspecific but compatible with moderate chronic small vessel ischemic disease. There is moderate cerebral atrophy. Several scattered chronic microhemorrhages are again noted in the cerebrum and cerebellum. Vascular: Major intracranial vascular flow voids are preserved. Skull and upper cervical spine: Unremarkable bone marrow signal. Sinuses/Orbits: Unremarkable orbits. Near complete opacification of the left maxillary sinus by complex material. No significant mastoid fluid. Other: None. IMPRESSION: 1. No acute intracranial abnormality. 2. Expected evolution of midbrain infarct since 03/15/2019. 3. Chronic small vessel ischemic disease with multiple old infarcts as above. Electronically Signed   By: Logan Bores M.D.   On: 04/08/2019 15:17   DG CHEST PORT 1 VIEW  Result Date: 04/08/2019 CLINICAL DATA:  Shortness of breath EXAM: PORTABLE CHEST 1 VIEW COMPARISON:  03/29/2018 FINDINGS: Cardiac shadow is mildly enlarged but stable. Dialysis catheter is again seen in satisfactory position. The lungs are clear bilaterally. Old rib fracture with healing on  the left is seen. IMPRESSION: No acute abnormality noted. Electronically Signed   By: [name] M.D.   On: 04/08/2019 12:40   DG Swallowing Func-Speech Pathology  Result Date: 04/02/2019 Objective Swallowing Evaluation: Type of Study: MBS-Modified Barium Swallow Study  Patient Details Name: Alize Acy MRN: [id number] Date of Birth: 05-29-1950 Today's Date: 04/02/2019 Time: SLP Start Time (ACUTE ONLY): 4098 -SLP Stop Time (ACUTE ONLY): 1191 SLP Time Calculation (min) (ACUTE ONLY): 24 min Past Medical History: Past Medical History: Diagnosis Date . Anxiety  . BPH (benign prostatic hyperplasia)  . Chronic kidney disease  . Colon polyps  . COPD (chronic obstructive pulmonary disease) ([place])  . Depression  . Diabetes mellitus without complication ([place])  . Diverticulosis of colon  . Hyperlipidemia  . Hypertension  . Hypothyroidism  . Nephrolithiasis  . Seizures ([place])  Past Surgical History: Past Surgical History: Procedure Laterality Date . AV FISTULA PLACEMENT Left 03/30/2019  Procedure: right brachiocephalic fistula creation;  Surgeon: Marty Heck, MD;  Location: [facility];  Service: Vascular;  Laterality: Left; . COLONOSCOPY   . COLONOSCOPY WITH PROPOFOL N/A 05/30/2015  Procedure: COLONOSCOPY WITH PROPOFOL;  Surgeon: Manya Silvas, MD;  Location: [facility] ENDOSCOPY;  Service: Endoscopy;  Laterality: N/A; . COLONOSCOPY WITH PROPOFOL N/A 12/01/2018  Procedure: COLONOSCOPY WITH PROPOFOL;  Surgeon: Toledo, [name], MD;  Location: [facility] ENDOSCOPY;  Service: Gastroenterology;  Laterality: N/A; . INSERTION OF DIALYSIS CATHETER Right 03/30/2019  Procedure: Ultrasound guided right internal jugular tunneled dialysis catheter placement;  Surgeon: Marty Heck, MD;  Location: [facility] OR;  Service: Vascular;  Laterality: Right; . kidney stone   . THYROIDECTOMY   . VARICOCELE EXCISION   . VIDEO BRONCHOSCOPY Bilateral 05/25/2016  Procedure: VIDEO BRONCHOSCOPY WITHOUT FLUORO;  Surgeon: Juanito Doom, MD;  Location: [facility] ENDOSCOPY;  Service: Cardiopulmonary;  Laterality: Bilateral; HPI: Pt is a 69 y/o male admitted for hypocalcemia. Pt has PMH to include Multiple medical issues including ESRD on peritoneal dialysis daily but has "missed" sessions per pt report, COPD, HTN, and seizures, Tobacco and ETOH use, Lung R Nodule,  collapse of R lung. Pt currently receiving HHPT, however has been declining physically and reports multiple falls. 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 Chronic small-vessel ischemic changes elsewhere throughout the brain.  Prior to admission per chart notes, pt reports that he has been feeling very lightheaded and weak for about the past 24 hours.  He had an episode earlier today where he was eating at a restaurant and suddenly felt very shaky, " like I was going to have a seizure".  He reports his last seizure being over 5 years ago and did not end up having a seizure episode today, states he has been compliant with his Keppra.  He had an appointment with his GI doctor later in the day for chronic diarrhea and had lab work performed at that time.  He then received a call this evening that his Calcium was critically low.  Per NSG report this AM, pt was coughing when trying to swallow Pills.  Subjective: Patient alert, sitting up in bed, dysarthric speech Assessment / Plan / Recommendation CHL IP CLINICAL IMPRESSIONS 04/02/2019 Clinical Impression Pt presents with mild pharyngeal dysphagia c/b decreased anterior hyoid movement. As such, pt has moderate amounts of vallecular residue with dysphagia 3. Liquid wash was helpful in clearing residue as pt had better response to heavier  bolus when dysphagia 3 and liquid were present with liquid wash. Whole pill was administered with just thin liquids and also whole in puree. With each, whole pill was difficult to clear from the vallecula and required multiple boluses of puree to clear. Medicine must be CRUSHED with puree. 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 Recommend pt continue dysphagia 3, thin liquids with medicine crushed in puree. SLP Visit Diagnosis Dysphagia, pharyngeal phase (R13.13)  Attention and concentration deficit following -- Frontal lobe and executive function deficit following -- Impact on safety and function Mild aspiration risk   CHL IP TREATMENT RECOMMENDATION 04/02/2019 Treatment Recommendations Therapy as outlined in treatment plan below   Prognosis 03/17/2019 Prognosis for Safe Diet Advancement Fair Barriers to Reach Goals Cognitive deficits;Language deficits;Severity of deficits;Time post onset Barriers/Prognosis Comment -- CHL IP DIET RECOMMENDATION 04/02/2019 SLP Diet Recommendations Dysphagia 3 (Mech soft) solids;Thin liquid Liquid Administration via Cup;Straw Medication Administration Crushed with puree Compensations Minimize environmental distractions;Slow rate;Small sips/bites Postural Changes Seated upright at 90 degrees   CHL IP OTHER RECOMMENDATIONS 04/02/2019 Recommended Consults -- Oral Care Recommendations Oral care BID Other Recommendations --   CHL IP FOLLOW UP RECOMMENDATIONS 04/02/2019 Follow up Recommendations Skilled Nursing facility   [facility] IP FREQUENCY AND DURATION 03/18/2019 Speech Therapy Frequency (ACUTE ONLY) min 3x week Treatment Duration --      CHL IP ORAL PHASE 04/02/2019 Oral Phase WFL Oral - Pudding Teaspoon -- Oral - Pudding Cup -- Oral - Honey Teaspoon -- Oral - Honey Cup -- Oral - Nectar Teaspoon -- Oral - Nectar Cup -- Oral - Nectar Straw -- Oral - Thin Teaspoon -- Oral - Thin Cup -- Oral - Thin Straw -- Oral - Puree -- Oral - Mech Soft -- Oral - Regular -- Oral - Multi-Consistency -- Oral - Pill -- Oral Phase - Comment --  CHL IP PHARYNGEAL PHASE 04/02/2019 Pharyngeal Phase -- Pharyngeal- Pudding Teaspoon -- Pharyngeal -- Pharyngeal- Pudding Cup -- Pharyngeal -- Pharyngeal- Honey Teaspoon -- Pharyngeal -- Pharyngeal- Honey Cup -- Pharyngeal -- Pharyngeal- Nectar Teaspoon -- Pharyngeal -- Pharyngeal- Nectar Cup -- Pharyngeal -- Pharyngeal- Nectar Straw -- Pharyngeal -- Pharyngeal- Thin Teaspoon -- Pharyngeal -- Pharyngeal- Thin Cup -- Pharyngeal -- Pharyngeal-  Thin Straw -- Pharyngeal -- Pharyngeal- Puree Delayed swallow initiation-vallecula Pharyngeal -- Pharyngeal- Mechanical Soft Compensatory strategies attempted (with notebox);Pharyngeal residue - valleculae Pharyngeal -- Pharyngeal- Regular -- Pharyngeal -- Pharyngeal- Multi-consistency -- Pharyngeal -- Pharyngeal- Pill Pharyngeal residue - valleculae;Other (Comment);Compensatory strategies attempted (with notebox) Pharyngeal -- Pharyngeal Comment --  CHL IP CERVICAL ESOPHAGEAL PHASE 04/02/2019 Cervical Esophageal Phase WFL Pudding Teaspoon -- Pudding Cup -- Honey Teaspoon -- Honey Cup -- Nectar Teaspoon -- Nectar Cup -- Nectar Straw -- Thin Teaspoon -- Thin Cup -- Thin Straw -- Puree -- Mechanical Soft -- Regular -- Multi-consistency -- Pill -- Cervical Esophageal Comment -- Happi Overton 04/02/2019, 2:28 PM              EEG adult  Result Date: 04/08/2019 Lora Havens, MD     04/08/2019  5:22 PM Patient Name: Princeston Blizzard MRN: [id number] Epilepsy Attending: Lora Havens Referring Physician/Provider: Dr Baird Kay Date: 04/08/2019 Duration: 22.44 mins Patient history: 69 yo M with multiple chronic infarcts in left midbrain, left pons, cerebellum, left thalamus and epilepsy who presented with ams. EEG to evaluate for seizure. Level of alertness: awake, asleep AEDs during EEG study: LEV Technical aspects: This EEG  study was done with scalp electrodes positioned according to the 10-20 International system of electrode placement. Electrical activity was acquired at a sampling rate of 500Hz  and reviewed with a high frequency filter of 70Hz  and a low frequency filter of 1Hz . EEG data were recorded continuously and digitally stored. DESCRIPTION: The posterior dominant rhythm consists of 7.5Hz  activity of moderate voltage (25-35 uV) seen predominantly in posterior head regions, symmetric and reactive to eye opening and eye closing. Sleep was characterized by vertex waves.  Hyperventilation and photic stimulation  were not performed. IMPRESSION: This study is within normal limits. No seizures or epileptiform discharges were seen throughout the recording. Priyanka Barbra Sarks   DG Fluoro Guide CV Line-No Report  Result Date: 03/30/2019 Fluoroscopy was utilized by the requesting physician.  No radiographic interpretation.   VAS [place] UPPER EXT VEIN MAPPING (PRE-OP AVF)  Result Date: 03/29/2019 UPPER EXTREMITY VEIN MAPPING  Indications: Pre dialysis access. History: ESRD.  Comparison Study: No prior Performing Technologist: Sharion Dove RVS  Examination Guidelines: A complete evaluation includes B-mode imaging, spectral Doppler, color Doppler, and power Doppler as needed of all accessible portions of each vessel. Bilateral testing is considered an integral part of a complete examination. Limited examinations for reoccurring indications may be performed as noted. +-----------------+-------------+----------+---------+ Right Cephalic   Diameter (cm)Depth (cm)Findings  +-----------------+-------------+----------+---------+ Shoulder             0.31        0.17             +-----------------+-------------+----------+---------+ Prox upper arm       0.26        0.24             +-----------------+-------------+----------+---------+ Mid upper arm        0.26        0.24             +-----------------+-------------+----------+---------+ Dist upper arm       0.24        0.48             +-----------------+-------------+----------+---------+ Antecubital fossa    0.38        0.41             +-----------------+-------------+----------+---------+ Prox forearm         0.46        0.45   branching +-----------------+-------------+----------+---------+ Mid forearm          0.50        0.33   branching +-----------------+-------------+----------+---------+ Wrist                0.23        0.32             +-----------------+-------------+----------+---------+  +-----------------+-------------+----------+----------------------+ Right Basilic    Diameter (cm)Depth (cm)       Findings        +-----------------+-------------+----------+----------------------+ Prox upper arm       0.70        1.15                          +-----------------+-------------+----------+----------------------+ Mid upper arm        0.74        0.90                          +-----------------+-------------+----------+----------------------+ Dist upper arm       0.57  0.77         branching        +-----------------+-------------+----------+----------------------+ Antecubital fossa    0.66        0.75          thrombus        +-----------------+-------------+----------+----------------------+ Prox forearm         0.64        0.58   branching and thrombus +-----------------+-------------+----------+----------------------+ Mid forearm          0.45        0.40         branching        +-----------------+-------------+----------+----------------------+ Wrist                0.22        0.23                          +-----------------+-------------+----------+----------------------+ Thrombus noted in right basilic at [facility] and proximal forearm. +-----------------+-------------+----------+---------+ Left Cephalic    Diameter (cm)Depth (cm)Findings  +-----------------+-------------+----------+---------+ Shoulder             0.41        0.94             +-----------------+-------------+----------+---------+ Prox upper arm       0.43        0.27             +-----------------+-------------+----------+---------+ Mid upper arm        0.37        0.24             +-----------------+-------------+----------+---------+ Dist upper arm       0.50        0.30             +-----------------+-------------+----------+---------+ Antecubital fossa    0.69        0.23             +-----------------+-------------+----------+---------+ Prox forearm          0.57        0.61   branching +-----------------+-------------+----------+---------+ Mid forearm          0.36        0.25             +-----------------+-------------+----------+---------+ Wrist                0.42        0.36             +-----------------+-------------+----------+---------+ *See table(s) above for measurements and observations.  Diagnosing physician: Servando Snare MD Electronically signed by Servando Snare MD on 03/29/2019 at 10:54:34 AM.    Final     Microbiology: Recent Results (from the past 240 hour(s))  Culture, blood (routine x 2)     Status: None   Collection Time: 04/08/19  4:12 PM   Specimen: BLOOD  Result Value Ref Range Status   Specimen Description BLOOD RIGHT ANTECUBITAL  Final   Special Requests   Final    BOTTLES DRAWN AEROBIC AND ANAEROBIC Blood Culture adequate volume   Culture   Final    NO GROWTH 5 DAYS Performed at [facility], [address], [place], [place] [address]    Report Status 04/13/2019 FINAL  Final  Culture, blood (routine x 2)     Status: None   Collection Time: 04/08/19  4:12 PM   Specimen: BLOOD RIGHT HAND  Result Value Ref Range Status  Specimen Description BLOOD RIGHT HAND  Final   Special Requests   Final    BOTTLES DRAWN AEROBIC AND ANAEROBIC Blood Culture adequate volume   Culture   Final    NO GROWTH 5 DAYS Performed at [facility], [address], [place], [place] [address]    Report Status 04/13/2019 FINAL  Final  SARS CORONAVIRUS 2 (TAT 6-24 HRS) Nasopharyngeal Nasopharyngeal Swab     Status: None   Collection Time: 04/15/19  7:47 PM   Specimen: Nasopharyngeal Swab  Result Value Ref Range Status   SARS Coronavirus 2 NEGATIVE NEGATIVE Final    Comment: (NOTE) SARS-CoV-2 target nucleic acids are NOT DETECTED. The SARS-CoV-2 RNA is generally detectable in upper and lower respiratory specimens during the acute phase of infection. Negative results do not preclude SARS-CoV-2 infection, do not  rule out co-infections with other pathogens, and should not be used as the sole basis for treatment or other patient management decisions. Negative results must be combined with clinical observations, patient history, and epidemiological information. The expected result is Negative. Fact Sheet for Patients: SugarRoll[employer]be Fact Sheet for Healthcare Providers: https://www.woods-mathews.com/ This test is not yet approved or cleared by the [place] FDA and  has been authorized for detection and/or diagnosis of SARS-CoV-2 by FDA under an Emergency Use Authorization (EUA). This EUA will remain  in effect (meaning this test can be used) for the duration of the COVID-19 declaration under Section 56 4(b)(1) of the Act, 21 U.S.C. section 360bbb-3(b)(1), unless the authorization is terminated or revoked sooner. Performed at [facility], [place] [address] [place], [place] [address]      Labs: Basic Metabolic Panel: Recent Labs  Lab 04/11/19 0526 04/12/19 0436 04/13/19 0620 04/14/19 0528 04/15/19 0442  NA 139 138 139 139 137  K 3.6 4.0 3.8 3.6 3.4*  CL 105 103 102 105 100  CO2 25 24 25 23 26   GLUCOSE 135* 136* 126* 124* 148*  BUN 16 12 23  28* 13  CREATININE 5.42* 4.02* 5.82* 7.30* 4.50*  CALCIUM 7.3* 6.9* 6.7* 6.5* 7.0*  MG 1.9 1.6* 1.9 1.7 1.4*  PHOS 2.9 1.8* 3.5 3.7 2.9   Liver Function Tests: Recent Labs  Lab 04/11/19 0526 04/12/19 0436 04/13/19 0620 04/14/19 0528 04/15/19 0442  AST 17 18 17 15 17   ALT 13 12 11 11 11   ALKPHOS 59 60 64 64 65  BILITOT 0.6 0.7 0.5 0.7 0.7  PROT 4.7* 4.6* 4.3* 4.3* 4.4*  ALBUMIN 2.1* 2.1* 2.0* 2.0* 1.9*   No results for input(s): LIPASE, AMYLASE in the last 168 hours. No results for input(s): AMMONIA in the last 168 hours. CBC: Recent Labs  Lab 04/11/19 0526 04/12/19 0436 04/13/19 0620 04/14/19 0528 04/15/19 0442  WBC 6.7 7.4 7.6 6.8 5.7  NEUTROABS 4.7 5.4 5.7 5.0 4.1  HGB 8.6*  8.6* 8.1* 7.8* 7.5*  HCT 26.2* 25.7* 24.3* 23.8* 22.4*  MCV 94.6 93.5 93.1 93.7 93.7  PLT 192 190 206 221 213   Cardiac Enzymes: No results for input(s): CKTOTAL, CKMB, CKMBINDEX, TROPONINI in the last 168 hours. BNP: BNP (last 3 results) No results for input(s): BNP in the last 8760 hours.  ProBNP (last 3 results) No results for input(s): PROBNP in the last 8760 hours.  CBG: Recent Labs  Lab 04/15/19 1116 04/15/19 1615 04/15/19 2205 04/16/19 0745 04/16/19 1151  GLUCAP 191* 153* 231* 130* 203*       Signed:  Kayleen Memos, MD [facility] 04/16/2019, 2:49  PM

## 2019-04-16 NOTE — Progress Notes (Signed)
Renal Navigator continues to await confirmation of acceptance to [facility] OP HD clinic for treatment while in rehab at [facility]. Referral submitted yesterday, 04/15/19 and Navigator is hopeful that patient will be approved to start tomorrow, 04/17/19, however, SW clinic is on a weather delay this morning and will not open until 12:00pm. Renal Navigator following closely and will update medical team as information is available. Patient cannot discharge to SNF until OP HD seat is confirmed.  Alphonzo Cruise, [place] Renal Navigator [phone]

## 2019-04-16 NOTE — Discharge Instructions (Signed)
Gastrointestinal Bleeding Gastrointestinal (GI) bleeding is bleeding somewhere along the path that food travels through the body (digestive tract). This path is anywhere between the mouth and the opening of the butt (anus). You may have blood in your poop (stool) or have black poop. If you throw up (vomit), there may be blood in it. This condition can be mild, serious, or even life-threatening. If you have a lot of bleeding, you may need to stay in the hospital. What are the causes? This condition may be caused by:  Irritation and swelling of the esophagus (esophagitis). The esophagus is part of the body that moves food from your mouth to your stomach.  Swollen veins in the butt (hemorrhoids).  Areas of painful tearing in the opening of the butt (anal fissures). These are often caused by passing hard poop.  Pouches that form on the colon over time (diverticulosis).  Irritation and swelling (diverticulitis) in areas where pouches have formed on the colon.  Growths (polyps) or cancer. Colon cancer often starts out as growths that are not cancer.  Irritation of the stomach lining (gastritis).  Sores (ulcers) in the stomach. What increases the risk? You are more likely to develop this condition if you:  Have a certain type of infection in your stomach (Helicobacter pylori infection).  Take certain medicines.  Smoke.  Drink alcohol. What are the signs or symptoms? Common symptoms of this condition include:  Throwing up (vomiting) material that has bright red blood in it. It may look like coffee grounds.  Changes in your poop. The poop may: ? Have red blood in it. ? Be black, look like tar, and smell stronger than normal. ? Be red.  Pain or cramping in the belly (abdomen). How is this treated? Treatment for this condition depends on the cause of the bleeding. For example:  Sometimes, the bleeding can be stopped during a procedure that is done to find the problem (endoscopy or  colonoscopy).  Medicines can be used to: ? Help control irritation, swelling, or infection. ? Reduce acid in your stomach.  Certain problems can be treated with: ? Creams. ? Medicines that are put in the butt (suppositories). ? Warm baths.  Surgery is sometimes needed.  If you lose a lot of blood, you may need a blood transfusion. If bleeding is mild, you may be allowed to go home. If there is a lot of bleeding, you will need to stay in the hospital. Follow these instructions at home:   Take over-the-counter and prescription medicines only as told by your doctor.  Eat foods that have a lot of fiber in them. These foods include beans, whole grains, and fresh fruits and vegetables. You can also try eating 1-3 prunes each day.  Drink enough fluid to keep your pee (urine) pale yellow.  Keep all follow-up visits as told by your doctor. This is important. Contact a doctor if:  Your symptoms do not get better. Get help right away if:  Your bleeding does not stop.  You feel dizzy or you pass out (faint).  You feel weak.  You have very bad cramps in your back or belly.  You pass large clumps of blood (clots) in your poop.  Your symptoms are getting worse.  You have chest pain or fast heartbeats. Summary  GI bleeding is bleeding somewhere along the path that food travels through the body (digestive tract).  This bleeding can be caused by many things. Treatment depends on the cause of the bleeding.  Take medicines only as told by your doctor.  Keep all follow-up visits as told by your doctor. This is important. This information is not intended to replace advice given to you by your health care provider. Make sure you discuss any questions you have with your health care provider. Document Revised: 09/25/2017 Document Reviewed: 09/25/2017 Elsevier Patient Education  2020 [employer].  Hemodialysis Hemodialysis is a way of taking wastes, salt (sodium), and extra water out  of the blood. It also helps keep healthy mineral levels in the blood. It is done when your kidneys no longer work right and cannot keep your blood clean. During this procedure, your blood flows out of your body through a tube to a machine (dialyzer). A filter in the machine cleans the blood, and the blood goes back to your body through a tube. This procedure is normally started when you have lost about 85-90% of your kidney function. It may be started sooner if it may help your symptoms. This is most often done at a hospital or clinic 3 times a week. Visits last 3-5 hours. With training, you may be able to do this at home on a different schedule. Tell your doctor about:  Any allergies you have.  All medicines you are taking, including vitamins, herbs, eye drops, creams, and over-the-counter medicines.  Any blood disorders you have.  Any surgeries you have had.  Any medical conditions you have. What are the risks? Generally, this is a safe procedure. However, problems may occur, including:  Low blood pressure.  A problem at the site where blood is taken away from the body and sent back to the body (vascular access site). This could be from an infection or a blockage.  Allergic reaction to chemicals used to clean the machine.  Narrowing or ballooning of a blood vessel, or bleeding. This may happen at the site where blood is taken from the body and sent back to the body. This is rare. What happens before the procedure?        Medicines Ask your doctor about:  Changing or stopping your normal medicines. This is important.  Taking aspirin and ibuprofen. Do not take these medicines unless your doctor tells you to take them.  Taking over-the-counter medicines, vitamins, herbs, and supplements. General instructions  A surgery or procedure will be done to make an opening for the access site. This will be the site where blood is taken from the body and sent back to the body. This will be  done before you have hemodialysis for the first time. There are three ways to create the access site: ? Arteriovenous fistula. Surgery is done to connect an artery and a vein. This is often done in the arm. The access usually takes 1-6 months to form after surgery. ? Arteriovenous graft. A tube is used to connect an artery and a vein in the arm. The access can usually be used within 2-3 weeks of surgery. ? A venous catheter. A long, thin tube is placed in a large vein in your neck, chest, or groin. The access can be used right away.  Follow instructions from your doctor about what you cannot eat or drink.  Ask your doctor what steps will be taken to prevent the spread of germs. What happens during the procedure?   Your weight, blood pressure, pulse, and temperature will be measured.  Your access will be connected to the machine. ? If you have a fistula or graft, two needles will be  placed through the access. They will be connected to a tube. The tube will be connected to the machine. The needles will be taped to your skin. This keeps them from moving. ? If you have a long, thin tube, it will be connected to a second tube. The second tube will then be connected to the machine.  The machine will be turned on. This will cause your blood to flow to the machine. The machine will clean your blood. Then your blood will go back to your body through a tube. Your blood pressure and pulse will be checked a few times while the machine cleans your blood.  When your blood has been cleaned, you will be disconnected from the machine. If you have a fistula or graft, the needles will be taken out. A bandage (dressing) will then be put on the access. If you have a tube, the tube connected to the machine will be taken out. This procedure is done while you are sitting or reclining. You may sleep, read, or do other tasks that can be done in this position. If you have side effects, tell your doctor. The process can be  changed for your comfort. The procedure may vary among doctors and hospitals. What happens after the procedure?  You will be monitored until you leave the hospital or clinic. This includes checking your blood pressure, heart rate, breathing rate, and blood oxygen level.  You will be weighed.  Your blood will be tested. The tests check that your treatments are taking out enough wastes. This is normally done once a month.  You may have side effects. These may include: ? Feeling dizzy. ? Muscle cramps. ? Feeling like you may vomit. ? Headaches. ? Allergic reaction. Where to find more information [place]: www.kidney.org Summary  Hemodialysis is a way of taking out wastes, salt, and extra water from your blood. It is done when your kidneys no longer work right and cannot keep your blood clean. This procedure is done 3 times a week, and visits last 3-5 hours.  Before the procedure, you will have a surgery to make an opening where blood will be taken out of the body and sent back to the body.  During the procedure, your blood flows out of your body through a tube to a machine. The machine cleans the blood and sends it back to your body. This information is not intended to replace advice given to you by your health care provider. Make sure you discuss any questions you have with your health care provider. Document Revised: 10/14/2018 Document Reviewed: 10/14/2018 Elsevier Patient Education  [place].  Hemodialysis, Care After This sheet gives you information about how to care for yourself after your procedure. Your doctor may also give you more specific instructions. If you have problems or questions, contact your doctor. What can I expect after the procedure? After the procedure, it is common to:  Feel tired (fatigued). It is normal to get energy levels back the day after the procedure.  Feel itchy. Your doctor may be able to prescribe medicine that can  help.  Have achy or jumpy legs. You may feel like you need to kick your legs. This sometimes causes sleep problems. Follow these instructions at home: Eating and drinking   Follow instructions from your doctor about what you cannot eat or drink.  Talk with your doctor about how much fluid you can drink. Track how much fluid you drink so you do not drink  more than you should. This is important. Fluid can build up in your body between dialysis sessions. Built-up fluid: ? Affects blood pressure. ? Can make your heart work harder.  Limit your salt (sodium) intake. This is important because: ? Salt causes thirst. This is a problem. You need to limit how much fluid you can drink every day. ? Salt can change blood pressure. Having less salt in your diet may help you keep a healthy blood pressure.  Limit how much potassium you have. Levels of this mineral can rise between dialysis sessions. High levels can cause a dangerous heart rhythm. This can threaten your life.  Limit how much phosphorus you have. Having too much of this mineral: ? Can make bones lose calcium. This makes bones weaker and more likely to break. ? May make your skin itch.  Eat high-quality proteins that are low in phosphorus. High-quality proteins include those found in fish, poultry, eggs, beans, and lentils. Care of your vascular access site  Avoid sleeping on the site where blood is taken away from your body and sent back to your body (vascular access site).  If your access site is on your arm: ? Do not lift any heavy things with that arm. ? Do not wear tight clothing over that arm. ? Do not have your blood pressure measured on that arm. ? Do not have needles put into that arm. These include needles used to draw blood.  If you have a long, thin tube (catheter), do not open it between dialysis sessions.  Sometimes an access site is made through surgery (fistula). Sometimes the access site is a tube that is placed  (graft). If you have one of these: ? Follow instructions from your doctor about how to take care of your access site. Wash it with soap every day and before each dialysis session. ? Make sure you:  Wash your hands with soap and water for at least 20 seconds before and after you change your bandage (dressing). If you cannot use soap and water, use hand sanitizer.  Change your bandage as told by your doctor. ? Feel for a slight trembling over the access site (thrill) every day. If you feel trembling, the access is working. ? Check your access site every day for signs of infection. Check for:  Redness, swelling, or pain.  Fluid or blood.  Warmth.  Pus or a bad smell. Activity  Rest as told by your doctor.  Return to your normal activities as told by your doctor. Ask your doctor what activities are safe for you. Managing trouble pooping Your condition may cause trouble pooping (constipation). To prevent or treat trouble pooping, you may need to:  Take over-the-counter or prescription medicines.  Eat foods that are high in fiber, such as: ? Beans. ? Whole grains. ? Fresh fruits. ? Fresh vegetables.  Limit foods that are high in fat and sugar. These include fried or sweet foods. Alcohol use  Do not drink alcohol if: ? Your doctor tells you not to drink. ? You are pregnant, may be pregnant, or are planning to become pregnant.  If you drink alcohol: ? Limit how much you use to:  0-1 drink a day for women.  0-2 drinks a day for men. ? Be aware of how much alcohol is in your drink. In the [place] one drink equals one 12 oz bottle of beer (355 mL), one 5 oz glass of wine (148 mL), or one 1 oz glass of hard liquor (  44 mL). General instructions   Take over-the-counter and prescription medicines only as told by your doctor. This includes vitamin and mineral supplements. Talk with your doctor before taking new supplements.  Do not take baths, swim, or use a hot tub until your  doctor approves. Ask your doctor if you may take showers. You may only be allowed to take sponge baths.  Carry an item that shows you get dialysis. The item may be a wallet card, bracelet, or medical identification tag. Always keep the item with you. This helps if an accident or medical emergency happens. If you cannot speak, the item will let doctors know about your condition.  Keep all dialysis visits as told by your doctor. Dialysis sessions should be scheduled regularly. Do not skip a session. Contact a doctor if:  You have a fever.  You have chills.  You have any of these signs of infection at or near your access site: ? Warmth. ? Redness, swelling, or pain. ? Fluid or blood. ? Pus or a bad smell.  You find a pimple on your access site. Get help right away if:  You have symptoms at the access site on your arm, such as: ? The hand on the side of your body where you had surgery gets painful, loses feeling (gets numb), or gets very pale. ? No trembling at the access site.  You get dizzy or weak, and you did not have this before.  You get short of breath or get chest pain.  You have bleeding at your access site that is hard to control.  You get mixed up (confused), or you do not know the time of day, where you are, or who you are.  You get blurred vision.  You have a seizure. These symptoms may be an emergency. Do not wait to see if the symptoms will go away. Get medical help right away. Call your local emergency services (911 in the [place] Do not drive yourself to the hospital. Summary  After the procedure, it is common to feel tired, feel itchy, and have achy or jumpy legs.  Track how much fluid you drink, and limit how much of some minerals you are getting. Eat high-quality proteins, and take supplements as told.  Take good care of your access site. Tell your doctor if you have problems. These may include warmth, redness, swelling, fluid, blood, pus, or a bad  smell. This information is not intended to replace advice given to you by your health care provider. Make sure you discuss any questions you have with your health care provider. Document Revised: 10/15/2018 Document Reviewed: 10/15/2018 Elsevier Patient Education  [place].  Chronic Kidney Disease, Adult Chronic kidney disease (CKD) happens when the kidneys are damaged over a long period of time. The kidneys are two organs that help with:  Getting rid of waste and extra fluid from the blood.  Making hormones that maintain the amount of fluid in your tissues and blood vessels.  Making sure that the body has the right amount of fluids and chemicals. Most of the time, CKD does not go away, but it can usually be controlled. Steps must be taken to slow down the kidney damage or to stop it from getting worse. If this is not done, the kidneys may stop working. Follow these instructions at home: Medicines  Take over-the-counter and prescription medicines only as told by your doctor. You may need to change the amount of medicines you take.  Do not  take any new medicines unless your doctor says it is okay. Many medicines can make your kidney damage worse.  Do not take any vitamin and supplements unless your doctor says it is okay. Many vitamins and supplements can make your kidney damage worse. General instructions  Follow a diet as told by your doctor. You may need to stay away from: ? Alcohol. ? Salty foods. ? Foods that are high in:  Potassium.  Calcium.  Protein.  Do not use any products that contain nicotine or tobacco, such as cigarettes and e-cigarettes. If you need help quitting, ask your doctor.  Keep track of your blood pressure at home. Tell your doctor about any changes.  If you have diabetes, keep track of your blood sugar as told by your doctor.  Try to stay at a healthy weight. If you need help, ask your doctor.  Exercise at least 30 minutes a day, 5 days a  week.  Stay up-to-date with your shots (immunizations) as told by your doctor.  Keep all follow-up visits as told by your doctor. This is important. Contact a doctor if:  Your symptoms get worse.  You have new symptoms. Get help right away if:  You have symptoms of end-stage kidney disease. These may include: ? Headaches. ? Numbness in your hands or feet. ? Easy bruising. ? Having hiccups often. ? Chest pain. ? Shortness of breath. ? Stopping of menstrual periods in women.  You have a fever.  You have very little pee (urine).  You have pain or bleeding when you pee. Summary  Chronic kidney disease (CKD) happens when the kidneys are damaged over a long period of time.  Most of the time, this condition does not go away, but it can usually be controlled. Steps must be taken to slow down the kidney damage or to stop it from getting worse.  Treatment may include a combination of medicines and lifestyle changes. This information is not intended to replace advice given to you by your health care provider. Make sure you discuss any questions you have with your health care provider. Document Revised: 01/25/2017 Document Reviewed: 03/19/2016 Elsevier Patient Education  2020 [employer].

## 2019-04-16 NOTE — Progress Notes (Signed)
Patient has been accepted at [facility] clinic for OP HD treatment on a MWF schedule with a seat time of 12:00pm. He needs to arrive 20 minutes early to his appointments. On his first day of treatment, tomorrow, 04/17/19 he needs to arrive to the clinic at 11:00am to complete consent/intake paperwork prior to his first treatment.  Renal Navigator notified CSW/C. Johnson, Dr. Marval Regal and Renal PA/K. Stovall. PA to send orders to the clinic. Clinic aware and prepared for patient to start tomorrow. Patient is cleared for discharge from an OP HD standpoint.  Alphonzo Cruise, [place] Renal Navigator [phone]

## 2019-04-16 NOTE — Progress Notes (Signed)
Patient ID: Kyle [name], male   DOB: 03-Jan-1951, 69 y.o.   MRN: [id number] S: Feels well and anxious to get out of the hospital O:BP (!) 144/67 (BP Location: Right Arm)   Pulse 67   Temp 98.7 F (37.1 C) (Axillary)   Resp 12   Wt 90.3 kg   SpO2 98%   BMI 27.00 kg/m   Intake/Output Summary (Last 24 hours) at 04/16/2019 0940 Last data filed at 04/16/2019 0800 Gross per 24 hour  Intake 450 ml  Output 1000 ml  Net -550 ml   Intake/Output: I/O last 3 completed shifts: In: 320 [P.O.:320] Out: 1000 [Other:1000]  Intake/Output this shift:  Total I/O In: 250 [P.O.:250] Out: -  Weight change: -1.6 kg Gen: NAD CVS: no rub Resp: cta Abd: benign Ext: no edema, LUE AVF +T/B  Recent Labs  Lab 04/10/19 0229 04/11/19 0526 04/12/19 0436 04/13/19 0620 04/14/19 0528 04/15/19 0442  NA 137 139 138 139 139 137  K 3.4* 3.6 4.0 3.8 3.6 3.4*  CL 102 105 103 102 105 100  CO2 26 25 24 25 23 26   GLUCOSE 179* 135* 136* 126* 124* 148*  BUN 8 16 12 23  28* 13  CREATININE 3.32* 5.42* 4.02* 5.82* 7.30* 4.50*  ALBUMIN 2.1* 2.1* 2.1* 2.0* 2.0* 1.9*  CALCIUM 7.3* 7.3* 6.9* 6.7* 6.5* 7.0*  PHOS 2.1* 2.9 1.8* 3.5 3.7 2.9  AST 19 17 18 17 15 17   ALT 13 13 12 11 11 11    Liver Function Tests: Recent Labs  Lab 04/13/19 0620 04/14/19 0528 04/15/19 0442  AST 17 15 17   ALT 11 11 11   ALKPHOS 64 64 65  BILITOT 0.5 0.7 0.7  PROT 4.3* 4.3* 4.4*  ALBUMIN 2.0* 2.0* 1.9*   No results for input(s): LIPASE, AMYLASE in the last 168 hours. No results for input(s): AMMONIA in the last 168 hours. CBC: Recent Labs  Lab 04/11/19 0526 04/11/19 0526 04/12/19 0436 04/12/19 0436 04/13/19 0620 04/14/19 0528 04/15/19 0442  WBC 6.7   < > 7.4   < > 7.6 6.8 5.7  NEUTROABS 4.7   < > 5.4   < > 5.7 5.0 4.1  HGB 8.6*   < > 8.6*   < > 8.1* 7.8* 7.5*  HCT 26.2*   < > 25.7*   < > 24.3* 23.8* 22.4*  MCV 94.6  --  93.5  --  93.1 93.7 93.7  PLT 192   < > 190   < > 206 221 213   < > = values in this interval  not displayed.   Cardiac Enzymes: No results for input(s): CKTOTAL, CKMB, CKMBINDEX, TROPONINI in the last 168 hours. CBG: Recent Labs  Lab 04/15/19 0738 04/15/19 1116 04/15/19 1615 04/15/19 2205 04/16/19 0745  GLUCAP 114* 191* 153* 231* 130*    Iron Studies: No results for input(s): IRON, TIBC, TRANSFERRIN, FERRITIN in the last 72 hours. Studies/Results: No results found. . sodium chloride   Intravenous Once  . amLODipine  2.5 mg Oral Daily  . atorvastatin  80 mg Oral Daily  . calcium carbonate  400 mg of elemental calcium Oral QHS  . chlorhexidine  15 mL Mouth Rinse BID  . Chlorhexidine Gluconate Cloth  6 each Topical Daily  . clopidogrel  75 mg Oral Daily  . darbepoetin (ARANESP) injection - DIALYSIS  100 mcg Intravenous Q Sat-HD  . feeding supplement (NEPRO CARB STEADY)  237 mL Oral BID BM  . feeding supplement (PRO-STAT SUGAR FREE 64)  30 mL Oral BID  . gentamicin cream  1 application Topical Daily  . heparin injection (subcutaneous)  5,000 Units Subcutaneous Q8H  . insulin aspart  0-15 Units Subcutaneous TID WC  . levETIRAcetam  500 mg Oral BID  . levETIRAcetam  500 mg Oral Q T,Th,Sa-HD  . levothyroxine  200 mcg Oral q morning - 10a  . lipase/protease/amylase  24,000 Units Oral TID  . losartan  50 mg Oral Daily  . metoprolol succinate  50 mg Oral Daily  . multivitamin  1 tablet Oral QHS  . naloxone  0.4 mg Intramuscular Once  . pantoprazole  40 mg Oral BID  . traZODone  50 mg Oral QHS    BMET    Component Value Date/Time   NA 137 04/15/2019 0442   NA 138 08/08/2013 1947   K 3.4 (L) 04/15/2019 0442   K 3.9 08/08/2013 1947   CL 100 04/15/2019 0442   CL 107 08/08/2013 1947   CO2 26 04/15/2019 0442   CO2 21 08/08/2013 1947   GLUCOSE 148 (H) 04/15/2019 0442   GLUCOSE 100 (H) 08/08/2013 1947   BUN 13 04/15/2019 0442   BUN 32 (H) 08/08/2013 1947   CREATININE 4.50 (H) 04/15/2019 0442   CREATININE 2.04 (H) 08/08/2013 1947   CALCIUM 7.0 (L) 04/15/2019 0442    CALCIUM 9.1 08/08/2013 1947   GFRNONAA 12 (L) 04/15/2019 0442   GFRNONAA 33 (L) 08/08/2013 1947   GFRAA 14 (L) 04/15/2019 0442   GFRAA 38 (L) 08/08/2013 1947   CBC    Component Value Date/Time   WBC 5.7 04/15/2019 0442   RBC 2.39 (L) 04/15/2019 0442   HGB 7.5 (L) 04/15/2019 0442   HGB 14.2 08/08/2013 1947   HCT 22.4 (L) 04/15/2019 0442   HCT 42.1 08/08/2013 1947   PLT 213 04/15/2019 0442   PLT 144 (L) 08/08/2013 1947   MCV 93.7 04/15/2019 0442   MCV 92 08/08/2013 1947   MCH 31.4 04/15/2019 0442   MCHC 33.5 04/15/2019 0442   RDW 15.3 04/15/2019 0442   RDW 12.9 08/08/2013 1947   LYMPHSABS 1.0 04/15/2019 0442   MONOABS 0.4 04/15/2019 0442   EOSABS 0.1 04/15/2019 0442   BASOSABS 0.1 04/15/2019 0442    Assessment/Plan:  1. ESRD- transitioned from PD to HD following his stroke. He has been accepted to outpateint HD at [facility] on MWF schedule to accommodate SNF transportation so he had a short HD session yesterday and then follow up as an outpatient on Friday, 04/17/19. 1. Will need to have PD catheter removed after discharge to SNF by Dr. Vevelyn Royals. 2. 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 3. Acute pontine stroke- continued rehab 4. HTN- stable 5. DM- per primary 6. Intermittent agitation- improving 7. Vascular access- s/p LUE AVF placed 03/30/19 8. Disposition- for possible discharge to SNF tomorrow and will need to switch to MWF schedule. Plan for discharge to SNF today and first outpatient HD on Friday 04/17/19. Donetta Potts, MD [name] [phone]

## 2019-04-16 NOTE — Progress Notes (Signed)
Nutrition Follow-up   RD working remotely.  DOCUMENTATION CODES:   Not applicable  INTERVENTION:  Continue Nepro Shake po BID, each supplement provides 425 kcal and 19 grams protein.  Encourage adequate PO intake.   NUTRITION DIAGNOSIS:   Inadequate oral intake related to inability to eat as evidenced by NPO status; diet advanced; improved  GOAL:   Patient will meet greater than or equal to 90% of their needs; met  MONITOR:   PO intake, Supplement acceptance, Skin, Weight trends, Labs, I & O's  REASON FOR ASSESSMENT:   NPO/Clear Liquid Diet    ASSESSMENT:   69 year old Caucasian Male who was Patient was discharged from [facility] on 1/21 to CIR at [facility] after being treated for acute left midbrain stroke. He subsequently developed GI bleed  (acute onset of black and red stools X 5 overnight and also vomited coffee grounds one time while at rehab) and acute blood loss anemia and was transferred from Rehab and admitted to [facility] on 2/6. He has ESRD and is being dialyzed during his hospitalization Tuesdays Thursdays and Saturdays. Nephrology and GI following with plans for colonoscopy. Underwent colonoscopy which showed single nonbleeding colonic angiodysplastic lesion that was treated with monopolar probe. Diverticulosis also seen. Previously on peritoneal dialysis, changed to hemodialysis this admission.  Meal completion has been 90-100%. Pt has been tolerating his po. Pt currently has Nepro shake and Prostat ordered and has been consuming most of them. RD to modify nutritional supplement orders to better meet nutrition needs. Awaiting outpatient HD confirmation prior to discharge.   Labs and medications reviewed.   Diet Order:   Diet Order            Diet Heart Room service appropriate? Yes; Fluid consistency: Thin; Fluid restriction: 1200 mL Fluid  Diet effective now              EDUCATION NEEDS:   Not appropriate for education at this time  Skin:  Skin  Assessment: Reviewed RN Assessment  Last BM:  2/17  Height:   Ht Readings from Last 1 Encounters:  03/20/19 6' (1.829 m)    Weight:   Wt Readings from Last 1 Encounters:  04/15/19 90.3 kg    BMI:  Body mass index is 27 kg/m.  Estimated Nutritional Needs:   Kcal:  2050-2300  Protein:  105-115 grams  Fluid:  1 L + UOP   Corrin Parker, MS, RD, LDN RD pager number/after hours weekend pager number on Amion.

## 2019-04-16 NOTE — Plan of Care (Signed)

## 2019-04-29 ENCOUNTER — Inpatient Hospital Stay (HOSPITAL_COMMUNITY)
Admission: EM | Admit: 2019-04-29 | Discharge: 2019-05-07 | DRG: 377 | Disposition: A | Discharge status: Skilled Nursing Facility | Payer: Medicare PPO | Source: Skilled Nursing Facility | Attending: Internal Medicine | Admitting: Internal Medicine

## 2019-04-29 ENCOUNTER — Other Ambulatory Visit: Payer: Self-pay

## 2019-04-29 DIAGNOSIS — Z8673 Personal history of transient ischemic attack (TIA), and cerebral infarction without residual deficits: Secondary | ICD-10-CM

## 2019-04-29 DIAGNOSIS — R05 Cough: Secondary | ICD-10-CM

## 2019-04-29 DIAGNOSIS — Z79890 Hormone replacement therapy (postmenopausal): Secondary | ICD-10-CM

## 2019-04-29 DIAGNOSIS — K921 Melena: Secondary | ICD-10-CM | POA: Diagnosis present

## 2019-04-29 DIAGNOSIS — E039 Hypothyroidism, unspecified: Secondary | ICD-10-CM | POA: Diagnosis present

## 2019-04-29 DIAGNOSIS — I69351 Hemiplegia and hemiparesis following cerebral infarction affecting right dominant side: Secondary | ICD-10-CM

## 2019-04-29 DIAGNOSIS — G40909 Epilepsy, unspecified, not intractable, without status epilepticus: Secondary | ICD-10-CM

## 2019-04-29 DIAGNOSIS — Z515 Encounter for palliative care: Secondary | ICD-10-CM

## 2019-04-29 DIAGNOSIS — F17210 Nicotine dependence, cigarettes, uncomplicated: Secondary | ICD-10-CM | POA: Diagnosis present

## 2019-04-29 DIAGNOSIS — E890 Postprocedural hypothyroidism: Secondary | ICD-10-CM | POA: Diagnosis present

## 2019-04-29 DIAGNOSIS — F419 Anxiety disorder, unspecified: Secondary | ICD-10-CM | POA: Diagnosis present

## 2019-04-29 DIAGNOSIS — Z20822 Contact with and (suspected) exposure to covid-19: Secondary | ICD-10-CM | POA: Diagnosis present

## 2019-04-29 DIAGNOSIS — R630 Anorexia: Secondary | ICD-10-CM | POA: Diagnosis present

## 2019-04-29 DIAGNOSIS — K5730 Diverticulosis of large intestine without perforation or abscess without bleeding: Secondary | ICD-10-CM | POA: Diagnosis present

## 2019-04-29 DIAGNOSIS — Z794 Long term (current) use of insulin: Secondary | ICD-10-CM

## 2019-04-29 DIAGNOSIS — I152 Hypertension secondary to endocrine disorders: Secondary | ICD-10-CM | POA: Diagnosis present

## 2019-04-29 DIAGNOSIS — I951 Orthostatic hypotension: Secondary | ICD-10-CM | POA: Diagnosis present

## 2019-04-29 DIAGNOSIS — N400 Enlarged prostate without lower urinary tract symptoms: Secondary | ICD-10-CM | POA: Diagnosis present

## 2019-04-29 DIAGNOSIS — I69398 Other sequelae of cerebral infarction: Secondary | ICD-10-CM

## 2019-04-29 DIAGNOSIS — Z6825 Body mass index (BMI) 25.0-25.9, adult: Secondary | ICD-10-CM

## 2019-04-29 DIAGNOSIS — Z7189 Other specified counseling: Secondary | ICD-10-CM

## 2019-04-29 DIAGNOSIS — R059 Cough, unspecified: Secondary | ICD-10-CM

## 2019-04-29 DIAGNOSIS — D179 Benign lipomatous neoplasm, unspecified: Secondary | ICD-10-CM | POA: Diagnosis present

## 2019-04-29 DIAGNOSIS — K31811 Angiodysplasia of stomach and duodenum with bleeding: Principal | ICD-10-CM | POA: Diagnosis present

## 2019-04-29 DIAGNOSIS — Z79899 Other long term (current) drug therapy: Secondary | ICD-10-CM

## 2019-04-29 DIAGNOSIS — Z992 Dependence on renal dialysis: Secondary | ICD-10-CM

## 2019-04-29 DIAGNOSIS — E119 Type 2 diabetes mellitus without complications: Secondary | ICD-10-CM

## 2019-04-29 DIAGNOSIS — N186 End stage renal disease: Secondary | ICD-10-CM | POA: Diagnosis present

## 2019-04-29 DIAGNOSIS — H4902 Third [oculomotor] nerve palsy, left eye: Secondary | ICD-10-CM | POA: Diagnosis present

## 2019-04-29 DIAGNOSIS — Z9114 Patient's other noncompliance with medication regimen: Secondary | ICD-10-CM

## 2019-04-29 DIAGNOSIS — E8889 Other specified metabolic disorders: Secondary | ICD-10-CM | POA: Diagnosis present

## 2019-04-29 DIAGNOSIS — D62 Acute posthemorrhagic anemia: Secondary | ICD-10-CM | POA: Diagnosis not present

## 2019-04-29 DIAGNOSIS — E1169 Type 2 diabetes mellitus with other specified complication: Secondary | ICD-10-CM | POA: Diagnosis present

## 2019-04-29 DIAGNOSIS — E1122 Type 2 diabetes mellitus with diabetic chronic kidney disease: Secondary | ICD-10-CM | POA: Diagnosis present

## 2019-04-29 DIAGNOSIS — K922 Gastrointestinal hemorrhage, unspecified: Secondary | ICD-10-CM

## 2019-04-29 DIAGNOSIS — E785 Hyperlipidemia, unspecified: Secondary | ICD-10-CM | POA: Diagnosis present

## 2019-04-29 DIAGNOSIS — F329 Major depressive disorder, single episode, unspecified: Secondary | ICD-10-CM | POA: Diagnosis present

## 2019-04-29 DIAGNOSIS — I120 Hypertensive chronic kidney disease with stage 5 chronic kidney disease or end stage renal disease: Secondary | ICD-10-CM | POA: Diagnosis present

## 2019-04-29 DIAGNOSIS — N2581 Secondary hyperparathyroidism of renal origin: Secondary | ICD-10-CM | POA: Diagnosis present

## 2019-04-29 DIAGNOSIS — Z8719 Personal history of other diseases of the digestive system: Secondary | ICD-10-CM

## 2019-04-29 DIAGNOSIS — J449 Chronic obstructive pulmonary disease, unspecified: Secondary | ICD-10-CM | POA: Diagnosis present

## 2019-04-29 DIAGNOSIS — E1159 Type 2 diabetes mellitus with other circulatory complications: Secondary | ICD-10-CM | POA: Diagnosis present

## 2019-04-29 DIAGNOSIS — Z833 Family history of diabetes mellitus: Secondary | ICD-10-CM

## 2019-04-29 DIAGNOSIS — Z7902 Long term (current) use of antithrombotics/antiplatelets: Secondary | ICD-10-CM

## 2019-04-29 DIAGNOSIS — Z87442 Personal history of urinary calculi: Secondary | ICD-10-CM

## 2019-04-29 DIAGNOSIS — T471X6A Underdosing of other antacids and anti-gastric-secretion drugs, initial encounter: Secondary | ICD-10-CM | POA: Diagnosis present

## 2019-04-29 LAB — CBC WITH DIFFERENTIAL/PLATELET
Abs Immature Granulocytes: 0.1 10*3/uL — ABNORMAL HIGH (ref 0.00–0.07)
Basophils Absolute: 0.1 10*3/uL (ref 0.0–0.1)
Basophils Relative: 1 %
Eosinophils Absolute: 0 10*3/uL (ref 0.0–0.5)
Eosinophils Relative: 0 %
HCT: 20.3 % — ABNORMAL LOW (ref 39.0–52.0)
Hemoglobin: 6.6 g/dL — CL (ref 13.0–17.0)
Immature Granulocytes: 1 %
Lymphocytes Relative: 7 %
Lymphs Abs: 0.8 10*3/uL (ref 0.7–4.0)
MCH: 32.7 pg (ref 26.0–34.0)
MCHC: 32.5 g/dL (ref 30.0–36.0)
MCV: 100.5 fL — ABNORMAL HIGH (ref 80.0–100.0)
Monocytes Absolute: 0.5 10*3/uL (ref 0.1–1.0)
Monocytes Relative: 5 %
Neutro Abs: 9.8 10*3/uL — ABNORMAL HIGH (ref 1.7–7.7)
Neutrophils Relative %: 86 %
Platelets: 266 10*3/uL (ref 150–400)
RBC: 2.02 MIL/uL — ABNORMAL LOW (ref 4.22–5.81)
RDW: 16.5 % — ABNORMAL HIGH (ref 11.5–15.5)
WBC: 11.3 10*3/uL — ABNORMAL HIGH (ref 4.0–10.5)
nRBC: 0 % (ref 0.0–0.2)

## 2019-04-29 LAB — COMPREHENSIVE METABOLIC PANEL
ALT: 11 U/L (ref 0–44)
AST: 21 U/L (ref 15–41)
Albumin: 2.5 g/dL — ABNORMAL LOW (ref 3.5–5.0)
Alkaline Phosphatase: 85 U/L (ref 38–126)
Anion gap: 16 — ABNORMAL HIGH (ref 5–15)
BUN: 8 mg/dL (ref 8–23)
CO2: 25 mmol/L (ref 22–32)
Calcium: 7.4 mg/dL — ABNORMAL LOW (ref 8.9–10.3)
Chloride: 92 mmol/L — ABNORMAL LOW (ref 98–111)
Creatinine, Ser: 3.93 mg/dL — ABNORMAL HIGH (ref 0.61–1.24)
GFR calc Af Amer: 17 mL/min — ABNORMAL LOW (ref >60)
GFR calc non Af Amer: 15 mL/min — ABNORMAL LOW (ref >60)
Glucose, Bld: 173 mg/dL — ABNORMAL HIGH (ref 70–99)
Potassium: 3.4 mmol/L — ABNORMAL LOW (ref 3.5–5.1)
Sodium: 133 mmol/L — ABNORMAL LOW (ref 135–145)
Total Bilirubin: 1.3 mg/dL — ABNORMAL HIGH (ref 0.3–1.2)
Total Protein: 5.5 g/dL — ABNORMAL LOW (ref 6.5–8.1)

## 2019-04-29 LAB — PROTIME-INR
INR: 0.9 (ref 0.8–1.2)
Prothrombin Time: 12.5 seconds (ref 11.4–15.2)

## 2019-04-29 LAB — PREPARE RBC (CROSSMATCH)

## 2019-04-29 LAB — POC OCCULT BLOOD, ED: Fecal Occult Bld: POSITIVE — AB

## 2019-04-29 MED ORDER — ACETAMINOPHEN 325 MG PO TABS
650.0000 mg | ORAL_TABLET | Freq: Four times a day (QID) | ORAL | Status: DC | PRN
  Administered 2019-05-02 (×2): 650 mg via ORAL
  Filled 2019-04-29 (×3): qty 2

## 2019-04-29 MED ORDER — ACETAMINOPHEN 650 MG RE SUPP: 650.0000 mg | Freq: Four times a day (QID) | RECTAL | Status: DC | PRN

## 2019-04-29 MED ORDER — SODIUM CHLORIDE 0.9 % IV SOLN
10.0000 mL/h | Freq: Once | INTRAVENOUS | Status: AC
  Administered 2019-04-30: 10 mL/h via INTRAVENOUS

## 2019-04-29 MED ORDER — SODIUM CHLORIDE 0.9 % IV SOLN
80.0000 mg | Freq: Once | INTRAVENOUS | Status: AC
  Administered 2019-04-29: 80 mg via INTRAVENOUS
  Filled 2019-04-29: qty 80

## 2019-04-29 MED ORDER — SODIUM CHLORIDE 0.9 % IV SOLN
8.0000 mg/h | INTRAVENOUS | Status: AC
  Administered 2019-04-30 – 2019-05-02 (×6): 8 mg/h via INTRAVENOUS
  Filled 2019-04-29 (×9): qty 80

## 2019-04-29 NOTE — ED Provider Notes (Signed)
[place] EMERGENCY DEPARTMENT Provider Note   CSN: [id number] Arrival date & time: 04/29/19  1854     History No chief complaint on file.   Kyle [name] is a 69 y.o. male hx of CKD, COPD, DM, seizures, diverticulosis, stroke on Plavix here presenting with GI bleed .  Patient has been having black stools for the last several weeks.  Patient has several admissions for GI bleed.  He was at dialysis today and had labs drawn with hemoglobin 6.1.  Patient sent here for transfusion.  Patient states that he was scoped by Dr. Benson Norway during last admission but did not follow-up with them outpatient.  He is currently in a nursing home.  The history is provided by the patient.       Past Medical History:  Diagnosis Date  . Anxiety   . BPH (benign prostatic hyperplasia)   . Chronic kidney disease   . Colon polyps   . COPD (chronic obstructive pulmonary disease) ([place])   . Depression   . Diabetes mellitus without complication ([place])   . Diverticulosis of colon   . Hyperlipidemia   . Hypertension   . Hypothyroidism   . Nephrolithiasis   . Seizures [facility])     Patient Active Problem List   Diagnosis Date Noted  . Acute on chronic blood loss anemia 04/29/2019  . History of CVA (cerebrovascular accident) 04/29/2019  . GI bleed 04/04/2019  . 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[place]) 03/20/2019  . Generalized weakness   . Diabetic polyneuropathy associated with type 2 diabetes mellitus ([place])   . Cerebrovascular accident (CVA) ([place])   . PVD (peripheral vascular disease) ([place]) 03/17/2019  . Ptosis of eyelid, left 03/15/2019  . Physical deconditioning 03/14/2019  . Hypomagnesemia 03/13/2019  . Hyperphosphatemia 03/13/2019  . ESRD (end stage renal disease) ([place]) 03/13/2019  . Chronic kidney disease with peritoneal dialysis as preferred modality, stage 5 ([place]) 03/13/2019  . Prolonged QT interval 03/13/2019  . Hypertension associated with diabetes ([place]) 03/13/2019  . Hypothyroidism  03/13/2019  . Chronic diarrhea 03/13/2019  . Hypocalcemia 03/12/2019  . Hypokalemia 09/16/2018  . Lung nodule 06/12/2016  . Narrowing of airway   . Cigarette smoker 05/24/2016  . Collapse of right lung 05/24/2016  . COPD with chronic bronchitis ([place]) 05/24/2016  . Acne 05/20/2015  . Calculus of kidney 05/20/2015  . Chest pain, non-cardiac 05/20/2015  . Seizure disorder ([place]) 05/20/2015  . Current tobacco use 05/20/2015  . Chronic kidney disease (CKD), stage III (moderate) 03/29/2015  . Insulin dependent type 2 diabetes mellitus ([place]) 03/15/2015  . Lipoma of shoulder 03/08/2014  . Other synovitis and tenosynovitis, right shoulder 03/08/2014  . Bursitis of elbow 02/15/2014  . Absolute anemia 08/08/2013  . 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 Back pain, chronic 08/08/2013  . Chronic obstructive pulmonary disease ([place]) 08/08/2013  . BP (high blood pressure) 08/08/2013  . Hyperlipidemia associated with type 2 diabetes mellitus ([place]) 08/08/2013  . Acne erythematosa 08/08/2013    Past Surgical History:  Procedure Laterality Date  . AV FISTULA PLACEMENT Left 03/30/2019   Procedure: right brachiocephalic fistula creation;  Surgeon: Marty Heck, MD;  Location: [facility];  Service: Vascular;  Laterality: Left;  . BIOPSY  04/05/2019   Procedure: BIOPSY;  Surgeon: Wilford Corner, MD;  Location: [place];  Service: Endoscopy;;  . COLONOSCOPY    . COLONOSCOPY WITH PROPOFOL N/A 05/30/2015   Procedure: COLONOSCOPY WITH PROPOFOL;  Surgeon: Manya Silvas, MD;  Location: [facility] ENDOSCOPY;  Service: Endoscopy;  Laterality: N/A;  . COLONOSCOPY WITH PROPOFOL N/A 12/01/2018   Procedure: COLONOSCOPY WITH PROPOFOL;  Surgeon: Toledo, [name], MD;  Location: [facility] ENDOSCOPY;  Service: Gastroenterology;  Laterality: N/A;  . COLONOSCOPY WITH PROPOFOL N/A 04/09/2019   Procedure: COLONOSCOPY WITH PROPOFOL;  Surgeon: Carol Ada, MD;  Location: [place];  Service: Endoscopy;  Laterality: N/A;    . ESOPHAGOGASTRODUODENOSCOPY (EGD) WITH PROPOFOL N/A 04/05/2019   Procedure: ESOPHAGOGASTRODUODENOSCOPY (EGD) WITH PROPOFOL;  Surgeon: Wilford Corner, MD;  Location: [place];  Service: Endoscopy;  Laterality: N/A;  . HOT HEMOSTASIS N/A 04/09/2019   Procedure: HOT HEMOSTASIS (ARGON PLASMA COAGULATION/BICAP);  Surgeon: Carol Ada, MD;  Location: [place];  Service: Endoscopy;  Laterality: N/A;  . INSERTION OF DIALYSIS CATHETER Right 03/30/2019   Procedure: Ultrasound guided right internal jugular tunneled dialysis catheter placement;  Surgeon: Marty Heck, MD;  Location: [facility];  Service: Vascular;  Laterality: Right;  . kidney stone    . THYROIDECTOMY    . VARICOCELE EXCISION    . VIDEO BRONCHOSCOPY Bilateral 05/25/2016   Procedure: VIDEO BRONCHOSCOPY WITHOUT FLUORO;  Surgeon: Juanito Doom, MD;  Location[facility] ENDOSCOPY;  Service: Cardiopulmonary;  Laterality: Bilateral;       Family History  Problem Relation Age of Onset  . Diabetes Mother   . Diabetes Maternal Grandmother   . Diabetes Maternal Grandfather   . Lung cancer Father   . Emphysema Paternal Grandfather     Social History   Tobacco Use  . Smoking status: Light Tobacco Smoker    Packs/day: 1.00    Years: 40.00    Pack years: 40.00    Types: Cigarettes    Last attempt to quit: 05/25/2016    Years since quitting: 2.9  . Smokeless tobacco: Never Used  Substance Use Topics  . Alcohol use: Yes    Alcohol/week: 8.0 standard drinks    Types: 8 Cans of beer per week    Comment: beer or wine   . Drug use: No    Home Medications Prior to Admission medications   Medication Sig Start Date End Date Taking? Authorizing Provider  acetaminophen (TYLENOL) 500 MG tablet Take 1,000 mg by mouth daily. 04/21/19 05/05/19 Yes [provider]  amLODipine (NORVASC) 2.5 MG tablet Take 1 tablet (2.5 mg total) by mouth daily. 03/20/19  Yes Wieting, Richard, MD  atorvastatin (LIPITOR) 80 MG tablet Take 1 tablet  (80 mg total) by mouth daily. Patient taking differently: Take 80 mg by mouth at bedtime.  03/20/19 04/29/19 Yes Wieting, Richard, MD  B Complex-C-Folic Acid (NEPHRO-VITE PO) Take 1 tablet by mouth daily.   Yes [provider]  calcium acetate (PHOSLO) 667 MG capsule Take 2 capsules (1,334 mg total) by mouth 3 (three) times daily with meals. 03/20/19  Yes Wieting, Richard, MD  clopidogrel (PLAVIX) 75 MG tablet Take 1 tablet (75 mg total) by mouth daily. 04/17/19 05/17/19 Yes Kayleen Memos, DO  ferrous sulfate (FERROUSUL) 325 (65 FE) MG tablet Take 325 mg by mouth daily with breakfast. 04/21/19 05/19/19 Yes [provider]  gentamicin cream (GARAMYCIN) 0.1 % Apply 1 application topically daily. Patient taking differently: Apply 1 application topically See admin instructions. Apply to affected area once daily for infection 03/20/19  Yes Wieting, Richard, MD  insulin lispro (HUMALOG) 100 UNIT/ML KwikPen 2 units subcutaneous injection for sugars 200-250, 3 units for sugars 251-300; 4 units for sugars greater than 301 Patient taking differently: Inject 0-12 Units into the skin See admin instructions.  Inject 0-12 units into the skin three times a day before meals, depending on BGL: <70 = CALL MD; 70-200 = give nothing; 201-250 = 2 units; 251-300 = 4 units; 301-350 = 6 units; 351-400 = 8 units; 401-450 = 10 units; 451-600 = 12 units 03/20/19  Yes Wieting, Richard, MD  levETIRAcetam (KEPPRA) 500 MG tablet Take 500 mg by mouth 2 (two) times a day. 09/05/15  Yes [provider]  levothyroxine (SYNTHROID) 200 MCG tablet Take 200 mcg by mouth every morning. 02/25/19  Yes [provider]  losartan (COZAAR) 50 MG tablet Take 1 tablet (50 mg total) by mouth daily. 04/17/19 05/17/19 Yes Hall, Lorenda Cahill, DO  Menthol, Topical Analgesic, (BIOFREEZE) 4 % GEL Apply 1 application topically See admin instructions. Apply to back, chest, and abdominal wall twice daily for 10 days 04/21/19 05/01/19 Yes  [provider]  metoprolol succinate (TOPROL-XL) 50 MG 24 hr tablet Take 1 tablet (50 mg total) by mouth daily. Take with or immediately following a meal. 04/17/19 05/17/19 Yes Hall, Carole N, DO  nicotine (NICODERM CQ - DOSED IN MG/24 HOURS) 14 mg/24hr patch Place 14 mg onto the skin daily.   Yes [provider]  Pancrelipase, Lip-Prot-Amyl, 24000-76000 units CPEP Take 1 capsule by mouth 3 (three) times daily.   Yes [provider]  pantoprazole (PROTONIX) 40 MG tablet Take 1 tablet (40 mg total) by mouth 2 (two) times daily. 04/16/19 05/16/19 Yes Hall, Lorenda Cahill, DO  polyethylene glycol powder (GLYCOLAX/MIRALAX) 17 GM/SCOOP powder Take 17 g by mouth daily.   Yes [provider]  multivitamin (RENA-VIT) TABS tablet Take 1 tablet by mouth at bedtime. Patient not taking: Reported on 04/29/2019 03/20/19   Loletha Grayer, MD  Nutritional Supplements (FEEDING SUPPLEMENT, NEPRO CARB STEADY,) LIQD Take 237 mLs by mouth 2 (two) times daily between meals. Patient not taking: Reported on 04/29/2019 03/20/19   Loletha Grayer, MD    Allergies    Patient has no known allergies.  Review of Systems   Review of Systems  Gastrointestinal:       Melena   Neurological: Positive for weakness.  All other systems reviewed and are negative.   Physical Exam Updated Vital Signs BP (!) 143/48   Pulse 87   Temp 98.4 F (36.9 C)   Resp 18   Ht 6' (1.829 m)   Wt 93.9 kg   SpO2 99%   BMI 28.07 kg/m   Physical Exam Vitals and nursing note reviewed.  Constitutional:      Comments: Chronically ill   HENT:     Head: Normocephalic.     Nose: Nose normal.     Mouth/Throat:     Mouth: Mucous membranes are dry.  Eyes:     Extraocular Movements: Extraocular movements intact.     Pupils: Pupils are equal, round, and reactive to light.  Cardiovascular:     Rate and Rhythm: Normal rate and regular rhythm.     Pulses: Normal pulses.     Heart sounds: Normal heart sounds.    Pulmonary:     Effort: Pulmonary effort is normal.     Breath sounds: Normal breath sounds.  Abdominal:     General: Abdomen is flat.     Palpations: Abdomen is soft.  Genitourinary:    Comments: Rectal- melanotic stool, guiac positive  Musculoskeletal:        General: Normal range of motion.     Cervical back: Normal range of motion.  Skin:  General: Skin is warm.     Capillary Refill: Capillary refill takes less than 2 seconds.  Neurological:     General: No focal deficit present.  Psychiatric:        Mood and Affect: Mood normal.        Behavior: Behavior normal.     ED Results / Procedures / Treatments   Labs (all labs ordered are listed, but only abnormal results are displayed) Labs Reviewed  CBC WITH DIFFERENTIAL/PLATELET - Abnormal; Notable for the following components:      Result Value   WBC 11.3 (*)    RBC 2.02 (*)    Hemoglobin 6.6 (*)    HCT 20.3 (*)    MCV 100.5 (*)    RDW 16.5 (*)    Neutro Abs 9.8 (*)    Abs Immature Granulocytes 0.10 (*)    All other components within normal limits  COMPREHENSIVE METABOLIC PANEL - Abnormal; Notable for the following components:   Sodium 133 (*)    Potassium 3.4 (*)    Chloride 92 (*)    Glucose, Bld 173 (*)    Creatinine, Ser 3.93 (*)    Calcium 7.4 (*)    Total Protein 5.5 (*)    Albumin 2.5 (*)    Total Bilirubin 1.3 (*)    GFR calc non Af Amer 15 (*)    GFR calc Af Amer 17 (*)    Anion gap 16 (*)    All other components within normal limits  POC OCCULT BLOOD, ED - Abnormal; Notable for the following components:   Fecal Occult Bld POSITIVE (*)    All other components within normal limits  PROTIME-INR  CBC  BASIC METABOLIC PANEL  TYPE AND SCREEN  PREPARE RBC (CROSSMATCH)    EKG None  Radiology No results found.  Procedures Procedures (including critical care time)  CRITICAL CARE Performed by: Wandra Arthurs   Total critical care time: 40 minutes  Critical care time was exclusive of  separately billable procedures and treating other patients.  Critical care was necessary to treat or prevent imminent or life-threatening deterioration.  Critical care was time spent personally by me on the following activities: development of treatment plan with patient and/or surrogate as well as nursing, discussions with consultants, evaluation of patient's response to treatment, examination of patient, obtaining history from patient or surrogate, ordering and performing treatments and interventions, ordering and review of laboratory studies, ordering and review of radiographic studies, pulse oximetry and re-evaluation of patient's condition.   Medications Ordered in ED Medications  0.9 %  sodium chloride infusion (has no administration in time range)  pantoprazole (PROTONIX) 80 mg in sodium chloride 0.9 % 100 mL (0.8 mg/mL) infusion (has no administration in time range)  acetaminophen (TYLENOL) tablet 650 mg (has no administration in time range)    Or  acetaminophen (TYLENOL) suppository 650 mg (has no administration in time range)  pantoprazole (PROTONIX) 80 mg in sodium chloride 0.9 % 100 mL IVPB (0 mg Intravenous Stopped 04/29/19 2223)    ED Course  I have reviewed the triage vital signs and the nursing notes.  Pertinent labs & imaging results that were available during my care of the patient were reviewed by me and considered in my medical decision making (see chart for details).    MDM Rules/Calculators/A&P                      Maveric Debono is a 69 y.o. male  here with melena, symptomatic anemia. Patient is on plavix. Has recent endoscopy that was unremarkable and colonoscopy that showed diverticulosis. His Hg was 6.1 prior to dialysis. I am concerned for slow upper GI bleed given that he has melena. Will repeat CBC and chemistry and likely need transfusion.   8:00 pm Hg is 6.6. Ordered 2 U PRBC and protonix. Talked to Dr. Collene Mares, who is covering Dr. Benson Norway. Since he didn't see  Dr. Benson Norway outpatient so he will need to go to unassigned GI.   8:30 PM Talked to Dr. Watt Climes who will see patient. Nephrology will also put him on the list for dialysis tomorrow.   8:45 pm Hospitalist to admit for GI bleed requiring transfusion   Final Clinical Impression(s) / ED Diagnoses Final diagnoses:  None    Rx / DC Orders ED Discharge Orders    None       Drenda Freeze, MD 04/29/19 [phone]

## 2019-04-29 NOTE — H&P (Signed)
History and Physical    Kyle [name] CMK:[id number] DOB: [birth year] DOA: 04/29/2019  PCP: Idelle Crouch, MD  Patient coming from: [facility]  I have personally briefly reviewed patient's old medical records in [place]  Chief Complaint: Abnormal labs  HPI: Kyle [name] is a 69 y.o. 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  Patient reports he has been seeing dark black-colored stool for several days now.  He has been feeling cold but otherwise denies any obvious bleeding including epistaxis, hemoptysis, hematemesis, hematuria, or bright red blood per rectum.  He denies any chest pain or dyspnea.  He was recently hospitalized from 04/04/2019-04/16/2019 for GI bleeding.  At time of that admission he was on both aspirin and Plavix for his recent stroke.  He had an upper endoscopy on 04/05/2019 which showed gastritis and mucosal changes in the duodenum.  He underwent colonoscopy on 04/09/2019 which showed a single nonbleeding colonic angiodysplastic lesion which was treated with a monopolar probe.  Diverticulosis in the entire examined colon also noted.  On discharge he was taken off aspirin and continued on Plavix alone as well as pantoprazole.  ED Course:  Initial vitals showed BP 111/62, pulse 82, RR 18, temp 97.8 Fahrenheit, SPO2 97% on room air.  Labs notable for hemoglobin 6.6, WBC 11.3, platelets 266,000, sodium 133, potassium 3.4, bicarb 25, BUN 8, creatinine 3.93, serum glucose 173, PT 12.5, INR 0.9, FOBT positive.  EDP discussed with on-call GI who will see in consultation.  Patient was started on IV Protonix  infusion.  EDP also discussed with on-call nephrology.  Patient was ordered to receive 2 unit PRBC transfusion.  The hospitalist service was consulted admit for further evaluation management.  Review of Systems: All systems reviewed and are negative except as documented in history of present illness above.   Past Medical History:  Diagnosis Date  . Anxiety   . BPH (benign prostatic hyperplasia)   . Chronic kidney disease   . Colon polyps   . COPD (chronic obstructive pulmonary disease) ([place])   . Depression   . Diabetes mellitus without complication ([place])   . Diverticulosis of colon   . Hyperlipidemia   . Hypertension   . Hypothyroidism   . Nephrolithiasis   . Seizures ([place])     Past Surgical History:  Procedure Laterality Date  . AV FISTULA PLACEMENT Left 03/30/2019   Procedure: right brachiocephalic fistula creation;  Surgeon: Marty Heck, MD;  Location: [place];  Service: Vascular;  Laterality: Left;  . BIOPSY  04/05/2019   Procedure: BIOPSY;  Surgeon: Wilford Corner, MD;  Location: [place];  Service: Endoscopy;;  . COLONOSCOPY    . COLONOSCOPY WITH PROPOFOL N/A 05/30/2015   Procedure: COLONOSCOPY WITH PROPOFOL;  Surgeon: Manya Silvas, MD;  Location[facility]COPY;  Service: Endoscopy;  Laterality: N/A;  . COLONOSCOPY WITH PROPOFOL N/A 12/01/2018   Procedure: COLONOSCOPY WITH PROPOFOL;  Surgeon: Toledo, [name], MD;  Location: [facility] ENDOSCOPY;  Service: Gastroenterology;  Laterality: N/A;  . COLONOSCOPY WITH PROPOFOL N/A 04/09/2019   Procedure: COLONOSCOPY WITH PROPOFOL;  Surgeon: Carol Ada, MD;  Location: [place];  Service: Endoscopy;  Laterality: N/A;  . ESOPHAGOGASTRODUODENOSCOPY (EGD) WITH  PROPOFOL N/A 04/05/2019   Procedure: ESOPHAGOGASTRODUODENOSCOPY (EGD) WITH PROPOFOL;  Surgeon: Wilford Corner, MD;  Location: [place];  Service: Endoscopy;  Laterality: N/A;  . HOT HEMOSTASIS N/A 04/09/2019   Procedure: HOT HEMOSTASIS (ARGON PLASMA  COAGULATION/BICAP);  Surgeon: Carol Ada, MD;  Location: [place];  Service: Endoscopy;  Laterality: N/A;  . INSERTION OF DIALYSIS CATHETER Right 03/30/2019   Procedure: Ultrasound guided right internal jugular tunneled dialysis catheter placement;  Surgeon: Marty Heck, MD;  Location: [facility] OR;  Service: Vascular;  Laterality: Right;  . kidney stone    . THYROIDECTOMY    . VARICOCELE EXCISION    . VIDEO BRONCHOSCOPY Bilateral 05/25/2016   Procedure: VIDEO BRONCHOSCOPY WITHOUT FLUORO;  Surgeon: Juanito Doom, MD;  Location: [facility] ENDOSCOPY;  Service: Cardiopulmonary;  Laterality: Bilateral;    Social History:  reports that he has been smoking cigarettes. He has a 40.00 pack-year smoking history. He has never used smokeless tobacco. He reports current alcohol use of about 8.0 standard drinks of alcohol per week. He reports that he does not use drugs.  No Known Allergies  Family History  Problem Relation Age of Onset  . Diabetes Mother   . Diabetes Maternal Grandmother   . Diabetes Maternal Grandfather   . Lung cancer Father   . Emphysema Paternal Grandfather      Prior to Admission medications   Medication Sig Start Date End Date Taking? Authorizing Provider  amLODipine (NORVASC) 2.5 MG tablet Take 1 tablet (2.5 mg total) by mouth daily. 03/20/19   Loletha Grayer, MD  atorvastatin (LIPITOR) 80 MG tablet Take 1 tablet (80 mg total) by mouth daily. 03/20/19 04/19/19  Loletha Grayer, MD  calcium acetate (PHOSLO) 667 MG capsule Take 2 capsules (1,334 mg total) by mouth 3 (three) times daily with meals. 03/20/19   Loletha Grayer, MD  clopidogrel (PLAVIX) 75 MG tablet Take 1 tablet (75 mg total) by mouth daily. 04/17/19 05/17/19  Kayleen Memos, DO  gentamicin cream (GARAMYCIN) 0.1 % Apply 1 application topically daily. 03/20/19   Loletha Grayer, MD  insulin lispro (HUMALOG) 100 UNIT/ML KwikPen 2 units subcutaneous injection for sugars 200-250, 3 units for sugars 251-300; 4 units  for sugars greater than 301 03/20/19   Wieting, Richard, MD  levETIRAcetam (KEPPRA) 500 MG tablet Take 500 mg by mouth 2 (two) times a day. 09/05/15   [provider]  levothyroxine (SYNTHROID) 200 MCG tablet Take 200 mcg by mouth every morning. 02/25/19   [provider]  losartan (COZAAR) 50 MG tablet Take 1 tablet (50 mg total) by mouth daily. 04/17/19 05/17/19  Kayleen Memos, DO  metoprolol succinate (TOPROL-XL) 50 MG 24 hr tablet Take 1 tablet (50 mg total) by mouth daily. Take with or immediately following a meal. 04/17/19 05/17/19  Kayleen Memos, DO  multivitamin (RENA-VIT) TABS tablet Take 1 tablet by mouth at bedtime. 03/20/19   Loletha Grayer, MD  Nutritional Supplements (FEEDING SUPPLEMENT, NEPRO CARB STEADY,) LIQD Take 237 mLs by mouth 2 (two) times daily between meals. 03/20/19   Loletha Grayer, MD  Pancrelipase, Lip-Prot-Amyl, 24000-76000 units CPEP Take 1 capsule by mouth 3 (three) times daily.    [provider]  pantoprazole (PROTONIX) 40 MG tablet Take 1 tablet (40 mg total) by mouth 2 (two) times daily. 04/16/19 05/16/19  Kayleen Memos, DO    Physical Exam: Vitals:   04/29/19 1901 04/29/19 1904 04/29/19 2138  BP: 111/62  (!) 113/47  Pulse: 84  85  Resp: 18  14  Temp: 97.8 F (36.6 C)  98.3 F (36.8 C)  TempSrc: Oral  Oral  SpO2: 97%    Weight:  93.9 kg   Height:  6' (1.829 m)    Constitutional: Chronically ill-appearing man resting supine in bed, NAD, calm, comfortable Eyes: PERRL, left-sided ptosis with compared cranial nerve palsy ENMT: Mucous membranes are dry. Posterior pharynx clear of any exudate or lesions.Normal dentition.  Neck: normal, supple, no masses. Respiratory: clear to auscultation anteriorly. Normal respiratory effort. No accessory muscle use.  Cardiovascular: Regular rate and rhythm, no murmurs / rubs / gallops. No extremity edema. 2+ pedal pulses.  HD cath in place right chest wall. Abdomen: no tenderness, no masses  palpated. No hepatosplenomegaly. Bowel sounds positive.  Musculoskeletal: no clubbing / cyanosis. No joint deformity upper and lower extremities. Good ROM, no contractures. Normal muscle tone.  Skin: no rashes, lesions, ulcers. No induration Neurologic: CN 2-12 grossly intact. Sensation intact, moving all extremities equally Psychiatric: Alert and oriented x 3. Normal mood.     Labs on Admission: I have personally reviewed following labs and imaging studies  CBC: Recent Labs  Lab 04/29/19 1910  WBC 11.3*  NEUTROABS 9.8*  HGB 6.6*  HCT 20.3*  MCV 100.5*  PLT 619   Basic Metabolic Panel: Recent Labs  Lab 04/29/19 1910  NA 133*  K 3.4*  CL 92*  CO2 25  GLUCOSE 173*  BUN 8  CREATININE 3.93*  CALCIUM 7.4*   GFR: Estimated Creatinine Clearance: 20.8 mL/min (A) (by C-G formula based on SCr of 3.93 mg/dL (H)). Liver Function Tests: Recent Labs  Lab 04/29/19 1910  AST 21  ALT 11  ALKPHOS 85  BILITOT 1.3*  PROT 5.5*  ALBUMIN 2.5*   No results for input(s): LIPASE, AMYLASE in the last 168 hours. No results for input(s): AMMONIA in the last 168 hours. Coagulation Profile: Recent Labs  Lab 04/29/19 1910  INR 0.9   Cardiac Enzymes: No results for input(s): CKTOTAL, CKMB, CKMBINDEX, TROPONINI in the last 168 hours. BNP (last 3 results) No results for input(s): PROBNP in the last 8760 hours. HbA1C: No results for input(s): HGBA1C in the last 72 hours. CBG: No results for input(s): GLUCAP in the last 168 hours. Lipid Profile: No results for input(s): CHOL, HDL, LDLCALC, TRIG, CHOLHDL, LDLDIRECT in the last 72 hours. Thyroid Function Tests: No results for input(s): TSH, T4TOTAL, FREET4, T3FREE, THYROIDAB in the last 72 hours. Anemia Panel: No results for input(s): VITAMINB12, FOLATE, FERRITIN, TIBC, IRON, RETICCTPCT in the last 72 hours. Urine analysis:    Component Value Date/Time   COLORURINE YELLOW 03/22/2019 1622   APPEARANCEUR CLEAR 03/22/2019 1622    APPEARANCEUR Clear 08/08/2013 1947   LABSPEC 1.018 03/22/2019 1622   LABSPEC 1.012 08/08/2013 1947   PHURINE 5.0 03/22/2019 1622   GLUCOSEU >=500 (A) 03/22/2019 1622   GLUCOSEU 50 mg/dL 08/08/2013 1947   HGBUR NEGATIVE 03/22/2019 [place] 03/22/2019 1622   BILIRUBINUR Negative 08/08/2013 1947   KETONESUR NEGATIVE 03/22/2019 1622   PROTEINUR >=300 (A) 03/22/2019 1622   NITRITE NEGATIVE 03/22/2019 1622   LEUKOCYTESUR NEGATIVE 03/22/2019 1622   LEUKOCYTESUR Negative 08/08/2013 1947    Radiological Exams on Admission: No results found.  EKG: Not performed.  Assessment/Plan Principal Problem:   Acute on chronic blood loss anemia Active Problems:   Chronic obstructive pulmonary disease (HCC)   Hyperlipidemia associated with type 2 diabetes mellitus (HCC)   Seizure disorder (HCC)   Insulin dependent type 2 diabetes  mellitus ([place])   ESRD (end stage renal disease) ([place])   Hypertension associated with diabetes ([place])   Hypothyroidism   History of CVA (cerebrovascular accident)  Kyle [name] is a 69 y.o. 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  Acute on chronic blood loss anemia: EGD 04/05/2019 showed gastritis with duodenal peptic ulcer injury.  Colonoscopy 04/09/2019 showed single nonbleeding colonic angiodysplastic lesion treated with monopolar probe and diverticulosis throughout the examined colon.  Patient returns with melena, hemoglobin 6.6 on admission. -Patient receiving transfusion of 2 units PRBCs -Continue Protonix -Hold Plavix -Keep n.p.o. -GI to follow  History of left midbrain CVA in January 2021: With residual  right-sided weakness, left-sided ptosis with 3rd cranial nerve palsy.  Previously on both aspirin and Plavix but aspirin was discontinued on last admission due to GI bleeding and he was resumed on Plavix alone. -Holding Plavix as above -Continue atorvastatin  ESRD on MWF HD: Completed HD prior to admission.  No emergent need for dialysis.  Will need nephrology consultation for usual dialysis if remains in hospital.  Insulin-dependent type 2 diabetes: Continue sensitive SSI q4h while NPO.  Hypertension: Holding home losartan, Toprol-XL, amlodipine for now with borderline low blood pressure.  Hypothyroidism: Resume Synthroid.  Seizure disorder: Resume Keppra.  DVT prophylaxis: SCDs Code Status: Full code, confirmed with patient Family Communication: Discussed with patient's wife Curry Seefeldt by phone, [phone] Disposition Plan: From [facility], likely discharge to prior facility pending resolution of GI bleeding, gastroenterology evaluation and recommendations, and clinical progress. Consults called: GI and nephrology consulted by EDP Admission status: Observation   Zada Finders MD [facility]  If 7PM-7AM, please contact night-coverage [email or web address]  04/29/2019, 9:46 PM

## 2019-04-29 NOTE — ED Notes (Signed)
[phone] pts brother Dominica Severin wants an update, lives out of town

## 2019-04-29 NOTE — ED Triage Notes (Signed)
Pt had labs drawn this  AM at [facility] place and resulted this afternoon with low Hgb.   Had dialysis treatment today as scheduled, with full treatment completed.  Hx of previous CVA with R sided weakness.  States this A\M he was disoriented with back, abd and head pain.  States only pain at this time is substernal with deep breathing.  No N/V/D but has noted dark stools x 2-3 days.  Hx of GI bleed in the past.

## 2019-04-30 DIAGNOSIS — Z8673 Personal history of transient ischemic attack (TIA), and cerebral infarction without residual deficits: Secondary | ICD-10-CM | POA: Diagnosis not present

## 2019-04-30 DIAGNOSIS — N186 End stage renal disease: Secondary | ICD-10-CM | POA: Diagnosis present

## 2019-04-30 DIAGNOSIS — D179 Benign lipomatous neoplasm, unspecified: Secondary | ICD-10-CM | POA: Diagnosis present

## 2019-04-30 DIAGNOSIS — Z515 Encounter for palliative care: Secondary | ICD-10-CM | POA: Diagnosis not present

## 2019-04-30 DIAGNOSIS — K922 Gastrointestinal hemorrhage, unspecified: Secondary | ICD-10-CM | POA: Diagnosis not present

## 2019-04-30 DIAGNOSIS — F419 Anxiety disorder, unspecified: Secondary | ICD-10-CM | POA: Diagnosis present

## 2019-04-30 DIAGNOSIS — E1159 Type 2 diabetes mellitus with other circulatory complications: Secondary | ICD-10-CM | POA: Diagnosis not present

## 2019-04-30 DIAGNOSIS — K5730 Diverticulosis of large intestine without perforation or abscess without bleeding: Secondary | ICD-10-CM | POA: Diagnosis present

## 2019-04-30 DIAGNOSIS — D62 Acute posthemorrhagic anemia: Secondary | ICD-10-CM | POA: Diagnosis present

## 2019-04-30 DIAGNOSIS — H4902 Third [oculomotor] nerve palsy, left eye: Secondary | ICD-10-CM | POA: Diagnosis present

## 2019-04-30 DIAGNOSIS — I120 Hypertensive chronic kidney disease with stage 5 chronic kidney disease or end stage renal disease: Secondary | ICD-10-CM | POA: Diagnosis present

## 2019-04-30 DIAGNOSIS — E1122 Type 2 diabetes mellitus with diabetic chronic kidney disease: Secondary | ICD-10-CM | POA: Diagnosis present

## 2019-04-30 DIAGNOSIS — F329 Major depressive disorder, single episode, unspecified: Secondary | ICD-10-CM | POA: Diagnosis present

## 2019-04-30 DIAGNOSIS — I951 Orthostatic hypotension: Secondary | ICD-10-CM | POA: Diagnosis not present

## 2019-04-30 DIAGNOSIS — N2581 Secondary hyperparathyroidism of renal origin: Secondary | ICD-10-CM | POA: Diagnosis present

## 2019-04-30 DIAGNOSIS — Z20822 Contact with and (suspected) exposure to covid-19: Secondary | ICD-10-CM | POA: Diagnosis present

## 2019-04-30 DIAGNOSIS — N400 Enlarged prostate without lower urinary tract symptoms: Secondary | ICD-10-CM | POA: Diagnosis present

## 2019-04-30 DIAGNOSIS — E8889 Other specified metabolic disorders: Secondary | ICD-10-CM | POA: Diagnosis present

## 2019-04-30 DIAGNOSIS — J449 Chronic obstructive pulmonary disease, unspecified: Secondary | ICD-10-CM | POA: Diagnosis present

## 2019-04-30 DIAGNOSIS — F17210 Nicotine dependence, cigarettes, uncomplicated: Secondary | ICD-10-CM | POA: Diagnosis present

## 2019-04-30 DIAGNOSIS — K5520 Angiodysplasia of colon without hemorrhage: Secondary | ICD-10-CM | POA: Diagnosis present

## 2019-04-30 DIAGNOSIS — K31819 Angiodysplasia of stomach and duodenum without bleeding: Secondary | ICD-10-CM | POA: Diagnosis present

## 2019-04-30 DIAGNOSIS — E1169 Type 2 diabetes mellitus with other specified complication: Secondary | ICD-10-CM | POA: Diagnosis present

## 2019-04-30 DIAGNOSIS — I69351 Hemiplegia and hemiparesis following cerebral infarction affecting right dominant side: Secondary | ICD-10-CM | POA: Diagnosis not present

## 2019-04-30 DIAGNOSIS — E785 Hyperlipidemia, unspecified: Secondary | ICD-10-CM | POA: Diagnosis present

## 2019-04-30 DIAGNOSIS — G40909 Epilepsy, unspecified, not intractable, without status epilepticus: Secondary | ICD-10-CM | POA: Diagnosis present

## 2019-04-30 DIAGNOSIS — Z7189 Other specified counseling: Secondary | ICD-10-CM | POA: Diagnosis not present

## 2019-04-30 DIAGNOSIS — K921 Melena: Secondary | ICD-10-CM | POA: Diagnosis present

## 2019-04-30 LAB — CBC
HCT: 25.5 % — ABNORMAL LOW (ref 39.0–52.0)
Hemoglobin: 8.5 g/dL — ABNORMAL LOW (ref 13.0–17.0)
MCH: 30.8 pg (ref 26.0–34.0)
MCHC: 33.3 g/dL (ref 30.0–36.0)
MCV: 92.4 fL (ref 80.0–100.0)
Platelets: 243 10*3/uL (ref 150–400)
RBC: 2.76 MIL/uL — ABNORMAL LOW (ref 4.22–5.81)
RDW: 17.9 % — ABNORMAL HIGH (ref 11.5–15.5)
WBC: 8.1 10*3/uL (ref 4.0–10.5)
nRBC: 0 % (ref 0.0–0.2)

## 2019-04-30 LAB — BASIC METABOLIC PANEL
Anion gap: 12 (ref 5–15)
BUN: 11 mg/dL (ref 8–23)
CO2: 27 mmol/L (ref 22–32)
Calcium: 7.3 mg/dL — ABNORMAL LOW (ref 8.9–10.3)
Chloride: 97 mmol/L — ABNORMAL LOW (ref 98–111)
Creatinine, Ser: 4.75 mg/dL — ABNORMAL HIGH (ref 0.61–1.24)
GFR calc Af Amer: 13 mL/min — ABNORMAL LOW (ref >60)
GFR calc non Af Amer: 12 mL/min — ABNORMAL LOW (ref >60)
Glucose, Bld: 95 mg/dL (ref 70–99)
Potassium: 3.3 mmol/L — ABNORMAL LOW (ref 3.5–5.1)
Sodium: 136 mmol/L (ref 135–145)

## 2019-04-30 LAB — GLUCOSE, CAPILLARY
Glucose-Capillary: 106 mg/dL — ABNORMAL HIGH (ref 70–99)
Glucose-Capillary: 111 mg/dL — ABNORMAL HIGH (ref 70–99)
Glucose-Capillary: 98 mg/dL (ref 70–99)

## 2019-04-30 LAB — CBG MONITORING, ED
Glucose-Capillary: 108 mg/dL — ABNORMAL HIGH (ref 70–99)
Glucose-Capillary: 87 mg/dL (ref 70–99)
Glucose-Capillary: 107 mg/dL — ABNORMAL HIGH (ref 70–99)

## 2019-04-30 LAB — SARS CORONAVIRUS 2 (TAT 6-24 HRS): SARS Coronavirus 2: NEGATIVE

## 2019-04-30 MED ORDER — INSULIN ASPART 100 UNIT/ML ~~LOC~~ SOLN
0.0000 [IU] | SUBCUTANEOUS | Status: DC
  Administered 2019-05-02 (×2): 3 [IU] via SUBCUTANEOUS
  Administered 2019-05-02: 2 [IU] via SUBCUTANEOUS
  Administered 2019-05-03: 1 [IU] via SUBCUTANEOUS
  Administered 2019-05-03: 2 [IU] via SUBCUTANEOUS
  Administered 2019-05-03: 3 [IU] via SUBCUTANEOUS

## 2019-04-30 MED ORDER — ATORVASTATIN CALCIUM 80 MG PO TABS
80.0000 mg | ORAL_TABLET | Freq: Every day | ORAL | Status: DC
  Administered 2019-04-30 – 2019-05-06 (×8): 80 mg via ORAL
  Filled 2019-04-30 (×8): qty 1

## 2019-04-30 MED ORDER — AMLODIPINE BESYLATE 5 MG PO TABS: 2.5000 mg | ORAL_TABLET | Freq: Every day | ORAL | Status: DC

## 2019-04-30 MED ORDER — METOPROLOL SUCCINATE ER 50 MG PO TB24
50.0000 mg | ORAL_TABLET | Freq: Every day | ORAL | Status: DC
  Administered 2019-05-02 – 2019-05-07 (×5): 50 mg via ORAL
  Filled 2019-04-30 (×8): qty 1

## 2019-04-30 MED ORDER — AMLODIPINE BESYLATE 5 MG PO TABS
2.5000 mg | ORAL_TABLET | Freq: Every day | ORAL | Status: DC
  Administered 2019-05-02: 2.5 mg via ORAL
  Filled 2019-04-30 (×3): qty 1

## 2019-04-30 MED ORDER — CHLORHEXIDINE GLUCONATE CLOTH 2 % EX PADS
6.0000 | MEDICATED_PAD | Freq: Every day | CUTANEOUS | Status: DC
  Administered 2019-04-30 – 2019-05-03 (×4): 6 via TOPICAL

## 2019-04-30 MED ORDER — POTASSIUM CHLORIDE CRYS ER 20 MEQ PO TBCR
20.0000 meq | EXTENDED_RELEASE_TABLET | Freq: Once | ORAL | Status: AC
  Administered 2019-04-30: 20 meq via ORAL
  Filled 2019-04-30: qty 1

## 2019-04-30 MED ORDER — LEVOTHYROXINE SODIUM 100 MCG PO TABS
200.0000 ug | ORAL_TABLET | Freq: Every day | ORAL | Status: DC
  Administered 2019-04-30 – 2019-05-07 (×7): 200 ug via ORAL
  Filled 2019-04-30 (×8): qty 2

## 2019-04-30 MED ORDER — LEVETIRACETAM 500 MG PO TABS
500.0000 mg | ORAL_TABLET | Freq: Two times a day (BID) | ORAL | Status: DC
  Administered 2019-04-30 – 2019-05-07 (×15): 500 mg via ORAL
  Filled 2019-04-30 (×15): qty 1

## 2019-04-30 NOTE — Progress Notes (Signed)
Notified MD Pahwani that pt has refused medications and labs drawn. Wife called and was updated. Pt is talking to wife on the phone.  Paulla Fore, RN, BSN

## 2019-04-30 NOTE — Progress Notes (Signed)
PROGRESS NOTE    Kyle [name]  ZDG:[id number] DOB: 07-11-1950 DOA: 04/29/2019 PCP: Idelle Crouch, MD   Brief Narrative:  HPI: Kyle [name] is a 69 y.o. 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  Patient reports he has been seeing dark black-colored stool for several days now.  He has been feeling cold but otherwise denies any obvious bleeding including epistaxis, hemoptysis, hematemesis, hematuria, or bright red blood per rectum.  He denies any chest pain or dyspnea.  He was recently hospitalized from 04/04/2019-04/16/2019 for GI bleeding.  At time of that admission he was on both aspirin and Plavix for his recent stroke.  He had an upper endoscopy on 04/05/2019 which showed gastritis and mucosal changes in the duodenum.  He underwent colonoscopy on 04/09/2019 which showed a single nonbleeding colonic angiodysplastic lesion which was treated with a monopolar probe.  Diverticulosis in the entire examined colon also noted.  On discharge he was taken off aspirin and continued on Plavix alone as well as pantoprazole.  ED Course:  Initial vitals showed BP 111/62, pulse 82, RR 18, temp 97.8 Fahrenheit, SPO2 97% on room air.  Labs notable for hemoglobin 6.6, WBC 11.3, platelets 266,000, sodium 133, potassium 3.4, bicarb 25, BUN 8, creatinine 3.93, serum glucose 173, PT 12.5, INR 0.9, FOBT positive.  EDP discussed with on-call GI who will see in consultation.  Patient was started on IV Protonix infusion.  EDP also discussed with on-call nephrology.  Patient was ordered to receive 2 unit PRBC transfusion.  The hospitalist service was  consulted admit for further evaluation management.  Assessment & Plan:   Principal Problem:   Acute on chronic blood loss anemia Active Problems:   Chronic obstructive pulmonary disease (HCC)   Hyperlipidemia associated with type 2 diabetes mellitus (HCC)   Seizure disorder (HCC)   Insulin dependent type 2 diabetes mellitus (HCC)   ESRD (end stage renal disease) ([place])   Hypertension associated with diabetes ([place])   Hypothyroidism   History of CVA (cerebrovascular accident)   Acute on chronic blood loss anemia/GI bleeding, most likely upper: EGD 04/05/2019 showed gastritis with duodenal peptic ulcer injury.  Colonoscopy 04/09/2019 showed single nonbleeding colonic angiodysplastic lesion treated with monopolar probe and diverticulosis throughout the examined colon.  Patient returns with melena, hemoglobin 6.6 on admission.  Received 2 units of PRBC transfusion and started on Protonix.  GI just saw him.  No plans of EGD or colonoscopy.  Plan for capsule endoscopy tomorrow.  Continue PPI.  Appreciate GI help.  On clear liquid diet now but n.p.o. from midnight.  History of left midbrain CVA in January 2021: With residual right-sided weakness, left-sided ptosis with 3rd cranial nerve palsy.  Previously on both aspirin and Plavix but aspirin was discontinued on last admission due to GI bleeding and he was resumed on Plavix alone.  Continue to hold Plavix.  ESRD on MWF HD: Completed HD prior to admission.  Nephrology on board.  Appreciate their help.  Insulin-dependent type 2 diabetes: Blood sugar fairly controlled.  Continue sensitive SSI q4h while NPO.  Essential hypertension: Blood pressure slightly elevated.  Resume Toprol-XL and amlodipine but hold losartan.  Hypothyroidism: Continue Synthroid.  Seizure disorder: Continue  Keppra.   DVT prophylaxis: SCD, avoiding heparin products due to GI bleed   Code Status: Full Code  Family Communication:  None present at bedside.  Plan of care  discussed with patient in length and he verbalized understanding and agreed with it. Patient is from: Home Disposition Plan: Likely home Barriers to discharge: Active GI bleeding needing further GI work-up which is in process.  Need to monitor H&H closely.  Need IV Protonix.   Estimated body mass index is 28.07 kg/m as calculated from the following:   Height as of this encounter: 6' (1.829 m).   Weight as of this encounter: 93.9 kg.      Nutritional status:               Consultants:   GI  Procedures:   None  Antimicrobials:   None   Subjective: Patient seen and examined earlier.  He had no complaints.  He had seen 1-2 more bowel movements which were black.  Objective: Vitals:   04/30/19 1030 04/30/19 1100 04/30/19 1130 04/30/19 1200  BP: (!) 157/62 (!) 159/65 (!) 147/64 (!) 143/65  Pulse: 75 78 76 77  Resp: 17 (!) 9 13 14   Temp:      TempSrc:      SpO2: 97% 97% 99% 100%  Weight:      Height:        Intake/Output Summary (Last 24 hours) at 04/30/2019 1424 Last data filed at 04/30/2019 0934 Gross per 24 hour  Intake 1145 ml  Output --  Net 1145 ml   Filed Weights   04/29/19 1904  Weight: 93.9 kg    Examination:  General exam: Appears calm and comfortable  Respiratory system: Clear to auscultation. Respiratory effort normal. Cardiovascular system: S1 & S2 heard, RRR. No JVD, murmurs, rubs, gallops or clicks. No pedal edema. Gastrointestinal system: Abdomen is nondistended, soft and epigastric and right upper quadrant tenderness. No organomegaly or masses felt. Normal bowel sounds heard. Central nervous system: Alert and oriented. No focal neurological deficits. Extremities: Symmetric 5 x 5 power. Skin: No rashes, lesions or ulcers Psychiatry: Judgement and insight appear normal. Mood & affect appropriate.    Data Reviewed: I have personally reviewed following labs and imaging studies  CBC: Recent Labs  Lab 04/29/19 1910 04/30/19 0442   WBC 11.3* 8.1  NEUTROABS 9.8*  --   HGB 6.6* 8.5*  HCT 20.3* 25.5*  MCV 100.5* 92.4  PLT 266 027   Basic Metabolic Panel: Recent Labs  Lab 04/29/19 1910 04/30/19 0442  NA 133* 136  K 3.4* 3.3*  CL 92* 97*  CO2 25 27  GLUCOSE 173* 95  BUN 8 11  CREATININE 3.93* 4.75*  CALCIUM 7.4* 7.3*   GFR: Estimated Creatinine Clearance: 17.2 mL/min (A) (by C-G formula based on SCr of 4.75 mg/dL (H)). Liver Function Tests: Recent Labs  Lab 04/29/19 1910  AST 21  ALT 11  ALKPHOS 85  BILITOT 1.3*  PROT 5.5*  ALBUMIN 2.5*   No results for input(s): LIPASE, AMYLASE in the last 168 hours. No results for input(s): AMMONIA in the last 168 hours. Coagulation Profile: Recent Labs  Lab 04/29/19 1910  INR 0.9   Cardiac Enzymes: No results for input(s): CKTOTAL, CKMB, CKMBINDEX, TROPONINI in the last 168 hours. BNP (last 3 results) No results for input(s): PROBNP in the last 8760 hours. HbA1C: No results for input(s): HGBA1C in the last 72 hours. CBG: Recent Labs  Lab 04/30/19 0120 04/30/19 0438 04/30/19 0759 04/30/19  [place] 107* 106*   Lipid Profile: No results for input(s): CHOL, HDL, LDLCALC, TRIG, CHOLHDL, LDLDIRECT in the last 72 hours. Thyroid Function Tests: No results for input(s): TSH, T4TOTAL, FREET4, T3FREE, THYROIDAB in the last 72 hours. Anemia Panel: No results for input(s): VITAMINB12, FOLATE, FERRITIN, TIBC, IRON, RETICCTPCT in the last 72 hours. Sepsis Labs: No results for input(s): PROCALCITON, LATICACIDVEN in the last 168 hours.  No results found for this or any previous visit (from the past 240 hour(s)).    Radiology Studies: No results found.  Scheduled Meds: . atorvastatin  80 mg Oral QHS  . Chlorhexidine Gluconate Cloth  6 each Topical Q0600  . insulin aspart  0-9 Units Subcutaneous Q4H  . levETIRAcetam  500 mg Oral BID  . levothyroxine  200 mcg Oral QAC breakfast   Continuous Infusions: . pantoprozole (PROTONIX) infusion 8  mg/hr (04/30/19 0935)     LOS: 0 days   Time spent: 35 minutes   Darliss Cheney, MD [facility]  04/30/2019, 2:24 PM   To contact the attending provider between 7A-7P or the covering provider during after hours 7P-7A, please log into the web site [email or web address].

## 2019-04-30 NOTE — Consult Note (Addendum)
[facility] Renal Consultation Note    Indication for Consultation:  Management of ESRD/hemodialysis, anemia, hypertension/volume, and secondary hyperparathyroidism.  HPI: Kyle [name] is a 69 y.o. male with a PMH including ESRD on dialysis, DM, COPD, seizures, stroke and diverticulosis who presented to the ED on 04/29/19 with dark colored stools for several days. Hgb at dialysis was 6.1. Pt has history of GI bleed in the past  (most recently 04/04/19-04/16/19) and had not been following up with GI outpatient. On presentation to the ED, VSS, Hgb 6.6, K+ 3.4, BUN 8, Cr 3.93. FOBT+. Patient was started on IV PPI and received 2 units PRBC. Last outpatient hemoglobin was 7/3 on 2/24 and patient has been receiving mircera 111mcg q 2 weeks.   Patient dialyzes MWF at [facility]. He did complete 3.5 out of 4 hours of HD on 04/29/19 and left 3kg below his EDW of 86.5kg. Denies any issues during HD.  Patient endorses poor appetite and weight loss. Denies abdominal pain, nausea, vomiting, shortness of breath, cough, CP, palpitations, dizziness, fever/chlls, and edema.   Past Medical History:  Diagnosis Date  . Anxiety   . BPH (benign prostatic hyperplasia)   . Chronic kidney disease   . Colon polyps   . COPD (chronic obstructive pulmonary disease) ([place])   . Depression   . Diabetes mellitus without complication ([place])   . Diverticulosis of colon   . Hyperlipidemia   . Hypertension   . Hypothyroidism   . Nephrolithiasis   . Seizures ([place])    Past Surgical History:  Procedure Laterality Date  . AV FISTULA PLACEMENT Left 03/30/2019   Procedure: right brachiocephalic fistula creation;  Surgeon: Marty Heck, MD;  Location: [place];  Service: Vascular;  Laterality: Left;  . BIOPSY  04/05/2019   Procedure: BIOPSY;  Surgeon: Wilford Corner, MD;  Location: [place];  Service: Endoscopy;;  . COLONOSCOPY    . COLONOSCOPY WITH PROPOFOL N/A 05/30/2015    Procedure: COLONOSCOPY WITH PROPOFOL;  Surgeon: Manya Silvas, MD;  Location: [facility] ENDOSCOPY;  Service: Endoscopy;  Laterality: N/A;  . COLONOSCOPY WITH PROPOFOL N/A 12/01/2018   Procedure: COLONOSCOPY WITH PROPOFOL;  Surgeon: Toledo, [name], MD;  Location: [facility] ENDOSCOPY;  Service: Gastroenterology;  Laterality: N/A;  . COLONOSCOPY WITH PROPOFOL N/A 04/09/2019   Procedure: COLONOSCOPY WITH PROPOFOL;  Surgeon: Carol Ada, MD;  Location: [place];  Service: Endoscopy;  Laterality: N/A;  . ESOPHAGOGASTRODUODENOSCOPY (EGD) WITH PROPOFOL N/A 04/05/2019   Procedure: ESOPHAGOGASTRODUODENOSCOPY (EGD) WITH PROPOFOL;  Surgeon: Wilford Corner, MD;  Location: [place];  Service: Endoscopy;  Laterality: N/A;  . HOT HEMOSTASIS N/A 04/09/2019   Procedure: HOT HEMOSTASIS (ARGON PLASMA COAGULATION/BICAP);  Surgeon: Carol Ada, MD;  Location: [place];  Service: Endoscopy;  Laterality: N/A;  . INSERTION OF DIALYSIS CATHETER Right 03/30/2019   Procedure: Ultrasound guided right internal jugular tunneled dialysis catheter placement;  Surgeon: Marty Heck, MD;  Location: [facility];  Service: Vascular;  Laterality: Right;  . kidney stone    . THYROIDECTOMY    . VARICOCELE EXCISION    . VIDEO BRONCHOSCOPY Bilateral 05/25/2016   Procedure: VIDEO BRONCHOSCOPY WITHOUT FLUORO;  Surgeon: Juanito Doom, MD;  Location: [facility] ENDOSCOPY;  Service: Cardiopulmonary;  Laterality: Bilateral;   Family History  Problem Relation Age of Onset  . Diabetes Mother   . Diabetes Maternal Grandmother   . Diabetes Maternal Grandfather   . Lung cancer Father   . Emphysema Paternal Grandfather    Social History:  reports that he has been smoking cigarettes. He has a 40.00 pack-year smoking history. He has never used smokeless tobacco. He reports current alcohol use of about 8.0 standard drinks of alcohol per week. He reports that he does not use drugs.  ROS: As per HPI otherwise negative.  Physical  Exam: Vitals:   04/30/19 0830 04/30/19 0900 04/30/19 0930 04/30/19 1000  BP: (!) 156/68 (!) 153/65 (!) 161/66 (!) 152/69  Pulse: 75 79 81 75  Resp: 13 15 13 18   Temp:      TempSrc:      SpO2: 98% 99% 99% 98%  Weight:      Height:         General: Well developed, chronically ill appearing male in NAD Head: Normocephalic, atraumatic, L eye ptosis, mucus membranes are moist. Neck: Supple without lymphadenopathy/masses. JVD not elevated. Lungs: Clear bilaterally to auscultation without wheezes, rales, or rhonchi. Breathing is unlabored. Heart: RRR with normal S1, S2. No murmurs, rubs, or gallops appreciated. Abdomen: Soft, non-tender, non-distended with normoactive bowel sounds. No rebound/guarding. No obvious abdominal masses. Lower extremities: No edema bilateral lower extremities Neuro: Alert, Moves all extremities spontaneously. Psych:  Responds to questions appropriately with a normal affect. Dialysis Access: [facility] without surrounding erythema/drainage. Maturing LUE AVF + bruit  No Known Allergies Prior to Admission medications   Medication Sig Start Date End Date Taking? Authorizing Provider  acetaminophen (TYLENOL) 500 MG tablet Take 1,000 mg by mouth daily. 04/21/19 05/05/19 Yes [provider]  amLODipine (NORVASC) 2.5 MG tablet Take 1 tablet (2.5 mg total) by mouth daily. 03/20/19  Yes Wieting, Richard, MD  atorvastatin (LIPITOR) 80 MG tablet Take 1 tablet (80 mg total) by mouth daily. Patient taking differently: Take 80 mg by mouth at bedtime.  03/20/19 04/29/19 Yes Wieting, Richard, MD  B Complex-C-Folic Acid (NEPHRO-VITE PO) Take 1 tablet by mouth daily.   Yes [provider]  calcium acetate (PHOSLO) 667 MG capsule Take 2 capsules (1,334 mg total) by mouth 3 (three) times daily with meals. 03/20/19  Yes Wieting, Richard, MD  clopidogrel (PLAVIX) 75 MG tablet Take 1 tablet (75 mg total) by mouth daily. 04/17/19 05/17/19 Yes Kayleen Memos, DO  ferrous sulfate  (FERROUSUL) 325 (65 FE) MG tablet Take 325 mg by mouth daily with breakfast. 04/21/19 05/19/19 Yes [provider]  gentamicin cream (GARAMYCIN) 0.1 % Apply 1 application topically daily. Patient taking differently: Apply 1 application topically See admin instructions. Apply to affected area once daily for infection 03/20/19  Yes Wieting, Richard, MD  insulin lispro (HUMALOG) 100 UNIT/ML KwikPen 2 units subcutaneous injection for sugars 200-250, 3 units for sugars 251-300; 4 units for sugars greater than 301 Patient taking differently: Inject 0-12 Units into the skin See admin instructions. Inject 0-12 units into the skin three times a day before meals, depending on BGL: <70 = CALL MD; 70-200 = give nothing; 201-250 = 2 units; 251-300 = 4 units; 301-350 = 6 units; 351-400 = 8 units; 401-450 = 10 units; 451-600 = 12 units 03/20/19  Yes Wieting, Richard, MD  levETIRAcetam (KEPPRA) 500 MG tablet Take 500 mg by mouth 2 (two) times a day. 09/05/15  Yes [provider]  levothyroxine (SYNTHROID) 200 MCG tablet Take 200 mcg by mouth every morning. 02/25/19  Yes [provider]  losartan (COZAAR) 50 MG tablet Take 1 tablet (50 mg total) by mouth daily. 04/17/19 05/17/19 Yes Hall, Lorenda Cahill, DO  Menthol, Topical Analgesic, (BIOFREEZE) 4 % GEL Apply 1 application  topically See admin instructions. Apply to back, chest, and abdominal wall twice daily for 10 days 04/21/19 05/01/19 Yes [provider]  metoprolol succinate (TOPROL-XL) 50 MG 24 hr tablet Take 1 tablet (50 mg total) by mouth daily. Take with or immediately following a meal. 04/17/19 05/17/19 Yes Hall, Carole N, DO  nicotine (NICODERM CQ - DOSED IN MG/24 HOURS) 14 mg/24hr patch Place 14 mg onto the skin daily.   Yes [provider]  Pancrelipase, Lip-Prot-Amyl, 24000-76000 units CPEP Take 1 capsule by mouth 3 (three) times daily.   Yes [provider]  pantoprazole (PROTONIX) 40 MG tablet Take 1 tablet (40 mg  total) by mouth 2 (two) times daily. 04/16/19 05/16/19 Yes Hall, Lorenda Cahill, DO  polyethylene glycol powder (GLYCOLAX/MIRALAX) 17 GM/SCOOP powder Take 17 g by mouth daily.   Yes [provider]  multivitamin (RENA-VIT) TABS tablet Take 1 tablet by mouth at bedtime. Patient not taking: Reported on 04/29/2019 03/20/19   Loletha Grayer, MD  Nutritional Supplements (FEEDING SUPPLEMENT, NEPRO CARB STEADY,) LIQD Take 237 mLs by mouth 2 (two) times daily between meals. Patient not taking: Reported on 04/29/2019 03/20/19   Loletha Grayer, MD   Current Facility-Administered Medications  Medication Dose Route Frequency Provider Last Rate Last Admin  . acetaminophen (TYLENOL) tablet 650 mg  650 mg Oral Q6H PRN Lenore Cordia, MD       Or  . acetaminophen (TYLENOL) suppository 650 mg  650 mg Rectal Q6H PRN Zada Finders R, MD      . atorvastatin (LIPITOR) tablet 80 mg  80 mg Oral QHS Lenore Cordia, MD   80 mg at 04/30/19 0316  . insulin aspart (novoLOG) injection 0-9 Units  0-9 Units Subcutaneous Q4H Zada Finders R, MD      . levETIRAcetam (KEPPRA) tablet 500 mg  500 mg Oral BID Lenore Cordia, MD   500 mg at 04/30/19 0934  . levothyroxine (SYNTHROID) tablet 200 mcg  200 mcg Oral QAC breakfast Lenore Cordia, MD   200 mcg at 04/30/19 [phone]  . pantoprazole (PROTONIX) 80 mg in sodium chloride 0.9 % 100 mL (0.8 mg/mL) infusion  8 mg/hr Intravenous Continuous Drenda Freeze, MD 10 mL/hr at 04/30/19 0935 8 mg/hr at 04/30/19 0935   Current Outpatient Medications  Medication Sig Dispense Refill  . acetaminophen (TYLENOL) 500 MG tablet Take 1,000 mg by mouth daily.    [name] amLODipine (NORVASC) 2.5 MG tablet Take 1 tablet (2.5 mg total) by mouth daily.    [name] atorvastatin (LIPITOR) 80 MG tablet Take 1 tablet (80 mg total) by mouth daily. (Patient taking differently: Take 80 mg by mouth at bedtime. ) 30 tablet 0  . B Complex-C-Folic Acid (NEPHRO-VITE PO) Take 1 tablet by mouth daily.    . calcium acetate  (PHOSLO) 667 MG capsule Take 2 capsules (1,334 mg total) by mouth 3 (three) times daily with meals. 180 capsule 0  . clopidogrel (PLAVIX) 75 MG tablet Take 1 tablet (75 mg total) by mouth daily. 30 tablet 0  . ferrous sulfate (FERROUSUL) 325 (65 FE) MG tablet Take 325 mg by mouth daily with breakfast.    . gentamicin cream (GARAMYCIN) 0.1 % Apply 1 application topically daily. (Patient taking differently: Apply 1 application topically See admin instructions. Apply to affected area once daily for infection) 15 g 0  . insulin lispro (HUMALOG) 100 UNIT/ML KwikPen 2 units subcutaneous injection for sugars 200-250, 3 units for sugars 251-300; 4 units for sugars greater than  301 (Patient taking differently: Inject 0-12 Units into the skin See admin instructions. Inject 0-12 units into the skin three times a day before meals, depending on BGL: <70 = CALL MD; 70-200 = give nothing; 201-250 = 2 units; 251-300 = 4 units; 301-350 = 6 units; 351-400 = 8 units; 401-450 = 10 units; 451-600 = 12 units) 15 mL 11  . levETIRAcetam (KEPPRA) 500 MG tablet Take 500 mg by mouth 2 (two) times a day.    . levothyroxine (SYNTHROID) 200 MCG tablet Take 200 mcg by mouth every morning.    [name] losartan (COZAAR) 50 MG tablet Take 1 tablet (50 mg total) by mouth daily. 30 tablet 0  . Menthol, Topical Analgesic, (BIOFREEZE) 4 % GEL Apply 1 application topically See admin instructions. Apply to back, chest, and abdominal wall twice daily for 10 days    . metoprolol succinate (TOPROL-XL) 50 MG 24 hr tablet Take 1 tablet (50 mg total) by mouth daily. Take with or immediately following a meal. 30 tablet 0  . nicotine (NICODERM CQ - DOSED IN MG/24 HOURS) 14 mg/24hr patch Place 14 mg onto the skin daily.    . Pancrelipase, Lip-Prot-Amyl, 24000-76000 units CPEP Take 1 capsule by mouth 3 (three) times daily.    . pantoprazole (PROTONIX) 40 MG tablet Take 1 tablet (40 mg total) by mouth 2 (two) times daily. 60 tablet 0  . polyethylene glycol  powder (GLYCOLAX/MIRALAX) 17 GM/SCOOP powder Take 17 g by mouth daily.    . multivitamin (RENA-VIT) TABS tablet Take 1 tablet by mouth at bedtime. (Patient not taking: Reported on 04/29/2019) 30 tablet 0  . Nutritional Supplements (FEEDING SUPPLEMENT, NEPRO CARB STEADY,) LIQD Take 237 mLs by mouth 2 (two) times daily between meals. (Patient not taking: Reported on 04/29/2019) 14220 mL 0   Labs: Basic Metabolic Panel: Recent Labs  Lab 04/29/19 1910 04/30/19 0442  NA 133* 136  K 3.4* 3.3*  CL 92* 97*  CO2 25 27  GLUCOSE 173* 95  BUN 8 11  CREATININE 3.93* 4.75*  CALCIUM 7.4* 7.3*   Liver Function Tests: Recent Labs  Lab 04/29/19 1910  AST 21  ALT 11  ALKPHOS 85  BILITOT 1.3*  PROT 5.5*  ALBUMIN 2.5*   CBC: Recent Labs  Lab 04/29/19 1910 04/30/19 0442  WBC 11.3* 8.1  NEUTROABS 9.8*  --   HGB 6.6* 8.5*  HCT 20.3* 25.5*  MCV 100.5* 92.4  PLT 266 243   CBG: Recent Labs  Lab 04/30/19 0120 04/30/19 0438 04/30/19 0759  GLUCAP 108* 87 107*    Dialysis Orders: Center: [facility]  on MWF. 180NRe, Time 4 hours, BFR 400/DFR 800, 3K, 2.25Ca, TDC (AVF maturing), EDW  83.5kg (just lowered) Heparin 2000 unit bolus Mircera 100 mcg IV q 2 weeks- last dose 04/22/19 Calcium acetate 667mg  TID AC   Assessment/Plan: 1.  GI bleed: Recurrent, had recent admit in 03/2019, EGD during that work up revealed gastritis with peptic ulcer, colonoscopy with single non bleeding angiodysplastic lesion and diverticulosis. Hgb 6.6 on admission, received 2 units PRBC > 8.5. On PPI, GI following. 2.  ESRD:  MWF, last HD 04/29/19. K+ 3.3, given 32mEq potassium chloride. Euvolemic on exam, no urgent indication for HD today. Continue MWF schedule, hold heparin. Will use 4K bath.  3.  Hypertension/volume: BP low on admission, home meds held and now moderately elevated, euvolemic on exam. Has been losing weight. Minimal UF with HD as tolerated. 4.  Anemia: See #1. Not due  for ESA yet.   5.  Metabolic bone disease: Corrected Calcium 8.5. Outpatient binder calcium acetate- resume once no longer NPO. 6.  Nutrition:  NPO. Albumin low.  7. T2DM: insulin per primary 8. 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 Plavix on hold, per primary.   Anice Paganini, PA-C 04/30/2019, 10:41 AM  [facility] Pager: [phone]  Nephrology attending:  Patient was seen and examined in ER.  Chart reviewed.  I agree with the consult note including assessment and plan as outlined above.  69 year old male with history of hypertension, stroke, ESRD on HD MWF admitted with GI bleed and anemia.  Received 2 units of blood transfusion.  Last hemodialysis was yesterday.  He is currently on PPI.  GI consult pending.  Plan for next dialysis tomorrow.  Volume status okay.  He has right [facility] for the access.  Katheran James, MD [facility].

## 2019-04-30 NOTE — Progress Notes (Signed)
NEW ADMISSION NOTE New Admission Note:   Arrival Method: stretcher from ED Mental Orientation: axox4 Telemetry:not ordered  Assessment: Completed Skin: see skin assessment  IV: right hand and right AC Pain:denies Tubes: none Safety Measures: Safety Fall Prevention Plan has been discussed  Admission: Completed [facility] Orientation: Patient has been orientated to the room, unit and staff.  Family: none at the bedside   Orders have been reviewed and implemented. Will continue to monitor the patient. Call light has been placed within reach and bed alarm has been activated.   Paulla Fore, RN

## 2019-04-30 NOTE — Plan of Care (Signed)
  Problem: Education: Goal: Knowledge of General Education information will improve Description Including pain rating scale, medication(s)/side effects and non-pharmacologic comfort measures Outcome: Progressing   

## 2019-04-30 NOTE — ED Notes (Signed)
Report called to 5M RN 

## 2019-04-30 NOTE — ED Notes (Signed)
Admitting at bedside 

## 2019-04-30 NOTE — Consult Note (Signed)
Referring Provider: Dr. Darl Householder (ED) Primary Care Physician:  Idelle Crouch, MD Primary Gastroenterologist:  Althia Forts  Reason for Consultation:  Anemia in the setting of melena  HPI: Kyle [name] is a 69 y.o. male with past medical history of  ESRD (on HD), history of stroke January 2021 (on Plavix), type 2 DM (on Insulin), HTN, hypothyroidism, seizure disorder, and GI bleed in February 2021 presenting with anemia in the setting of melena.  The patient is somewhat limited in his communication and cooperation, so the accuracy and completeness of the history obtained from him is correspondingly somewhat limited.  For what it is worth, the patient reports he has been having one loose, melanotic stool each day over the period of days to weeks; patient states is unsure of how long he has had black stools and cannot recall if this has persisted more or less than one month.  He denies any associated abdominal pain or hematochezia.  He further denies dysphagia, heartburn, nausea, vomiting.    Patient did note some upper back pain, which does not appear to be related to GI bleeding and may be of musculoskeletal origin.  He drinks two mixed drinks with liquor daily.  He denies NSAID use.  Hgb on arrival was 6.6 (previously 7.5 on 2/17).  He was given 2u of pRBCs overnight with current Hgb of 8.5.  BUN of 8 and Cr of 3.93 on arrival yesterday; today, BUN 11 with Cr 4.75.   He was hospitalized from 04/04/2019-04/16/2019 for GI bleeding while on aspirin and Plavix.  EGD on 04/05/2019 showed gastritis and mucosal changes in the duodenum.   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  (On review of the photographs of that AVM, it was equivocal in appearance, and not very impressive as a source of significant blood loss.)  He was continued on Plavix alone but discharged on BID pantoprazole.  Patient denies  consistently taking Protonix since discharge.  He is not really able to give a reason why he is not taking it.  The patient is a retired Secondary school teacher for [employer] who lives with his wife.  He has been on dialysis for the past several years.   Past Medical History:  Diagnosis Date  . Anxiety   . BPH (benign prostatic hyperplasia)   . Chronic kidney disease   . Colon polyps   . COPD (chronic obstructive pulmonary disease) ([place])   . Depression   . Diabetes mellitus without complication ([place])   . Diverticulosis of colon   . Hyperlipidemia   . Hypertension   . Hypothyroidism   . Nephrolithiasis   . Seizures ([place])     Past Surgical History:  Procedure Laterality Date  . AV FISTULA PLACEMENT Left 03/30/2019   Procedure: right brachiocephalic fistula creation;  Surgeon: Marty Heck, MD;  Location: [place];  Service: Vascular;  Laterality: Left;  . BIOPSY  04/05/2019   Procedure: BIOPSY;  Surgeon: Wilford Corner, MD;  Location: [place];  Service: Endoscopy;;  . COLONOSCOPY    . COLONOSCOPY WITH PROPOFOL N/A 05/30/2015   Procedure: COLONOSCOPY WITH PROPOFOL;  Surgeon: Manya Silvas, MD;  Location: [facility] ENDOSCOPY;  Service: Endoscopy;  Laterality: N/A;  . COLONOSCOPY WITH PROPOFOL N/A 12/01/2018   Procedure: COLONOSCOPY WITH PROPOFOL;  Surgeon: Toledo, [name], MD;  Location: [facility] ENDOSCOPY;  Service: Gastroenterology;  Laterality: N/A;  . COLONOSCOPY WITH PROPOFOL N/A 04/09/2019  Procedure: COLONOSCOPY WITH PROPOFOL;  Surgeon: Carol Ada, MD;  Location: [place];  Service: Endoscopy;  Laterality: N/A;  . ESOPHAGOGASTRODUODENOSCOPY (EGD) WITH PROPOFOL N/A 04/05/2019   Procedure: ESOPHAGOGASTRODUODENOSCOPY (EGD) WITH PROPOFOL;  Surgeon: Wilford Corner, MD;  Location: [place];  Service: Endoscopy;  Laterality: N/A;  . HOT HEMOSTASIS N/A 04/09/2019   Procedure: HOT HEMOSTASIS (ARGON PLASMA COAGULATION/BICAP);  Surgeon: Carol Ada, MD;  Location: [place];  Service: Endoscopy;  Laterality: N/A;  . INSERTION OF DIALYSIS CATHETER Right 03/30/2019   Procedure: Ultrasound guided right internal jugular tunneled dialysis catheter placement;  Surgeon: Marty Heck, MD;  Location: [facility] OR;  Service: Vascular;  Laterality: Right;  . kidney stone    . THYROIDECTOMY    . VARICOCELE EXCISION    . VIDEO BRONCHOSCOPY Bilateral 05/25/2016   Procedure: VIDEO BRONCHOSCOPY WITHOUT FLUORO;  Surgeon: Juanito Doom, MD;  Location: [facility] ENDOSCOPY;  Service: Cardiopulmonary;  Laterality: Bilateral;    Prior to Admission medications   Medication Sig Start Date End Date Taking? Authorizing Provider  acetaminophen (TYLENOL) 500 MG tablet Take 1,000 mg by mouth daily. 04/21/19 05/05/19 Yes [provider]  amLODipine (NORVASC) 2.5 MG tablet Take 1 tablet (2.5 mg total) by mouth daily. 03/20/19  Yes Wieting, Richard, MD  atorvastatin (LIPITOR) 80 MG tablet Take 1 tablet (80 mg total) by mouth daily. Patient taking differently: Take 80 mg by mouth at bedtime.  03/20/19 04/29/19 Yes Wieting, Richard, MD  B Complex-C-Folic Acid (NEPHRO-VITE PO) Take 1 tablet by mouth daily.   Yes [provider]  calcium acetate (PHOSLO) 667 MG capsule Take 2 capsules (1,334 mg total) by mouth 3 (three) times daily with meals. 03/20/19  Yes Wieting, Richard, MD  clopidogrel (PLAVIX) 75 MG tablet Take 1 tablet (75 mg total) by mouth daily. 04/17/19 05/17/19 Yes Kayleen Memos, DO  ferrous sulfate (FERROUSUL) 325 (65 FE) MG tablet Take 325 mg by mouth daily with breakfast. 04/21/19 05/19/19 Yes [provider]  gentamicin cream (GARAMYCIN) 0.1 % Apply 1 application topically daily. Patient taking differently: Apply 1 application topically See admin instructions. Apply to affected area once daily for infection 03/20/19  Yes Wieting, Richard, MD  insulin lispro (HUMALOG) 100 UNIT/ML KwikPen 2 units subcutaneous injection for sugars 200-250, 3 units for sugars 251-300;  4 units for sugars greater than 301 Patient taking differently: Inject 0-12 Units into the skin See admin instructions. Inject 0-12 units into the skin three times a day before meals, depending on BGL: <70 = CALL MD; 70-200 = give nothing; 201-250 = 2 units; 251-300 = 4 units; 301-350 = 6 units; 351-400 = 8 units; 401-450 = 10 units; 451-600 = 12 units 03/20/19  Yes Wieting, Richard, MD  levETIRAcetam (KEPPRA) 500 MG tablet Take 500 mg by mouth 2 (two) times a day. 09/05/15  Yes [provider]  levothyroxine (SYNTHROID) 200 MCG tablet Take 200 mcg by mouth every morning. 02/25/19  Yes [provider]  losartan (COZAAR) 50 MG tablet Take 1 tablet (50 mg total) by mouth daily. 04/17/19 05/17/19 Yes Hall, Lorenda Cahill, DO  Menthol, Topical Analgesic, (BIOFREEZE) 4 % GEL Apply 1 application topically See admin instructions. Apply to back, chest, and abdominal wall twice daily for 10 days 04/21/19 05/01/19 Yes [provider]  metoprolol succinate (TOPROL-XL) 50 MG 24 hr tablet Take 1 tablet (50 mg total) by mouth daily. Take with or immediately following a meal. 04/17/19 05/17/19 Yes Hall, Carole N, DO  nicotine (NICODERM CQ -  DOSED IN MG/24 HOURS) 14 mg/24hr patch Place 14 mg onto the skin daily.   Yes [provider]  Pancrelipase, Lip-Prot-Amyl, 24000-76000 units CPEP Take 1 capsule by mouth 3 (three) times daily.   Yes [provider]  pantoprazole (PROTONIX) 40 MG tablet Take 1 tablet (40 mg total) by mouth 2 (two) times daily. 04/16/19 05/16/19 Yes Hall, Lorenda Cahill, DO  polyethylene glycol powder (GLYCOLAX/MIRALAX) 17 GM/SCOOP powder Take 17 g by mouth daily.   Yes [provider]  multivitamin (RENA-VIT) TABS tablet Take 1 tablet by mouth at bedtime. Patient not taking: Reported on 04/29/2019 03/20/19   Loletha Grayer, MD  Nutritional Supplements (FEEDING SUPPLEMENT, NEPRO CARB STEADY,) LIQD Take 237 mLs by mouth 2 (two) times daily between meals. Patient not  taking: Reported on 04/29/2019 03/20/19   Loletha Grayer, MD    Current Facility-Administered Medications  Medication Dose Route Frequency Provider Last Rate Last Admin  . acetaminophen (TYLENOL) tablet 650 mg  650 mg Oral Q6H PRN Lenore Cordia, MD       Or  . acetaminophen (TYLENOL) suppository 650 mg  650 mg Rectal Q6H PRN Zada Finders R, MD      . atorvastatin (LIPITOR) tablet 80 mg  80 mg Oral QHS Lenore Cordia, MD   80 mg at 04/30/19 0316  . Chlorhexidine Gluconate Cloth 2 % PADS 6 each  6 each Topical Q0600 Collins, Samantha G, PA-C      . insulin aspart (novoLOG) injection 0-9 Units  0-9 Units Subcutaneous Q4H Zada Finders R, MD      . levETIRAcetam (KEPPRA) tablet 500 mg  500 mg Oral BID Zada Finders R, MD   500 mg at 04/30/19 0934  . levothyroxine (SYNTHROID) tablet 200 mcg  200 mcg Oral QAC breakfast Lenore Cordia, MD   200 mcg at 04/30/19 [phone]  . pantoprazole (PROTONIX) 80 mg in sodium chloride 0.9 % 100 mL (0.8 mg/mL) infusion  8 mg/hr Intravenous Continuous Drenda Freeze, MD 10 mL/hr at 04/30/19 0935 8 mg/hr at 04/30/19 0935   Current Outpatient Medications  Medication Sig Dispense Refill  . acetaminophen (TYLENOL) 500 MG tablet Take 1,000 mg by mouth daily.    [name] amLODipine (NORVASC) 2.5 MG tablet Take 1 tablet (2.5 mg total) by mouth daily.    [name] atorvastatin (LIPITOR) 80 MG tablet Take 1 tablet (80 mg total) by mouth daily. (Patient taking differently: Take 80 mg by mouth at bedtime. ) 30 tablet 0  . B Complex-C-Folic Acid (NEPHRO-VITE PO) Take 1 tablet by mouth daily.    . calcium acetate (PHOSLO) 667 MG capsule Take 2 capsules (1,334 mg total) by mouth 3 (three) times daily with meals. 180 capsule 0  . clopidogrel (PLAVIX) 75 MG tablet Take 1 tablet (75 mg total) by mouth daily. 30 tablet 0  . ferrous sulfate (FERROUSUL) 325 (65 FE) MG tablet Take 325 mg by mouth daily with breakfast.    . gentamicin cream (GARAMYCIN) 0.1 % Apply 1 application topically daily.  (Patient taking differently: Apply 1 application topically See admin instructions. Apply to affected area once daily for infection) 15 g 0  . insulin lispro (HUMALOG) 100 UNIT/ML KwikPen 2 units subcutaneous injection for sugars 200-250, 3 units for sugars 251-300; 4 units for sugars greater than 301 (Patient taking differently: Inject 0-12 Units into the skin See admin instructions. Inject 0-12 units into the skin three times a day before meals, depending on BGL: <70 = CALL MD; 70-200 = give  nothing; 201-250 = 2 units; 251-300 = 4 units; 301-350 = 6 units; 351-400 = 8 units; 401-450 = 10 units; 451-600 = 12 units) 15 mL 11  . levETIRAcetam (KEPPRA) 500 MG tablet Take 500 mg by mouth 2 (two) times a day.    . levothyroxine (SYNTHROID) 200 MCG tablet Take 200 mcg by mouth every morning.    [name] losartan (COZAAR) 50 MG tablet Take 1 tablet (50 mg total) by mouth daily. 30 tablet 0  . Menthol, Topical Analgesic, (BIOFREEZE) 4 % GEL Apply 1 application topically See admin instructions. Apply to back, chest, and abdominal wall twice daily for 10 days    . metoprolol succinate (TOPROL-XL) 50 MG 24 hr tablet Take 1 tablet (50 mg total) by mouth daily. Take with or immediately following a meal. 30 tablet 0  . nicotine (NICODERM CQ - DOSED IN MG/24 HOURS) 14 mg/24hr patch Place 14 mg onto the skin daily.    . Pancrelipase, Lip-Prot-Amyl, 24000-76000 units CPEP Take 1 capsule by mouth 3 (three) times daily.    . pantoprazole (PROTONIX) 40 MG tablet Take 1 tablet (40 mg total) by mouth 2 (two) times daily. 60 tablet 0  . polyethylene glycol powder (GLYCOLAX/MIRALAX) 17 GM/SCOOP powder Take 17 g by mouth daily.    . multivitamin (RENA-VIT) TABS tablet Take 1 tablet by mouth at bedtime. (Patient not taking: Reported on 04/29/2019) 30 tablet 0  . Nutritional Supplements (FEEDING SUPPLEMENT, NEPRO CARB STEADY,) LIQD Take 237 mLs by mouth 2 (two) times daily between meals. (Patient not taking: Reported on 04/29/2019) 14220 mL  0    Allergies as of 04/29/2019  . (No Known Allergies)    Family History  Problem Relation Age of Onset  . Diabetes Mother   . Diabetes Maternal Grandmother   . Diabetes Maternal Grandfather   . Lung cancer Father   . Emphysema Paternal Grandfather     Social History   Socioeconomic History  . Marital status: Married    Spouse name: Not on file  . Number of children: Not on file  . Years of education: Not on file  . Highest education level: Not on file  Occupational History  . Not on file  Tobacco Use  . Smoking status: Light Tobacco Smoker    Packs/day: 1.00    Years: 40.00    Pack years: 40.00    Types: Cigarettes    Last attempt to quit: 05/25/2016    Years since quitting: 2.9  . Smokeless tobacco: Never Used  Substance and Sexual Activity  . Alcohol use: Yes    Alcohol/week: 8.0 standard drinks    Types: 8 Cans of beer per week    Comment: beer or wine   . Drug use: No  . Sexual activity: Not on file  Other Topics Concern  . Not on file  Social History Narrative  . Not on file   Social Determinants of Health   Financial Resource Strain:   . Difficulty of Paying Living Expenses: Not on file  Food Insecurity:   . Worried About Charity fundraiser in the Last Year: Not on file  . Ran Out of Food in the Last Year: Not on file  Transportation Needs:   . Lack of Transportation (Medical): Not on file  . Lack of Transportation (Non-Medical): Not on file  Physical Activity:   . Days of Exercise per Week: Not on file  . Minutes of Exercise per Session: Not on file  Stress:   .  Feeling of Stress : Not on file  Social Connections:   . Frequency of Communication with Friends and Family: Not on file  . Frequency of Social Gatherings with Friends and Family: Not on file  . Attends Religious Services: Not on file  . Active Member of Clubs or Organizations: Not on file  . Attends Archivist Meetings: Not on file  . Marital Status: Not on file   Intimate Partner Violence:   . Fear of Current or Ex-Partner: Not on file  . Emotionally Abused: Not on file  . Physically Abused: Not on file  . Sexually Abused: Not on file    Review of Systems: History was somewhat difficult to elicit, as patient appeared agitated when asked questions and replied in very brief responses.  He denies chest pain or shortness of breath.  See also HPI.  Physical Exam: Vital signs in last 24 hours: Temp:  [97.8 F (36.6 C)-98.6 F (37 C)] 97.9 F (36.6 C) (03/04 0206) Pulse Rate:  [75-87] 78 (03/04 1100) Resp:  [9-29] 9 (03/04 1100) BP: (111-171)/(47-69) 159/65 (03/04 1100) SpO2:  [96 %-100 %] 97 % (03/04 1100) Weight:  [93.9 kg] 93.9 kg (03/03 1904)   General: Sleepy but easily arousable, in NAD Head: Normocephalic and atraumatic. Eyes: Sclera clear, no icterus.  Disconjugate gaze/strabismus. Lungs: Clear throughout to auscultation.   No wheezes, crackles, or rhonchi. No evident respiratory distress. Heart:  Regular rate and rhythm; no murmurs, clicks, rubs,  or gallops. Abdomen: Soft, nontender,and nondistended. No masses noted. Normal bowel sounds, without guarding, or rebound.  Tenckhoff catheter still in place. Rectal:  Deferred; however, ED note reported melanotic stool in rectal vault Pulses: Normal radial pulse is noted. Extremities:  Without clubbing, cyanosis, or edema. Neurologic: Sleepy but arouses to voice alone and coherent Skin: Intact without significant lesions or rashes. Psych:  Mood normal, but not very cooperative.  Not clear if this is situational or his baseline.  Intake/Output from previous day: 03/03 0701 - 03/04 0700 In: 1045 [Blood:945; IV Piggyback:100] Out: -  Intake/Output this shift: Total I/O In: 100 [I.V.:100] Out: -   Lab Results: Recent Labs    04/29/19 1910 04/30/19 0442  WBC 11.3* 8.1  HGB 6.6* 8.5*  HCT 20.3* 25.5*  PLT 266 243   BMET Recent Labs    04/29/19 1910 04/30/19 0442  NA 133* 136   K 3.4* 3.3*  CL 92* 97*  CO2 25 27  GLUCOSE 173* 95  BUN 8 11  CREATININE 3.93* 4.75*  CALCIUM 7.4* 7.3*   LFT Recent Labs    04/29/19 1910  PROT 5.5*  ALBUMIN 2.5*  AST 21  ALT 11  ALKPHOS 85  BILITOT 1.3*   PT/INR Recent Labs    04/29/19 1910  LABPROT 12.5  INR 0.9    Studies/Results: No results found.  Impression: 1. Acute on chronic anemia in the setting of subacute GI blood loss.  Patient has been noncompliant with BID Protonix.  2. Recent stroke, on Plavix  3. CKD on HD  Plan: 1. Continue IV PPI.  Will add sucralfate.  2.  Will hold off on endoscopy at present time.  Suspect bleeding is related to gastritis and duodenal mucosal changes noted on EGD in Feb 2021, coupled with medication nonadherence.  However, a specific discrete source of blood loss has not been identified.  3.  Since his small bowel has not been checked, we will proceed to capsule endoscopy tomorrow morning.  4.  Ultimately,  unless a discrete, correctable source of blood loss is identified, we will have to see how the patient does over time upon resumption of Plavix (and consider stopping it for a good if he has recurrent bleeding episodes, although that would be somewhat risky given his history of CVA.  Perhaps he could be switched to low-dose aspirin which might lead to less blood loss, as long as he was adherent with PPI therapy).  5.  I would consider obtaining case management consultation, to see if they can help with medication adherence, perhaps by engaging the patient's wife.   LOS: 0 days   Churchill  04/30/2019, 11:44 AM   Pager [phone] If no answer or after 5 PM call [phone]

## 2019-05-01 ENCOUNTER — Encounter (HOSPITAL_COMMUNITY)
Admission: EM | Disposition: A | Discharge status: Skilled Nursing Facility | Payer: Self-pay | Source: Skilled Nursing Facility | Attending: Internal Medicine

## 2019-05-01 HISTORY — PX: GIVENS CAPSULE STUDY: SHX5432

## 2019-05-01 LAB — BPAM RBC
Blood Product Expiration Date: 202104022359
Blood Product Expiration Date: 202104022359
ISSUE DATE / TIME: 202103032125
ISSUE DATE / TIME: 202103040007
Unit Type and Rh: 5100
Unit Type and Rh: 5100

## 2019-05-01 LAB — TYPE AND SCREEN
ABO/RH(D): O POS
Antibody Screen: NEGATIVE
Unit division: 0
Unit division: 0

## 2019-05-01 LAB — BASIC METABOLIC PANEL
Anion gap: 16 — ABNORMAL HIGH (ref 5–15)
BUN: 22 mg/dL (ref 8–23)
CO2: 25 mmol/L (ref 22–32)
Calcium: 7.6 mg/dL — ABNORMAL LOW (ref 8.9–10.3)
Chloride: 97 mmol/L — ABNORMAL LOW (ref 98–111)
Creatinine, Ser: 7.04 mg/dL — ABNORMAL HIGH (ref 0.61–1.24)
GFR calc Af Amer: 8 mL/min — ABNORMAL LOW (ref >60)
GFR calc non Af Amer: 7 mL/min — ABNORMAL LOW (ref >60)
Glucose, Bld: 84 mg/dL (ref 70–99)
Potassium: 3.6 mmol/L (ref 3.5–5.1)
Sodium: 138 mmol/L (ref 135–145)

## 2019-05-01 LAB — CBC
HCT: 27.1 % — ABNORMAL LOW (ref 39.0–52.0)
HCT: 28.6 % — ABNORMAL LOW (ref 39.0–52.0)
Hemoglobin: 9 g/dL — ABNORMAL LOW (ref 13.0–17.0)
Hemoglobin: 9.4 g/dL — ABNORMAL LOW (ref 13.0–17.0)
MCH: 30.6 pg (ref 26.0–34.0)
MCH: 30.7 pg (ref 26.0–34.0)
MCHC: 33.2 g/dL (ref 30.0–36.0)
MCHC: 32.9 g/dL (ref 30.0–36.0)
MCV: 92.2 fL (ref 80.0–100.0)
MCV: 93.5 fL (ref 80.0–100.0)
Platelets: 240 10*3/uL (ref 150–400)
Platelets: 255 10*3/uL (ref 150–400)
RBC: 2.94 MIL/uL — ABNORMAL LOW (ref 4.22–5.81)
RBC: 3.06 MIL/uL — ABNORMAL LOW (ref 4.22–5.81)
RDW: 17.6 % — ABNORMAL HIGH (ref 11.5–15.5)
RDW: 17.6 % — ABNORMAL HIGH (ref 11.5–15.5)
WBC: 6.6 10*3/uL (ref 4.0–10.5)
WBC: 7.4 10*3/uL (ref 4.0–10.5)
nRBC: 0 % (ref 0.0–0.2)
nRBC: 0 % (ref 0.0–0.2)

## 2019-05-01 LAB — HEPATITIS B SURFACE ANTIGEN: Hepatitis B Surface Ag: NONREACTIVE

## 2019-05-01 LAB — GLUCOSE, CAPILLARY
Glucose-Capillary: 199 mg/dL — ABNORMAL HIGH (ref 70–99)
Glucose-Capillary: 83 mg/dL (ref 70–99)
Glucose-Capillary: 86 mg/dL (ref 70–99)

## 2019-05-01 SURGERY — IMAGING PROCEDURE, GI TRACT, INTRALUMINAL, VIA CAPSULE
Anesthesia: LOCAL

## 2019-05-01 SURGICAL SUPPLY — 1 items: TOWEL COTTON PACK 4EA (MISCELLANEOUS) ×4 IMPLANT

## 2019-05-01 NOTE — Progress Notes (Signed)
Pillcam ingested at 0830,. Pt tolerated well. Instructions given to patient and bedside RN.

## 2019-05-01 NOTE — Plan of Care (Signed)
  Problem: Activity: Goal: Risk for activity intolerance will decrease Outcome: Not Progressing   

## 2019-05-01 NOTE — Progress Notes (Addendum)
[name] Progress Note   Subjective:   Seen and examined at bedside.  Feeling ok today.  Ingested pillcam this AM.  Denies n/v/d, abdominal pain, SOB, CP, dizziness and fatigue.    Objective Vitals:   04/30/19 2127 05/01/19 0500 05/01/19 0815 05/01/19 0915  BP: (!) 175/71 (!) 160/53  138/61  Pulse: 75 79  81  Resp: 16 16  18   Temp: 98.4 F (36.9 C) 98.6 F (37 C)  99 F (37.2 C)  TempSrc: Oral Oral  Oral  SpO2: 97% 98%  98%  Weight:   82.9 kg   Height:   6' (1.829 m)    Physical Exam General:WDWN, chronically ill appearing male, in NAD Heart:RRR, no mrg Lungs:CTAB Abdomen:soft, NTND Extremities:no LE edema Dialysis Access: LU AVF maturing +b, [facility]   Filed Weights   04/29/19 1904 04/30/19 2100 05/01/19 0815  Weight: 93.9 kg 82.9 kg 82.9 kg    Intake/Output Summary (Last 24 hours) at 05/01/2019 1218 Last data filed at 05/01/2019 0900 Gross per 24 hour  Intake 240 ml  Output 0 ml  Net 240 ml    Additional Objective Labs: Basic Metabolic Panel: Recent Labs  Lab 04/29/19 1910 04/30/19 0442 05/01/19 0726  NA 133* 136 138  K 3.4* 3.3* 3.6  CL 92* 97* 97*  CO2 25 27 25   GLUCOSE 173* 95 84  BUN 8 11 22   CREATININE 3.93* 4.75* 7.04*  CALCIUM 7.4* 7.3* 7.6*   Liver Function Tests: Recent Labs  Lab 04/29/19 1910  AST 21  ALT 11  ALKPHOS 85  BILITOT 1.3*  PROT 5.5*  ALBUMIN 2.5*   CBC: Recent Labs  Lab 04/29/19 1910 04/30/19 0442 05/01/19 0726  WBC 11.3* 8.1 6.6  NEUTROABS 9.8*  --   --   HGB 6.6* 8.5* 9.0*  HCT 20.3* 25.5* 27.1*  MCV 100.5* 92.4 92.2  PLT 266 243 240   CBG: Recent Labs  Lab 04/30/19 1234 04/30/19 1713 04/30/19 2132 05/01/19 0520 05/01/19 1125  GLUCAP 106* 111* 98 83 86    Medications: . pantoprozole (PROTONIX) infusion 8 mg/hr (05/01/19 0414)   . amLODipine  2.5 mg Oral Daily  . atorvastatin  80 mg Oral QHS  . Chlorhexidine Gluconate Cloth  6 each Topical Q0600  . insulin aspart  0-9 Units  Subcutaneous Q4H  . levETIRAcetam  500 mg Oral BID  . levothyroxine  200 mcg Oral QAC breakfast  . metoprolol succinate  50 mg Oral Daily    Dialysis Orders: [facility]  on MWF. 180NRe, Time 4 hours, BFR 400/DFR 800, 3K, 2.25Ca, TDC (AVF maturing), EDW  83.5kg (just lowered) Heparin 2000 unit bolus Mircera 100 mcg IV q 2 weeks- last dose 04/22/19 Calcium acetate 667mg  TID AC   Assessment/Plan: 1.  GI bleed: Recurrent, had recent admit in 03/2019, EGD during that work up revealed gastritis with peptic ulcer, colonoscopy with single non bleeding angiodysplastic lesion and diverticulosis. Hgb 6.6 on admission, received 2 units PRBC > 8.5, up to 9.0 today. Getting capsule study. On PPI, GI following. 2.  ESRD:  on MWF. K+ 3.6, will use 4K bath. Plan for HD today per regular schedule.  No heparin.  3.  Hypertension/volume: BP improved today, home meds resumed.  Appears euvolemic on exam, has been losing weight.  May need lower dry on d/c.  Titrate down volume as tolerated.  4.  Anemia: See #1. ESA due 8/01.  5.  Metabolic bone disease: Corrected Calcium 8.8. Outpatient binder  calcium acetate- resume once no longer NPO. 6.  Nutrition:  NPO. Albumin low. Renal diet w/fluid restrictions once advanced.  7. T2DM: insulin per primary 8. 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 Plavix on hold, per primary.    Jen Mow, PA-C [facility] Pager: [phone] 05/01/2019,12:18 PM  LOS: 1 day   Nephrology attending: Patient was seen and examined at bedside.  Chart reviewed.  I agree with assessment and plan as outlined above.  ESRD on HD admitted for GI bleed.  Plan for capsule endoscopy today noticed.  Clinically stable.  HD today.  Katheran James, MD [facility].

## 2019-05-01 NOTE — Plan of Care (Signed)
  Problem: Clinical Measurements: Goal: Ability to maintain clinical measurements within normal limits will improve Outcome: Progressing   

## 2019-05-01 NOTE — Progress Notes (Signed)
Hemoglobin this morning stable from yesterday.  Capsule endoscopy study currently in progress.  Hopefully, we will have the reading from it either late Saturday or sometime on Sunday.  Cleotis Nipper, M.D. Pager [phone] If no answer or after 5 PM call [phone]

## 2019-05-01 NOTE — Progress Notes (Signed)
OT Cancellation Note  Patient Details Name: [name] MRN: [id number] DOB: 1951/01/29   Cancelled Treatment:    Reason Eval/Treat Not Completed: Patient at procedure or test/ unavailable(HD)  Malka So 05/01/2019, 3:18 PM  Nestor Lewandowsky, OTR/L Acute Rehabilitation Services Pager: [phone] Office: [phone]

## 2019-05-01 NOTE — Evaluation (Signed)
Physical Therapy Evaluation Patient Details Name: Kyle [name] MRN: [id number] DOB: 1950/09/24 Today's Date: 05/01/2019   History of Present Illness  Patient is a 69 y/o male who presents from [facility] for evaluation of low hemoglobin. Recently hospitalized from 04/04/2019-04/16/2019 for GI bleed. Admitted with Acute on chronic blood loss anemia. Currently undergoing capsule endoscopy 3/5. PMH includes seizures, HTN, HLD, DM, CVA, depression, COPD, CKD, anxiety.  Clinical Impression  Patient presents with generalized weakness, impaired balance, fatigue, decreased activity tolerance and impaired mobility s/p above. Pt from [facility] and reports using RW for ambulation. Today, pt requires Min A to stand from EOB with use of RW and min-Max A to take a few steps along side bed with complete LOB towards right with therapist lowering pt down onto bed. Reports dizziness and feeling tired/fatigued. Would benefit from return to SNF [facility]) to maximize independence and mobility prior to return home. Will follow acutely.    Follow Up Recommendations SNF(return to [facility])    Equipment Recommendations  Other (comment)(defer to post acute)    Recommendations for Other Services       Precautions / Restrictions Precautions Precautions: Fall Restrictions Weight Bearing Restrictions: No      Mobility  Bed Mobility Overal bed mobility: Needs Assistance Bed Mobility: Sit to Supine;Rolling;Sidelying to Sit Rolling: Min guard Sidelying to sit: Mod assist;HOB elevated   Sit to supine: Min guard;HOB elevated   General bed mobility comments: Cues to reach for rail; assist with trunk to get to EOB; needing UE support. Able to return self to supine.  Transfers Overall transfer level: Needs assistance Equipment used: Rolling walker (2 wheeled) Transfers: Sit to/from Stand Sit to Stand: Min assist         General transfer comment: Min A to power to standing with cues for hand  placement; performed x2 from EOB. Right lateral lean.  Ambulation/Gait Ambulation/Gait assistance: Max assist;Min assist Gait Distance (Feet): 3 Feet Assistive device: Rolling walker (2 wheeled) Gait Pattern/deviations: Shuffle Gait velocity: decreased   General Gait Details: Able to take a few steps along side bed with Min A, pt with complete LOB towards right with therapist catching pt, lowered onto bed. Performed marching in standing with LOB posteriorly.  Stairs            Wheelchair Mobility    Modified Rankin (Stroke Patients Only) Modified Rankin (Stroke Patients Only) Pre-Morbid Rankin Score: Moderately severe disability Modified Rankin: Moderately severe disability     Balance Overall balance assessment: Needs assistance Sitting-balance support: Feet supported;Single extremity supported Sitting balance-Leahy Scale: Fair Sitting balance - Comments: close Min guard, does better with UE support.   Standing balance support: During functional activity Standing balance-Leahy Scale: Poor Standing balance comment: Requires UE support and external support for dynamic tasks. Pt with complete LOB towards right.                             Pertinent Vitals/Pain Pain Assessment: Faces Faces Pain Scale: Hurts even more Pain Location: "everywhere, back" chronic Pain Descriptors / Indicators: Aching;Discomfort;Moaning Pain Intervention(s): Repositioned;Monitored during session;Limited activity within patient's tolerance    Home Living Family/patient expects to be discharged to:: Skilled nursing facility([facility]) Living Arrangements: Spouse/significant other Available Help at Discharge: Family;Available 24 hours/day Type of Home: House Home Access: Stairs to enter Entrance Stairs-Rails: Right Entrance Stairs-Number of Steps: 6 Home Layout: Two level Home Equipment: Walker - 2 wheels;Cane - single point;Walker -  standard      Prior Function Level of  Independence: Needs assistance   Gait / Transfers Assistance Needed: Reports walking with RW at [facility] P[facility]  ADL's / Homemaking Assistance Needed: Nurse assisted with ADLs.  Comments: Was at [facility] prior to admission.     Hand Dominance   Dominant Hand: Right    Extremity/Trunk Assessment   Upper Extremity Assessment Upper Extremity Assessment: Defer to OT evaluation    Lower Extremity Assessment Lower Extremity Assessment: RLE deficits/detail RLE Deficits / Details: grossly 4/5, decreased coordination. RLE Coordination: decreased fine motor LLE Deficits / Details: functional       Communication   Communication: No difficulties  Cognition Arousal/Alertness: Awake/alert Behavior During Therapy: WFL for tasks assessed/performed Overall Cognitive Status: No family/caregiver present to determine baseline cognitive functioning                                 General Comments: Poor insight into functional deficits. Appears confused with timeline of last 2 months. A&Ox4. Easily irritated.      General Comments General comments (skin integrity, edema, etc.): VSS.    Exercises     Assessment/Plan    PT Assessment Patient needs continued PT services  PT Problem List Decreased strength;Decreased activity tolerance;Decreased balance;Decreased mobility;Decreased coordination;Decreased knowledge of use of DME;Decreased safety awareness;Pain       PT Treatment Interventions Gait training;Functional mobility training;Therapeutic activities;Neuromuscular re-education;Balance training;Patient/family education;DME instruction;Therapeutic exercise    PT Goals (Current goals can be found in the Care Plan section)  Acute Rehab PT Goals Patient Stated Goal: to do some rehab so i can get stronger and find out about this bleeding PT Goal Formulation: With patient Time For Goal Achievement: 05/15/19 Potential to Achieve Goals: Good    Frequency Min 2X/week    Barriers to discharge        Co-evaluation               AM-PAC PT "6 Clicks" Mobility  Outcome Measure Help needed turning from your back to your side while in a flat bed without using bedrails?: None Help needed moving from lying on your back to sitting on the side of a flat bed without using bedrails?: A Lot Help needed moving to and from a bed to a chair (including a wheelchair)?: A Lot Help needed standing up from a chair using your arms (e.g., wheelchair or bedside chair)?: A Lot Help needed to walk in hospital room?: A Lot Help needed climbing 3-5 steps with a railing? : Total 6 Click Score: 13    End of Session Equipment Utilized During Treatment: Gait belt Activity Tolerance: Patient limited by fatigue Patient left: in bed;with call bell/phone within reach;with bed alarm set Nurse Communication: Mobility status PT Visit Diagnosis: Unsteadiness on feet (R26.81);Other abnormalities of gait and mobility (R26.89);Muscle weakness (generalized) (M62.81)    Time: 1350-1410 PT Time Calculation (min) (ACUTE ONLY): 20 min   Charges:   PT Evaluation $PT Eval Moderate Complexity: 1 Mod          Marisa Severin, PT, DPT Acute Rehabilitation Services Pager [phone] Office [phone]    .  Marguarite Arbour A Breiana Stratmann 05/01/2019, 2:45 PM

## 2019-05-01 NOTE — Progress Notes (Signed)
PROGRESS NOTE    Snyder Colavito  YIF:[id number] DOB: [birth year] DOA: 04/29/2019 PCP: Idelle Crouch, MD   Brief Narrative:  HPI: Kyle [name] is a 69 y.o. 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  Patient reports he has been seeing dark black-colored stool for several days now.  He has been feeling cold but otherwise denies any obvious bleeding including epistaxis, hemoptysis, hematemesis, hematuria, or bright red blood per rectum.  He denies any chest pain or dyspnea.  He was recently hospitalized from 04/04/2019-04/16/2019 for GI bleeding.  At time of that admission he was on both aspirin and Plavix for his recent stroke.  He had an upper endoscopy on 04/05/2019 which showed gastritis and mucosal changes in the duodenum.  He underwent colonoscopy on 04/09/2019 which showed a single nonbleeding colonic angiodysplastic lesion which was treated with a monopolar probe.  Diverticulosis in the entire examined colon also noted.  On discharge he was taken off aspirin and continued on Plavix alone as well as pantoprazole.  ED Course:  Initial vitals showed BP 111/62, pulse 82, RR 18, temp 97.8 Fahrenheit, SPO2 97% on room air.  Labs notable for hemoglobin 6.6, WBC 11.3, platelets 266,000, sodium 133, potassium 3.4, bicarb 25, BUN 8, creatinine 3.93, serum glucose 173, PT 12.5, INR 0.9, FOBT positive.  EDP discussed with on-call GI who will see in consultation.  Patient was started on IV Protonix infusion.  EDP also discussed with on-call nephrology.  Patient was ordered to receive 2 unit PRBC transfusion.  The hospitalist service was  consulted admit for further evaluation management.  Assessment & Plan:   Principal Problem:   Acute on chronic blood loss anemia Active Problems:   Chronic obstructive pulmonary disease (HCC)   Hyperlipidemia associated with type 2 diabetes mellitus (HCC)   Seizure disorder (HCC)   Insulin dependent type 2 diabetes mellitus (HCC)   ESRD (end stage renal disease) ([place])   Hypertension associated with diabetes ([place])   Hypothyroidism   History of CVA (cerebrovascular accident)   Acute blood loss anemia   Acute on chronic blood loss anemia/GI bleeding, most likely upper: EGD 04/05/2019 showed gastritis with duodenal peptic ulcer injury.  Colonoscopy 04/09/2019 showed single nonbleeding colonic angiodysplastic lesion treated with monopolar probe and diverticulosis throughout the examined colon.  Patient returns with melena, hemoglobin 6.6 on admission.  Received 2 units of PRBC transfusion and started on Protonix.  Posttransfusion hemoglobin 8.5>9.0. GI saw him.  No plans of EGD or colonoscopy.  He ingested PillCam today.  Continue PPI.  Appreciate GI help.  Hold Plavix.  History of left midbrain CVA in January 2021: With residual right-sided weakness, left-sided ptosis with 3rd cranial nerve palsy.  Previously on both aspirin and Plavix but aspirin was discontinued on last admission due to GI bleeding and he was resumed on Plavix alone.  Continue to hold Plavix.  ESRD on MWF HD: Completed HD prior to admission.  Nephrology on board.  Appreciate their help.  Insulin-dependent type 2 diabetes: Blood sugar fairly controlled.  Continue sensitive SSI q4h while NPO.  Essential hypertension: Blood pressure slightly elevated.  Continue Toprol-XL and amlodipine but hold losartan.  Hypothyroidism: Continue Synthroid.  Seizure disorder: Continue  Keppra.   DVT prophylaxis: SCD, avoiding heparin products due to GI bleed   Code Status: Full Code  Family Communication:  None present at bedside.   Plan of care discussed with patient in length and he verbalized understanding and agreed with it. Patient is from: Home Disposition Plan: Likely home Barriers to discharge: Active GI bleeding needing further GI work-up which is in process.  Need to monitor H&H closely.  Need IV Protonix.   Estimated body mass index is 25.71 kg/m as calculated from the following:   Height as of this encounter: 6' (1.829 m).   Weight as of this encounter: 86 kg.      Nutritional status:               Consultants:   GI  Procedures:   None  Antimicrobials:   None   Subjective: Seen and examined early this morning.  No complaints.  He denied any further rectal bleeding.  Objective: Vitals:   05/01/19 1507 05/01/19 1508 05/01/19 1530 05/01/19 1600  BP: (!) 156/69 (!) 165/73 (!) 144/63 (!) 142/66  Pulse: 81 80 86 81  Resp: 18 18  18   Temp:      TempSrc:      SpO2:      Weight:      Height:        Intake/Output Summary (Last 24 hours) at 05/01/2019 1610 Last data filed at 05/01/2019 1500 Gross per 24 hour  Intake 369.38 ml  Output 0 ml  Net 369.38 ml   Filed Weights   04/30/19 2100 05/01/19 0815 05/01/19 1500  Weight: 82.9 kg 82.9 kg 86 kg    Examination:  General exam: Appears calm and comfortable  Respiratory system: Clear to auscultation. Respiratory effort normal. Cardiovascular system: S1 & S2 heard, RRR. No JVD, murmurs, rubs, gallops or clicks. No pedal edema. Gastrointestinal system: Abdomen is nondistended, soft and nontender. No organomegaly or masses felt. Normal bowel sounds heard. Central nervous system: Alert and oriented. No focal neurological deficits. Extremities: Symmetric 5 x 5 power. Skin: No rashes, lesions or ulcers.  Psychiatry: Judgement and insight appear normal. Mood & affect appropriate.    Data Reviewed: I have personally reviewed following labs and imaging studies  CBC: Recent Labs  Lab 04/29/19 1910 04/30/19 0442 05/01/19 0726    WBC 11.3* 8.1 6.6  NEUTROABS 9.8*  --   --   HGB 6.6* 8.5* 9.0*  HCT 20.3* 25.5* 27.1*  MCV 100.5* 92.4 92.2  PLT 266 243 277   Basic Metabolic Panel: Recent Labs  Lab 04/29/19 1910 04/30/19 0442 05/01/19 0726  NA 133* 136 138  K 3.4* 3.3* 3.6  CL 92* 97* 97*  CO2 25 27 25   GLUCOSE 173* 95 84  BUN 8 11 22   CREATININE 3.93* 4.75* 7.04*  CALCIUM 7.4* 7.3* 7.6*   GFR: Estimated Creatinine Clearance: 10.7 mL/min (A) (by C-G formula based on SCr of 7.04 mg/dL (H)). Liver Function Tests: Recent Labs  Lab 04/29/19 1910  AST 21  ALT 11  ALKPHOS 85  BILITOT 1.3*  PROT 5.5*  ALBUMIN 2.5*   No results for input(s): LIPASE, AMYLASE in the last 168 hours. No results for input(s): AMMONIA in the last 168 hours. Coagulation Profile: Recent Labs  Lab 04/29/19 1910  INR 0.9   Cardiac Enzymes: No results for input(s): CKTOTAL, CKMB, CKMBINDEX, TROPONINI in the last 168 hours. BNP (last 3 results) No results for input(s): PROBNP in the last 8760 hours. HbA1C: No results  for input(s): HGBA1C in the last 72 hours. CBG: Recent Labs  Lab 04/30/19 1234 04/30/19 1713 04/30/19 2132 05/01/19 0520 05/01/19 1125  GLUCAP 106* 111* 98 83 86   Lipid Profile: No results for input(s): CHOL, HDL, LDLCALC, TRIG, CHOLHDL, LDLDIRECT in the last 72 hours. Thyroid Function Tests: No results for input(s): TSH, T4TOTAL, FREET4, T3FREE, THYROIDAB in the last 72 hours. Anemia Panel: No results for input(s): VITAMINB12, FOLATE, FERRITIN, TIBC, IRON, RETICCTPCT in the last 72 hours. Sepsis Labs: No results for input(s): PROCALCITON, LATICACIDVEN in the last 168 hours.  Recent Results (from the past 240 hour(s))  SARS CORONAVIRUS 2 (TAT 6-24 HRS) Nasopharyngeal Nasopharyngeal Swab     Status: None   Collection Time: 04/30/19 11:48 AM   Specimen: Nasopharyngeal Swab  Result Value Ref Range Status   SARS Coronavirus 2 NEGATIVE NEGATIVE Final    Comment: (NOTE) SARS-CoV-2 target nucleic  acids are NOT DETECTED. The SARS-CoV-2 RNA is generally detectable in upper and lower respiratory specimens during the acute phase of infection. Negative results do not preclude SARS-CoV-2 infection, do not rule out co-infections with other pathogens, and should not be used as the sole basis for treatment or other patient management decisions. Negative results must be combined with clinical observations, patient history, and epidemiological information. The expected result is Negative. Fact Sheet for Patients: SugarRoll.be Fact Sheet for Healthcare Providers: https://www.woods-mathews.com/ This test is not yet approved or cleared by the [place] FDA and  has been authorized for detection and/or diagnosis of SARS-CoV-2 by FDA under an Emergency Use Authorization (EUA). This EUA will remain  in effect (meaning this test can be used) for the duration of the COVID-19 declaration under Section 56 4(b)(1) of the Act, 21 U.S.C. section 360bbb-3(b)(1), unless the authorization is terminated or revoked sooner. Performed at [facility], [place] [address] [place],  [address]       Radiology Studies: No results found.  Scheduled Meds: . amLODipine  2.5 mg Oral Daily  . atorvastatin  80 mg Oral QHS  . Chlorhexidine Gluconate Cloth  6 each Topical Q0600  . insulin aspart  0-9 Units Subcutaneous Q4H  . levETIRAcetam  500 mg Oral BID  . levothyroxine  200 mcg Oral QAC breakfast  . metoprolol succinate  50 mg Oral Daily   Continuous Infusions: . pantoprozole (PROTONIX) infusion 8 mg/hr (05/01/19 1423)     LOS: 1 day   Time spent: 29 minutes   Darliss Cheney, MD [facility]  05/01/2019, 4:10 PM   To contact the attending provider between 7A-7P or the covering provider during after hours 7P-7A, please log into the web site [email or web address].

## 2019-05-02 ENCOUNTER — Inpatient Hospital Stay (HOSPITAL_COMMUNITY): Payer: Medicare PPO

## 2019-05-02 DIAGNOSIS — Z8673 Personal history of transient ischemic attack (TIA), and cerebral infarction without residual deficits: Secondary | ICD-10-CM

## 2019-05-02 DIAGNOSIS — E1159 Type 2 diabetes mellitus with other circulatory complications: Secondary | ICD-10-CM

## 2019-05-02 DIAGNOSIS — I951 Orthostatic hypotension: Secondary | ICD-10-CM | POA: Diagnosis present

## 2019-05-02 DIAGNOSIS — Z992 Dependence on renal dialysis: Secondary | ICD-10-CM

## 2019-05-02 DIAGNOSIS — G40909 Epilepsy, unspecified, not intractable, without status epilepticus: Secondary | ICD-10-CM

## 2019-05-02 DIAGNOSIS — E039 Hypothyroidism, unspecified: Secondary | ICD-10-CM

## 2019-05-02 DIAGNOSIS — Z794 Long term (current) use of insulin: Secondary | ICD-10-CM

## 2019-05-02 DIAGNOSIS — I10 Essential (primary) hypertension: Secondary | ICD-10-CM

## 2019-05-02 DIAGNOSIS — N186 End stage renal disease: Secondary | ICD-10-CM

## 2019-05-02 DIAGNOSIS — E119 Type 2 diabetes mellitus without complications: Secondary | ICD-10-CM

## 2019-05-02 LAB — BASIC METABOLIC PANEL
Anion gap: 13 (ref 5–15)
BUN: 12 mg/dL (ref 8–23)
CO2: 24 mmol/L (ref 22–32)
Calcium: 7.6 mg/dL — ABNORMAL LOW (ref 8.9–10.3)
Chloride: 100 mmol/L (ref 98–111)
Creatinine, Ser: 4.26 mg/dL — ABNORMAL HIGH (ref 0.61–1.24)
GFR calc Af Amer: 15 mL/min — ABNORMAL LOW (ref >60)
GFR calc non Af Amer: 13 mL/min — ABNORMAL LOW (ref >60)
Glucose, Bld: 101 mg/dL — ABNORMAL HIGH (ref 70–99)
Potassium: 3.8 mmol/L (ref 3.5–5.1)
Sodium: 137 mmol/L (ref 135–145)

## 2019-05-02 LAB — CBC
HCT: 27 % — ABNORMAL LOW (ref 39.0–52.0)
Hemoglobin: 8.9 g/dL — ABNORMAL LOW (ref 13.0–17.0)
MCH: 30.7 pg (ref 26.0–34.0)
MCHC: 33 g/dL (ref 30.0–36.0)
MCV: 93.1 fL (ref 80.0–100.0)
Platelets: 235 10*3/uL (ref 150–400)
RBC: 2.9 MIL/uL — ABNORMAL LOW (ref 4.22–5.81)
RDW: 17.4 % — ABNORMAL HIGH (ref 11.5–15.5)
WBC: 6.4 10*3/uL (ref 4.0–10.5)
nRBC: 0 % (ref 0.0–0.2)

## 2019-05-02 LAB — HEMOGLOBIN AND HEMATOCRIT, BLOOD
HCT: 26.1 % — ABNORMAL LOW (ref 39.0–52.0)
Hemoglobin: 8.7 g/dL — ABNORMAL LOW (ref 13.0–17.0)

## 2019-05-02 LAB — GLUCOSE, CAPILLARY
Glucose-Capillary: 98 mg/dL (ref 70–99)
Glucose-Capillary: 100 mg/dL — ABNORMAL HIGH (ref 70–99)
Glucose-Capillary: 170 mg/dL — ABNORMAL HIGH (ref 70–99)
Glucose-Capillary: 248 mg/dL — ABNORMAL HIGH (ref 70–99)
Glucose-Capillary: 248 mg/dL — ABNORMAL HIGH (ref 70–99)

## 2019-05-02 LAB — HEPATITIS B CORE ANTIBODY, TOTAL: Hep B Core Total Ab: NONREACTIVE

## 2019-05-02 MED ORDER — CALCIUM ACETATE (PHOS BINDER) 667 MG PO CAPS
667.0000 mg | ORAL_CAPSULE | Freq: Three times a day (TID) | ORAL | Status: DC
  Administered 2019-05-02 – 2019-05-03 (×3): 667 mg via ORAL
  Filled 2019-05-02 (×3): qty 1

## 2019-05-02 MED ORDER — MIDODRINE HCL 5 MG PO TABS
10.0000 mg | ORAL_TABLET | Freq: Once | ORAL | Status: AC
  Administered 2019-05-02: 10 mg via ORAL
  Filled 2019-05-02: qty 2

## 2019-05-02 MED ORDER — SODIUM CHLORIDE 0.9 % IV BOLUS
250.0000 mL | Freq: Once | INTRAVENOUS | Status: AC
  Administered 2019-05-02: 250 mL via INTRAVENOUS

## 2019-05-02 NOTE — Progress Notes (Signed)
OT EVALUATION: PATIENT WAS SEEN TO ASSESS FOR OT  NEEDS. PATIENT HAS DECREASED ABILITY TO CARE FOR HIMSELF AND HE NEEDS FURTHER OT IN ACUTE. RECOMMEND THAT PATIENT D/C BACK TO SNF FOR FURTHER REHABILATION. PATIENT HAS DECREASED SAFETY AND INSIGHT INTO LIMITATION AND HAS A HIGH FALL RISK. PATIENT BECOMES ORTHOSTATIC IN STANDING AND DOES BECOME DIZZY. PATIENT MD AND NURSE ARE AWARE. ACUTE OT TO FOLLOW.   05/02/19 1000  OT Visit Information  Last OT Received On 05/02/19  History of Present Illness Kyle [name] is a 69 y.o. 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), insulin-dependent type 2 diabetes, hypertension, hyperlipidemia, hypothyroidism, seizure disorder, and recent admission for GI bleed who presents  er for low hemogloblin  Precautions  Precautions Fall  Restrictions  Weight Bearing Restrictions No  Home Living  Family/patient expects to be discharged to: [place] other  Available Help at Discharge Family;Available 24 hours/day  Type of Home House  Home Access Stairs to enter  Entrance Stairs-Number of Steps 6  Entrance Stairs-Rails Right  Home Layout Two level  Bathroom Shower/Tub Walk-in shower  Bathroom Toilet Handicapped height  Bathroom Accessibility Yes  How Accessible Accessible via walker  [place] - 2 wheels;Cane - single point;BSC;Shower seat (has bed that is able to elevate the head )   Lives With Spouse  Prior Function  Level of Independence Needs assistance (prior to cva in january patinet was I level. )  Gait / Transfers Assistance Needed needed assist since cva  ADL's / Homemaking Assistance Needed needs assist since cva  Comments was at [facility] for rehab.   Communication  Communication No difficulties  Pain Assessment  Pain Score 0   Cognition  Arousal/Alertness Awake/alert  Behavior During Therapy WFL for tasks assessed/performed  Overall Cognitive Status No family/caregiver present to determine baseline cognitive functioning  Safety/Judgement Decreased awareness of safety  General Comments poor insight. Patient was not clear on medical history of last 2 months.   Upper Extremity Assessment  Upper Extremity Assessment RUE deficits/detail;LUE deficits/detail (ROM WNL and strength 4/5 in B UE. B slowed coordination )  Lower Extremity Assessment  Lower Extremity Assessment Defer to PT evaluation  Cervical / Trunk Assessment  Cervical / Trunk Assessment  (Pt. leans R in sitting and standing)  ADL  Overall ADL's  Needs assistance/impaired  Grooming Wash/dry hands;Wash/dry face;Brushing hair;Sitting;Set up (unable to stand in grooming secondary to low blood pressure.)  Upper Body Bathing Sitting;Minimal assistance (Pt. needs cues to increase coverage area. )  Lower Body Bathing Maximal assistance;Sit to/from stand (Pt. needs cues for increased coverage area. )  Upper Body Dressing  Moderate assistance;Sitting  Lower Body Dressing Moderate assistance;Sit to/from Retail [employer] Minimal assistance;Ambulation  Functional mobility during ADLs Minimal assistance (Pt. requires cues for proper hand placement and safety.)  General ADL Comments Pt. leans to the left in sitting and standing that impacts ADLs. Pt. has decreased cogntion and requires multiple cues to perform ADL tasks. Pt. is easily distractable and impulsive.   Vision- History  Baseline Vision/History  (Pt. with cva in January with 3rd cranial nerve pytosis. )  Vision- Assessment  Additional Comments Pt. with decreased tracking in L eye and is not able to go past midline medially. Pt. appears to have decreased depth perception during ADLs.   Bed Mobility  Supine  to sit Min assist  General bed mobility comments Pt. required cues for proper technique.    Transfers  Sit to Stand Min assist  General transfer comment Pt. requires cues for proper hand placement.   Balance  Sitting balance-Leahy Scale Fair  Sitting balance - Comments Pt. leans to R. Pt. orthostatic during standing.   OT - End of Session  Equipment Utilized During Treatment Gait belt;Rolling walker  Activity Tolerance Other (comment) (orthostatic during standing)  Patient left in chair;with call bell/phone within reach;with bed alarm set  Nurse Communication  (discussed patient transfer status and  orthostatic bp)  OT Assessment  OT Recommendation/Assessment Patient needs continued OT Services  OT Visit Diagnosis Unsteadiness on feet (R26.81);Dizziness and giddiness (R42)  OT Problem List Decreased activity tolerance;Impaired balance (sitting and/or standing);Impaired vision/perception;Decreased safety awareness;Decreased knowledge of use of DME or AE  Barriers to Discharge  (plan to d/c to SNF)  OT Plan  OT Frequency (ACUTE ONLY) Min 2X/week  OT Treatment/Interventions (ACUTE ONLY) Self-care/ADL training;Therapeutic activities;Visual/perceptual remediation/compensation;Patient/family education  AM-PAC OT "6 Clicks" Daily Activity Outcome Measure (Version 2)  Help from another person eating meals? 3  Help from another person taking care of personal grooming? 3  Help from another person toileting, which includes using toliet, bedpan, or urinal? 2  Help from another person bathing (including washing, rinsing, drying)? 2  Help from another person to put on and taking off regular upper body clothing? 2  Help from another person to put on and taking off regular lower body clothing? 2  6 Click Score 14  OT Recommendation  Follow Up Recommendations SNF  OT Equipment None recommended by OT  Individuals Consulted  Consulted and Agree with Results and Recommendations Patient  Acute Rehab OT Goals  Patient Stated Goal Pt. goal is to go back home  OT Goal Formulation With  patient  Time For Goal Achievement 05/16/19  Potential to Achieve Goals Good  OT Time Calculation  OT Start Time (ACUTE ONLY) 1100  OT Stop Time (ACUTE ONLY) 1150  OT Time Calculation (min) 50 min  OT General Charges  $OT Visit 1 Visit  OT Evaluation  $OT Eval Moderate Complexity 1 Mod  OT Treatments  $Self Care/Home Management  8-22 mins  Written Expression  Dominant Hand Right  Reece Packer OT/L

## 2019-05-02 NOTE — NC FL2 (Signed)
[place] LEVEL OF CARE SCREENING TOOL     IDENTIFICATION  Patient Name: Kyle [name] Birthdate: [birth year] Sex: male Admission Date (Current Location): 04/29/2019  [facility] and [place] Number:  Engineering geologist and Address:  [facility], [place] [address], [place], [place] [address]      Provider Number: [id number]  Attending Physician Name and Address:  Eugenie Filler, MD  Relative Name and Phone Number:  [name] [phone]    Current Level of Care: Hospital Recommended Level of Care: [place] Prior Approval Number:    Date Approved/Denied:   PASRR Number: [id number]  Discharge Plan: SNF    Current Diagnoses: Patient Active Problem List   Diagnosis Date Noted  . Orthostasis 05/02/2019  . ESRD (end stage renal disease) on dialysis ([place])   . Acute blood loss anemia 04/30/2019  . Acute on chronic blood loss anemia 04/29/2019  . History of CVA (cerebrovascular accident) 04/29/2019  . GI bleed 04/04/2019  . 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[place]) 03/20/2019  . Generalized weakness   . Diabetic polyneuropathy associated with type 2 diabetes mellitus ([place])   . Cerebrovascular accident (CVA) ([place])   . PVD (peripheral vascular disease) ([place]) 03/17/2019  . Ptosis of eyelid, left 03/15/2019  . Physical deconditioning 03/14/2019  . Hypomagnesemia 03/13/2019  . Hyperphosphatemia 03/13/2019  . ESRD (end stage renal disease) ([place]) 03/13/2019  . Chronic kidney disease with peritoneal dialysis as preferred modality, stage 5 ([place]) 03/13/2019  . Prolonged QT interval 03/13/2019  . Hypertension associated with diabetes ([place]) 03/13/2019  . Hypothyroidism 03/13/2019  . Chronic diarrhea 03/13/2019  . Hypocalcemia 03/12/2019  . Hypokalemia 09/16/2018  . Lung nodule 06/12/2016  . Narrowing of airway   . Cigarette smoker 05/24/2016  . Collapse of right lung 05/24/2016  . COPD with chronic bronchitis ([place]) 05/24/2016  . Acne  05/20/2015  . Calculus of kidney 05/20/2015  . Chest pain, non-cardiac 05/20/2015  . Seizure disorder ([place]) 05/20/2015  . Current tobacco use 05/20/2015  . Chronic kidney disease (CKD), stage III (moderate) 03/29/2015  . Insulin dependent type 2 diabetes mellitus ([place]) 03/15/2015  . Lipoma of shoulder 03/08/2014  . Other synovitis and tenosynovitis, right shoulder 03/08/2014  . Bursitis of elbow 02/15/2014  . Absolute anemia 08/08/2013  . 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 Back pain, chronic 08/08/2013  . Chronic obstructive pulmonary disease ([place]) 08/08/2013  . BP (high blood pressure) 08/08/2013  . Hyperlipidemia associated with type 2 diabetes mellitus ([place]) 08/08/2013  . Acne erythematosa 08/08/2013    Orientation RESPIRATION BLADDER Height & Weight     Self, Time, Situation, Place  Normal   Weight: 186 lb 4.6 oz (84.5 kg) Height:  6' (182.9 cm)  BEHAVIORAL SYMPTOMS/MOOD NEUROLOGICAL BOWEL NUTRITION STATUS        Diet(see discharge summary)  AMBULATORY STATUS COMMUNICATION OF NEEDS Skin     Verbally Normal                       Personal Care Assistance Level of Assistance  Bathing, Feeding, Dressing           Functional Limitations Info    Sight Info: Adequate Hearing Info: Adequate      SPECIAL CARE FACTORS FREQUENCY  PT (By licensed PT), OT (By licensed OT)     PT Frequency: 5x a week OT Frequency: 5x a week            Contractures Contractures Info:  Not present    Additional Factors Info  Code Status, Allergies Code Status Info: Full Allergies Info: NKA           Current Medications (05/02/2019):  This is the current hospital active medication list Current Facility-Administered Medications  Medication Dose Route Frequency Provider Last Rate Last Admin  . acetaminophen (TYLENOL) tablet 650 mg  650 mg Oral Q6H PRN Lenore Cordia, MD   650 mg at 05/02/19 1236   Or  . acetaminophen (TYLENOL) suppository 650 mg  650 mg Rectal Q6H PRN  Zada Finders R, MD      . atorvastatin (LIPITOR) tablet 80 mg  80 mg Oral QHS Lenore Cordia, MD   80 mg at 05/01/19 2133  . calcium acetate (PHOSLO) capsule 667 mg  667 mg Oral TID WC Collins, Samantha G, PA-C   667 mg at 05/02/19 1157  . Chlorhexidine Gluconate Cloth 2 % PADS 6 each  6 each Topical Q0600 Janalee Dane, PA-C   6 each at 05/02/19 8264  . insulin aspart (novoLOG) injection 0-9 Units  0-9 Units Subcutaneous Q4H Lenore Cordia, MD   2 Units at 05/02/19 1158  . levETIRAcetam (KEPPRA) tablet 500 mg  500 mg Oral BID Zada Finders R, MD   500 mg at 05/02/19 0911  . levothyroxine (SYNTHROID) tablet 200 mcg  200 mcg Oral QAC breakfast Lenore Cordia, MD   200 mcg at 05/02/19 0514  . metoprolol succinate (TOPROL-XL) 24 hr tablet 50 mg  50 mg Oral Daily Darliss Cheney, MD   50 mg at 05/02/19 0911  . midodrine (PROAMATINE) tablet 10 mg  10 mg Oral Once Eugenie Filler, MD      . pantoprazole (PROTONIX) 80 mg in sodium chloride 0.9 % 100 mL (0.8 mg/mL) infusion  8 mg/hr Intravenous Continuous Drenda Freeze, MD 10 mL/hr at 05/02/19 0021 8 mg/hr at 05/02/19 0021     Discharge Medications: Please see discharge summary for a list of discharge medications.  Relevant Imaging Results:  Relevant Lab Results:   Additional Information [phone] - ESRD Dialysis M,W,FRI  Neysa Hotter [place], LCSW

## 2019-05-02 NOTE — Progress Notes (Addendum)
[facility] Progress Note   Subjective:    Patient seen in room. Feeling well, waiting for capsule endoscopy to pass. Denies SOB, cough, CP, palpitations, dizziness, abdominal pain, N/V/D. No melena reported.   Objective Vitals:   05/01/19 1830 05/01/19 1907 05/02/19 0423 05/02/19 0955  BP: (!) 117/57 (!) 122/58 140/72 (!) 144/76  Pulse: 90 90 92 89  Resp: 18 18    Temp:  98 F (36.7 C) 98.3 F (36.8 C) 98 F (36.7 C)  TempSrc:  Axillary Oral Tympanic  SpO2:  98% 98% 97%  Weight:  84.5 kg    Height:       Physical Exam General: Well developed, chronically ill appearing male in NAD Head: Normocephalic, atraumatic, L eye ptosis Lungs: Clear bilaterally to auscultation without wheezes, rales, or rhonchi. Breathing is unlabored. Heart: RRR with normal S1, S2. No murmurs, rubs, or gallops appreciated. Abdomen: Soft, non-tender, non-distended with normoactive bowel sounds. No rebound/guarding. No obvious abdominal masses. Lower extremities: No edema bilateral lower extremities Dialysis Access: [facility] without surrounding erythema/drainage. Maturing LUE AVF + bruit   Additional Objective Labs: Basic Metabolic Panel: Recent Labs  Lab 04/30/19 0442 05/01/19 0726 05/02/19 0556  NA 136 138 137  K 3.3* 3.6 3.8  CL 97* 97* 100  CO2 27 25 24   GLUCOSE 95 84 101*  BUN 11 22 12   CREATININE 4.75* 7.04* 4.26*  CALCIUM 7.3* 7.6* 7.6*   Liver Function Tests: Recent Labs  Lab 04/29/19 1910  AST 21  ALT 11  ALKPHOS 85  BILITOT 1.3*  PROT 5.5*  ALBUMIN 2.5*   CBC: Recent Labs  Lab 04/29/19 1910 04/29/19 1910 04/30/19 0442 04/30/19 0442 05/01/19 0726 05/01/19 2122 05/02/19 0556  WBC 11.3*   < > 8.1   < > 6.6 7.4 6.4  NEUTROABS 9.8*  --   --   --   --   --   --   HGB 6.6*   < > 8.5*   < > 9.0* 9.4* 8.9*  HCT 20.3*   < > 25.5*   < > 27.1* 28.6* 27.0*  MCV 100.5*  --  92.4  --  92.2 93.5 93.1  PLT 266   < > 243   < > 240 255 235   < > = values in this  interval not displayed.   Blood Culture    Component Value Date/Time   SDES BLOOD RIGHT ANTECUBITAL 04/08/2019 1612   SDES BLOOD RIGHT HAND 04/08/2019 1612   SPECREQUEST  04/08/2019 1612    BOTTLES DRAWN AEROBIC AND ANAEROBIC Blood Culture adequate volume   SPECREQUEST  04/08/2019 1612    BOTTLES DRAWN AEROBIC AND ANAEROBIC Blood Culture adequate volume   CULT  04/08/2019 1612    NO GROWTH 5 DAYS Performed at [facility], [place] [address] [place], [place] [address]    CULT  04/08/2019 1612    NO GROWTH 5 DAYS Performed at [facility], [place] [address], [place], [place] [address]    REPTSTATUS 04/13/2019 FINAL 04/08/2019 1612   REPTSTATUS 04/13/2019 FINAL 04/08/2019 1612    CBG: Recent Labs  Lab 05/01/19 0520 05/01/19 1125 05/01/19 2335 05/02/19 0437 05/02/19 0703  GLUCAP 83 86 199* 98 100*   Medications: . pantoprozole (PROTONIX) infusion 8 mg/hr (05/02/19 0021)   . amLODipine  2.5 mg Oral Daily  . atorvastatin  80 mg Oral QHS  . Chlorhexidine Gluconate Cloth  6 each Topical Q0600  . insulin aspart  0-9 Units  Subcutaneous Q4H  . levETIRAcetam  500 mg Oral BID  . levothyroxine  200 mcg Oral QAC breakfast  . metoprolol succinate  50 mg Oral Daily    Dialysis Orders: Center: [facility]  on MWF. 180NRe, Time 4 hours, BFR 400/DFR 800, 3K, 2.25Ca, TDC (AVF maturing), EDW  83.5kg (just lowered) Heparin 2000 unit bolus Mircera 100 mcg IV q 2 weeks- last dose 04/22/19 Calcium acetate 667mg  TID AC  Assessment/Plan: 1.  GI bleed: Recurrent, had recent admit in 03/2019, EGD during that work up revealed gastritis with peptic ulcer, colonoscopy with single non bleeding angiodysplastic lesion and diverticulosis. Hgb 6.6 on admission, received 2 units PRBC > 8.9 today. On PPI, capsule endoscopy in progress. 2.  ESRD:  MWF, completed HD yesterday without complication. K+ 3.3 on admission, given 69mEq potassium chloride. Euvolemic on exam, no  urgent indication for HD today. Continue MWF schedule, hold heparin. Hypokalemia resolved.  3.  Hypertension/volume: BP controlled, euvolemic on exam. Continue amlodipine and metoprolol. 4.  Anemia: See #1. Not due for ESA yet.  5.  Metabolic bone disease: Corrected Calcium 8.5. Outpatient binder calcium acetate, will resume now that patient is eating.  6.  Nutrition:  Renal diet with fluid restrictions 7. T2DM: insulin per primary 8. 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 Plavix on hold, per primary.   Anice Paganini, PA-C 05/02/2019, 10:55 AM  [facility] Pager: [phone]  Nephrology attending: Patient was seen and examined at bedside. Chart reviewed.  I agree with assessment and plan as outlined above. ESRD on HD admitted with acute blood loss anemia due to GI bleed.  Capsule endoscopy results pending.  Monitor hemoglobin.  Received dialysis yesterday, next HD on 3/8.  Clinically stable.  Katheran James, MD [facility].

## 2019-05-02 NOTE — TOC Initial Note (Signed)
Transition of Care [facility]) - Initial/Assessment Note    Patient Details  Name: Kyle [name] MRN: [id number] Date of Birth: [birth year]  Transition of Care [facility]) CM/SW Contact:    Arvella Merles, LCSW Phone Number: 05/02/2019, 4:16 PM  Clinical Narrative:                 CSW received consult for possible SNF placement at time of discharge. CSW met bedside with patient and his wife Kyle [name] regarding PT recommendation of SNF placement at time of discharge.  Both expressed understanding of PT recommendation and is agreeable to SNF placement at time of discharge. Patient and spouse reported they do not want patient returning to [facility] and would like a SNF in [place]. CSW expressed patient's dialysis may be a barrier to SNF placement in [facility], and that a dialysis center in the county would have to be located if patient were to relocate. Patient's wife expressed understanding of possible barrier. CSW discussed insurance authorization process.   CSW faxed patient out to [facility]. No further questions reported at this time. CSW to continue to follow and assist with discharge planning needs.   Expected Discharge Plan: Skilled Nursing Facility Barriers to Discharge: Continued Medical Work up, Ship broker   Patient Goals and CMS Choice   CMS Medicare.gov Compare Post Acute Care list provided to:: Patient Represenative (must comment)([name], spouse) Choice offered to / list presented to : Spouse  Expected Discharge Plan and Services Expected Discharge Plan: [place] arrangements for the past 2 months: Skilled Nursing Facility([facility])                                      Prior Living Arrangements/Services Living arrangements for the past 2 months: Skilled Nursing Facility([facility])   Patient language and need for interpreter reviewed:: Yes Do you feel safe going back to the place where you live?:  Yes      Need for Family Participation in Patient Care: Yes (Comment) Care giver support system in place?: Yes (comment)   Criminal Activity/Legal Involvement Pertinent to Current Situation/Hospitalization: No - Comment as needed  Activities of Daily Living      Permission Sought/Granted Permission sought to share information with : Facility Sport and exercise psychologist, Family Supports Permission granted to share information with : Yes, Verbal Permission Granted  Share Information with NAME: Kyle [name]  Permission granted to share info w AGENCY: SNF  Permission granted to share info w Relationship: Spouse  Permission granted to share info w Contact Information: [phone]  Emotional Assessment   Attitude/Demeanor/Rapport: Lethargic Affect (typically observed): Calm Orientation: : Oriented to Self, Oriented to Place, Oriented to  Time, Oriented to Situation Alcohol / Substance Use: Not Applicable Psych Involvement: No (comment)  Admission diagnosis:  ESRD (end stage renal disease) on dialysis ([place]) [N18.6, Z99.2] Gastrointestinal hemorrhage, unspecified gastrointestinal hemorrhage type [K92.2] Acute on chronic blood loss anemia [D62] Acute blood loss anemia [D62] Patient Active Problem List   Diagnosis Date Noted  . Orthostasis 05/02/2019  . ESRD (end stage renal disease) on dialysis ([place])   . Acute blood loss anemia 04/30/2019  . Acute on chronic blood loss anemia 04/29/2019  . History of CVA (cerebrovascular accident) 04/29/2019  . GI bleed 04/04/2019  . 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[place]) 03/20/2019  . Generalized weakness   . Diabetic polyneuropathy associated with type  2 diabetes mellitus ([place])   . Cerebrovascular accident (CVA) ([place])   . PVD (peripheral vascular disease) ([place]) 03/17/2019  . Ptosis of eyelid, left 03/15/2019  . Physical deconditioning 03/14/2019  . Hypomagnesemia 03/13/2019  . Hyperphosphatemia 03/13/2019  . ESRD (end stage renal disease) ([place]) 03/13/2019  . Chronic  kidney disease with peritoneal dialysis as preferred modality, stage 5 ([place]) 03/13/2019  . Prolonged QT interval 03/13/2019  . Hypertension associated with diabetes ([place]) 03/13/2019  . Hypothyroidism 03/13/2019  . Chronic diarrhea 03/13/2019  . Hypocalcemia 03/12/2019  . Hypokalemia 09/16/2018  . Lung nodule 06/12/2016  . Narrowing of airway   . Cigarette smoker 05/24/2016  . Collapse of right lung 05/24/2016  . COPD with chronic bronchitis ([place]) 05/24/2016  . Acne 05/20/2015  . Calculus of kidney 05/20/2015  . Chest pain, non-cardiac 05/20/2015  . Seizure disorder ([place]) 05/20/2015  . Current tobacco use 05/20/2015  . Chronic kidney disease (CKD), stage III (moderate) 03/29/2015  . Insulin dependent type 2 diabetes mellitus ([place]) 03/15/2015  . Lipoma of shoulder 03/08/2014  . Other synovitis and tenosynovitis, right shoulder 03/08/2014  . Bursitis of elbow 02/15/2014  . Absolute anemia 08/08/2013  . 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 Back pain, chronic 08/08/2013  . Chronic obstructive pulmonary disease ([place]) 08/08/2013  . BP (high blood pressure) 08/08/2013  . Hyperlipidemia associated with type 2 diabetes mellitus ([place]) 08/08/2013  . Acne erythematosa 08/08/2013   PCP:  Idelle Crouch, MD Pharmacy:   [facility] [address], [place] [address] Phone: [phone] Fax: [place], [place] [address] [place] [address] Phone: [phone] Fax: [phone]     Social Determinants of Health (SDOH) Interventions    Readmission Risk Interventions No flowsheet data found.

## 2019-05-02 NOTE — Plan of Care (Signed)

## 2019-05-02 NOTE — Progress Notes (Addendum)
PROGRESS NOTE    Kyle [name]  DZH:[id number] DOB: 24-Mar-1950 DOA: 04/29/2019 PCP: Idelle Crouch, MD    Brief Narrative:  HPI per Dr. 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  Patient reports he has been seeing dark black-colored stool for several days now. He has been feeling cold but otherwise denies any obvious bleeding including epistaxis, hemoptysis, hematemesis, hematuria, or bright red blood per rectum. He denies any chest pain or dyspnea.  He was recently hospitalized from 04/04/2019-04/16/2019 for GI bleeding. At time of that admission he was on both aspirin and Plavix for his recent stroke. He had an upper endoscopy on 04/05/2019 which showed gastritis and mucosal changes in the duodenum. He underwent colonoscopy on 04/09/2019 which showed a single nonbleeding colonic angiodysplastic lesion which was treated with a monopolar probe. Diverticulosis in the entire examined colon also noted.On discharge he was taken off aspirin and continued on Plavix alone as well as pantoprazole.  ED Course: Initial vitals showed BP 111/62, pulse 82, RR 18, temp 97.8 Fahrenheit, SPO2 97% on room air.  Labs notable for hemoglobin 6.6, WBC 11.3, platelets 266,000, sodium 133, potassium 3.4, bicarb 25, BUN 8, creatinine 3.93, serum glucose 173, PT 12.5, INR 0.9, FOBT positive.  EDP discussed with on-call GI who will see in consultation. Patient was started on IV Protonix infusion. EDP also discussed with on-call nephrology. Patient was ordered to receive 2 unit PRBC transfusion. The  hospitalist service was consulted admit for further evaluation management.    Assessment & Plan:   Principal Problem:   Acute on chronic blood loss anemia Active Problems:   Chronic obstructive pulmonary disease (HCC)   Hyperlipidemia associated with type 2 diabetes mellitus (HCC)   Seizure disorder (HCC)   Insulin dependent type 2 diabetes mellitus (HCC)   ESRD (end stage renal disease) ([place])   Hypertension associated with diabetes ([place])   Hypothyroidism   History of CVA (cerebrovascular accident)   Acute blood loss anemia   Orthostasis  1 acute on chronic blood loss anemia/GI bleed Concern for upper GI bleed.  Patient noted to have EGD 04/05/2019 that showed gastritis with duodenal peptic ulcer injury.  Colonoscopy done 04/09/2019 showed a single nonbleeding colonic angiodysplastic lesion status post monopolar probe and diverticulosis throughout the colon.  Patient had presented back with melanotic stools and noted to have a hemoglobin of 6.6 on admission.  Status post 2 units transfusion of packed red blood cells and patient placed on a Protonix drip.  Hemoglobin currently at 8.9.  Patient seen in consultation by GI who are recommending to hold off on endoscopy at this time and feel his bleeding is related to gastritis and duodenal mucosa changes noted on EGD from February 2021 in the setting of medication noncompliance.  Capsule endoscopy study in process.  Repeat H&H this afternoon.  Continue to hold Plavix.  Per GI.  2.  History of left midbrain CVA January 2021, other nonhemorrhagic Patient with residual right-sided weakness and left-sided ptosis with 3rd cranial nerve palsy.  Patient was on both aspirin and Plavix but aspirin discontinued during last admission due to GI bleed and patient placed on Plavix alone.  Patient currently presenting with  concerns for ongoing GI bleeding as such Plavix on hold.  GI to recommend when Plavix may be resumed or whether patient needs to be on low-dose  aspirin.  PT/OT.  3.  End-stage renal disease on hemodialysis Monday Wednesday Friday Patient underwent hemodialysis yesterday 05/01/2019 without any complications.  Per nephrology.  4.  Orthostatic hypotension Patient noted to have significant orthostasis per OT today.  Systolic blood pressure went from 106 to 66 and noted to be symptomatic.  Will discontinue Norvasc.  Given normal saline 500 cc bolus x1.  Midodrine 10 mg p.o. x1.  Repeat H&H this afternoon.  Repeat orthostatics in the morning.  Discussed with nephrology.  Follow.  5.  Insulin-dependent diabetes mellitus type 2 Hemoglobin A1c 7.1 on 03/17/2019.  CBG of 98 this morning.  Change sliding scale insulin to before meals and at bedtime..  6.  Hypertension Patient noted to be orthostatic.  Discontinue Norvasc.  We will continue Toprol-XL for now.  See #4.  7.  Hypothyroidism Continue home dose Synthroid.  8.  Seizure disorder Stable.  Continue Keppra.   DVT prophylaxis: SCDs Code Status: Full Family Communication: Updated patient.  Updated wife on the telephone.  Disposition Plan:  . Patient came from: Skilled nursing facility            . Anticipated d/c place: Skilled nursing facility . Barriers to d/c OR conditions which need to be met to effect a safe d/c: Active GI bleed needing further GI work-up which is in process with capsule endoscopy, H&H needs to be stable, patient on IV PPI, patient also noted to be orthostatic.   Consultants:   Gastroenterology: Dr. Cristina Gong 04/30/2019  Nephrology: Dr. Carolin Sicks 04/30/2019  Procedures:   Capsule endoscopy  Transfusion of 2 units packed red blood cells   Antimicrobials:   None   Subjective: Patient noted to be orthostatic per OT and noted systolic blood pressure to drop from 106 to 66 on standing with lightheadedness and dizziness.  Patient denies any shortness of breath.  Patient denies any further active bleed however per OT patient had a small smear of dark stool.   Patient underwent hemodialysis without any complications on 8/0/3212.  Objective: Vitals:   05/01/19 1907 05/02/19 0423 05/02/19 0955 05/02/19 1100  BP: (!) 122/58 140/72 (!) 144/76 106/76  Pulse: 90 92 89   Resp: 18     Temp: 98 F (36.7 C) 98.3 F (36.8 C) 98 F (36.7 C)   TempSrc: Axillary Oral Tympanic   SpO2: 98% 98% 97%   Weight: 84.5 kg     Height:        Intake/Output Summary (Last 24 hours) at 05/02/2019 1141 Last data filed at 05/02/2019 0600 Gross per 24 hour  Intake 609.38 ml  Output 1500 ml  Net -890.62 ml   Filed Weights   05/01/19 0815 05/01/19 1500 05/01/19 1907  Weight: 82.9 kg 86 kg 84.5 kg    Examination:  General exam: Appears calm and comfortable  Respiratory system: Clear to auscultation. Respiratory effort normal. Cardiovascular system: S1 & S2 heard, RRR. No JVD, murmurs, rubs, gallops or clicks. No pedal edema. Gastrointestinal system: Abdomen is nondistended, soft and nontender. No organomegaly or masses felt. Normal bowel sounds heard. Central nervous system: Alert and oriented.  Left-sided ptosis with cranial nerve III palsy, otherwise no other focal neurological deficits.  Extremities: Symmetric 5 x 5 power. Skin: No rashes, lesions or ulcers Psychiatry: Judgement and insight appear normal. Mood & affect appropriate.  Data Reviewed: I have personally reviewed following labs and imaging studies  CBC: Recent Labs  Lab 04/29/19 1910 04/30/19 0442 05/01/19 0726 05/01/19 2122 05/02/19 0556  WBC 11.3* 8.1 6.6 7.4 6.4  NEUTROABS 9.8*  --   --   --   --   HGB 6.6* 8.5* 9.0* 9.4* 8.9*  HCT 20.3* 25.5* 27.1* 28.6* 27.0*  MCV 100.5* 92.4 92.2 93.5 93.1  PLT 266 243 240 255 119   Basic Metabolic Panel: Recent Labs  Lab 04/29/19 1910 04/30/19 0442 05/01/19 0726 05/02/19 0556  NA 133* 136 138 137  K 3.4* 3.3* 3.6 3.8  CL 92* 97* 97* 100  CO2 '25 27 25 24  '$ GLUCOSE 173* 95 84 101*  BUN '8 11 22 12  '$ CREATININE 3.93* 4.75* 7.04* 4.26*    CALCIUM 7.4* 7.3* 7.6* 7.6*   GFR: Estimated Creatinine Clearance: 17.7 mL/min (A) (by C-G formula based on SCr of 4.26 mg/dL (H)). Liver Function Tests: Recent Labs  Lab 04/29/19 1910  AST 21  ALT 11  ALKPHOS 85  BILITOT 1.3*  PROT 5.5*  ALBUMIN 2.5*   No results for input(s): LIPASE, AMYLASE in the last 168 hours. No results for input(s): AMMONIA in the last 168 hours. Coagulation Profile: Recent Labs  Lab 04/29/19 1910  INR 0.9   Cardiac Enzymes: No results for input(s): CKTOTAL, CKMB, CKMBINDEX, TROPONINI in the last 168 hours. BNP (last 3 results) No results for input(s): PROBNP in the last 8760 hours. HbA1C: No results for input(s): HGBA1C in the last 72 hours. CBG: Recent Labs  Lab 05/01/19 1125 05/01/19 2335 05/02/19 0437 05/02/19 0703 05/02/19 1104  GLUCAP 86 199* 98 100* 170*   Lipid Profile: No results for input(s): CHOL, HDL, LDLCALC, TRIG, CHOLHDL, LDLDIRECT in the last 72 hours. Thyroid Function Tests: No results for input(s): TSH, T4TOTAL, FREET4, T3FREE, THYROIDAB in the last 72 hours. Anemia Panel: No results for input(s): VITAMINB12, FOLATE, FERRITIN, TIBC, IRON, RETICCTPCT in the last 72 hours. Sepsis Labs: No results for input(s): PROCALCITON, LATICACIDVEN in the last 168 hours.  Recent Results (from the past 240 hour(s))  SARS CORONAVIRUS 2 (TAT 6-24 HRS) Nasopharyngeal Nasopharyngeal Swab     Status: None   Collection Time: 04/30/19 11:48 AM   Specimen: Nasopharyngeal Swab  Result Value Ref Range Status   SARS Coronavirus 2 NEGATIVE NEGATIVE Final    Comment: (NOTE) SARS-CoV-2 target nucleic acids are NOT DETECTED. The SARS-CoV-2 RNA is generally detectable in upper and lower respiratory specimens during the acute phase of infection. Negative results do not preclude SARS-CoV-2 infection, do not rule out co-infections with other pathogens, and should not be used as the sole basis for treatment or other patient management  decisions. Negative results must be combined with clinical observations, patient history, and epidemiological information. The expected result is Negative. Fact Sheet for Patients: SugarRoll.be Fact Sheet for Healthcare Providers: https://www.woods-mathews.com/ This test is not yet approved or cleared by the [place] FDA and  has been authorized for detection and/or diagnosis of SARS-CoV-2 by FDA under an Emergency Use Authorization (EUA). This EUA will remain  in effect (meaning this test can be used) for the duration of the COVID-19 declaration under Section 56 4(b)(1) of the Act, 21 U.S.C. section 360bbb-3(b)(1), unless the authorization is terminated or revoked sooner. Performed at [facility], [place] [address], [place], [place] [address]          Radiology Studies: No results found.      Scheduled Meds: .  amLODipine  2.5 mg Oral Daily  . atorvastatin  80 mg Oral QHS  . calcium acetate  667 mg Oral TID WC  . Chlorhexidine Gluconate Cloth  6 each Topical Q0600  . insulin aspart  0-9 Units Subcutaneous Q4H  . levETIRAcetam  500 mg Oral BID  . levothyroxine  200 mcg Oral QAC breakfast  . metoprolol succinate  50 mg Oral Daily   Continuous Infusions: . pantoprozole (PROTONIX) infusion 8 mg/hr (05/02/19 0021)     LOS: 2 days    Time spent: 40 minutes    Irine Seal, MD [facility]   To contact the attending provider between 7A-7P or the covering provider during after hours 7P-7A, please log into the web site [email or web address] and access using universal [place] password for that web site. If you do not have the password, please call the hospital operator.  05/02/2019, 11:41 AM

## 2019-05-03 ENCOUNTER — Other Ambulatory Visit: Payer: Self-pay

## 2019-05-03 ENCOUNTER — Encounter (HOSPITAL_COMMUNITY): Payer: Self-pay | Admitting: Family Medicine

## 2019-05-03 LAB — CBC WITH DIFFERENTIAL/PLATELET
Abs Immature Granulocytes: 0.03 10*3/uL (ref 0.00–0.07)
Basophils Absolute: 0.1 10*3/uL (ref 0.0–0.1)
Basophils Relative: 1 %
Eosinophils Absolute: 0.1 10*3/uL (ref 0.0–0.5)
Eosinophils Relative: 2 %
HCT: 26.7 % — ABNORMAL LOW (ref 39.0–52.0)
Hemoglobin: 8.8 g/dL — ABNORMAL LOW (ref 13.0–17.0)
Immature Granulocytes: 0 %
Lymphocytes Relative: 19 %
Lymphs Abs: 1.4 10*3/uL (ref 0.7–4.0)
MCH: 31 pg (ref 26.0–34.0)
MCHC: 33 g/dL (ref 30.0–36.0)
MCV: 94 fL (ref 80.0–100.0)
Monocytes Absolute: 0.6 10*3/uL (ref 0.1–1.0)
Monocytes Relative: 8 %
Neutro Abs: 5 10*3/uL (ref 1.7–7.7)
Neutrophils Relative %: 70 %
Platelets: 245 10*3/uL (ref 150–400)
RBC: 2.84 MIL/uL — ABNORMAL LOW (ref 4.22–5.81)
RDW: 17.2 % — ABNORMAL HIGH (ref 11.5–15.5)
WBC: 7.2 10*3/uL (ref 4.0–10.5)
nRBC: 0 % (ref 0.0–0.2)

## 2019-05-03 LAB — RENAL FUNCTION PANEL
Albumin: 2.3 g/dL — ABNORMAL LOW (ref 3.5–5.0)
Anion gap: 14 (ref 5–15)
BUN: 23 mg/dL (ref 8–23)
CO2: 25 mmol/L (ref 22–32)
Calcium: 7.7 mg/dL — ABNORMAL LOW (ref 8.9–10.3)
Chloride: 100 mmol/L (ref 98–111)
Creatinine, Ser: 6.44 mg/dL — ABNORMAL HIGH (ref 0.61–1.24)
GFR calc Af Amer: 9 mL/min — ABNORMAL LOW (ref >60)
GFR calc non Af Amer: 8 mL/min — ABNORMAL LOW (ref >60)
Glucose, Bld: 158 mg/dL — ABNORMAL HIGH (ref 70–99)
Phosphorus: 2.1 mg/dL — ABNORMAL LOW (ref 2.5–4.6)
Potassium: 3.7 mmol/L (ref 3.5–5.1)
Sodium: 139 mmol/L (ref 135–145)

## 2019-05-03 LAB — GLUCOSE, CAPILLARY
Glucose-Capillary: 124 mg/dL — ABNORMAL HIGH (ref 70–99)
Glucose-Capillary: 223 mg/dL — ABNORMAL HIGH (ref 70–99)
Glucose-Capillary: 134 mg/dL — ABNORMAL HIGH (ref 70–99)

## 2019-05-03 MED ORDER — CHLORHEXIDINE GLUCONATE CLOTH 2 % EX PADS
6.0000 | MEDICATED_PAD | Freq: Every day | CUTANEOUS | Status: DC
  Administered 2019-05-03 – 2019-05-05 (×2): 6 via TOPICAL

## 2019-05-03 MED ORDER — PRO-STAT SUGAR FREE PO LIQD
30.0000 mL | Freq: Two times a day (BID) | ORAL | Status: DC
  Administered 2019-05-03 – 2019-05-07 (×5): 30 mL via ORAL
  Filled 2019-05-03 (×7): qty 30

## 2019-05-03 MED ORDER — TRAMADOL HCL 50 MG PO TABS
100.0000 mg | ORAL_TABLET | Freq: Two times a day (BID) | ORAL | Status: DC | PRN
  Administered 2019-05-03 – 2019-05-06 (×4): 100 mg via ORAL
  Filled 2019-05-03 (×5): qty 2

## 2019-05-03 NOTE — Progress Notes (Signed)
Obscure GI bleeding with posthemorrhagic anemia.    Hemoglobin stable over the past 72 hours.  Recent EGD/colon unrevealing, so capsule endoscopy has been performed and results are pending.  Patient is not aware of having passed the capsule into his stool.  Cleotis Nipper, M.D. Pager [phone] If no answer or after 5 PM call [phone]

## 2019-05-03 NOTE — Plan of Care (Signed)

## 2019-05-03 NOTE — Progress Notes (Addendum)
[facility] Progress Note   Subjective:   Patient seen in room, sleeping. Significant orthostatic dizziness with OT yesterday though patient does not remember becoming dizzy. Amlodipine discontinued. Patient denies SOB, orthopnea, cough, CP, palpitations, dizziness, abdominal pain, N/V/D.  Objective Vitals:   05/02/19 1100 05/02/19 1633 05/02/19 2048 05/03/19 0503  BP: 106/76 132/79 (!) 119/56 (!) 137/57  Pulse:  84 68 66  Resp:  17    Temp:   98.3 F (36.8 C) 98.2 F (36.8 C)  TempSrc:   Oral Oral  SpO2:  98% 99% 99%  Weight:      Height:       Physical Exam General:Well developed, chronically ill appearing male in NAD Head:Normocephalic, atraumatic, L eye ptosis Lungs: Clear bilaterally to auscultation without wheezes, rales, or rhonchi. Breathing is unlabored. Heart:RRR with normal S1, S2. No murmurs, rubs, or gallops appreciated. Abdomen: Soft, non-tender, non-distended with normoactive bowel sounds. No rebound/guarding. No obvious abdominal masses. Lower extremities: No edemabilateral lower extremities Dialysis Access:TDC without surrounding erythema/drainage. Maturing LUE AVF + bruit  Additional Objective Labs: Basic Metabolic Panel: Recent Labs  Lab 05/01/19 0726 05/02/19 0556 05/03/19 0402  NA 138 137 139  K 3.6 3.8 3.7  CL 97* 100 100  CO2 25 24 25   GLUCOSE 84 101* 158*  BUN 22 12 23   CREATININE 7.04* 4.26* 6.44*  CALCIUM 7.6* 7.6* 7.7*  PHOS  --   --  2.1*   Liver Function Tests: Recent Labs  Lab 04/29/19 1910 05/03/19 0402  AST 21  --   ALT 11  --   ALKPHOS 85  --   BILITOT 1.3*  --   PROT 5.5*  --   ALBUMIN 2.5* 2.3*   CBC: Recent Labs  Lab 04/29/19 1910 04/29/19 1910 04/30/19 0442 04/30/19 0442 05/01/19 0726 05/01/19 0726 05/01/19 2122 05/01/19 2122 05/02/19 0556 05/02/19 1430 05/03/19 0402  WBC 11.3*   < > 8.1   < > 6.6   < > 7.4  --  6.4  --  7.2  NEUTROABS 9.8*  --   --   --   --   --   --   --   --   --   5.0  HGB 6.6*   < > 8.5*   < > 9.0*   < > 9.4*   < > 8.9* 8.7* 8.8*  HCT 20.3*   < > 25.5*   < > 27.1*   < > 28.6*   < > 27.0* 26.1* 26.7*  MCV 100.5*   < > 92.4  --  92.2  --  93.5  --  93.1  --  94.0  PLT 266   < > 243   < > 240   < > 255  --  235  --  245   < > = values in this interval not displayed.   Blood Culture    Component Value Date/Time   SDES BLOOD RIGHT ANTECUBITAL 04/08/2019 1612   SDES BLOOD RIGHT HAND 04/08/2019 1612   SPECREQUEST  04/08/2019 1612    BOTTLES DRAWN AEROBIC AND ANAEROBIC Blood Culture adequate volume   SPECREQUEST  04/08/2019 1612    BOTTLES DRAWN AEROBIC AND ANAEROBIC Blood Culture adequate volume   CULT  04/08/2019 1612    NO GROWTH 5 DAYS Performed at [facility], [place] [address], [place], [place] [address]    CULT  04/08/2019 1612    NO GROWTH 5 DAYS Performed at [facility],  [address] [place], [place] [address]    REPTSTATUS 04/13/2019 FINAL 04/08/2019 1612   REPTSTATUS 04/13/2019 FINAL 04/08/2019 1612    CBG: Recent Labs  Lab 05/02/19 0703 05/02/19 1104 05/02/19 1629 05/02/19 2158 05/03/19 0649  GLUCAP 100* 170* 248* 248* 124*    Studies/Results: DG CHEST PORT 1 VIEW  Result Date: 05/02/2019 CLINICAL DATA:  Cough. EXAM: PORTABLE CHEST 1 VIEW COMPARISON:  04/08/2019 FINDINGS: Mildly enlarged cardiac silhouette without significant change. Clear lungs with normal vascularity. Stable right jugular double-lumen catheter with its tip in the superior vena cava. Thoracic spine degenerative changes. Displaced right posterior 5th rib fracture and additional probable mildly displaced posterior rib fractures on the right. There is also a mildly displaced right lateral 6th rib fracture and additional healed or healing right rib fractures laterally. Old, healed 6th posterior rib fracture thoracic spine degenerative changes. Upper mediastinal surgical clips. Surgical clips at the base of the neck on the right. IMPRESSION: 1. No acute  abnormality. 2. Multiple right rib fractures, as described above. 3. Stable mild cardiomegaly. Electronically Signed   By: Claudie Revering M.D.   On: 05/02/2019 15:35   Medications:  . atorvastatin  80 mg Oral QHS  . calcium acetate  667 mg Oral TID WC  . Chlorhexidine Gluconate Cloth  6 each Topical Q0600  . insulin aspart  0-9 Units Subcutaneous Q4H  . levETIRAcetam  500 mg Oral BID  . levothyroxine  200 mcg Oral QAC breakfast  . metoprolol succinate  50 mg Oral Daily    [facility] Time 4 hours, BFR 400/DFR 800, 3K, 2.25Ca, TDC(AVF maturing), EDW 83.5kg (just lowered) Heparin 2000 unit bolus Mircera 100 mcg IV q 2 weeks- last dose 04/22/19 Calcium acetate 667mg  TID AC  Assessment/Plan: 1. GI bleed: Recurrent, had recent admit in 03/2019, EGD during that work up revealed gastritis with peptic ulcer, colonoscopy with single non bleeding angiodysplastic lesion and diverticulosis. Hgb 6.6 on admission, received 2 units PRBC > 8.5 >> 8.8 today. Getting capsule study. On PPI, GI following. 2. ESRD:on MWF. K+ 3.7, will use 4K bath.Plan for HD today per regular schedule.  No heparin.  3. Hypertension/volume: BP controlled at present but had orthostatic hypotension yesterday. Required a dose of midodrine and 250 mL bolus.  Amlodipine discontinued. Euvolemic on exam, minimal UF with HD tomorrow.  4. Anemia:See #1. ESA due 4/68. 5. Metabolic bone disease:Corrected Calcium 9.1. Outpatient binder calcium acetate but phos 2.1- hold binder for now. 6. Nutrition:Renal diet with fluid restrictions. Albumin low, will order pro-stat 7. T2DM: insulin per primary 8. 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 Plavix on hold, per primary.   Anice Paganini, PA-C 05/03/2019, 9:24 AM  [facility] Pager: [phone]  Nephrology attending: Patient was seen and examined at bedside.  Chart  reviewed.  I agree with assessment and plan as outlined above. ESRD on HD admitted with acute blood loss anemia in the setting of GI bleed.  GI is doing evaluation including capsule endoscopy.  No further bleed noted.  He was hypotensive yesterday therefore received 250 cc bolus and a dose of midodrine.  Norvasc discontinued.  Monitor blood pressure.  Lower UF goal for HD tomorrow.  Clinically stable.  Katheran James, MD [facility].

## 2019-05-03 NOTE — Progress Notes (Signed)
PROGRESS NOTE    Kyle [name]  XJO:[id number] DOB: November 28, 1950 DOA: 04/29/2019 PCP: Idelle Crouch, MD    Brief Narrative:  HPI per Dr. 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  Patient reports he has been seeing dark black-colored stool for several days now. He has been feeling cold but otherwise denies any obvious bleeding including epistaxis, hemoptysis, hematemesis, hematuria, or bright red blood per rectum. He denies any chest pain or dyspnea.  He was recently hospitalized from 04/04/2019-04/16/2019 for GI bleeding. At time of that admission he was on both aspirin and Plavix for his recent stroke. He had an upper endoscopy on 04/05/2019 which showed gastritis and mucosal changes in the duodenum. He underwent colonoscopy on 04/09/2019 which showed a single nonbleeding colonic angiodysplastic lesion which was treated with a monopolar probe. Diverticulosis in the entire examined colon also noted.On discharge he was taken off aspirin and continued on Plavix alone as well as pantoprazole.  ED Course: Initial vitals showed BP 111/62, pulse 82, RR 18, temp 97.8 Fahrenheit, SPO2 97% on room air.  Labs notable for hemoglobin 6.6, WBC 11.3, platelets 266,000, sodium 133, potassium 3.4, bicarb 25, BUN 8, creatinine 3.93, serum glucose 173, PT 12.5, INR 0.9, FOBT positive.  EDP discussed with on-call GI who will see in consultation. Patient was started on IV Protonix infusion. EDP also discussed with on-call nephrology. Patient was ordered to receive 2 unit PRBC transfusion. The  hospitalist service was consulted admit for further evaluation management.    Assessment & Plan:   Principal Problem:   Acute on chronic blood loss anemia Active Problems:   Chronic obstructive pulmonary disease (HCC)   Hyperlipidemia associated with type 2 diabetes mellitus (HCC)   Seizure disorder (HCC)   Insulin dependent type 2 diabetes mellitus (HCC)   ESRD (end stage renal disease) ([place])   Hypertension associated with diabetes ([place])   Hypothyroidism   History of CVA (cerebrovascular accident)   Acute blood loss anemia   Orthostasis   ESRD (end stage renal disease) on dialysis ([place])  1 acute on chronic blood loss anemia/GI bleed Concern for upper GI bleed.  Patient noted to have EGD 04/05/2019 that showed gastritis with duodenal peptic ulcer injury.  Colonoscopy done 04/09/2019 showed a single nonbleeding colonic angiodysplastic lesion status post monopolar probe and diverticulosis throughout the colon.  Patient had presented back with melanotic stools and noted to have a hemoglobin of 6.6 on admission.  Status post 2 units transfusion of packed red blood cells and patient placed on a Protonix drip.  Hemoglobin currently at 8.8.  Patient seen in consultation by GI who are recommending to hold off on endoscopy at this time and feel his bleeding is related to gastritis and duodenal mucosa changes noted on EGD from February 2021 in the setting of medication noncompliance.  Capsule endoscopy study in process.  Repeat H&H this afternoon.  Continue to hold Plavix.  Per GI.  2.  History of left midbrain CVA January 2021, other nonhemorrhagic Patient with residual right-sided weakness and left-sided ptosis with 3rd cranial nerve palsy.  Patient was on both aspirin and Plavix but aspirin discontinued during last admission due to GI bleed and  patient placed on Plavix alone.  Patient currently presenting with concerns for ongoing GI bleeding as such Plavix on hold.  GI to recommend when Plavix may  be resumed or whether patient needs to be on low-dose aspirin.  PT/OT.  3.  End-stage renal disease on hemodialysis Monday Wednesday Friday Patient underwent hemodialysis 05/01/2019 without any complications.  Per nephrology.  4.  Orthostatic hypotension Patient noted to have significant orthostasis per OT on 05/02/2019.  Systolic blood pressure went from 106 to 66 and noted to be symptomatic.  Patient still orthostatic today however some improvement.  Patient symptomatic.  Norvasc has been discontinued.  Patient received a 1 dose of midodrine yesterday in addition to a saline bolus.  H&H stable.  Patient with no further overt bleeding.  TED hose.  May need to be placed on scheduled midodrine.  Follow.   5.  Insulin-dependent diabetes mellitus type 2 Hemoglobin A1c 7.1 on 03/17/2019.  CBG of 124 this morning.  Continue sliding scale insulin.   6.  Hypertension Patient noted to be orthostatic.  Norvasc discontinued secondary to orthostasis.  Continue Toprol-XL.   7.  Hypothyroidism Synthroid.   8.  Seizure disorder Keppra.     DVT prophylaxis: SCDs Code Status: Full Family Communication: Updated patient.  No family at bedside. Disposition Plan:  . Patient came from: Skilled nursing facility            . Anticipated d/c place: Skilled nursing facility . Barriers to d/c OR conditions which need to be met to effect a safe d/c: Active GI bleed needing further GI work-up which is in process with capsule endoscopy, H&H needs to be stable, patient on IV PPI, patient also noted to be orthostatic.   Consultants:   Gastroenterology: Dr. Cristina Gong 04/30/2019  Nephrology: Dr. Carolin Sicks 04/30/2019  Procedures:   Capsule endoscopy  Transfusion of 2 units packed red blood cells   Antimicrobials:   None   Subjective: Patient sleeping but arousable.  Noted to be orthostatic yesterday.  Patient still orthostatic today and does endorse some dizziness with orthostatic vital signs were being obtained.   Denies any chest pain or shortness of breath.  Denies any further bleeding.  Capsule endoscopy underway.   Objective: Vitals:   05/02/19 1100 05/02/19 1633 05/02/19 2048 05/03/19 0503  BP: 106/76 132/79 (!) 119/56 (!) 137/57  Pulse:  84 68 66  Resp:  17    Temp:   98.3 F (36.8 C) 98.2 F (36.8 C)  TempSrc:   Oral Oral  SpO2:  98% 99% 99%  Weight:      Height:        Intake/Output Summary (Last 24 hours) at 05/03/2019 1153 Last data filed at 05/03/2019 0644 Gross per 24 hour  Intake 530 ml  Output --  Net 530 ml   Filed Weights   05/01/19 0815 05/01/19 1500 05/01/19 1907  Weight: 82.9 kg 86 kg 84.5 kg    Examination:  General exam: NAD. Respiratory system: Lungs clear to auscultation bilaterally.  No wheezes, no crackles, no rhonchi.  Normal respiratory effort.  Cardiovascular system: Regular rate rhythm no murmurs rubs or gallops.  No JVD.  No lower extremity edema. Gastrointestinal system: Abdomen is soft, nontender, nondistended, positive bowel sounds.  No rebound.  No guarding.  Central nervous system: Alert and oriented.  Left-sided ptosis with cranial nerve III palsy, otherwise no other focal neurological deficits.  Extremities: Symmetric 5 x 5 power. Skin: No rashes, lesions or ulcers Psychiatry: Judgement and insight  appear normal. Mood & affect appropriate.     Data Reviewed: I have personally reviewed following labs and imaging studies  CBC: Recent Labs  Lab 04/29/19 1910 04/29/19 1910 04/30/19 0442 04/30/19 0442 05/01/19 0726 05/01/19 2122 05/02/19 0556 05/02/19 1430 05/03/19 0402  WBC 11.3*   < > 8.1  --  6.6 7.4 6.4  --  7.2  NEUTROABS 9.8*  --   --   --   --   --   --   --  5.0  HGB 6.6*   < > 8.5*   < > 9.0* 9.4* 8.9* 8.7* 8.8*  HCT 20.3*   < > 25.5*   < > 27.1* 28.6* 27.0* 26.1* 26.7*  MCV 100.5*   < > 92.4  --  92.2 93.5 93.1  --  94.0  PLT 266   < > 243  --  240 255 235  --  245   < > = values in this interval not displayed.   Basic  Metabolic Panel: Recent Labs  Lab 04/29/19 1910 04/30/19 0442 05/01/19 0726 05/02/19 0556 05/03/19 0402  NA 133* 136 138 137 139  K 3.4* 3.3* 3.6 3.8 3.7  CL 92* 97* 97* 100 100  CO2 '25 27 25 24 25  '$ GLUCOSE 173* 95 84 101* 158*  BUN '8 11 22 12 23  '$ CREATININE 3.93* 4.75* 7.04* 4.26* 6.44*  CALCIUM 7.4* 7.3* 7.6* 7.6* 7.7*  PHOS  --   --   --   --  2.1*   GFR: Estimated Creatinine Clearance: 11.7 mL/min (A) (by C-G formula based on SCr of 6.44 mg/dL (H)). Liver Function Tests: Recent Labs  Lab 04/29/19 1910 05/03/19 0402  AST 21  --   ALT 11  --   ALKPHOS 85  --   BILITOT 1.3*  --   PROT 5.5*  --   ALBUMIN 2.5* 2.3*   No results for input(s): LIPASE, AMYLASE in the last 168 hours. No results for input(s): AMMONIA in the last 168 hours. Coagulation Profile: Recent Labs  Lab 04/29/19 1910  INR 0.9   Cardiac Enzymes: No results for input(s): CKTOTAL, CKMB, CKMBINDEX, TROPONINI in the last 168 hours. BNP (last 3 results) No results for input(s): PROBNP in the last 8760 hours. HbA1C: No results for input(s): HGBA1C in the last 72 hours. CBG: Recent Labs  Lab 05/02/19 0703 05/02/19 1104 05/02/19 1629 05/02/19 2158 05/03/19 0649  GLUCAP 100* 170* 248* 248* 124*   Lipid Profile: No results for input(s): CHOL, HDL, LDLCALC, TRIG, CHOLHDL, LDLDIRECT in the last 72 hours. Thyroid Function Tests: No results for input(s): TSH, T4TOTAL, FREET4, T3FREE, THYROIDAB in the last 72 hours. Anemia Panel: No results for input(s): VITAMINB12, FOLATE, FERRITIN, TIBC, IRON, RETICCTPCT in the last 72 hours. Sepsis Labs: No results for input(s): PROCALCITON, LATICACIDVEN in the last 168 hours.  Recent Results (from the past 240 hour(s))  SARS CORONAVIRUS 2 (TAT 6-24 HRS) Nasopharyngeal Nasopharyngeal Swab     Status: None   Collection Time: 04/30/19 11:48 AM   Specimen: Nasopharyngeal Swab  Result Value Ref Range Status   SARS Coronavirus 2 NEGATIVE NEGATIVE Final     Comment: (NOTE) SARS-CoV-2 target nucleic acids are NOT DETECTED. The SARS-CoV-2 RNA is generally detectable in upper and lower respiratory specimens during the acute phase of infection. Negative results do not preclude SARS-CoV-2 infection, do not rule out co-infections with other pathogens, and should not be used as the sole basis for treatment or other patient management decisions.  Negative results must be combined with clinical observations, patient history, and epidemiological information. The expected result is Negative. Fact Sheet for Patients: SugarRoll.be Fact Sheet for Healthcare Providers: https://www.woods-mathews.com/ This test is not yet approved or cleared by the [place] FDA and  has been authorized for detection and/or diagnosis of SARS-CoV-2 by FDA under an Emergency Use Authorization (EUA). This EUA will remain  in effect (meaning this test can be used) for the duration of the COVID-19 declaration under Section 56 4(b)(1) of the Act, 21 U.S.C. section 360bbb-3(b)(1), unless the authorization is terminated or revoked sooner. Performed at [facility], [place] [address], [place], [place] [address]          Radiology Studies: DG CHEST PORT 1 VIEW  Result Date: 05/02/2019 CLINICAL DATA:  Cough. EXAM: PORTABLE CHEST 1 VIEW COMPARISON:  04/08/2019 FINDINGS: Mildly enlarged cardiac silhouette without significant change. Clear lungs with normal vascularity. Stable right jugular double-lumen catheter with its tip in the superior vena cava. Thoracic spine degenerative changes. Displaced right posterior 5th rib fracture and additional probable mildly displaced posterior rib fractures on the right. There is also a mildly displaced right lateral 6th rib fracture and additional healed or healing right rib fractures laterally. Old, healed 6th posterior rib fracture thoracic spine degenerative changes. Upper mediastinal surgical  clips. Surgical clips at the base of the neck on the right. IMPRESSION: 1. No acute abnormality. 2. Multiple right rib fractures, as described above. 3. Stable mild cardiomegaly. Electronically Signed   By: Claudie Revering M.D.   On: 05/02/2019 15:35        Scheduled Meds: . atorvastatin  80 mg Oral QHS  . Chlorhexidine Gluconate Cloth  6 each Topical Q0600  . feeding supplement (PRO-STAT SUGAR FREE 64)  30 mL Oral BID  . insulin aspart  0-9 Units Subcutaneous Q4H  . levETIRAcetam  500 mg Oral BID  . levothyroxine  200 mcg Oral QAC breakfast  . metoprolol succinate  50 mg Oral Daily   Continuous Infusions:    LOS: 3 days    Time spent: 40 minutes    Irine Seal, MD [facility]   To contact the attending provider between 7A-7P or the covering provider during after hours 7P-7A, please log into the web site [email or web address] and access using universal [place] password for that web site. If you do not have the password, please call the hospital operator.  05/03/2019, 11:53 AM

## 2019-05-04 LAB — CBC WITH DIFFERENTIAL/PLATELET
Abs Immature Granulocytes: 0.04 10*3/uL (ref 0.00–0.07)
Basophils Absolute: 0.1 10*3/uL (ref 0.0–0.1)
Basophils Relative: 1 %
Eosinophils Absolute: 0.1 10*3/uL (ref 0.0–0.5)
Eosinophils Relative: 2 %
HCT: 25.3 % — ABNORMAL LOW (ref 39.0–52.0)
Hemoglobin: 8 g/dL — ABNORMAL LOW (ref 13.0–17.0)
Immature Granulocytes: 1 %
Lymphocytes Relative: 16 %
Lymphs Abs: 1.1 10*3/uL (ref 0.7–4.0)
MCH: 30.8 pg (ref 26.0–34.0)
MCHC: 31.6 g/dL (ref 30.0–36.0)
MCV: 97.3 fL (ref 80.0–100.0)
Monocytes Absolute: 0.5 10*3/uL (ref 0.1–1.0)
Monocytes Relative: 7 %
Neutro Abs: 5.2 10*3/uL (ref 1.7–7.7)
Neutrophils Relative %: 73 %
Platelets: 215 10*3/uL (ref 150–400)
RBC: 2.6 MIL/uL — ABNORMAL LOW (ref 4.22–5.81)
RDW: 17.1 % — ABNORMAL HIGH (ref 11.5–15.5)
WBC: 7.1 10*3/uL (ref 4.0–10.5)
nRBC: 0 % (ref 0.0–0.2)

## 2019-05-04 LAB — RENAL FUNCTION PANEL
Albumin: 2.2 g/dL — ABNORMAL LOW (ref 3.5–5.0)
Anion gap: 15 (ref 5–15)
BUN: 38 mg/dL — ABNORMAL HIGH (ref 8–23)
CO2: 25 mmol/L (ref 22–32)
Calcium: 7.2 mg/dL — ABNORMAL LOW (ref 8.9–10.3)
Chloride: 101 mmol/L (ref 98–111)
Creatinine, Ser: 8.49 mg/dL — ABNORMAL HIGH (ref 0.61–1.24)
GFR calc Af Amer: 7 mL/min — ABNORMAL LOW (ref >60)
GFR calc non Af Amer: 6 mL/min — ABNORMAL LOW (ref >60)
Glucose, Bld: 141 mg/dL — ABNORMAL HIGH (ref 70–99)
Phosphorus: 3 mg/dL (ref 2.5–4.6)
Potassium: 4 mmol/L (ref 3.5–5.1)
Sodium: 141 mmol/L (ref 135–145)

## 2019-05-04 LAB — HEPATITIS B E ANTIBODY: Hep B E Ab: NEGATIVE

## 2019-05-04 LAB — GLUCOSE, CAPILLARY
Glucose-Capillary: 152 mg/dL — ABNORMAL HIGH (ref 70–99)
Glucose-Capillary: 121 mg/dL — ABNORMAL HIGH (ref 70–99)
Glucose-Capillary: 205 mg/dL — ABNORMAL HIGH (ref 70–99)
Glucose-Capillary: 245 mg/dL — ABNORMAL HIGH (ref 70–99)

## 2019-05-04 MED ORDER — MIDODRINE HCL 5 MG PO TABS
5.0000 mg | ORAL_TABLET | Freq: Three times a day (TID) | ORAL | Status: DC
  Filled 2019-05-04: qty 1

## 2019-05-04 MED ORDER — SODIUM CHLORIDE 0.9 % IV SOLN: INTRAVENOUS | Status: AC

## 2019-05-04 MED ORDER — INSULIN ASPART 100 UNIT/ML ~~LOC~~ SOLN
0.0000 [IU] | Freq: Every day | SUBCUTANEOUS | Status: DC
  Administered 2019-05-04: 2 [IU] via SUBCUTANEOUS

## 2019-05-04 MED ORDER — DARBEPOETIN ALFA 100 MCG/0.5ML IJ SOSY
100.0000 ug | PREFILLED_SYRINGE | INTRAMUSCULAR | Status: DC
  Administered 2019-05-06: 100 ug via INTRAVENOUS

## 2019-05-04 MED ORDER — HEPARIN SODIUM (PORCINE) 1000 UNIT/ML IJ SOLN
INTRAMUSCULAR | Status: AC
  Filled 2019-05-04: qty 4

## 2019-05-04 MED ORDER — INSULIN ASPART 100 UNIT/ML ~~LOC~~ SOLN
0.0000 [IU] | Freq: Three times a day (TID) | SUBCUTANEOUS | Status: DC
  Administered 2019-05-04: 3 [IU] via SUBCUTANEOUS
  Administered 2019-05-05: 2 [IU] via SUBCUTANEOUS
  Administered 2019-05-06: 3 [IU] via SUBCUTANEOUS

## 2019-05-04 MED ORDER — ACETAMINOPHEN 500 MG PO TABS
500.0000 mg | ORAL_TABLET | Freq: Three times a day (TID) | ORAL | Status: DC
  Administered 2019-05-04 – 2019-05-07 (×8): 500 mg via ORAL
  Filled 2019-05-04 (×8): qty 1

## 2019-05-04 NOTE — Progress Notes (Signed)
[facility] Progress Note   Subjective:   Patient seen on dialysis. Says he has not passed pill cam yet. Denies SOB, orthopnea, CP, palpitations, dizziness, abdominal, N/V/D.   Objective Vitals:   05/04/19 0655 05/04/19 0701 05/04/19 0730 05/04/19 0800  BP: (!) 118/59 130/64 119/62 (!) 112/57  Pulse: 66 63 66 74  Resp: 14     Temp:      TempSrc: Oral     SpO2: 99%     Weight: 83.5 kg     Height:       Physical Exam General:Well developed, chronically ill appearing male in NAD Head:Normocephalic, atraumatic, L eye ptosis Lungs: Clear bilaterally to auscultation without wheezes, rales, or rhonchi. Breathing is unlabored. Heart:RRR with normal S1, S2. No murmurs, rubs, or gallops appreciated. Abdomen: Soft, non-tender, non-distended with normoactive bowel sounds. No rebound/guarding. No obvious abdominal masses. Lower extremities: No edemabilateral lower extremities Dialysis Access:TDC without surrounding erythema/drainage currently accessed. Maturing LUE AVF + bruit  Additional Objective Labs: Basic Metabolic Panel: Recent Labs  Lab 05/01/19 0726 05/02/19 0556 05/03/19 0402  NA 138 137 139  K 3.6 3.8 3.7  CL 97* 100 100  CO2 25 24 25   GLUCOSE 84 101* 158*  BUN 22 12 23   CREATININE 7.04* 4.26* 6.44*  CALCIUM 7.6* 7.6* 7.7*  PHOS  --   --  2.1*   Liver Function Tests: Recent Labs  Lab 04/29/19 1910 05/03/19 0402  AST 21  --   ALT 11  --   ALKPHOS 85  --   BILITOT 1.3*  --   PROT 5.5*  --   ALBUMIN 2.5* 2.3*    CBC: Recent Labs  Lab 04/29/19 1910 04/29/19 1910 04/30/19 0442 04/30/19 0442 05/01/19 0726 05/01/19 0726 05/01/19 2122 05/01/19 2122 05/02/19 0556 05/02/19 1430 05/03/19 0402  WBC 11.3*   < > 8.1   < > 6.6   < > 7.4  --  6.4  --  7.2  NEUTROABS 9.8*  --   --   --   --   --   --   --   --   --  5.0  HGB 6.6*   < > 8.5*   < > 9.0*   < > 9.4*   < > 8.9* 8.7* 8.8*  HCT 20.3*   < > 25.5*   < > 27.1*   < > 28.6*   < > 27.0*  26.1* 26.7*  MCV 100.5*   < > 92.4  --  92.2  --  93.5  --  93.1  --  94.0  PLT 266   < > 243   < > 240   < > 255  --  235  --  245   < > = values in this interval not displayed.   Blood Culture    Component Value Date/Time   SDES BLOOD RIGHT ANTECUBITAL 04/08/2019 1612   SDES BLOOD RIGHT HAND 04/08/2019 1612   SPECREQUEST  04/08/2019 1612    BOTTLES DRAWN AEROBIC AND ANAEROBIC Blood Culture adequate volume   SPECREQUEST  04/08/2019 1612    BOTTLES DRAWN AEROBIC AND ANAEROBIC Blood Culture adequate volume   CULT  04/08/2019 1612    NO GROWTH 5 DAYS Performed at [facility], [place] [address] [place], [place] [address]    CULT  04/08/2019 1612    NO GROWTH 5 DAYS Performed at [facility], [place] [address], [place], [place] [address]    REPTSTATUS 04/13/2019  FINAL 04/08/2019 1612   REPTSTATUS 04/13/2019 FINAL 04/08/2019 1612    CBG: Recent Labs  Lab 05/02/19 1629 05/02/19 2158 05/03/19 0649 05/03/19 1607 05/03/19 2144  GLUCAP 248* 248* 124* 223* 134*    Studies/Results: DG CHEST PORT 1 VIEW  Result Date: 05/02/2019 CLINICAL DATA:  Cough. EXAM: PORTABLE CHEST 1 VIEW COMPARISON:  04/08/2019 FINDINGS: Mildly enlarged cardiac silhouette without significant change. Clear lungs with normal vascularity. Stable right jugular double-lumen catheter with its tip in the superior vena cava. Thoracic spine degenerative changes. Displaced right posterior 5th rib fracture and additional probable mildly displaced posterior rib fractures on the right. There is also a mildly displaced right lateral 6th rib fracture and additional healed or healing right rib fractures laterally. Old, healed 6th posterior rib fracture thoracic spine degenerative changes. Upper mediastinal surgical clips. Surgical clips at the base of the neck on the right. IMPRESSION: 1. No acute abnormality. 2. Multiple right rib fractures, as described above. 3. Stable mild cardiomegaly. Electronically Signed   By: Claudie Revering M.D.   On: 05/02/2019 15:35   Medications:  . atorvastatin  80 mg Oral QHS  . Chlorhexidine Gluconate Cloth  6 each Topical Q0600  . feeding supplement (PRO-STAT SUGAR FREE 64)  30 mL Oral BID  . insulin aspart  0-5 Units Subcutaneous QHS  . insulin aspart  0-9 Units Subcutaneous TID WC  . levETIRAcetam  500 mg Oral BID  . levothyroxine  200 mcg Oral QAC breakfast  . metoprolol succinate  50 mg Oral Daily    [facility], Time 4 hours, BFR 400/DFR 800, 3K, 2.25Ca, TDC(AVF maturing), EDW 83.5kg (just lowered) Heparin 2000 unit bolus Mircera 100 mcg IV q 2 weeks- last dose 04/22/19 Calcium acetate 667mg  TID AC  Assessment/Plan: 1. GI bleed: Recurrent, had recent admit in 03/2019, EGD during that work up revealed gastritis with peptic ulcer, colonoscopy with single non bleeding angiodysplastic lesion and diverticulosis. Hgb 6.6 on admission, received 2 units PRBC>> stable in the 8's > 8 this AM.Capsule study in progress.On PPI, GI following. 2. ESRD:onMWF.K+ 4.0, using added K+ bath.Plan for HD today per regular schedule. No heparin.  3. Hypertension/volume: BP controlled at present but had orthostatic hypotension earlier in admission. Required a dose of midodrine and 250 mL bolus.  Amlodipine discontinued. Euvolemic on exam, minimal UF with HD tomorrow.  4. Anemia:See #1.ESA due 3/10, will order aranesp with next HD.  5. Metabolic bone disease:Corrected Calcium 8.6.Outpatient binder calcium acetate but phos 2.1> 3.0- hold binder for now. 6. Nutrition:Renal diet with fluid restrictions. Albumin low, continue pro-stat 7. T2DM:insulin per primary 8. 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 Plavix on hold, per primary.  Anice Paganini, PA-C 05/04/2019, 8:03 AM  [facility] Pager: [phone]

## 2019-05-04 NOTE — Progress Notes (Signed)
Patient's capsule endoscopy shows several areas of minimal bleeding, almost like a "rug burn," in the proximal small bowel, without evident underlying lesions such as vascular ectasia or mucosal ulceration.  There were also a couple of submucosal lesions in the proximal small bowel, that look somewhat like lipomas and did not appear to be ulcerated.  There was no significant blood accumulation in the intestine during the 12 hours of the study.  It is unclear whether the capsule endoscopy findings explain this patient's recurrent anemia, but the patient is agreeable to undergoing push enteroscopy tomorrow (nature, purpose, risks reviewed) so we will try to get a better idea as to whether any significant blood loss source is present.`  Kyle [name], M.D. Pager [phone] If no answer or after 5 PM call [phone]

## 2019-05-04 NOTE — Progress Notes (Addendum)
PROGRESS NOTE    Kyle [name]  LXB:[id number] DOB: May 24, 1950 DOA: 04/29/2019 PCP: Idelle Crouch, MD    Brief Narrative:  HPI per Dr. 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  Patient reports he has been seeing dark black-colored stool for several days now. He has been feeling cold but otherwise denies any obvious bleeding including epistaxis, hemoptysis, hematemesis, hematuria, or bright red blood per rectum. He denies any chest pain or dyspnea.  He was recently hospitalized from 04/04/2019-04/16/2019 for GI bleeding. At time of that admission he was on both aspirin and Plavix for his recent stroke. He had an upper endoscopy on 04/05/2019 which showed gastritis and mucosal changes in the duodenum. He underwent colonoscopy on 04/09/2019 which showed a single nonbleeding colonic angiodysplastic lesion which was treated with a monopolar probe. Diverticulosis in the entire examined colon also noted.On discharge he was taken off aspirin and continued on Plavix alone as well as pantoprazole.  ED Course: Initial vitals showed BP 111/62, pulse 82, RR 18, temp 97.8 Fahrenheit, SPO2 97% on room air.  Labs notable for hemoglobin 6.6, WBC 11.3, platelets 266,000, sodium 133, potassium 3.4, bicarb 25, BUN 8, creatinine 3.93, serum glucose 173, PT 12.5, INR 0.9, FOBT positive.  EDP discussed with on-call GI who will see in consultation. Patient was started on IV Protonix infusion. EDP also discussed with on-call nephrology. Patient was ordered to receive 2 unit PRBC transfusion. The  hospitalist service was consulted admit for further evaluation management.    Assessment & Plan:   Principal Problem:   Acute on chronic blood loss anemia Active Problems:   Chronic obstructive pulmonary disease (HCC)   Hyperlipidemia associated with type 2 diabetes mellitus (HCC)   Seizure disorder (HCC)   Insulin dependent type 2 diabetes mellitus (HCC)   ESRD (end stage renal disease) ([place])   Hypertension associated with diabetes ([place])   Hypothyroidism   History of CVA (cerebrovascular accident)   Acute blood loss anemia   Orthostasis   ESRD (end stage renal disease) on dialysis ([place])  1 acute on chronic blood loss anemia/GI bleed Concern for upper GI bleed.  Patient noted to have EGD 04/05/2019 that showed gastritis with duodenal peptic ulcer injury.  Colonoscopy done 04/09/2019 showed a single nonbleeding colonic angiodysplastic lesion status post monopolar probe and diverticulosis throughout the colon.  Patient had presented back with melanotic stools and noted to have a hemoglobin of 6.6 on admission.  Status post 2 units transfusion of packed red blood cells and patient placed on a Protonix drip.  Hemoglobin currently at 8.0.  Patient seen in consultation by GI who are recommending to hold off on endoscopy at this time and feel his bleeding is related to gastritis and duodenal mucosa changes noted on EGD from February 2021 in the setting of medication noncompliance.  Capsule endoscopy study in process. Continue to hold Plavix.  Per GI.  2.  History of left midbrain CVA January 2021, other nonhemorrhagic Patient with residual right-sided weakness and left-sided ptosis with 3rd cranial nerve palsy.  Patient was on both aspirin and Plavix but aspirin discontinued during last admission due to GI bleed and patient placed on Plavix alone.  Patient currently presenting with concerns for ongoing GI bleeding as such Plavix on hold.  GI to recommend when Plavix may be resumed or whether patient  needs to be on low-dose aspirin.  PT/OT.  3.  End-stage renal disease on hemodialysis Monday Wednesday Friday Patient underwent hemodialysis 05/01/2019 without any complications.  Currently in hemodialysis.  Per nephrology.  4.  Orthostatic hypotension Patient noted to have significant orthostasis per OT on 05/02/2019.  Systolic blood pressure went from 106 to 66 and noted to be symptomatic.  Patient still orthostatic today however some improvement.  Patient symptomatic.  Norvasc has been discontinued.  Patient received a 1 dose of midodrine (05/02/2019) in addition to a saline bolus.  H&H stable.  Patient with no further overt bleeding.  TED hose.  May need to be placed on scheduled midodrine.  Follow.   5.  Insulin-dependent diabetes mellitus type 2 Hemoglobin A1c 7.1 on 03/17/2019.  CBG of 121 this morning.  Continue sliding scale insulin.   6.  Hypertension Patient noted to be orthostatic.  Blood pressure borderline this morning.  Norvasc discontinued due to orthostasis.  Patient on Toprol-XL.  May need to be started on midodrine will monitor for now.    7.  Hypothyroidism Continue Synthroid.   8.  Seizure disorder Continue Keppra.    9. Prognosis Patient with multiple medical comorbidities with ESRD, recent CVA, ongoing GI Bleed. Consult with palliative care for goals of care.    DVT prophylaxis: SCDs Code Status: Full Family Communication: Updated patient.  updated wife on telephone. Disposition Plan:  . Patient came from: Skilled nursing facility            . Anticipated d/c place: Skilled nursing facility . Barriers to d/c OR conditions which need to be met to effect a safe d/c: Active GI bleed needing further GI work-up which is in process with capsule endoscopy, H&H needs to be stable, patient on IV PPI, patient also noted to be orthostatic.   Consultants:   Gastroenterology: Dr. Cristina Gong 04/30/2019  Nephrology: Dr. Carolin Sicks 04/30/2019  Procedures:   Capsule  endoscopy  Transfusion of 2 units packed red blood cells   Antimicrobials:   None   Subjective: Patient in hemodialysis.  No chest pain, no shortness of breath.  Capsule endoscopy pending.  Patient denies any further GI bleeding.  Objective: Vitals:   05/04/19 1000 05/04/19 1030 05/04/19 1100 05/04/19 1104  BP: (!) 113/55 (!) 103/57 (!) 95/49 (!) 102/51  Pulse: 89 83 93 76  Resp:    18  Temp:    98.2 F (36.8 C)  TempSrc:    Oral  SpO2:    96%  Weight:    82 kg  Height:        Intake/Output Summary (Last 24 hours) at 05/04/2019 1117 Last data filed at 05/04/2019 1104 Gross per 24 hour  Intake 240 ml  Output 1000 ml  Net -760 ml   Filed Weights   05/03/19 2146 05/04/19 0655 05/04/19 1104  Weight: 83 kg 83.5 kg 82 kg    Examination:  General exam: NAD. Respiratory system: CTAB anterior lung fields.  No wheezes, no crackles, no rhonchi.  Normal respiratory effort.  Cardiovascular system: RRR no murmurs rubs or gallops.  No JVD.  No lower extremity edema.  Gastrointestinal system: Abdomen is nontender, nondistended, soft, positive bowel sounds.  No rebound.  No guarding.  Central nervous system: Alert and oriented.  Left-sided ptosis with cranial nerve III palsy, otherwise no other focal  neurological deficits.  Extremities: Symmetric 5 x 5 power. Skin: No rashes, lesions or ulcers Psychiatry: Judgement and insight appear normal. Mood & affect appropriate.     Data Reviewed: I have personally reviewed following labs and imaging studies  CBC: Recent Labs  Lab 04/29/19 1910 04/30/19 0442 05/01/19 0726 05/01/19 0726 05/01/19 2122 05/02/19 0556 05/02/19 1430 05/03/19 0402 05/04/19 0725  WBC 11.3*   < > 6.6  --  7.4 6.4  --  7.2 7.1  NEUTROABS 9.8*  --   --   --   --   --   --  5.0 5.2  HGB 6.6*   < > 9.0*   < > 9.4* 8.9* 8.7* 8.8* 8.0*  HCT 20.3*   < > 27.1*   < > 28.6* 27.0* 26.1* 26.7* 25.3*  MCV 100.5*   < > 92.2  --  93.5 93.1  --  94.0 97.3  PLT 266   < >  240  --  255 235  --  245 215   < > = values in this interval not displayed.   Basic Metabolic Panel: Recent Labs  Lab 04/30/19 0442 05/01/19 0726 05/02/19 0556 05/03/19 0402 05/04/19 0726  NA 136 138 137 139 141  K 3.3* 3.6 3.8 3.7 4.0  CL 97* 97* 100 100 101  CO2 _0 GLUCOSE 95 84 101* 158* 141*  BUN _1 38*  CREATININE 4.75* 7.04* 4.26* 6.44* 8.49*  CALCIUM 7.3* 7.6* 7.6* 7.7* 7.2*  PHOS  --   --   --  2.1* 3.0   GFR: Estimated Creatinine Clearance: 8.9 mL/min (A) (by C-G formula based on SCr of 8.49 mg/dL (H)). Liver Function Tests: Recent Labs  Lab 04/29/19 1910 05/03/19 0402 05/04/19 0726  AST 21  --   --   ALT 11  --   --   ALKPHOS 85  --   --   BILITOT 1.3*  --   --   PROT 5.5*  --   --   ALBUMIN 2.5* 2.3* 2.2*   No results for input(s): LIPASE, AMYLASE in the last 168 hours. No results for input(s): AMMONIA in the last 168 hours. Coagulation Profile: Recent Labs  Lab 04/29/19 1910  INR 0.9   Cardiac Enzymes: No results for input(s): CKTOTAL, CKMB, CKMBINDEX, TROPONINI in the last 168 hours. BNP (last 3 results) No results for input(s): PROBNP in the last 8760 hours. HbA1C: No results for input(s): HGBA1C in the last 72 hours. CBG: Recent Labs  Lab 05/02/19 2158 05/03/19 0649 05/03/19 1154 05/03/19 1607 05/03/19 2144  GLUCAP 248* 124* 152* 223* 134*   Lipid Profile: No results for input(s): CHOL, HDL, LDLCALC, TRIG, CHOLHDL, LDLDIRECT in the last 72 hours. Thyroid Function Tests: No results for input(s): TSH, T4TOTAL, FREET4, T3FREE, THYROIDAB in the last 72 hours. Anemia Panel: No results for input(s): VITAMINB12, FOLATE, FERRITIN, TIBC, IRON, RETICCTPCT in the last 72 hours. Sepsis Labs: No results for input(s): PROCALCITON, LATICACIDVEN in the last 168 hours.  Recent Results (from the past 240 hour(s))  SARS CORONAVIRUS 2 (TAT 6-24 HRS) Nasopharyngeal Nasopharyngeal Swab     Status: None   Collection Time: 04/30/19  11:48 AM   Specimen: Nasopharyngeal Swab  Result Value Ref Range Status   SARS Coronavirus 2 NEGATIVE NEGATIVE Final    Comment: (NOTE) SARS-CoV-2 target nucleic acids are NOT DETECTED. The SARS-CoV-2 RNA is generally detectable in upper and lower respiratory specimens during the acute phase  of infection. Negative results do not preclude SARS-CoV-2 infection, do not rule out co-infections with other pathogens, and should not be used as the sole basis for treatment or other patient management decisions. Negative results must be combined with clinical observations, patient history, and epidemiological information. The expected result is Negative. Fact Sheet for Patients: SugarRoll.be Fact Sheet for Healthcare Providers: https://www.woods-mathews.com/ This test is not yet approved or cleared by the [place] FDA and  has been authorized for detection and/or diagnosis of SARS-CoV-2 by FDA under an Emergency Use Authorization (EUA). This EUA will remain  in effect (meaning this test can be used) for the duration of the COVID-19 declaration under Section 56 4(b)(1) of the Act, 21 U.S.C. section 360bbb-3(b)(1), unless the authorization is terminated or revoked sooner. Performed at [facility], [place] [address], [place], [place] [address]          Radiology Studies: DG CHEST PORT 1 VIEW  Result Date: 05/02/2019 CLINICAL DATA:  Cough. EXAM: PORTABLE CHEST 1 VIEW COMPARISON:  04/08/2019 FINDINGS: Mildly enlarged cardiac silhouette without significant change. Clear lungs with normal vascularity. Stable right jugular double-lumen catheter with its tip in the superior vena cava. Thoracic spine degenerative changes. Displaced right posterior 5th rib fracture and additional probable mildly displaced posterior rib fractures on the right. There is also a mildly displaced right lateral 6th rib fracture and additional healed or healing right rib  fractures laterally. Old, healed 6th posterior rib fracture thoracic spine degenerative changes. Upper mediastinal surgical clips. Surgical clips at the base of the neck on the right. IMPRESSION: 1. No acute abnormality. 2. Multiple right rib fractures, as described above. 3. Stable mild cardiomegaly. Electronically Signed   By: Claudie Revering M.D.   On: 05/02/2019 15:35        Scheduled Meds: . atorvastatin  80 mg Oral QHS  . Chlorhexidine Gluconate Cloth  6 each Topical Q0600  . [START ON 05/06/2019] darbepoetin (ARANESP) injection - DIALYSIS  100 mcg Intravenous Q Wed-HD  . feeding supplement (PRO-STAT SUGAR FREE 64)  30 mL Oral BID  . heparin      . insulin aspart  0-5 Units Subcutaneous QHS  . insulin aspart  0-9 Units Subcutaneous TID WC  . levETIRAcetam  500 mg Oral BID  . levothyroxine  200 mcg Oral QAC breakfast  . metoprolol succinate  50 mg Oral Daily   Continuous Infusions:    LOS: 4 days    Time spent: 35 minutes    Irine Seal, MD [facility]   To contact the attending provider between 7A-7P or the covering provider during after hours 7P-7A, please log into the web site [email or web address] and access using universal [place] password for that web site. If you do not have the password, please call the hospital operator.  05/04/2019, 11:17 AM

## 2019-05-04 NOTE — H&P (View-Only) (Signed)
Patient's capsule endoscopy shows several areas of minimal bleeding, almost like a "rug burn," in the proximal small bowel, without evident underlying lesions such as vascular ectasia or mucosal ulceration.  There were also a couple of submucosal lesions in the proximal small bowel, that look somewhat like lipomas and did not appear to be ulcerated.  There was no significant blood accumulation in the intestine during the 12 hours of the study.  It is unclear whether the capsule endoscopy findings explain this patient's recurrent anemia, but the patient is agreeable to undergoing push enteroscopy tomorrow (nature, purpose, risks reviewed) so we will try to get a better idea as to whether any significant blood loss source is present.`  Cleotis Nipper, M.D. Pager [phone] If no answer or after 5 PM call [phone]

## 2019-05-05 ENCOUNTER — Inpatient Hospital Stay (HOSPITAL_COMMUNITY): Payer: Medicare PPO | Admitting: Certified Registered"

## 2019-05-05 ENCOUNTER — Encounter (HOSPITAL_COMMUNITY): Payer: Self-pay | Admitting: Family Medicine

## 2019-05-05 ENCOUNTER — Encounter (HOSPITAL_COMMUNITY)
Admission: EM | Disposition: A | Discharge status: Skilled Nursing Facility | Payer: Self-pay | Source: Skilled Nursing Facility | Attending: Internal Medicine

## 2019-05-05 HISTORY — PX: HOT HEMOSTASIS: SHX5433

## 2019-05-05 HISTORY — PX: ENTEROSCOPY: SHX5533

## 2019-05-05 LAB — GLUCOSE, CAPILLARY
Glucose-Capillary: 108 mg/dL — ABNORMAL HIGH (ref 70–99)
Glucose-Capillary: 149 mg/dL — ABNORMAL HIGH (ref 70–99)
Glucose-Capillary: 164 mg/dL — ABNORMAL HIGH (ref 70–99)
Glucose-Capillary: 141 mg/dL — ABNORMAL HIGH (ref 70–99)

## 2019-05-05 LAB — RENAL FUNCTION PANEL
Albumin: 2.4 g/dL — ABNORMAL LOW (ref 3.5–5.0)
Anion gap: 12 (ref 5–15)
BUN: 16 mg/dL (ref 8–23)
CO2: 26 mmol/L (ref 22–32)
Calcium: 7.5 mg/dL — ABNORMAL LOW (ref 8.9–10.3)
Chloride: 100 mmol/L (ref 98–111)
Creatinine, Ser: 5.46 mg/dL — ABNORMAL HIGH (ref 0.61–1.24)
GFR calc Af Amer: 11 mL/min — ABNORMAL LOW (ref >60)
GFR calc non Af Amer: 10 mL/min — ABNORMAL LOW (ref >60)
Glucose, Bld: 127 mg/dL — ABNORMAL HIGH (ref 70–99)
Phosphorus: 2.7 mg/dL (ref 2.5–4.6)
Potassium: 3.8 mmol/L (ref 3.5–5.1)
Sodium: 138 mmol/L (ref 135–145)

## 2019-05-05 LAB — HEMOGLOBIN AND HEMATOCRIT, BLOOD
HCT: 26.8 % — ABNORMAL LOW (ref 39.0–52.0)
Hemoglobin: 8.6 g/dL — ABNORMAL LOW (ref 13.0–17.0)

## 2019-05-05 LAB — HEPATITIS B SURFACE ANTIBODY,QUALITATIVE: Hep B S Ab: REACTIVE — AB

## 2019-05-05 LAB — MAGNESIUM: Magnesium: 1.4 mg/dL — ABNORMAL LOW (ref 1.7–2.4)

## 2019-05-05 SURGERY — ENTEROSCOPY
Anesthesia: Monitor Anesthesia Care

## 2019-05-05 MED ORDER — CALCIUM ACETATE (PHOS BINDER) 667 MG PO CAPS
1334.0000 mg | ORAL_CAPSULE | Freq: Three times a day (TID) | ORAL | Status: DC
  Administered 2019-05-05 – 2019-05-07 (×4): 1334 mg via ORAL
  Filled 2019-05-05 (×4): qty 2

## 2019-05-05 MED ORDER — CHLORHEXIDINE GLUCONATE CLOTH 2 % EX PADS
6.0000 | MEDICATED_PAD | Freq: Every day | CUTANEOUS | Status: DC
  Administered 2019-05-06 – 2019-05-07 (×2): 6 via TOPICAL

## 2019-05-05 MED ORDER — NICOTINE 14 MG/24HR TD PT24
14.0000 mg | MEDICATED_PATCH | Freq: Every day | TRANSDERMAL | Status: DC
  Administered 2019-05-05 – 2019-05-07 (×3): 14 mg via TRANSDERMAL
  Filled 2019-05-05 (×3): qty 1

## 2019-05-05 MED ORDER — LIDOCAINE HCL (CARDIAC) PF 100 MG/5ML IV SOSY
PREFILLED_SYRINGE | INTRAVENOUS | Status: DC | PRN
  Administered 2019-05-05: 40 mg via INTRATRACHEAL

## 2019-05-05 MED ORDER — POLYETHYLENE GLYCOL 3350 17 G PO PACK
17.0000 g | PACK | Freq: Every day | ORAL | Status: DC
  Administered 2019-05-05 – 2019-05-06 (×2): 17 g via ORAL
  Filled 2019-05-05 (×3): qty 1

## 2019-05-05 MED ORDER — NEPRO/CARBSTEADY PO LIQD: 237.0000 mL | Freq: Two times a day (BID) | ORAL | Status: DC

## 2019-05-05 MED ORDER — CLOPIDOGREL BISULFATE 75 MG PO TABS
75.0000 mg | ORAL_TABLET | Freq: Every day | ORAL | Status: DC
  Administered 2019-05-05 – 2019-05-06 (×2): 75 mg via ORAL
  Filled 2019-05-05 (×3): qty 1

## 2019-05-05 MED ORDER — MAGNESIUM SULFATE 4 GM/100ML IV SOLN
4.0000 g | Freq: Once | INTRAVENOUS | Status: AC
  Administered 2019-05-05: 4 g via INTRAVENOUS
  Filled 2019-05-05: qty 100

## 2019-05-05 MED ORDER — PROPOFOL 500 MG/50ML IV EMUL
INTRAVENOUS | Status: DC | PRN
  Administered 2019-05-05: 50 ug/kg/min via INTRAVENOUS

## 2019-05-05 MED ORDER — SODIUM CHLORIDE 0.9 % IV SOLN
INTRAVENOUS | Status: DC | PRN
  Administered 2019-05-05: 250 mL via INTRAVENOUS

## 2019-05-05 MED ORDER — RENA-VITE PO TABS
1.0000 | ORAL_TABLET | Freq: Every day | ORAL | Status: DC
  Administered 2019-05-05 – 2019-05-07 (×3): 1 via ORAL
  Filled 2019-05-05 (×4): qty 1

## 2019-05-05 MED ORDER — SODIUM CHLORIDE 0.9 % IV SOLN
INTRAVENOUS | Status: AC | PRN
  Administered 2019-05-05: 500 mL via INTRAVENOUS

## 2019-05-05 MED ORDER — ONDANSETRON HCL 4 MG/2ML IJ SOLN
INTRAMUSCULAR | Status: DC | PRN
  Administered 2019-05-05: 4 mg via INTRAVENOUS

## 2019-05-05 MED ORDER — GENTAMICIN SULFATE 0.1 % EX CREA
1.0000 "application " | TOPICAL_CREAM | Freq: Every day | CUTANEOUS | Status: DC
  Administered 2019-05-06: 1 via TOPICAL
  Filled 2019-05-05: qty 15

## 2019-05-05 MED ORDER — POLYETHYLENE GLYCOL 3350 17 GM/SCOOP PO POWD
17.0000 g | Freq: Every day | ORAL | Status: DC
  Filled 2019-05-05: qty 255

## 2019-05-05 MED ORDER — FERROUS SULFATE 325 (65 FE) MG PO TABS
325.0000 mg | ORAL_TABLET | Freq: Every day | ORAL | Status: DC
  Administered 2019-05-07: 325 mg via ORAL
  Filled 2019-05-05 (×2): qty 1

## 2019-05-05 MED ORDER — PANCRELIPASE (LIP-PROT-AMYL) 36000-114000 UNITS PO CPEP
36000.0000 [IU] | ORAL_CAPSULE | Freq: Three times a day (TID) | ORAL | Status: DC
  Administered 2019-05-05 – 2019-05-07 (×4): 36000 [IU] via ORAL
  Filled 2019-05-05 (×4): qty 1

## 2019-05-05 MED ORDER — MENTHOL (TOPICAL ANALGESIC) 4 % EX GEL: 1.0000 "application " | CUTANEOUS | Status: DC

## 2019-05-05 MED ORDER — PANTOPRAZOLE SODIUM 40 MG PO TBEC
40.0000 mg | DELAYED_RELEASE_TABLET | Freq: Two times a day (BID) | ORAL | Status: DC
  Administered 2019-05-05 – 2019-05-07 (×4): 40 mg via ORAL
  Filled 2019-05-05 (×5): qty 1

## 2019-05-05 NOTE — Anesthesia Postprocedure Evaluation (Signed)
Anesthesia Post Note  Patient: Kyle [name]  Procedure(s) Performed: ENTEROSCOPY (N/A ) HOT HEMOSTASIS (ARGON PLASMA COAGULATION/BICAP) (N/A )     Patient location during evaluation: PACU Anesthesia Type: MAC Level of consciousness: awake and alert Pain management: pain level controlled Vital Signs Assessment: post-procedure vital signs reviewed and stable Respiratory status: spontaneous breathing and respiratory function stable Cardiovascular status: stable Postop Assessment: no apparent nausea or vomiting Anesthetic complications: no    Last Vitals:  Vitals:   05/05/19 1352 05/05/19 1412  BP: (!) 127/107 (!) 109/57  Pulse: 71 68  Resp: 15 14  Temp:  36.6 C  SpO2: 97%     Last Pain:  Vitals:   05/05/19 1412  TempSrc: Oral  PainSc:                  Kyle [name] DANIEL

## 2019-05-05 NOTE — Progress Notes (Signed)
Renal Navigator received notification from CSW/V. Crawford that patient will not be returning to [facility], but has received a bed offer at [facility], which will allow him to discharge to a SNF closer to his home.  Renal Navigator has initiated request with [facility] in order for patient to be transferred from [place] [facility] clinic to [facility] in [place], which is the clinic where he was originally receiving his PD treatments prior to his last hospitalization.  Renal Navigator will follow closely and provide [facility] with all necessary documents requested for transfer and follow closely through discharge.  Alphonzo Cruise, [place] Renal Navigator [phone]

## 2019-05-05 NOTE — Op Note (Signed)
[facility] Patient Name: Kyle [name] Procedure Date : 05/05/2019 MRN: [id number] Attending MD: Ronnette Juniper , MD Date of Birth: [birth year] CSN: [id number] Age: 69 Admit Type: Inpatient Procedure:                Small bowel enteroscopy Indications:              Obscure gastrointestinal bleeding, Gastrointestinal                            occult blood loss, recent EGD(duodenal bulb                            inflammation) and colonoscopy(Small AVM APC)                            performed, capsule endoscopy showed submucosal                            lesions and possible areas of small bleeding Providers:                Ronnette Juniper, MD, Angus Seller, Janeece Agee,                            Technician Referring MD:             [facility] Medicines:                Monitored Anesthesia Care Complications:            No immediate complications. Estimated Blood Loss:     Estimated blood loss: none. Procedure:                Pre-Anesthesia Assessment:                           - Prior to the procedure, a History and Physical                            was performed, and patient medications and                            allergies were reviewed. The patient's tolerance of                            previous anesthesia was also reviewed. The risks                            and benefits of the procedure and the sedation                            options and risks were discussed with the patient.                            All questions were answered, and informed consent                            was obtained. Prior Anticoagulants:  The patient has                            taken Plavix (clopidogrel), last dose was 5 days                            prior to procedure. ASA Grade Assessment: III - A                            patient with severe systemic disease. After                            reviewing the risks and benefits, the patient was   deemed in satisfactory condition to undergo the                            procedure.                           After obtaining informed consent, the endoscope was                            passed under direct vision. Throughout the                            procedure, the patient's blood pressure, pulse, and                            oxygen saturations were monitored continuously. The                            [id number]-Q180 ([id number] Olympus enteroscope was                            introduced through the mouth and advanced to the                            proximal jejunum. The small bowel enteroscopy was                            accomplished without difficulty. The patient                            tolerated the procedure well. Scope In: Scope Out: Findings:      The examined esophagus was normal.      Diffuse moderately erythematous mucosa without bleeding was found in the       cardia, in the gastric fundus, in the gastric body, in the gastric       antrum and at the pylorus.      Diffuse severe mucosal changes characterized by erythema, inflammation,       nodularity and altered texture were found in the duodenal bulb.      A large submucosal mass, possibly a lipoma with no bleeding was found in       the second portion of the duodenum.      A  single angioectasia with no bleeding was found in the second portion       of the duodenum, on the surface of the large submucosal duodenal lesion.       Coagulation for tissue destruction using argon plasma at 1 liter/minute       and 20 watts was successful.      There was no evidence of significant pathology in the entire examined       portion of proximal jejunum.      Extreme looping and spasms caused difficulty with completely the       procedure. There was a large amount of bubbles noted, which was cleared       with lavage.      No active or recent bleeding noted, in the examined portion of duodenum       and proximal jejunum.       The exact extent of enteroscope advancement could not be determined, but       was likely in the proximal jejunum. Impression:               - Normal esophagus.                           - Erythematous mucosa in the cardia, gastric                            fundus, gastric body, antrum and pylorus.                           - Mucosal changes in the duodenum.                           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                           - A single non-bleeding angioectasia in the                            duodenum. Treated with argon plasma coagulation                            (APC).                           - The examined portion of the jejunum was normal.                           - No specimens collected. Moderate Sedation:      Patient did not receive moderate sedation for this procedure, but       instead received monitored anesthesia care. Recommendation:           - Resume regular diet.                           - Patient has ESRD on HD and remains at risk of GI                            AVMs.  As he has had recurrent anemia, please consider                            switching to ASA 81 mg instead of long term plavix                            use.                           Otherwise, ok to resume antiplatelets from tomorrow. Procedure Code(s):        --- Professional ---                           [phone], Small intestinal endoscopy, enteroscopy                            beyond second portion of duodenum, not including                            ileum; with ablation of tumor(s), polyp(s), or                            other lesion(s) not amenable to removal by hot                            biopsy forceps, bipolar cautery or snare technique Diagnosis Code(s):        --- Professional ---                           K31.89, Other diseases of stomach and duodenum                           K31.819, Angiodysplasia of stomach and duodenum                             without bleeding                           K92.2, Gastrointestinal hemorrhage, unspecified                           R19.5, Other fecal abnormalities CPT copyright 2019 American Medical Association. All rights reserved. The codes documented in this report are preliminary and upon coder review may  be revised to meet current compliance requirements. Ronnette Juniper, MD 05/05/2019 1:48:01 PM This report has been signed electronically. Number of Addenda: 0

## 2019-05-05 NOTE — Anesthesia Procedure Notes (Signed)
Procedure Name: MAC Date/Time: 05/05/2019 1:26 PM Performed by: Lavell Luster, CRNA Pre-anesthesia Checklist: Patient identified, Emergency Drugs available, Suction available, Patient being monitored and Timeout performed Patient Re-evaluated:Patient Re-evaluated prior to induction Oxygen Delivery Method: Simple face mask Preoxygenation: Pre-oxygenation with 100% oxygen Induction Type: IV induction Placement Confirmation: breath sounds checked- equal and bilateral and positive ETCO2 Dental Injury: Teeth and Oropharynx as per pre-operative assessment

## 2019-05-05 NOTE — Progress Notes (Signed)
OT Cancellation Note  Patient Details Name: Kyle [name] MRN: [id number] DOB: 29-Jan-1951   Cancelled Treatment:    Reason Eval/Treat Not Completed: Patient declined, no reason specified. OT will try again next available day/time  Britt Bottom 05/05/2019, 11:16 AM

## 2019-05-05 NOTE — Progress Notes (Signed)
PT is refusing orthostatic vitals. RN notified

## 2019-05-05 NOTE — Brief Op Note (Signed)
04/29/2019 - 05/05/2019  1:36 PM  PATIENT:  Kyle [name]  69 y.o. male  PRE-OPERATIVE DIAGNOSIS:  Melena, anemia  POST-OPERATIVE DIAGNOSIS:  * No post-op diagnosis entered *  PROCEDURE:  Procedure(s) with comments: ENTEROSCOPY (N/A) - Push enteroscopy HOT HEMOSTASIS (ARGON PLASMA COAGULATION/BICAP) (N/A)  SURGEON:  Surgeon(s) and Role:    Ronnette Juniper, MD - Primary  PHYSICIAN ASSISTANT:   ASSISTANTS:Shanice Odis Luster , Tech  ANESTHESIA:   MAC  EBL:  None  BLOOD ADMINISTERED:none  DRAINS: none   LOCAL MEDICATIONS USED:  NONE  SPECIMEN:  No Specimen  DISPOSITION OF SPECIMEN:  N/A  COUNTS:  YES  TOURNIQUET:  * No tourniquets in log *  DICTATION: .Dragon Dictation  PLAN OF CARE: Admit to inpatient   PATIENT DISPOSITION:  PACU - hemodynamically stable.   Delay start of Pharmacological VTE agent (>24hrs) due to surgical blood loss or risk of bleeding: yes

## 2019-05-05 NOTE — Progress Notes (Signed)
PT Cancellation Note  Patient Details Name: Brixon Zhen MRN: [id number] DOB: [birth year]   Cancelled Treatment:    Reason Eval/Treat Not Completed: Patient at procedure or test/unavailable.  Leaving for endoscopy when PT arrived and will re-attempt as time and pt allow.   Ramond Dial 05/05/2019, 12:22 PM   Mee Hives, PT MS Acute Rehab Dept. Number: [place] [facility] [place]

## 2019-05-05 NOTE — TOC Progression Note (Signed)
Transition of Care [facility]) - Progression Note    Patient Details  Name: Kyle [name] MRN: [id number] Date of Birth: 1950/10/24  Transition of Care [facility]) CM/SW Contact  Sharlet Salina Mila Homer, LCSW Phone Number: 05/05/2019, 6:21 PM  Clinical Narrative:  Talked with wife regarding discharge disposition for patient and was informed that [facility] made a bed offer for ST rehab. Wife confirmed that she does not want her husband to return to [facility] and was agreeable to him discharging to [facility]. She added that her husband's HD Center is in [place] and was changed when he was going to [place] [facility] [facility].  Talked with renal navigator Terri Piedra regarding patient and confirmed with [facility] that any MWF or TTS ok, however they would like an early HD schedule.   Expected Discharge Plan: [place] Barriers to Discharge: Continued Medical Work up, Ship broker  Expected Discharge Plan and Services Expected Discharge Plan: [place]       Living arrangements for the past 2 months: Skilled Nursing Facility([facility])                                       Social Determinants of Health (SDOH) Interventions    Readmission Risk Interventions No flowsheet data found.

## 2019-05-05 NOTE — Progress Notes (Signed)
PT IS REFUSING ORTHOSTATIC VITALS AGAIN

## 2019-05-05 NOTE — Plan of Care (Signed)
  Problem: Education: Goal: Knowledge of General Education information will improve Description: Including pain rating scale, medication(s)/side effects and non-pharmacologic comfort measures Outcome: Completed/Met   Problem: Health Behavior/Discharge Planning: Goal: Ability to manage health-related needs will improve Outcome: Completed/Met   Problem: Clinical Measurements: Goal: Ability to maintain clinical measurements within normal limits will improve Outcome: Completed/Met Goal: Diagnostic test results will improve Outcome: Completed/Met Goal: Respiratory complications will improve Outcome: Completed/Met Goal: Cardiovascular complication will be avoided Outcome: Completed/Met   Problem: Nutrition: Goal: Adequate nutrition will be maintained Outcome: Completed/Met   Problem: Elimination: Goal: Will not experience complications related to bowel motility Outcome: Completed/Met Goal: Will not experience complications related to urinary retention Outcome: Completed/Met   Problem: Pain Managment: Goal: General experience of comfort will improve Outcome: Completed/Met   Problem: Education: Goal: Knowledge of disease and its progression will improve Outcome: Completed/Met Goal: Individualized Educational Video(s) Outcome: Completed/Met   Problem: Fluid Volume: Goal: Compliance with measures to maintain balanced fluid volume will improve Outcome: Completed/Met   Problem: Health Behavior/Discharge Planning: Goal: Ability to manage health-related needs will improve Outcome: Completed/Met   Problem: Nutritional: Goal: Ability to make healthy dietary choices will improve Outcome: Completed/Met   Problem: Clinical Measurements: Goal: Complications related to the disease process, condition or treatment will be avoided or minimized Outcome: Completed/Met

## 2019-05-05 NOTE — Progress Notes (Signed)
Patient refused to be transferred to low bed.

## 2019-05-05 NOTE — Anesthesia Preprocedure Evaluation (Signed)
Anesthesia Evaluation  Patient identified by MRN, date of birth, ID band Patient awake    Reviewed: Allergy & Precautions, NPO status , Patient's Chart, lab work & pertinent test results  History of Anesthesia Complications Negative for: history of anesthetic complications  Airway Mallampati: II  TM Distance: >3 FB Neck ROM: Full    Dental   Pulmonary COPD, Current Smoker and Patient abstained from smoking.,    Pulmonary exam normal        Cardiovascular hypertension, + Peripheral Vascular Disease  Normal cardiovascular exam     Neuro/Psych Seizures -,  CVA (Jan 2021), Residual Symptoms negative psych ROS   GI/Hepatic Neg liver ROS, GI bleed   Endo/Other  diabetes, Insulin DependentHypothyroidism   Renal/GU ESRFRenal disease  negative genitourinary   Musculoskeletal negative musculoskeletal ROS (+)   Abdominal   Peds  Hematology  (+) anemia ,   Anesthesia Other Findings   Reproductive/Obstetrics                             Anesthesia Physical Anesthesia Plan  ASA: IV  Anesthesia Plan: MAC   Post-op Pain Management:    Induction: Intravenous  PONV Risk Score and Plan: Propofol infusion, TIVA and Treatment may vary due to age or medical condition  Airway Management Planned: Natural Airway, Nasal Cannula and Simple Face Mask  Additional Equipment: None  Intra-op Plan:   Post-operative Plan:   Informed Consent: I have reviewed the patients History and Physical, chart, labs and discussed the procedure including the risks, benefits and alternatives for the proposed anesthesia with the patient or authorized representative who has indicated his/her understanding and acceptance.       Plan Discussed with:   Anesthesia Plan Comments:         Anesthesia Quick Evaluation

## 2019-05-05 NOTE — Op Note (Signed)
Small bowel push enteroscopy was performed for abnormal capsule endoscopy. Patient recently had an EGD which showed nodular duodenal bulb and a colonoscopy with APC of small AVM.   Findings: Normal-appearing esophagus. Erythematous mucosa in cardia, fundus, body, antrum and pylorus. Nodular and inflamed mucosa noted in duodenal bulb.  Large submucosal mass, possibly a lipoma found in second portion of the duodenum. A single nonbleeding AVM found on the surface of the large submucosal lesion, treated with APC.  Significant looping and intestinal spasms was attempted. Examined portion of the proximal jejunum appeared unremarkable. There was no evidence of recent or active bleeding.   Recommendations: Resume diet. Patient was on Plavix and has had recent and recurrent GI bleed and anemia. He has end-stage renal disease is on hemodialysis and may have further AVMs in the GI tract, with increased risk of recurrent bleed in the future. With his history of stroke, we need to consider whether patient can be kept on aspirin and if Plavix can be discontinued.  Otherwise, if needed antiplatelets can be resumed from tomorrow.  GI will sign off, please recall as needed.   Ronnette Juniper, MD

## 2019-05-05 NOTE — Progress Notes (Signed)
Patient brother Dominica Severin was concerned regarding pain management as pt has broken ribs, informed him that patient is on tramadol and tylenol prn and also tylenol scheduled 3 times daily, also informed him that patient was off the floor for enteroscopy procedure and after that he has been sleepy on and off and did not verbalize any pain, will continue to monitor.

## 2019-05-05 NOTE — Transfer of Care (Signed)
Immediate Anesthesia Transfer of Care Note  Patient: Kyle [name]  Procedure(s) Performed: ENTEROSCOPY (N/A ) HOT HEMOSTASIS (ARGON PLASMA COAGULATION/BICAP) (N/A )  Patient Location: Endoscopy Unit  Anesthesia Type:MAC  Level of Consciousness: sedated  Airway & Oxygen Therapy: Patient connected to nasal cannula oxygen  Post-op Assessment: Post -op Vital signs reviewed and stable  Post vital signs: stable  Last Vitals:  Vitals Value Taken Time  BP 103/85 05/05/19 1342  Temp    Pulse 70 05/05/19 1342  Resp 18 05/05/19 1342  SpO2 100 % 05/05/19 1342  Vitals shown include unvalidated device data.  Last Pain:  Vitals:   05/05/19 1230  TempSrc: Oral  PainSc: 0-No pain         Complications: No apparent anesthesia complications

## 2019-05-05 NOTE — Progress Notes (Addendum)
PROGRESS NOTE    Kyle [name]  TTS:[id number] DOB: 26-Jan-1951 DOA: 04/29/2019 PCP: Idelle Crouch, MD    Brief Narrative:  HPI per Dr. 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  Patient reports he has been seeing dark black-colored stool for several days now. He has been feeling cold but otherwise denies any obvious bleeding including epistaxis, hemoptysis, hematemesis, hematuria, or bright red blood per rectum. He denies any chest pain or dyspnea.  He was recently hospitalized from 04/04/2019-04/16/2019 for GI bleeding. At time of that admission he was on both aspirin and Plavix for his recent stroke. He had an upper endoscopy on 04/05/2019 which showed gastritis and mucosal changes in the duodenum. He underwent colonoscopy on 04/09/2019 which showed a single nonbleeding colonic angiodysplastic lesion which was treated with a monopolar probe. Diverticulosis in the entire examined colon also noted.On discharge he was taken off aspirin and continued on Plavix alone as well as pantoprazole.  ED Course: Initial vitals showed BP 111/62, pulse 82, RR 18, temp 97.8 Fahrenheit, SPO2 97% on room air.  Labs notable for hemoglobin 6.6, WBC 11.3, platelets 266,000, sodium 133, potassium 3.4, bicarb 25, BUN 8, creatinine 3.93, serum glucose 173, PT 12.5, INR 0.9, FOBT positive.  EDP discussed with on-call GI who will see in consultation. Patient was started on IV Protonix infusion. EDP also discussed with on-call nephrology. Patient was ordered to receive 2 unit PRBC transfusion. The  hospitalist service was consulted admit for further evaluation management.    Assessment & Plan:   Principal Problem:   Acute on chronic blood loss anemia Active Problems:   Chronic obstructive pulmonary disease (HCC)   Hyperlipidemia associated with type 2 diabetes mellitus (HCC)   Seizure disorder (HCC)   Insulin dependent type 2 diabetes mellitus (HCC)   ESRD (end stage renal disease) ([place])   Hypertension associated with diabetes ([place])   Hypothyroidism   History of CVA (cerebrovascular accident)   Acute blood loss anemia   Orthostasis   ESRD (end stage renal disease) on dialysis ([place])  1 acute on chronic blood loss anemia/GI bleed Concern for upper GI bleed.  Patient noted to have EGD 04/05/2019 that showed gastritis with duodenal peptic ulcer injury.  Colonoscopy done 04/09/2019 showed a single nonbleeding colonic angiodysplastic lesion status post monopolar probe and diverticulosis throughout the colon.  Patient had presented back with melanotic stools and noted to have a hemoglobin of 6.6 on admission.  Status post 2 units transfusion of packed red blood cells and patient placed on a Protonix drip.  Hemoglobin currently at 8.6.  Patient seen in consultation by GI who are recommending to hold off on endoscopy at this time and feel his bleeding is related to gastritis and duodenal mucosa changes noted on EGD from February 2021 in the setting of medication noncompliance.  Capsule endoscopy study done and per GI note showed several areas of minimal bleeding almost like a "rug burn "in the proximal small bowel without evident underlying lesion such as a vascular ectasia or mucosal ulceration.  Also couple of submucosal lesions in the proximal small bowel that look somewhat like lipomas and did not appear to be ulcerated.  No significant  blood accumulation in the intestine during the 12 hours of the study.  GI recommending push enteroscopy today.  Continue to hold Plavix.  Per GI.   2.  History  of left midbrain CVA January 2021, other nonhemorrhagic Patient with residual right-sided weakness and left-sided ptosis with 3rd cranial nerve palsy.  Patient was on both aspirin and Plavix but aspirin discontinued during last admission due to GI bleed and patient placed on Plavix alone.  Patient currently presenting with concerns for ongoing GI bleeding as such Plavix on hold.  GI to recommend when Plavix may be resumed or whether patient needs to be on low-dose aspirin.  PT/OT.  3.  End-stage renal disease on hemodialysis Monday Wednesday Friday Patient has been assessed by nephrology and has been receiving hemodialysis during this hospitalization.  Per nephrology.   4.  Orthostatic hypotension Patient noted to have significant orthostasis per OT on 05/02/2019.  Systolic blood pressure went from 106 to 66 and noted to be symptomatic.  Patient still orthostatic as of 05/04/2019.  Patient with complaints of dizziness.  Patient refusing orthostatics to be checked this morning x3.  Norvasc has been discontinued.  Patient received a 1 dose of midodrine (05/02/2019) in addition to a saline bolus.  H&H stable.  Patient with no further overt bleeding.  TED hose.  Patient placed on gentle hydration on 05/04/2019 of normal saline 85 cc/h x 12 hours which has been completed.  Patient still with complaints of dizziness however refusing orthostatics to be checked.  Repeat orthostatics in the morning.  Follow.   5.  Insulin-dependent diabetes mellitus type 2 Hemoglobin A1c 7.1 on 03/17/2019.  CBG of 108 this morning.  Continue sliding scale insulin.   6.  Hypertension Patient noted to be orthostatic.  Blood pressure improved.  Norvasc discontinued due to orthostasis.  Patient on Toprol-XL.  Patient placed on gentle hydration x12 hours yest undisturbed erday after discussion with nephrology for orthostasis.  Follow.  7.  Hypothyroidism Synthroid.   8.  Seizure disorder Continue Keppra.  9.  Hypomagnesemia Magnesium  sulfate 4 g IV x1.  Repeat labs in the morning.  10. Prognosis Patient with multiple medical comorbidities with ESRD, recent CVA, ongoing GI Bleed.  Palliative care consulted for goals of care.    DVT prophylaxis: SCDs Code Status: Full Family Communication: Updated patient.  Disposition Plan:  . Patient came from: Skilled nursing facility            . Anticipated d/c place: Skilled nursing facility . Barriers to d/c OR conditions which need to be met to effect a safe d/c: Active GI bleed needing further GI work-up which is in process with enteroscopy, H&H needs to be stable, patient on IV PPI, patient also noted to be orthostatic.   Consultants:   Gastroenterology: Dr. Cristina Gong 04/30/2019  Nephrology: Dr. Carolin Sicks 04/30/2019  Palliative care pending  Procedures:   Capsule endoscopy  Transfusion of 2 units packed red blood cells   Antimicrobials:   None   Subjective: Patient laying in bed.  Denies chest pain or shortness of breath.  Denies any bleeding.  Still with complaints of dizziness.  Denies any nausea or vomiting.  Patient noted to have refused orthostatic vitals this morning x3.   Objective: Vitals:   05/04/19 1104 05/04/19 1150 05/05/19 0418 05/05/19 0824  BP: (!) 102/51 (!) 98/51 131/61 (!) 132/56  Pulse: 76 80 68 72  Resp: '18 18 18 18  '$ Temp: 98.2 F (36.8 C) 98 F (36.7 C) (!)  97.5 F (36.4 C) 98.5 F (36.9 C)  TempSrc: Oral Oral Oral Oral  SpO2: 96% 99% 100% 97%  Weight: 82 kg     Height:        Intake/Output Summary (Last 24 hours) at 05/05/2019 1136 Last data filed at 05/05/2019 1128 Gross per 24 hour  Intake 1401.98 ml  Output 0 ml  Net 1401.98 ml   Filed Weights   05/03/19 2146 05/04/19 0655 05/04/19 1104  Weight: 83 kg 83.5 kg 82 kg    Examination:  General exam: NAD. Respiratory system: Lungs clear to auscultation bilaterally.  No wheezes, no crackles, no rhonchi.  Normal respiratory effort. Cardiovascular system: Regular rate rhythm no  murmurs rubs or gallops.  No JVD.  No lower extremity edema. Gastrointestinal system: Abdomen is soft, nontender, nondistended, positive bowel sounds.  No rebound.  No guarding.  Central nervous system: Alert and oriented.  Left-sided ptosis with cranial nerve III palsy, otherwise no other focal neurological deficits.  Extremities: Symmetric 5 x 5 power. Skin: No rashes, lesions or ulcers Psychiatry: Judgement and insight appear normal. Mood & affect appropriate.     Data Reviewed: I have personally reviewed following labs and imaging studies  CBC: Recent Labs  Lab 04/29/19 1910 04/30/19 0442 05/01/19 0726 05/01/19 0726 05/01/19 2122 05/01/19 2122 05/02/19 0556 05/02/19 1430 05/03/19 0402 05/04/19 0725 05/05/19 0718  WBC 11.3*   < > 6.6  --  7.4  --  6.4  --  7.2 7.1  --   NEUTROABS 9.8*  --   --   --   --   --   --   --  5.0 5.2  --   HGB 6.6*   < > 9.0*   < > 9.4*   < > 8.9* 8.7* 8.8* 8.0* 8.6*  HCT 20.3*   < > 27.1*   < > 28.6*   < > 27.0* 26.1* 26.7* 25.3* 26.8*  MCV 100.5*   < > 92.2  --  93.5  --  93.1  --  94.0 97.3  --   PLT 266   < > 240  --  255  --  235  --  245 215  --    < > = values in this interval not displayed.   Basic Metabolic Panel: Recent Labs  Lab 05/01/19 0726 05/02/19 0556 05/03/19 0402 05/04/19 0726 05/05/19 0718  NA 138 137 139 141 138  K 3.6 3.8 3.7 4.0 3.8  CL 97* 100 100 101 100  CO2 '25 24 25 25 26  '$ GLUCOSE 84 101* 158* 141* 127*  BUN '22 12 23 '$ 38* 16  CREATININE 7.04* 4.26* 6.44* 8.49* 5.46*  CALCIUM 7.6* 7.6* 7.7* 7.2* 7.5*  MG  --   --   --   --  1.4*  PHOS  --   --  2.1* 3.0 2.7   GFR: Estimated Creatinine Clearance: 13.8 mL/min (A) (by C-G formula based on SCr of 5.46 mg/dL (H)). Liver Function Tests: Recent Labs  Lab 04/29/19 1910 05/03/19 0402 05/04/19 0726 05/05/19 0718  AST 21  --   --   --   ALT 11  --   --   --   ALKPHOS 85  --   --   --   BILITOT 1.3*  --   --   --   PROT 5.5*  --   --   --   ALBUMIN 2.5* 2.3*  2.2* 2.4*   No results for input(s): LIPASE, AMYLASE  in the last 168 hours. No results for input(s): AMMONIA in the last 168 hours. Coagulation Profile: Recent Labs  Lab 04/29/19 1910  INR 0.9   Cardiac Enzymes: No results for input(s): CKTOTAL, CKMB, CKMBINDEX, TROPONINI in the last 168 hours. BNP (last 3 results) No results for input(s): PROBNP in the last 8760 hours. HbA1C: No results for input(s): HGBA1C in the last 72 hours. CBG: Recent Labs  Lab 05/04/19 1145 05/04/19 1632 05/04/19 2149 05/05/19 0706 05/05/19 1111  GLUCAP 121* 205* 245* 108* 149*   Lipid Profile: No results for input(s): CHOL, HDL, LDLCALC, TRIG, CHOLHDL, LDLDIRECT in the last 72 hours. Thyroid Function Tests: No results for input(s): TSH, T4TOTAL, FREET4, T3FREE, THYROIDAB in the last 72 hours. Anemia Panel: No results for input(s): VITAMINB12, FOLATE, FERRITIN, TIBC, IRON, RETICCTPCT in the last 72 hours. Sepsis Labs: No results for input(s): PROCALCITON, LATICACIDVEN in the last 168 hours.  Recent Results (from the past 240 hour(s))  SARS CORONAVIRUS 2 (TAT 6-24 HRS) Nasopharyngeal Nasopharyngeal Swab     Status: None   Collection Time: 04/30/19 11:48 AM   Specimen: Nasopharyngeal Swab  Result Value Ref Range Status   SARS Coronavirus 2 NEGATIVE NEGATIVE Final    Comment: (NOTE) SARS-CoV-2 target nucleic acids are NOT DETECTED. The SARS-CoV-2 RNA is generally detectable in upper and lower respiratory specimens during the acute phase of infection. Negative results do not preclude SARS-CoV-2 infection, do not rule out co-infections with other pathogens, and should not be used as the sole basis for treatment or other patient management decisions. Negative results must be combined with clinical observations, patient history, and epidemiological information. The expected result is Negative. Fact Sheet for Patients: SugarRoll.be Fact Sheet for Healthcare  Providers: https://www.woods-mathews.com/ This test is not yet approved or cleared by the [place] FDA and  has been authorized for detection and/or diagnosis of SARS-CoV-2 by FDA under an Emergency Use Authorization (EUA). This EUA will remain  in effect (meaning this test can be used) for the duration of the COVID-19 declaration under Section 56 4(b)(1) of the Act, 21 U.S.C. section 360bbb-3(b)(1), unless the authorization is terminated or revoked sooner. Performed at [facility], [place] [address], [place], [place] [address]          Radiology Studies: No results found.      Scheduled Meds: . acetaminophen  500 mg Oral TID  . atorvastatin  80 mg Oral QHS  . Chlorhexidine Gluconate Cloth  6 each Topical Q0600  . [START ON 05/06/2019] darbepoetin (ARANESP) injection - DIALYSIS  100 mcg Intravenous Q Wed-HD  . feeding supplement (PRO-STAT SUGAR FREE 64)  30 mL Oral BID  . insulin aspart  0-5 Units Subcutaneous QHS  . insulin aspart  0-9 Units Subcutaneous TID WC  . levETIRAcetam  500 mg Oral BID  . levothyroxine  200 mcg Oral QAC breakfast  . metoprolol succinate  50 mg Oral Daily   Continuous Infusions: . sodium chloride 250 mL (05/05/19 1015)  . magnesium sulfate bolus IVPB 4 g (05/05/19 1017)     LOS: 5 days    Time spent: 35 minutes    Irine Seal, MD [facility]   To contact the attending provider between 7A-7P or the covering provider during after hours 7P-7A, please log into the web site [email or web address] and access using universal [place] password for that web site. If you do not have the password, please call the hospital operator.  05/05/2019, 11:36 AM

## 2019-05-05 NOTE — Interval H&P Note (Signed)
History and Physical Interval Note: 68/male for a push enteroscopy for abnormal capsule endoscopy and history of GI bleeding.  05/05/2019 12:55 PM  [name]  has presented today for push enteroscopy, with the diagnosis of Melena, anemia.  The various methods of treatment have been discussed with the patient and family. After consideration of risks, benefits and other options for treatment, the patient has consented to  Procedure(s) with comments: ENTEROSCOPY (N/A) - Push enteroscopy as a surgical intervention.  The patient's history has been reviewed, patient examined, no change in status, stable for surgery.  I have reviewed the patient's chart and labs.  Questions were answered to the patient's satisfaction.     Ronnette Juniper

## 2019-05-05 NOTE — Progress Notes (Signed)
Kyle [name] Progress Note   Subjective:   Patient seen in room. Reports he is tired this AM. Denies SOB, CP, palpitations, dizziness, abdominal pain, N/V/D.  Objective Vitals:   05/04/19 1104 05/04/19 1150 05/05/19 0418 05/05/19 0824  BP: (!) 102/51 (!) 98/51 131/61 (!) 132/56  Pulse: 76 80 68 72  Resp: 18 18 18 18   Temp: 98.2 F (36.8 C) 98 F (36.7 C) (!) 97.5 F (36.4 C) 98.5 F (36.9 C)  TempSrc: Oral Oral Oral Oral  SpO2: 96% 99% 100% 97%  Weight: 82 kg     Height:       Physical Exam General:Well developed, chronically ill appearing male in NAD Head:Normocephalic, atraumatic, L eye ptosis Lungs: Clear bilaterally to auscultation without wheezes, rales, or rhonchi.  Heart:RRR with normal S1, S2. No murmurs, rubs, or gallops appreciated. Abdomen: Soft, non-tender, non-distended with normoactive bowel sounds. Lower extremities: No edemabilateral lower extremities Dialysis Access:TDC without surrounding erythema/drainage. Maturing LUE AVF + bruit  Additional Objective Labs: Basic Metabolic Panel: Recent Labs  Lab 05/03/19 0402 05/04/19 0726 05/05/19 0718  NA 139 141 138  K 3.7 4.0 3.8  CL 100 101 100  CO2 25 25 26   GLUCOSE 158* 141* 127*  BUN 23 38* 16  CREATININE 6.44* 8.49* 5.46*  CALCIUM 7.7* 7.2* 7.5*  PHOS 2.1* 3.0 2.7   Liver Function Tests: Recent Labs  Lab 04/29/19 1910 04/29/19 1910 05/03/19 0402 05/04/19 0726 05/05/19 0718  AST 21  --   --   --   --   ALT 11  --   --   --   --   ALKPHOS 85  --   --   --   --   BILITOT 1.3*  --   --   --   --   PROT 5.5*  --   --   --   --   ALBUMIN 2.5*   < > 2.3* 2.2* 2.4*   < > = values in this interval not displayed.   No results for input(s): LIPASE, AMYLASE in the last 168 hours. CBC: Recent Labs  Lab 04/29/19 1910 04/30/19 0442 05/01/19 0726 05/01/19 0726 05/01/19 2122 05/01/19 2122 05/02/19 0556 05/02/19 1430 05/03/19 0402 05/04/19 0725 05/05/19 0718  WBC 11.3*   <  > 6.6   < > 7.4   < > 6.4  --  7.2 7.1  --   NEUTROABS 9.8*  --   --   --   --   --   --   --  5.0 5.2  --   HGB 6.6*   < > 9.0*   < > 9.4*   < > 8.9*   < > 8.8* 8.0* 8.6*  HCT 20.3*   < > 27.1*   < > 28.6*   < > 27.0*   < > 26.7* 25.3* 26.8*  MCV 100.5*   < > 92.2  --  93.5  --  93.1  --  94.0 97.3  --   PLT 266   < > 240   < > 255   < > 235  --  245 215  --    < > = values in this interval not displayed.   Blood Culture    Component Value Date/Time   SDES BLOOD RIGHT ANTECUBITAL 04/08/2019 1612   SDES BLOOD RIGHT HAND 04/08/2019 1612   SPECREQUEST  04/08/2019 1612    BOTTLES DRAWN AEROBIC AND ANAEROBIC Blood Culture adequate volume   SPECREQUEST  04/08/2019 1612    BOTTLES DRAWN AEROBIC AND ANAEROBIC Blood Culture adequate volume   CULT  04/08/2019 1612    NO GROWTH 5 DAYS Performed at [facility], [place] [address], [place], [place] [address]    CULT  04/08/2019 1612    NO GROWTH 5 DAYS Performed at [facility], [place] [address], [place], [place] [address]    REPTSTATUS 04/13/2019 FINAL 04/08/2019 1612   REPTSTATUS 04/13/2019 FINAL 04/08/2019 1612   CBG: Recent Labs  Lab 05/03/19 2144 05/04/19 1145 05/04/19 1632 05/04/19 2149 05/05/19 0706  GLUCAP 134* 121* 205* 245* 108*   Medications: . sodium chloride 250 mL (05/05/19 1015)  . magnesium sulfate bolus IVPB 4 g (05/05/19 1017)   . acetaminophen  500 mg Oral TID  . atorvastatin  80 mg Oral QHS  . Chlorhexidine Gluconate Cloth  6 each Topical Q0600  . [START ON 05/06/2019] darbepoetin (ARANESP) injection - DIALYSIS  100 mcg Intravenous Q Wed-HD  . feeding supplement (PRO-STAT SUGAR FREE 64)  30 mL Oral BID  . insulin aspart  0-5 Units Subcutaneous QHS  . insulin aspart  0-9 Units Subcutaneous TID WC  . levETIRAcetam  500 mg Oral BID  . levothyroxine  200 mcg Oral QAC breakfast  . metoprolol succinate  50 mg Oral Daily    [facility] Time 4  hours, BFR 400/DFR 800, 3K, 2.25Ca, TDC(AVF maturing), EDW 83.5kg (just lowered) Heparin 2000 unit bolus Mircera 100 mcg IV q 2 weeks- last dose 04/22/19 Calcium acetate 667mg  TID AC  Assessment/Plan: 1. GI bleed: Recurrent, had recent admit in 03/2019, EGD during that work up revealed gastritis with peptic ulcer, colonoscopy with single non bleeding angiodysplastic lesion and diverticulosis. Hgb 6.6 on admission, received 2 units PRBC>> stable in the 8's .Capsule study showed multiple areas of minimal bleeding, plan for enteroscopy per GI.  2. ESRD:onMWF.K+ 3.8, using added K+ bath.Plan for HD 3/10 per regular schedule. No heparin.  3. Hypertension/volume:BP controlled at present but had orthostatic hypotension earlier in admission. Required a dose of midodrine and 250 mL bolus. Amlodipine discontinued. Refused orthostatics this AM. Euvolemic on exam, minimal UF with HD tomorrow.  4. Anemia:See #1.ESA due 3/10, will order aranesp with next HD.  5. Metabolic bone disease:Corrected Calcium8.7.Outpatient binder calcium acetatebut phos low, hold binder for now. 6. Nutrition:Renal diet with fluid restrictions. Albumin low, continue pro-stat 7. T2DM:insulin per primary 8. 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 Plavix on hold, per primary.  Anice Paganini, PA-C 05/05/2019, 11:11 AM  [name] Pager: [phone]

## 2019-05-05 NOTE — Progress Notes (Signed)
Patient OTF in procedure this afternoon. PMT provider will f/u tomorrow for [place] discussion.   NO CHARGE  Ihor Dow, [place], FNP-C Palliative Medicine Team  Phone: [phone] Fax: [phone]

## 2019-05-05 NOTE — Progress Notes (Signed)
Patient verbalized not in any pain, encouraged him to notify nurse if he is in pain or any other needs.

## 2019-05-06 ENCOUNTER — Encounter: Payer: Self-pay | Admitting: *Deleted

## 2019-05-06 DIAGNOSIS — Z515 Encounter for palliative care: Secondary | ICD-10-CM

## 2019-05-06 DIAGNOSIS — Z7189 Other specified counseling: Secondary | ICD-10-CM

## 2019-05-06 DIAGNOSIS — K922 Gastrointestinal hemorrhage, unspecified: Secondary | ICD-10-CM

## 2019-05-06 LAB — RENAL FUNCTION PANEL
Albumin: 2.4 g/dL — ABNORMAL LOW (ref 3.5–5.0)
Anion gap: 14 (ref 5–15)
BUN: 29 mg/dL — ABNORMAL HIGH (ref 8–23)
CO2: 21 mmol/L — ABNORMAL LOW (ref 22–32)
Calcium: 8.1 mg/dL — ABNORMAL LOW (ref 8.9–10.3)
Chloride: 102 mmol/L (ref 98–111)
Creatinine, Ser: 7.12 mg/dL — ABNORMAL HIGH (ref 0.61–1.24)
GFR calc Af Amer: 8 mL/min — ABNORMAL LOW (ref >60)
GFR calc non Af Amer: 7 mL/min — ABNORMAL LOW (ref >60)
Glucose, Bld: 101 mg/dL — ABNORMAL HIGH (ref 70–99)
Phosphorus: 4.1 mg/dL (ref 2.5–4.6)
Potassium: 4.7 mmol/L (ref 3.5–5.1)
Sodium: 137 mmol/L (ref 135–145)

## 2019-05-06 LAB — GLUCOSE, CAPILLARY
Glucose-Capillary: 93 mg/dL (ref 70–99)
Glucose-Capillary: 89 mg/dL (ref 70–99)
Glucose-Capillary: 203 mg/dL — ABNORMAL HIGH (ref 70–99)
Glucose-Capillary: 196 mg/dL — ABNORMAL HIGH (ref 70–99)

## 2019-05-06 LAB — CBC
HCT: 25.7 % — ABNORMAL LOW (ref 39.0–52.0)
Hemoglobin: 8.5 g/dL — ABNORMAL LOW (ref 13.0–17.0)
MCH: 31.1 pg (ref 26.0–34.0)
MCHC: 33.1 g/dL (ref 30.0–36.0)
MCV: 94.1 fL (ref 80.0–100.0)
Platelets: 203 10*3/uL (ref 150–400)
RBC: 2.73 MIL/uL — ABNORMAL LOW (ref 4.22–5.81)
RDW: 16.5 % — ABNORMAL HIGH (ref 11.5–15.5)
WBC: 7.7 10*3/uL (ref 4.0–10.5)
nRBC: 0 % (ref 0.0–0.2)

## 2019-05-06 LAB — MAGNESIUM: Magnesium: 2.3 mg/dL (ref 1.7–2.4)

## 2019-05-06 LAB — SARS CORONAVIRUS 2 (TAT 6-24 HRS): SARS Coronavirus 2: NEGATIVE

## 2019-05-06 MED ORDER — DARBEPOETIN ALFA 100 MCG/0.5ML IJ SOSY
PREFILLED_SYRINGE | INTRAMUSCULAR | Status: AC
  Filled 2019-05-06: qty 0.5

## 2019-05-06 MED ORDER — HEPARIN SODIUM (PORCINE) 1000 UNIT/ML IJ SOLN
INTRAMUSCULAR | Status: AC
  Filled 2019-05-06: qty 1

## 2019-05-06 NOTE — Progress Notes (Signed)
@   3403 completed the dressing change and flush of 500 cc x's 2 for peritoneum.

## 2019-05-06 NOTE — Progress Notes (Signed)
OT Cancellation Note  Patient Details Name: Kyle [name] MRN: [id number] DOB: 09-25-1950   Cancelled Treatment:    Reason Eval/Treat Not Completed: Patient at procedure or test/ unavailable.  Pt at HD, will try back next later as able/appropriate  Britt Bottom 05/06/2019, 9:36 AM

## 2019-05-06 NOTE — Progress Notes (Addendum)
Patient has been accepted to [facility] in [place] on a TTS schedule with a seat time of 11:00am. He can start on Saturday, 05/09/19 and needs to arrive at 10:30am on his first day at the clinic in order to sign consents.  Patient has had 2 inpt HD treatments this week (M and W). Renal Navigator spoke with Nephrology team who state that patient can have his next treatment (3rd) at the outpatient clinic on Saturday in order to transition to TTS schedule.  Patient has been cleared for discharge from an OP HD standpoint. Team updated.  Alphonzo Cruise, [place] Renal Navigator [phone]

## 2019-05-06 NOTE — Progress Notes (Signed)
PT Cancellation Note  Patient Details Name: Kyle [name] MRN: [id number] DOB: 14-Aug-1950   Cancelled Treatment:    Reason Eval/Treat Not Completed: (P) Patient at procedure or test/unavailable Pt is off floor for HD. PT will follow back this afternoon for treatment as able.   Placido Hangartner B. Migdalia Dk PT, DPT Acute Rehabilitation Services Pager [phone] Office [phone]  [place] 05/06/2019, 8:24 AM

## 2019-05-06 NOTE — Progress Notes (Signed)
Physical Therapy Treatment Patient Details Name: Kyle [name] MRN: [id number] DOB: 23-Jul-1950 Today's Date: 05/06/2019    History of Present Illness Patient is a 69 y/o male who presents from [facility] for evaluation of low hemoglobin. Recently hospitalized from 04/04/2019-04/16/2019 for GI bleed. Admitted with Acute on chronic blood loss anemia. Currently undergoing capsule endoscopy 3/5. PMH includes seizures, HTN, HLD, DM, CVA, depression, COPD, CKD, anxiety.    PT Comments    Pt initially refused therapy, however was found to be incontinent of stool. Pt requires min guard for rolling L and R for pericare. Once clean pt reluctantly agrees to standing up, requiring modA to come to seated and modAx2 for sit>stand. Pt with increased R sided pain. Encouraged to take 3 steps to [facility] requiring max A on R and minA on L. D/c plans remain appropriate. PT will continue to follow acutely.    Follow Up Recommendations  SNF     Equipment Recommendations  Other (comment)(TBA)       Precautions / Restrictions Precautions Precautions: Fall Restrictions Weight Bearing Restrictions: No    Mobility  Bed Mobility Overal bed mobility: Needs Assistance Bed Mobility: Sit to Supine;Rolling;Sidelying to Sit Rolling: Min guard Sidelying to sit: Mod assist   Sit to supine: HOB elevated;Mod assist   General bed mobility comments: modA for bring trunk to upright and scooting hips to EoB, modA for bringing LE into bed as pt did not bring them in together and pt experienced increased pain as R LE assisted back to bed  Transfers Overall transfer level: Needs assistance Equipment used: Rolling walker (2 wheeled) Transfers: Sit to/from Stand Sit to Stand: Mod assist;+2 physical assistance         General transfer comment: modAx2 for power up to RW, increased R lateral lean, unable to correct with cuing  Ambulation/Gait Ambulation/Gait assistance: Max assist;Min assist Gait Distance (Feet): 3  Feet Assistive device: Rolling walker (2 wheeled) Gait Pattern/deviations: Shuffle Gait velocity: decreased Gait velocity interpretation: <1.31 ft/sec, indicative of household ambulator General Gait Details: Able to take a few steps along side bed with Max A on R and min A on L, before pt lowered himself to the bed with decreased control     Modified Rankin (Stroke Patients Only) Modified Rankin (Stroke Patients Only) Pre-Morbid Rankin Score: Moderately severe disability Modified Rankin: Moderately severe disability     Balance                                            Cognition Arousal/Alertness: Awake/alert Behavior During Therapy: WFL for tasks assessed/performed Overall Cognitive Status: No family/caregiver present to determine baseline cognitive functioning                           Safety/Judgement: Decreased awareness of safety     General Comments: Poor insight into functional deficits. Appears confused with timeline of last 2 months. A&Ox4. Easily irritated.         General Comments General comments (skin integrity, edema, etc.): VSS      Pertinent Vitals/Pain Pain Assessment: 0-10 Faces Pain Scale: Hurts even more Pain Location: ribs  Pain Descriptors / Indicators: Aching;Discomfort;Moaning Pain Intervention(s): Limited activity within patient's tolerance;Monitored during session;Repositioned           PT Goals (current goals can now be found in the care plan section)  Acute Rehab PT Goals PT Goal Formulation: With patient Time For Goal Achievement: 05/15/19 Potential to Achieve Goals: Good Progress towards PT goals: Not progressing toward goals - comment(limited by pain and fatigue)    Frequency    Min 2X/week      PT Plan Current plan remains appropriate       AM-PAC PT "6 Clicks" Mobility   Outcome Measure  Help needed turning from your back to your side while in a flat bed without using bedrails?:  None Help needed moving from lying on your back to sitting on the side of a flat bed without using bedrails?: Total Help needed moving to and from a bed to a chair (including a wheelchair)?: A Lot Help needed standing up from a chair using your arms (e.g., wheelchair or bedside chair)?: A Lot Help needed to walk in hospital room?: A Lot Help needed climbing 3-5 steps with a railing? : Total 6 Click Score: 12    End of Session Equipment Utilized During Treatment: Gait belt Activity Tolerance: Patient limited by fatigue;Patient limited by pain Patient left: in bed;with call bell/phone within reach;with bed alarm set Nurse Communication: Mobility status PT Visit Diagnosis: Unsteadiness on feet (R26.81);Other abnormalities of gait and mobility (R26.89);Muscle weakness (generalized) (M62.81) Hemiplegia - dominant/non-dominant: Dominant Hemiplegia - caused by: Cerebral infarction     Time: 3953-2023 PT Time Calculation (min) (ACUTE ONLY): 22 min  Charges:  $Gait Training: 8-22 mins                     Maylin Freeburg B. Migdalia Dk PT, DPT Acute Rehabilitation Services Pager [phone] Office [phone]    [place] 05/06/2019, 5:35 PM

## 2019-05-06 NOTE — Progress Notes (Signed)
Patient ID: Kyle [name], male   DOB: Jul 07, 1950, 69 y.o.   MRN: [id number]  PROGRESS NOTE    Kyle [name]  WIO:[id number] DOB: 04/13/1950 DOA: 04/29/2019 PCP: Idelle Crouch, MD   Brief Narrative:  69 year old male with history of ESRD on hemodialysis, unspecified stroke in January 2021 with residual right-sided weakness/left-sided ptosis with 3rd cranial nerve palsy, diabetes mellitus type 2 insulin-dependent, hypertension, hyperlipidemia, hypothyroidism, seizure disorder, recent admission for GI bleeding from 04/04/2019-04/16/2019 with subsequent EGD showing gastritis and mucosal changes in the duodenum and colonoscopy which showed single nonbleeding colonic angiodysplastic lesion treated with a monopolar probe presented for evaluation of low hemoglobin noted on outside labs.  In the ED hemoglobin was 6.6.  Patient was started on IV Protonix; GI was consulted.  He received packed red cells transfusion.  Nephrology was also consulted.  Capsule endoscopy showed several areas of minimal bleeding in the proximal small bowel.  He underwent small bowel push enteroscopy on 05/05/2019 which showed a single nonbleeding AVM which was treated with APC.  Assessment & Plan:   Acute on chronic blood loss anemia/GI bleeding -Patient had recent admission for GI bleeding and underwent EGD on 04/05/2019 which had shown gastritis with mucosal changes in the duodenum followed by colonoscopy on 04/09/2019 showing a single nonbleeding colonic angiodysplastic lesion status post monopolar probe treatment and diverticulosis throughout the colon -Presented with melanotic stools with 6.6 hemoglobin -Status post 2 units packed red cell transfusion on presentation -Initially started on Protonix drip -Capsule endoscopy showed several areas of minimal bleeding in the proximal small bowel.  He underwent small bowel push enteroscopy on 05/05/2019 which showed a single nonbleeding AVM which was treated with APC.  GI signed off  on 05/05/2019 and recommended that Plavix could be restarted from 05/06/2019.  Outpatient follow-up with GI -No further black or bloody stools.  Hemoglobin 8.5 today. 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  -Plavix restarted today as per GI recommendations  End-stage renal disease on hemodialysis -Nephrology following.  Dialysis as per nephrology schedule  Orthostatic hypotension -Patient had episodes of orthostatic hypotension during the hospitalization requiring saline bolus and oral midodrine. -Subsequently blood pressure is improving.  IV fluids has been discontinued. -Norvasc also has been discontinued  Diabetes mellitus type 2 -A1c 7.1 on 03/17/2019 -Continue CBGs with SSI  Hypertension -Continue Toprol-XL.  Norvasc on hold due to orthostatic hypotension.  Monitor.  Hypothyroidism--continue Synthroid  Seizure disorder -Continue Keppra  Generalized conditioning -Patient from a SNF.  PT recommending SNF.  Social worker consulted. -Palliative care has been consulted for goals of care discussion.   DVT prophylaxis: SCDs Code Status: Full Family Communication: Spoke to patient at bedside Disposition Plan: Currently medically stable for discharge.  SNF once bed is available  Consultants:   Procedures:  Capsule endoscopy  Small bowel push enteroscopy on 05/05/2019 Findings: Normal-appearing esophagus. Erythematous mucosa in cardia, fundus, body, antrum and pylorus. Nodular and inflamed mucosa noted in duodenal bulb.  Large submucosal mass, possibly a lipoma found in second portion of the duodenum. A single nonbleeding AVM found on the surface of the large submucosal lesion, treated with  APC.  Significant looping and intestinal spasms was attempted. Examined portion of the proximal jejunum appeared  unremarkable. There was no evidence of recent or active bleeding.  Antimicrobials: None   Subjective: Patient seen and examined at bedside undergoing dialysis.  He denies any further black or bloody stools.  No overnight fever, vomiting reported.  Objective: Vitals:   05/06/19 0930 05/06/19 1000 05/06/19 1030 05/06/19 1106  BP: (!) 157/69 (!) 154/66 (!) 153/68 (!) 154/66  Pulse: 77 82 81 83  Resp:    20  Temp:    97.7 F (36.5 C)  TempSrc:    Oral  SpO2:    99%  Weight:    83.9 kg  Height:        Intake/Output Summary (Last 24 hours) at 05/06/2019 1341 Last data filed at 05/06/2019 1057 Gross per 24 hour  Intake 620 ml  Output 0 ml  Net 620 ml   Filed Weights   05/05/19 2001 05/06/19 0652 05/06/19 1106  Weight: 84.8 kg 83.9 kg 83.9 kg    Examination:  General exam: Appears calm and comfortable.  Poor historian.  Chronically ill looking Respiratory system: Bilateral decreased breath sounds at bases Cardiovascular system: S1 & S2 heard, Rate controlled Gastrointestinal system: Abdomen is nondistended, soft and nontender. Normal bowel sounds heard. Extremities: No cyanosis, clubbing; trace lower extremity edema  Data Reviewed: I have personally reviewed following labs and imaging studies  CBC: Recent Labs  Lab 04/29/19 1910 04/30/19 0442 05/01/19 2122 05/01/19 2122 05/02/19 0556 05/02/19 0556 05/02/19 1430 05/03/19 0402 05/04/19 0725 05/05/19 0718 05/06/19 0341  WBC 11.3*   < > 7.4  --  6.4  --   --  7.2 7.1  --  7.7  NEUTROABS 9.8*  --   --   --   --   --   --  5.0 5.2  --   --   HGB 6.6*   < > 9.4*   < > 8.9*   < > 8.7* 8.8* 8.0* 8.6* 8.5*  HCT 20.3*   < > 28.6*   < > 27.0*   < > 26.1* 26.7* 25.3* 26.8* 25.7*  MCV 100.5*   < > 93.5  --  93.1  --   --  94.0 97.3  --  94.1  PLT 266   < > 255  --  235  --   --  245 215  --  203   < > = values  in this interval not displayed.   Basic Metabolic Panel: Recent Labs  Lab 05/02/19 0556 05/03/19 0402 05/04/19 0726 05/05/19 0718 05/06/19 0341  NA 137 139 141 138 137  K 3.8 3.7 4.0 3.8 4.7  CL 100 100 101 100 102  CO2 24 25 25 26  21*  GLUCOSE 101* 158* 141* 127* 101*  BUN 12 23 38* 16 29*  CREATININE 4.26* 6.44* 8.49* 5.46* 7.12*  CALCIUM 7.6* 7.7* 7.2* 7.5* 8.1*  MG  --   --   --  1.4* 2.3  PHOS  --  2.1* 3.0 2.7 4.1   GFR: Estimated Creatinine Clearance: 10.6 mL/min (A) (by C-G formula based on SCr of 7.12 mg/dL (H)). Liver Function Tests: Recent Labs  Lab 04/29/19 1910 05/03/19 0402 05/04/19 0726 05/05/19 0718 05/06/19 0341  AST 21  --   --   --   --   ALT 11  --   --   --   --   ALKPHOS 85  --   --   --   --   BILITOT 1.3*  --   --   --   --  PROT 5.5*  --   --   --   --   ALBUMIN 2.5* 2.3* 2.2* 2.4* 2.4*   No results for input(s): LIPASE, AMYLASE in the last 168 hours. No results for input(s): AMMONIA in the last 168 hours. Coagulation Profile: Recent Labs  Lab 04/29/19 1910  INR 0.9   Cardiac Enzymes: No results for input(s): CKTOTAL, CKMB, CKMBINDEX, TROPONINI in the last 168 hours. BNP (last 3 results) No results for input(s): PROBNP in the last 8760 hours. HbA1C: No results for input(s): HGBA1C in the last 72 hours. CBG: Recent Labs  Lab 05/05/19 1111 05/05/19 1623 05/05/19 2059 05/06/19 0625 05/06/19 1121  GLUCAP 149* 164* 141* 93 89   Lipid Profile: No results for input(s): CHOL, HDL, LDLCALC, TRIG, CHOLHDL, LDLDIRECT in the last 72 hours. Thyroid Function Tests: No results for input(s): TSH, T4TOTAL, FREET4, T3FREE, THYROIDAB in the last 72 hours. Anemia Panel: No results for input(s): VITAMINB12, FOLATE, FERRITIN, TIBC, IRON, RETICCTPCT in the last 72 hours. Sepsis Labs: No results for input(s): PROCALCITON, LATICACIDVEN in the last 168 hours.  Recent Results (from the past 240 hour(s))  SARS CORONAVIRUS 2 (TAT 6-24 HRS)  Nasopharyngeal Nasopharyngeal Swab     Status: None   Collection Time: 04/30/19 11:48 AM   Specimen: Nasopharyngeal Swab  Result Value Ref Range Status   SARS Coronavirus 2 NEGATIVE NEGATIVE Final    Comment: (NOTE) SARS-CoV-2 target nucleic acids are NOT DETECTED. The SARS-CoV-2 RNA is generally detectable in upper and lower respiratory specimens during the acute phase of infection. Negative results do not preclude SARS-CoV-2 infection, do not rule out co-infections with other pathogens, and should not be used as the sole basis for treatment or other patient management decisions. Negative results must be combined with clinical observations, patient history, and epidemiological information. The expected result is Negative. Fact Sheet for Patients: SugarRoll.be Fact Sheet for Healthcare Providers: https://www.woods-mathews.com/ This test is not yet approved or cleared by the [place] FDA and  has been authorized for detection and/or diagnosis of SARS-CoV-2 by FDA under an Emergency Use Authorization (EUA). This EUA will remain  in effect (meaning this test can be used) for the duration of the COVID-19 declaration under Section 56 4(b)(1) of the Act, 21 U.S.C. section 360bbb-3(b)(1), unless the authorization is terminated or revoked sooner. Performed at [facility], [place] [address], [place], [place] [address]          Radiology Studies: No results found.      Scheduled Meds: . acetaminophen  500 mg Oral TID  . atorvastatin  80 mg Oral QHS  . calcium acetate  1,334 mg Oral TID WC  . Chlorhexidine Gluconate Cloth  6 each Topical Q0600  . clopidogrel  75 mg Oral Daily  . Darbepoetin Alfa      . darbepoetin (ARANESP) injection - DIALYSIS  100 mcg Intravenous Q Wed-HD  . feeding supplement (NEPRO CARB STEADY)  237 mL Oral BID BM  . feeding supplement (PRO-STAT SUGAR FREE 64)  30 mL Oral BID  . ferrous sulfate  325 mg Oral  Q breakfast  . gentamicin cream  1 application Topical Daily  . heparin      . insulin aspart  0-5 Units Subcutaneous QHS  . insulin aspart  0-9 Units Subcutaneous TID WC  . levETIRAcetam  500 mg Oral BID  . levothyroxine  200 mcg Oral QAC breakfast  . lipase/protease/amylase  36,000 Units Oral TID AC  . metoprolol succinate  50 mg  Oral Daily  . multivitamin  1 tablet Oral Daily  . nicotine  14 mg Transdermal Daily  . pantoprazole  40 mg Oral BID  . polyethylene glycol  17 g Oral Daily   Continuous Infusions: . sodium chloride 250 mL (05/05/19 1015)          Aline August, MD [facility] 05/06/2019, 1:41 PM

## 2019-05-06 NOTE — Progress Notes (Signed)
[facility] Progress Note   Subjective:   Patient seen on HD. Reports he is still getting dizzy when sitting. Denies SOB, CP, palpitations, abdominal pain, N/V/D. No further bleeding reported, had small bowel enteroscopy on 3/9.  Objective Vitals:   05/06/19 0830 05/06/19 0900 05/06/19 0930 05/06/19 1000  BP: (!) 158/62 (!) 150/65 (!) 157/69 (!) 154/66  Pulse: (!) 57 75 77 82  Resp: 20     Temp:      TempSrc:      SpO2: 99%     Weight:      Height:       Physical Exam General:Well developed, chronically ill appearing male in NAD Head:Normocephalic, atraumatic, L eye ptosis Lungs: Clear bilaterally to auscultation without wheezes, rales, or rhonchi.  Heart:RRR with normal S1, S2. No murmurs, rubs, or gallops appreciated. Abdomen: Soft, non-tender, non-distended with normoactive bowel sounds. Lower extremities: No edemabilateral lower extremities Dialysis Access:TDC without surrounding erythema/drainage. Maturing LUE AVF + bruit  Additional Objective Labs: Basic Metabolic Panel: Recent Labs  Lab 05/04/19 0726 05/05/19 0718 05/06/19 0341  NA 141 138 137  K 4.0 3.8 4.7  CL 101 100 102  CO2 25 26 21*  GLUCOSE 141* 127* 101*  BUN 38* 16 29*  CREATININE 8.49* 5.46* 7.12*  CALCIUM 7.2* 7.5* 8.1*  PHOS 3.0 2.7 4.1   Liver Function Tests: Recent Labs  Lab 04/29/19 1910 05/03/19 0402 05/04/19 0726 05/05/19 0718 05/06/19 0341  AST 21  --   --   --   --   ALT 11  --   --   --   --   ALKPHOS 85  --   --   --   --   BILITOT 1.3*  --   --   --   --   PROT 5.5*  --   --   --   --   ALBUMIN 2.5*   < > 2.2* 2.4* 2.4*   < > = values in this interval not displayed.   No results for input(s): LIPASE, AMYLASE in the last 168 hours. CBC: Recent Labs  Lab 04/29/19 1910 04/30/19 0442 05/01/19 2122 05/01/19 2122 05/02/19 0556 05/02/19 1430 05/03/19 0402 05/03/19 0402 05/04/19 0725 05/05/19 0718 05/06/19 0341  WBC 11.3*   < > 7.4   < > 6.4  --  7.2   --  7.1  --  7.7  NEUTROABS 9.8*  --   --   --   --   --  5.0  --  5.2  --   --   HGB 6.6*   < > 9.4*   < > 8.9*   < > 8.8*   < > 8.0* 8.6* 8.5*  HCT 20.3*   < > 28.6*   < > 27.0*   < > 26.7*   < > 25.3* 26.8* 25.7*  MCV 100.5*   < > 93.5  --  93.1  --  94.0  --  97.3  --  94.1  PLT 266   < > 255   < > 235  --  245  --  215  --  203   < > = values in this interval not displayed.   Blood Culture    Component Value Date/Time   SDES BLOOD RIGHT ANTECUBITAL 04/08/2019 1612   SDES BLOOD RIGHT HAND 04/08/2019 1612   SPECREQUEST  04/08/2019 1612    BOTTLES DRAWN AEROBIC AND ANAEROBIC Blood Culture adequate volume   SPECREQUEST  04/08/2019 1612  BOTTLES DRAWN AEROBIC AND ANAEROBIC Blood Culture adequate volume   CULT  04/08/2019 1612    NO GROWTH 5 DAYS Performed at [facility], [place] [address], [place], [place] [address]    CULT  04/08/2019 1612    NO GROWTH 5 DAYS Performed at [facility], [place] [address], [place], [place] [address]    REPTSTATUS 04/13/2019 FINAL 04/08/2019 1612   REPTSTATUS 04/13/2019 FINAL 04/08/2019 1612    Cardiac Enzymes: No results for input(s): CKTOTAL, CKMB, CKMBINDEX, TROPONINI in the last 168 hours. CBG: Recent Labs  Lab 05/05/19 0706 05/05/19 1111 05/05/19 1623 05/05/19 2059 05/06/19 0625  GLUCAP 108* 149* 164* 141* 93   Iron Studies: No results for input(s): IRON, TIBC, TRANSFERRIN, FERRITIN in the last 72 hours. @lablastinr3 @ Studies/Results: No results found. Medications: . sodium chloride 250 mL (05/05/19 1015)   . acetaminophen  500 mg Oral TID  . atorvastatin  80 mg Oral QHS  . calcium acetate  1,334 mg Oral TID WC  . Chlorhexidine Gluconate Cloth  6 each Topical Q0600  . clopidogrel  75 mg Oral Daily  . darbepoetin (ARANESP) injection - DIALYSIS  100 mcg Intravenous Q Wed-HD  . feeding supplement (NEPRO CARB STEADY)  237 mL Oral BID BM  . feeding supplement (PRO-STAT SUGAR FREE 64)  30 mL Oral BID  . ferrous sulfate   325 mg Oral Q breakfast  . gentamicin cream  1 application Topical Daily  . insulin aspart  0-5 Units Subcutaneous QHS  . insulin aspart  0-9 Units Subcutaneous TID WC  . levETIRAcetam  500 mg Oral BID  . levothyroxine  200 mcg Oral QAC breakfast  . lipase/protease/amylase  36,000 Units Oral TID AC  . metoprolol succinate  50 mg Oral Daily  . multivitamin  1 tablet Oral Daily  . nicotine  14 mg Transdermal Daily  . pantoprazole  40 mg Oral BID  . polyethylene glycol  17 g Oral Daily    [facility] Time 4 hours, BFR 400/DFR 800, 3K, 2.25Ca, TDC(AVF maturing), EDW 83.5kg (just lowered) Heparin 2000 unit bolus Mircera 100 mcg IV q 2 weeks- last dose 04/22/19 Calcium acetate 667mg  TID AC   Assessment/Plan: 1. GI bleed: Recurrent, had recent admit in 03/2019, EGD during that work up revealed gastritis with peptic ulcer, colonoscopy with single non bleeding angiodysplastic lesion and diverticulosis. Hgb 6.6 on admission, received 2 unitsPRBC>>stable in the 8's.Capsule study showed multiple areas of minimal bleeding, underwent enteroscopy on 3/9 with no evidence of active bleeding.  2. ESRD:onMWF.K+4.7.Plan for HD 3/12 per regular schedule. No heparin.  3. Hypertension/volume:BP elevated at present but had orthostatic hypotensionearlier in admission. Required a dose of midodrine and 250 mL bolus. Amlodipine discontinued. Euvolemic on exam, no UF with HD today, will let volume come up a bit.  4. Anemia:See #1.ESA due 3/10. Aranesp schedule with HD today. 5. Metabolic bone disease:Corrected Calcium9.4.Outpatient binder calcium acetatebut phos low earlier in admission, binder on hold. Phos 4.1, follow, can likely resume binder at discharge.  6. Nutrition:Renal diet with fluid restrictions. Albumin low,continuepro-stat 7. T2DM:insulin per primary 8. 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 Plavix on hold, per primary.  Anice Paganini, PA-C 05/06/2019, 10:32 AM  [facility] Pager: [phone]

## 2019-05-06 NOTE — Consult Note (Signed)
Consultation Note Date: 05/06/19  Patient Name: Kyle [name]  DOB: 09-25-1950  MRN: [id number]  Age / Sex: 69 y.o., male  PCP: Idelle Crouch, MD Referring Physician: Aline August, MD  Reason for Consultation: Establishing goals of care  HPI/Patient Profile: 69 y.o. male  with past medical history of ESRD on hemodialysis, CVA January 2021 with residual right-sided weakness/left-sided ptosis with 3rd cranial nerve palsy, DM type 2 insulin-dependent, hypertension, hyperlipidemia, hypothyroidism, seizure disorder, recent admission for GI bleeding 04/04/19-04/16/19 with EGD revealing gastritis and mucosal changes in duodenum and colonoscopy with single non-bleeding colonic angiodysplastic lesion admitted on 04/29/2019 from SNF rehab with low hemoglobin (6.6 on admit). S/p 2 units PRBC. Nephrology and GI following.   Clinical Assessment and Goals of Care:  I have reviewed medical records, discussed with care team, and met with patient at bedside to discuss goals of care. Patient alert, oriented, and able to participate in discussion.    I introduced Palliative Medicine as specialized medical care for people living with serious illness. It focuses on providing relief from the symptoms and stress of a serious illness. The goal is to improve quality of life for both the patient and the family.  We discussed a brief life review of the patient. Married to wife for 41 years. No children. His dog and cats bring him joy. [name] was on peritoneal dialysis for 15 months and recently had to transition to hemodialysis in February 2021 following his stroke. Prior to stroke, he was living home and independent.   Discussed events leading up to admission and course of hospitalization including diagnoses, interventions, plan of care.   Advanced directives, concepts specific to code status, artifical feeding and hydration were  discussed. Patient reports documented living will. Not on file. Will request from wife. Patient speaks of his desire for FULL code/FULL scope treatment. "I just don't want to die." Encouraged ongoing discussions with his wife as time goes on, especially since he is decisional and able to express his wishes. We did discuss dialysis being a form of life support.   I attempted to elicit values and goals of care important to the patient. Patient is eager to discharge back to SNF rehab and hopeful to get stronger. "Be able to walk again." Last hospitalization, he was ambulating with physical therapists with assist of a walker. He ultimately is hopeful to get back home following rehab stay.   Palliative Care services outpatient were explained and offered. Questions answered. Wife not at bedside during visit.     SUMMARY OF RECOMMENDATIONS    Patient desires FULL code/FULL scope treatment. Continue hemodialysis.   Requested living will documentation for EMR.  Patient hopeful to be able to progress with therapy at SNF and ultimately return home following rehab stay. He is hopeful to get stronger and walk again.   Ongoing palliative discussions. May benefit from outpatient palliative referral.   Code Status/Advance Care Planning:  Full code  Symptom Management:   Per attending  Palliative Prophylaxis:   Aspiration, Delirium Protocol,  Oral Care and Turn Reposition  Additional Recommendations (Limitations, Scope, Preferences):  Full Scope Treatment  Psycho-social/Spiritual:   Desire for further Chaplaincy support: yes  Additional Recommendations: Caregiving  Support/Resources  Prognosis:   Unable to determine  Discharge Planning: [place] for rehab with Palliative care service follow-up      Primary Diagnoses: Present on Admission: . Acute on chronic blood loss anemia . Hypothyroidism . Hyperlipidemia associated with type 2 diabetes mellitus ([place]) . ESRD (end  stage renal disease) ([place]) . Hypertension associated with diabetes ([place]) . Chronic obstructive pulmonary disease ([place]) . Acute blood loss anemia . Orthostasis   I have reviewed the medical record, interviewed the patient and family, and examined the patient. The following aspects are pertinent.  Past Medical History:  Diagnosis Date  . Anxiety   . BPH (benign prostatic hyperplasia)   . Chronic kidney disease   . Colon polyps   . COPD (chronic obstructive pulmonary disease) ([place])   . Depression   . Diabetes mellitus without complication ([place])   . Diverticulosis of colon   . Hyperlipidemia   . Hypertension   . Hypothyroidism   . Nephrolithiasis   . Seizures ([place])    Social History   Socioeconomic History  . Marital status: Married    Spouse name: Not on file  . Number of children: Not on file  . Years of education: Not on file  . Highest education level: Not on file  Occupational History  . Not on file  Tobacco Use  . Smoking status: Light Tobacco Smoker    Packs/day: 1.00    Years: 40.00    Pack years: 40.00    Types: Cigarettes    Last attempt to quit: 05/25/2016    Years since quitting: 2.9  . Smokeless tobacco: Never Used  Substance and Sexual Activity  . Alcohol use: Yes    Alcohol/week: 8.0 standard drinks    Types: 8 Cans of beer per week    Comment: beer or wine   . Drug use: No  . Sexual activity: Not on file  Other Topics Concern  . Not on file  Social History Narrative  . Not on file   Social Determinants of Health   Financial Resource Strain:   . Difficulty of Paying Living Expenses:   Food Insecurity:   . Worried About Charity fundraiser in the Last Year:   . Arboriculturist in the Last Year:   Transportation Needs:   . Film/video editor (Medical):   [name] Lack of Transportation (Non-Medical):   Physical Activity:   . Days of Exercise per Week:   . Minutes of Exercise per Session:   Stress:   . Feeling of Stress :   Social  Connections:   . Frequency of Communication with Friends and Family:   . Frequency of Social Gatherings with Friends and Family:   . Attends Religious Services:   . Active Member of Clubs or Organizations:   . Attends Archivist Meetings:   [name] Marital Status:    Family History  Problem Relation Age of Onset  . Diabetes Mother   . Diabetes Maternal Grandmother   . Diabetes Maternal Grandfather   . Lung cancer Father   . Emphysema Paternal Grandfather    Scheduled Meds: . acetaminophen  500 mg Oral TID  . atorvastatin  80 mg Oral QHS  . calcium acetate  1,334 mg Oral TID WC  . Chlorhexidine Gluconate Cloth  6  each Topical [id number]  . clopidogrel  75 mg Oral Daily  . darbepoetin (ARANESP) injection - DIALYSIS  100 mcg Intravenous Q Wed-HD  . feeding supplement (NEPRO CARB STEADY)  237 mL Oral BID BM  . feeding supplement (PRO-STAT SUGAR FREE 64)  30 mL Oral BID  . ferrous sulfate  325 mg Oral Q breakfast  . gentamicin cream  1 application Topical Daily  . insulin aspart  0-5 Units Subcutaneous QHS  . insulin aspart  0-9 Units Subcutaneous TID WC  . levETIRAcetam  500 mg Oral BID  . levothyroxine  200 mcg Oral QAC breakfast  . lipase/protease/amylase  36,000 Units Oral TID AC  . metoprolol succinate  50 mg Oral Daily  . multivitamin  1 tablet Oral Daily  . nicotine  14 mg Transdermal Daily  . pantoprazole  40 mg Oral BID  . polyethylene glycol  17 g Oral Daily   Continuous Infusions: . sodium chloride 250 mL (05/05/19 1015)   PRN Meds:.sodium chloride, acetaminophen **OR** acetaminophen, traMADol Medications Prior to Admission:  Prior to Admission medications   Medication Sig Start Date End Date Taking? Authorizing Provider  amLODipine (NORVASC) 2.5 MG tablet Take 1 tablet (2.5 mg total) by mouth daily. 03/20/19  Yes Wieting, Richard, MD  atorvastatin (LIPITOR) 80 MG tablet Take 1 tablet (80 mg total) by mouth daily. Patient taking differently: Take 80 mg by mouth at  bedtime.  03/20/19 04/29/19 Yes Wieting, Richard, MD  B Complex-C-Folic Acid (NEPHRO-VITE PO) Take 1 tablet by mouth daily.   Yes [provider]  calcium acetate (PHOSLO) 667 MG capsule Take 2 capsules (1,334 mg total) by mouth 3 (three) times daily with meals. 03/20/19  Yes Wieting, Richard, MD  clopidogrel (PLAVIX) 75 MG tablet Take 1 tablet (75 mg total) by mouth daily. 04/17/19 05/17/19 Yes Kayleen Memos, DO  ferrous sulfate (FERROUSUL) 325 (65 FE) MG tablet Take 325 mg by mouth daily with breakfast. 04/21/19 05/19/19 Yes [provider]  gentamicin cream (GARAMYCIN) 0.1 % Apply 1 application topically daily. Patient taking differently: Apply 1 application topically See admin instructions. Apply to affected area once daily for infection 03/20/19  Yes Wieting, Richard, MD  insulin lispro (HUMALOG) 100 UNIT/ML KwikPen 2 units subcutaneous injection for sugars 200-250, 3 units for sugars 251-300; 4 units for sugars greater than 301 Patient taking differently: Inject 0-12 Units into the skin See admin instructions. Inject 0-12 units into the skin three times a day before meals, depending on BGL: <70 = CALL MD; 70-200 = give nothing; 201-250 = 2 units; 251-300 = 4 units; 301-350 = 6 units; 351-400 = 8 units; 401-450 = 10 units; 451-600 = 12 units 03/20/19  Yes Wieting, Richard, MD  levETIRAcetam (KEPPRA) 500 MG tablet Take 500 mg by mouth 2 (two) times a day. 09/05/15  Yes [provider]  levothyroxine (SYNTHROID) 200 MCG tablet Take 200 mcg by mouth every morning. 02/25/19  Yes [provider]  losartan (COZAAR) 50 MG tablet Take 1 tablet (50 mg total) by mouth daily. 04/17/19 05/17/19 Yes Kayleen Memos, DO  metoprolol succinate (TOPROL-XL) 50 MG 24 hr tablet Take 1 tablet (50 mg total) by mouth daily. Take with or immediately following a meal. 04/17/19 05/17/19 Yes Hall, Carole N, DO  nicotine (NICODERM CQ - DOSED IN MG/24 HOURS) 14 mg/24hr patch Place 14 mg onto the skin  daily.   Yes [provider]  Pancrelipase, Lip-Prot-Amyl, 24000-76000 units CPEP Take 1 capsule by mouth 3 (three)  times daily.   Yes [provider]  pantoprazole (PROTONIX) 40 MG tablet Take 1 tablet (40 mg total) by mouth 2 (two) times daily. 04/16/19 05/16/19 Yes Hall, Lorenda Cahill, DO  polyethylene glycol powder (GLYCOLAX/MIRALAX) 17 GM/SCOOP powder Take 17 g by mouth daily.   Yes [provider]  multivitamin (RENA-VIT) TABS tablet Take 1 tablet by mouth at bedtime. Patient not taking: Reported on 04/29/2019 03/20/19   Loletha Grayer, MD  Nutritional Supplements (FEEDING SUPPLEMENT, NEPRO CARB STEADY,) LIQD Take 237 mLs by mouth 2 (two) times daily between meals. Patient not taking: Reported on 04/29/2019 03/20/19   Loletha Grayer, MD   No Known Allergies Review of Systems  All other systems reviewed and are negative.  Physical Exam Vitals and nursing note reviewed.  Constitutional:      General: He is awake.  HENT:     Head: Normocephalic and atraumatic.  Pulmonary:     Effort: No tachypnea, accessory muscle usage or respiratory distress.  Skin:    General: Skin is warm and dry.  Neurological:     Mental Status: He is alert and oriented to person, place, and time.  Psychiatric:        Mood and Affect: Mood normal.        Speech: Speech normal.        Behavior: Behavior normal.        Cognition and Memory: Cognition normal.    Vital Signs: BP 124/62 (BP Location: Right Arm)   Pulse 63   Temp 97.8 F (36.6 C)   Resp 18   Ht 6' (1.829 m)   Wt 83.9 kg   SpO2 97%   BMI 25.09 kg/m  Pain Scale: 0-10 POSS *See Group Information*: 1-Acceptable,Awake and alert Pain Score: 0-No pain   SpO2: SpO2: 97 % O2 Device:SpO2: 97 % O2 Flow Rate: .O2 Flow Rate (L/min): 2 L/min  IO: Intake/output summary:   Intake/Output Summary (Last 24 hours) at 05/07/2019 0454 Last data filed at 05/07/2019 0700 Gross per 24 hour  Intake 420 ml  Output 0 ml  Net 420 ml     LBM: Last BM Date: 05/04/19 Baseline Weight: Weight: 93.9 kg Most recent weight: Weight: 83.9 kg     Palliative Assessment/Data: PPS 50%   Flowsheet Rows     Most Recent Value  Intake Tab  Referral Department  Hospitalist  Unit at Time of Referral  Med/Surg Unit  Palliative Care Primary Diagnosis  Nephrology  Palliative Care Type  New Palliative care  Reason for referral  Clarify Goals of Care  Date first seen by Palliative Care  05/06/19  Clinical Assessment  Palliative Performance Scale Score  50%  Psychosocial & Spiritual Assessment  Palliative Care Outcomes  Patient/Family meeting held?  Yes  Who was at the meeting?  patient  Palliative Care Outcomes  Clarified goals of care, Provided psychosocial or spiritual support, Linked to palliative care logitudinal support, ACP counseling assistance      Time In: 1325 Time Out: 1425 Time Total: 60 Greater than 50%  of this time was spent counseling and coordinating care related to the above assessment and plan.  Signed by:  Ihor Dow, DNP, FNP-C Palliative Medicine Team  Phone: [phone] Fax: [phone]   Please contact Palliative Medicine Team phone at [phone] for questions and concerns.  For individual provider: See Shea Evans

## 2019-05-06 NOTE — Progress Notes (Signed)
Renal Navigator faxed requested medical records to [facility] to complete transfer request and spoke with Admissions Coordinator/Keri who will now speak with the OP HD clinic/[place] to ensure they have all needed information to accept him back for treatment at discharge to SNF. Renal Navigator will continue to follow closely.  Alphonzo Cruise, [place] Renal Navigator [phone]

## 2019-05-07 DIAGNOSIS — Z515 Encounter for palliative care: Secondary | ICD-10-CM

## 2019-05-07 DIAGNOSIS — Z7189 Other specified counseling: Secondary | ICD-10-CM

## 2019-05-07 LAB — CBC WITH DIFFERENTIAL/PLATELET
Abs Immature Granulocytes: 0.03 10*3/uL (ref 0.00–0.07)
Basophils Absolute: 0.1 10*3/uL (ref 0.0–0.1)
Basophils Relative: 1 %
Eosinophils Absolute: 0.1 10*3/uL (ref 0.0–0.5)
Eosinophils Relative: 2 %
HCT: 24.2 % — ABNORMAL LOW (ref 39.0–52.0)
Hemoglobin: 7.9 g/dL — ABNORMAL LOW (ref 13.0–17.0)
Immature Granulocytes: 1 %
Lymphocytes Relative: 16 %
Lymphs Abs: 0.9 10*3/uL (ref 0.7–4.0)
MCH: 30.3 pg (ref 26.0–34.0)
MCHC: 32.6 g/dL (ref 30.0–36.0)
MCV: 92.7 fL (ref 80.0–100.0)
Monocytes Absolute: 0.5 10*3/uL (ref 0.1–1.0)
Monocytes Relative: 8 %
Neutro Abs: 4.2 10*3/uL (ref 1.7–7.7)
Neutrophils Relative %: 72 %
Platelets: 184 10*3/uL (ref 150–400)
RBC: 2.61 MIL/uL — ABNORMAL LOW (ref 4.22–5.81)
RDW: 16.4 % — ABNORMAL HIGH (ref 11.5–15.5)
WBC: 5.7 10*3/uL (ref 4.0–10.5)
nRBC: 0 % (ref 0.0–0.2)

## 2019-05-07 LAB — BASIC METABOLIC PANEL
Anion gap: 11 (ref 5–15)
BUN: 17 mg/dL (ref 8–23)
CO2: 26 mmol/L (ref 22–32)
Calcium: 8 mg/dL — ABNORMAL LOW (ref 8.9–10.3)
Chloride: 98 mmol/L (ref 98–111)
Creatinine, Ser: 4.84 mg/dL — ABNORMAL HIGH (ref 0.61–1.24)
GFR calc Af Amer: 13 mL/min — ABNORMAL LOW (ref >60)
GFR calc non Af Amer: 11 mL/min — ABNORMAL LOW (ref >60)
Glucose, Bld: 129 mg/dL — ABNORMAL HIGH (ref 70–99)
Potassium: 3.7 mmol/L (ref 3.5–5.1)
Sodium: 135 mmol/L (ref 135–145)

## 2019-05-07 LAB — GLUCOSE, CAPILLARY
Glucose-Capillary: 112 mg/dL — ABNORMAL HIGH (ref 70–99)
Glucose-Capillary: 265 mg/dL — ABNORMAL HIGH (ref 70–99)

## 2019-05-07 MED ORDER — ATORVASTATIN CALCIUM 80 MG PO TABS: 80.0000 mg | ORAL_TABLET | Freq: Every day | ORAL | Status: DC

## 2019-05-07 MED ORDER — INSULIN LISPRO (1 UNIT DIAL) 100 UNIT/ML (KWIKPEN): 0.0000 [IU] | PEN_INJECTOR | SUBCUTANEOUS | Status: DC

## 2019-05-07 NOTE — Plan of Care (Signed)
  Problem: Activity: Goal: Risk for activity intolerance will decrease Outcome: Adequate for Discharge   

## 2019-05-07 NOTE — Progress Notes (Signed)
DISCHARGE NOTE SNF [facility] to be discharged Skilled nursing facility per MD order. Patient verbalized understanding.  Skin clean, dry and intact without evidence of skin break down.  IV catheter discontinued intact. Site without signs and symptoms of complications. Dressing and pressure applied. Pt denies pain at the site currently. No complaints noted.  Patient free of lines, drains, and wounds.   Discharge packet assembled. An After Visit Summary (AVS) was printed and given to the EMS personnel. Patient escorted via stretcher and discharged to [facility] via ambulance. Report called to accepting facility; talked to Maudie Mercury, LPN all questions and concerns addressed.   Paulla Fore, RN, BSN

## 2019-05-07 NOTE — Discharge Summary (Signed)
Physician Discharge Summary  Kyle [name] QPY:[id number] DOB: 12-Sep-1950 DOA: 04/29/2019  PCP: Idelle Crouch, MD  Admit date: 04/29/2019 Discharge date: 05/07/2019  Admitted From: SNF Disposition: SNF  Recommendations for Outpatient Follow-up:  1. Follow up with PCP in 1 week with repeat CBC/BMP 2. Outpatient follow-up with GI 3. Outpatient follow-up with dialysis as scheduled 4. Follow up in ED if symptoms worsen or new appear   Home Health: No Equipment/Devices: None  Discharge Condition: Stable CODE STATUS: Full Diet recommendation: Heart healthy/renal hemodialysis diet  Brief/Interim Summary: 69 year old male with history of ESRD on hemodialysis, unspecified stroke in January 2021 with residual right-sided weakness/left-sided ptosis with 3rd cranial nerve palsy, diabetes mellitus type 2 insulin-dependent, hypertension, hyperlipidemia, hypothyroidism, seizure disorder, recent admission for GI bleeding from 04/04/2019-04/16/2019 with subsequent EGD showing gastritis and mucosal changes in the duodenum and colonoscopy which showed single nonbleeding colonic angiodysplastic lesion treated with a monopolar probe presented for evaluation of low hemoglobin noted on outside labs.  In the ED hemoglobin was 6.6.  Patient was started on IV Protonix; GI was consulted.  He received packed red cells transfusion.  Nephrology was also consulted.  Capsule endoscopy showed several areas of minimal bleeding in the proximal small bowel.  He underwent small bowel push enteroscopy on 05/05/2019 which showed a single nonbleeding AVM which was treated with APC.  Subsequently Plavix was restarted and hemoglobin has remained stable.  PT recommended SNF placement.  He will be discharged to SNF once bed is available.   Discharge Diagnoses:  Acute on chronic blood loss anemia/GI bleeding -Patient had recent admission for GI bleeding and underwent EGD on 04/05/2019 which had shown gastritis with mucosal changes in  the duodenum followed by colonoscopy on 04/09/2019 showing a single nonbleeding colonic angiodysplastic lesion status post monopolar probe treatment and diverticulosis throughout the colon -Presented with melanotic stools with 6.6 hemoglobin -Status post 2 units packed red cell transfusion on presentation -Initially started on Protonix drip -Capsule endoscopy showed several areas of minimal bleeding in the proximal small bowel.  He underwent small bowel push enteroscopy on 05/05/2019 which showed a single nonbleeding AVM which was treated with APC.  GI signed off on 05/05/2019 and recommended that Plavix could be restarted from 05/06/2019.  Outpatient follow-up with GI -No further black or bloody stools.  Hemoglobin 7.9 today. -Currently on oral Protonix twice a day -Discharge to SNF once bed is available.  Outpatient follow-up of CBC.  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  -Plavix restarted on 05/06/2019 as per GI recommendations.  Outpatient follow-up with neurology  End-stage renal disease on hemodialysis -Nephrology following.  Dialysis as per nephrology schedule.  Outpatient follow-up with dialysis as scheduled  Orthostatic hypotension -Patient had episodes of orthostatic hypotension during the hospitalization requiring saline bolus and oral midodrine. -Subsequently blood pressure is improving.  IV fluids has been discontinued. -Norvasc also has been discontinued  Diabetes mellitus type 2 -A1c 7.1 on 03/17/2019 -Hyperlipidemia.  Outpatient follow-up.  Hypertension -Continue Toprol-XL.  Norvasc and losartan on hold due to orthostatic hypotension.    Outpatient follow-up.  Hypothyroidism--continue Synthroid  Seizure disorder -Continue  Keppra  Generalized conditioning -Patient from a SNF.  PT recommending SNF.   -Palliative care evaluation for goals of care discussion is pending. -This can  happen as an outpatient  Discharge Instructions  Discharge Instructions    Diet - low sodium heart healthy   Complete by: As directed    Increase activity slowly   Complete by: As directed      Allergies as of 05/07/2019   No Known Allergies     Medication List    STOP taking these medications   acetaminophen 500 MG tablet Commonly known as: TYLENOL   amLODipine 2.5 MG tablet Commonly known as: NORVASC   Biofreeze 4 % Gel Generic drug: Menthol (Topical Analgesic)   feeding supplement (NEPRO CARB STEADY) Liqd   losartan 50 MG tablet Commonly known as: COZAAR     TAKE these medications   atorvastatin 80 MG tablet Commonly known as: Lipitor Take 1 tablet (80 mg total) by mouth at bedtime.   calcium acetate 667 MG capsule Commonly known as: PHOSLO Take 2 capsules (1,334 mg total) by mouth 3 (three) times daily with meals.   clopidogrel 75 MG tablet Commonly known as: PLAVIX Take 1 tablet (75 mg total) by mouth daily.   FerrouSul 325 (65 FE) MG tablet Generic drug: ferrous sulfate Take 325 mg by mouth daily with breakfast.   gentamicin cream 0.1 % Commonly known as: GARAMYCIN Apply 1 application topically daily. What changed:   when to take this  additional instructions   insulin lispro 100 UNIT/ML KwikPen Commonly known as: HUMALOG Inject 0-0.12 mLs (0-12 Units total) into the skin See admin instructions. Inject 0-12 units into the skin three times a day before meals, depending on BGL: <70 = CALL MD; 70-200 = give nothing; 201-250 = 2 units; 251-300 = 4 units; 301-350 = 6 units; 351-400 = 8 units; 401-450 = 10 units; 451-600 = 12 units   levETIRAcetam 500 MG tablet Commonly known as: KEPPRA Take 500 mg by mouth 2 (two) times a day.   levothyroxine 200 MCG tablet Commonly known as:  SYNTHROID Take 200 mcg by mouth every morning.   metoprolol succinate 50 MG 24 hr tablet Commonly known as: TOPROL-XL Take 1 tablet (50 mg total) by mouth daily. Take with or immediately following a meal.   NEPHRO-VITE PO Take 1 tablet by mouth daily. What changed: Another medication with the same name was removed. Continue taking this medication, and follow the directions you see here.   nicotine 14 mg/24hr patch Commonly known as: NICODERM CQ - dosed in mg/24 hours Place 14 mg onto the skin daily.   Pancrelipase (Lip-Prot-Amyl) 24000-76000 units Cpep Take 1 capsule by mouth 3 (three) times daily.   pantoprazole 40 MG tablet Commonly known as: PROTONIX Take 1 tablet (40 mg total) by mouth 2 (two) times daily.   polyethylene glycol powder 17 GM/SCOOP powder Commonly known as: GLYCOLAX/MIRALAX Take 17 g by mouth daily.      Follow-up Information    Idelle Crouch, MD. Schedule an appointment as soon as possible for a visit in 1 week(s).   Specialty: Internal Medicine Why: With repeat CBC/BMP Contact information: [place] [address] [phone]        Ronnette Juniper, MD Follow up in 2 week(s).   Specialty: Gastroenterology Contact information: [place] [address] [phone]          No Known Allergies  Consultations:  GI/nephrology/palliative care   Procedures/Studies: CT HEAD WO CONTRAST  Result Date: 04/07/2019 CLINICAL DATA:  New onset neuro changes. Clinical suspicion for acute stroke. EXAM: CT HEAD  WITHOUT CONTRAST TECHNIQUE: Contiguous axial images were obtained from the base of the skull through the vertex without intravenous contrast. COMPARISON:  03/26/2019. FINDINGS: Brain: The low density the previously demonstrated at the site of recent infarction in the left paramedian midbrain is less prominent. 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 No CT evidence of acute infarction. No  intracranial hemorrhage or mass lesion. Vascular: No hyperdense vessel or unexpected calcification. Skull: Normal. Negative for fracture or focal lesion. Sinuses/Orbits: Stable mild dysconjugate gaze. Stable chronic soft tissue and calcific density in the left maxillary sinus, suggesting fungal sinusitis. Other: None. IMPRESSION: 1. No acute abnormality. 2. Expected evolution of the previously demonstrated left paramedian midbrain infarction. 3. 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 4. 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 Electronically Signed   By: Claudie Revering M.D.   On: 04/07/2019 12:25   MR BRAIN WO CONTRAST  Result Date: 04/08/2019 CLINICAL DATA:  Encephalopathy. EXAM: MRI HEAD WITHOUT CONTRAST TECHNIQUE: Multiplanar, multiecho pulse sequences of the brain and surrounding structures were obtained without intravenous contrast. COMPARISON:  Head CT 04/07/2019 and MRI 03/15/2019 FINDINGS: Brain: There is only minimal residual diffusion signal abnormality in the left paramedian midbrain corresponding to the acute infarct on last month's MRI. No acute infarct, mass, midline shift, or extra-axial fluid collection is identified. Small chronic infarcts are again noted in the left paracentral pons, cerebellum, left thalamus, and white matter adjacent to the right frontal horn. T2 hyperintensities elsewhere in the cerebral white matter bilaterally are unchanged and nonspecific but compatible with moderate chronic small vessel ischemic disease. There is moderate cerebral atrophy. Several scattered chronic microhemorrhages are again noted in the cerebrum and cerebellum. Vascular: Major intracranial vascular flow voids are preserved. Skull and upper cervical spine: Unremarkable bone marrow signal. Sinuses/Orbits: Unremarkable orbits. Near complete opacification of the left maxillary sinus by complex material. No significant mastoid fluid. Other: None. IMPRESSION: 1. No acute intracranial abnormality. 2.  Expected evolution of midbrain infarct since 03/15/2019. 3. Chronic small vessel ischemic disease with multiple old infarcts as above. Electronically Signed   By: Logan Bores M.D.   On: 04/08/2019 15:17   DG CHEST PORT 1 VIEW  Result Date: 05/02/2019 CLINICAL DATA:  Cough. EXAM: PORTABLE CHEST 1 VIEW COMPARISON:  04/08/2019 FINDINGS: Mildly enlarged cardiac silhouette without significant change. Clear lungs with normal vascularity. Stable right jugular double-lumen catheter with its tip in the superior vena cava. Thoracic spine degenerative changes. Displaced right posterior 5th rib fracture and additional probable mildly displaced posterior rib fractures on the right. There is also a mildly displaced right lateral 6th rib fracture and additional healed or healing right rib fractures laterally. Old, healed 6th posterior rib fracture thoracic spine degenerative changes. Upper mediastinal surgical clips. Surgical clips at the base of the neck on the right. IMPRESSION: 1. No acute abnormality. 2. Multiple right rib fractures, as described above. 3. Stable mild cardiomegaly. Electronically Signed   By: Claudie Revering M.D.   On: 05/02/2019 15:35   DG CHEST PORT 1 VIEW  Result Date: 04/08/2019 CLINICAL DATA:  Shortness of breath EXAM: PORTABLE CHEST 1 VIEW COMPARISON:  03/29/2018 FINDINGS: Cardiac shadow is mildly enlarged but stable. Dialysis catheter is again seen in satisfactory position. The lungs are clear bilaterally. Old rib fracture with healing on the left is seen. IMPRESSION: No acute abnormality noted. Electronically Signed   By: [name] M.D.   On: 04/08/2019 12:40   EEG adult  Result Date: 04/08/2019 Lora Havens, MD  04/08/2019  5:22 PM Patient Name: Kyle [name] MRN: [id number] Epilepsy Attending: Lora Havens Referring Physician/Provider: Dr Baird Kay Date: 04/08/2019 Duration: 22.44 mins Patient history: 69 yo M with multiple chronic infarcts in left midbrain, left pons,  cerebellum, left thalamus and epilepsy who presented with ams. EEG to evaluate for seizure. Level of alertness: awake, asleep AEDs during EEG study: LEV Technical aspects: This EEG study was done with scalp electrodes positioned according to the 10-20 International system of electrode placement. Electrical activity was acquired at a sampling rate of 500Hz  and reviewed with a high frequency filter of 70Hz  and a low frequency filter of 1Hz . EEG data were recorded continuously and digitally stored. DESCRIPTION: The posterior dominant rhythm consists of 7.5Hz  activity of moderate voltage (25-35 uV) seen predominantly in posterior head regions, symmetric and reactive to eye opening and eye closing. Sleep was characterized by vertex waves.  Hyperventilation and photic stimulation were not performed. IMPRESSION: This study is within normal limits. No seizures or epileptiform discharges were seen throughout the recording. Priyanka O Yadav    Capsule endoscopy  Small bowel push enteroscopy on 05/05/2019 Findings: Normal-appearing esophagus. Erythematous mucosa in cardia, fundus, body, antrum and pylorus. Nodular and inflamed mucosa noted in duodenal bulb.  Large submucosal mass, possibly a lipoma found in second portion of the duodenum. A single nonbleeding AVM found on the surface of the large submucosal lesion,treated with APC.  Significant looping and intestinal spasms was attempted. Examined portion of the proximal jejunum appeared unremarkable. There was no evidence of recentoractive bleeding.   Subjective: Patient seen and examined at bedside.  He denies any overnight black or bloody stools.  Feels okay to be discharged today.  No overnight fever or vomiting.  Discharge Exam: Vitals:   05/06/19 2034 05/07/19 0438  BP: (!) 137/56 124/62  Pulse: 68 63  Resp: 18 18  Temp: 98.9 F (37.2 C) 97.8 F (36.6 C)  SpO2: 98% 97%    General: Pt is alert, awake, not in acute distress.  Poor  historian.  Chronically ill looking. Cardiovascular: rate controlled, S1/S2 + Respiratory: bilateral decreased breath sounds at bases with some scattered crackles Abdominal: Soft, NT, ND, bowel sounds + Extremities: Trace lower extremity edema, no cyanosis    The results of significant diagnostics from this hospitalization (including imaging, microbiology, ancillary and laboratory) are listed below for reference.     Microbiology: Recent Results (from the past 240 hour(s))  SARS CORONAVIRUS 2 (TAT 6-24 HRS) Nasopharyngeal Nasopharyngeal Swab     Status: None   Collection Time: 04/30/19 11:48 AM   Specimen: Nasopharyngeal Swab  Result Value Ref Range Status   SARS Coronavirus 2 NEGATIVE NEGATIVE Final    Comment: (NOTE) SARS-CoV-2 target nucleic acids are NOT DETECTED. The SARS-CoV-2 RNA is generally detectable in upper and lower respiratory specimens during the acute phase of infection. Negative results do not preclude SARS-CoV-2 infection, do not rule out co-infections with other pathogens, and should not be used as the sole basis for treatment or other patient management decisions. Negative results must be combined with clinical observations, patient history, and epidemiological information. The expected result is Negative. Fact Sheet for Patients: SugarRoll.be Fact Sheet for Healthcare Providers: https://www.woods-mathews.com/ This test is not yet approved or cleared by the [place] FDA and  has been authorized for detection and/or diagnosis of SARS-CoV-2 by FDA under an Emergency Use Authorization (EUA). This EUA will remain  in effect (meaning this test can be used) for the duration of the  COVID-19 declaration under Section 56 4(b)(1) of the Act, 21 U.S.C. section 360bbb-3(b)(1), unless the authorization is terminated or revoked sooner. Performed at [facility], [place] [address], [place], [place] [address]   SARS  CORONAVIRUS 2 (TAT 6-24 HRS) Nasopharyngeal Nasopharyngeal Swab     Status: None   Collection Time: 05/06/19  2:13 PM   Specimen: Nasopharyngeal Swab  Result Value Ref Range Status   SARS Coronavirus 2 NEGATIVE NEGATIVE Final    Comment: (NOTE) SARS-CoV-2 target nucleic acids are NOT DETECTED. The SARS-CoV-2 RNA is generally detectable in upper and lower respiratory specimens during the acute phase of infection. Negative results do not preclude SARS-CoV-2 infection, do not rule out co-infections with other pathogens, and should not be used as the sole basis for treatment or other patient management decisions. Negative results must be combined with clinical observations, patient history, and epidemiological information. The expected result is Negative. Fact Sheet for Patients: SugarRoll.be Fact Sheet for Healthcare Providers: https://www.woods-mathews.com/ This test is not yet approved or cleared by the [place] FDA and  has been authorized for detection and/or diagnosis of SARS-CoV-2 by FDA under an Emergency Use Authorization (EUA). This EUA will remain  in effect (meaning this test can be used) for the duration of the COVID-19 declaration under Section 56 4(b)(1) of the Act, 21 U.S.C. section 360bbb-3(b)(1), unless the authorization is terminated or revoked sooner. Performed at [facility], [place] [address] [place], [place] [address]      Labs: BNP (last 3 results) No results for input(s): BNP in the last 8760 hours. Basic Metabolic Panel: Recent Labs  Lab 05/03/19 0402 05/04/19 0726 05/05/19 0718 05/06/19 0341 05/07/19 0355  NA 139 141 138 137 135  K 3.7 4.0 3.8 4.7 3.7  CL 100 101 100 102 98  CO2 25 25 26  21* 26  GLUCOSE 158* 141* 127* 101* 129*  BUN 23 38* 16 29* 17  CREATININE 6.44* 8.49* 5.46* 7.12* 4.84*  CALCIUM 7.7* 7.2* 7.5* 8.1* 8.0*  MG  --   --  1.4* 2.3  --   PHOS 2.1* 3.0 2.7 4.1  --    Liver  Function Tests: Recent Labs  Lab 05/03/19 0402 05/04/19 0726 05/05/19 0718 05/06/19 0341  ALBUMIN 2.3* 2.2* 2.4* 2.4*   No results for input(s): LIPASE, AMYLASE in the last 168 hours. No results for input(s): AMMONIA in the last 168 hours. CBC: Recent Labs  Lab 05/02/19 0556 05/02/19 1430 05/03/19 0402 05/04/19 0725 05/05/19 0718 05/06/19 0341 05/07/19 0355  WBC 6.4  --  7.2 7.1  --  7.7 5.7  NEUTROABS  --   --  5.0 5.2  --   --  4.2  HGB 8.9*   < > 8.8* 8.0* 8.6* 8.5* 7.9*  HCT 27.0*   < > 26.7* 25.3* 26.8* 25.7* 24.2*  MCV 93.1  --  94.0 97.3  --  94.1 92.7  PLT 235  --  245 215  --  203 184   < > = values in this interval not displayed.   Cardiac Enzymes: No results for input(s): CKTOTAL, CKMB, CKMBINDEX, TROPONINI in the last 168 hours. BNP: Invalid input(s): POCBNP CBG: Recent Labs  Lab 05/06/19 0625 05/06/19 1121 05/06/19 1704 05/06/19 2033 05/07/19 0721  GLUCAP 93 89 203* 196* 112*   D-Dimer No results for input(s): DDIMER in the last 72 hours. Hgb A1c No results for input(s): HGBA1C in the last 72 hours. Lipid Profile No results for input(s): CHOL,  HDL, LDLCALC, TRIG, CHOLHDL, LDLDIRECT in the last 72 hours. Thyroid function studies No results for input(s): TSH, T4TOTAL, T3FREE, THYROIDAB in the last 72 hours.  Invalid input(s): FREET3 Anemia work up No results for input(s): VITAMINB12, FOLATE, FERRITIN, TIBC, IRON, RETICCTPCT in the last 72 hours. Urinalysis    Component Value Date/Time   COLORURINE YELLOW 03/22/2019 1622   APPEARANCEUR CLEAR 03/22/2019 1622   APPEARANCEUR Clear 08/08/2013 1947   LABSPEC 1.018 03/22/2019 1622   LABSPEC 1.012 08/08/2013 1947   PHURINE 5.0 03/22/2019 1622   GLUCOSEU >=500 (A) 03/22/2019 1622   GLUCOSEU 50 mg/dL 08/08/2013 1947   HGBUR NEGATIVE 03/22/2019 1622   BILIRUBINUR NEGATIVE 03/22/2019 1622   BILIRUBINUR Negative 08/08/2013 1947   KETONESUR NEGATIVE 03/22/2019 1622   PROTEINUR >=300 (A) 03/22/2019  1622   NITRITE NEGATIVE 03/22/2019 1622   LEUKOCYTESUR NEGATIVE 03/22/2019 1622   LEUKOCYTESUR Negative 08/08/2013 1947   Sepsis Labs Invalid input(s): PROCALCITONIN,  WBC,  LACTICIDVEN Microbiology Recent Results (from the past 240 hour(s))  SARS CORONAVIRUS 2 (TAT 6-24 HRS) Nasopharyngeal Nasopharyngeal Swab     Status: None   Collection Time: 04/30/19 11:48 AM   Specimen: Nasopharyngeal Swab  Result Value Ref Range Status   SARS Coronavirus 2 NEGATIVE NEGATIVE Final    Comment: (NOTE) SARS-CoV-2 target nucleic acids are NOT DETECTED. The SARS-CoV-2 RNA is generally detectable in upper and lower respiratory specimens during the acute phase of infection. Negative results do not preclude SARS-CoV-2 infection, do not rule out co-infections with other pathogens, and should not be used as the sole basis for treatment or other patient management decisions. Negative results must be combined with clinical observations, patient history, and epidemiological information. The expected result is Negative. Fact Sheet for Patients: SugarRoll.be Fact Sheet for Healthcare Providers: https://www.woods-mathews.com/ This test is not yet approved or cleared by the [place] FDA and  has been authorized for detection and/or diagnosis of SARS-CoV-2 by FDA under an Emergency Use Authorization (EUA). This EUA will remain  in effect (meaning this test can be used) for the duration of the COVID-19 declaration under Section 56 4(b)(1) of the Act, 21 U.S.C. section 360bbb-3(b)(1), unless the authorization is terminated or revoked sooner. Performed at [facility], [place] [address] [place], [place] [address]   SARS CORONAVIRUS 2 (TAT 6-24 HRS) Nasopharyngeal Nasopharyngeal Swab     Status: None   Collection Time: 05/06/19  2:13 PM   Specimen: Nasopharyngeal Swab  Result Value Ref Range Status   SARS Coronavirus 2 NEGATIVE NEGATIVE Final    Comment:  (NOTE) SARS-CoV-2 target nucleic acids are NOT DETECTED. The SARS-CoV-2 RNA is generally detectable in upper and lower respiratory specimens during the acute phase of infection. Negative results do not preclude SARS-CoV-2 infection, do not rule out co-infections with other pathogens, and should not be used as the sole basis for treatment or other patient management decisions. Negative results must be combined with clinical observations, patient history, and epidemiological information. The expected result is Negative. Fact Sheet for Patients: SugarRoll.be Fact Sheet for Healthcare Providers: https://www.woods-mathews.com/ This test is not yet approved or cleared by the [place] FDA and  has been authorized for detection and/or diagnosis of SARS-CoV-2 by FDA under an Emergency Use Authorization (EUA). This EUA will remain  in effect (meaning this test can be used) for the duration of the COVID-19 declaration under Section 56 4(b)(1) of the Act, 21 U.S.C. section 360bbb-3(b)(1), unless the authorization is terminated or revoked sooner. Performed at [facility], [place] [address], [place], [place] [address]      Time coordinating discharge: 35 minutes  SIGNED:   Aline August, MD  [facility] 05/07/2019, 9:16 AM

## 2019-05-07 NOTE — TOC Transition Note (Signed)
Transition of Care (TOC) - CM/SW Discharge Note *Discharged to [facility] via ambulance *Number for report - [phone] *Patient Room Number - 709    Patient Details  Name: Kyle [name] MRN: [id number] Date of Birth: [birth year]  Transition of Care [facility]) CM/SW Contact:  Sable Feil, LCSW Phone Number: 05/07/2019, 10:36 AM   Clinical Narrative:  Patient medically stable for discharge and COVID test resulted 3/11. Facility admissions director Tammy contacted re: COVID results and d/c clinicals transmitted via EPIC. Mrs. Marro contacted regarding discharge and ambulance transport. Nurse provided with information to call report.    Final next level of care: Skilled Nursing Facility([facility]) Barriers to Discharge: Barriers Resolved   Patient Goals and CMS Choice Patient states their goals for this hospitalization and ongoing recovery are:: Wife in agreement with rehab so that patient can get stronger before returning home CMS Medicare.gov Compare Post Acute Care list provided to:: Patient Represenative (must comment)(Wife informed re: [email or web address]) Choice offered to / list presented to : Spouse  Discharge Placement PASRR number recieved: 04/10/19            Patient chooses bed at: [facility] Patient to be transferred to facility by: [place] Name of family member notified: Aryan Sparks, wife Patient and family notified of of transfer: 05/07/19  Discharge Plan and Services  SNF - [facility]                                   Social Determinants of Health (SDOH) Interventions  No SDOH interventions requested or needed at discharge   Readmission Risk Interventions No flowsheet data found.

## 2019-05-07 NOTE — Progress Notes (Signed)
Occupational Therapy Treatment Patient Details Name: Braxxton Stoudt MRN: [id number] DOB: Sep 19, 1950 Today's Date: 05/07/2019    History of present illness Patient is a 69 y/o male who presents from [facility] for evaluation of low hemoglobin. Recently hospitalized from 04/04/2019-04/16/2019 for GI bleed. Admitted with Acute on chronic blood loss anemia. Currently undergoing capsule endoscopy 3/5. PMH includes seizures, HTN, HLD, DM, CVA, depression, COPD, CKD, anxiety.   OT comments  Pt progressing with OT goals. Pt received in bed and agreeable to participate with therapy. Pt Mod A for supine to sitting EOB with assistance needed to advance trunk. Pt demo good sitting balance EOB. Pt Min A for initial sit to stand with RW with R lateral lean noted in standing. Pt Mod A + 2 for stand pivot transfer with RW, cues for safe sequencing and pacing to recliner chair. Pt setup for brushing hair sitting in recliner. Pt Max A to don ted hose and socks. Pt inquired about exercises to improve B LE strength and balance. Provided demonstration of various B LE exercises to complete sitting and supine in bed with pt able to return demonstrate with minimal cues. Pt reports blurry vision, unable to read HEP that was previously given to pt. Time spent assessing functional vision and providing general exercises that pt can complete to improve scanning and perception during ADLs. Pt continues to demonstrate orthostatic BP readings with 110/54 sitting and 89/45 in standing. Will continue to follow acutely.    Follow Up Recommendations  SNF    Equipment Recommendations  None recommended by OT    Recommendations for Other Services      Precautions / Restrictions Precautions Precautions: Fall;Other (comment)(L eye low vision ) Restrictions Weight Bearing Restrictions: No       Mobility Bed Mobility Overal bed mobility: Needs Assistance Bed Mobility: Supine to Sit     Supine to sit: Mod assist      General bed mobility comments: Pt Mod A to sit EOB with assistance nedeed for trunk stability   Transfers Overall transfer level: Needs assistance Equipment used: Rolling walker (2 wheeled) Transfers: Sit to/from [facility] Sit to Stand: Min assist Stand pivot transfers: Mod assist;+2 physical assistance       General transfer comment: Min A for sit to stand from bed with RW, Mod A + 2 for transfer to ensure safety due to R sided weaknes     Balance Overall balance assessment: Needs assistance Sitting-balance support: Feet supported;Single extremity supported Sitting balance-Leahy Scale: Fair     Standing balance support: During functional activity;Bilateral upper extremity supported Standing balance-Leahy Scale: Poor Standing balance comment: Min A for maintaining static standing balance, Mod A during dynamic                            ADL either performed or assessed with clinical judgement   ADL Overall ADL's : Needs assistance/impaired     Grooming: Set up;Brushing hair;Sitting               Lower Body Dressing: Maximal assistance;Sitting/lateral leans Lower Body Dressing Details (indicate cue type and reason): Max A to don socks                General ADL Comments: Pt with improved sitting balance EOB, still with R lateral lean in standing. Pt presents with difficulty in functional abilities secondary to visual deficits (from CVA and cataract), as well as decreased safety awareness  Vision   Vision Assessment?: Vision impaired- to be further tested in functional context Additional Comments: Pt with decreased ability to track with L eye, peripheral vision, crossing midline, and depth perception. Pt reports vision blurry    Perception     Praxis      Cognition Arousal/Alertness: Awake/alert Behavior During Therapy: WFL for tasks assessed/performed Overall Cognitive Status: No family/caregiver present to determine  baseline cognitive functioning                           Safety/Judgement: Decreased awareness of safety     General Comments: Poor insight into functional deficits. Appears confused with timeline of last 2 months. A&Ox4.         Exercises Exercises: Other exercises Other Exercises Other Exercises: Visual tracking exercises Other Exercises: ankle pumps Other Exercises: seated leg raises Other Exercises: seated marches Other Exercises: supine bed exercises for B LE    Shoulder Instructions       General Comments VSS    Pertinent Vitals/ Pain       Pain Assessment: Faces Faces Pain Scale: Hurts a little bit Pain Location: ribs  Pain Descriptors / Indicators: Aching;Discomfort;Moaning Pain Intervention(s): Monitored during session  Home Living                                          Prior Functioning/Environment              Frequency  Min 2X/week        Progress Toward Goals  OT Goals(current goals can now be found in the care plan section)  Progress towards OT goals: Progressing toward goals  Acute Rehab OT Goals Patient Stated Goal: Pt. goal is to go back home OT Goal Formulation: With patient Time For Goal Achievement: 05/16/19 Potential to Achieve Goals: Good ADL Goals Pt Will Perform Grooming: standing;with supervision Pt Will Perform Upper Body Bathing: sitting;with supervision Pt Will Perform Lower Body Bathing: with supervision;sit to/from stand Pt Will Perform Upper Body Dressing: sitting;with supervision Pt Will Perform Lower Body Dressing: with supervision;sit to/from stand Pt Will Transfer to Toilet: with supervision;ambulating  Plan Discharge plan remains appropriate    Co-evaluation                 AM-PAC OT "6 Clicks" Daily Activity     Outcome Measure   Help from another person eating meals?: A Little Help from another person taking care of personal grooming?: A Little Help from another person  toileting, which includes using toliet, bedpan, or urinal?: A Lot Help from another person bathing (including washing, rinsing, drying)?: A Lot Help from another person to put on and taking off regular upper body clothing?: A Little Help from another person to put on and taking off regular lower body clothing?: A Lot 6 Click Score: 15    End of Session Equipment Utilized During Treatment: Gait belt;Rolling walker  OT Visit Diagnosis: Unsteadiness on feet (R26.81);Dizziness and giddiness (R42)   Activity Tolerance Patient tolerated treatment well   Patient Left in chair;with call bell/phone within reach;with chair alarm set   Nurse Communication          Time: 5701-7793 OT Time Calculation (min): 47 min  Charges: OT General Charges $OT Visit: 1 Visit OT Treatments $Self Care/Home Management : 8-22 mins $Therapeutic Activity: 23-37 mins  Layla Maw, OTR/L  Layla Maw 05/07/2019, 1:37 PM

## 2019-05-07 NOTE — Progress Notes (Signed)
Discharge information faxed to OP HD clinic/[facility]. Patient will start on Saturday, 05/09/19.  Alphonzo Cruise, [place] [facility] Navigator [phone]

## 2019-05-11 ENCOUNTER — Other Ambulatory Visit: Payer: Self-pay | Admitting: *Deleted

## 2019-05-11 DIAGNOSIS — N186 End stage renal disease: Secondary | ICD-10-CM

## 2019-05-12 ENCOUNTER — Ambulatory Visit (HOSPITAL_COMMUNITY): Payer: Medicare PPO

## 2019-05-20 ENCOUNTER — Other Ambulatory Visit: Payer: Self-pay

## 2019-05-20 ENCOUNTER — Non-Acute Institutional Stay: Payer: Medicare PPO | Admitting: Primary Care

## 2019-05-20 DIAGNOSIS — Z515 Encounter for palliative care: Secondary | ICD-10-CM

## 2019-05-20 NOTE — Progress Notes (Signed)
[facility] Consult Note Telephone: [phone]  Fax: [phone]    PATIENT NAME: Kyle [name] [address] [place] [address] [phone] (home)  DOB: 1950-12-26 MRN: [id number]  PRIMARY CARE PROVIDER:   Idelle Crouch, MD, [address] [place] [address] [place] PROVIDER:  Juluis Pitch, MD Juluis Pitch, [place],  [place] [address]  RESPONSIBLE PARTY:   Extended Emergency Contact Information Primary Emergency Contact: Manalo,[name] R Address: [place], [place] [address] [place] of [place] Phone: [phone] Mobile Phone: [phone] Relation: Spouse Secondary Emergency Contact: [place], [place] Home Phone: [phone] Relation: Brother   ASSESSMENT AND RECOMMENDATIONS:   1. Advance Care Planning/Goals of Care: Goals include to maximize quality of life and symptom management. I met Mr. Savastano in his SNF room. He recounted poor health since his January 2021 CVA. Prior he was doing PD at home for ESRD x 15 months. Since the CVA he has started HD with a fistula having been created. He will has PD catheter in his left abdomen. He has MOST on file with CPR attempt, limited interventions, abx and iv use but no feeding tube. His wish is to return to his home in [facility]. He states he wants to get stronger but endorses weakness now. I will f/u in several weeks with another GOC discussion.  2. Symptom Management:   Orthostasis: Endorses some, bp is WNL while supine today. I educated Re getting up slowing and sitting for several minutes as the bp drop happens about a minute after changing positions. I described he goes to 2 position changes from supine to standing. He will need supervision on rising and stand by assist. He endorses a fall yesterday whereby he slid down the wall in the bathroom. Recommend frequent bp checks  while at SNF.  Smoking cessation: Current smoker not able to smoke while in hospital and SNF. He has a nicotine patch in place currently. He endorses desire for cessation once he is back home.  3. Family /Caregiver/Community Supports: In SNF for rehab, [place] and [place]. Beginning on HD, discussed clinics as an outpatient.  4. Cognitive / Functional decline: Alert and oriented x 3, h/o depression.   5. Follow up Palliative Care Visit: Palliative care will continue to follow for goals of care clarification and symptom management. Return 2-4  weeks or prn.  I spent 35 minutes providing this consultation,  from 1300 to 1335. More than 50% of the time in this consultation was spent coordinating communication.   HISTORY OF PRESENT ILLNESS:  Kyle [name] is a 69 y.o. 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 Palliative Care was asked to follow this patient by consultation request of  Juluis Pitch, MD [address] [place],  [place] [address]  to help address advance care planning and goals of care. This is the initial visit.  CODE STATUS: Full code, limited interventions, use of abx, iv use, no feeding tube.  PPS: 30% HOSPICE ELIGIBILITY/DIAGNOSIS: TBD  PAST MEDICAL HISTORY:  Past Medical History:  Diagnosis Date  . Anxiety   . BPH (benign prostatic hyperplasia)   . Chronic kidney disease   . Colon  polyps   . COPD (chronic obstructive pulmonary disease) ([place])   . Depression   . Diabetes mellitus without complication ([place])   . Diverticulosis of colon   . Hyperlipidemia   . Hypertension   . Hypothyroidism   . Nephrolithiasis   . Seizures ([place])     SOCIAL HX:  Social History   Tobacco Use  . Smoking status: Light Tobacco Smoker    Packs/day: 1.00    Years: 40.00    Pack years: 40.00     Types: Cigarettes    Last attempt to quit: 05/25/2016    Years since quitting: 2.9  . Smokeless tobacco: Never Used  Substance Use Topics  . Alcohol use: Yes    Alcohol/week: 8.0 standard drinks    Types: 8 Cans of beer per week    Comment: beer or wine     ALLERGIES: No Known Allergies   PERTINENT MEDICATIONS:  Outpatient Encounter Medications as of 05/20/2019  Medication Sig  . atorvastatin (LIPITOR) 80 MG tablet Take 1 tablet (80 mg total) by mouth at bedtime.  . B Complex-C-Folic Acid (NEPHRO-VITE PO) Take 1 tablet by mouth daily.  . calcium acetate (PHOSLO) 667 MG capsule Take 2 capsules (1,334 mg total) by mouth 3 (three) times daily with meals.  . ferrous sulfate (FERROUSUL) 325 (65 FE) MG tablet Take 325 mg by mouth daily with breakfast.  . gentamicin cream (GARAMYCIN) 0.1 % Apply 1 application topically daily. (Patient taking differently: Apply 1 application topically See admin instructions. Apply to affected area once daily for infection)  . insulin lispro (HUMALOG) 100 UNIT/ML KwikPen Inject 0-0.12 mLs (0-12 Units total) into the skin See admin instructions. Inject 0-12 units into the skin three times a day before meals, depending on BGL: <70 = CALL MD; 70-200 = give nothing; 201-250 = 2 units; 251-300 = 4 units; 301-350 = 6 units; 351-400 = 8 units; 401-450 = 10 units; 451-600 = 12 units  . levETIRAcetam (KEPPRA) 500 MG tablet Take 500 mg by mouth 2 (two) times a day.  . levothyroxine (SYNTHROID) 200 MCG tablet Take 200 mcg by mouth every morning.  . metoprolol succinate (TOPROL-XL) 50 MG 24 hr tablet Take 1 tablet (50 mg total) by mouth daily. Take with or immediately following a meal.  . nicotine (NICODERM CQ - DOSED IN MG/24 HOURS) 14 mg/24hr patch Place 14 mg onto the skin daily.  . Pancrelipase, Lip-Prot-Amyl, 24000-76000 units CPEP Take 1 capsule by mouth 3 (three) times daily.  . pantoprazole (PROTONIX) 40 MG tablet Take 1 tablet (40 mg total) by mouth 2 (two) times daily.    . polyethylene glycol powder (GLYCOLAX/MIRALAX) 17 GM/SCOOP powder Take 17 g by mouth daily.   No facility-administered encounter medications on file as of 05/20/2019.    PHYSICAL EXAM / ROS:  138/81 afeb 70-18 Current and past weights: states 180 lb, wt in health was 240 lb.  General: NAD, frail appearing, thin HEENT: L eye palsy  Cardiovascular: no chest pain reported, no edema Pulmonary: no cough, no increased SOB, smoked in Jan but not since, room air Abdomen: appetite fair, endorses constipation, continent of bowel GU: denies dysuria, urinates every other day, HD patient  MSK:  no joint deformities, ambulatory with walker and sometimes walks alone. Reports 1 'slide" down wall.  Skin: no rashes or wounds reported, has L arm fistula.  Neurological: Weakness, denies pain, seizures in 2015. Denies insomnia.  Jason Coop, NP [facility]  COVID-19 PATIENT SCREENING TOOL  Person answering  questions: ____________Staff_______ _____   1.  Is the patient or any family member in the home showing any signs or symptoms regarding respiratory infection?               Person with Symptom- __________NA_________________  a. Fever                                                                          Yes___ No___          ___________________  b. Shortness of breath                                                    Yes___ No___          ___________________ c. Cough/congestion                                       Yes___  No___         ___________________ d. Body aches/pains                                                         Yes___ No___        ____________________ e. Gastrointestinal symptoms (diarrhea, nausea)           Yes___ No___        ____________________  2. Within the past 14 days, has anyone living in the home had any contact with someone with or under investigation for COVID-19?    Yes___ No_X_   Person __________________

## 2019-05-25 ENCOUNTER — Ambulatory Visit: Payer: Medicare PPO | Attending: Internal Medicine

## 2019-05-25 DIAGNOSIS — Z23 Encounter for immunization: Secondary | ICD-10-CM

## 2019-05-25 NOTE — Progress Notes (Signed)
   Covid-19 Vaccination Clinic  Name:  Kyle [name]    MRN: [id number] DOB: Nov 17, 1950  05/25/2019  Mr. Debold was observed post Covid-19 immunization for 15 minutes without incident. He was provided with Vaccine Information Sheet and instruction to access the V-Safe system.   Mr. Runco was instructed to call 911 with any severe reactions post vaccine: [name] Difficulty breathing  . Swelling of face and throat  . A fast heartbeat  . A bad rash all over body  . Dizziness and weakness   Immunizations Administered    Name Date Dose VIS Date Route   Pfizer COVID-19 Vaccine 05/25/2019 11:58 AM 0.3 mL 02/06/2019 Intramuscular   Manufacturer: [place]   Lot: [id number]   [place]: [address]-5

## 2019-06-05 ENCOUNTER — Non-Acute Institutional Stay: Payer: Medicare PPO | Admitting: Primary Care

## 2019-06-05 ENCOUNTER — Other Ambulatory Visit: Payer: Self-pay

## 2019-06-05 DIAGNOSIS — Z515 Encounter for palliative care: Secondary | ICD-10-CM

## 2019-06-05 NOTE — Progress Notes (Signed)
[facility] Consult Note Telephone: [phone]  Fax: [phone]    PATIENT NAME: Kyle [name] [address] [place] [address] [phone] (home)  DOB: [birth year] MRN: [id number]  PRIMARY CARE PROVIDER:  Juluis Pitch, MD [address] [place],  [place] [address] [place], [place], [place],  [place] [address]  RESPONSIBLE PARTY:   Extended Emergency Contact Information Primary Emergency Contact: Caillouet,[name] R Address: [place],  [address] [address] of [place] Phone: [phone] Mobile Phone: [phone] Relation: Spouse Secondary Emergency Contact: [place], [place] Home Phone: [phone] Relation: Brother   I spoke with Kyle [name] in his nursing home room. He is in continuing skilled days for rehab from a stroke. He has recently been converted to  hemodialysis from peritoneal dialysis.   ASSESSMENT AND RECOMMENDATIONS:   1. Advance Care Planning/Goals of Care: Goals include to maximize quality of life and symptom management. MOST on file with CPR attempt, limited interventions, abx and iv use but no feeding tube. We discussed his goals of care. He states he very much wants to work on rehab and get home. States that his wife has sold their home and they are planning to move to a one or a condominium closer into town.   2. Symptom Management:   Mobility: We discussed his ability to walk and do ADLs. He states his wife would like him more independent. We discussed how it may take up to six months to a year to fully rehab. I suggested they also plan on in-home help for when he does get home. He will also be going to hemodialysis three days a week and is going to look into [facility] transportation. We discussed safety he uses a walker with standby assist and wheelchair. He transferred into the wheelchair with just standby assist.  I also suggested  that he spend some time doing some of the exercises on his own a few more times a day in order to augment his rehab.  Nutrition: He states he's eating well. Weight is maintaining.  Scalp lesions: Infection on his scalp  has improved greatly with gentamicin application. He still feels that it is bothersome. Clinically much improved.  3. Family /Caregiver/Community Supports: In SNF for rehab, [place] and planning to return to new home in town. Goes to HD 3 x/ week.  4. Cognitive / Functional decline: Alert, oriented x 3. Functionally improving, needs some help with mobility, grooming and some help with iadls.  5. Follow up Palliative Care Visit: Palliative care will continue to follow for goals of care clarification and symptom management. Return 4-6 weeks or prn.  I spent 25 minutes providing this consultation,  from 1330 to 1355. More than 50% of the time in this consultation was spent coordinating communication.   HISTORY OF PRESENT ILLNESS:  Kyle [name] is a 69 y.o. 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 Palliative Care was asked to follow this patient by consultation request of Juluis Pitch MD to help address advance care planning and goals of care. This is a follow up visit.  CODE STATUS: Full code, limited interventions, use of abx, iv use, no feeding tube.  PPS: 40% HOSPICE ELIGIBILITY/DIAGNOSIS: TBD  PAST MEDICAL HISTORY:  Past Medical History:  Diagnosis Date  . Anxiety   . BPH (benign prostatic hyperplasia)   . Chronic kidney disease   . Colon polyps   . COPD (chronic obstructive pulmonary disease) ([place])   . Depression   . Diabetes mellitus without complication ([place])   . Diverticulosis of colon   . Hyperlipidemia   . Hypertension   . Hypothyroidism   . Nephrolithiasis   . Seizures  ([place])     SOCIAL HX:  Social History   Tobacco Use  . Smoking status: Light Tobacco Smoker    Packs/day: 1.00    Years: 40.00    Pack years: 40.00    Types: Cigarettes    Last attempt to quit: 05/25/2016    Years since quitting: 3.0  . Smokeless tobacco: Never Used  Substance Use Topics  . Alcohol use: Yes    Alcohol/week: 8.0 standard drinks    Types: 8 Cans of beer per week    Comment: beer or wine     ALLERGIES: No Known Allergies   PERTINENT MEDICATIONS:  Outpatient Encounter Medications as of 71/10/2019  Medication Sig  . Amino Acids-Protein Hydrolys (FEEDING SUPPLEMENT, PRO-STAT SUGAR FREE 64,) LIQD Take 30 mLs by mouth daily.  [name] atorvastatin (LIPITOR) 80 MG tablet Take 1 tablet (80 mg total) by mouth at bedtime.  . B Complex-C-Folic Acid (NEPHRO-VITE PO) Take 1 tablet by mouth daily.  . calcium acetate (PHOSLO) 667 MG capsule Take 2 capsules (1,334 mg total) by mouth 3 (three) times daily with meals.  . clopidogrel (PLAVIX) 75 MG tablet Take 75 mg by mouth daily.  . ferrous sulfate (FERROUSUL) 325 (65 FE) MG tablet Take 325 mg by mouth daily with breakfast.  . gentamicin cream (GARAMYCIN) 0.1 % Apply 1 application topically daily. (Patient taking differently: Apply 1 application topically See admin instructions. Apply to affected area once daily for infection)  . hydrOXYzine (ATARAX/VISTARIL) 25 MG tablet Take 25 mg by mouth every 6 (six) hours as needed for itching.  . insulin glargine (LANTUS) 100 UNIT/ML injection Inject 10 Units into the skin daily. In the morning  . insulin lispro (HUMALOG) 100 UNIT/ML KwikPen Inject 0-0.12 mLs (0-12 Units total) into the skin See admin instructions. Inject 0-12 units into the skin three times a day before meals, depending on BGL: <70 = CALL MD; 70-200 = give nothing; 201-250 = 2 units; 251-300 = 4 units; 301-350 = 6 units; 351-400 = 8 units; 401-450 = 10 units; 451-600 = 12 units  . levETIRAcetam (KEPPRA) 500 MG tablet Take 500 mg by  mouth 2 (two) times a day.  . levothyroxine (SYNTHROID) 175 MCG tablet Take 175 mcg by mouth daily before breakfast.   . lidocaine-prilocaine (EMLA) cream Apply 1 application topically. On Tues, Thursday and Sat to fistula site.  . metoprolol succinate (TOPROL-XL) 50 MG 24 hr tablet Take 1 tablet (50 mg total) by mouth daily. Take with or immediately following a meal.  . nicotine (NICODERM CQ - DOSED IN MG/24 HOURS) 14 mg/24hr patch Place 14 mg onto the skin daily.  . Pancrelipase, Lip-Prot-Amyl, 24000-76000 units CPEP Take 1 capsule by mouth 3 (three) times daily.  . pantoprazole (PROTONIX) 40 MG tablet Take 1 tablet (40 mg total) by mouth 2 (two) times daily.  . polyethylene glycol powder (GLYCOLAX/MIRALAX) 17 GM/SCOOP powder Take 17 g by mouth daily.   No facility-administered encounter medications on file as of 71/10/2019.    PHYSICAL EXAM / ROS:  Current and past weights: 183 lbs General: NAD, frail appearing, WNWD Cardiovascular: no chest pain reported, no edema Pulmonary: no cough, no increased SOB, Room air Abdomen: appetite good, denies constipation, continent of bowel GU: denies dysuria, oliguria MSK:  no joint deformities, ambulatory with  stand by and walker, using w/c to self propel Skin: scalp irritation, healing well Neurological:  R Weakness, h/o CVA, undergoing rehab. Denies pain.   Jason Coop, NP [facility]  COVID-19 PATIENT SCREENING TOOL  Person answering questions: ____________Staff_______ _____   1.  Is the patient or any family member in the home showing any signs or symptoms regarding respiratory infection?               Person with Symptom- __________NA_________________  a. Fever                                                                          Yes___ No___          ___________________  b. Shortness of breath                                                    Yes___ No___          ___________________ c. Cough/congestion                                        Yes___  No___         ___________________ d. Body aches/pains                                                         Yes___ No___        ____________________ e. Gastrointestinal symptoms (diarrhea, nausea)           Yes___ No___        ____________________  2. Within the past 14 days, has anyone living in the home had any contact with someone with or under investigation for COVID-19?    Yes___ No_X_   Person __________________

## 2019-06-15 ENCOUNTER — Ambulatory Visit: Payer: Medicare PPO | Attending: Internal Medicine

## 2019-06-15 DIAGNOSIS — Z23 Encounter for immunization: Secondary | ICD-10-CM

## 2019-06-15 NOTE — Progress Notes (Signed)
   Covid-19 Vaccination Clinic  Name:  [name]    MRN: [id number] DOB: 05/12/1950  06/15/2019  [name] was observed post Covid-19 immunization for 30 minutes based on pre-vaccination screening without incident. He was provided with Vaccine Information Sheet and instruction to access the V-Safe system.   [name] was instructed to call 911 with any severe reactions post vaccine: [name] Difficulty breathing  . Swelling of face and throat  . A fast heartbeat  . A bad rash all over body  . Dizziness and weakness   Immunizations Administered    Name Date Dose VIS Date Route   Pfizer COVID-19 Vaccine 06/15/2019 10:24 AM 0.3 mL 04/22/2018 Intramuscular   Manufacturer: [employer], [employer]   Lot: [id number]   [place]: [id number]

## 2019-06-22 ENCOUNTER — Ambulatory Visit (INDEPENDENT_AMBULATORY_CARE_PROVIDER_SITE_OTHER): Payer: Medicare PPO | Admitting: Vascular Surgery

## 2019-06-22 ENCOUNTER — Other Ambulatory Visit: Payer: Self-pay

## 2019-06-22 ENCOUNTER — Encounter (INDEPENDENT_AMBULATORY_CARE_PROVIDER_SITE_OTHER): Payer: Self-pay | Admitting: Vascular Surgery

## 2019-06-22 VITALS — BP 182/71 | HR 66 | Ht 72.0 in | Wt 183.0 lb

## 2019-06-22 DIAGNOSIS — F17200 Nicotine dependence, unspecified, uncomplicated: Secondary | ICD-10-CM | POA: Insufficient documentation

## 2019-06-22 DIAGNOSIS — N186 End stage renal disease: Secondary | ICD-10-CM | POA: Diagnosis not present

## 2019-06-22 DIAGNOSIS — I152 Hypertension secondary to endocrine disorders: Secondary | ICD-10-CM

## 2019-06-22 DIAGNOSIS — J449 Chronic obstructive pulmonary disease, unspecified: Secondary | ICD-10-CM

## 2019-06-22 DIAGNOSIS — I10 Essential (primary) hypertension: Secondary | ICD-10-CM

## 2019-06-22 DIAGNOSIS — E1159 Type 2 diabetes mellitus with other circulatory complications: Secondary | ICD-10-CM | POA: Diagnosis not present

## 2019-06-22 DIAGNOSIS — E119 Type 2 diabetes mellitus without complications: Secondary | ICD-10-CM

## 2019-06-22 DIAGNOSIS — R296 Repeated falls: Secondary | ICD-10-CM | POA: Insufficient documentation

## 2019-06-22 DIAGNOSIS — Z794 Long term (current) use of insulin: Secondary | ICD-10-CM

## 2019-06-22 DIAGNOSIS — Z992 Dependence on renal dialysis: Secondary | ICD-10-CM

## 2019-06-22 DIAGNOSIS — I739 Peripheral vascular disease, unspecified: Secondary | ICD-10-CM

## 2019-06-22 NOTE — Progress Notes (Signed)
MRN : [id number]  Kyle [name] is a 69 y.o. (June 21, 1950) male who presents with chief complaint of No chief complaint on file. [name]  History of Present Illness:    The patient is seen for evaluation of dialysis access.    Current access is via a left brachiocephalic fistula.   Right IJ catheter was placed on 03/30/2019 and is not being used.  There is also a PD catheter which is not being used.  Dr. Doy Hutching sent him for an ABI after he was unable to palpate pedal pulses.  I reviewed the ABI with the patient.  At this point he has noncompressible right lower extremity and mild disease on the left.  The patient denies claudication symptoms or rest pain symptoms.  He denies any ulcerations or infections of the feet or toes.  He has never undergone any more  invasive treatment angiography or vascular surgery.  The patient denies amaurosis fugax or recent TIA symptoms. There are no recent neurological changes noted. The patient denies history of DVT, PE or superficial thrombophlebitis. The patient denies recent episodes of angina or shortness of breath.    No outpatient medications have been marked as taking for the 06/22/19 encounter (Office Visit) with Delana Meyer, Dolores Lory, MD.    Past Medical History:  Diagnosis Date  . Anxiety   . BPH (benign prostatic hyperplasia)   . Chronic kidney disease   . Colon polyps   . COPD (chronic obstructive pulmonary disease) ([place])   . Depression   . Diabetes mellitus without complication ([place])   . Diverticulosis of colon   . Hyperlipidemia   . Hypertension   . Hypothyroidism   . Nephrolithiasis   . Seizures ([place])     Past Surgical History:  Procedure Laterality Date  . AV FISTULA PLACEMENT Left 03/30/2019   Procedure: right brachiocephalic fistula creation;  Surgeon: Marty Heck, MD;  Location: [place];  Service: Vascular;  Laterality: Left;  . BIOPSY  04/05/2019   Procedure: BIOPSY;  Surgeon: Wilford Corner, MD;  Location: [place];  Service: Endoscopy;;  . COLONOSCOPY    . COLONOSCOPY WITH PROPOFOL N/A 05/30/2015   Procedure: COLONOSCOPY WITH PROPOFOL;  Surgeon: Manya Silvas, MD;  Location: [facility] ENDOSCOPY;  Service: Endoscopy;  Laterality: N/A;  . COLONOSCOPY WITH PROPOFOL N/A 12/01/2018   Procedure: COLONOSCOPY WITH PROPOFOL;  Surgeon: Toledo, [name], MD;  Location: [facility] ENDOSCOPY;  Service: Gastroenterology;  Laterality: N/A;  . COLONOSCOPY WITH PROPOFOL N/A 04/09/2019   Procedure: COLONOSCOPY WITH PROPOFOL;  Surgeon: Carol Ada, MD;  Location: [place];  Service: Endoscopy;  Laterality: N/A;  . ENTEROSCOPY N/A 05/05/2019   Procedure: ENTEROSCOPY;  Surgeon: Ronnette Juniper, MD;  Location: [facility] ENDOSCOPY;  Service: Gastroenterology;  Laterality: N/A;  Push enteroscopy  . ESOPHAGOGASTRODUODENOSCOPY (EGD) WITH PROPOFOL N/A 04/05/2019   Procedure: ESOPHAGOGASTRODUODENOSCOPY (EGD) WITH PROPOFOL;  Surgeon: Wilford Corner, MD;  Location: [place];  Service: Endoscopy;  Laterality: N/A;  . GIVENS CAPSULE STUDY N/A 05/01/2019   Procedure: GIVENS CAPSULE STUDY;  Surgeon: Ronald Lobo, MD;  Location: [place];  Service: Endoscopy;  Laterality: N/A;  . HOT HEMOSTASIS N/A 04/09/2019   Procedure: HOT HEMOSTASIS (ARGON PLASMA COAGULATION/BICAP);  Surgeon: Carol Ada, MD;  Location: [place];  Service: Endoscopy;  Laterality: N/A;  . HOT HEMOSTASIS N/A 05/05/2019   Procedure: HOT HEMOSTASIS (ARGON PLASMA COAGULATION/BICAP);  Surgeon: Ronnette Juniper, MD;  Location: [place];  Service: Gastroenterology;  Laterality: N/A;  . INSERTION OF DIALYSIS CATHETER Right 03/30/2019  Procedure: Ultrasound guided right internal jugular tunneled dialysis catheter placement;  Surgeon: Marty Heck, MD;  Location: [facility] OR;  Service: Vascular;  Laterality: Right;  . kidney stone    . THYROIDECTOMY    . VARICOCELE EXCISION    . VIDEO BRONCHOSCOPY Bilateral 05/25/2016   Procedure: VIDEO BRONCHOSCOPY WITHOUT FLUORO;   Surgeon: Juanito Doom, MD;  Location: [facility] ENDOSCOPY;  Service: Cardiopulmonary;  Laterality: Bilateral;    Social History Social History   Tobacco Use  . Smoking status: Light Tobacco Smoker    Packs/day: 1.00    Years: 40.00    Pack years: 40.00    Types: Cigarettes    Last attempt to quit: 05/25/2016    Years since quitting: 3.0  . Smokeless tobacco: Never Used  Substance Use Topics  . Alcohol use: Yes    Alcohol/week: 8.0 standard drinks    Types: 8 Cans of beer per week    Comment: beer or wine   . Drug use: No    Family History Family History  Problem Relation Age of Onset  . Diabetes Mother   . Diabetes Maternal Grandmother   . Diabetes Maternal Grandfather   . Lung cancer Father   . Emphysema Paternal Grandfather   No family history of bleeding/clotting disorders, porphyria or autoimmune disease   No Known Allergies   REVIEW OF SYSTEMS (Negative unless checked)  Constitutional: [] Weight loss  [] Fever  [] Chills Cardiac: [] Chest pain   [] Chest pressure   [] Palpitations   [] Shortness of breath when laying flat   [] Shortness of breath with exertion. Vascular:  [] Pain in legs with walking   [] Pain in legs at rest  [] History of DVT   [] Phlebitis   [] Swelling in legs   [] Varicose veins   [] Non-healing ulcers Pulmonary:   [] Uses home oxygen   [] Productive cough   [] Hemoptysis   [] Wheeze  [x] COPD   [] Asthma Neurologic:  [] Dizziness   [] Seizures   [] History of stroke   [] History of TIA  [] Aphasia   [] Vissual changes   [] Weakness or numbness in arm   [] Weakness or numbness in leg Musculoskeletal:   [] Joint swelling   [] Joint pain   [] Low back pain Hematologic:  [x] Easy bruising  [] Easy bleeding   [] Hypercoagulable state   [] Anemic Gastrointestinal:  [] Diarrhea   [] Vomiting  [] Gastroesophageal reflux/heartburn   [] Difficulty swallowing. Genitourinary:  [x] Chronic kidney disease   [] Difficult urination  [] Frequent urination   [] Blood in urine Skin:  [] Rashes   [] Ulcers   Psychological:  [] History of anxiety   []  History of major depression.  Physical Examination  Vitals:   06/22/19 0907  BP: (!) 182/71  Pulse: 66  Weight: 183 lb (83 kg)  Height: 6' (1.829 m)   Body mass index is 24.82 kg/m. Gen: WD/WN, NAD Head: [place]/AT, No temporalis wasting.  Ear/Nose/Throat: Hearing grossly intact, nares w/o erythema or drainage, poor dentition Eyes: PER, EOMI, sclera nonicteric.  Neck: Supple, no masses.  No bruit or JVD.  Pulmonary:  Good air movement, clear to auscultation bilaterally, no use of accessory muscles.  Cardiac: RRR, normal S1, S2, no Murmurs. Vascular: Right IJ tunnel catheter and PD catheter both clean dry and intact no evidence of infection nontender Vessel Right Left  Radial Palpable Palpable  PT Not Palpable Not Palpable  DP Not Palpable Not Palpable  Gastrointestinal: soft, non-distended. No guarding/no peritoneal signs.  Musculoskeletal: M/S 5/5 throughout.  No deformity or atrophy.  Neurologic: CN 2-12 intact. Pain and light touch intact in extremities.  Symmetrical.  Speech is fluent. Motor exam as listed above. Psychiatric: Judgment intact, Mood & affect appropriate for pt's clinical situation.  CBC Lab Results  Component Value Date   WBC 5.7 05/07/2019   HGB 7.9 (L) 05/07/2019   HCT 24.2 (L) 05/07/2019   MCV 92.7 05/07/2019   PLT 184 05/07/2019    BMET    Component Value Date/Time   NA 135 05/07/2019 0355   NA 138 08/08/2013 1947   K 3.7 05/07/2019 0355   K 3.9 08/08/2013 1947   CL 98 05/07/2019 0355   CL 107 08/08/2013 1947   CO2 26 05/07/2019 0355   CO2 21 08/08/2013 1947   GLUCOSE 129 (H) 05/07/2019 0355   GLUCOSE 100 (H) 08/08/2013 1947   BUN 17 05/07/2019 0355   BUN 32 (H) 08/08/2013 1947   CREATININE 4.84 (H) 05/07/2019 0355   CREATININE 2.04 (H) 08/08/2013 1947   CALCIUM 8.0 (L) 05/07/2019 0355   CALCIUM 9.1 08/08/2013 1947   GFRNONAA 11 (L) 05/07/2019 0355   GFRNONAA 33 (L) 08/08/2013 1947   GFRAA 13  (L) 05/07/2019 0355   GFRAA 38 (L) 08/08/2013 1947   CrCl cannot be calculated (Patient's most recent lab result is older than the maximum 21 days allowed.).  COAG Lab Results  Component Value Date   INR 0.9 04/29/2019   INR 0.9 09/17/2018    Radiology No results found.   Assessment/Plan 1. ESRD (end stage renal disease) on dialysis [facility]) The patient currently has a functioning fistula which is being used for his hemodialysis access.  There are no problems with his fistula and therefore he does not need his tunneled catheter.  Furthermore, he is no longer using his PD catheter and there are no plans for future use.  Given these 2 findings both catheter should be removed.  The risk and benefits as well as alternatives were reviewed all questions answered patient has agreed to proceed.  Currently he is dialyzing Tuesday Thursday Saturday and so surgery will be arranged for a Wednesday or Friday.  2. PAD (peripheral artery disease) (HCC)  Recommend:  The patient has evidence of atherosclerosis of the lower extremities with claudication.  The patient does not voice lifestyle limiting changes at this point in time.  Noninvasive studies do not suggest clinically significant disease that would be worrisome at this time.  No invasive studies, angiography or surgery at this time The patient should continue walking and begin a more formal exercise program.  The patient should continue antiplatelet therapy and aggressive treatment of the lipid abnormalities  No changes in the patient's medications at this time   3. Hypertension associated with diabetes ([place]) Continue antihypertensive medications as already ordered, these medications have been reviewed and there are no changes at this time.   4. COPD with chronic bronchitis ([place]) Continue pulmonary medications and aerosols as already ordered, these medications have been reviewed and there are no changes at this time.    5. Insulin  dependent type 2 diabetes mellitus ([place]) Continue hypoglycemic medications as already ordered, these medications have been reviewed and there are no changes at this time.  Hgb A1C to be monitored as already arranged by primary service    Hortencia Pilar, MD  06/22/2019 9:07 AM

## 2019-06-23 ENCOUNTER — Telehealth (INDEPENDENT_AMBULATORY_CARE_PROVIDER_SITE_OTHER): Payer: Self-pay

## 2019-06-23 NOTE — Telephone Encounter (Signed)
I attempted to contact the patient to schedule surgery and a message was left for a return call.

## 2019-06-24 ENCOUNTER — Other Ambulatory Visit
Admission: RE | Admit: 2019-06-24 | Discharge: 2019-06-24 | Disposition: A | Discharge status: Home / Self Care | Payer: Medicare PPO | Source: Ambulatory Visit | Attending: Vascular Surgery | Admitting: Vascular Surgery

## 2019-06-24 ENCOUNTER — Other Ambulatory Visit (INDEPENDENT_AMBULATORY_CARE_PROVIDER_SITE_OTHER): Payer: Self-pay | Admitting: Nurse Practitioner

## 2019-06-24 DIAGNOSIS — Z20822 Contact with and (suspected) exposure to covid-19: Secondary | ICD-10-CM | POA: Insufficient documentation

## 2019-06-24 DIAGNOSIS — Z01812 Encounter for preprocedural laboratory examination: Secondary | ICD-10-CM | POA: Insufficient documentation

## 2019-06-24 LAB — SARS CORONAVIRUS 2 (TAT 6-24 HRS): SARS Coronavirus 2: NEGATIVE

## 2019-06-24 NOTE — Telephone Encounter (Signed)
Patient's wife called back and he is now scheduled with Dr. Delana Meyer for a PD cath removal and IJ cath removal on 06/26/19. Patient will do a phone call pre-op on 06/25/19 and covid testing on 4-29/21 before 12:00 pm at the [facility]. Pre-surgical instructions were discussed.

## 2019-06-25 ENCOUNTER — Other Ambulatory Visit: Payer: Self-pay

## 2019-06-25 ENCOUNTER — Encounter
Admission: RE | Admit: 2019-06-25 | Discharge: 2019-06-25 | Disposition: A | Discharge status: Home / Self Care | Payer: Medicare PPO | Source: Ambulatory Visit | Attending: Vascular Surgery | Admitting: Vascular Surgery

## 2019-06-25 ENCOUNTER — Other Ambulatory Visit: Payer: Medicare PPO

## 2019-06-25 HISTORY — DX: Personal history of urinary calculi: Z87.442

## 2019-06-25 HISTORY — DX: Anemia, unspecified: D64.9

## 2019-06-25 NOTE — Patient Instructions (Addendum)
Your procedure is scheduled on: Friday, April 30 Report to Day Surgery on the 2nd floor of the [facility]. To find out your arrival time, please call [phone] between 1PM - 3PM on: Thursday, April 29  REMEMBER: Instructions that are not followed completely may result in serious medical risk, up to and including death; or upon the discretion of your surgeon and anesthesiologist your surgery may need to be rescheduled.  Do not eat food after midnight the night before surgery.  No gum chewing, lozengers or hard candies.  You may however, drink water up to 2 hours before you are scheduled to arrive for your surgery. Do not drink anything within 2 hours of your scheduled arrival time.  TAKE THESE MEDICATIONS THE MORNING OF SURGERY WITH A SIP OF WATER:  1.  keppra 2.  Levothyroxine 3.  Metoprolol 4.  Pantoprazole - (take one the night before and one on the morning of surgery - helps to prevent nausea after surgery.)  Take 1/2 of usual insulin dose the night before surgery and none on the morning of surgery. Only take 5 units Lantus the night before  Follow recommendations from Cardiologist, Pulmonologist or PCP regarding stopping Plavix.   Stop Anti-inflammatories (NSAIDS) such as Advil, Aleve, Ibuprofen, Motrin, Naproxen, Naprosyn and Aspirin based products such as Excedrin, Goodys Powder, BC Powder. (May take Tylenol or Acetaminophen if needed.)  Stop ANY OVER THE COUNTER supplements until after surgery.  No Alcohol for 24 hours before or after surgery.  No Smoking including e-cigarettes for 24 hours prior to surgery.  No chewable tobacco products for at least 6 hours prior to surgery.  No nicotine patches on the day of surgery.  On the morning of surgery brush your teeth with toothpaste and water, you may rinse your mouth with mouthwash if you wish. Do not swallow any toothpaste or mouthwash.  Do not wear jewelry.  Do not wear lotions, powders, or perfumes.   Do not  shave 48 hours prior to surgery.   Contact lenses, hearing aids and dentures may not be worn into surgery.  Do not bring valuables to the hospital. [facility] is not responsible for any missing/lost belongings or valuables.   Notify your doctor if there is any change in your medical condition (cold, fever, infection).  Wear comfortable clothing (specific to your surgery type) to the hospital.  After surgery, you can help prevent lung complications by doing breathing exercises.  Take deep breaths and cough every 1-2 hours.   If you are being discharged the day of surgery, you will not be allowed to drive home. You will need a responsible adult (18 years or older) to drive you home and stay with you that night.   Please call the [facility] Dept. at [phone] if you have any questions about these instructions.  Visitation Policy:  Patients undergoing a surgery or procedure may have one family member or support person with them as long as that person is not COVID-19 positive or experiencing its symptoms.  That person may remain in the waiting area during the procedure.

## 2019-06-26 ENCOUNTER — Ambulatory Visit: Payer: Medicare PPO | Admitting: Anesthesiology

## 2019-06-26 ENCOUNTER — Encounter
Admission: RE | Disposition: A | Discharge status: Home / Self Care | Payer: Self-pay | Source: Home / Self Care | Attending: Vascular Surgery

## 2019-06-26 ENCOUNTER — Encounter: Payer: Self-pay | Admitting: Vascular Surgery

## 2019-06-26 ENCOUNTER — Other Ambulatory Visit: Payer: Self-pay

## 2019-06-26 ENCOUNTER — Ambulatory Visit
Admission: RE | Admit: 2019-06-26 | Discharge: 2019-06-26 | Disposition: A | Discharge status: Home / Self Care | Payer: Medicare PPO | Attending: Vascular Surgery | Admitting: Vascular Surgery

## 2019-06-26 DIAGNOSIS — E1159 Type 2 diabetes mellitus with other circulatory complications: Secondary | ICD-10-CM | POA: Diagnosis not present

## 2019-06-26 DIAGNOSIS — T82898A Other specified complication of vascular prosthetic devices, implants and grafts, initial encounter: Secondary | ICD-10-CM

## 2019-06-26 DIAGNOSIS — E890 Postprocedural hypothyroidism: Secondary | ICD-10-CM | POA: Insufficient documentation

## 2019-06-26 DIAGNOSIS — J449 Chronic obstructive pulmonary disease, unspecified: Secondary | ICD-10-CM | POA: Diagnosis not present

## 2019-06-26 DIAGNOSIS — N400 Enlarged prostate without lower urinary tract symptoms: Secondary | ICD-10-CM | POA: Insufficient documentation

## 2019-06-26 DIAGNOSIS — Z825 Family history of asthma and other chronic lower respiratory diseases: Secondary | ICD-10-CM | POA: Diagnosis not present

## 2019-06-26 DIAGNOSIS — Z794 Long term (current) use of insulin: Secondary | ICD-10-CM | POA: Diagnosis not present

## 2019-06-26 DIAGNOSIS — N186 End stage renal disease: Secondary | ICD-10-CM

## 2019-06-26 DIAGNOSIS — I739 Peripheral vascular disease, unspecified: Secondary | ICD-10-CM | POA: Diagnosis not present

## 2019-06-26 DIAGNOSIS — T859XXA Unspecified complication of internal prosthetic device, implant and graft, initial encounter: Secondary | ICD-10-CM | POA: Insufficient documentation

## 2019-06-26 DIAGNOSIS — I120 Hypertensive chronic kidney disease with stage 5 chronic kidney disease or end stage renal disease: Secondary | ICD-10-CM | POA: Diagnosis not present

## 2019-06-26 DIAGNOSIS — E785 Hyperlipidemia, unspecified: Secondary | ICD-10-CM | POA: Insufficient documentation

## 2019-06-26 DIAGNOSIS — Z87891 Personal history of nicotine dependence: Secondary | ICD-10-CM | POA: Diagnosis not present

## 2019-06-26 DIAGNOSIS — R569 Unspecified convulsions: Secondary | ICD-10-CM | POA: Insufficient documentation

## 2019-06-26 DIAGNOSIS — Z833 Family history of diabetes mellitus: Secondary | ICD-10-CM | POA: Diagnosis not present

## 2019-06-26 DIAGNOSIS — Z992 Dependence on renal dialysis: Secondary | ICD-10-CM

## 2019-06-26 HISTORY — PX: REMOVAL OF A DIALYSIS CATHETER: SHX6053

## 2019-06-26 LAB — PROTIME-INR
INR: 1.1 (ref 0.8–1.2)
Prothrombin Time: 13.8 seconds (ref 11.4–15.2)

## 2019-06-26 LAB — CBC WITH DIFFERENTIAL/PLATELET
Abs Immature Granulocytes: 0.06 10*3/uL (ref 0.00–0.07)
Basophils Absolute: 0.1 10*3/uL (ref 0.0–0.1)
Basophils Relative: 2 %
Eosinophils Absolute: 0.1 10*3/uL (ref 0.0–0.5)
Eosinophils Relative: 2 %
HCT: 25.2 % — ABNORMAL LOW (ref 39.0–52.0)
Hemoglobin: 8.1 g/dL — ABNORMAL LOW (ref 13.0–17.0)
Immature Granulocytes: 1 %
Lymphocytes Relative: 15 %
Lymphs Abs: 1.1 10*3/uL (ref 0.7–4.0)
MCH: 31.2 pg (ref 26.0–34.0)
MCHC: 32.1 g/dL (ref 30.0–36.0)
MCV: 96.9 fL (ref 80.0–100.0)
Monocytes Absolute: 0.6 10*3/uL (ref 0.1–1.0)
Monocytes Relative: 7 %
Neutro Abs: 5.8 10*3/uL (ref 1.7–7.7)
Neutrophils Relative %: 73 %
Platelets: 195 10*3/uL (ref 150–400)
RBC: 2.6 MIL/uL — ABNORMAL LOW (ref 4.22–5.81)
RDW: 14.1 % (ref 11.5–15.5)
WBC: 7.8 10*3/uL (ref 4.0–10.5)
nRBC: 0 % (ref 0.0–0.2)

## 2019-06-26 LAB — BASIC METABOLIC PANEL
Anion gap: 13 (ref 5–15)
BUN: 29 mg/dL — ABNORMAL HIGH (ref 8–23)
CO2: 25 mmol/L (ref 22–32)
Calcium: 7.5 mg/dL — ABNORMAL LOW (ref 8.9–10.3)
Chloride: 98 mmol/L (ref 98–111)
Creatinine, Ser: 5.58 mg/dL — ABNORMAL HIGH (ref 0.61–1.24)
GFR calc Af Amer: 11 mL/min — ABNORMAL LOW (ref >60)
GFR calc non Af Amer: 9 mL/min — ABNORMAL LOW (ref >60)
Glucose, Bld: 273 mg/dL — ABNORMAL HIGH (ref 70–99)
Potassium: 4.4 mmol/L (ref 3.5–5.1)
Sodium: 136 mmol/L (ref 135–145)

## 2019-06-26 LAB — TYPE AND SCREEN
ABO/RH(D): O POS
Antibody Screen: NEGATIVE

## 2019-06-26 LAB — GLUCOSE, CAPILLARY
Glucose-Capillary: 250 mg/dL — ABNORMAL HIGH (ref 70–99)
Glucose-Capillary: 220 mg/dL — ABNORMAL HIGH (ref 70–99)

## 2019-06-26 LAB — APTT: aPTT: 35 seconds (ref 24–36)

## 2019-06-26 SURGERY — REMOVAL, DIALYSIS CATHETER
Anesthesia: General | Site: Chest | Laterality: Right

## 2019-06-26 MED ORDER — GLYCOPYRROLATE 0.2 MG/ML IJ SOLN
INTRAMUSCULAR | Status: DC | PRN
  Administered 2019-06-26 (×2): .2 mg via INTRAVENOUS

## 2019-06-26 MED ORDER — BUPIVACAINE HCL (PF) 0.5 % IJ SOLN
INTRAMUSCULAR | Status: AC
  Filled 2019-06-26: qty 30

## 2019-06-26 MED ORDER — CEFAZOLIN SODIUM-DEXTROSE 1-4 GM/50ML-% IV SOLN
INTRAVENOUS | Status: AC
  Filled 2019-06-26: qty 50

## 2019-06-26 MED ORDER — CHLORHEXIDINE GLUCONATE CLOTH 2 % EX PADS
6.0000 | MEDICATED_PAD | Freq: Once | CUTANEOUS | Status: AC
  Administered 2019-06-26: 6 via TOPICAL

## 2019-06-26 MED ORDER — BACITRACIN ZINC 500 UNIT/GM EX OINT
TOPICAL_OINTMENT | CUTANEOUS | Status: AC
  Filled 2019-06-26: qty 28.35

## 2019-06-26 MED ORDER — MIDAZOLAM HCL 2 MG/2ML IJ SOLN
INTRAMUSCULAR | Status: AC
  Filled 2019-06-26: qty 2

## 2019-06-26 MED ORDER — ONDANSETRON HCL 4 MG/2ML IJ SOLN: 4.0000 mg | Freq: Once | INTRAMUSCULAR | Status: DC | PRN

## 2019-06-26 MED ORDER — FENTANYL CITRATE (PF) 100 MCG/2ML IJ SOLN
INTRAMUSCULAR | Status: AC
  Filled 2019-06-26: qty 2

## 2019-06-26 MED ORDER — GLYCOPYRROLATE 0.2 MG/ML IJ SOLN
INTRAMUSCULAR | Status: AC
  Filled 2019-06-26: qty 1

## 2019-06-26 MED ORDER — FENTANYL CITRATE (PF) 100 MCG/2ML IJ SOLN
INTRAMUSCULAR | Status: DC | PRN
  Administered 2019-06-26: 15 ug via INTRAVENOUS
  Administered 2019-06-26: 10 ug via INTRAVENOUS
  Administered 2019-06-26: 25 ug via INTRAVENOUS

## 2019-06-26 MED ORDER — ONDANSETRON HCL 4 MG/2ML IJ SOLN
INTRAMUSCULAR | Status: DC | PRN
  Administered 2019-06-26: 4 mg via INTRAVENOUS

## 2019-06-26 MED ORDER — PHENYLEPHRINE HCL (PRESSORS) 10 MG/ML IV SOLN
INTRAVENOUS | Status: DC | PRN
  Administered 2019-06-26 (×3): 100 ug via INTRAVENOUS

## 2019-06-26 MED ORDER — ALBUTEROL SULFATE HFA 108 (90 BASE) MCG/ACT IN AERS
INHALATION_SPRAY | RESPIRATORY_TRACT | Status: DC | PRN
  Administered 2019-06-26 (×3): 2 via RESPIRATORY_TRACT

## 2019-06-26 MED ORDER — HYDROCODONE-ACETAMINOPHEN 5-325 MG PO TABS: 1.0000 | ORAL_TABLET | Freq: Four times a day (QID) | ORAL | 0 refills | Status: DC | PRN

## 2019-06-26 MED ORDER — PROPOFOL 10 MG/ML IV BOLUS
INTRAVENOUS | Status: DC | PRN
  Administered 2019-06-26 (×2): 20 mg via INTRAVENOUS
  Administered 2019-06-26: 30 mg via INTRAVENOUS
  Administered 2019-06-26: 120 mg via INTRAVENOUS

## 2019-06-26 MED ORDER — DEXAMETHASONE SODIUM PHOSPHATE 10 MG/ML IJ SOLN
INTRAMUSCULAR | Status: DC | PRN
  Administered 2019-06-26: 10 mg via INTRAVENOUS

## 2019-06-26 MED ORDER — LIDOCAINE HCL (CARDIAC) PF 100 MG/5ML IV SOSY
PREFILLED_SYRINGE | INTRAVENOUS | Status: DC | PRN
  Administered 2019-06-26: 80 mg via INTRAVENOUS

## 2019-06-26 MED ORDER — ALBUTEROL SULFATE HFA 108 (90 BASE) MCG/ACT IN AERS
INHALATION_SPRAY | RESPIRATORY_TRACT | Status: AC
  Filled 2019-06-26: qty 6.7

## 2019-06-26 MED ORDER — BUPIVACAINE HCL 0.5 % IJ SOLN
INTRAMUSCULAR | Status: DC | PRN
  Administered 2019-06-26: 30 mL

## 2019-06-26 MED ORDER — EPHEDRINE SULFATE 50 MG/ML IJ SOLN
INTRAMUSCULAR | Status: DC | PRN
  Administered 2019-06-26 (×2): 5 mg via INTRAVENOUS

## 2019-06-26 MED ORDER — CEFAZOLIN SODIUM-DEXTROSE 1-4 GM/50ML-% IV SOLN
1.0000 g | INTRAVENOUS | Status: AC
  Administered 2019-06-26: 2 g via INTRAVENOUS

## 2019-06-26 MED ORDER — SODIUM CHLORIDE 0.9 % IV SOLN
INTRAVENOUS | Status: DC
  Administered 2019-06-26: 10 mL/h via INTRAVENOUS

## 2019-06-26 MED ORDER — BACITRACIN ZINC 500 UNIT/GM EX OINT
TOPICAL_OINTMENT | CUTANEOUS | Status: DC | PRN
  Administered 2019-06-26: 1 via TOPICAL

## 2019-06-26 MED ORDER — FENTANYL CITRATE (PF) 100 MCG/2ML IJ SOLN: 25.0000 ug | INTRAMUSCULAR | Status: DC | PRN

## 2019-06-26 SURGICAL SUPPLY — 31 items
BLADE SURG 15 STRL LF DISP TIS (BLADE) ×1 IMPLANT
BLADE SURG 15 STRL SS (BLADE) ×1
CANISTER SUCT 1200ML W/VALVE (MISCELLANEOUS) ×2 IMPLANT
CHLORAPREP W/TINT 26 (MISCELLANEOUS) ×2 IMPLANT
COVER WAND RF STERILE (DRAPES) ×2 IMPLANT
DERMABOND ADVANCED (GAUZE/BANDAGES/DRESSINGS) ×1
DERMABOND ADVANCED .7 DNX12 (GAUZE/BANDAGES/DRESSINGS) ×1 IMPLANT
DRAPE LAPAROTOMY 100X77 ABD (DRAPES) ×2 IMPLANT
DRSG TEGADERM 2X2.25 PEDS (GAUZE/BANDAGES/DRESSINGS) ×1 IMPLANT
DRSG TEGADERM 4X4.75 (GAUZE/BANDAGES/DRESSINGS) ×1 IMPLANT
ELECT REM PT RETURN 9FT ADLT (ELECTROSURGICAL) ×2
ELECTRODE REM PT RTRN 9FT ADLT (ELECTROSURGICAL) ×1 IMPLANT
GLOVE SURG SYN 8.0 (GLOVE) ×2 IMPLANT
GLOVE SURG SYN 8.0 PF PI (GLOVE) ×1 IMPLANT
GOWN STRL REUS W/ TWL LRG LVL3 (GOWN DISPOSABLE) ×1 IMPLANT
GOWN STRL REUS W/ TWL XL LVL3 (GOWN DISPOSABLE) ×1 IMPLANT
GOWN STRL REUS W/TWL LRG LVL3 (GOWN DISPOSABLE) ×1
GOWN STRL REUS W/TWL XL LVL3 (GOWN DISPOSABLE) ×1
KIT TURNOVER KIT A (KITS) ×2 IMPLANT
LABEL OR SOLS (LABEL) ×2 IMPLANT
NDL HYPO 25X1 1.5 SAFETY (NEEDLE) ×1 IMPLANT
NEEDLE HYPO 25X1 1.5 SAFETY (NEEDLE) ×2 IMPLANT
NS IRRIG 500ML POUR BTL (IV SOLUTION) ×2 IMPLANT
PACK BASIN MINOR (MISCELLANEOUS) ×2 IMPLANT
SPONGE GAUZE 2X2 8PLY STRL LF (GAUZE/BANDAGES/DRESSINGS) ×2 IMPLANT
SUT MNCRL+ 5-0 UNDYED PC-3 (SUTURE) ×1 IMPLANT
SUT MONOCRYL 5-0 (SUTURE) ×1
SUT VIC AB 0 CT2 27 (SUTURE) ×2 IMPLANT
SUT VIC AB 3-0 SH 27 (SUTURE) ×1
SUT VIC AB 3-0 SH 27X BRD (SUTURE) ×1 IMPLANT
SYR 10ML LL (SYRINGE) ×2 IMPLANT

## 2019-06-26 NOTE — Op Note (Signed)
  OPERATIVE NOTE   PROCEDURE: 1. Removal of a Peritoneal Dialysis Catheter 2. Removal of right IJ tunneled catheter  PRE-OPERATIVE DIAGNOSIS: Complication of dialysis catheter with poor dialysis by peritoneal method and nonfunction of the right IJ tunneled catheter,  End Stage Renal Disease  POST-OPERATIVE DIAGNOSIS: Same  SURGEON: Hortencia Pilar  ANESTHESIA: General with Local anesthetic    ESTIMATED BLOOD LOSS: Minimal   FINDING(S): 1. Catheter intact   SPECIMEN(S):  Catheter  INDICATIONS:   Kyle [name] is a 69 y.o. male who presents with a functioning left arm fistula.  He is transitioned from peritoneal dialysis to hemodialysis as peritoneal was no longer yielding adequate result.  At that time a tunnel catheter was placed which is no longer using.  Risks and benefits for removal of both catheters has been reviewed all questions been answered patient agrees to proceed..  DESCRIPTION: After obtaining full informed written consent, the patient was positioned supine. The peritoneal dialysis catheter and surrounding area is prepped and draped in a sterile fashion. The cuff was localized.  After appropriate timeout is called, local anesthetic with epinephrine is infiltrated into the surrounding tissues around the cuff in the left lower quadrant. Previous incision is open with an 15 blade scalpel and the dissection was carried down to expose the cuff of the tunneled catheter.  The catheter is then freed from the surrounding attachments and adhesions. Once the catheter has been freed circumferentially a pursestring suture of 0 Vicryl is placed within the anterior rectus sheath. The catheter is then removed from the peritoneal cavity and the pursestring suture secured. The second cuff is then localized and dissected free. The catheter is transected just distal to the cuff and subsequently removed in 2 pieces. The lower quadrant incision is then irrigated and closed in layers with  Vicryl.. A 4-0 Monocryl was used close the skin. Dermabond is applied to the incision.  Next the tunnel catheter was addressed.  The cuff was palpated and noted to be within 2 cm or so of the exit site.  Marcaine was infiltrated in and around the catheter.  The exit site was extended slightly and using blunt dissection the cuff was localized and then freed from its surrounding adhesions.  The catheter was then removed and a single intact piece.  Pressure was held at the base of the neck for 5 minutes.  Antibiotic ointment and a sterile dressing is applied to the exit sites of both catheters. Patient tolerated procedure well and there were no complications.  COMPLICATIONS: None  CONDITION: Unchanged  Hortencia Pilar. [facility] Office: [phone]  06/26/2019,12:08 PM

## 2019-06-26 NOTE — H&P (Signed)
[facility] History & Physical Update  The patient was interviewed and re-examined.  The patient's previous History and Physical has been reviewed and is unchanged.  There is no change in the plan of care. We plan to proceed with the scheduled procedure.  Hortencia Pilar, MD  06/26/2019, 10:42 AM

## 2019-06-26 NOTE — Discharge Instructions (Signed)

## 2019-06-26 NOTE — Anesthesia Preprocedure Evaluation (Addendum)
Anesthesia Evaluation  Patient identified by MRN, date of birth, ID band Patient awake    Reviewed: Allergy & Precautions, NPO status , Patient's Chart, lab work & pertinent test results  History of Anesthesia Complications Negative for: history of anesthetic complications  Airway Mallampati: II  TM Distance: >3 FB Neck ROM: Full    Dental  (+) Poor Dentition   Pulmonary COPD,  COPD inhaler, Current Smoker and Patient abstained from smoking.,    breath sounds clear to auscultation- rhonchi (-) wheezing      Cardiovascular hypertension, Pt. on medications + Peripheral Vascular Disease  (-) CAD, (-) Past MI, (-) Cardiac Stents and (-) CABG  Rhythm:Regular Rate:Normal - Systolic murmurs and - Diastolic murmurs Echo 06/03/12: 1. Left ventricular ejection fraction, by visual estimation, is 65 to  70%. The left ventricle has normal function. Left ventricular septal wall  thickness was mildly increased. Mildly increased left ventricular  posterior wall thickness. There is mildly  increased left ventricular hypertrophy.  2. Left ventricular diastolic parameters are consistent with Grade I  diastolic dysfunction (impaired relaxation).  3. The left ventricle has no regional wall motion abnormalities.  4. Global right ventricle was not well visualized.The right ventricular  size is normal. No increase in right ventricular wall thickness.  5. Left atrial size was normal.  6. Right atrial size was normal.  7. The mitral valve was not well visualized. Trivial mitral valve  regurgitation.  8. The tricuspid valve is not well visualized.  9. The aortic valve was not well visualized. Aortic valve regurgitation  is not visualized.  10. The pulmonic valve was not well visualized. Pulmonic valve  regurgitation is not visualized.  11. The aortic root was not well visualized.  12. The interatrial septum was not well visualized.     Neuro/Psych Seizures -, Well Controlled,  PSYCHIATRIC DISORDERS Anxiety Depression CVA (R sided weakness), Residual Symptoms    GI/Hepatic negative GI ROS, Neg liver ROS,   Endo/Other  diabetes, Insulin DependentHypothyroidism   Renal/GU ESRF and DialysisRenal disease     Musculoskeletal  (+) Arthritis ,   Abdominal (+) - obese,   Peds  Hematology negative hematology ROS (+) anemia ,   Anesthesia Other Findings   Reproductive/Obstetrics                            Anesthesia Physical Anesthesia Plan  ASA: IV  Anesthesia Plan: General   Post-op Pain Management:    Induction: Intravenous  PONV Risk Score and Plan: 0 and Ondansetron  Airway Management Planned: LMA  Additional Equipment:   Intra-op Plan:   Post-operative Plan:   Informed Consent: I have reviewed the patients History and Physical, chart, labs and discussed the procedure including the risks, benefits and alternatives for the proposed anesthesia with the patient or authorized representative who has indicated his/her understanding and acceptance.     Dental advisory given  Plan Discussed with: CRNA and Anesthesiologist  Anesthesia Plan Comments:         Anesthesia Quick Evaluation

## 2019-06-26 NOTE — Anesthesia Procedure Notes (Signed)
Procedure Name: LMA Insertion Performed by: Kelton Pillar, CRNA Pre-anesthesia Checklist: Patient identified, Emergency Drugs available, Suction available and Patient being monitored Patient Re-evaluated:Patient Re-evaluated prior to induction Oxygen Delivery Method: Circle system utilized Preoxygenation: Pre-oxygenation with 100% oxygen Induction Type: IV induction Ventilation: Mask ventilation without difficulty LMA: LMA inserted LMA Size: 4.0 Number of attempts: 1 Placement Confirmation: positive ETCO2,  CO2 detector and breath sounds checked- equal and bilateral Tube secured with: Tape Dental Injury: Teeth and Oropharynx as per pre-operative assessment

## 2019-06-26 NOTE — Transfer of Care (Signed)
Immediate Anesthesia Transfer of Care Note  Patient: Kyle [name]  Procedure(s) Performed: REMOVAL OF A DIALYSIS CATHETER ( PD CATH REMOVAL AND I J CATH REMOVAL (Right Chest)  Patient Location: PACU  Anesthesia Type:General  Level of Consciousness: awake, drowsy and patient cooperative  Airway & Oxygen Therapy: Patient Spontanous Breathing and Patient connected to face mask oxygen  Post-op Assessment: Report given to RN and Post -op Vital signs reviewed and stable  Post vital signs: Reviewed and stable  Last Vitals:  Vitals Value Taken Time  BP 133/63 06/26/19 1205  Temp 36.2 C 06/26/19 1205  Pulse 79 06/26/19 1207  Resp 18 06/26/19 1207  SpO2 100 % 06/26/19 1207  Vitals shown include unvalidated device data.  Last Pain:  Vitals:   06/26/19 0906  TempSrc: Tympanic  PainSc: 0-No pain         Complications: No apparent anesthesia complications

## 2019-06-26 NOTE — Anesthesia Postprocedure Evaluation (Signed)
Anesthesia Post Note  Patient: Kyle [name]  Procedure(s) Performed: REMOVAL OF A DIALYSIS CATHETER ( PD CATH REMOVAL AND I J CATH REMOVAL (Right Chest)  Patient location during evaluation: PACU Anesthesia Type: General Level of consciousness: awake and alert Pain management: pain level controlled Vital Signs Assessment: post-procedure vital signs reviewed and stable Respiratory status: spontaneous breathing and respiratory function stable Cardiovascular status: stable Anesthetic complications: no     Last Vitals:  Vitals:   06/26/19 1215 06/26/19 1220  BP:  139/60  Pulse: 78 78  Resp: 18 17  Temp:    SpO2: 100% 96%    Last Pain:  Vitals:   06/26/19 1220  TempSrc:   PainSc: 0-No pain                  [place] K

## 2019-07-22 ENCOUNTER — Encounter: Payer: Self-pay | Admitting: Emergency Medicine

## 2019-07-22 ENCOUNTER — Other Ambulatory Visit: Payer: Self-pay

## 2019-07-22 ENCOUNTER — Inpatient Hospital Stay
Admission: EM | Admit: 2019-07-22 | Discharge: 2019-07-29 | DRG: 377 | Disposition: A | Discharge status: Skilled Nursing Facility | Payer: Medicare Other | Attending: Internal Medicine | Admitting: Internal Medicine

## 2019-07-22 DIAGNOSIS — H4902 Third [oculomotor] nerve palsy, left eye: Secondary | ICD-10-CM | POA: Diagnosis not present

## 2019-07-22 DIAGNOSIS — I739 Peripheral vascular disease, unspecified: Secondary | ICD-10-CM | POA: Diagnosis present

## 2019-07-22 DIAGNOSIS — Z9181 History of falling: Secondary | ICD-10-CM

## 2019-07-22 DIAGNOSIS — K222 Esophageal obstruction: Secondary | ICD-10-CM | POA: Diagnosis not present

## 2019-07-22 DIAGNOSIS — J449 Chronic obstructive pulmonary disease, unspecified: Secondary | ICD-10-CM | POA: Diagnosis not present

## 2019-07-22 DIAGNOSIS — D631 Anemia in chronic kidney disease: Secondary | ICD-10-CM | POA: Diagnosis not present

## 2019-07-22 DIAGNOSIS — F329 Major depressive disorder, single episode, unspecified: Secondary | ICD-10-CM | POA: Diagnosis present

## 2019-07-22 DIAGNOSIS — N186 End stage renal disease: Secondary | ICD-10-CM | POA: Diagnosis present

## 2019-07-22 DIAGNOSIS — F419 Anxiety disorder, unspecified: Secondary | ICD-10-CM | POA: Diagnosis not present

## 2019-07-22 DIAGNOSIS — Z7289 Other problems related to lifestyle: Secondary | ICD-10-CM

## 2019-07-22 DIAGNOSIS — E890 Postprocedural hypothyroidism: Secondary | ICD-10-CM | POA: Diagnosis present

## 2019-07-22 DIAGNOSIS — K5520 Angiodysplasia of colon without hemorrhage: Secondary | ICD-10-CM | POA: Diagnosis not present

## 2019-07-22 DIAGNOSIS — E1142 Type 2 diabetes mellitus with diabetic polyneuropathy: Secondary | ICD-10-CM | POA: Diagnosis not present

## 2019-07-22 DIAGNOSIS — N2581 Secondary hyperparathyroidism of renal origin: Secondary | ICD-10-CM | POA: Diagnosis present

## 2019-07-22 DIAGNOSIS — E1151 Type 2 diabetes mellitus with diabetic peripheral angiopathy without gangrene: Secondary | ICD-10-CM | POA: Diagnosis not present

## 2019-07-22 DIAGNOSIS — D649 Anemia, unspecified: Secondary | ICD-10-CM | POA: Diagnosis present

## 2019-07-22 DIAGNOSIS — F17210 Nicotine dependence, cigarettes, uncomplicated: Secondary | ICD-10-CM | POA: Diagnosis not present

## 2019-07-22 DIAGNOSIS — J4489 Other specified chronic obstructive pulmonary disease: Secondary | ICD-10-CM | POA: Diagnosis present

## 2019-07-22 DIAGNOSIS — K921 Melena: Secondary | ICD-10-CM | POA: Diagnosis not present

## 2019-07-22 DIAGNOSIS — I69351 Hemiplegia and hemiparesis following cerebral infarction affecting right dominant side: Secondary | ICD-10-CM | POA: Diagnosis not present

## 2019-07-22 DIAGNOSIS — Z20822 Contact with and (suspected) exposure to covid-19: Secondary | ICD-10-CM | POA: Diagnosis not present

## 2019-07-22 DIAGNOSIS — E1122 Type 2 diabetes mellitus with diabetic chronic kidney disease: Secondary | ICD-10-CM | POA: Diagnosis not present

## 2019-07-22 DIAGNOSIS — K5730 Diverticulosis of large intestine without perforation or abscess without bleeding: Secondary | ICD-10-CM | POA: Diagnosis present

## 2019-07-22 DIAGNOSIS — K219 Gastro-esophageal reflux disease without esophagitis: Secondary | ICD-10-CM | POA: Diagnosis present

## 2019-07-22 DIAGNOSIS — N400 Enlarged prostate without lower urinary tract symptoms: Secondary | ICD-10-CM | POA: Diagnosis present

## 2019-07-22 DIAGNOSIS — K922 Gastrointestinal hemorrhage, unspecified: Secondary | ICD-10-CM | POA: Diagnosis present

## 2019-07-22 DIAGNOSIS — K31811 Angiodysplasia of stomach and duodenum with bleeding: Principal | ICD-10-CM | POA: Diagnosis present

## 2019-07-22 DIAGNOSIS — K317 Polyp of stomach and duodenum: Secondary | ICD-10-CM | POA: Diagnosis present

## 2019-07-22 DIAGNOSIS — E785 Hyperlipidemia, unspecified: Secondary | ICD-10-CM | POA: Diagnosis not present

## 2019-07-22 DIAGNOSIS — Z833 Family history of diabetes mellitus: Secondary | ICD-10-CM

## 2019-07-22 DIAGNOSIS — I120 Hypertensive chronic kidney disease with stage 5 chronic kidney disease or end stage renal disease: Secondary | ICD-10-CM | POA: Diagnosis present

## 2019-07-22 DIAGNOSIS — G40909 Epilepsy, unspecified, not intractable, without status epilepticus: Secondary | ICD-10-CM | POA: Diagnosis not present

## 2019-07-22 DIAGNOSIS — Z8719 Personal history of other diseases of the digestive system: Secondary | ICD-10-CM

## 2019-07-22 DIAGNOSIS — Z79890 Hormone replacement therapy (postmenopausal): Secondary | ICD-10-CM

## 2019-07-22 DIAGNOSIS — Z992 Dependence on renal dialysis: Secondary | ICD-10-CM

## 2019-07-22 DIAGNOSIS — Z7902 Long term (current) use of antithrombotics/antiplatelets: Secondary | ICD-10-CM

## 2019-07-22 DIAGNOSIS — I69398 Other sequelae of cerebral infarction: Secondary | ICD-10-CM

## 2019-07-22 DIAGNOSIS — Z79899 Other long term (current) drug therapy: Secondary | ICD-10-CM

## 2019-07-22 DIAGNOSIS — D62 Acute posthemorrhagic anemia: Secondary | ICD-10-CM | POA: Diagnosis present

## 2019-07-22 DIAGNOSIS — Z794 Long term (current) use of insulin: Secondary | ICD-10-CM

## 2019-07-22 DIAGNOSIS — E119 Type 2 diabetes mellitus without complications: Secondary | ICD-10-CM

## 2019-07-22 DIAGNOSIS — Z8673 Personal history of transient ischemic attack (TIA), and cerebral infarction without residual deficits: Secondary | ICD-10-CM

## 2019-07-22 DIAGNOSIS — Z87442 Personal history of urinary calculi: Secondary | ICD-10-CM

## 2019-07-22 LAB — CBC
HCT: 21 % — ABNORMAL LOW (ref 39.0–52.0)
Hemoglobin: 6.7 g/dL — ABNORMAL LOW (ref 13.0–17.0)
MCH: 31.3 pg (ref 26.0–34.0)
MCHC: 31.9 g/dL (ref 30.0–36.0)
MCV: 98.1 fL (ref 80.0–100.0)
Platelets: 229 10*3/uL (ref 150–400)
RBC: 2.14 MIL/uL — ABNORMAL LOW (ref 4.22–5.81)
RDW: 14.5 % (ref 11.5–15.5)
WBC: 5.1 10*3/uL (ref 4.0–10.5)
nRBC: 0 % (ref 0.0–0.2)

## 2019-07-22 LAB — COMPREHENSIVE METABOLIC PANEL
ALT: 12 U/L (ref 0–44)
AST: 19 U/L (ref 15–41)
Albumin: 3.3 g/dL — ABNORMAL LOW (ref 3.5–5.0)
Alkaline Phosphatase: 115 U/L (ref 38–126)
Anion gap: 12 (ref 5–15)
BUN: 11 mg/dL (ref 8–23)
CO2: 28 mmol/L (ref 22–32)
Calcium: 7.8 mg/dL — ABNORMAL LOW (ref 8.9–10.3)
Chloride: 96 mmol/L — ABNORMAL LOW (ref 98–111)
Creatinine, Ser: 3.38 mg/dL — ABNORMAL HIGH (ref 0.61–1.24)
GFR calc Af Amer: 20 mL/min — ABNORMAL LOW (ref >60)
GFR calc non Af Amer: 17 mL/min — ABNORMAL LOW (ref >60)
Glucose, Bld: 152 mg/dL — ABNORMAL HIGH (ref 70–99)
Potassium: 3.3 mmol/L — ABNORMAL LOW (ref 3.5–5.1)
Sodium: 136 mmol/L (ref 135–145)
Total Bilirubin: 0.6 mg/dL (ref 0.3–1.2)
Total Protein: 6.5 g/dL (ref 6.5–8.1)

## 2019-07-22 LAB — GLUCOSE, CAPILLARY: Glucose-Capillary: 185 mg/dL — ABNORMAL HIGH (ref 70–99)

## 2019-07-22 LAB — SARS CORONAVIRUS 2 BY RT PCR (HOSPITAL ORDER, PERFORMED IN ~~LOC~~ HOSPITAL LAB): SARS Coronavirus 2: NEGATIVE

## 2019-07-22 LAB — PREPARE RBC (CROSSMATCH)

## 2019-07-22 MED ORDER — LEVOTHYROXINE SODIUM 50 MCG PO TABS
175.0000 ug | ORAL_TABLET | Freq: Every day | ORAL | Status: DC
  Administered 2019-07-23 – 2019-07-29 (×7): 175 ug via ORAL
  Filled 2019-07-22: qty 1
  Filled 2019-07-22: qty 4
  Filled 2019-07-22 (×5): qty 1

## 2019-07-22 MED ORDER — LEVETIRACETAM 500 MG PO TABS
500.0000 mg | ORAL_TABLET | Freq: Two times a day (BID) | ORAL | Status: DC
  Administered 2019-07-23 – 2019-07-29 (×12): 500 mg via ORAL
  Filled 2019-07-22 (×14): qty 1

## 2019-07-22 MED ORDER — SODIUM CHLORIDE 0.9 % IV SOLN
10.0000 mL/h | Freq: Once | INTRAVENOUS | Status: AC
  Administered 2019-07-22: 10 mL/h via INTRAVENOUS

## 2019-07-22 MED ORDER — CALCIUM ACETATE (PHOS BINDER) 667 MG PO CAPS
1334.0000 mg | ORAL_CAPSULE | Freq: Three times a day (TID) | ORAL | Status: DC
  Administered 2019-07-23 – 2019-07-29 (×12): 1334 mg via ORAL
  Filled 2019-07-22 (×22): qty 2

## 2019-07-22 MED ORDER — PANTOPRAZOLE SODIUM 40 MG IV SOLR
40.0000 mg | Freq: Once | INTRAVENOUS | Status: AC
  Administered 2019-07-22: 40 mg via INTRAVENOUS
  Filled 2019-07-22: qty 40

## 2019-07-22 MED ORDER — PANTOPRAZOLE SODIUM 40 MG IV SOLR
40.0000 mg | Freq: Every day | INTRAVENOUS | Status: DC
  Administered 2019-07-23 – 2019-07-27 (×6): 40 mg via INTRAVENOUS
  Filled 2019-07-22 (×6): qty 40

## 2019-07-22 MED ORDER — ACETAMINOPHEN 325 MG PO TABS: 650.0000 mg | ORAL_TABLET | Freq: Four times a day (QID) | ORAL | Status: DC | PRN

## 2019-07-22 MED ORDER — METOPROLOL SUCCINATE ER 50 MG PO TB24
50.0000 mg | ORAL_TABLET | Freq: Every day | ORAL | Status: DC
  Administered 2019-07-23 – 2019-07-27 (×5): 50 mg via ORAL
  Filled 2019-07-22 (×6): qty 1

## 2019-07-22 MED ORDER — ACETAMINOPHEN 650 MG RE SUPP: 650.0000 mg | Freq: Four times a day (QID) | RECTAL | Status: DC | PRN

## 2019-07-22 MED ORDER — SODIUM CHLORIDE 0.9% IV SOLUTION
Freq: Once | INTRAVENOUS | Status: DC
  Filled 2019-07-22: qty 250

## 2019-07-22 MED ORDER — SODIUM CHLORIDE 0.9% IV SOLUTION
Freq: Once | INTRAVENOUS | Status: AC
  Filled 2019-07-22: qty 250

## 2019-07-22 MED ORDER — INSULIN ASPART 100 UNIT/ML ~~LOC~~ SOLN
0.0000 [IU] | SUBCUTANEOUS | Status: DC
  Administered 2019-07-23: 3 [IU] via SUBCUTANEOUS
  Administered 2019-07-23 (×3): 1 [IU] via SUBCUTANEOUS
  Administered 2019-07-23: 2 [IU] via SUBCUTANEOUS
  Administered 2019-07-24: 1 [IU] via SUBCUTANEOUS
  Administered 2019-07-24: 3 [IU] via SUBCUTANEOUS
  Administered 2019-07-24: 5 [IU] via SUBCUTANEOUS
  Administered 2019-07-24: 1 [IU] via SUBCUTANEOUS
  Administered 2019-07-25: 2 [IU] via SUBCUTANEOUS
  Administered 2019-07-25: 1 [IU] via SUBCUTANEOUS
  Administered 2019-07-25 – 2019-07-26 (×3): 2 [IU] via SUBCUTANEOUS
  Administered 2019-07-26 (×2): 1 [IU] via SUBCUTANEOUS
  Administered 2019-07-26: 3 [IU] via SUBCUTANEOUS
  Administered 2019-07-26: 2 [IU] via SUBCUTANEOUS
  Administered 2019-07-27 (×2): 3 [IU] via SUBCUTANEOUS
  Administered 2019-07-27 – 2019-07-28 (×2): 2 [IU] via SUBCUTANEOUS
  Administered 2019-07-28: 3 [IU] via SUBCUTANEOUS
  Administered 2019-07-28 – 2019-07-29 (×4): 1 [IU] via SUBCUTANEOUS
  Filled 2019-07-22 (×27): qty 1

## 2019-07-22 MED ORDER — ATORVASTATIN CALCIUM 80 MG PO TABS
80.0000 mg | ORAL_TABLET | Freq: Every day | ORAL | Status: DC
  Administered 2019-07-23 – 2019-07-29 (×6): 80 mg via ORAL
  Filled 2019-07-22 (×6): qty 1

## 2019-07-22 NOTE — ED Notes (Signed)
prbc's infusing.  Pt alert  No acute distress noted.

## 2019-07-22 NOTE — ED Notes (Signed)
Pt signed consent for blood , this nurse witnesses consent.

## 2019-07-22 NOTE — ED Triage Notes (Signed)
From dialysis for low hgb 5.7.  Pt finished dialysis today. No pain. Reports they just found his hgb low on blood work.

## 2019-07-22 NOTE — ED Notes (Signed)
Pt sitting in lobby with no distress noted; pt updated on wait time and FSBS performed at request

## 2019-07-22 NOTE — H&P (Signed)
History and Physical    Demeco [name] YYT:[id number] DOB: [birth year] DOA: 07/22/2019  PCP: Idelle Crouch, MD   Patient coming from: Home  I have personally briefly reviewed patient's old medical records in [place]  Chief Complaint: Melena, sent from dialysis for low hemoglobin  HPI: Kyle [name] is a 69 y.o. 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  He endorses lightheadedness but denies abdominal pain, shortness of breath, chest pain.  ED Course: On arrival he was hemodynamically stable with blood pressure 163/72 and pulse 92.  Overall normal vitals.  Hemoglobin 6.7.  Stool for occult blood positive.  Other blood work as expected for dialysis patient.  Transfusion of 1 unit PRBC started in the emergency room.  The ER provider spoke with GI, Dr. Alice Reichert who advised on hospitalist admit.  Review of Systems: As per HPI otherwise 10 point review of systems negative.    Past Medical History:  Diagnosis Date  . Anemia   . Anxiety   . BPH (benign prostatic hyperplasia)   . Chronic kidney disease   . Colon polyps   . COPD (chronic obstructive pulmonary disease) ([place])   . Depression   . Diabetes mellitus without complication ([place])   . Diverticulosis of colon   . History of kidney stones   . Hyperlipidemia   . Hypertension   . Hypothyroidism   . Nephrolithiasis   .  Seizures ([place])    2015 last seizure  . Stroke [facility])     Past Surgical History:  Procedure Laterality Date  . AV FISTULA PLACEMENT Left 03/30/2019   Procedure: right brachiocephalic fistula creation;  Surgeon: Marty Heck, MD;  Location: [place];  Service: Vascular;  Laterality: Left;  . BIOPSY  04/05/2019   Procedure: BIOPSY;  Surgeon: Wilford Corner, MD;  Location: [place];  Service: Endoscopy;;  . COLONOSCOPY    . COLONOSCOPY WITH PROPOFOL N/A 05/30/2015   Procedure: COLONOSCOPY WITH PROPOFOL;  Surgeon: Manya Silvas, MD;  Location[facility] ENDOSCOPY;  Service: Endoscopy;  Laterality: N/A;  . COLONOSCOPY WITH PROPOFOL N/A 12/01/2018   Procedure: COLONOSCOPY WITH PROPOFOL;  Surgeon: Toledo, [name], MD;  Location: [facility] ENDOSCOPY;  Service: Gastroenterology;  Laterality: N/A;  . COLONOSCOPY WITH PROPOFOL N/A 04/09/2019   Procedure: COLONOSCOPY WITH PROPOFOL;  Surgeon: Carol Ada, MD;  Location: [place];  Service: Endoscopy;  Laterality: N/A;  . ENTEROSCOPY N/A 05/05/2019   Procedure: ENTEROSCOPY;  Surgeon: Ronnette Juniper, MD;  Location: [facility] ENDOSCOPY;  Service: Gastroenterology;  Laterality: N/A;  Push enteroscopy  . ESOPHAGOGASTRODUODENOSCOPY (EGD) WITH PROPOFOL N/A 04/05/2019   Procedure: ESOPHAGOGASTRODUODENOSCOPY (EGD) WITH PROPOFOL;  Surgeon: Wilford Corner, MD;  Location: [place];  Service: Endoscopy;  Laterality: N/A;  . GIVENS CAPSULE STUDY N/A 05/01/2019   Procedure: GIVENS CAPSULE STUDY;  Surgeon: Ronald Lobo, MD;  Location: [place];  Service: Endoscopy;  Laterality: N/A;  . HOT HEMOSTASIS N/A 04/09/2019   Procedure: HOT HEMOSTASIS (ARGON PLASMA COAGULATION/BICAP);  Surgeon: Carol Ada, MD;  Location: [place];  Service: Endoscopy;  Laterality: N/A;  . HOT HEMOSTASIS N/A 05/05/2019   Procedure: HOT HEMOSTASIS (ARGON PLASMA COAGULATION/BICAP);  Surgeon: Ronnette Juniper, MD;  Location: [place];  Service: Gastroenterology;  Laterality: N/A;  . INSERTION  OF DIALYSIS CATHETER Right 03/30/2019   Procedure: Ultrasound guided right internal jugular tunneled dialysis catheter placement;  Surgeon: Marty Heck, MD;  Location[facility] OR;  Service: Vascular;  Laterality: Right;  . kidney stone    . REMOVAL OF A DIALYSIS CATHETER Right 06/26/2019   Procedure: REMOVAL OF A DIALYSIS CATHETER ( PD CATH REMOVAL AND I J CATH REMOVAL;  Surgeon: Katha Cabal, MD;  Location: [facility] ORS;  Service: Vascular;  Laterality: Right;  . THYROIDECTOMY    . VARICOCELE EXCISION    . VIDEO BRONCHOSCOPY Bilateral 05/25/2016   Procedure: VIDEO BRONCHOSCOPY WITHOUT FLUORO;  Surgeon: Juanito Doom, MD;  Location[facility] ENDOSCOPY;  Service: Cardiopulmonary;  Laterality: Bilateral;     reports that he has been smoking cigarettes. He has a 10.00 pack-year smoking history. He has never used smokeless tobacco. He reports current alcohol use of about 8.0 standard drinks of alcohol per week. He reports that he does not use drugs.  No Known Allergies  Family History  Problem Relation Age of Onset  . Diabetes Mother   . Diabetes Maternal Grandmother   . Diabetes Maternal Grandfather   . Lung cancer Father   . Emphysema Paternal Grandfather      Prior to Admission medications   Medication Sig Start Date End Date Taking? Authorizing Provider  atorvastatin (LIPITOR) 80 MG tablet Take 1 tablet (80 mg total) by mouth at bedtime. 05/07/19 06/25/19  Aline August, MD  B Complex-C-Folic Acid (NEPHRO-VITE PO) Take 1 tablet by mouth daily.    [provider]  calcium acetate (PHOSLO) 667 MG capsule Take 2 capsules (1,334 mg total) by mouth 3 (three) times daily with meals. 03/20/19   Loletha Grayer, MD  clopidogrel (PLAVIX) 75 MG tablet Take 75 mg by mouth daily.    [provider]  gentamicin cream (GARAMYCIN) 0.1 % Apply 1 application topically daily. Patient not taking: Reported on 06/23/2019 03/20/19   Loletha Grayer, MD  HYDROcodone-acetaminophen [facility])  5-325 MG tablet Take 1-2 tablets by mouth every 6 (six) hours as needed for moderate pain or severe pain. 06/26/19   Schnier, Dolores Lory, MD  hydrOXYzine (ATARAX/VISTARIL) 25 MG tablet Take 25 mg by mouth every 6 (six) hours as needed for itching.    [provider]  insulin glargine (LANTUS) 100 UNIT/ML injection Inject 10 Units into the skin at bedtime.     [provider]  insulin lispro (HUMALOG) 100 UNIT/ML KwikPen Inject 0-0.12 mLs (0-12 Units total) into the skin See admin instructions. Inject 0-12 units into the skin three times a day before meals, depending on BGL: <70 = CALL MD; 70-200 = give nothing; 201-250 = 2 units; 251-300 = 4 units; 301-350 = 6 units; 351-400 = 8 units; 401-450 = 10 units; 451-600 = 12 units 05/07/19   Aline August, MD  levETIRAcetam (KEPPRA) 500 MG tablet Take 500 mg by mouth 2 (two) times a day. 09/05/15   [provider]  levothyroxine (SYNTHROID) 175 MCG tablet Take 175 mcg by mouth daily before breakfast.  02/25/19   [provider]  lidocaine-prilocaine (EMLA) cream Apply 1 application topically Every Tuesday,Thursday,and Saturday with dialysis.     [provider]  metoprolol succinate (TOPROL-XL) 50 MG 24 hr tablet Take 1 tablet (50  mg total) by mouth daily. Take with or immediately following a meal. Patient taking differently: Take 50 mg by mouth in the morning and at bedtime. Take with or immediately following a meal. 04/17/19 06/25/19  Kayleen Memos, DO  Pancrelipase, Lip-Prot-Amyl, 24000-76000 units CPEP Take 1 capsule by mouth 3 (three) times daily.    [provider]  pantoprazole (PROTONIX) 40 MG tablet Take 1 tablet (40 mg total) by mouth 2 (two) times daily. 04/16/19 06/25/19  Kayleen Memos, DO  polyethylene glycol powder (GLYCOLAX/MIRALAX) 17 GM/SCOOP powder Take 17 g by mouth daily as needed for moderate constipation.     [provider]  potassium chloride SA (KLOR-CON) 20 MEQ tablet Take 20  mEq by mouth 2 (two) times daily.    [provider]    Physical Exam: Vitals:   07/22/19 2204 07/22/19 2215 07/22/19 2216 07/22/19 2222  BP: (!) 157/70 (!) 157/70  (!) 159/59  Pulse:  81 82 81  Resp:  20    Temp:  98.6 F (37 C)    TempSrc:  Oral    SpO2:   100% 100%  Weight:      Height:         Vitals:   07/22/19 2204 07/22/19 2215 07/22/19 2216 07/22/19 2222  BP: (!) 157/70 (!) 157/70  (!) 159/59  Pulse:  81 82 81  Resp:  20    Temp:  98.6 F (37 C)    TempSrc:  Oral    SpO2:   100% 100%  Weight:      Height:        Constitutional: Alert and awake, oriented x3, not in any acute distress. Eyes: PERLA, EOMI, irises appear normal, anicteric sclera,  ENMT: external ears and nose appear normal, normal hearing             Lips appears normal, oropharynx mucosa, tongue, posterior pharynx appear normal  Neck: neck appears normal, no masses, normal ROM, no thyromegaly, no JVD  CVS: S1-S2 clear, no murmur rubs or gallops,  , no carotid bruits, pedal pulses palpable, No LE edema Respiratory:  clear to auscultation bilaterally, no wheezing, rales or rhonchi. Respiratory effort normal. No accessory muscle use.  Abdomen: soft nontender, nondistended, normal bowel sounds, no hepatosplenomegaly, no hernias Musculoskeletal: : no cyanosis, clubbing , no contractures or atrophy Neuro: Cranial nerves II-XII intact, sensation, reflexes normal, strength Psych: judgement and insight appear normal, stable mood and affect,  Skin: no rashes or lesions or ulcers, no induration or nodules   Labs on Admission: I have personally reviewed following labs and imaging studies  CBC: Recent Labs  Lab 07/22/19 1554  WBC 5.1  HGB 6.7*  HCT 21.0*  MCV 98.1  PLT 503   Basic Metabolic Panel: Recent Labs  Lab 07/22/19 1554  NA 136  K 3.3*  CL 96*  CO2 28  GLUCOSE 152*  BUN 11  CREATININE 3.38*  CALCIUM 7.8*   GFR: Estimated Creatinine Clearance: 22 mL/min (A) (by C-G formula  based on SCr of 3.38 mg/dL (H)). Liver Function Tests: Recent Labs  Lab 07/22/19 1554  AST 19  ALT 12  ALKPHOS 115  BILITOT 0.6  PROT 6.5  ALBUMIN 3.3*   No results for input(s): LIPASE, AMYLASE in the last 168 hours. No results for input(s): AMMONIA in the last 168 hours. Coagulation Profile: No results for input(s): INR, PROTIME in the last 168 hours. Cardiac Enzymes: No results for input(s): CKTOTAL, CKMB, CKMBINDEX, TROPONINI in the  last 168 hours. BNP (last 3 results) No results for input(s): PROBNP in the last 8760 hours. HbA1C: No results for input(s): HGBA1C in the last 72 hours. CBG: Recent Labs  Lab 07/22/19 2103  GLUCAP 185*   Lipid Profile: No results for input(s): CHOL, HDL, LDLCALC, TRIG, CHOLHDL, LDLDIRECT in the last 72 hours. Thyroid Function Tests: No results for input(s): TSH, T4TOTAL, FREET4, T3FREE, THYROIDAB in the last 72 hours. Anemia Panel: No results for input(s): VITAMINB12, FOLATE, FERRITIN, TIBC, IRON, RETICCTPCT in the last 72 hours. Urine analysis:    Component Value Date/Time   COLORURINE YELLOW 03/22/2019 1622   APPEARANCEUR CLEAR 03/22/2019 1622   APPEARANCEUR Clear 08/08/2013 1947   LABSPEC 1.018 03/22/2019 1622   LABSPEC 1.012 08/08/2013 1947   PHURINE 5.0 03/22/2019 1622   GLUCOSEU >=500 (A) 03/22/2019 1622   GLUCOSEU 50 mg/dL 08/08/2013 1947   HGBUR NEGATIVE 03/22/2019 [place] 03/22/2019 1622   BILIRUBINUR Negative 08/08/2013 1947   KETONESUR NEGATIVE 03/22/2019 1622   PROTEINUR >=300 (A) 03/22/2019 1622   NITRITE NEGATIVE 03/22/2019 1622   LEUKOCYTESUR NEGATIVE 03/22/2019 1622   LEUKOCYTESUR Negative 08/08/2013 1947    Radiological Exams on Admission: No results found.  EKG: Independently reviewed.   Assessment/Plan  Melena Acute on chronic blood loss anemia   History of recurrent GI bleed -Patient presents with melanotic stools and hemoglobin 6.7.  Hemodynamically stable -Patient had 2 recent  admission for GI bleeding with EGD, colonoscopy, capsule endoscopy and push enteroscopy significant only for single nonbleeding colonic angiodysplastic lesion status post monopolar probe treatment.   -Type cross and transfuse 1 unit now, second unit hopefully in dialysis in the a.m. -IV Protonix -Keep n.p.o. 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 -With residual right-sided weakness and left sided ptosis with 3rd cranial nerve palsy -Hold Plavix, continue statin  End-stage renal disease on hemodialysis -Nephrology consult for continuation of dialysis  Essential hypotension -Resume Toprol, Norvasc and losartan but close monitoring for hypotension in view of GI bleeding -Patient had an episode of orthostatic hypotension during his last hospitalization requiring midodrine - Diabetes mellitus type 2 with hyperlipidemia -Sliding scale insulin coverage -Follow-up A1c  Hypothyroidism -Continue Synthroid  Seizure disorder -Continue Keppra  DVT prophylaxis: SCDs Code Status: full code  Family Communication:    Disposition Plan: Back to previous home environment Consults called: GI and renal Status:obs    Athena Masse MD [facility]     07/22/2019, 10:28 PM

## 2019-07-22 NOTE — ED Provider Notes (Signed)
[facility] EMERGENCY DEPARTMENT Provider Note   CSN: [id number] Arrival date & time: 07/22/19  1545     History Chief Complaint  Patient presents with  . Abnormal Lab    Kyle [name] is a 69 y.o. male hx of COPD, DM, stroke on plavix, here presenting with weakness and anemia and black stools.  Patient states that his stools has been very dark for the last several weeks.  He just feels generalized weakness and dizziness .  Patient went to dialysis today and his hemoglobin apparently is 5.7 so he was sent here for evaluation.  Of note, patient had a GI bleed back in March of this year.  He was admitted to the hospital and had extensive work-up and showed AVM that was not bleeding.  He was restarted on Plavix at discharge.  The history is provided by the patient.       Past Medical History:  Diagnosis Date  . Anemia   . Anxiety   . BPH (benign prostatic hyperplasia)   . Chronic kidney disease   . Colon polyps   . COPD (chronic obstructive pulmonary disease) ([place])   . Depression   . Diabetes mellitus without complication ([place])   . Diverticulosis of colon   . History of kidney stones   . Hyperlipidemia   . Hypertension   . Hypothyroidism   . Nephrolithiasis   . Seizures ([place])    2015 last seizure  . Stroke [facility])     Patient Active Problem List   Diagnosis Date Noted  . Current every day smoker 06/22/2019  . Falls frequently 06/22/2019  . Palliative care by specialist   . Goals of care, counseling/discussion   . Orthostasis 05/02/2019  . ESRD (end stage renal disease) on dialysis ([place])   . Acute blood loss anemia 04/30/2019  . Acute on chronic blood loss anemia 04/29/2019  . History of CVA (cerebrovascular accident) 04/29/2019  . GI bleed 04/04/2019  . 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[place]) 03/20/2019  . Generalized weakness   . Diabetic polyneuropathy associated with type 2 diabetes mellitus ([place])   . Cerebrovascular accident (CVA) ([place])   . PAD  (peripheral artery disease) ([place]) 03/17/2019  . Ptosis of eyelid, left 03/15/2019  . Physical deconditioning 03/14/2019  . Hypomagnesemia 03/13/2019  . Hyperphosphatemia 03/13/2019  . ESRD (end stage renal disease) ([place]) 03/13/2019  . Chronic kidney disease with peritoneal dialysis as preferred modality, stage 5 ([place]) 03/13/2019  . Prolonged QT interval 03/13/2019  . Hypertension associated with diabetes ([place]) 03/13/2019  . Hypothyroidism 03/13/2019  . Chronic diarrhea 03/13/2019  . Hypocalcemia 03/12/2019  . Ataxia 03/10/2019  . Dizziness 03/10/2019  . Vision problem 03/10/2019  . Hypokalemia 09/16/2018  . ESRD on peritoneal dialysis ([place]) 03/04/2018  . Lung nodule 06/12/2016  . Narrowing of airway   . Cigarette smoker 05/24/2016  . Collapse of right lung 05/24/2016  . COPD with chronic bronchitis ([place]) 05/24/2016  . Diarrhea due to malabsorption 07/01/2015  . Hx of adenomatous colonic polyps 07/01/2015  . Acne 05/20/2015  . Calculus of kidney 05/20/2015  . Chest pain, non-cardiac 05/20/2015  . Seizure disorder ([place]) 05/20/2015  . Current tobacco use 05/20/2015  . Chronic kidney disease (CKD), stage III (moderate) 03/29/2015  . Insulin dependent type 2 diabetes mellitus ([place]) 03/15/2015  . Lipoma of shoulder 03/08/2014  . Other synovitis and tenosynovitis, right shoulder 03/08/2014  . Right supraspinatus tenosynovitis 03/08/2014  . Bursitis of elbow 02/15/2014  . Olecranon bursitis of right  elbow 02/15/2014  . Absolute anemia 08/08/2013  . 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 Back pain, chronic 08/08/2013  . Chronic obstructive pulmonary disease ([place]) 08/08/2013  . BP (high blood pressure) 08/08/2013  . Hyperlipidemia associated with type 2 diabetes mellitus ([place]) 08/08/2013  . Acne erythematosa 08/08/2013    Past Surgical History:  Procedure Laterality Date  . AV FISTULA PLACEMENT Left 03/30/2019   Procedure: right brachiocephalic fistula creation;  Surgeon: Marty Heck, MD;  Location: [facility];  Service: Vascular;  Laterality: Left;  . BIOPSY  04/05/2019   Procedure: BIOPSY;  Surgeon: Wilford Corner, MD;  Location: [place];  Service: Endoscopy;;  . COLONOSCOPY    . COLONOSCOPY WITH PROPOFOL N/A 05/30/2015   Procedure: COLONOSCOPY WITH PROPOFOL;  Surgeon: Manya Silvas, MD;  Location: [facility] ENDOSCOPY;  Service: Endoscopy;  Laterality: N/A;  . COLONOSCOPY WITH PROPOFOL N/A 12/01/2018   Procedure: COLONOSCOPY WITH PROPOFOL;  Surgeon: Toledo, [name], MD;  Location: [facility] ENDOSCOPY;  Service: Gastroenterology;  Laterality: N/A;  . COLONOSCOPY WITH PROPOFOL N/A 04/09/2019   Procedure: COLONOSCOPY WITH PROPOFOL;  Surgeon: Carol Ada, MD;  Location: [place];  Service: Endoscopy;  Laterality: N/A;  . ENTEROSCOPY N/A 05/05/2019   Procedure: ENTEROSCOPY;  Surgeon: Ronnette Juniper, MD;  Location: [facility] ENDOSCOPY;  Service: Gastroenterology;  Laterality: N/A;  Push enteroscopy  . ESOPHAGOGASTRODUODENOSCOPY (EGD) WITH PROPOFOL N/A 04/05/2019   Procedure: ESOPHAGOGASTRODUODENOSCOPY (EGD) WITH PROPOFOL;  Surgeon: Wilford Corner, MD;  Location: [place];  Service: Endoscopy;  Laterality: N/A;  . GIVENS CAPSULE STUDY N/A 05/01/2019   Procedure: GIVENS CAPSULE STUDY;  Surgeon: Ronald Lobo, MD;  Location: [place];  Service: Endoscopy;  Laterality: N/A;  . HOT HEMOSTASIS N/A 04/09/2019   Procedure: HOT HEMOSTASIS (ARGON PLASMA COAGULATION/BICAP);  Surgeon: Carol Ada, MD;  Location: [place];  Service: Endoscopy;  Laterality: N/A;  . HOT HEMOSTASIS N/A 05/05/2019   Procedure: HOT HEMOSTASIS (ARGON PLASMA COAGULATION/BICAP);  Surgeon: Ronnette Juniper, MD;  Location: [place];  Service: Gastroenterology;  Laterality: N/A;  . INSERTION OF DIALYSIS CATHETER Right 03/30/2019   Procedure: Ultrasound guided right internal jugular tunneled dialysis catheter placement;  Surgeon: Marty Heck, MD;  Location: [facility] OR;  Service: Vascular;  Laterality:  Right;  . kidney stone    . REMOVAL OF A DIALYSIS CATHETER Right 06/26/2019   Procedure: REMOVAL OF A DIALYSIS CATHETER ( PD CATH REMOVAL AND I J CATH REMOVAL;  Surgeon: Katha Cabal, MD;  Location: [facility] ORS;  Service: Vascular;  Laterality: Right;  . THYROIDECTOMY    . VARICOCELE EXCISION    . VIDEO BRONCHOSCOPY Bilateral 05/25/2016   Procedure: VIDEO BRONCHOSCOPY WITHOUT FLUORO;  Surgeon: Juanito Doom, MD;  Location[facility] END[facility]COPY;  Service: Cardiopulmonary;  Laterality: Bilateral;       Family History  Problem Relation Age of Onset  . Diabetes Mother   . Diabetes Maternal Grandmother   . Diabetes Maternal Grandfather   . Lung cancer Father   . Emphysema Paternal Grandfather     Social History   Tobacco Use  . Smoking status: Light Tobacco Smoker    Packs/day: 0.25    Years: 40.00    Pack years: 10.00    Types: Cigarettes    Last attempt to quit: 05/25/2016    Years since quitting: 3.1  . Smokeless tobacco: Never Used  Substance Use Topics  . Alcohol use: Yes    Alcohol/week: 8.0 standard drinks    Types: 8 Cans of beer per week  Comment: beer or wine   . Drug use: No    Home Medications Prior to Admission medications   Medication Sig Start Date End Date Taking? Authorizing Provider  atorvastatin (LIPITOR) 80 MG tablet Take 1 tablet (80 mg total) by mouth at bedtime. 05/07/19 06/25/19  Aline August, MD  B Complex-C-Folic Acid (NEPHRO-VITE PO) Take 1 tablet by mouth daily.    [provider]  calcium acetate (PHOSLO) 667 MG capsule Take 2 capsules (1,334 mg total) by mouth 3 (three) times daily with meals. 03/20/19   Loletha Grayer, MD  clopidogrel (PLAVIX) 75 MG tablet Take 75 mg by mouth daily.    [provider]  gentamicin cream (GARAMYCIN) 0.1 % Apply 1 application topically daily. Patient not taking: Reported on 06/23/2019 03/20/19   Loletha Grayer, MD  HYDROcodone-acetaminophen [facility]) 5-325 MG tablet Take 1-2 tablets by mouth every  6 (six) hours as needed for moderate pain or severe pain. 06/26/19   Schnier, Dolores Lory, MD  hydrOXYzine (ATARAX/VISTARIL) 25 MG tablet Take 25 mg by mouth every 6 (six) hours as needed for itching.    [provider]  insulin glargine (LANTUS) 100 UNIT/ML injection Inject 10 Units into the skin at bedtime.     [provider]  insulin lispro (HUMALOG) 100 UNIT/ML KwikPen Inject 0-0.12 mLs (0-12 Units total) into the skin See admin instructions. Inject 0-12 units into the skin three times a day before meals, depending on BGL: <70 = CALL MD; 70-200 = give nothing; 201-250 = 2 units; 251-300 = 4 units; 301-350 = 6 units; 351-400 = 8 units; 401-450 = 10 units; 451-600 = 12 units 05/07/19   Aline August, MD  levETIRAcetam (KEPPRA) 500 MG tablet Take 500 mg by mouth 2 (two) times a day. 09/05/15   [provider]  levothyroxine (SYNTHROID) 175 MCG tablet Take 175 mcg by mouth daily before breakfast.  02/25/19   [provider]  lidocaine-prilocaine (EMLA) cream Apply 1 application topically Every Tuesday,Thursday,and Saturday with dialysis.     [provider]  metoprolol succinate (TOPROL-XL) 50 MG 24 hr tablet Take 1 tablet (50 mg total) by mouth daily. Take with or immediately following a meal. Patient taking differently: Take 50 mg by mouth in the morning and at bedtime. Take with or immediately following a meal. 04/17/19 06/25/19  Kayleen Memos, DO  Pancrelipase, Lip-Prot-Amyl, 24000-76000 units CPEP Take 1 capsule by mouth 3 (three) times daily.    [provider]  pantoprazole (PROTONIX) 40 MG tablet Take 1 tablet (40 mg total) by mouth 2 (two) times daily. 04/16/19 06/25/19  Kayleen Memos, DO  polyethylene glycol powder (GLYCOLAX/MIRALAX) 17 GM/SCOOP powder Take 17 g by mouth daily as needed for moderate constipation.     [provider]  potassium chloride SA (KLOR-CON) 20 MEQ tablet Take 20 mEq by mouth 2 (two) times daily.    [provider]    Allergies    Patient has no known allergies.  Review of Systems   Review of Systems  Gastrointestinal: Positive for blood in stool.  All other systems reviewed and are negative.   Physical Exam Updated Vital Signs BP (!) 163/72   Pulse 92   Temp 98.7 F (37.1 C) (Oral)   Resp 16   Ht 6' (1.829 m)   Wt 81.6 kg   SpO2 96%   BMI 24.41 kg/m   Physical Exam Vitals and nursing note reviewed.  Constitutional:      Comments: Pale,  chronically ill   HENT:     Head: Normocephalic.     Nose: Nose normal.     Mouth/Throat:     Mouth: Mucous membranes are moist.  Eyes:     Comments: Conjunctiva pale   Cardiovascular:     Rate and Rhythm: Normal rate and regular rhythm.     Pulses: Normal pulses.     Heart sounds: Normal heart sounds.  Pulmonary:     Effort: Pulmonary effort is normal.     Breath sounds: Normal breath sounds.  Abdominal:     General: Abdomen is flat.     Palpations: Abdomen is soft.  Genitourinary:    Comments: Rectal- melena, guiac positive  Musculoskeletal:        General: Normal range of motion.     Cervical back: Normal range of motion.  Skin:    General: Skin is warm.     Capillary Refill: Capillary refill takes less than 2 seconds.  Neurological:     General: No focal deficit present.     Mental Status: He is alert.  Psychiatric:        Mood and Affect: Mood normal.        Behavior: Behavior normal.     ED Results / Procedures / Treatments   Labs (all labs ordered are listed, but only abnormal results are displayed) Labs Reviewed  COMPREHENSIVE METABOLIC PANEL - Abnormal; Notable for the following components:      Result Value   Potassium 3.3 (*)    Chloride 96 (*)    Glucose, Bld 152 (*)    Creatinine, Ser 3.38 (*)    Calcium 7.8 (*)    Albumin 3.3 (*)    GFR calc non Af Amer 17 (*)    GFR calc Af Amer 20 (*)    All other components within normal limits  CBC - Abnormal; Notable for the following components:    RBC 2.14 (*)    Hemoglobin 6.7 (*)    HCT 21.0 (*)    All other components within normal limits  GLUCOSE, CAPILLARY - Abnormal; Notable for the following components:   Glucose-Capillary 185 (*)    All other components within normal limits  CBG MONITORING, ED  TYPE AND SCREEN  PREPARE RBC (CROSSMATCH)    EKG None  Radiology No results found.  Procedures Procedures (including critical care time)  CRITICAL CARE Performed by: Wandra Arthurs   Total critical care time: 30 minutes  Critical care time was exclusive of separately billable procedures and treating other patients.  Critical care was necessary to treat or prevent imminent or life-threatening deterioration.  Critical care was time spent personally by me on the following activities: development of treatment plan with patient and/or surrogate as well as nursing, discussions with consultants, evaluation of patient's response to treatment, examination of patient, obtaining history from patient or surrogate, ordering and performing treatments and interventions, ordering and review of laboratory studies, ordering and review of radiographic studies, pulse oximetry and re-evaluation of patient's condition.   Medications Ordered in ED Medications  0.9 %  sodium chloride infusion (has no administration in time range)  pantoprazole (PROTONIX) injection 40 mg (has no administration in time range)    ED Course  I have reviewed the triage vital signs and the nursing notes.  Pertinent labs & imaging results that were available during my care of the patient were reviewed by me and considered in my medical decision making (see chart for details).  MDM Rules/Calculators/A&P                      Kyle [name] is a 69 y.o. male here presenting with anemia.  He was on Plavix and had a AVM that was bleeding.  Patient was put on Plavix upon discharge and now is anemic again.  Patient does have melena that is guaiac positive.  His  hemoglobin here is 6.7.  His kidney function is stable. Talked to Dr. Alice Reichert from GI who will see patient. Hospitalist to admit for GI bleed. Given protonix and ordered 1 U of blood.   Final Clinical Impression(s) / ED Diagnoses Final diagnoses:  None    Rx / DC Orders ED Discharge Orders    None       Drenda Freeze, MD 07/22/19 2159

## 2019-07-22 NOTE — ED Notes (Signed)
Pt sent to the er from dialysis for low hemoglobin.  Pt is a resident at the home place. Pt reports feeling weak and has had dark stools.  Pt is on plavix.  md at bedside.

## 2019-07-23 ENCOUNTER — Encounter: Payer: Self-pay | Admitting: Internal Medicine

## 2019-07-23 DIAGNOSIS — N186 End stage renal disease: Secondary | ICD-10-CM | POA: Diagnosis not present

## 2019-07-23 DIAGNOSIS — D62 Acute posthemorrhagic anemia: Secondary | ICD-10-CM | POA: Diagnosis not present

## 2019-07-23 DIAGNOSIS — Z8673 Personal history of transient ischemic attack (TIA), and cerebral infarction without residual deficits: Secondary | ICD-10-CM | POA: Diagnosis not present

## 2019-07-23 DIAGNOSIS — K921 Melena: Secondary | ICD-10-CM | POA: Diagnosis not present

## 2019-07-23 LAB — BASIC METABOLIC PANEL
Anion gap: 11 (ref 5–15)
BUN: 18 mg/dL (ref 8–23)
CO2: 28 mmol/L (ref 22–32)
Calcium: 7.6 mg/dL — ABNORMAL LOW (ref 8.9–10.3)
Chloride: 99 mmol/L (ref 98–111)
Creatinine, Ser: 5.18 mg/dL — ABNORMAL HIGH (ref 0.61–1.24)
GFR calc Af Amer: 12 mL/min — ABNORMAL LOW (ref >60)
GFR calc non Af Amer: 10 mL/min — ABNORMAL LOW (ref >60)
Glucose, Bld: 134 mg/dL — ABNORMAL HIGH (ref 70–99)
Potassium: 4.4 mmol/L (ref 3.5–5.1)
Sodium: 138 mmol/L (ref 135–145)

## 2019-07-23 LAB — CBC WITH DIFFERENTIAL/PLATELET
Abs Immature Granulocytes: 0.03 10*3/uL (ref 0.00–0.07)
Basophils Absolute: 0.1 10*3/uL (ref 0.0–0.1)
Basophils Relative: 1 %
Eosinophils Absolute: 0.1 10*3/uL (ref 0.0–0.5)
Eosinophils Relative: 2 %
HCT: 26.7 % — ABNORMAL LOW (ref 39.0–52.0)
Hemoglobin: 8.5 g/dL — ABNORMAL LOW (ref 13.0–17.0)
Immature Granulocytes: 1 %
Lymphocytes Relative: 18 %
Lymphs Abs: 1.1 10*3/uL (ref 0.7–4.0)
MCH: 30.2 pg (ref 26.0–34.0)
MCHC: 31.8 g/dL (ref 30.0–36.0)
MCV: 95 fL (ref 80.0–100.0)
Monocytes Absolute: 0.4 10*3/uL (ref 0.1–1.0)
Monocytes Relative: 7 %
Neutro Abs: 4.3 10*3/uL (ref 1.7–7.7)
Neutrophils Relative %: 71 %
Platelets: 205 10*3/uL (ref 150–400)
RBC: 2.81 MIL/uL — ABNORMAL LOW (ref 4.22–5.81)
RDW: 15.9 % — ABNORMAL HIGH (ref 11.5–15.5)
WBC: 6 10*3/uL (ref 4.0–10.5)
nRBC: 0 % (ref 0.0–0.2)

## 2019-07-23 LAB — HEMOGLOBIN A1C
Hgb A1c MFr Bld: 6.4 % — ABNORMAL HIGH (ref 4.8–5.6)
Mean Plasma Glucose: 136.98 mg/dL

## 2019-07-23 LAB — GLUCOSE, CAPILLARY
Glucose-Capillary: 119 mg/dL — ABNORMAL HIGH (ref 70–99)
Glucose-Capillary: 133 mg/dL — ABNORMAL HIGH (ref 70–99)
Glucose-Capillary: 133 mg/dL — ABNORMAL HIGH (ref 70–99)
Glucose-Capillary: 135 mg/dL — ABNORMAL HIGH (ref 70–99)
Glucose-Capillary: 185 mg/dL — ABNORMAL HIGH (ref 70–99)
Glucose-Capillary: 190 mg/dL — ABNORMAL HIGH (ref 70–99)

## 2019-07-23 MED ORDER — CHLORHEXIDINE GLUCONATE CLOTH 2 % EX PADS
6.0000 | MEDICATED_PAD | Freq: Every day | CUTANEOUS | Status: DC
  Administered 2019-07-24 – 2019-07-28 (×2): 6 via TOPICAL
  Filled 2019-07-23: qty 6

## 2019-07-23 NOTE — TOC Initial Note (Signed)
Transition of Care [facility]) - Initial/Assessment Note    Patient Details  Name: Kyle [name] MRN: [id number] Date of Birth: 01-09-1951  [facility] of [facility]) CM/SW Contact:    Victorino Dike, RN Phone Number: 07/23/2019, 2:48 PM  Clinical Narrative:                   Met with wife in room with spouse.  She is seeking SNF placement with possible long term care at discharge from hospital.  She reported Horris Latino from [facility] (ALF) had already started a bed search for patient. [facility] is taking a look.  Waiting on PT/OT eval while in hospital and will complete FL2 and widen bed search.    Expected Discharge Plan: Skilled Nursing Facility Barriers to Discharge: Continued Medical Work up   Patient Goals and CMS Choice     Choice offered to / list presented to : Spouse, Patient  Expected Discharge Plan and Services Expected Discharge Plan: [place]   Discharge Planning Services: CM Consult Post Acute Care Choice: [place] Living arrangements for the past 2 months: [place]                                      Prior Living Arrangements/Services Living arrangements for the past 2 months: [place] Lives with:: Facility Resident Patient language and need for interpreter reviewed:: Yes Do you feel safe going back to the place where you live?: Yes      Need for Family Participation in Patient Care: Yes (Comment) Care giver support system in place?: Yes (comment) Current home services: Home PT, Home OT, Home RN Criminal Activity/Legal Involvement Pertinent to Current Situation/Hospitalization: No - Comment as needed  Activities of Daily Living      Permission Sought/Granted                  Emotional Assessment Appearance:: Appears stated age Attitude/Demeanor/Rapport: Engaged Affect (typically observed): Appropriate Orientation: : Oriented to Self, Oriented to Place, Oriented to  Situation Alcohol / Substance Use: Not Applicable Psych Involvement: No (comment)  Admission diagnosis:  Acute blood loss anemia [D62] Symptomatic anemia [D64.9] Gastrointestinal hemorrhage, unspecified gastrointestinal hemorrhage type [K92.2] Patient Active Problem List   Diagnosis Date Noted  . Melena 07/22/2019  . History of GI bleed 07/22/2019  . Current every day smoker 06/22/2019  . Falls frequently 06/22/2019  . Palliative care by specialist   . Goals of care, counseling/discussion   . Orthostasis 05/02/2019  . ESRD (end stage renal disease) on dialysis ([place])   . Acute blood loss anemia 04/30/2019  . Acute on chronic blood loss anemia 04/29/2019  . History of CVA (cerebrovascular accident) 04/29/2019  . GI bleed 04/04/2019  . 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[place]) 03/20/2019  . Generalized weakness   . Diabetic polyneuropathy associated with type 2 diabetes mellitus ([place])   . Cerebrovascular accident (CVA) ([place])   . PAD (peripheral artery disease) ([place]) 03/17/2019  . Ptosis of eyelid, left 03/15/2019  . Physical deconditioning 03/14/2019  . Hypomagnesemia 03/13/2019  . Hyperphosphatemia 03/13/2019  . ESRD (end stage renal disease) ([place]) 03/13/2019  . Chronic kidney disease with peritoneal dialysis as preferred modality, stage 5 ([place]) 03/13/2019  . Prolonged QT interval 03/13/2019  . Hypertension associated with diabetes ([place]) 03/13/2019  . Hypothyroidism 03/13/2019  . Chronic diarrhea 03/13/2019  . Hypocalcemia 03/12/2019  . Ataxia 03/10/2019  .  Dizziness 03/10/2019  . Vision problem 03/10/2019  . Hypokalemia 09/16/2018  . ESRD on peritoneal dialysis ([place]) 03/04/2018  . Lung nodule 06/12/2016  . Narrowing of airway   . Cigarette smoker 05/24/2016  . Collapse of right lung 05/24/2016  . COPD with chronic bronchitis ([place]) 05/24/2016  . Diarrhea due to malabsorption 07/01/2015  . Hx of adenomatous colonic polyps 07/01/2015  . Acne 05/20/2015  . Calculus of kidney 05/20/2015   . Chest pain, non-cardiac 05/20/2015  . Seizure disorder ([place]) 05/20/2015  . Current tobacco use 05/20/2015  . Chronic kidney disease (CKD), stage III (moderate) 03/29/2015  . Insulin dependent type 2 diabetes mellitus ([place]) 03/15/2015  . Lipoma of shoulder 03/08/2014  . Other synovitis and tenosynovitis, right shoulder 03/08/2014  . Right supraspinatus tenosynovitis 03/08/2014  . Bursitis of elbow 02/15/2014  . Olecranon bursitis of right elbow 02/15/2014  . Absolute anemia 08/08/2013  . 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 Back pain, chronic 08/08/2013  . Chronic obstructive pulmonary disease ([place]) 08/08/2013  . BP (high blood pressure) 08/08/2013  . Hyperlipidemia associated with type 2 diabetes mellitus ([place]) 08/08/2013  . Acne erythematosa 08/08/2013   PCP:  Idelle Crouch, MD Pharmacy:   [facility] [address] [place] [address] Phone: [phone] Fax: [place], [place] [address] [address] Phone: [phone] Fax: [place], [place] [address] Phone: [phone] Fax: [phone]     Social Determinants of Health (SDOH) Interventions    Readmission Risk Interventions No flowsheet data found.

## 2019-07-23 NOTE — Plan of Care (Signed)
  Problem: Education: Goal: Knowledge of General Education information will improve Description: Including pain rating scale, medication(s)/side effects and non-pharmacologic comfort measures 07/23/2019 2127 by Carla Drape, RN Outcome: Progressing 07/23/2019 2127 by Carla Drape, RN Outcome: Progressing   Problem: Health Behavior/Discharge Planning: Goal: Ability to manage health-related needs will improve 07/23/2019 2127 by Carla Drape, RN Outcome: Progressing 07/23/2019 2127 by Carla Drape, RN Outcome: Progressing   Problem: Clinical Measurements: Goal: Ability to maintain clinical measurements within normal limits will improve 07/23/2019 2127 by Carla Drape, RN Outcome: Progressing 07/23/2019 2127 by Carla Drape, RN Outcome: Progressing Goal: Will remain free from infection 07/23/2019 2127 by Carla Drape, RN Outcome: Progressing 07/23/2019 2127 by Carla Drape, RN Outcome: Progressing Goal: Diagnostic test results will improve 07/23/2019 2127 by Carla Drape, RN Outcome: Progressing 07/23/2019 2127 by Carla Drape, RN Outcome: Progressing Goal: Respiratory complications will improve 07/23/2019 2127 by Carla Drape, RN Outcome: Progressing 07/23/2019 2127 by Carla Drape, RN Outcome: Progressing Goal: Cardiovascular complication will be avoided 07/23/2019 2127 by Carla Drape, RN Outcome: Progressing 07/23/2019 2127 by Carla Drape, RN Outcome: Progressing

## 2019-07-23 NOTE — Progress Notes (Signed)
[place], [place] 07/23/19  Subjective:   Hospital day # 0  Patient was sent to the emergency room by PCP for low hemoglobin He received 2 units of blood transfusion yesterday and tolerated it well His last dialysis day was yesterday Currently on room air.  Denies any shortness of breath No leg edema   Objective:  Vital signs in last 24 hours:  Temp:  [98 F (36.7 C)-98.7 F (37.1 C)] 98 F (36.7 C) (05/27 0728) Pulse Rate:  [72-92] 83 (05/27 1151) Resp:  [12-20] 20 (05/27 1151) BP: (143-177)/(55-88) 166/79 (05/27 1151) SpO2:  [96 %-100 %] 98 % (05/27 1151) Weight:  [81.6 kg] 81.6 kg (05/26 1548)  Weight change:  Filed Weights   07/22/19 1548  Weight: 81.6 kg    Intake/Output:    Intake/Output Summary (Last 24 hours) at 07/23/2019 1513 Last data filed at 07/23/2019 0440 Gross per 24 hour  Intake 1260 ml  Output -  Net 1260 ml    Physical Exam: General:  No acute distress, laying in the bed  HEENT  anicteric, moist oral mucous membrane  Pulm/lungs  normal breathing effort, lungs are clear to auscultation  CVS/Heart  regular rhythm, no rub or gallop  Abdomen:   Soft, nontender  Extremities:  No peripheral edema  Neurologic:  Alert, oriented, able to follow commands  Skin:  No acute rashes     Basic Metabolic Panel:  Recent Labs  Lab 07/22/19 1554 07/23/19 1212  NA 136 138  K 3.3* 4.4  CL 96* 99  CO2 28 28  GLUCOSE 152* 134*  BUN 11 18  CREATININE 3.38* 5.18*  CALCIUM 7.8* 7.6*     CBC: Recent Labs  Lab 07/22/19 1554 07/23/19 1212  WBC 5.1 6.0  NEUTROABS  --  4.3  HGB 6.7* 8.5*  HCT 21.0* 26.7*  MCV 98.1 95.0  PLT 229 205      Lab Results  Component Value Date   HEPBSAG NON REACTIVE 05/01/2019   HEPBSAB Reactive (A) 05/05/2019      Microbiology:  Recent Results (from the past 240 hour(s))  SARS Coronavirus 2 by RT PCR (hospital order, performed in [place] hospital lab) Nasopharyngeal  Nasopharyngeal Swab     Status: None   Collection Time: 07/22/19 10:17 PM   Specimen: Nasopharyngeal Swab  Result Value Ref Range Status   SARS Coronavirus 2 NEGATIVE NEGATIVE Final    Comment: (NOTE) SARS-CoV-2 target nucleic acids are NOT DETECTED. The SARS-CoV-2 RNA is generally detectable in upper and lower respiratory specimens during the acute phase of infection. The lowest concentration of SARS-CoV-2 viral copies this assay can detect is 250 copies / mL. A negative result does not preclude SARS-CoV-2 infection and should not be used as the sole basis for treatment or other patient management decisions.  A negative result may occur with improper specimen collection / handling, submission of specimen other than nasopharyngeal swab, presence of viral mutation(s) within the areas targeted by this assay, and inadequate number of viral copies (<250 copies / mL). A negative result must be combined with clinical observations, patient history, and epidemiological information. Fact Sheet for Patients:   StrictlyIdeas.no Fact Sheet for Healthcare Providers: BankingDealers.co.za This test is not yet approved or cleared  by the [place] FDA and has been authorized for detection and/or diagnosis of SARS-CoV-2 by FDA under an Emergency Use Authorization (EUA).  This EUA will remain in effect (meaning this test can be used) for the duration of  the COVID-19 declaration under Section 564(b)(1) of the Act, 21 U.S.C. section 360bbb-3(b)(1), unless the authorization is terminated or revoked sooner. Performed at [facility], [address] [place], [place] [address]     Coagulation Studies: No results for input(s): LABPROT, INR in the last 72 hours.  Urinalysis: No results for input(s): COLORURINE, LABSPEC, PHURINE, GLUCOSEU, HGBUR, BILIRUBINUR, KETONESUR, PROTEINUR, UROBILINOGEN, NITRITE, LEUKOCYTESUR in the last 72  hours.  Invalid input(s): APPERANCEUR    Imaging: No results found.   Medications:    . sodium chloride   Intravenous Once  . atorvastatin  80 mg Oral q1800  . calcium acetate  1,334 mg Oral TID WC  . Chlorhexidine Gluconate Cloth  6 each Topical Q0600  . insulin aspart  0-9 Units Subcutaneous Q4H  . levETIRAcetam  500 mg Oral BID  . levothyroxine  175 mcg Oral QAC breakfast  . metoprolol succinate  50 mg Oral Daily  . pantoprazole (PROTONIX) IV  40 mg Intravenous q1800   acetaminophen **OR** acetaminophen  Assessment/ Plan:  69 y.o. 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  Hold if n.p.o.    LOS: 0   5/27/20213:13 PM  [place], [place]  Note: This note was prepared with Dragon dictation. Any transcription errors are unintentional

## 2019-07-23 NOTE — Consult Note (Signed)
Jonathon Bellows , MD [address] [place], [place], [place], [address] [address], [place], [place], [place], [address] Phone: [phone]  Fax: [phone]  Consultation  Referring Provider:     Dr Damita Dunnings Primary Care Physician:  Idelle Crouch, MD Primary Gastroenterologist:  Dr. Alice Reichert         Reason for Consultation:     GI bleed  Date of Admission:  07/22/2019 Date of Consultation:  07/23/2019         HPI:   Kyle [name] is a 69 y.o. male who is known to Dr. Ricky Stabs office and last seen at their office in January 2021 for diarrhea.  Colonoscopy in October 2020 with Dr. Alice Reichert demonstrated multiple small and large mouth diverticula entire colon.  Lipoma in transverse colon.  Anda few  subcentimeter polyps were resected.  Tubulovillous adenomas noted and suggested repeat in 3 years.   In February 2021 admitted to [facility] with a GI bleed.Hemoglobin 5.8 g.  Had been on Plavix.  EGD demonstrated severe duodenitis and gastritis.  In February 2021 underwent a colonoscopy at [facility] single small AVM was noted in the transverse colon that was ablated.  Diverticulosis of the colon was noted.Readmitted 2 weeks later on 04/30/2019 with anemia and melena underwent a capsule study. 4-5 areas of tiny clumps of blood were seen in the proximal small bowel.  No.  AVMs were noted.  The following day push enteroscopy was performed.AVM was seen in the duodenum and was ablated.  Could not proceed deeper due to looping.  This admission was admitted for melena from dialysis.  Hemoglobin 6.7 g.  Hemoglobin was 7.9 g 2 months back.  Normal BUN/creatinine ratio.  Has been on Plavix. Last episode of melena per patient was 2 weeks back , denies any hematemesis, NSAID use . History provided by family.   Past Medical History:  Diagnosis Date  . Anemia   . Anxiety   . BPH (benign prostatic hyperplasia)   . Chronic kidney disease   . Colon polyps   . COPD (chronic obstructive pulmonary disease)  ([place])   . Depression   . Diabetes mellitus without complication ([place])   . Diverticulosis of colon   . History of kidney stones   . Hyperlipidemia   . Hypertension   . Hypothyroidism   . Nephrolithiasis   . Seizures ([place])    2015 last seizure  . Stroke [facility])     Past Surgical History:  Procedure Laterality Date  . AV FISTULA PLACEMENT Left 03/30/2019   Procedure: right brachiocephalic fistula creation;  Surgeon: Marty Heck, MD;  Location: [place];  Service: Vascular;  Laterality: Left;  . BIOPSY  04/05/2019   Procedure: BIOPSY;  Surgeon: Wilford Corner, MD;  Location: [place];  Service: Endoscopy;;  . COLONOSCOPY    . COLONOSCOPY WITH PROPOFOL N/A 05/30/2015   Procedure: COLONOSCOPY WITH PROPOFOL;  Surgeon: Manya Silvas, MD;  Location: [facility] ENDOSCOPY;  Service: Endoscopy;  Laterality: N/A;  . COLONOSCOPY WITH PROPOFOL N/A 12/01/2018   Procedure: COLONOSCOPY WITH PROPOFOL;  Surgeon: Toledo, [name], MD;  Location: [facility] ENDOSCOPY;  Service: Gastroenterology;  Laterality: N/A;  . COLONOSCOPY WITH PROPOFOL N/A 04/09/2019   Procedure: COLONOSCOPY WITH PROPOFOL;  Surgeon: Carol Ada, MD;  Location: [place];  Service: Endoscopy;  Laterality: N/A;  . ENTEROSCOPY N/A 05/05/2019   Procedure: ENTEROSCOPY;  Surgeon: Ronnette Juniper, MD;  Location: [facility] ENDOSCOPY;  Service: Gastroenterology;  Laterality: N/A;  Push enteroscopy  .  ESOPHAGOGASTRODUODENOSCOPY (EGD) WITH PROPOFOL N/A 04/05/2019   Procedure: ESOPHAGOGASTRODUODENOSCOPY (EGD) WITH PROPOFOL;  Surgeon: Wilford Corner, MD;  Location: [place];  Service: Endoscopy;  Laterality: N/A;  . GIVENS CAPSULE STUDY N/A 05/01/2019   Procedure: GIVENS CAPSULE STUDY;  Surgeon: Ronald Lobo, MD;  Location: [place];  Service: Endoscopy;  Laterality: N/A;  . HOT HEMOSTASIS N/A 04/09/2019   Procedure: HOT HEMOSTASIS (ARGON PLASMA COAGULATION/BICAP);  Surgeon: Carol Ada, MD;  Location: [place];  Service: Endoscopy;   Laterality: N/A;  . HOT HEMOSTASIS N/A 05/05/2019   Procedure: HOT HEMOSTASIS (ARGON PLASMA COAGULATION/BICAP);  Surgeon: Ronnette Juniper, MD;  Location: [place];  Service: Gastroenterology;  Laterality: N/A;  . INSERTION OF DIALYSIS CATHETER Right 03/30/2019   Procedure: Ultrasound guided right internal jugular tunneled dialysis catheter placement;  Surgeon: Marty Heck, MD;  Location: [facility] OR;  Service: Vascular;  Laterality: Right;  . kidney stone    . REMOVAL OF A DIALYSIS CATHETER Right 06/26/2019   Procedure: REMOVAL OF A DIALYSIS CATHETER ( PD CATH REMOVAL AND I J CATH REMOVAL;  Surgeon: Katha Cabal, MD;  Location: [facility] ORS;  Service: Vascular;  Laterality: Right;  . THYROIDECTOMY    . VARICOCELE EXCISION    . VIDEO BRONCHOSCOPY Bilateral 05/25/2016   Procedure: VIDEO BRONCHOSCOPY WITHOUT FLUORO;  Surgeon: Juanito Doom, MD;  Location: [facility] ENDOSCOPY;  Service: Cardiopulmonary;  Laterality: Bilateral;    Prior to Admission medications   Medication Sig Start Date End Date Taking? Authorizing Provider  atorvastatin (LIPITOR) 80 MG tablet Take 1 tablet (80 mg total) by mouth at bedtime. 05/07/19 07/22/19 Yes Aline August, MD  B Complex-C-Folic Acid (NEPHRO-VITE PO) Take 1 tablet by mouth daily.   Yes [provider]  calcium acetate (PHOSLO) 667 MG capsule Take 2 capsules (1,334 mg total) by mouth 3 (three) times daily with meals. Patient taking differently: Take 1,334 mg by mouth in the morning and at bedtime.  03/20/19  Yes Wieting, Richard, MD  clopidogrel (PLAVIX) 75 MG tablet Take 75 mg by mouth daily.   Yes [provider]  escitalopram (LEXAPRO) 10 MG tablet Take 10 mg by mouth daily. 07/14/19  Yes [provider]  insulin glargine (LANTUS) 100 UNIT/ML injection Inject 15 Units into the skin at bedtime.    Yes [provider]  levETIRAcetam (KEPPRA) 500 MG tablet Take 500 mg by mouth 2 (two) times a day. 09/05/15  Yes [provider]  levothyroxine (SYNTHROID) 200 MCG tablet Take 200 mcg by mouth daily before breakfast.  02/25/19  Yes [provider]  lidocaine-prilocaine (EMLA) cream Apply 1 application topically Every Tuesday,Thursday,and Saturday with dialysis.    Yes [provider]  losartan (COZAAR) 100 MG tablet Take 100 mg by mouth daily. 07/14/19  Yes [provider]  Pancrelipase, Lip-Prot-Amyl, 24000-76000 units CPEP Take 1 capsule by mouth 3 (three) times daily with meals.    Yes [provider]  pantoprazole (PROTONIX) 40 MG tablet Take 1 tablet (40 mg total) by mouth 2 (two) times daily. 04/16/19 07/22/19 Yes Kayleen Memos, DO  traZODone (DESYREL) 100 MG tablet Take 100 mg by mouth at bedtime. 07/14/19  Yes [provider]  gentamicin cream (GARAMYCIN) 0.1 % Apply 1 application topically daily. Patient not taking: Reported on 06/23/2019 03/20/19   Loletha Grayer, MD  HYDROcodone-acetaminophen [facility]) 5-325 MG tablet Take 1-2 tablets by mouth every 6 (six) hours as needed for moderate pain or severe pain. Patient not taking: Reported on 07/22/2019 06/26/19  Schnier, Dolores Lory, MD  hydrOXYzine (ATARAX/VISTARIL) 25 MG tablet Take 25 mg by mouth every 6 (six) hours as needed for itching.    [provider]  insulin lispro (HUMALOG) 100 UNIT/ML KwikPen Inject 0-0.12 mLs (0-12 Units total) into the skin See admin instructions. Inject 0-12 units into the skin three times a day before meals, depending on BGL: <70 = CALL MD; 70-200 = give nothing; 201-250 = 2 units; 251-300 = 4 units; 301-350 = 6 units; 351-400 = 8 units; 401-450 = 10 units; 451-600 = 12 units Patient not taking: Reported on 07/22/2019 05/07/19   Aline August, MD  metoprolol succinate (TOPROL-XL) 50 MG 24 hr tablet Take 1 tablet (50 mg total) by mouth daily. Take with or immediately following a meal. Patient not taking: Reported on 07/22/2019 04/17/19 06/25/19  Kayleen Memos, DO  polyethylene glycol powder  (GLYCOLAX/MIRALAX) 17 GM/SCOOP powder Take 17 g by mouth daily as needed for moderate constipation.     [provider]  potassium chloride SA (KLOR-CON) 20 MEQ tablet Take 20 mEq by mouth 2 (two) times daily.    [provider]    Family History  Problem Relation Age of Onset  . Diabetes Mother   . Diabetes Maternal Grandmother   . Diabetes Maternal Grandfather   . Lung cancer Father   . Emphysema Paternal Grandfather      Social History   Tobacco Use  . Smoking status: Light Tobacco Smoker    Packs/day: 0.25    Years: 40.00    Pack years: 10.00    Types: Cigarettes    Last attempt to quit: 05/25/2016    Years since quitting: 3.1  . Smokeless tobacco: Never Used  Substance Use Topics  . Alcohol use: Yes    Alcohol/week: 8.0 standard drinks    Types: 8 Cans of beer per week    Comment: beer or wine   . Drug use: No    Allergies as of 07/22/2019  . (No Known Allergies)    Review of Systems:    All systems reviewed and negative except where noted in HPI.   Physical Exam:  Vital signs in last 24 hours: Temp:  [98 F (36.7 C)-98.7 F (37.1 C)] 98 F (36.7 C) (05/27 0728) Pulse Rate:  [72-92] 77 (05/27 0728) Resp:  [12-20] 12 (05/27 0728) BP: (143-177)/(55-88) 166/76 (05/27 0728) SpO2:  [96 %-100 %] 100 % (05/27 0728) Weight:  [81.6 kg] 81.6 kg (05/26 1548)   General:   Pleasant, cooperative in NAD Head:  Normocephalic and atraumatic. Eyes:   No icterus.   Conjunctiva pink. PERRLA. Ears:  Normal auditory acuity. Neck:  Supple; no masses or thyroidomegaly Lungs: Respirations even and unlabored. Lungs clear to auscultation bilaterally.   No wheezes, crackles, or rhonchi.  Heart:  Regular rate and rhythm;  Without murmur, clicks, rubs or gallops Abdomen:  Soft, nondistended, nontender. Normal bowel sounds. No appreciable masses or hepatomegaly.  No rebound or guarding.  Neurologic:  Alert and oriented x2;  grossly normal neurologically. Skin:   Intact without significant lesions or rashes. Cervical Nodes:  No significant cervical adenopathy. Psych:  Alert and cooperative. Normal affect.  LAB RESULTS: Recent Labs    07/22/19 1554  WBC 5.1  HGB 6.7*  HCT 21.0*  PLT 229   BMET Recent Labs    07/22/19 1554  NA 136  K 3.3*  CL 96*  CO2 28  GLUCOSE 152*  BUN 11  CREATININE 3.38*  CALCIUM 7.8*  LFT Recent Labs    07/22/19 1554  PROT 6.5  ALBUMIN 3.3*  AST 19  ALT 12  ALKPHOS 115  BILITOT 0.6   PT/INR No results for input(s): LABPROT, INR in the last 72 hours.  STUDIES: No results found.    Impression / Plan:   Kyle [name] is a 69 y.o. y/o male with history of CVA, ESRD on dialysis on Plavix presents to the emergency room with melena.  1 g drop in hemoglobin from baseline.  No elevated BUN/creatinine ratio.  In February and March 2021 admitted with 2 episodes of melena.  Had colonoscopy, EGD, push enteroscopy, capsule study and was treated for AVMs of the colon and of the proximal small bowel.  Plan 1.  Once off Plavix for at least 3 days we will proceed with push enteroscopy and if negative will need capsule study of the small bowel.  2.  Monitor CBC and transfuse as needed.  If there is acute bleeding then performed tagged RBC scan.  Thank you for involving me in the care of this patient.      LOS: 0 days   Jonathon Bellows, MD  07/23/2019, 9:27 AM

## 2019-07-23 NOTE — Progress Notes (Signed)
Progress Note    Kyle [name]  QZE:[id number] DOB: May 01, 1950  DOA: 07/22/2019 PCP: Idelle Crouch, MD      Brief Narrative:    Medical records reviewed and are as summarized below:  Kyle [name] is a 69 y.o. 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  He has had extensive work-up including upper and lower endoscopy, capsule endoscopy and push enteroscopy.  Work-up had revealed nonbleeding AVM that was treated with APC.  He returned to the emergency room on 07/22/2019 withintermittent melena stool that has been going on for several weeks.  He was referred from the outpatient dialysis center because hemoglobin was 5.7.      Assessment/Plan:   Principal Problem:   Melena Active Problems:   Insulin dependent type 2 diabetes mellitus (HCC)   COPD with chronic bronchitis (HCC)   ESRD (end stage renal disease) (HCC)   PAD (peripheral artery disease) (HCC)   Diabetic polyneuropathy associated with type 2 diabetes mellitus ([place])   History of CVA (cerebrovascular accident)   Acute blood loss anemia   ESRD (end stage renal disease) on dialysis ([place])   History of GI bleed   Melena Acute on chronic blood loss anemia   History of recurrent GI bleed Initial hemoglobin in the ED was 6.7. s/p this patient with 2 units of PRBCs on 07/22/2019.  Hemoglobin improved to 8.5.  Monitor H&H.   Continue IV Protonix.  Plavix has been held with plan for push enteroscopy when he has been off of Plavix for at least 3 days.  -Patient had 2 recent admission for GI bleeding with EGD, colonoscopy, capsule endoscopy and push enteroscopy significant for single nonbleeding colonic  angiodysplastic lesion status post monopolar probe treatment.     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  Follow-up with nephrologist.  Essential hypotension Continue antihypertensives  Diabetes mellitus type 2 with hyperlipidemia -Sliding scale insulin coverage Hemoglobin A1c 6.4  Hypothyroidism -Continue Synthroid  Seizure disorder Continue Keppra  Body mass index is 24.41 kg/m.   Family Communication/Anticipated D/C date and plan/Code Status   DVT prophylaxis: SCD Code Status: Full code Family Communication: Plan discussed with patient Disposition Plan:    Status is: Observation  The patient will require care spanning > 2 midnights and should be moved to inpatient because: Unsafe d/c plan and Inpatient level of care appropriate due to severity of illness  Dispo: The patient is from: Home              Anticipated d/c is to: Home              Anticipated d/c date is: > 3 days              Patient currently is not medically stable to d/c.           Subjective:   No hematemesis or bloody stools today.  No abdominal pain.  No shortness of breath or chest pain.  Objective:    Vitals:   07/23/19 0441 07/23/19 0500 07/23/19 0728 07/23/19 1151  BP: (!) 169/88 (!) 172/82 [name])  166/76 (!) 166/79  Pulse: 73 73 77 83  Resp: 18 20 12 20   Temp: 98.1 F (36.7 C)  98 F (36.7 C) 98 F (36.7 C)  TempSrc:   Oral Oral  SpO2: 99% 96% 100% 98%  Weight:      Height:       No data found.   Intake/Output Summary (Last 24 hours) at 07/23/2019 1630 Last data filed at 07/23/2019 1600 Gross per 24 hour  Intake 1630 ml  Output --  Net 1630 ml   Filed Weights   07/22/19 1548  Weight: 81.6 kg    Exam:  GEN: NAD SKIN: No rash EYES: Pale but  anicteric ENT: MMM CV: RRR PULM: CTA B ABD: soft, ND, NT, +BS CNS: AAO x 3 EXT: No edema or tenderness   Data Reviewed:   I have personally reviewed following labs and imaging studies:  Labs: Labs show the following:   Basic Metabolic Panel: Recent Labs  Lab 07/22/19 1554 07/23/19 1212  NA 136 138  K 3.3* 4.4  CL 96* 99  CO2 28 28  GLUCOSE 152* 134*  BUN 11 18  CREATININE 3.38* 5.18*  CALCIUM 7.8* 7.6*   GFR Estimated Creatinine Clearance: 14.4 mL/min (A) (by C-G formula based on SCr of 5.18 mg/dL (H)). Liver Function Tests: Recent Labs  Lab 07/22/19 1554  AST 19  ALT 12  ALKPHOS 115  BILITOT 0.6  PROT 6.5  ALBUMIN 3.3*   No results for input(s): LIPASE, AMYLASE in the last 168 hours. No results for input(s): AMMONIA in the last 168 hours. Coagulation profile No results for input(s): INR, PROTIME in the last 168 hours.  CBC: Recent Labs  Lab 07/22/19 1554 07/23/19 1212  WBC 5.1 6.0  NEUTROABS  --  4.3  HGB 6.7* 8.5*  HCT 21.0* 26.7*  MCV 98.1 95.0  PLT 229 205   Cardiac Enzymes: No results for input(s): CKTOTAL, CKMB, CKMBINDEX, TROPONINI in the last 168 hours. BNP (last 3 results) No results for input(s): PROBNP in the last 8760 hours. CBG: Recent Labs  Lab 07/22/19 2103 07/23/19 0029 07/23/19 0414 07/23/19 0728  GLUCAP 185* 133* 135* 119*   D-Dimer: No results for input(s): DDIMER in the last 72 hours. Hgb A1c: Recent Labs    07/22/19 1554  HGBA1C 6.4*   Lipid Profile: No results for input(s): CHOL, HDL, LDLCALC, TRIG, CHOLHDL, LDLDIRECT in the last 72 hours. Thyroid function studies: No results for input(s): TSH, T4TOTAL, T3FREE, THYROIDAB in the last 72 hours.  Invalid input(s): FREET3 Anemia work up: No results for input(s): VITAMINB12, FOLATE, FERRITIN, TIBC, IRON, RETICCTPCT in the last 72 hours. Sepsis Labs: Recent Labs  Lab 07/22/19 1554 07/23/19 1212  WBC 5.1 6.0    Microbiology Recent Results (from the past  240 hour(s))  SARS Coronavirus 2 by RT PCR (hospital order, performed in [facility] hospital lab) Nasopharyngeal Nasopharyngeal Swab     Status: None   Collection Time: 07/22/19 10:17 PM   Specimen: Nasopharyngeal Swab  Result Value Ref Range Status   SARS Coronavirus 2 NEGATIVE NEGATIVE Final    Comment: (NOTE) SARS-CoV-2 target nucleic acids are NOT DETECTED. The SARS-CoV-2 RNA is generally detectable in upper and lower respiratory specimens during the acute phase of infection. The lowest concentration of SARS-CoV-2 viral copies this assay can detect is 250 copies / mL. A negative result does not preclude SARS-CoV-2 infection and should not be used as the sole basis for treatment or other patient management decisions.  A negative result may occur with improper specimen collection / handling, submission of specimen other than nasopharyngeal swab, presence of viral mutation(s) within the areas targeted by this assay, and inadequate number of viral copies (<250 copies / mL). A negative result must be combined with clinical observations, patient history, and epidemiological information. Fact Sheet for Patients:   StrictlyIdeas.no Fact Sheet for Healthcare Providers: BankingDealers.co.za This test is not yet approved or cleared  by the [place] FDA and has been authorized for detection and/or diagnosis of SARS-CoV-2 by FDA under an Emergency Use Authorization (EUA).  This EUA will remain in effect (meaning this test can be used) for the duration of the COVID-19 declaration under Section 564(b)(1) of the Act, 21 U.S.C. section 360bbb-3(b)(1), unless the authorization is terminated or revoked sooner. Performed at [facility], [address] [place], [place] [address]     Procedures and diagnostic studies:  No results found.  Medications:   . sodium chloride   Intravenous Once  . atorvastatin  80 mg Oral q1800  .  calcium acetate  1,334 mg Oral TID WC  . Chlorhexidine Gluconate Cloth  6 each Topical Q0600  . insulin aspart  0-9 Units Subcutaneous Q4H  . levETIRAcetam  500 mg Oral BID  . levothyroxine  175 mcg Oral QAC breakfast  . metoprolol succinate  50 mg Oral Daily  . pantoprazole (PROTONIX) IV  40 mg Intravenous q1800   Continuous Infusions:   LOS: 0 days   Edwin Cherian  [facility]     07/23/2019, 4:30 PM

## 2019-07-23 NOTE — Progress Notes (Signed)
Patient is currently followed by [facility] NP at [facility].TOC Ginnie Russoli notified. Thank you. Flo Shanks BSN, RN, [place] [phone]

## 2019-07-24 DIAGNOSIS — N186 End stage renal disease: Secondary | ICD-10-CM | POA: Diagnosis not present

## 2019-07-24 DIAGNOSIS — I739 Peripheral vascular disease, unspecified: Secondary | ICD-10-CM

## 2019-07-24 DIAGNOSIS — K921 Melena: Secondary | ICD-10-CM | POA: Diagnosis not present

## 2019-07-24 DIAGNOSIS — D62 Acute posthemorrhagic anemia: Secondary | ICD-10-CM | POA: Diagnosis not present

## 2019-07-24 LAB — TYPE AND SCREEN
ABO/RH(D): O POS
Antibody Screen: NEGATIVE
Unit division: 0
Unit division: 0

## 2019-07-24 LAB — CBC WITH DIFFERENTIAL/PLATELET
Abs Immature Granulocytes: 0.03 K/uL (ref 0.00–0.07)
Basophils Absolute: 0.1 K/uL (ref 0.0–0.1)
Basophils Relative: 1 %
Eosinophils Absolute: 0.2 K/uL (ref 0.0–0.5)
Eosinophils Relative: 3 %
HCT: 25.4 % — ABNORMAL LOW (ref 39.0–52.0)
Hemoglobin: 8.2 g/dL — ABNORMAL LOW (ref 13.0–17.0)
Immature Granulocytes: 0 %
Lymphocytes Relative: 16 %
Lymphs Abs: 1.1 K/uL (ref 0.7–4.0)
MCH: 30.4 pg (ref 26.0–34.0)
MCHC: 32.3 g/dL (ref 30.0–36.0)
MCV: 94.1 fL (ref 80.0–100.0)
Monocytes Absolute: 0.6 K/uL (ref 0.1–1.0)
Monocytes Relative: 8 %
Neutro Abs: 5.3 K/uL (ref 1.7–7.7)
Neutrophils Relative %: 72 %
Platelets: 188 K/uL (ref 150–400)
RBC: 2.7 MIL/uL — ABNORMAL LOW (ref 4.22–5.81)
RDW: 15.9 % — ABNORMAL HIGH (ref 11.5–15.5)
WBC: 7.3 K/uL (ref 4.0–10.5)
nRBC: 0 % (ref 0.0–0.2)

## 2019-07-24 LAB — GLUCOSE, CAPILLARY
Glucose-Capillary: 132 mg/dL — ABNORMAL HIGH (ref 70–99)
Glucose-Capillary: 132 mg/dL — ABNORMAL HIGH (ref 70–99)
Glucose-Capillary: 142 mg/dL — ABNORMAL HIGH (ref 70–99)
Glucose-Capillary: 231 mg/dL — ABNORMAL HIGH (ref 70–99)
Glucose-Capillary: 277 mg/dL — ABNORMAL HIGH (ref 70–99)
Glucose-Capillary: 141 mg/dL — ABNORMAL HIGH (ref 70–99)

## 2019-07-24 LAB — BPAM RBC
Blood Product Expiration Date: 202106282359
Blood Product Expiration Date: 202106302359
ISSUE DATE / TIME: 202105262210
ISSUE DATE / TIME: 202105270133
Unit Type and Rh: 5100
Unit Type and Rh: 5100

## 2019-07-24 LAB — MRSA PCR SCREENING: MRSA by PCR: NEGATIVE

## 2019-07-24 MED ORDER — DOXYLAMINE SUCCINATE (SLEEP) 25 MG PO TABS
25.0000 mg | ORAL_TABLET | Freq: Every evening | ORAL | Status: DC | PRN
  Administered 2019-07-24: 25 mg via ORAL
  Filled 2019-07-24 (×2): qty 1

## 2019-07-24 MED ORDER — HEPARIN SODIUM (PORCINE) 1000 UNIT/ML DIALYSIS
20.0000 [IU]/kg | INTRAMUSCULAR | Status: DC | PRN
  Filled 2019-07-24: qty 2

## 2019-07-24 MED ORDER — SODIUM CHLORIDE 0.9% FLUSH
10.0000 mL | Freq: Two times a day (BID) | INTRAVENOUS | Status: DC
  Administered 2019-07-24 – 2019-07-29 (×9): 10 mL via INTRAVENOUS

## 2019-07-24 NOTE — Progress Notes (Signed)
PT Cancellation Note  Patient Details Name: Kyle [name] MRN: [id number] DOB: [birth year]   Cancelled Treatment:    Reason Eval/Treat Not Completed: Patient at procedure or test/unavailable.  Pt currently off unit at dialysis.  Will re-attempt PT evaluation at a later date/time as able.  Leitha Bleak, PT 07/24/19, 10:11 AM

## 2019-07-24 NOTE — Progress Notes (Signed)
This note also relates to the following rows which could not be included: Pulse Rate - Cannot attach notes to unvalidated device data Resp - Cannot attach notes to unvalidated device data SpO2 - Cannot attach notes to unvalidated device data  Hd started  

## 2019-07-24 NOTE — Progress Notes (Signed)
Hemodialysis patient known at [facility], also known as [address] MWF 10:15am. Per clinic patient was staying at [facility] but was recently accepted at [facility] for rehab and possible long term. Please contact me with any dialysis placement concerns.   Elvera Bicker Dialysis Coordinator [phone]

## 2019-07-24 NOTE — Progress Notes (Signed)
Progress Note    Kyle [name]  EXB:[id number] DOB: [birth year]  DOA: 07/22/2019 PCP: Idelle Crouch, MD      Brief Narrative:    Medical records reviewed and are as summarized below:  Kyle [name] is a 69 y.o. 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  He has had extensive work-up including upper and lower endoscopy, capsule endoscopy and push enteroscopy.  Work-up had revealed nonbleeding AVM that was treated with APC.  He returned to the emergency room on 07/22/2019 withintermittent melena stool that has been going on for several weeks.  He was referred from the outpatient dialysis center because hemoglobin was 5.7.      Assessment/Plan:   Principal Problem:   Melena Active Problems:   Insulin dependent type 2 diabetes mellitus (HCC)   COPD with chronic bronchitis (HCC)   ESRD (end stage renal disease) (HCC)   PAD (peripheral artery disease) (HCC)   Diabetic polyneuropathy associated with type 2 diabetes mellitus ([place])   History of CVA (cerebrovascular accident)   Acute blood loss anemia   ESRD (end stage renal disease) on dialysis ([place])   History of GI bleed   Melena Acute on chronic blood loss anemia   History of recurrent GI bleed Initial hemoglobin in the ED was 6.7. s/p this patient with 2 units of PRBCs on 07/22/2019.  Hemoglobin improved. Monitor H&H.   Continue IV Protonix.  Plavix has been held with plan for push enteroscopy when he has been off of Plavix for at least 3 days.  -Patient had 2 recent admission for GI bleeding with EGD, colonoscopy, capsule endoscopy and push enteroscopy significant for single nonbleeding colonic  angiodysplastic lesion status post monopolar probe treatment.     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  Essential hypotension Continue antihypertensives  Diabetes mellitus type 2 with hyperlipidemia -Sliding scale insulin coverage Hemoglobin A1c 6.4  Hypothyroidism -Continue Synthroid  Seizure disorder Continue Keppra  Body mass index is 26.13 kg/m.   Family Communication/Anticipated D/C date and plan/Code Status   DVT prophylaxis: SCD Code Status: Full code Family Communication: Plan discussed with patient Disposition Plan:    Status is: Observation  The patient will require care spanning > 2 midnights and should be moved to inpatient because: Unsafe d/c plan and Inpatient level of care appropriate due to severity of illness  Dispo: The patient is from: Home              Anticipated d/c is to: Home              Anticipated d/c date is: > 3 days              Patient currently is not medically stable to d/c.           Subjective:   No complaints.  No bloody stools or hematemesis.  Objective:    Vitals:   07/24/19 1315 07/24/19 1343 07/24/19 1347 07/24/19 1603  BP: (!) 150/49  (!) 159/70 (!) 154/75  Pulse: 77  78 77  Resp: 16  17 18   Temp:  99.7 F (37.6 C)  97.7 F (36.5 C) 98.2 F (36.8 C)  TempSrc: Oral   Oral  SpO2:   100% 97%  Weight:  87.4 kg    Height:       No data found.   Intake/Output Summary (Last 24 hours) at 07/24/2019 1802 Last data filed at 07/24/2019 1315 Gross per 24 hour  Intake 360 ml  Output 0 ml  Net 360 ml   Filed Weights   07/23/19 1721 07/24/19 0422 07/24/19 1343  Weight: 83.8 kg 86.6 kg 87.4 kg    Exam:  GEN: NAD SKIN: No rash EYES: Pale but anicteric,PERRLA ENT: MMM CV:  RRR PULM: CTA B ABD: soft, ND, NT, +BS CNS: AAO x 3 EXT: No edema or tenderness   Data Reviewed:   I have personally reviewed following labs and imaging studies:  Labs: Labs show the following:   Basic Metabolic Panel: Recent Labs  Lab 07/22/19 1554 07/23/19 1212  NA 136 138  K 3.3* 4.4  CL 96* 99  CO2 28 28  GLUCOSE 152* 134*  BUN 11 18  CREATININE 3.38* 5.18*  CALCIUM 7.8* 7.6*   GFR Estimated Creatinine Clearance: 14.4 mL/min (A) (by C-G formula based on SCr of 5.18 mg/dL (H)). Liver Function Tests: Recent Labs  Lab 07/22/19 1554  AST 19  ALT 12  ALKPHOS 115  BILITOT 0.6  PROT 6.5  ALBUMIN 3.3*   No results for input(s): LIPASE, AMYLASE in the last 168 hours. No results for input(s): AMMONIA in the last 168 hours. Coagulation profile No results for input(s): INR, PROTIME in the last 168 hours.  CBC: Recent Labs  Lab 07/22/19 1554 07/23/19 1212 07/24/19 0600  WBC 5.1 6.0 7.3  NEUTROABS  --  4.3 5.3  HGB 6.7* 8.5* 8.2*  HCT 21.0* 26.7* 25.4*  MCV 98.1 95.0 94.1  PLT 229 205 188   Cardiac Enzymes: No results for input(s): CKTOTAL, CKMB, CKMBINDEX, TROPONINI in the last 168 hours. BNP (last 3 results) No results for input(s): PROBNP in the last 8760 hours. CBG: Recent Labs  Lab 07/23/19 2313 07/24/19 0421 07/24/19 0746 07/24/19 1358 07/24/19 1637  GLUCAP 133* 132* 132* 142* 231*   D-Dimer: No results for input(s): DDIMER in the last 72 hours. Hgb A1c: Recent Labs    07/22/19 1554  HGBA1C 6.4*   Lipid Profile: No results for input(s): CHOL, HDL, LDLCALC, TRIG, CHOLHDL, LDLDIRECT in the last 72 hours. Thyroid function studies: No results for input(s): TSH, T4TOTAL, T3FREE, THYROIDAB in the last 72 hours.  Invalid input(s): FREET3 Anemia work up: No results for input(s): VITAMINB12, FOLATE, FERRITIN, TIBC, IRON, RETICCTPCT in the last 72 hours. Sepsis Labs: Recent Labs  Lab 07/22/19 1554 07/23/19 1212 07/24/19 0600  WBC 5.1  6.0 7.3    Microbiology Recent Results (from the past 240 hour(s))  SARS Coronavirus 2 by RT PCR (hospital order, performed in [facility] hospital lab) Nasopharyngeal Nasopharyngeal Swab     Status: None   Collection Time: 07/22/19 10:17 PM   Specimen: Nasopharyngeal Swab  Result Value Ref Range Status   SARS Coronavirus 2 NEGATIVE NEGATIVE Final    Comment: (NOTE) SARS-CoV-2 target nucleic acids are NOT DETECTED. The SARS-CoV-2 RNA is generally detectable in upper and lower respiratory specimens during the acute phase of infection. The lowest concentration of SARS-CoV-2 viral copies this assay can detect is 250 copies / mL. A negative result does not preclude SARS-CoV-2 infection and should not be used as the sole basis  for treatment or other patient management decisions.  A negative result may occur with improper specimen collection / handling, submission of specimen other than nasopharyngeal swab, presence of viral mutation(s) within the areas targeted by this assay, and inadequate number of viral copies (<250 copies / mL). A negative result must be combined with clinical observations, patient history, and epidemiological information. Fact Sheet for Patients:   StrictlyIdeas.no Fact Sheet for Healthcare Providers: BankingDealers.co.za This test is not yet approved or cleared  by the [place] FDA and has been authorized for detection and/or diagnosis of SARS-CoV-2 by FDA under an Emergency Use Authorization (EUA).  This EUA will remain in effect (meaning this test can be used) for the duration of the COVID-19 declaration under Section 564(b)(1) of the Act, 21 U.S.C. section 360bbb-3(b)(1), unless the authorization is terminated or revoked sooner. Performed at [facility], [address] [place], [place] [address]   MRSA PCR Screening     Status: None   Collection Time: 07/24/19  4:28 AM   Specimen: Nasopharyngeal   Result Value Ref Range Status   MRSA by PCR NEGATIVE NEGATIVE Final    Comment:        The GeneXpert MRSA Assay (FDA approved for NASAL specimens only), is one component of a comprehensive MRSA colonization surveillance program. It is not intended to diagnose MRSA infection nor to guide or monitor treatment for MRSA infections. Performed at [facility], [address] [place], [place] [address]     Procedures and diagnostic studies:  No results found.  Medications:   . sodium chloride   Intravenous Once  . atorvastatin  80 mg Oral q1800  . calcium acetate  1,334 mg Oral TID WC  . Chlorhexidine Gluconate Cloth  6 each Topical Q0600  . insulin aspart  0-9 Units Subcutaneous Q4H  . levETIRAcetam  500 mg Oral BID  . levothyroxine  175 mcg Oral QAC breakfast  . metoprolol succinate  50 mg Oral Daily  . pantoprazole (PROTONIX) IV  40 mg Intravenous q1800   Continuous Infusions:   LOS: 0 days   Kyle [name]  [facility]     07/24/2019, 6:02 PM

## 2019-07-24 NOTE — Progress Notes (Signed)
This note also relates to the following rows which could not be included: Pulse Rate - Cannot attach notes to unvalidated device data Resp - Cannot attach notes to unvalidated device data BP - Cannot attach notes to unvalidated device data  Hd completed  

## 2019-07-24 NOTE — Evaluation (Signed)
Occupational Therapy Evaluation Patient Details Name: Kyle [name] MRN: [id number] DOB: 1950/11/02 Today's Date: 07/24/2019    History of Present Illness  Kyle [name] is a 69 y.o. 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    Clinical Impression   Kyle [name] was seen for OT evaluation this date. Prior to hospital admission, pt was requiring use of RW for mobility and assist PRN for ADLs 2/2 residual CVA weakness. Pt presents to acute OT demonstrating impaired ADL performance and functional mobility 2/2 decreased safety awareness, functional strength/balance deficits, and decreased fine motor control. Upon entry pt found seated EOB eating breakfast tray, requiring SETUP for self-feeding finger foods - pt reports increased assist required for utensils r/t visual deficits (cataracts). Pt currently requires MIN A for LBD seated EOB, anticipate increased assist required in standing 2/2 poor balance noted this date. MOD I bed mobility c HOB elevated. Pt unable to rise sit>stand w/o assist, initially attempted w/o AD and pt required MIN A, decreasing to +2 HHA to achieve upright posture - pt maintained stable balance c +2 HHA for side steps at EOB. Anticipate increased assist for standing grooming ADL tasks. Further mobility deferred by arrival of transport to HD. Pt would benefit from skilled OT to address  noted impairments and functional limitations (see below for any additional details) in order to maximize safety and independence while minimizing falls risk and caregiver burden. Upon hospital discharge, recommend STR to maximize pt safety and return to PLOF.     Follow Up Recommendations  SNF    Equipment Recommendations       Recommendations for Other Services       Precautions / Restrictions Precautions Precautions: None Restrictions Weight Bearing Restrictions: No      Mobility Bed Mobility Overal bed mobility: Modified Independent Bed Mobility: Supine to Sit;Sit to Supine     Supine to sit: Modified independent (Device/Increase time);HOB elevated Sit to supine: Modified independent (Device/Increase time);HOB elevated      Transfers Overall transfer level: Needs assistance Equipment used: 2 person hand held assist Transfers: Sit to/from Stand Sit to Stand: Min assist         General transfer comment: unable to rise w/o assist. MIN A + 2 HHA achieved upright posture c stable balance for side steps at EOB.     Balance Overall balance assessment: Needs assistance Sitting-balance support: No upper extremity supported;Feet supported Sitting balance-Leahy Scale: Normal     Standing balance support: Bilateral upper extremity supported Standing balance-Leahy Scale: Poor                             ADL either performed or assessed with clinical judgement   ADL Overall ADL's : Needs assistance/impaired                                       General ADL Comments: MIN A  LBD seated EOB. SETUP self-feeding seated EOB. CGA + RW standing grooming deferred 2/2 poor balance and arrival of transport      Vision Baseline Vision/History: Cataracts       Perception     Praxis      Pertinent Vitals/Pain Pain Assessment: No/denies pain     Hand Dominance Right   Extremity/Trunk Assessment Upper Extremity Assessment Upper Extremity  Assessment: Overall WFL for tasks assessed   Lower Extremity Assessment Lower Extremity Assessment: Generalized weakness       Communication Communication Communication: No difficulties   Cognition Arousal/Alertness: Awake/alert Behavior During Therapy: WFL for tasks assessed/performed Overall Cognitive Status: Within Functional Limits for tasks assessed                                     General Comments  Reclined in bed: BP 155/71, MAP 95, HR 69 - pt reports chronic dizzyness and occassional instances of double vision     Exercises Exercises: Other exercises Other Exercises Other Exercises: Pt educated re: OT role, d/c recs, DME recs, importance of supervision for OOB mobility Other Exercises: LBD, sefl-feeding/drinking, sup<>sit, sit<>stand, sitting/standing balance/tolerance   Shoulder Instructions      Home Living Family/patient expects to be discharged to:: Assisted living                             Home Equipment: Walker - 2 wheels;Cane - single point;Bedside commode;Shower seat          Prior Functioning/Environment Level of Independence: Needs assistance  Gait / Transfers Assistance Needed: RW at baseline ADL's / Homemaking Assistance Needed: Reports requiring assist PRN for meals, LBD, bathing            OT Problem List: Decreased strength;Decreased activity tolerance;Impaired balance (sitting and/or standing);Impaired vision/perception;Decreased safety awareness;Decreased knowledge of use of DME or AE      OT Treatment/Interventions: Self-care/ADL training;Energy conservation;DME and/or AE instruction;Neuromuscular education;Therapeutic exercise;Therapeutic activities;Visual/perceptual remediation/compensation;Patient/family education;Balance training    OT Goals(Current goals can be found in the care plan section) Acute Rehab OT Goals Patient Stated Goal: To walk better OT Goal Formulation: With patient Time For Goal  Achievement: 08/07/19 Potential to Achieve Goals: Good ADL Goals Pt Will Perform Grooming: with set-up;with supervision;standing(c LRAD PRN) Pt Will Perform Lower Body Dressing: with set-up;sit to/from stand;with min guard assist(c LRAD PRN) Pt Will Transfer to Toilet: with supervision;ambulating;regular height toilet(c LRAD PRN)  OT Frequency: Min 2X/week   Barriers to D/C: Inaccessible home environment;Decreased caregiver support          Co-evaluation              AM-PAC OT "6 Clicks" Daily Activity     Outcome Measure Help from another person eating meals?: None Help from another person taking care of personal grooming?: A Little Help from another person toileting, which includes using toliet, bedpan, or urinal?: A Little Help from another person bathing (including washing, rinsing, drying)?: A Little Help from another person to put on and taking off regular upper body clothing?: A Little Help from another person to put on and taking off regular lower body clothing?: A Little 6 Click Score: 19   End of Session    Activity Tolerance: Patient tolerated treatment well Patient left: in bed;with call bell/phone within reach;with bed alarm set;Other (comment)(Transport in room for HD session)  OT Visit Diagnosis: Other  abnormalities of gait and mobility (R26.89);Unsteadiness on feet (R26.81)                Time: 7412-8786 OT Time Calculation (min): 15 min Charges:  OT General Charges $OT Visit: 1 Visit OT Evaluation $OT Eval Moderate Complexity: 1 Mod OT Treatments $Self Care/Home Management : 8-22 mins  Dessie Coma, M.S. OTR/L  07/24/19, 10:43 AM

## 2019-07-24 NOTE — Progress Notes (Signed)
[place], [place] 07/24/19  Subjective:   Hospital day # 0  Patient was sent to the emergency room by PCP for low hemoglobin He received 2 units of blood transfusion and tolerated it well Currently on room air.  Denies any shortness of breath No leg edema Scheduled for dialysis today   Objective:  Vital signs in last 24 hours:  Temp:  [97.7 F (36.5 C)-98.3 F (36.8 C)] 97.8 F (36.6 C) (05/28 0940) Pulse Rate:  [64-83] 65 (05/28 0945) Resp:  [13-20] 18 (05/28 0945) BP: (142-166)/(55-79) 147/62 (05/28 0945) SpO2:  [96 %-100 %] 97 % (05/28 0744) Weight:  [83.8 kg-86.6 kg] 86.6 kg (05/28 0422)  Weight change: 2.177 kg Filed Weights   07/22/19 1548 07/23/19 1721 07/24/19 0422  Weight: 81.6 kg 83.8 kg 86.6 kg    Intake/Output:    Intake/Output Summary (Last 24 hours) at 07/24/2019 0954 Last data filed at 07/24/2019 0520 Gross per 24 hour  Intake 740 ml  Output 450 ml  Net 290 ml    Physical Exam: General:  No acute distress, laying in the bed  HEENT  anicteric, moist oral mucous membrane  Pulm/lungs  normal breathing effort, lungs are clear to auscultation  CVS/Heart  regular rhythm, no rub or gallop  Abdomen:   Soft, nontender  Extremities:  No peripheral edema  Neurologic:  Alert, oriented, able to follow commands  Skin:  No acute rashes  Left arm AV fistula   Basic Metabolic Panel:  Recent Labs  Lab 07/22/19 1554 07/23/19 1212  NA 136 138  K 3.3* 4.4  CL 96* 99  CO2 28 28  GLUCOSE 152* 134*  BUN 11 18  CREATININE 3.38* 5.18*  CALCIUM 7.8* 7.6*     CBC: Recent Labs  Lab 07/22/19 1554 07/23/19 1212 07/24/19 0600  WBC 5.1 6.0 7.3  NEUTROABS  --  4.3 5.3  HGB 6.7* 8.5* 8.2*  HCT 21.0* 26.7* 25.4*  MCV 98.1 95.0 94.1  PLT 229 205 188      Lab Results  Component Value Date   HEPBSAG NON REACTIVE 05/01/2019   HEPBSAB Reactive (A) 05/05/2019      Microbiology:  Recent Results (from the past 240  hour(s))  SARS Coronavirus 2 by RT PCR (hospital order, performed in [place] hospital lab) Nasopharyngeal Nasopharyngeal Swab     Status: None   Collection Time: 07/22/19 10:17 PM   Specimen: Nasopharyngeal Swab  Result Value Ref Range Status   SARS Coronavirus 2 NEGATIVE NEGATIVE Final    Comment: (NOTE) SARS-CoV-2 target nucleic acids are NOT DETECTED. The SARS-CoV-2 RNA is generally detectable in upper and lower respiratory specimens during the acute phase of infection. The lowest concentration of SARS-CoV-2 viral copies this assay can detect is 250 copies / mL. A negative result does not preclude SARS-CoV-2 infection and should not be used as the sole basis for treatment or other patient management decisions.  A negative result may occur with improper specimen collection / handling, submission of specimen other than nasopharyngeal swab, presence of viral mutation(s) within the areas targeted by this assay, and inadequate number of viral copies (<250 copies / mL). A negative result must be combined with clinical observations, patient history, and epidemiological information. Fact Sheet for Patients:   StrictlyIdeas.no Fact Sheet for Healthcare Providers: BankingDealers.co.za This test is not yet approved or cleared  by the [place] FDA and has been authorized for detection and/or diagnosis of SARS-CoV-2 by FDA under an Emergency Use  Authorization (EUA).  This EUA will remain in effect (meaning this test can be used) for the duration of the COVID-19 declaration under Section 564(b)(1) of the Act, 21 U.S.C. section 360bbb-3(b)(1), unless the authorization is terminated or revoked sooner. Performed at [facility], [address] [place], [place] [address]   MRSA PCR Screening     Status: None   Collection Time: 07/24/19  4:28 AM   Specimen: Nasopharyngeal  Result Value Ref Range Status   MRSA by PCR NEGATIVE  NEGATIVE Final    Comment:        The GeneXpert MRSA Assay (FDA approved for NASAL specimens only), is one component of a comprehensive MRSA colonization surveillance program. It is not intended to diagnose MRSA infection nor to guide or monitor treatment for MRSA infections. Performed at [facility], [address] [place], [place] [address]     Coagulation Studies: No results for input(s): LABPROT, INR in the last 72 hours.  Urinalysis: No results for input(s): COLORURINE, LABSPEC, PHURINE, GLUCOSEU, HGBUR, BILIRUBINUR, KETONESUR, PROTEINUR, UROBILINOGEN, NITRITE, LEUKOCYTESUR in the last 72 hours.  Invalid input(s): APPERANCEUR    Imaging: No results found.   Medications:    . sodium chloride   Intravenous Once  . atorvastatin  80 mg Oral q1800  . calcium acetate  1,334 mg Oral TID WC  . Chlorhexidine Gluconate Cloth  6 each Topical Q0600  . insulin aspart  0-9 Units Subcutaneous Q4H  . levETIRAcetam  500 mg Oral BID  . levothyroxine  175 mcg Oral QAC breakfast  . metoprolol succinate  50 mg Oral Daily  . pantoprazole (PROTONIX) IV  40 mg Intravenous q1800   acetaminophen **OR** acetaminophen, heparin  Assessment/ Plan:  69 y.o. 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, insulin-dependent Hypertension Hyperlipidemia Hypothyroidism Seizure disorder Hospitalization for GI bleed in February and March 2021 Capsule endoscopy, push enteroscopy with nonbleeding AVMs     Admitted on 07/22/2019 for Acute blood loss anemia [D62] Symptomatic anemia [D64.9] Gastrointestinal hemorrhage, unspecified gastrointestinal hemorrhage type [K92.2]  [facility]// MWF-2//TW 84kg  #Anemia of blood loss and chronic kidney disease We will continue Epogen with hemodialysis Lab Results  Component Value Date   HGB 8.2 (L)  07/24/2019  GI evaluation is in progress by Dr. Vicente Males.  Patient was on Plavix.  Push enteroscopy planned after Plavix is stopped for 3 days.  Also consideration of capsule study.  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 Discussed increasing frequency of dialysis with patient.  Estrogen supplements can be considered as last resort.  #End-stage renal disease We will schedule routine dialysis on Friday (MWF schedule)  #Secondary hyperparathyroidism Lab Results  Component Value Date   PTH 91 (H) 03/13/2019   CALCIUM 7.6 (L) 07/23/2019   CAION 0.93 (L) 04/05/2019   PHOS 4.1 05/06/2019   Monitor calcium and phosphorus during this admission Continue calcium acetate with meals.  Hold if n.p.o.    LOS: 0 Macedonio Scallon Candiss Norse 5/28/20219:54 AM  [place], [place]  Note: This note was prepared with Dragon dictation. Any transcription errors are unintentional

## 2019-07-25 DIAGNOSIS — N186 End stage renal disease: Secondary | ICD-10-CM | POA: Diagnosis not present

## 2019-07-25 DIAGNOSIS — E119 Type 2 diabetes mellitus without complications: Secondary | ICD-10-CM

## 2019-07-25 DIAGNOSIS — E1122 Type 2 diabetes mellitus with diabetic chronic kidney disease: Secondary | ICD-10-CM | POA: Diagnosis not present

## 2019-07-25 DIAGNOSIS — I69351 Hemiplegia and hemiparesis following cerebral infarction affecting right dominant side: Secondary | ICD-10-CM | POA: Diagnosis not present

## 2019-07-25 DIAGNOSIS — E1142 Type 2 diabetes mellitus with diabetic polyneuropathy: Secondary | ICD-10-CM | POA: Diagnosis not present

## 2019-07-25 DIAGNOSIS — I69398 Other sequelae of cerebral infarction: Secondary | ICD-10-CM | POA: Diagnosis not present

## 2019-07-25 DIAGNOSIS — K5730 Diverticulosis of large intestine without perforation or abscess without bleeding: Secondary | ICD-10-CM | POA: Diagnosis not present

## 2019-07-25 DIAGNOSIS — F419 Anxiety disorder, unspecified: Secondary | ICD-10-CM | POA: Diagnosis not present

## 2019-07-25 DIAGNOSIS — K5520 Angiodysplasia of colon without hemorrhage: Secondary | ICD-10-CM | POA: Diagnosis not present

## 2019-07-25 DIAGNOSIS — Z20822 Contact with and (suspected) exposure to covid-19: Secondary | ICD-10-CM | POA: Diagnosis not present

## 2019-07-25 DIAGNOSIS — E1151 Type 2 diabetes mellitus with diabetic peripheral angiopathy without gangrene: Secondary | ICD-10-CM | POA: Diagnosis not present

## 2019-07-25 DIAGNOSIS — D649 Anemia, unspecified: Secondary | ICD-10-CM | POA: Diagnosis present

## 2019-07-25 DIAGNOSIS — K317 Polyp of stomach and duodenum: Secondary | ICD-10-CM | POA: Diagnosis not present

## 2019-07-25 DIAGNOSIS — K219 Gastro-esophageal reflux disease without esophagitis: Secondary | ICD-10-CM | POA: Diagnosis not present

## 2019-07-25 DIAGNOSIS — N2581 Secondary hyperparathyroidism of renal origin: Secondary | ICD-10-CM | POA: Diagnosis not present

## 2019-07-25 DIAGNOSIS — Z794 Long term (current) use of insulin: Secondary | ICD-10-CM

## 2019-07-25 DIAGNOSIS — H4902 Third [oculomotor] nerve palsy, left eye: Secondary | ICD-10-CM | POA: Diagnosis not present

## 2019-07-25 DIAGNOSIS — G40909 Epilepsy, unspecified, not intractable, without status epilepticus: Secondary | ICD-10-CM | POA: Diagnosis not present

## 2019-07-25 DIAGNOSIS — K222 Esophageal obstruction: Secondary | ICD-10-CM | POA: Diagnosis not present

## 2019-07-25 DIAGNOSIS — Z8673 Personal history of transient ischemic attack (TIA), and cerebral infarction without residual deficits: Secondary | ICD-10-CM | POA: Diagnosis not present

## 2019-07-25 DIAGNOSIS — E785 Hyperlipidemia, unspecified: Secondary | ICD-10-CM | POA: Diagnosis not present

## 2019-07-25 DIAGNOSIS — K31811 Angiodysplasia of stomach and duodenum with bleeding: Secondary | ICD-10-CM | POA: Diagnosis not present

## 2019-07-25 DIAGNOSIS — D631 Anemia in chronic kidney disease: Secondary | ICD-10-CM | POA: Diagnosis not present

## 2019-07-25 DIAGNOSIS — F17210 Nicotine dependence, cigarettes, uncomplicated: Secondary | ICD-10-CM | POA: Diagnosis not present

## 2019-07-25 DIAGNOSIS — K921 Melena: Secondary | ICD-10-CM | POA: Diagnosis not present

## 2019-07-25 DIAGNOSIS — J449 Chronic obstructive pulmonary disease, unspecified: Secondary | ICD-10-CM | POA: Diagnosis not present

## 2019-07-25 DIAGNOSIS — N400 Enlarged prostate without lower urinary tract symptoms: Secondary | ICD-10-CM | POA: Diagnosis not present

## 2019-07-25 DIAGNOSIS — I120 Hypertensive chronic kidney disease with stage 5 chronic kidney disease or end stage renal disease: Secondary | ICD-10-CM | POA: Diagnosis not present

## 2019-07-25 DIAGNOSIS — D62 Acute posthemorrhagic anemia: Secondary | ICD-10-CM | POA: Diagnosis not present

## 2019-07-25 LAB — CBC WITH DIFFERENTIAL/PLATELET
Abs Immature Granulocytes: 0.02 10*3/uL (ref 0.00–0.07)
Basophils Absolute: 0.1 10*3/uL (ref 0.0–0.1)
Basophils Relative: 1 %
Eosinophils Absolute: 0.2 10*3/uL (ref 0.0–0.5)
Eosinophils Relative: 3 %
HCT: 24.1 % — ABNORMAL LOW (ref 39.0–52.0)
Hemoglobin: 7.6 g/dL — ABNORMAL LOW (ref 13.0–17.0)
Immature Granulocytes: 0 %
Lymphocytes Relative: 19 %
Lymphs Abs: 1.1 10*3/uL (ref 0.7–4.0)
MCH: 30.2 pg (ref 26.0–34.0)
MCHC: 31.5 g/dL (ref 30.0–36.0)
MCV: 95.6 fL (ref 80.0–100.0)
Monocytes Absolute: 0.5 10*3/uL (ref 0.1–1.0)
Monocytes Relative: 8 %
Neutro Abs: 4.2 10*3/uL (ref 1.7–7.7)
Neutrophils Relative %: 69 %
Platelets: 173 10*3/uL (ref 150–400)
RBC: 2.52 MIL/uL — ABNORMAL LOW (ref 4.22–5.81)
RDW: 15.1 % (ref 11.5–15.5)
WBC: 6.1 10*3/uL (ref 4.0–10.5)
nRBC: 0 % (ref 0.0–0.2)

## 2019-07-25 LAB — GLUCOSE, CAPILLARY
Glucose-Capillary: 132 mg/dL — ABNORMAL HIGH (ref 70–99)
Glucose-Capillary: 141 mg/dL — ABNORMAL HIGH (ref 70–99)
Glucose-Capillary: 166 mg/dL — ABNORMAL HIGH (ref 70–99)
Glucose-Capillary: 163 mg/dL — ABNORMAL HIGH (ref 70–99)
Glucose-Capillary: 159 mg/dL — ABNORMAL HIGH (ref 70–99)
Glucose-Capillary: 134 mg/dL — ABNORMAL HIGH (ref 70–99)

## 2019-07-25 NOTE — Plan of Care (Signed)
Pt will remain free from falls and injury throughout shift and VS remain stable.

## 2019-07-25 NOTE — Progress Notes (Signed)
[place], [place] 07/25/19  Subjective:   Hospital day # 0 Patient underwent hemodialysis yesterday. Hemoglobin currently 7.6. Still having dark-colored stools.   Objective:  Vital signs in last 24 hours:  Temp:  [97.8 F (36.6 C)-99.2 F (37.3 C)] 97.8 F (36.6 C) (05/29 1522) Pulse Rate:  [70-77] 71 (05/29 1522) Resp:  [14-20] 17 (05/29 1522) BP: (129-161)/(64-96) 153/74 (05/29 1522) SpO2:  [95 %-98 %] 97 % (05/29 1522)  Weight change: 3.583 kg Filed Weights   07/23/19 1721 07/24/19 0422 07/24/19 1343  Weight: 83.8 kg 86.6 kg 87.4 kg    Intake/Output:    Intake/Output Summary (Last 24 hours) at 07/25/2019 1601 Last data filed at 07/25/2019 1016 Gross per 24 hour  Intake 480 ml  Output 0 ml  Net 480 ml    Physical Exam: General:  No acute distress, laying in the bed  HEENT  anicteric, moist oral mucous membrane  Pulm/lungs  normal breathing effort, lungs are clear to auscultation  CVS/Heart  regular rhythm, no rub or gallop  Abdomen:   Soft, nontender  Extremities:  No peripheral edema  Neurologic:  Alert, oriented, able to follow commands  Skin:  No acute rashes  Left arm AV fistula   Basic Metabolic Panel:  Recent Labs  Lab 07/22/19 1554 07/23/19 1212  NA 136 138  K 3.3* 4.4  CL 96* 99  CO2 28 28  GLUCOSE 152* 134*  BUN 11 18  CREATININE 3.38* 5.18*  CALCIUM 7.8* 7.6*     CBC: Recent Labs  Lab 07/22/19 1554 07/23/19 1212 07/24/19 0600 07/25/19 0426  WBC 5.1 6.0 7.3 6.1  NEUTROABS  --  4.3 5.3 4.2  HGB 6.7* 8.5* 8.2* 7.6*  HCT 21.0* 26.7* 25.4* 24.1*  MCV 98.1 95.0 94.1 95.6  PLT 229 205 188 173      Lab Results  Component Value Date   HEPBSAG NON REACTIVE 05/01/2019   HEPBSAB Reactive (A) 05/05/2019      Microbiology:  Recent Results (from the past 240 hour(s))  SARS Coronavirus 2 by RT PCR (hospital order, performed in [place] hospital lab) Nasopharyngeal Nasopharyngeal Swab     Status:  None   Collection Time: 07/22/19 10:17 PM   Specimen: Nasopharyngeal Swab  Result Value Ref Range Status   SARS Coronavirus 2 NEGATIVE NEGATIVE Final    Comment: (NOTE) SARS-CoV-2 target nucleic acids are NOT DETECTED. The SARS-CoV-2 RNA is generally detectable in upper and lower respiratory specimens during the acute phase of infection. The lowest concentration of SARS-CoV-2 viral copies this assay can detect is 250 copies / mL. A negative result does not preclude SARS-CoV-2 infection and should not be used as the sole basis for treatment or other patient management decisions.  A negative result may occur with improper specimen collection / handling, submission of specimen other than nasopharyngeal swab, presence of viral mutation(s) within the areas targeted by this assay, and inadequate number of viral copies (<250 copies / mL). A negative result must be combined with clinical observations, patient history, and epidemiological information. Fact Sheet for Patients:   StrictlyIdeas.no Fact Sheet for Healthcare Providers: BankingDealers.co.za This test is not yet approved or cleared  by the [place] FDA and has been authorized for detection and/or diagnosis of SARS-CoV-2 by FDA under an Emergency Use Authorization (EUA).  This EUA will remain in effect (meaning this test can be used) for the duration of the COVID-19 declaration under Section 564(b)(1) of the Act, 21 U.S.C. section  360bbb-3(b)(1), unless the authorization is terminated or revoked sooner. Performed at [facility], [address] [place], [place] [address]   MRSA PCR Screening     Status: None   Collection Time: 07/24/19  4:28 AM   Specimen: Nasopharyngeal  Result Value Ref Range Status   MRSA by PCR NEGATIVE NEGATIVE Final    Comment:        The GeneXpert MRSA Assay (FDA approved for NASAL specimens only), is one component of a comprehensive MRSA  colonization surveillance program. It is not intended to diagnose MRSA infection nor to guide or monitor treatment for MRSA infections. Performed at [facility], [address] [place], [place] [address]     Coagulation Studies: No results for input(s): LABPROT, INR in the last 72 hours.  Urinalysis: No results for input(s): COLORURINE, LABSPEC, PHURINE, GLUCOSEU, HGBUR, BILIRUBINUR, KETONESUR, PROTEINUR, UROBILINOGEN, NITRITE, LEUKOCYTESUR in the last 72 hours.  Invalid input(s): APPERANCEUR    Imaging: No results found.   Medications:    . sodium chloride   Intravenous Once  . atorvastatin  80 mg Oral q1800  . calcium acetate  1,334 mg Oral TID WC  . Chlorhexidine Gluconate Cloth  6 each Topical Q0600  . insulin aspart  0-9 Units Subcutaneous Q4H  . levETIRAcetam  500 mg Oral BID  . levothyroxine  175 mcg Oral QAC breakfast  . metoprolol succinate  50 mg Oral Daily  . pantoprazole (PROTONIX) IV  40 mg Intravenous q1800  . sodium chloride flush  10 mL Intravenous Q12H   acetaminophen **OR** acetaminophen, doxylamine (Sleep)  Assessment/ Plan:  69 y.o. 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, insulin-dependent Hypertension Hyperlipidemia Hypothyroidism Seizure disorder Hospitalization for GI bleed in February and March 2021 Capsule endoscopy, push enteroscopy with nonbleeding AVMs     Admitted on 07/22/2019 for Acute blood loss anemia [D62] Symptomatic anemia [D64.9] Gastrointestinal hemorrhage, unspecified gastrointestinal hemorrhage type [K92.2]  [facility]//MWF-2//TW 84kg  #Anemia of blood loss and chronic kidney disease We will continue Epogen with hemodialysis Lab Results  Component Value Date   HGB 7.6 (L) 07/25/2019  GI evaluation is in progress by Dr. Vicente Males.  Patient was on Plavix.  Push  enteroscopy planned after Plavix is stopped for 3 days.  Also consideration of capsule study.  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  #End-stage renal disease Patient underwent dialysis yesterday.  No acute indication for dialysis today.  Next scheduled dialysis treatment on Monday.  #Secondary hyperparathyroidism Lab Results  Component Value Date   PTH 91 (H) 03/13/2019   CALCIUM 7.6 (L) 07/23/2019   CAION 0.93 (L) 04/05/2019   PHOS 4.1 05/06/2019   Phosphorus at target at 4.1.  Maintain the patient on calcium acetate 2 tablets p.o. 3 times daily with meals.    LOS: 0 Anhelica Fowers 5/29/20214:01 PM  [place], [place]  Note: This note was prepared with Dragon dictation. Any transcription errors are unintentional

## 2019-07-25 NOTE — Progress Notes (Signed)
PT Cancellation Note  Patient Details Name: Kyle [name] MRN: [id number] DOB: [birth year]   Cancelled Treatment:    Reason Eval/Treat Not Completed: Other (comment).  Pt reports he is fine, no need for PT to see him.  Follow up tomorrow to see if he is more willing to see PT and be assessed for mobility.   Ramond Dial 07/25/2019, 2:33 PM   Mee Hives, PT MS Acute Rehab Dept. Number: [place] and [place]

## 2019-07-25 NOTE — Plan of Care (Signed)
  Problem: Education: Goal: Knowledge of General Education information will improve Description: Including pain rating scale, medication(s)/side effects and non-pharmacologic comfort measures Outcome: Progressing   Problem: Health Behavior/Discharge Planning: Goal: Ability to manage health-related needs will improve Outcome: Progressing   Problem: Clinical Measurements: Goal: Ability to maintain clinical measurements within normal limits will improve Outcome: Not Progressing Note: Patient's hemoglobin is less than 8. HD on M/W/F. Will continue to monitor lab values for the remainder of the shift. Wenda Low [facility]

## 2019-07-25 NOTE — Progress Notes (Signed)
Progress Note    Tilman Mcclaren  TDH:[id number] DOB: 1951-02-24  DOA: 07/22/2019 PCP: Idelle Crouch, MD      Brief Narrative:    Medical records reviewed and are as summarized below:  Platon Arocho is a 69 y.o. 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  He has had extensive work-up including upper and lower endoscopy, capsule endoscopy and push enteroscopy.  Work-up had revealed nonbleeding AVM that was treated with APC.  He returned to the emergency room on 07/22/2019 withintermittent melena stool that has been going on for several weeks.  He was referred from the outpatient dialysis center because hemoglobin was 5.7.      Assessment/Plan:   Principal Problem:   Melena Active Problems:   Insulin dependent type 2 diabetes mellitus (HCC)   COPD with chronic bronchitis (HCC)   ESRD (end stage renal disease) (HCC)   PAD (peripheral artery disease) (HCC)   Diabetic polyneuropathy associated with type 2 diabetes mellitus ([place])   History of CVA (cerebrovascular accident)   Acute blood loss anemia   ESRD (end stage renal disease) on dialysis ([place])   History of GI bleed   Melena Acute on chronic blood loss anemia   History of recurrent GI bleed Initial hemoglobin in the ED was 6.7. s/p transfusion with 2 units of PRBCs on 07/22/2019.  Hemoglobin is dropping again.  No indication for blood transfusion at this time.  Monitor H&H.   Continue IV Protonix.  Plavix has been held with plan for push enteroscopy when he has been off of Plavix for at least 3 days.  -Patient had 2 recent admission for GI bleeding with EGD, colonoscopy, capsule endoscopy and push  enteroscopy significant for single nonbleeding colonic angiodysplastic lesion status post monopolar probe treatment.     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  Essential hypotension Continue antihypertensives  Diabetes mellitus type 2 with hyperlipidemia -Sliding scale insulin coverage Hemoglobin A1c 6.4  Hypothyroidism -Continue Synthroid  Seizure disorder Continue Keppra  Body mass index is 26.13 kg/m.   Family Communication/Anticipated D/C date and plan/Code Status   DVT prophylaxis: SCD Code Status: Full code Family Communication: Plan discussed with patient Disposition Plan:    Status is: Inpatient  Remains inpatient appropriate because:Unsafe d/c plan   Dispo: The patient is from: Home              Anticipated d/c is to: Home              Anticipated d/c date is: 3 days              Patient currently is not medically stable to d/c.                 Subjective:   He does not feel back to baseline.  No shortness of breath or chest pain.  Objective:    Vitals:   07/25/19 0500 07/25/19 0726 07/25/19 1220 07/25/19 1522  BP:  (!) 161/66 (!) 129/96 (!) 153/74  Pulse:  72 74 71  Resp: 19 14 16 17   Temp:  98.2 F (36.8 C) 99.2 F (37.3 C) 97.8 F (36.6 C)  TempSrc:    Oral  SpO2:  98% 96% 97%  Weight:      Height:       No data found.   Intake/Output Summary (Last 24 hours) at 07/25/2019 1533 Last data filed at 07/25/2019 1016 Gross per 24 hour  Intake 480 ml  Output 0 ml  Net 480 ml   Filed Weights   07/23/19 1721 07/24/19 0422 07/24/19 1343  Weight: 83.8 kg 86.6 kg 87.4 kg    Exam:  GEN: NAD SKIN: No rash EYES: Pale but anicteric,PERRLA ENT: MMM CV: RRR PULM: CTA B ABD: soft, ND, NT, +BS CNS: AAO x 3 EXT: No edema or tenderness   Data Reviewed:   I have personally  reviewed following labs and imaging studies:  Labs: Labs show the following:   Basic Metabolic Panel: Recent Labs  Lab 07/22/19 1554 07/23/19 1212  NA 136 138  K 3.3* 4.4  CL 96* 99  CO2 28 28  GLUCOSE 152* 134*  BUN 11 18  CREATININE 3.38* 5.18*  CALCIUM 7.8* 7.6*   GFR Estimated Creatinine Clearance: 14.4 mL/min (A) (by C-G formula based on SCr of 5.18 mg/dL (H)). Liver Function Tests: Recent Labs  Lab 07/22/19 1554  AST 19  ALT 12  ALKPHOS 115  BILITOT 0.6  PROT 6.5  ALBUMIN 3.3*   No results for input(s): LIPASE, AMYLASE in the last 168 hours. No results for input(s): AMMONIA in the last 168 hours. Coagulation profile No results for input(s): INR, PROTIME in the last 168 hours.  CBC: Recent Labs  Lab 07/22/19 1554 07/23/19 1212 07/24/19 0600 07/25/19 0426  WBC 5.1 6.0 7.3 6.1  NEUTROABS  --  4.3 5.3 4.2  HGB 6.7* 8.5* 8.2* 7.6*  HCT 21.0* 26.7* 25.4* 24.1*  MCV 98.1 95.0 94.1 95.6  PLT 229 205 188 173   Cardiac Enzymes: No results for input(s): CKTOTAL, CKMB, CKMBINDEX, TROPONINI in the last 168 hours. BNP (last 3 results) No results for input(s): PROBNP in the last 8760 hours. CBG: Recent Labs  Lab 07/24/19 1959 07/24/19 2316 07/25/19 0357 07/25/19 0727 07/25/19 1217  GLUCAP 277* 141* 132* 141* 166*   D-Dimer: No results for input(s): DDIMER in the last 72 hours. Hgb A1c: Recent Labs    07/22/19 1554  HGBA1C 6.4*   Lipid Profile: No results for input(s): CHOL, HDL, LDLCALC, TRIG, CHOLHDL, LDLDIRECT in the last 72 hours. Thyroid function studies: No results for input(s): TSH, T4TOTAL, T3FREE, THYROIDAB in the last 72 hours.  Invalid input(s): FREET3 Anemia work up: No results for input(s): VITAMINB12, FOLATE, FERRITIN, TIBC, IRON, RETICCTPCT in the last 72 hours. Sepsis Labs: Recent Labs  Lab 07/22/19 1554 07/23/19 1212 07/24/19 0600 07/25/19 0426  WBC 5.1 6.0 7.3 6.1    Microbiology Recent Results (from the past 240  hour(s))  SARS Coronavirus 2 by RT PCR (hospital order, performed in [facility] hospital lab) Nasopharyngeal Nasopharyngeal Swab     Status: None   Collection Time: 07/22/19 10:17 PM   Specimen: Nasopharyngeal Swab  Result Value Ref Range Status   SARS Coronavirus 2 NEGATIVE NEGATIVE Final    Comment: (NOTE) SARS-CoV-2 target nucleic acids are NOT DETECTED. The SARS-CoV-2 RNA is generally detectable in upper and lower respiratory specimens during the acute phase of infection. The lowest concentration of SARS-CoV-2 viral copies this assay can detect is 250 copies / mL. A negative result does not preclude SARS-CoV-2 infection and should not be used as the sole basis for treatment or other  patient management decisions.  A negative result may occur with improper specimen collection / handling, submission of specimen other than nasopharyngeal swab, presence of viral mutation(s) within the areas targeted by this assay, and inadequate number of viral copies (<250 copies / mL). A negative result must be combined with clinical observations, patient history, and epidemiological information. Fact Sheet for Patients:   StrictlyIdeas.no Fact Sheet for Healthcare Providers: BankingDealers.co.za This test is not yet approved or cleared  by the [place] FDA and has been authorized for detection and/or diagnosis of SARS-CoV-2 by FDA under an Emergency Use Authorization (EUA).  This EUA will remain in effect (meaning this test can be used) for the duration of the COVID-19 declaration under Section 564(b)(1) of the Act, 21 U.S.C. section 360bbb-3(b)(1), unless the authorization is terminated or revoked sooner. Performed at [facility], [address] [place], [place] [address]   MRSA PCR Screening     Status: None   Collection Time: 07/24/19  4:28 AM   Specimen: Nasopharyngeal  Result Value Ref Range Status   MRSA by PCR NEGATIVE  NEGATIVE Final    Comment:        The GeneXpert MRSA Assay (FDA approved for NASAL specimens only), is one component of a comprehensive MRSA colonization surveillance program. It is not intended to diagnose MRSA infection nor to guide or monitor treatment for MRSA infections. Performed at [facility], [address] [place], [place] [address]     Procedures and diagnostic studies:  No results found.  Medications:   . sodium chloride   Intravenous Once  . atorvastatin  80 mg Oral q1800  . calcium acetate  1,334 mg Oral TID WC  . Chlorhexidine Gluconate Cloth  6 each Topical Q0600  . insulin aspart  0-9 Units Subcutaneous Q4H  . levETIRAcetam  500 mg Oral BID  . levothyroxine  175 mcg Oral QAC breakfast  . metoprolol succinate  50 mg Oral Daily  . pantoprazole (PROTONIX) IV  40 mg Intravenous q1800  . sodium chloride flush  10 mL Intravenous Q12H   Continuous Infusions:   LOS: 0 days   Madysin Crisp  [facility]     07/25/2019, 3:33 PM

## 2019-07-26 DIAGNOSIS — K31811 Angiodysplasia of stomach and duodenum with bleeding: Secondary | ICD-10-CM | POA: Diagnosis not present

## 2019-07-26 DIAGNOSIS — D62 Acute posthemorrhagic anemia: Secondary | ICD-10-CM | POA: Diagnosis not present

## 2019-07-26 DIAGNOSIS — K5520 Angiodysplasia of colon without hemorrhage: Secondary | ICD-10-CM

## 2019-07-26 DIAGNOSIS — N186 End stage renal disease: Secondary | ICD-10-CM | POA: Diagnosis not present

## 2019-07-26 DIAGNOSIS — K921 Melena: Secondary | ICD-10-CM | POA: Diagnosis not present

## 2019-07-26 DIAGNOSIS — D649 Anemia, unspecified: Secondary | ICD-10-CM

## 2019-07-26 LAB — CBC WITH DIFFERENTIAL/PLATELET
Abs Immature Granulocytes: 0.02 10*3/uL (ref 0.00–0.07)
Basophils Absolute: 0.1 10*3/uL (ref 0.0–0.1)
Basophils Relative: 2 %
Eosinophils Absolute: 0.2 10*3/uL (ref 0.0–0.5)
Eosinophils Relative: 3 %
HCT: 24.5 % — ABNORMAL LOW (ref 39.0–52.0)
Hemoglobin: 7.8 g/dL — ABNORMAL LOW (ref 13.0–17.0)
Immature Granulocytes: 0 %
Lymphocytes Relative: 17 %
Lymphs Abs: 1.1 10*3/uL (ref 0.7–4.0)
MCH: 29.8 pg (ref 26.0–34.0)
MCHC: 31.8 g/dL (ref 30.0–36.0)
MCV: 93.5 fL (ref 80.0–100.0)
Monocytes Absolute: 0.5 10*3/uL (ref 0.1–1.0)
Monocytes Relative: 8 %
Neutro Abs: 4.6 10*3/uL (ref 1.7–7.7)
Neutrophils Relative %: 70 %
Platelets: 189 10*3/uL (ref 150–400)
RBC: 2.62 MIL/uL — ABNORMAL LOW (ref 4.22–5.81)
RDW: 15.1 % (ref 11.5–15.5)
WBC: 6.6 10*3/uL (ref 4.0–10.5)
nRBC: 0 % (ref 0.0–0.2)

## 2019-07-26 LAB — GLUCOSE, CAPILLARY
Glucose-Capillary: 143 mg/dL — ABNORMAL HIGH (ref 70–99)
Glucose-Capillary: 139 mg/dL — ABNORMAL HIGH (ref 70–99)
Glucose-Capillary: 173 mg/dL — ABNORMAL HIGH (ref 70–99)
Glucose-Capillary: 209 mg/dL — ABNORMAL HIGH (ref 70–99)
Glucose-Capillary: 180 mg/dL — ABNORMAL HIGH (ref 70–99)

## 2019-07-26 LAB — PREPARE RBC (CROSSMATCH)

## 2019-07-26 MED ORDER — SODIUM CHLORIDE 0.9% IV SOLUTION: Freq: Once | INTRAVENOUS | Status: AC

## 2019-07-26 NOTE — Progress Notes (Signed)
Kyle Darby, MD [address]  [place]  [place], [place] [address]  Main: [phone]  Fax: [phone] Pager: [phone]   Subjective: Reports having melena, hemoglobin stable in last 24 hours, 7.8 today He does not have any other concerns other than feeling lightheaded which is at his baseline from his history of stroke  Objective: Vital signs in last 24 hours: Vitals:   07/26/19 0400 07/26/19 0405 07/26/19 0810 07/26/19 1113  BP: (!) 164/59  (!) 160/61 (!) 164/68  Pulse: 71  68 69  Resp: 20  15 17   Temp: 98 F (36.7 C)  98.6 F (37 C) 98 F (36.7 C)  TempSrc: Oral   Oral  SpO2: 91%  96% 95%  Weight:  88.1 kg    Height:       Weight change: 0.68 kg No intake or output data in the 24 hours ending 07/26/19 1159   Exam: Heart:: Regular rate and rhythm, S1S2 present or without murmur or extra heart sounds Lungs: normal and clear to auscultation Abdomen: soft, nontender, normal bowel sounds   Lab Results: CBC Latest Ref Rng & Units 07/26/2019 07/25/2019 07/24/2019  WBC 4.0 - 10.5 K/uL 6.6 6.1 7.3  Hemoglobin 13.0 - 17.0 g/dL 7.8(L) 7.6(L) 8.2(L)  Hematocrit 39.0 - 52.0 % 24.5(L) 24.1(L) 25.4(L)  Platelets 150 - 400 K/uL 189 173 188   CMP Latest Ref Rng & Units 07/23/2019 07/22/2019 06/26/2019  Glucose 70 - 99 mg/dL 134(H) 152(H) 273(H)  BUN 8 - 23 mg/dL 18 11 29(H)  Creatinine 0.61 - 1.24 mg/dL 5.18(H) 3.38(H) 5.58(H)  Sodium 135 - 145 mmol/L 138 136 136  Potassium 3.5 - 5.1 mmol/L 4.4 3.3(L) 4.4  Chloride 98 - 111 mmol/L 99 96(L) 98  CO2 22 - 32 mmol/L 28 28 25   Calcium 8.9 - 10.3 mg/dL 7.6(L) 7.8(L) 7.5(L)  Total Protein 6.5 - 8.1 g/dL - 6.5 -  Total Bilirubin 0.3 - 1.2 mg/dL - 0.6 -  Alkaline Phos 38 - 126 U/L - 115 -  AST 15 - 41 U/L - 19 -  ALT 0 - 44 U/L - 12 -    Micro Results: Recent Results (from the past 240 hour(s))  SARS Coronavirus 2 by RT PCR (hospital order, performed in [facility] hospital lab) Nasopharyngeal Nasopharyngeal  Swab     Status: None   Collection Time: 07/22/19 10:17 PM   Specimen: Nasopharyngeal Swab  Result Value Ref Range Status   SARS Coronavirus 2 NEGATIVE NEGATIVE Final    Comment: (NOTE) SARS-CoV-2 target nucleic acids are NOT DETECTED. The SARS-CoV-2 RNA is generally detectable in upper and lower respiratory specimens during the acute phase of infection. The lowest concentration of SARS-CoV-2 viral copies this assay can detect is 250 copies / mL. A negative result does not preclude SARS-CoV-2 infection and should not be used as the sole basis for treatment or other patient management decisions.  A negative result may occur with improper specimen collection / handling, submission of specimen other than nasopharyngeal swab, presence of viral mutation(s) within the areas targeted by this assay, and inadequate number of viral copies (<250 copies / mL). A negative result must be combined with clinical observations, patient history, and epidemiological information. Fact Sheet for Patients:   StrictlyIdeas.no Fact Sheet for Healthcare Providers: BankingDealers.co.za This test is not yet approved or cleared  by the [place] FDA and has been authorized for detection and/or diagnosis of SARS-CoV-2 by FDA under an Emergency Use Authorization (EUA).  This EUA will remain in effect (meaning this test can be used) for the duration of the COVID-19 declaration under Section 564(b)(1) of the Act, 21 U.S.C. section 360bbb-3(b)(1), unless the authorization is terminated or revoked sooner. Performed at [facility], [address] [place], [place] [address]   MRSA PCR Screening     Status: None   Collection Time: 07/24/19  4:28 AM   Specimen: Nasopharyngeal  Result Value Ref Range Status   MRSA by PCR NEGATIVE NEGATIVE Final    Comment:        The GeneXpert MRSA Assay (FDA approved for NASAL specimens only), is one component of a  comprehensive MRSA colonization surveillance program. It is not intended to diagnose MRSA infection nor to guide or monitor treatment for MRSA infections. Performed at [facility], [address] [place], [place] [address]    Studies/Results: No results found. Medications:  I have reviewed the patient's current medications. Prior to Admission:  Medications Prior to Admission  Medication Sig Dispense Refill Last Dose  . atorvastatin (LIPITOR) 80 MG tablet Take 1 tablet (80 mg total) by mouth at bedtime.   Unknown at Unknown  . B Complex-C-Folic Acid (NEPHRO-VITE PO) Take 1 tablet by mouth daily.   PRN at PRN  . calcium acetate (PHOSLO) 667 MG capsule Take 2 capsules (1,334 mg total) by mouth 3 (three) times daily with meals. (Patient taking differently: Take 1,334 mg by mouth in the morning and at bedtime. ) 180 capsule 0 Unknown at Unknown  . clopidogrel (PLAVIX) 75 MG tablet Take 75 mg by mouth daily.   Unknown at Unknown  . escitalopram (LEXAPRO) 10 MG tablet Take 10 mg by mouth daily.   Unknown at Unknown  . insulin glargine (LANTUS) 100 UNIT/ML injection Inject 15 Units into the skin at bedtime.    Unknown at Unknown  . levETIRAcetam (KEPPRA) 500 MG tablet Take 500 mg by mouth 2 (two) times a day.   Unknown at Unknown  . levothyroxine (SYNTHROID) 200 MCG tablet Take 200 mcg by mouth daily before breakfast.    Unknown at Unknown  . lidocaine-prilocaine (EMLA) cream Apply 1 application topically Every Tuesday,Thursday,and Saturday with dialysis.    PRN at PRN  . losartan (COZAAR) 100 MG tablet Take 100 mg by mouth daily.   Unknown at Unknown  . Pancrelipase, Lip-Prot-Amyl, 24000-76000 units CPEP Take 1 capsule by mouth 3 (three) times daily with meals.    Unknown at Unknown  . pantoprazole (PROTONIX) 40 MG tablet Take 1 tablet (40 mg total) by mouth 2 (two) times daily. 60 tablet 0 Unknown at Unknown  . traZODone (DESYREL) 100 MG tablet Take 100 mg by mouth at bedtime.    Unknown at Unknown  . gentamicin cream (GARAMYCIN) 0.1 % Apply 1 application topically daily. (Patient not taking: Reported on 06/23/2019) 15 g 0 Not Taking at Unknown time  . HYDROcodone-acetaminophen (NORCO) 5-325 MG tablet Take 1-2 tablets by mouth every 6 (six) hours as needed for moderate pain or severe pain. (Patient not taking: Reported on 07/22/2019) 20 tablet 0 Completed Course at Unknown time  . hydrOXYzine (ATARAX/VISTARIL) 25 MG tablet Take 25 mg by mouth every 6 (six) hours as needed for itching.   Completed Course at Unknown time  . insulin lispro (HUMALOG) 100 UNIT/ML KwikPen Inject 0-0.12 mLs (0-12 Units total) into the skin See admin instructions. Inject 0-12 units into the skin three times a day before meals, depending on BGL: <70 = CALL MD; 70-200 =  give nothing; 201-250 = 2 units; 251-300 = 4 units; 301-350 = 6 units; 351-400 = 8 units; 401-450 = 10 units; 451-600 = 12 units (Patient not taking: Reported on 07/22/2019)   Completed Course at Unknown time  . metoprolol succinate (TOPROL-XL) 50 MG 24 hr tablet Take 1 tablet (50 mg total) by mouth daily. Take with or immediately following a meal. (Patient not taking: Reported on 07/22/2019) 30 tablet 0 Not Taking at Unknown time  . polyethylene glycol powder (GLYCOLAX/MIRALAX) 17 GM/SCOOP powder Take 17 g by mouth daily as needed for moderate constipation.    Completed Course at Unknown time  . potassium chloride SA (KLOR-CON) 20 MEQ tablet Take 20 mEq by mouth 2 (two) times daily.   Completed Course at Unknown time   Scheduled: . sodium chloride   Intravenous Once  . atorvastatin  80 mg Oral q1800  . calcium acetate  1,334 mg Oral TID WC  . Chlorhexidine Gluconate Cloth  6 each Topical Q0600  . insulin aspart  0-9 Units Subcutaneous Q4H  . levETIRAcetam  500 mg Oral BID  . levothyroxine  175 mcg Oral QAC breakfast  . metoprolol succinate  50 mg Oral Daily  . pantoprazole (PROTONIX) IV  40 mg Intravenous q1800  . sodium chloride flush   10 mL Intravenous Q12H   Continuous:  [id number] **OR** acetaminophen, doxylamine (Sleep) Anti-infectives (From admission, onward)   None     Scheduled Meds: . sodium chloride   Intravenous Once  . atorvastatin  80 mg Oral q1800  . calcium acetate  1,334 mg Oral TID WC  . Chlorhexidine Gluconate Cloth  6 each Topical Q0600  . insulin aspart  0-9 Units Subcutaneous Q4H  . levETIRAcetam  500 mg Oral BID  . levothyroxine  175 mcg Oral QAC breakfast  . metoprolol succinate  50 mg Oral Daily  . pantoprazole (PROTONIX) IV  40 mg Intravenous q1800  . sodium chloride flush  10 mL Intravenous Q12H   Continuous Infusions: PRN Meds:.acetaminophen **OR** acetaminophen, doxylamine (Sleep)   Assessment: Principal Problem:   Melena Active Problems:   Insulin dependent type 2 diabetes mellitus (HCC)   COPD with chronic bronchitis (HCC)   ESRD (end stage renal disease) (HCC)   PAD (peripheral artery disease) (HCC)   Diabetic polyneuropathy associated with type 2 diabetes mellitus ([place])   History of CVA (cerebrovascular accident)   Acute blood loss anemia   ESRD (end stage renal disease) on dialysis ([place])   History of GI bleed  69 y.o. male with history of CVA, ESRD on dialysis on Plavix presents to the emergency room with melena, known history of AVMs  Plan: Recommend to transfuse 1 unit of PRBCs today Patient is off Plavix since 5/27, plan for enteroscopy tomorrow, if negative will perform video capsule endoscopy N.p.o. past midnight   LOS: 1 day   Rohini Vanga 07/26/2019, 11:59 AM

## 2019-07-26 NOTE — Progress Notes (Signed)
[place], [place] 07/26/19  Subjective:   Hospital day # 1 Patient resting comfortably in bed. Hemoglobin stable at 7.8. Still having melena.   Objective:  Vital signs in last 24 hours:  Temp:  [97.8 F (36.6 C)-99.2 F (37.3 C)] 98.6 F (37 C) (05/30 0810) Pulse Rate:  [68-74] 68 (05/30 0810) Resp:  [15-20] 15 (05/30 0810) BP: (129-164)/(52-96) 160/61 (05/30 0810) SpO2:  [91 %-97 %] 96 % (05/30 0810) Weight:  [88.1 kg] 88.1 kg (05/30 0405)  Weight change: 0.68 kg Filed Weights   07/24/19 0422 07/24/19 1343 07/26/19 0405  Weight: 86.6 kg 87.4 kg 88.1 kg    Intake/Output:    Intake/Output Summary (Last 24 hours) at 07/26/2019 0848 Last data filed at 07/25/2019 1016 Gross per 24 hour  Intake 240 ml  Output --  Net 240 ml    Physical Exam: General:  No acute distress, laying in the bed  HEENT  anicteric, moist oral mucous membrane  Pulm/lungs  normal breathing effort, lungs are clear to auscultation  CVS/Heart  regular rhythm, no rub or gallop  Abdomen:   Soft, nontender  Extremities:  No peripheral edema  Neurologic:  Alert, oriented, able to follow commands  Skin:  No acute rashes  Left arm AV fistula   Basic Metabolic Panel:  Recent Labs  Lab 07/22/19 1554 07/23/19 1212  NA 136 138  K 3.3* 4.4  CL 96* 99  CO2 28 28  GLUCOSE 152* 134*  BUN 11 18  CREATININE 3.38* 5.18*  CALCIUM 7.8* 7.6*     CBC: Recent Labs  Lab 07/22/19 1554 07/23/19 1212 07/24/19 0600 07/25/19 0426 07/26/19 0613  WBC 5.1 6.0 7.3 6.1 6.6  NEUTROABS  --  4.3 5.3 4.2 4.6  HGB 6.7* 8.5* 8.2* 7.6* 7.8*  HCT 21.0* 26.7* 25.4* 24.1* 24.5*  MCV 98.1 95.0 94.1 95.6 93.5  PLT 229 205 188 173 189      Lab Results  Component Value Date   HEPBSAG NON REACTIVE 05/01/2019   HEPBSAB Reactive (A) 05/05/2019      Microbiology:  Recent Results (from the past 240 hour(s))  SARS Coronavirus 2 by RT PCR (hospital order, performed in [place]  hospital lab) Nasopharyngeal Nasopharyngeal Swab     Status: None   Collection Time: 07/22/19 10:17 PM   Specimen: Nasopharyngeal Swab  Result Value Ref Range Status   SARS Coronavirus 2 NEGATIVE NEGATIVE Final    Comment: (NOTE) SARS-CoV-2 target nucleic acids are NOT DETECTED. The SARS-CoV-2 RNA is generally detectable in upper and lower respiratory specimens during the acute phase of infection. The lowest concentration of SARS-CoV-2 viral copies this assay can detect is 250 copies / mL. A negative result does not preclude SARS-CoV-2 infection and should not be used as the sole basis for treatment or other patient management decisions.  A negative result may occur with improper specimen collection / handling, submission of specimen other than nasopharyngeal swab, presence of viral mutation(s) within the areas targeted by this assay, and inadequate number of viral copies (<250 copies / mL). A negative result must be combined with clinical observations, patient history, and epidemiological information. Fact Sheet for Patients:   StrictlyIdeas.no Fact Sheet for Healthcare Providers: BankingDealers.co.za This test is not yet approved or cleared  by the [place] FDA and has been authorized for detection and/or diagnosis of SARS-CoV-2 by FDA under an Emergency Use Authorization (EUA).  This EUA will remain in effect (meaning this test can be used)  for the duration of the COVID-19 declaration under Section 564(b)(1) of the Act, 21 U.S.C. section 360bbb-3(b)(1), unless the authorization is terminated or revoked sooner. Performed at [facility], [address] [place], [place] [address]   MRSA PCR Screening     Status: None   Collection Time: 07/24/19  4:28 AM   Specimen: Nasopharyngeal  Result Value Ref Range Status   MRSA by PCR NEGATIVE NEGATIVE Final    Comment:        The GeneXpert MRSA Assay (FDA approved for NASAL  specimens only), is one component of a comprehensive MRSA colonization surveillance program. It is not intended to diagnose MRSA infection nor to guide or monitor treatment for MRSA infections. Performed at [facility], [address] [place], [place] [address]     Coagulation Studies: No results for input(s): LABPROT, INR in the last 72 hours.  Urinalysis: No results for input(s): COLORURINE, LABSPEC, PHURINE, GLUCOSEU, HGBUR, BILIRUBINUR, KETONESUR, PROTEINUR, UROBILINOGEN, NITRITE, LEUKOCYTESUR in the last 72 hours.  Invalid input(s): APPERANCEUR    Imaging: No results found.   Medications:    . sodium chloride   Intravenous Once  . atorvastatin  80 mg Oral q1800  . calcium acetate  1,334 mg Oral TID WC  . Chlorhexidine Gluconate Cloth  6 each Topical Q0600  . insulin aspart  0-9 Units Subcutaneous Q4H  . levETIRAcetam  500 mg Oral BID  . levothyroxine  175 mcg Oral QAC breakfast  . metoprolol succinate  50 mg Oral Daily  . pantoprazole (PROTONIX) IV  40 mg Intravenous q1800  . sodium chloride flush  10 mL Intravenous Q12H   acetaminophen **OR** acetaminophen, doxylamine (Sleep)  Assessment/ Plan:  69 y.o. 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, insulin-dependent Hypertension Hyperlipidemia Hypothyroidism Seizure disorder Hospitalization for GI bleed in February and March 2021 Capsule endoscopy, push enteroscopy with nonbleeding AVMs     Admitted on 07/22/2019 for Acute blood loss anemia [D62] Symptomatic anemia [D64.9] Gastrointestinal hemorrhage, unspecified gastrointestinal hemorrhage type [K92.2]  [facility]//MWF-2//TW 84kg  #Anemia of blood loss and chronic kidney disease We will continue Epogen with hemodialysis Lab Results  Component Value Date   HGB 7.8 (L) 07/26/2019  GI evaluation is  in progress by Dr. Vicente Males.  Patient was on Plavix.  Push enteroscopy planned after Plavix is stopped for 3 days.  Also consideration of capsule study.  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 Hemoglobin stable at 7.8.  #End-stage renal disease No urgent indication for dialysis today.  We will plan for hemodialysis again tomorrow.[name]  #Secondary hyperparathyroidism Lab Results  Component Value Date   PTH 91 (H) 03/13/2019   CALCIUM 7.6 (L) 07/23/2019   CAION 0.93 (L) 04/05/2019   PHOS 4.1 05/06/2019   Repeat serum phosphorus tomorrow.    LOS: 1 Jeff Mccallum 5/30/20218:48 AM  [place], [place]  Note: This note was prepared with Dragon dictation. Any transcription errors are unintentional

## 2019-07-26 NOTE — Progress Notes (Addendum)
Progress Note    Kyle [name]  NGE:[id number] DOB: 08/27/1950  DOA: 07/22/2019 PCP: Idelle Crouch, MD      Brief Narrative:    Medical records reviewed and are as summarized below:  Kyle [name] is a 69 y.o. 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  He has had extensive work-up including upper and lower endoscopy, capsule endoscopy and push enteroscopy.  Work-up had revealed nonbleeding AVM that was treated with APC.  He returned to the emergency room on 07/22/2019 withintermittent melena stool that has been going on for several weeks.  He was referred from the outpatient dialysis center because hemoglobin was 5.7.      Assessment/Plan:   Principal Problem:   Melena Active Problems:   Insulin dependent type 2 diabetes mellitus (HCC)   COPD with chronic bronchitis (HCC)   ESRD (end stage renal disease) (HCC)   PAD (peripheral artery disease) (HCC)   Diabetic polyneuropathy associated with type 2 diabetes mellitus ([place])   History of CVA (cerebrovascular accident)   Acute blood loss anemia   ESRD (end stage renal disease) on dialysis ([place])   History of GI bleed   Melena Acute on chronic blood loss anemia   History of recurrent GI bleed Initial hemoglobin in the ED was 6.7. s/p transfusion with 2 units of PRBCs on 07/22/2019.  Hemoglobin is 7.8.  Dr. Marius Ditch, gastroenterologist, recommended transfusion with 1 unit of packed red blood cells in preparation for push enteroscopy tomorrow.  Patient will be transfused with 1 unit of PRBCs today.  Monitor H&H.   Continue IV Protonix.  Plavix has been held with plan for push enteroscopy tomorrow  07/27/2019  -Patient had 2 recent admission for GI bleeding with EGD, colonoscopy, capsule endoscopy and push enteroscopy significant for single nonbleeding colonic angiodysplastic lesion status post monopolar probe treatment.     History of nonhemorrhagic left midbrain CVA in January 2021 -Hold Plavix, continue statin PT and OT recommend SNF at discharge  End-stage renal disease on hemodialysis Follow-up with nephrologist for hemodialysis.  Essential hypotension Continue antihypertensives  Diabetes mellitus type 2 with hyperlipidemia -Sliding scale insulin coverage Hemoglobin A1c 6.4  Hypothyroidism -Continue Synthroid  Seizure disorder Continue Keppra  Body mass index is 26.34 kg/m.   Family Communication/Anticipated D/C date and plan/Code Status   DVT prophylaxis: SCD Code Status: Full code Family Communication: Plan discussed with patient Disposition Plan:    Status is: Inpatient  Remains inpatient appropriate because:Unsafe d/c plan   Dispo: The patient is from: Home              Anticipated d/c is to: SNF              Anticipated d/c date is: 3 days              Patient currently is not medically stable to d/c.                 Subjective:   C/o black stools. He had black stools yesterday and this morning. No hematemesis  Objective:    Vitals:   07/25/19 2025 07/26/19 0400 07/26/19 0405 07/26/19 0810  BP: (!) 157/52 (!) 164/59  (!) 160/61  Pulse: 71 71  68  Resp: 20 20  15  Temp: 98.9 F (37.2 C) 98 F (36.7 C)  98.6 F (37 C)  TempSrc: Oral Oral    SpO2: 92% 91%  96%  Weight:   88.1 kg   Height:       No data found.   Intake/Output Summary (Last 24 hours) at 07/26/2019 0904 Last data filed at 07/25/2019 1016 Gross per 24 hour  Intake 240 ml  Output --  Net 240 ml   Filed Weights   07/24/19 0422 07/24/19 1343 07/26/19 0405  Weight: 86.6 kg 87.4 kg 88.1 kg    Exam:  GEN: No acute distress  SKIN: No rash EYES:  Pale but anicteric,PERRLA ENT: MMM CV: RRR PULM: No wheezing or rales heard ABD: soft, ND, NT, +BS CNS: AAO x 3 EXT: No edema or tenderness   Data Reviewed:   I have personally reviewed following labs and imaging studies:  Labs: Labs show the following:   Basic Metabolic Panel: Recent Labs  Lab 07/22/19 1554 07/23/19 1212  NA 136 138  K 3.3* 4.4  CL 96* 99  CO2 28 28  GLUCOSE 152* 134*  BUN 11 18  CREATININE 3.38* 5.18*  CALCIUM 7.8* 7.6*   GFR Estimated Creatinine Clearance: 14.4 mL/min (A) (by C-G formula based on SCr of 5.18 mg/dL (H)). Liver Function Tests: Recent Labs  Lab 07/22/19 1554  AST 19  ALT 12  ALKPHOS 115  BILITOT 0.6  PROT 6.5  ALBUMIN 3.3*   No results for input(s): LIPASE, AMYLASE in the last 168 hours. No results for input(s): AMMONIA in the last 168 hours. Coagulation profile No results for input(s): INR, PROTIME in the last 168 hours.  CBC: Recent Labs  Lab 07/22/19 1554 07/23/19 1212 07/24/19 0600 07/25/19 0426 07/26/19 0613  WBC 5.1 6.0 7.3 6.1 6.6  NEUTROABS  --  4.3 5.3 4.2 4.6  HGB 6.7* 8.5* 8.2* 7.6* 7.8*  HCT 21.0* 26.7* 25.4* 24.1* 24.5*  MCV 98.1 95.0 94.1 95.6 93.5  PLT 229 205 188 173 189   Cardiac Enzymes: No results for input(s): CKTOTAL, CKMB, CKMBINDEX, TROPONINI in the last 168 hours. BNP (last 3 results) No results for input(s): PROBNP in the last 8760 hours. CBG: Recent Labs  Lab 07/25/19 1630 07/25/19 2025 07/25/19 2325 07/26/19 0359 07/26/19 0812  GLUCAP 163* 159* 134* 143* 139*   D-Dimer: No results for input(s): DDIMER in the last 72 hours. Hgb A1c: No results for input(s): HGBA1C in the last 72 hours. Lipid Profile: No results for input(s): CHOL, HDL, LDLCALC, TRIG, CHOLHDL, LDLDIRECT in the last 72 hours. Thyroid function studies: No results for input(s): TSH, T4TOTAL, T3FREE, THYROIDAB in the last 72 hours.  Invalid input(s): FREET3 Anemia work up: No results for input(s): VITAMINB12,  FOLATE, FERRITIN, TIBC, IRON, RETICCTPCT in the last 72 hours. Sepsis Labs: Recent Labs  Lab 07/23/19 1212 07/24/19 0600 07/25/19 0426 07/26/19 0613  WBC 6.0 7.3 6.1 6.6    Microbiology Recent Results (from the past 240 hour(s))  SARS Coronavirus 2 by RT PCR (hospital order, performed in [facility] hospital lab) Nasopharyngeal Nasopharyngeal Swab     Status: None   Collection Time: 07/22/19 10:17 PM   Specimen: Nasopharyngeal Swab  Result Value Ref Range Status   SARS Coronavirus 2 NEGATIVE NEGATIVE Final    Comment: (NOTE) SARS-CoV-2 target nucleic acids are NOT DETECTED. The SARS-CoV-2 RNA is generally detectable in upper and lower respiratory specimens during the acute phase of infection. The lowest concentration of SARS-CoV-2 viral copies this  assay can detect is 250 copies / mL. A negative result does not preclude SARS-CoV-2 infection and should not be used as the sole basis for treatment or other patient management decisions.  A negative result may occur with improper specimen collection / handling, submission of specimen other than nasopharyngeal swab, presence of viral mutation(s) within the areas targeted by this assay, and inadequate number of viral copies (<250 copies / mL). A negative result must be combined with clinical observations, patient history, and epidemiological information. Fact Sheet for Patients:   StrictlyIdeas.no Fact Sheet for Healthcare Providers: BankingDealers.co.za This test is not yet approved or cleared  by the [place] FDA and has been authorized for detection and/or diagnosis of SARS-CoV-2 by FDA under an Emergency Use Authorization (EUA).  This EUA will remain in effect (meaning this test can be used) for the duration of the COVID-19 declaration under Section 564(b)(1) of the Act, 21 U.S.C. section 360bbb-3(b)(1), unless the authorization is terminated or revoked sooner. Performed at  [facility], [address] [place], [place] [address]   MRSA PCR Screening     Status: None   Collection Time: 07/24/19  4:28 AM   Specimen: Nasopharyngeal  Result Value Ref Range Status   MRSA by PCR NEGATIVE NEGATIVE Final    Comment:        The GeneXpert MRSA Assay (FDA approved for NASAL specimens only), is one component of a comprehensive MRSA colonization surveillance program. It is not intended to diagnose MRSA infection nor to guide or monitor treatment for MRSA infections. Performed at [facility], [address] [place], [place] [address]     Procedures and diagnostic studies:  No results found.  Medications:   . sodium chloride   Intravenous Once  . atorvastatin  80 mg Oral q1800  . calcium acetate  1,334 mg Oral TID WC  . Chlorhexidine Gluconate Cloth  6 each Topical Q0600  . insulin aspart  0-9 Units Subcutaneous Q4H  . levETIRAcetam  500 mg Oral BID  . levothyroxine  175 mcg Oral QAC breakfast  . metoprolol succinate  50 mg Oral Daily  . pantoprazole (PROTONIX) IV  40 mg Intravenous q1800  . sodium chloride flush  10 mL Intravenous Q12H   Continuous Infusions:   LOS: 1 day   Xion Debruyne  [facility]     07/26/2019, 9:04 AM

## 2019-07-26 NOTE — Progress Notes (Signed)
Patient was up going to bathroom using walker. Bed alarm was not turned on at the time, yellow socks were on. Upon returning from bathroom patient states he slipped and fell on his butt. He did not hit head, no open wounds/injury. Vital signs were taken. md notified. Post fall opened up in flow sheets will continue to monitor

## 2019-07-26 NOTE — Progress Notes (Signed)
Physical Therapy Evaluation Patient Details Name: Kyle [name] MRN: [id number] DOB: May 25, 1950 Today's Date: 07/26/2019   History of Present Illness   Kyle [name] is a 69 y.o. 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   Clinical Impression  Patient agrees to PT evaluation.Patient presents with decreased BLE strength, decreased gait speed, decreased standing balance.  Patient's main complaint is pain and inability to participate in desired activities. Patient will benefit from skilled therapy in order to increase LE strength and standing balance and increase gait speed and improve ability to participate in desired activities and decrease falls risk.     Follow Up Recommendations SNF    Equipment Recommendations  Rolling walker with 5" wheels    Recommendations for Other Services       Precautions / Restrictions Precautions Precautions: None Restrictions Weight Bearing Restrictions: No      Mobility  Bed Mobility Overal bed mobility: Modified Independent Bed Mobility: Supine to Sit;Sit to Supine     Supine to sit: Modified independent (Device/Increase time);HOB elevated Sit to supine: Modified independent (Device/Increase time);HOB elevated      Transfers Overall transfer level: Needs assistance Equipment used: Rolling walker (2 wheeled) Transfers: Sit to/from Stand Sit  to Stand: Min assist         General transfer comment: vc for safety  Ambulation/Gait Ambulation/Gait assistance: Min assist Gait Distance (Feet): 300 Feet Assistive device: Rolling walker (2 wheeled) Gait Pattern/deviations: Step-to pattern Gait velocity: (decreased gait speed)      Stairs            Wheelchair Mobility    Modified Rankin (Stroke Patients Only)       Balance Overall balance assessment: Modified Independent Sitting-balance support: No upper extremity supported Sitting balance-Leahy Scale: Normal     Standing balance support: Bilateral upper extremity supported Standing balance-Leahy Scale: Fair                               Pertinent Vitals/Pain Pain Assessment: No/denies pain    Home Living Family/patient expects to be discharged to:: Assisted living               Home Equipment: Walker - 2 wheels;Cane - single point;Bedside commode;Shower seat      Prior Function Level of Independence: Needs assistance   Gait / Transfers Assistance Needed: RW at baseline  ADL's / Homemaking Assistance Needed: Reports requiring assist PRN for meals, LBD, bathing        Hand Dominance   Dominant Hand: Right    Extremity/Trunk Assessment                Communication   Communication: No difficulties  Cognition Arousal/Alertness: Awake/alert Behavior During Therapy: WFL for tasks assessed/performed Overall Cognitive Status: Within Functional Limits for tasks assessed  General Comments      Exercises     Assessment/Plan    PT Assessment Patient needs continued PT services  PT Problem List Decreased strength;Decreased activity tolerance;Decreased balance;Decreased mobility;Decreased safety awareness       PT Treatment Interventions Gait training;Therapeutic activities;Therapeutic exercise;Balance training    PT Goals (Current goals can be found in the Care  Plan section)  Acute Rehab PT Goals Patient Stated Goal: (to walk better) PT Goal Formulation: With patient Time For Goal Achievement: 08/09/19 Potential to Achieve Goals: Good    Frequency Min 2X/week   Barriers to discharge        Co-evaluation               AM-PAC PT "6 Clicks" Mobility  Outcome Measure Help needed turning from your back to your side while in a flat bed without using bedrails?: A Lot Help needed moving from lying on your back to sitting on the side of a flat bed without using bedrails?: A Lot Help needed moving to and from a bed to a chair (including a wheelchair)?: A Lot Help needed standing up from a chair using your arms (e.g., wheelchair or bedside chair)?: A Little Help needed to walk in hospital room?: A Little Help needed climbing 3-5 steps with a railing? : A Lot 6 Click Score: 14    End of Session Equipment Utilized During Treatment: Gait belt Activity Tolerance: Patient tolerated treatment well Patient left: in bed;with call bell/phone within reach Nurse Communication: Mobility status PT Visit Diagnosis: Other abnormalities of gait and mobility (R26.89);Muscle weakness (generalized) (M62.81);Difficulty in walking, not elsewhere classified (R26.2)    Time: 0900-0930 PT Time Calculation (min) (ACUTE ONLY): 30 min   Charges:   PT Evaluation $PT Eval Low Complexity: 1 Low PT Treatments $Gait Training: 8-22 mins $Therapeutic Activity: 8-22 mins          Arelia Sneddon S, PT DPT 07/26/2019, 11:06 AM

## 2019-07-27 ENCOUNTER — Encounter: Payer: Self-pay | Admitting: Internal Medicine

## 2019-07-27 DIAGNOSIS — D62 Acute posthemorrhagic anemia: Secondary | ICD-10-CM | POA: Diagnosis not present

## 2019-07-27 DIAGNOSIS — D649 Anemia, unspecified: Secondary | ICD-10-CM | POA: Diagnosis not present

## 2019-07-27 DIAGNOSIS — E1142 Type 2 diabetes mellitus with diabetic polyneuropathy: Secondary | ICD-10-CM

## 2019-07-27 DIAGNOSIS — K31811 Angiodysplasia of stomach and duodenum with bleeding: Secondary | ICD-10-CM | POA: Diagnosis not present

## 2019-07-27 DIAGNOSIS — K921 Melena: Secondary | ICD-10-CM | POA: Diagnosis not present

## 2019-07-27 DIAGNOSIS — N186 End stage renal disease: Secondary | ICD-10-CM | POA: Diagnosis not present

## 2019-07-27 LAB — CBC WITH DIFFERENTIAL/PLATELET
Abs Immature Granulocytes: 0.03 10*3/uL (ref 0.00–0.07)
Basophils Absolute: 0.1 10*3/uL (ref 0.0–0.1)
Basophils Relative: 2 %
Eosinophils Absolute: 0.2 10*3/uL (ref 0.0–0.5)
Eosinophils Relative: 3 %
HCT: 26.7 % — ABNORMAL LOW (ref 39.0–52.0)
Hemoglobin: 8.6 g/dL — ABNORMAL LOW (ref 13.0–17.0)
Immature Granulocytes: 1 %
Lymphocytes Relative: 15 %
Lymphs Abs: 1 10*3/uL (ref 0.7–4.0)
MCH: 30.4 pg (ref 26.0–34.0)
MCHC: 32.2 g/dL (ref 30.0–36.0)
MCV: 94.3 fL (ref 80.0–100.0)
Monocytes Absolute: 0.5 10*3/uL (ref 0.1–1.0)
Monocytes Relative: 8 %
Neutro Abs: 4.8 10*3/uL (ref 1.7–7.7)
Neutrophils Relative %: 71 %
Platelets: 184 10*3/uL (ref 150–400)
RBC: 2.83 MIL/uL — ABNORMAL LOW (ref 4.22–5.81)
RDW: 15.1 % (ref 11.5–15.5)
WBC: 6.5 10*3/uL (ref 4.0–10.5)
nRBC: 0 % (ref 0.0–0.2)

## 2019-07-27 LAB — POTASSIUM: Potassium: 3.2 mmol/L — ABNORMAL LOW (ref 3.5–5.1)

## 2019-07-27 LAB — BPAM RBC
Blood Product Expiration Date: 202106302359
ISSUE DATE / TIME: 202105301745
Unit Type and Rh: 5100

## 2019-07-27 LAB — GLUCOSE, CAPILLARY
Glucose-Capillary: 106 mg/dL — ABNORMAL HIGH (ref 70–99)
Glucose-Capillary: 123 mg/dL — ABNORMAL HIGH (ref 70–99)
Glucose-Capillary: 126 mg/dL — ABNORMAL HIGH (ref 70–99)
Glucose-Capillary: 133 mg/dL — ABNORMAL HIGH (ref 70–99)
Glucose-Capillary: 161 mg/dL — ABNORMAL HIGH (ref 70–99)
Glucose-Capillary: 214 mg/dL — ABNORMAL HIGH (ref 70–99)
Glucose-Capillary: 214 mg/dL — ABNORMAL HIGH (ref 70–99)

## 2019-07-27 LAB — TYPE AND SCREEN
ABO/RH(D): O POS
Antibody Screen: NEGATIVE
Unit division: 0

## 2019-07-27 NOTE — Progress Notes (Signed)
Brief GI note  Patient underwent hemodialysis today, tolerated well.  No active GI bleed at this time, hemoglobin is stable, 8.6  Anesthesia preferred to wait at least for 4 hours before proceeding with push enteroscopy as patient underwent dialysis today. Since, patient is not actively bleeding, will perform push enteroscopy tomorrow N.p.o. past midnight  Cephas Darby, MD [address]  [place]  [place], [place] [address]  Main: [phone]  Fax: [phone] Pager: [phone]

## 2019-07-27 NOTE — Plan of Care (Signed)
  Problem: Coping: Goal: Level of anxiety will decrease Outcome: Progressing   Problem: Pain Managment: Goal: General experience of comfort will improve Outcome: Progressing   

## 2019-07-27 NOTE — Progress Notes (Signed)
POST HD 

## 2019-07-27 NOTE — Progress Notes (Signed)
HD ended 

## 2019-07-27 NOTE — Anesthesia Preprocedure Evaluation (Addendum)
Anesthesia Evaluation  Patient identified by MRN, date of birth, ID band Patient awake    Reviewed: Allergy & Precautions, H&P , NPO status , Patient's Chart, lab work & pertinent test results  History of Anesthesia Complications Negative for: history of anesthetic complications  Airway Mallampati: II  TM Distance: >3 FB Neck ROM: Full    Dental no notable dental hx. (+) Teeth Intact   Pulmonary COPD,  COPD inhaler, Current Smoker and Patient abstained from smoking.,    Pulmonary exam normal breath sounds clear to auscultation       Cardiovascular hypertension, + Peripheral Vascular Disease   Rhythm:Regular Rate:Normal - Systolic murmurs    Neuro/Psych Seizures -,  PSYCHIATRIC DISORDERS Anxiety Depression R sided weakness CVA, Residual Symptoms    GI/Hepatic Neg liver ROS, Melena H/o GIB   Endo/Other  diabetes, Insulin DependentHypothyroidism   Renal/GU ESRF and DialysisRenal diseaseDialyzed yesterdawy  negative genitourinary   Musculoskeletal   Abdominal   Peds  Hematology  (+) Blood dyscrasia, anemia ,   Anesthesia Other Findings Past Medical History: No date: Anemia No date: Anxiety No date: BPH (benign prostatic hyperplasia) No date: Chronic kidney disease No date: Colon polyps No date: COPD (chronic obstructive pulmonary disease) (HCC) No date: Depression No date: Diabetes mellitus without complication (HCC) No date: Diverticulosis of colon No date: History of kidney stones No date: Hyperlipidemia No date: Hypertension No date: Hypothyroidism No date: Nephrolithiasis No date: Seizures [facility])     Comment:  2015 last seizure No date: Stroke [facility])  Past Surgical History: 03/30/2019: AV FISTULA PLACEMENT; Left     Comment:  Procedure: right brachiocephalic fistula creation;                Surgeon: Marty Heck, MD;  Location: [place];                Service: Vascular;  Laterality:  Left; 04/05/2019: BIOPSY     Comment:  Procedure: BIOPSY;  Surgeon: Wilford Corner, MD;                Location: [place];  Service: Endoscopy;; No date: COLONOSCOPY 05/30/2015: COLONOSCOPY WITH PROPOFOL; N/A     Comment:  Procedure: COLONOSCOPY WITH PROPOFOL;  Surgeon: Manya Silvas, MD;  Location: [facility] ENDOSCOPY;  Service:               Endoscopy;  Laterality: N/A; 12/01/2018: COLONOSCOPY WITH PROPOFOL; N/A     Comment:  Procedure: COLONOSCOPY WITH PROPOFOL;  Surgeon: [place],               [name], MD;  Location: [facility] ENDOSCOPY;  Service:               Gastroenterology;  Laterality: N/A; 04/09/2019: COLONOSCOPY WITH PROPOFOL; N/A     Comment:  Procedure: COLONOSCOPY WITH PROPOFOL;  Surgeon: Carol Ada, MD;  Location: [place];  Service:               Endoscopy;  Laterality: N/A; 05/05/2019: ENTEROSCOPY; N/A     Comment:  Procedure: ENTEROSCOPY;  Surgeon: Ronnette Juniper, MD;                Location: [place];  Service: Gastroenterology;                Laterality: N/A;  Push enteroscopy 04/05/2019: ESOPHAGOGASTRODUODENOSCOPY (EGD)  WITH PROPOFOL; N/A     Comment:  Procedure: ESOPHAGOGASTRODUODENOSCOPY (EGD) WITH               PROPOFOL;  Surgeon: Wilford Corner, MD;  Location: [employer];  Service: Endoscopy;  Laterality: N/A; 05/01/2019: GIVENS CAPSULE STUDY; N/A     Comment:  Procedure: GIVENS CAPSULE STUDY;  Surgeon: Ronald Lobo, MD;  Location: [place];  Service: Endoscopy;              Laterality: N/A; 04/09/2019: HOT HEMOSTASIS; N/A     Comment:  Procedure: HOT HEMOSTASIS (ARGON PLASMA               COAGULATION/BICAP);  Surgeon: Carol Ada, MD;                Location: [place];  Service: Endoscopy;  Laterality:              N/A; 05/05/2019: HOT HEMOSTASIS; N/A     Comment:  Procedure: HOT HEMOSTASIS (ARGON PLASMA               COAGULATION/BICAP);  Surgeon: Ronnette Juniper, MD;  Location:               [place];  Service: Gastroenterology;  Laterality:               N/A; 03/30/2019: INSERTION OF DIALYSIS CATHETER; Right     Comment:  Procedure: Ultrasound guided right internal jugular               tunneled dialysis catheter placement;  Surgeon: Marty Heck, MD;  [facility] OR;  Service: Vascular;               Laterality: Right; No date: kidney stone 06/26/2019: REMOVAL OF A DIALYSIS CATHETER; Right     Comment:  Procedure: REMOVAL OF A DIALYSIS CATHETER ( PD CATH               REMOVAL AND I J CATH REMOVAL;  Surgeon: Katha Cabal, MD;  Location: [facility] ORS;  Service: Vascular;                Laterality: Right; No date: THYROIDECTOMY No date: VARICOCELE EXCISION 05/25/2016: VIDEO BRONCHOSCOPY; Bilateral     Comment:  Procedure: VIDEO BRONCHOSCOPY WITHOUT FLUORO;  Surgeon:               Juanito Doom, MD;  Location: [facility] ENDOSCOPY;  Service:              Cardiopulmonary;  Laterality: Bilateral;  BMI    Body Mass Index: 26.38 kg/m      Reproductive/Obstetrics negative OB ROS                            Anesthesia Physical Anesthesia Plan  ASA: IV  Anesthesia Plan: General   Post-op Pain Management:    Induction: Intravenous  PONV Risk Score and Plan: 1 and Propofol infusion and TIVA  Airway Management Planned: Nasal Cannula  Additional Equipment: None  Intra-op Plan:   Post-operative Plan:   Informed Consent: I have reviewed the patients History and Physical,  chart, labs and discussed the procedure including the risks, benefits and alternatives for the proposed anesthesia with the patient or authorized representative who has indicated his/her understanding and acceptance.     Dental Advisory Given  Plan Discussed with: CRNA and Surgeon  Anesthesia Plan Comments: (Discussed risks of anesthesia with patient, including possibility of difficulty with spontaneous ventilation under anesthesia  necessitating airway intervention, PONV, and rare risks such as cardiac or respiratory or neurological events. Patient understands.)       Anesthesia Quick Evaluation

## 2019-07-27 NOTE — Progress Notes (Signed)
Progress Note    Kyle [name]  GYI:[id number] DOB: 1950/04/19  DOA: 07/22/2019 PCP: Idelle Crouch, MD      Brief Narrative:    Medical records reviewed and are as summarized below:  Kyle [name] is a 69 y.o. 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  He has had extensive work-up including upper and lower endoscopy, capsule endoscopy and push enteroscopy.  Work-up had revealed nonbleeding AVM that was treated with APC.  He returned to the emergency room on 07/22/2019 withintermittent melena stool that has been going on for several weeks.  He was referred from the outpatient dialysis center because hemoglobin was 5.7.      Assessment/Plan:   Principal Problem:   Melena Active Problems:   Insulin dependent type 2 diabetes mellitus (HCC)   COPD with chronic bronchitis (HCC)   ESRD (end stage renal disease) (HCC)   PAD (peripheral artery disease) (HCC)   Diabetic polyneuropathy associated with type 2 diabetes mellitus ([place])   History of CVA (cerebrovascular accident)   Acute blood loss anemia   ESRD (end stage renal disease) on dialysis ([place])   History of GI bleed   Melena Acute on chronic blood loss anemia   History of recurrent GI bleed Initial hemoglobin in the ED was 6.7. s/p transfusion with 2 units of PRBCs on 07/22/2019 and 1 unit of PRBCs on 07/26/2019. Continue IV Protonix.  Plan for push enteroscopy today.  Follow-up with gastroenterologist.  -Patient had 2 recent admission for GI bleeding with EGD, colonoscopy, capsule endoscopy and push enteroscopy significant for single nonbleeding colonic angiodysplastic lesion status post monopolar probe  treatment.     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  Continue statin PT and OT recommend SNF at discharge  End-stage renal disease on hemodialysis Follow-up with nephrologist for hemodialysis.  Essential hypotension Continue antihypertensives  Diabetes mellitus type 2 with hyperlipidemia -Sliding scale insulin coverage Hemoglobin A1c 6.4  Hypothyroidism -Continue Synthroid  Seizure disorder Continue Keppra  Body mass index is 25.38 kg/m.   Family Communication/Anticipated D/C date and plan/Code Status   DVT prophylaxis: SCD Code Status: Full code Family Communication: Plan discussed with patient Disposition Plan:    Status is: Inpatient  Remains inpatient appropriate because:Unsafe d/c plan   Dispo: The patient is from: Home              Anticipated d/c is to: SNF              Anticipated d/c date is: 2 days              Patient currently is not medically stable to d/c.                 Subjective:   Patient was seen on hemodialysis unit while having dialysis.  He said he has not had any bowel movement today.   Objective:    Vitals:   07/27/19 1030 07/27/19 1045 07/27/19 1100 07/27/19 1114  BP: (!) 156/57 (!) 166/59 (!) 168/56 (!) 159/58  Pulse: 68 69 66 71  Resp: 20 15 16 16   Temp:    98 F (36.7 C)  TempSrc:    Oral  SpO2: 96% 97% 98% 96%  Weight:  Height:       No data found.   Intake/Output Summary (Last 24 hours) at 07/27/2019 1151 Last data filed at 07/27/2019 1100 Gross per 24 hour  Intake 540 ml  Output 1617 ml  Net -1077 ml   Filed Weights   07/26/19 0405 07/27/19 0559 07/27/19 0850  Weight: 88.1 kg 88.2 kg 84.9 kg    Exam:  GEN: No acute distress  SKIN: No rash EYES: No pallor or icterus  ENT: MMM CV: Regular rate and rhythm PULM: No wheezing or rales heard ABD: soft, ND, NT, +BS CNS: AAO x 3 EXT: No edema or tenderness   Data Reviewed:   I have  personally reviewed following labs and imaging studies:  Labs: Labs show the following:   Basic Metabolic Panel: Recent Labs  Lab 07/22/19 1554 07/22/19 1554 07/23/19 1212 07/27/19 0949  NA 136  --  138  --   K 3.3*   < > 4.4 3.2*  CL 96*  --  99  --   CO2 28  --  28  --   GLUCOSE 152*  --  134*  --   BUN 11  --  18  --   CREATININE 3.38*  --  5.18*  --   CALCIUM 7.8*  --  7.6*  --    < > = values in this interval not displayed.   GFR Estimated Creatinine Clearance: 14.4 mL/min (A) (by C-G formula based on SCr of 5.18 mg/dL (H)). Liver Function Tests: Recent Labs  Lab 07/22/19 1554  AST 19  ALT 12  ALKPHOS 115  BILITOT 0.6  PROT 6.5  ALBUMIN 3.3*   No results for input(s): LIPASE, AMYLASE in the last 168 hours. No results for input(s): AMMONIA in the last 168 hours. Coagulation profile No results for input(s): INR, PROTIME in the last 168 hours.  CBC: Recent Labs  Lab 07/23/19 1212 07/24/19 0600 07/25/19 0426 07/26/19 0613 07/27/19 0350  WBC 6.0 7.3 6.1 6.6 6.5  NEUTROABS 4.3 5.3 4.2 4.6 4.8  HGB 8.5* 8.2* 7.6* 7.8* 8.6*  HCT 26.7* 25.4* 24.1* 24.5* 26.7*  MCV 95.0 94.1 95.6 93.5 94.3  PLT 205 188 173 189 184   Cardiac Enzymes: No results for input(s): CKTOTAL, CKMB, CKMBINDEX, TROPONINI in the last 168 hours. BNP (last 3 results) No results for input(s): PROBNP in the last 8760 hours. CBG: Recent Labs  Lab 07/26/19 1651 07/26/19 2042 07/27/19 0025 07/27/19 0438 07/27/19 0751  GLUCAP 209* 180* 133* 161* 126*   D-Dimer: No results for input(s): DDIMER in the last 72 hours. Hgb A1c: No results for input(s): HGBA1C in the last 72 hours. Lipid Profile: No results for input(s): CHOL, HDL, LDLCALC, TRIG, CHOLHDL, LDLDIRECT in the last 72 hours. Thyroid function studies: No results for input(s): TSH, T4TOTAL, T3FREE, THYROIDAB in the last 72 hours.  Invalid input(s): FREET3 Anemia work up: No results for input(s): VITAMINB12, FOLATE, FERRITIN,  TIBC, IRON, RETICCTPCT in the last 72 hours. Sepsis Labs: Recent Labs  Lab 07/24/19 0600 07/25/19 0426 07/26/19 0613 07/27/19 0350  WBC 7.3 6.1 6.6 6.5    Microbiology Recent Results (from the past 240 hour(s))  SARS Coronavirus 2 by RT PCR (hospital order, performed in [facility] hospital lab) Nasopharyngeal Nasopharyngeal Swab     Status: None   Collection Time: 07/22/19 10:17 PM   Specimen: Nasopharyngeal Swab  Result Value Ref Range Status   SARS Coronavirus 2 NEGATIVE NEGATIVE Final    Comment: (NOTE) SARS-CoV-2 target nucleic  acids are NOT DETECTED. The SARS-CoV-2 RNA is generally detectable in upper and lower respiratory specimens during the acute phase of infection. The lowest concentration of SARS-CoV-2 viral copies this assay can detect is 250 copies / mL. A negative result does not preclude SARS-CoV-2 infection and should not be used as the sole basis for treatment or other patient management decisions.  A negative result may occur with improper specimen collection / handling, submission of specimen other than nasopharyngeal swab, presence of viral mutation(s) within the areas targeted by this assay, and inadequate number of viral copies (<250 copies / mL). A negative result must be combined with clinical observations, patient history, and epidemiological information. Fact Sheet for Patients:   StrictlyIdeas.no Fact Sheet for Healthcare Providers: BankingDealers.co.za This test is not yet approved or cleared  by the [place] FDA and has been authorized for detection and/or diagnosis of SARS-CoV-2 by FDA under an Emergency Use Authorization (EUA).  This EUA will remain in effect (meaning this test can be used) for the duration of the COVID-19 declaration under Section 564(b)(1) of the Act, 21 U.S.C. section 360bbb-3(b)(1), unless the authorization is terminated or revoked sooner. Performed at [facility], [address] [place], [place] [address]   MRSA PCR Screening     Status: None   Collection Time: 07/24/19  4:28 AM   Specimen: Nasopharyngeal  Result Value Ref Range Status   MRSA by PCR NEGATIVE NEGATIVE Final    Comment:        The GeneXpert MRSA Assay (FDA approved for NASAL specimens only), is one component of a comprehensive MRSA colonization surveillance program. It is not intended to diagnose MRSA infection nor to guide or monitor treatment for MRSA infections. Performed at [facility], [address] [place], [place] [address]     Procedures and diagnostic studies:  No results found.  Medications:   . sodium chloride   Intravenous Once  . sodium chloride   Intravenous Once  . atorvastatin  80 mg Oral q1800  . calcium acetate  1,334 mg Oral TID WC  . Chlorhexidine Gluconate Cloth  6 each Topical Q0600  . insulin aspart  0-9 Units Subcutaneous Q4H  . levETIRAcetam  500 mg Oral BID  . levothyroxine  175 mcg Oral QAC breakfast  . metoprolol succinate  50 mg Oral Daily  . pantoprazole (PROTONIX) IV  40 mg Intravenous q1800  . sodium chloride flush  10 mL Intravenous Q12H   Continuous Infusions:   LOS: 2 days   Warner Laduca  [facility]     07/27/2019, 11:51 AM

## 2019-07-27 NOTE — Progress Notes (Signed)
HD started. 

## 2019-07-27 NOTE — Progress Notes (Signed)
[place], [place] 07/27/19  Subjective:   Hospital day # 2 Patient completed dialysis treatment today. Tolerated well. Hemoglobin currently 8.6.   Objective:  Vital signs in last 24 hours:  Temp:  [97.8 F (36.6 C)-99.1 F (37.3 C)] 98 F (36.7 C) (05/31 1114) Pulse Rate:  [61-77] 75 (05/31 1201) Resp:  [15-20] 18 (05/31 1201) BP: (155-173)/(55-88) 168/63 (05/31 1201) SpO2:  [94 %-99 %] 99 % (05/31 1201) Weight:  [84.9 kg-88.2 kg] 84.9 kg (05/31 0850)  Weight change: 0.136 kg Filed Weights   07/26/19 0405 07/27/19 0559 07/27/19 0850  Weight: 88.1 kg 88.2 kg 84.9 kg    Intake/Output:    Intake/Output Summary (Last 24 hours) at 07/27/2019 1330 Last data filed at 07/27/2019 1100 Gross per 24 hour  Intake 540 ml  Output 1617 ml  Net -1077 ml    Physical Exam: General:  No acute distress, laying in the bed  HEENT  anicteric, moist oral mucous membrane  Pulm/lungs  normal breathing effort, lungs are clear to auscultation  CVS/Heart  regular rhythm, no rub or gallop  Abdomen:   Soft, nontender  Extremities:  No peripheral edema  Neurologic:  Alert, oriented, able to follow commands  Skin:  No acute rashes  Left arm AV fistula   Basic Metabolic Panel:  Recent Labs  Lab 07/22/19 1554 07/23/19 1212 07/27/19 0949  NA 136 138  --   K 3.3* 4.4 3.2*  CL 96* 99  --   CO2 28 28  --   GLUCOSE 152* 134*  --   BUN 11 18  --   CREATININE 3.38* 5.18*  --   CALCIUM 7.8* 7.6*  --      CBC: Recent Labs  Lab 07/23/19 1212 07/24/19 0600 07/25/19 0426 07/26/19 0613 07/27/19 0350  WBC 6.0 7.3 6.1 6.6 6.5  NEUTROABS 4.3 5.3 4.2 4.6 4.8  HGB 8.5* 8.2* 7.6* 7.8* 8.6*  HCT 26.7* 25.4* 24.1* 24.5* 26.7*  MCV 95.0 94.1 95.6 93.5 94.3  PLT 205 188 173 189 184      Lab Results  Component Value Date   HEPBSAG NON REACTIVE 05/01/2019   HEPBSAB Reactive (A) 05/05/2019      Microbiology:  Recent Results (from the past 240 hour(s))   SARS Coronavirus 2 by RT PCR (hospital order, performed in [place] hospital lab) Nasopharyngeal Nasopharyngeal Swab     Status: None   Collection Time: 07/22/19 10:17 PM   Specimen: Nasopharyngeal Swab  Result Value Ref Range Status   SARS Coronavirus 2 NEGATIVE NEGATIVE Final    Comment: (NOTE) SARS-CoV-2 target nucleic acids are NOT DETECTED. The SARS-CoV-2 RNA is generally detectable in upper and lower respiratory specimens during the acute phase of infection. The lowest concentration of SARS-CoV-2 viral copies this assay can detect is 250 copies / mL. A negative result does not preclude SARS-CoV-2 infection and should not be used as the sole basis for treatment or other patient management decisions.  A negative result may occur with improper specimen collection / handling, submission of specimen other than nasopharyngeal swab, presence of viral mutation(s) within the areas targeted by this assay, and inadequate number of viral copies (<250 copies / mL). A negative result must be combined with clinical observations, patient history, and epidemiological information. Fact Sheet for Patients:   StrictlyIdeas.no Fact Sheet for Healthcare Providers: BankingDealers.co.za This test is not yet approved or cleared  by the [place] FDA and has been authorized for detection and/or diagnosis of  SARS-CoV-2 by FDA under an Emergency Use Authorization (EUA).  This EUA will remain in effect (meaning this test can be used) for the duration of the COVID-19 declaration under Section 564(b)(1) of the Act, 21 U.S.C. section 360bbb-3(b)(1), unless the authorization is terminated or revoked sooner. Performed at [facility], [address] [place], [place] [address]   MRSA PCR Screening     Status: None   Collection Time: 07/24/19  4:28 AM   Specimen: Nasopharyngeal  Result Value Ref Range Status   MRSA by PCR NEGATIVE NEGATIVE Final     Comment:        The GeneXpert MRSA Assay (FDA approved for NASAL specimens only), is one component of a comprehensive MRSA colonization surveillance program. It is not intended to diagnose MRSA infection nor to guide or monitor treatment for MRSA infections. Performed at [facility], [address] [place], [place] [address]     Coagulation Studies: No results for input(s): LABPROT, INR in the last 72 hours.  Urinalysis: No results for input(s): COLORURINE, LABSPEC, PHURINE, GLUCOSEU, HGBUR, BILIRUBINUR, KETONESUR, PROTEINUR, UROBILINOGEN, NITRITE, LEUKOCYTESUR in the last 72 hours.  Invalid input(s): APPERANCEUR    Imaging: No results found.   Medications:    . sodium chloride   Intravenous Once  . sodium chloride   Intravenous Once  . atorvastatin  80 mg Oral q1800  . calcium acetate  1,334 mg Oral TID WC  . Chlorhexidine Gluconate Cloth  6 each Topical Q0600  . insulin aspart  0-9 Units Subcutaneous Q4H  . levETIRAcetam  500 mg Oral BID  . levothyroxine  175 mcg Oral QAC breakfast  . metoprolol succinate  50 mg Oral Daily  . pantoprazole (PROTONIX) IV  40 mg Intravenous q1800  . sodium chloride flush  10 mL Intravenous Q12H   acetaminophen **OR** acetaminophen, doxylamine (Sleep)  Assessment/ Plan:  69 y.o. 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, insulin-dependent Hypertension Hyperlipidemia Hypothyroidism Seizure disorder Hospitalization for GI bleed in February and March 2021 Capsule endoscopy, push enteroscopy with nonbleeding AVMs     Admitted on 07/22/2019 for Acute blood loss anemia [D62] Symptomatic anemia [D64.9] Gastrointestinal hemorrhage, unspecified gastrointestinal hemorrhage type [K92.2]  [facility]//MWF-2//TW 84kg  #Anemia of blood loss and chronic kidney disease We will  continue Epogen with hemodialysis Lab Results  Component Value Date   HGB 8.6 (L) 07/27/2019  GI evaluation is in progress by Dr. Vicente Males.  Patient was on Plavix.  Push enteroscopy planned after Plavix is stopped for 3 days.  Also consideration of capsule study.  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 Hemoglobin currently 8.6.  Additional GI work-up as per gastroenterology today.  #End-stage renal disease Patient completed dialysis treatment today.  Tolerated well.  Next dialysis treatment on Wednesday if still here.  #Secondary hyperparathyroidism Lab Results  Component Value Date   PTH 91 (H) 03/13/2019   CALCIUM 7.6 (L) 07/23/2019   CAION 0.93 (L) 04/05/2019   PHOS 4.1 05/06/2019   Phosphorus has been under reasonable control.  Maintain the patient on current dosage of calcium acetate.    LOS: 2 Kelven Flater 5/31/20211:30 PM  [place], [place]  Note: This note was prepared with Dragon dictation. Any transcription errors are unintentional

## 2019-07-28 ENCOUNTER — Encounter
Admission: EM | Disposition: A | Discharge status: Skilled Nursing Facility | Payer: Self-pay | Source: Home / Self Care | Attending: Internal Medicine

## 2019-07-28 ENCOUNTER — Encounter: Payer: Self-pay | Admitting: Internal Medicine

## 2019-07-28 ENCOUNTER — Inpatient Hospital Stay: Payer: Medicare Other | Admitting: Anesthesiology

## 2019-07-28 DIAGNOSIS — H4902 Third [oculomotor] nerve palsy, left eye: Secondary | ICD-10-CM | POA: Diagnosis not present

## 2019-07-28 DIAGNOSIS — K31819 Angiodysplasia of stomach and duodenum without bleeding: Secondary | ICD-10-CM | POA: Diagnosis not present

## 2019-07-28 DIAGNOSIS — I120 Hypertensive chronic kidney disease with stage 5 chronic kidney disease or end stage renal disease: Secondary | ICD-10-CM | POA: Diagnosis not present

## 2019-07-28 DIAGNOSIS — K222 Esophageal obstruction: Secondary | ICD-10-CM

## 2019-07-28 DIAGNOSIS — I69398 Other sequelae of cerebral infarction: Secondary | ICD-10-CM | POA: Diagnosis not present

## 2019-07-28 DIAGNOSIS — K922 Gastrointestinal hemorrhage, unspecified: Secondary | ICD-10-CM | POA: Diagnosis present

## 2019-07-28 DIAGNOSIS — K219 Gastro-esophageal reflux disease without esophagitis: Secondary | ICD-10-CM | POA: Diagnosis not present

## 2019-07-28 DIAGNOSIS — K921 Melena: Secondary | ICD-10-CM | POA: Diagnosis not present

## 2019-07-28 DIAGNOSIS — K317 Polyp of stomach and duodenum: Secondary | ICD-10-CM

## 2019-07-28 DIAGNOSIS — G40909 Epilepsy, unspecified, not intractable, without status epilepticus: Secondary | ICD-10-CM | POA: Diagnosis not present

## 2019-07-28 DIAGNOSIS — Z992 Dependence on renal dialysis: Secondary | ICD-10-CM

## 2019-07-28 DIAGNOSIS — Z20822 Contact with and (suspected) exposure to covid-19: Secondary | ICD-10-CM | POA: Diagnosis not present

## 2019-07-28 DIAGNOSIS — F419 Anxiety disorder, unspecified: Secondary | ICD-10-CM | POA: Diagnosis not present

## 2019-07-28 DIAGNOSIS — N186 End stage renal disease: Secondary | ICD-10-CM | POA: Diagnosis not present

## 2019-07-28 DIAGNOSIS — K5520 Angiodysplasia of colon without hemorrhage: Secondary | ICD-10-CM | POA: Diagnosis not present

## 2019-07-28 DIAGNOSIS — D649 Anemia, unspecified: Secondary | ICD-10-CM | POA: Diagnosis present

## 2019-07-28 DIAGNOSIS — N400 Enlarged prostate without lower urinary tract symptoms: Secondary | ICD-10-CM | POA: Diagnosis not present

## 2019-07-28 DIAGNOSIS — E785 Hyperlipidemia, unspecified: Secondary | ICD-10-CM | POA: Diagnosis not present

## 2019-07-28 DIAGNOSIS — J449 Chronic obstructive pulmonary disease, unspecified: Secondary | ICD-10-CM | POA: Diagnosis not present

## 2019-07-28 DIAGNOSIS — E1142 Type 2 diabetes mellitus with diabetic polyneuropathy: Secondary | ICD-10-CM | POA: Diagnosis not present

## 2019-07-28 DIAGNOSIS — E1151 Type 2 diabetes mellitus with diabetic peripheral angiopathy without gangrene: Secondary | ICD-10-CM | POA: Diagnosis not present

## 2019-07-28 DIAGNOSIS — D1339 Benign neoplasm of other parts of small intestine: Secondary | ICD-10-CM | POA: Diagnosis not present

## 2019-07-28 DIAGNOSIS — I69351 Hemiplegia and hemiparesis following cerebral infarction affecting right dominant side: Secondary | ICD-10-CM | POA: Diagnosis not present

## 2019-07-28 DIAGNOSIS — D631 Anemia in chronic kidney disease: Secondary | ICD-10-CM | POA: Diagnosis not present

## 2019-07-28 DIAGNOSIS — K31811 Angiodysplasia of stomach and duodenum with bleeding: Secondary | ICD-10-CM | POA: Diagnosis not present

## 2019-07-28 DIAGNOSIS — E1122 Type 2 diabetes mellitus with diabetic chronic kidney disease: Secondary | ICD-10-CM | POA: Diagnosis not present

## 2019-07-28 DIAGNOSIS — N2581 Secondary hyperparathyroidism of renal origin: Secondary | ICD-10-CM | POA: Diagnosis not present

## 2019-07-28 DIAGNOSIS — K5730 Diverticulosis of large intestine without perforation or abscess without bleeding: Secondary | ICD-10-CM | POA: Diagnosis not present

## 2019-07-28 DIAGNOSIS — F17210 Nicotine dependence, cigarettes, uncomplicated: Secondary | ICD-10-CM | POA: Diagnosis not present

## 2019-07-28 DIAGNOSIS — D62 Acute posthemorrhagic anemia: Secondary | ICD-10-CM | POA: Diagnosis not present

## 2019-07-28 HISTORY — PX: ENTEROSCOPY: SHX5533

## 2019-07-28 LAB — GLUCOSE, CAPILLARY
Glucose-Capillary: 104 mg/dL — ABNORMAL HIGH (ref 70–99)
Glucose-Capillary: 147 mg/dL — ABNORMAL HIGH (ref 70–99)
Glucose-Capillary: 147 mg/dL — ABNORMAL HIGH (ref 70–99)
Glucose-Capillary: 153 mg/dL — ABNORMAL HIGH (ref 70–99)
Glucose-Capillary: 162 mg/dL — ABNORMAL HIGH (ref 70–99)
Glucose-Capillary: 176 mg/dL — ABNORMAL HIGH (ref 70–99)
Glucose-Capillary: 202 mg/dL — ABNORMAL HIGH (ref 70–99)

## 2019-07-28 SURGERY — ENTEROSCOPY
Anesthesia: General

## 2019-07-28 MED ORDER — PROPOFOL 10 MG/ML IV BOLUS
INTRAVENOUS | Status: DC | PRN
  Administered 2019-07-28: 10 mg via INTRAVENOUS
  Administered 2019-07-28: 40 mg via INTRAVENOUS

## 2019-07-28 MED ORDER — PROPOFOL 500 MG/50ML IV EMUL
INTRAVENOUS | Status: AC
  Filled 2019-07-28: qty 50

## 2019-07-28 MED ORDER — SODIUM CHLORIDE 0.9 % IV SOLN: INTRAVENOUS | Status: DC | PRN

## 2019-07-28 MED ORDER — PROPOFOL 500 MG/50ML IV EMUL
INTRAVENOUS | Status: DC | PRN
  Administered 2019-07-28: 100 ug/kg/min via INTRAVENOUS

## 2019-07-28 MED ORDER — LIDOCAINE HCL (CARDIAC) PF 100 MG/5ML IV SOSY
PREFILLED_SYRINGE | INTRAVENOUS | Status: DC | PRN
  Administered 2019-07-28: 50 mg via INTRAVENOUS

## 2019-07-28 NOTE — Anesthesia Postprocedure Evaluation (Signed)
Anesthesia Post Note  Patient: Kyle [name]  Procedure(s) Performed: ENTEROSCOPY (N/A )  Patient location during evaluation: Endoscopy Anesthesia Type: General Level of consciousness: awake and alert Pain management: pain level controlled Vital Signs Assessment: post-procedure vital signs reviewed and stable Respiratory status: spontaneous breathing, nonlabored ventilation, respiratory function stable and patient connected to nasal cannula oxygen Cardiovascular status: blood pressure returned to baseline and stable Postop Assessment: no apparent nausea or vomiting Anesthetic complications: no     Last Vitals:  Vitals:   07/28/19 0741 07/28/19 1028  BP: (!) 165/65 (!) 156/59  Pulse: 61 78  Resp: 15 17  Temp: 36.6 C 36.7 C  SpO2: 98% 94%    Last Pain:  Vitals:   07/28/19 1038  TempSrc:   PainSc: 0-No pain                 Arita Miss

## 2019-07-28 NOTE — Transfer of Care (Signed)
Immediate Anesthesia Transfer of Care Note  Patient: Kyle [name]  Procedure(s) Performed: ENTEROSCOPY (N/A )  Patient Location: PACU and Endoscopy Unit  Anesthesia Type:General  Level of Consciousness: drowsy  Airway & Oxygen Therapy: Patient Spontanous Breathing and Patient connected to nasal cannula oxygen  Post-op Assessment: Report given to RN and Post -op Vital signs reviewed and stable  Post vital signs: Reviewed and stable  Last Vitals:  Vitals Value Taken Time  BP 156/59 07/28/19 1029  Temp    Pulse 76 07/28/19 1030  Resp 17 07/28/19 1030  SpO2 94 % 07/28/19 1030  Vitals shown include unvalidated device data.  Last Pain:  Vitals:   07/27/19 2030  TempSrc:   PainSc: 0-No pain         Complications: No apparent anesthesia complications

## 2019-07-28 NOTE — NC FL2 (Signed)
[place] LEVEL OF CARE SCREENING TOOL     IDENTIFICATION  Patient Name: Kyle [name] Birthdate: 01/14/1951 Sex: male Admission Date (Current Location): 07/22/2019  [place] and [place] Number:  Engineering geologist and Address:  [facility], [address] [place], [place] [address]      Provider Number: [id number]  Attending Physician Name and Address:  Jennye Boroughs, MD  Relative Name and Phone Number:  Zamir Staples [phone]    Current Level of Care: SNF Recommended Level of Care: [place] Prior Approval Number:    Date Approved/Denied:   PASRR Number: [id number]  Discharge Plan: SNF    Current Diagnoses: Patient Active Problem List   Diagnosis Date Noted  . Melena 07/22/2019  . History of GI bleed 07/22/2019  . Current every day smoker 06/22/2019  . Falls frequently 06/22/2019  . Palliative care by specialist   . Goals of care, counseling/discussion   . Orthostasis 05/02/2019  . ESRD (end stage renal disease) on dialysis ([place])   . Acute blood loss anemia 04/30/2019  . Acute on chronic blood loss anemia 04/29/2019  . History of CVA (cerebrovascular accident) 04/29/2019  . GI bleed 04/04/2019  . 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[place]) 03/20/2019  . Generalized weakness   . Diabetic polyneuropathy associated with type 2 diabetes mellitus ([place])   . Cerebrovascular accident (CVA) ([place])   . PAD (peripheral artery disease) ([place]) 03/17/2019  . Ptosis of eyelid, left 03/15/2019  . Physical deconditioning 03/14/2019  . Hypomagnesemia 03/13/2019  . Hyperphosphatemia 03/13/2019  . ESRD (end stage renal disease) ([place]) 03/13/2019  . Chronic kidney disease with peritoneal dialysis as preferred modality, stage 5 ([place]) 03/13/2019  . Prolonged QT interval 03/13/2019  . Hypertension associated with diabetes ([place]) 03/13/2019  . Hypothyroidism 03/13/2019  . Chronic diarrhea 03/13/2019  . Hypocalcemia 03/12/2019  . Ataxia  03/10/2019  . Dizziness 03/10/2019  . Vision problem 03/10/2019  . Hypokalemia 09/16/2018  . ESRD on peritoneal dialysis ([place]) 03/04/2018  . Lung nodule 06/12/2016  . Narrowing of airway   . Cigarette smoker 05/24/2016  . Collapse of right lung 05/24/2016  . COPD with chronic bronchitis ([place]) 05/24/2016  . Diarrhea due to malabsorption 07/01/2015  . Hx of adenomatous colonic polyps 07/01/2015  . Acne 05/20/2015  . Calculus of kidney 05/20/2015  . Chest pain, non-cardiac 05/20/2015  . Seizure disorder ([place]) 05/20/2015  . Current tobacco use 05/20/2015  . Chronic kidney disease (CKD), stage III (moderate) 03/29/2015  . Insulin dependent type 2 diabetes mellitus ([place]) 03/15/2015  . Lipoma of shoulder 03/08/2014  . Other synovitis and tenosynovitis, right shoulder 03/08/2014  . Right supraspinatus tenosynovitis 03/08/2014  . Bursitis of elbow 02/15/2014  . Olecranon bursitis of right elbow 02/15/2014  . Absolute anemia 08/08/2013  . 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 Back pain, chronic 08/08/2013  . Chronic obstructive pulmonary disease ([place]) 08/08/2013  . BP (high blood pressure) 08/08/2013  . Hyperlipidemia associated with type 2 diabetes mellitus ([place]) 08/08/2013  . Acne erythematosa 08/08/2013    Orientation RESPIRATION BLADDER Height & Weight     Self, Time, Situation, Place  Normal Continent Weight: 86.8 kg Height:  6' (182.9 cm)  BEHAVIORAL SYMPTOMS/MOOD NEUROLOGICAL BOWEL NUTRITION STATUS      Continent Diet(Regular)  AMBULATORY STATUS COMMUNICATION OF NEEDS Skin   Limited Assist Verbally Normal                       Personal  Care Assistance Level of Assistance  Feeding, Dressing, Bathing Bathing Assistance: Limited assistance Feeding assistance: Independent Dressing Assistance: Limited assistance     Functional Limitations Info  Sight Sight Info: Impaired(glasses)        SPECIAL CARE FACTORS FREQUENCY  PT (By licensed PT), OT (By licensed OT)      PT Frequency: 5x a week OT Frequency: 5x a week            Contractures Contractures Info: Present    Additional Factors Info  (HD)               Current Medications (07/28/2019):  This is the current hospital active medication list Current Facility-Administered Medications  Medication Dose Route Frequency Provider Last Rate Last Admin  . 0.9 %  sodium chloride infusion (Manually program via Guardrails IV Fluids)   Intravenous Once Judd Gaudier V, MD      . 0.9 %  sodium chloride infusion (Manually program via Guardrails IV Fluids)   Intravenous Once Jennye Boroughs, MD      . acetaminophen (TYLENOL) tablet 650 mg  650 mg Oral Q6H PRN Athena Masse, MD       Or  . acetaminophen (TYLENOL) suppository 650 mg  650 mg Rectal Q6H PRN Athena Masse, MD      . atorvastatin (LIPITOR) tablet 80 mg  80 mg Oral q1800 Athena Masse, MD   80 mg at 07/27/19 1649  . calcium acetate (PHOSLO) capsule 1,334 mg  1,334 mg Oral TID WC Athena Masse, MD   1,334 mg at 07/27/19 1651  . Chlorhexidine Gluconate Cloth 2 % PADS 6 each  6 each Topical Q0600 Murlean Iba, MD   6 each at 07/28/19 0530  . doxylamine (Sleep) (UNISOM) tablet 25 mg  25 mg Oral QHS PRN Lang Snow, NP   25 mg at 07/24/19 2154  . insulin aspart (novoLOG) injection 0-9 Units  0-9 Units Subcutaneous Q4H Athena Masse, MD   2 Units at 07/28/19 [phone]  . levETIRAcetam (KEPPRA) tablet 500 mg  500 mg Oral BID Athena Masse, MD   500 mg at 07/27/19 2027  . levothyroxine (SYNTHROID) tablet 175 mcg  175 mcg Oral QAC breakfast Athena Masse, MD   175 mcg at 07/28/19 0539  . metoprolol succinate (TOPROL-XL) 24 hr tablet 50 mg  50 mg Oral Daily Athena Masse, MD   50 mg at 07/27/19 1219  . pantoprazole (PROTONIX) injection 40 mg  40 mg Intravenous q1800 Athena Masse, MD   40 mg at 07/27/19 1648  . sodium chloride flush (NS) 0.9 % injection 10 mL  10 mL Intravenous Q12H Jennye Boroughs, MD   10 mL at 07/27/19 2028      Discharge Medications: Please see discharge summary for a list of discharge medications.  Relevant Imaging Results:  Relevant Lab Results:   Additional Information [phone] - ESRD Dialysis M,W,FRI  Victorino Dike, RN

## 2019-07-28 NOTE — Op Note (Signed)
[facility] Gastroenterology Patient Name: Kyle [name] Procedure Date: 07/28/2019 9:42 AM MRN: [id number] Account #: [id number] Date of Birth: 1950-03-31 Admit Type: Outpatient Age: 69 Room: [facility] ENDO ROOM 1 Gender: Male Note Status: Finalized Procedure:             Small bowel enteroscopy Indications:           Melena Providers:             Varnita B. Bonna Gains MD, MD Medicines:             General Anesthesia Complications:         No immediate complications. Procedure:             Pre-Anesthesia Assessment:                        - Prior to the procedure, a History and Physical was                         performed, and patient medications and allergies were                         reviewed. The patient is competent. The risks and                         benefits of the procedure and the sedation options and                         risks were discussed with the patient. All questions                         were answered and informed consent was obtained.                         Patient identification and proposed procedure were                         verified by the physician, the nurse, the                         anesthesiologist, the anesthetist and the technician                         in the pre-procedure area in the procedure room in the                         endoscopy suite. Mental Status Examination: alert and                         oriented. Airway Examination: normal oropharyngeal                         airway and neck mobility. Respiratory Examination:                         clear to auscultation. CV Examination: normal.                         Prophylactic Antibiotics: The patient does not require  prophylactic antibiotics. Prior Anticoagulants: The                         patient has taken Plavix (clopidogrel), last dose was                         6 days prior to procedure. ASA Grade Assessment: III -           A patient with severe systemic disease. After                         reviewing the risks and benefits, the patient was                         deemed in satisfactory condition to undergo the                         procedure. The anesthesia plan was to use general                         anesthesia. Immediately prior to administration of                         medications, the patient was re-assessed for adequacy                         to receive sedatives. The heart rate, respiratory                         rate, oxygen saturations, blood pressure, adequacy of                         pulmonary ventilation, and response to care were                         monitored throughout the procedure. The physical                         status of the patient was re-assessed after the                         procedure.                        After obtaining informed consent, the endoscope was                         passed under direct vision. Throughout the procedure,                         the patient's blood pressure, pulse, and oxygen                         saturations were monitored continuously. The                         Colonoscope was introduced through the mouth and  advanced to the jejunum, to the 160 cm mark (from the                         incisors). The small bowel enteroscopy was                         accomplished with ease. The patient tolerated the                         procedure well. Findings:      A widely patent Schatzki ring was found at the gastroesophageal junction.      A single 5 mm mucosal nodule with a localized distribution was found at       the gastroesophageal junction. Biopsies were taken with a cold forceps       for histology.      A single 5 mm angioectasia with no bleeding was found in the gastric       fundus. Coagulation for bleeding prevention using argon plasma was       successful.      A single 12 mm sessile polyp  with no bleeding was found in the duodenal       bulb. Biopsies were taken with a cold forceps for histology.      Nodular mucosa was found in the duodenal bulb. Biopsies were taken with       a cold forceps for histology.      A single angioectasia with no bleeding was found in the second portion       of the duodenum. Coagulation for bleeding prevention using argon plasma       was successful.      A medium-sized submucosal (with the above mentioned AVM on top of it)       mass with no bleeding was found in the second portion of the duodenum.       This appears to be the same AVM and submucosal lesion identified in the       March 2021 procedure. Due to the presence of AVM the submucosal lesion       was not biopsied.      A 7 mm flat polyp with no bleeding was found in the proximal jejunum.       Biopsies were taken with a cold forceps for histology. This was present       at 85 cm from incisors      A medium-sized submucosal mass with no bleeding was found in the       proximal jejunum. Biopsies were taken with a cold forceps for histology.       This was present at 80 cm from the incisors.      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                        - Mucosal nodule found in the esophagus. Biopsied.                        - A single non-bleeding angioectasia in the stomach.  Treated with argon plasma coagulation (APC).                        - A single duodenal polyp. Biopsied.                        - Nodular mucosa in the duodenal bulb. Biopsied.                        - A single non-bleeding angioectasia in the duodenum.                         Treated with argon plasma coagulation (APC).                        - Duodenal mass.                        - Jejunal polyp(s). Biopsied.                         - Jejunal mass. Biopsied.                        - The two submucosal lesions mentioned in the small                         bowel above could represent lipomas                        - No evidence of active or recent bleeding seen                         throughout the exam. Recommendation:        - Await pathology results.                        - Continue Serial CBCs and transfuse PRN                        - Pt will need close follow up by primary GI as an                         outpatient do discuss further workup of the duodenal                         and jejunal submucosal lesions based on pathology                         results. Need for EUS is a possibility.                        -The duodenal bulb polyp and the jejunal polyp may                         also need further intervention based on pathology                         results                        -  The findings and recommendations were discussed with                         the patient.                        - Return to GI clinic in 2 weeks.                        - Return to primary care physician in 2 weeks.                        - Clear liquid diet today, then advance as tolerated                         to resume regular diet if hemoglobin remains stable                         and no further active bleeding. Procedure Code(s):     --- Professional ---                        [phone], 71, Small intestinal endoscopy, enteroscopy                         beyond second portion of duodenum, not including                         ileum; with control of bleeding (eg, injection,                         bipolar cautery, unipolar cautery, laser, heater                         probe, stapler, plasma coagulator)                        44361, Small intestinal endoscopy, enteroscopy beyond                         second portion of duodenum, not including ileum; with                         biopsy, single or multiple  Diagnosis Code(s):     --- Professional ---                        K22.2, Esophageal obstruction                        K22.8, Other specified diseases of esophagus                        K31.819, Angiodysplasia of stomach and duodenum                         without bleeding                        K31.7, Polyp of stomach and duodenum  K31.89, Other diseases of stomach and duodenum                        D13.39, Benign neoplasm of other parts of small                         intestine                        K63.89, Other specified diseases of intestine                        K92.1, Melena (includes Hematochezia) CPT copyright 2019 American Medical Association. All rights reserved. The codes documented in this report are preliminary and upon coder review may  be revised to meet current compliance requirements.  Vonda Antigua, MD Margretta Sidle B. Bonna Gains MD, MD 07/28/2019 11:06:06 AM This report has been signed electronically. Number of Addenda: 0 Note Initiated On: 07/28/2019 9:42 AM Estimated Blood Loss:  Estimated blood loss: none.      [facility]

## 2019-07-28 NOTE — Plan of Care (Signed)
Pt tolerated well the push enteroscopy this AM without complications.  No further melena noted.  Pt awaiting SNF placement (likely tomorrow).   Problem: Education: Goal: Knowledge of General Education information will improve Description: Including pain rating scale, medication(s)/side effects and non-pharmacologic comfort measures Outcome: Progressing   Problem: Health Behavior/Discharge Planning: Goal: Ability to manage health-related needs will improve Outcome: Progressing   Problem: Clinical Measurements: Goal: Ability to maintain clinical measurements within normal limits will improve Outcome: Progressing Goal: Will remain free from infection Outcome: Progressing Goal: Diagnostic test results will improve Outcome: Progressing Goal: Respiratory complications will improve Outcome: Progressing Goal: Cardiovascular complication will be avoided Outcome: Progressing   Problem: Elimination: Goal: Will not experience complications related to bowel motility Outcome: Progressing Goal: Will not experience complications related to urinary retention Outcome: Progressing   Problem: Bowel/Gastric: Goal: Will show no signs and symptoms of gastrointestinal bleeding Outcome: Progressing

## 2019-07-28 NOTE — Care Management Important Message (Signed)
Important Message  Patient Details  Name: Kyle [name] MRN: [id number] Date of Birth: 11/16/1950   Medicare Important Message Given:  Yes     Dannette Barbara 07/28/2019, 11:30 AM

## 2019-07-28 NOTE — Progress Notes (Signed)
[place], [place] 07/28/19  Subjective:   Hospital day # 3 Patient had dialysis yesterday. Tolerated well. He underwent push enteroscopy today. There was an angiectasia in the stomach and duodenum which were treated with argon plasma coagulation.   Objective:  Vital signs in last 24 hours:  Temp:  [97.8 F (36.6 C)-98.4 F (36.9 C)] 98 F (36.7 C) (06/01 1028) Pulse Rate:  [61-78] 78 (06/01 1028) Resp:  [15-18] 17 (06/01 1028) BP: (142-168)/(56-69) 166/62 (06/01 1058) SpO2:  [87 %-99 %] 94 % (06/01 1028) Weight:  [86.8 kg] 86.8 kg (06/01 0506)  Weight change: -3.325 kg Filed Weights   07/27/19 0559 07/27/19 0850 07/28/19 0506  Weight: 88.2 kg 84.9 kg 86.8 kg    Intake/Output:    Intake/Output Summary (Last 24 hours) at 07/28/2019 1122 Last data filed at 07/28/2019 1024 Gross per 24 hour  Intake 580 ml  Output 0 ml  Net 580 ml    Physical Exam: General:  No acute distress, laying in the bed  HEENT  anicteric, moist oral mucous membrane  Pulm/lungs  normal breathing effort, lungs are clear to auscultation  CVS/Heart  regular rhythm, no rub or gallop  Abdomen:   Soft, nontender  Extremities:  No peripheral edema  Neurologic:  Alert, oriented, able to follow commands  Skin:  No acute rashes  Left arm AV fistula   Basic Metabolic Panel:  Recent Labs  Lab 07/22/19 1554 07/23/19 1212 07/27/19 0949  NA 136 138  --   K 3.3* 4.4 3.2*  CL 96* 99  --   CO2 28 28  --   GLUCOSE 152* 134*  --   BUN 11 18  --   CREATININE 3.38* 5.18*  --   CALCIUM 7.8* 7.6*  --      CBC: Recent Labs  Lab 07/23/19 1212 07/24/19 0600 07/25/19 0426 07/26/19 0613 07/27/19 0350  WBC 6.0 7.3 6.1 6.6 6.5  NEUTROABS 4.3 5.3 4.2 4.6 4.8  HGB 8.5* 8.2* 7.6* 7.8* 8.6*  HCT 26.7* 25.4* 24.1* 24.5* 26.7*  MCV 95.0 94.1 95.6 93.5 94.3  PLT 205 188 173 189 184      Lab Results  Component Value Date   HEPBSAG NON REACTIVE 05/01/2019   HEPBSAB  Reactive (A) 05/05/2019      Microbiology:  Recent Results (from the past 240 hour(s))  SARS Coronavirus 2 by RT PCR (hospital order, performed in [place] hospital lab) Nasopharyngeal Nasopharyngeal Swab     Status: None   Collection Time: 07/22/19 10:17 PM   Specimen: Nasopharyngeal Swab  Result Value Ref Range Status   SARS Coronavirus 2 NEGATIVE NEGATIVE Final    Comment: (NOTE) SARS-CoV-2 target nucleic acids are NOT DETECTED. The SARS-CoV-2 RNA is generally detectable in upper and lower respiratory specimens during the acute phase of infection. The lowest concentration of SARS-CoV-2 viral copies this assay can detect is 250 copies / mL. A negative result does not preclude SARS-CoV-2 infection and should not be used as the sole basis for treatment or other patient management decisions.  A negative result may occur with improper specimen collection / handling, submission of specimen other than nasopharyngeal swab, presence of viral mutation(s) within the areas targeted by this assay, and inadequate number of viral copies (<250 copies / mL). A negative result must be combined with clinical observations, patient history, and epidemiological information. Fact Sheet for Patients:   StrictlyIdeas.no Fact Sheet for Healthcare Providers: BankingDealers.co.za This test is not yet approved or  cleared  by the [place] and has been authorized for detection and/or diagnosis of SARS-CoV-2 by FDA under an Emergency Use Authorization (EUA).  This EUA will remain in effect (meaning this test can be used) for the duration of the COVID-19 declaration under Section 564(b)(1) of the Act, 21 U.S.C. section 360bbb-3(b)(1), unless the authorization is terminated or revoked sooner. Performed at [facility], [address] [place], [place] [address]   MRSA PCR Screening     Status: None   Collection Time: 07/24/19  4:28 AM    Specimen: Nasopharyngeal  Result Value Ref Range Status   MRSA by PCR NEGATIVE NEGATIVE Final    Comment:        The GeneXpert MRSA Assay (FDA approved for NASAL specimens only), is one component of a comprehensive MRSA colonization surveillance program. It is not intended to diagnose MRSA infection nor to guide or monitor treatment for MRSA infections. Performed at [facility], [address] [place], [place] [address]     Coagulation Studies: No results for input(s): LABPROT, INR in the last 72 hours.  Urinalysis: No results for input(s): COLORURINE, LABSPEC, PHURINE, GLUCOSEU, HGBUR, BILIRUBINUR, KETONESUR, PROTEINUR, UROBILINOGEN, NITRITE, LEUKOCYTESUR in the last 72 hours.  Invalid input(s): APPERANCEUR    Imaging: No results found.   Medications:    . [MAR Hold] sodium chloride   Intravenous Once  . [MAR Hold] atorvastatin  80 mg Oral q1800  . [MAR Hold] calcium acetate  1,334 mg Oral TID WC  . [MAR Hold] Chlorhexidine Gluconate Cloth  6 each Topical Q0600  . [MAR Hold] insulin aspart  0-9 Units Subcutaneous Q4H  . [MAR Hold] levETIRAcetam  500 mg Oral BID  . [MAR Hold] levothyroxine  175 mcg Oral QAC breakfast  . [MAR Hold] metoprolol succinate  50 mg Oral Daily  . [MAR Hold] pantoprazole (PROTONIX) IV  40 mg Intravenous q1800  . [MAR Hold] sodium chloride flush  10 mL Intravenous Q12H   [MAR Hold] acetaminophen **OR** [MAR Hold] acetaminophen, [MAR Hold] doxylamine (Sleep)  Assessment/ Plan:  69 y.o. 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 Vicente Males.  Patient was on Plavix.  Push enteroscopy planned after Plavix is stopped for 3 days.  Also consideration of capsule study.  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 Underwent argon plasma coagulation of gastric and duodenal angiectasia today.  Appreciate gastroenterology assistance.  #End-stage renal disease Patient underwent dialysis treatment yesterday.  No acute indication for dialysis today.  We will plan for dialysis again tomorrow.  #Secondary hyperparathyroidism Lab Results  Component Value Date   PTH 91 (H) 03/13/2019   CALCIUM 7.6 (L) 07/23/2019   CAION 0.93 (L) 04/05/2019   PHOS 4.1 05/06/2019   Continue patient on current doses of calcium acetate and monitor bone mineral metabolism parameters.    LOS: 3 Merick Kelleher 6/1/202111:22 AM  [place]  [place], [place]  Note: This note was prepared with Dragon dictation. Any transcription errors are unintentional

## 2019-07-28 NOTE — Progress Notes (Signed)
Progress Note    Kyle [name]  ZHG:[id number] DOB: 16-Aug-1950  DOA: 07/22/2019 PCP: Idelle Crouch, MD      Brief Narrative:    Medical records reviewed and are as summarized below:  Kyle [name] is a 69 y.o. 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  He has had extensive work-up including upper and lower endoscopy, capsule endoscopy and push enteroscopy.  Work-up had revealed nonbleeding AVM that was treated with APC.  He returned to the emergency room on 07/22/2019 withintermittent melena stool that has been going on for several weeks.  He was referred from the outpatient dialysis center because hemoglobin was 5.7.   His hemoglobin was 6.7 in the emergency room.  He received a total of 3 units of packed red blood cells for acute blood loss anemia.  He was evaluated by the gastroenterologist who recommended patient enteroscopy for further evaluation.  Plavix had to be held before procedure can be safely performed.  He underwent push enteroscopy on 07/28/2019 and single nonbleeding angiectasia was found in the stomach and in the duodenum and these were treated with argon plasma coagulation.  Nodules and polyps seen were biopsied.  Follow-up with gastroenterologist for biopsy results as recommended.     Assessment/Plan:   Principal Problem:   Melena Active Problems:   Insulin dependent type 2 diabetes mellitus (HCC)   COPD with chronic bronchitis (HCC)   ESRD (end stage renal disease) (HCC)   PAD (peripheral artery disease) (HCC)   Diabetic polyneuropathy associated with type 2 diabetes mellitus ([place])   History of CVA (cerebrovascular accident)   Acute blood  loss anemia   ESRD (end stage renal disease) on dialysis ([place])   History of GI bleed   Acute GI bleeding   Melena Acute on chronic blood loss anemia   History of recurrent GI bleed Initial hemoglobin in the ED was 6.7. s/p transfusion with 2 units of PRBCs on 07/22/2019 and 1 unit of PRBCs on 07/26/2019. S/p push enteroscopy on 07/28/2019 and findings are as follows:  Widely patent Schatzki ring.                        - Mucosal nodule found in the esophagus. Biopsied.                        - A single non-bleeding angioectasia in the stomach.                         Treated with argon plasma coagulation (APC).                        - A single duodenal polyp. Biopsied.                        - Nodular mucosa in the duodenal bulb. Biopsied.                        - A single non-bleeding angioectasia in the duodenum.  Treated with argon plasma coagulation (APC).                        - Duodenal mass.                        - Jejunal polyp(s). Biopsied.                        - Jejunal mass. Biopsied.                        - The two submucosal lesions mentioned in the small                         bowel above could represent lipomas                        - No evidence of active or recent bleeding seen                         throughout the exam.  Case was discussed with gastroenterologist, Dr. Bonna Gains, who recommended that patient be observed overnight in the hospital prior to discharge.  She also recommended clear liquid diet and advancement of diet if there is no further bleeding.  She said Plavix can be resumed if there is no further bleeding.  -Patient had 2 recent admission for GI bleeding with EGD, colonoscopy, capsule endoscopy and push enteroscopy significant for single nonbleeding colonic angiodysplastic lesion status post monopolar probe treatment.     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  Continue statin PT  and OT recommend SNF at discharge  End-stage renal disease on hemodialysis Follow-up with nephrologist for hemodialysis.  Essential hypotension Continue antihypertensives  Diabetes mellitus type 2 with hyperlipidemia -Sliding scale insulin coverage Hemoglobin A1c 6.4  Hypothyroidism -Continue Synthroid  Seizure disorder Continue Keppra  Body mass index is 25.96 kg/m.   Family Communication/Anticipated D/C date and plan/Code Status   DVT prophylaxis: SCD Code Status: Full code Family Communication: Plan discussed with patient Disposition Plan:    Status is: Inpatient  Remains inpatient appropriate because:Unsafe d/c plan   Dispo: The patient is from: Home              Anticipated d/c is to: SNF              Anticipated d/c date is: 1 day              Patient currently is not medically stable to d/c.                 Subjective:   No bowel movement today.  No shortness of breath or chest pain.   Objective:    Vitals:   07/28/19 1302 07/28/19 1315 07/28/19 1330 07/28/19 1520  BP: (!) 172/66 (!) 171/81 (!) 169/78 (!) 187/70  Pulse: 62 72 67 65  Resp:    17  Temp:    97.8 F (36.6 C)  TempSrc:      SpO2: 98% 100%  98%  Weight:      Height:       No data found.   Intake/Output Summary (Last 24 hours) at 07/28/2019 1618 Last data filed at 07/28/2019 1345 Gross per 24 hour  Intake 700 ml  Output 0 ml  Net 700 ml   Filed Weights   07/27/19 0559 07/27/19 0850 07/28/19 0506  Weight: 88.2 kg 84.9 kg 86.8 kg    Exam:  GEN: No acute distress  SKIN: No rash EYES: No pallor or icterus  ENT: MMM CV: Regular rate and rhythm PULM: No wheezing or rales heard ABD: soft, ND, NT, +BS CNS: AAO x 3 EXT: No edema or tenderness   Data Reviewed:   I have personally reviewed following labs and imaging studies:  Labs: Labs show the following:   Basic Metabolic Panel: Recent Labs  Lab 07/22/19 1554 07/22/19 1554 07/23/19 1212  07/27/19 0949  NA 136  --  138  --   K 3.3*   < > 4.4 3.2*  CL 96*  --  99  --   CO2 28  --  28  --   GLUCOSE 152*  --  134*  --   BUN 11  --  18  --   CREATININE 3.38*  --  5.18*  --   CALCIUM 7.8*  --  7.6*  --    < > = values in this interval not displayed.   GFR Estimated Creatinine Clearance: 14.4 mL/min (A) (by C-G formula based on SCr of 5.18 mg/dL (H)). Liver Function Tests: Recent Labs  Lab 07/22/19 1554  AST 19  ALT 12  ALKPHOS 115  BILITOT 0.6  PROT 6.5  ALBUMIN 3.3*   No results for input(s): LIPASE, AMYLASE in the last 168 hours. No results for input(s): AMMONIA in the last 168 hours. Coagulation profile No results for input(s): INR, PROTIME in the last 168 hours.  CBC: Recent Labs  Lab 07/23/19 1212 07/24/19 0600 07/25/19 0426 07/26/19 0613 07/27/19 0350  WBC 6.0 7.3 6.1 6.6 6.5  NEUTROABS 4.3 5.3 4.2 4.6 4.8  HGB 8.5* 8.2* 7.6* 7.8* 8.6*  HCT 26.7* 25.4* 24.1* 24.5* 26.7*  MCV 95.0 94.1 95.6 93.5 94.3  PLT 205 188 173 189 184   Cardiac Enzymes: No results for input(s): CKTOTAL, CKMB, CKMBINDEX, TROPONINI in the last 168 hours. BNP (last 3 results) No results for input(s): PROBNP in the last 8760 hours. CBG: Recent Labs  Lab 07/28/19 0434 07/28/19 0742 07/28/19 0914 07/28/19 1151 07/28/19 1601  GLUCAP 147* 162* 153* 147* 176*   D-Dimer: No results for input(s): DDIMER in the last 72 hours. Hgb A1c: No results for input(s): HGBA1C in the last 72 hours. Lipid Profile: No results for input(s): CHOL, HDL, LDLCALC, TRIG, CHOLHDL, LDLDIRECT in the last 72 hours. Thyroid function studies: No results for input(s): TSH, T4TOTAL, T3FREE, THYROIDAB in the last 72 hours.  Invalid input(s): FREET3 Anemia work up: No results for input(s): VITAMINB12, FOLATE, FERRITIN, TIBC, IRON, RETICCTPCT in the last 72 hours. Sepsis Labs: Recent Labs  Lab 07/24/19 0600 07/25/19 0426 07/26/19 0613 07/27/19 0350  WBC 7.3 6.1 6.6 6.5     Microbiology Recent Results (from the past 240 hour(s))  SARS Coronavirus 2 by RT PCR (hospital order, performed in [facility] hospital lab) Nasopharyngeal Nasopharyngeal Swab     Status: None   Collection Time: 07/22/19 10:17 PM   Specimen: Nasopharyngeal Swab  Result Value Ref Range Status   SARS Coronavirus 2 NEGATIVE NEGATIVE Final    Comment: (NOTE) SARS-CoV-2 target nucleic acids are NOT DETECTED. The SARS-CoV-2 RNA is generally detectable in upper and lower respiratory specimens during the acute phase of infection. The lowest concentration of SARS-CoV-2 viral copies this assay can detect is 250 copies / mL. A  negative result does not preclude SARS-CoV-2 infection and should not be used as the sole basis for treatment or other patient management decisions.  A negative result may occur with improper specimen collection / handling, submission of specimen other than nasopharyngeal swab, presence of viral mutation(s) within the areas targeted by this assay, and inadequate number of viral copies (<250 copies / mL). A negative result must be combined with clinical observations, patient history, and epidemiological information. Fact Sheet for Patients:   StrictlyIdeas.no Fact Sheet for Healthcare Providers: BankingDealers.co.za This test is not yet approved or cleared  by the [place] FDA and has been authorized for detection and/or diagnosis of SARS-CoV-2 by FDA under an Emergency Use Authorization (EUA).  This EUA will remain in effect (meaning this test can be used) for the duration of the COVID-19 declaration under Section 564(b)(1) of the Act, 21 U.S.C. section 360bbb-3(b)(1), unless the authorization is terminated or revoked sooner. Performed at [facility], [address] [place], [place] [address]   MRSA PCR Screening     Status: None   Collection Time: 07/24/19  4:28 AM   Specimen: Nasopharyngeal   Result Value Ref Range Status   MRSA by PCR NEGATIVE NEGATIVE Final    Comment:        The GeneXpert MRSA Assay (FDA approved for NASAL specimens only), is one component of a comprehensive MRSA colonization surveillance program. It is not intended to diagnose MRSA infection nor to guide or monitor treatment for MRSA infections. Performed at [facility], [address] [place], [place] [address]     Procedures and diagnostic studies:  No results found.  Medications:   . sodium chloride   Intravenous Once  . atorvastatin  80 mg Oral q1800  . calcium acetate  1,334 mg Oral TID WC  . Chlorhexidine Gluconate Cloth  6 each Topical Q0600  . insulin aspart  0-9 Units Subcutaneous Q4H  . levETIRAcetam  500 mg Oral BID  . levothyroxine  175 mcg Oral QAC breakfast  . metoprolol succinate  50 mg Oral Daily  . pantoprazole (PROTONIX) IV  40 mg Intravenous q1800  . sodium chloride flush  10 mL Intravenous Q12H   Continuous Infusions:   LOS: 3 days   Draydon Clairmont  [facility]     07/28/2019, 4:18 PM

## 2019-07-29 ENCOUNTER — Encounter: Payer: Self-pay | Admitting: *Deleted

## 2019-07-29 DIAGNOSIS — K921 Melena: Secondary | ICD-10-CM | POA: Diagnosis not present

## 2019-07-29 DIAGNOSIS — K31811 Angiodysplasia of stomach and duodenum with bleeding: Secondary | ICD-10-CM | POA: Diagnosis not present

## 2019-07-29 DIAGNOSIS — D649 Anemia, unspecified: Secondary | ICD-10-CM | POA: Diagnosis not present

## 2019-07-29 LAB — SURGICAL PATHOLOGY

## 2019-07-29 LAB — SARS CORONAVIRUS 2 BY RT PCR (HOSPITAL ORDER, PERFORMED IN ~~LOC~~ HOSPITAL LAB): SARS Coronavirus 2: NEGATIVE

## 2019-07-29 LAB — GLUCOSE, CAPILLARY
Glucose-Capillary: 116 mg/dL — ABNORMAL HIGH (ref 70–99)
Glucose-Capillary: 133 mg/dL — ABNORMAL HIGH (ref 70–99)
Glucose-Capillary: 112 mg/dL — ABNORMAL HIGH (ref 70–99)
Glucose-Capillary: 169 mg/dL — ABNORMAL HIGH (ref 70–99)

## 2019-07-29 LAB — PHOSPHORUS: Phosphorus: 3.6 mg/dL (ref 2.5–4.6)

## 2019-07-29 MED ORDER — POLYETHYLENE GLYCOL 3350 17 GM/SCOOP PO POWD
17.0000 g | Freq: Every day | ORAL | Status: DC | PRN
  Filled 2019-07-29: qty 255

## 2019-07-29 MED ORDER — HYDROXYZINE HCL 25 MG PO TABS: 25.0000 mg | ORAL_TABLET | Freq: Four times a day (QID) | ORAL | Status: DC | PRN

## 2019-07-29 MED ORDER — LOSARTAN POTASSIUM 50 MG PO TABS: 100.0000 mg | ORAL_TABLET | Freq: Every day | ORAL | Status: DC

## 2019-07-29 MED ORDER — POLYETHYLENE GLYCOL 3350 17 G PO PACK: 17.0000 g | PACK | Freq: Every day | ORAL | Status: DC | PRN

## 2019-07-29 MED ORDER — RENA-VITE PO TABS
1.0000 | ORAL_TABLET | Freq: Every day | ORAL | Status: DC
  Filled 2019-07-29 (×2): qty 1

## 2019-07-29 MED ORDER — CLOPIDOGREL BISULFATE 75 MG PO TABS
75.0000 mg | ORAL_TABLET | Freq: Every day | ORAL | Status: DC
  Administered 2019-07-29: 75 mg via ORAL
  Filled 2019-07-29: qty 1

## 2019-07-29 MED ORDER — HYDRALAZINE HCL 20 MG/ML IJ SOLN
10.0000 mg | Freq: Four times a day (QID) | INTRAMUSCULAR | Status: DC | PRN
  Administered 2019-07-29: 10 mg via INTRAVENOUS
  Filled 2019-07-29: qty 1

## 2019-07-29 MED ORDER — INSULIN GLARGINE 100 UNIT/ML ~~LOC~~ SOLN
15.0000 [IU] | Freq: Every day | SUBCUTANEOUS | Status: DC
  Filled 2019-07-29: qty 0.15

## 2019-07-29 MED ORDER — ESCITALOPRAM OXALATE 10 MG PO TABS
10.0000 mg | ORAL_TABLET | Freq: Every day | ORAL | Status: DC
  Filled 2019-07-29 (×2): qty 1

## 2019-07-29 MED ORDER — LIDOCAINE-PRILOCAINE 2.5-2.5 % EX CREA
TOPICAL_CREAM | Freq: Once | CUTANEOUS | Status: DC
  Filled 2019-07-29: qty 5

## 2019-07-29 MED ORDER — PANTOPRAZOLE SODIUM 40 MG PO TBEC
40.0000 mg | DELAYED_RELEASE_TABLET | Freq: Two times a day (BID) | ORAL | Status: DC
  Administered 2019-07-29: 40 mg via ORAL
  Filled 2019-07-29: qty 1

## 2019-07-29 NOTE — Plan of Care (Signed)
  Problem: Education: Goal: Knowledge of General Education information will improve Description Including pain rating scale, medication(s)/side effects and non-pharmacologic comfort measures Outcome: Progressing   

## 2019-07-29 NOTE — Discharge Instructions (Signed)
The senior hemodialysis Monday Wednesday Friday as before.

## 2019-07-29 NOTE — Discharge Summary (Addendum)
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 Anemia   . Anxiety   . BPH (benign prostatic hyperplasia)   . Chronic kidney disease   . Colon polyps   . COPD (chronic obstructive pulmonary disease) ([place])   . Depression   . Diabetes mellitus without complication ([place])   . Diverticulosis of colon   . History of kidney stones   . Hyperlipidemia   . Hypertension   . Hypothyroidism   . Nephrolithiasis   . Seizures ([place])    2015 last seizure  . Stroke [facility])     HOSPITAL COURSE:  Melena Acuteon chronicblood loss anemia History of recurrentGI bleed Initial hemoglobin in the ED was 6.7. s/p transfusion with 2 units of PRBCs on 07/22/2019 and 1 unit of PRBCs on 07/26/2019. hbg 8.6 S/p push enteroscopy on 07/28/2019 and findings are as follows: Widely patent Schatzki ring. - Mucosal nodule found in the esophagus. Biopsied. - A single non-bleeding angioectasia in the stomach.  Treated with argon plasma coagulation (APC). - A single duodenal polyp. Biopsied. - Nodular mucosa in the duodenal bulb. Biopsied. - A single non-bleeding angioectasia in the duodenum.  Treated with argon plasma coagulation  (APC). - Duodenal mass. - Jejunal polyp(s). Biopsied. - Jejunal mass. Biopsied. - The two submucosal lesions mentioned in the small  bowel above could represent lipomas - No evidence of active or recent bleeding seen  throughout the exam.  Case was discussed with gastroenterologist, Dr. Bonna Gains, who recommended that patient be observed overnight in the hospital prior to discharge.  She also recommended clear liquid diet and advancement of diet if there is no further bleeding.  She said Plavix can be resumed if there is no further bleeding.pt reported brown BM this am. Will resume plavix  -Patient had2recent admission for GI bleeding with EGD, colonoscopy, capsule endoscopy and push enteroscopy significant forsingle nonbleeding colonic angiodysplastic lesion status post monopolar probe treatment.    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  Essentialhypotension Continue antihypertensives losartan, BB  Diabetes mellitus type 2with hyperlipidemia -Sliding scale insulin coverage -resume lantus Hemoglobin A1c 6.4  Hypothyroidism -Continue Synthroid  Seizure disorder Continue Keppra  Body mass index is 25.96 kg/m.   Family Communication/Anticipated D/C date and plan/Code Status   DVT prophylaxis: SCD Code Status: Full code Family Communication: Plan discussed with patient Disposition Plan: to [facility] today after HD   Status [id number](L/K UM)  Dispo: The patient is from: Home  Anticipated d/c is to: SNF  Anticipated d/c date is: today  Patient currently is   medically stable to d/c     CONSULTS OBTAINED:  Treatment Team:  Jonathon Bellows, MD  DRUG ALLERGIES:  No Known Allergies  DISCHARGE MEDICATIONS:   Allergies as of 07/29/2019   No Known Allergies  Medication List    TAKE these medications   atorvastatin 80 MG tablet Commonly known as: Lipitor Take 1 tablet (80 mg total) by mouth at bedtime.   calcium acetate 667 MG capsule Commonly known as: PHOSLO Take 2 capsules (1,334 mg total) by mouth 3 (three) times daily with meals. What changed: when to take this   clopidogrel 75 MG tablet Commonly known as: PLAVIX Take 75 mg by mouth daily.   escitalopram 10 MG tablet Commonly known as: LEXAPRO Take 10 mg by mouth daily.   hydrOXYzine 25 MG tablet Commonly known as: ATARAX/VISTARIL Take 25 mg by mouth every 6 (six) hours as needed for itching.   insulin glargine 100 UNIT/ML injection Commonly known as: LANTUS Inject 15 Units into the skin at bedtime.   levETIRAcetam 500 MG tablet Commonly known as: KEPPRA Take 500 mg by mouth 2 (two) times a day.   levothyroxine 200 MCG tablet Commonly known as: SYNTHROID Take 200 mcg by mouth daily before breakfast.   lidocaine-prilocaine cream Commonly known as: EMLA Apply 1 application topically Every Tuesday,Thursday,and Saturday with dialysis.   losartan 100 MG tablet Commonly known as: COZAAR Take 100 mg by mouth daily.   metoprolol succinate 50 MG 24 hr tablet Commonly known as: TOPROL-XL Take 1 tablet (50 mg total) by mouth daily. Take with or immediately following a meal.   NEPHRO-VITE PO Take 1 tablet by mouth daily.   Pancrelipase (Lip-Prot-Amyl) 24000-76000 units Cpep Take 1 capsule by mouth 3 (three) times daily with meals.   pantoprazole 40 MG tablet Commonly known as: PROTONIX Take 1 tablet (40 mg total) by mouth 2 (two) times daily.   polyethylene glycol powder 17 GM/SCOOP powder Commonly known as: GLYCOLAX/MIRALAX Take 17 g by mouth daily as  needed for moderate constipation.   potassium chloride SA 20 MEQ tablet Commonly known as: KLOR-CON Take 20 mEq by mouth 2 (two) times daily.   traZODone 100 MG tablet Commonly known as: DESYREL Take 100 mg by mouth at bedtime.       If you experience worsening of your admission symptoms, develop shortness of breath, life threatening emergency, suicidal or homicidal thoughts you must seek medical attention immediately by calling 911 or calling your MD immediately  if symptoms less severe.  You Must read complete instructions/literature along with all the possible adverse reactions/side effects for all the Medicines you take and that have been prescribed to you. Take any new Medicines after you have completely understood and accept all the possible adverse reactions/side effects.   Please note  You were cared for by a hospitalist during your hospital stay. If you have any questions about your discharge medications or the care you received while you were in the hospital after you are discharged, you can call the unit and asked to speak with the hospitalist on call if the hospitalist that took care of you is not available. Once you are discharged, your primary care physician will handle any further medical issues. Please note that NO REFILLS for any discharge medications will be authorized once you are discharged, as it is imperative that you return to your primary care physician (or establish a relationship with a primary care physician if you do not have one) for your aftercare needs so that they can reassess your need for medications and monitor your lab values. Today   SUBJECTIVE   No new complaints. HAd 2 brown regular BM's  VITAL SIGNS:  Blood pressure (!) 174/79, pulse 80, temperature 97.6 F (  36.4 C), temperature source Oral, resp. rate 17, height 6' (1.829 m), weight 86.9 kg, SpO2 95 %.  I/O:    Intake/Output Summary (Last 24 hours) at 07/29/2019 1017 Last data filed at 07/29/2019  0410 Gross per 24 hour  Intake 820 ml  Output 0 ml  Net 820 ml    PHYSICAL EXAMINATION:  GENERAL:  69 y.o.-year-old patient lying in the bed with no acute distress.  EYES: Pupils equal, round, reactive to light and accommodation. No scleral icterus.  HEENT: Head atraumatic, normocephalic. Oropharynx and nasopharynx clear.  NECK:  Supple, no jugular venous distention. No thyroid enlargement, no tenderness.  LUNGS: Normal breath sounds bilaterally, no wheezing, rales,rhonchi or crepitation. No use of accessory muscles of respiration.  CARDIOVASCULAR: S1, S2 normal. No murmurs, rubs, or gallops.  ABDOMEN: Soft, non-tender, non-distended. Bowel sounds present. No organomegaly or mass.  EXTREMITIES: No pedal edema, cyanosis, or clubbing. HD access+ NEUROLOGIC: mild right sided weakness-chronic from previous cvaPSYCHIATRIC: The patient is alert and oriented x 3.  SKIN: No obvious rash, lesion, or ulcer.   DATA REVIEW:   CBC  Recent Labs  Lab 07/27/19 0350  WBC 6.5  HGB 8.6*  HCT 26.7*  PLT 184    Chemistries  Recent Labs  Lab 07/22/19 1554 07/22/19 1554 07/23/19 1212 07/23/19 1212 07/27/19 0949  NA 136   < > 138  --   --   K 3.3*   < > 4.4   < > 3.2*  CL 96*   < > 99  --   --   CO2 28   < > 28  --   --   GLUCOSE 152*   < > 134*  --   --   BUN 11   < > 18  --   --   CREATININE 3.38*   < > 5.18*  --   --   CALCIUM 7.8*   < > 7.6*  --   --   AST 19  --   --   --   --   ALT 12  --   --   --   --   ALKPHOS 115  --   --   --   --   BILITOT 0.6  --   --   --   --    < > = values in this interval not displayed.    Microbiology Results   Recent Results (from the past 240 hour(s))  SARS Coronavirus 2 by RT PCR (hospital order, performed in [facility] hospital lab) Nasopharyngeal Nasopharyngeal Swab     Status: None   Collection Time: 07/22/19 10:17 PM   Specimen: Nasopharyngeal Swab  Result Value Ref Range Status   SARS Coronavirus 2 NEGATIVE NEGATIVE Final    Comment:  (NOTE) SARS-CoV-2 target nucleic acids are NOT DETECTED. The SARS-CoV-2 RNA is generally detectable in upper and lower respiratory specimens during the acute phase of infection. The lowest concentration of SARS-CoV-2 viral copies this assay can detect is 250 copies / mL. A negative result does not preclude SARS-CoV-2 infection and should not be used as the sole basis for treatment or other patient management decisions.  A negative result may occur with improper specimen collection / handling, submission of specimen other than nasopharyngeal swab, presence of viral mutation(s) within the areas targeted by this assay, and inadequate number of viral copies (<250 copies / mL). A negative result must be combined with clinical observations, patient history, and epidemiological information. Fact Sheet  for Patients:   StrictlyIdeas.no Fact Sheet for Healthcare Providers: BankingDealers.co.za This test is not yet approved or cleared  by the [place] FDA and has been authorized for detection and/or diagnosis of SARS-CoV-2 by FDA under an Emergency Use Authorization (EUA).  This EUA will remain in effect (meaning this test can be used) for the duration of the COVID-19 declaration under Section 564(b)(1) of the Act, 21 U.S.C. section 360bbb-3(b)(1), unless the authorization is terminated or revoked sooner. Performed at [facility], [address] [place], [place] [address]   MRSA PCR Screening     Status: None   Collection Time: 07/24/19  4:28 AM   Specimen: Nasopharyngeal  Result Value Ref Range Status   MRSA by PCR NEGATIVE NEGATIVE Final    Comment:        The GeneXpert MRSA Assay (FDA approved for NASAL specimens only), is one component of a comprehensive MRSA colonization surveillance program. It is not intended to diagnose MRSA infection nor to guide or monitor treatment for MRSA infections. Performed at [facility], [address], [place], [place] [address]     RADIOLOGY:  No results found.   CODE STATUS:     Code Status Orders  (From admission, onward)         Start     Ordered   07/22/19 2223  Full code  Continuous     07/22/19 2224        Code Status History    Date Active Date Inactive Code Status Order ID Comments User Context   04/29/2019 2145 05/07/2019 1705 Full Code [id number]  Lenore Cordia, MD ED   04/04/2019 1939 04/16/2019 2109 Full Code [id number]  Neena Rhymes, MD Inpatient   03/20/2019 1539 04/04/2019 1917 Full Code [id number]  Flora Lipps Inpatient   03/12/2019 2211 03/20/2019 1509 Full Code [id number]  Orene Desanctis, DO ED   09/16/2018 2349 09/18/2018 1913 Full Code [id number]  Mayer Camel, NP ED   Advance Care Planning Activity    Advance Directive Documentation     Most Recent Value  Type of Advance Directive  Healthcare Power of Attorney  Pre-existing out of facility DNR order (yellow form or pink MOST form)  --  "MOST" Form in Place?  --       TOTAL TIME TAKING CARE OF THIS PATIENT: *40* minutes.    Fritzi Mandes M.D  [facility]    CC: Primary care physician; Idelle Crouch, MD

## 2019-07-29 NOTE — Progress Notes (Signed)
Called report- Spoke to Nurse as [employer].   Paper work sent over with EMS transport.

## 2019-07-29 NOTE — Progress Notes (Addendum)
Patient returned to unit from dialysis .   BP was 173/69.  Paged MD.    MD ordered IV hydralazine Q 6hr PRN Gave ok to give 1 dose at this time.  Set parameter to be given if SBP is > 180 mmHg

## 2019-07-29 NOTE — Progress Notes (Signed)
HD started. 

## 2019-07-29 NOTE — Progress Notes (Signed)
PRE HD   

## 2019-07-29 NOTE — Care Management CC44 (Signed)
Condition Code 44 Documentation Completed  Patient Details  Name: Kyle [name] MRN: [id number] Date of Birth: [birth year]   Condition Code 44 given:  Yes Patient signature on Condition Code 44 notice:  Yes Documentation of 2 MD's agreement:  Yes Code 44 added to claim:  Yes    Victorino Dike, RN 07/29/2019, 8:33 AM

## 2019-07-29 NOTE — TOC Transition Note (Signed)
Transition of Care [facility]) - CM/SW Discharge Note   Patient Details  Name: Ferlin Fairhurst MRN: [id number] Date of Birth: [birth year]  Transition of Care [facility]) CM/SW Contact:  Victorino Dike, RN Phone Number: 07/29/2019, 4:16 PM       Final next level of care: [place] Barriers to Discharge: Barriers Resolved   Patient Goals and CMS Choice   CMS Medicare.gov Compare Post Acute Care list provided to:: Patient Choice offered to / list presented to : Patient, Spouse  Discharge Placement              Patient chooses bed at: [facility] Patient to be transferred to facility by: [facility] Name of family member notified: [name] wife Patient and family notified of of transfer: 07/29/19  Discharge Plan and Services   Discharge Planning Services: CM Consult Post Acute Care Choice: [place]                               Social Determinants of Health (SDOH) Interventions     Readmission Risk Interventions No flowsheet data found.

## 2019-07-29 NOTE — Care Management Obs Status (Signed)
[place] NOTIFICATION   Patient Details  Name: [name] MRN: [id number] Date of Birth: 30-May-1950   Medicare Observation Status Notification Given:  Yes    Victorino Dike, RN 07/29/2019, 8:33 AM

## 2019-08-20 ENCOUNTER — Other Ambulatory Visit: Payer: Self-pay

## 2019-08-20 ENCOUNTER — Encounter: Payer: Self-pay | Admitting: Emergency Medicine

## 2019-08-20 ENCOUNTER — Emergency Department
Admission: EM | Admit: 2019-08-20 | Discharge: 2019-08-20 | Disposition: A | Discharge status: Home / Self Care | Payer: Medicare PPO | Attending: Emergency Medicine | Admitting: Emergency Medicine

## 2019-08-20 DIAGNOSIS — I120 Hypertensive chronic kidney disease with stage 5 chronic kidney disease or end stage renal disease: Secondary | ICD-10-CM | POA: Insufficient documentation

## 2019-08-20 DIAGNOSIS — J449 Chronic obstructive pulmonary disease, unspecified: Secondary | ICD-10-CM | POA: Diagnosis not present

## 2019-08-20 DIAGNOSIS — F17210 Nicotine dependence, cigarettes, uncomplicated: Secondary | ICD-10-CM | POA: Insufficient documentation

## 2019-08-20 DIAGNOSIS — W19XXXA Unspecified fall, initial encounter: Secondary | ICD-10-CM

## 2019-08-20 DIAGNOSIS — E1122 Type 2 diabetes mellitus with diabetic chronic kidney disease: Secondary | ICD-10-CM | POA: Diagnosis not present

## 2019-08-20 DIAGNOSIS — E039 Hypothyroidism, unspecified: Secondary | ICD-10-CM | POA: Insufficient documentation

## 2019-08-20 DIAGNOSIS — Z794 Long term (current) use of insulin: Secondary | ICD-10-CM | POA: Insufficient documentation

## 2019-08-20 DIAGNOSIS — R42 Dizziness and giddiness: Secondary | ICD-10-CM | POA: Diagnosis not present

## 2019-08-20 DIAGNOSIS — N186 End stage renal disease: Secondary | ICD-10-CM | POA: Insufficient documentation

## 2019-08-20 DIAGNOSIS — Z992 Dependence on renal dialysis: Secondary | ICD-10-CM | POA: Diagnosis not present

## 2019-08-20 DIAGNOSIS — E1142 Type 2 diabetes mellitus with diabetic polyneuropathy: Secondary | ICD-10-CM | POA: Diagnosis not present

## 2019-08-20 DIAGNOSIS — Z79899 Other long term (current) drug therapy: Secondary | ICD-10-CM | POA: Diagnosis not present

## 2019-08-20 LAB — CBC
HCT: 30.8 % — ABNORMAL LOW (ref 39.0–52.0)
Hemoglobin: 9.8 g/dL — ABNORMAL LOW (ref 13.0–17.0)
MCH: 30.1 pg (ref 26.0–34.0)
MCHC: 31.8 g/dL (ref 30.0–36.0)
MCV: 94.5 fL (ref 80.0–100.0)
Platelets: 172 10*3/uL (ref 150–400)
RBC: 3.26 MIL/uL — ABNORMAL LOW (ref 4.22–5.81)
RDW: 14.5 % (ref 11.5–15.5)
WBC: 6 10*3/uL (ref 4.0–10.5)
nRBC: 0 % (ref 0.0–0.2)

## 2019-08-20 LAB — BASIC METABOLIC PANEL
Anion gap: 13 (ref 5–15)
BUN: 28 mg/dL — ABNORMAL HIGH (ref 8–23)
CO2: 27 mmol/L (ref 22–32)
Calcium: 8.1 mg/dL — ABNORMAL LOW (ref 8.9–10.3)
Chloride: 96 mmol/L — ABNORMAL LOW (ref 98–111)
Creatinine, Ser: 6.17 mg/dL — ABNORMAL HIGH (ref 0.61–1.24)
GFR calc Af Amer: 10 mL/min — ABNORMAL LOW (ref >60)
GFR calc non Af Amer: 8 mL/min — ABNORMAL LOW (ref >60)
Glucose, Bld: 301 mg/dL — ABNORMAL HIGH (ref 70–99)
Potassium: 3.9 mmol/L (ref 3.5–5.1)
Sodium: 136 mmol/L (ref 135–145)

## 2019-08-20 LAB — GLUCOSE, CAPILLARY: Glucose-Capillary: 253 mg/dL — ABNORMAL HIGH (ref 70–99)

## 2019-08-20 LAB — TROPONIN I (HIGH SENSITIVITY)
Troponin I (High Sensitivity): 17 ng/L (ref <18)
Troponin I (High Sensitivity): 16 ng/L (ref <18)

## 2019-08-20 MED ORDER — SODIUM CHLORIDE 0.9% FLUSH: 3.0000 mL | Freq: Once | INTRAVENOUS | Status: DC

## 2019-08-20 NOTE — ED Notes (Signed)
MD at bedside. 

## 2019-08-20 NOTE — ED Provider Notes (Signed)
[facility] Emergency Department Provider Note  ____________________________________________   I have reviewed the triage vital signs and the nursing notes.   HISTORY  Chief Complaint Dizziness and Bradycardia   History limited by: Not Limited   HPI Kyle [name] is a 68 y.o. male who presents to the emergency department today because of concerns for continued dizziness.  He states that he has had this dizziness since a stroke back in January.  Says it has been constant.  It has no  changed or gotten worse recently.  In addition the patient states that he has been having gait instability weakness with some right-sided lean that occurred after his stroke.  He says that he just left the living facility 2 days ago.  He did apparently have a fall yesterday again today. Denies any recent fever, chest pain, shortness of breath or GI bleeding.  Records reviewed. Per medical record review patient has a history of ESRD on dialysis. Recent admission for GI bleed.  Past Medical History:  Diagnosis Date  . Anemia   . Anxiety   . BPH (benign prostatic hyperplasia)   . Chronic kidney disease   . Colon polyps   . COPD (chronic obstructive pulmonary disease) ([place])   . Depression   . Diabetes mellitus without complication ([place])   . Diverticulosis of colon   . History of kidney stones   . Hyperlipidemia   . Hypertension   . Hypothyroidism   . Nephrolithiasis   . Seizures ([place])    2015 last seizure  . Stroke [facility])     Patient Active Problem List   Diagnosis Date Noted  . Acute GI bleeding 07/28/2019  . Melena 07/22/2019  . History of GI bleed 07/22/2019  . Current every day smoker 06/22/2019  . Falls frequently 06/22/2019  . Palliative care by specialist   . Goals of care, counseling/discussion   . Orthostasis 05/02/2019  . ESRD (end stage renal disease) on dialysis ([place])   . Acute blood loss anemia 04/30/2019  . Acute on chronic blood loss anemia  04/29/2019  . History of CVA (cerebrovascular accident) 04/29/2019  . GI bleed 04/04/2019  . 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[place]) 03/20/2019  . Generalized weakness   . Diabetic polyneuropathy associated with type 2 diabetes mellitus ([place])   . Cerebrovascular accident (CVA) ([place])   . PAD (peripheral artery disease) ([place]) 03/17/2019  . Ptosis of eyelid, left 03/15/2019  . Physical deconditioning 03/14/2019  . Hypomagnesemia 03/13/2019  . Hyperphosphatemia 03/13/2019  . ESRD (end stage renal disease) ([place]) 03/13/2019  . Chronic kidney disease with peritoneal dialysis as preferred modality, stage 5 ([place]) 03/13/2019  . Prolonged QT interval 03/13/2019  . Hypertension associated with diabetes ([place]) 03/13/2019  . Hypothyroidism 03/13/2019  . Chronic diarrhea 03/13/2019  . Hypocalcemia 03/12/2019  . Ataxia 03/10/2019  . Dizziness 03/10/2019  . Vision problem 03/10/2019  . Hypokalemia 09/16/2018  . ESRD on peritoneal dialysis ([place]) 03/04/2018  . Lung nodule 06/12/2016  . Narrowing of airway   . Cigarette smoker 05/24/2016  . Collapse of right lung 05/24/2016  . COPD with chronic bronchitis ([place]) 05/24/2016  . Diarrhea due to malabsorption 07/01/2015  . Hx of adenomatous colonic polyps 07/01/2015  . Acne 05/20/2015  . Calculus of kidney 05/20/2015  . Chest pain, non-cardiac 05/20/2015  . Seizure disorder ([place]) 05/20/2015  . Current tobacco use 05/20/2015  . Chronic kidney disease (CKD), stage III (moderate) 03/29/2015  . Insulin dependent type 2 diabetes mellitus ([place]) 03/15/2015  .  Lipoma of shoulder 03/08/2014  . Other synovitis and tenosynovitis, right shoulder 03/08/2014  . Right supraspinatus tenosynovitis 03/08/2014  . Bursitis of elbow 02/15/2014  . Olecranon bursitis of right elbow 02/15/2014  . Absolute anemia 08/08/2013  . 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 Back pain, chronic 08/08/2013  . Chronic obstructive pulmonary disease ([place]) 08/08/2013  . BP (high blood pressure)  08/08/2013  . Hyperlipidemia associated with type 2 diabetes mellitus ([place]) 08/08/2013  . Acne erythematosa 08/08/2013    Past Surgical History:  Procedure Laterality Date  . AV FISTULA PLACEMENT Left 03/30/2019   Procedure: right brachiocephalic fistula creation;  Surgeon: Marty Heck, MD;  Location: [place];  Service: Vascular;  Laterality: Left;  . BIOPSY  04/05/2019   Procedure: BIOPSY;  Surgeon: Wilford Corner, MD;  Location: [place];  Service: Endoscopy;;  . COLONOSCOPY    . COLONOSCOPY WITH PROPOFOL N/A 05/30/2015   Procedure: COLONOSCOPY WITH PROPOFOL;  Surgeon: Manya Silvas, MD;  Location: [facility] ENDOSCOPY;  Service: Endoscopy;  Laterality: N/A;  . COLONOSCOPY WITH PROPOFOL N/A 12/01/2018   Procedure: COLONOSCOPY WITH PROPOFOL;  Surgeon: Tol[place], [name], MD;  Location: [facility] ENDOSCOPY;  Service: Gastroenterology;  Laterality: N/A;  . COLONOSCOPY WITH PROPOFOL N/A 04/09/2019   Procedure: COLONOSCOPY WITH PROPOFOL;  Surgeon: Carol Ada, MD;  Location: [place];  Service: Endoscopy;  Laterality: N/A;  . ENTEROSCOPY N/A 05/05/2019   Procedure: ENTEROSCOPY;  Surgeon: Ronnette Juniper, MD;  Location: [facility] ENDOSCOPY;  Service: Gastroenterology;  Laterality: N/A;  Push enteroscopy  . ENTEROSCOPY N/A 07/28/2019   Procedure: ENTEROSCOPY;  Surgeon: Virgel Manifold, MD;  Location[facility] ENDOSCOPY;  Service: Endoscopy;  Laterality: N/A;  . ESOPHAGOGASTRODUODENOSCOPY (EGD) WITH PROPOFOL N/A 04/05/2019   Procedure: ESOPHAGOGASTRODUODENOSCOPY (EGD) WITH PROPOFOL;  Surgeon: Wilford Corner, MD;  Location: [place];  Service: Endoscopy;  Laterality: N/A;  . GIVENS CAPSULE STUDY N/A 05/01/2019   Procedure: GIVENS CAPSULE STUDY;  Surgeon: Ronald Lobo, MD;  Location: [place];  Service: Endoscopy;  Laterality: N/A;  . HOT HEMOSTASIS N/A 04/09/2019   Procedure: HOT HEMOSTASIS (ARGON PLASMA COAGULATION/BICAP);  Surgeon: Carol Ada, MD;  Location: [place];  Service: Endoscopy;   Laterality: N/A;  . HOT HEMOSTASIS N/A 05/05/2019   Procedure: HOT HEMOSTASIS (ARGON PLASMA COAGULATION/BICAP);  Surgeon: Ronnette Juniper, MD;  Location: [place];  Service: Gastroenterology;  Laterality: N/A;  . INSERTION OF DIALYSIS CATHETER Right 03/30/2019   Procedure: Ultrasound guided right internal jugular tunneled dialysis catheter placement;  Surgeon: Marty Heck, MD;  Location[facility];  Service: Vascular;  Laterality: Right;  . kidney stone    . REMOVAL OF A DIALYSIS CATHETER Right 06/26/2019   Procedure: REMOVAL OF A DIALYSIS CATHETER ( PD CATH REMOVAL AND I J CATH REMOVAL;  Surgeon: Katha Cabal, MD;  Location: [facility] ORS;  Service: Vascular;  Laterality: Right;  . THYROIDECTOMY    . VARICOCELE EXCISION    . VIDEO BRONCHOSCOPY Bilateral 05/25/2016   Procedure: VIDEO BRONCHOSCOPY WITHOUT FLUORO;  Surgeon: Juanito Doom, MD;  Location[facility] ENDOSCOPY;  Service: Cardiopulmonary;  Laterality: Bilateral;    Prior to Admission medications   Medication Sig Start Date End Date Taking? Authorizing Provider  atorvastatin (LIPITOR) 80 MG tablet Take 1 tablet (80 mg total) by mouth at bedtime. 05/07/19 07/22/19  Aline August, MD  B Complex-C-Folic Acid (NEPHRO-VITE PO) Take 1 tablet by mouth daily.    [provider]  calcium acetate (PHOSLO) 667 MG capsule Take 2 capsules (1,334 mg total) by  mouth 3 (three) times daily with meals. Patient taking differently: Take 1,334 mg by mouth in the morning and at bedtime.  03/20/19   Loletha Grayer, MD  clopidogrel (PLAVIX) 75 MG tablet Take 75 mg by mouth daily.    [provider]  escitalopram (LEXAPRO) 10 MG tablet Take 10 mg by mouth daily. 07/14/19   [provider]  hydrOXYzine (ATARAX/VISTARIL) 25 MG tablet Take 25 mg by mouth every 6 (six) hours as needed for itching.    [provider]  insulin glargine (LANTUS) 100 UNIT/ML injection Inject 15 Units into the skin at bedtime.     [provider]  levETIRAcetam (KEPPRA) 500 MG tablet Take 500 mg by mouth 2 (two) times a day. 09/05/15   [provider]  levothyroxine (SYNTHROID) 200 MCG tablet Take 200 mcg by mouth daily before breakfast.  02/25/19   [provider]  lidocaine-prilocaine (EMLA) cream Apply 1 application topically Every Tuesday,Thursday,and Saturday with dialysis.     [provider]  losartan (COZAAR) 100 MG tablet Take 100 mg by mouth daily. 07/14/19   [provider]  metoprolol succinate (TOPROL-XL) 50 MG 24 hr tablet Take 1 tablet (50 mg total) by mouth daily. Take with or immediately following a meal. Patient not taking: Reported on 07/22/2019 04/17/19 06/25/19  Kayleen Memos, DO  Pancrelipase, Lip-Prot-Amyl, 24000-76000 units CPEP Take 1 capsule by mouth 3 (three) times daily with meals.     [provider]  pantoprazole (PROTONIX) 40 MG tablet Take 1 tablet (40 mg total) by mouth 2 (two) times daily. 04/16/19 07/22/19  Kayleen Memos, DO  polyethylene glycol powder (GLYCOLAX/MIRALAX) 17 GM/SCOOP powder Take 17 g by mouth daily as needed for moderate constipation.     [provider]  potassium chloride SA (KLOR-CON) 20 MEQ tablet Take 20 mEq by mouth 2 (two) times daily.    [provider]  traZODone (DESYREL) 100 MG tablet Take 100 mg by mouth at bedtime. 07/14/19   [provider]    Allergies Patient has no known allergies.  Family History  Problem Relation Age of Onset  . Diabetes Mother   . Diabetes Maternal Grandmother   . Diabetes Maternal Grandfather   . Lung cancer Father   . Emphysema Paternal Grandfather     Social History Social History   Tobacco Use  . Smoking status: Light Tobacco Smoker    Packs/day: 0.25    Years: 40.00    Pack years: 10.00    Types: Cigarettes    Last attempt to quit: 05/25/2016    Years since quitting: 3.2  . Smokeless tobacco: Never Used  Vaping Use  . Vaping Use: Never used  Substance Use  Topics  . Alcohol use: Yes    Alcohol/week: 8.0 standard drinks    Types: 8 Cans of beer per week    Comment: beer or wine   . Drug use: No    Review of Systems Constitutional: No fever/chills Eyes: No visual changes. ENT: No sore throat. Cardiovascular: Denies chest pain. Respiratory: Denies shortness of breath. Gastrointestinal: No abdominal pain.  No nausea, no vomiting.  No diarrhea.   Genitourinary: Negative for dysuria. Musculoskeletal: Negative for back pain. Skin: Negative for rash. Neurological: Positive for dizziness. Right sided lean. ____________________________________________   PHYSICAL EXAM:  VITAL SIGNS: ED Triage Vitals [08/20/19 1257]  Enc Vitals Group     BP (!) 147/75     Pulse Rate (!) 52  Resp 20     Temp 97.6 F (36.4 C)     Temp Source Oral     SpO2 99 %     Weight 183 lb (83 kg)     Height 6' (1.829 m)     Head Circumference      Peak Flow      Pain Score 0   Constitutional: Alert and oriented.  Eyes: Conjunctivae are normal.  ENT      Head: Normocephalic and atraumatic.      Nose: No congestion/rhinnorhea.      Mouth/Throat: Mucous membranes are moist.      Neck: No stridor. Hematological/Lymphatic/Immunilogical: No cervical lymphadenopathy. Cardiovascular: Normal rate, regular rhythm.  No murmurs, rubs, or gallops. Respiratory: Normal respiratory effort without tachypnea nor retractions. Breath sounds are clear and equal bilaterally. No wheezes/rales/rhonchi. Gastrointestinal: Soft and non tender. No rebound. No guarding.  Genitourinary: Deferred Musculoskeletal: Normal range of motion in all extremities. No lower extremity edema. Neurologic:  Normal speech and language. No gross focal neurologic deficits are appreciated.  Skin:  Skin is warm, dry and intact. No rash noted. Psychiatric: Mood and affect are normal. Speech and behavior are normal. Patient exhibits appropriate insight and  judgment.  ____________________________________________    LABS (pertinent positives/negatives)  Trop hs 16 CBC wbc 6.0, hgb 9.8, plt 172 BMP na 136, k 3.9, glu 301, cr 6.17  ____________________________________________   EKG  I, Nance Pear, attending physician, personally viewed and interpreted this EKG  EKG Time: 1259 Rate: 53 Rhythm: sinus bradycardia Axis: left axis deviation Intervals: qtc 516 QRS: RBBB, LAFB ST changes: no st elevation Impression: abnormal ekg  ____________________________________________    RADIOLOGY  None  ____________________________________________   PROCEDURES  Procedures  ____________________________________________   INITIAL IMPRESSION / ASSESSMENT AND PLAN / ED COURSE  Pertinent labs & imaging results that were available during my care of the patient were reviewed by me and considered in my medical decision making (see chart for details).   Patient presented to the emergency department today because of concerns for continued dizziness and gait instability.  The symptoms have been going on since January.  Talking to the patient does not sound like anything had changed recently.  Patient denies noticing any recent GI bleed.  I did offer to perform a rectal exam however patient declined.  I did have a long discussion on the phone with the patient's wife with the patient's permission.  She has significant concerns for the patient's safety at home.  She is curious about getting patient placed back in a living facility again.  I did discuss with the patient that we could keep him here overnight and try to find placement.  Patient however was adamant that he wanted to go home.  I did discuss and share both my and the wife's concerns about possible safety hazard about going home.  Did discuss with patient that he might benefit from continued state at living facility.  Patient however was adamant that he be discharged home.  The patient then  talked directly to his wife.  He continued decided to go home.  Will put in face-to-face and home health orders.  ____________________________________________   FINAL CLINICAL IMPRESSION(S) / ED DIAGNOSES  Final diagnoses:  Dizziness  Fall, initial encounter     Note: This dictation was prepared with Dragon dictation. Any transcriptional errors that result from this process are unintentional     Nance Pear, MD 08/20/19 [phone]

## 2019-08-20 NOTE — ED Triage Notes (Signed)
Pt presents to ED via [facility] with c/o dizziness that has been increasing since January. Per EMS pt is a dialysis patient and had a full treatment yesterday. EMS reports pt had a fall today, denies any c/o from fall. Per EMS EKG showed EKG had RBB, unknown if new RBB. Pt reports had a stroke on January 22, pt reports since having had his stroke he has been dizzy.    50's RBB 146/60 100% RA

## 2019-08-20 NOTE — Discharge Instructions (Signed)
Please seek medical attention for any high fevers, chest pain, shortness of breath, change in behavior, persistent vomiting, bloody stool or any other new or concerning symptoms.  

## 2019-08-20 NOTE — ED Notes (Signed)
MD Archie Balboa updating patient family with patient permission.

## 2019-08-26 ENCOUNTER — Telehealth: Payer: Self-pay | Admitting: Gastroenterology

## 2019-08-26 NOTE — Telephone Encounter (Signed)
I tried calling patient's h# out of service & c# v/m full. I reached spouce & she will call back ro set up appt. Patient needs to be seen soon for GI bleeding w/ Dr. Bonna Gains. Can you please schedule appt. okay to double book

## 2019-08-27 ENCOUNTER — Telehealth: Payer: Self-pay | Admitting: Gastroenterology

## 2019-08-27 NOTE — Telephone Encounter (Signed)
2x Called wife's [phone] to schedule appt. For patient..VM is full and you can't leave a message.

## 2019-08-27 NOTE — Telephone Encounter (Signed)
Patient was contacted again and he did not answer. Voicemail was left. I will also send him a letter since they are not returning our calls.

## 2019-08-27 NOTE — Telephone Encounter (Signed)
Called patient to schedule an F/U appt. With [facility] per Herb Grays. Mobile #VM was full and can't leave a message..Home # Not in service..Found Wife's # [phone] and she wants the office to call back this afternoon. CMA was informed.

## 2019-09-10 ENCOUNTER — Ambulatory Visit (INDEPENDENT_AMBULATORY_CARE_PROVIDER_SITE_OTHER): Payer: Medicare PPO | Admitting: Nurse Practitioner

## 2019-11-11 ENCOUNTER — Encounter: Payer: Self-pay | Admitting: Gastroenterology

## 2020-01-25 ENCOUNTER — Emergency Department: Payer: Medicare PPO

## 2020-01-25 ENCOUNTER — Inpatient Hospital Stay
Admission: EM | Admit: 2020-01-25 | Discharge: 2020-01-28 | DRG: 100 | Disposition: A | Discharge status: Skilled Nursing Facility | Payer: Medicare PPO | Attending: Internal Medicine | Admitting: Internal Medicine

## 2020-01-25 ENCOUNTER — Encounter: Payer: Self-pay | Admitting: Emergency Medicine

## 2020-01-25 ENCOUNTER — Other Ambulatory Visit: Payer: Self-pay

## 2020-01-25 DIAGNOSIS — E039 Hypothyroidism, unspecified: Secondary | ICD-10-CM | POA: Diagnosis present

## 2020-01-25 DIAGNOSIS — Z794 Long term (current) use of insulin: Secondary | ICD-10-CM

## 2020-01-25 DIAGNOSIS — I4510 Unspecified right bundle-branch block: Secondary | ICD-10-CM | POA: Diagnosis present

## 2020-01-25 DIAGNOSIS — E860 Dehydration: Secondary | ICD-10-CM | POA: Diagnosis present

## 2020-01-25 DIAGNOSIS — F17210 Nicotine dependence, cigarettes, uncomplicated: Secondary | ICD-10-CM | POA: Diagnosis present

## 2020-01-25 DIAGNOSIS — Z992 Dependence on renal dialysis: Secondary | ICD-10-CM

## 2020-01-25 DIAGNOSIS — Z801 Family history of malignant neoplasm of trachea, bronchus and lung: Secondary | ICD-10-CM

## 2020-01-25 DIAGNOSIS — Z825 Family history of asthma and other chronic lower respiratory diseases: Secondary | ICD-10-CM

## 2020-01-25 DIAGNOSIS — E871 Hypo-osmolality and hyponatremia: Secondary | ICD-10-CM | POA: Diagnosis present

## 2020-01-25 DIAGNOSIS — Z87442 Personal history of urinary calculi: Secondary | ICD-10-CM

## 2020-01-25 DIAGNOSIS — Z79899 Other long term (current) drug therapy: Secondary | ICD-10-CM | POA: Diagnosis not present

## 2020-01-25 DIAGNOSIS — G40909 Epilepsy, unspecified, not intractable, without status epilepticus: Principal | ICD-10-CM | POA: Diagnosis present

## 2020-01-25 DIAGNOSIS — E875 Hyperkalemia: Secondary | ICD-10-CM | POA: Diagnosis present

## 2020-01-25 DIAGNOSIS — D631 Anemia in chronic kidney disease: Secondary | ICD-10-CM | POA: Diagnosis present

## 2020-01-25 DIAGNOSIS — S01512A Laceration without foreign body of oral cavity, initial encounter: Secondary | ICD-10-CM | POA: Diagnosis present

## 2020-01-25 DIAGNOSIS — N186 End stage renal disease: Secondary | ICD-10-CM | POA: Diagnosis present

## 2020-01-25 DIAGNOSIS — F32A Depression, unspecified: Secondary | ICD-10-CM | POA: Diagnosis present

## 2020-01-25 DIAGNOSIS — E1142 Type 2 diabetes mellitus with diabetic polyneuropathy: Secondary | ICD-10-CM | POA: Diagnosis present

## 2020-01-25 DIAGNOSIS — W19XXXA Unspecified fall, initial encounter: Secondary | ICD-10-CM | POA: Diagnosis present

## 2020-01-25 DIAGNOSIS — E1122 Type 2 diabetes mellitus with diabetic chronic kidney disease: Secondary | ICD-10-CM | POA: Diagnosis present

## 2020-01-25 DIAGNOSIS — Z79890 Hormone replacement therapy (postmenopausal): Secondary | ICD-10-CM | POA: Diagnosis not present

## 2020-01-25 DIAGNOSIS — I639 Cerebral infarction, unspecified: Secondary | ICD-10-CM | POA: Diagnosis not present

## 2020-01-25 DIAGNOSIS — Z86010 Personal history of colonic polyps: Secondary | ICD-10-CM

## 2020-01-25 DIAGNOSIS — Z833 Family history of diabetes mellitus: Secondary | ICD-10-CM

## 2020-01-25 DIAGNOSIS — N2581 Secondary hyperparathyroidism of renal origin: Secondary | ICD-10-CM | POA: Diagnosis present

## 2020-01-25 DIAGNOSIS — N400 Enlarged prostate without lower urinary tract symptoms: Secondary | ICD-10-CM | POA: Diagnosis present

## 2020-01-25 DIAGNOSIS — Z7902 Long term (current) use of antithrombotics/antiplatelets: Secondary | ICD-10-CM | POA: Diagnosis not present

## 2020-01-25 DIAGNOSIS — I69354 Hemiplegia and hemiparesis following cerebral infarction affecting left non-dominant side: Secondary | ICD-10-CM

## 2020-01-25 DIAGNOSIS — R569 Unspecified convulsions: Secondary | ICD-10-CM

## 2020-01-25 DIAGNOSIS — R778 Other specified abnormalities of plasma proteins: Secondary | ICD-10-CM

## 2020-01-25 DIAGNOSIS — R9431 Abnormal electrocardiogram [ECG] [EKG]: Secondary | ICD-10-CM

## 2020-01-25 DIAGNOSIS — E1165 Type 2 diabetes mellitus with hyperglycemia: Secondary | ICD-10-CM | POA: Diagnosis present

## 2020-01-25 DIAGNOSIS — R1313 Dysphagia, pharyngeal phase: Secondary | ICD-10-CM | POA: Diagnosis present

## 2020-01-25 DIAGNOSIS — Y92009 Unspecified place in unspecified non-institutional (private) residence as the place of occurrence of the external cause: Secondary | ICD-10-CM

## 2020-01-25 DIAGNOSIS — R739 Hyperglycemia, unspecified: Secondary | ICD-10-CM

## 2020-01-25 DIAGNOSIS — Z20822 Contact with and (suspected) exposure to covid-19: Secondary | ICD-10-CM | POA: Diagnosis present

## 2020-01-25 DIAGNOSIS — E785 Hyperlipidemia, unspecified: Secondary | ICD-10-CM | POA: Diagnosis present

## 2020-01-25 DIAGNOSIS — I120 Hypertensive chronic kidney disease with stage 5 chronic kidney disease or end stage renal disease: Secondary | ICD-10-CM | POA: Diagnosis present

## 2020-01-25 DIAGNOSIS — E1151 Type 2 diabetes mellitus with diabetic peripheral angiopathy without gangrene: Secondary | ICD-10-CM | POA: Diagnosis present

## 2020-01-25 DIAGNOSIS — J320 Chronic maxillary sinusitis: Secondary | ICD-10-CM | POA: Diagnosis present

## 2020-01-25 DIAGNOSIS — K529 Noninfective gastroenteritis and colitis, unspecified: Secondary | ICD-10-CM | POA: Diagnosis present

## 2020-01-25 DIAGNOSIS — J449 Chronic obstructive pulmonary disease, unspecified: Secondary | ICD-10-CM | POA: Diagnosis present

## 2020-01-25 LAB — COMPREHENSIVE METABOLIC PANEL
ALT: 7 U/L (ref 0–44)
AST: 16 U/L (ref 15–41)
Albumin: 3.5 g/dL (ref 3.5–5.0)
Alkaline Phosphatase: 93 U/L (ref 38–126)
Anion gap: 16 — ABNORMAL HIGH (ref 5–15)
BUN: 43 mg/dL — ABNORMAL HIGH (ref 8–23)
CO2: 24 mmol/L (ref 22–32)
Calcium: 7.9 mg/dL — ABNORMAL LOW (ref 8.9–10.3)
Chloride: 91 mmol/L — ABNORMAL LOW (ref 98–111)
Creatinine, Ser: 7.7 mg/dL — ABNORMAL HIGH (ref 0.61–1.24)
GFR, Estimated: 7 mL/min — ABNORMAL LOW (ref >60)
Glucose, Bld: 235 mg/dL — ABNORMAL HIGH (ref 70–99)
Potassium: 6 mmol/L — ABNORMAL HIGH (ref 3.5–5.1)
Sodium: 131 mmol/L — ABNORMAL LOW (ref 135–145)
Total Bilirubin: 1.2 mg/dL (ref 0.3–1.2)
Total Protein: 6.8 g/dL (ref 6.5–8.1)

## 2020-01-25 LAB — CBC WITH DIFFERENTIAL/PLATELET
Abs Immature Granulocytes: 0.11 K/uL — ABNORMAL HIGH (ref 0.00–0.07)
Basophils Absolute: 0.1 K/uL (ref 0.0–0.1)
Basophils Relative: 2 %
Eosinophils Absolute: 0.1 K/uL (ref 0.0–0.5)
Eosinophils Relative: 2 %
HCT: 37.4 % — ABNORMAL LOW (ref 39.0–52.0)
Hemoglobin: 11.7 g/dL — ABNORMAL LOW (ref 13.0–17.0)
Immature Granulocytes: 1 %
Lymphocytes Relative: 11 %
Lymphs Abs: 0.8 K/uL (ref 0.7–4.0)
MCH: 31 pg (ref 26.0–34.0)
MCHC: 31.3 g/dL (ref 30.0–36.0)
MCV: 99.2 fL (ref 80.0–100.0)
Monocytes Absolute: 0.4 K/uL (ref 0.1–1.0)
Monocytes Relative: 6 %
Neutro Abs: 6.2 K/uL (ref 1.7–7.7)
Neutrophils Relative %: 78 %
Platelets: 170 K/uL (ref 150–400)
RBC: 3.77 MIL/uL — ABNORMAL LOW (ref 4.22–5.81)
RDW: 17.2 % — ABNORMAL HIGH (ref 11.5–15.5)
WBC: 7.8 K/uL (ref 4.0–10.5)
nRBC: 0 % (ref 0.0–0.2)

## 2020-01-25 LAB — RESP PANEL BY RT-PCR (FLU A&B, COVID) ARPGX2
Influenza A by PCR: NEGATIVE
Influenza B by PCR: NEGATIVE
SARS Coronavirus 2 by RT PCR: NEGATIVE

## 2020-01-25 LAB — CBG MONITORING, ED: Glucose-Capillary: 225 mg/dL — ABNORMAL HIGH (ref 70–99)

## 2020-01-25 LAB — MAGNESIUM: Magnesium: 1.4 mg/dL — ABNORMAL LOW (ref 1.7–2.4)

## 2020-01-25 LAB — BRAIN NATRIURETIC PEPTIDE: B Natriuretic Peptide: 2916.1 pg/mL — ABNORMAL HIGH (ref 0.0–100.0)

## 2020-01-25 LAB — GLUCOSE, CAPILLARY
Glucose-Capillary: 149 mg/dL — ABNORMAL HIGH (ref 70–99)
Glucose-Capillary: 74 mg/dL (ref 70–99)

## 2020-01-25 LAB — TROPONIN I (HIGH SENSITIVITY): Troponin I (High Sensitivity): 29 ng/L — ABNORMAL HIGH (ref <18)

## 2020-01-25 MED ORDER — LEVOTHYROXINE SODIUM 100 MCG PO TABS
200.0000 ug | ORAL_TABLET | Freq: Every day | ORAL | Status: DC
  Administered 2020-01-26 – 2020-01-28 (×3): 200 ug via ORAL
  Filled 2020-01-25 (×3): qty 2

## 2020-01-25 MED ORDER — MAGNESIUM SULFATE 2 GM/50ML IV SOLN
2.0000 g | Freq: Once | INTRAVENOUS | Status: AC
  Administered 2020-01-25: 2 g via INTRAVENOUS
  Filled 2020-01-25: qty 50

## 2020-01-25 MED ORDER — POLYETHYLENE GLYCOL 3350 17 GM/SCOOP PO POWD
17.0000 g | Freq: Every day | ORAL | Status: DC | PRN
  Filled 2020-01-25: qty 255

## 2020-01-25 MED ORDER — ASPIRIN 81 MG PO CHEW: 324.0000 mg | CHEWABLE_TABLET | Freq: Once | ORAL | Status: DC

## 2020-01-25 MED ORDER — INSULIN ASPART 100 UNIT/ML ~~LOC~~ SOLN
0.0000 [IU] | SUBCUTANEOUS | Status: DC
  Administered 2020-01-25: 5 [IU] via SUBCUTANEOUS
  Filled 2020-01-25: qty 1

## 2020-01-25 MED ORDER — SODIUM CHLORIDE 0.9 % IV SOLN
100.0000 mg | Freq: Two times a day (BID) | INTRAVENOUS | Status: AC
  Administered 2020-01-25 – 2020-01-27 (×5): 100 mg via INTRAVENOUS
  Filled 2020-01-25 (×6): qty 10

## 2020-01-25 MED ORDER — LOSARTAN POTASSIUM 50 MG PO TABS
100.0000 mg | ORAL_TABLET | Freq: Every day | ORAL | Status: DC
  Administered 2020-01-25 – 2020-01-28 (×4): 100 mg via ORAL
  Filled 2020-01-25 (×4): qty 2

## 2020-01-25 MED ORDER — GABAPENTIN 100 MG PO CAPS
100.0000 mg | ORAL_CAPSULE | Freq: Two times a day (BID) | ORAL | Status: DC
  Administered 2020-01-25 – 2020-01-28 (×6): 100 mg via ORAL
  Filled 2020-01-25 (×6): qty 1

## 2020-01-25 MED ORDER — SODIUM CHLORIDE 0.9% FLUSH
3.0000 mL | Freq: Two times a day (BID) | INTRAVENOUS | Status: DC
  Administered 2020-01-25 – 2020-01-28 (×7): 3 mL via INTRAVENOUS

## 2020-01-25 MED ORDER — SODIUM CHLORIDE 0.9% FLUSH: 3.0000 mL | INTRAVENOUS | Status: DC | PRN

## 2020-01-25 MED ORDER — CLOPIDOGREL BISULFATE 75 MG PO TABS
75.0000 mg | ORAL_TABLET | Freq: Every day | ORAL | Status: DC
  Administered 2020-01-25 – 2020-01-28 (×3): 75 mg via ORAL
  Filled 2020-01-25 (×4): qty 1

## 2020-01-25 MED ORDER — LEVETIRACETAM 500 MG PO TABS: 500.0000 mg | ORAL_TABLET | Freq: Two times a day (BID) | ORAL | Status: DC

## 2020-01-25 MED ORDER — TRAZODONE HCL 50 MG PO TABS
100.0000 mg | ORAL_TABLET | Freq: Every day | ORAL | Status: DC
  Administered 2020-01-25 – 2020-01-27 (×3): 100 mg via ORAL
  Filled 2020-01-25 (×3): qty 2

## 2020-01-25 MED ORDER — HYDROXYZINE HCL 25 MG PO TABS
25.0000 mg | ORAL_TABLET | Freq: Four times a day (QID) | ORAL | Status: DC | PRN
  Filled 2020-01-25: qty 1

## 2020-01-25 MED ORDER — PANCRELIPASE (LIP-PROT-AMYL) 12000-38000 UNITS PO CPEP
24000.0000 [IU] | ORAL_CAPSULE | Freq: Three times a day (TID) | ORAL | Status: DC
  Administered 2020-01-25 – 2020-01-28 (×8): 24000 [IU] via ORAL
  Filled 2020-01-25 (×12): qty 2

## 2020-01-25 MED ORDER — SODIUM CHLORIDE 0.9 % IV SOLN
250.0000 mL | INTRAVENOUS | Status: DC | PRN
  Administered 2020-01-26: 250 mL via INTRAVENOUS

## 2020-01-25 MED ORDER — LEVETIRACETAM IN NACL 1500 MG/100ML IV SOLN
1500.0000 mg | Freq: Once | INTRAVENOUS | Status: AC
  Administered 2020-01-25: 1500 mg via INTRAVENOUS
  Filled 2020-01-25: qty 100

## 2020-01-25 MED ORDER — CALCIUM GLUCONATE-NACL 1-0.675 GM/50ML-% IV SOLN
1.0000 g | Freq: Once | INTRAVENOUS | Status: AC
  Administered 2020-01-25: 1000 mg via INTRAVENOUS
  Filled 2020-01-25: qty 50

## 2020-01-25 MED ORDER — GABAPENTIN 100 MG PO CAPS: 100.0000 mg | ORAL_CAPSULE | Freq: Two times a day (BID) | ORAL | Status: DC

## 2020-01-25 MED ORDER — ESCITALOPRAM OXALATE 10 MG PO TABS
10.0000 mg | ORAL_TABLET | Freq: Every day | ORAL | Status: DC
  Administered 2020-01-25 – 2020-01-28 (×4): 10 mg via ORAL
  Filled 2020-01-25 (×4): qty 1

## 2020-01-25 MED ORDER — HEPARIN SODIUM (PORCINE) 5000 UNIT/ML IJ SOLN
5000.0000 [IU] | Freq: Three times a day (TID) | INTRAMUSCULAR | Status: DC
  Administered 2020-01-25 – 2020-01-28 (×6): 5000 [IU] via SUBCUTANEOUS
  Filled 2020-01-25 (×6): qty 1

## 2020-01-25 MED ORDER — CHLORHEXIDINE GLUCONATE CLOTH 2 % EX PADS
6.0000 | MEDICATED_PAD | Freq: Every day | CUTANEOUS | Status: DC
  Administered 2020-01-25 – 2020-01-28 (×2): 6 via TOPICAL
  Filled 2020-01-25: qty 6

## 2020-01-25 MED ORDER — LIDOCAINE-PRILOCAINE 2.5-2.5 % EX CREA
1.0000 "application " | TOPICAL_CREAM | CUTANEOUS | Status: DC
  Administered 2020-01-28: 1 via TOPICAL
  Filled 2020-01-25: qty 5

## 2020-01-25 MED ORDER — CALCIUM ACETATE (PHOS BINDER) 667 MG PO CAPS
1334.0000 mg | ORAL_CAPSULE | Freq: Three times a day (TID) | ORAL | Status: DC
  Administered 2020-01-25 – 2020-01-28 (×7): 1334 mg via ORAL
  Filled 2020-01-25 (×12): qty 2

## 2020-01-25 MED ORDER — ATORVASTATIN CALCIUM 20 MG PO TABS
10.0000 mg | ORAL_TABLET | Freq: Every day | ORAL | Status: DC
  Administered 2020-01-25 – 2020-01-28 (×4): 10 mg via ORAL
  Filled 2020-01-25 (×4): qty 1

## 2020-01-25 MED ORDER — RENA-VITE PO TABS
1.0000 | ORAL_TABLET | Freq: Every day | ORAL | Status: DC
  Administered 2020-01-25 – 2020-01-28 (×4): 1 via ORAL
  Filled 2020-01-25 (×5): qty 1

## 2020-01-25 MED ORDER — INSULIN ASPART 100 UNIT/ML ~~LOC~~ SOLN
0.0000 [IU] | Freq: Three times a day (TID) | SUBCUTANEOUS | Status: DC
  Administered 2020-01-25: 1 [IU] via SUBCUTANEOUS
  Administered 2020-01-26: 2 [IU] via SUBCUTANEOUS
  Administered 2020-01-26 (×2): 1 [IU] via SUBCUTANEOUS
  Administered 2020-01-27 – 2020-01-28 (×3): 2 [IU] via SUBCUTANEOUS
  Filled 2020-01-25 (×7): qty 1

## 2020-01-25 MED ORDER — ASPIRIN 300 MG RE SUPP
300.0000 mg | Freq: Once | RECTAL | Status: AC
  Administered 2020-01-25: 300 mg via RECTAL
  Filled 2020-01-25: qty 1

## 2020-01-25 NOTE — Progress Notes (Signed)
[facility], [place] 01/25/20  Subjective:   LOS: 0  Patient presented to the ER via EMS from home for dizziness and fall.  ER staff concern for possible seizure versus syncope.  Noted to have hyperkalemia therefore urgent nephrology consult requested for evaluation for dialysis.  Patient is able to answer questions.  Somewhat somnolent.   Last HD was   Objective:  Vital signs in last 24 hours:  Temp:  [97.4 F (36.3 C)-97.6 F (36.4 C)] 97.6 F (36.4 C) (11/29 1222) Pulse Rate:  [74-88] 74 (11/29 1222) Resp:  [13-21] 20 (11/29 1222) BP: (141-166)/(75-89) 146/85 (11/29 1222) SpO2:  [100 %] 100 % (11/29 1222) Weight:  [79.8 kg] 79.8 kg (11/29 0834)  Weight change:  Filed Weights   01/25/20 0834  Weight: 79.8 kg    Intake/Output:   No intake or output data in the 24 hours ending 01/25/20 1558  Physical Exam: General:  No acute distress, laying in the bed  HEENT  anicteric, moist oral mucous membrane  Pulm/lungs  normal breathing effort, lungs are clear to auscultation  CVS/Heart  regular rhythm, no rub or gallop  Abdomen:   Soft, nontender  Extremities:  No peripheral edema  Neurologic:  Alert, able to follow commands  Skin:  No acute rashes   Access: AVF       Basic Metabolic Panel:  Recent Labs  Lab 01/25/20 0816  NA 131*  K 6.0*  CL 91*  CO2 24  GLUCOSE 235*  BUN 43*  CREATININE 7.70*  CALCIUM 7.9*  MG 1.4*     CBC: Recent Labs  Lab 01/25/20 0816  WBC 7.8  NEUTROABS 6.2  HGB 11.7*  HCT 37.4*  MCV 99.2  PLT 170      Lab Results  Component Value Date   HEPBSAG NON REACTIVE 05/01/2019   HEPBSAB Reactive (A) 05/05/2019      Microbiology:  Recent Results (from the past 240 hour(s))  Resp Panel by RT-PCR (Flu A&B, Covid) Nasopharyngeal Swab     Status: None   Collection Time: 01/25/20  8:16 AM   Specimen: Nasopharyngeal Swab; Nasopharyngeal(NP) swabs in vial transport medium  Result Value Ref Range  Status   SARS Coronavirus 2 by RT PCR NEGATIVE NEGATIVE Final    Comment: (NOTE) SARS-CoV-2 target nucleic acids are NOT DETECTED.  The SARS-CoV-2 RNA is generally detectable in upper respiratory specimens during the acute phase of infection. The lowest concentration of SARS-CoV-2 viral copies this assay can detect is 138 copies/mL. A negative result does not preclude SARS-Cov-2 infection and should not be used as the sole basis for treatment or other patient management decisions. A negative result may occur with  improper specimen collection/handling, submission of specimen other than nasopharyngeal swab, presence of viral mutation(s) within the areas targeted by this assay, and inadequate number of viral copies(<138 copies/mL). A negative result must be combined with clinical observations, patient history, and epidemiological information. The expected result is Negative.  Fact Sheet for Patients:  EntrepreneurPulse.com.au  Fact Sheet for Healthcare Providers:  IncredibleEmployment.be  This test is no t yet approved or cleared by the [place] FDA and  has been authorized for detection and/or diagnosis of SARS-CoV-2 by FDA under an Emergency Use Authorization (EUA). This EUA will remain  in effect (meaning this test can be used) for the duration of the COVID-19 declaration under Section 564(b)(1) of the Act, 21 U.S.C.section 360bbb-3(b)(1), unless the authorization is terminated  or revoked sooner.  Influenza A by PCR NEGATIVE NEGATIVE Final   Influenza B by PCR NEGATIVE NEGATIVE Final    Comment: (NOTE) The Xpert Xpress SARS-CoV-2/FLU/RSV plus assay is intended as an aid in the diagnosis of influenza from Nasopharyngeal swab specimens and should not be used as a sole basis for treatment. Nasal washings and aspirates are unacceptable for Xpert Xpress SARS-CoV-2/FLU/RSV testing.  Fact Sheet for  Patients: EntrepreneurPulse.com.au  Fact Sheet for Healthcare Providers: IncredibleEmployment.be  This test is not yet approved or cleared by the [place] FDA and has been authorized for detection and/or diagnosis of SARS-CoV-2 by FDA under an Emergency Use Authorization (EUA). This EUA will remain in effect (meaning this test can be used) for the duration of the COVID-19 declaration under Section 564(b)(1) of the Act, 21 U.S.C. section 360bbb-3(b)(1), unless the authorization is terminated or revoked.  Performed at [facility], [address] [place], [place] [address]     Coagulation Studies: No results for input(s): LABPROT, INR in the last 72 hours.  Urinalysis: No results for input(s): COLORURINE, LABSPEC, PHURINE, GLUCOSEU, HGBUR, BILIRUBINUR, KETONESUR, PROTEINUR, UROBILINOGEN, NITRITE, LEUKOCYTESUR in the last 72 hours.  Invalid input(s): APPERANCEUR    Imaging: DG Chest 1 View  Result Date: 01/25/2020 CLINICAL DATA:  Fall/syncope. EXAM: CHEST  1 VIEW COMPARISON:  May 02, 2019. FINDINGS: Similar mildly enlarged cardiac silhouette. Aortic atherosclerosis. No consolidation. Streaky bibasilar opacities. No visible pleural effusions or pneumothorax. Multiple healing right rib fractures. Remote left rib fracture. Right thoracic inlet clips. IMPRESSION: 1. Streaky bibasilar opacities, favor atelectasis. Aspiration or pneumonia is not excluded. 2. Healing right rib fractures. Electronically Signed   By: Margaretha Sheffield MD   On: 01/25/2020 09:14   DG Elbow 2 Views Left  Result Date: 01/25/2020 CLINICAL DATA:  [place] this morning.  Bilateral elbow pain. EXAM: LEFT ELBOW - 2 VIEW COMPARISON:  None. FINDINGS: No fracture or bone lesion. Elbow joint is normally spaced and aligned.  No joint effusion. There are anterior surgical vascular clips. IMPRESSION: No fracture or elbow joint abnormality Electronically Signed   By: Lajean Manes M.D.   On: 01/25/2020 09:09   DG Elbow 2 Views Right  Result Date: 01/25/2020 CLINICAL DATA:  Dizziness for 4 days. Fell this morning. Bilateral elbow pain. EXAM: RIGHT ELBOW - 2 VIEW COMPARISON:  None. FINDINGS: There is no evidence of fracture, dislocation, or joint effusion. There is no evidence of arthropathy or other focal bone abnormality. Soft tissues are unremarkable. IMPRESSION: Negative. Electronically Signed   By: Lajean Manes M.D.   On: 01/25/2020 09:10   CT Head Wo Contrast  Result Date: 01/25/2020 CLINICAL DATA:  Fall this morning.  Dizziness for 4 days. EXAM: CT HEAD WITHOUT CONTRAST CT CERVICAL SPINE WITHOUT CONTRAST TECHNIQUE: Multidetector CT imaging of the head and cervical spine was performed following the standard protocol without intravenous contrast. Multiplanar CT image reconstructions of the cervical spine were also generated. COMPARISON:  04/07/2019 FINDINGS: CT HEAD FINDINGS Brain: No evidence of acute infarction, hemorrhage, hydrocephalus, extra-axial collection or mass lesion/mass effect. There is ventricular and sulcal enlargement reflecting mild diffuse atrophy. Small old white matter lacunar infarct noted in the left frontal lobe. Small lacunar infarct noted in the left central pons. Bilateral periventricular and subcortical white matter hypoattenuation is also present consistent mild to moderate chronic microvascular ischemic change. Vascular: No hyperdense vessel or unexpected calcification. Skull: Normal. Negative for fracture or focal lesion. Sinuses/Orbits: Left maxillary sinus is opacified. There is a defect along the lower  lateral wall of the sinus with bone density material within the sinus cavities near its floor. Heterogeneous soft tissue attenuation bulges from the medial aspect of the sinus into the nasal cavity. There is complete opacification of the left frontal sinus and the anterior and several middle left ethmoid air cells. The defect along the  lateral left maxillary sinus wall is new since the prior study. Opacification of the left frontal sinus and ethmoid air cells is also new. Globes and orbits are unremarkable. Other: None. CT CERVICAL SPINE FINDINGS Alignment: Normal. Skull base and vertebrae: No acute fracture. No primary bone lesion or focal pathologic process. Soft tissues and spinal canal: No prevertebral fluid or swelling. No visible canal hematoma. Disc levels: Moderate loss of disc height at C6-C7 with spondylotic disc bulging. Remaining discs relatively well preserved in height. No convincing disc herniation. There are facet degenerative changes greatest on the right from C2-C3 through C5-C6. Upper chest: Small right pleural effusion. Other: None. IMPRESSION: HEAD CT 1. No acute intracranial abnormalities. 2. Significant left-sided sinus disease, which has progressed since the prior CT. Left maxillary sinus is completely opacified and there is a new defect along the lower lateral wall. Active infection, including fungal infection, should be considered. There is also new complete opacification of the left frontal and anterior left ethmoid air cells, consistent with occlusion of the left ostiomeatal complex. CERVICAL CT 1. No fracture or acute skeletal abnormality. 2. Small right pleural effusion. Electronically Signed   By: Lajean Manes M.D.   On: 01/25/2020 09:00   CT Cervical Spine Wo Contrast  Result Date: 01/25/2020 CLINICAL DATA:  Fall this morning.  Dizziness for 4 days. EXAM: CT HEAD WITHOUT CONTRAST CT CERVICAL SPINE WITHOUT CONTRAST TECHNIQUE: Multidetector CT imaging of the head and cervical spine was performed following the standard protocol without intravenous contrast. Multiplanar CT image reconstructions of the cervical spine were also generated. COMPARISON:  04/07/2019 FINDINGS: CT HEAD FINDINGS Brain: No evidence of acute infarction, hemorrhage, hydrocephalus, extra-axial collection or mass lesion/mass effect. There is  ventricular and sulcal enlargement reflecting mild diffuse atrophy. Small old white matter lacunar infarct noted in the left frontal lobe. Small lacunar infarct noted in the left central pons. Bilateral periventricular and subcortical white matter hypoattenuation is also present consistent mild to moderate chronic microvascular ischemic change. Vascular: No hyperdense vessel or unexpected calcification. Skull: Normal. Negative for fracture or focal lesion. Sinuses/Orbits: Left maxillary sinus is opacified. There is a defect along the lower lateral wall of the sinus with bone density material within the sinus cavities near its floor. Heterogeneous soft tissue attenuation bulges from the medial aspect of the sinus into the nasal cavity. There is complete opacification of the left frontal sinus and the anterior and several middle left ethmoid air cells. The defect along the lateral left maxillary sinus wall is new since the prior study. Opacification of the left frontal sinus and ethmoid air cells is also new. Globes and orbits are unremarkable. Other: None. CT CERVICAL SPINE FINDINGS Alignment: Normal. Skull base and vertebrae: No acute fracture. No primary bone lesion or focal pathologic process. Soft tissues and spinal canal: No prevertebral fluid or swelling. No visible canal hematoma. Disc levels: Moderate loss of disc height at C6-C7 with spondylotic disc bulging. Remaining discs relatively well preserved in height. No convincing disc herniation. There are facet degenerative changes greatest on the right from C2-C3 through C5-C6. Upper chest: Small right pleural effusion. Other: None. IMPRESSION: HEAD CT 1. No acute intracranial abnormalities. 2.  Significant left-sided sinus disease, which has progressed since the prior CT. Left maxillary sinus is completely opacified and there is a new defect along the lower lateral wall. Active infection, including fungal infection, should be considered. There is also new  complete opacification of the left frontal and anterior left ethmoid air cells, consistent with occlusion of the left ostiomeatal complex. CERVICAL CT 1. No fracture or acute skeletal abnormality. 2. Small right pleural effusion. Electronically Signed   By: Lajean Manes M.D.   On: 01/25/2020 09:00     Medications:   . sodium chloride    . lacosamide (VIMPAT) IV 100 mg (01/25/20 1328)   . atorvastatin  10 mg Oral Daily  . calcium acetate  1,334 mg Oral TID WC  . Chlorhexidine Gluconate Cloth  6 each Topical Q0600  . clopidogrel  75 mg Oral Daily  . escitalopram  10 mg Oral Daily  . gabapentin  100 mg Oral BID  . heparin  5,000 Units Subcutaneous Q8H  . insulin aspart  0-9 Units Subcutaneous TID WC  . [START ON 01/26/2020] levothyroxine  200 mcg Oral QAC breakfast  . [START ON 01/26/2020] lidocaine-prilocaine  1 application Topical Q T,Th,Sa-HD  . lipase/protease/amylase  24,000 Units Oral TID WC  . losartan  100 mg Oral Daily  . multivitamin  1 tablet Oral Daily  . sodium chloride flush  3 mL Intravenous Q12H  . traZODone  100 mg Oral QHS   sodium chloride, hydrOXYzine, polyethylene glycol powder, sodium chloride flush  Assessment/ Plan:  69 y.o. male with  was admitted on 01/25/2020 for  Principal Problem:   Seizure [facility]) Active Problems:   ESRD (end stage renal disease) ([place])   Hypothyroidism   Cerebrovascular accident (CVA) ([place])   Diabetic polyneuropathy associated with type 2 diabetes mellitus ([place])   Depression  Hyperkalemia [E87.5] Hypomagnesemia [E83.42] Seizure ([place]) [R56.9] Abnormal ECG [R94.31] Hyperglycemia [R73.9] ESRD (end stage renal disease) ([place]) [N18.6] Troponin I above reference range [R77.8]  #. ESRD with hyperkalemia Will arrange urgent HD today [place]  #. Anemia of CKD  Lab Results  Component Value Date   HGB 11.7 (L) 01/25/2020   Low dose EPO with HD- HOLD due to recent seizure  #. Secondary hyperparathyroidism of renal origin N  25.81      Component Value Date/Time   PTH 91 (H) 03/13/2019 0436   Lab Results  Component Value Date   PHOS 3.6 07/29/2019   Monitor calcium and phos level during this admission   #. Diabetes type 2 with CKD Hgb A1c MFr Bld (%)  Date Value  07/22/2019 6.4 (H)    # Seizure vs syncope Work up in progress    LOS: 0 Hemi Chacko 11/29/20213:58 PM  [employer], [place]

## 2020-01-25 NOTE — Progress Notes (Signed)
[facility]  ROUNDING NOTE   Subjective:   Patient presented to the ED today, with a possible seizure on fall. Patient appears drowsy postictal on our assessment.  He has history of end-stage renal disease and is on hemodialysis Monday Wednesday and Friday. We will plan for dialysis today.     Objective:  Vital signs in last 24 hours:  Temp:  [97.4 F (36.3 C)-97.6 F (36.4 C)] 97.6 F (36.4 C) (11/29 1222) Pulse Rate:  [74-88] 74 (11/29 1222) Resp:  [13-21] 20 (11/29 1222) BP: (141-166)/(75-89) 146/85 (11/29 1222) SpO2:  [100 %] 100 % (11/29 1222) Weight:  [79.8 kg] 79.8 kg (11/29 0834)  Weight change:  Filed Weights   01/25/20 0834  Weight: 79.8 kg    Intake/Output: No intake/output data recorded.   Intake/Output this shift:  No intake/output data recorded.  Physical Exam: General:  In no acute distress  Head: Normocephalic, atraumatic. Moist oral mucosal membranes  Eyes:  Sclerae and conjunctivae clear  Lungs:   Respiration even, unlabored, lungs clear  Heart: Regular rate and rhythm  Abdomen:  Soft, nontender, nondistended  Extremities:  No peripheral edema.  Neurologic:  Sleeping, arousable to call, postictal  Skin: No acute lesions or rashes  Access:  Left upper arm aVF    Basic Metabolic Panel: Recent Labs  Lab 01/25/20 0816  NA 131*  K 6.0*  CL 91*  CO2 24  GLUCOSE 235*  BUN 43*  CREATININE 7.70*  CALCIUM 7.9*  MG 1.4*    Liver Function Tests: Recent Labs  Lab 01/25/20 0816  AST 16  ALT 7  ALKPHOS 93  BILITOT 1.2  PROT 6.8  ALBUMIN 3.5   No results for input(s): LIPASE, AMYLASE in the last 168 hours. No results for input(s): AMMONIA in the last 168 hours.  CBC: Recent Labs  Lab 01/25/20 0816  WBC 7.8  NEUTROABS 6.2  HGB 11.7*  HCT 37.4*  MCV 99.2  PLT 170    Cardiac Enzymes: No results for input(s): CKTOTAL, CKMB, CKMBINDEX, TROPONINI in the last 168 hours.  BNP: Invalid input(s):  POCBNP  CBG: Recent Labs  Lab 01/25/20 1013 01/25/20 1330  GLUCAP 225* 149*    Microbiology: Results for orders placed or performed during the hospital encounter of 01/25/20  Resp Panel by RT-PCR (Flu A&B, Covid) Nasopharyngeal Swab     Status: None   Collection Time: 01/25/20  8:16 AM   Specimen: Nasopharyngeal Swab; Nasopharyngeal(NP) swabs in vial transport medium  Result Value Ref Range Status   SARS Coronavirus 2 by RT PCR NEGATIVE NEGATIVE Final    Comment: (NOTE) SARS-CoV-2 target nucleic acids are NOT DETECTED.  The SARS-CoV-2 RNA is generally detectable in upper respiratory specimens during the acute phase of infection. The lowest concentration of SARS-CoV-2 viral copies this assay can detect is 138 copies/mL. A negative result does not preclude SARS-Cov-2 infection and should not be used as the sole basis for treatment or other patient management decisions. A negative result may occur with  improper specimen collection/handling, submission of specimen other than nasopharyngeal swab, presence of viral mutation(s) within the areas targeted by this assay, and inadequate number of viral copies(<138 copies/mL). A negative result must be combined with clinical observations, patient history, and epidemiological information. The expected result is Negative.  Fact Sheet for Patients:  EntrepreneurPulse.com.au  Fact Sheet for Healthcare Providers:  IncredibleEmployment.be  This test is no t yet approved or cleared by the [place] and  has been authorized for  detection and/or diagnosis of SARS-CoV-2 by FDA under an Emergency Use Authorization (EUA). This EUA will remain  in effect (meaning this test can be used) for the duration of the COVID-19 declaration under Section 564(b)(1) of the Act, 21 U.S.C.section 360bbb-3(b)(1), unless the authorization is terminated  or revoked sooner.       Influenza A by PCR NEGATIVE NEGATIVE  Final   Influenza B by PCR NEGATIVE NEGATIVE Final    Comment: (NOTE) The Xpert Xpress SARS-CoV-2/FLU/RSV plus assay is intended as an aid in the diagnosis of influenza from Nasopharyngeal swab specimens and should not be used as a sole basis for treatment. Nasal washings and aspirates are unacceptable for Xpert Xpress SARS-CoV-2/FLU/RSV testing.  Fact Sheet for Patients: EntrepreneurPulse.com.au  Fact Sheet for Healthcare Providers: IncredibleEmployment.be  This test is not yet approved or cleared by the [place] FDA and has been authorized for detection and/or diagnosis of SARS-CoV-2 by FDA under an Emergency Use Authorization (EUA). This EUA will remain in effect (meaning this test can be used) for the duration of the COVID-19 declaration under Section 564(b)(1) of the Act, 21 U.S.C. section 360bbb-3(b)(1), unless the authorization is terminated or revoked.  Performed at [facility], [address] [place], [place] [address]     Coagulation Studies: No results for input(s): LABPROT, INR in the last 72 hours.  Urinalysis: No results for input(s): COLORURINE, LABSPEC, PHURINE, GLUCOSEU, HGBUR, BILIRUBINUR, KETONESUR, PROTEINUR, UROBILINOGEN, NITRITE, LEUKOCYTESUR in the last 72 hours.  Invalid input(s): APPERANCEUR    Imaging: DG Chest 1 View  Result Date: 01/25/2020 CLINICAL DATA:  Fall/syncope. EXAM: CHEST  1 VIEW COMPARISON:  May 02, 2019. FINDINGS: Similar mildly enlarged cardiac silhouette. Aortic atherosclerosis. No consolidation. Streaky bibasilar opacities. No visible pleural effusions or pneumothorax. Multiple healing right rib fractures. Remote left rib fracture. Right thoracic inlet clips. IMPRESSION: 1. Streaky bibasilar opacities, favor atelectasis. Aspiration or pneumonia is not excluded. 2. Healing right rib fractures. Electronically Signed   By: Margaretha Sheffield MD   On: 01/25/2020 09:14   DG Elbow 2  Views Left  Result Date: 01/25/2020 CLINICAL DATA:  [place] this morning.  Bilateral elbow pain. EXAM: LEFT ELBOW - 2 VIEW COMPARISON:  None. FINDINGS: No fracture or bone lesion. Elbow joint is normally spaced and aligned.  No joint effusion. There are anterior surgical vascular clips. IMPRESSION: No fracture or elbow joint abnormality Electronically Signed   By: Lajean Manes M.D.   On: 01/25/2020 09:09   DG Elbow 2 Views Right  Result Date: 01/25/2020 CLINICAL DATA:  Dizziness for 4 days. Fell this morning. Bilateral elbow pain. EXAM: RIGHT ELBOW - 2 VIEW COMPARISON:  None. FINDINGS: There is no evidence of fracture, dislocation, or joint effusion. There is no evidence of arthropathy or other focal bone abnormality. Soft tissues are unremarkable. IMPRESSION: Negative. Electronically Signed   By: Lajean Manes M.D.   On: 01/25/2020 09:10   CT Head Wo Contrast  Result Date: 01/25/2020 CLINICAL DATA:  Fall this morning.  Dizziness for 4 days. EXAM: CT HEAD WITHOUT CONTRAST CT CERVICAL SPINE WITHOUT CONTRAST TECHNIQUE: Multidetector CT imaging of the head and cervical spine was performed following the standard protocol without intravenous contrast. Multiplanar CT image reconstructions of the cervical spine were also generated. COMPARISON:  04/07/2019 FINDINGS: CT HEAD FINDINGS Brain: No evidence of acute infarction, hemorrhage, hydrocephalus, extra-axial collection or mass lesion/mass effect. There is ventricular and sulcal enlargement reflecting mild diffuse atrophy. Small old white matter lacunar infarct noted in the left  frontal lobe. Small lacunar infarct noted in the left central pons. Bilateral periventricular and subcortical white matter hypoattenuation is also present consistent mild to moderate chronic microvascular ischemic change. Vascular: No hyperdense vessel or unexpected calcification. Skull: Normal. Negative for fracture or focal lesion. Sinuses/Orbits: Left maxillary sinus is opacified.  There is a defect along the lower lateral wall of the sinus with bone density material within the sinus cavities near its floor. Heterogeneous soft tissue attenuation bulges from the medial aspect of the sinus into the nasal cavity. There is complete opacification of the left frontal sinus and the anterior and several middle left ethmoid air cells. The defect along the lateral left maxillary sinus wall is new since the prior study. Opacification of the left frontal sinus and ethmoid air cells is also new. Globes and orbits are unremarkable. Other: None. CT CERVICAL SPINE FINDINGS Alignment: Normal. Skull base and vertebrae: No acute fracture. No primary bone lesion or focal pathologic process. Soft tissues and spinal canal: No prevertebral fluid or swelling. No visible canal hematoma. Disc levels: Moderate loss of disc height at C6-C7 with spondylotic disc bulging. Remaining discs relatively well preserved in height. No convincing disc herniation. There are facet degenerative changes greatest on the right from C2-C3 through C5-C6. Upper chest: Small right pleural effusion. Other: None. IMPRESSION: HEAD CT 1. No acute intracranial abnormalities. 2. Significant left-sided sinus disease, which has progressed since the prior CT. Left maxillary sinus is completely opacified and there is a new defect along the lower lateral wall. Active infection, including fungal infection, should be considered. There is also new complete opacification of the left frontal and anterior left ethmoid air cells, consistent with occlusion of the left ostiomeatal complex. CERVICAL CT 1. No fracture or acute skeletal abnormality. 2. Small right pleural effusion. Electronically Signed   By: Lajean Manes M.D.   On: 01/25/2020 09:00   CT Cervical Spine Wo Contrast  Result Date: 01/25/2020 CLINICAL DATA:  Fall this morning.  Dizziness for 4 days. EXAM: CT HEAD WITHOUT CONTRAST CT CERVICAL SPINE WITHOUT CONTRAST TECHNIQUE: Multidetector CT  imaging of the head and cervical spine was performed following the standard protocol without intravenous contrast. Multiplanar CT image reconstructions of the cervical spine were also generated. COMPARISON:  04/07/2019 FINDINGS: CT HEAD FINDINGS Brain: No evidence of acute infarction, hemorrhage, hydrocephalus, extra-axial collection or mass lesion/mass effect. There is ventricular and sulcal enlargement reflecting mild diffuse atrophy. Small old white matter lacunar infarct noted in the left frontal lobe. Small lacunar infarct noted in the left central pons. Bilateral periventricular and subcortical white matter hypoattenuation is also present consistent mild to moderate chronic microvascular ischemic change. Vascular: No hyperdense vessel or unexpected calcification. Skull: Normal. Negative for fracture or focal lesion. Sinuses/Orbits: Left maxillary sinus is opacified. There is a defect along the lower lateral wall of the sinus with bone density material within the sinus cavities near its floor. Heterogeneous soft tissue attenuation bulges from the medial aspect of the sinus into the nasal cavity. There is complete opacification of the left frontal sinus and the anterior and several middle left ethmoid air cells. The defect along the lateral left maxillary sinus wall is new since the prior study. Opacification of the left frontal sinus and ethmoid air cells is also new. Globes and orbits are unremarkable. Other: None. CT CERVICAL SPINE FINDINGS Alignment: Normal. Skull base and vertebrae: No acute fracture. No primary bone lesion or focal pathologic process. Soft tissues and spinal canal: No prevertebral fluid or swelling. No  visible canal hematoma. Disc levels: Moderate loss of disc height at C6-C7 with spondylotic disc bulging. Remaining discs relatively well preserved in height. No convincing disc herniation. There are facet degenerative changes greatest on the right from C2-C3 through C5-C6. Upper chest:  Small right pleural effusion. Other: None. IMPRESSION: HEAD CT 1. No acute intracranial abnormalities. 2. Significant left-sided sinus disease, which has progressed since the prior CT. Left maxillary sinus is completely opacified and there is a new defect along the lower lateral wall. Active infection, including fungal infection, should be considered. There is also new complete opacification of the left frontal and anterior left ethmoid air cells, consistent with occlusion of the left ostiomeatal complex. CERVICAL CT 1. No fracture or acute skeletal abnormality. 2. Small right pleural effusion. Electronically Signed   By: Lajean Manes M.D.   On: 01/25/2020 09:00     Medications:   . sodium chloride    . lacosamide (VIMPAT) IV 100 mg (01/25/20 1328)   . atorvastatin  10 mg Oral Daily  . calcium acetate  1,334 mg Oral TID WC  . Chlorhexidine Gluconate Cloth  6 each Topical Q0600  . clopidogrel  75 mg Oral Daily  . escitalopram  10 mg Oral Daily  . gabapentin  100 mg Oral BID  . heparin  5,000 Units Subcutaneous Q8H  . insulin aspart  0-9 Units Subcutaneous TID WC  . [START ON 01/26/2020] levothyroxine  200 mcg Oral QAC breakfast  . [START ON 01/26/2020] lidocaine-prilocaine  1 application Topical Q T,Th,Sa-HD  . lipase/protease/amylase  24,000 Units Oral TID WC  . losartan  100 mg Oral Daily  . multivitamin  1 tablet Oral Daily  . sodium chloride flush  3 mL Intravenous Q12H  . traZODone  100 mg Oral QHS   sodium chloride, hydrOXYzine, polyethylene glycol powder, sodium chloride flush  Assessment/ Plan:  Kyle [name] is a 69 y.o.  male with medical problems  including CVA with left-sided weakness, ESRD on HD MWF, seizure disorder, DM, hypertension, presented to the emergency department for a possible seizure at home.  #End-stage renal disease on HD MWF We will plan for dialysis today Orders placed We will continue MWF schedule  #Hyperkalemia Potassium 6.0 Planning  dialysis today  #Diabetes type 2 with CKD Lab Results  Component Value Date   HGBA1C 6.4 (H) 07/22/2019  Blood glucose readings above goal Patient is on insulin Aspart  #Anemia with CKD Lab Results  Component Value Date   HGB 11.7 (L) 01/25/2020  No acute indication for Epogen  #Secondary hyperparathyroidism Lab Results  Component Value Date   PTH 91 (H) 03/13/2019   CALCIUM 7.9 (L) 01/25/2020   CAION 0.93 (L) 04/05/2019   PHOS 3.6 07/29/2019  We will continue monitoring bone mineral metabolism parameters Patient is on calcium acetate  #Possible seizure history of seizure disorder Patient restarted on Keppra in the emergency department Planning neurology consult  LOS: 0 Samanthamarie Ezzell 11/29/20214:00 PM

## 2020-01-25 NOTE — Procedures (Signed)
Patient Name: Kyle [name]  MRN: [id number]  Epilepsy Attending: Lora Havens  Referring Physician/Provider: Dr Kerney Elbe Date: 01/25/2020  Duration: 27.53 mins  Patient history: 69 year old male presenting with seizure recurrence after recent discontinuation of anticonvulsant. EEG to evaluate for seizure  Level of alertness: Awake  AEDs during EEG study: GBP, LCM  Technical aspects: This EEG study was done with scalp electrodes positioned according to the 10-20 International system of electrode placement. Electrical activity was acquired at a sampling rate of 500Hz  and reviewed with a high frequency filter of 70Hz  and a low frequency filter of 1Hz . EEG data were recorded continuously and digitally stored.   Description: No posterior dominant rhythm was seen.  EEG showed continuous generalized and maximal right temporal region 5 to 6 Hz theta slowing as well as 2-3hz  delta slowing in right temporal region. Physiologic photic driving was not seen during photic stimulation.  Hyperventilation was not performed.     ABNORMALITY -Continuous slow, generalized and maximal right temporal region  IMPRESSION: This study is suggestive of cortical dysfunction in right temporal region which could be secondary to underlying structural abnormality, post-ictal state. There is also moderate diffuse encephalopathy, nonspecific etiology. No seizures or epileptiform discharges were seen throughout the recording.  [place] Barbra Sarks

## 2020-01-25 NOTE — Consult Note (Signed)
NEURO HOSPITALIST CONSULT NOTE   Requestig physician: Dr. Francine Graven  Reason for Consult: Seizure recurrence  History obtained from:  Chart     HPI:                                                                                                                                          Kyle [name] is an 69 y.o. male with a PMHx of stroke (with residual left sided weakness), ESRD on HD, COPD, depression, DM, HLD, HTN, hypothyroidism and seizures (last seizure in 2015), who presents to the ED after a fall at home as well as concern for seizure per patient. He has been feeling weak and dizzy for months, but dizziness was significantly worse over the past 2 days. He then fell this morning. He stated to the EDP that he thought he had a seizure. He did not stroke his head with the fall, but did think that he lost consciousness. EDP exam revealed evidence for tongue-bite. However, the patient denied urinary incontinence.   While in the ED, the patient was noted by stafff to have some abnormal movements of the upper extremities.  These lasted less than 1 minute and resolved without any intervention; however, on assessment by EDP, the patient was somewhat somnolent and appeared to have bit his lip. Given concern for possible seizure he was loaded with Keppra and Neurology was consulted.  Of note, he had recent discontinuation of Keppra following an unremarkable EEG. The patient denies EtOH use.   Past Medical History:  Diagnosis Date  . Anemia   . Anxiety   . BPH (benign prostatic hyperplasia)   . Chronic kidney disease   . Colon polyps   . COPD (chronic obstructive pulmonary disease) ([place])   . Depression   . Diabetes mellitus without complication ([place])   . Diverticulosis of colon   . History of kidney stones   . Hyperlipidemia   . Hypertension   . Hypothyroidism   . Nephrolithiasis   . Seizures ([place])    2015 last seizure  . Stroke [facility])     Past Surgical History:   Procedure Laterality Date  . AV FISTULA PLACEMENT Left 03/30/2019   Procedure: right brachiocephalic fistula creation;  Surgeon: Marty Heck, MD;  Location: [place];  Service: Vascular;  Laterality: Left;  . BIOPSY  04/05/2019   Procedure: BIOPSY;  Surgeon: Wilford Corner, MD;  Location: [place];  Service: Endoscopy;;  . COLONOSCOPY    . COLONOSCOPY WITH PROPOFOL N/A 05/30/2015   Procedure: COLONOSCOPY WITH PROPOFOL;  Surgeon: Manya Silvas, MD;  Location: [facility] ENDOSCOPY;  Service: Endoscopy;  Laterality: N/A;  . COLONOSCOPY WITH PROPOFOL N/A 12/01/2018   Procedure: COLONOSCOPY WITH PROPOFOL;  Surgeon: Toledo, [name], MD;  Location: [facility] ENDOSCOPY;  Service: Gastroenterology;  Laterality: N/A;  . COLONOSCOPY WITH PROPOFOL N/A 04/09/2019   Procedure: COLONOSCOPY WITH PROPOFOL;  Surgeon: Carol Ada, MD;  Location: [place];  Service: Endoscopy;  Laterality: N/A;  . ENTEROSCOPY N/A 05/05/2019   Procedure: ENTEROSCOPY;  Surgeon: Ronnette Juniper, MD;  Location: [facility] ENDOSCOPY;  Service: Gastroenterology;  Laterality: N/A;  Push enteroscopy  . ENTEROSCOPY N/A 07/28/2019   Procedure: ENTEROSCOPY;  Surgeon: Virgel Manifold, MD;  Location: [facility] ENDOSCOPY;  Service: Endoscopy;  Laterality: N/A;  . ESOPHAGOGASTRODUODENOSCOPY (EGD) WITH PROPOFOL N/A 04/05/2019   Procedure: ESOPHAGOGASTRODUODENOSCOPY (EGD) WITH PROPOFOL;  Surgeon: Wilford Corner, MD;  Location: [place];  Service: Endoscopy;  Laterality: N/A;  . GIVENS CAPSULE STUDY N/A 05/01/2019   Procedure: GIVENS CAPSULE STUDY;  Surgeon: Ronald Lobo, MD;  Location: [place];  Service: Endoscopy;  Laterality: N/A;  . HOT HEMOSTASIS N/A 04/09/2019   Procedure: HOT HEMOSTASIS (ARGON PLASMA COAGULATION/BICAP);  Surgeon: Carol Ada, MD;  Location: [place];  Service: Endoscopy;  Laterality: N/A;  . HOT HEMOSTASIS N/A 05/05/2019   Procedure: HOT HEMOSTASIS (ARGON PLASMA COAGULATION/BICAP);  Surgeon: Ronnette Juniper, MD;  Location:  [place];  Service: Gastroenterology;  Laterality: N/A;  . INSERTION OF DIALYSIS CATHETER Right 03/30/2019   Procedure: Ultrasound guided right internal jugular tunneled dialysis catheter placement;  Surgeon: Marty Heck, MD;  Location[facility];  Service: Vascular;  Laterality: Right;  . kidney stone    . REMOVAL OF A DIALYSIS CATHETER Right 06/26/2019   Procedure: REMOVAL OF A DIALYSIS CATHETER ( PD CATH REMOVAL AND I J CATH REMOVAL;  Surgeon: Katha Cabal, MD;  Location: [facility] ORS;  Service: Vascular;  Laterality: Right;  . THYROIDECTOMY    . VARICOCELE EXCISION    . VIDEO BRONCHOSCOPY Bilateral 05/25/2016   Procedure: VIDEO BRONCHOSCOPY WITHOUT FLUORO;  Surgeon: Juanito Doom, MD;  Location: [facility] ENDOSCOPY;  Service: Cardiopulmonary;  Laterality: Bilateral;    Family History  Problem Relation Age of Onset  . Diabetes Mother   . Diabetes Maternal Grandmother   . Diabetes Maternal Grandfather   . Lung cancer Father   . Emphysema Paternal Grandfather               Social History:  reports that he has been smoking cigarettes. He has a 10.00 pack-year smoking history. He has never used smokeless tobacco. He reports current alcohol use of about 8.0 standard drinks of alcohol per week. He reports that he does not use drugs.  No Known Allergies  MEDICATIONS:                                                                                                                     No current facility-administered medications on file prior to encounter.   Current Outpatient Medications on File Prior to Encounter  Medication Sig Dispense Refill  . atorvastatin (LIPITOR) 10 MG tablet Take 10 mg by mouth daily.    . B Complex-C-Folic Acid (NEPHRO-VITE PO) Take 1 tablet by mouth daily.    [name]  calcium acetate (PHOSLO) 667 MG capsule Take 2 capsules (1,334 mg total) by mouth 3 (three) times daily with meals. (Patient taking differently: Take 1,334 mg by mouth in the morning and at bedtime. )  180 capsule 0  . clopidogrel (PLAVIX) 75 MG tablet Take 75 mg by mouth daily.    [name] escitalopram (LEXAPRO) 10 MG tablet Take 10 mg by mouth daily.    [name] gabapentin (NEURONTIN) 100 MG capsule Take 100-200 mg by mouth 2 (two) times daily.    . hydrOXYzine (ATARAX/VISTARIL) 25 MG tablet Take 25 mg by mouth every 6 (six) hours as needed for itching.    . insulin glargine (LANTUS) 100 UNIT/ML injection Inject 15 Units into the skin at bedtime.     [name] levothyroxine (SYNTHROID) 200 MCG tablet Take 200 mcg by mouth daily before breakfast.     . lidocaine-prilocaine (EMLA) cream Apply 1 application topically Every Tuesday,Thursday,and Saturday with dialysis.     [name] losartan (COZAAR) 100 MG tablet Take 100 mg by mouth daily.    . Pancrelipase, Lip-Prot-Amyl, 24000-76000 units CPEP Take 1 capsule by mouth 3 (three) times daily with meals.     . pantoprazole (PROTONIX) 40 MG tablet Take 1 tablet (40 mg total) by mouth 2 (two) times daily. 60 tablet 0  . potassium chloride SA (KLOR-CON) 20 MEQ tablet Take 20 mEq by mouth 2 (two) times daily.    . traZODone (DESYREL) 100 MG tablet Take 100 mg by mouth at bedtime.    . levETIRAcetam (KEPPRA) 500 MG tablet Take 500 mg by mouth 2 (two) times a day.    . metoprolol succinate (TOPROL-XL) 50 MG 24 hr tablet Take 1 tablet (50 mg total) by mouth daily. Take with or immediately following a meal. (Patient not taking: Reported on 07/22/2019) 30 tablet 0  . polyethylene glycol powder (GLYCOLAX/MIRALAX) 17 GM/SCOOP powder Take 17 g by mouth daily as needed for moderate constipation.     Note: The patient was not on Keppra at the time of his presentation.   Scheduled: . atorvastatin  10 mg Oral Daily  . calcium acetate  1,334 mg Oral TID WC  . Chlorhexidine Gluconate Cloth  6 each Topical Q0600  . clopidogrel  75 mg Oral Daily  . escitalopram  10 mg Oral Daily  . gabapentin  100-200 mg Oral BID  . heparin  5,000 Units Subcutaneous Q8H  . insulin aspart  0-9 Units  Subcutaneous TID WC  . levETIRAcetam  500 mg Oral BID  . [START ON 01/26/2020] levothyroxine  200 mcg Oral QAC breakfast  . [START ON 01/26/2020] lidocaine-prilocaine  1 application Topical Q T,Th,Sa-HD  . lipase/protease/amylase  24,000 Units Oral TID WC  . losartan  100 mg Oral Daily  . multivitamin  1 tablet Oral Daily  . sodium chloride flush  3 mL Intravenous Q12H  . traZODone  100 mg Oral QHS   Continuous: . sodium chloride       ROS:  As per HPI. The patient is unable to provide a detailed ROS due to postictal state.    Blood pressure (!) 157/84, pulse 76, temperature (!) 97.4 F (36.3 C), temperature source Oral, resp. rate 20, height 6' (1.829 m), weight 79.8 kg, SpO2 100 %.   General Examination:                                                                                                       Physical Exam  HEENT-  Normocephalic. Prominent tongue-bite noted.  Lungs- Respirations unlabored Extremities- Dialysis fistula noted. No pedal edema.    Neurological Examination Mental Status: Somnolent. Requires continuous stimulation to follow commands for exam. Speech mildly dysarthric, but fluent. Able to follow simple motor commands. Did not participate when asked to name objects. Oriented to city, state, year and day, but not the month.  Cranial Nerves: II: Left and right temporal visual fields grossly intact; the patient was unable to participate in detailed testing due to somnolence. Fixates upon and tracks examiner's face  PERRL.  III,IV, VI: EOMI. No nystagmus.  V,VII: No facial droop noted. Not cooperative with sensory exam.  VIII: hearing intact to voice IX,X: No hypophonia noted.  XI: Head is midline XII: Midline tongue extension Motor: RUE 5/5 proximally and distally, except for 4/5 triceps strength RLE 4+/5 proximally and  distally in the context of poor effort LUE 5/5 LLE 5/5 proximally and distally in the context of poor effort Sensory: Not cooperative with sensory exam. Deep Tendon Reflexes: 1+ bilateral brachioradialis, biceps and patellae. Unable to elicit achilles reflexes.  Plantars: Mute bilaterally  Cerebellar: No ataxia with FNF bilaterally (performs slowly) Gait: Deferred   Lab Results: Basic Metabolic Panel: Recent Labs  Lab 01/25/20 0816  NA 131*  K 6.0*  CL 91*  CO2 24  GLUCOSE 235*  BUN 43*  CREATININE 7.70*  CALCIUM 7.9*  MG 1.4*    CBC: Recent Labs  Lab 01/25/20 0816  WBC 7.8  NEUTROABS 6.2  HGB 11.7*  HCT 37.4*  MCV 99.2  PLT 170    Cardiac Enzymes: No results for input(s): CKTOTAL, CKMB, CKMBINDEX, TROPONINI in the last 168 hours.  Lipid Panel: No results for input(s): CHOL, TRIG, HDL, CHOLHDL, VLDL, LDLCALC in the last 168 hours.  Imaging: DG Chest 1 View  Result Date: 01/25/2020 CLINICAL DATA:  Fall/syncope. EXAM: CHEST  1 VIEW COMPARISON:  May 02, 2019. FINDINGS: Similar mildly enlarged cardiac silhouette. Aortic atherosclerosis. No consolidation. Streaky bibasilar opacities. No visible pleural effusions or pneumothorax. Multiple healing right rib fractures. Remote left rib fracture. Right thoracic inlet clips. IMPRESSION: 1. Streaky bibasilar opacities, favor atelectasis. Aspiration or pneumonia is not excluded. 2. Healing right rib fractures. Electronically Signed   By: Margaretha Sheffield MD   On: 01/25/2020 09:14   DG Elbow 2 Views Left  Result Date: 01/25/2020 CLINICAL DATA:  [place] this morning.  Bilateral elbow pain. EXAM: LEFT ELBOW - 2 VIEW COMPARISON:  None. FINDINGS: No fracture or bone lesion. Elbow joint is normally spaced and aligned.  No joint effusion. There are anterior  surgical vascular clips. IMPRESSION: No fracture or elbow joint abnormality Electronically Signed   By: Lajean Manes M.D.   On: 01/25/2020 09:09   DG Elbow 2 Views  Right  Result Date: 01/25/2020 CLINICAL DATA:  Dizziness for 4 days. Fell this morning. Bilateral elbow pain. EXAM: RIGHT ELBOW - 2 VIEW COMPARISON:  None. FINDINGS: There is no evidence of fracture, dislocation, or joint effusion. There is no evidence of arthropathy or other focal bone abnormality. Soft tissues are unremarkable. IMPRESSION: Negative. Electronically Signed   By: Lajean Manes M.D.   On: 01/25/2020 09:10   CT Head Wo Contrast  Result Date: 01/25/2020 CLINICAL DATA:  Fall this morning.  Dizziness for 4 days. EXAM: CT HEAD WITHOUT CONTRAST CT CERVICAL SPINE WITHOUT CONTRAST TECHNIQUE: Multidetector CT imaging of the head and cervical spine was performed following the standard protocol without intravenous contrast. Multiplanar CT image reconstructions of the cervical spine were also generated. COMPARISON:  04/07/2019 FINDINGS: CT HEAD FINDINGS Brain: No evidence of acute infarction, hemorrhage, hydrocephalus, extra-axial collection or mass lesion/mass effect. There is ventricular and sulcal enlargement reflecting mild diffuse atrophy. Small old white matter lacunar infarct noted in the left frontal lobe. Small lacunar infarct noted in the left central pons. Bilateral periventricular and subcortical white matter hypoattenuation is also present consistent mild to moderate chronic microvascular ischemic change. Vascular: No hyperdense vessel or unexpected calcification. Skull: Normal. Negative for fracture or focal lesion. Sinuses/Orbits: Left maxillary sinus is opacified. There is a defect along the lower lateral wall of the sinus with bone density material within the sinus cavities near its floor. Heterogeneous soft tissue attenuation bulges from the medial aspect of the sinus into the nasal cavity. There is complete opacification of the left frontal sinus and the anterior and several middle left ethmoid air cells. The defect along the lateral left maxillary sinus wall is new since the prior  study. Opacification of the left frontal sinus and ethmoid air cells is also new. Globes and orbits are unremarkable. Other: None. CT CERVICAL SPINE FINDINGS Alignment: Normal. Skull base and vertebrae: No acute fracture. No primary bone lesion or focal pathologic process. Soft tissues and spinal canal: No prevertebral fluid or swelling. No visible canal hematoma. Disc levels: Moderate loss of disc height at C6-C7 with spondylotic disc bulging. Remaining discs relatively well preserved in height. No convincing disc herniation. There are facet degenerative changes greatest on the right from C2-C3 through C5-C6. Upper chest: Small right pleural effusion. Other: None. IMPRESSION: HEAD CT 1. No acute intracranial abnormalities. 2. Significant left-sided sinus disease, which has progressed since the prior CT. Left maxillary sinus is completely opacified and there is a new defect along the lower lateral wall. Active infection, including fungal infection, should be considered. There is also new complete opacification of the left frontal and anterior left ethmoid air cells, consistent with occlusion of the left ostiomeatal complex. CERVICAL CT 1. No fracture or acute skeletal abnormality. 2. Small right pleural effusion. Electronically Signed   By: Lajean Manes M.D.   On: 01/25/2020 09:00   CT Cervical Spine Wo Contrast  Result Date: 01/25/2020 CLINICAL DATA:  Fall this morning.  Dizziness for 4 days. EXAM: CT HEAD WITHOUT CONTRAST CT CERVICAL SPINE WITHOUT CONTRAST TECHNIQUE: Multidetector CT imaging of the head and cervical spine was performed following the standard protocol without intravenous contrast. Multiplanar CT image reconstructions of the cervical spine were also generated. COMPARISON:  04/07/2019 FINDINGS: CT HEAD FINDINGS Brain: No evidence of acute infarction, hemorrhage, hydrocephalus,  extra-axial collection or mass lesion/mass effect. There is ventricular and sulcal enlargement reflecting mild diffuse  atrophy. Small old white matter lacunar infarct noted in the left frontal lobe. Small lacunar infarct noted in the left central pons. Bilateral periventricular and subcortical white matter hypoattenuation is also present consistent mild to moderate chronic microvascular ischemic change. Vascular: No hyperdense vessel or unexpected calcification. Skull: Normal. Negative for fracture or focal lesion. Sinuses/Orbits: Left maxillary sinus is opacified. There is a defect along the lower lateral wall of the sinus with bone density material within the sinus cavities near its floor. Heterogeneous soft tissue attenuation bulges from the medial aspect of the sinus into the nasal cavity. There is complete opacification of the left frontal sinus and the anterior and several middle left ethmoid air cells. The defect along the lateral left maxillary sinus wall is new since the prior study. Opacification of the left frontal sinus and ethmoid air cells is also new. Globes and orbits are unremarkable. Other: None. CT CERVICAL SPINE FINDINGS Alignment: Normal. Skull base and vertebrae: No acute fracture. No primary bone lesion or focal pathologic process. Soft tissues and spinal canal: No prevertebral fluid or swelling. No visible canal hematoma. Disc levels: Moderate loss of disc height at C6-C7 with spondylotic disc bulging. Remaining discs relatively well preserved in height. No convincing disc herniation. There are facet degenerative changes greatest on the right from C2-C3 through C5-C6. Upper chest: Small right pleural effusion. Other: None. IMPRESSION: HEAD CT 1. No acute intracranial abnormalities. 2. Significant left-sided sinus disease, which has progressed since the prior CT. Left maxillary sinus is completely opacified and there is a new defect along the lower lateral wall. Active infection, including fungal infection, should be considered. There is also new complete opacification of the left frontal and anterior left  ethmoid air cells, consistent with occlusion of the left ostiomeatal complex. CERVICAL CT 1. No fracture or acute skeletal abnormality. 2. Small right pleural effusion. Electronically Signed   By: Lajean Manes M.D.   On: 01/25/2020 09:00    Assessment: 69 year old male presenting with seizure recurrence after recent discontinuation of anticonvulsant 1. DDx for presentation primarily consists of seizure recurrence in the context of epilepsy, despite the recent unremarkable EEG, which had resulted in his Keppra being discontinued. Less likely would be a syncopal convulsion. Factors lowering his seizure threshold would be hypomagnesemia and hyponatremia on labs obtained in the ED.  2. Abnormal EKG in conjunction with elevated troponin. Cardiology has been consulted.  3. CT head: No acute intracranial abnormalities. A chronic left pontine infarct is noted, which offers the best explanation for the mild right sided weakness seen on exam.   Recommendations: 1. EEG. 2. Due to QT prolongation with increased risk for torsades de pointes, will need to discontinue Keppra. Replacing with Vimpat 100 mg IV BID (ordered)   3. Outpatient Neurology follow up.  4. When the patient is more lucid, he will need to be informed about outpatient seizure precautions: Per [facility] statutes, patients with seizures are not allowed to drive until  they have been seizure-free for six months. Use caution when using heavy equipment or power tools. Avoid working on ladders or at heights. Take showers instead of baths. Ensure the water temperature is not too high on the home water heater. Do not go swimming alone. When caring for infants or small children, sit down when holding, feeding, or changing them to minimize risk of injury to the child in the event you  have a seizure. Also, Maintain good sleep hygiene. Avoid alcohol.    Electronically signed: Dr. Kerney Elbe 01/25/2020, 11:21 AM

## 2020-01-25 NOTE — Progress Notes (Signed)
eeg done °

## 2020-01-25 NOTE — ED Provider Notes (Signed)
[facility] Emergency Department Provider Note  ____________________________________________   First MD Initiated Contact with Patient 01/25/20 [id number]     (approximate)  I have reviewed the triage vital signs and the nursing notes.   HISTORY  Chief Complaint Dizziness and Fall   HPI Kyle [name] is a 69 y.o. 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  Patient states he has been feeling weak and dizzy for months but felt especially dizzy over the last couple days and fell this morning.  He states he thinks he had a seizure because he "knows what it feels like".  Endorses a chronic cough.  He denies striking his head but does think he lost consciousness.  He states he thinks he had his elbows because he has pain in both elbows after the fall but no new pain in his hands shoulders.  Denies any lower extremity pain he denies any tongue biting or urinary incontinence.  Other than the dizziness he denies any other acute complaints including chest pain, abdominal pain, acute back pain, urinary symptoms, vomiting, diarrhea, or other recent falls.  He denies being on any blood thinners.  Denies EtOH use or illicit drug use.         Past Medical History:  Diagnosis Date  . Anemia   . Anxiety   . BPH (benign prostatic hyperplasia)   . Chronic kidney disease   . Colon polyps   . COPD (chronic obstructive pulmonary disease) ([place])   . Depression   . Diabetes mellitus without complication ([place])   . Diverticulosis of colon   . History of kidney stones   . Hyperlipidemia   . Hypertension   . Hypothyroidism   . Nephrolithiasis   . Seizures ([place])    2015 last  seizure  . Stroke [facility])     Patient Active Problem List   Diagnosis Date Noted  . Seizure ([place]) 01/25/2020  . Acute GI bleeding 07/28/2019  . Melena 07/22/2019  . History of GI bleed 07/22/2019  . Current every day smoker 06/22/2019  . Falls frequently 06/22/2019  . Palliative care by specialist   . Goals of care, counseling/discussion   . Orthostasis 05/02/2019  . ESRD (end stage renal disease) on dialysis ([place])   . Acute blood loss anemia 04/30/2019  . Acute on chronic blood loss anemia 04/29/2019  . History of CVA (cerebrovascular accident) 04/29/2019  . GI bleed 04/04/2019  . 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[place]) 03/20/2019  . Generalized weakness   . Diabetic polyneuropathy associated with type 2 diabetes mellitus ([place])   . Cerebrovascular accident (CVA) ([place])   . PAD (peripheral artery disease) ([place]) 03/17/2019  . Ptosis of eyelid, left 03/15/2019  . Physical deconditioning 03/14/2019  . Hypomagnesemia 03/13/2019  . Hyperphosphatemia 03/13/2019  . ESRD (end stage renal disease) ([place]) 03/13/2019  . Chronic kidney disease with peritoneal dialysis as preferred modality, stage 5 ([place]) 03/13/2019  . Prolonged QT interval 03/13/2019  . Hypertension associated with diabetes ([place]) 03/13/2019  . Hypothyroidism 03/13/2019  . Chronic diarrhea 03/13/2019  . Hypocalcemia 03/12/2019  . Ataxia 03/10/2019  . Dizziness 03/10/2019  . Vision problem 03/10/2019  . Hypokalemia 09/16/2018  . ESRD on peritoneal dialysis (  [place]) 03/04/2018  . Lung nodule 06/12/2016  . Narrowing of airway   . Cigarette smoker 05/24/2016  . Collapse of right lung 05/24/2016  . COPD with chronic bronchitis ([place]) 05/24/2016  . Diarrhea due to malabsorption 07/01/2015  . Hx of adenomatous colonic polyps 07/01/2015  . Acne 05/20/2015  . Calculus of kidney 05/20/2015  . Chest pain, non-cardiac 05/20/2015  . Seizure disorder ([place]) 05/20/2015  . Current tobacco use 05/20/2015  . Chronic kidney disease (CKD), stage III  (moderate) ([place]) 03/29/2015  . Insulin dependent type 2 diabetes mellitus ([place]) 03/15/2015  . Lipoma of shoulder 03/08/2014  . Other synovitis and tenosynovitis, right shoulder 03/08/2014  . Right supraspinatus tenosynovitis 03/08/2014  . Bursitis of elbow 02/15/2014  . Olecranon bursitis of right elbow 02/15/2014  . Absolute anemia 08/08/2013  . 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 Back pain, chronic 08/08/2013  . Chronic obstructive pulmonary disease ([place]) 08/08/2013  . BP (high blood pressure) 08/08/2013  . Hyperlipidemia associated with type 2 diabetes mellitus ([place]) 08/08/2013  . Acne erythematosa 08/08/2013    Past Surgical History:  Procedure Laterality Date  . AV FISTULA PLACEMENT Left 03/30/2019   Procedure: right brachiocephalic fistula creation;  Surgeon: Marty Heck, MD;  Location: [place];  Service: Vascular;  Laterality: Left;  . BIOPSY  04/05/2019   Procedure: BIOPSY;  Surgeon: Wilford Corner, MD;  Location: [place];  Service: Endoscopy;;  . COLONOSCOPY    . COLONOSCOPY WITH PROPOFOL N/A 05/30/2015   Procedure: COLONOSCOPY WITH PROPOFOL;  Surgeon: Manya Silvas, MD;  Location: [facility] ENDOSCOPY;  Service: Endoscopy;  Laterality: N/A;  . COLONOSCOPY WITH PROPOFOL N/A 12/01/2018   Procedure: COLONOSCOPY WITH PROPOFOL;  Surgeon: Tol[place], [name], MD;  Location: [facility] ENDOSCOPY;  Service: Gastroenterology;  Laterality: N/A;  . COLONOSCOPY WITH PROPOFOL N/A 04/09/2019   Procedure: COLONOSCOPY WITH PROPOFOL;  Surgeon: Carol Ada, MD;  Location: [place];  Service: Endoscopy;  Laterality: N/A;  . ENTEROSCOPY N/A 05/05/2019   Procedure: ENTEROSCOPY;  Surgeon: Ronnette Juniper, MD;  Location: [facility] ENDOSCOPY;  Service: Gastroenterology;  Laterality: N/A;  Push enteroscopy  . ENTEROSCOPY N/A 07/28/2019   Procedure: ENTEROSCOPY;  Surgeon: Virgel Manifold, MD;  Location: [facility] ENDOSCOPY;  Service: Endoscopy;  Laterality: N/A;  . ESOPHAGOGASTRODUODENOSCOPY (EGD) WITH  PROPOFOL N/A 04/05/2019   Procedure: ESOPHAGOGASTRODUODENOSCOPY (EGD) WITH PROPOFOL;  Surgeon: Wilford Corner, MD;  Location: [place];  Service: Endoscopy;  Laterality: N/A;  . GIVENS CAPSULE STUDY N/A 05/01/2019   Procedure: GIVENS CAPSULE STUDY;  Surgeon: Ronald Lobo, MD;  Location: [place];  Service: Endoscopy;  Laterality: N/A;  . HOT HEMOSTASIS N/A 04/09/2019   Procedure: HOT HEMOSTASIS (ARGON PLASMA COAGULATION/BICAP);  Surgeon: Carol Ada, MD;  Location: [place];  Service: Endoscopy;  Laterality: N/A;  . HOT HEMOSTASIS N/A 05/05/2019   Procedure: HOT HEMOSTASIS (ARGON PLASMA COAGULATION/BICAP);  Surgeon: Ronnette Juniper, MD;  Location: [place];  Service: Gastroenterology;  Laterality: N/A;  . INSERTION OF DIALYSIS CATHETER Right 03/30/2019   Procedure: Ultrasound guided right internal jugular tunneled dialysis catheter placement;  Surgeon: Marty Heck, MD;  Location: [facility] OR;  Service: Vascular;  Laterality: Right;  . kidney stone    . REMOVAL OF A DIALYSIS CATHETER Right 06/26/2019   Procedure: REMOVAL OF A DIALYSIS CATHETER ( PD CATH REMOVAL AND I J CATH REMOVAL;  Surgeon: Katha Cabal, MD;  Location: [facility] ORS;  Service: Vascular;  Laterality: Right;  . THYROIDECTOMY    . VARICOCELE EXCISION    .  VIDEO BRONCHOSCOPY Bilateral 05/25/2016   Procedure: VIDEO BRONCHOSCOPY WITHOUT FLUORO;  Surgeon: Juanito Doom, MD;  Location: [facility] ENDOSCOPY;  Service: Cardiopulmonary;  Laterality: Bilateral;    Prior to Admission medications   Medication Sig Start Date End Date Taking? Authorizing Provider  atorvastatin (LIPITOR) 80 MG tablet Take 1 tablet (80 mg total) by mouth at bedtime. 05/07/19 07/22/19  Aline August, MD  B Complex-C-Folic Acid (NEPHRO-VITE PO) Take 1 tablet by mouth daily.    [provider]  calcium acetate (PHOSLO) 667 MG capsule Take 2 capsules (1,334 mg total) by mouth 3 (three) times daily with meals. Patient taking differently: Take  1,334 mg by mouth in the morning and at bedtime.  03/20/19   Loletha Grayer, MD  clopidogrel (PLAVIX) 75 MG tablet Take 75 mg by mouth daily.    [provider]  escitalopram (LEXAPRO) 10 MG tablet Take 10 mg by mouth daily. 07/14/19   [provider]  hydrOXYzine (ATARAX/VISTARIL) 25 MG tablet Take 25 mg by mouth every 6 (six) hours as needed for itching.    [provider]  insulin glargine (LANTUS) 100 UNIT/ML injection Inject 15 Units into the skin at bedtime.     [provider]  levETIRAcetam (KEPPRA) 500 MG tablet Take 500 mg by mouth 2 (two) times a day. 09/05/15   [provider]  levothyroxine (SYNTHROID) 200 MCG tablet Take 200 mcg by mouth daily before breakfast.  02/25/19   [provider]  lidocaine-prilocaine (EMLA) cream Apply 1 application topically Every Tuesday,Thursday,and Saturday with dialysis.     [provider]  losartan (COZAAR) 100 MG tablet Take 100 mg by mouth daily. 07/14/19   [provider]  metoprolol succinate (TOPROL-XL) 50 MG 24 hr tablet Take 1 tablet (50 mg total) by mouth daily. Take with or immediately following a meal. Patient not taking: Reported on 07/22/2019 04/17/19 06/25/19  Kayleen Memos, DO  Pancrelipase, Lip-Prot-Amyl, 24000-76000 units CPEP Take 1 capsule by mouth 3 (three) times daily with meals.     [provider]  pantoprazole (PROTONIX) 40 MG tablet Take 1 tablet (40 mg total) by mouth 2 (two) times daily. 04/16/19 07/22/19  Kayleen Memos, DO  polyethylene glycol powder (GLYCOLAX/MIRALAX) 17 GM/SCOOP powder Take 17 g by mouth daily as needed for moderate constipation.     [provider]  potassium chloride SA (KLOR-CON) 20 MEQ tablet Take 20 mEq by mouth 2 (two) times daily.    [provider]  traZODone (DESYREL) 100 MG tablet Take 100 mg by mouth at bedtime. 07/14/19   [provider]    Allergies Patient has no known allergies.  Family  History  Problem Relation Age of Onset  . Diabetes Mother   . Diabetes Maternal Grandmother   . Diabetes Maternal Grandfather   . Lung cancer Father   . Emphysema Paternal Grandfather     Social History Social History   Tobacco Use  . Smoking status: Light Tobacco Smoker    Packs/day: 0.25    Years: 40.00    Pack years: 10.00    Types: Cigarettes    Last attempt to quit: 05/25/2016    Years since quitting: 3.6  . Smokeless tobacco: Never Used  Vaping Use  . Vaping Use: Never used  Substance Use Topics  . Alcohol use: Yes    Alcohol/week: 8.0 standard drinks    Types: 8 Cans of beer per week    Comment: beer or wine   .  Drug use: No    Review of Systems  Review of Systems  Constitutional: Negative for chills and fever.  HENT: Negative for sore throat.   Eyes: Negative for pain.  Respiratory: Negative for cough and stridor.   Cardiovascular: Negative for chest pain.  Gastrointestinal: Negative for vomiting.  Genitourinary: Negative for dysuria.  Musculoskeletal: Negative for myalgias.  Skin: Negative for rash.  Neurological: Positive for dizziness, seizures and loss of consciousness. Negative for headaches.  Psychiatric/Behavioral: Negative for suicidal ideas.  All other systems reviewed and are negative.     ____________________________________________   PHYSICAL EXAM:  VITAL SIGNS: ED Triage Vitals  Enc Vitals Group     BP      Pulse      Resp      Temp      Temp src      SpO2      Weight      Height      Head Circumference      Peak Flow      Pain Score      Pain Loc      Pain Edu?      Excl. in [place]?    Vitals:   01/25/20 0822 01/25/20 0830  BP: (!) 150/77 (!) 146/77  Pulse: 78 79  Resp: 16 19  Temp: (!) 97.4 F (36.3 C)   SpO2: 100%    Physical Exam Vitals and nursing note reviewed.  Constitutional:      Appearance: He is well-developed.  HENT:     Head: Normocephalic and atraumatic.     Right Ear: External ear normal.     Left  Ear: External ear normal.     Nose: Nose normal.  Eyes:     Conjunctiva/sclera: Conjunctivae normal.  Cardiovascular:     Rate and Rhythm: Normal rate and regular rhythm.     Heart sounds: No murmur heard.   Pulmonary:     Effort: Pulmonary effort is normal. No respiratory distress.     Breath sounds: Normal breath sounds.  Abdominal:     Palpations: Abdomen is soft.     Tenderness: There is no abdominal tenderness.  Musculoskeletal:     Cervical back: Neck supple.  Skin:    General: Skin is warm and dry.  Neurological:     Mental Status: He is alert and oriented to person, place, and time.  Psychiatric:        Mood and Affect: Mood normal.     Patient has skin tears over his bilateral lateral posterior elbows.  2+ bilateral radial and DP pulses.  PERRLA.  EOMI.  Patient is oriented x4.  No pronator drift.  Patient has mild bilateral upper extremity tremor.  He is slightly weaker on the left hemibody compared to the right but sensation is intact light touch of all extremities. ____________________________________________   LABS (all labs ordered are listed, but only abnormal results are displayed)  Labs Reviewed  CBC WITH DIFFERENTIAL/PLATELET - Abnormal; Notable for the following components:      Result Value   RBC 3.77 (*)    Hemoglobin 11.7 (*)    HCT 37.4 (*)    RDW 17.2 (*)    Abs Immature Granulocytes 0.11 (*)    All other components within normal limits  COMPREHENSIVE METABOLIC PANEL - Abnormal; Notable for the following components:   Sodium 131 (*)    Potassium 6.0 (*)    Chloride 91 (*)    Glucose, Bld 235 (*)  BUN 43 (*)    Creatinine, Ser 7.70 (*)    Calcium 7.9 (*)    GFR, Estimated 7 (*)    Anion gap 16 (*)    All other components within normal limits  MAGNESIUM - Abnormal; Notable for the following components:   Magnesium 1.4 (*)    All other components within normal limits  BRAIN NATRIURETIC PEPTIDE - Abnormal; Notable for the following  components:   B Natriuretic Peptide 2,916.1 (*)    All other components within normal limits  TROPONIN I (HIGH SENSITIVITY) - Abnormal; Notable for the following components:   Troponin I (High Sensitivity) 29 (*)    All other components within normal limits  RESP PANEL BY RT-PCR (FLU A&B, COVID) [id number]  HEMOGLOBIN A1C   ____________________________________________  EKG  Sinus rhythm with a ventricular rate of 79, right bundle branch block and left anterior fascicle block as well as some T wave inversions in anterior leads and ST depression in lead V4 and V5 with some nonspecific changes in the inferior leads and lead I. ____________________________________________  RADIOLOGY  ED MD interpretation: Chest x-ray is unremarkable for clear focal consolidation, overt edema, large effusion.  Remote rib fracture noted and some atelectasis.  Bilateral elbow plain films unremarkable.  CT head shows no evidence of acute intracranial hemorrhage.  CT C-spine shows no evidence of acute C-spine injury.  Official radiology report(s): DG Chest 1 View  Result Date: 01/25/2020 CLINICAL DATA:  Fall/syncope. EXAM: CHEST  1 VIEW COMPARISON:  May 02, 2019. FINDINGS: Similar mildly enlarged cardiac silhouette. Aortic atherosclerosis. No consolidation. Streaky bibasilar opacities. No visible pleural effusions or pneumothorax. Multiple healing right rib fractures. Remote left rib fracture. Right thoracic inlet clips. IMPRESSION: 1. Streaky bibasilar opacities, favor atelectasis. Aspiration or pneumonia is not excluded. 2. Healing right rib fractures. Electronically Signed   By: Margaretha Sheffield MD   On: 01/25/2020 09:14   DG Elbow 2 Views Left  Result Date: 01/25/2020 CLINICAL DATA:  [place] this morning.  Bilateral elbow pain. EXAM: LEFT ELBOW - 2 VIEW COMPARISON:  None. FINDINGS: No fracture or bone lesion. Elbow joint is normally spaced and aligned.  No joint effusion. There are anterior surgical vascular  clips. IMPRESSION: No fracture or elbow joint abnormality Electronically Signed   By: Lajean Manes M.D.   On: 01/25/2020 09:09   DG Elbow 2 Views Right  Result Date: 01/25/2020 CLINICAL DATA:  Dizziness for 4 days. Fell this morning. Bilateral elbow pain. EXAM: RIGHT ELBOW - 2 VIEW COMPARISON:  None. FINDINGS: There is no evidence of fracture, dislocation, or joint effusion. There is no evidence of arthropathy or other focal bone abnormality. Soft tissues are unremarkable. IMPRESSION: Negative. Electronically Signed   By: Lajean Manes M.D.   On: 01/25/2020 09:10   CT Head Wo Contrast  Result Date: 01/25/2020 CLINICAL DATA:  Fall this morning.  Dizziness for 4 days. EXAM: CT HEAD WITHOUT CONTRAST CT CERVICAL SPINE WITHOUT CONTRAST TECHNIQUE: Multidetector CT imaging of the head and cervical spine was performed following the standard protocol without intravenous contrast. Multiplanar CT image reconstructions of the cervical spine were also generated. COMPARISON:  04/07/2019 FINDINGS: CT HEAD FINDINGS Brain: No evidence of acute infarction, hemorrhage, hydrocephalus, extra-axial collection or mass lesion/mass effect. There is ventricular and sulcal enlargement reflecting mild diffuse atrophy. Small old white matter lacunar infarct noted in the left frontal lobe. Small lacunar infarct noted in the left central pons. Bilateral periventricular and subcortical white matter hypoattenuation is also present consistent  mild to moderate chronic microvascular ischemic change. Vascular: No hyperdense vessel or unexpected calcification. Skull: Normal. Negative for fracture or focal lesion. Sinuses/Orbits: Left maxillary sinus is opacified. There is a defect along the lower lateral wall of the sinus with bone density material within the sinus cavities near its floor. Heterogeneous soft tissue attenuation bulges from the medial aspect of the sinus into the nasal cavity. There is complete opacification of the left frontal  sinus and the anterior and several middle left ethmoid air cells. The defect along the lateral left maxillary sinus wall is new since the prior study. Opacification of the left frontal sinus and ethmoid air cells is also new. Globes and orbits are unremarkable. Other: None. CT CERVICAL SPINE FINDINGS Alignment: Normal. Skull base and vertebrae: No acute fracture. No primary bone lesion or focal pathologic process. Soft tissues and spinal canal: No prevertebral fluid or swelling. No visible canal hematoma. Disc levels: Moderate loss of disc height at C6-C7 with spondylotic disc bulging. Remaining discs relatively well preserved in height. No convincing disc herniation. There are facet degenerative changes greatest on the right from C2-C3 through C5-C6. Upper chest: Small right pleural effusion. Other: None. IMPRESSION: HEAD CT 1. No acute intracranial abnormalities. 2. Significant left-sided sinus disease, which has progressed since the prior CT. Left maxillary sinus is completely opacified and there is a new defect along the lower lateral wall. Active infection, including fungal infection, should be considered. There is also new complete opacification of the left frontal and anterior left ethmoid air cells, consistent with occlusion of the left ostiomeatal complex. CERVICAL CT 1. No fracture or acute skeletal abnormality. 2. Small right pleural effusion. Electronically Signed   By: Lajean Manes M.D.   On: 01/25/2020 09:00   CT Cervical Spine Wo Contrast  Result Date: 01/25/2020 CLINICAL DATA:  Fall this morning.  Dizziness for 4 days. EXAM: CT HEAD WITHOUT CONTRAST CT CERVICAL SPINE WITHOUT CONTRAST TECHNIQUE: Multidetector CT imaging of the head and cervical spine was performed following the standard protocol without intravenous contrast. Multiplanar CT image reconstructions of the cervical spine were also generated. COMPARISON:  04/07/2019 FINDINGS: CT HEAD FINDINGS Brain: No evidence of acute infarction,  hemorrhage, hydrocephalus, extra-axial collection or mass lesion/mass effect. There is ventricular and sulcal enlargement reflecting mild diffuse atrophy. Small old white matter lacunar infarct noted in the left frontal lobe. Small lacunar infarct noted in the left central pons. Bilateral periventricular and subcortical white matter hypoattenuation is also present consistent mild to moderate chronic microvascular ischemic change. Vascular: No hyperdense vessel or unexpected calcification. Skull: Normal. Negative for fracture or focal lesion. Sinuses/Orbits: Left maxillary sinus is opacified. There is a defect along the lower lateral wall of the sinus with bone density material within the sinus cavities near its floor. Heterogeneous soft tissue attenuation bulges from the medial aspect of the sinus into the nasal cavity. There is complete opacification of the left frontal sinus and the anterior and several middle left ethmoid air cells. The defect along the lateral left maxillary sinus wall is new since the prior study. Opacification of the left frontal sinus and ethmoid air cells is also new. Globes and orbits are unremarkable. Other: None. CT CERVICAL SPINE FINDINGS Alignment: Normal. Skull base and vertebrae: No acute fracture. No primary bone lesion or focal pathologic process. Soft tissues and spinal canal: No prevertebral fluid or swelling. No visible canal hematoma. Disc levels: Moderate loss of disc height at C6-C7 with spondylotic disc bulging. Remaining discs relatively well preserved in  height. No convincing disc herniation. There are facet degenerative changes greatest on the right from C2-C3 through C5-C6. Upper chest: Small right pleural effusion. Other: None. IMPRESSION: HEAD CT 1. No acute intracranial abnormalities. 2. Significant left-sided sinus disease, which has progressed since the prior CT. Left maxillary sinus is completely opacified and there is a new defect along the lower lateral wall.  Active infection, including fungal infection, should be considered. There is also new complete opacification of the left frontal and anterior left ethmoid air cells, consistent with occlusion of the left ostiomeatal complex. CERVICAL CT 1. No fracture or acute skeletal abnormality. 2. Small right pleural effusion. Electronically Signed   By: Lajean Manes M.D.   On: 01/25/2020 09:00    ____________________________________________   PROCEDURES  Procedure(s) performed (including Critical Care):  .1-3 Lead EKG Interpretation Performed by: Lucrezia Starch, MD Authorized by: Lucrezia Starch, MD     Interpretation: abnormal     ECG rate assessment: normal     Rhythm: sinus rhythm     Conduction: abnormal   Comments:     RBBB     ____________________________________________   INITIAL IMPRESSION / ASSESSMENT AND PLAN / ED COURSE        Patient presents with [place] to history exam complaining of dizziness for several days as well as concern for possible seizure earlier this morning and fall.  On arrival patient not appear postictal slightly hypertensive otherwise stable vital signs on room air.  Aside from some subtle left hemibody weakness no other clear focal deficits on exam.  On review of records these are noted on prior notes from prior stroke.  Unclear from patient's history exam of patient suffered a seizure earlier today or syncopized as well as etiology of his dizziness which on review of records appears to go back several months at least.  His EKG does show some T wave inversions and ST depression in V4 which appear new when compared to prior.  In addition his troponin is elevated 29 and is unclear if this reflects an NSTEMI versus some demand ischemia.  ASA was ordered and cardiology consulted.   CMP remarkable for hyponatremia, hyperkalemia, hyper glycemia with a bicarb of 24 and a creatinine of 7.7 compared to 6.175 months ago was unremarkable LFTs.  Magnesium is low at 1.4.   CBC unremarkable.  Given hyperkalemia as well as QRS complex is widened patient was given calcium and insulin.  I did discuss patient with on-call nephrologist Dr. Candiss Norse who stated he would arrange for patient to be dialyzed today.  While undergoing ED work-up patient was noted to have some abnormal movements of the upper extremities by staff.  These lasted less than 1 minute and resolved without any intervention however on my assessment patient is somewhat somnolent and appears to have bit his lip.  Given concern for possible seizure he was loaded with Keppra and neurology was consulted.   Patient will be admitted to medicine service for further evaluation management.   ____________________________________________   FINAL CLINICAL IMPRESSION(S) / ED DIAGNOSES  Final diagnoses:  Seizure ([place])  Hyperkalemia  Hypomagnesemia  Troponin I above reference range  Hyperglycemia  ESRD (end stage renal disease) (HCC)  Abnormal ECG    Medications  aspirin suppository 300 mg (has no administration in time range)  calcium gluconate 1 g/ 50 mL sodium chloride IVPB (1,000 mg Intravenous New Bag/Given 01/25/20 0930)  insulin aspart (novoLOG) injection 0-15 Units (has no administration in time range)  magnesium sulfate  IVPB 2 g 50 mL (has no administration in time range)  levETIRAcetam (KEPPRA) IVPB 1500 mg/ 100 mL premix (0 mg Intravenous Stopped 01/25/20 8657)     ED Discharge Orders    None       Note:  This document was prepared using Dragon voice recognition software and may include unintentional dictation errors.   Lucrezia Starch, MD 01/25/20 [phone]

## 2020-01-25 NOTE — H&P (Addendum)
History and Physical    Kyle [name] WYO:[id number] DOB: [birth year] DOA: 01/25/2020  PCP: Idelle Crouch, MD   Patient coming from: Home  I have personally briefly reviewed patient's old medical records in [place]  Chief Complaint: "I blacked out"  Most of the history is obtained from patient's wife Ms. Kyle [name] over the phone as patient is postictal and unable to provide any history  HPI: Kyle [name] is a 69 y.o. male with medical history significant for CVA with left-sided weakness, end-stage renal disease on hemodialysis (M/W/F), history of seizure disorder, diabetes mellitus, hypertension who was brought into the emergency room by EMS for evaluation of a fall that occurred on the morning of his admission and concern for seizure per patient.   Patient's wife states that he did not feel well the night prior to his admission and went to bed early.  She heard him fall this morning and when she got downstairs she found him on the floor.  Patient had a coffee cup in his hands that broke when he fell.  His wife states that he told her he had blacked out.  Patient thought he had a seizure because he knows what it feels like.  EMS was called and patient was transported to the emergency room.  While in the emergency room patient was noted to have seizure-like activity involving his upper extremities lasting about a minute and had a tongue laceration.  Patient was loaded with Keppra 1.5 g IV and at the time of this history and physical is postictal and unable to provide any history. His wife states that he had an EEG done recently by his neurologist and that his Keppra was discontinued because the EEG did not show any evidence of seizures. I am unable to do a review of systems on this patient. Labs show sodium 131, potassium 6.0, chloride 91, bicarb 24, glucose 235, BUN 43, creatinine 7.70, calcium 7.9, magnesium 1.4, alkaline phosphatase 93, albumin 3.5, AST 16, ALT 7,  total protein 6.8, total bilirubin 1.2, BNP 2000 916, troponin XX 9, white count 7.8, hemoglobin 11.7, hematocrit 37.4, MCV 99.2, RDW 17.2, platelet count 170 CT scan of the head without contrast shows no acute intracranial abnormalities.  Significant left-sided sinus disease, which has progressed since the prior CT.  Left maxillary sinus is completely opacified and there is a new defect along the lower lateral wall.  Active infection including a fungal infection should be considered.  There is also a new complete opacification of the left frontal and anterior left ethmoid air cells, consistent with occlusion of the left ostiomeatal complex. Cervical spine CT shows no fracture or acute skeletal abnormality.  Small right pleural effusion. Chest x-ray reviewed by me shows streaky bibasilar opacities.  Healing right rib fractures. Left elbow x-ray shows no fracture or elbow joint abnormality Right elbow x-ray is negative 12 EKG reviewed by me shows sinus rhythm with a right bundle branch block and T wave inversions in the lateral leads.     ED Course: Patient is a 69 year old Caucasian male who was brought into the ER by EMS for evaluation after he had a fall at home of unclear etiology.  Patient was concerned that he may have had a seizure and does have a history of seizure disorder but his Keppra was recently discontinued by his neurologist.  His labs revealed hyperkalemia and patient received calcium gluconate and insulin IV in the ER.  He is scheduled for hemodialysis today.  While in the ER he had a witnessed seizure-like activity and is currently postictal.  He received a dose of Keppra in the emergency room.  He will be admitted to the hospital for further evaluation.  Review of Systems: As per HPI otherwise 10 point review of systems negative.    Past Medical History:  Diagnosis Date  . Anemia   . Anxiety   . BPH (benign prostatic hyperplasia)   . Chronic kidney disease   . Colon polyps    . COPD (chronic obstructive pulmonary disease) ([place])   . Depression   . Diabetes mellitus without complication ([place])   . Diverticulosis of colon   . History of kidney stones   . Hyperlipidemia   . Hypertension   . Hypothyroidism   . Nephrolithiasis   . Seizures ([place])    2015 last seizure  . Stroke [facility])     Past Surgical History:  Procedure Laterality Date  . AV FISTULA PLACEMENT Left 03/30/2019   Procedure: right brachiocephalic fistula creation;  Surgeon: Marty Heck, MD;  Location: [place];  Service: Vascular;  Laterality: Left;  . BIOPSY  04/05/2019   Procedure: BIOPSY;  Surgeon: Wilford Corner, MD;  Location: [place];  Service: Endoscopy;;  . COLONOSCOPY    . COLONOSCOPY WITH PROPOFOL N/A 05/30/2015   Procedure: COLONOSCOPY WITH PROPOFOL;  Surgeon: Manya Silvas, MD;  Location: [facility] ENDOSCOPY;  Service: Endoscopy;  Laterality: N/A;  . COLONOSCOPY WITH PROPOFOL N/A 12/01/2018   Procedure: COLONOSCOPY WITH PROPOFOL;  Surgeon: Toledo, [name], MD;  Location: [facility] ENDOSCOPY;  Service: Gastroenterology;  Laterality: N/A;  . COLONOSCOPY WITH PROPOFOL N/A 04/09/2019   Procedure: COLONOSCOPY WITH PROPOFOL;  Surgeon: Carol Ada, MD;  Location: [place];  Service: Endoscopy;  Laterality: N/A;  . ENTEROSCOPY N/A 05/05/2019   Procedure: ENTEROSCOPY;  Surgeon: Ronnette Juniper, MD;  Location: [facility] ENDOSCOPY;  Service: Gastroenterology;  Laterality: N/A;  Push enteroscopy  . ENTEROSCOPY N/A 07/28/2019   Procedure: ENTEROSCOPY;  Surgeon: Virgel Manifold, MD;  Location: [facility] ENDOSCOPY;  Service: Endoscopy;  Laterality: N/A;  . ESOPHAGOGASTRODUODENOSCOPY (EGD) WITH PROPOFOL N/A 04/05/2019   Procedure: ESOPHAGOGASTRODUODENOSCOPY (EGD) WITH PROPOFOL;  Surgeon: Wilford Corner, MD;  Location: [place];  Service: Endoscopy;  Laterality: N/A;  . GIVENS CAPSULE STUDY N/A 05/01/2019   Procedure: GIVENS CAPSULE STUDY;  Surgeon: Ronald Lobo, MD;  Location: [place];  Service:  Endoscopy;  Laterality: N/A;  . HOT HEMOSTASIS N/A 04/09/2019   Procedure: HOT HEMOSTASIS (ARGON PLASMA COAGULATION/BICAP);  Surgeon: Carol Ada, MD;  Location: [place];  Service: Endoscopy;  Laterality: N/A;  . HOT HEMOSTASIS N/A 05/05/2019   Procedure: HOT HEMOSTASIS (ARGON PLASMA COAGULATION/BICAP);  Surgeon: Ronnette Juniper, MD;  Location: [place];  Service: Gastroenterology;  Laterality: N/A;  . INSERTION OF DIALYSIS CATHETER Right 03/30/2019   Procedure: Ultrasound guided right internal jugular tunneled dialysis catheter placement;  Surgeon: Marty Heck, MD;  Location: [facility] OR;  Service: Vascular;  Laterality: Right;  . kidney stone    . REMOVAL OF A DIALYSIS CATHETER Right 06/26/2019   Procedure: REMOVAL OF A DIALYSIS CATHETER ( PD CATH REMOVAL AND I J CATH REMOVAL;  Surgeon: Katha Cabal, MD;  Location: [facility] ORS;  Service: Vascular;  Laterality: Right;  . THYROIDECTOMY    . VARICOCELE EXCISION    . VIDEO BRONCHOSCOPY Bilateral 05/25/2016   Procedure: VIDEO BRONCHOSCOPY WITHOUT FLUORO;  Surgeon: Juanito Doom, MD;  Location: [facility] ENDOSCOPY;  Service: Cardiopulmonary;  Laterality: Bilateral;  reports that he has been smoking cigarettes. He has a 10.00 pack-year smoking history. He has never used smokeless tobacco. He reports current alcohol use of about 8.0 standard drinks of alcohol per week. He reports that he does not use drugs.  No Known Allergies  Family History  Problem Relation Age of Onset  . Diabetes Mother   . Diabetes Maternal Grandmother   . Diabetes Maternal Grandfather   . Lung cancer Father   . Emphysema Paternal Grandfather      Prior to Admission medications   Medication Sig Start Date End Date Taking? Authorizing Provider  atorvastatin (LIPITOR) 80 MG tablet Take 1 tablet (80 mg total) by mouth at bedtime. 05/07/19 07/22/19  Aline August, MD  B Complex-C-Folic Acid (NEPHRO-VITE PO) Take 1 tablet by mouth daily.    [provider]   calcium acetate (PHOSLO) 667 MG capsule Take 2 capsules (1,334 mg total) by mouth 3 (three) times daily with meals. Patient taking differently: Take 1,334 mg by mouth in the morning and at bedtime.  03/20/19   Loletha Grayer, MD  clopidogrel (PLAVIX) 75 MG tablet Take 75 mg by mouth daily.    [provider]  escitalopram (LEXAPRO) 10 MG tablet Take 10 mg by mouth daily. 07/14/19   [provider]  hydrOXYzine (ATARAX/VISTARIL) 25 MG tablet Take 25 mg by mouth every 6 (six) hours as needed for itching.    [provider]  insulin glargine (LANTUS) 100 UNIT/ML injection Inject 15 Units into the skin at bedtime.     [provider]  levETIRAcetam (KEPPRA) 500 MG tablet Take 500 mg by mouth 2 (two) times a day. 09/05/15   [provider]  levothyroxine (SYNTHROID) 200 MCG tablet Take 200 mcg by mouth daily before breakfast.  02/25/19   [provider]  lidocaine-prilocaine (EMLA) cream Apply 1 application topically Every Tuesday,Thursday,and Saturday with dialysis.     [provider]  losartan (COZAAR) 100 MG tablet Take 100 mg by mouth daily. 07/14/19   [provider]  metoprolol succinate (TOPROL-XL) 50 MG 24 hr tablet Take 1 tablet (50 mg total) by mouth daily. Take with or immediately following a meal. Patient not taking: Reported on 07/22/2019 04/17/19 06/25/19  Kayleen Memos, DO  Pancrelipase, Lip-Prot-Amyl, 24000-76000 units CPEP Take 1 capsule by mouth 3 (three) times daily with meals.     [provider]  pantoprazole (PROTONIX) 40 MG tablet Take 1 tablet (40 mg total) by mouth 2 (two) times daily. 04/16/19 07/22/19  Kayleen Memos, DO  polyethylene glycol powder (GLYCOLAX/MIRALAX) 17 GM/SCOOP powder Take 17 g by mouth daily as needed for moderate constipation.     [provider]  potassium chloride SA (KLOR-CON) 20 MEQ tablet Take 20 mEq by mouth 2 (two) times daily.    [provider]  traZODone  (DESYREL) 100 MG tablet Take 100 mg by mouth at bedtime. 07/14/19   [provider]    Physical Exam: Vitals:   01/25/20 0830 01/25/20 0834 01/25/20 0915 01/25/20 0930  BP: (!) 146/77  (!) 166/89 (!) 162/86  Pulse: 79  84 79  Resp: 19  17 16   Temp:      TempSrc:      SpO2:      Weight:  79.8 kg    Height:  6' (1.829 m)       Vitals:   01/25/20 0830 01/25/20 0834 01/25/20 0915 01/25/20 0930  BP: (!) 146/77  (!) 166/89 (!) 162/86  Pulse: 79  84 79  Resp: 19  17 16   Temp:      TempSrc:      SpO2:      Weight:  79.8 kg    Height:  6' (1.829 m)      Constitutional: Patient is lethargic and postictal.  Opens eyes to loud verbal stimuli but unable to follow commands Eyes: PERRL, lids and conjunctivae normal ENMT: Mucous membranes are moist.  Neck: normal, supple, no masses, no thyromegaly Respiratory: clear to auscultation bilaterally, no wheezing, no crackles. Normal respiratory effort. No accessory muscle use.  Cardiovascular: Regular rate and rhythm, no murmurs / rubs / gallops. No extremity edema. 2+ pedal pulses. No carotid bruits.  Abdomen: no tenderness, no masses palpated. No hepatosplenomegaly. Bowel sounds positive.  Musculoskeletal: no clubbing / cyanosis. No joint deformity upper and lower extremities.  Skin: no rashes, lesions, ulcers.  Neurologic: Unable to assess. Has spontaneous movement of his extremities Psychiatric: Unable to assess   Labs on Admission: I have personally reviewed following labs and imaging studies  CBC: Recent Labs  Lab 01/25/20 0816  WBC 7.8  NEUTROABS 6.2  HGB 11.7*  HCT 37.4*  MCV 99.2  PLT 322   Basic Metabolic Panel: Recent Labs  Lab 01/25/20 0816  NA 131*  K 6.0*  CL 91*  CO2 24  GLUCOSE 235*  BUN 43*  CREATININE 7.70*  CALCIUM 7.9*  MG 1.4*   GFR: Estimated Creatinine Clearance: 9.7 mL/min (A) (by C-G formula based on SCr of 7.7 mg/dL (H)). Liver Function Tests: Recent Labs  Lab 01/25/20 0816  AST  16  ALT 7  ALKPHOS 93  BILITOT 1.2  PROT 6.8  ALBUMIN 3.5   No results for input(s): LIPASE, AMYLASE in the last 168 hours. No results for input(s): AMMONIA in the last 168 hours. Coagulation Profile: No results for input(s): INR, PROTIME in the last 168 hours. Cardiac Enzymes: No results for input(s): CKTOTAL, CKMB, CKMBINDEX, TROPONINI in the last 168 hours. BNP (last 3 results) No results for input(s): PROBNP in the last 8760 hours. HbA1C: No results for input(s): HGBA1C in the last 72 hours. CBG: No results for input(s): GLUCAP in the last 168 hours. Lipid Profile: No results for input(s): CHOL, HDL, LDLCALC, TRIG, CHOLHDL, LDLDIRECT in the last 72 hours. Thyroid Function Tests: No results for input(s): TSH, T4TOTAL, FREET4, T3FREE, THYROIDAB in the last 72 hours. Anemia Panel: No results for input(s): VITAMINB12, FOLATE, FERRITIN, TIBC, IRON, RETICCTPCT in the last 72 hours. Urine analysis:    Component Value Date/Time   COLORURINE YELLOW 03/22/2019 1622   APPEARANCEUR CLEAR 03/22/2019 1622   APPEARANCEUR Clear 08/08/2013 1947   LABSPEC 1.018 03/22/2019 1622   LABSPEC 1.012 08/08/2013 1947   PHURINE 5.0 03/22/2019 1622   GLUCOSEU >=500 (A) 03/22/2019 1622   GLUCOSEU 50 mg/dL 08/08/2013 1947   HGBUR NEGATIVE 03/22/2019 [place] 03/22/2019 1622   BILIRUBINUR Negative 08/08/2013 1947   KETONESUR NEGATIVE 03/22/2019 1622   PROTEINUR >=300 (A) 03/22/2019 1622   NITRITE NEGATIVE 03/22/2019 1622   LEUKOCYTESUR NEGATIVE 03/22/2019 1622   LEUKOCYTESUR Negative 08/08/2013 1947    Radiological Exams on Admission: DG Chest 1 View  Result Date: 01/25/2020 CLINICAL DATA:  Fall/syncope. EXAM: CHEST  1 VIEW COMPARISON:  May 02, 2019. FINDINGS: Similar mildly enlarged cardiac silhouette. Aortic atherosclerosis. No consolidation. Streaky bibasilar opacities. No visible pleural effusions or pneumothorax. Multiple healing right rib fractures. Remote left rib  fracture. Right thoracic inlet clips. IMPRESSION:  1. Streaky bibasilar opacities, favor atelectasis. Aspiration or pneumonia is not excluded. 2. Healing right rib fractures. Electronically Signed   By: Margaretha Sheffield MD   On: 01/25/2020 09:14   DG Elbow 2 Views Left  Result Date: 01/25/2020 CLINICAL DATA:  [place] this morning.  Bilateral elbow pain. EXAM: LEFT ELBOW - 2 VIEW COMPARISON:  None. FINDINGS: No fracture or bone lesion. Elbow joint is normally spaced and aligned.  No joint effusion. There are anterior surgical vascular clips. IMPRESSION: No fracture or elbow joint abnormality Electronically Signed   By: Lajean Manes M.D.   On: 01/25/2020 09:09   DG Elbow 2 Views Right  Result Date: 01/25/2020 CLINICAL DATA:  Dizziness for 4 days. Fell this morning. Bilateral elbow pain. EXAM: RIGHT ELBOW - 2 VIEW COMPARISON:  None. FINDINGS: There is no evidence of fracture, dislocation, or joint effusion. There is no evidence of arthropathy or other focal bone abnormality. Soft tissues are unremarkable. IMPRESSION: Negative. Electronically Signed   By: Lajean Manes M.D.   On: 01/25/2020 09:10   CT Head Wo Contrast  Result Date: 01/25/2020 CLINICAL DATA:  Fall this morning.  Dizziness for 4 days. EXAM: CT HEAD WITHOUT CONTRAST CT CERVICAL SPINE WITHOUT CONTRAST TECHNIQUE: Multidetector CT imaging of the head and cervical spine was performed following the standard protocol without intravenous contrast. Multiplanar CT image reconstructions of the cervical spine were also generated. COMPARISON:  04/07/2019 FINDINGS: CT HEAD FINDINGS Brain: No evidence of acute infarction, hemorrhage, hydrocephalus, extra-axial collection or mass lesion/mass effect. There is ventricular and sulcal enlargement reflecting mild diffuse atrophy. Small old white matter lacunar infarct noted in the left frontal lobe. Small lacunar infarct noted in the left central pons. Bilateral periventricular and subcortical white matter  hypoattenuation is also present consistent mild to moderate chronic microvascular ischemic change. Vascular: No hyperdense vessel or unexpected calcification. Skull: Normal. Negative for fracture or focal lesion. Sinuses/Orbits: Left maxillary sinus is opacified. There is a defect along the lower lateral wall of the sinus with bone density material within the sinus cavities near its floor. Heterogeneous soft tissue attenuation bulges from the medial aspect of the sinus into the nasal cavity. There is complete opacification of the left frontal sinus and the anterior and several middle left ethmoid air cells. The defect along the lateral left maxillary sinus wall is new since the prior study. Opacification of the left frontal sinus and ethmoid air cells is also new. Globes and orbits are unremarkable. Other: None. CT CERVICAL SPINE FINDINGS Alignment: Normal. Skull base and vertebrae: No acute fracture. No primary bone lesion or focal pathologic process. Soft tissues and spinal canal: No prevertebral fluid or swelling. No visible canal hematoma. Disc levels: Moderate loss of disc height at C6-C7 with spondylotic disc bulging. Remaining discs relatively well preserved in height. No convincing disc herniation. There are facet degenerative changes greatest on the right from C2-C3 through C5-C6. Upper chest: Small right pleural effusion. Other: None. IMPRESSION: HEAD CT 1. No acute intracranial abnormalities. 2. Significant left-sided sinus disease, which has progressed since the prior CT. Left maxillary sinus is completely opacified and there is a new defect along the lower lateral wall. Active infection, including fungal infection, should be considered. There is also new complete opacification of the left frontal and anterior left ethmoid air cells, consistent with occlusion of the left ostiomeatal complex. CERVICAL CT 1. No fracture or acute skeletal abnormality. 2. Small right pleural effusion. Electronically Signed    By: Dedra Skeens.D.  On: 01/25/2020 09:00   CT Cervical Spine Wo Contrast  Result Date: 01/25/2020 CLINICAL DATA:  Fall this morning.  Dizziness for 4 days. EXAM: CT HEAD WITHOUT CONTRAST CT CERVICAL SPINE WITHOUT CONTRAST TECHNIQUE: Multidetector CT imaging of the head and cervical spine was performed following the standard protocol without intravenous contrast. Multiplanar CT image reconstructions of the cervical spine were also generated. COMPARISON:  04/07/2019 FINDINGS: CT HEAD FINDINGS Brain: No evidence of acute infarction, hemorrhage, hydrocephalus, extra-axial collection or mass lesion/mass effect. There is ventricular and sulcal enlargement reflecting mild diffuse atrophy. Small old white matter lacunar infarct noted in the left frontal lobe. Small lacunar infarct noted in the left central pons. Bilateral periventricular and subcortical white matter hypoattenuation is also present consistent mild to moderate chronic microvascular ischemic change. Vascular: No hyperdense vessel or unexpected calcification. Skull: Normal. Negative for fracture or focal lesion. Sinuses/Orbits: Left maxillary sinus is opacified. There is a defect along the lower lateral wall of the sinus with bone density material within the sinus cavities near its floor. Heterogeneous soft tissue attenuation bulges from the medial aspect of the sinus into the nasal cavity. There is complete opacification of the left frontal sinus and the anterior and several middle left ethmoid air cells. The defect along the lateral left maxillary sinus wall is new since the prior study. Opacification of the left frontal sinus and ethmoid air cells is also new. Globes and orbits are unremarkable. Other: None. CT CERVICAL SPINE FINDINGS Alignment: Normal. Skull base and vertebrae: No acute fracture. No primary bone lesion or focal pathologic process. Soft tissues and spinal canal: No prevertebral fluid or swelling. No visible canal hematoma. Disc  levels: Moderate loss of disc height at C6-C7 with spondylotic disc bulging. Remaining discs relatively well preserved in height. No convincing disc herniation. There are facet degenerative changes greatest on the right from C2-C3 through C5-C6. Upper chest: Small right pleural effusion. Other: None. IMPRESSION: HEAD CT 1. No acute intracranial abnormalities. 2. Significant left-sided sinus disease, which has progressed since the prior CT. Left maxillary sinus is completely opacified and there is a new defect along the lower lateral wall. Active infection, including fungal infection, should be considered. There is also new complete opacification of the left frontal and anterior left ethmoid air cells, consistent with occlusion of the left ostiomeatal complex. CERVICAL CT 1. No fracture or acute skeletal abnormality. 2. Small right pleural effusion. Electronically Signed   By: Lajean Manes M.D.   On: 01/25/2020 09:00    EKG: Independently reviewed.  Sinus rhythm, right bundle branch block T wave inversions in the lateral leads.  Assessment/Plan Principal Problem:   Seizure ([place]) Active Problems:   ESRD (end stage renal disease) ([place])   Hypothyroidism   Cerebrovascular accident (CVA) ([place])   Diabetic polyneuropathy associated with type 2 diabetes mellitus ([place])   Depression    Seizure Patient with a history of seizures on Keppra which was recently discontinued after a " normal EEG" Patient had a witnessed seizure in the ER and was loaded with Keppra 1.5g Will resume Keppra 500 mg twice daily We will consult neurology   Hyperkalemia Secondary to patient's known history of end-stage renal disease He received calcium gluconate and insulin in the ER Patient will be dialyzed today and this should correct his potassium levels Discontinue potassium supplements     History of CVA with left-sided weakness  Continue Plavix and statins    Diabetes mellitus with complications of 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 Kyle [name] over the phone.  All questions and concerns have been addressed. She verbalizes understanding and agrees with the plan.  Disposition Plan: Back to previous home environment Consults called: Neurology/ENT/Nephrology    Teiana Hajduk MD [facility]     01/25/2020, 9:57 AM

## 2020-01-25 NOTE — ED Notes (Signed)
EMS called out as they were passing by the pt room noted the pt was having seizure like activity, the EDP came to the room and noted the pt having snoring respirations and lac to the tongue. Pt place d on a NRB and secretions suctioned to clear air way. Orders received and initiated

## 2020-01-25 NOTE — ED Triage Notes (Addendum)
Pt ems from home for dizziness since Friday. S/p fall this am

## 2020-01-25 NOTE — Progress Notes (Signed)
Patient refused his heparin injection. He states "Im not supposed to be taking blood thinners."

## 2020-01-26 DIAGNOSIS — E039 Hypothyroidism, unspecified: Secondary | ICD-10-CM | POA: Diagnosis not present

## 2020-01-26 DIAGNOSIS — E1142 Type 2 diabetes mellitus with diabetic polyneuropathy: Secondary | ICD-10-CM | POA: Diagnosis not present

## 2020-01-26 DIAGNOSIS — I639 Cerebral infarction, unspecified: Secondary | ICD-10-CM | POA: Diagnosis not present

## 2020-01-26 DIAGNOSIS — N186 End stage renal disease: Secondary | ICD-10-CM | POA: Diagnosis not present

## 2020-01-26 LAB — C DIFFICILE QUICK SCREEN W PCR REFLEX
C Diff antigen: POSITIVE — AB
C Diff toxin: NEGATIVE

## 2020-01-26 LAB — CLOSTRIDIUM DIFFICILE BY PCR, REFLEXED: Toxigenic C. Difficile by PCR: NEGATIVE

## 2020-01-26 LAB — CBC
HCT: 32 % — ABNORMAL LOW (ref 39.0–52.0)
Hemoglobin: 10.2 g/dL — ABNORMAL LOW (ref 13.0–17.0)
MCH: 31.1 pg (ref 26.0–34.0)
MCHC: 31.9 g/dL (ref 30.0–36.0)
MCV: 97.6 fL (ref 80.0–100.0)
Platelets: 147 10*3/uL — ABNORMAL LOW (ref 150–400)
RBC: 3.28 MIL/uL — ABNORMAL LOW (ref 4.22–5.81)
RDW: 17.1 % — ABNORMAL HIGH (ref 11.5–15.5)
WBC: 5.5 10*3/uL (ref 4.0–10.5)
nRBC: 0 % (ref 0.0–0.2)

## 2020-01-26 LAB — GLUCOSE, CAPILLARY
Glucose-Capillary: 119 mg/dL — ABNORMAL HIGH (ref 70–99)
Glucose-Capillary: 108 mg/dL — ABNORMAL HIGH (ref 70–99)
Glucose-Capillary: 132 mg/dL — ABNORMAL HIGH (ref 70–99)
Glucose-Capillary: 178 mg/dL — ABNORMAL HIGH (ref 70–99)
Glucose-Capillary: 139 mg/dL — ABNORMAL HIGH (ref 70–99)
Glucose-Capillary: 99 mg/dL (ref 70–99)

## 2020-01-26 LAB — BASIC METABOLIC PANEL
Anion gap: 13 (ref 5–15)
BUN: 23 mg/dL (ref 8–23)
CO2: 27 mmol/L (ref 22–32)
Calcium: 8.1 mg/dL — ABNORMAL LOW (ref 8.9–10.3)
Chloride: 96 mmol/L — ABNORMAL LOW (ref 98–111)
Creatinine, Ser: 5.04 mg/dL — ABNORMAL HIGH (ref 0.61–1.24)
GFR, Estimated: 12 mL/min — ABNORMAL LOW (ref >60)
Glucose, Bld: 113 mg/dL — ABNORMAL HIGH (ref 70–99)
Potassium: 4.2 mmol/L (ref 3.5–5.1)
Sodium: 136 mmol/L (ref 135–145)

## 2020-01-26 LAB — HEMOGLOBIN A1C
Hgb A1c MFr Bld: 8.1 % — ABNORMAL HIGH (ref 4.8–5.6)
Mean Plasma Glucose: 185.77 mg/dL

## 2020-01-26 LAB — MAGNESIUM
Magnesium: 1.6 mg/dL — ABNORMAL LOW (ref 1.7–2.4)
Magnesium: 1.6 mg/dL — ABNORMAL LOW (ref 1.7–2.4)

## 2020-01-26 LAB — MRSA PCR SCREENING: MRSA by PCR: POSITIVE — AB

## 2020-01-26 LAB — TROPONIN I (HIGH SENSITIVITY): Troponin I (High Sensitivity): 36 ng/L — ABNORMAL HIGH (ref <18)

## 2020-01-26 MED ORDER — MUPIROCIN 2 % EX OINT
1.0000 "application " | TOPICAL_OINTMENT | Freq: Two times a day (BID) | CUTANEOUS | Status: DC
  Administered 2020-01-26 – 2020-01-28 (×4): 1 via NASAL
  Filled 2020-01-26: qty 22

## 2020-01-26 MED ORDER — SODIUM CHLORIDE 0.9 % IV SOLN: INTRAVENOUS | Status: DC

## 2020-01-26 MED ORDER — NYSTATIN 100000 UNIT/GM EX POWD
Freq: Three times a day (TID) | CUTANEOUS | Status: DC
  Filled 2020-01-26: qty 15

## 2020-01-26 MED ORDER — MAGNESIUM OXIDE 400 (241.3 MG) MG PO TABS
400.0000 mg | ORAL_TABLET | Freq: Two times a day (BID) | ORAL | Status: DC
  Administered 2020-01-26 – 2020-01-28 (×4): 400 mg via ORAL
  Filled 2020-01-26 (×4): qty 1

## 2020-01-26 MED ORDER — ONDANSETRON HCL 4 MG/2ML IJ SOLN
4.0000 mg | Freq: Once | INTRAMUSCULAR | Status: DC
  Filled 2020-01-26: qty 2

## 2020-01-26 MED ORDER — CHLORHEXIDINE GLUCONATE CLOTH 2 % EX PADS
6.0000 | MEDICATED_PAD | Freq: Every day | CUTANEOUS | Status: DC
  Administered 2020-01-28: 6 via TOPICAL

## 2020-01-26 MED ORDER — AMOXICILLIN-POT CLAVULANATE 500-125 MG PO TABS
1.0000 | ORAL_TABLET | ORAL | Status: DC
  Administered 2020-01-26 – 2020-01-27 (×3): 500 mg via ORAL
  Filled 2020-01-26 (×4): qty 1

## 2020-01-26 NOTE — Progress Notes (Signed)
[facility]  ROUNDING NOTE   Subjective:   Patient presented to the ED on 01/25/2020 , with a possible seizure and  fall.  He has history of end-stage renal disease and is on hemodialysis Monday Wednesday and Friday.  Patient appears more awake and alert today. He received dialysis treatment yesterday,so will plan for dialysis tomorrow.      Objective:  Vital signs in last 24 hours:  Temp:  [98.4 F (36.9 C)-99 F (37.2 C)] 98.7 F (37.1 C) (11/30 1126) Pulse Rate:  [78-88] 87 (11/30 1126) Resp:  [16-22] 18 (11/30 1126) BP: (144-166)/(65-117) 159/98 (11/30 1126) SpO2:  [96 %-100 %] 99 % (11/30 1126)  Weight change:  Filed Weights   01/25/20 0834  Weight: 79.8 kg    Intake/Output: I/O last 3 completed shifts: In: 47 [IV Piggyback:70] Out: 500 [Other:500]   Intake/Output this shift:  Total I/O In: 310 [P.O.:240; IV Piggyback:70] Out: 100 [Urine:100]  Physical Exam: General: Resting in bed, in no acute distress  Head: Moist oral mucosal membranes  Eyes:  Anicteric  Lungs:  lungs clear,respirations even,unlabored  Heart: S1S2, no rubs or gallops  Abdomen:  Soft, nontender, nondistended  Extremities:  No peripheral edema  Neurologic: Awake, alert, able to answer simple questions appropriately  Skin: No acute lesions or rashes  Access:  Left upper arm aVF    Basic Metabolic Panel: Recent Labs  Lab 01/25/20 0816 01/26/20 0546  NA 131* 136  K 6.0* 4.2  CL 91* 96*  CO2 24 27  GLUCOSE 235* 113*  BUN 43* 23  CREATININE 7.70* 5.04*  CALCIUM 7.9* 8.1*  MG 1.4*  --     Liver Function Tests: Recent Labs  Lab 01/25/20 0816  AST 16  ALT 7  ALKPHOS 93  BILITOT 1.2  PROT 6.8  ALBUMIN 3.5   No results for input(s): LIPASE, AMYLASE in the last 168 hours. No results for input(s): AMMONIA in the last 168 hours.  CBC: Recent Labs  Lab 01/25/20 0816 01/26/20 0546  WBC 7.8 5.5  NEUTROABS 6.2  --   HGB 11.7* 10.2*  HCT 37.4* 32.0*  MCV  99.2 97.6  PLT 170 147*    Cardiac Enzymes: No results for input(s): CKTOTAL, CKMB, CKMBINDEX, TROPONINI in the last 168 hours.  BNP: Invalid input(s): POCBNP  CBG: Recent Labs  Lab 01/25/20 2149 01/26/20 0123 01/26/20 0427 01/26/20 0817 01/26/20 1144  GLUCAP 74 119* 108* 132* 178*    Microbiology: Results for orders placed or performed during the hospital encounter of 01/25/20  Resp Panel by RT-PCR (Flu A&B, Covid) Nasopharyngeal Swab     Status: None   Collection Time: 01/25/20  8:16 AM   Specimen: Nasopharyngeal Swab; Nasopharyngeal(NP) swabs in vial transport medium  Result Value Ref Range Status   SARS Coronavirus 2 by RT PCR NEGATIVE NEGATIVE Final    Comment: (NOTE) SARS-CoV-2 target nucleic acids are NOT DETECTED.  The SARS-CoV-2 RNA is generally detectable in upper respiratory specimens during the acute phase of infection. The lowest concentration of SARS-CoV-2 viral copies this assay can detect is 138 copies/mL. A negative result does not preclude SARS-Cov-2 infection and should not be used as the sole basis for treatment or other patient management decisions. A negative result may occur with  improper specimen collection/handling, submission of specimen other than nasopharyngeal swab, presence of viral mutation(s) within the areas targeted by this assay, and inadequate number of viral copies(<138 copies/mL). A negative result must be combined with clinical observations, patient  history, and epidemiological information. The expected result is Negative.  Fact Sheet for Patients:  EntrepreneurPulse.com.au  Fact Sheet for Healthcare Providers:  IncredibleEmployment.be  This test is no t yet approved or cleared by the [place] FDA and  has been authorized for detection and/or diagnosis of SARS-CoV-2 by FDA under an Emergency Use Authorization (EUA). This EUA will remain  in effect (meaning this test can be used) for  the duration of the COVID-19 declaration under Section 564(b)(1) of the Act, 21 U.S.C.section 360bbb-3(b)(1), unless the authorization is terminated  or revoked sooner.       Influenza A by PCR NEGATIVE NEGATIVE Final   Influenza B by PCR NEGATIVE NEGATIVE Final    Comment: (NOTE) The Xpert Xpress SARS-CoV-2/FLU/RSV plus assay is intended as an aid in the diagnosis of influenza from Nasopharyngeal swab specimens and should not be used as a sole basis for treatment. Nasal washings and aspirates are unacceptable for Xpert Xpress SARS-CoV-2/FLU/RSV testing.  Fact Sheet for Patients: EntrepreneurPulse.com.au  Fact Sheet for Healthcare Providers: IncredibleEmployment.be  This test is not yet approved or cleared by the [place] FDA and has been authorized for detection and/or diagnosis of SARS-CoV-2 by FDA under an Emergency Use Authorization (EUA). This EUA will remain in effect (meaning this test can be used) for the duration of the COVID-19 declaration under Section 564(b)(1) of the Act, 21 U.S.C. section 360bbb-3(b)(1), unless the authorization is terminated or revoked.  Performed at [facility], [address] [place], [place] [address]     Coagulation Studies: No results for input(s): LABPROT, INR in the last 72 hours.  Urinalysis: No results for input(s): COLORURINE, LABSPEC, PHURINE, GLUCOSEU, HGBUR, BILIRUBINUR, KETONESUR, PROTEINUR, UROBILINOGEN, NITRITE, LEUKOCYTESUR in the last 72 hours.  Invalid input(s): APPERANCEUR    Imaging: EEG  Result Date: 01/25/2020 Lora Havens, MD     01/25/2020  4:38 PM Patient Name: Niclas Markell MRN: [id number] Epilepsy Attending: Lora Havens Referring Physician/Provider: Dr Kerney Elbe Date: 01/25/2020 Duration: 27.53 mins Patient history: 69 year old male presenting with seizure recurrence after recent discontinuation of anticonvulsant. EEG to evaluate for seizure  Level of alertness: Awake AEDs during EEG study: GBP, LCM Technical aspects: This EEG study was done with scalp electrodes positioned according to the 10-20 International system of electrode placement. Electrical activity was acquired at a sampling rate of 500Hz  and reviewed with a high frequency filter of 70Hz  and a low frequency filter of 1Hz . EEG data were recorded continuously and digitally stored. Description: No posterior dominant rhythm was seen. EEG showed continuous generalized and maximal right temporal region 5 to 6 Hz theta slowing as well as 2-3hz  delta slowing in right temporal region. Physiologic photic driving was not seen during photic stimulation.  Hyperventilation was not performed.   ABNORMALITY -Continuous slow, generalized and maximal right temporal region IMPRESSION: This study is suggestive of cortical dysfunction in right temporal region which could be secondary to underlying structural abnormality, post-ictal state. There is also moderate diffuse encephalopathy, nonspecific etiology. No seizures or epileptiform discharges were seen throughout the recording. Lora Havens   DG Chest 1 View  Result Date: 01/25/2020 CLINICAL DATA:  Fall/syncope. EXAM: CHEST  1 VIEW COMPARISON:  May 02, 2019. FINDINGS: Similar mildly enlarged cardiac silhouette. Aortic atherosclerosis. No consolidation. Streaky bibasilar opacities. No visible pleural effusions or pneumothorax. Multiple healing right rib fractures. Remote left rib fracture. Right thoracic inlet clips. IMPRESSION: 1. Streaky bibasilar opacities, favor atelectasis. Aspiration or pneumonia is not excluded.  2. Healing right rib fractures. Electronically Signed   By: Margaretha Sheffield MD   On: 01/25/2020 09:14   DG Elbow 2 Views Left  Result Date: 01/25/2020 CLINICAL DATA:  [place] this morning.  Bilateral elbow pain. EXAM: LEFT ELBOW - 2 VIEW COMPARISON:  None. FINDINGS: No fracture or bone lesion. Elbow joint is normally spaced and  aligned.  No joint effusion. There are anterior surgical vascular clips. IMPRESSION: No fracture or elbow joint abnormality Electronically Signed   By: Lajean Manes M.D.   On: 01/25/2020 09:09   DG Elbow 2 Views Right  Result Date: 01/25/2020 CLINICAL DATA:  Dizziness for 4 days. Fell this morning. Bilateral elbow pain. EXAM: RIGHT ELBOW - 2 VIEW COMPARISON:  None. FINDINGS: There is no evidence of fracture, dislocation, or joint effusion. There is no evidence of arthropathy or other focal bone abnormality. Soft tissues are unremarkable. IMPRESSION: Negative. Electronically Signed   By: Lajean Manes M.D.   On: 01/25/2020 09:10   CT Head Wo Contrast  Result Date: 01/25/2020 CLINICAL DATA:  Fall this morning.  Dizziness for 4 days. EXAM: CT HEAD WITHOUT CONTRAST CT CERVICAL SPINE WITHOUT CONTRAST TECHNIQUE: Multidetector CT imaging of the head and cervical spine was performed following the standard protocol without intravenous contrast. Multiplanar CT image reconstructions of the cervical spine were also generated. COMPARISON:  04/07/2019 FINDINGS: CT HEAD FINDINGS Brain: No evidence of acute infarction, hemorrhage, hydrocephalus, extra-axial collection or mass lesion/mass effect. There is ventricular and sulcal enlargement reflecting mild diffuse atrophy. Small old white matter lacunar infarct noted in the left frontal lobe. Small lacunar infarct noted in the left central pons. Bilateral periventricular and subcortical white matter hypoattenuation is also present consistent mild to moderate chronic microvascular ischemic change. Vascular: No hyperdense vessel or unexpected calcification. Skull: Normal. Negative for fracture or focal lesion. Sinuses/Orbits: Left maxillary sinus is opacified. There is a defect along the lower lateral wall of the sinus with bone density material within the sinus cavities near its floor. Heterogeneous soft tissue attenuation bulges from the medial aspect of the sinus into the  nasal cavity. There is complete opacification of the left frontal sinus and the anterior and several middle left ethmoid air cells. The defect along the lateral left maxillary sinus wall is new since the prior study. Opacification of the left frontal sinus and ethmoid air cells is also new. Globes and orbits are unremarkable. Other: None. CT CERVICAL SPINE FINDINGS Alignment: Normal. Skull base and vertebrae: No acute fracture. No primary bone lesion or focal pathologic process. Soft tissues and spinal canal: No prevertebral fluid or swelling. No visible canal hematoma. Disc levels: Moderate loss of disc height at C6-C7 with spondylotic disc bulging. Remaining discs relatively well preserved in height. No convincing disc herniation. There are facet degenerative changes greatest on the right from C2-C3 through C5-C6. Upper chest: Small right pleural effusion. Other: None. IMPRESSION: HEAD CT 1. No acute intracranial abnormalities. 2. Significant left-sided sinus disease, which has progressed since the prior CT. Left maxillary sinus is completely opacified and there is a new defect along the lower lateral wall. Active infection, including fungal infection, should be considered. There is also new complete opacification of the left frontal and anterior left ethmoid air cells, consistent with occlusion of the left ostiomeatal complex. CERVICAL CT 1. No fracture or acute skeletal abnormality. 2. Small right pleural effusion. Electronically Signed   By: Lajean Manes M.D.   On: 01/25/2020 09:00   CT Cervical Spine Wo Contrast  Result Date: 01/25/2020 CLINICAL DATA:  Fall this morning.  Dizziness for 4 days. EXAM: CT HEAD WITHOUT CONTRAST CT CERVICAL SPINE WITHOUT CONTRAST TECHNIQUE: Multidetector CT imaging of the head and cervical spine was performed following the standard protocol without intravenous contrast. Multiplanar CT image reconstructions of the cervical spine were also generated. COMPARISON:  04/07/2019  FINDINGS: CT HEAD FINDINGS Brain: No evidence of acute infarction, hemorrhage, hydrocephalus, extra-axial collection or mass lesion/mass effect. There is ventricular and sulcal enlargement reflecting mild diffuse atrophy. Small old white matter lacunar infarct noted in the left frontal lobe. Small lacunar infarct noted in the left central pons. Bilateral periventricular and subcortical white matter hypoattenuation is also present consistent mild to moderate chronic microvascular ischemic change. Vascular: No hyperdense vessel or unexpected calcification. Skull: Normal. Negative for fracture or focal lesion. Sinuses/Orbits: Left maxillary sinus is opacified. There is a defect along the lower lateral wall of the sinus with bone density material within the sinus cavities near its floor. Heterogeneous soft tissue attenuation bulges from the medial aspect of the sinus into the nasal cavity. There is complete opacification of the left frontal sinus and the anterior and several middle left ethmoid air cells. The defect along the lateral left maxillary sinus wall is new since the prior study. Opacification of the left frontal sinus and ethmoid air cells is also new. Globes and orbits are unremarkable. Other: None. CT CERVICAL SPINE FINDINGS Alignment: Normal. Skull base and vertebrae: No acute fracture. No primary bone lesion or focal pathologic process. Soft tissues and spinal canal: No prevertebral fluid or swelling. No visible canal hematoma. Disc levels: Moderate loss of disc height at C6-C7 with spondylotic disc bulging. Remaining discs relatively well preserved in height. No convincing disc herniation. There are facet degenerative changes greatest on the right from C2-C3 through C5-C6. Upper chest: Small right pleural effusion. Other: None. IMPRESSION: HEAD CT 1. No acute intracranial abnormalities. 2. Significant left-sided sinus disease, which has progressed since the prior CT. Left maxillary sinus is completely  opacified and there is a new defect along the lower lateral wall. Active infection, including fungal infection, should be considered. There is also new complete opacification of the left frontal and anterior left ethmoid air cells, consistent with occlusion of the left ostiomeatal complex. CERVICAL CT 1. No fracture or acute skeletal abnormality. 2. Small right pleural effusion. Electronically Signed   By: Lajean Manes M.D.   On: 01/25/2020 09:00     Medications:   . sodium chloride 250 mL (01/26/20 0904)  . lacosamide (VIMPAT) IV Stopped (01/26/20 0945)   . amoxicillin-clavulanate  1 tablet Oral Q24H  . atorvastatin  10 mg Oral Daily  . calcium acetate  1,334 mg Oral TID WC  . Chlorhexidine Gluconate Cloth  6 each Topical Q0600  . clopidogrel  75 mg Oral Daily  . escitalopram  10 mg Oral Daily  . gabapentin  100 mg Oral BID  . heparin  5,000 Units Subcutaneous Q8H  . insulin aspart  0-9 Units Subcutaneous TID WC  . levothyroxine  200 mcg Oral QAC breakfast  . lidocaine-prilocaine  1 application Topical Q T,Th,Sa-HD  . lipase/protease/amylase  24,000 Units Oral TID WC  . losartan  100 mg Oral Daily  . multivitamin  1 tablet Oral Daily  . sodium chloride flush  3 mL Intravenous Q12H  . traZODone  100 mg Oral QHS   sodium chloride, hydrOXYzine, polyethylene glycol powder, sodium chloride flush  Assessment/ Plan:  Mr. Chord Takahashi is a  69 y.o.  male with medical problems  including CVA with left-sided weakness, ESRD on HD MWF, seizure disorder, DM, hypertension, presented to the emergency department for a possible seizure at home.  #End-stage renal disease on HD MWF Received dialysis treatment yesterday Volume and electrolyte status acceptable No additional dialysis required today Will continue MWF schedule  #Hyperkalemia Potassium normalized to 4.2 today  #Diabetes type 2 with CKD Lab Results  Component Value Date   HGBA1C 8.1 (H) 01/26/2020  Currently controlled with  Sliding scale insulin  #Anemia with CKD Lab Results  Component Value Date   HGB 10.2 (L) 01/26/2020  At goal, will continue monitoring  #Secondary hyperparathyroidism Lab Results  Component Value Date   PTH 91 (H) 03/13/2019   CALCIUM 8.1 (L) 01/26/2020   CAION 0.93 (L) 04/05/2019   PHOS 3.6 07/29/2019  Continue Calcium Acetate  #Hypertension BP 159/58 today Patient is on Losartan    LOS: 1 Shalaunda Weatherholtz 11/30/20211:11 PM

## 2020-01-26 NOTE — Progress Notes (Signed)
PROGRESS NOTE  Kyle [name] OTL:[id number] DOB: [birth year] DOA: 01/25/2020 PCP: Idelle Crouch, MD   LOS: 1 day   Brief narrative: As per HPI,  Kyle [name] is a 69 y.o. male with medical history significant for CVA with left-sided weakness, end-stage renal disease on hemodialysis (M/W/F), history of seizure disorder, diabetes mellitus, hypertension was brought into the emergency room by EMS for evaluation of a fall that occurred on the morning of this admission and concern for seizure per patient.    In the emergency department patient was thought to have seizure-like activity.  He was given Keppra load and remained postictal..  His wife stated that he had an EEG done recently by his neurologist and that his Keppra was discontinued because the EEG did not show any evidence of seizures.  Laboratory data showed mild hyponatremia and mild hyper kalemia with elevated blood glucose level.  Creatinine elevated at 7.7.  Magnesium of 1.4. CT scan of the head without contrast shows no acute intracranial abnormalities.  Significant left-sided sinus disease, which has progressed since the prior CT.  Left maxillary sinus is completely opacified and there is a new defect along the lower lateral wall.    ENT was consulted from the ED for this finding.  Cervical spine CT shows no fracture or acute skeletal abnormality.  Small right pleural effusion.  Chest x-ray showed basilar opacities.   In the ED patient received calcium gluconate and insulin IV and was admitted to hospital.   Assessment/Plan:  Principal Problem:   Seizure ([place]) Active Problems:   ESRD (end stage renal disease) ([place])   Hypothyroidism   Cerebrovascular accident (CVA) ([place])   Diabetic polyneuropathy associated with type 2 diabetes mellitus ([place])   Depression   Seizure with postictal state Patient with a history of seizures on Keppra which was recently discontinued after a " normal EEG".  Had witnessed seizure in the ED  and Keppra load was given.  Neurology was consulted and subsequently this has been discontinued and patient has been started on Vimpat.  Neurology to follow the patient while in the hospital.  Received hemodialysis yesterday.  We will also get a speech consult.  Left maxillary sinus disease.  Seen by ENT.  Recommend Augmentin.  Pharmacy has been consulted for dosing.  Patient's a fungal ball in the nasal cavity.  Will need to see ENT as outpatient.  Hyperkalemia Received temporizing measures.  Received hemodialysis.  Will closely monitor. Potassium has normalized to 4.2 today.  History of CVA with left-sided weakness  Continue Plavix and statins  Diabetes mellitus with end-stage renal disease.  Continue sliding scale insulin, Accu-Cheks, diabetic diet. Controlled at this time.  Latest hemoglobin A1c of 8.1.  Depression On trazodone and Lexapro at home.  Hypothyroidism Continue Synthroid  Hypomagnesemia Mildly low.  We will supplement.  Check levels in a.m.  DVT prophylaxis: Place and maintain sequential compression device Start: 01/26/20 1310 heparin injection 5,000 Units Start: 01/25/20 1400   Code Status: Full code  Family Communication: None  Status is: Inpatient  Remains inpatient appropriate because:IV treatments appropriate due to intensity of illness or inability to take PO and Inpatient level of care appropriate due to severity of illness , likely need skilled nursing facility placement.   Dispo: The patient is from: Home              Anticipated d/c is to: Possible skilled nursing facility placement.  Anticipated d/c date is: 2 days              Patient currently is not medically stable to d/c.  Consultants:  Neurology  Nephrology  ENT  Procedures:  Hemodialysis  EEG   Antibiotics:  . Augmentin p.o.  Anti-infectives (From admission, onward)   Start     Dose/Rate Route Frequency Ordered Stop   01/26/20 1100   amoxicillin-clavulanate (AUGMENTIN) 500-125 MG per tablet 500 mg        1 tablet Oral Every 24 hours 01/26/20 0918       Subjective:  Today, patient was seen and examined at bedside.  Complains of mild cough and congestion.  Complains of discomfort in the nose.  No fever chills or rigor.  No headache.  Alert awake and communicative at the time of my time.  Objective: Vitals:   01/26/20 0749 01/26/20 1126  BP: (!) 148/74 (!) 159/98  Pulse: 85 87  Resp: 18 18  Temp: 98.6 F (37 C) 98.7 F (37.1 C)  SpO2: 100% 99%    Intake/Output Summary (Last 24 hours) at 01/26/2020 1520 Last data filed at 01/26/2020 1419 Gross per 24 hour  Intake 550 ml  Output 600 ml  Net -50 ml   Filed Weights   01/25/20 0834  Weight: 79.8 kg   Body mass index is 23.87 kg/m.   Physical Exam: GENERAL: Patient is alert awake and oriented to place and person. Not in obvious distress. HENT: No scleral pallor or icterus. Pupils equally reactive to light. Oral mucosa is moist NECK: is supple, no gross swelling noted. CHEST: Clear to auscultation. No crackles or wheezes.  Diminished breath sounds bilaterally. CVS: S1 and S2 heard, no murmur. Regular rate and rhythm.  ABDOMEN: Soft, non-tender, bowel sounds are present. EXTREMITIES: No edema. CNS: Cranial nerves are intact. No focal motor deficits. SKIN: warm and dry without rashes.  Data Review: I have personally reviewed the following laboratory data and studies,  CBC: Recent Labs  Lab 01/25/20 0816 01/26/20 0546  WBC 7.8 5.5  NEUTROABS 6.2  --   HGB 11.7* 10.2*  HCT 37.4* 32.0*  MCV 99.2 97.6  PLT 170 811*   Basic Metabolic Panel: Recent Labs  Lab 01/25/20 0816 01/26/20 0546  NA 131* 136  K 6.0* 4.2  CL 91* 96*  CO2 24 27  GLUCOSE 235* 113*  BUN 43* 23  CREATININE 7.70* 5.04*  CALCIUM 7.9* 8.1*  MG 1.4* 1.6*   Liver Function Tests: Recent Labs  Lab 01/25/20 0816  AST 16  ALT 7  ALKPHOS 93  BILITOT 1.2  PROT 6.8  ALBUMIN  3.5   No results for input(s): LIPASE, AMYLASE in the last 168 hours. No results for input(s): AMMONIA in the last 168 hours. Cardiac Enzymes: No results for input(s): CKTOTAL, CKMB, CKMBINDEX, TROPONINI in the last 168 hours. BNP (last 3 results) Recent Labs    01/25/20 0816  BNP 2,916.1*    ProBNP (last 3 results) No results for input(s): PROBNP in the last 8760 hours.  CBG: Recent Labs  Lab 01/25/20 2149 01/26/20 0123 01/26/20 0427 01/26/20 0817 01/26/20 1144  GLUCAP 74 119* 108* 132* 178*   Recent Results (from the past 240 hour(s))  Resp Panel by RT-PCR (Flu A&B, Covid) Nasopharyngeal Swab     Status: None   Collection Time: 01/25/20  8:16 AM   Specimen: Nasopharyngeal Swab; Nasopharyngeal(NP) swabs in vial transport medium  Result Value Ref Range Status   SARS Coronavirus 2  by RT PCR NEGATIVE NEGATIVE Final    Comment: (NOTE) SARS-CoV-2 target nucleic acids are NOT DETECTED.  The SARS-CoV-2 RNA is generally detectable in upper respiratory specimens during the acute phase of infection. The lowest concentration of SARS-CoV-2 viral copies this assay can detect is 138 copies/mL. A negative result does not preclude SARS-Cov-2 infection and should not be used as the sole basis for treatment or other patient management decisions. A negative result may occur with  improper specimen collection/handling, submission of specimen other than nasopharyngeal swab, presence of viral mutation(s) within the areas targeted by this assay, and inadequate number of viral copies(<138 copies/mL). A negative result must be combined with clinical observations, patient history, and epidemiological information. The expected result is Negative.  Fact Sheet for Patients:  EntrepreneurPulse.com.au  Fact Sheet for Healthcare Providers:  IncredibleEmployment.be  This test is no t yet approved or cleared by the [place] FDA and  has been authorized  for detection and/or diagnosis of SARS-CoV-2 by FDA under an Emergency Use Authorization (EUA). This EUA will remain  in effect (meaning this test can be used) for the duration of the COVID-19 declaration under Section 564(b)(1) of the Act, 21 U.S.C.section 360bbb-3(b)(1), unless the authorization is terminated  or revoked sooner.       Influenza A by PCR NEGATIVE NEGATIVE Final   Influenza B by PCR NEGATIVE NEGATIVE Final    Comment: (NOTE) The Xpert Xpress SARS-CoV-2/FLU/RSV plus assay is intended as an aid in the diagnosis of influenza from Nasopharyngeal swab specimens and should not be used as a sole basis for treatment. Nasal washings and aspirates are unacceptable for Xpert Xpress SARS-CoV-2/FLU/RSV testing.  Fact Sheet for Patients: EntrepreneurPulse.com.au  Fact Sheet for Healthcare Providers: IncredibleEmployment.be  This test is not yet approved or cleared by the [place] FDA and has been authorized for detection and/or diagnosis of SARS-CoV-2 by FDA under an Emergency Use Authorization (EUA). This EUA will remain in effect (meaning this test can be used) for the duration of the COVID-19 declaration under Section 564(b)(1) of the Act, 21 U.S.C. section 360bbb-3(b)(1), unless the authorization is terminated or revoked.  Performed at [facility], [address] [place], [place] [address]      Studies: EEG  Result Date: 01/25/2020 Lora Havens, MD     01/25/2020  4:38 PM Patient Name: Kyle [name] MRN: [id number] Epilepsy Attending: Lora Havens Referring Physician/Provider: Dr Kerney Elbe Date: 01/25/2020 Duration: 27.53 mins Patient history: 69 year old male presenting with seizure recurrence after recent discontinuation of anticonvulsant. EEG to evaluate for seizure Level of alertness: Awake AEDs during EEG study: GBP, LCM Technical aspects: This EEG study was done with scalp electrodes positioned  according to the 10-20 International system of electrode placement. Electrical activity was acquired at a sampling rate of 500Hz  and reviewed with a high frequency filter of 70Hz  and a low frequency filter of 1Hz . EEG data were recorded continuously and digitally stored. Description: No posterior dominant rhythm was seen. EEG showed continuous generalized and maximal right temporal region 5 to 6 Hz theta slowing as well as 2-3hz  delta slowing in right temporal region. Physiologic photic driving was not seen during photic stimulation.  Hyperventilation was not performed.   ABNORMALITY -Continuous slow, generalized and maximal right temporal region IMPRESSION: This study is suggestive of cortical dysfunction in right temporal region which could be secondary to underlying structural abnormality, post-ictal state. There is also moderate diffuse encephalopathy, nonspecific etiology. No seizures or epileptiform discharges  were seen throughout the recording. Lora Havens   DG Chest 1 View  Result Date: 01/25/2020 CLINICAL DATA:  Fall/syncope. EXAM: CHEST  1 VIEW COMPARISON:  May 02, 2019. FINDINGS: Similar mildly enlarged cardiac silhouette. Aortic atherosclerosis. No consolidation. Streaky bibasilar opacities. No visible pleural effusions or pneumothorax. Multiple healing right rib fractures. Remote left rib fracture. Right thoracic inlet clips. IMPRESSION: 1. Streaky bibasilar opacities, favor atelectasis. Aspiration or pneumonia is not excluded. 2. Healing right rib fractures. Electronically Signed   By: Margaretha Sheffield MD   On: 01/25/2020 09:14   DG Elbow 2 Views Left  Result Date: 01/25/2020 CLINICAL DATA:  [place] this morning.  Bilateral elbow pain. EXAM: LEFT ELBOW - 2 VIEW COMPARISON:  None. FINDINGS: No fracture or bone lesion. Elbow joint is normally spaced and aligned.  No joint effusion. There are anterior surgical vascular clips. IMPRESSION: No fracture or elbow joint abnormality Electronically  Signed   By: Lajean Manes M.D.   On: 01/25/2020 09:09   DG Elbow 2 Views Right  Result Date: 01/25/2020 CLINICAL DATA:  Dizziness for 4 days. Fell this morning. Bilateral elbow pain. EXAM: RIGHT ELBOW - 2 VIEW COMPARISON:  None. FINDINGS: There is no evidence of fracture, dislocation, or joint effusion. There is no evidence of arthropathy or other focal bone abnormality. Soft tissues are unremarkable. IMPRESSION: Negative. Electronically Signed   By: Lajean Manes M.D.   On: 01/25/2020 09:10   CT Head Wo Contrast  Result Date: 01/25/2020 CLINICAL DATA:  Fall this morning.  Dizziness for 4 days. EXAM: CT HEAD WITHOUT CONTRAST CT CERVICAL SPINE WITHOUT CONTRAST TECHNIQUE: Multidetector CT imaging of the head and cervical spine was performed following the standard protocol without intravenous contrast. Multiplanar CT image reconstructions of the cervical spine were also generated. COMPARISON:  04/07/2019 FINDINGS: CT HEAD FINDINGS Brain: No evidence of acute infarction, hemorrhage, hydrocephalus, extra-axial collection or mass lesion/mass effect. There is ventricular and sulcal enlargement reflecting mild diffuse atrophy. Small old white matter lacunar infarct noted in the left frontal lobe. Small lacunar infarct noted in the left central pons. Bilateral periventricular and subcortical white matter hypoattenuation is also present consistent mild to moderate chronic microvascular ischemic change. Vascular: No hyperdense vessel or unexpected calcification. Skull: Normal. Negative for fracture or focal lesion. Sinuses/Orbits: Left maxillary sinus is opacified. There is a defect along the lower lateral wall of the sinus with bone density material within the sinus cavities near its floor. Heterogeneous soft tissue attenuation bulges from the medial aspect of the sinus into the nasal cavity. There is complete opacification of the left frontal sinus and the anterior and several middle left ethmoid air cells. The  defect along the lateral left maxillary sinus wall is new since the prior study. Opacification of the left frontal sinus and ethmoid air cells is also new. Globes and orbits are unremarkable. Other: None. CT CERVICAL SPINE FINDINGS Alignment: Normal. Skull base and vertebrae: No acute fracture. No primary bone lesion or focal pathologic process. Soft tissues and spinal canal: No prevertebral fluid or swelling. No visible canal hematoma. Disc levels: Moderate loss of disc height at C6-C7 with spondylotic disc bulging. Remaining discs relatively well preserved in height. No convincing disc herniation. There are facet degenerative changes greatest on the right from C2-C3 through C5-C6. Upper chest: Small right pleural effusion. Other: None. IMPRESSION: HEAD CT 1. No acute intracranial abnormalities. 2. Significant left-sided sinus disease, which has progressed since the prior CT. Left maxillary sinus is completely opacified and there  is a new defect along the lower lateral wall. Active infection, including fungal infection, should be considered. There is also new complete opacification of the left frontal and anterior left ethmoid air cells, consistent with occlusion of the left ostiomeatal complex. CERVICAL CT 1. No fracture or acute skeletal abnormality. 2. Small right pleural effusion. Electronically Signed   By: Lajean Manes M.D.   On: 01/25/2020 09:00   CT Cervical Spine Wo Contrast  Result Date: 01/25/2020 CLINICAL DATA:  Fall this morning.  Dizziness for 4 days. EXAM: CT HEAD WITHOUT CONTRAST CT CERVICAL SPINE WITHOUT CONTRAST TECHNIQUE: Multidetector CT imaging of the head and cervical spine was performed following the standard protocol without intravenous contrast. Multiplanar CT image reconstructions of the cervical spine were also generated. COMPARISON:  04/07/2019 FINDINGS: CT HEAD FINDINGS Brain: No evidence of acute infarction, hemorrhage, hydrocephalus, extra-axial collection or mass lesion/mass  effect. There is ventricular and sulcal enlargement reflecting mild diffuse atrophy. Small old white matter lacunar infarct noted in the left frontal lobe. Small lacunar infarct noted in the left central pons. Bilateral periventricular and subcortical white matter hypoattenuation is also present consistent mild to moderate chronic microvascular ischemic change. Vascular: No hyperdense vessel or unexpected calcification. Skull: Normal. Negative for fracture or focal lesion. Sinuses/Orbits: Left maxillary sinus is opacified. There is a defect along the lower lateral wall of the sinus with bone density material within the sinus cavities near its floor. Heterogeneous soft tissue attenuation bulges from the medial aspect of the sinus into the nasal cavity. There is complete opacification of the left frontal sinus and the anterior and several middle left ethmoid air cells. The defect along the lateral left maxillary sinus wall is new since the prior study. Opacification of the left frontal sinus and ethmoid air cells is also new. Globes and orbits are unremarkable. Other: None. CT CERVICAL SPINE FINDINGS Alignment: Normal. Skull base and vertebrae: No acute fracture. No primary bone lesion or focal pathologic process. Soft tissues and spinal canal: No prevertebral fluid or swelling. No visible canal hematoma. Disc levels: Moderate loss of disc height at C6-C7 with spondylotic disc bulging. Remaining discs relatively well preserved in height. No convincing disc herniation. There are facet degenerative changes greatest on the right from C2-C3 through C5-C6. Upper chest: Small right pleural effusion. Other: None. IMPRESSION: HEAD CT 1. No acute intracranial abnormalities. 2. Significant left-sided sinus disease, which has progressed since the prior CT. Left maxillary sinus is completely opacified and there is a new defect along the lower lateral wall. Active infection, including fungal infection, should be considered. There  is also new complete opacification of the left frontal and anterior left ethmoid air cells, consistent with occlusion of the left ostiomeatal complex. CERVICAL CT 1. No fracture or acute skeletal abnormality. 2. Small right pleural effusion. Electronically Signed   By: Lajean Manes M.D.   On: 01/25/2020 09:00      Flora Lipps, MD  [facility] 01/26/2020

## 2020-01-26 NOTE — TOC Initial Note (Signed)
Transition of Care [facility]) - Initial/Assessment Note    Patient Details  Name: Kyle [name] MRN: [id number] Date of Birth: [birth year]  Transition of Care [facility]) CM/SW Contact:    Shelbie Hutching, RN Phone Number: 01/26/2020, 3:45 PM  Clinical Narrative:                 Patient admitted to the hospital with seizure activity.  RNCM met with patient at the bedside and spoke with patient's wife, [name], over the phone.  Patient is from home with his wife, [name] reports that he can bath himself, she helps him get in the shower, and he can feed himself.  He has been walking with a walker but has been having a lot of falls.  [name] would like for the patient to go to rehab and get stronger before coming home.  Patient is a dialysis patient, MWF at [facility] in [place] off of [address], wife provides transportation.   Patient has had [facility] recently but was discharged from their services.    PT consult has been ordered to evaluate for SNF placement.  RNCM will begin SNF workup.   Expected Discharge Plan: Skilled Nursing Facility Barriers to Discharge: Continued Medical Work up   Patient Goals and CMS Choice Patient states their goals for this hospitalization and ongoing recovery are:: Patient's wife would like for patient to go for short term rehab CMS Medicare.gov Compare Post Acute Care list provided to:: Patient Represenative (must comment) Choice offered to / list presented to : Spouse  Expected Discharge Plan and Services Expected Discharge Plan: [place]   Discharge Planning Services: CM Consult Post Acute Care Choice: [place] arrangements for the past 2 months: Single Family Home                 DME Arranged: N/A DME Agency: NA       HH Arranged: NA          Prior Living Arrangements/Services Living arrangements for the past 2 months: Single Family Home Lives with:: Spouse Patient language and need for interpreter  reviewed:: Yes Do you feel safe going back to the place where you live?: Yes      Need for Family Participation in Patient Care: Yes (Comment) (dialysis patient, seizures) Care giver support system in place?: Yes (comment) (wife) Current home services: DME [facility], wheelchair, shower stool) Criminal Activity/Legal Involvement Pertinent to Current Situation/Hospitalization: No - Comment as needed  Activities of Daily Living      Permission Sought/Granted Permission sought to share information with : Case Manager, Family Supports, Other (comment) Permission granted to share information with : Yes, Verbal Permission Granted  Share Information with NAME: [name]  Permission granted to share info w AGENCY: SNF's  Permission granted to share info w Relationship: wife     Emotional Assessment Appearance:: Appears older than stated age Attitude/Demeanor/Rapport: Engaged Affect (typically observed): Accepting Orientation: : Oriented to Self, Oriented to Place, Oriented to  Time, Oriented to Situation Alcohol / Substance Use: Not Applicable Psych Involvement: No (comment)  Admission diagnosis:  Hyperkalemia [E87.5] Hypomagnesemia [E83.42] Seizure (HCC) [R56.9] Abnormal ECG [R94.31] Hyperglycemia [R73.9] ESRD (end stage renal disease) ([place]) [N18.6] Troponin I above reference range [R77.8] Patient Active Problem List   Diagnosis Date Noted  . Seizure ([place]) 01/25/2020  . Depression   . Acute GI bleeding 07/28/2019  . Melena 07/22/2019  . History of GI bleed 07/22/2019  . Current every day smoker 06/22/2019  .  Falls frequently 06/22/2019  . Palliative care by specialist   . Goals of care, counseling/discussion   . Orthostasis 05/02/2019  . ESRD (end stage renal disease) on dialysis ([place])   . Acute blood loss anemia 04/30/2019  . Acute on chronic blood loss anemia 04/29/2019  . History of CVA (cerebrovascular accident) 04/29/2019  . GI bleed 04/04/2019  . 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[place])  03/20/2019  . Generalized weakness   . Diabetic polyneuropathy associated with type 2 diabetes mellitus ([place])   . Cerebrovascular accident (CVA) ([place])   . PAD (peripheral artery disease) ([place]) 03/17/2019  . Ptosis of eyelid, left 03/15/2019  . Physical deconditioning 03/14/2019  . Hypomagnesemia 03/13/2019  . Hyperphosphatemia 03/13/2019  . ESRD (end stage renal disease) ([place]) 03/13/2019  . Chronic kidney disease with peritoneal dialysis as preferred modality, stage 5 ([place]) 03/13/2019  . Prolonged QT interval 03/13/2019  . Hypertension associated with diabetes ([place]) 03/13/2019  . Hypothyroidism 03/13/2019  . Chronic diarrhea 03/13/2019  . Hypocalcemia 03/12/2019  . Ataxia 03/10/2019  . Dizziness 03/10/2019  . Vision problem 03/10/2019  . Hypokalemia 09/16/2018  . ESRD on peritoneal dialysis ([place]) 03/04/2018  . Lung nodule 06/12/2016  . Narrowing of airway   . Cigarette smoker 05/24/2016  . Collapse of right lung 05/24/2016  . 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 Diarrhea due to malabsorption 07/01/2015  . Hx of adenomatous colonic polyps 07/01/2015  . Acne 05/20/2015  . Calculus of kidney 05/20/2015  . Chest pain, non-cardiac 05/20/2015  . Seizure disorder ([place]) 05/20/2015  . Current tobacco use 05/20/2015  . Chronic kidney disease (CKD), stage III (moderate) ([place]) 03/29/2015  . Insulin dependent type 2 diabetes mellitus ([place]) 03/15/2015  . Lipoma of shoulder 03/08/2014  . Other synovitis and tenosynovitis, right shoulder 03/08/2014  . Right supraspinatus tenosynovitis 03/08/2014  . Bursitis of elbow 02/15/2014  . Olecranon bursitis of right elbow 02/15/2014  . Absolute anemia 08/08/2013  . 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 Back pain, chronic 08/08/2013  . Chronic obstructive pulmonary disease ([place]) 08/08/2013  . BP (high blood pressure) 08/08/2013  . Hyperlipidemia associated with type 2 diabetes mellitus ([place]) 08/08/2013  . Acne erythematosa  08/08/2013   PCP:  Idelle Crouch, MD Pharmacy:   [facility] [address] [place] [address] Phone: [phone] Fax: [place], [address] [address] Phone: [phone] Fax: [place], [place] [address] Phone: [phone] Fax: [phone]     Social Determinants of Health (SDOH) Interventions    Readmission Risk Interventions Readmission Risk Prevention Plan 01/26/2020  Transportation Screening Complete  Medication Review ([place]) Complete  PCP or Specialist appointment within 3-5 days of discharge Complete  HRI or Home Care Consult Complete  SW Recovery Care/Counseling Consult Complete  [place] Complete  Some recent data might be hidden

## 2020-01-26 NOTE — Progress Notes (Signed)
Notified Dr. Louanne Belton that patient continues to have watery stools. Per MD to order a rectal tube.

## 2020-01-26 NOTE — Consult Note (Signed)
Jarone, Ostergaard [id number] 13-Aug-1950 Flora Lipps, MD  Reason for Consult: Evaluate left maxillary sinus disease  HPI: The patient is a 69 year old white male who has a history of previous CVA and left-sided weakness, end-stage renal disease on hemodialysis, and seizure disorder who was brought to the EMS after a fall that occurred couple of days ago that seemed to be a seizure.  He had been on seizure medications previously and had had a recent EEG that showed no problems in his antiseizure medications had been stopped.  He has been restarted on seizure medications again.  His potassium was very high and he had dialysis to try to bring that down.  CT scans of his head show no evidence of intracranial problems however he has been noted to have opacity in his left maxillary sinus with some erosion of the lateral wall of the maxillary sinus.  I came yesterday to see the patient and he was in dialysis and sedated and unable to answer any questions.  This morning he is awake but a little confused.  He does not complain of any pain in his left cheek or headache there.  He states he gets a little bit of clear drainage but is not blowing out any purulent drainage from his nose.  He does not have a lot of sinus infections that he knows of.  He does not complain of any pain in his teeth or any pain in his cheek.  He states his vision is fine.  Allergies: No Known Allergies  ROS: Review of systems normal other than 12 systems except per HPI.  PMH:  Past Medical History:  Diagnosis Date  . Anemia   . Anxiety   . BPH (benign prostatic hyperplasia)   . Chronic kidney disease   . Colon polyps   . COPD (chronic obstructive pulmonary disease) ([place])   . Depression   . Diabetes mellitus without complication ([place])   . Diverticulosis of colon   . History of kidney stones   . Hyperlipidemia   . Hypertension   . Hypothyroidism   . Nephrolithiasis   . Seizures ([place])    2015 last seizure  . Stroke [facility])      FH:  Family History  Problem Relation Age of Onset  . Diabetes Mother   . Diabetes Maternal Grandmother   . Diabetes Maternal Grandfather   . Lung cancer Father   . Emphysema Paternal Grandfather     SH:  Social History   Socioeconomic History  . Marital status: Married    Spouse name: Not on file  . Number of children: Not on file  . Years of education: Not on file  . Highest education level: Not on file  Occupational History  . Not on file  Tobacco Use  . Smoking status: Light Tobacco Smoker    Packs/day: 0.25    Years: 40.00    Pack years: 10.00    Types: Cigarettes    Last attempt to quit: 05/25/2016    Years since quitting: 3.6  . Smokeless tobacco: Never Used  Vaping Use  . Vaping Use: Never used  Substance and Sexual Activity  . Alcohol use: Yes    Alcohol/week: 8.0 standard drinks    Types: 8 Cans of beer per week    Comment: beer or wine   . Drug use: No  . Sexual activity: Not on file  Other Topics Concern  . Not on file  Social History Narrative  . Not on file  Social Determinants of Health   Financial Resource Strain:   . Difficulty of Paying Living Expenses: Not on file  Food Insecurity:   . Worried About Charity fundraiser in the Last Year: Not on file  . Ran Out of Food in the Last Year: Not on file  Transportation Needs:   . Lack of Transportation (Medical): Not on file  . Lack of Transportation (Non-Medical): Not on file  Physical Activity:   . Days of Exercise per Week: Not on file  . Minutes of Exercise per Session: Not on file  Stress:   . Feeling of Stress : Not on file  Social Connections:   . Frequency of Communication with Friends and Family: Not on file  . Frequency of Social Gatherings with Friends and Family: Not on file  . Attends Religious Services: Not on file  . Active Member of Clubs or Organizations: Not on file  . Attends Archivist Meetings: Not on file  . Marital Status: Not on file  Intimate  Partner Violence:   . Fear of Current or Ex-Partner: Not on file  . Emotionally Abused: Not on file  . Physically Abused: Not on file  . Sexually Abused: Not on file    PSH:  Past Surgical History:  Procedure Laterality Date  . AV FISTULA PLACEMENT Left 03/30/2019   Procedure: right brachiocephalic fistula creation;  Surgeon: Marty Heck, MD;  Location: [place];  Service: Vascular;  Laterality: Left;  . BIOPSY  04/05/2019   Procedure: BIOPSY;  Surgeon: Wilford Corner, MD;  Location: [place];  Service: Endoscopy;;  . COLONOSCOPY    . COLONOSCOPY WITH PROPOFOL N/A 05/30/2015   Procedure: COLONOSCOPY WITH PROPOFOL;  Surgeon: Manya Silvas, MD;  [facility]COPY;  Service: Endoscopy;  Laterality: N/A;  . COLONOSCOPY WITH PROPOFOL N/A 12/01/2018   Procedure: COLONOSCOPY WITH PROPOFOL;  Surgeon: Toledo, [name], MD;  Location: [facility] ENDOSCOPY;  Service: Gastroenterology;  Laterality: N/A;  . COLONOSCOPY WITH PROPOFOL N/A 04/09/2019   Procedure: COLONOSCOPY WITH PROPOFOL;  Surgeon: Carol Ada, MD;  Location: [place];  Service: Endoscopy;  Laterality: N/A;  . ENTEROSCOPY N/A 05/05/2019   Procedure: ENTEROSCOPY;  Surgeon: Ronnette Juniper, MD;  Location[facility] ENDOSCOPY;  Service: Gastroenterology;  Laterality: N/A;  Push enteroscopy  . ENTEROSCOPY N/A 07/28/2019   Procedure: ENTEROSCOPY;  Surgeon: Virgel Manifold, MD;  Location[facility]COPY;  Service: Endoscopy;  Laterality: N/A;  . ESOPHAGOGASTRODUODENOSCOPY (EGD) WITH PROPOFOL N/A 04/05/2019   Procedure: ESOPHAGOGASTRODUODENOSCOPY (EGD) WITH PROPOFOL;  Surgeon: Wilford Corner, MD;  Location: [place];  Service: Endoscopy;  Laterality: N/A;  . GIVENS CAPSULE STUDY N/A 05/01/2019   Procedure: GIVENS CAPSULE STUDY;  Surgeon: Ronald Lobo, MD;  Location: [place];  Service: Endoscopy;  Laterality: N/A;  . HOT HEMOSTASIS N/A 04/09/2019   Procedure: HOT HEMOSTASIS (ARGON PLASMA COAGULATION/BICAP);  Surgeon: Carol Ada, MD;  Location: [place];  Service: Endoscopy;  Laterality: N/A;  . HOT HEMOSTASIS N/A 05/05/2019   Procedure: HOT HEMOSTASIS (ARGON PLASMA COAGULATION/BICAP);  Surgeon: Ronnette Juniper, MD;  Location: [place];  Service: Gastroenterology;  Laterality: N/A;  . INSERTION OF DIALYSIS CATHETER Right 03/30/2019   Procedure: Ultrasound guided right internal jugular tunneled dialysis catheter placement;  Surgeon: Marty Heck, MD;  Location: [facility] OR;  Service: Vascular;  Laterality: Right;  . kidney stone    . REMOVAL OF A DIALYSIS CATHETER Right 06/26/2019   Procedure: REMOVAL OF A DIALYSIS CATHETER ( PD CATH REMOVAL AND I  J CATH REMOVAL;  Surgeon: Katha Cabal, MD;  Location: [facility] ORS;  Service: Vascular;  Laterality: Right;  . THYROIDECTOMY    . VARICOCELE EXCISION    . VIDEO BRONCHOSCOPY Bilateral 05/25/2016   Procedure: VIDEO BRONCHOSCOPY WITHOUT FLUORO;  Surgeon: Juanito Doom, MD;  Location: [facility] ENDOSCOPY;  Service: Cardiopulmonary;  Laterality: Bilateral;    Physical  Exam: The patient's face looks fairly symmetrical although there could be just a little bit of asymmetry of his lower lid.  His left cheek is not tender nor is it swollen or firm.  I cannot feel any masses in the cheek.  Inside his mouth there is no oral lesions.  There is no swelling anywhere.  The front of his nose is open and clear bilaterally. he seems to be breathing well through his nose and does not have to mouth breathe.  His voice is clear and he has no hoarseness or cough.  His neck is negative for any nodes or masses.  Flexible scope was passed into the left nostril for a nasal endoscopy.  You can see creamy white-yellow mucus that is coming from the middle meatus and around his middle turbinate.  This is not on the floor of the nose.  I do not see any masses.  There is no black necrotic tissue or evidence of invasive fungal disease.  His right nostril is open and clear with no signs of mucus or  abnormal anatomy at all.  His septum is relatively straight great anteriorly but does have a spur posteriorly on the left side.  I reviewed his CT scans in detail from yesterday as well as from previously.  He has had opacity in his left maxillary sinus and now looks like a fungal ball, which is hard dried mucus in the sinus that cannot come out on its own.  He likely now has some bacterial overgrowth around it that is leading to the purulent drainage.  He has had some osteitis of his lateral sinus wall suggesting this is been going on for a long time and now there is some evidence for erosion.  He has some inflammation in his left ethmoid and frontal sinus as well but the right sinuses are totally clear.   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  There is erosion the lateral wall which is more suspicious for tumor but could occur from chronic inflammation here.  There is no evidence of trauma to his cheek from the fall or any fracture in this area.  He does have purulent drainage in his nose and needs to be started on an oral antibiotic like Augmentin and use decongestants like Mucinex D if possible to open up and help the sinus drain.  This may have some interaction with antiseizure medications.  Saline flushes may help wash out the nose.  After he has completed the antibiotics he can be seen in the office and arrangements made for an outpatient procedure to open the sinus and drain it and make sure there is no evidence for neoplasm.   Elon Alas Osha Rane 01/26/2020 7:29 AM

## 2020-01-26 NOTE — Progress Notes (Signed)
Entered patient room to assist techs with patient. MD notified of the following: episode of emesis and incontinent liquid stools x6. Excoriation to groin present. Pt also noted to have not had more than 87ml of urine output since 0700. Bladder scan performed for 333 ml. Pt denies need to void. Stool sample obtained and sent to lab along with MRSA swab.

## 2020-01-26 NOTE — Plan of Care (Signed)

## 2020-01-26 NOTE — NC FL2 (Signed)
[place] LEVEL OF CARE SCREENING TOOL     IDENTIFICATION  Patient Name: Kyle [name] Birthdate: 05-Apr-1950 Sex: male Admission Date (Current Location): 01/25/2020  [place] and [place] Number:  Engineering geologist and Address:  [facility], [address], [place], [place] [address]      Provider Number: [id number]  Attending Physician Name and Address:  Flora Lipps, MD  Relative Name and Phone Number:  Raydin Bielinski (wife) [phone]    Current Level of Care: Hospital Recommended Level of Care: [place] Prior Approval Number:    Date Approved/Denied:   PASRR Number: [id number]  Discharge Plan: SNF    Current Diagnoses: Patient Active Problem List   Diagnosis Date Noted  . Seizure ([place]) 01/25/2020  . Depression   . Acute GI bleeding 07/28/2019  . Melena 07/22/2019  . History of GI bleed 07/22/2019  . Current every day smoker 06/22/2019  . Falls frequently 06/22/2019  . Palliative care by specialist   . Goals of care, counseling/discussion   . Orthostasis 05/02/2019  . ESRD (end stage renal disease) on dialysis ([place])   . Acute blood loss anemia 04/30/2019  . Acute on chronic blood loss anemia 04/29/2019  . History of CVA (cerebrovascular accident) 04/29/2019  . GI bleed 04/04/2019  . 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[place]) 03/20/2019  . Generalized weakness   . Diabetic polyneuropathy associated with type 2 diabetes mellitus ([place])   . Cerebrovascular accident (CVA) ([place])   . PAD (peripheral artery disease) ([place]) 03/17/2019  . Ptosis of eyelid, left 03/15/2019  . Physical deconditioning 03/14/2019  . Hypomagnesemia 03/13/2019  . Hyperphosphatemia 03/13/2019  . ESRD (end stage renal disease) ([place]) 03/13/2019  . Chronic kidney disease with peritoneal dialysis as preferred modality, stage 5 ([place]) 03/13/2019  . Prolonged QT interval 03/13/2019  . Hypertension associated with diabetes ([place]) 03/13/2019  .  Hypothyroidism 03/13/2019  . Chronic diarrhea 03/13/2019  . Hypocalcemia 03/12/2019  . Ataxia 03/10/2019  . Dizziness 03/10/2019  . Vision problem 03/10/2019  . Hypokalemia 09/16/2018  . ESRD on peritoneal dialysis ([place]) 03/04/2018  . Lung nodule 06/12/2016  . Narrowing of airway   . Cigarette smoker 05/24/2016  . Collapse of right lung 05/24/2016  . COPD with chronic bronchitis ([place]) 05/24/2016  . Diarrhea due to malabsorption 07/01/2015  . Hx of adenomatous colonic polyps 07/01/2015  . Acne 05/20/2015  . Calculus of kidney 05/20/2015  . Chest pain, non-cardiac 05/20/2015  . Seizure disorder ([place]) 05/20/2015  . Current tobacco use 05/20/2015  . Chronic kidney disease (CKD), stage III (moderate) ([place]) 03/29/2015  . Insulin dependent type 2 diabetes mellitus ([place]) 03/15/2015  . Lipoma of shoulder 03/08/2014  . Other synovitis and tenosynovitis, right shoulder 03/08/2014  . Right supraspinatus tenosynovitis 03/08/2014  . Bursitis of elbow 02/15/2014  . Olecranon bursitis of right elbow 02/15/2014  . Absolute anemia 08/08/2013  . 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 Back pain, chronic 08/08/2013  . Chronic obstructive pulmonary disease ([place]) 08/08/2013  . BP (high blood pressure) 08/08/2013  . Hyperlipidemia associated with type 2 diabetes mellitus ([place]) 08/08/2013  . Acne erythematosa 08/08/2013    Orientation RESPIRATION BLADDER Height & Weight     Self, Time, Situation, Place  Normal External catheter Weight: 79.8 kg Height:  6' (182.9 cm)  BEHAVIORAL SYMPTOMS/MOOD NEUROLOGICAL BOWEL NUTRITION STATUS    Convulsions/Seizures Incontinent Diet  AMBULATORY STATUS COMMUNICATION OF NEEDS Skin   Extensive Assist Verbally Skin abrasions, Bruising (both arms)  Personal Care Assistance Level of Assistance  Bathing, Feeding, Dressing Bathing Assistance: Limited assistance Feeding assistance: Limited assistance Dressing Assistance: Limited  assistance     Functional Limitations [place]  PT (By licensed PT), OT (By licensed OT)     PT Frequency: 5 times per week OT Frequency: 5 times per week            Contractures Contractures Info: Not present    Additional Factors Info  Code Status, Allergies Code Status Info: Full Allergies Info: NKA           Current Medications (01/26/2020):  This is the current hospital active medication list Current Facility-Administered Medications  Medication Dose Route Frequency Provider Last Rate Last Admin  . 0.9 %  sodium chloride infusion  250 mL Intravenous PRN Agbata, Tochukwu, MD 10 mL/hr at 01/26/20 0904 250 mL at 01/26/20 0904  . amoxicillin-clavulanate (AUGMENTIN) 500-125 MG per tablet 500 mg  1 tablet Oral Q24H Pokhrel, Laxman, MD   500 mg at 01/26/20 1241  . atorvastatin (LIPITOR) tablet 10 mg  10 mg Oral Daily Agbata, Tochukwu, MD   10 mg at 01/26/20 0840  . calcium acetate (PHOSLO) capsule 1,334 mg  1,334 mg Oral TID WC Agbata, Tochukwu, MD   1,334 mg at 01/26/20 1241  . Chlorhexidine Gluconate Cloth 2 % PADS 6 each  6 each Topical Q0600 Murlean Iba, MD   6 each at 01/25/20 1301  . clopidogrel (PLAVIX) tablet 75 mg  75 mg Oral Daily Agbata, Tochukwu, MD   75 mg at 01/25/20 1544  . escitalopram (LEXAPRO) tablet 10 mg  10 mg Oral Daily Agbata, Tochukwu, MD   10 mg at 01/26/20 0843  . gabapentin (NEURONTIN) capsule 100 mg  100 mg Oral BID Agbata, Tochukwu, MD   100 mg at 01/26/20 0840  . heparin injection 5,000 Units  5,000 Units Subcutaneous Q8H Agbata, Tochukwu, MD   5,000 Units at 01/26/20 1241  . hydrOXYzine (ATARAX/VISTARIL) tablet 25 mg  25 mg Oral Q6H PRN Agbata, Tochukwu, MD      . insulin aspart (novoLOG) injection 0-9 Units  0-9 Units Subcutaneous TID WC Agbata, Tochukwu, MD   2 Units at 01/26/20 1241  . lacosamide (VIMPAT) 100 mg in sodium chloride 0.9 % 25 mL IVPB  100 mg Intravenous Q12H Kerney Elbe, MD   Stopped  at 01/26/20 0945  . levothyroxine (SYNTHROID) tablet 200 mcg  200 mcg Oral QAC breakfast Agbata, Tochukwu, MD   200 mcg at 01/26/20 0841  . lidocaine-prilocaine (EMLA) cream 1 application  1 application Topical Q T,Th,Sa-HD Agbata, Tochukwu, MD      . lipase/protease/amylase (CREON) capsule 24,000 Units  24,000 Units Oral TID WC Agbata, Tochukwu, MD   24,000 Units at 01/26/20 1241  . losartan (COZAAR) tablet 100 mg  100 mg Oral Daily Agbata, Tochukwu, MD   100 mg at 01/26/20 0841  . multivitamin (RENA-VIT) tablet 1 tablet  1 tablet Oral Daily Agbata, Tochukwu, MD   1 tablet at 01/26/20 0843  . polyethylene glycol powder (GLYCOLAX/MIRALAX) container 17 g  17 g Oral Daily PRN Agbata, Tochukwu, MD      . sodium chloride flush (NS) 0.9 % injection 3 mL  3 mL Intravenous Q12H Agbata, Tochukwu, MD   3 mL at 01/26/20 0843  . sodium chloride flush (NS) 0.9 % injection 3 mL  3 mL Intravenous PRN Agbata, Tochukwu, MD      .  traZODone (DESYREL) tablet 100 mg  100 mg Oral QHS Agbata, Tochukwu, MD   100 mg at 01/25/20 2156     Discharge Medications: Please see discharge summary for a list of discharge medications.  Relevant Imaging Results:  Relevant Lab Results:   Additional Information [phone] - ESRD Dialysis M,W,FRI  Shelbie Hutching, RN

## 2020-01-26 NOTE — Progress Notes (Signed)
EEG revealed continuous generalized slowing, maximal right temporal region. The study is suggestive of cortical dysfunction in right temporal region which could be secondary to underlying structural abnormality and/or post-ictal state. There is also moderate diffuse encephalopathy, nonspecific to etiology. No seizures or epileptiform discharges were seen throughout the recording.  A/R: Seizure recurrence in a 69 year old male with prior history of seizures. - Continue Vimpat - Outpatient Neurology follow up  - Per [facility] statutes, patients with seizures are not allowed to drive until  they have been seizure-free for six months. Use caution when using heavy equipment or power tools. Avoid working on ladders or at heights. Take showers instead of baths. Ensure the water temperature is not too high on the home water heater. Do not go swimming alone. When caring for infants or small children, sit down when holding, feeding, or changing them to minimize risk of injury to the child in the event you have a seizure. Also, Maintain good sleep hygiene. Avoid alcohol. - Neurohospitalist service will sign off. Please call if there are additional questions.   Electronically signed: Dr. Kerney Elbe

## 2020-01-26 NOTE — Progress Notes (Signed)
Pharmacy Antibiotic Note  Kyle [name] is a 69 y.o. male admitted on 01/25/2020 with seizure/fall/ sinus infection.  Pharmacy has been consulted for augmentin dosing.  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 Will start now and continue with further doses to be given in the evenings (after dialysis on dialysis days)    Height: 6' (182.9 cm) Weight: 79.8 kg (176 lb) IBW/kg (Calculated) : 77.6  Temp (24hrs), Avg:98.5 F (36.9 C), Min:97.6 F (36.4 C), Max:99 F (37.2 C)  Recent Labs  Lab 01/25/20 0816 01/26/20 0546  WBC 7.8 5.5  CREATININE 7.70* 5.04*    Estimated Creatinine Clearance: 14.8 mL/min (A) (by C-G formula based on SCr of 5.04 mg/dL (H)).    No Known Allergies  Antimicrobials this admission: Augmentin 11/30    >>     Dose adjustments this admission:    Microbiology results:   BCx:     UCx:      Sputum:      MRSA PCR:    Thank you for allowing pharmacy to be a part of this patient's care.  Kyle [name] A 01/26/2020 9:18 AM

## 2020-01-27 DIAGNOSIS — N186 End stage renal disease: Secondary | ICD-10-CM | POA: Diagnosis not present

## 2020-01-27 DIAGNOSIS — E1142 Type 2 diabetes mellitus with diabetic polyneuropathy: Secondary | ICD-10-CM | POA: Diagnosis not present

## 2020-01-27 DIAGNOSIS — I639 Cerebral infarction, unspecified: Secondary | ICD-10-CM | POA: Diagnosis not present

## 2020-01-27 DIAGNOSIS — E039 Hypothyroidism, unspecified: Secondary | ICD-10-CM | POA: Diagnosis not present

## 2020-01-27 LAB — COMPREHENSIVE METABOLIC PANEL
ALT: 8 U/L (ref 0–44)
AST: 11 U/L — ABNORMAL LOW (ref 15–41)
Albumin: 3.5 g/dL (ref 3.5–5.0)
Alkaline Phosphatase: 76 U/L (ref 38–126)
Anion gap: 15 (ref 5–15)
BUN: 34 mg/dL — ABNORMAL HIGH (ref 8–23)
CO2: 27 mmol/L (ref 22–32)
Calcium: 8.3 mg/dL — ABNORMAL LOW (ref 8.9–10.3)
Chloride: 96 mmol/L — ABNORMAL LOW (ref 98–111)
Creatinine, Ser: 6.5 mg/dL — ABNORMAL HIGH (ref 0.61–1.24)
GFR, Estimated: 9 mL/min — ABNORMAL LOW (ref >60)
Glucose, Bld: 111 mg/dL — ABNORMAL HIGH (ref 70–99)
Potassium: 4.3 mmol/L (ref 3.5–5.1)
Sodium: 138 mmol/L (ref 135–145)
Total Bilirubin: 1.3 mg/dL — ABNORMAL HIGH (ref 0.3–1.2)
Total Protein: 6.5 g/dL (ref 6.5–8.1)

## 2020-01-27 LAB — CBC
HCT: 35.9 % — ABNORMAL LOW (ref 39.0–52.0)
Hemoglobin: 11.5 g/dL — ABNORMAL LOW (ref 13.0–17.0)
MCH: 31.8 pg (ref 26.0–34.0)
MCHC: 32 g/dL (ref 30.0–36.0)
MCV: 99.2 fL (ref 80.0–100.0)
Platelets: 158 10*3/uL (ref 150–400)
RBC: 3.62 MIL/uL — ABNORMAL LOW (ref 4.22–5.81)
RDW: 17 % — ABNORMAL HIGH (ref 11.5–15.5)
WBC: 5.5 10*3/uL (ref 4.0–10.5)
nRBC: 0 % (ref 0.0–0.2)

## 2020-01-27 LAB — MAGNESIUM: Magnesium: 1.6 mg/dL — ABNORMAL LOW (ref 1.7–2.4)

## 2020-01-27 LAB — GLUCOSE, CAPILLARY
Glucose-Capillary: 106 mg/dL — ABNORMAL HIGH (ref 70–99)
Glucose-Capillary: 114 mg/dL — ABNORMAL HIGH (ref 70–99)
Glucose-Capillary: 117 mg/dL — ABNORMAL HIGH (ref 70–99)
Glucose-Capillary: 192 mg/dL — ABNORMAL HIGH (ref 70–99)
Glucose-Capillary: 78 mg/dL (ref 70–99)

## 2020-01-27 LAB — PHOSPHORUS: Phosphorus: 4.3 mg/dL (ref 2.5–4.6)

## 2020-01-27 LAB — HEPATITIS B SURFACE ANTIGEN: Hepatitis B Surface Ag: NONREACTIVE

## 2020-01-27 MED ORDER — HEPARIN SODIUM (PORCINE) 1000 UNIT/ML DIALYSIS
20.0000 [IU]/kg | INTRAMUSCULAR | Status: DC | PRN
  Filled 2020-01-27: qty 2

## 2020-01-27 MED ORDER — LOPERAMIDE HCL 2 MG PO CAPS
2.0000 mg | ORAL_CAPSULE | ORAL | Status: DC | PRN
  Administered 2020-01-28: 2 mg via ORAL
  Filled 2020-01-27: qty 1

## 2020-01-27 MED ORDER — LACOSAMIDE 50 MG PO TABS
100.0000 mg | ORAL_TABLET | Freq: Two times a day (BID) | ORAL | Status: DC
  Administered 2020-01-27 – 2020-01-28 (×2): 100 mg via ORAL
  Filled 2020-01-27 (×2): qty 2

## 2020-01-27 NOTE — TOC Progression Note (Signed)
Transition of Care [facility]) - Progression Note    Patient Details  Name: Kyle [name] MRN: [id number] Date of Birth: [birth year]  Transition of Care [facility]) CM/SW Contact  Shelbie Hutching, RN Phone Number: 01/27/2020, 1:46 PM  Clinical Narrative:    RNCM called and presented bed offer from [place] to patient's wife, [name].  [name] accepts bed offer.  Patient should be ready for discharge tomorrow or the next day.  RNCM will start insurance auth through [facility].     Expected Discharge Plan: [place] Barriers to Discharge: Continued Medical Work up  Expected Discharge Plan and Services Expected Discharge Plan: [place]   Discharge Planning Services: CM Consult Post Acute Care Choice: [place] Living arrangements for the past 2 months: Single Family Home                 DME Arranged: N/A DME Agency: NA       HH Arranged: NA           Social Determinants of Health (SDOH) Interventions    Readmission Risk Interventions Readmission Risk Prevention Plan 01/26/2020  Transportation Screening Complete  Medication Review Press photographer) Complete  PCP or Specialist appointment within 3-5 days of discharge Complete  HRI or Home Care Consult Complete  SW Recovery Care/Counseling Consult Complete  Palliative Care Screening Not [place] Complete  Some recent data might be hidden

## 2020-01-27 NOTE — TOC Progression Note (Signed)
Transition of Care [facility]) - Progression Note    Patient Details  Name: Kyle [name] MRN: [id number] Date of Birth: Jul 21, 1950  [facility] of [facility]) CM/SW Contact  Shelbie Hutching, RN Phone Number: 01/27/2020, 3:22 PM  Clinical Narrative:    Insurance authorization approved for [employer].  Approved for 5 days, start date 12/2 next review 12/6.  Navi ID [id number].  Care coordinator with [facility] is Eliezer Mccoy.    Expected Discharge Plan: [place] Barriers to Discharge: Continued Medical Work up  Expected Discharge Plan and Services Expected Discharge Plan: [place]   Discharge Planning Services: CM Consult Post Acute Care Choice: [place] Living arrangements for the past 2 months: Single Family Home                 DME Arranged: N/A DME Agency: NA       HH Arranged: NA           Social Determinants of Health (SDOH) Interventions    Readmission Risk Interventions Readmission Risk Prevention Plan 01/26/2020  Transportation Screening Complete  Medication Review Press photographer) Complete  PCP or Specialist appointment within 3-5 days of discharge Complete  HRI or Home Care Consult Complete  SW Recovery Care/Counseling Consult Complete  Palliative Care Screening Not [place] Complete  Some recent data might be hidden

## 2020-01-27 NOTE — Progress Notes (Signed)
Uneventful night with pt. Pt is easily awakened by my voice throughout the shift. He is alert to self and situation. No seizure like activity noted during the shift. Vitals remained within normal limits. Pt is currently sitting up in bed awake watching tv. No obvious signs of distress is noted.

## 2020-01-27 NOTE — Progress Notes (Signed)
PHARMACIST - PHYSICIAN COMMUNICATION  DR:  Louanne Belton  CONCERNING: IV to Oral Route Change Policy  RECOMMENDATION: This patient is receiving lacosamide by the intravenous route.  Based on criteria approved by the Pharmacy and Therapeutics Committee, the intravenous medication(s) is/are being converted to the equivalent oral dose form(s).   DESCRIPTION: These criteria include:  The patient is eating (either orally or via tube) and/or has been taking other orally administered medications for a least 24 hours  The patient has no evidence of active gastrointestinal bleeding or impaired GI absorption (gastrectomy, short bowel, patient on TNA or NPO).  If you have questions about this conversion, please contact the Pharmacy Department  []   [phone] )  [facility] [x]   [phone] )  [facility] []   [phone] )  [facility] []   [phone] )  [facility] []   [phone] )  [place], PharmD, BCPS Clinical Pharmacist 01/27/2020 9:34 AM

## 2020-01-27 NOTE — Progress Notes (Signed)
[facility]  ROUNDING NOTE   Subjective:   Patient presented to the ED on 01/25/2020 , with a possible seizure and  fall.  He has history of end-stage renal disease and is on hemodialysis Monday Wednesday and Friday.  Patient resting in bed, in no acute distress. He has a rectal tube with dark loose stools. He denies nausea, vomiting or abdominal pain.      Objective:  Vital signs in last 24 hours:  Temp:  [97.7 F (36.5 C)-98.7 F (37.1 C)] 98.7 F (37.1 C) (12/01 1200) Pulse Rate:  [87-94] 94 (12/01 1200) Resp:  [17-20] 20 (12/01 1200) BP: (147-176)/(69-91) 166/91 (12/01 1200) SpO2:  [97 %-100 %] 99 % (12/01 1200)  Weight change:  Filed Weights   01/25/20 0834  Weight: 79.8 kg    Intake/Output: I/O last 3 completed shifts: In: 790 [P.O.:720; IV Piggyback:70] Out: 725 [Urine:225; Other:500]   Intake/Output this shift:  No intake/output data recorded.  Physical Exam: General: Resting in bed, in no acute distress  Head:  Normocephalic, atraumatic  Eyes:  Sclerae and conjunctivae clear  Lungs:  l respirations symmetrical, lungs clear to auscultation bilaterally  Heart:  Regular  Abdomen:  Soft, nontender, nondistended  Extremities:  No peripheral edema  Neurologic:  Speech clear  Skin: No acute lesions or rashes  Access:  Left upper arm aVF    Basic Metabolic Panel: Recent Labs  Lab 01/25/20 0816 01/26/20 0546 01/26/20 2012 01/27/20 0340  NA 131* 136  --  138  K 6.0* 4.2  --  4.3  CL 91* 96*  --  96*  CO2 24 27  --  27  GLUCOSE 235* 113*  --  111*  BUN 43* 23  --  34*  CREATININE 7.70* 5.04*  --  6.50*  CALCIUM 7.9* 8.1*  --  8.3*  MG 1.4* 1.6* 1.6* 1.6*  PHOS  --   --   --  4.3    Liver Function Tests: Recent Labs  Lab 01/25/20 0816 01/27/20 0340  AST 16 11*  ALT 7 8  ALKPHOS 93 76  BILITOT 1.2 1.3*  PROT 6.8 6.5  ALBUMIN 3.5 3.5   No results for input(s): LIPASE, AMYLASE in the last 168 hours. No results for input(s):  AMMONIA in the last 168 hours.  CBC: Recent Labs  Lab 01/25/20 0816 01/26/20 0546 01/27/20 0340  WBC 7.8 5.5 5.5  NEUTROABS 6.2  --   --   HGB 11.7* 10.2* 11.5*  HCT 37.4* 32.0* 35.9*  MCV 99.2 97.6 99.2  PLT 170 147* 158    Cardiac Enzymes: No results for input(s): CKTOTAL, CKMB, CKMBINDEX, TROPONINI in the last 168 hours.  BNP: Invalid input(s): POCBNP  CBG: Recent Labs  Lab 01/26/20 1959 01/27/20 0031 01/27/20 0431 01/27/20 0922 01/27/20 1159  GLUCAP 99 117* 106* 114* 192*    Microbiology: Results for orders placed or performed during the hospital encounter of 01/25/20  Resp Panel by RT-PCR (Flu A&B, Covid) Nasopharyngeal Swab     Status: None   Collection Time: 01/25/20  8:16 AM   Specimen: Nasopharyngeal Swab; Nasopharyngeal(NP) swabs in vial transport medium  Result Value Ref Range Status   SARS Coronavirus 2 by RT PCR NEGATIVE NEGATIVE Final    Comment: (NOTE) SARS-CoV-2 target nucleic acids are NOT DETECTED.  The SARS-CoV-2 RNA is generally detectable in upper respiratory specimens during the acute phase of infection. The lowest concentration of SARS-CoV-2 viral copies this assay can detect is 138 copies/mL.  A negative result does not preclude SARS-Cov-2 infection and should not be used as the sole basis for treatment or other patient management decisions. A negative result may occur with  improper specimen collection/handling, submission of specimen other than nasopharyngeal swab, presence of viral mutation(s) within the areas targeted by this assay, and inadequate number of viral copies(<138 copies/mL). A negative result must be combined with clinical observations, patient history, and epidemiological information. The expected result is Negative.  Fact Sheet for Patients:  EntrepreneurPulse.com.au  Fact Sheet for Healthcare Providers:  IncredibleEmployment.be  This test is no t yet approved or cleared by the  [place] FDA and  has been authorized for detection and/or diagnosis of SARS-CoV-2 by FDA under an Emergency Use Authorization (EUA). This EUA will remain  in effect (meaning this test can be used) for the duration of the COVID-19 declaration under Section 564(b)(1) of the Act, 21 U.S.C.section 360bbb-3(b)(1), unless the authorization is terminated  or revoked sooner.       Influenza A by PCR NEGATIVE NEGATIVE Final   Influenza B by PCR NEGATIVE NEGATIVE Final    Comment: (NOTE) The Xpert Xpress SARS-CoV-2/FLU/RSV plus assay is intended as an aid in the diagnosis of influenza from Nasopharyngeal swab specimens and should not be used as a sole basis for treatment. Nasal washings and aspirates are unacceptable for Xpert Xpress SARS-CoV-2/FLU/RSV testing.  Fact Sheet for Patients: EntrepreneurPulse.com.au  Fact Sheet for Healthcare Providers: IncredibleEmployment.be  This test is not yet approved or cleared by the [place] FDA and has been authorized for detection and/or diagnosis of SARS-CoV-2 by FDA under an Emergency Use Authorization (EUA). This EUA will remain in effect (meaning this test can be used) for the duration of the COVID-19 declaration under Section 564(b)(1) of the Act, 21 U.S.C. section 360bbb-3(b)(1), unless the authorization is terminated or revoked.  Performed at [facility], [address] [place], [place] [address]   MRSA PCR Screening     Status: Abnormal   Collection Time: 01/26/20  6:05 PM   Specimen: Nasopharyngeal  Result Value Ref Range Status   MRSA by PCR POSITIVE (A) NEGATIVE Final    Comment:        The GeneXpert MRSA Assay (FDA approved for NASAL specimens only), is one component of a comprehensive MRSA colonization surveillance program. It is not intended to diagnose MRSA infection nor to guide or monitor treatment for MRSA infections. RESULT CALLED TO, READ BACK BY AND VERIFIED  WITH: Ilene Qua 1931 01/26/2020 DB Performed at [facility], [address] [place], [place] [address]   C Difficile Quick Screen w PCR reflex     Status: Abnormal   Collection Time: 01/26/20  6:05 PM   Specimen: STOOL  Result Value Ref Range Status   C Diff antigen POSITIVE (A) NEGATIVE Final   C Diff toxin NEGATIVE NEGATIVE Final   C Diff interpretation Results are indeterminate. See PCR results.  Final    Comment: Performed at [facility], [address] [place], [place] [address]  C. Diff by PCR, Reflexed     Status: None   Collection Time: 01/26/20  6:05 PM  Result Value Ref Range Status   Toxigenic C. Difficile by PCR NEGATIVE NEGATIVE Final    Comment: Patient is colonized with non toxigenic C. difficile. May not need treatment unless significant symptoms are present. Performed at [facility], [address] [place], [place] [address]     Coagulation Studies: No results for input(s):  LABPROT, INR in the last 72 hours.  Urinalysis: No results for input(s): COLORURINE, LABSPEC, PHURINE, GLUCOSEU, HGBUR, BILIRUBINUR, KETONESUR, PROTEINUR, UROBILINOGEN, NITRITE, LEUKOCYTESUR in the last 72 hours.  Invalid input(s): APPERANCEUR    Imaging: EEG  Result Date: 01/25/2020 Lora Havens, MD     01/25/2020  4:38 PM Patient Name: Kyle [name] MRN: [id number] Epilepsy Attending: Lora Havens Referring Physician/Provider: Dr Kerney Elbe Date: 01/25/2020 Duration: 27.53 mins Patient history: 69 year old male presenting with seizure recurrence after recent discontinuation of anticonvulsant. EEG to evaluate for seizure Level of alertness: Awake AEDs during EEG study: GBP, LCM Technical aspects: This EEG study was done with scalp electrodes positioned according to the 10-20 International system of electrode placement. Electrical activity was acquired at a sampling rate of 500Hz  and reviewed with a high frequency filter of 70Hz  and a low  frequency filter of 1Hz . EEG data were recorded continuously and digitally stored. Description: No posterior dominant rhythm was seen. EEG showed continuous generalized and maximal right temporal region 5 to 6 Hz theta slowing as well as 2-3hz  delta slowing in right temporal region. Physiologic photic driving was not seen during photic stimulation.  Hyperventilation was not performed.   ABNORMALITY -Continuous slow, generalized and maximal right temporal region IMPRESSION: This study is suggestive of cortical dysfunction in right temporal region which could be secondary to underlying structural abnormality, post-ictal state. There is also moderate diffuse encephalopathy, nonspecific etiology. No seizures or epileptiform discharges were seen throughout the recording. Priyanka Barbra Sarks     Medications:   . sodium chloride 250 mL (01/26/20 0904)  . sodium chloride 50 mL/hr at 01/27/20 0959   . amoxicillin-clavulanate  1 tablet Oral Q24H  . atorvastatin  10 mg Oral Daily  . calcium acetate  1,334 mg Oral TID WC  . Chlorhexidine Gluconate Cloth  6 each Topical Q0600  . Chlorhexidine Gluconate Cloth  6 each Topical Q0600  . clopidogrel  75 mg Oral Daily  . escitalopram  10 mg Oral Daily  . gabapentin  100 mg Oral BID  . heparin  5,000 Units Subcutaneous Q8H  . insulin aspart  0-9 Units Subcutaneous TID WC  . lacosamide  100 mg Oral BID  . levothyroxine  200 mcg Oral QAC breakfast  . lidocaine-prilocaine  1 application Topical Q T,Th,Sa-HD  . lipase/protease/amylase  24,000 Units Oral TID WC  . losartan  100 mg Oral Daily  . magnesium oxide  400 mg Oral BID  . multivitamin  1 tablet Oral Daily  . mupirocin ointment  1 application Nasal BID  . nystatin   Topical TID  . ondansetron (ZOFRAN) IV  4 mg Intravenous Once  . sodium chloride flush  3 mL Intravenous Q12H  . traZODone  100 mg Oral QHS   sodium chloride, heparin, hydrOXYzine, sodium chloride flush  Assessment/ Plan:  Kyle [name] is a 68 y.o.  male with medical problems  including CVA with left-sided weakness, ESRD on HD MWF, seizure disorder, DM, hypertension, presented to the emergency department for a possible seizure at home.  #End-stage renal disease on HD MWF Planning for dialysis today We will continue Monday Wednesday Friday schedule  #Hyperkalemia Potassium stable at 4.3 today  #Diabetes type 2 with CKD Lab Results  Component Value Date   HGBA1C 8.1 (H) 01/26/2020  Blood glucose this morning was 111 Patient is on insulin aspart sliding scale  #Anemia with CKD Lab Results  Component Value Date   HGB 11.5 (  L) 01/27/2020  No acute indication for Epogen  #Secondary hyperparathyroidism Lab Results  Component Value Date   PTH 91 (H) 03/13/2019   CALCIUM 8.3 (L) 01/27/2020   CAION 0.93 (L) 04/05/2019   PHOS 4.3 01/27/2020  We will continue monitoring bone mineral metabolism parameters   #Hypertension Slightly above goal, on Losartan    LOS: 2 Criselda Starke 12/1/202112:25 PM

## 2020-01-27 NOTE — Progress Notes (Signed)
C. Difficile PCR toxigenic test was negative. Enteric isolation is not needed. Standard precautions are appropriate at this time.

## 2020-01-27 NOTE — Evaluation (Signed)
Physical Therapy Evaluation Patient Details Name: Sylvanus Telford MRN: [id number] DOB: 11/08/1950 Today's Date: 01/27/2020   History of Present Illness  Kyle [name] is a 69 y.o. male with medical history significant for CVA with left-sided weakness, end-stage renal disease on hemodialysis (M/W/F), history of seizure disorder, diabetes mellitus, hypertension who was brought into the emergency room by EMS for evaluation of a fall that occurred on the morning of his admission and concern for seizure per patient.  Clinical Impression  Patient received in bed, agrees to PT. Poor STM, asking me who I am and my name several times during session. Patient requires mod assist for supine to sit and mod assist to maintain sitting balance at edge of bed. Posterior leaning, requires cues to lean trunk forward. Patient is able to stand with mod assist. Attempted to take a couple of steps at edge of bed but was unsuccessful. Patient returned to supine with mod assist. Poor body/spatial awareness. Poor awareness of limitations and safety. Patient will continue to benefit from skilled PT while here to improve safety, strength and functional independence.     Follow Up Recommendations SNF;Supervision/Assistance - 24 hour    Equipment Recommendations  None recommended by PT    Recommendations for Other Services       Precautions / Restrictions Precautions Precautions: Fall Restrictions Weight Bearing Restrictions: No      Mobility  Bed Mobility Overal bed mobility: Needs Assistance Bed Mobility: Supine to Sit;Sit to Supine     Supine to sit: Mod assist Sit to supine: Mod assist   General bed mobility comments: Patient falling posteriorly with attempting to sit at edge of bed. Requiring hands on assist for balance. Difficulty raising trunk into sitting despite cues.    Transfers Overall transfer level: Needs assistance Equipment used: Rolling walker (2 wheeled) Transfers: Sit to/from  Stand Sit to Stand: Mod assist         General transfer comment: Increased time to get fully standing, requires mod assist for sit to stand, poor balance in standing  Ambulation/Gait             General Gait Details: patient not safe to attempt ambulation at this time  Stairs            Wheelchair Mobility    Modified Rankin (Stroke Patients Only)       Balance Overall balance assessment: Needs assistance Sitting-balance support: Feet supported Sitting balance-Leahy Scale: Poor   Postural control: Posterior lean;Right lateral lean Standing balance support: Bilateral upper extremity supported;During functional activity Standing balance-Leahy Scale: Poor Standing balance comment: reliant on RW and external assist to attain and maintain standing balance                             Pertinent Vitals/Pain Pain Assessment: No/denies pain    Home Living Family/patient expects to be discharged to:: Skilled nursing facility Living Arrangements: Spouse/significant other                    Prior Function Level of Independence: Needs assistance   Gait / Transfers Assistance Needed: RW at baseline  ADL's / Homemaking Assistance Needed: Reports requiring assist PRN for meals, LBD, bathing        Hand Dominance        Extremity/Trunk Assessment   Upper Extremity Assessment Upper Extremity Assessment: Generalized weakness    Lower Extremity Assessment Lower Extremity Assessment: Generalized weakness  Cervical / Trunk Assessment Cervical / Trunk Assessment: Normal  Communication   Communication: No difficulties  Cognition Arousal/Alertness: Awake/alert Behavior During Therapy: WFL for tasks assessed/performed Overall Cognitive Status: No family/caregiver present to determine baseline cognitive functioning Area of Impairment: Safety/judgement;Following commands;Memory;Awareness;Problem solving                     Memory:  Decreased short-term memory Following Commands: Follows one step commands inconsistently;Follows one step commands with increased time Safety/Judgement: Decreased awareness of deficits;Decreased awareness of safety Awareness: Emergent Problem Solving: Requires verbal cues;Requires tactile cues        General Comments      Exercises     Assessment/Plan    PT Assessment Patient needs continued PT services  PT Problem List Decreased strength;Decreased mobility;Decreased activity tolerance;Decreased balance;Decreased cognition;Decreased safety awareness       PT Treatment Interventions DME instruction;Therapeutic activities;Gait training;Therapeutic exercise;Patient/family education;Functional mobility training;Balance training    PT Goals (Current goals can be found in the Care Plan section)  Acute Rehab PT Goals Patient Stated Goal: none stated PT Goal Formulation: With patient Time For Goal Achievement: 02/10/20 Potential to Achieve Goals: Fair    Frequency Min 2X/week   Barriers to discharge        Co-evaluation               AM-PAC PT "6 Clicks" Mobility  Outcome Measure Help needed turning from your back to your side while in a flat bed without using bedrails?: A Lot Help needed moving from lying on your back to sitting on the side of a flat bed without using bedrails?: A Lot Help needed moving to and from a bed to a chair (including a wheelchair)?: Total Help needed standing up from a chair using your arms (e.g., wheelchair or bedside chair)?: A Lot Help needed to walk in hospital room?: Total Help needed climbing 3-5 steps with a railing? : Total 6 Click Score: 9    End of Session Equipment Utilized During Treatment: Gait belt Activity Tolerance: Patient limited by fatigue Patient left: in bed;with call bell/phone within reach;with bed alarm set Nurse Communication: Mobility status PT Visit Diagnosis: Unsteadiness on feet (R26.81);Other abnormalities of  gait and mobility (R26.89);Muscle weakness (generalized) (M62.81);History of falling (Z91.81);Difficulty in walking, not elsewhere classified (R26.2)    Time: 1100-1133 PT Time Calculation (min) (ACUTE ONLY): 33 min   Charges:   PT Evaluation $PT Eval Moderate Complexity: 1 Mod PT Treatments $Therapeutic Activity: 8-22 mins        Ziyan Hillmer, PT, GCS 01/27/20,12:41 PM

## 2020-01-27 NOTE — Progress Notes (Signed)
Heparin administered at this time due to increase with venous pressure >230.

## 2020-01-27 NOTE — Care Management Important Message (Signed)
Important Message  Patient Details  Name: Kyle [name] MRN: [id number] Date of Birth: 01/30/1951   Medicare Important Message Given:  Yes     Shelbie Hutching, RN 01/27/2020, 1:01 PM

## 2020-01-27 NOTE — Progress Notes (Addendum)
PROGRESS NOTE  Kyle [name] TMH:[id number] DOB: Nov 19, 1950 DOA: 01/25/2020 PCP: Idelle Crouch, MD   LOS: 2 days   Brief narrative: As per HPI,  Kyle [name] is a 69 y.o. male with medical history significant for CVA with left-sided weakness, end-stage renal disease on hemodialysis (M/W/F), history of seizure disorder, diabetes mellitus, hypertension was brought into the emergency room by EMS for evaluation of a fall that occurred on the morning of this admission and concern for seizure per patient.    In the emergency department patient was thought to have seizure-like activity.  He was given Keppra load and remained postictal..  His wife stated that he had an EEG done recently by his neurologist and that his Keppra was discontinued because the EEG did not show any evidence of seizures.  Laboratory data showed mild hyponatremia and mild hyper kalemia with elevated blood glucose level.  Creatinine elevated at 7.7.  Magnesium of 1.4. CT scan of the head without contrast shows no acute intracranial abnormalities.  Significant left-sided sinus disease, which has progressed since the prior CT.  Left maxillary sinus is completely opacified and there is a new defect along the lower lateral wall.    ENT was consulted from the ED for this finding.  Cervical spine CT shows no fracture or acute skeletal abnormality.  Small right pleural effusion.  Chest x-ray showed basilar opacities.   In the ED, patient received calcium gluconate and insulin IV and was admitted to hospital.   Assessment/Plan:  Principal Problem:   Seizure ([place]) Active Problems:   ESRD (end stage renal disease) ([place])   Hypothyroidism   Cerebrovascular accident (CVA) ([place])   Diabetic polyneuropathy associated with type 2 diabetes mellitus ([place])   Depression   Seizure with postictal state Patient with a history of seizures on Keppra which was recently discontinued after a " normal EEG".  Had witnessed seizure in the  ED and Keppra load was given.  Neurology was consulted and subsequently this has been discontinued and patient has been started on Vimpat.  No seizures reported.  Patient is more alert awake and communicative today.  Left maxillary sinus disease.  Seen by ENT by Margaretha Sheffield.  On Augmentin.  Patient's a fungal ball in the nasal cavity.  Will need to see ENT as outpatient.  Nausea and vomiting concern for possible dysphagia.  Speech and swallow has been consulted.  Will follow recommendations.  Chronic diarrhea. Not new. On pancreatic supplements. Seen by GI in the past and was supposed to be on creon.  c diff negative.  Hyperkalemia On presentation.  Received temporizing measures.  Received hemodialysis.  Will closely monitor. Potassium has normalized to 4.3 today.  History of CVA with left-sided weakness  Continue Plavix and statins. Check PT evaluation  Diabetes mellitus with end-stage renal disease.  Continue sliding scale insulin, Accu-Cheks, diabetic diet. Controlled at this time.  Latest hemoglobin A1c of 8.1.  Latest POC glucose of 192  ESRD on hemodialysis.  Nephrology on board.  Continue hemodialysis.  Depression On trazodone and Lexapro at home.  Hypothyroidism Continue Synthroid  Hypomagnesemia Mildly low.  We will supplement.  Check levels in a.m.  DVT prophylaxis: Place and maintain sequential compression device Start: 01/26/20 1310 heparin injection 5,000 Units Start: 01/25/20 1400   Code Status: Full code  Family Communication: Spoke with the patient's wife Ms. [name] on the phone and updated her about the clinical condition of the patient.  Status is: Inpatient  Remains inpatient appropriate  because:IV treatments appropriate due to intensity of illness or inability to take PO and Inpatient level of care appropriate due to severity of illness , likely need skilled nursing facility placement.   Dispo: The patient is from: Home              Anticipated  d/c is to: Patient has been seen by physical therapy recommend skilled nursing facility.                Anticipated d/c date is: 2 days              Patient currently is not medically stable to d/c.  Consultants:  Neurology  Nephrology  ENT  Procedures:  Hemodialysis  EEG  Antibiotics:  . Augmentin p.o.  Anti-infectives (From admission, onward)   Start     Dose/Rate Route Frequency Ordered Stop   01/26/20 1100  amoxicillin-clavulanate (AUGMENTIN) 500-125 MG per tablet 500 mg        1 tablet Oral Every 24 hours 01/26/20 0918       Subjective:  Today, patient was seen and examined at bedside.  No nausea, vomiting abdominal pain today.  Patient had diarrhea, nausea, vomiting yesterday.  Patient's wife reported that he has been having a lot of diarrhea at home which is chronic.  Denies cough, congestion chest pain.  Objective: Vitals:   01/27/20 0825 01/27/20 1200  BP: (!) 176/87 (!) 166/91  Pulse: 89 94  Resp: 18 20  Temp: 97.7 F (36.5 C) 98.7 F (37.1 C)  SpO2: 98% 99%    Intake/Output Summary (Last 24 hours) at 01/27/2020 1327 Last data filed at 01/27/2020 0527 Gross per 24 hour  Intake 480 ml  Output 125 ml  Net 355 ml   Filed Weights   01/25/20 0834  Weight: 79.8 kg   Body mass index is 23.87 kg/m.   Physical Exam: GENERAL: Patient is alert awake and oriented to place and person. Not in obvious distress. HENT: No scleral pallor or icterus. Pupils equally reactive to light. Oral mucosa is mildly dry NECK: is supple, no gross swelling noted. CHEST: Clear to auscultation. No crackles or wheezes.  Diminished breath sounds bilaterally. CVS: S1 and S2 heard, no murmur. Regular rate and rhythm.  ABDOMEN: Soft, non-tender, bowel sounds are present. EXTREMITIES: No edema. CNS: Cranial nerves are intact. No focal motor deficits. SKIN: warm and dry without rashes.  Data Review: I have personally reviewed the following laboratory data and  studies,  CBC: Recent Labs  Lab 01/25/20 0816 01/26/20 0546 01/27/20 0340  WBC 7.8 5.5 5.5  NEUTROABS 6.2  --   --   HGB 11.7* 10.2* 11.5*  HCT 37.4* 32.0* 35.9*  MCV 99.2 97.6 99.2  PLT 170 147* 409   Basic Metabolic Panel: Recent Labs  Lab 01/25/20 0816 01/26/20 0546 01/26/20 2012 01/27/20 0340  NA 131* 136  --  138  K 6.0* 4.2  --  4.3  CL 91* 96*  --  96*  CO2 24 27  --  27  GLUCOSE 235* 113*  --  111*  BUN 43* 23  --  34*  CREATININE 7.70* 5.04*  --  6.50*  CALCIUM 7.9* 8.1*  --  8.3*  MG 1.4* 1.6* 1.6* 1.6*  PHOS  --   --   --  4.3   Liver Function Tests: Recent Labs  Lab 01/25/20 0816 01/27/20 0340  AST 16 11*  ALT 7 8  ALKPHOS 93 76  BILITOT 1.2 1.3*  PROT 6.8 6.5  ALBUMIN 3.5 3.5   No results for input(s): LIPASE, AMYLASE in the last 168 hours. No results for input(s): AMMONIA in the last 168 hours. Cardiac Enzymes: No results for input(s): CKTOTAL, CKMB, CKMBINDEX, TROPONINI in the last 168 hours. BNP (last 3 results) Recent Labs    01/25/20 0816  BNP 2,916.1*    ProBNP (last 3 results) No results for input(s): PROBNP in the last 8760 hours.  CBG: Recent Labs  Lab 01/26/20 1959 01/27/20 0031 01/27/20 0431 01/27/20 0922 01/27/20 1159  GLUCAP 99 117* 106* 114* 192*   Recent Results (from the past 240 hour(s))  Resp Panel by RT-PCR (Flu A&B, Covid) Nasopharyngeal Swab     Status: None   Collection Time: 01/25/20  8:16 AM   Specimen: Nasopharyngeal Swab; Nasopharyngeal(NP) swabs in vial transport medium  Result Value Ref Range Status   SARS Coronavirus 2 by RT PCR NEGATIVE NEGATIVE Final    Comment: (NOTE) SARS-CoV-2 target nucleic acids are NOT DETECTED.  The SARS-CoV-2 RNA is generally detectable in upper respiratory specimens during the acute phase of infection. The lowest concentration of SARS-CoV-2 viral copies this assay can detect is 138 copies/mL. A negative result does not preclude SARS-Cov-2 infection and should not be  used as the sole basis for treatment or other patient management decisions. A negative result may occur with  improper specimen collection/handling, submission of specimen other than nasopharyngeal swab, presence of viral mutation(s) within the areas targeted by this assay, and inadequate number of viral copies(<138 copies/mL). A negative result must be combined with clinical observations, patient history, and epidemiological information. The expected result is Negative.  Fact Sheet for Patients:  EntrepreneurPulse.com.au  Fact Sheet for Healthcare Providers:  IncredibleEmployment.be  This test is no t yet approved or cleared by the [place] FDA and  has been authorized for detection and/or diagnosis of SARS-CoV-2 by FDA under an Emergency Use Authorization (EUA). This EUA will remain  in effect (meaning this test can be used) for the duration of the COVID-19 declaration under Section 564(b)(1) of the Act, 21 U.S.C.section 360bbb-3(b)(1), unless the authorization is terminated  or revoked sooner.       Influenza A by PCR NEGATIVE NEGATIVE Final   Influenza B by PCR NEGATIVE NEGATIVE Final    Comment: (NOTE) The Xpert Xpress SARS-CoV-2/FLU/RSV plus assay is intended as an aid in the diagnosis of influenza from Nasopharyngeal swab specimens and should not be used as a sole basis for treatment. Nasal washings and aspirates are unacceptable for Xpert Xpress SARS-CoV-2/FLU/RSV testing.  Fact Sheet for Patients: EntrepreneurPulse.com.au  Fact Sheet for Healthcare Providers: IncredibleEmployment.be  This test is not yet approved or cleared by the [place] FDA and has been authorized for detection and/or diagnosis of SARS-CoV-2 by FDA under an Emergency Use Authorization (EUA). This EUA will remain in effect (meaning this test can be used) for the duration of the COVID-19 declaration under Section  564(b)(1) of the Act, 21 U.S.C. section 360bbb-3(b)(1), unless the authorization is terminated or revoked.  Performed at [facility], [address] [place], [place] [address]   MRSA PCR Screening     Status: Abnormal   Collection Time: 01/26/20  6:05 PM   Specimen: Nasopharyngeal  Result Value Ref Range Status   MRSA by PCR POSITIVE (A) NEGATIVE Final    Comment:        The GeneXpert MRSA Assay (FDA approved for NASAL specimens only), is one component of a comprehensive MRSA colonization  surveillance program. It is not intended to diagnose MRSA infection nor to guide or monitor treatment for MRSA infections. RESULT CALLED TO, READ BACK BY AND VERIFIED WITH: Ilene Qua 1931 01/26/2020 DB Performed at [facility], [address] [place], [place] [address]   C Difficile Quick Screen w PCR reflex     Status: Abnormal   Collection Time: 01/26/20  6:05 PM   Specimen: STOOL  Result Value Ref Range Status   C Diff antigen POSITIVE (A) NEGATIVE Final   C Diff toxin NEGATIVE NEGATIVE Final   C Diff interpretation Results are indeterminate. See PCR results.  Final    Comment: Performed at [facility], [address] [place], [place] [address]  C. Diff by PCR, Reflexed     Status: None   Collection Time: 01/26/20  6:05 PM  Result Value Ref Range Status   Toxigenic C. Difficile by PCR NEGATIVE NEGATIVE Final    Comment: Patient is colonized with non toxigenic C. difficile. May not need treatment unless significant symptoms are present. Performed at [facility], [address] [place], [place] [address]      Studies: EEG  Result Date: 01/25/2020 Lora Havens, MD     01/25/2020  4:38 PM Patient Name: Kyle [name] MRN: [id number] Epilepsy Attending: Lora Havens Referring Physician/Provider: Dr Kerney Elbe Date: 01/25/2020 Duration: 27.53 mins Patient history: 69 year old male presenting with seizure recurrence after  recent discontinuation of anticonvulsant. EEG to evaluate for seizure Level of alertness: Awake AEDs during EEG study: GBP, LCM Technical aspects: This EEG study was done with scalp electrodes positioned according to the 10-20 International system of electrode placement. Electrical activity was acquired at a sampling rate of 500Hz  and reviewed with a high frequency filter of 70Hz  and a low frequency filter of 1Hz . EEG data were recorded continuously and digitally stored. Description: No posterior dominant rhythm was seen. EEG showed continuous generalized and maximal right temporal region 5 to 6 Hz theta slowing as well as 2-3hz  delta slowing in right temporal region. Physiologic photic driving was not seen during photic stimulation.  Hyperventilation was not performed.   ABNORMALITY -Continuous slow, generalized and maximal right temporal region IMPRESSION: This study is suggestive of cortical dysfunction in right temporal region which could be secondary to underlying structural abnormality, post-ictal state. There is also moderate diffuse encephalopathy, nonspecific etiology. No seizures or epileptiform discharges were seen throughout the recording. Priyanka Hubert Azure, MD  [facility] 01/27/2020

## 2020-01-27 NOTE — Evaluation (Addendum)
Clinical/Bedside Swallow Evaluation Patient Details  Name: Kyle [name] MRN: [id number] Date of Birth: October 21, 1950  Today's Date: 01/27/2020 Time: SLP Start Time (ACUTE ONLY): 1000 SLP Stop Time (ACUTE ONLY): 1100 SLP Time Calculation (min) (ACUTE ONLY): 60 min  Past Medical History:  Past Medical History:  Diagnosis Date  . Anemia   . Anxiety   . BPH (benign prostatic hyperplasia)   . Chronic kidney disease   . Colon polyps   . COPD (chronic obstructive pulmonary disease) ([place])   . Depression   . Diabetes mellitus without complication ([place])   . Diverticulosis of colon   . History of kidney stones   . Hyperlipidemia   . Hypertension   . Hypothyroidism   . Nephrolithiasis   . Seizures ([place])    2015 last seizure  . Stroke [facility])    Past Surgical History:  Past Surgical History:  Procedure Laterality Date  . AV FISTULA PLACEMENT Left 03/30/2019   Procedure: right brachiocephalic fistula creation;  Surgeon: Marty Heck, MD;  Location: [place];  Service: Vascular;  Laterality: Left;  . BIOPSY  04/05/2019   Procedure: BIOPSY;  Surgeon: Wilford Corner, MD;  Location: [place];  Service: Endoscopy;;  . COLONOSCOPY    . COLONOSCOPY WITH PROPOFOL N/A 05/30/2015   Procedure: COLONOSCOPY WITH PROPOFOL;  Surgeon: Manya Silvas, MD;  Location: [facility] ENDOSCOPY;  Service: Endoscopy;  Laterality: N/A;  . COLONOSCOPY WITH PROPOFOL N/A 12/01/2018   Procedure: COLONOSCOPY WITH PROPOFOL;  Surgeon: Tol[place], [name], MD;  Location: [facility] ENDOSCOPY;  Service: Gastroenterology;  Laterality: N/A;  . COLONOSCOPY WITH PROPOFOL N/A 04/09/2019   Procedure: COLONOSCOPY WITH PROPOFOL;  Surgeon: Carol Ada, MD;  Location: [place];  Service: Endoscopy;  Laterality: N/A;  . ENTEROSCOPY N/A 05/05/2019   Procedure: ENTEROSCOPY;  Surgeon: Ronnette Juniper, MD;  Location: [facility] ENDOSCOPY;  Service: Gastroenterology;  Laterality: N/A;  Push enteroscopy  . ENTEROSCOPY N/A 07/28/2019   Procedure:  ENTEROSCOPY;  Surgeon: Virgel Manifold, MD;  Location: [facility] ENDOSCOPY;  Service: Endoscopy;  Laterality: N/A;  . ESOPHAGOGASTRODUODENOSCOPY (EGD) WITH PROPOFOL N/A 04/05/2019   Procedure: ESOPHAGOGASTRODUODENOSCOPY (EGD) WITH PROPOFOL;  Surgeon: Wilford Corner, MD;  Location: [place];  Service: Endoscopy;  Laterality: N/A;  . GIVENS CAPSULE STUDY N/A 05/01/2019   Procedure: GIVENS CAPSULE STUDY;  Surgeon: Ronald Lobo, MD;  Location: [place];  Service: Endoscopy;  Laterality: N/A;  . HOT HEMOSTASIS N/A 04/09/2019   Procedure: HOT HEMOSTASIS (ARGON PLASMA COAGULATION/BICAP);  Surgeon: Carol Ada, MD;  Location: [place];  Service: Endoscopy;  Laterality: N/A;  . HOT HEMOSTASIS N/A 05/05/2019   Procedure: HOT HEMOSTASIS (ARGON PLASMA COAGULATION/BICAP);  Surgeon: Ronnette Juniper, MD;  Location: [place];  Service: Gastroenterology;  Laterality: N/A;  . INSERTION OF DIALYSIS CATHETER Right 03/30/2019   Procedure: Ultrasound guided right internal jugular tunneled dialysis catheter placement;  Surgeon: Marty Heck, MD;  Location: [facility] OR;  Service: Vascular;  Laterality: Right;  . kidney stone    . REMOVAL OF A DIALYSIS CATHETER Right 06/26/2019   Procedure: REMOVAL OF A DIALYSIS CATHETER ( PD CATH REMOVAL AND I J CATH REMOVAL;  Surgeon: Katha Cabal, MD;  Location: [facility] ORS;  Service: Vascular;  Laterality: Right;  . THYROIDECTOMY    . VARICOCELE EXCISION    . VIDEO BRONCHOSCOPY Bilateral 05/25/2016   Procedure: VIDEO BRONCHOSCOPY WITHOUT FLUORO;  Surgeon: Juanito Doom, MD;  Location: [facility] ENDOSCOPY;  Service: Cardiopulmonary;  Laterality: Bilateral;   HPI:  Per admitting H&P, pt "is  a 69 y.o. male with medical history significant for CVA with left-sided weakness, end-stage renal disease on hemodialysis (M/W/F), history of seizure disorder, diabetes mellitus, hypertension who was brought into the emergency room by EMS for evaluation of a fall that occurred on the  morning of his admission and concern for seizure per patient. "    CXR (01/25/20): "Streaky bibasilar opacities, favor atelectasis. 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 As such, pt has moderate amounts of vallecular residue with dysphagia 3. Liquid wash was helpful in clearing residue as pt had better response to heavier bolus when dysphagia 3 and liquid were present with liquid wash. Whole pill was administered with just thin liquids and also whole in puree. With each, whole pill was difficult to clear from the vallecula and required multiple boluses of puree to clear. Medicine must be CRUSHED with puree. 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 Recommend pt continue dysphagia 3, thin liquids with medicine crushed in puree. SLP Visit Diagnosis Dysphagia, pharyngeal phase (R13.13)  Attention and concentration deficit following -- Frontal lobe and executive function deficit following CVA-- Impact on safety and function; Mild aspiration risk"    Assessment / Plan / Recommendation Clinical Impression  Pt was seen bedside for clinical swallow evaluation. No Overt Immediate clinical s/s of aspiration were noted. According to conversation w/ NSG, pt continues to have cognitive impact s/p previous CVA. Upon clinician arrival to room, pt was awake & alert in bed-- cooperative throughout. Distractions were reduced as possible. Pt was verbose, appeared min confused/disoriented. Of note, NSG stated pt had a hard time w/ his pills this morning -- seemed to have difficulty taking multiple pills at one time(?). Pt appears inattentive at times to tasks during evaluation -- any Cognitive decline can impact oropharyngeal phase  function of swallowing and increase risk for aspiration to occur.  During cursory Oral Mech Exam, pt appears to have adequate natural dentition. Lingual strength & ROM grossly WFL; labial seal adequate-- no noted anterior spillage/impaired management of secretions noted. Oral care completed (by pt) using oral swab kit, w/ min verbal cues from clinician. Pt required mod+ assist for upright positioning in bed. During PO trials of: ~16x sips of thin liquid via cup, 10x bites puree via spoon, and 8x bites softened solid via spoon, No Overt Immediate Clinical s/s of aspiration were noted. Pt was able to self-feed all trial consistencies w/ set up assist-- exhibiting min UE tremor, but self-feeding abilities grossly functional. Oral phase grossly WFL-- rotary mastication pattern noted [facility]); adequate labial seal. Pt appears to have min decreased awareness of bolus d/t cognitive impact, which impacts Oral Prep Stage-- min impulsive w/ bolus size (esp w/ more solid consistencies), reduced ability to consistently & accurately engage hand-to-mouth motion, & spilling bolus d/t decreased awareness during self-feeding. Oral phase also appears min prolonged/effortful-- increased time for A-P transfer of bolus, & to achieve complete clearance of oral cavity following PO's. Min-mod Verbal cues were given to remind pt to engage safe swallowing strategies of small sips/bites & attend to task of self-feeding, which appeared to improve pt awareness of bolus & engagement w/ bolus. Extra time was given during Oral Phase to achieve clearance of oral residue & masticate bolus; pt given verbal cues to alternate solids &  liquids to achieve complete clearance of residue from oral cavity. Pt also cued to use volitional lingual sweep & finger sweep as needed to clear oral residue PRIOR to taking subsequent PO, which appeared effective. During the pharyngeal phase, pt exhibited strong productive cough during ~3/16x PO trials of thin liquids  via cup. No noted increase in WOB/SOB was noted at the time of these incidents; pt vocal quality remained clear. Pt was instructed to take a break following coughing episodes, and delay subsequent POs until cough had completely subsided. 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 Pills were recommended Crushed in puree per study.  Recommend Dysphagia 3 (Mech Soft) diet w/ thin liquids VIA CUP; meds CRUSHED in puree (see swallow study results 04/02/19 in HPI). Recommend aspiration precautions and intermittent supervision to cue for follow through w/ safe swallowing strategies, including: Small Bites/Sips, NO STRAWS, Slow Pace of Eating/Drinking, Reducing Distractions, Alternating Solids/Liquids, and Lingual/Finger Sweep for Clearance of Oral Residue. Education was given; pt stated understanding of goals & intervention. NSG/MD updated. ST services to f/u toleration of diet 2x/week x 2 weeks.  SLP Visit Diagnosis: Dysphagia, oropharyngeal phase (R13.12)    Aspiration Risk  Mild aspiration risk    Diet Recommendation   Dysphagia 3 diet w/ thin liquids VIA CUP; meds CRUSHED in puree; aspiration precautions and supervision at meals  Medication Administration: Whole meds with puree    Other  Recommendations Oral Care Recommendations: Oral care BID   Follow up Recommendations Home health SLP;Outpatient SLP;Inpatient Rehab;Skilled Nursing facility (PRN-- determined at f/u /dc w PCP)      Frequency and Duration min 2x/week  2 weeks       Prognosis Prognosis for Safe Diet Advancement: Fair Barriers to Reach Goals: Cognitive deficits;Severity of deficits;Behavior      Swallow Study 04/02/2019  General Date of Onset: 01/25/20 HPI: Per admitting H&P, pt "is a 69 y.o. male with medical history significant for CVA  with left-sided weakness, end-stage renal disease on hemodialysis (M/W/F), history of seizure disorder, diabetes mellitus, hypertension who was brought into the emergency room by EMS for evaluation of a fall that occurred on the morning of his admission and concern for seizure per patient. " Type of Study: Bedside Swallow Evaluation Previous Swallow Assessment: 04/16/2019, 03/18/2019 ("dx-- pharyngeal phase dysphagia") Diet Prior to this Study: Thin liquids ("liquid diet"--11/30) Temperature Spikes Noted: Yes Respiratory Status: Room air History of Recent Intubation: No Behavior/Cognition: Alert;Cooperative;Pleasant mood;Confused;Distractible;Requires cueing Oral Cavity Assessment: Within Functional Limits Oral Care Completed by SLP: Other (Comment) (completed by pt w/ slp) Oral Cavity - Dentition: Adequate natural dentition Vision: Functional for self-feeding Self-Feeding Abilities: Able to feed self;Needs set up Patient Positioning: Upright in bed Baseline Vocal Quality: Normal    Oral/Motor/Sensory Function Overall Oral Motor/Sensory Function: Within functional limits   Ice Chips Ice chips: Not tested   Thin Liquid Thin Liquid: Impaired Presentation: Cup;Self Fed Pharyngeal  Phase Impairments: Cough - Immediate;Cough - Delayed Other Comments: 3x/16x cough noted    Nectar Thick Nectar Thick Liquid: Not tested   Honey Thick Honey Thick Liquid: Not tested   Puree Puree: Within functional limits Presentation: Self Fed;Spoon Other Comments: 10x   Solid     Solid: Within functional limits Presentation: Self Fed;Spoon Other Comments: 8x      Quintella Baton  Graduate Clinician 01/27/2020,11:20 AM  The information in this patient note, response to treatment, and overall treatment plan developed has been reviewed and agreed upon after reviewing documentation. This session was performed under the supervision of this licensed clinician.   Orinda Kenner, MS, [place] Speech Language  Pathologist Rehab Services [phone]

## 2020-01-28 DIAGNOSIS — E1142 Type 2 diabetes mellitus with diabetic polyneuropathy: Secondary | ICD-10-CM | POA: Diagnosis not present

## 2020-01-28 DIAGNOSIS — I639 Cerebral infarction, unspecified: Secondary | ICD-10-CM | POA: Diagnosis not present

## 2020-01-28 DIAGNOSIS — F32A Depression, unspecified: Secondary | ICD-10-CM | POA: Diagnosis not present

## 2020-01-28 DIAGNOSIS — N186 End stage renal disease: Secondary | ICD-10-CM | POA: Diagnosis not present

## 2020-01-28 LAB — RESP PANEL BY RT-PCR (FLU A&B, COVID) ARPGX2
Influenza A by PCR: NEGATIVE
Influenza B by PCR: NEGATIVE
SARS Coronavirus 2 by RT PCR: NEGATIVE

## 2020-01-28 LAB — GLUCOSE, CAPILLARY
Glucose-Capillary: 132 mg/dL — ABNORMAL HIGH (ref 70–99)
Glucose-Capillary: 152 mg/dL — ABNORMAL HIGH (ref 70–99)
Glucose-Capillary: 171 mg/dL — ABNORMAL HIGH (ref 70–99)
Glucose-Capillary: 173 mg/dL — ABNORMAL HIGH (ref 70–99)

## 2020-01-28 LAB — HEPATITIS B SURFACE ANTIBODY, QUANTITATIVE: Hep B S AB Quant (Post): 7.8 m[IU]/mL — ABNORMAL LOW (ref >9.9)

## 2020-01-28 MED ORDER — GABAPENTIN 100 MG PO CAPS
100.0000 mg | ORAL_CAPSULE | Freq: Two times a day (BID) | ORAL | Status: DC
Start: 2020-01-28 — End: 2020-03-31

## 2020-01-28 MED ORDER — ONDANSETRON HCL 4 MG PO TABS: 4.0000 mg | ORAL_TABLET | Freq: Every day | ORAL | 0 refills | Status: DC | PRN

## 2020-01-28 MED ORDER — LOPERAMIDE HCL 2 MG PO CAPS
2.0000 mg | ORAL_CAPSULE | ORAL | Status: DC | PRN
Start: 2020-01-28 — End: 2020-03-16

## 2020-01-28 MED ORDER — AMOXICILLIN-POT CLAVULANATE 500-125 MG PO TABS: 1.0000 | ORAL_TABLET | ORAL | 0 refills | Status: DC

## 2020-01-28 MED ORDER — LACOSAMIDE 100 MG PO TABS: 100.0000 mg | ORAL_TABLET | Freq: Two times a day (BID) | ORAL | Status: DC

## 2020-01-28 MED ORDER — MAGNESIUM OXIDE 400 (241.3 MG) MG PO TABS: 400.0000 mg | ORAL_TABLET | Freq: Every day | ORAL | Status: AC

## 2020-01-28 NOTE — TOC Progression Note (Signed)
Transition of Care [facility] - Progression Note    Patient Details  Name: Kyle [name] MRN: [id number] Date of Birth: [birth year]  [facility]) CM/SW Contact  Shelbie Hutching, RN Phone Number: 01/28/2020, 12:28 PM  Clinical Narrative:    Transportation arranged with [facility].  Patient is 5th on the list for pickup.    Expected Discharge Plan: [place] Barriers to Discharge: Barriers Resolved  Expected Discharge Plan and Services Expected Discharge Plan: [place]   Discharge Planning Services: CM Consult Post Acute Care Choice: [place] Living arrangements for the past 2 months: Single Family Home Expected Discharge Date: 01/28/20               DME Arranged: N/A DME Agency: NA       HH Arranged: NA           Social Determinants of Health (SDOH) Interventions    Readmission Risk Interventions Readmission Risk Prevention Plan 01/26/2020  Transportation Screening Complete  Medication Review Press photographer) Complete  PCP or Specialist appointment within 3-5 days of discharge Complete  HRI or Home Care Consult Complete  SW Recovery Care/Counseling Consult Complete  Palliative Care Screening Not [place] Complete  Some recent data might be hidden

## 2020-01-28 NOTE — Plan of Care (Signed)
Report called to [place], Therapist, sports at [facility].

## 2020-01-28 NOTE — Plan of Care (Signed)
Attempted to call report to [facility]. No answer at this time, will attempt again.

## 2020-01-28 NOTE — TOC Transition Note (Signed)
Transition of Care [facility]) - CM/SW Discharge Note   Patient Details  Name: Kyle [name] MRN: [id number] Date of Birth: February 10, 1951  Transition of Care [facility]) CM/SW Contact:  Shelbie Hutching, RN Phone Number: 01/28/2020, 11:14 AM   Clinical Narrative:    Patient is medically cleared for discharge to [facility] today.  Patient made aware of discharge plan is agreeable.  Wife [name] also made aware of discharge to [facility] today.  Patient will go to room 503, bedside RN will call report to [phone].  RNCM will arrange EMS transport once Rapid Covid test results come back.     Final next level of care: Skilled Nursing Facility Barriers to Discharge: Barriers Resolved   Patient Goals and CMS Choice Patient states their goals for this hospitalization and ongoing recovery are:: Patient's wife would like for patient to go for short term rehab CMS Medicare.gov Compare Post Acute Care list provided to:: Patient Represenative (must comment) Choice offered to / list presented to : Spouse  Discharge Placement   Existing PASRR number confirmed : 01/26/20          Patient chooses bed at: [facility] Patient to be transferred to facility by: [facility] Name of family member notified: [name] (wife) Patient and family notified of of transfer: 01/28/20  Discharge Plan and Services   Discharge Planning Services: CM Consult Post Acute Care Choice: [place]          DME Arranged: N/A DME Agency: NA       HH Arranged: NA          Social Determinants of Health (SDOH) Interventions     Readmission Risk Interventions Readmission Risk Prevention Plan 01/26/2020  Transportation Screening Complete  Medication Review Press photographer) Complete  PCP or Specialist appointment within 3-5 days of discharge Complete  HRI or Home Care Consult Complete  SW Recovery Care/Counseling Consult Complete  Palliative Care Screening Not [place] Complete  Some recent data might be hidden

## 2020-01-28 NOTE — Progress Notes (Signed)
[facility]  ROUNDING NOTE   Subjective:   Patient presented to the ED on 01/25/2020 , with a possible seizure and  fall.  He has history of end-stage renal disease and is on hemodialysis Monday Wednesday and Friday.  Patient resting in bed, in no acute distress. Still has wet sounding cough Appears weak Has not had his lunch     Objective:  Vital signs in last 24 hours:  Temp:  [97.6 F (36.4 C)-98.7 F (37.1 C)] 97.8 F (36.6 C) (12/02 1126) Pulse Rate:  [89-100] 98 (12/02 1126) Resp:  [17-20] 18 (12/02 1126) BP: (137-193)/(67-99) 170/84 (12/02 1126) SpO2:  [96 %-100 %] 100 % (12/02 1126)  Weight change:  Filed Weights   01/25/20 0834  Weight: 79.8 kg    Intake/Output: I/O last 3 completed shifts: In: 1159.9 [P.O.:240; I.V.:919.9] Out: 625 [Urine:125; Other:500]   Intake/Output this shift:  No intake/output data recorded.  Physical Exam: General: Resting in bed, in no acute distress  Head:  Normocephalic, atraumatic  Eyes:  Sclerae and conjunctivae clear  Lungs:  l respirations symmetrical, lungs coarse breath sounds b/l  Heart:  Regular  Abdomen:  Soft, nontender, nondistended  Extremities:  No peripheral edema  Neurologic:  Speech clear  Skin: No acute lesions or rashes  Access:  Left upper arm aVF    Basic Metabolic Panel: Recent Labs  Lab 01/25/20 0816 01/26/20 0546 01/26/20 2012 01/27/20 0340  NA 131* 136  --  138  K 6.0* 4.2  --  4.3  CL 91* 96*  --  96*  CO2 24 27  --  27  GLUCOSE 235* 113*  --  111*  BUN 43* 23  --  34*  CREATININE 7.70* 5.04*  --  6.50*  CALCIUM 7.9* 8.1*  --  8.3*  MG 1.4* 1.6* 1.6* 1.6*  PHOS  --   --   --  4.3    Liver Function Tests: Recent Labs  Lab 01/25/20 0816 01/27/20 0340  AST 16 11*  ALT 7 8  ALKPHOS 93 76  BILITOT 1.2 1.3*  PROT 6.8 6.5  ALBUMIN 3.5 3.5   No results for input(s): LIPASE, AMYLASE in the last 168 hours. No results for input(s): AMMONIA in the last 168  hours.  CBC: Recent Labs  Lab 01/25/20 0816 01/26/20 0546 01/27/20 0340  WBC 7.8 5.5 5.5  NEUTROABS 6.2  --   --   HGB 11.7* 10.2* 11.5*  HCT 37.4* 32.0* 35.9*  MCV 99.2 97.6 99.2  PLT 170 147* 158    Cardiac Enzymes: No results for input(s): CKTOTAL, CKMB, CKMBINDEX, TROPONINI in the last 168 hours.  BNP: Invalid input(s): POCBNP  CBG: Recent Labs  Lab 01/27/20 2006 01/28/20 0029 01/28/20 0502 01/28/20 0814 01/28/20 1127  GLUCAP 78 152* 132* 171* 173*    Microbiology: Results for orders placed or performed during the hospital encounter of 01/25/20  Resp Panel by RT-PCR (Flu A&B, Covid) Nasopharyngeal Swab     Status: None   Collection Time: 01/25/20  8:16 AM   Specimen: Nasopharyngeal Swab; Nasopharyngeal(NP) swabs in vial transport medium  Result Value Ref Range Status   SARS Coronavirus 2 by RT PCR NEGATIVE NEGATIVE Final    Comment: (NOTE) SARS-CoV-2 target nucleic acids are NOT DETECTED.  The SARS-CoV-2 RNA is generally detectable in upper respiratory specimens during the acute phase of infection. The lowest concentration of SARS-CoV-2 viral copies this assay can detect is 138 copies/mL. A negative result does not preclude  SARS-Cov-2 infection and should not be used as the sole basis for treatment or other patient management decisions. A negative result may occur with  improper specimen collection/handling, submission of specimen other than nasopharyngeal swab, presence of viral mutation(s) within the areas targeted by this assay, and inadequate number of viral copies(<138 copies/mL). A negative result must be combined with clinical observations, patient history, and epidemiological information. The expected result is Negative.  Fact Sheet for Patients:  EntrepreneurPulse.com.au  Fact Sheet for Healthcare Providers:  IncredibleEmployment.be  This test is no t yet approved or cleared by the [place] FDA and   has been authorized for detection and/or diagnosis of SARS-CoV-2 by FDA under an Emergency Use Authorization (EUA). This EUA will remain  in effect (meaning this test can be used) for the duration of the COVID-19 declaration under Section 564(b)(1) of the Act, 21 U.S.C.section 360bbb-3(b)(1), unless the authorization is terminated  or revoked sooner.       Influenza A by PCR NEGATIVE NEGATIVE Final   Influenza B by PCR NEGATIVE NEGATIVE Final    Comment: (NOTE) The Xpert Xpress SARS-CoV-2/FLU/RSV plus assay is intended as an aid in the diagnosis of influenza from Nasopharyngeal swab specimens and should not be used as a sole basis for treatment. Nasal washings and aspirates are unacceptable for Xpert Xpress SARS-CoV-2/FLU/RSV testing.  Fact Sheet for Patients: EntrepreneurPulse.com.au  Fact Sheet for Healthcare Providers: IncredibleEmployment.be  This test is not yet approved or cleared by the [place] FDA and has been authorized for detection and/or diagnosis of SARS-CoV-2 by FDA under an Emergency Use Authorization (EUA). This EUA will remain in effect (meaning this test can be used) for the duration of the COVID-19 declaration under Section 564(b)(1) of the Act, 21 U.S.C. section 360bbb-3(b)(1), unless the authorization is terminated or revoked.  Performed at [facility], [address] [place], [place] [address]   MRSA PCR Screening     Status: Abnormal   Collection Time: 01/26/20  6:05 PM   Specimen: Nasopharyngeal  Result Value Ref Range Status   MRSA by PCR POSITIVE (A) NEGATIVE Final    Comment:        The GeneXpert MRSA Assay (FDA approved for NASAL specimens only), is one component of a comprehensive MRSA colonization surveillance program. It is not intended to diagnose MRSA infection nor to guide or monitor treatment for MRSA infections. RESULT CALLED TO, READ BACK BY AND VERIFIED WITH: Ilene Qua  1933 01/26/2020 DB Performed at [facility], [address] [place], [place] [address]   C Difficile Quick Screen w PCR reflex     Status: Abnormal   Collection Time: 01/26/20  6:05 PM   Specimen: STOOL  Result Value Ref Range Status   C Diff antigen POSITIVE (A) NEGATIVE Final   C Diff toxin NEGATIVE NEGATIVE Final   C Diff interpretation Results are indeterminate. See PCR results.  Final    Comment: Performed at [facility], [address] [place], [place] [address]  C. Diff by PCR, Reflexed     Status: None   Collection Time: 01/26/20  6:05 PM  Result Value Ref Range Status   Toxigenic C. Difficile by PCR NEGATIVE NEGATIVE Final    Comment: Patient is colonized with non toxigenic C. difficile. May not need treatment unless significant symptoms are present. Performed at [facility], [address] [place], [place] [address]   Resp Panel by RT-PCR (Flu A&B, Covid) Nasopharyngeal Swab     Status:  None   Collection Time: 01/28/20 11:15 AM   Specimen: Nasopharyngeal Swab; Nasopharyngeal(NP) swabs in vial transport medium  Result Value Ref Range Status   SARS Coronavirus 2 by RT PCR NEGATIVE NEGATIVE Final    Comment: (NOTE) SARS-CoV-2 target nucleic acids are NOT DETECTED.  The SARS-CoV-2 RNA is generally detectable in upper respiratory specimens during the acute phase of infection. The lowest concentration of SARS-CoV-2 viral copies this assay can detect is 138 copies/mL. A negative result does not preclude SARS-Cov-2 infection and should not be used as the sole basis for treatment or other patient management decisions. A negative result may occur with  improper specimen collection/handling, submission of specimen other than nasopharyngeal swab, presence of viral mutation(s) within the areas targeted by this assay, and inadequate number of viral copies(<138 copies/mL). A negative result must be combined with clinical observations, patient  history, and epidemiological information. The expected result is Negative.  Fact Sheet for Patients:  EntrepreneurPulse.com.au  Fact Sheet for Healthcare Providers:  IncredibleEmployment.be  This test is no t yet approved or cleared by the [place] FDA and  has been authorized for detection and/or diagnosis of SARS-CoV-2 by FDA under an Emergency Use Authorization (EUA). This EUA will remain  in effect (meaning this test can be used) for the duration of the COVID-19 declaration under Section 564(b)(1) of the Act, 21 U.S.C.section 360bbb-3(b)(1), unless the authorization is terminated  or revoked sooner.       Influenza A by PCR NEGATIVE NEGATIVE Final   Influenza B by PCR NEGATIVE NEGATIVE Final    Comment: (NOTE) The Xpert Xpress SARS-CoV-2/FLU/RSV plus assay is intended as an aid in the diagnosis of influenza from Nasopharyngeal swab specimens and should not be used as a sole basis for treatment. Nasal washings and aspirates are unacceptable for Xpert Xpress SARS-CoV-2/FLU/RSV testing.  Fact Sheet for Patients: EntrepreneurPulse.com.au  Fact Sheet for Healthcare Providers: IncredibleEmployment.be  This test is not yet approved or cleared by the [place] FDA and has been authorized for detection and/or diagnosis of SARS-CoV-2 by FDA under an Emergency Use Authorization (EUA). This EUA will remain in effect (meaning this test can be used) for the duration of the COVID-19 declaration under Section 564(b)(1) of the Act, 21 U.S.C. section 360bbb-3(b)(1), unless the authorization is terminated or revoked.  Performed at [facility], [address] [place], [place] [address]     Coagulation Studies: No results for input(s): LABPROT, INR in the last 72 hours.  Urinalysis: No results for input(s): COLORURINE, LABSPEC, PHURINE, GLUCOSEU, HGBUR, BILIRUBINUR, KETONESUR, PROTEINUR,  UROBILINOGEN, NITRITE, LEUKOCYTESUR in the last 72 hours.  Invalid input(s): APPERANCEUR    Imaging: No results found.   Medications:   . sodium chloride 250 mL (01/26/20 0904)   . amoxicillin-clavulanate  1 tablet Oral Q24H  . atorvastatin  10 mg Oral Daily  . calcium acetate  1,334 mg Oral TID WC  . Chlorhexidine Gluconate Cloth  6 each Topical Q0600  . Chlorhexidine Gluconate Cloth  6 each Topical Q0600  . clopidogrel  75 mg Oral Daily  . escitalopram  10 mg Oral Daily  . gabapentin  100 mg Oral BID  . heparin  5,000 Units Subcutaneous Q8H  . insulin aspart  0-9 Units Subcutaneous TID WC  . lacosamide  100 mg Oral BID  . levothyroxine  200 mcg Oral QAC breakfast  . lidocaine-prilocaine  1 application Topical Q T,Th,Sa-HD  . lipase/protease/amylase  24,000 Units Oral TID WC  . losartan  100  mg Oral Daily  . magnesium oxide  400 mg Oral BID  . multivitamin  1 tablet Oral Daily  . mupirocin ointment  1 application Nasal BID  . nystatin   Topical TID  . ondansetron (ZOFRAN) IV  4 mg Intravenous Once  . sodium chloride flush  3 mL Intravenous Q12H  . traZODone  100 mg Oral QHS   sodium chloride, hydrOXYzine, loperamide, sodium chloride flush  Assessment/ Plan:  Mr. Davis Ambrosini is a 69 y.o.  male with medical problems  including CVA with left-sided weakness, ESRD on HD MWF, seizure disorder, DM, hypertension, presented to the emergency department for a possible seizure at home.  #End-stage renal disease on HD MWF We will continue Monday Wednesday Friday schedule  #Diabetes type 2 with CKD Lab Results  Component Value Date   HGBA1C 8.1 (H) 01/26/2020   Patient is on insulin aspart sliding scale  #Anemia with CKD Lab Results  Component Value Date   HGB 11.5 (L) 01/27/2020  No acute indication for Epogen  #Secondary hyperparathyroidism Lab Results  Component Value Date   PTH 91 (H) 03/13/2019   CALCIUM 8.3 (L) 01/27/2020   CAION 0.93 (L) 04/05/2019    PHOS 4.3 01/27/2020  We will continue monitoring bone mineral metabolism parameters   #Hypertension Slightly above goal, on Losartan    LOS: 3 Henchy Mccauley 12/2/20211:59 PM

## 2020-01-28 NOTE — Discharge Summary (Signed)
Physician Discharge Summary  Kyle [name] XHB:[id number] DOB: 12/09/1950 DOA: 01/25/2020  PCP: Kyle Crouch, MD  Admit date: 01/25/2020 Discharge date: 01/28/2020  Admitted From: Home  Discharge disposition: SNF   Recommendations for Outpatient Follow-Up:   . Check CBC, BMP, magnesium in the next visit . Follow up with your primary care provider at the SNF in 3-5 days.  . Continue to take Vimpat for seizures.  . Follow up with neurology as outpatient is 2-3 weeks for seizures. .  Follow up with ENTPaul [name] in 1-2 weeks for follow up of sinus disease.   Discharge Diagnosis:   Principal Problem:   Seizure ([place]) Active Problems:   ESRD (end stage renal disease) ([place])   Hypothyroidism   Cerebrovascular accident (CVA) ([place])   Diabetic polyneuropathy associated with type 2 diabetes mellitus ([place])   Depression   Discharge Condition: Improved.  Diet recommendation: Low sodium, heart healthy.  Carbohydrate-modified.   Wound care: None.  Code status: Full.   History of Present Illness:   Kyle [name] a 69 y.o.malewith medical history significant forCVA with left-sided weakness, end-stage renal disease on hemodialysis(M/W/F),history of seizure disorder, diabetes mellitus, hypertension was brought into the emergency room by EMS for evaluation of a fall that occurred on the morning of this admission and concern for seizure per patient.   In the emergency department patient was thought to have seizure-like activity.  He was given Keppra load and remained postictal..  His wife stated that he had an EEG done recently by his neurologist and that his Keppra was discontinued because the EEG did not show any evidence of seizures.  Laboratory data showed mild hyponatremia and mild hyper kalemia with elevated blood glucose level.  Creatinine elevated at 7.7.  Magnesium of 1.4. CT scan of the head without contrast shows no acute intracranial abnormalities.  Significant left-sided sinus disease, which has progressed since the prior CT. Left maxillary sinus is completely opacified and there is a new defect along the lower lateral wall.   ENT was consulted from the ED for this finding.  Cervical spine CT shows no fracture or acute skeletal abnormality. Small right pleural effusion.  Chest x-ray showed basilar opacities.   In the ED, patient received calcium gluconate and insulin IV and was admitted to hospital.   Hospital Course:   Following conditions were addressed during hospitalization as listed below,  Seizure with postictal state Patient with a history of seizures on Keppra which was recently discontinued after a"normal EEG".  Had witnessed seizure in the ED and Keppra load was given.  Neurology was consulted and subsequently has been discontinued has been discontinued and patient has been started on Vimpat.  No further seizures reported.    Stable at this time.  Will need outpatient neurology follow-up.  Left maxillary sinus disease.  Seen by ENT by Margaretha Sheffield for CT scan finding of maxillary sinus disease..  Was advised Augmentin.  Prescribed renal dose Augmentin on discharge to complete total of 7-day course.   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  Will need to see ENT as outpatient in 1 to 2 weeks, might need surgical intervention.  Nausea and vomiting concern for possible dysphagia.  Speech and swallow has been consulted.    Recommend dysphagia 3 diet.  Chronic diarrhea. Not new. On pancreatic supplements. Seen by GI in the past and was supposed to be on creon but was not taking it.  We will continue on discharge.  Add  loperamide as needed.  C. difficile was negative in the hospital.  Hyperkalemia On presentation.  Received temporizing measures.  Received hemodialysis.    Improved  History of CVA with left-sided weakness Continue Plavix and statins.  Seen by physical therapy who recommended a PT on discharge to  nursing facility.  Diabetes mellitus with end-stage renal disease.    Continue insulin regimen on discharge  ESRD on hemodialysis.  Resume after discharge  Depression On trazodone and Lexapro at home.  Continue on discharge  Hypothyroidism Continue Synthroid  Hypomagnesemia Mildly low.    Continue for the next 10 days.  Disposition.  At this time, patient is stable for disposition to SNF. Spoke with wife on the phone about disposition.  Medical Consultants:    Nephrology  neurolgy  Procedures:    Hemodialysis' EEG Subjective:   Today, patient was seen and examined at bedside.  Poor historian.  Nursing staff at bedside as well.  No vomiting.  He does have diarrhea which is chronic.  Any pain, nausea vomiting no further seizures with  Discharge Exam:   Vitals:   01/28/20 0654 01/28/20 0753  BP: (!) 151/75 (!) 168/84  Pulse: 91 95  Resp:  17  Temp:  97.8 F (36.6 C)  SpO2:  98%   Vitals:   01/28/20 0004 01/28/20 0501 01/28/20 0654 01/28/20 0753  BP: 137/81 (!) 187/82 (!) 151/75 (!) 168/84  Pulse: 99 96 91 95  Resp: 20 20  17   Temp: 98.6 F (37 C) 97.7 F (36.5 C)  97.8 F (36.6 C)  TempSrc: Oral Oral    SpO2: 96% 96%  98%  Weight:      Height:       General: Alert awake, not in obvious distress HENT: pupils equally reacting to light,  No scleral pallor or icterus noted. Oral mucosa is moist.  Chest:  Clear breath sounds.  Diminished breath sounds bilaterally. No crackles or wheezes.  CVS: S1 &S2 heard. No murmur.  Regular rate and rhythm. Abdomen: Soft, nontender, nondistended.  Bowel sounds are heard.   Extremities: No cyanosis, clubbing or edema.  Peripheral pulses are palpable. Psych: Alert, awake and communicative, normal mood, CNS:  No cranial nerve deficits.  Moving all extremities Skin: Warm and dry.  No rashes noted.  The results of significant diagnostics from this hospitalization (including imaging, microbiology, ancillary and  laboratory) are listed below for reference.     Diagnostic Studies:   EEG  Result Date: 01/25/2020 Lora Havens, MD     01/25/2020  4:38 PM Patient Name: Kyle [name] MRN: [id number] Epilepsy Attending: Lora Havens Referring Physician/Provider: Dr Kerney Elbe Date: 01/25/2020 Duration: 27.53 mins Patient history: 69 year old male presenting with seizure recurrence after recent discontinuation of anticonvulsant. EEG to evaluate for seizure Level of alertness: Awake AEDs during EEG study: GBP, LCM Technical aspects: This EEG study was done with scalp electrodes positioned according to the 10-20 International system of electrode placement. Electrical activity was acquired at a sampling rate of 500Hz  and reviewed with a high frequency filter of 70Hz  and a low frequency filter of 1Hz . EEG data were recorded continuously and digitally stored. Description: No posterior dominant rhythm was seen. EEG showed continuous generalized and maximal right temporal region 5 to 6 Hz theta slowing as well as 2-3hz  delta slowing in right temporal region. Physiologic photic driving was not seen during photic stimulation.  Hyperventilation was not performed.   ABNORMALITY -Continuous slow, generalized and maximal right temporal  region IMPRESSION: This study is suggestive of cortical dysfunction in right temporal region which could be secondary to underlying structural abnormality, post-ictal state. There is also moderate diffuse encephalopathy, nonspecific etiology. No seizures or epileptiform discharges were seen throughout the recording. Lora Havens   DG Chest 1 View  Result Date: 01/25/2020 CLINICAL DATA:  Fall/syncope. EXAM: CHEST  1 VIEW COMPARISON:  May 02, 2019. FINDINGS: Similar mildly enlarged cardiac silhouette. Aortic atherosclerosis. No consolidation. Streaky bibasilar opacities. No visible pleural effusions or pneumothorax. Multiple healing right rib fractures. Remote left rib fracture.  Right thoracic inlet clips. IMPRESSION: 1. Streaky bibasilar opacities, favor atelectasis. Aspiration or pneumonia is not excluded. 2. Healing right rib fractures. Electronically Signed   By: Margaretha Sheffield MD   On: 01/25/2020 09:14   DG Elbow 2 Views Left  Result Date: 01/25/2020 CLINICAL DATA:  [place] this morning.  Bilateral elbow pain. EXAM: LEFT ELBOW - 2 VIEW COMPARISON:  None. FINDINGS: No fracture or bone lesion. Elbow joint is normally spaced and aligned.  No joint effusion. There are anterior surgical vascular clips. IMPRESSION: No fracture or elbow joint abnormality Electronically Signed   By: Lajean Manes M.D.   On: 01/25/2020 09:09   DG Elbow 2 Views Right  Result Date: 01/25/2020 CLINICAL DATA:  Dizziness for 4 days. Fell this morning. Bilateral elbow pain. EXAM: RIGHT ELBOW - 2 VIEW COMPARISON:  None. FINDINGS: There is no evidence of fracture, dislocation, or joint effusion. There is no evidence of arthropathy or other focal bone abnormality. Soft tissues are unremarkable. IMPRESSION: Negative. Electronically Signed   By: Lajean Manes M.D.   On: 01/25/2020 09:10   CT Head Wo Contrast  Result Date: 01/25/2020 CLINICAL DATA:  Fall this morning.  Dizziness for 4 days. EXAM: CT HEAD WITHOUT CONTRAST CT CERVICAL SPINE WITHOUT CONTRAST TECHNIQUE: Multidetector CT imaging of the head and cervical spine was performed following the standard protocol without intravenous contrast. Multiplanar CT image reconstructions of the cervical spine were also generated. COMPARISON:  04/07/2019 FINDINGS: CT HEAD FINDINGS Brain: No evidence of acute infarction, hemorrhage, hydrocephalus, extra-axial collection or mass lesion/mass effect. There is ventricular and sulcal enlargement reflecting mild diffuse atrophy. Small old white matter lacunar infarct noted in the left frontal lobe. Small lacunar infarct noted in the left central pons. Bilateral periventricular and subcortical white matter hypoattenuation  is also present consistent mild to moderate chronic microvascular ischemic change. Vascular: No hyperdense vessel or unexpected calcification. Skull: Normal. Negative for fracture or focal lesion. Sinuses/Orbits: Left maxillary sinus is opacified. There is a defect along the lower lateral wall of the sinus with bone density material within the sinus cavities near its floor. Heterogeneous soft tissue attenuation bulges from the medial aspect of the sinus into the nasal cavity. There is complete opacification of the left frontal sinus and the anterior and several middle left ethmoid air cells. The defect along the lateral left maxillary sinus wall is new since the prior study. Opacification of the left frontal sinus and ethmoid air cells is also new. Globes and orbits are unremarkable. Other: None. CT CERVICAL SPINE FINDINGS Alignment: Normal. Skull base and vertebrae: No acute fracture. No primary bone lesion or focal pathologic process. Soft tissues and spinal canal: No prevertebral fluid or swelling. No visible canal hematoma. Disc levels: Moderate loss of disc height at C6-C7 with spondylotic disc bulging. Remaining discs relatively well preserved in height. No convincing disc herniation. There are facet degenerative changes greatest on the right from C2-C3 through C5-C6.  Upper chest: Small right pleural effusion. Other: None. IMPRESSION: HEAD CT 1. No acute intracranial abnormalities. 2. Significant left-sided sinus disease, which has progressed since the prior CT. Left maxillary sinus is completely opacified and there is a new defect along the lower lateral wall. Active infection, including fungal infection, should be considered. There is also new complete opacification of the left frontal and anterior left ethmoid air cells, consistent with occlusion of the left ostiomeatal complex. CERVICAL CT 1. No fracture or acute skeletal abnormality. 2. Small right pleural effusion. Electronically Signed   By: Lajean Manes M.D.   On: 01/25/2020 09:00   CT Cervical Spine Wo Contrast  Result Date: 01/25/2020 CLINICAL DATA:  Fall this morning.  Dizziness for 4 days. EXAM: CT HEAD WITHOUT CONTRAST CT CERVICAL SPINE WITHOUT CONTRAST TECHNIQUE: Multidetector CT imaging of the head and cervical spine was performed following the standard protocol without intravenous contrast. Multiplanar CT image reconstructions of the cervical spine were also generated. COMPARISON:  04/07/2019 FINDINGS: CT HEAD FINDINGS Brain: No evidence of acute infarction, hemorrhage, hydrocephalus, extra-axial collection or mass lesion/mass effect. There is ventricular and sulcal enlargement reflecting mild diffuse atrophy. Small old white matter lacunar infarct noted in the left frontal lobe. Small lacunar infarct noted in the left central pons. Bilateral periventricular and subcortical white matter hypoattenuation is also present consistent mild to moderate chronic microvascular ischemic change. Vascular: No hyperdense vessel or unexpected calcification. Skull: Normal. Negative for fracture or focal lesion. Sinuses/Orbits: Left maxillary sinus is opacified. There is a defect along the lower lateral wall of the sinus with bone density material within the sinus cavities near its floor. Heterogeneous soft tissue attenuation bulges from the medial aspect of the sinus into the nasal cavity. There is complete opacification of the left frontal sinus and the anterior and several middle left ethmoid air cells. The defect along the lateral left maxillary sinus wall is new since the prior study. Opacification of the left frontal sinus and ethmoid air cells is also new. Globes and orbits are unremarkable. Other: None. CT CERVICAL SPINE FINDINGS Alignment: Normal. Skull base and vertebrae: No acute fracture. No primary bone lesion or focal pathologic process. Soft tissues and spinal canal: No prevertebral fluid or swelling. No visible canal hematoma. Disc levels:  Moderate loss of disc height at C6-C7 with spondylotic disc bulging. Remaining discs relatively well preserved in height. No convincing disc herniation. There are facet degenerative changes greatest on the right from C2-C3 through C5-C6. Upper chest: Small right pleural effusion. Other: None. IMPRESSION: HEAD CT 1. No acute intracranial abnormalities. 2. Significant left-sided sinus disease, which has progressed since the prior CT. Left maxillary sinus is completely opacified and there is a new defect along the lower lateral wall. Active infection, including fungal infection, should be considered. There is also new complete opacification of the left frontal and anterior left ethmoid air cells, consistent with occlusion of the left ostiomeatal complex. CERVICAL CT 1. No fracture or acute skeletal abnormality. 2. Small right pleural effusion. Electronically Signed   By: Lajean Manes M.D.   On: 01/25/2020 09:00     Labs:   Basic Metabolic Panel: Recent Labs  Lab 01/25/20 0816 01/25/20 0816 01/26/20 0546 01/26/20 2012 01/27/20 0340  NA 131*  --  136  --  138  K 6.0*   < > 4.2  --  4.3  CL 91*  --  96*  --  96*  CO2 24  --  27  --  27  GLUCOSE 235*  --  113*  --  111*  BUN 43*  --  23  --  34*  CREATININE 7.70*  --  5.04*  --  6.50*  CALCIUM 7.9*  --  8.1*  --  8.3*  MG 1.4*  --  1.6* 1.6* 1.6*  PHOS  --   --   --   --  4.3   < > = values in this interval not displayed.   GFR Estimated Creatinine Clearance: 11.4 mL/min (A) (by C-G formula based on SCr of 6.5 mg/dL (H)). Liver Function Tests: Recent Labs  Lab 01/25/20 0816 01/27/20 0340  AST 16 11*  ALT 7 8  ALKPHOS 93 76  BILITOT 1.2 1.3*  PROT 6.8 6.5  ALBUMIN 3.5 3.5   No results for input(s): LIPASE, AMYLASE in the last 168 hours. No results for input(s): AMMONIA in the last 168 hours. Coagulation profile No results for input(s): INR, PROTIME in the last 168 hours.  CBC: Recent Labs  Lab 01/25/20 0816 01/26/20 0546  01/27/20 0340  WBC 7.8 5.5 5.5  NEUTROABS 6.2  --   --   HGB 11.7* 10.2* 11.5*  HCT 37.4* 32.0* 35.9*  MCV 99.2 97.6 99.2  PLT 170 147* 158   Cardiac Enzymes: No results for input(s): CKTOTAL, CKMB, CKMBINDEX, TROPONINI in the last 168 hours. BNP: Invalid input(s): POCBNP CBG: Recent Labs  Lab 01/27/20 1159 01/27/20 2006 01/28/20 0029 01/28/20 0502 01/28/20 0814  GLUCAP 192* 78 152* 132* 171*   D-Dimer No results for input(s): DDIMER in the last 72 hours. Hgb A1c Recent Labs    01/26/20 0546  HGBA1C 8.1*   Lipid Profile No results for input(s): CHOL, HDL, LDLCALC, TRIG, CHOLHDL, LDLDIRECT in the last 72 hours. Thyroid function studies No results for input(s): TSH, T4TOTAL, T3FREE, THYROIDAB in the last 72 hours.  Invalid input(s): FREET3 Anemia work up No results for input(s): VITAMINB12, FOLATE, FERRITIN, TIBC, IRON, RETICCTPCT in the last 72 hours. Microbiology Recent Results (from the past 240 hour(s))  Resp Panel by RT-PCR (Flu A&B, Covid) Nasopharyngeal Swab     Status: None   Collection Time: 01/25/20  8:16 AM   Specimen: Nasopharyngeal Swab; Nasopharyngeal(NP) swabs in vial transport medium  Result Value Ref Range Status   SARS Coronavirus 2 by RT PCR NEGATIVE NEGATIVE Final    Comment: (NOTE) SARS-CoV-2 target nucleic acids are NOT DETECTED.  The SARS-CoV-2 RNA is generally detectable in upper respiratory specimens during the acute phase of infection. The lowest concentration of SARS-CoV-2 viral copies this assay can detect is 138 copies/mL. A negative result does not preclude SARS-Cov-2 infection and should not be used as the sole basis for treatment or other patient management decisions. A negative result may occur with  improper specimen collection/handling, submission of specimen other than nasopharyngeal swab, presence of viral mutation(s) within the areas targeted by this assay, and inadequate number of viral copies(<138 copies/mL). A negative  result must be combined with clinical observations, patient history, and epidemiological information. The expected result is Negative.  Fact Sheet for Patients:  EntrepreneurPulse.com.au  Fact Sheet for Healthcare Providers:  IncredibleEmployment.be  This test is no t yet approved or cleared by the [place] FDA and  has been authorized for detection and/or diagnosis of SARS-CoV-2 by FDA under an Emergency Use Authorization (EUA). This EUA will remain  in effect (meaning this test can be used) for the duration of the COVID-19 declaration under Section 564(b)(1) of the Act, 21 U.S.C.section 360bbb-3(b)(1),  unless the authorization is terminated  or revoked sooner.       Influenza A by PCR NEGATIVE NEGATIVE Final   Influenza B by PCR NEGATIVE NEGATIVE Final    Comment: (NOTE) The Xpert Xpress SARS-CoV-2/FLU/RSV plus assay is intended as an aid in the diagnosis of influenza from Nasopharyngeal swab specimens and should not be used as a sole basis for treatment. Nasal washings and aspirates are unacceptable for Xpert Xpress SARS-CoV-2/FLU/RSV testing.  Fact Sheet for Patients: EntrepreneurPulse.com.au  Fact Sheet for Healthcare Providers: IncredibleEmployment.be  This test is not yet approved or cleared by the [place] FDA and has been authorized for detection and/or diagnosis of SARS-CoV-2 by FDA under an Emergency Use Authorization (EUA). This EUA will remain in effect (meaning this test can be used) for the duration of the COVID-19 declaration under Section 564(b)(1) of the Act, 21 U.S.C. section 360bbb-3(b)(1), unless the authorization is terminated or revoked.  Performed at [facility], [address] [place], [place] [address]   MRSA PCR Screening     Status: Abnormal   Collection Time: 01/26/20  6:05 PM   Specimen: Nasopharyngeal  Result Value Ref Range Status   MRSA by PCR  POSITIVE (A) NEGATIVE Final    Comment:        The GeneXpert MRSA Assay (FDA approved for NASAL specimens only), is one component of a comprehensive MRSA colonization surveillance program. It is not intended to diagnose MRSA infection nor to guide or monitor treatment for MRSA infections. RESULT CALLED TO, READ BACK BY AND VERIFIED WITH: Ilene Qua 1931 01/26/2020 DB Performed at [facility], [address] [place], [place] [address]   C Difficile Quick Screen w PCR reflex     Status: Abnormal   Collection Time: 01/26/20  6:05 PM   Specimen: STOOL  Result Value Ref Range Status   C Diff antigen POSITIVE (A) NEGATIVE Final   C Diff toxin NEGATIVE NEGATIVE Final   C Diff interpretation Results are indeterminate. See PCR results.  Final    Comment: Performed at [facility], [address] [place], [place] [address]  C. Diff by PCR, Reflexed     Status: None   Collection Time: 01/26/20  6:05 PM  Result Value Ref Range Status   Toxigenic C. Difficile by PCR NEGATIVE NEGATIVE Final    Comment: Patient is colonized with non toxigenic C. difficile. May not need treatment unless significant symptoms are present. Performed at [facility], [address] [place],  [address]      Discharge Instructions:   Discharge Instructions    Diet - low sodium heart healthy   Complete by: As directed    Dysphagia III diet   Discharge instructions   Complete by: As directed    Follow up with your primary care provider at the SNF in 3-5 days. Continue to take Vimpat for seizures. Follow up with neurology as outpatient is 2-3 weeks. Follow up with ENT in 1-2 weeks for follow up of sinus disease   Increase activity slowly   Complete by: As directed      Allergies as of 01/28/2020   No Known Allergies     Medication List    STOP taking these medications   levETIRAcetam 500 MG tablet Commonly known as: KEPPRA   metoprolol succinate 50 MG 24 hr  tablet Commonly known as: TOPROL-XL   polyethylene glycol powder 17 GM/SCOOP powder Commonly known as: GLYCOLAX/MIRALAX     TAKE these medications  amoxicillin-clavulanate 500-125 MG tablet Commonly known as: AUGMENTIN Take 1 tablet (500 mg total) by mouth daily.   atorvastatin 10 MG tablet Commonly known as: LIPITOR Take 10 mg by mouth daily.   calcium acetate 667 MG capsule Commonly known as: PHOSLO Take 2 capsules (1,334 mg total) by mouth 3 (three) times daily with meals. What changed: when to take this   clopidogrel 75 MG tablet Commonly known as: PLAVIX Take 75 mg by mouth daily.   escitalopram 10 MG tablet Commonly known as: LEXAPRO Take 10 mg by mouth daily.   gabapentin 100 MG capsule Commonly known as: NEURONTIN Take 1 capsule (100 mg total) by mouth 2 (two) times daily. What changed: how much to take   hydrOXYzine 25 MG tablet Commonly known as: ATARAX/VISTARIL Take 25 mg by mouth every 6 (six) hours as needed for itching.   insulin glargine 100 UNIT/ML injection Commonly known as: LANTUS Inject 15 Units into the skin at bedtime.   Lacosamide 100 MG Tabs Take 1 tablet (100 mg total) by mouth 2 (two) times daily.   levothyroxine 200 MCG tablet Commonly known as: SYNTHROID Take 200 mcg by mouth daily before breakfast.   lidocaine-prilocaine cream Commonly known as: EMLA Apply 1 application topically Every Tuesday,Thursday,and Saturday with dialysis.   loperamide 2 MG capsule Commonly known as: IMODIUM Take 1 capsule (2 mg total) by mouth as needed for diarrhea or loose stools.   losartan 100 MG tablet Commonly known as: COZAAR Take 100 mg by mouth daily.   magnesium oxide 400 (241.3 Mg) MG tablet Commonly known as: MAG-OX Take 1 tablet (400 mg total) by mouth daily for 10 days.   NEPHRO-VITE PO Take 1 tablet by mouth daily.   ondansetron 4 MG tablet Commonly known as: Zofran Take 1 tablet (4 mg total) by mouth daily as needed for nausea  or vomiting.   Pancrelipase (Lip-Prot-Amyl) 24000-76000 units Cpep Take 1 capsule by mouth 3 (three) times daily with meals.   pantoprazole 40 MG tablet Commonly known as: PROTONIX Take 1 tablet (40 mg total) by mouth 2 (two) times daily.   potassium chloride SA 20 MEQ tablet Commonly known as: KLOR-CON Take 20 mEq by mouth 2 (two) times daily.   traZODone 100 MG tablet Commonly known as: DESYREL Take 100 mg by mouth at bedtime.       Contact information for follow-up providers    Margaretha Sheffield, MD. Schedule an appointment as soon as possible for a visit in 1 week(s).   Specialty: Otolaryngology Why: fungal ball and sinus disease followup Contact information: [place]. [place] [address] [phone]            Contact information for after-discharge care    [facility] .   Service: Skilled Nursing Contact information: [address] [phone]                   Time coordinating discharge: 39 minutes  Signed:  Lakisha Peyser  [facility] 01/28/2020, 9:31 AM

## 2020-02-01 NOTE — ED Provider Notes (Signed)
.  Critical Care Performed by: Lucrezia Starch, MD Authorized by: Lucrezia Starch, MD   Critical care provider statement:    Critical care time (minutes):  45   Critical care was necessary to treat or prevent imminent or life-threatening deterioration of the following conditions:  Metabolic crisis and dehydration   Critical care was time spent personally by me on the following activities:  Discussions with consultants, evaluation of patient's response to treatment, examination of patient, ordering and performing treatments and interventions, ordering and review of laboratory studies, ordering and review of radiographic studies, pulse oximetry, re-evaluation of patient's condition, obtaining history from patient or surrogate and review of old charts     Lucrezia Starch, MD 02/01/20 1044

## 2020-02-17 ENCOUNTER — Emergency Department
Admission: EM | Admit: 2020-02-17 | Discharge: 2020-02-17 | Disposition: A | Discharge status: Home / Self Care | Payer: Medicare PPO | Attending: Emergency Medicine | Admitting: Emergency Medicine

## 2020-02-17 ENCOUNTER — Other Ambulatory Visit: Payer: Self-pay

## 2020-02-17 DIAGNOSIS — R569 Unspecified convulsions: Secondary | ICD-10-CM | POA: Diagnosis present

## 2020-02-17 DIAGNOSIS — E039 Hypothyroidism, unspecified: Secondary | ICD-10-CM | POA: Insufficient documentation

## 2020-02-17 DIAGNOSIS — E1122 Type 2 diabetes mellitus with diabetic chronic kidney disease: Secondary | ICD-10-CM | POA: Insufficient documentation

## 2020-02-17 DIAGNOSIS — J449 Chronic obstructive pulmonary disease, unspecified: Secondary | ICD-10-CM | POA: Diagnosis not present

## 2020-02-17 DIAGNOSIS — Z794 Long term (current) use of insulin: Secondary | ICD-10-CM | POA: Diagnosis not present

## 2020-02-17 DIAGNOSIS — Z8673 Personal history of transient ischemic attack (TIA), and cerebral infarction without residual deficits: Secondary | ICD-10-CM | POA: Insufficient documentation

## 2020-02-17 DIAGNOSIS — Z7902 Long term (current) use of antithrombotics/antiplatelets: Secondary | ICD-10-CM | POA: Diagnosis not present

## 2020-02-17 DIAGNOSIS — Z992 Dependence on renal dialysis: Secondary | ICD-10-CM | POA: Insufficient documentation

## 2020-02-17 DIAGNOSIS — N186 End stage renal disease: Secondary | ICD-10-CM | POA: Diagnosis not present

## 2020-02-17 DIAGNOSIS — E1142 Type 2 diabetes mellitus with diabetic polyneuropathy: Secondary | ICD-10-CM | POA: Insufficient documentation

## 2020-02-17 DIAGNOSIS — I120 Hypertensive chronic kidney disease with stage 5 chronic kidney disease or end stage renal disease: Secondary | ICD-10-CM | POA: Diagnosis not present

## 2020-02-17 DIAGNOSIS — F17210 Nicotine dependence, cigarettes, uncomplicated: Secondary | ICD-10-CM | POA: Diagnosis not present

## 2020-02-17 DIAGNOSIS — Z79899 Other long term (current) drug therapy: Secondary | ICD-10-CM | POA: Diagnosis not present

## 2020-02-17 LAB — CBC
HCT: 34.9 % — ABNORMAL LOW (ref 39.0–52.0)
Hemoglobin: 11.1 g/dL — ABNORMAL LOW (ref 13.0–17.0)
MCH: 31.6 pg (ref 26.0–34.0)
MCHC: 31.8 g/dL (ref 30.0–36.0)
MCV: 99.4 fL (ref 80.0–100.0)
Platelets: 180 10*3/uL (ref 150–400)
RBC: 3.51 MIL/uL — ABNORMAL LOW (ref 4.22–5.81)
RDW: 15.3 % (ref 11.5–15.5)
WBC: 5.1 10*3/uL (ref 4.0–10.5)
nRBC: 0 % (ref 0.0–0.2)

## 2020-02-17 LAB — BASIC METABOLIC PANEL
Anion gap: 14 (ref 5–15)
BUN: 16 mg/dL (ref 8–23)
CO2: 27 mmol/L (ref 22–32)
Calcium: 8.9 mg/dL (ref 8.9–10.3)
Chloride: 95 mmol/L — ABNORMAL LOW (ref 98–111)
Creatinine, Ser: 3.8 mg/dL — ABNORMAL HIGH (ref 0.61–1.24)
GFR, Estimated: 16 mL/min — ABNORMAL LOW (ref >60)
Glucose, Bld: 77 mg/dL (ref 70–99)
Potassium: 4.3 mmol/L (ref 3.5–5.1)
Sodium: 136 mmol/L (ref 135–145)

## 2020-02-17 MED ORDER — DM-GUAIFENESIN ER 30-600 MG PO TB12
1.0000 | ORAL_TABLET | Freq: Once | ORAL | Status: AC
  Administered 2020-02-17: 1 via ORAL

## 2020-02-17 MED ORDER — ACETAMINOPHEN 325 MG PO TABS: 650.0000 mg | ORAL_TABLET | Freq: Once | ORAL | Status: AC

## 2020-02-17 MED ORDER — SODIUM CHLORIDE 0.9 % IV SOLN
150.0000 mg | Freq: Once | INTRAVENOUS | Status: AC
  Administered 2020-02-17: 150 mg via INTRAVENOUS
  Filled 2020-02-17: qty 15

## 2020-02-17 MED ORDER — ACETAMINOPHEN 325 MG PO TABS
ORAL_TABLET | ORAL | Status: AC
  Administered 2020-02-17: 650 mg via ORAL
  Filled 2020-02-17: qty 2

## 2020-02-17 MED ORDER — VIMPAT 150 MG PO TABS
150.0000 mg | ORAL_TABLET | Freq: Two times a day (BID) | ORAL | 1 refills | Status: DC
Start: 2020-02-17 — End: 2020-03-16

## 2020-02-17 NOTE — Discharge Instructions (Signed)
Please start taking Vimpat 150 mg BID. This will replace your current Vimpat 100 mg BID prescription. Please follow up with Dr. Melrose Nakayama. Please seek medical attention for any high fevers, chest pain, shortness of breath, change in behavior, persistent vomiting, bloody stool or any other new or concerning symptoms.

## 2020-02-17 NOTE — ED Notes (Signed)
Pt given sandwich tray at this time with lays chips and apple juice. Pt comfortable in bed, sitting up for meal. Pt fed himself, no needs noted. Pt given chocolate ice cream post meal. Pt tolerated eating well, requesting cough medication at this time.

## 2020-02-17 NOTE — ED Provider Notes (Signed)
[facility] Emergency Department Provider Note   ____________________________________________   I have reviewed the triage vital signs and the nursing notes.   HISTORY  Chief Complaint Seizures   History limited by: Confusion, some history obtained from wife at bedside   HPI Kyle [name] is a 69 y.o. male who presents to the emergency department today from dialysis because of concern for a seizure. The patient has known history of epilepsy.  Is followed by Dr. Melrose Nakayama, neurology.  Recently had his medication switched from Sunray to Vimpat.  The patient states he is somewhat confused and does not quite remember what was happening before his seizure.  The patient per the wife typically has absence type seizures however description from witnesses at the scene told her that he was having full body jerking.  The patient denies any recent illness.  Denies any recent fevers.  Records reviewed. Per medical record review patient has a history of seizures, followed by Dr. Melrose Nakayama. Patient recently had EEG done through Dr. Lannie Fields office. At that time no definitive epileptic activity and described as equivocal.  Past Medical History:  Diagnosis Date  . Anemia   . Anxiety   . BPH (benign prostatic hyperplasia)   . Chronic kidney disease   . Colon polyps   . COPD (chronic obstructive pulmonary disease) ([place])   . Depression   . Diabetes mellitus without complication ([place])   . Diverticulosis of colon   . History of kidney stones   . Hyperlipidemia   . Hypertension   . Hypothyroidism   . Nephrolithiasis   . Seizures ([place])    2015 last seizure  . Stroke [facility])     Patient Active Problem List   Diagnosis Date Noted  . Seizure ([place]) 01/25/2020  . Depression   . Acute GI bleeding 07/28/2019  . Melena 07/22/2019  . History of GI bleed 07/22/2019  . Current every day smoker 06/22/2019  . Falls frequently 06/22/2019  . Palliative care by specialist   . Goals  of care, counseling/discussion   . Orthostasis 05/02/2019  . ESRD (end stage renal disease) on dialysis ([place])   . Acute blood loss anemia 04/30/2019  . Acute on chronic blood loss anemia 04/29/2019  . History of CVA (cerebrovascular accident) 04/29/2019  . GI bleed 04/04/2019  . 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[place]) 03/20/2019  . Generalized weakness   . Diabetic polyneuropathy associated with type 2 diabetes mellitus ([place])   . Cerebrovascular accident (CVA) ([place])   . PAD (peripheral artery disease) ([place]) 03/17/2019  . Ptosis of eyelid, left 03/15/2019  . Physical deconditioning 03/14/2019  . Hypomagnesemia 03/13/2019  . Hyperphosphatemia 03/13/2019  . ESRD (end stage renal disease) ([place]) 03/13/2019  . Chronic kidney disease with peritoneal dialysis as preferred modality, stage 5 ([place]) 03/13/2019  . Prolonged QT interval 03/13/2019  . Hypertension associated with diabetes ([place]) 03/13/2019  . Hypothyroidism 03/13/2019  . Chronic diarrhea 03/13/2019  . Hypocalcemia 03/12/2019  . Ataxia 03/10/2019  . Dizziness 03/10/2019  . Vision problem 03/10/2019  . Hypokalemia 09/16/2018  . ESRD on peritoneal dialysis ([place]) 03/04/2018  . Lung nodule 06/12/2016  . Narrowing of airway   . Cigarette smoker 05/24/2016  . Collapse of right lung 05/24/2016  . COPD with chronic bronchitis ([place]) 05/24/2016  . Diarrhea due to malabsorption 07/01/2015  . Hx of adenomatous colonic polyps 07/01/2015  . Acne 05/20/2015  . Calculus of kidney 05/20/2015  . Chest pain, non-cardiac 05/20/2015  . Seizure disorder ([place]) 05/20/2015  .  Current tobacco use 05/20/2015  . Chronic kidney disease (CKD), stage III (moderate) ([place]) 03/29/2015  . Insulin dependent type 2 diabetes mellitus ([place]) 03/15/2015  . Lipoma of shoulder 03/08/2014  . Other synovitis and tenosynovitis, right shoulder 03/08/2014  . Right supraspinatus tenosynovitis 03/08/2014  . Bursitis of elbow 02/15/2014  . Olecranon bursitis of right elbow 02/15/2014   . Absolute anemia 08/08/2013  . 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 Back pain, chronic 08/08/2013  . Chronic obstructive pulmonary disease ([place]) 08/08/2013  . BP (high blood pressure) 08/08/2013  . Hyperlipidemia associated with type 2 diabetes mellitus ([place]) 08/08/2013  . Acne erythematosa 08/08/2013    Past Surgical History:  Procedure Laterality Date  . AV FISTULA PLACEMENT Left 03/30/2019   Procedure: right brachiocephalic fistula creation;  Surgeon: Marty Heck, MD;  Location: [place];  Service: Vascular;  Laterality: Left;  . BIOPSY  04/05/2019   Procedure: BIOPSY;  Surgeon: Wilford Corner, MD;  Location: [place];  Service: Endoscopy;;  . COLONOSCOPY    . COLONOSCOPY WITH PROPOFOL N/A 05/30/2015   Procedure: COLONOSCOPY WITH PROPOFOL;  Surgeon: Manya Silvas, MD;  Location[facility] ENDOSCOPY;  Service: Endoscopy;  Laterality: N/A;  . COLONOSCOPY WITH PROPOFOL N/A 12/01/2018   Procedure: COLONOSCOPY WITH PROPOFOL;  Surgeon: [place], [name], MD;  Location: [facility] ENDOSCOPY;  Service: Gastroenterology;  Laterality: N/A;  . COLONOSCOPY WITH PROPOFOL N/A 04/09/2019   Procedure: COLONOSCOPY WITH PROPOFOL;  Surgeon: Carol Ada, MD;  Location: [place];  Service: Endoscopy;  Laterality: N/A;  . ENTEROSCOPY N/A 05/05/2019   Procedure: ENTEROSCOPY;  Surgeon: Ronnette Juniper, MD;  Location: [facility] ENDOSCOPY;  Service: Gastroenterology;  Laterality: N/A;  Push enteroscopy  . ENTEROSCOPY N/A 07/28/2019   Procedure: ENTEROSCOPY;  Surgeon: Virgel Manifold, MD;  Location: [facility] ENDOSCOPY;  Service: Endoscopy;  Laterality: N/A;  . ESOPHAGOGASTRODUODENOSCOPY (EGD) WITH PROPOFOL N/A 04/05/2019   Procedure: ESOPHAGOGASTRODUODENOSCOPY (EGD) WITH PROPOFOL;  Surgeon: Wilford Corner, MD;  Location: [place];  Service: Endoscopy;  Laterality: N/A;  . GIVENS CAPSULE STUDY N/A 05/01/2019   Procedure: GIVENS CAPSULE STUDY;  Surgeon: Ronald Lobo, MD;  Location: [place];  Service:  Endoscopy;  Laterality: N/A;  . HOT HEMOSTASIS N/A 04/09/2019   Procedure: HOT HEMOSTASIS (ARGON PLASMA COAGULATION/BICAP);  Surgeon: Carol Ada, MD;  Location: [place];  Service: Endoscopy;  Laterality: N/A;  . HOT HEMOSTASIS N/A 05/05/2019   Procedure: HOT HEMOSTASIS (ARGON PLASMA COAGULATION/BICAP);  Surgeon: Ronnette Juniper, MD;  Location: [place];  Service: Gastroenterology;  Laterality: N/A;  . INSERTION OF DIALYSIS CATHETER Right 03/30/2019   Procedure: Ultrasound guided right internal jugular tunneled dialysis catheter placement;  Surgeon: Marty Heck, MD;  [facility] OR;  Service: Vascular;  Laterality: Right;  . kidney stone    . REMOVAL OF A DIALYSIS CATHETER Right 06/26/2019   Procedure: REMOVAL OF A DIALYSIS CATHETER ( PD CATH REMOVAL AND I J CATH REMOVAL;  Surgeon: Katha Cabal, MD;  Location: [facility] ORS;  Service: Vascular;  Laterality: Right;  . THYROIDECTOMY    . VARICOCELE EXCISION    . VIDEO BRONCHOSCOPY Bilateral 05/25/2016   Procedure: VIDEO BRONCHOSCOPY WITHOUT FLUORO;  Surgeon: Juanito Doom, MD;  Location: [facility] ENDOSCOPY;  Service: Cardiopulmonary;  Laterality: Bilateral;    Prior to Admission medications   Medication Sig Start Date End Date Taking? Authorizing Provider  amoxicillin-clavulanate (AUGMENTIN) 500-125 MG tablet Take 1 tablet (500 mg total) by mouth daily. 01/28/20   Pokhrel, Corrie Mckusick, MD  atorvastatin (LIPITOR) 10 MG tablet  Take 10 mg by mouth daily. 01/08/20   [provider]  B Complex-C-Folic Acid (NEPHRO-VITE PO) Take 1 tablet by mouth daily.    [provider]  calcium acetate (PHOSLO) 667 MG capsule Take 2 capsules (1,334 mg total) by mouth 3 (three) times daily with meals. Patient taking differently: Take 1,334 mg by mouth in the morning and at bedtime.  03/20/19   Loletha Grayer, MD  clopidogrel (PLAVIX) 75 MG tablet Take 75 mg by mouth daily.    [provider]  escitalopram (LEXAPRO) 10 MG tablet Take  10 mg by mouth daily. 07/14/19   [provider]  gabapentin (NEURONTIN) 100 MG capsule Take 1 capsule (100 mg total) by mouth 2 (two) times daily. 01/28/20   Pokhrel, Corrie Mckusick, MD  hydrOXYzine (ATARAX/VISTARIL) 25 MG tablet Take 25 mg by mouth every 6 (six) hours as needed for itching.    [provider]  insulin glargine (LANTUS) 100 UNIT/ML injection Inject 15 Units into the skin at bedtime.     [provider]  lacosamide 100 MG TABS Take 1 tablet (100 mg total) by mouth 2 (two) times daily. 01/28/20 01/27/21  Pokhrel, Corrie Mckusick, MD  levothyroxine (SYNTHROID) 200 MCG tablet Take 200 mcg by mouth daily before breakfast.  02/25/19   [provider]  lidocaine-prilocaine (EMLA) cream Apply 1 application topically Every Tuesday,Thursday,and Saturday with dialysis.     [provider]  loperamide (IMODIUM) 2 MG capsule Take 1 capsule (2 mg total) by mouth as needed for diarrhea or loose stools. 01/28/20   Pokhrel, Corrie Mckusick, MD  losartan (COZAAR) 100 MG tablet Take 100 mg by mouth daily. 07/14/19   [provider]  ondansetron (ZOFRAN) 4 MG tablet Take 1 tablet (4 mg total) by mouth daily as needed for nausea or vomiting. 01/28/20   Pokhrel, Corrie Mckusick, MD  Pancrelipase, Lip-Prot-Amyl, 24000-76000 units CPEP Take 1 capsule by mouth 3 (three) times daily with meals.     [provider]  pantoprazole (PROTONIX) 40 MG tablet Take 1 tablet (40 mg total) by mouth 2 (two) times daily. 04/16/19 01/25/20  Kayleen Memos, DO  potassium chloride SA (KLOR-CON) 20 MEQ tablet Take 20 mEq by mouth 2 (two) times daily.    [provider]  traZODone (DESYREL) 100 MG tablet Take 100 mg by mouth at bedtime. 07/14/19   [provider]    Allergies Patient has no known allergies.  Family History  Problem Relation Age of Onset  . Diabetes Mother   . Diabetes Maternal Grandmother   . Diabetes Maternal Grandfather   . Lung cancer Father   . Emphysema  Paternal Grandfather     Social History Social History   Tobacco Use  . Smoking status: Light Tobacco Smoker    Packs/day: 0.25    Years: 40.00    Pack years: 10.00    Types: Cigarettes    Last attempt to quit: 05/25/2016    Years since quitting: 3.7  . Smokeless tobacco: Never Used  Vaping Use  . Vaping Use: Never used  Substance Use Topics  . Alcohol use: Yes    Alcohol/week: 8.0 standard drinks    Types: 8 Cans of beer per week    Comment: beer or wine   . Drug use: No    Review of Systems Constitutional: No fever/chills Eyes: No visual changes. ENT: No sore throat. Cardiovascular: Denies chest pain. Respiratory: Denies shortness of breath. Gastrointestinal: No abdominal pain.  No nausea, no vomiting.  No  diarrhea.   Genitourinary: Negative for dysuria. Musculoskeletal: Negative for back pain. Skin: Negative for rash. Neurological: Positive for seizure type episode. ____________________________________________   PHYSICAL EXAM:  VITAL SIGNS: ED Triage Vitals  Enc Vitals Group     BP 02/17/20 1528 (!) 152/98     Pulse Rate 02/17/20 1528 86     Resp 02/17/20 1528 17     Temp 02/17/20 1535 98.3 F (36.8 C)     Temp Source 02/17/20 1535 Oral     SpO2 02/17/20 1528 97 %     Weight 02/17/20 1529 176 lb (79.8 kg)     Height 02/17/20 1529 6' (1.829 m)     Head Circumference --      Peak Flow --      Pain Score 02/17/20 1529 0   Constitutional: Alert and oriented.  Eyes: Conjunctivae are normal.  ENT      Head: Normocephalic and atraumatic.      Nose: No congestion/rhinnorhea.      Mouth/Throat: Mucous membranes are moist.      Neck: No stridor. Hematological/Lymphatic/Immunilogical: No cervical lymphadenopathy. Cardiovascular: Normal rate, regular rhythm.  No murmurs, rubs, or gallops.  Respiratory: Normal respiratory effort without tachypnea nor retractions. Breath sounds are clear and equal bilaterally. No wheezes/rales/rhonchi. Gastrointestinal: Soft and  non tender. No rebound. No guarding.  Genitourinary: Deferred Musculoskeletal: Normal range of motion in all extremities. No lower extremity edema. Neurologic:  Slight confusion to the event, otherwise oriented. Moving all extremities.  Skin:  Skin is warm, dry and intact. No rash noted. Psychiatric: Mood and affect are normal. Speech and behavior are normal. Patient exhibits appropriate insight and judgment.  ____________________________________________    LABS (pertinent positives/negatives)  CBC wbc 5.1, hgb 11.1, plt 180 BMP na 136, k 4.3, cl 95, glu 77, cr 3.80 ____________________________________________   EKG  I, Nance Pear, attending physician, personally viewed and interpreted this EKG  EKG Time: 1517 Rate: 86 Rhythm: sinus rhythm with 1st degree av block Axis: left axis deviation Intervals: qtc 543 QRS: RBBB, LAFB ST changes: no st elevation Impression: abnormal ekg  ____________________________________________    RADIOLOGY  None  ____________________________________________   PROCEDURES  Procedures  ____________________________________________   INITIAL IMPRESSION / ASSESSMENT AND PLAN / ED COURSE  Pertinent labs & imaging results that were available during my care of the patient were reviewed by me and considered in my medical decision making (see chart for details).   Patient presented to the emergency department today after apparent seizure-like episode.  Patient does have a history of seizures and is currently on medication.  Patient's blood work without concerning electrolyte abnormality here.  Per chart review Dr. Melrose Nakayama had recommended going up to 150 mg twice daily on the Vimpat if patient continued to have seizures.  Per [employer] paperwork used only 100 mg twice daily.  Because of this will increase him to 150 mg twice daily.  Will give him first dose here in the emergency department.  Patient follow-up with  neurology.  ____________________________________________   FINAL CLINICAL IMPRESSION(S) / ED DIAGNOSES  Final diagnoses:  Seizure-like activity ([place])     Note: This dictation was prepared with Dragon dictation. Any transcriptional errors that result from this process are unintentional     Nance Pear, MD 02/17/20 [phone]

## 2020-02-17 NOTE — ED Notes (Signed)
Visitor at bedside.

## 2020-02-17 NOTE — ED Triage Notes (Signed)
Pt to ED from [facility] dialysis where pt was witnessed having an absent seizure lasting approximately 60mins. Per EMS staff lowered pt to floor during seizure. EMS stating pt was on keppra for seizures but was recently switched to another medication and seizure activity has increased per fmaily. Pt post ictal and A&Ox2. CBG 110.

## 2020-02-17 NOTE — ED Notes (Signed)
This RN called pharmacy to have medication tubed. Pharmacy stating medication needs to be mixed and will be sent up shortly. Will administer when medication received.

## 2020-03-15 ENCOUNTER — Other Ambulatory Visit: Payer: Self-pay

## 2020-03-15 ENCOUNTER — Emergency Department: Payer: Medicare PPO

## 2020-03-15 ENCOUNTER — Observation Stay
Admission: EM | Admit: 2020-03-15 | Discharge: 2020-03-17 | Disposition: A | Discharge status: Home / Self Care | Payer: Medicare PPO | Attending: Family Medicine | Admitting: Family Medicine

## 2020-03-15 DIAGNOSIS — E039 Hypothyroidism, unspecified: Secondary | ICD-10-CM | POA: Insufficient documentation

## 2020-03-15 DIAGNOSIS — N186 End stage renal disease: Secondary | ICD-10-CM | POA: Insufficient documentation

## 2020-03-15 DIAGNOSIS — Z992 Dependence on renal dialysis: Secondary | ICD-10-CM | POA: Diagnosis not present

## 2020-03-15 DIAGNOSIS — F17210 Nicotine dependence, cigarettes, uncomplicated: Secondary | ICD-10-CM | POA: Diagnosis not present

## 2020-03-15 DIAGNOSIS — J449 Chronic obstructive pulmonary disease, unspecified: Secondary | ICD-10-CM | POA: Diagnosis not present

## 2020-03-15 DIAGNOSIS — E1140 Type 2 diabetes mellitus with diabetic neuropathy, unspecified: Secondary | ICD-10-CM | POA: Diagnosis not present

## 2020-03-15 DIAGNOSIS — R531 Weakness: Secondary | ICD-10-CM | POA: Diagnosis present

## 2020-03-15 DIAGNOSIS — I120 Hypertensive chronic kidney disease with stage 5 chronic kidney disease or end stage renal disease: Secondary | ICD-10-CM | POA: Insufficient documentation

## 2020-03-15 DIAGNOSIS — I5032 Chronic diastolic (congestive) heart failure: Secondary | ICD-10-CM

## 2020-03-15 DIAGNOSIS — K922 Gastrointestinal hemorrhage, unspecified: Principal | ICD-10-CM | POA: Insufficient documentation

## 2020-03-15 DIAGNOSIS — R9431 Abnormal electrocardiogram [ECG] [EKG]: Secondary | ICD-10-CM

## 2020-03-15 DIAGNOSIS — K921 Melena: Secondary | ICD-10-CM | POA: Diagnosis not present

## 2020-03-15 DIAGNOSIS — E875 Hyperkalemia: Secondary | ICD-10-CM | POA: Diagnosis not present

## 2020-03-15 DIAGNOSIS — Z794 Long term (current) use of insulin: Secondary | ICD-10-CM

## 2020-03-15 DIAGNOSIS — F0390 Unspecified dementia without behavioral disturbance: Secondary | ICD-10-CM | POA: Diagnosis not present

## 2020-03-15 DIAGNOSIS — D175 Benign lipomatous neoplasm of intra-abdominal organs: Secondary | ICD-10-CM | POA: Diagnosis not present

## 2020-03-15 DIAGNOSIS — Z79899 Other long term (current) drug therapy: Secondary | ICD-10-CM | POA: Insufficient documentation

## 2020-03-15 DIAGNOSIS — K3189 Other diseases of stomach and duodenum: Secondary | ICD-10-CM | POA: Diagnosis not present

## 2020-03-15 DIAGNOSIS — Z20822 Contact with and (suspected) exposure to covid-19: Secondary | ICD-10-CM | POA: Insufficient documentation

## 2020-03-15 DIAGNOSIS — K222 Esophageal obstruction: Secondary | ICD-10-CM | POA: Insufficient documentation

## 2020-03-15 DIAGNOSIS — Z86010 Personal history of colonic polyps: Secondary | ICD-10-CM | POA: Insufficient documentation

## 2020-03-15 DIAGNOSIS — E119 Type 2 diabetes mellitus without complications: Secondary | ICD-10-CM

## 2020-03-15 DIAGNOSIS — E1122 Type 2 diabetes mellitus with diabetic chronic kidney disease: Secondary | ICD-10-CM | POA: Diagnosis not present

## 2020-03-15 DIAGNOSIS — Z87898 Personal history of other specified conditions: Secondary | ICD-10-CM

## 2020-03-15 DIAGNOSIS — Z7901 Long term (current) use of anticoagulants: Secondary | ICD-10-CM | POA: Diagnosis not present

## 2020-03-15 LAB — CBC WITH DIFFERENTIAL/PLATELET
Abs Immature Granulocytes: 0.03 10*3/uL (ref 0.00–0.07)
Basophils Absolute: 0.1 10*3/uL (ref 0.0–0.1)
Basophils Relative: 1 %
Eosinophils Absolute: 0.1 10*3/uL (ref 0.0–0.5)
Eosinophils Relative: 1 %
HCT: 35.5 % — ABNORMAL LOW (ref 39.0–52.0)
Hemoglobin: 10.8 g/dL — ABNORMAL LOW (ref 13.0–17.0)
Immature Granulocytes: 0 %
Lymphocytes Relative: 15 %
Lymphs Abs: 1.1 10*3/uL (ref 0.7–4.0)
MCH: 30.8 pg (ref 26.0–34.0)
MCHC: 30.4 g/dL (ref 30.0–36.0)
MCV: 101.1 fL — ABNORMAL HIGH (ref 80.0–100.0)
Monocytes Absolute: 0.6 10*3/uL (ref 0.1–1.0)
Monocytes Relative: 8 %
Neutro Abs: 5.3 10*3/uL (ref 1.7–7.7)
Neutrophils Relative %: 75 %
Platelets: 170 10*3/uL (ref 150–400)
RBC: 3.51 MIL/uL — ABNORMAL LOW (ref 4.22–5.81)
RDW: 14.9 % (ref 11.5–15.5)
WBC: 7.2 10*3/uL (ref 4.0–10.5)
nRBC: 0 % (ref 0.0–0.2)

## 2020-03-15 LAB — BASIC METABOLIC PANEL
Anion gap: 21 — ABNORMAL HIGH (ref 5–15)
BUN: 50 mg/dL — ABNORMAL HIGH (ref 8–23)
CO2: 20 mmol/L — ABNORMAL LOW (ref 22–32)
Calcium: 6.9 mg/dL — ABNORMAL LOW (ref 8.9–10.3)
Chloride: 93 mmol/L — ABNORMAL LOW (ref 98–111)
Creatinine, Ser: 8.64 mg/dL — ABNORMAL HIGH (ref 0.61–1.24)
GFR, Estimated: 6 mL/min — ABNORMAL LOW (ref >60)
Glucose, Bld: 200 mg/dL — ABNORMAL HIGH (ref 70–99)
Potassium: 6.1 mmol/L — ABNORMAL HIGH (ref 3.5–5.1)
Sodium: 134 mmol/L — ABNORMAL LOW (ref 135–145)

## 2020-03-15 LAB — CBG MONITORING, ED
Glucose-Capillary: 120 mg/dL — ABNORMAL HIGH (ref 70–99)
Glucose-Capillary: 106 mg/dL — ABNORMAL HIGH (ref 70–99)

## 2020-03-15 LAB — POTASSIUM: Potassium: 5 mmol/L (ref 3.5–5.1)

## 2020-03-15 MED ORDER — HEPARIN SODIUM (PORCINE) 5000 UNIT/ML IJ SOLN
5000.0000 [IU] | Freq: Three times a day (TID) | INTRAMUSCULAR | Status: DC
  Administered 2020-03-15 – 2020-03-16 (×2): 5000 [IU] via SUBCUTANEOUS
  Filled 2020-03-15 (×2): qty 1

## 2020-03-15 MED ORDER — SODIUM ZIRCONIUM CYCLOSILICATE 10 G PO PACK
10.0000 g | PACK | Freq: Once | ORAL | Status: AC
  Administered 2020-03-16: 10 g via ORAL
  Filled 2020-03-15: qty 1

## 2020-03-15 MED ORDER — SODIUM BICARBONATE 8.4 % IV SOLN
50.0000 meq | Freq: Once | INTRAVENOUS | Status: AC
  Administered 2020-03-15: 50 meq via INTRAVENOUS
  Filled 2020-03-15: qty 50

## 2020-03-15 MED ORDER — INSULIN ASPART 100 UNIT/ML ~~LOC~~ SOLN
5.0000 [IU] | Freq: Once | SUBCUTANEOUS | Status: AC
  Administered 2020-03-15: 5 [IU] via INTRAVENOUS
  Filled 2020-03-15: qty 1

## 2020-03-15 MED ORDER — SODIUM ZIRCONIUM CYCLOSILICATE 10 G PO PACK
10.0000 g | PACK | Freq: Once | ORAL | Status: AC
  Administered 2020-03-15: 10 g via ORAL
  Filled 2020-03-15 (×2): qty 1

## 2020-03-15 MED ORDER — DEXTROSE 50 % IV SOLN
1.0000 | Freq: Once | INTRAVENOUS | Status: AC
  Administered 2020-03-15: 50 mL via INTRAVENOUS
  Filled 2020-03-15: qty 50

## 2020-03-15 NOTE — ED Triage Notes (Signed)
Pt states he is here for confusion and unsteadiness on feet. States hemodialysis MWF, didn't go yesterday. Hx stroke a year ago. Speech clear. Moving all extremities. States symptoms since starting dialysis.
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
## 2020-03-15 NOTE — ED Provider Notes (Signed)
[facility] Emergency Department Provider Note   ____________________________________________   Event Date/Time   First MD Initiated Contact with Patient 03/15/20 2019     (approximate)  I have reviewed the triage vital signs and the nursing notes.   HISTORY  Chief Complaint Weakness    HPI Windle Huebert is a 70 y.o. male patient reports he missed his usual dialysis yesterday because of the weather.  Today he is very weak and can barely stand he says.  He is not having any cough or fever or chest pain shortness of breath or anything else just the weakness.        Past Medical History:  Diagnosis Date  . Anemia   . Anxiety   . BPH (benign prostatic hyperplasia)   . Chronic kidney disease   . Colon polyps   . COPD (chronic obstructive pulmonary disease) ([place])   . Depression   . Diabetes mellitus without complication ([place])   . Diverticulosis of colon   . History of kidney stones   . Hyperlipidemia   . Hypertension   . Hypothyroidism   . Nephrolithiasis   . Seizures ([place])    2015 last seizure  . Stroke [facility])     Patient Active Problem List   Diagnosis Date Noted  . Seizure ([place]) 01/25/2020  . Depression   . Acute GI bleeding 07/28/2019  . Melena 07/22/2019  . History of GI bleed 07/22/2019  . Current every day smoker 06/22/2019  . Falls frequently 06/22/2019  . Palliative care by specialist   . Goals of care, counseling/discussion   . Orthostasis 05/02/2019  . ESRD (end stage renal disease) on dialysis ([place])   . Acute blood loss anemia 04/30/2019  . Acute on chronic blood loss anemia 04/29/2019  . History of CVA (cerebrovascular accident) 04/29/2019  . GI bleed 04/04/2019  . 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[place]) 03/20/2019  . Generalized weakness   . Diabetic polyneuropathy associated with type 2 diabetes mellitus ([place])   . Cerebrovascular accident (CVA) ([place])   . PAD (peripheral artery disease) ([place]) 03/17/2019  . Ptosis of eyelid,  left 03/15/2019  . Physical deconditioning 03/14/2019  . Hypomagnesemia 03/13/2019  . Hyperphosphatemia 03/13/2019  . ESRD (end stage renal disease) ([place]) 03/13/2019  . Chronic kidney disease with peritoneal dialysis as preferred modality, stage 5 ([place]) 03/13/2019  . Prolonged QT interval 03/13/2019  . Hypertension associated with diabetes ([place]) 03/13/2019  . Hypothyroidism 03/13/2019  . Chronic diarrhea 03/13/2019  . Hypocalcemia 03/12/2019  . Ataxia 03/10/2019  . Dizziness 03/10/2019  . Vision problem 03/10/2019  . Hypokalemia 09/16/2018  . ESRD on peritoneal dialysis ([place]) 03/04/2018  . Lung nodule 06/12/2016  . Narrowing of airway   . Cigarette smoker 05/24/2016  . Collapse of right lung 05/24/2016  . COPD with chronic bronchitis ([place]) 05/24/2016  . Diarrhea due to malabsorption 07/01/2015  . Hx of adenomatous colonic polyps 07/01/2015  . Acne 05/20/2015  . Calculus of kidney 05/20/2015  . Chest pain, non-cardiac 05/20/2015  . Seizure disorder ([place]) 05/20/2015  . Current tobacco use 05/20/2015  . Chronic kidney disease (CKD), stage III (moderate) ([place]) 03/29/2015  . Insulin dependent type 2 diabetes mellitus ([place]) 03/15/2015  . Lipoma of shoulder 03/08/2014  . Other synovitis and tenosynovitis, right shoulder 03/08/2014  . Right supraspinatus tenosynovitis 03/08/2014  . Bursitis of elbow 02/15/2014  . Olecranon bursitis of right elbow 02/15/2014  . Absolute anemia 08/08/2013  . 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 Back  pain, chronic 08/08/2013  . Chronic obstructive pulmonary disease ([place]) 08/08/2013  . BP (high blood pressure) 08/08/2013  . Hyperlipidemia associated with type 2 diabetes mellitus ([place]) 08/08/2013  . Acne erythematosa 08/08/2013    Past Surgical History:  Procedure Laterality Date  . AV FISTULA PLACEMENT Left 03/30/2019   Procedure: right brachiocephalic fistula creation;  Surgeon: Marty Heck, MD;  Location: [place];  Service: Vascular;   Laterality: Left;  . BIOPSY  04/05/2019   Procedure: BIOPSY;  Surgeon: Wilford Corner, MD;  Location: [place];  Service: Endoscopy;;  . COLONOSCOPY    . COLONOSCOPY WITH PROPOFOL N/A 05/30/2015   Procedure: COLONOSCOPY WITH PROPOFOL;  Surgeon: Manya Silvas, MD;  Location[facility] ENDOSCOPY;  Service: Endoscopy;  Laterality: N/A;  . COLONOSCOPY WITH PROPOFOL N/A 12/01/2018   Procedure: COLONOSCOPY WITH PROPOFOL;  Surgeon: [place], [name], MD;  Location: [facility] ENDOSCOPY;  Service: Gastroenterology;  Laterality: N/A;  . COLONOSCOPY WITH PROPOFOL N/A 04/09/2019   Procedure: COLONOSCOPY WITH PROPOFOL;  Surgeon: Carol Ada, MD;  Location: [place];  Service: Endoscopy;  Laterality: N/A;  . ENTEROSCOPY N/A 05/05/2019   Procedure: ENTEROSCOPY;  Surgeon: Ronnette Juniper, MD;  Location: [facility] ENDOSCOPY;  Service: Gastroenterology;  Laterality: N/A;  Push enteroscopy  . ENTEROSCOPY N/A 07/28/2019   Procedure: ENTEROSCOPY;  Surgeon: Virgel Manifold, MD;  Location[facility] ENDOSCOPY;  Service: Endoscopy;  Laterality: N/A;  . ESOPHAGOGASTRODUODENOSCOPY (EGD) WITH PROPOFOL N/A 04/05/2019   Procedure: ESOPHAGOGASTRODUODENOSCOPY (EGD) WITH PROPOFOL;  Surgeon: Wilford Corner, MD;  Location: [place];  Service: Endoscopy;  Laterality: N/A;  . GIVENS CAPSULE STUDY N/A 05/01/2019   Procedure: GIVENS CAPSULE STUDY;  Surgeon: Ronald Lobo, MD;  Location: [place];  Service: Endoscopy;  Laterality: N/A;  . HOT HEMOSTASIS N/A 04/09/2019   Procedure: HOT HEMOSTASIS (ARGON PLASMA COAGULATION/BICAP);  Surgeon: Carol Ada, MD;  Location: [place];  Service: Endoscopy;  Laterality: N/A;  . HOT HEMOSTASIS N/A 05/05/2019   Procedure: HOT HEMOSTASIS (ARGON PLASMA COAGULATION/BICAP);  Surgeon: Ronnette Juniper, MD;  Location: [place];  Service: Gastroenterology;  Laterality: N/A;  . INSERTION OF DIALYSIS CATHETER Right 03/30/2019   Procedure: Ultrasound guided right internal jugular tunneled dialysis catheter  placement;  Surgeon: Marty Heck, MD;  Location: [facility] OR;  Service: Vascular;  Laterality: Right;  . kidney stone    . REMOVAL OF A DIALYSIS CATHETER Right 06/26/2019   Procedure: REMOVAL OF A DIALYSIS CATHETER ( PD CATH REMOVAL AND I J CATH REMOVAL;  Surgeon: Katha Cabal, MD;  Location: [facility] ORS;  Service: Vascular;  Laterality: Right;  . THYROIDECTOMY    . VARICOCELE EXCISION    . VIDEO BRONCHOSCOPY Bilateral 05/25/2016   Procedure: VIDEO BRONCHOSCOPY WITHOUT FLUORO;  Surgeon: Juanito Doom, MD;  Location: [facility] ENDOSCOPY;  Service: Cardiopulmonary;  Laterality: Bilateral;    Prior to Admission medications   Medication Sig Start Date End Date Taking? Authorizing Provider  amoxicillin-clavulanate (AUGMENTIN) 500-125 MG tablet Take 1 tablet (500 mg total) by mouth daily. 01/28/20   Pokhrel, Corrie Mckusick, MD  atorvastatin (LIPITOR) 10 MG tablet Take 10 mg by mouth daily. 01/08/20   [provider]  B Complex-C-Folic Acid (NEPHRO-VITE PO) Take 1 tablet by mouth daily.    [provider]  calcium acetate (PHOSLO) 667 MG capsule Take 2 capsules (1,334 mg total) by mouth 3 (three) times daily with meals. Patient taking differently: Take 1,334 mg by mouth in the morning and at bedtime.  03/20/19   Loletha Grayer, MD  clopidogrel (PLAVIX)  75 MG tablet Take 75 mg by mouth daily.    [provider]  escitalopram (LEXAPRO) 10 MG tablet Take 10 mg by mouth daily. 07/14/19   [provider]  gabapentin (NEURONTIN) 100 MG capsule Take 1 capsule (100 mg total) by mouth 2 (two) times daily. 01/28/20   Pokhrel, Corrie Mckusick, MD  hydrOXYzine (ATARAX/VISTARIL) 25 MG tablet Take 25 mg by mouth every 6 (six) hours as needed for itching.    [provider]  insulin glargine (LANTUS) 100 UNIT/ML injection Inject 15 Units into the skin at bedtime.     [provider]  Lacosamide (VIMPAT) 150 MG TABS Take 1 tablet (150 mg total) by mouth 2 (two) times daily.  02/17/20   Nance Pear, MD  lacosamide 100 MG TABS Take 1 tablet (100 mg total) by mouth 2 (two) times daily. 01/28/20 01/27/21  Pokhrel, Corrie Mckusick, MD  levothyroxine (SYNTHROID) 200 MCG tablet Take 200 mcg by mouth daily before breakfast.  02/25/19   [provider]  lidocaine-prilocaine (EMLA) cream Apply 1 application topically Every Tuesday,Thursday,and Saturday with dialysis.     [provider]  loperamide (IMODIUM) 2 MG capsule Take 1 capsule (2 mg total) by mouth as needed for diarrhea or loose stools. 01/28/20   Pokhrel, Corrie Mckusick, MD  losartan (COZAAR) 100 MG tablet Take 100 mg by mouth daily. 07/14/19   [provider]  ondansetron (ZOFRAN) 4 MG tablet Take 1 tablet (4 mg total) by mouth daily as needed for nausea or vomiting. 01/28/20   Pokhrel, Corrie Mckusick, MD  Pancrelipase, Lip-Prot-Amyl, 24000-76000 units CPEP Take 1 capsule by mouth 3 (three) times daily with meals.     [provider]  pantoprazole (PROTONIX) 40 MG tablet Take 1 tablet (40 mg total) by mouth 2 (two) times daily. 04/16/19 01/25/20  Kayleen Memos, DO  potassium chloride SA (KLOR-CON) 20 MEQ tablet Take 20 mEq by mouth 2 (two) times daily.    [provider]  traZODone (DESYREL) 100 MG tablet Take 100 mg by mouth at bedtime. 07/14/19   [provider]    Allergies Patient has no known allergies.  Family History  Problem Relation Age of Onset  . Diabetes Mother   . Diabetes Maternal Grandmother   . Diabetes Maternal Grandfather   . Lung cancer Father   . Emphysema Paternal Grandfather     Social History Social History   Tobacco Use  . Smoking status: Light Tobacco Smoker    Packs/day: 0.25    Years: 40.00    Pack years: 10.00    Types: Cigarettes    Last attempt to quit: 05/25/2016    Years since quitting: 3.8  . Smokeless tobacco: Never Used  Vaping Use  . Vaping Use: Never used  Substance Use Topics  . Alcohol use: Yes    Alcohol/week: 8.0 standard  drinks    Types: 8 Cans of beer per week    Comment: beer or wine   . Drug use: No    Review of Systems  Constitutional: No fever/chills Eyes: No visual changes. ENT: No sore throat. Cardiovascular: Denies chest pain. Respiratory: Denies shortness of breath. Gastrointestinal: No abdominal pain.  No nausea, no vomiting.  No diarrhea.  No constipation. Genitourinary: Negative for dysuria. Musculoskeletal: Negative for back pain. Skin: Negative for rash. Neurological: Negative for headaches, focal weakness   ____________________________________________   PHYSICAL EXAM:  VITAL SIGNS: ED Triage Vitals  Enc Vitals Group     BP 03/15/20 1648 137/72  Pulse Rate 03/15/20 1648 62     Resp 03/15/20 1648 18     Temp 03/15/20 1648 98.1 F (36.7 C)     Temp Source 03/15/20 1648 Oral     SpO2 03/15/20 1648 99 %     Weight 03/15/20 1648 176 lb (79.8 kg)     Height 03/15/20 1648 6' (1.829 m)     Head Circumference --      Peak Flow --      Pain Score 03/15/20 1657 0     Pain Loc --      Pain Edu? --      Excl. in [place]? --     Constitutional: Alert and oriented.  Feels diffusely weak and looks at. Eyes: Conjunctivae are normal. PER. Head: Atraumatic. Nose: No congestion/rhinnorhea. Mouth/Throat: Mucous membranes are moist.  Oropharynx non-erythematous. Neck: No stridor. Cardiovascular: Normal rate, regular rhythm. Grossly normal heart sounds.  Good peripheral circulation. Respiratory: Normal respiratory effort.  No retractions. Lungs CTAB. Gastrointestinal: Soft and nontender. No distention. No abdominal bruits.  Musculoskeletal: No lower extremity tenderness nor edema.   Neurologic:  Normal speech and language. No gross focal neurologic deficits are appreciated. Skin:  Skin is warm, dry and intact. No rash noted. Psychiatric: Mood and affect are normal. Speech and behavior are normal.  ____________________________________________   LABS (all labs ordered are listed, but  only abnormal results are displayed)  Labs Reviewed  CBC WITH DIFFERENTIAL/PLATELET - Abnormal; Notable for the following components:      Result Value   RBC 3.51 (*)    Hemoglobin 10.8 (*)    HCT 35.5 (*)    MCV 101.1 (*)    All other components within normal limits  BASIC METABOLIC PANEL - Abnormal; Notable for the following components:   Sodium 134 (*)    Potassium 6.1 (*)    Chloride 93 (*)    CO2 20 (*)    Glucose, Bld 200 (*)    BUN 50 (*)    Creatinine, Ser 8.64 (*)    Calcium 6.9 (*)    GFR, Estimated 6 (*)    Anion gap 21 (*)    All other components within normal limits  URINALYSIS, COMPLETE (UACMP) WITH MICROSCOPIC  CBG MONITORING, ED   ____________________________________________  EKG  EKG read interpreted by me shows normal sinus rhythm rate of 62 first-degree AV block right bundle branch block left axis no acute ST-T wave changes patient does have a prolonged QTC. ____________________________________________  RADIOLOGY Gertha Calkin, personally viewed and evaluated these images (plain radiographs) as part of my medical decision making, as well as reviewing the written report by the radiologist.  ED MD interpretation:   Official radiology report(s): No results found.  ____________________________________________   PROCEDURES  Procedure(s) performed (including Critical Care): Critical care time 15 minutes.  This includes reviewing the patient's old records and EKGs evaluating the patient and speaking to renal.  I still have to talk to the hospitalist.  Procedures   ____________________________________________   INITIAL IMPRESSION / ASSESSMENT AND PLAN / ED COURSE Patient with hyperkalemia and weakness.  He has a history of prolonged QTC interval.  Is actually longer than previous EKG.  I discussed the patient with Dr. Zollie Scale.  His dialysis nurses are still working to dialyze the mornings patients.  We will give the patient Lokelma in addition to  the insulin glucose and bicarb.  And a second dose in 6 to 8 hours.  We believe that this  will temporize the patient till we can dialyze him in the morning.  He is not having any signs of acute hyperkalemia on the EKG.               ____________________________________________   FINAL CLINICAL IMPRESSION(S) / ED DIAGNOSES  Final diagnoses:  Weakness  Hyperkalemia     ED Discharge Orders    [name]      *Please note:  Delmont Prosch was evaluated in Emergency Department on 03/15/2020 for the symptoms described in the history of present illness. He was evaluated in the context of the global COVID-19 pandemic, which necessitated consideration that the patient might be at risk for infection with the SARS-CoV-2 virus that causes COVID-19. Institutional protocols and algorithms that pertain to the evaluation of patients at risk for COVID-19 are in a state of rapid change based on information released by regulatory bodies including the CDC and federal and state organizations. These policies and algorithms were followed during the patient's care in the ED.  Some ED evaluations and interventions may be delayed as a result of limited staffing during and the pandemic.*   Note:  This document was prepared using Dragon voice recognition software and may include unintentional dictation errors.    Nena Polio, MD 03/15/20 2039

## 2020-03-15 NOTE — H&P (Signed)
History and Physical    Kyle [name] YQI:[id number] DOB: May 17, 1950 DOA: 03/15/2020  PCP: Idelle Crouch, MD  Patient coming from: Home  I have personally briefly reviewed patient's old medical records in [place]  Chief Complaint: weakness and confusion  HPI: Kyle [name] is a 70 y.o. 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  Patient missed his dialysis yesterday due to bad weather and began to feel very weak to the point that he could not stand.  Also felt that he was confused.  He denies any chest pain or palpitations.  No nausea vomiting or diarrhea.  He is still able to produce some urine with his end-stage renal disease.  ED Course: He was afebrile with otherwise normal vitals.  Labs notable for significant hyperkalemia of 6.1.  Creatinine of 8.64 from a baseline of about 6-7.  BUN of 50 from a prior of 16.  Anion gap of 21. CBC with stable anemia with hemoglobin of 10.8.  EKG without any signs of peaked T waves.  He was given insulin with D50, sodium bicarb by ED physician Dr.Paul Malinda. ED physician also consulted nephrology who recommended [facility] with repeat dose in 6-8 hrs. Pt was unable to be taken to dialysis due to nursing staffing shortage.  We will attempt to dialyze early in the morning.  Review of Systems: Constitutional: No Weight Change, No Fever ENT/Mouth: No sore throat, No Rhinorrhea Eyes: No Eye Pain, No Vision Changes Cardiovascular: No Chest Pain, no SOB, No Palpitations Respiratory: No Cough, No Sputum Gastrointestinal: No Nausea, No Vomiting, No Diarrhea, No Constipation, No Pain Genitourinary: no Urinary Incontinence, No Urgency, No Flank Pain Musculoskeletal: No Arthralgias, No Myalgias Skin: No Skin Lesions, No Pruritus, Neuro:+ Weakness, No  Numbness Psych: No Anxiety/Panic, No Depression,+ decrease appetite Heme/Lymph: No Bruising, No Bleeding  Past Medical History:  Diagnosis Date  . Anemia   . Anxiety   . BPH (benign prostatic hyperplasia)   . Chronic kidney disease   . Colon polyps   . COPD (chronic obstructive pulmonary disease) ([place])   . Depression   . Diabetes mellitus without complication ([place])   . Diverticulosis of colon   . History of kidney stones   . Hyperlipidemia   . Hypertension   . Hypothyroidism   . Nephrolithiasis   . Seizures ([place])    2015 last seizure  . Stroke [facility])     Past Surgical History:  Procedure Laterality Date  . AV FISTULA PLACEMENT Left 03/30/2019   Procedure: right brachiocephalic fistula creation;  Surgeon: Marty Heck, MD;  Location: [place];  Service: Vascular;  Laterality: Left;  . BIOPSY  04/05/2019   Procedure: BIOPSY;  Surgeon: Wilford Corner, MD;  Location: [place];  Service: Endoscopy;;  . COLONOSCOPY    . COLONOSCOPY WITH PROPOFOL N/A 05/30/2015   Procedure: COLONOSCOPY WITH PROPOFOL;  Surgeon: Manya Silvas, MD;  Location: [facility] ENDOSCOPY;  Service: Endoscopy;  Laterality: N/A;  . COLONOSCOPY WITH PROPOFOL N/A 12/01/2018   Procedure: COLONOSCOPY WITH PROPOFOL;  Surgeon: Toledo, [name], MD;  Location: [facility] ENDOSCOPY;  Service: Gastroenterology;  Laterality: N/A;  . COLONOSCOPY WITH PROPOFOL N/A 04/09/2019   Procedure: COLONOSCOPY WITH PROPOFOL;  Surgeon: Carol Ada, MD;  Location: [place];  Service: Endoscopy;  Laterality: N/A;  . ENTEROSCOPY N/A 05/05/2019   Procedure: ENTEROSCOPY;  Surgeon: Ronnette Juniper, MD;  Location: [place];  Service: Gastroenterology;  Laterality: N/A;  Push enteroscopy  . ENTEROSCOPY N/A 07/28/2019   Procedure: ENTEROSCOPY;  Surgeon: Virgel Manifold, MD;  Location: [facility] ENDOSCOPY;  Service: Endoscopy;  Laterality: N/A;  . ESOPHAGOGASTRODUODENOSCOPY (EGD) WITH PROPOFOL N/A 04/05/2019   Procedure: ESOPHAGOGASTRODUODENOSCOPY  (EGD) WITH PROPOFOL;  Surgeon: Wilford Corner, MD;  Location: [place];  Service: Endoscopy;  Laterality: N/A;  . GIVENS CAPSULE STUDY N/A 05/01/2019   Procedure: GIVENS CAPSULE STUDY;  Surgeon: Ronald Lobo, MD;  Location: [place];  Service: Endoscopy;  Laterality: N/A;  . HOT HEMOSTASIS N/A 04/09/2019   Procedure: HOT HEMOSTASIS (ARGON PLASMA COAGULATION/BICAP);  Surgeon: Carol Ada, MD;  Location: [place];  Service: Endoscopy;  Laterality: N/A;  . HOT HEMOSTASIS N/A 05/05/2019   Procedure: HOT HEMOSTASIS (ARGON PLASMA COAGULATION/BICAP);  Surgeon: Ronnette Juniper, MD;  Location: [place];  Service: Gastroenterology;  Laterality: N/A;  . INSERTION OF DIALYSIS CATHETER Right 03/30/2019   Procedure: Ultrasound guided right internal jugular tunneled dialysis catheter placement;  Surgeon: Marty Heck, MD;  Location: [facility] OR;  Service: Vascular;  Laterality: Right;  . kidney stone    . REMOVAL OF A DIALYSIS CATHETER Right 06/26/2019   Procedure: REMOVAL OF A DIALYSIS CATHETER ( PD CATH REMOVAL AND I J CATH REMOVAL;  Surgeon: Katha Cabal, MD;  Location: [facility] ORS;  Service: Vascular;  Laterality: Right;  . THYROIDECTOMY    . VARICOCELE EXCISION    . VIDEO BRONCHOSCOPY Bilateral 05/25/2016   Procedure: VIDEO BRONCHOSCOPY WITHOUT FLUORO;  Surgeon: Juanito Doom, MD;  Location: [facility] ENDOSCOPY;  Service: Cardiopulmonary;  Laterality: Bilateral;     reports that he has been smoking cigarettes. He has a 10.00 pack-year smoking history. He has never used smokeless tobacco. He reports current alcohol use of about 8.0 standard drinks of alcohol per week. He reports that he does not use drugs. Social History  No Known Allergies  Family History  Problem Relation Age of Onset  . Diabetes Mother   . Diabetes Maternal Grandmother   . Diabetes Maternal Grandfather   . Lung cancer Father   . Emphysema Paternal Grandfather      Prior to Admission medications   Medication Sig  Start Date End Date Taking? Authorizing Provider  amoxicillin-clavulanate (AUGMENTIN) 500-125 MG tablet Take 1 tablet (500 mg total) by mouth daily. 01/28/20   Pokhrel, Corrie Mckusick, MD  atorvastatin (LIPITOR) 10 MG tablet Take 10 mg by mouth daily. 01/08/20   [provider]  B Complex-C-Folic Acid (NEPHRO-VITE PO) Take 1 tablet by mouth daily.    [provider]  calcium acetate (PHOSLO) 667 MG capsule Take 2 capsules (1,334 mg total) by mouth 3 (three) times daily with meals. Patient taking differently: Take 1,334 mg by mouth in the morning and at bedtime.  03/20/19   Loletha Grayer, MD  clopidogrel (PLAVIX) 75 MG tablet Take 75 mg by mouth daily.    [provider]  escitalopram (LEXAPRO) 10 MG tablet Take 10 mg by mouth daily. 07/14/19   [provider]  gabapentin (NEURONTIN) 100 MG capsule Take 1 capsule (100 mg total) by mouth 2 (two) times daily. 01/28/20   Pokhrel, Corrie Mckusick, MD  hydrOXYzine (ATARAX/VISTARIL) 25 MG tablet Take 25 mg by mouth every 6 (six) hours as needed for itching.    [provider]  insulin glargine (LANTUS) 100 UNIT/ML injection Inject 15 Units into the skin at bedtime.     [provider]  Lacosamide (VIMPAT) 150 MG TABS Take 1 tablet (150 mg total) by mouth 2 (two) times daily. 02/17/20   Nance Pear, MD  lacosamide 100 MG TABS Take 1 tablet (100 mg total) by mouth 2 (two) times daily. 01/28/20 01/27/21  Pokhrel, Corrie Mckusick, MD  levothyroxine (SYNTHROID) 200 MCG tablet Take 200 mcg by mouth daily before breakfast.  02/25/19   [provider]  lidocaine-prilocaine (EMLA) cream Apply 1 application topically Every Tuesday,Thursday,and Saturday with dialysis.     [provider]  loperamide (IMODIUM) 2 MG capsule Take 1 capsule (2 mg total) by mouth as needed for diarrhea or loose stools. 01/28/20   Pokhrel, Corrie Mckusick, MD  losartan (COZAAR) 100 MG tablet Take 100 mg by mouth daily. 07/14/19   [provider]   ondansetron (ZOFRAN) 4 MG tablet Take 1 tablet (4 mg total) by mouth daily as needed for nausea or vomiting. 01/28/20   Pokhrel, Corrie Mckusick, MD  Pancrelipase, Lip-Prot-Amyl, 24000-76000 units CPEP Take 1 capsule by mouth 3 (three) times daily with meals.     [provider]  pantoprazole (PROTONIX) 40 MG tablet Take 1 tablet (40 mg total) by mouth 2 (two) times daily. 04/16/19 01/25/20  Kayleen Memos, DO  potassium chloride SA (KLOR-CON) 20 MEQ tablet Take 20 mEq by mouth 2 (two) times daily.    [provider]  traZODone (DESYREL) 100 MG tablet Take 100 mg by mouth at bedtime. 07/14/19   [provider]    Physical Exam: Vitals:   03/15/20 1648 03/15/20 1657 03/15/20 2107  BP: 137/72  (!) 143/77  Pulse: 62  77  Resp: 18  15  Temp: 98.1 F (36.7 C)    TempSrc: Oral    SpO2: 99%  99%  Weight: 79.8 kg 79.8 kg   Height: 6' (1.829 m) 6' (1.829 m)     Constitutional: NAD, calm, comfortable, elderly gentleman lying flat in bed Vitals:   03/15/20 1648 03/15/20 1657 03/15/20 2107  BP: 137/72  (!) 143/77  Pulse: 62  77  Resp: 18  15  Temp: 98.1 F (36.7 C)    TempSrc: Oral    SpO2: 99%  99%  Weight: 79.8 kg 79.8 kg   Height: 6' (1.829 m) 6' (1.829 m)    Eyes: PERRL, lids and conjunctivae normal ENMT: Mucous membranes are moist.  Neck: normal, supple Respiratory: clear to auscultation bilaterally, no wheezing, no crackles. Normal respiratory effort. No accessory muscle use.  Cardiovascular: Regular rate and rhythm, no murmurs / rubs / gallops. No extremity edema.  Abdomen: no tenderness, no masses palpated. Bowel sounds positive.  Musculoskeletal: no clubbing / cyanosis. No joint deformity upper and lower extremities.Normal muscle tone.  Skin: no rashes, lesions, ulcers. No induration Neurologic: CN 2-12 grossly intact. Sensation intact. Strength 5/5 in all 4.  Psychiatric: Normal judgment and insight. Alert and oriented x 3. Normal mood.     Labs on  Admission: I have personally reviewed following labs and imaging studies  CBC: Recent Labs  Lab 03/15/20 1653  WBC 7.2  NEUTROABS 5.3  HGB 10.8*  HCT 35.5*  MCV 101.1*  PLT 638   Basic Metabolic Panel: Recent Labs  Lab 03/15/20 1653 03/15/20 2230  NA 134*  --   K 6.1* 5.0  CL 93*  --   CO2 20*  --   GLUCOSE 200*  --   BUN 50*  --   CREATININE 8.64*  --   CALCIUM 6.9*  --    GFR: Estimated Creatinine  Clearance: 8.6 mL/min (A) (by C-G formula based on SCr of 8.64 mg/dL (H)). Liver Function Tests: No results for input(s): AST, ALT, ALKPHOS, BILITOT, PROT, ALBUMIN in the last 168 hours. No results for input(s): LIPASE, AMYLASE in the last 168 hours. No results for input(s): AMMONIA in the last 168 hours. Coagulation Profile: No results for input(s): INR, PROTIME in the last 168 hours. Cardiac Enzymes: No results for input(s): CKTOTAL, CKMB, CKMBINDEX, TROPONINI in the last 168 hours. BNP (last 3 results) No results for input(s): PROBNP in the last 8760 hours. HbA1C: No results for input(s): HGBA1C in the last 72 hours. CBG: Recent Labs  Lab 03/15/20 2109 03/15/20 2227  GLUCAP 120* 106*   Lipid Profile: No results for input(s): CHOL, HDL, LDLCALC, TRIG, CHOLHDL, LDLDIRECT in the last 72 hours. Thyroid Function Tests: No results for input(s): TSH, T4TOTAL, FREET4, T3FREE, THYROIDAB in the last 72 hours. Anemia Panel: No results for input(s): VITAMINB12, FOLATE, FERRITIN, TIBC, IRON, RETICCTPCT in the last 72 hours. Urine analysis:    Component Value Date/Time   COLORURINE YELLOW 03/22/2019 1622   APPEARANCEUR CLEAR 03/22/2019 1622   APPEARANCEUR Clear 08/08/2013 1947   LABSPEC 1.018 03/22/2019 1622   LABSPEC 1.012 08/08/2013 1947   PHURINE 5.0 03/22/2019 1622   GLUCOSEU >=500 (A) 03/22/2019 1622   GLUCOSEU 50 mg/dL 08/08/2013 1947   HGBUR NEGATIVE 03/22/2019 [place] 03/22/2019 1622   BILIRUBINUR Negative 08/08/2013 1947   KETONESUR  NEGATIVE 03/22/2019 1622   PROTEINUR >=300 (A) 03/22/2019 1622   NITRITE NEGATIVE 03/22/2019 1622   LEUKOCYTESUR NEGATIVE 03/22/2019 1622   LEUKOCYTESUR Negative 08/08/2013 1947    Radiological Exams on Admission: DG Chest Portable 1 View  Result Date: 03/15/2020 CLINICAL DATA:  Altered mental status. EXAM: PORTABLE CHEST 1 VIEW COMPARISON:  January 25, 2020 FINDINGS: Chronic appearing increased lung markings, without evidence of acute infiltrate, pleural effusion or pneumothorax. The cardiac silhouette is moderately enlarged. Is marked severity calcification of the aortic arch. Chronic right-sided rib fractures are seen. Multilevel degenerative changes are noted throughout the thoracic spine. IMPRESSION: Stable cardiomegaly with chronic appearing increased lung markings. Electronically Signed   By: Virgina Norfolk M.D.   On: 03/15/2020 20:56      Assessment/Plan  Hyperkalemia in the setting of ESRD on HD MWF Received D50 with insulin, sodium bicarb x1 and Lokelma in ED. No peaked T waves but has prolonged QT on EKG.  Will repeat Lokelma 6 hours from the first dose per nephrology recommendation. Follow potassium q4hrs. Repeat showing improving from 6.1 to 5. Nephrology unable to dialyze tonight due to staffing issues.  Will dialyze in the morning. Hold ACE as it can worsening hyperkalemia  Prolonged QT  Chronic Hold Lexapro and Trazadone  ESRD on HD MWF Missed dialysis Monday Dialyzed in the morning with close monitoring of hyperkalemia as above  Chronic diastolic heart failure Compensated and stable  Insulin-dependent type 2 diabetes Hold on insulin for now due to missed dialysis and worsening creatinine  Hx of seizure Continue Vimpat  DVT prophylaxis:.Heparin Code Status: Full Family Communication: Plan discussed with patient at bedside  disposition Plan: Home with observation Consults called:  Nephrology consulted by ED physician  Admission status:  Observation  Status is: Observation  The patient remains OBS appropriate and will d/c before 2 midnights.  Dispo: The patient is from: Home              Anticipated d/c is to: Home  Anticipated d/c date is: 1 day              Patient currently is not medically stable to d/c.         Orene Desanctis DO [facility]   If 7PM-7AM, please contact night-coverage [email or web address]   03/15/2020, 11:16 PM

## 2020-03-15 NOTE — ED Notes (Signed)
Pharmacy contacted requesting they send ordered [facility] packet for RN to administer.

## 2020-03-16 DIAGNOSIS — E875 Hyperkalemia: Secondary | ICD-10-CM | POA: Diagnosis not present

## 2020-03-16 DIAGNOSIS — I5032 Chronic diastolic (congestive) heart failure: Secondary | ICD-10-CM

## 2020-03-16 LAB — POTASSIUM
Potassium: 4.3 mmol/L (ref 3.5–5.1)
Potassium: 4.8 mmol/L (ref 3.5–5.1)

## 2020-03-16 LAB — CBC
HCT: 31.3 % — ABNORMAL LOW (ref 39.0–52.0)
Hemoglobin: 10.1 g/dL — ABNORMAL LOW (ref 13.0–17.0)
MCH: 31.5 pg (ref 26.0–34.0)
MCHC: 32.3 g/dL (ref 30.0–36.0)
MCV: 97.5 fL (ref 80.0–100.0)
Platelets: 144 10*3/uL — ABNORMAL LOW (ref 150–400)
RBC: 3.21 MIL/uL — ABNORMAL LOW (ref 4.22–5.81)
RDW: 14.9 % (ref 11.5–15.5)
WBC: 5.7 10*3/uL (ref 4.0–10.5)
nRBC: 0 % (ref 0.0–0.2)

## 2020-03-16 LAB — BASIC METABOLIC PANEL
Anion gap: 18 — ABNORMAL HIGH (ref 5–15)
BUN: 53 mg/dL — ABNORMAL HIGH (ref 8–23)
CO2: 22 mmol/L (ref 22–32)
Calcium: 6.7 mg/dL — ABNORMAL LOW (ref 8.9–10.3)
Chloride: 97 mmol/L — ABNORMAL LOW (ref 98–111)
Creatinine, Ser: 9.06 mg/dL — ABNORMAL HIGH (ref 0.61–1.24)
GFR, Estimated: 6 mL/min — ABNORMAL LOW (ref >60)
Glucose, Bld: 105 mg/dL — ABNORMAL HIGH (ref 70–99)
Potassium: 4.6 mmol/L (ref 3.5–5.1)
Sodium: 137 mmol/L (ref 135–145)

## 2020-03-16 LAB — PHOSPHORUS: Phosphorus: 4.8 mg/dL — ABNORMAL HIGH (ref 2.5–4.6)

## 2020-03-16 LAB — SARS CORONAVIRUS 2 (TAT 6-24 HRS): SARS Coronavirus 2: NEGATIVE

## 2020-03-16 MED ORDER — LEVETIRACETAM 500 MG PO TABS
500.0000 mg | ORAL_TABLET | Freq: Two times a day (BID) | ORAL | Status: DC
  Administered 2020-03-16 – 2020-03-17 (×2): 500 mg via ORAL
  Filled 2020-03-16 (×4): qty 1

## 2020-03-16 MED ORDER — ESCITALOPRAM OXALATE 10 MG PO TABS
10.0000 mg | ORAL_TABLET | Freq: Every day | ORAL | Status: DC
  Administered 2020-03-17: 10 mg via ORAL
  Filled 2020-03-16 (×2): qty 1

## 2020-03-16 MED ORDER — GABAPENTIN 100 MG PO CAPS
100.0000 mg | ORAL_CAPSULE | Freq: Two times a day (BID) | ORAL | Status: DC
  Administered 2020-03-16 – 2020-03-17 (×2): 100 mg via ORAL
  Filled 2020-03-16 (×2): qty 1

## 2020-03-16 MED ORDER — TRAZODONE HCL 100 MG PO TABS
100.0000 mg | ORAL_TABLET | Freq: Every day | ORAL | Status: DC
  Administered 2020-03-16: 100 mg via ORAL
  Filled 2020-03-16: qty 1

## 2020-03-16 MED ORDER — CLOPIDOGREL BISULFATE 75 MG PO TABS
75.0000 mg | ORAL_TABLET | Freq: Every day | ORAL | Status: DC
Start: 2020-03-16 — End: 2020-03-16

## 2020-03-16 MED ORDER — PANTOPRAZOLE SODIUM 40 MG IV SOLR
40.0000 mg | Freq: Two times a day (BID) | INTRAVENOUS | Status: DC
  Administered 2020-03-16 – 2020-03-17 (×2): 40 mg via INTRAVENOUS
  Filled 2020-03-16 (×2): qty 40

## 2020-03-16 MED ORDER — METOPROLOL SUCCINATE ER 50 MG PO TB24
50.0000 mg | ORAL_TABLET | Freq: Every day | ORAL | Status: DC
  Administered 2020-03-16 – 2020-03-17 (×2): 50 mg via ORAL
  Filled 2020-03-16 (×2): qty 1

## 2020-03-16 MED ORDER — CALCIUM ACETATE (PHOS BINDER) 667 MG PO CAPS
1334.0000 mg | ORAL_CAPSULE | Freq: Three times a day (TID) | ORAL | Status: DC
  Administered 2020-03-16 – 2020-03-17 (×4): 1334 mg via ORAL
  Filled 2020-03-16 (×5): qty 2

## 2020-03-16 MED ORDER — PANTOPRAZOLE SODIUM 40 MG PO TBEC
40.0000 mg | DELAYED_RELEASE_TABLET | Freq: Every day | ORAL | Status: DC
Start: 2020-03-16 — End: 2020-03-16

## 2020-03-16 MED ORDER — LACOSAMIDE 50 MG PO TABS
150.0000 mg | ORAL_TABLET | Freq: Two times a day (BID) | ORAL | Status: DC
  Filled 2020-03-16: qty 3

## 2020-03-16 MED ORDER — PHENYLEPHRINE-MINERAL OIL-PET 0.25-14-74.9 % RE OINT
1.0000 "application " | TOPICAL_OINTMENT | Freq: Two times a day (BID) | RECTAL | Status: DC | PRN
  Filled 2020-03-16: qty 57

## 2020-03-16 MED ORDER — PANCRELIPASE (LIP-PROT-AMYL) 12000-38000 UNITS PO CPEP
12000.0000 [IU] | ORAL_CAPSULE | Freq: Three times a day (TID) | ORAL | Status: DC
  Administered 2020-03-16 – 2020-03-17 (×4): 12000 [IU] via ORAL
  Filled 2020-03-16 (×5): qty 1

## 2020-03-16 MED ORDER — LEVOTHYROXINE SODIUM 50 MCG PO TABS
200.0000 ug | ORAL_TABLET | Freq: Every day | ORAL | Status: DC
  Administered 2020-03-17: 200 ug via ORAL
  Filled 2020-03-16: qty 4

## 2020-03-16 MED ORDER — ATORVASTATIN CALCIUM 10 MG PO TABS
10.0000 mg | ORAL_TABLET | Freq: Every day | ORAL | Status: DC
  Administered 2020-03-16 – 2020-03-17 (×2): 10 mg via ORAL
  Filled 2020-03-16 (×2): qty 1

## 2020-03-16 NOTE — ED Notes (Signed)
PT at bedside.

## 2020-03-16 NOTE — Progress Notes (Signed)
BFR down to 350 due to high AP RN aware 

## 2020-03-16 NOTE — ED Notes (Signed)
Patient reports Vimpat gives him seizures and states he takes Keppra. Pt remains unable to urinate and states he makes very little urine.

## 2020-03-16 NOTE — ED Notes (Addendum)
Order obtained for hemorrhoid cream. Pt aware.

## 2020-03-16 NOTE — Consult Note (Signed)
Consultation  Referring Provider:     Dr. Loleta Books Admit date: 03/15/2020 Consult date: 03/16/2020         Reason for Consultation:   Melena           HPI:   Kyle [name] is a 70 y.o. 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 Patient takes plavix MWF for history of stroke with last dose being this Monday per patient. Has had several melenic bowel movements but his last bowel movement was brown. He has had extensive evaluation relatively recently. His baseline hemoglobin is 8-9 and he is around 10. He missed a dialysis session due to the ice and so his electrolytes have been slightly abnormal. Due to received dialysis today. Spoke with wife to corroborate and she said it was dark tarry bowel movements. No NSAID use. 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 Regardless, given his history he warrants a repeat EGD. Patient still smokes and drinks occasionally.   Past Medical History:  Diagnosis Date  . Anemia   . Anxiety   . BPH (benign prostatic hyperplasia)   . Chronic kidney disease   . Colon polyps   . COPD (chronic obstructive pulmonary disease) ([place])   . Depression   . Diabetes mellitus without complication ([place])   . Diverticulosis of colon   . History of kidney stones   . Hyperlipidemia   . Hypertension   . Hypothyroidism   . Nephrolithiasis   . Seizures ([place])    2015 last seizure  . Stroke [facility])     Past Surgical History:  Procedure Laterality Date  . AV FISTULA PLACEMENT Left 03/30/2019   Procedure: right brachiocephalic fistula creation;  Surgeon: Marty Heck, MD;  Location: [place];  Service: Vascular;  Laterality: Left;  . BIOPSY  04/05/2019   Procedure: BIOPSY;  Surgeon: Wilford Corner, MD;  Location: [place];  Service: Endoscopy;;  . COLONOSCOPY    . COLONOSCOPY WITH PROPOFOL N/A 05/30/2015   Procedure: COLONOSCOPY WITH PROPOFOL;   Surgeon: Manya Silvas, MD;  Location: [facility] ENDOSCOPY;  Service: Endoscopy;  Laterality: N/A;  . COLONOSCOPY WITH PROPOFOL N/A 12/01/2018   Procedure: COLONOSCOPY WITH PROPOFOL;  Surgeon: Toledo, [name], MD;  Location: [facility] ENDOSCOPY;  Service: Gastroenterology;  Laterality: N/A;  . COLONOSCOPY WITH PROPOFOL N/A 04/09/2019   Procedure: COLONOSCOPY WITH PROPOFOL;  Surgeon: Carol Ada, MD;  Location: [place];  Service: Endoscopy;  Laterality: N/A;  . ENTEROSCOPY N/A 05/05/2019   Procedure: ENTEROSCOPY;  Surgeon: Ronnette Juniper, MD;  Location: [facility] ENDOSCOPY;  Service: Gastroenterology;  Laterality: N/A;  Push enteroscopy  . ENTEROSCOPY N/A 07/28/2019   Procedure: ENTEROSCOPY;  Surgeon: Virgel Manifold, MD;  Location: [facility] ENDOSCOPY;  Service: Endoscopy;  Laterality: N/A;  . ESOPHAGOGASTRODUODENOSCOPY (EGD) WITH PROPOFOL N/A 04/05/2019   Procedure: ESOPHAGOGASTRODUODENOSCOPY (EGD) WITH PROPOFOL;  Surgeon: Wilford Corner, MD;  Location: [place];  Service: Endoscopy;  Laterality: N/A;  . GIVENS CAPSULE STUDY N/A 05/01/2019   Procedure: GIVENS CAPSULE STUDY;  Surgeon: Ronald Lobo, MD;  Location: [place];  Service: Endoscopy;  Laterality: N/A;  . HOT HEMOSTASIS N/A 04/09/2019   Procedure: HOT HEMOSTASIS (ARGON PLASMA COAGULATION/BICAP);  Surgeon: Carol Ada, MD;  Location: [place];  Service: Endoscopy;  Laterality: N/A;  . HOT HEMOSTASIS N/A 05/05/2019   Procedure: HOT HEMOSTASIS (ARGON PLASMA COAGULATION/BICAP);  Surgeon: Ronnette Juniper, MD;  Location: [facility] ENDOSCOPY;  Service: Gastroenterology;  Laterality: N/A;  . INSERTION OF DIALYSIS CATHETER Right 03/30/2019   Procedure: Ultrasound guided right internal jugular tunneled dialysis catheter placement;  Surgeon: Marty Heck, MD;  Location: [facility] OR;  Service: Vascular;  Laterality: Right;  . kidney stone    . REMOVAL OF A DIALYSIS CATHETER Right 06/26/2019   Procedure: REMOVAL OF A DIALYSIS CATHETER ( PD CATH REMOVAL AND I J  CATH REMOVAL;  Surgeon: Katha Cabal, MD;  Location: [facility] ORS;  Service: Vascular;  Laterality: Right;  . THYROIDECTOMY    . VARICOCELE EXCISION    . VIDEO BRONCHOSCOPY Bilateral 05/25/2016   Procedure: VIDEO BRONCHOSCOPY WITHOUT FLUORO;  Surgeon: Juanito Doom, MD;  Location: [facility] ENDOSCOPY;  Service: Cardiopulmonary;  Laterality: Bilateral;    Family History  Problem Relation Age of Onset  . Diabetes Mother   . Diabetes Maternal Grandmother   . Diabetes Maternal Grandfather   . Lung cancer Father   . Emphysema Paternal Grandfather     Social History   Tobacco Use  . Smoking status: Light Tobacco Smoker    Packs/day: 0.25    Years: 40.00    Pack years: 10.00    Types: Cigarettes    Last attempt to quit: 05/25/2016    Years since quitting: 3.8  . Smokeless tobacco: Never Used  Vaping Use  . Vaping Use: Never used  Substance Use Topics  . Alcohol use: Yes    Alcohol/week: 8.0 standard drinks    Types: 8 Cans of beer per week    Comment: beer or wine   . Drug use: No    Prior to Admission medications   Medication Sig Start Date End Date Taking? Authorizing Provider  atorvastatin (LIPITOR) 10 MG tablet Take 10 mg by mouth daily. 01/08/20  Yes [provider]  B Complex-C-Folic Acid (NEPHRO-VITE PO) Take 1 tablet by mouth daily.   Yes [provider]  calcium acetate (PHOSLO) 667 MG capsule Take 2 capsules (1,334 mg total) by mouth 3 (three) times daily with meals. 03/20/19  Yes Wieting, Richard, MD  clopidogrel (PLAVIX) 75 MG tablet Take 75 mg by mouth daily.   Yes [provider]  escitalopram (LEXAPRO) 10 MG tablet Take 10 mg by mouth daily. 07/14/19  Yes [provider]  gabapentin (NEURONTIN) 100 MG capsule Take 1 capsule (100 mg total) by mouth 2 (two) times daily. 01/28/20  Yes Pokhrel, Laxman, MD  levETIRAcetam (KEPPRA) 500 MG tablet Take 500 mg by mouth 2 (two) times daily. 03/09/20  Yes [provider]  levothyroxine  (SYNTHROID) 200 MCG tablet Take 200 mcg by mouth daily before breakfast.  02/25/19  Yes [provider]  lidocaine-prilocaine (EMLA) cream Apply 1 application topically Every Tuesday,Thursday,and Saturday with dialysis.    Yes [provider]  metoprolol succinate (TOPROL-XL) 50 MG 24 hr tablet Take 1 tablet by mouth daily. 02/22/20  Yes [provider]  Pancrelipase, Lip-Prot-Amyl, 24000-76000 units CPEP Take 1 capsule by mouth 3 (three) times daily with meals.    Yes [provider]  pantoprazole (PROTONIX) 40 MG tablet Take 40 mg by mouth daily.   Yes [provider]  traZODone (DESYREL) 100 MG tablet Take 100 mg by mouth at bedtime. 07/14/19  Yes [provider]    Current Facility-Administered Medications  Medication Dose Route Frequency Provider Last Rate Last Admin  . atorvastatin (LIPITOR) tablet 10 mg  10 mg Oral Daily Danford, Suann Larry, MD      .  calcium acetate (PHOSLO) capsule 1,334 mg  1,334 mg Oral TID WC Danford, Suann Larry, MD      . Derrill Memo ON 03/17/2020] escitalopram (LEXAPRO) tablet 10 mg  10 mg Oral Daily Danford, Suann Larry, MD      . gabapentin (NEURONTIN) capsule 100 mg  100 mg Oral BID Danford, Suann Larry, MD      . levETIRAcetam (KEPPRA) tablet 500 mg  500 mg Oral BID Edwin Dada, MD      . Derrill Memo ON 03/17/2020] levothyroxine (SYNTHROID) tablet 200 mcg  200 mcg Oral QAC breakfast Danford, Suann Larry, MD      . lipase/protease/amylase (CREON) capsule 12,000 Units  12,000 Units Oral TID WC Danford, Suann Larry, MD      . metoprolol succinate (TOPROL-XL) 24 hr tablet 50 mg  50 mg Oral Daily Danford, Christopher P, MD      . pantoprazole (PROTONIX) injection 40 mg  40 mg Intravenous Q12H Danford, Suann Larry, MD      . traZODone (DESYREL) tablet 100 mg  100 mg Oral QHS Danford, Suann Larry, MD       Current Outpatient Medications  Medication Sig Dispense Refill  . atorvastatin (LIPITOR) 10  MG tablet Take 10 mg by mouth daily.    . B Complex-C-Folic Acid (NEPHRO-VITE PO) Take 1 tablet by mouth daily.    . calcium acetate (PHOSLO) 667 MG capsule Take 2 capsules (1,334 mg total) by mouth 3 (three) times daily with meals. 180 capsule 0  . clopidogrel (PLAVIX) 75 MG tablet Take 75 mg by mouth daily.    [name] escitalopram (LEXAPRO) 10 MG tablet Take 10 mg by mouth daily.    [name] gabapentin (NEURONTIN) 100 MG capsule Take 1 capsule (100 mg total) by mouth 2 (two) times daily.    [name] levETIRAcetam (KEPPRA) 500 MG tablet Take 500 mg by mouth 2 (two) times daily.    [name] levothyroxine (SYNTHROID) 200 MCG tablet Take 200 mcg by mouth daily before breakfast.     . lidocaine-prilocaine (EMLA) cream Apply 1 application topically Every Tuesday,Thursday,and Saturday with dialysis.     [name] metoprolol succinate (TOPROL-XL) 50 MG 24 hr tablet Take 1 tablet by mouth daily.    . Pancrelipase, Lip-Prot-Amyl, 24000-76000 units CPEP Take 1 capsule by mouth 3 (three) times daily with meals.     . pantoprazole (PROTONIX) 40 MG tablet Take 40 mg by mouth daily.    . traZODone (DESYREL) 100 MG tablet Take 100 mg by mouth at bedtime.      Allergies as of 03/15/2020  . (No Known Allergies)     Review of Systems:     Review of Systems  Constitutional: Positive for malaise/fatigue. Negative for chills and fever.  Respiratory: Negative for shortness of breath.   Cardiovascular: Negative for chest pain.  Gastrointestinal: Positive for melena. Negative for abdominal pain and nausea.  Musculoskeletal: Positive for joint pain.  Skin: Negative for rash.  Neurological: Negative for focal weakness.  Psychiatric/Behavioral: Negative for substance abuse.  All other systems reviewed and are negative.       Physical Exam:  Vital signs in last 24 hours: Temp:  [98.1 F (36.7 C)-98.2 F (36.8 C)] 98.2 F (36.8 C) (01/19 1515) Pulse Rate:  [62-87] 85 (01/19 1515) Resp:  [13-21] 20 (01/19 1515) BP: (130-175)/(68-117)  151/81 (01/19 1515) SpO2:  [92 %-100 %] 96 % (01/19 1200) Weight:  [79.8 kg] 79.8 kg (01/19 1002)   General:   Pleasant in NAD Head:  Normocephalic  and atraumatic. Eyes:   No icterus.   Conjunctiva pink. Mouth: Mucosa pink moist, no lesions. Neck:  Supple; no masses felt Lungs: No respiratory distress Abdomen:   Flat, soft, nondistended, nontender. No appreciable masses or hepatomegaly. No rebound signs or other peritoneal signs. Msk:  No clubbing or cyanosis. Strength 5/5. Symmetrical without gross deformities. Neurologic:  Alert and  oriented x4;  Cranial nerves II-XII intact.  Skin:  Warm, dry, pink without significant lesions or rashes. Psych:  Alert and cooperative. Normal affect.  LAB RESULTS: Recent Labs    03/15/20 1653 03/16/20 0520  WBC 7.2 5.7  HGB 10.8* 10.1*  HCT 35.5* 31.3*  PLT 170 144*   BMET Recent Labs    03/15/20 1653 03/15/20 2230 03/16/20 0241 03/16/20 0520 03/16/20 1202  NA 134*  --   --  137  --   K 6.1*   < > 4.3 4.6 4.8  CL 93*  --   --  97*  --   CO2 20*  --   --  22  --   GLUCOSE 200*  --   --  105*  --   BUN 50*  --   --  53*  --   CREATININE 8.64*  --   --  9.06*  --   CALCIUM 6.9*  --   --  6.7*  --    < > = values in this interval not displayed.   LFT No results for input(s): PROT, ALBUMIN, AST, ALT, ALKPHOS, BILITOT, BILIDIR, IBILI in the last 72 hours. PT/INR No results for input(s): LABPROT, INR in the last 72 hours.  STUDIES: DG Chest Portable 1 View  Result Date: 03/15/2020 CLINICAL DATA:  Altered mental status. EXAM: PORTABLE CHEST 1 VIEW COMPARISON:  January 25, 2020 FINDINGS: Chronic appearing increased lung markings, without evidence of acute infiltrate, pleural effusion or pneumothorax. The cardiac silhouette is moderately enlarged. Is marked severity calcification of the aortic arch. Chronic right-sided rib fractures are seen. Multilevel degenerative changes are noted throughout the thoracic spine. IMPRESSION: Stable  cardiomegaly with chronic appearing increased lung markings. Electronically Signed   By: Virgina Norfolk M.D.   On: 03/15/2020 20:56       Impression / Plan:   70 y/o gentleman with mild dementia, COPD, ESRD, and history of GI bleeding from avm here with report of melena for several days but hemoglobin above baseline. Regardless given history, repeat EGD is warranted.  - patient will get dialysis today - NPO at midnight - maintain adequate IV access - can use PPI IV BID - further recs after EGD tomorrow  Please call with any questions or concerns  Raylene Miyamoto MD, MPH [place]

## 2020-03-16 NOTE — ED Notes (Signed)
Pt had large loose BM. Pt cleaned up. Linens changed and clean chux and brief placed on pt. Pt placed in clean gown and given blanket.  ADmit MD at bedside now

## 2020-03-16 NOTE — ED Notes (Signed)
Dialysis called and report given. Dialysis will put in transport and send up in little while.

## 2020-03-16 NOTE — Evaluation (Signed)
Physical Therapy Evaluation Patient Details Name: Kyle [name] MRN: [id number] DOB: 21-Oct-1950 Today's Date: 03/16/2020   History of Present Illness  Pt is a 70 y.o. 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    Clinical Impression  Patient alert, agreeable to PT. PLOF gathered by OT, but pt reported he is ambulatory with RW, has his wife that he lives with, able to assist as she can. Denied assistance needed for ADLs.  The patient demonstrated bed mobility with supervision, able to sit with good balance, several minutes spent in sitting due to dizziness which the pt endorsed is normal for him. He was able to sit <> stand with RW and supervision and ambulated ~69ft. He demonstrated narrow base of support and impulsivity with walker, but no LOB noted. Returned to bed and to supine without assist.  Overall the patient demonstrated deficits (see "PT Problem List") that impede the patient's functional abilities, safety, and mobility and would benefit from skilled PT intervention. Recommendation is HHPT to maximize safety, mobility, and independence.     Follow Up Recommendations Home health PT;Supervision for mobility/OOB    Equipment Recommendations  3in1 (PT)    Recommendations for Other Services       Precautions / Restrictions Precautions Precautions: Fall Precaution Comments: dizziness Restrictions Weight Bearing Restrictions: No      Mobility  Bed Mobility Overal bed mobility: Needs Assistance Bed Mobility: Supine to Sit;Sit to Supine     Supine to sit: Supervision;HOB elevated Sit to supine: HOB elevated;Supervision        Transfers Overall transfer level: Needs assistance Equipment used: Rolling walker (2 wheeled) Transfers: Sit to/from Stand Sit to Stand:  Supervision         General transfer comment: pt pulls on RW to stand, impulsively returns to sitting after ambulation  Ambulation/Gait   Gait Distance (Feet): 70 Feet Assistive device: Rolling walker (2 wheeled)       General Gait Details: narrow base of support, impulsive with RW but no LOB noted, cued to maintain walker on the floor with turns  Stairs            Wheelchair Mobility    Modified Rankin (Stroke Patients Only)       Balance Overall balance assessment: Needs assistance Sitting-balance support: Feet supported Sitting balance-Leahy Scale: Good       Standing balance-Leahy Scale: Good Standing balance comment: safety improved with RW                             Pertinent Vitals/Pain Pain Assessment: Faces Faces Pain Scale: Hurts little more Pain Location: buttocks, pt references hemmorhoids Pain Descriptors / Indicators: Grimacing Pain Intervention(s): Limited activity within patient's tolerance;Monitored during session    Home Living Family/patient expects to be discharged to:: Private residence Living Arrangements: Spouse/significant other Available Help at Discharge: Family;Available 24 hours/day Type of Home: House (townhome) Home Access: Stairs to enter Entrance Stairs-Rails: Right Entrance Stairs-Number of Steps: 6 Home Layout: Two level Home Equipment: [place] - 2 wheels;Cane - single point;Shower seat      Prior Function Level of Independence: Needs assistance   Gait / Transfers Assistance Needed: RW at baseline for ambulation  ADL's / Homemaking Assistance Needed: Pt stated he does not normally need assistance  Comments: Per OT,  pt reported 5-6 falls in the last 6 months, stated he "falls all the time"     Hand Dominance   Dominant Hand: Right    Extremity/Trunk Assessment   Upper Extremity Assessment Upper Extremity Assessment: Defer to OT evaluation    Lower Extremity Assessment Lower Extremity  Assessment: RLE deficits/detail;LLE deficits/detail RLE Deficits / Details: grossly 4+/5 LLE Deficits / Details: grossly 4+/5    Cervical / Trunk Assessment Cervical / Trunk Assessment: Normal  Communication   Communication: No difficulties  Cognition Arousal/Alertness: Awake/alert Behavior During Therapy: WFL for tasks assessed/performed Overall Cognitive Status: No family/caregiver present to determine baseline cognitive functioning                                 General Comments: pt oriented to self, place, situation      General Comments      Exercises     Assessment/Plan    PT Assessment Patient needs continued PT services  PT Problem List Decreased activity tolerance;Decreased safety awareness       PT Treatment Interventions DME instruction;Therapeutic exercise;Gait training;Balance training;Stair training;Functional mobility training;Neuromuscular re-education;Therapeutic activities;Patient/family education    PT Goals (Current goals can be found in the Care Plan section)  Acute Rehab PT Goals Patient Stated Goal: to decrease his pain PT Goal Formulation: With patient Time For Goal Achievement: 03/30/20 Potential to Achieve Goals: Good    Frequency Min 2X/week   Barriers to discharge        Co-evaluation               AM-PAC PT "6 Clicks" Mobility  Outcome Measure Help needed turning from your back to your side while in a flat bed without using bedrails?: None Help needed moving from lying on your back to sitting on the side of a flat bed without using bedrails?: None Help needed moving to and from a bed to a chair (including a wheelchair)?: None Help needed standing up from a chair using your arms (e.g., wheelchair or bedside chair)?: None Help needed to walk in hospital room?: A Little Help needed climbing 3-5 steps with a railing? : A Little 6 Click Score: 22    End of Session Equipment Utilized During Treatment: Gait  belt Activity Tolerance: Patient tolerated treatment well Patient left: in bed;with bed alarm set Nurse Communication: Mobility status PT Visit Diagnosis: Other abnormalities of gait and mobility (R26.89);Muscle weakness (generalized) (M62.81);Difficulty in walking, not elsewhere classified (R26.2)    Time: 2620-3559 PT Time Calculation (min) (ACUTE ONLY): 12 min   Charges:   PT Evaluation $PT Eval Low Complexity: 1 Low          Lieutenant Diego PT, DPT 12:47 PM,03/16/20

## 2020-03-16 NOTE — Progress Notes (Signed)
[facility] PROGRESS NOTE    Levelle Edelen  IRJ:[id number] DOB: 1950-12-29 DOA: 03/15/2020 PCP: Idelle Crouch, MD      Brief Narrative:  Mr. Lia is a 70 y.o. M with CVA and seizures, dementia, homedwelling, right hemiparesis mild, ESRD and DM who presented with melena.  Evidently, over the last 2-3 days, patient has had increased dark incontinent stools, weakness, and then falls.  Called PCP and was referred to the ER, where he was noted to be hyperkalemic.       Assessment & Plan:  Melena -Start IV PPI -Consult GI -Hold Plavix   Hyperkalemia ESRD -Consult Nephrology, appreciate cares  Cerebrovascular disease, secondary rprevention -Continue atorvastatin  Seizures No seizures -Continue Keppra  Mood disorder Dementia -Continue escitalopram  Diabetes well controlled with polyneuroapthy -Continue gabapentin  Hypothyroidisn -Continue levothyroxine  Pancreatic insufficiency -Continue supplements  Hypertension -Continue metoprolol       Disposition: Status is: Observation  The patient will require care spanning > 2 midnights and should be moved to inpatient because: Ongoing diagnostic testing needed not appropriate for outpatient work up  Dispo: The patient is from: Home              Anticipated d/c is to: Home              Anticipated d/c date is: 1 day              Patient currently is not medically stable to d/c.              MDM: The below labs and imaging reports were reviewed and summarized above.  Medication management as above.   DVT prophylaxis:   Code Status: FULL Family Communication: Wife by phone    Consultants:   GI  Nephrology          Subjective: Tired.  Still some dark stools.  No confusion, fever.  Dementia is at baseline.  No vomiting.  No new focal weakness.  Objective: Vitals:   03/16/20 0915 03/16/20 1002 03/16/20 1030 03/16/20 1200  BP:   (!) 147/69 (!) 155/89   Pulse: 82   87  Resp: 19  18 19   Temp:      TempSrc:      SpO2: 95%   96%  Weight:  79.8 kg    Height:  6' (1.829 m)     No intake or output data in the 24 hours ending 03/16/20 1512 Filed Weights   03/15/20 1648 03/15/20 1657 03/16/20 1002  Weight: 79.8 kg 79.8 kg 79.8 kg    Examination: General appearance:  adult male, alert and in no acute distress.   HEENT: Anicteric, conjunctiva pink, lids and lashes normal. No nasal deformity, discharge, epistaxis.  Lips moist.   Skin: Warm and dry.  no jaundice.  No suspicious rashes or lesions. Cardiac: RRR, nl S1-S2, no murmurs appreciated.  Capillary refill is brisk.  JVP normal  No LE edema.  Radial pulses 2+ and symmetric. Respiratory: Normal respiratory rate and rhythm.  CTAB without rales or wheezes. Abdomen: Abdomen soft.  no TTP. No ascites, distension, hepatosplenomegaly.   MSK: No deformities or effusions. Neuro: Awake and alert.  EOMI, moves all extremities, mild right hemiparesis. Speech fluent.    Psych: Sensorium intact and responding to questions, attention normal. Affect blunted.  Judgment and insight appear impaired.    Data Reviewed: I have personally reviewed following labs and imaging studies:  CBC: Recent Labs  Lab 03/15/20 1653 03/16/20  0520  WBC 7.2 5.7  NEUTROABS 5.3  --   HGB 10.8* 10.1*  HCT 35.5* 31.3*  MCV 101.1* 97.5  PLT 170 151*   Basic Metabolic Panel: Recent Labs  Lab 03/15/20 1653 03/15/20 2230 03/16/20 0241 03/16/20 0520 03/16/20 1202  NA 134*  --   --  137  --   K 6.1* 5.0 4.3 4.6 4.8  CL 93*  --   --  97*  --   CO2 20*  --   --  22  --   GLUCOSE 200*  --   --  105*  --   BUN 50*  --   --  53*  --   CREATININE 8.64*  --   --  9.06*  --   CALCIUM 6.9*  --   --  6.7*  --    GFR: Estimated Creatinine Clearance: 8.2 mL/min (A) (by C-G formula based on SCr of 9.06 mg/dL (H)). Liver Function Tests: No results for input(s): AST, ALT, ALKPHOS, BILITOT, PROT, ALBUMIN in the last 168  hours. No results for input(s): LIPASE, AMYLASE in the last 168 hours. No results for input(s): AMMONIA in the last 168 hours. Coagulation Profile: No results for input(s): INR, PROTIME in the last 168 hours. Cardiac Enzymes: No results for input(s): CKTOTAL, CKMB, CKMBINDEX, TROPONINI in the last 168 hours. BNP (last 3 results) No results for input(s): PROBNP in the last 8760 hours. HbA1C: No results for input(s): HGBA1C in the last 72 hours. CBG: Recent Labs  Lab 03/15/20 2109 03/15/20 2227  GLUCAP 120* 106*   Lipid Profile: No results for input(s): CHOL, HDL, LDLCALC, TRIG, CHOLHDL, LDLDIRECT in the last 72 hours. Thyroid Function Tests: No results for input(s): TSH, T4TOTAL, FREET4, T3FREE, THYROIDAB in the last 72 hours. Anemia Panel: No results for input(s): VITAMINB12, FOLATE, FERRITIN, TIBC, IRON, RETICCTPCT in the last 72 hours. Urine analysis:    Component Value Date/Time   COLORURINE YELLOW 03/22/2019 1622   APPEARANCEUR CLEAR 03/22/2019 1622   APPEARANCEUR Clear 08/08/2013 1947   LABSPEC 1.018 03/22/2019 1622   LABSPEC 1.012 08/08/2013 1947   PHURINE 5.0 03/22/2019 1622   GLUCOSEU >=500 (A) 03/22/2019 1622   GLUCOSEU 50 mg/dL 08/08/2013 1947   HGBUR NEGATIVE 03/22/2019 [place] 03/22/2019 1622   BILIRUBINUR Negative 08/08/2013 1947   KETONESUR NEGATIVE 03/22/2019 1622   PROTEINUR >=300 (A) 03/22/2019 1622   NITRITE NEGATIVE 03/22/2019 1622   LEUKOCYTESUR NEGATIVE 03/22/2019 1622   LEUKOCYTESUR Negative 08/08/2013 1947   Sepsis Labs: @LABRCNTIP (procalcitonin:4,lacticacidven:4)  ) Recent Results (from the past 240 hour(s))  SARS CORONAVIRUS 2 (TAT 6-24 HRS) Nasopharyngeal Nasopharyngeal Swab     Status: None   Collection Time: 03/15/20 10:41 PM   Specimen: Nasopharyngeal Swab  Result Value Ref Range Status   SARS Coronavirus 2 NEGATIVE NEGATIVE Final    Comment: (NOTE) SARS-CoV-2 target nucleic acids are NOT DETECTED.  The  SARS-CoV-2 RNA is generally detectable in upper and lower respiratory specimens during the acute phase of infection. Negative results do not preclude SARS-CoV-2 infection, do not rule out co-infections with other pathogens, and should not be used as the sole basis for treatment or other patient management decisions. Negative results must be combined with clinical observations, patient history, and epidemiological information. The expected result is Negative.  Fact Sheet for Patients: SugarRoll.be  Fact Sheet for Healthcare Providers: https://www.woods-mathews.com/  This test is not yet approved or cleared by the [place] FDA and  has been authorized for detection  and/or diagnosis of SARS-CoV-2 by FDA under an Emergency Use Authorization (EUA). This EUA will remain  in effect (meaning this test can be used) for the duration of the COVID-19 declaration under Se ction 564(b)(1) of the Act, 21 U.S.C. section 360bbb-3(b)(1), unless the authorization is terminated or revoked sooner.  Performed at [facility], [place] [address], [place], [place] [address]          Radiology Studies: DG Chest Portable 1 View  Result Date: 03/15/2020 CLINICAL DATA:  Altered mental status. EXAM: PORTABLE CHEST 1 VIEW COMPARISON:  January 25, 2020 FINDINGS: Chronic appearing increased lung markings, without evidence of acute infiltrate, pleural effusion or pneumothorax. The cardiac silhouette is moderately enlarged. Is marked severity calcification of the aortic arch. Chronic right-sided rib fractures are seen. Multilevel degenerative changes are noted throughout the thoracic spine. IMPRESSION: Stable cardiomegaly with chronic appearing increased lung markings. Electronically Signed   By: Virgina Norfolk M.D.   On: 03/15/2020 20:56        Scheduled Meds: . atorvastatin  10 mg Oral Daily  . calcium acetate  1,334 mg Oral TID WC  . [START ON  03/17/2020] escitalopram  10 mg Oral Daily  . gabapentin  100 mg Oral BID  . levETIRAcetam  500 mg Oral BID  . [START ON 03/17/2020] levothyroxine  200 mcg Oral QAC breakfast  . lipase/protease/amylase  12,000 Units Oral TID WC  . metoprolol succinate  50 mg Oral Daily  . pantoprazole  40 mg Oral Daily  . traZODone  100 mg Oral QHS   Continuous Infusions:   LOS: 0 days    Time spent: 25 minutes    Edwin Dada, MD [facility] 03/16/2020, 3:12 PM     Please page though [place] or Epic secure chat:  For [employer], Adult nurse

## 2020-03-16 NOTE — TOC Initial Note (Signed)
Transition of Care [facility]) - Initial/Assessment Note    Patient Details  Name: Kyle [name] MRN: [id number] Date of Birth: February 03, 1951  [facility]) CM/SW Contact:    Ova Freshwater Phone Number: [phone] 03/16/2020, 1:21 PM  Clinical Narrative:                   Patient presents to [facility] ED due to black stools and dizziness.  CSW spoke with the patient and the patient's Vancleve,[name] R (Spouse) [phone] (Mobile) for collateral information.  CSW explained the role of TOC in patient care.  Patient stated he lives at home with spouse.  Is able to perform some ADLs but needs assistance with bathing.  Patient uses a walker to ambulate.  Patient's spouse Ms/ Hudgins drives the patient to his doctor's appointments.  Ms. Courville stated the patient does not have trouble getting his medication but does not take his medications as prescribed and does not take his insulin daily. Patient goes to OP dialysis Monday, Wed, Friday. Ms. Jagiello stated "I let him handle his medications on his own. Patient's primary is Dr[facility].     PT/OT have recommended home health and in addition RN, and home health aide have been ordered by Attending.  CSW has reached out to [place] at  [facility], pending reply.  Expected Discharge Plan: [place]     Patient Goals and CMS Choice        Expected Discharge Plan and Services Expected Discharge Plan: [place]                                              Prior Living Arrangements/Services                       Activities of Daily Living      Permission Sought/Granted                  Emotional Assessment              Admission diagnosis:  Hyperkalemia [E87.5] Patient Active Problem List   Diagnosis Date Noted  . Chronic diastolic CHF (congestive heart failure) ([place]) 03/16/2020  . Hyperkalemia 03/15/2020  . Seizure ([place]) 01/25/2020  .  Depression   . Acute GI bleeding 07/28/2019  . Melena 07/22/2019  . History of GI bleed 07/22/2019  . Current every day smoker 06/22/2019  . Falls frequently 06/22/2019  . Palliative care by specialist   . Goals of care, counseling/discussion   . Orthostasis 05/02/2019  . ESRD (end stage renal disease) on dialysis ([place])   . Acute blood loss anemia 04/30/2019  . Acute on chronic blood loss anemia 04/29/2019  . History of CVA (cerebrovascular accident) 04/29/2019  . GI bleed 04/04/2019  . 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[place]) 03/20/2019  . Generalized weakness   . Diabetic polyneuropathy associated with type 2 diabetes mellitus ([place])   . Cerebrovascular accident (CVA) ([place])   . PAD (peripheral artery disease) ([place]) 03/17/2019  . Ptosis of eyelid, left 03/15/2019  . Physical deconditioning 03/14/2019  . Hypomagnesemia 03/13/2019  . Hyperphosphatemia 03/13/2019  . ESRD (end stage renal disease) ([place]) 03/13/2019  . Chronic kidney disease with peritoneal dialysis as preferred modality, stage 5 ([place]) 03/13/2019  . Prolonged QT interval 03/13/2019  . Hypertension  associated with diabetes ([place]) 03/13/2019  . Hypothyroidism 03/13/2019  . Chronic diarrhea 03/13/2019  . Hypocalcemia 03/12/2019  . Ataxia 03/10/2019  . Dizziness 03/10/2019  . Vision problem 03/10/2019  . Hypokalemia 09/16/2018  . ESRD on peritoneal dialysis ([place]) 03/04/2018  . Lung nodule 06/12/2016  . Narrowing of airway   . Cigarette smoker 05/24/2016  . Collapse of right lung 05/24/2016  . COPD with chronic bronchitis ([place]) 05/24/2016  . Diarrhea due to malabsorption 07/01/2015  . Hx of adenomatous colonic polyps 07/01/2015  . Acne 05/20/2015  . Calculus of kidney 05/20/2015  . Chest pain, non-cardiac 05/20/2015  . Seizure disorder ([place]) 05/20/2015  . Current tobacco use 05/20/2015  . Chronic kidney disease (CKD), stage III (moderate) ([place]) 03/29/2015  . Insulin dependent type 2 diabetes mellitus ([place]) 03/15/2015  . Lipoma  of shoulder 03/08/2014  . Other synovitis and tenosynovitis, right shoulder 03/08/2014  . Right supraspinatus tenosynovitis 03/08/2014  . Bursitis of elbow 02/15/2014  . Olecranon bursitis of right elbow 02/15/2014  . Absolute anemia 08/08/2013  . 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 Back pain, chronic 08/08/2013  . Chronic obstructive pulmonary disease ([place]) 08/08/2013  . BP (high blood pressure) 08/08/2013  . Hyperlipidemia associated with type 2 diabetes mellitus ([place]) 08/08/2013  . Acne erythematosa 08/08/2013   PCP:  Idelle Crouch, MD Pharmacy:   [facility] [address] [place] [address] Phone: [phone] Fax: [place], [employer] [address] [place] [address] Phone: [phone] Fax: [place], [place] [address] Phone: [phone] Fax: [phone]     Social Determinants of Health (SDOH) Interventions    Readmission Risk Interventions Readmission Risk Prevention Plan 01/26/2020  Transportation Screening Complete  Medication Review ([place]) Complete  PCP or Specialist appointment within 3-5 days of discharge Complete  HRI or Home Care Consult Complete  SW Recovery Care/Counseling Consult Complete  [place] Complete  Some recent data might be hidden

## 2020-03-16 NOTE — ED Notes (Signed)
Pt given meal tray at this time 

## 2020-03-16 NOTE — ED Notes (Signed)
Pt being transported at this time to dialysis

## 2020-03-16 NOTE — Evaluation (Signed)
Occupational Therapy Evaluation Patient Details Name: Kyle [name] MRN: [id number] DOB: Jun 17, 1950 Today's Date: 03/16/2020    History of Present Illness Pt is a 70 y.o. 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   Clinical Impression   Patient presenting with decreased I in self care, balance, functional transfers/endurance, endurance, and safety awareness.  Patient reports living at home in town house with 5 STE with wife PTA. Pt reports being mod I with use of RW. He has an aide that "just watches me while I wash" and reports HHPT seeing him as well. Patient currently functioning at min guard without use of RW for self care and functional transfers. Pt would likely need less assistance with use of RW. Pt reports feeling close to his baseline but does fatigue quickly. OT discussed need for 3 in 1 [facility] for energy conservation with pt verbalizing understanding and agreement.  Patient will benefit from acute OT to increase overall independence in the areas of ADLs, functional mobility, and safety awareness in order to safely discharge home with wife.    Follow Up Recommendations  Home health OT;Supervision/Assistance - 24 hour    Equipment Recommendations  3 in 1 bedside commode       Precautions / Restrictions Precautions Precautions: Fall Precaution Comments: dizziness Restrictions Weight Bearing Restrictions: No      Mobility Bed Mobility Overal bed mobility: Needs Assistance Bed Mobility: Supine to Sit;Sit to Supine     Supine to sit: Supervision;HOB elevated Sit to supine: HOB elevated;Supervision        Transfers Overall transfer level: Needs assistance Equipment used: None Transfers: Sit to/from [facility] Sit to Stand: Min guard Stand pivot  transfers: Min guard       General transfer comment: min guard without use of AD    Balance Overall balance assessment: Needs assistance Sitting-balance support: Feet supported Sitting balance-Leahy Scale: Good     Standing balance support: During functional activity Standing balance-Leahy Scale: Fair Standing balance comment: safety improved with RW                           ADL either performed or assessed with clinical judgement   ADL Overall ADL's : Needs assistance/impaired                         Toilet Transfer: Min Art therapist Details (indicate cue type and reason): min guard without use of RW this session                 Vision Patient Visual Report: No change from baseline              Pertinent Vitals/Pain Pain Assessment: Faces Faces Pain Scale: Hurts little more Pain Location: buttocks, pt references hemmorhoids Pain Descriptors / Indicators: Grimacing Pain Intervention(s): Limited activity within patient's tolerance;Monitored during session     Hand Dominance Right   Extremity/Trunk Assessment Upper Extremity Assessment Upper Extremity Assessment: Overall WFL for tasks assessed   Lower Extremity Assessment Lower Extremity Assessment: Defer to PT evaluation RLE Deficits / Details: grossly 4+/5 LLE Deficits / Details: grossly 4+/5   Cervical / Trunk Assessment Cervical / Trunk Assessment: Normal   Communication Communication Communication: No difficulties   Cognition Arousal/Alertness: Awake/alert Behavior During Therapy:  WFL for tasks assessed/performed Overall Cognitive Status: No family/caregiver present to determine baseline cognitive functioning                                 General Comments: pt oriented to self, place, situation              Home Living Family/patient expects to be discharged to:: Private residence Living Arrangements: Spouse/significant other Available  Help at Discharge: Family;Available 24 hours/day Type of Home: House Home Access: Stairs to enter [employer] of Steps: 6 Entrance Stairs-Rails: Right Home Layout: Two level     Bathroom Shower/Tub: Occupational psychologist: Handicapped height Bathroom Accessibility: Yes   Home Equipment: Environmental consultant - 2 wheels;Cane - single point;Shower seat          Prior Functioning/Environment Level of Independence: Needs assistance  Gait / Transfers Assistance Needed: RW at baseline for ambulation ADL's / Homemaking Assistance Needed: Pt stated he does not normally need assistance. Pt does have an aide that does not provide physical assistance but "just watches me"   Comments: Per OT, pt reported 5-6 falls in the last 6 months, stated he "falls all the time"        OT Problem List: Decreased strength;Decreased knowledge of use of DME or AE;Decreased activity tolerance;Impaired balance (sitting and/or standing);Decreased safety awareness      OT Treatment/Interventions: Self-care/ADL training;Therapeutic exercise;Patient/family education;Balance training;Energy conservation;Therapeutic activities;DME and/or AE instruction;Modalities;Manual therapy    OT Goals(Current goals can be found in the care plan section) Acute Rehab OT Goals Patient Stated Goal: to decrease his pain OT Goal Formulation: With patient Time For Goal Achievement: 03/30/20 Potential to Achieve Goals: Good ADL Goals Pt Will Perform Grooming: with supervision;standing Pt Will Perform Lower Body Dressing: with supervision;sit to/from stand Pt Will Transfer to Toilet: with supervision;ambulating Pt Will Perform Toileting - Clothing Manipulation and hygiene: with supervision;sit to/from stand  OT Frequency: Min 2X/week   Barriers to D/C:    none known at this time          AM-PAC OT "6 Clicks" Daily Activity     Outcome Measure Help from another person eating meals?: None Help from another person  taking care of personal grooming?: A Little Help from another person toileting, which includes using toliet, bedpan, or urinal?: A Little Help from another person bathing (including washing, rinsing, drying)?: A Little Help from another person to put on and taking off regular upper body clothing?: None Help from another person to put on and taking off regular lower body clothing?: A Little 6 Click Score: 20   End of Session Nurse Communication: Mobility status;Other (comment) (pukse ox not reading)  Activity Tolerance: Patient tolerated treatment well Patient left: in bed;with call bell/phone within reach;with bed alarm set  OT Visit Diagnosis: Unsteadiness on feet (R26.81);Repeated falls (R29.6);Muscle weakness (generalized) (M62.81);History of falling (Z91.81)                Time: 5726-2035 OT Time Calculation (min): 26 min Charges:  OT General Charges $OT Visit: 1 Visit OT Evaluation $OT Eval Low Complexity: 1 Low OT Treatments $Self Care/Home Management : 8-22 mins  Darleen Crocker, MS, OTR/L , CBIS ascom [phone]  03/16/20, 12:49 PM

## 2020-03-16 NOTE — Progress Notes (Signed)
[facility]  ROUNDING NOTE   Subjective:  Patient seen and evaluated at bedside. Missed dialysis earlier in the week due to the weather. Came in with generalized weakness.   Objective:  Vital signs in last 24 hours:  Temp:  [98.1 F (36.7 C)] 98.1 F (36.7 C) (01/18 1648) Pulse Rate:  [62-87] 87 (01/19 1200) Resp:  [13-21] 19 (01/19 1200) BP: (130-175)/(68-117) 155/89 (01/19 1200) SpO2:  [92 %-100 %] 96 % (01/19 1200) Weight:  [79.8 kg] 79.8 kg (01/19 1002)  Weight change:  Filed Weights   03/15/20 1648 03/15/20 1657 03/16/20 1002  Weight: 79.8 kg 79.8 kg 79.8 kg    Intake/Output: No intake/output data recorded.   Intake/Output this shift:  No intake/output data recorded.  Physical Exam: General:  No acute distress  Head:  Normocephalic, atraumatic. Moist oral mucosal membranes  Eyes:  Anicteric  Neck:  Supple  Lungs:   Clear to auscultation, normal effort  Heart:  S1S2 no rubs  Abdomen:   Soft, nontender, bowel sounds present  Extremities:  No peripheral edema.  Neurologic:  Awake, alert, following commands  Skin:  No lesions  Access:  Left upper extremity AV fistula    Basic Metabolic Panel: Recent Labs  Lab 03/15/20 1653 03/15/20 2230 03/16/20 0241 03/16/20 0520 03/16/20 1202  NA 134*  --   --  137  --   K 6.1* 5.0 4.3 4.6 4.8  CL 93*  --   --  97*  --   CO2 20*  --   --  22  --   GLUCOSE 200*  --   --  105*  --   BUN 50*  --   --  53*  --   CREATININE 8.64*  --   --  9.06*  --   CALCIUM 6.9*  --   --  6.7*  --     Liver Function Tests: No results for input(s): AST, ALT, ALKPHOS, BILITOT, PROT, ALBUMIN in the last 168 hours. No results for input(s): LIPASE, AMYLASE in the last 168 hours. No results for input(s): AMMONIA in the last 168 hours.  CBC: Recent Labs  Lab 03/15/20 1653 03/16/20 0520  WBC 7.2 5.7  NEUTROABS 5.3  --   HGB 10.8* 10.1*  HCT 35.5* 31.3*  MCV 101.1* 97.5  PLT 170 144*    Cardiac Enzymes: No  results for input(s): CKTOTAL, CKMB, CKMBINDEX, TROPONINI in the last 168 hours.  BNP: Invalid input(s): POCBNP  CBG: Recent Labs  Lab 03/15/20 2109 03/15/20 2227  GLUCAP 120* 106*    Microbiology: Results for orders placed or performed during the hospital encounter of 03/15/20  SARS CORONAVIRUS 2 (TAT 6-24 HRS) Nasopharyngeal Nasopharyngeal Swab     Status: None   Collection Time: 03/15/20 10:41 PM   Specimen: Nasopharyngeal Swab  Result Value Ref Range Status   SARS Coronavirus 2 NEGATIVE NEGATIVE Final    Comment: (NOTE) SARS-CoV-2 target nucleic acids are NOT DETECTED.  The SARS-CoV-2 RNA is generally detectable in upper and lower respiratory specimens during the acute phase of infection. Negative results do not preclude SARS-CoV-2 infection, do not rule out co-infections with other pathogens, and should not be used as the sole basis for treatment or other patient management decisions. Negative results must be combined with clinical observations, patient history, and epidemiological information. The expected result is Negative.  Fact Sheet for Patients: SugarRoll.be  Fact Sheet for Healthcare Providers: https://www.woods-mathews.com/  This test is not yet approved or cleared by the  [place] Architectural technologist and  has been authorized for detection and/or diagnosis of SARS-CoV-2 by FDA under an Print production planner (EUA). This EUA will remain  in effect (meaning this test can be used) for the duration of the COVID-19 declaration under Se ction 564(b)(1) of the Act, 21 U.S.C. section 360bbb-3(b)(1), unless the authorization is terminated or revoked sooner.  Performed at [facility], [place] [address], [place], [place] [address]     Coagulation Studies: No results for input(s): LABPROT, INR in the last 72 hours.  Urinalysis: No results for input(s): COLORURINE, LABSPEC, PHURINE, GLUCOSEU, HGBUR, BILIRUBINUR, KETONESUR,  PROTEINUR, UROBILINOGEN, NITRITE, LEUKOCYTESUR in the last 72 hours.  Invalid input(s): APPERANCEUR    Imaging: DG Chest Portable 1 View  Result Date: 03/15/2020 CLINICAL DATA:  Altered mental status. EXAM: PORTABLE CHEST 1 VIEW COMPARISON:  January 25, 2020 FINDINGS: Chronic appearing increased lung markings, without evidence of acute infiltrate, pleural effusion or pneumothorax. The cardiac silhouette is moderately enlarged. Is marked severity calcification of the aortic arch. Chronic right-sided rib fractures are seen. Multilevel degenerative changes are noted throughout the thoracic spine. IMPRESSION: Stable cardiomegaly with chronic appearing increased lung markings. Electronically Signed   By: Virgina Norfolk M.D.   On: 03/15/2020 20:56     Medications:    . atorvastatin  10 mg Oral Daily  . calcium acetate  1,334 mg Oral TID WC  . [START ON 03/17/2020] escitalopram  10 mg Oral Daily  . gabapentin  100 mg Oral BID  . levETIRAcetam  500 mg Oral BID  . [START ON 03/17/2020] levothyroxine  200 mcg Oral QAC breakfast  . lipase/protease/amylase  12,000 Units Oral TID WC  . metoprolol succinate  50 mg Oral Daily  . pantoprazole  40 mg Oral Daily  . traZODone  100 mg Oral QHS     Assessment/ Plan:  70 y.o. male past medical history of CVA with residual left-sided weakness, ESRD on HD MWF, seizure disorder, diabetes mellitus, hypertension, anemia of chronic kidney disease, secondary hyperparathyroidism who was admitted with generalized weakness after missing hemodialysis treatment as an patient.  1.  ESRD on HD MWF.  Patient missed dialysis on Monday.  He is due for hemodialysis treatment today.  Orders have been prepared.  2.  Anemia of chronic kidney disease.  Hemoglobin at target at 10.1.  Resume Epogen as an outpatient.  3.  Secondary hyperparathyroidism.  Continue to monitor bone metabolism parameters.  Continue calcium acetate 2 tablets p.o. 3 times daily with meals.  4.   Hypertension.  Blood pressure currently 155/89.  Maintain the patient on metoprolol at this time.   LOS: 0 Marlee Armenteros 1/19/20221:33 PM

## 2020-03-16 NOTE — ED Notes (Signed)
Pt returned to ED from dialysis 

## 2020-03-17 ENCOUNTER — Observation Stay: Payer: Medicare PPO | Admitting: Certified Registered Nurse Anesthetist

## 2020-03-17 ENCOUNTER — Encounter: Payer: Self-pay | Admitting: Family Medicine

## 2020-03-17 ENCOUNTER — Other Ambulatory Visit: Payer: Self-pay

## 2020-03-17 ENCOUNTER — Encounter
Admission: EM | Disposition: A | Discharge status: Home / Self Care | Payer: Self-pay | Source: Home / Self Care | Attending: Emergency Medicine

## 2020-03-17 DIAGNOSIS — E875 Hyperkalemia: Secondary | ICD-10-CM | POA: Diagnosis not present

## 2020-03-17 HISTORY — PX: ESOPHAGOGASTRODUODENOSCOPY: SHX5428

## 2020-03-17 LAB — COMPREHENSIVE METABOLIC PANEL
ALT: 30 U/L (ref 0–44)
AST: 35 U/L (ref 15–41)
Albumin: 3.4 g/dL — ABNORMAL LOW (ref 3.5–5.0)
Alkaline Phosphatase: 110 U/L (ref 38–126)
Anion gap: 13 (ref 5–15)
BUN: 32 mg/dL — ABNORMAL HIGH (ref 8–23)
CO2: 28 mmol/L (ref 22–32)
Calcium: 7.8 mg/dL — ABNORMAL LOW (ref 8.9–10.3)
Chloride: 96 mmol/L — ABNORMAL LOW (ref 98–111)
Creatinine, Ser: 6.2 mg/dL — ABNORMAL HIGH (ref 0.61–1.24)
GFR, Estimated: 9 mL/min — ABNORMAL LOW (ref >60)
Glucose, Bld: 123 mg/dL — ABNORMAL HIGH (ref 70–99)
Potassium: 4 mmol/L (ref 3.5–5.1)
Sodium: 137 mmol/L (ref 135–145)
Total Bilirubin: 1 mg/dL (ref 0.3–1.2)
Total Protein: 6.8 g/dL (ref 6.5–8.1)

## 2020-03-17 LAB — CBC
HCT: 34.7 % — ABNORMAL LOW (ref 39.0–52.0)
Hemoglobin: 11 g/dL — ABNORMAL LOW (ref 13.0–17.0)
MCH: 31.1 pg (ref 26.0–34.0)
MCHC: 31.7 g/dL (ref 30.0–36.0)
MCV: 98 fL (ref 80.0–100.0)
Platelets: 151 10*3/uL (ref 150–400)
RBC: 3.54 MIL/uL — ABNORMAL LOW (ref 4.22–5.81)
RDW: 14.5 % (ref 11.5–15.5)
WBC: 4.8 10*3/uL (ref 4.0–10.5)
nRBC: 0 % (ref 0.0–0.2)

## 2020-03-17 LAB — GLUCOSE, CAPILLARY: Glucose-Capillary: 120 mg/dL — ABNORMAL HIGH (ref 70–99)

## 2020-03-17 LAB — CK: Total CK: 95 U/L (ref 49–397)

## 2020-03-17 SURGERY — EGD (ESOPHAGOGASTRODUODENOSCOPY)
Anesthesia: General

## 2020-03-17 SURGERY — ESOPHAGOGASTRODUODENOSCOPY (EGD) WITH PROPOFOL
Anesthesia: Monitor Anesthesia Care

## 2020-03-17 MED ORDER — LIDOCAINE HCL (CARDIAC) PF 100 MG/5ML IV SOSY
PREFILLED_SYRINGE | INTRAVENOUS | Status: DC | PRN
  Administered 2020-03-17: 100 mg via INTRAVENOUS

## 2020-03-17 MED ORDER — SODIUM CHLORIDE 0.9 % IV SOLN: INTRAVENOUS | Status: DC | PRN

## 2020-03-17 MED ORDER — PROPOFOL 10 MG/ML IV BOLUS
INTRAVENOUS | Status: DC | PRN
  Administered 2020-03-17: 50 mg via INTRAVENOUS

## 2020-03-17 MED ORDER — PROPOFOL 500 MG/50ML IV EMUL
INTRAVENOUS | Status: DC | PRN
  Administered 2020-03-17: 140 ug/kg/min via INTRAVENOUS

## 2020-03-17 MED ORDER — SODIUM CHLORIDE 0.9 % IV SOLN: INTRAVENOUS | Status: DC

## 2020-03-17 NOTE — Discharge Summary (Signed)
Physician Discharge Summary  Kyle [name] CZY:[id number] DOB: 1950/04/10 DOA: 03/15/2020  PCP: Idelle Crouch, MD  Admit date: 03/15/2020 Discharge date: 03/17/2020  Admitted From: Home with [place] Disposition:  Home with [facility]   Recommendations for Outpatient Follow-up:  1. Follow up with PCP Dr. Doy Hutching in 1 week 2. Follow up with GI Dr. Haig Prophet in 4-6 weeks 3. Dr. Doy Hutching: Please obtain US abdomen to evaluate for cirrhosis 4. Please obtain BMP/CBC in one week 5. Please follow up on the following pending results:  Home Health: PT/OT/SW due to dementia  Equipment/Devices: None new  Discharge Condition: Fair  CODE STATUS: FULL Diet recommendation: Cardiac  Brief/Interim Summary: Kyle [name] is a 70 y.o. M with CVA and seizures, dementia, homedwelling, right hemiparesis mild, ESRD and DM who presented with melena.  Evidently, over the last 2-3 days, patient has had increased dark incontinent stools, weakness, and then falls.  Called PCP and was referred to the ER, where he was noted to be hyperkalemic.       PRINCIPAL HOSPITAL DIAGNOSIS: Acute upper GI bleed    Discharge Diagnoses:   Melena Upper GI bleed due to gastritis Patient admitted and observed overnight.  Had frequent BMs, but melena cleared.  Hgb remained stable.  Started on IV PPI and GI consulted.  They performed endoscopy that showed severe gastropathy and also erosive duodenopathy without ulcer, active bleeding or stigmata of recent bleeding.  Biopsy taken.  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    Okay to resume Plavix tomorrow.     Hyperkalemia ESRD Admitted, given Lokelma, K normalized.  Underwent routine HD.  Cerebrovascular disease, secondary prevention Continue atorvastatin, resume Plavix.  Seizures No seizures in the hospital.  Stable on Keppra.   Mood disorder Dementia  Diabetes well  controlled with polyneuroapthy  Hypothyroidism  Pancreatic insufficiency  Hypertension           Discharge Instructions  Discharge Instructions    Discharge instructions   Complete by: As directed    From Dr. Loleta Books: You were admitted for black stools (also known as melena)  This is sometimes a sign of bleeding from the intestinal tract.  Thankfully, this appears to have resolved.  When Dr. Haig Prophet did your endoscopy, he found severe irritation in the stomach and first part of the small intestine (duodenum).  These are common sites of bleeding.  Thankfully, he found nothing actively bleeding and nothing high risk for future bleeding.    You should make SURE to take an acid suppressing medicine  Take pantoprazole/Protonix 40 mg daily  You may resume Plavix starting tomorrow  Call Dr. Drinda Butts office for follow up  You should see them in 4-6 weeks They will evaluate your liver with an ultrasound to make sure this isn't contributing to your stomach situation.   Increase activity slowly   Complete by: As directed      Allergies as of 03/17/2020      Reactions   Vimpat [lacosamide] Other (See Comments)   Reports causes seizures      Medication List    TAKE these medications   atorvastatin 10 MG tablet Commonly known as: LIPITOR Take 10 mg by mouth daily.   calcium acetate 667 MG capsule Commonly known as: PHOSLO Take 2 capsules (1,334 mg total) by mouth 3 (three) times daily with meals.   clopidogrel 75 MG tablet Commonly known as: PLAVIX Take 75 mg by  mouth daily.   escitalopram 10 MG tablet Commonly known as: LEXAPRO Take 10 mg by mouth daily.   gabapentin 100 MG capsule Commonly known as: NEURONTIN Take 1 capsule (100 mg total) by mouth 2 (two) times daily.   levETIRAcetam 500 MG tablet Commonly known as: KEPPRA Take 500 mg by mouth 2 (two) times daily.   levothyroxine 200 MCG tablet Commonly known as: SYNTHROID Take 200 mcg by mouth  daily before breakfast.   lidocaine-prilocaine cream Commonly known as: EMLA Apply 1 application topically Every Tuesday,Thursday,and Saturday with dialysis.   metoprolol succinate 50 MG 24 hr tablet Commonly known as: TOPROL-XL Take 1 tablet by mouth daily.   NEPHRO-VITE PO Take 1 tablet by mouth daily.   Pancrelipase (Lip-Prot-Amyl) 24000-76000 units Cpep Take 1 capsule by mouth 3 (three) times daily with meals.   pantoprazole 40 MG tablet Commonly known as: PROTONIX Take 40 mg by mouth daily.   traZODone 100 MG tablet Commonly known as: DESYREL Take 100 mg by mouth at bedtime.            Durable Medical Equipment  (From admission, onward)         Start     Ordered   03/16/20 1153  DME 3-in-1  (Discharge Planning)  Once        03/16/20 1152          Allergies  Allergen Reactions  . Vimpat [Lacosamide] Other (See Comments)    Reports causes seizures    Consultations:  Gastroenterology   Procedures/Studies: DG Chest Portable 1 View  Result Date: 03/15/2020 CLINICAL DATA:  Altered mental status. EXAM: PORTABLE CHEST 1 VIEW COMPARISON:  January 25, 2020 FINDINGS: Chronic appearing increased lung markings, without evidence of acute infiltrate, pleural effusion or pneumothorax. The cardiac silhouette is moderately enlarged. Is marked severity calcification of the aortic arch. Chronic right-sided rib fractures are seen. Multilevel degenerative changes are noted throughout the thoracic spine. IMPRESSION: Stable cardiomegaly with chronic appearing increased lung markings. Electronically Signed   By: Virgina Norfolk M.D.   On: 03/15/2020 20:56       Subjective: Feeling well.  No more melena.  One stool overnight, no incontinence.  No confusion, weakness, fever.  Discharge Exam: Vitals:   03/17/20 1520 03/17/20 1530  BP:  (!) 125/58  Pulse: 68 73  Resp: 10 18  Temp:    SpO2: 99% 99%   Vitals:   03/17/20 1500 03/17/20 1511 03/17/20 1520 03/17/20  1530  BP:  (!) 82/18  (!) 125/58  Pulse: 68  68 73  Resp: 19  10 18   Temp:      TempSrc:      SpO2: 100%  99% 99%  Weight:      Height:        General: Pt is alert, awake, not in acute distress, lying in bed. Cardiovascular: RRR, nl S1-S2, no murmurs appreciated.   No LE edema.   Respiratory: Normal respiratory rate and rhythm.  CTAB without rales or wheezes. Abdominal: Abdomen soft and non-tender.  No distension or HSM.   Neuro/Psych: Strength symmetric in upper and lower extremities.  Judgment and insight appear normal.   The results of significant diagnostics from this hospitalization (including imaging, microbiology, ancillary and laboratory) are listed below for reference.     Microbiology: Recent Results (from the past 240 hour(s))  SARS CORONAVIRUS 2 (TAT 6-24 HRS) Nasopharyngeal Nasopharyngeal Swab     Status: None   Collection Time: 03/15/20 10:41 PM   Specimen:  Nasopharyngeal Swab  Result Value Ref Range Status   SARS Coronavirus 2 NEGATIVE NEGATIVE Final    Comment: (NOTE) SARS-CoV-2 target nucleic acids are NOT DETECTED.  The SARS-CoV-2 RNA is generally detectable in upper and lower respiratory specimens during the acute phase of infection. Negative results do not preclude SARS-CoV-2 infection, do not rule out co-infections with other pathogens, and should not be used as the sole basis for treatment or other patient management decisions. Negative results must be combined with clinical observations, patient history, and epidemiological information. The expected result is Negative.  Fact Sheet for Patients: SugarRoll.be  Fact Sheet for Healthcare Providers: https://www.woods-mathews.com/  This test is not yet approved or cleared by the [place] FDA and  has been authorized for detection and/or diagnosis of SARS-CoV-2 by FDA under an Emergency Use Authorization (EUA). This EUA will remain  in effect (meaning this  test can be used) for the duration of the COVID-19 declaration under Se ction 564(b)(1) of the Act, 21 U.S.C. section 360bbb-3(b)(1), unless the authorization is terminated or revoked sooner.  Performed at [facility], [place] [address] [place], [place] [address]      Labs: BNP (last 3 results) Recent Labs    01/25/20 0816  BNP 1,093.2*   Basic Metabolic Panel: Recent Labs  Lab 03/15/20 1653 03/15/20 2230 03/16/20 0241 03/16/20 0520 03/16/20 1202 03/16/20 2117 03/17/20 0340  NA 134*  --   --  137  --   --  137  K 6.1* 5.0 4.3 4.6 4.8  --  4.0  CL 93*  --   --  97*  --   --  96*  CO2 20*  --   --  22  --   --  28  GLUCOSE 200*  --   --  105*  --   --  123*  BUN 50*  --   --  53*  --   --  32*  CREATININE 8.64*  --   --  9.06*  --   --  6.20*  CALCIUM 6.9*  --   --  6.7*  --   --  7.8*  PHOS  --   --   --   --   --  4.8*  --    Liver Function Tests: Recent Labs  Lab 03/17/20 0340  AST 35  ALT 30  ALKPHOS 110  BILITOT 1.0  PROT 6.8  ALBUMIN 3.4*   No results for input(s): LIPASE, AMYLASE in the last 168 hours. No results for input(s): AMMONIA in the last 168 hours. CBC: Recent Labs  Lab 03/15/20 1653 03/16/20 0520 03/17/20 0340  WBC 7.2 5.7 4.8  NEUTROABS 5.3  --   --   HGB 10.8* 10.1* 11.0*  HCT 35.5* 31.3* 34.7*  MCV 101.1* 97.5 98.0  PLT 170 144* 151   Cardiac Enzymes: Recent Labs  Lab 03/17/20 0340  CKTOTAL 95   BNP: Invalid input(s): POCBNP CBG: Recent Labs  Lab 03/15/20 2109 03/15/20 2227 03/17/20 1213  GLUCAP 120* 106* 120*   D-Dimer No results for input(s): DDIMER in the last 72 hours. Hgb A1c No results for input(s): HGBA1C in the last 72 hours. Lipid Profile No results for input(s): CHOL, HDL, LDLCALC, TRIG, CHOLHDL, LDLDIRECT in the last 72 hours. Thyroid function studies No results for input(s): TSH, T4TOTAL, T3FREE, THYROIDAB in the last 72 hours.  Invalid input(s): FREET3 Anemia work up No results for input(s):  VITAMINB12, FOLATE, FERRITIN, TIBC, IRON, RETICCTPCT in the  last 72 hours. Urinalysis    Component Value Date/Time   COLORURINE YELLOW 03/22/2019 1622   APPEARANCEUR CLEAR 03/22/2019 1622   APPEARANCEUR Clear 08/08/2013 1947   LABSPEC 1.018 03/22/2019 1622   LABSPEC 1.012 08/08/2013 1947   PHURINE 5.0 03/22/2019 1622   GLUCOSEU >=500 (A) 03/22/2019 1622   GLUCOSEU 50 mg/dL 08/08/2013 1947   HGBUR NEGATIVE 03/22/2019 1622   BILIRUBINUR NEGATIVE 03/22/2019 1622   BILIRUBINUR Negative 08/08/2013 1947   KETONESUR NEGATIVE 03/22/2019 1622   PROTEINUR >=300 (A) 03/22/2019 1622   NITRITE NEGATIVE 03/22/2019 1622   LEUKOCYTESUR NEGATIVE 03/22/2019 1622   LEUKOCYTESUR Negative 08/08/2013 1947   Sepsis Labs Invalid input(s): PROCALCITONIN,  WBC,  LACTICIDVEN Microbiology Recent Results (from the past 240 hour(s))  SARS CORONAVIRUS 2 (TAT 6-24 HRS) Nasopharyngeal Nasopharyngeal Swab     Status: None   Collection Time: 03/15/20 10:41 PM   Specimen: Nasopharyngeal Swab  Result Value Ref Range Status   SARS Coronavirus 2 NEGATIVE NEGATIVE Final    Comment: (NOTE) SARS-CoV-2 target nucleic acids are NOT DETECTED.  The SARS-CoV-2 RNA is generally detectable in upper and lower respiratory specimens during the acute phase of infection. Negative results do not preclude SARS-CoV-2 infection, do not rule out co-infections with other pathogens, and should not be used as the sole basis for treatment or other patient management decisions. Negative results must be combined with clinical observations, patient history, and epidemiological information. The expected result is Negative.  Fact Sheet for Patients: SugarRoll.be  Fact Sheet for Healthcare Providers: https://www.woods-mathews.com/  This test is not yet approved or cleared by the [place] FDA and  has been authorized for detection and/or diagnosis of SARS-CoV-2 by FDA under an Emergency Use  Authorization (EUA). This EUA will remain  in effect (meaning this test can be used) for the duration of the COVID-19 declaration under Se ction 564(b)(1) of the Act, 21 U.S.C. section 360bbb-3(b)(1), unless the authorization is terminated or revoked sooner.  Performed at [facility], [place] [address] [place], [place] [address]      Time coordinating discharge: 25 minutes The [place] controlled substances registry was reviewed for this patient     SIGNED:   Edwin Dada, MD  [facility] 03/17/2020, 3:54 PM

## 2020-03-17 NOTE — Op Note (Addendum)
[facility] Gastroenterology Patient Name: Kyle [name] Procedure Date: 03/17/2020 1:55 PM MRN: [id number] Account #: [id number] Date of Birth: 08-11-1950 Admit Type: Inpatient Age: 70 Room: [facility] ENDO ROOM 3 Gender: Male Note Status: Finalized Procedure:             Upper GI endoscopy Indications:           Melena Providers:             Andrey Farmer MD, MD Referring MD:          Leonie Douglas. Doy Hutching, MD (Referring MD) Medicines:             Monitored Anesthesia Care Complications:         No immediate complications. Estimated blood loss:                         Minimal. Procedure:             Pre-Anesthesia Assessment:                        - Prior to the procedure, a History and Physical was                         performed, and patient medications and allergies were                         reviewed. The patient is competent. The risks and                         benefits of the procedure and the sedation options and                         risks were discussed with the patient. All questions                         were answered and informed consent was obtained.                         Patient identification and proposed procedure were                         verified by the physician, the nurse, the anesthetist                         and the technician in the endoscopy suite. Mental                         Status Examination: alert and oriented. Airway                         Examination: normal oropharyngeal airway and neck                         mobility. Respiratory Examination: clear to                         auscultation. CV Examination: normal. Prophylactic                         Antibiotics:  The patient does not require prophylactic                         antibiotics. Prior Anticoagulants: The patient has                         taken Plavix (clopidogrel), last dose was 3 days prior                         to procedure. ASA Grade Assessment:  III - A patient                         with severe systemic disease. After reviewing the                         risks and benefits, the patient was deemed in                         satisfactory condition to undergo the procedure. The                         anesthesia plan was to use monitored anesthesia care                         (MAC). Immediately prior to administration of                         medications, the patient was re-assessed for adequacy                         to receive sedatives. The heart rate, respiratory                         rate, oxygen saturations, blood pressure, adequacy of                         pulmonary ventilation, and response to care were                         monitored throughout the procedure. The physical                         status of the patient was re-assessed after the                         procedure.                        After obtaining informed consent, the endoscope was                         passed under direct vision. Throughout the procedure,                         the patient's blood pressure, pulse, and oxygen                         saturations were monitored continuously. The Endoscope  was introduced through the mouth, and advanced to the                         second part of duodenum. The upper GI endoscopy was                         accomplished without difficulty. The patient tolerated                         the procedure well. Findings:      A non-obstructing Schatzki ring was found at the lower esophageal       sphincter.      Diffuse severe inflammation characterized by erythema was found in the       entire examined stomach. Almost appeared as portal hypertensive       gastropathy.      Diffuse severely erythematous mucosa without active bleeding and with no       stigmata of bleeding was found in the duodenal bulb. Biopsies were taken       with a cold forceps for histology. Estimated  blood loss was minimal.      There was a medium-sized lipoma in the second portion of the duodenum.       This has had a EUS performed already.      The exam of the duodenum was otherwise normal. Impression:            - Non-obstructing Schatzki ring.                        - Chronic gastritis.                        - Erythematous duodenopathy. Biopsied.                        - Duodenal lipoma. Recommendation:        - Return patient to hospital ward for ongoing care.                        - Advance diet as tolerated.                        - Continue present medications.                        - Perform an H. pylori stool antigen (HpSA) test.                        - Use Protonix (pantoprazole) 20 mg PO daily.                        - Await pathology results.                        - Resume Plavix (clopidogrel) at prior dose tomorrow                         after discussion of risks vs benefits. Given                         gastritis, higher risk of chronic  blood loss.                        - Perform a RUQ ultrasound at the next available                         appointment to evaluate for any intrinsic liver                         disease. Procedure Code(s):     --- Professional ---                        [phone], Esophagogastroduodenoscopy, flexible,                         transoral; with biopsy, single or multiple Diagnosis Code(s):     --- Professional ---                        K22.2, Esophageal obstruction                        K29.50, Unspecified chronic gastritis without bleeding                        K31.89, Other diseases of stomach and duodenum                        D17.5, Benign lipomatous neoplasm of intra-abdominal                         organs                        K92.1, Melena (includes Hematochezia) CPT copyright 2019 American Medical Association. All rights reserved. The codes documented in this report are preliminary and upon coder review may  be revised  to meet current compliance requirements. Andrey Farmer MD, MD 03/17/2020 2:37:50 PM Number of Addenda: 0 Note Initiated On: 03/17/2020 1:55 PM Estimated Blood Loss:  Estimated blood loss was minimal.      [facility]

## 2020-03-17 NOTE — Anesthesia Preprocedure Evaluation (Signed)
Anesthesia Evaluation  Patient identified by MRN, date of birth, ID band Patient awake    Reviewed: Allergy & Precautions, H&P , NPO status , Patient's Chart, lab work & pertinent test results  History of Anesthesia Complications Negative for: history of anesthetic complications  Airway Mallampati: II  TM Distance: >3 FB Neck ROM: Full    Dental no notable dental hx. (+) Teeth Intact, Poor Dentition, Chipped, Missing, Dental Advisory Given   Pulmonary COPD,  COPD inhaler, Current Smoker and Patient abstained from smoking.,    Pulmonary exam normal breath sounds clear to auscultation       Cardiovascular hypertension, + Peripheral Vascular Disease and +CHF   Rhythm:Regular Rate:Normal - Systolic murmurs    Neuro/Psych Seizures -,  PSYCHIATRIC DISORDERS Anxiety Depression R sided weakness  Neuromuscular disease CVA, Residual Symptoms    GI/Hepatic Neg liver ROS, Melena H/o GIB   Endo/Other  diabetes, Insulin DependentHypothyroidism   Renal/GU ESRF and DialysisRenal diseaseDialyzed yesterdawy  negative genitourinary   Musculoskeletal   Abdominal   Peds  Hematology  (+) Blood dyscrasia, anemia ,   Anesthesia Other Findings  Anemia BPH (benign prostatic hyperplasia)  Chronic kidney disease  Colon polyps  COPD (chronic obstructive pulmonary disease) (HCC) Depression : Diabetes mellitus without complication (HCC)  Diverticulosis of colon  History of kidney stones  Hyperlipidemia  Hypertension  Hypothyroidism  Nephrolithiasis Seizures ([place])     Comment:  2015 last seizure No date: Stroke [facility])  Past Surgical History: 03/30/2019: AV FISTULA PLACEMENT; Left     Comment:  Procedure: right brachiocephalic fistula creation;                Surgeon: Marty Heck, MD;  Location: [place];                Service: Vascular;  Laterality: Left; 04/05/2019: BIOPSY     Comment:  Procedure: BIOPSY;  Surgeon: Wilford Corner, MD;                Location: [place];  Service: Endoscopy;; No date: COLONOSCOPY 05/30/2015: COLONOSCOPY WITH PROPOFOL; N/A     Comment:  Procedure: COLONOSCOPY WITH PROPOFOL;  Surgeon: Manya Silvas, MD;  Location[facility] ENDOSCOPY;  Service:               Endoscopy;  Laterality: N/A; 12/01/2018: COLONOSCOPY WITH PROPOFOL; N/A     Comment:  Procedure: COLONOSCOPY WITH PROPOFOL;  Surgeon: [place],               [name], MD;  Location: [facility] ENDOSCOPY;  Service:               Gastroenterology;  Laterality: N/A; 04/09/2019: COLONOSCOPY WITH PROPOFOL; N/A     Comment:  Procedure: COLONOSCOPY WITH PROPOFOL;  Surgeon: Carol Ada, MD;  Location: [place];  Service:               Endoscopy;  Laterality: N/A; 05/05/2019: ENTEROSCOPY; N/A     Comment:  Procedure: ENTEROSCOPY;  Surgeon: Ronnette Juniper, MD;                Location: [place];  Service: Gastroenterology;                Laterality: N/A;  Push enteroscopy 04/05/2019: ESOPHAGOGASTRODUODENOSCOPY (EGD) WITH PROPOFOL; N/A     Comment:  Procedure: ESOPHAGOGASTRODUODENOSCOPY (  EGD) WITH               PROPOFOL;  Surgeon: Wilford Corner, MD;  Location: [place];  Service: Endoscopy;  Laterality: N/A; 05/01/2019: GIVENS CAPSULE STUDY; N/A     Comment:  Procedure: GIVENS CAPSULE STUDY;  Surgeon: Ronald Lobo, MD;  Location: [place];  Service: Endoscopy;              Laterality: N/A; 04/09/2019: HOT HEMOSTASIS; N/A     Comment:  Procedure: HOT HEMOSTASIS (ARGON PLASMA               COAGULATION/BICAP);  Surgeon: Carol Ada, MD;                Location: [place];  Service: Endoscopy;  Laterality:              N/A; 05/05/2019: HOT HEMOSTASIS; N/A     Comment:  Procedure: HOT HEMOSTASIS (ARGON PLASMA               COAGULATION/BICAP);  Surgeon: Ronnette Juniper, MD;  Location:              [place];  Service: Gastroenterology;  Laterality:                N/A; 03/30/2019: INSERTION OF DIALYSIS CATHETER; Right     Comment:  Procedure: Ultrasound guided right internal jugular               tunneled dialysis catheter placement;  Surgeon: Marty Heck, MD;  Location[facility] OR;  Service: Vascular;               Laterality: Right; No date: kidney stone 06/26/2019: REMOVAL OF A DIALYSIS CATHETER; Right     Comment:  Procedure: REMOVAL OF A DIALYSIS CATHETER ( PD CATH               REMOVAL AND I J CATH REMOVAL;  Surgeon: Katha Cabal, MD;  Location: [facility] ORS;  Service: Vascular;                Laterality: Right; No date: THYROIDECTOMY No date: VARICOCELE EXCISION 05/25/2016: VIDEO BRONCHOSCOPY; Bilateral     Comment:  Procedure: VIDEO BRONCHOSCOPY WITHOUT FLUORO;  Surgeon:               Juanito Doom, MD;  Location[facility] ENDOSCOPY;  Service:              Cardiopulmonary;  Laterality: Bilateral;  BMI    Body Mass Index: 26.38 kg/m      Reproductive/Obstetrics negative OB ROS                             Anesthesia Physical  Anesthesia Plan  ASA: IV  Anesthesia Plan: General   Post-op Pain Management:    Induction: Intravenous  PONV Risk Score and Plan: 2 and Propofol infusion  Airway Management Planned: Nasal Cannula  Additional Equipment: None  Intra-op Plan:   Post-operative Plan:   Informed Consent: I have reviewed the patients History and Physical, chart, labs and discussed the procedure including the risks, benefits and  alternatives for the proposed anesthesia with the patient or authorized representative who has indicated his/her understanding and acceptance.     Dental Advisory Given  Plan Discussed with: CRNA and Surgeon  Anesthesia Plan Comments: (Discussed risks of anesthesia with patient, including possibility of difficulty with spontaneous ventilation under anesthesia necessitating airway intervention, PONV, and rare risks such as cardiac or  respiratory or neurological events. Patient understands.)        Anesthesia Quick Evaluation

## 2020-03-17 NOTE — Progress Notes (Signed)
Patient has returned to room via bed from endo. He is alert and oriented asking for food. Will order dinner tray and prepare patient for discharge. Bed in low position and call bell in reach.

## 2020-03-17 NOTE — Anesthesia Procedure Notes (Signed)
Performed by: Demetrius Charity, CRNA Pre-anesthesia Checklist: Patient identified, Emergency Drugs available, Suction available, Patient being monitored and Timeout performed Patient Re-evaluated:Patient Re-evaluated prior to induction Oxygen Delivery Method: Nasal cannula Induction Type: IV induction Placement Confirmation: CO2 detector and positive ETCO2

## 2020-03-17 NOTE — Transfer of Care (Signed)
Immediate Anesthesia Transfer of Care Note  Patient: Kyle [name]  Procedure(s) Performed: ESOPHAGOGASTRODUODENOSCOPY (EGD) (N/A )  Patient Location: PACU on Endoscopy Unit  Anesthesia Type:General  Level of Consciousness: drowsy  Airway & Oxygen Therapy: Patient Spontanous Breathing and Patient connected to nasal cannula oxygen  Post-op Assessment: Report given to RN and Post -op Vital signs reviewed and stable  Post vital signs: Reviewed and stable  Last Vitals:  Vitals Value Taken Time  BP 130/73 03/17/20 1430  Temp    Pulse 67 03/17/20 1432  Resp 13 03/17/20 1432  SpO2 99 % 03/17/20 1432  Vitals shown include unvalidated device data.  Last Pain:  Vitals:   03/17/20 1401  TempSrc: Temporal  PainSc: 0-No pain         Complications: No complications documented.

## 2020-03-17 NOTE — TOC Progression Note (Addendum)
Transition of Care [facility]) - CM/SW Discharge Note   Patient Details  Name: Kyle [name] MRN: [id number] Date of Birth: 1950-03-12  [facility] CM/SW Contact:  Ova Freshwater Phone Number: [phone] 03/17/2020, 8:28 AM   Clinical Narrative:     CSW confirmed with [facility] the patient is active with them, and they will continue services when the patient sis discharged. Patient has current PT, OT, RN and Aide home health orders.     Final next level of care: Home/Self Care     Patient Goals and CMS Choice        Discharge Placement                       Discharge Plan and Services                                     Social Determinants of Health (SDOH) Interventions     Readmission Risk Interventions Readmission Risk Prevention Plan 01/26/2020  Transportation Screening Complete  Medication Review ([place]) Complete  PCP or Specialist appointment within 3-5 days of discharge Complete  HRI or Home Care Consult Complete  SW Recovery Care/Counseling Consult Complete  Palliative Care Screening Not [place] Complete  Some recent data might be hidden

## 2020-03-17 NOTE — Progress Notes (Signed)
Patient taken via bed by transport to endo for procedure.

## 2020-03-17 NOTE — Discharge Instructions (Signed)
Hyperkalemia Hyperkalemia is when you have too much potassium in your blood. Potassium helps your body in many ways, but having too much can cause problems. If there is too much potassium in your blood, it can affect how your heart works. Potassium is normally removed from your body by your kidneys. Many things can cause the amount in your blood to be high. Medicines and other treatments can be used to bring the amount to a normal level. Treatment may need to be done in the hospital. Follow these instructions at home:  Take over-the-counter and prescription medicines only as told by your doctor.  Do not take any of the following unless your doctor says it is okay: ? Supplements. ? Natural products. ? Herbs. ? Vitamins.  Limit how much alcohol you drink as told by your doctor.  Do not use drugs. If you need help quitting, ask your doctor.  If you have kidney disease, you may need to follow a low-potassium diet. A food specialist (dietitian) can help you.  Keep all follow-up visits as told by your doctor. This is important.   Contact a doctor if:  Your heartbeat is not regular or is very slow.  You feel dizzy (light-headed).  You feel weak.  You feel sick to your stomach (nauseous).  You have tingling in your hands or feet.  You lose feeling (have numbness) in your hands or feet. Get help right away if:  You are short of breath.  You have chest pain.  You pass out (faint).  You cannot move your muscles. Summary  Hyperkalemia is when you have too much potassium in your blood.  Take over-the-counter and prescription medicines only as told by your doctor.  Limit how much alcohol you drink as told by your doctor.  Contact a doctor if your heartbeat is not regular. This information is not intended to replace advice given to you by your health care provider. Make sure you discuss any questions you have with your health care provider. Document Revised: 07/16/2019 Document  Reviewed: 07/16/2019 Elsevier Patient Education  2021 [place].   Weakness Weakness is a lack of strength. You may feel weak all over your body (generalized), or you may feel weak in one part of your body (focal). There are many potential causes of weakness. Sometimes, the cause of your weakness may not be known. Some causes of weakness can be serious, so it is important to see your doctor. Follow these instructions at home: Activity  Rest as needed.  Try to get enough sleep. Most adults need 7-8 hours of sleep each night. Talk to your doctor about how much sleep you need each night.  Do exercises, such as arm curls and leg raises, for 30 minutes at least 2 days a week or as told by your doctor.  Think about working with a physical therapist or trainer to help you get stronger. General instructions  Take over-the-counter and prescription medicines only as told by your doctor.  Eat a healthy, well-balanced diet. This includes: ? Proteins to build muscles, such as lean meats and fish. ? Fresh fruits and vegetables. ? Carbohydrates to boost energy, such as whole grains.  Drink enough fluid to keep your pee (urine) pale yellow.  Keep all follow-up visits as told by your doctor. This is important.   Contact a doctor if:  Your weakness does not get better or it gets worse.  Your weakness affects your ability to: ? Think clearly. ? Do your normal daily  activities. Get help right away if you:  Have sudden weakness on one side of your face or body.  Have chest pain.  Have trouble breathing or shortness of breath.  Have problems with your vision.  Have trouble talking or swallowing.  Have trouble standing or walking.  Are light-headed.  Pass out (lose consciousness). Summary  Weakness is a lack of strength. You may feel weak all over your body or just in one part of your body.  There are many potential causes of weakness. Sometimes, the cause of your weakness may  not be known.  Rest as needed, and try to get enough sleep. Most adults need 7-8 hours of sleep each night.  Eat a healthy, well-balanced diet. This information is not intended to replace advice given to you by your health care provider. Make sure you discuss any questions you have with your health care provider. Document Revised: 09/18/2017 Document Reviewed: 09/18/2017 Elsevier Patient Education  [place].

## 2020-03-17 NOTE — TOC Progression Note (Signed)
Transition of Care [facility]) - Progression Note    Patient Details  Name: Kyle [name] MRN: [id number] Date of Birth: 08-Jan-1951  [facility] CM/SW [place], LCSW Phone Number: 03/17/2020, 10:05 AM  Clinical Narrative:   Spoke with patient. Patient confirmed he is active with [facility] already. Patient agreeable to 3 in 1 being ordered based on PT/OT recommendation. Referral made to [facility] for 3 in 1.    Expected Discharge Plan: [place]    Expected Discharge Plan and Services Expected Discharge Plan: [place]                                               Social Determinants of Health (SDOH) Interventions    Readmission Risk Interventions Readmission Risk Prevention Plan 01/26/2020  Transportation Screening Complete  Medication Review ([name]) Complete  PCP or Specialist appointment within 3-5 days of discharge Complete  HRI or Home Care Consult Complete  SW Recovery Care/Counseling Consult Complete  Palliative Care Screening Not Applicable  Skilled Nursing Facility Complete  Some recent data might be hidden

## 2020-03-17 NOTE — Care Management Obs Status (Signed)
[place] NOTIFICATION   Patient Details  Name: Kyle [name] MRN: [id number] Date of Birth: 16-Mar-1950   Medicare Observation Status Notification Given:  Yes  Reviewed with patient via phone.  [place], LCSW 03/17/2020, 10:04 AM

## 2020-03-17 NOTE — Anesthesia Postprocedure Evaluation (Signed)
Anesthesia Post Note  Patient: Kyle [name]  Procedure(s) Performed: ESOPHAGOGASTRODUODENOSCOPY (EGD) (N/A )  Patient location during evaluation: PACU Anesthesia Type: General Level of consciousness: awake and alert, awake and oriented Pain management: pain level controlled Vital Signs Assessment: post-procedure vital signs reviewed and stable Respiratory status: spontaneous breathing, nonlabored ventilation and respiratory function stable Cardiovascular status: blood pressure returned to baseline and stable Postop Assessment: no apparent nausea or vomiting Anesthetic complications: no   No complications documented.   Last Vitals:  Vitals:   03/17/20 1401 03/17/20 1430  BP: (!) 143/54 130/73  Pulse: 68 67  Resp: 16 16  Temp: 36.5 C   SpO2: 100% 100%    Last Pain:  Vitals:   03/17/20 1430  TempSrc:   PainSc: 0-No pain                 Phill Mutter

## 2020-03-18 ENCOUNTER — Encounter: Payer: Self-pay | Admitting: Gastroenterology

## 2020-03-18 NOTE — TOC Transition Note (Signed)
Transition of Care [facility]) - CM/SW Discharge Note   Patient Details  Name: Kyle [name] MRN: [id number] Date of Birth: [birth year]  [facility]) CM/SW Contact:  Magnus Ivan, LCSW Phone Number: 03/18/2020, 8:40 AM   Clinical Narrative:   Patient discharged with [facility] PT, OT, RN, Aide, [place] of discharge. 3 in 1 already ordered through [facility].    Final next level of care: [place] Barriers to Discharge: Barriers Resolved   Patient Goals and CMS Choice Patient states their goals for this hospitalization and ongoing recovery are:: home with home health CMS Medicare.gov Compare Post Acute Care list provided to:: Patient Choice offered to / list presented to : Patient  Discharge Placement                       Discharge Plan and Services                DME Arranged: 3-N-1 DME Agency: [facility] Date DME Agency Contacted: 03/17/20   Representative spoke with at DME Agency: Nash Shearer [facility] Arranged: PT,OT,RN,Nurse's [place] Work [place]: [facility] Date [place] Agency Contacted: 03/17/20   Representative spoke with at [place]: [place] ([place]) Interventions     Readmission Risk Interventions Readmission Risk Prevention Plan 01/26/2020  Transportation Screening Complete  Medication Review [employer]) Complete  PCP or Specialist appointment within 3-5 days of discharge Complete  HRI or Home Care Consult Complete  SW Recovery Care/Counseling Consult Complete  Palliative Care Screening Not [place] Complete  Some recent data might be hidden

## 2020-03-20 ENCOUNTER — Encounter: Payer: Self-pay | Admitting: Emergency Medicine

## 2020-03-20 ENCOUNTER — Emergency Department: Payer: Medicare PPO

## 2020-03-20 ENCOUNTER — Observation Stay: Payer: Medicare PPO

## 2020-03-20 ENCOUNTER — Other Ambulatory Visit: Payer: Self-pay

## 2020-03-20 ENCOUNTER — Inpatient Hospital Stay
Admission: EM | Admit: 2020-03-20 | Discharge: 2020-03-31 | DRG: 082 | Disposition: A | Discharge status: Hospice | Payer: Medicare PPO | Attending: Family Medicine | Admitting: Family Medicine

## 2020-03-20 DIAGNOSIS — Y92009 Unspecified place in unspecified non-institutional (private) residence as the place of occurrence of the external cause: Secondary | ICD-10-CM

## 2020-03-20 DIAGNOSIS — Z992 Dependence on renal dialysis: Secondary | ICD-10-CM

## 2020-03-20 DIAGNOSIS — Z515 Encounter for palliative care: Secondary | ICD-10-CM

## 2020-03-20 DIAGNOSIS — E1122 Type 2 diabetes mellitus with diabetic chronic kidney disease: Secondary | ICD-10-CM | POA: Diagnosis present

## 2020-03-20 DIAGNOSIS — S065X9A Traumatic subdural hemorrhage with loss of consciousness of unspecified duration, initial encounter: Secondary | ICD-10-CM

## 2020-03-20 DIAGNOSIS — R55 Syncope and collapse: Secondary | ICD-10-CM | POA: Diagnosis present

## 2020-03-20 DIAGNOSIS — Z20822 Contact with and (suspected) exposure to covid-19: Secondary | ICD-10-CM | POA: Diagnosis present

## 2020-03-20 DIAGNOSIS — Z79899 Other long term (current) drug therapy: Secondary | ICD-10-CM

## 2020-03-20 DIAGNOSIS — E1151 Type 2 diabetes mellitus with diabetic peripheral angiopathy without gangrene: Secondary | ICD-10-CM | POA: Diagnosis present

## 2020-03-20 DIAGNOSIS — G9341 Metabolic encephalopathy: Secondary | ICD-10-CM | POA: Diagnosis present

## 2020-03-20 DIAGNOSIS — E039 Hypothyroidism, unspecified: Secondary | ICD-10-CM | POA: Diagnosis present

## 2020-03-20 DIAGNOSIS — N400 Enlarged prostate without lower urinary tract symptoms: Secondary | ICD-10-CM | POA: Diagnosis present

## 2020-03-20 DIAGNOSIS — I5032 Chronic diastolic (congestive) heart failure: Secondary | ICD-10-CM | POA: Diagnosis present

## 2020-03-20 DIAGNOSIS — N186 End stage renal disease: Secondary | ICD-10-CM | POA: Diagnosis present

## 2020-03-20 DIAGNOSIS — F32A Depression, unspecified: Secondary | ICD-10-CM | POA: Diagnosis present

## 2020-03-20 DIAGNOSIS — F419 Anxiety disorder, unspecified: Secondary | ICD-10-CM | POA: Diagnosis present

## 2020-03-20 DIAGNOSIS — Z8719 Personal history of other diseases of the digestive system: Secondary | ICD-10-CM

## 2020-03-20 DIAGNOSIS — Z825 Family history of asthma and other chronic lower respiratory diseases: Secondary | ICD-10-CM

## 2020-03-20 DIAGNOSIS — N2581 Secondary hyperparathyroidism of renal origin: Secondary | ICD-10-CM | POA: Diagnosis present

## 2020-03-20 DIAGNOSIS — R296 Repeated falls: Secondary | ICD-10-CM

## 2020-03-20 DIAGNOSIS — D631 Anemia in chronic kidney disease: Secondary | ICD-10-CM | POA: Diagnosis present

## 2020-03-20 DIAGNOSIS — Z79890 Hormone replacement therapy (postmenopausal): Secondary | ICD-10-CM

## 2020-03-20 DIAGNOSIS — F17210 Nicotine dependence, cigarettes, uncomplicated: Secondary | ICD-10-CM | POA: Diagnosis present

## 2020-03-20 DIAGNOSIS — W19XXXA Unspecified fall, initial encounter: Secondary | ICD-10-CM

## 2020-03-20 DIAGNOSIS — J329 Chronic sinusitis, unspecified: Secondary | ICD-10-CM | POA: Diagnosis present

## 2020-03-20 DIAGNOSIS — Z66 Do not resuscitate: Secondary | ICD-10-CM | POA: Diagnosis present

## 2020-03-20 DIAGNOSIS — G4733 Obstructive sleep apnea (adult) (pediatric): Secondary | ICD-10-CM | POA: Diagnosis present

## 2020-03-20 DIAGNOSIS — Y92230 Patient room in hospital as the place of occurrence of the external cause: Secondary | ICD-10-CM | POA: Diagnosis not present

## 2020-03-20 DIAGNOSIS — S065XAA Traumatic subdural hemorrhage with loss of consciousness status unknown, initial encounter: Secondary | ICD-10-CM

## 2020-03-20 DIAGNOSIS — Z87442 Personal history of urinary calculi: Secondary | ICD-10-CM

## 2020-03-20 DIAGNOSIS — R4701 Aphasia: Secondary | ICD-10-CM | POA: Diagnosis present

## 2020-03-20 DIAGNOSIS — I69354 Hemiplegia and hemiparesis following cerebral infarction affecting left non-dominant side: Secondary | ICD-10-CM

## 2020-03-20 DIAGNOSIS — I609 Nontraumatic subarachnoid hemorrhage, unspecified: Secondary | ICD-10-CM

## 2020-03-20 DIAGNOSIS — Z7902 Long term (current) use of antithrombotics/antiplatelets: Secondary | ICD-10-CM

## 2020-03-20 DIAGNOSIS — S066X9A Traumatic subarachnoid hemorrhage with loss of consciousness of unspecified duration, initial encounter: Secondary | ICD-10-CM | POA: Diagnosis not present

## 2020-03-20 DIAGNOSIS — Z888 Allergy status to other drugs, medicaments and biological substances status: Secondary | ICD-10-CM

## 2020-03-20 DIAGNOSIS — E890 Postprocedural hypothyroidism: Secondary | ICD-10-CM | POA: Diagnosis present

## 2020-03-20 DIAGNOSIS — E785 Hyperlipidemia, unspecified: Secondary | ICD-10-CM | POA: Diagnosis present

## 2020-03-20 DIAGNOSIS — G40909 Epilepsy, unspecified, not intractable, without status epilepticus: Secondary | ICD-10-CM

## 2020-03-20 DIAGNOSIS — Z833 Family history of diabetes mellitus: Secondary | ICD-10-CM

## 2020-03-20 DIAGNOSIS — J449 Chronic obstructive pulmonary disease, unspecified: Secondary | ICD-10-CM | POA: Diagnosis present

## 2020-03-20 DIAGNOSIS — I132 Hypertensive heart and chronic kidney disease with heart failure and with stage 5 chronic kidney disease, or end stage renal disease: Secondary | ICD-10-CM | POA: Diagnosis present

## 2020-03-20 LAB — CBC
HCT: 33.4 % — ABNORMAL LOW (ref 39.0–52.0)
Hemoglobin: 10.8 g/dL — ABNORMAL LOW (ref 13.0–17.0)
MCH: 32.1 pg (ref 26.0–34.0)
MCHC: 32.3 g/dL (ref 30.0–36.0)
MCV: 99.4 fL (ref 80.0–100.0)
Platelets: 140 10*3/uL — ABNORMAL LOW (ref 150–400)
RBC: 3.36 MIL/uL — ABNORMAL LOW (ref 4.22–5.81)
RDW: 14.5 % (ref 11.5–15.5)
WBC: 5.4 10*3/uL (ref 4.0–10.5)
nRBC: 0 % (ref 0.0–0.2)

## 2020-03-20 LAB — CBG MONITORING, ED: Glucose-Capillary: 165 mg/dL — ABNORMAL HIGH (ref 70–99)

## 2020-03-20 LAB — SARS CORONAVIRUS 2 BY RT PCR (HOSPITAL ORDER, PERFORMED IN ~~LOC~~ HOSPITAL LAB): SARS Coronavirus 2: NEGATIVE

## 2020-03-20 LAB — TYPE AND SCREEN
ABO/RH(D): O POS
Antibody Screen: NEGATIVE

## 2020-03-20 LAB — BASIC METABOLIC PANEL
Anion gap: 14 (ref 5–15)
BUN: 30 mg/dL — ABNORMAL HIGH (ref 8–23)
CO2: 26 mmol/L (ref 22–32)
Calcium: 7 mg/dL — ABNORMAL LOW (ref 8.9–10.3)
Chloride: 98 mmol/L (ref 98–111)
Creatinine, Ser: 6.78 mg/dL — ABNORMAL HIGH (ref 0.61–1.24)
GFR, Estimated: 8 mL/min — ABNORMAL LOW (ref >60)
Glucose, Bld: 209 mg/dL — ABNORMAL HIGH (ref 70–99)
Potassium: 4.3 mmol/L (ref 3.5–5.1)
Sodium: 138 mmol/L (ref 135–145)

## 2020-03-20 LAB — PROTIME-INR
INR: 1.1 (ref 0.8–1.2)
Prothrombin Time: 13.6 seconds (ref 11.4–15.2)

## 2020-03-20 MED ORDER — AMOXICILLIN-POT CLAVULANATE 875-125 MG PO TABS
1.0000 | ORAL_TABLET | ORAL | Status: DC
  Administered 2020-03-20: 1 via ORAL
  Filled 2020-03-20: qty 1

## 2020-03-20 MED ORDER — CALCIUM ACETATE (PHOS BINDER) 667 MG PO CAPS
1334.0000 mg | ORAL_CAPSULE | Freq: Three times a day (TID) | ORAL | Status: DC
  Administered 2020-03-20 – 2020-03-30 (×23): 1334 mg via ORAL
  Filled 2020-03-20 (×31): qty 2

## 2020-03-20 MED ORDER — METOPROLOL SUCCINATE ER 50 MG PO TB24
50.0000 mg | ORAL_TABLET | Freq: Every day | ORAL | Status: DC
  Administered 2020-03-20 – 2020-03-29 (×9): 50 mg via ORAL
  Filled 2020-03-20 (×11): qty 1

## 2020-03-20 MED ORDER — PANTOPRAZOLE SODIUM 40 MG PO TBEC
40.0000 mg | DELAYED_RELEASE_TABLET | Freq: Every day | ORAL | Status: DC
  Administered 2020-03-21 – 2020-03-29 (×9): 40 mg via ORAL
  Filled 2020-03-20 (×10): qty 1

## 2020-03-20 MED ORDER — AMOXICILLIN-POT CLAVULANATE 500-125 MG PO TABS
1.0000 | ORAL_TABLET | ORAL | Status: DC
  Administered 2020-03-22 – 2020-03-29 (×8): 500 mg via ORAL
  Filled 2020-03-20 (×12): qty 1

## 2020-03-20 MED ORDER — LEVOTHYROXINE SODIUM 50 MCG PO TABS
200.0000 ug | ORAL_TABLET | Freq: Every day | ORAL | Status: DC
  Administered 2020-03-21 – 2020-03-30 (×7): 200 ug via ORAL
  Filled 2020-03-20: qty 4
  Filled 2020-03-20 (×4): qty 2
  Filled 2020-03-20: qty 4
  Filled 2020-03-20: qty 2
  Filled 2020-03-20: qty 4
  Filled 2020-03-20: qty 2

## 2020-03-20 MED ORDER — ACETAMINOPHEN 325 MG PO TABS
650.0000 mg | ORAL_TABLET | Freq: Four times a day (QID) | ORAL | Status: DC | PRN
Start: 2020-03-20 — End: 2020-03-30
  Administered 2020-03-22: 650 mg via ORAL
  Filled 2020-03-20: qty 2

## 2020-03-20 MED ORDER — INSULIN ASPART 100 UNIT/ML ~~LOC~~ SOLN
0.0000 [IU] | Freq: Three times a day (TID) | SUBCUTANEOUS | Status: DC
  Administered 2020-03-20 – 2020-03-22 (×2): 1 [IU] via SUBCUTANEOUS
  Administered 2020-03-24: 2 [IU] via SUBCUTANEOUS
  Administered 2020-03-24: 1 [IU] via SUBCUTANEOUS
  Administered 2020-03-25: 2 [IU] via SUBCUTANEOUS
  Administered 2020-03-26 – 2020-03-28 (×4): 1 [IU] via SUBCUTANEOUS
  Filled 2020-03-20 (×9): qty 1

## 2020-03-20 MED ORDER — ACETAMINOPHEN 650 MG RE SUPP: 650.0000 mg | Freq: Four times a day (QID) | RECTAL | Status: DC | PRN

## 2020-03-20 MED ORDER — LEVETIRACETAM 500 MG PO TABS
500.0000 mg | ORAL_TABLET | Freq: Two times a day (BID) | ORAL | Status: DC
  Administered 2020-03-20 – 2020-03-30 (×21): 500 mg via ORAL
  Filled 2020-03-20 (×24): qty 1

## 2020-03-20 MED ORDER — ATORVASTATIN CALCIUM 10 MG PO TABS
10.0000 mg | ORAL_TABLET | Freq: Every day | ORAL | Status: DC
  Administered 2020-03-21 – 2020-03-29 (×9): 10 mg via ORAL
  Filled 2020-03-20 (×10): qty 1

## 2020-03-20 MED ORDER — RENA-VITE PO TABS
1.0000 | ORAL_TABLET | Freq: Every day | ORAL | Status: DC
  Administered 2020-03-22 – 2020-03-28 (×4): 1 via ORAL
  Filled 2020-03-20 (×11): qty 1

## 2020-03-20 MED ORDER — PANCRELIPASE (LIP-PROT-AMYL) 12000-38000 UNITS PO CPEP
24000.0000 [IU] | ORAL_CAPSULE | Freq: Three times a day (TID) | ORAL | Status: DC
  Administered 2020-03-20 – 2020-03-30 (×23): 24000 [IU] via ORAL
  Filled 2020-03-20 (×32): qty 2

## 2020-03-20 MED ORDER — LIDOCAINE-PRILOCAINE 2.5-2.5 % EX CREA
1.0000 "application " | TOPICAL_CREAM | CUTANEOUS | Status: DC
  Filled 2020-03-20: qty 5

## 2020-03-20 MED ORDER — IOHEXOL 350 MG/ML SOLN
75.0000 mL | Freq: Once | INTRAVENOUS | Status: AC | PRN
  Administered 2020-03-20: 75 mL via INTRAVENOUS

## 2020-03-20 MED ORDER — GABAPENTIN 100 MG PO CAPS
100.0000 mg | ORAL_CAPSULE | Freq: Two times a day (BID) | ORAL | Status: DC
  Administered 2020-03-20 – 2020-03-29 (×18): 100 mg via ORAL
  Filled 2020-03-20 (×19): qty 1

## 2020-03-20 MED ORDER — SODIUM CHLORIDE 0.9% FLUSH
3.0000 mL | Freq: Two times a day (BID) | INTRAVENOUS | Status: DC
  Administered 2020-03-20 – 2020-03-29 (×11): 3 mL via INTRAVENOUS

## 2020-03-20 MED ORDER — TRAZODONE HCL 100 MG PO TABS
100.0000 mg | ORAL_TABLET | Freq: Every day | ORAL | Status: DC
  Administered 2020-03-20 – 2020-03-29 (×10): 100 mg via ORAL
  Filled 2020-03-20 (×10): qty 1

## 2020-03-20 NOTE — ED Provider Notes (Signed)
[facility] Emergency Department Provider Note  ____________________________________________   Event Date/Time   First MD Initiated Contact with Patient 03/20/20 [phone]     (approximate)  I have reviewed the triage vital signs and the nursing notes.   HISTORY  Chief Complaint Fall    HPI Kyle [name] is a 70 y.o. male with CKD, hypertension, hyperlipidemia, stroke, on Plavix who comes in for fall.  Patient is unclear how he fell.  He reports mild headache, constant, nothing makes better, nothing makes it worse.  He is alert and oriented.  Denies any chest wall pain, abdominal pain, extremity pain.          Past Medical History:  Diagnosis Date  . Anemia   . Anxiety   . BPH (benign prostatic hyperplasia)   . Chronic kidney disease   . Colon polyps   . COPD (chronic obstructive pulmonary disease) ([place])   . Depression   . Diabetes mellitus without complication ([place])   . Diverticulosis of colon   . History of kidney stones   . Hyperlipidemia   . Hypertension   . Hypothyroidism   . Nephrolithiasis   . Seizures ([place])    2015 last seizure  . Stroke [facility])     Patient Active Problem List   Diagnosis Date Noted  . Chronic diastolic CHF (congestive heart failure) ([place]) 03/16/2020  . Hyperkalemia 03/15/2020  . Seizure ([place]) 01/25/2020  . Depression   . Acute GI bleeding 07/28/2019  . Melena 07/22/2019  . History of GI bleed 07/22/2019  . Current every day smoker 06/22/2019  . Falls frequently 06/22/2019  . Palliative care by specialist   . Goals of care, counseling/discussion   . Orthostasis 05/02/2019  . ESRD (end stage renal disease) on dialysis ([place])   . Acute blood loss anemia 04/30/2019  . Acute on chronic blood loss anemia 04/29/2019  . History of CVA (cerebrovascular accident) 04/29/2019  . GI bleed 04/04/2019  . 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[place]) 03/20/2019  . Generalized weakness   . Diabetic polyneuropathy associated with type 2  diabetes mellitus ([place])   . Cerebrovascular accident (CVA) ([place])   . PAD (peripheral artery disease) ([place]) 03/17/2019  . Ptosis of eyelid, left 03/15/2019  . Physical deconditioning 03/14/2019  . Hypomagnesemia 03/13/2019  . Hyperphosphatemia 03/13/2019  . ESRD (end stage renal disease) ([place]) 03/13/2019  . Chronic kidney disease with peritoneal dialysis as preferred modality, stage 5 ([place]) 03/13/2019  . Prolonged QT interval 03/13/2019  . Hypertension associated with diabetes ([place]) 03/13/2019  . Hypothyroidism 03/13/2019  . Chronic diarrhea 03/13/2019  . Hypocalcemia 03/12/2019  . Ataxia 03/10/2019  . Dizziness 03/10/2019  . Vision problem 03/10/2019  . Hypokalemia 09/16/2018  . ESRD on peritoneal dialysis ([place]) 03/04/2018  . Lung nodule 06/12/2016  . Narrowing of airway   . Cigarette smoker 05/24/2016  . Collapse of right lung 05/24/2016  . COPD with chronic bronchitis ([place]) 05/24/2016  . Diarrhea due to malabsorption 07/01/2015  . Hx of adenomatous colonic polyps 07/01/2015  . Acne 05/20/2015  . Calculus of kidney 05/20/2015  . Chest pain, non-cardiac 05/20/2015  . Seizure disorder ([place]) 05/20/2015  . Current tobacco use 05/20/2015  . Chronic kidney disease (CKD), stage III (moderate) ([place]) 03/29/2015  . Insulin dependent type 2 diabetes mellitus ([place]) 03/15/2015  . Lipoma of shoulder 03/08/2014  . Other synovitis and tenosynovitis, right shoulder 03/08/2014  . Right supraspinatus tenosynovitis 03/08/2014  . Bursitis of elbow 02/15/2014  . Olecranon bursitis of  right elbow 02/15/2014  . Absolute anemia 08/08/2013  . 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 Back pain, chronic 08/08/2013  . Chronic obstructive pulmonary disease ([place]) 08/08/2013  . BP (high blood pressure) 08/08/2013  . Hyperlipidemia associated with type 2 diabetes mellitus ([place]) 08/08/2013  . Acne erythematosa 08/08/2013    Past Surgical History:  Procedure Laterality Date  . AV FISTULA PLACEMENT  Left 03/30/2019   Procedure: right brachiocephalic fistula creation;  Surgeon: Marty Heck, MD;  Location: [place];  Service: Vascular;  Laterality: Left;  . BIOPSY  04/05/2019   Procedure: BIOPSY;  Surgeon: Wilford Corner, MD;  Location: [place];  Service: Endoscopy;;  . COLONOSCOPY    . COLONOSCOPY WITH PROPOFOL N/A 05/30/2015   Procedure: COLONOSCOPY WITH PROPOFOL;  Surgeon: Manya Silvas, MD;  Location: [facility] ENDOSCOPY;  Service: Endoscopy;  Laterality: N/A;  . COLONOSCOPY WITH PROPOFOL N/A 12/01/2018   Procedure: COLONOSCOPY WITH PROPOFOL;  Surgeon: Tol[place], [name], MD;  Location: [facility] ENDOSCOPY;  Service: Gastroenterology;  Laterality: N/A;  . COLONOSCOPY WITH PROPOFOL N/A 04/09/2019   Procedure: COLONOSCOPY WITH PROPOFOL;  Surgeon: Carol Ada, MD;  Location: [place];  Service: Endoscopy;  Laterality: N/A;  . ENTEROSCOPY N/A 05/05/2019   Procedure: ENTEROSCOPY;  Surgeon: Ronnette Juniper, MD;  Location: [facility] ENDOSCOPY;  Service: Gastroenterology;  Laterality: N/A;  Push enteroscopy  . ENTEROSCOPY N/A 07/28/2019   Procedure: ENTEROSCOPY;  Surgeon: Virgel Manifold, MD;  Location: [facility] ENDOSCOPY;  Service: Endoscopy;  Laterality: N/A;  . ESOPHAGOGASTRODUODENOSCOPY N/A 03/17/2020   Procedure: ESOPHAGOGASTRODUODENOSCOPY (EGD);  Surgeon: Lesly Rubenstein, MD;  Location: [facility] ENDOSCOPY;  Service: Endoscopy;  Laterality: N/A;  . ESOPHAGOGASTRODUODENOSCOPY (EGD) WITH PROPOFOL N/A 04/05/2019   Procedure: ESOPHAGOGASTRODUODENOSCOPY (EGD) WITH PROPOFOL;  Surgeon: Wilford Corner, MD;  Location: [place];  Service: Endoscopy;  Laterality: N/A;  . GIVENS CAPSULE STUDY N/A 05/01/2019   Procedure: GIVENS CAPSULE STUDY;  Surgeon: Ronald Lobo, MD;  Location: [place];  Service: Endoscopy;  Laterality: N/A;  . HOT HEMOSTASIS N/A 04/09/2019   Procedure: HOT HEMOSTASIS (ARGON PLASMA COAGULATION/BICAP);  Surgeon: Carol Ada, MD;  Location: [place];  Service: Endoscopy;   Laterality: N/A;  . HOT HEMOSTASIS N/A 05/05/2019   Procedure: HOT HEMOSTASIS (ARGON PLASMA COAGULATION/BICAP);  Surgeon: Ronnette Juniper, MD;  Location: [place];  Service: Gastroenterology;  Laterality: N/A;  . INSERTION OF DIALYSIS CATHETER Right 03/30/2019   Procedure: Ultrasound guided right internal jugular tunneled dialysis catheter placement;  Surgeon: Marty Heck, MD;  Location: [facility] OR;  Service: Vascular;  Laterality: Right;  . kidney stone    . REMOVAL OF A DIALYSIS CATHETER Right 06/26/2019   Procedure: REMOVAL OF A DIALYSIS CATHETER ( PD CATH REMOVAL AND I J CATH REMOVAL;  Surgeon: Katha Cabal, MD;  Location: [facility] ORS;  Service: Vascular;  Laterality: Right;  . THYROIDECTOMY    . VARICOCELE EXCISION    . VIDEO BRONCHOSCOPY Bilateral 05/25/2016   Procedure: VIDEO BRONCHOSCOPY WITHOUT FLUORO;  Surgeon: Juanito Doom, MD;  Location: [facility] ENDOSCOPY;  Service: Cardiopulmonary;  Laterality: Bilateral;    Prior to Admission medications   Medication Sig Start Date End Date Taking? Authorizing Provider  atorvastatin (LIPITOR) 10 MG tablet Take 10 mg by mouth daily. 01/08/20   [provider]  B Complex-C-Folic Acid (NEPHRO-VITE PO) Take 1 tablet by mouth daily.    [provider]  calcium acetate (PHOSLO) 667 MG capsule Take 2 capsules (1,334 mg total) by mouth 3 (three) times daily with meals. 03/20/19  Loletha Grayer, MD  clopidogrel (PLAVIX) 75 MG tablet Take 75 mg by mouth daily.    [provider]  escitalopram (LEXAPRO) 10 MG tablet Take 10 mg by mouth daily. 07/14/19   [provider]  gabapentin (NEURONTIN) 100 MG capsule Take 1 capsule (100 mg total) by mouth 2 (two) times daily. 01/28/20   Pokhrel, Corrie Mckusick, MD  levETIRAcetam (KEPPRA) 500 MG tablet Take 500 mg by mouth 2 (two) times daily. 03/09/20   [provider]  levothyroxine (SYNTHROID) 200 MCG tablet Take 200 mcg by mouth daily before breakfast.  02/25/19   [provider]  lidocaine-prilocaine (EMLA) cream Apply 1 application topically Every Tuesday,Thursday,and Saturday with dialysis.     [provider]  metoprolol succinate (TOPROL-XL) 50 MG 24 hr tablet Take 1 tablet by mouth daily. 02/22/20   [provider]  Pancrelipase, Lip-Prot-Amyl, 24000-76000 units CPEP Take 1 capsule by mouth 3 (three) times daily with meals.     [provider]  pantoprazole (PROTONIX) 40 MG tablet Take 40 mg by mouth daily.    [provider]  traZODone (DESYREL) 100 MG tablet Take 100 mg by mouth at bedtime. 07/14/19   [provider]    Allergies Vimpat [lacosamide]  Family History  Problem Relation Age of Onset  . Diabetes Mother   . Diabetes Maternal Grandmother   . Diabetes Maternal Grandfather   . Lung cancer Father   . Emphysema Paternal Grandfather     Social History Social History   Tobacco Use  . Smoking status: Light Tobacco Smoker    Packs/day: 0.25    Years: 40.00    Pack years: 10.00    Types: Cigarettes    Last attempt to quit: 05/25/2016    Years since quitting: 3.8  . Smokeless tobacco: Never Used  Vaping Use  . Vaping Use: Never used  Substance Use Topics  . Alcohol use: Not Currently  . Drug use: No      Review of Systems Constitutional: No fever/chills, fall Eyes: No visual changes. ENT: No sore throat. Cardiovascular: Denies chest pain. Respiratory: Denies shortness of breath. Gastrointestinal: No abdominal pain.  No nausea, no vomiting.  No diarrhea.  No constipation. Genitourinary: Negative for dysuria. Musculoskeletal: Negative for back pain. Skin: Negative for rash. Neurological: Headaches, no focal weakness or numbness. All other ROS negative ____________________________________________   PHYSICAL EXAM:  VITAL SIGNS: ED Triage Vitals [03/20/20 0705]  Enc Vitals Group     BP (!) 165/71     Pulse Rate 71     Resp 16     Temp 97.9 F (36.6 C)     Temp Source  Oral     SpO2 96 %     Weight 176 lb (79.8 kg)     Height 6' (1.829 m)     Head Circumference      Peak Flow      Pain Score 0     Pain Loc      Pain Edu?      Excl. in [place]?     Constitutional: Alert and oriented. GCS 15  Eyes: Swelling and bruising around the right eye.  Extraocular movements are intact.  Vision is intact.  Pupil equal and reactive bilaterally Head: Atraumatic. Nose: No congestion/rhinnorhea. Mouth/Throat: Mucous membranes are moist.   Neck: No stridor. Trachea Midline. FROM Cardiovascular: Normal rate, regular rhythm. Grossly normal heart sounds.  Good peripheral circulation. No chest wall tenderness Respiratory: Normal respiratory effort.  No retractions.  Lungs CTAB. Gastrointestinal: Soft and nontender. No distention. No abdominal bruits.  Musculoskeletal:   RUE: No point tenderness, deformity or other signs of injury. Radial pulse intact. Neuro intact. Full ROM in joint.  Small skin tears but denies any tenderness LUE: No point tenderness, deformity or other signs of injury. Radial pulse intact. Neuro intact. Full ROM in joints RLE: No point tenderness, deformity or other signs of injury. DP pulse intact. Neuro intact. Full ROM in joints. LLE: No point tenderness, deformity or other signs of injury. DP pulse intact. Neuro intact. Full ROM in joints. Neurologic:  Normal speech and language. No gross focal neurologic deficits are appreciated.  Skin:  Skin is warm, dry and intact. No rash noted. Psychiatric: Mood and affect are normal. Speech and behavior are normal. GU: Deferred   ____________________________________________   LABS (all labs ordered are listed, but only abnormal results are displayed)  Labs Reviewed  BASIC METABOLIC PANEL - Abnormal; Notable for the following components:      Result Value   Glucose, Bld 209 (*)    BUN 30 (*)    Creatinine, Ser 6.78 (*)    Calcium 7.0 (*)    GFR, Estimated 8 (*)    All other components within normal  limits  CBC - Abnormal; Notable for the following components:   RBC 3.36 (*)    Hemoglobin 10.8 (*)    HCT 33.4 (*)    Platelets 140 (*)    All other components within normal limits  SARS CORONAVIRUS 2 BY RT PCR (HOSPITAL ORDER, [place] [facility])  PROTIME-INR  URINALYSIS, COMPLETE (UACMP) WITH MICROSCOPIC  CBG MONITORING, ED  TYPE AND SCREEN   ____________________________________________   ED ECG REPORT I, Vanessa [place], the attending physician, personally viewed and interpreted this ECG.  Normal sinus rate of 71, bifascicular block, no ST elevation T wave inversion in V2 through V4 ____________________________________________  RADIOLOGY I, Vanessa [place], personally viewed and evaluated these images (plain radiographs) as part of my medical decision making, as well as reviewing the written report by the radiologist.  ED MD interpretation: Subdural noted with small subarachnoid  Official radiology report(s): CT Head Wo Contrast  Result Date: 03/20/2020 CLINICAL DATA:  Fall with facial trauma EXAM: CT HEAD WITHOUT CONTRAST CT MAXILLOFACIAL WITHOUT CONTRAST CT CERVICAL SPINE WITHOUT CONTRAST TECHNIQUE: Multidetector CT imaging of the head, cervical spine, and maxillofacial structures were performed using the standard protocol without intravenous contrast. Multiplanar CT image reconstructions of the cervical spine and maxillofacial structures were also generated. COMPARISON:  01/25/2020 FINDINGS: CT HEAD FINDINGS Brain: High-density/acute hemorrhage in the subarachnoid and possibly subdural spaces over the lateral left temporal lobe, in total measuring up to 3 mm in thickness. No associated mass effect. No visible parenchymal hemorrhage or swelling. Brain atrophy and chronic small vessel ischemia. No evidence of acute infarct. 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 Vascular: No hyperdense vessel or unexpected calcification. Skull: Posterior scalp swelling  without calvarial fracture. CT MAXILLOFACIAL FINDINGS Osseous: No acute fracture or mandibular dislocation. Orbits: Swelling superficial to the right orbit. No visible globe or postseptal injury. Bilateral cataract resection. 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 Sinuses: Chronic sinusitis with opacified left maxillary, anterior ethmoid, and frontal sinuses. Left maxillary wall is sclerotic and thickened with a broad dehiscence along the posterolateral wall that is traversed by partially calcified material. No sinus mass was seen on a April 07, 2009 brain MRI, but dehiscence  had not been noted at that time either. Soft tissues: Hazy retro antral fat on the left, stable where covered on prior head CT 01/25/2020. CT CERVICAL SPINE FINDINGS Alignment: No traumatic malalignment. Skull base and vertebrae: Negative for cervical spine fracture. T1 and T2 superior endplate fractures with trabecular impaction, nonacute in seen on a 2021 chest CT. Soft tissues and spinal canal: No prevertebral fluid or swelling. No visible canal hematoma. Mildly enlarged lymph nodes in the upper right jugular chain, posterior to the internal jugular vein, unchanged from November 2021 and thus presumably reactive Disc levels: Multilevel degenerative facet spurring asymmetric to the right. Ordinary mid and lower cervical disc degeneration. Upper chest: Layering right pleural effusion, small where covered. Critical Value/emergent results were called by telephone at the time of interpretation on 03/20/2020 at 8:00 am to provider Lavonia Drafts , who verbally acknowledged these results. IMPRESSION: 1. Small volume subarachnoid and possibly subdural hemorrhage along the superficial left temporal lobe. No significant mass effect. 2. Scalp and facial swelling without acute fracture. 3. Negative for cervical spine fracture. 4. 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 Complicated disease at the  left maxillary sinus with chronic defect in the posterior wall that does not appear progressed from November 2021 but is new from January 2021. ENT referral is recommended if appropriate for comorbidities. Electronically Signed   By: Monte Fantasia M.D.   On: 03/20/2020 08:07   CT Cervical Spine Wo Contrast  Result Date: 03/20/2020 CLINICAL DATA:  Fall with facial trauma EXAM: CT HEAD WITHOUT CONTRAST CT MAXILLOFACIAL WITHOUT CONTRAST CT CERVICAL SPINE WITHOUT CONTRAST TECHNIQUE: Multidetector CT imaging of the head, cervical spine, and maxillofacial structures were performed using the standard protocol without intravenous contrast. Multiplanar CT image reconstructions of the cervical spine and maxillofacial structures were also generated. COMPARISON:  01/25/2020 FINDINGS: CT HEAD FINDINGS Brain: High-density/acute hemorrhage in the subarachnoid and possibly subdural spaces over the lateral left temporal lobe, in total measuring up to 3 mm in thickness. No associated mass effect. No visible parenchymal hemorrhage or swelling. Brain atrophy and chronic small vessel ischemia. No evidence of acute infarct. 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 Vascular: No hyperdense vessel or unexpected calcification. Skull: Posterior scalp swelling without calvarial fracture. CT MAXILLOFACIAL FINDINGS Osseous: No acute fracture or mandibular dislocation. Orbits: Swelling superficial to the right orbit. No visible globe or postseptal injury. Bilateral cataract resection. 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 Sinuses: Chronic sinusitis with opacified left maxillary, anterior ethmoid, and frontal sinuses. Left maxillary wall is sclerotic and thickened with a broad dehiscence along the posterolateral wall that is traversed by partially calcified material. No sinus mass was seen on a April 07, 2009 brain MRI, but dehiscence had not been noted at that time either. Soft  tissues: Hazy retro antral fat on the left, stable where covered on prior head CT 01/25/2020. CT CERVICAL SPINE FINDINGS Alignment: No traumatic malalignment. Skull base and vertebrae: Negative for cervical spine fracture. T1 and T2 superior endplate fractures with trabecular impaction, nonacute in seen on a 2021 chest CT. Soft tissues and spinal canal: No prevertebral fluid or swelling. No visible canal hematoma. Mildly enlarged lymph nodes in the upper right jugular chain, posterior to the internal jugular vein, unchanged from November 2021 and thus presumably reactive Disc levels: Multilevel degenerative facet spurring asymmetric to the right. Ordinary mid and lower cervical disc degeneration. Upper chest: Layering right pleural effusion, small where covered. Critical Value/emergent results were called by  telephone at the time of interpretation on 03/20/2020 at 8:00 am to provider Lavonia Drafts , who verbally acknowledged these results. IMPRESSION: 1. Small volume subarachnoid and possibly subdural hemorrhage along the superficial left temporal lobe. No significant mass effect. 2. Scalp and facial swelling without acute fracture. 3. Negative for cervical spine fracture. 4. 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 ENT referral is recommended if appropriate for comorbidities. Electronically Signed   By: Monte Fantasia M.D.   On: 03/20/2020 08:07   CT Maxillofacial Wo Contrast  Result Date: 03/20/2020 CLINICAL DATA:  Fall with facial trauma EXAM: CT HEAD WITHOUT CONTRAST CT MAXILLOFACIAL WITHOUT CONTRAST CT CERVICAL SPINE WITHOUT CONTRAST TECHNIQUE: Multidetector CT imaging of the head, cervical spine, and maxillofacial structures were performed using the standard protocol without intravenous contrast. Multiplanar CT image reconstructions  of the cervical spine and maxillofacial structures were also generated. COMPARISON:  01/25/2020 FINDINGS: CT HEAD FINDINGS Brain: High-density/acute hemorrhage in the subarachnoid and possibly subdural spaces over the lateral left temporal lobe, in total measuring up to 3 mm in thickness. No associated mass effect. No visible parenchymal hemorrhage or swelling. Brain atrophy and chronic small vessel ischemia. No evidence of acute infarct. 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 Vascular: No hyperdense vessel or unexpected calcification. Skull: Posterior scalp swelling without calvarial fracture. CT MAXILLOFACIAL FINDINGS Osseous: No acute fracture or mandibular dislocation. Orbits: Swelling superficial to the right orbit. No visible globe or postseptal injury. Bilateral cataract resection. 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 Sinuses: Chronic sinusitis with opacified left maxillary, anterior ethmoid, and frontal sinuses. Left maxillary wall is sclerotic and thickened with a broad dehiscence along the posterolateral wall that is traversed by partially calcified material. No sinus mass was seen on a April 07, 2009 brain MRI, but dehiscence had not been noted at that time either. Soft tissues: Hazy retro antral fat on the left, stable where covered on prior head CT 01/25/2020. CT CERVICAL SPINE FINDINGS Alignment: No traumatic malalignment. Skull base and vertebrae: Negative for cervical spine fracture. T1 and T2 superior endplate fractures with trabecular impaction, nonacute in seen on a 2021 chest CT. Soft tissues and spinal canal: No prevertebral fluid or swelling. No visible canal hematoma. Mildly enlarged lymph nodes in the upper right jugular chain, posterior to the internal jugular vein, unchanged from November 2021 and thus presumably reactive Disc levels: Multilevel degenerative facet spurring asymmetric to the right. Ordinary mid and lower  cervical disc degeneration. Upper chest: Layering right pleural effusion, small where covered. Critical Value/emergent results were called by telephone at the time of interpretation on 03/20/2020 at 8:00 am to provider Lavonia Drafts , who verbally acknowledged these results. IMPRESSION: 1. Small volume subarachnoid and possibly subdural hemorrhage along the superficial left temporal lobe. No significant mass effect. 2. Scalp and facial swelling without acute fracture. 3. Negative for cervical spine fracture. 4. 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 ENT referral is recommended if appropriate for comorbidities. Electronically Signed   By: Monte Fantasia M.D.   On: 03/20/2020 08:07    ____________________________________________   PROCEDURES  Procedure(s) performed (including Critical Care):  .Critical Care Performed by: Jari Pigg,  Royetta Crochet, MD Authorized by: Vanessa [place], MD   Critical care provider statement:    Critical care time (minutes):  45   Critical care was necessary to treat or prevent imminent or life-threatening deterioration of the following conditions:  CNS failure or compromise   Critical care was time spent personally by me on the following activities:  Discussions with consultants, evaluation of patient's response to treatment, examination of patient, ordering and performing treatments and interventions, ordering and review of laboratory studies, ordering and review of radiographic studies, pulse oximetry, re-evaluation of patient's condition, obtaining history from patient or surrogate and review of old charts     ____________________________________________   INITIAL IMPRESSION / ASSESSMENT AND PLAN / ED COURSE    Kyle [name] was evaluated in Emergency Department on 03/20/2020 for the symptoms  described in the history of present illness. He was evaluated in the context of the global COVID-19 pandemic, which necessitated consideration that the patient might be at risk for infection with the SARS-CoV-2 virus that causes COVID-19. Institutional protocols and algorithms that pertain to the evaluation of patients at risk for COVID-19 are in a state of rapid change based on information released by regulatory bodies including the CDC and federal and state organizations. These policies and algorithms were followed during the patient's care in the ED.    Unclear if patient had syncopal episode and then fell versus him falling and causing head bleed.  Will get CT head evaluate for intracranial hemorrhage, CT cervical evaluate for cervical fracture and CT face to evaluate for facial fracture.  His right eye he does have a subconjunctival hemorrhage but his vision is intact and extraocular movements are intact.  Low suspicion for orbital fracture or retrobulbar bulbar hematoma but will confirm with CT imaging.  Will get labs given patient is a dialysis patient.  Patient reports he was last dialyzed on Friday.  CT head is concerning for small subarachnoid versus subdural.  Difficult to know again if the subarachnoid because the fall versus a traumatic subarachnoid.  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  Appears this is new from prior.  Discussed with Dr. Lacinda Axon who would like a CTA head and neck.  If negative can admit patient to our hospital for repeat CT head in 6 hours.  Also wants an INR.  If CT is negative we will keep patient here.  Holding on platelets at this time  Discussed the hospital team for admission for possible syncopal work-up.  ____________________________________________   FINAL CLINICAL IMPRESSION(S) / ED DIAGNOSES   Final diagnoses:  Fall, initial encounter  Subdural hematoma ([place])  Subarachnoid hematoma with loss of consciousness, initial encounter ([place])      MEDICATIONS  GIVEN DURING THIS VISIT:  Medications  atorvastatin (LIPITOR) tablet 10 mg (has no administration in time range)  metoprolol succinate (TOPROL-XL) 24 hr tablet 50 mg (has no administration in time range)  traZODone (DESYREL) tablet 100 mg (has no administration in time range)  levothyroxine (SYNTHROID) tablet 200 mcg (has no administration in time range)  calcium acetate (PHOSLO) capsule 1,334 mg (has no administration in time range)  lipase/protease/amylase (CREON) capsule 24,000 Units (has no administration in time range)  pantoprazole (PROTONIX) EC tablet 40 mg (has no administration in time range)  gabapentin (NEURONTIN) capsule 100 mg (has no administration in time range)  levETIRAcetam (KEPPRA) tablet 500 mg (has no administration in time range)  b complex-vitamin c-folic acid (NEPHRO-VITE) tablet 1 tablet (has no administration in time range)  lidocaine-prilocaine (EMLA) cream 1 application (has no administration in time range)  sodium chloride flush (NS) 0.9 % injection 3 mL (has no administration in time range)  acetaminophen (TYLENOL) tablet 650 mg (has no administration in time range)    Or  acetaminophen (TYLENOL) suppository 650 mg (has no administration in time range)  iohexol (OMNIPAQUE) 350 MG/ML injection 75 mL (75 mLs Intravenous Contrast Given 03/20/20 4585)     ED Discharge Orders    None       Note:  This document was prepared using Dragon voice recognition software and may include unintentional dictation errors.   Vanessa [place], MD 03/20/20 [phone]

## 2020-03-20 NOTE — ED Triage Notes (Signed)
Pt to ED via [facility] from home for unwitnessed fall with positive LOC. Pt is A & O x4, pt states that he does not remember how he fell. Pt does take blood thinners. Pt has hematoma to right eye, pt currently in C collar. Pt has skin tear to right hand.

## 2020-03-20 NOTE — Progress Notes (Signed)
PHARMACY NOTE:  ANTIMICROBIAL RENAL DOSAGE ADJUSTMENT  Current antimicrobial regimen includes a mismatch between antimicrobial dosage and estimated renal function.  As per policy approved by the Pharmacy & Therapeutics and Medical Executive Committees, the antimicrobial dosage will be adjusted accordingly.  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 Discussed with ENT Dr. Richardson Landry who recommended empiric antibiotic coverage with Augmentin  Renal Function:  Estimated Creatinine Clearance: 11 mL/min (A) (by C-G formula based on SCr of 6.78 mg/dL (H)). [x]      On intermittent HD, scheduled: MWF     Antimicrobial dosage has been changed to:  Augmentin 500mg  q24h in the evening (to be able to give after HD on HD days)  Additional comments:   Thank you for allowing pharmacy to be a part of this patient's care.  Tyhir Schwan A, [facility] 03/20/2020 2:09 PM

## 2020-03-20 NOTE — Progress Notes (Signed)
[facility]  ROUNDING NOTE   Subjective:   The patient reports that he had a fall at home this morning.  He is complaining of a headache.  Denies any shortness of breath, edema. Reports that he went to dialysis on Friday but it was a shortend schedule due to the weather. He missed treatment on Monday due to the weather.    Objective:  Vital signs in last 24 hours:  Temp:  [97.9 F (36.6 C)] 97.9 F (36.6 C) (01/23 0705) Pulse Rate:  [71-78] 78 (01/23 1009) Resp:  [16] 16 (01/23 1009) BP: (165-167)/(71-87) 167/87 (01/23 1009) SpO2:  [96 %-97 %] 97 % (01/23 1009) Weight:  [79.8 kg] 79.8 kg (01/23 0705)  Weight change:  Filed Weights   03/20/20 0705  Weight: 79.8 kg    Intake/Output: No intake/output data recorded.   Intake/Output this shift:  No intake/output data recorded.  Physical Exam: General: NAD  Head: Normocephalic, atraumatic. Moist oral mucosal membranes  Eyes: Anicteric, PERRL, large ecchymosis around right eye   Neck: Supple, trachea midline  Lungs:  Clear to auscultation  Heart: Regular rate and rhythm  Abdomen:  Soft, nontender,   Extremities:  No peripheral edema.  Neurologic: Nonfocal, moving all four extremities  Skin: abrasions on his right arm   Access: Left upper extremity AVF, +thrill      Basic Metabolic Panel: Recent Labs  Lab 03/15/20 1653 03/15/20 2230 03/16/20 0241 03/16/20 0520 03/16/20 1202 03/16/20 2117 03/17/20 0340 03/20/20 0717  NA 134*  --   --  137  --   --  137 138  K 6.1*   < > 4.3 4.6 4.8  --  4.0 4.3  CL 93*  --   --  97*  --   --  96* 98  CO2 20*  --   --  22  --   --  28 26  GLUCOSE 200*  --   --  105*  --   --  123* 209*  BUN 50*  --   --  53*  --   --  32* 30*  CREATININE 8.64*  --   --  9.06*  --   --  6.20* 6.78*  CALCIUM 6.9*  --   --  6.7*  --   --  7.8* 7.0*  PHOS  --   --   --   --   --  4.8*  --   --    < > = values in this interval not displayed.    Liver Function Tests: Recent Labs   Lab 03/17/20 0340  AST 35  ALT 30  ALKPHOS 110  BILITOT 1.0  PROT 6.8  ALBUMIN 3.4*   No results for input(s): LIPASE, AMYLASE in the last 168 hours. No results for input(s): AMMONIA in the last 168 hours.  CBC: Recent Labs  Lab 03/15/20 1653 03/16/20 0520 03/17/20 0340 03/20/20 0717  WBC 7.2 5.7 4.8 5.4  NEUTROABS 5.3  --   --   --   HGB 10.8* 10.1* 11.0* 10.8*  HCT 35.5* 31.3* 34.7* 33.4*  MCV 101.1* 97.5 98.0 99.4  PLT 170 144* 151 140*    Cardiac Enzymes: Recent Labs  Lab 03/17/20 0340  CKTOTAL 95    BNP: Invalid input(s): POCBNP  CBG: Recent Labs  Lab 03/15/20 2109 03/15/20 2227 03/17/20 1213  GLUCAP 120* 106* 120*    Microbiology: Results for orders placed or performed during the hospital encounter of 03/20/20  SARS Coronavirus  2 by RT PCR (hospital order, performed in [facility] hospital lab) Nasopharyngeal Nasopharyngeal Swab     Status: None   Collection Time: 03/20/20  9:04 AM   Specimen: Nasopharyngeal Swab  Result Value Ref Range Status   SARS Coronavirus 2 NEGATIVE NEGATIVE Final    Comment: (NOTE) SARS-CoV-2 target nucleic acids are NOT DETECTED.  The SARS-CoV-2 RNA is generally detectable in upper and lower respiratory specimens during the acute phase of infection. The lowest concentration of SARS-CoV-2 viral copies this assay can detect is 250 copies / mL. A negative result does not preclude SARS-CoV-2 infection and should not be used as the sole basis for treatment or other patient management decisions.  A negative result may occur with improper specimen collection / handling, submission of specimen other than nasopharyngeal swab, presence of viral mutation(s) within the areas targeted by this assay, and inadequate number of viral copies (<250 copies / mL). A negative result must be combined with clinical observations, patient history, and epidemiological information.  Fact Sheet for Patients:    StrictlyIdeas.no  Fact Sheet for Healthcare Providers: BankingDealers.co.za  This test is not yet approved or  cleared by the [place] FDA and has been authorized for detection and/or diagnosis of SARS-CoV-2 by FDA under an Emergency Use Authorization (EUA).  This EUA will remain in effect (meaning this test can be used) for the duration of the COVID-19 declaration under Section 564(b)(1) of the Act, 21 U.S.C. section 360bbb-3(b)(1), unless the authorization is terminated or revoked sooner.  Performed at [facility], [address] [place], [place] [address]     Coagulation Studies: Recent Labs    03/20/20 0904  LABPROT 13.6  INR 1.1    Urinalysis: No results for input(s): COLORURINE, LABSPEC, PHURINE, GLUCOSEU, HGBUR, BILIRUBINUR, KETONESUR, PROTEINUR, UROBILINOGEN, NITRITE, LEUKOCYTESUR in the last 72 hours.  Invalid input(s): APPERANCEUR    Imaging: CT Angio Head W or Wo Contrast  Result Date: 03/20/2020 CLINICAL DATA:  Head trauma with intracranial arterial injury suspected EXAM: CT ANGIOGRAPHY HEAD AND NECK TECHNIQUE: Multidetector CT imaging of the head and neck was performed using the standard protocol during bolus administration of intravenous contrast. Multiplanar CT image reconstructions and MIPs were obtained to evaluate the vascular anatomy. Carotid stenosis measurements (when applicable) are obtained utilizing NASCET criteria, using the distal internal carotid diameter as the denominator. CONTRAST:  55mL OMNIPAQUE IOHEXOL 350 MG/ML SOLN COMPARISON:  Head CT from earlier today. FINDINGS: CTA NECK FINDINGS Aortic arch: Atheromatous calcification.  No aneurysm or dissection Right carotid system: Atheromatous wall thickening with calcification at the bifurcation. No stenosis or ulceration. Left carotid system: Atheromatous wall thickening of the common carotid. No flow limiting stenosis or ulceration.  Vertebral arteries: No proximal subclavian stenosis. There are codominant vertebral arteries that are smooth and widely patent to the dura. Skeleton: No acute finding. Other neck: Left-sided sinusitis with OMU obstructive pattern and cortical breakthrough at the posterior wall left maxillary sinus, described on preceding head CT. Upper chest: Moderate, layering right pleural effusion. Generous mediastinal lymph nodes, likely reactive. Review of the MIP images confirms the above findings CTA HEAD FINDINGS Anterior circulation: Heavily calcified carotid siphons with up to 50% stenosis on the right. No branch occlusion, beading, aneurysm, or evidence of vascular malformation. Broad appearance at the anterior communicating artery without discrete aneurysm. Essentially azygos A2 segment. Posterior circulation: Codominant vertebral arteries. Atheromatous plaque asymmetric to the left V4 segment. The vertebral and basilar arteries are smooth and widely patent. Fetal type left  PCA. Negative for aneurysm or vascular malformation Venous sinuses: Unremarkable Anatomic variants: As above Review of the MIP images confirms the above findings IMPRESSION: 1. No arterial cause/injury seen underlying the traumatic pattern subarachnoid hemorrhage. 2. Cervical and intracranial carotid atherosclerosis with up to 50% stenosis at the right cavernous ICA. 3. Layering right pleural effusion. 4. Complicated left sinusitis as previously described. Electronically Signed   By: Monte Fantasia M.D.   On: 03/20/2020 10:13   DG Chest 2 View  Result Date: 03/20/2020 CLINICAL DATA:  Assess for pleural effusion. EXAM: CHEST - 2 VIEW COMPARISON:  March 15, 2020 FINDINGS: The mediastinal contour and cardiac silhouette are stable. Heart size is enlarged. Mild increased pulmonary interstitium is identified bilaterally unchanged. There is minimal right pleural effusion. There is no left pleural effusion. The bony structures are stable. IMPRESSION:  Minimal right pleural effusion. Electronically Signed   By: Abelardo Diesel M.D.   On: 03/20/2020 11:16   CT Head Wo Contrast  Result Date: 03/20/2020 CLINICAL DATA:  Fall with facial trauma EXAM: CT HEAD WITHOUT CONTRAST CT MAXILLOFACIAL WITHOUT CONTRAST CT CERVICAL SPINE WITHOUT CONTRAST TECHNIQUE: Multidetector CT imaging of the head, cervical spine, and maxillofacial structures were performed using the standard protocol without intravenous contrast. Multiplanar CT image reconstructions of the cervical spine and maxillofacial structures were also generated. COMPARISON:  01/25/2020 FINDINGS: CT HEAD FINDINGS Brain: High-density/acute hemorrhage in the subarachnoid and possibly subdural spaces over the lateral left temporal lobe, in total measuring up to 3 mm in thickness. No associated mass effect. No visible parenchymal hemorrhage or swelling. Brain atrophy and chronic small vessel ischemia. No evidence of acute infarct. 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 Vascular: No hyperdense vessel or unexpected calcification. Skull: Posterior scalp swelling without calvarial fracture. CT MAXILLOFACIAL FINDINGS Osseous: No acute fracture or mandibular dislocation. Orbits: Swelling superficial to the right orbit. No visible globe or postseptal injury. Bilateral cataract resection. 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 Sinuses: Chronic sinusitis with opacified left maxillary, anterior ethmoid, and frontal sinuses. Left maxillary wall is sclerotic and thickened with a broad dehiscence along the posterolateral wall that is traversed by partially calcified material. No sinus mass was seen on a April 07, 2009 brain MRI, but dehiscence had not been noted at that time either. Soft tissues: Hazy retro antral fat on the left, stable where covered on prior head CT 01/25/2020. CT CERVICAL SPINE FINDINGS Alignment: No traumatic malalignment. Skull base and vertebrae:  Negative for cervical spine fracture. T1 and T2 superior endplate fractures with trabecular impaction, nonacute in seen on a 2021 chest CT. Soft tissues and spinal canal: No prevertebral fluid or swelling. No visible canal hematoma. Mildly enlarged lymph nodes in the upper right jugular chain, posterior to the internal jugular vein, unchanged from November 2021 and thus presumably reactive Disc levels: Multilevel degenerative facet spurring asymmetric to the right. Ordinary mid and lower cervical disc degeneration. Upper chest: Layering right pleural effusion, small where covered. Critical Value/emergent results were called by telephone at the time of interpretation on 03/20/2020 at 8:00 am to provider Lavonia Drafts , who verbally acknowledged these results. IMPRESSION: 1. Small volume subarachnoid and possibly subdural hemorrhage along the superficial left temporal lobe. No significant mass effect. 2. Scalp and facial swelling without acute fracture. 3. Negative for cervical spine fracture. 4. 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 Complicated disease at the left maxillary sinus with chronic defect in the posterior wall that does not appear progressed  from November 2021 but is new from January 2021. ENT referral is recommended if appropriate for comorbidities. Electronically Signed   By: Monte Fantasia M.D.   On: 03/20/2020 08:07   CT Angio Neck W and/or Wo Contrast  Result Date: 03/20/2020 CLINICAL DATA:  Head trauma with intracranial arterial injury suspected EXAM: CT ANGIOGRAPHY HEAD AND NECK TECHNIQUE: Multidetector CT imaging of the head and neck was performed using the standard protocol during bolus administration of intravenous contrast. Multiplanar CT image reconstructions and MIPs were obtained to evaluate the vascular anatomy. Carotid stenosis measurements (when applicable) are obtained utilizing NASCET criteria, using the distal internal carotid diameter as the denominator. CONTRAST:  16mL  OMNIPAQUE IOHEXOL 350 MG/ML SOLN COMPARISON:  Head CT from earlier today. FINDINGS: CTA NECK FINDINGS Aortic arch: Atheromatous calcification.  No aneurysm or dissection Right carotid system: Atheromatous wall thickening with calcification at the bifurcation. No stenosis or ulceration. Left carotid system: Atheromatous wall thickening of the common carotid. No flow limiting stenosis or ulceration. Vertebral arteries: No proximal subclavian stenosis. There are codominant vertebral arteries that are smooth and widely patent to the dura. Skeleton: No acute finding. Other neck: Left-sided sinusitis with OMU obstructive pattern and cortical breakthrough at the posterior wall left maxillary sinus, described on preceding head CT. Upper chest: Moderate, layering right pleural effusion. Generous mediastinal lymph nodes, likely reactive. Review of the MIP images confirms the above findings CTA HEAD FINDINGS Anterior circulation: Heavily calcified carotid siphons with up to 50% stenosis on the right. No branch occlusion, beading, aneurysm, or evidence of vascular malformation. Broad appearance at the anterior communicating artery without discrete aneurysm. Essentially azygos A2 segment. Posterior circulation: Codominant vertebral arteries. Atheromatous plaque asymmetric to the left V4 segment. The vertebral and basilar arteries are smooth and widely patent. Fetal type left PCA. Negative for aneurysm or vascular malformation Venous sinuses: Unremarkable Anatomic variants: As above Review of the MIP images confirms the above findings IMPRESSION: 1. No arterial cause/injury seen underlying the traumatic pattern subarachnoid hemorrhage. 2. Cervical and intracranial carotid atherosclerosis with up to 50% stenosis at the right cavernous ICA. 3. Layering right pleural effusion. 4. Complicated left sinusitis as previously described. Electronically Signed   By: Monte Fantasia M.D.   On: 03/20/2020 10:13   CT Cervical Spine Wo  Contrast  Result Date: 03/20/2020 CLINICAL DATA:  Fall with facial trauma EXAM: CT HEAD WITHOUT CONTRAST CT MAXILLOFACIAL WITHOUT CONTRAST CT CERVICAL SPINE WITHOUT CONTRAST TECHNIQUE: Multidetector CT imaging of the head, cervical spine, and maxillofacial structures were performed using the standard protocol without intravenous contrast. Multiplanar CT image reconstructions of the cervical spine and maxillofacial structures were also generated. COMPARISON:  01/25/2020 FINDINGS: CT HEAD FINDINGS Brain: High-density/acute hemorrhage in the subarachnoid and possibly subdural spaces over the lateral left temporal lobe, in total measuring up to 3 mm in thickness. No associated mass effect. No visible parenchymal hemorrhage or swelling. Brain atrophy and chronic small vessel ischemia. No evidence of acute infarct. 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 Vascular: No hyperdense vessel or unexpected calcification. Skull: Posterior scalp swelling without calvarial fracture. CT MAXILLOFACIAL FINDINGS Osseous: No acute fracture or mandibular dislocation. Orbits: Swelling superficial to the right orbit. No visible globe or postseptal injury. Bilateral cataract resection. 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 Sinuses: Chronic sinusitis with opacified left maxillary, anterior ethmoid, and frontal sinuses. Left maxillary wall is sclerotic and thickened with a broad dehiscence along the posterolateral wall that is traversed by partially  calcified material. No sinus mass was seen on a April 07, 2009 brain MRI, but dehiscence had not been noted at that time either. Soft tissues: Hazy retro antral fat on the left, stable where covered on prior head CT 01/25/2020. CT CERVICAL SPINE FINDINGS Alignment: No traumatic malalignment. Skull base and vertebrae: Negative for cervical spine fracture. T1 and T2 superior endplate fractures with trabecular impaction, nonacute in  seen on a 2021 chest CT. Soft tissues and spinal canal: No prevertebral fluid or swelling. No visible canal hematoma. Mildly enlarged lymph nodes in the upper right jugular chain, posterior to the internal jugular vein, unchanged from November 2021 and thus presumably reactive Disc levels: Multilevel degenerative facet spurring asymmetric to the right. Ordinary mid and lower cervical disc degeneration. Upper chest: Layering right pleural effusion, small where covered. Critical Value/emergent results were called by telephone at the time of interpretation on 03/20/2020 at 8:00 am to provider Lavonia Drafts , who verbally acknowledged these results. IMPRESSION: 1. Small volume subarachnoid and possibly subdural hemorrhage along the superficial left temporal lobe. No significant mass effect. 2. Scalp and facial swelling without acute fracture. 3. Negative for cervical spine fracture. 4. 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 ENT referral is recommended if appropriate for comorbidities. Electronically Signed   By: Monte Fantasia M.D.   On: 03/20/2020 08:07   CT Maxillofacial Wo Contrast  Result Date: 03/20/2020 CLINICAL DATA:  Fall with facial trauma EXAM: CT HEAD WITHOUT CONTRAST CT MAXILLOFACIAL WITHOUT CONTRAST CT CERVICAL SPINE WITHOUT CONTRAST TECHNIQUE: Multidetector CT imaging of the head, cervical spine, and maxillofacial structures were performed using the standard protocol without intravenous contrast. Multiplanar CT image reconstructions of the cervical spine and maxillofacial structures were also generated. COMPARISON:  01/25/2020 FINDINGS: CT HEAD FINDINGS Brain: High-density/acute hemorrhage in the subarachnoid and possibly subdural spaces over the lateral left temporal lobe, in total measuring up to 3 mm in thickness. No  associated mass effect. No visible parenchymal hemorrhage or swelling. Brain atrophy and chronic small vessel ischemia. No evidence of acute infarct. 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 Vascular: No hyperdense vessel or unexpected calcification. Skull: Posterior scalp swelling without calvarial fracture. CT MAXILLOFACIAL FINDINGS Osseous: No acute fracture or mandibular dislocation. Orbits: Swelling superficial to the right orbit. No visible globe or postseptal injury. Bilateral cataract resection. 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 Sinuses: Chronic sinusitis with opacified left maxillary, anterior ethmoid, and frontal sinuses. Left maxillary wall is sclerotic and thickened with a broad dehiscence along the posterolateral wall that is traversed by partially calcified material. No sinus mass was seen on a April 07, 2009 brain MRI, but dehiscence had not been noted at that time either. Soft tissues: Hazy retro antral fat on the left, stable where covered on prior head CT 01/25/2020. CT CERVICAL SPINE FINDINGS Alignment: No traumatic malalignment. Skull base and vertebrae: Negative for cervical spine fracture. T1 and T2 superior endplate fractures with trabecular impaction, nonacute in seen on a 2021 chest CT. Soft tissues and spinal canal: No prevertebral fluid or swelling. No visible canal hematoma. Mildly enlarged lymph nodes in the upper right jugular chain, posterior to the internal jugular vein, unchanged from November 2021 and thus presumably reactive Disc levels: Multilevel degenerative facet spurring asymmetric to the right. Ordinary mid and lower cervical disc degeneration.  Upper chest: Layering right pleural effusion, small where covered. Critical Value/emergent results were called by telephone at the time of interpretation on 03/20/2020 at 8:00 am to provider Lavonia Drafts , who verbally acknowledged these results. IMPRESSION: 1. Small  volume subarachnoid and possibly subdural hemorrhage along the superficial left temporal lobe. No significant mass effect. 2. Scalp and facial swelling without acute fracture. 3. Negative for cervical spine fracture. 4. 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 ENT referral is recommended if appropriate for comorbidities. Electronically Signed   By: Monte Fantasia M.D.   On: 03/20/2020 08:07     Medications:    . amoxicillin-clavulanate  1 tablet Oral Q24H  . atorvastatin  10 mg Oral Daily  . b complex-vitamin c-folic acid  1 tablet Oral Daily  . calcium acetate  1,334 mg Oral TID WC  . gabapentin  100 mg Oral BID  . insulin aspart  0-6 Units Subcutaneous TID WC  . levETIRAcetam  500 mg Oral BID  . [START ON 03/21/2020] levothyroxine  200 mcg Oral QAC breakfast  . [START ON 03/22/2020] lidocaine-prilocaine  1 application Topical Q T,Th,Sa-HD  . lipase/protease/amylase  24,000 Units Oral TID WC  . metoprolol succinate  50 mg Oral Daily  . pantoprazole  40 mg Oral Daily  . sodium chloride flush  3 mL Intravenous Q12H  . traZODone  100 mg Oral QHS   acetaminophen **OR** acetaminophen  Assessment/ Plan:  [name] is a 70 y.o.  male  past medical history of CVA with residual left-sided weakness, ESRD on HD MWF, seizure disorder, diabetes mellitus, hypertension, anemia of chronic kidney disease, secondary hyperparathyroidism who was admitted post fall.   1.  ESRD on HD MWF.   - Patient had dialysis on Friday outpatient  - no acute indication for HD today: euvolemic, k 4.3  - plan to resume HD on regular schedule.   2.  Anemia of chronic kidney disease.   - on epogen as an outpatient  - hgb 10.8  3.  Secondary hyperparathyroidism.  -  Continue calcium acetate 2 tablets p.o. 3 times daily with  meals.  4.  Hypertension.  Blood pressure currently eleavted  - on metoprolol as outpatient    LOS: 0 Kyle [name] P Jolee Critcher 1/23/20221:27 PM

## 2020-03-20 NOTE — ED Notes (Signed)
Update given to wife per request.

## 2020-03-20 NOTE — Consult Note (Signed)
[facility]iology Consultation Note  Patient ID: Kyle [name], MRN: [id number], DOB/AGE: 1951-01-30 70 y.o. Admit date: 03/20/2020   Date of Consult: 03/20/2020 Primary Physician: Idelle Crouch, MD Primary Cardiologist: None  Chief Complaint:  Chief Complaint  Patient presents with  . Fall   Reason for Consult: Syncope  HPI: 70 y.o. male with known end-stage renal disease on dialysis chronic anemia with a hemoglobin of 10.8 previous cerebrovascular accident peripheral vascular disease sleep apnea diabetes having recurrent episodes of falls.  It is unclear whether these falls are syncope or anything else.  He has had multiple episodes where he has fallen and has seizure-like activity.  EEG has shown no evidence of significant seizure activity.  In addition to that the patient also has had vascular evaluation with head CT and other angiography showing no evidence of critical carotid atherosclerosis.  The patient had an episode where he fell and does not remember what happened when he was walking with his walker.  He has a large hematoma around his right eye.  Currently there is been no evidence of chest pain PND orthopnea dizziness nausea or diaphoresis.  He does have known peripheral vascular disease with hypertension and lipid management appropriately at this time.  Currently he has had an EKG showing normal sinus rhythm left axis deviation and right bundle branch block chest x-ray showing a small right pleural effusion.  Previous echocardiogram showed no evidence of significant abnormalities with normal LV systolic function and no evidence of significant valvular heart disease  Past Medical History:  Diagnosis Date  . Anemia   . Anxiety   . BPH (benign prostatic hyperplasia)   . Chronic kidney disease   . Colon polyps   . COPD (chronic obstructive pulmonary disease) ([place])   . Depression   . Diabetes mellitus without complication ([place])   . Diverticulosis of colon   .  History of kidney stones   . Hyperlipidemia   . Hypertension   . Hypothyroidism   . Nephrolithiasis   . Seizures ([place])    2015 last seizure  . Stroke [facility])       Surgical History:  Past Surgical History:  Procedure Laterality Date  . AV FISTULA PLACEMENT Left 03/30/2019   Procedure: right brachiocephalic fistula creation;  Surgeon: Marty Heck, MD;  Location: [place];  Service: Vascular;  Laterality: Left;  . BIOPSY  04/05/2019   Procedure: BIOPSY;  Surgeon: Wilford Corner, MD;  Location: [place];  Service: Endoscopy;;  . COLONOSCOPY    . COLONOSCOPY WITH PROPOFOL N/A 05/30/2015   Procedure: COLONOSCOPY WITH PROPOFOL;  Surgeon: Manya Silvas, MD;  Location: [facility] ENDOSCOPY;  Service: Endoscopy;  Laterality: N/A;  . COLONOSCOPY WITH PROPOFOL N/A 12/01/2018   Procedure: COLONOSCOPY WITH PROPOFOL;  Surgeon: Toledo, [name], MD;  Location: [facility] ENDOSCOPY;  Service: Gastroenterology;  Laterality: N/A;  . COLONOSCOPY WITH PROPOFOL N/A 04/09/2019   Procedure: COLONOSCOPY WITH PROPOFOL;  Surgeon: Carol Ada, MD;  Location: [place];  Service: Endoscopy;  Laterality: N/A;  . ENTEROSCOPY N/A 05/05/2019   Procedure: ENTEROSCOPY;  Surgeon: Ronnette Juniper, MD;  Location: [facility] ENDOSCOPY;  Service: Gastroenterology;  Laterality: N/A;  Push enteroscopy  . ENTEROSCOPY N/A 07/28/2019   Procedure: ENTEROSCOPY;  Surgeon: Virgel Manifold, MD;  Location: [facility] ENDOSCOPY;  Service: Endoscopy;  Laterality: N/A;  . ESOPHAGOGASTRODUODENOSCOPY N/A 03/17/2020   Procedure: ESOPHAGOGASTRODUODENOSCOPY (EGD);  Surgeon: Lesly Rubenstein, MD;  Location: [facility] ENDOSCOPY;  Service: Endoscopy;  Laterality: N/A;  . ESOPHAGOGASTRODUODENOSCOPY (  EGD) WITH PROPOFOL N/A 04/05/2019   Procedure: ESOPHAGOGASTRODUODENOSCOPY (EGD) WITH PROPOFOL;  Surgeon: Wilford Corner, MD;  Location: [place];  Service: Endoscopy;  Laterality: N/A;  . GIVENS CAPSULE STUDY N/A 05/01/2019   Procedure: GIVENS CAPSULE STUDY;   Surgeon: Ronald Lobo, MD;  Location: [place];  Service: Endoscopy;  Laterality: N/A;  . HOT HEMOSTASIS N/A 04/09/2019   Procedure: HOT HEMOSTASIS (ARGON PLASMA COAGULATION/BICAP);  Surgeon: Carol Ada, MD;  Location: [place];  Service: Endoscopy;  Laterality: N/A;  . HOT HEMOSTASIS N/A 05/05/2019   Procedure: HOT HEMOSTASIS (ARGON PLASMA COAGULATION/BICAP);  Surgeon: Ronnette Juniper, MD;  Location: [place];  Service: Gastroenterology;  Laterality: N/A;  . INSERTION OF DIALYSIS CATHETER Right 03/30/2019   Procedure: Ultrasound guided right internal jugular tunneled dialysis catheter placement;  Surgeon: Marty Heck, MD;  Location: [facility] OR;  Service: Vascular;  Laterality: Right;  . kidney stone    . REMOVAL OF A DIALYSIS CATHETER Right 06/26/2019   Procedure: REMOVAL OF A DIALYSIS CATHETER ( PD CATH REMOVAL AND I J CATH REMOVAL;  Surgeon: Katha Cabal, MD;  Location: [facility] ORS;  Service: Vascular;  Laterality: Right;  . THYROIDECTOMY    . VARICOCELE EXCISION    . VIDEO BRONCHOSCOPY Bilateral 05/25/2016   Procedure: VIDEO BRONCHOSCOPY WITHOUT FLUORO;  Surgeon: Juanito Doom, MD;  Location: [facility] ENDOSCOPY;  Service: Cardiopulmonary;  Laterality: Bilateral;     Home Meds: Prior to Admission medications   Medication Sig Start Date End Date Taking? Authorizing Provider  atorvastatin (LIPITOR) 10 MG tablet Take 10 mg by mouth daily. 01/08/20   [provider]  B Complex-C-Folic Acid (NEPHRO-VITE PO) Take 1 tablet by mouth daily.    [provider]  calcium acetate (PHOSLO) 667 MG capsule Take 2 capsules (1,334 mg total) by mouth 3 (three) times daily with meals. 03/20/19   Loletha Grayer, MD  clopidogrel (PLAVIX) 75 MG tablet Take 75 mg by mouth daily.    [provider]  escitalopram (LEXAPRO) 10 MG tablet Take 10 mg by mouth daily. 07/14/19   [provider]  gabapentin (NEURONTIN) 100 MG capsule Take 1 capsule (100 mg total) by mouth 2  (two) times daily. 01/28/20   Pokhrel, Corrie Mckusick, MD  levETIRAcetam (KEPPRA) 500 MG tablet Take 500 mg by mouth 2 (two) times daily. 03/09/20   [provider]  levothyroxine (SYNTHROID) 200 MCG tablet Take 200 mcg by mouth daily before breakfast.  02/25/19   [provider]  lidocaine-prilocaine (EMLA) cream Apply 1 application topically Every Tuesday,Thursday,and Saturday with dialysis.     [provider]  metoprolol succinate (TOPROL-XL) 50 MG 24 hr tablet Take 50 mg by mouth daily. 02/22/20   [provider]  Pancrelipase, Lip-Prot-Amyl, 24000-76000 units CPEP Take 1 capsule by mouth 3 (three) times daily with meals.     [provider]  pantoprazole (PROTONIX) 40 MG tablet Take 40 mg by mouth daily.    [provider]    Inpatient Medications:  . amoxicillin-clavulanate  1 tablet Oral Q24H  . atorvastatin  10 mg Oral Daily  . b complex-vitamin c-folic acid  1 tablet Oral Daily  . calcium acetate  1,334 mg Oral TID WC  . gabapentin  100 mg Oral BID  . insulin aspart  0-6 Units Subcutaneous TID WC  . levETIRAcetam  500 mg Oral BID  . [START ON 03/21/2020] levothyroxine  200 mcg Oral QAC breakfast  . [START ON 03/22/2020] lidocaine-prilocaine  1 application Topical Q T,Th,Sa-HD  .  lipase/protease/amylase  24,000 Units Oral TID WC  . metoprolol succinate  50 mg Oral Daily  . pantoprazole  40 mg Oral Daily  . sodium chloride flush  3 mL Intravenous Q12H  . traZODone  100 mg Oral QHS     Allergies:  Allergies  Allergen Reactions  . Vimpat [Lacosamide] Other (See Comments)    Reports causes seizures    Social History   Socioeconomic History  . Marital status: Married    Spouse name: Not on file  . Number of children: Not on file  . Years of education: Not on file  . Highest education level: Not on file  Occupational History  . Not on file  Tobacco Use  . Smoking status: Light Tobacco Smoker    Packs/day: 0.25    Years: 40.00     Pack years: 10.00    Types: Cigarettes    Last attempt to quit: 05/25/2016    Years since quitting: 3.8  . Smokeless tobacco: Never Used  Vaping Use  . Vaping Use: Never used  Substance and Sexual Activity  . Alcohol use: Not Currently  . Drug use: No  . Sexual activity: Not on file  Other Topics Concern  . Not on file  Social History Narrative  . Not on file   Social Determinants of Health   Financial Resource Strain: Not on file  Food Insecurity: Not on file  Transportation Needs: Not on file  Physical Activity: Not on file  Stress: Not on file  Social Connections: Not on file  Intimate Partner Violence: Not on file     Family History  Problem Relation Age of Onset  . Diabetes Mother   . Diabetes Maternal Grandmother   . Diabetes Maternal Grandfather   . Lung cancer Father   . Emphysema Paternal Grandfather      Review of Systems Positive for syncope Negative for: General:  chills, fever, night sweats or weight changes.  Cardiovascular: PND orthopnea positive for syncope dizziness  Dermatological skin lesions rashes Respiratory: Cough congestion Urologic: Frequent urination urination at night and hematuria Abdominal: negative for nausea, vomiting, diarrhea, bright red blood per rectum, melena, or hematemesis Neurologic: negative for visual changes, and/or hearing changes  All other systems reviewed and are otherwise negative except as noted above.  Labs: No results for input(s): CKTOTAL, CKMB, TROPONINI in the last 72 hours. Lab Results  Component Value Date   WBC 5.4 03/20/2020   HGB 10.8 (L) 03/20/2020   HCT 33.4 (L) 03/20/2020   MCV 99.4 03/20/2020   PLT 140 (L) 03/20/2020    Recent Labs  Lab 03/17/20 0340 03/20/20 0717  NA 137 138  K 4.0 4.3  CL 96* 98  CO2 28 26  BUN 32* 30*  CREATININE 6.20* 6.78*  CALCIUM 7.8* 7.0*  PROT 6.8  --   BILITOT 1.0  --   ALKPHOS 110  --   ALT 30  --   AST 35  --   GLUCOSE 123* 209*   Lab Results   Component Value Date   CHOL 130 03/16/2019   HDL 41 03/16/2019   LDLCALC 48 03/16/2019   TRIG 207 (H) 03/16/2019   No results found for: DDIMER  Radiology/Studies:  CT Angio Head W or Wo Contrast  Result Date: 03/20/2020 CLINICAL DATA:  Head trauma with intracranial arterial injury suspected EXAM: CT ANGIOGRAPHY HEAD AND NECK TECHNIQUE: Multidetector CT imaging of the head and neck was performed using the standard protocol during bolus administration of  intravenous contrast. Multiplanar CT image reconstructions and MIPs were obtained to evaluate the vascular anatomy. Carotid stenosis measurements (when applicable) are obtained utilizing NASCET criteria, using the distal internal carotid diameter as the denominator. CONTRAST:  45mL OMNIPAQUE IOHEXOL 350 MG/ML SOLN COMPARISON:  Head CT from earlier today. FINDINGS: CTA NECK FINDINGS Aortic arch: Atheromatous calcification.  No aneurysm or dissection Right carotid system: Atheromatous wall thickening with calcification at the bifurcation. No stenosis or ulceration. Left carotid system: Atheromatous wall thickening of the common carotid. No flow limiting stenosis or ulceration. Vertebral arteries: No proximal subclavian stenosis. There are codominant vertebral arteries that are smooth and widely patent to the dura. Skeleton: No acute finding. Other neck: Left-sided sinusitis with OMU obstructive pattern and cortical breakthrough at the posterior wall left maxillary sinus, described on preceding head CT. Upper chest: Moderate, layering right pleural effusion. Generous mediastinal lymph nodes, likely reactive. Review of the MIP images confirms the above findings CTA HEAD FINDINGS Anterior circulation: Heavily calcified carotid siphons with up to 50% stenosis on the right. No branch occlusion, beading, aneurysm, or evidence of vascular malformation. Broad appearance at the anterior communicating artery without discrete aneurysm. Essentially azygos A2 segment.  Posterior circulation: Codominant vertebral arteries. Atheromatous plaque asymmetric to the left V4 segment. The vertebral and basilar arteries are smooth and widely patent. Fetal type left PCA. Negative for aneurysm or vascular malformation Venous sinuses: Unremarkable Anatomic variants: As above Review of the MIP images confirms the above findings IMPRESSION: 1. No arterial cause/injury seen underlying the traumatic pattern subarachnoid hemorrhage. 2. Cervical and intracranial carotid atherosclerosis with up to 50% stenosis at the right cavernous ICA. 3. Layering right pleural effusion. 4. Complicated left sinusitis as previously described. Electronically Signed   By: Monte Fantasia M.D.   On: 03/20/2020 10:13   DG Chest 2 View  Result Date: 03/20/2020 CLINICAL DATA:  Assess for pleural effusion. EXAM: CHEST - 2 VIEW COMPARISON:  March 15, 2020 FINDINGS: The mediastinal contour and cardiac silhouette are stable. Heart size is enlarged. Mild increased pulmonary interstitium is identified bilaterally unchanged. There is minimal right pleural effusion. There is no left pleural effusion. The bony structures are stable. IMPRESSION: Minimal right pleural effusion. Electronically Signed   By: Abelardo Diesel M.D.   On: 03/20/2020 11:16   CT Head Wo Contrast  Result Date: 03/20/2020 CLINICAL DATA:  Fall with facial trauma EXAM: CT HEAD WITHOUT CONTRAST CT MAXILLOFACIAL WITHOUT CONTRAST CT CERVICAL SPINE WITHOUT CONTRAST TECHNIQUE: Multidetector CT imaging of the head, cervical spine, and maxillofacial structures were performed using the standard protocol without intravenous contrast. Multiplanar CT image reconstructions of the cervical spine and maxillofacial structures were also generated. COMPARISON:  01/25/2020 FINDINGS: CT HEAD FINDINGS Brain: High-density/acute hemorrhage in the subarachnoid and possibly subdural spaces over the lateral left temporal lobe, in total measuring up to 3 mm in thickness. No  associated mass effect. No visible parenchymal hemorrhage or swelling. Brain atrophy and chronic small vessel ischemia. No evidence of acute infarct. 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 Vascular: No hyperdense vessel or unexpected calcification. Skull: Posterior scalp swelling without calvarial fracture. CT MAXILLOFACIAL FINDINGS Osseous: No acute fracture or mandibular dislocation. Orbits: Swelling superficial to the right orbit. No visible globe or postseptal injury. Bilateral cataract resection. 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 Sinuses: Chronic sinusitis with opacified left maxillary, anterior ethmoid, and frontal sinuses. Left maxillary wall is sclerotic and thickened with a broad dehiscence along the posterolateral wall that  is traversed by partially calcified material. No sinus mass was seen on a April 07, 2009 brain MRI, but dehiscence had not been noted at that time either. Soft tissues: Hazy retro antral fat on the left, stable where covered on prior head CT 01/25/2020. CT CERVICAL SPINE FINDINGS Alignment: No traumatic malalignment. Skull base and vertebrae: Negative for cervical spine fracture. T1 and T2 superior endplate fractures with trabecular impaction, nonacute in seen on a 2021 chest CT. Soft tissues and spinal canal: No prevertebral fluid or swelling. No visible canal hematoma. Mildly enlarged lymph nodes in the upper right jugular chain, posterior to the internal jugular vein, unchanged from November 2021 and thus presumably reactive Disc levels: Multilevel degenerative facet spurring asymmetric to the right. Ordinary mid and lower cervical disc degeneration. Upper chest: Layering right pleural effusion, small where covered. Critical Value/emergent results were called by telephone at the time of interpretation on 03/20/2020 at 8:00 am to provider Lavonia Drafts , who verbally acknowledged these results. IMPRESSION: 1. Small  volume subarachnoid and possibly subdural hemorrhage along the superficial left temporal lobe. No significant mass effect. 2. Scalp and facial swelling without acute fracture. 3. Negative for cervical spine fracture. 4. 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 ENT referral is recommended if appropriate for comorbidities. Electronically Signed   By: Monte Fantasia M.D.   On: 03/20/2020 08:07   CT Angio Neck W and/or Wo Contrast  Result Date: 03/20/2020 CLINICAL DATA:  Head trauma with intracranial arterial injury suspected EXAM: CT ANGIOGRAPHY HEAD AND NECK TECHNIQUE: Multidetector CT imaging of the head and neck was performed using the standard protocol during bolus administration of intravenous contrast. Multiplanar CT image reconstructions and MIPs were obtained to evaluate the vascular anatomy. Carotid stenosis measurements (when applicable) are obtained utilizing NASCET criteria, using the distal internal carotid diameter as the denominator. CONTRAST:  11mL OMNIPAQUE IOHEXOL 350 MG/ML SOLN COMPARISON:  Head CT from earlier today. FINDINGS: CTA NECK FINDINGS Aortic arch: Atheromatous calcification.  No aneurysm or dissection Right carotid system: Atheromatous wall thickening with calcification at the bifurcation. No stenosis or ulceration. Left carotid system: Atheromatous wall thickening of the common carotid. No flow limiting stenosis or ulceration. Vertebral arteries: No proximal subclavian stenosis. There are codominant vertebral arteries that are smooth and widely patent to the dura. Skeleton: No acute finding. Other neck: Left-sided sinusitis with OMU obstructive pattern and cortical breakthrough at the posterior wall left maxillary sinus, described on preceding head CT. Upper chest: Moderate, layering right pleural effusion.  Generous mediastinal lymph nodes, likely reactive. Review of the MIP images confirms the above findings CTA HEAD FINDINGS Anterior circulation: Heavily calcified carotid siphons with up to 50% stenosis on the right. No branch occlusion, beading, aneurysm, or evidence of vascular malformation. Broad appearance at the anterior communicating artery without discrete aneurysm. Essentially azygos A2 segment. Posterior circulation: Codominant vertebral arteries. Atheromatous plaque asymmetric to the left V4 segment. The vertebral and basilar arteries are smooth and widely patent. Fetal type left PCA. Negative for aneurysm or vascular malformation Venous sinuses: Unremarkable Anatomic variants: As above Review of the MIP images confirms the above findings IMPRESSION: 1. No arterial cause/injury seen underlying the traumatic pattern subarachnoid hemorrhage. 2. Cervical and intracranial carotid atherosclerosis with up to 50% stenosis at the right cavernous ICA. 3. Layering right pleural effusion. 4. Complicated left sinusitis as  previously described. Electronically Signed   By: Monte Fantasia M.D.   On: 03/20/2020 10:13   CT Cervical Spine Wo Contrast  Result Date: 03/20/2020 CLINICAL DATA:  Fall with facial trauma EXAM: CT HEAD WITHOUT CONTRAST CT MAXILLOFACIAL WITHOUT CONTRAST CT CERVICAL SPINE WITHOUT CONTRAST TECHNIQUE: Multidetector CT imaging of the head, cervical spine, and maxillofacial structures were performed using the standard protocol without intravenous contrast. Multiplanar CT image reconstructions of the cervical spine and maxillofacial structures were also generated. COMPARISON:  01/25/2020 FINDINGS: CT HEAD FINDINGS Brain: High-density/acute hemorrhage in the subarachnoid and possibly subdural spaces over the lateral left temporal lobe, in total measuring up to 3 mm in thickness. No associated mass effect. No visible parenchymal hemorrhage or swelling. Brain atrophy and chronic small vessel ischemia. No  evidence of acute infarct. 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 Vascular: No hyperdense vessel or unexpected calcification. Skull: Posterior scalp swelling without calvarial fracture. CT MAXILLOFACIAL FINDINGS Osseous: No acute fracture or mandibular dislocation. Orbits: Swelling superficial to the right orbit. No visible globe or postseptal injury. Bilateral cataract resection. 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 Sinuses: Chronic sinusitis with opacified left maxillary, anterior ethmoid, and frontal sinuses. Left maxillary wall is sclerotic and thickened with a broad dehiscence along the posterolateral wall that is traversed by partially calcified material. No sinus mass was seen on a April 07, 2009 brain MRI, but dehiscence had not been noted at that time either. Soft tissues: Hazy retro antral fat on the left, stable where covered on prior head CT 01/25/2020. CT CERVICAL SPINE FINDINGS Alignment: No traumatic malalignment. Skull base and vertebrae: Negative for cervical spine fracture. T1 and T2 superior endplate fractures with trabecular impaction, nonacute in seen on a 2021 chest CT. Soft tissues and spinal canal: No prevertebral fluid or swelling. No visible canal hematoma. Mildly enlarged lymph nodes in the upper right jugular chain, posterior to the internal jugular vein, unchanged from November 2021 and thus presumably reactive Disc levels: Multilevel degenerative facet spurring asymmetric to the right. Ordinary mid and lower cervical disc degeneration. Upper chest: Layering right pleural effusion, small where covered. Critical Value/emergent results were called by telephone at the time of interpretation on 03/20/2020 at 8:00 am to provider Lavonia Drafts , who verbally acknowledged these results. IMPRESSION: 1. Small volume subarachnoid and possibly subdural hemorrhage along the superficial left temporal lobe. No significant mass effect.  2. Scalp and facial swelling without acute fracture. 3. Negative for cervical spine fracture. 4. 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 ENT referral is recommended if appropriate for comorbidities. Electronically Signed   By: Monte Fantasia M.D.   On: 03/20/2020 08:07   DG Chest Portable 1 View  Result Date: 03/15/2020 CLINICAL DATA:  Altered mental status. EXAM: PORTABLE CHEST 1 VIEW COMPARISON:  January 25, 2020 FINDINGS: Chronic appearing increased lung markings, without evidence of acute infiltrate, pleural effusion or pneumothorax. The cardiac silhouette is moderately enlarged. Is marked severity calcification of the aortic arch. Chronic right-sided rib fractures are seen. Multilevel degenerative changes are noted throughout the thoracic spine. IMPRESSION: Stable cardiomegaly with chronic appearing increased lung markings. Electronically Signed   By: Virgina Norfolk M.D.   On: 03/15/2020 20:56   CT Maxillofacial Wo Contrast  Result Date: 03/20/2020 CLINICAL DATA:  Fall with facial trauma EXAM: CT  HEAD WITHOUT CONTRAST CT MAXILLOFACIAL WITHOUT CONTRAST CT CERVICAL SPINE WITHOUT CONTRAST TECHNIQUE: Multidetector CT imaging of the head, cervical spine, and maxillofacial structures were performed using the standard protocol without intravenous contrast. Multiplanar CT image reconstructions of the cervical spine and maxillofacial structures were also generated. COMPARISON:  01/25/2020 FINDINGS: CT HEAD FINDINGS Brain: High-density/acute hemorrhage in the subarachnoid and possibly subdural spaces over the lateral left temporal lobe, in total measuring up to 3 mm in thickness. No associated mass effect. No visible parenchymal hemorrhage or swelling. Brain atrophy and chronic small vessel ischemia. No evidence of acute infarct. Small  remote cerebellar and left pontine infarcts. Vascular: No hyperdense vessel or unexpected calcification. Skull: Posterior scalp swelling without calvarial fracture. CT MAXILLOFACIAL FINDINGS Osseous: No acute fracture or mandibular dislocation. Orbits: Swelling superficial to the right orbit. No visible globe or postseptal injury. Bilateral cataract resection. 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 Sinuses: Chronic sinusitis with opacified left maxillary, anterior ethmoid, and frontal sinuses. Left maxillary wall is sclerotic and thickened with a broad dehiscence along the posterolateral wall that is traversed by partially calcified material. No sinus mass was seen on a April 07, 2009 brain MRI, but dehiscence had not been noted at that time either. Soft tissues: Hazy retro antral fat on the left, stable where covered on prior head CT 01/25/2020. CT CERVICAL SPINE FINDINGS Alignment: No traumatic malalignment. Skull base and vertebrae: Negative for cervical spine fracture. T1 and T2 superior endplate fractures with trabecular impaction, nonacute in seen on a 2021 chest CT. Soft tissues and spinal canal: No prevertebral fluid or swelling. No visible canal hematoma. Mildly enlarged lymph nodes in the upper right jugular chain, posterior to the internal jugular vein, unchanged from November 2021 and thus presumably reactive Disc levels: Multilevel degenerative facet spurring asymmetric to the right. Ordinary mid and lower cervical disc degeneration. Upper chest: Layering right pleural effusion, small where covered. Critical Value/emergent results were called by telephone at the time of interpretation on 03/20/2020 at 8:00 am to provider Lavonia Drafts , who verbally acknowledged these results. IMPRESSION: 1. Small volume subarachnoid and possibly subdural hemorrhage along the superficial left temporal lobe. No significant mass effect. 2. Scalp and facial swelling  without acute fracture. 3. Negative for cervical spine fracture. 4. 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 ENT referral is recommended if appropriate for comorbidities. Electronically Signed   By: Monte Fantasia M.D.   On: 03/20/2020 08:07    EKG: Normal sinus rhythm left axis deviation right bundle branch block  Weights: Filed Weights   03/20/20 0705  Weight: 79.8 kg     Physical Exam: Blood pressure (!) 167/87, pulse 78, temperature 97.9 F (36.6 C), temperature source Oral, resp. rate 16, height 6' (1.829 m), weight 79.8 kg, SpO2 97 %. Body mass index is 23.87 kg/m. General: Well developed, well nourished, in no acute distress. Head eyes ears nose throat: Normocephalic, atraumatic, sclera non-icteric, no xanthomas, nares are without discharge. No apparent thyromegaly and/or mass  Lungs: Normal respiratory effort.  Few wheezes, no rales, no rhonchi.  Heart: RRR with normal S1 S2. no murmur gallop, no rub, PMI is normal size and placement, carotid upstroke normal without bruit, jugular venous pressure is normal Abdomen: Soft, non-tender, non-distended with normoactive bowel sounds. No hepatomegaly. No rebound/guarding. No obvious abdominal  masses. Abdominal aorta is normal size without bruit Extremities: Trace edema. no cyanosis, no clubbing, no ulcers  Peripheral : 2+ bilateral upper extremity pulses with left arm- fistula, 2+ bilateral femoral pulses, 2+ bilateral dorsal pedal pulse Neuro: Alert and oriented. No facial asymmetry. No focal deficit. Moves all extremities spontaneously. Musculoskeletal: Normal muscle tone without kyphosis Psych:  Responds to questions appropriately with a normal affect.    Assessment: 70 year old male with end-stage renal disease on dialysis hypertension hyperlipidemia diabetes  sleep apnea cerebrovascular accident anemia and abnormal EKG with recurrent episodes of falling or syncope of unknown etiology without evidence of congestive heart failure or myocardial infarction  Plan: 1.  Continue treatment for fall and right orbital injury 2.  Continue telemetry and watch for rhythm disturbances which may give clues to his cause of syncope 3.  Continue supportive care of risk factors for cardiovascular disease including diabetes hypertension and hyperlipidemia as before 4.  Continue dialysis without restriction 5.  Further consideration Linq device placement for the possibility of recurrent syncope is caused by rhythm disturbances.  Further discussion of this after the next 24 hours  Signed, Corey Skains M.D. [facility] 03/20/2020, 1:46 PM

## 2020-03-20 NOTE — ED Notes (Signed)
Pt transported to CT ?

## 2020-03-20 NOTE — ED Triage Notes (Signed)
First Nurse: patient brought in by ems from home. Patient had an unwitnessed fall. Patient positive for LOC. Patient with swelling and bruising to left eye. Patient takes blood thinners.

## 2020-03-20 NOTE — H&P (Addendum)
History and Physical    Kyle [name] WGN:[id number] DOB: August 16, 1950 DOA: 03/20/2020  PCP: Idelle Crouch, MD   Patient coming from: Home  I have personally briefly reviewed patient's old medical records in [place]   Chief Complaint:  Fall  Most of the history was obtained from the patient's wife Kyle [name] over the phone  HPI: Kyle [name] is a 70 y.o. 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  Patient was recently discharged from the hospital about 3 days ago after he was evaluated for concerns of a GI bleed. His wife states that she heard him fall early this morning and found him on the floor over his walker. She noted that he had a huge hematoma over his right eye and she noted some blood and so she called EMS. Patient does not remember what happened which is why his wife provided the history. He denies feeling dizzy or lightheaded and denies tripping on any objects.  He had his walker prior to the fall. He complains of a headache but denies having any chest pain, no shortness of breath, no abdominal pain, no nausea, no vomiting, no palpitations, no diaphoresis, no fever, no cough, no urinary symptoms. Labs show sodium 138, potassium 4.3, chloride 98, bicarb 26, glucose 209, BUN 30, creatinine 6.78, calcium 7.0, white count 5.4, hemoglobin 10.8, hematocrit 33.4, MCV 99.4, RDW 14.5, platelet count 140, PT 13.6, INR 1.1 His SARS coronavirus 2 test is negative CT angiogram of the head and neck shows no arterial cause/injury seen underlying the traumatic pattern subarachnoid hemorrhage.Cervical and intracranial carotid atherosclerosis with up to 50% stenosis at the right cavernous ICA. Layering right pleural effusion.Complicated left sinusitis as previously described. CT  scan of the head and cervical spine shows  small volume subarachnoid and possibly subdural hemorrhage along the superficial left temporal lobe. No significant mass effect. Scalp and facial swelling without acute fracture. Negative for cervical spine fracture. 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 ENT referral is recommended if appropriate for comorbidities.     ED Course: Patient is a 70 year old Caucasian male who was recently discharged from the hospital who presents via EMS for evaluation of a possible syncopal episode.  Patient does not remember what happened but was found on the floor by his wife.  Imaging shows a small subarachnoid hemorrhage.  Patient will be admitted to the hospital for further evaluation.  Review of Systems: As per HPI otherwise all systems reviewed and negative.    Past Medical History:  Diagnosis Date  . Anemia   . Anxiety   . BPH (benign prostatic hyperplasia)   . Chronic kidney disease   . Colon polyps   . COPD (chronic obstructive pulmonary disease) ([place])   . Depression   . Diabetes mellitus without complication ([place])   . Diverticulosis of colon   . History of kidney stones   . Hyperlipidemia   . Hypertension   . Hypothyroidism   . Nephrolithiasis   . Seizures ([place])    2015 last seizure  . Stroke [facility])     Past Surgical History:  Procedure Laterality Date  . AV FISTULA PLACEMENT Left 03/30/2019  Procedure: right brachiocephalic fistula creation;  Surgeon: Marty Heck, MD;  Location: [place];  Service: Vascular;  Laterality: Left;  . BIOPSY  04/05/2019   Procedure: BIOPSY;  Surgeon: Wilford Corner, MD;  Location: [place];  Service: Endoscopy;;  . COLONOSCOPY    . COLONOSCOPY WITH PROPOFOL N/A 05/30/2015   Procedure: COLONOSCOPY WITH PROPOFOL;  Surgeon: Manya Silvas, MD;  Location: [facility] ENDOSCOPY;  Service: Endoscopy;  Laterality: N/A;  . COLONOSCOPY WITH PROPOFOL N/A 12/01/2018   Procedure: COLONOSCOPY WITH PROPOFOL;  Surgeon: Toledo, [name], MD;  Location: [facility] ENDOSCOPY;  Service: Gastroenterology;  Laterality: N/A;  . COLONOSCOPY WITH PROPOFOL N/A 04/09/2019   Procedure: COLONOSCOPY WITH PROPOFOL;  Surgeon: Carol Ada, MD;  Location: [place];  Service: Endoscopy;  Laterality: N/A;  . ENTEROSCOPY N/A 05/05/2019   Procedure: ENTEROSCOPY;  Surgeon: Ronnette Juniper, MD;  Location: [facility] ENDOSCOPY;  Service: Gastroenterology;  Laterality: N/A;  Push enteroscopy  . ENTEROSCOPY N/A 07/28/2019   Procedure: ENTEROSCOPY;  Surgeon: Virgel Manifold, MD;  Location[facility] ENDOSCOPY;  Service: Endoscopy;  Laterality: N/A;  . ESOPHAGOGASTRODUODENOSCOPY N/A 03/17/2020   Procedure: ESOPHAGOGASTRODUODENOSCOPY (EGD);  Surgeon: Lesly Rubenstein, MD;  Location: [facility] ENDOSCOPY;  Service: Endoscopy;  Laterality: N/A;  . ESOPHAGOGASTRODUODENOSCOPY (EGD) WITH PROPOFOL N/A 04/05/2019   Procedure: ESOPHAGOGASTRODUODENOSCOPY (EGD) WITH PROPOFOL;  Surgeon: Wilford Corner, MD;  Location: [place];  Service: Endoscopy;  Laterality: N/A;  . GIVENS CAPSULE STUDY N/A 05/01/2019   Procedure: GIVENS CAPSULE STUDY;  Surgeon: Ronald Lobo, MD;  Location: [place];  Service: Endoscopy;  Laterality: N/A;  . HOT HEMOSTASIS N/A 04/09/2019   Procedure: HOT HEMOSTASIS (ARGON PLASMA COAGULATION/BICAP);  Surgeon: Carol Ada, MD;  Location: [place];  Service: Endoscopy;  Laterality: N/A;  . HOT HEMOSTASIS N/A 05/05/2019   Procedure: HOT HEMOSTASIS (ARGON PLASMA COAGULATION/BICAP);  Surgeon: Ronnette Juniper, MD;  Location: [place];  Service: Gastroenterology;  Laterality: N/A;  . INSERTION OF DIALYSIS CATHETER Right 03/30/2019   Procedure: Ultrasound guided right internal jugular tunneled dialysis catheter placement;  Surgeon: Marty Heck, MD;  Location: [facility] OR;   Service: Vascular;  Laterality: Right;  . kidney stone    . REMOVAL OF A DIALYSIS CATHETER Right 06/26/2019   Procedure: REMOVAL OF A DIALYSIS CATHETER ( PD CATH REMOVAL AND I J CATH REMOVAL;  Surgeon: Katha Cabal, MD;  Location: [facility] ORS;  Service: Vascular;  Laterality: Right;  . THYROIDECTOMY    . VARICOCELE EXCISION    . VIDEO BRONCHOSCOPY Bilateral 05/25/2016   Procedure: VIDEO BRONCHOSCOPY WITHOUT FLUORO;  Surgeon: Juanito Doom, MD;  Location: [facility] ENDOSCOPY;  Service: Cardiopulmonary;  Laterality: Bilateral;     reports that he has been smoking cigarettes. He has a 10.00 pack-year smoking history. He has never used smokeless tobacco. He reports previous alcohol use. He reports that he does not use drugs.  Allergies  Allergen Reactions  . Vimpat [Lacosamide] Other (See Comments)    Reports causes seizures    Family History  Problem Relation Age of Onset  . Diabetes Mother   . Diabetes Maternal Grandmother   . Diabetes Maternal Grandfather   . Lung cancer Father   . Emphysema Paternal Grandfather      Prior to Admission medications   Medication Sig Start Date End Date Taking? Authorizing Provider  atorvastatin (LIPITOR) 10 MG tablet Take 10 mg by mouth daily. 01/08/20   [provider]  B Complex-C-Folic Acid (NEPHRO-VITE PO) Take 1 tablet by mouth daily.  [provider]  calcium acetate (PHOSLO) 667 MG capsule Take 2 capsules (1,334 mg total) by mouth 3 (three) times daily with meals. 03/20/19   Loletha Grayer, MD  clopidogrel (PLAVIX) 75 MG tablet Take 75 mg by mouth daily.    [provider]  escitalopram (LEXAPRO) 10 MG tablet Take 10 mg by mouth daily. 07/14/19   [provider]  gabapentin (NEURONTIN) 100 MG capsule Take 1 capsule (100 mg total) by mouth 2 (two) times daily. 01/28/20   Pokhrel, Corrie Mckusick, MD  levETIRAcetam (KEPPRA) 500 MG tablet Take 500 mg by mouth 2 (two) times daily. 03/09/20   [provider]   levothyroxine (SYNTHROID) 200 MCG tablet Take 200 mcg by mouth daily before breakfast.  02/25/19   [provider]  lidocaine-prilocaine (EMLA) cream Apply 1 application topically Every Tuesday,Thursday,and Saturday with dialysis.     [provider]  metoprolol succinate (TOPROL-XL) 50 MG 24 hr tablet Take 1 tablet by mouth daily. 02/22/20   [provider]  Pancrelipase, Lip-Prot-Amyl, 24000-76000 units CPEP Take 1 capsule by mouth 3 (three) times daily with meals.     [provider]  pantoprazole (PROTONIX) 40 MG tablet Take 40 mg by mouth daily.    [provider]  traZODone (DESYREL) 100 MG tablet Take 100 mg by mouth at bedtime. 07/14/19   [provider]    Physical Exam: Vitals:   03/20/20 0705 03/20/20 1009  BP: (!) 165/71 (!) 167/87  Pulse: 71 78  Resp: 16 16  Temp: 97.9 F (36.6 C)   TempSrc: Oral   SpO2: 96% 97%  Weight: 79.8 kg   Height: 6' (1.829 m)      Vitals:   03/20/20 0705 03/20/20 1009  BP: (!) 165/71 (!) 167/87  Pulse: 71 78  Resp: 16 16  Temp: 97.9 F (36.6 C)   TempSrc: Oral   SpO2: 96% 97%  Weight: 79.8 kg   Height: 6' (1.829 m)     Constitutional: NAD, alert and oriented x 3. Chronically ill appearing, right eye hematoma, bruising over his face Eyes: PERRL, lids and conjunctivae pallor ENMT: Mucous membranes are moist.  Neck: normal, supple, no masses, no thyromegaly Respiratory: clear to auscultation bilaterally, no wheezing, no crackles. Normal respiratory effort. No accessory muscle use.  Cardiovascular: Regular rate and rhythm, no murmurs / rubs / gallops. No extremity edema. 2+ pedal pulses. No carotid bruits.  Abdomen: no tenderness, no masses palpated. No hepatosplenomegaly. Bowel sounds positive.  Musculoskeletal: no clubbing / cyanosis. No joint deformity upper and lower extremities.  Skin: no rashes, lesions, ulcers.  Neurologic: Left-sided weakness from prior stroke. Generalized  weakness.  Able to move all extremities Psychiatric: Flat affect.   Labs on Admission: I have personally reviewed following labs and imaging studies  CBC: Recent Labs  Lab 03/15/20 1653 03/16/20 0520 03/17/20 0340 03/20/20 0717  WBC 7.2 5.7 4.8 5.4  NEUTROABS 5.3  --   --   --   HGB 10.8* 10.1* 11.0* 10.8*  HCT 35.5* 31.3* 34.7* 33.4*  MCV 101.1* 97.5 98.0 99.4  PLT 170 144* 151 641*   Basic Metabolic Panel: Recent Labs  Lab 03/15/20 1653 03/15/20 2230 03/16/20 0241 03/16/20 0520 03/16/20 1202 03/16/20 2117 03/17/20 0340 03/20/20 0717  NA 134*  --   --  137  --   --  137 138  K 6.1*   < > 4.3 4.6 4.8  --  4.0 4.3  CL 93*  --   --  97*  --   --  96* 98  CO2 20*  --   --  22  --   --  28 26  GLUCOSE 200*  --   --  105*  --   --  123* 209*  BUN 50*  --   --  53*  --   --  32* 30*  CREATININE 8.64*  --   --  9.06*  --   --  6.20* 6.78*  CALCIUM 6.9*  --   --  6.7*  --   --  7.8* 7.0*  PHOS  --   --   --   --   --  4.8*  --   --    < > = values in this interval not displayed.   GFR: Estimated Creatinine Clearance: 11 mL/min (A) (by C-G formula based on SCr of 6.78 mg/dL (H)). Liver Function Tests: Recent Labs  Lab 03/17/20 0340  AST 35  ALT 30  ALKPHOS 110  BILITOT 1.0  PROT 6.8  ALBUMIN 3.4*   No results for input(s): LIPASE, AMYLASE in the last 168 hours. No results for input(s): AMMONIA in the last 168 hours. Coagulation Profile: Recent Labs  Lab 03/20/20 0904  INR 1.1   Cardiac Enzymes: Recent Labs  Lab 03/17/20 0340  CKTOTAL 95   BNP (last 3 results) No results for input(s): PROBNP in the last 8760 hours. HbA1C: No results for input(s): HGBA1C in the last 72 hours. CBG: Recent Labs  Lab 03/15/20 2109 03/15/20 2227 03/17/20 1213  GLUCAP 120* 106* 120*   Lipid Profile: No results for input(s): CHOL, HDL, LDLCALC, TRIG, CHOLHDL, LDLDIRECT in the last 72 hours. Thyroid Function Tests: No results for input(s): TSH, T4TOTAL, FREET4,  T3FREE, THYROIDAB in the last 72 hours. Anemia Panel: No results for input(s): VITAMINB12, FOLATE, FERRITIN, TIBC, IRON, RETICCTPCT in the last 72 hours. Urine analysis:    Component Value Date/Time   COLORURINE YELLOW 03/22/2019 1622   APPEARANCEUR CLEAR 03/22/2019 1622   APPEARANCEUR Clear 08/08/2013 1947   LABSPEC 1.018 03/22/2019 1622   LABSPEC 1.012 08/08/2013 1947   PHURINE 5.0 03/22/2019 1622   GLUCOSEU >=500 (A) 03/22/2019 1622   GLUCOSEU 50 mg/dL 08/08/2013 1947   HGBUR NEGATIVE 03/22/2019 1622   BILIRUBINUR NEGATIVE 03/22/2019 1622   BILIRUBINUR Negative 08/08/2013 1947   KETONESUR NEGATIVE 03/22/2019 1622   PROTEINUR >=300 (A) 03/22/2019 1622   NITRITE NEGATIVE 03/22/2019 1622   LEUKOCYTESUR NEGATIVE 03/22/2019 1622   LEUKOCYTESUR Negative 08/08/2013 1947    Radiological Exams on Admission: CT Angio Head W or Wo Contrast  Result Date: 03/20/2020 CLINICAL DATA:  Head trauma with intracranial arterial injury suspected EXAM: CT ANGIOGRAPHY HEAD AND NECK TECHNIQUE: Multidetector CT imaging of the head and neck was performed using the standard protocol during bolus administration of intravenous contrast. Multiplanar CT image reconstructions and MIPs were obtained to evaluate the vascular anatomy. Carotid stenosis measurements (when applicable) are obtained utilizing NASCET criteria, using the distal internal carotid diameter as the denominator. CONTRAST:  45mL OMNIPAQUE IOHEXOL 350 MG/ML SOLN COMPARISON:  Head CT from earlier today. FINDINGS: CTA NECK FINDINGS Aortic arch: Atheromatous calcification.  No aneurysm or dissection Right carotid system: Atheromatous wall thickening with calcification at the bifurcation. No stenosis or ulceration. Left carotid system: Atheromatous wall thickening of the common carotid. No flow limiting stenosis or ulceration. Vertebral arteries: No proximal subclavian stenosis. There are codominant vertebral arteries that are smooth and widely patent to  the dura. Skeleton: No  acute finding. Other neck: Left-sided sinusitis with OMU obstructive pattern and cortical breakthrough at the posterior wall left maxillary sinus, described on preceding head CT. Upper chest: Moderate, layering right pleural effusion. Generous mediastinal lymph nodes, likely reactive. Review of the MIP images confirms the above findings CTA HEAD FINDINGS Anterior circulation: Heavily calcified carotid siphons with up to 50% stenosis on the right. No branch occlusion, beading, aneurysm, or evidence of vascular malformation. Broad appearance at the anterior communicating artery without discrete aneurysm. Essentially azygos A2 segment. Posterior circulation: Codominant vertebral arteries. Atheromatous plaque asymmetric to the left V4 segment. The vertebral and basilar arteries are smooth and widely patent. Fetal type left PCA. Negative for aneurysm or vascular malformation Venous sinuses: Unremarkable Anatomic variants: As above Review of the MIP images confirms the above findings IMPRESSION: 1. No arterial cause/injury seen underlying the traumatic pattern subarachnoid hemorrhage. 2. Cervical and intracranial carotid atherosclerosis with up to 50% stenosis at the right cavernous ICA. 3. Layering right pleural effusion. 4. Complicated left sinusitis as previously described. Electronically Signed   By: Monte Fantasia M.D.   On: 03/20/2020 10:13   DG Chest 2 View  Result Date: 03/20/2020 CLINICAL DATA:  Assess for pleural effusion. EXAM: CHEST - 2 VIEW COMPARISON:  March 15, 2020 FINDINGS: The mediastinal contour and cardiac silhouette are stable. Heart size is enlarged. Mild increased pulmonary interstitium is identified bilaterally unchanged. There is minimal right pleural effusion. There is no left pleural effusion. The bony structures are stable. IMPRESSION: Minimal right pleural effusion. Electronically Signed   By: Abelardo Diesel M.D.   On: 03/20/2020 11:16   CT Head Wo  Contrast  Result Date: 03/20/2020 CLINICAL DATA:  Fall with facial trauma EXAM: CT HEAD WITHOUT CONTRAST CT MAXILLOFACIAL WITHOUT CONTRAST CT CERVICAL SPINE WITHOUT CONTRAST TECHNIQUE: Multidetector CT imaging of the head, cervical spine, and maxillofacial structures were performed using the standard protocol without intravenous contrast. Multiplanar CT image reconstructions of the cervical spine and maxillofacial structures were also generated. COMPARISON:  01/25/2020 FINDINGS: CT HEAD FINDINGS Brain: High-density/acute hemorrhage in the subarachnoid and possibly subdural spaces over the lateral left temporal lobe, in total measuring up to 3 mm in thickness. No associated mass effect. No visible parenchymal hemorrhage or swelling. Brain atrophy and chronic small vessel ischemia. No evidence of acute infarct. 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 Vascular: No hyperdense vessel or unexpected calcification. Skull: Posterior scalp swelling without calvarial fracture. CT MAXILLOFACIAL FINDINGS Osseous: No acute fracture or mandibular dislocation. Orbits: Swelling superficial to the right orbit. No visible globe or postseptal injury. Bilateral cataract resection. 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 Sinuses: Chronic sinusitis with opacified left maxillary, anterior ethmoid, and frontal sinuses. Left maxillary wall is sclerotic and thickened with a broad dehiscence along the posterolateral wall that is traversed by partially calcified material. No sinus mass was seen on a April 07, 2009 brain MRI, but dehiscence had not been noted at that time either. Soft tissues: Hazy retro antral fat on the left, stable where covered on prior head CT 01/25/2020. CT CERVICAL SPINE FINDINGS Alignment: No traumatic malalignment. Skull base and vertebrae: Negative for cervical spine fracture. T1 and T2 superior endplate fractures with trabecular impaction, nonacute in  seen on a 2021 chest CT. Soft tissues and spinal canal: No prevertebral fluid or swelling. No visible canal hematoma. Mildly enlarged lymph nodes in the upper right jugular chain, posterior to the internal jugular vein, unchanged from November 2021 and thus  presumably reactive Disc levels: Multilevel degenerative facet spurring asymmetric to the right. Ordinary mid and lower cervical disc degeneration. Upper chest: Layering right pleural effusion, small where covered. Critical Value/emergent results were called by telephone at the time of interpretation on 03/20/2020 at 8:00 am to provider Lavonia Drafts , who verbally acknowledged these results. IMPRESSION: 1. Small volume subarachnoid and possibly subdural hemorrhage along the superficial left temporal lobe. No significant mass effect. 2. Scalp and facial swelling without acute fracture. 3. Negative for cervical spine fracture. 4. 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 ENT referral is recommended if appropriate for comorbidities. Electronically Signed   By: Monte Fantasia M.D.   On: 03/20/2020 08:07   CT Angio Neck W and/or Wo Contrast  Result Date: 03/20/2020 CLINICAL DATA:  Head trauma with intracranial arterial injury suspected EXAM: CT ANGIOGRAPHY HEAD AND NECK TECHNIQUE: Multidetector CT imaging of the head and neck was performed using the standard protocol during bolus administration of intravenous contrast. Multiplanar CT image reconstructions and MIPs were obtained to evaluate the vascular anatomy. Carotid stenosis measurements (when applicable) are obtained utilizing NASCET criteria, using the distal internal carotid diameter as the denominator. CONTRAST:  84mL OMNIPAQUE IOHEXOL 350 MG/ML SOLN COMPARISON:  Head CT from earlier today. FINDINGS: CTA NECK FINDINGS Aortic arch:  Atheromatous calcification.  No aneurysm or dissection Right carotid system: Atheromatous wall thickening with calcification at the bifurcation. No stenosis or ulceration. Left carotid system: Atheromatous wall thickening of the common carotid. No flow limiting stenosis or ulceration. Vertebral arteries: No proximal subclavian stenosis. There are codominant vertebral arteries that are smooth and widely patent to the dura. Skeleton: No acute finding. Other neck: Left-sided sinusitis with OMU obstructive pattern and cortical breakthrough at the posterior wall left maxillary sinus, described on preceding head CT. Upper chest: Moderate, layering right pleural effusion. Generous mediastinal lymph nodes, likely reactive. Review of the MIP images confirms the above findings CTA HEAD FINDINGS Anterior circulation: Heavily calcified carotid siphons with up to 50% stenosis on the right. No branch occlusion, beading, aneurysm, or evidence of vascular malformation. Broad appearance at the anterior communicating artery without discrete aneurysm. Essentially azygos A2 segment. Posterior circulation: Codominant vertebral arteries. Atheromatous plaque asymmetric to the left V4 segment. The vertebral and basilar arteries are smooth and widely patent. Fetal type left PCA. Negative for aneurysm or vascular malformation Venous sinuses: Unremarkable Anatomic variants: As above Review of the MIP images confirms the above findings IMPRESSION: 1. No arterial cause/injury seen underlying the traumatic pattern subarachnoid hemorrhage. 2. Cervical and intracranial carotid atherosclerosis with up to 50% stenosis at the right cavernous ICA. 3. Layering right pleural effusion. 4. Complicated left sinusitis as previously described. Electronically Signed   By: Monte Fantasia M.D.   On: 03/20/2020 10:13   CT Cervical Spine Wo Contrast  Result Date: 03/20/2020 CLINICAL DATA:  Fall with facial trauma EXAM: CT HEAD WITHOUT CONTRAST CT  MAXILLOFACIAL WITHOUT CONTRAST CT CERVICAL SPINE WITHOUT CONTRAST TECHNIQUE: Multidetector CT imaging of the head, cervical spine, and maxillofacial structures were performed using the standard protocol without intravenous contrast. Multiplanar CT image reconstructions of the cervical spine and maxillofacial structures were also generated. COMPARISON:  01/25/2020 FINDINGS: CT HEAD FINDINGS Brain: High-density/acute hemorrhage in the subarachnoid and possibly subdural spaces over the lateral left temporal lobe, in total measuring up to 3 mm in thickness.  No associated mass effect. No visible parenchymal hemorrhage or swelling. Brain atrophy and chronic small vessel ischemia. No evidence of acute infarct. 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 Vascular: No hyperdense vessel or unexpected calcification. Skull: Posterior scalp swelling without calvarial fracture. CT MAXILLOFACIAL FINDINGS Osseous: No acute fracture or mandibular dislocation. Orbits: Swelling superficial to the right orbit. No visible globe or postseptal injury. Bilateral cataract resection. 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 Sinuses: Chronic sinusitis with opacified left maxillary, anterior ethmoid, and frontal sinuses. Left maxillary wall is sclerotic and thickened with a broad dehiscence along the posterolateral wall that is traversed by partially calcified material. No sinus mass was seen on a April 07, 2009 brain MRI, but dehiscence had not been noted at that time either. Soft tissues: Hazy retro antral fat on the left, stable where covered on prior head CT 01/25/2020. CT CERVICAL SPINE FINDINGS Alignment: No traumatic malalignment. Skull base and vertebrae: Negative for cervical spine fracture. T1 and T2 superior endplate fractures with trabecular impaction, nonacute in seen on a 2021 chest CT. Soft tissues and spinal canal: No prevertebral fluid or swelling. No visible canal  hematoma. Mildly enlarged lymph nodes in the upper right jugular chain, posterior to the internal jugular vein, unchanged from November 2021 and thus presumably reactive Disc levels: Multilevel degenerative facet spurring asymmetric to the right. Ordinary mid and lower cervical disc degeneration. Upper chest: Layering right pleural effusion, small where covered. Critical Value/emergent results were called by telephone at the time of interpretation on 03/20/2020 at 8:00 am to provider Lavonia Drafts , who verbally acknowledged these results. IMPRESSION: 1. Small volume subarachnoid and possibly subdural hemorrhage along the superficial left temporal lobe. No significant mass effect. 2. Scalp and facial swelling without acute fracture. 3. Negative for cervical spine fracture. 4. 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 ENT referral is recommended if appropriate for comorbidities. Electronically Signed   By: Monte Fantasia M.D.   On: 03/20/2020 08:07   CT Maxillofacial Wo Contrast  Result Date: 03/20/2020 CLINICAL DATA:  Fall with facial trauma EXAM: CT HEAD WITHOUT CONTRAST CT MAXILLOFACIAL WITHOUT CONTRAST CT CERVICAL SPINE WITHOUT CONTRAST TECHNIQUE: Multidetector CT imaging of the head, cervical spine, and maxillofacial structures were performed using the standard protocol without intravenous contrast. Multiplanar CT image reconstructions of the cervical spine and maxillofacial structures were also generated. COMPARISON:  01/25/2020 FINDINGS: CT HEAD FINDINGS Brain: High-density/acute hemorrhage in the subarachnoid and possibly subdural spaces over the lateral left temporal lobe, in total measuring up to 3 mm in thickness. No associated mass effect. No visible parenchymal hemorrhage or swelling. Brain atrophy and chronic small vessel  ischemia. No evidence of acute infarct. 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 Vascular: No hyperdense vessel or unexpected calcification. Skull: Posterior scalp swelling without calvarial fracture. CT MAXILLOFACIAL FINDINGS Osseous: No acute fracture or mandibular dislocation. Orbits: Swelling superficial to the right orbit. No visible globe or postseptal injury. Bilateral cataract resection. 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 Sinuses: Chronic sinusitis with opacified left maxillary, anterior ethmoid, and frontal sinuses. Left maxillary wall is sclerotic and thickened with a broad dehiscence along the posterolateral wall that is traversed by partially calcified material. No sinus mass was seen on a April 07, 2009 brain MRI, but dehiscence  had not been noted at that time either. Soft tissues: Hazy retro antral fat on the left, stable where covered on prior head CT 01/25/2020. CT CERVICAL SPINE FINDINGS Alignment: No traumatic malalignment. Skull base and vertebrae: Negative for cervical spine fracture. T1 and T2 superior endplate fractures with trabecular impaction, nonacute in seen on a 2021 chest CT. Soft tissues and spinal canal: No prevertebral fluid or swelling. No visible canal hematoma. Mildly enlarged lymph nodes in the upper right jugular chain, posterior to the internal jugular vein, unchanged from November 2021 and thus presumably reactive Disc levels: Multilevel degenerative facet spurring asymmetric to the right. Ordinary mid and lower cervical disc degeneration. Upper chest: Layering right pleural effusion, small where covered. Critical Value/emergent results were called by telephone at the time of interpretation on 03/20/2020 at 8:00 am to provider Lavonia Drafts , who verbally acknowledged these results. IMPRESSION: 1. Small volume subarachnoid and possibly subdural hemorrhage along the superficial left temporal lobe. No significant  mass effect. 2. Scalp and facial swelling without acute fracture. 3. Negative for cervical spine fracture. 4. 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 ENT referral is recommended if appropriate for comorbidities. Electronically Signed   By: Monte Fantasia M.D.   On: 03/20/2020 08:07    EKG: Independently reviewed. Normal sinus rhythm Right bundle branch block Left anterior fascicular block  Assessment/Plan Principal Problem:   Syncope and collapse Active Problems:   Chronic obstructive pulmonary disease (HCC)   Seizure disorder (HCC)   Hypothyroidism   ESRD (end stage renal disease) on dialysis (HCC)   Falls frequently   Depression   Chronic diastolic CHF (congestive heart failure) (HCC)   SAH (subarachnoid hemorrhage) (HCC)   Chronic sinusitis     Syncope and collapse Patient was found on the floor by his wife following a fall Patient does not member the events and wife did not see any jerking motions suggestive of a seizure.  He was not incontinent of urine or feces We will place patient on a cardiac monitor to rule out arrhythmias as a cause of his syncope Obtain 2D echocardiogram Consult cardiology    Subarachnoid bleed Traumatic and  following a fall Neurosurgery was consulted by the ED He recommended a CT angiogram and if negative to admit patient to the hospital He also recommends repeat imaging in 6 hours Hold Plavix and anticoagulants    Seizure Chronic Continue Keppra 500 mg twice a day Place patient on seizure precautions      History of CVA with left-sided weakness  Continue metoprolol and statins Hold Plavix due to subarachnoid bleed    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 Maintain consistent carbohydrate diet Sliding scale  coverage with insulin Dialysis days are M/W/F We will request nephrology consult for renal replacement therapy    Depression Continue trazodone and Lexapro    Hypothyroidism Continue Synthroid   Hypomagnesemia Supplement magnesium     Frequent falls Patient's wife is concerned about frequent falls at home with increased risk for fractures Patient will need PT evaluation once stable and may benefit from subacute rehab   Chronic sinusitis Patient had a CT scan of the head and cervical spine which showed chronic sinusitis from left OMU obstruction with complicated disease at the left maxillary sinus with chronic defect in the posterior wall. Discussed with ENT  Dr. Richardson Landry who recommended empiric antibiotic coverage with Augmentin and to have patient follow-up with Dr. Kathyrn Sheriff as an outpatient to discuss surgical management. Upon discharge patient will need appointment to follow-up with Dr. Kathyrn Sheriff      DVT prophylaxis: SCD Code Status: DNR Family Communication: Greater than 50% of time was spent discussing patient's condition and plan of care with his wife Cayson Kalb over the phone.  She expresses concern about his frequent falls at home and would want the patient evaluated for ??  Syncope because she said he has a lot of blackout spells.  All questions and concerns have been addressed.  She verbalizes understanding and agrees with the plan. Disposition Plan: Back to previous home environment/SNF Consults called: Nephrology/Neurosurgery    Collier Bullock MD [facility]     03/20/2020, 12:39 PM

## 2020-03-20 NOTE — ED Notes (Addendum)
Pt was oriented x4 per previous documentation and is now only oriented to self. [facility] NP sent secure chat to advise of change in pt's orientation.

## 2020-03-21 ENCOUNTER — Observation Stay
Admit: 2020-03-21 | Discharge: 2020-03-21 | Disposition: A | Discharge status: Home / Self Care | Payer: Medicare PPO | Attending: Internal Medicine | Admitting: Internal Medicine

## 2020-03-21 DIAGNOSIS — E039 Hypothyroidism, unspecified: Secondary | ICD-10-CM | POA: Diagnosis not present

## 2020-03-21 LAB — BASIC METABOLIC PANEL
Anion gap: 18 — ABNORMAL HIGH (ref 5–15)
BUN: 37 mg/dL — ABNORMAL HIGH (ref 8–23)
CO2: 23 mmol/L (ref 22–32)
Calcium: 7.3 mg/dL — ABNORMAL LOW (ref 8.9–10.3)
Chloride: 96 mmol/L — ABNORMAL LOW (ref 98–111)
Creatinine, Ser: 7.74 mg/dL — ABNORMAL HIGH (ref 0.61–1.24)
GFR, Estimated: 7 mL/min — ABNORMAL LOW (ref >60)
Glucose, Bld: 146 mg/dL — ABNORMAL HIGH (ref 70–99)
Potassium: 4.6 mmol/L (ref 3.5–5.1)
Sodium: 137 mmol/L (ref 135–145)

## 2020-03-21 LAB — CBC
HCT: 31.7 % — ABNORMAL LOW (ref 39.0–52.0)
Hemoglobin: 10 g/dL — ABNORMAL LOW (ref 13.0–17.0)
MCH: 31.3 pg (ref 26.0–34.0)
MCHC: 31.5 g/dL (ref 30.0–36.0)
MCV: 99.1 fL (ref 80.0–100.0)
Platelets: 142 10*3/uL — ABNORMAL LOW (ref 150–400)
RBC: 3.2 MIL/uL — ABNORMAL LOW (ref 4.22–5.81)
RDW: 14.5 % (ref 11.5–15.5)
WBC: 5.8 10*3/uL (ref 4.0–10.5)
nRBC: 0 % (ref 0.0–0.2)

## 2020-03-21 LAB — GLUCOSE, CAPILLARY
Glucose-Capillary: 109 mg/dL — ABNORMAL HIGH (ref 70–99)
Glucose-Capillary: 117 mg/dL — ABNORMAL HIGH (ref 70–99)

## 2020-03-21 LAB — SURGICAL PATHOLOGY

## 2020-03-21 LAB — CBG MONITORING, ED
Glucose-Capillary: 150 mg/dL — ABNORMAL HIGH (ref 70–99)
Glucose-Capillary: 108 mg/dL — ABNORMAL HIGH (ref 70–99)

## 2020-03-21 MED ORDER — SODIUM CHLORIDE 0.9% FLUSH
3.0000 mL | Freq: Two times a day (BID) | INTRAVENOUS | Status: DC
  Administered 2020-03-21 – 2020-03-30 (×15): 3 mL via INTRAVENOUS

## 2020-03-21 MED ORDER — EPOETIN ALFA 4000 UNIT/ML IJ SOLN
4000.0000 [IU] | INTRAMUSCULAR | Status: DC
  Administered 2020-03-21 – 2020-03-23 (×2): 4000 [IU] via INTRAVENOUS
  Filled 2020-03-21 (×2): qty 1

## 2020-03-21 NOTE — Progress Notes (Signed)
PROGRESS NOTE    Ata Pecha  ERD:[id number] DOB: [birth year] DOA: 03/20/2020 PCP: Idelle Crouch, MD   Brief Narrative:  This 70 years old male with PMH significant for ESRD on dialysis MWF, CVA, chronic diastolic heart failure, COPD, PAD, OSA, history of GI bleed, insulin-dependent diabetes, hyperlipidemia presents to the ER for the evaluation fall following syncopal episode.  Patient has developed huge hematoma over his right eye after he was found on the floor.  Patient does not remember how did this happen. CT head and cervical spine shows small volume subarachnoid hemorrhage and possible subdural hemorrhage, along the superficial temporal lobe.  No significant mass-effect. Of note patient was recently discharged from hospital about 3 days ago where he was evaluated for GI bleed.   Patient is admitted for possible syncopal episode, Neurosurgery, Cardiology and Nephrology consults were called.   Assessment & Plan:   Principal Problem:   Syncope and collapse Active Problems:   Chronic obstructive pulmonary disease (HCC)   Seizure disorder (HCC)   Hypothyroidism   ESRD (end stage renal disease) on dialysis (HCC)   Falls frequently   Depression   Chronic diastolic CHF (congestive heart failure) (HCC)   SAH (subarachnoid hemorrhage) (HCC)   Chronic sinusitis  Syncope and collapse: Patient was found on the floor by his wife following a fall. Patient does not member the events and wife did not see any jerking motions suggestive of a seizure.   He was not incontinent of urine or feces. We will place patient on a cardiac monitor to rule out arrhythmias as a cause of his syncope. Obtain 2D echo: Cardiology consulted.  Patient does not have any telemetry changes or rhythm disturbance inconsistent with episode of syncope.  Recommended Linq device.  Subarachnoid bleed, traumatic Traumatic and following a fall Neurosurgery was consulted by the ED He also recommends repeat  imaging in 6 hours Hold Plavix and anticoagulants. Repeat CT head shows stable subarachnoid hemorrhage. Hold Plavix for 7 days, hold DVT prophylaxis for 48 hours.  Follow-up in clinic in 2 to 3 weeks.  Seizure disorder;  Chronic Continue Keppra 500 mg twice a day Continue seizure precautions  History of CVA with left-sided weakness Continue metoprolol and statins. 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 Patient underwent hemodialysis today.  Depression Continue trazodone and Lexapro  Hypothyroidism Continue Synthroid  Hypomagnesemia Supplement magnesium  Frequent falls Patient's wife is concerned about frequent falls at home with increased risk for fractures. Patient will need PT evaluation once stable and may benefit from subacute rehab.   Chronic sinusitis Patient had a CT scan of the head and cervical spine which showed chronic sinusitis from left OMU obstruction with complicated disease at the left maxillary sinus with chronic defect in the posterior wall. Discussed with ENT Dr. Richardson Landry who recommended empiric antibiotic coverage with Augmentin and to have patient follow-up with Dr. Kathyrn Sheriff as an outpatient to discuss surgical management. Upon discharge patient will need appointment to follow-up with Dr. Kathyrn Sheriff      DVT prophylaxis: SCDs Code Status: DNR Family Communication: No family at bedside.  Spoke with wife on phone Disposition Plan:  Status is: Observation  The patient remains OBS appropriate and will d/c before 2 midnights.  Dispo: The patient is from: Home  Anticipated d/c is to: SNF              Anticipated d/c date is: > 3 days              Patient currently is not medically stable to d/c.    Difficult to place patient No    Consultants:   Neurosurgery/cardiology/nephrology  Procedures:  Antimicrobials:  Anti-infectives (From admission, onward)   Start     Dose/Rate Route Frequency Ordered Stop   03/21/20 1800  amoxicillin-clavulanate (AUGMENTIN) 500-125 MG per tablet 500 mg        1 tablet Oral Every 24 hours 03/20/20 1416     03/20/20 1300  amoxicillin-clavulanate (AUGMENTIN) 875-125 MG per tablet 1 tablet  Status:  Discontinued        1 tablet Oral Every 24 hours 03/20/20 1213 03/20/20 1416      Subjective: Patient was seen and examined at bedside.  Overnight events noted.   Patient seems confused, seems to disoriented not following commands.  Objective: Vitals:   03/21/20 1300 03/21/20 1330 03/21/20 1345 03/21/20 1400  BP: (!) 188/73 (!) 186/84 (!) 180/80 (!) 181/83  Pulse: 81 83 82 81  Resp: 18 18 18 18   Temp:      TempSrc:      SpO2:      Weight:      Height:        Intake/Output Summary (Last 24 hours) at 03/21/2020 1534 Last data filed at 03/21/2020 1400 Gross per 24 hour  Intake --  Output 500 ml  Net -500 ml   Filed Weights   03/20/20 0705  Weight: 79.8 kg    Examination:  General exam: Appears calm and comfortable, hematoma noted around right eye. Respiratory system: Clear to auscultation. Respiratory effort normal. Cardiovascular system: S1 & S2 heard, RRR. No JVD, murmurs, rubs, gallops or clicks. No pedal edema. Gastrointestinal system: Abdomen is nondistended, soft and nontender. No organomegaly or masses felt. Normal bowel sounds heard. Central nervous system: Alert and confused. No focal neurological deficits. Extremities: No edema, no cyanosis no clubbing. Skin: No rashes, lesions or ulcers Psychiatry: Not assessed   Data Reviewed: I have personally reviewed following labs and imaging studies  CBC: Recent Labs  Lab 03/15/20 1653 03/16/20 0520 03/17/20 0340 03/20/20 0717 03/21/20 0340  WBC 7.2 5.7 4.8 5.4 5.8  NEUTROABS  5.3  --   --   --   --   HGB 10.8* 10.1* 11.0* 10.8* 10.0*  HCT 35.5* 31.3* 34.7* 33.4* 31.7*  MCV 101.1* 97.5 98.0 99.4 99.1  PLT 170 144* 151 140* 623*   Basic Metabolic Panel: Recent Labs  Lab 03/15/20 1653 03/15/20 2230 03/16/20 0520 03/16/20 1202 03/16/20 2117 03/17/20 0340 03/20/20 0717 03/21/20 0340  NA 134*  --  137  --   --  137 138 137  K 6.1*   < > 4.6 4.8  --  4.0 4.3 4.6  CL 93*  --  97*  --   --  96* 98 96*  CO2 20*  --  22  --   --  28 26 23   GLUCOSE 200*  --  105*  --   --  123* 209* 146*  BUN 50*  --  53*  --   --  32* 30* 37*  CREATININE 8.64*  --  9.06*  --   --  6.20* 6.78* 7.74*  CALCIUM 6.9*  --  6.7*  --   --  7.8* 7.0* 7.3*  PHOS  --   --   --   --  4.8*  --   --   --    < > = values in this interval not displayed.   GFR: Estimated Creatinine Clearance: 9.6 mL/min (A) (by C-G formula based on SCr of 7.74 mg/dL (H)). Liver Function Tests: Recent Labs  Lab 03/17/20 0340  AST 35  ALT 30  ALKPHOS 110  BILITOT 1.0  PROT 6.8  ALBUMIN 3.4*   No results for input(s): LIPASE, AMYLASE in the last 168 hours. No results for input(s): AMMONIA in the last 168 hours. Coagulation Profile: Recent Labs  Lab 03/20/20 0904  INR 1.1   Cardiac Enzymes: Recent Labs  Lab 03/17/20 0340  CKTOTAL 95   BNP (last 3 results) No results for input(s): PROBNP in the last 8760 hours. HbA1C: No results for input(s): HGBA1C in the last 72 hours. CBG: Recent Labs  Lab 03/15/20 2109 03/15/20 2227 03/17/20 1213 03/20/20 1651 03/21/20 0750  GLUCAP 120* 106* 120* 165* 150*   Lipid Profile: No results for input(s): CHOL, HDL, LDLCALC, TRIG, CHOLHDL, LDLDIRECT in the last 72 hours. Thyroid Function Tests: No results for input(s): TSH, T4TOTAL, FREET4, T3FREE, THYROIDAB in the last 72 hours. Anemia Panel: No results for input(s): VITAMINB12, FOLATE, FERRITIN, TIBC, IRON, RETICCTPCT in the last 72 hours. Sepsis Labs: No results for input(s): PROCALCITON,  LATICACIDVEN in the last 168 hours.  Recent Results (from the past 240 hour(s))  SARS CORONAVIRUS 2 (TAT 6-24 HRS) Nasopharyngeal Nasopharyngeal Swab     Status: None   Collection Time: 03/15/20 10:41 PM   Specimen: Nasopharyngeal Swab  Result Value Ref Range Status   SARS Coronavirus 2 NEGATIVE NEGATIVE Final    Comment: (NOTE) SARS-CoV-2 target nucleic acids are NOT DETECTED.  The SARS-CoV-2 RNA is generally detectable in upper and lower respiratory specimens during the acute phase of infection. Negative results do not preclude SARS-CoV-2 infection, do not rule out co-infections with other pathogens, and should not be used as the sole basis for treatment or other patient management decisions. Negative results must be combined with clinical observations, patient history, and epidemiological information. The expected result is Negative.  Fact Sheet for Patients: SugarRoll.be  Fact Sheet for Healthcare Providers: https://www.woods-mathews.com/  This test is not yet approved or cleared by the [place] FDA and  has been authorized for detection and/or diagnosis of SARS-CoV-2 by FDA under an Emergency Use Authorization (EUA). This EUA will remain  in effect (meaning this test can be used) for the duration of the COVID-19 declaration under Se ction 564(b)(1) of the Act, 21 U.S.C. section 360bbb-3(b)(1), unless the authorization is terminated or revoked sooner.  Performed at [facility], [place] [address], [place], [place] [address]   SARS Coronavirus 2 by RT PCR (hospital order, performed in [facility] hospital lab) Nasopharyngeal Nasopharyngeal Swab     Status: None   Collection Time: 03/20/20  9:04 AM   Specimen: Nasopharyngeal Swab  Result Value Ref Range Status   SARS Coronavirus 2 NEGATIVE NEGATIVE Final    Comment: (NOTE) SARS-CoV-2 target nucleic acids are NOT DETECTED.  The SARS-CoV-2 RNA is generally detectable in  upper and lower respiratory specimens during the acute phase of infection. The lowest concentration of SARS-CoV-2 viral copies this assay can detect is 250 copies / mL. A negative result does not preclude SARS-CoV-2 infection and should not be used as the sole basis for treatment or other patient management decisions.  A negative result  may occur with improper specimen collection / handling, submission of specimen other than nasopharyngeal swab, presence of viral mutation(s) within the areas targeted by this assay, and inadequate number of viral copies (<250 copies / mL). A negative result must be combined with clinical observations, patient history, and epidemiological information.  Fact Sheet for Patients:   StrictlyIdeas.no  Fact Sheet for Healthcare Providers: BankingDealers.co.za  This test is not yet approved or  cleared by the [place] FDA and has been authorized for detection and/or diagnosis of SARS-CoV-2 by FDA under an Emergency Use Authorization (EUA).  This EUA will remain in effect (meaning this test can be used) for the duration of the COVID-19 declaration under Section 564(b)(1) of the Act, 21 U.S.C. section 360bbb-3(b)(1), unless the authorization is terminated or revoked sooner.  Performed at [facility], [address] [place], [place] [address]      Radiology Studies: CT Angio Head W or Wo Contrast  Result Date: 03/20/2020 CLINICAL DATA:  Head trauma with intracranial arterial injury suspected EXAM: CT ANGIOGRAPHY HEAD AND NECK TECHNIQUE: Multidetector CT imaging of the head and neck was performed using the standard protocol during bolus administration of intravenous contrast. Multiplanar CT image reconstructions and MIPs were obtained to evaluate the vascular anatomy. Carotid stenosis measurements (when applicable) are obtained utilizing NASCET criteria, using the distal internal carotid diameter as  the denominator. CONTRAST:  67mL OMNIPAQUE IOHEXOL 350 MG/ML SOLN COMPARISON:  Head CT from earlier today. FINDINGS: CTA NECK FINDINGS Aortic arch: Atheromatous calcification.  No aneurysm or dissection Right carotid system: Atheromatous wall thickening with calcification at the bifurcation. No stenosis or ulceration. Left carotid system: Atheromatous wall thickening of the common carotid. No flow limiting stenosis or ulceration. Vertebral arteries: No proximal subclavian stenosis. There are codominant vertebral arteries that are smooth and widely patent to the dura. Skeleton: No acute finding. Other neck: Left-sided sinusitis with OMU obstructive pattern and cortical breakthrough at the posterior wall left maxillary sinus, described on preceding head CT. Upper chest: Moderate, layering right pleural effusion. Generous mediastinal lymph nodes, likely reactive. Review of the MIP images confirms the above findings CTA HEAD FINDINGS Anterior circulation: Heavily calcified carotid siphons with up to 50% stenosis on the right. No branch occlusion, beading, aneurysm, or evidence of vascular malformation. Broad appearance at the anterior communicating artery without discrete aneurysm. Essentially azygos A2 segment. Posterior circulation: Codominant vertebral arteries. Atheromatous plaque asymmetric to the left V4 segment. The vertebral and basilar arteries are smooth and widely patent. Fetal type left PCA. Negative for aneurysm or vascular malformation Venous sinuses: Unremarkable Anatomic variants: As above Review of the MIP images confirms the above findings IMPRESSION: 1. No arterial cause/injury seen underlying the traumatic pattern subarachnoid hemorrhage. 2. Cervical and intracranial carotid atherosclerosis with up to 50% stenosis at the right cavernous ICA. 3. Layering right pleural effusion. 4. Complicated left sinusitis as previously described. Electronically Signed   By: Monte Fantasia M.D.   On: 03/20/2020  10:13   DG Chest 2 View  Result Date: 03/20/2020 CLINICAL DATA:  Assess for pleural effusion. EXAM: CHEST - 2 VIEW COMPARISON:  March 15, 2020 FINDINGS: The mediastinal contour and cardiac silhouette are stable. Heart size is enlarged. Mild increased pulmonary interstitium is identified bilaterally unchanged. There is minimal right pleural effusion. There is no left pleural effusion. The bony structures are stable. IMPRESSION: Minimal right pleural effusion. Electronically Signed   By: Abelardo Diesel M.D.   On: 03/20/2020 11:16   CT HEAD WO CONTRAST  Result Date: 03/20/2020 CLINICAL DATA:  Initial evaluation for acute mental status change. Worsening. EXAM: CT HEAD WITHOUT CONTRAST TECHNIQUE: Contiguous axial images were obtained from the base of the skull through the vertex without intravenous contrast. COMPARISON:  Prior head CT from earlier the same day. FINDINGS: Brain: Posttraumatic subarachnoid hemorrhage involving the peripheral left frontotemporal convexity again seen, not significantly changed in overall appearance and volume from previous. Suspected superimposed hemorrhagic contusion also similar. Overlying subdural hematoma also not significantly changed, measuring up to 5-6 mm in maximal thickness. Trace 2 mm left-to-right shift, unchanged. No hydrocephalus or ventricular trapping. Basilar cisterns remain patent. No new intracranial hemorrhage. No acute large vessel territory infarct. Underlying atrophy with chronic small vessel ischemic disease again noted. No appreciable mass lesion. Vascular: No asymmetric hyperdense vessel. Calcified atherosclerosis present at the skull base. Skull: Evolving right frontoparietal scalp contusion. Calvarium intact. Sinuses/Orbits: Globes and orbital soft tissues demonstrate no acute finding. Chronic left frontoethmoidal and maxillary sinusitis again noted. Mastoids remain clear. Other: None. IMPRESSION: 1. No significant interval change in left frontotemporal  convexity subarachnoid hemorrhage with probable superimposed hemorrhagic contusion. Overlying extra-axial hemorrhage also not significantly changed, likely subdural. Trace 2 mm left-to-right shift, unchanged. No hydrocephalus or ventricular trapping. 2. No other new acute intracranial abnormality. 3. Evolving right frontoparietal scalp contusion. No calvarial fracture. 4. Chronic left frontoethmoidal and maxillary sinusitis. Electronically Signed   By: Jeannine Boga M.D.   On: 03/20/2020 20:20   CT Head Wo Contrast  Result Date: 03/20/2020 CLINICAL DATA:  Follow-up of left temporal lobe subarachnoid and possible subdural hemorrhage. EXAM: CT HEAD WITHOUT CONTRAST TECHNIQUE: Contiguous axial images were obtained from the base of the skull through the vertex without intravenous contrast. COMPARISON:  Earlier today at 7:22 a.m. FINDINGS: 2:32 p.m. Brain: Cerebral atrophy. Mild increase in small volume subarachnoid hemorrhage adjacent the left temporal and frontal lobes, including on 23/2. Small volume subdural hemorrhage including at 4 mm on 14/2 is increased, especially superiorly. Example adjacent the left frontal lobe near the vertex on coronal image 24/4 at 3 mm. Adjacent the left parietal lobe at up to 5 mm on coronal image 43, new. No significant mass effect or midline shift. No complicating infarct. No hydrocephalus or intraventricular hemorrhage. Mild low density in the periventricular white matter likely related to small vessel disease. Vascular: Intracranial atherosclerosis. Skull: Posterior scalp soft tissue swelling is greater right than left and relatively similar. No skull fracture. Sinuses/Orbits: Normal imaged portions of the orbits and globes. Left sided sinus opacification was detailed on CT of earlier today. Other: None. IMPRESSION: 1. Mild increase in subarachnoid hemorrhage adjacent the left temporal and frontal lobes. 2. Small volume extra-axial hematoma, favoring subdural, mildly  increased especially superiorly. 3.  Cerebral atrophy and small vessel ischemic change. Electronically Signed   By: Abigail Miyamoto M.D.   On: 03/20/2020 15:02   CT Head Wo Contrast  Result Date: 03/20/2020 CLINICAL DATA:  Fall with facial trauma EXAM: CT HEAD WITHOUT CONTRAST CT MAXILLOFACIAL WITHOUT CONTRAST CT CERVICAL SPINE WITHOUT CONTRAST TECHNIQUE: Multidetector CT imaging of the head, cervical spine, and maxillofacial structures were performed using the standard protocol without intravenous contrast. Multiplanar CT image reconstructions of the cervical spine and maxillofacial structures were also generated. COMPARISON:  01/25/2020 FINDINGS: CT HEAD FINDINGS Brain: High-density/acute hemorrhage in the subarachnoid and possibly subdural spaces over the lateral left temporal lobe, in total measuring up to 3 mm in thickness. No associated mass effect. No visible parenchymal hemorrhage or swelling. Brain atrophy and  chronic small vessel ischemia. No evidence of acute infarct. 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 Vascular: No hyperdense vessel or unexpected calcification. Skull: Posterior scalp swelling without calvarial fracture. CT MAXILLOFACIAL FINDINGS Osseous: No acute fracture or mandibular dislocation. Orbits: Swelling superficial to the right orbit. No visible globe or postseptal injury. Bilateral cataract resection. 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 Sinuses: Chronic sinusitis with opacified left maxillary, anterior ethmoid, and frontal sinuses. Left maxillary wall is sclerotic and thickened with a broad dehiscence along the posterolateral wall that is traversed by partially calcified material. No sinus mass was seen on a April 07, 2009 brain MRI, but dehiscence had not been noted at that time either. Soft tissues: Hazy retro antral fat on the left, stable where covered on prior head CT 01/25/2020. CT CERVICAL SPINE FINDINGS  Alignment: No traumatic malalignment. Skull base and vertebrae: Negative for cervical spine fracture. T1 and T2 superior endplate fractures with trabecular impaction, nonacute in seen on a 2021 chest CT. Soft tissues and spinal canal: No prevertebral fluid or swelling. No visible canal hematoma. Mildly enlarged lymph nodes in the upper right jugular chain, posterior to the internal jugular vein, unchanged from November 2021 and thus presumably reactive Disc levels: Multilevel degenerative facet spurring asymmetric to the right. Ordinary mid and lower cervical disc degeneration. Upper chest: Layering right pleural effusion, small where covered. Critical Value/emergent results were called by telephone at the time of interpretation on 03/20/2020 at 8:00 am to provider Lavonia Drafts , who verbally acknowledged these results. IMPRESSION: 1. Small volume subarachnoid and possibly subdural hemorrhage along the superficial left temporal lobe. No significant mass effect. 2. Scalp and facial swelling without acute fracture. 3. Negative for cervical spine fracture. 4. 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 ENT referral is recommended if appropriate for comorbidities. Electronically Signed   By: Monte Fantasia M.D.   On: 03/20/2020 08:07   CT Angio Neck W and/or Wo Contrast  Result Date: 03/20/2020 CLINICAL DATA:  Head trauma with intracranial arterial injury suspected EXAM: CT ANGIOGRAPHY HEAD AND NECK TECHNIQUE: Multidetector CT imaging of the head and neck was performed using the standard protocol during bolus administration of intravenous contrast. Multiplanar CT image reconstructions and MIPs were obtained to evaluate the vascular anatomy. Carotid stenosis measurements (when applicable) are obtained utilizing NASCET criteria, using the distal  internal carotid diameter as the denominator. CONTRAST:  10mL OMNIPAQUE IOHEXOL 350 MG/ML SOLN COMPARISON:  Head CT from earlier today. FINDINGS: CTA NECK FINDINGS Aortic arch: Atheromatous calcification.  No aneurysm or dissection Right carotid system: Atheromatous wall thickening with calcification at the bifurcation. No stenosis or ulceration. Left carotid system: Atheromatous wall thickening of the common carotid. No flow limiting stenosis or ulceration. Vertebral arteries: No proximal subclavian stenosis. There are codominant vertebral arteries that are smooth and widely patent to the dura. Skeleton: No acute finding. Other neck: Left-sided sinusitis with OMU obstructive pattern and cortical breakthrough at the posterior wall left maxillary sinus, described on preceding head CT. Upper chest: Moderate, layering right pleural effusion. Generous mediastinal lymph nodes, likely reactive. Review of the MIP images confirms the above findings CTA HEAD FINDINGS Anterior circulation: Heavily calcified carotid siphons with up to 50% stenosis on the right. No branch occlusion, beading, aneurysm, or evidence of vascular malformation. Broad appearance at the  anterior communicating artery without discrete aneurysm. Essentially azygos A2 segment. Posterior circulation: Codominant vertebral arteries. Atheromatous plaque asymmetric to the left V4 segment. The vertebral and basilar arteries are smooth and widely patent. Fetal type left PCA. Negative for aneurysm or vascular malformation Venous sinuses: Unremarkable Anatomic variants: As above Review of the MIP images confirms the above findings IMPRESSION: 1. No arterial cause/injury seen underlying the traumatic pattern subarachnoid hemorrhage. 2. Cervical and intracranial carotid atherosclerosis with up to 50% stenosis at the right cavernous ICA. 3. Layering right pleural effusion. 4. Complicated left sinusitis as previously described. Electronically Signed   By: Monte Fantasia M.D.   On: 03/20/2020 10:13   CT Cervical Spine Wo Contrast  Result Date: 03/20/2020 CLINICAL DATA:  Fall with facial trauma EXAM: CT HEAD WITHOUT CONTRAST CT MAXILLOFACIAL WITHOUT CONTRAST CT CERVICAL SPINE WITHOUT CONTRAST TECHNIQUE: Multidetector CT imaging of the head, cervical spine, and maxillofacial structures were performed using the standard protocol without intravenous contrast. Multiplanar CT image reconstructions of the cervical spine and maxillofacial structures were also generated. COMPARISON:  01/25/2020 FINDINGS: CT HEAD FINDINGS Brain: High-density/acute hemorrhage in the subarachnoid and possibly subdural spaces over the lateral left temporal lobe, in total measuring up to 3 mm in thickness. No associated mass effect. No visible parenchymal hemorrhage or swelling. Brain atrophy and chronic small vessel ischemia. No evidence of acute infarct. 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 Vascular: No hyperdense vessel or unexpected calcification. Skull: Posterior scalp swelling without calvarial fracture. CT MAXILLOFACIAL FINDINGS Osseous: No acute fracture or mandibular dislocation. Orbits: Swelling superficial to the right orbit. No visible globe or postseptal injury. Bilateral cataract resection. 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 Sinuses: Chronic sinusitis with opacified left maxillary, anterior ethmoid, and frontal sinuses. Left maxillary wall is sclerotic and thickened with a broad dehiscence along the posterolateral wall that is traversed by partially calcified material. No sinus mass was seen on a April 07, 2009 brain MRI, but dehiscence had not been noted at that time either. Soft tissues: Hazy retro antral fat on the left, stable where covered on prior head CT 01/25/2020. CT CERVICAL SPINE FINDINGS Alignment: No traumatic malalignment. Skull base and vertebrae: Negative for cervical spine fracture. T1 and T2 superior  endplate fractures with trabecular impaction, nonacute in seen on a 2021 chest CT. Soft tissues and spinal canal: No prevertebral fluid or swelling. No visible canal hematoma. Mildly enlarged lymph nodes in the upper right jugular chain, posterior to the internal jugular vein, unchanged from November 2021 and thus presumably reactive Disc levels: Multilevel degenerative facet spurring asymmetric to the right. Ordinary mid and lower cervical disc degeneration. Upper chest: Layering right pleural effusion, small where covered. Critical Value/emergent results were called by telephone at the time of interpretation on 03/20/2020 at 8:00 am to provider Lavonia Drafts , who verbally acknowledged these results. IMPRESSION: 1. Small volume subarachnoid and possibly subdural hemorrhage along the superficial left temporal lobe. No significant mass effect. 2. Scalp and facial swelling without acute fracture. 3. Negative for cervical spine fracture. 4. 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 ENT referral is recommended if appropriate for comorbidities. Electronically Signed   By: Monte Fantasia M.D.   On: 03/20/2020 08:07   CT Maxillofacial Wo Contrast  Result Date: 03/20/2020 CLINICAL DATA:  Fall with facial  trauma EXAM: CT HEAD WITHOUT CONTRAST CT MAXILLOFACIAL WITHOUT CONTRAST CT CERVICAL SPINE WITHOUT CONTRAST TECHNIQUE: Multidetector CT imaging of the head, cervical spine, and maxillofacial structures were performed using the standard protocol without intravenous contrast. Multiplanar CT image reconstructions of the cervical spine and maxillofacial structures were also generated. COMPARISON:  01/25/2020 FINDINGS: CT HEAD FINDINGS Brain: High-density/acute hemorrhage in the subarachnoid and possibly subdural spaces over the lateral left temporal  lobe, in total measuring up to 3 mm in thickness. No associated mass effect. No visible parenchymal hemorrhage or swelling. Brain atrophy and chronic small vessel ischemia. No evidence of acute infarct. 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 Vascular: No hyperdense vessel or unexpected calcification. Skull: Posterior scalp swelling without calvarial fracture. CT MAXILLOFACIAL FINDINGS Osseous: No acute fracture or mandibular dislocation. Orbits: Swelling superficial to the right orbit. No visible globe or postseptal injury. Bilateral cataract resection. 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 Sinuses: Chronic sinusitis with opacified left maxillary, anterior ethmoid, and frontal sinuses. Left maxillary wall is sclerotic and thickened with a broad dehiscence along the posterolateral wall that is traversed by partially calcified material. No sinus mass was seen on a April 07, 2009 brain MRI, but dehiscence had not been noted at that time either. Soft tissues: Hazy retro antral fat on the left, stable where covered on prior head CT 01/25/2020. CT CERVICAL SPINE FINDINGS Alignment: No traumatic malalignment. Skull base and vertebrae: Negative for cervical spine fracture. T1 and T2 superior endplate fractures with trabecular impaction, nonacute in seen on a 2021 chest CT. Soft tissues and spinal canal: No prevertebral fluid or swelling. No visible canal hematoma. Mildly enlarged lymph nodes in the upper right jugular chain, posterior to the internal jugular vein, unchanged from November 2021 and thus presumably reactive Disc levels: Multilevel degenerative facet spurring asymmetric to the right. Ordinary mid and lower cervical disc degeneration. Upper chest: Layering right pleural effusion, small where covered. Critical Value/emergent results were called by telephone at the time of interpretation on 03/20/2020 at 8:00 am to provider Lavonia Drafts , who  verbally acknowledged these results. IMPRESSION: 1. Small volume subarachnoid and possibly subdural hemorrhage along the superficial left temporal lobe. No significant mass effect. 2. Scalp and facial swelling without acute fracture. 3. Negative for cervical spine fracture. 4. 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 ENT referral is recommended if appropriate for comorbidities. Electronically Signed   By: Monte Fantasia M.D.   On: 03/20/2020 08:07    Scheduled Meds: . amoxicillin-clavulanate  1 tablet Oral Q24H  . atorvastatin  10 mg Oral Daily  . b complex-vitamin c-folic acid  1 tablet Oral Daily  . calcium acetate  1,334 mg Oral TID WC  . epoetin (EPOGEN/PROCRIT) injection  4,000 Units Intravenous Q M,W,F-HD  . gabapentin  100 mg Oral BID  . insulin aspart  0-6 Units Subcutaneous TID WC  . levETIRAcetam  500 mg Oral BID  . levothyroxine  200 mcg Oral QAC breakfast  . [START ON 03/22/2020] lidocaine-prilocaine  1 application Topical Q T,Th,Sa-HD  . lipase/protease/amylase  24,000 Units Oral TID WC  . metoprolol succinate  50 mg Oral Daily  . pantoprazole  40 mg Oral Daily  . sodium chloride flush  3 mL Intravenous Q12H  . sodium chloride flush  3 mL Intravenous Q12H  .  traZODone  100 mg Oral QHS   Continuous Infusions:   LOS: 0 days    Time spent: 35 mins    Arrion Broaddus, MD [facility]   If 7PM-7AM, please contact night-coverage

## 2020-03-21 NOTE — Progress Notes (Signed)
OT Cancellation Note  Patient Details Name: Kyle [name] MRN: [id number] DOB: Jan 15, 1951   Cancelled Treatment:    Reason Eval/Treat Not Completed: Patient at procedure or test/ unavailable. Pt off the floor. Will re-attempt OT eval at later date/time as available and medically appropriate.   Jeni Salles, MPH, MS, OTR/L ascom [phone] 03/21/20, 12:38 PM

## 2020-03-21 NOTE — Progress Notes (Signed)
Hemodialysis patient known at [facility] MWF 10:15, wife normally transports. Patient education completed, no concerns stated.Please contact me with any dialysis placement concerns.  Kyle [name] Dialysis Coordinator [phone]

## 2020-03-21 NOTE — Progress Notes (Signed)
*  PRELIMINARY RESULTS* Echocardiogram 2D Echocardiogram has been performed.  Kyle [name] 03/21/2020, 7:12 PM

## 2020-03-21 NOTE — ED Notes (Signed)
Pt to dialysis.

## 2020-03-21 NOTE — Consult Note (Signed)
[facility] Cardiology Consultation Note  Patient ID: Kyle [name], MRN: [id number], DOB/AGE: 72-Oct-1952 72 y.o. Admit date: 03/20/2020   Date of Consult: 03/21/2020 Primary Physician: Idelle Crouch, MD  12/24.  Overall there has been no evidence of change in patient's condition at this time.  He still does have a significant hematoma of his right face.  The patient has not had any telemetry changes or rhythm disturbances consistent with his episode of syncope and/or fall.  He has had further evaluation from the peripheral vascular standpoint with no evidence of carotid atherosclerosis as his primary issue.  There is been no seizure by EEG with recent work-up.  There has been no evidence of congestive heart failure myocardial infarction or acute coronary syndrome.  We have discussed the possibility of a Linq device although I am not 100% sure he is understanding all and may have to have further evaluation by family members as well  Past Medical History:  Diagnosis Date  . Anemia   . Anxiety   . BPH (benign prostatic hyperplasia)   . Chronic kidney disease   . Colon polyps   . COPD (chronic obstructive pulmonary disease) ([place])   . Depression   . Diabetes mellitus without complication ([place])   . Diverticulosis of colon   . History of kidney stones   . Hyperlipidemia   . Hypertension   . Hypothyroidism   . Nephrolithiasis   . Seizures ([place])    2015 last seizure  . Stroke [facility])       Surgical History:  Past Surgical History:  Procedure Laterality Date  . AV FISTULA PLACEMENT Left 03/30/2019   Procedure: right brachiocephalic fistula creation;  Surgeon: Marty Heck, MD;  Location: [place];  Service: Vascular;  Laterality: Left;  . BIOPSY  04/05/2019   Procedure: BIOPSY;  Surgeon: Wilford Corner, MD;  Location: [place];  Service: Endoscopy;;  . COLONOSCOPY    . COLONOSCOPY WITH PROPOFOL N/A 05/30/2015   Procedure: COLONOSCOPY WITH PROPOFOL;  Surgeon: Manya Silvas, MD;  Location: [facility] END[facility]COPY;  Service: Endoscopy;  Laterality: N/A;  . COLONOSCOPY WITH PROPOFOL N/A 12/01/2018   Procedure: COLONOSCOPY WITH PROPOFOL;  Surgeon: Toledo, [name], MD;  Location: [facility] ENDOSCOPY;  Service: Gastroenterology;  Laterality: N/A;  . COLONOSCOPY WITH PROPOFOL N/A 04/09/2019   Procedure: COLONOSCOPY WITH PROPOFOL;  Surgeon: Carol Ada, MD;  Location: [place];  Service: Endoscopy;  Laterality: N/A;  . ENTEROSCOPY N/A 05/05/2019   Procedure: ENTEROSCOPY;  Surgeon: Ronnette Juniper, MD;  Location: [facility] ENDOSCOPY;  Service: Gastroenterology;  Laterality: N/A;  Push enteroscopy  . ENTEROSCOPY N/A 07/28/2019   Procedure: ENTEROSCOPY;  Surgeon: Virgel Manifold, MD;  Location: [facility] ENDOSCOPY;  Service: Endoscopy;  Laterality: N/A;  . ESOPHAGOGASTRODUODENOSCOPY N/A 03/17/2020   Procedure: ESOPHAGOGASTRODUODENOSCOPY (EGD);  Surgeon: Lesly Rubenstein, MD;  Location: [facility] ENDOSCOPY;  Service: Endoscopy;  Laterality: N/A;  . ESOPHAGOGASTRODUODENOSCOPY (EGD) WITH PROPOFOL N/A 04/05/2019   Procedure: ESOPHAGOGASTRODUODENOSCOPY (EGD) WITH PROPOFOL;  Surgeon: Wilford Corner, MD;  Location: [place];  Service: Endoscopy;  Laterality: N/A;  . GIVENS CAPSULE STUDY N/A 05/01/2019   Procedure: GIVENS CAPSULE STUDY;  Surgeon: Ronald Lobo, MD;  Location: [place];  Service: Endoscopy;  Laterality: N/A;  . HOT HEMOSTASIS N/A 04/09/2019   Procedure: HOT HEMOSTASIS (ARGON PLASMA COAGULATION/BICAP);  Surgeon: Carol Ada, MD;  Location: [place];  Service: Endoscopy;  Laterality: N/A;  . HOT HEMOSTASIS N/A 05/05/2019   Procedure: HOT HEMOSTASIS (ARGON PLASMA COAGULATION/BICAP);  Surgeon: Therisa Doyne,  Megan Salon, MD;  Location: [place];  Service: Gastroenterology;  Laterality: N/A;  . INSERTION OF DIALYSIS CATHETER Right 03/30/2019   Procedure: Ultrasound guided right internal jugular tunneled dialysis catheter placement;  Surgeon: Marty Heck, MD;  Location: [facility] OR;   Service: Vascular;  Laterality: Right;  . kidney stone    . REMOVAL OF A DIALYSIS CATHETER Right 06/26/2019   Procedure: REMOVAL OF A DIALYSIS CATHETER ( PD CATH REMOVAL AND I J CATH REMOVAL;  Surgeon: Katha Cabal, MD;  Location: [facility] ORS;  Service: Vascular;  Laterality: Right;  . THYROIDECTOMY    . VARICOCELE EXCISION    . VIDEO BRONCHOSCOPY Bilateral 05/25/2016   Procedure: VIDEO BRONCHOSCOPY WITHOUT FLUORO;  Surgeon: Juanito Doom, MD;  Location: [facility] ENDOSCOPY;  Service: Cardiopulmonary;  Laterality: Bilateral;     Home Meds: Prior to Admission medications   Medication Sig Start Date End Date Taking? Authorizing Provider  atorvastatin (LIPITOR) 10 MG tablet Take 10 mg by mouth daily. 01/08/20  Yes [provider]  B Complex-C-Folic Acid (NEPHRO-VITE PO) Take 1 tablet by mouth daily.   Yes [provider]  calcium acetate (PHOSLO) 667 MG capsule Take 2 capsules (1,334 mg total) by mouth 3 (three) times daily with meals. 03/20/19  Yes Wieting, Richard, MD  clopidogrel (PLAVIX) 75 MG tablet Take 75 mg by mouth daily.   Yes [provider]  escitalopram (LEXAPRO) 10 MG tablet Take 10 mg by mouth daily. 07/14/19  Yes [provider]  gabapentin (NEURONTIN) 100 MG capsule Take 1 capsule (100 mg total) by mouth 2 (two) times daily. 01/28/20  Yes Pokhrel, Laxman, MD  levETIRAcetam (KEPPRA) 500 MG tablet Take 500 mg by mouth 2 (two) times daily. 03/09/20  Yes [provider]  levothyroxine (SYNTHROID) 200 MCG tablet Take 200 mcg by mouth daily before breakfast.  02/25/19  Yes [provider]  lidocaine-prilocaine (EMLA) cream Apply 1 application topically Every Tuesday,Thursday,and Saturday with dialysis.    Yes [provider]  metoprolol succinate (TOPROL-XL) 50 MG 24 hr tablet Take 50 mg by mouth daily. 02/22/20  Yes [provider]  Pancrelipase, Lip-Prot-Amyl, 24000-76000 units CPEP Take 1 capsule by mouth 3 (three)  times daily with meals.    Yes [provider]  pantoprazole (PROTONIX) 40 MG tablet Take 40 mg by mouth daily.   Yes [provider]    Inpatient Medications:  . amoxicillin-clavulanate  1 tablet Oral Q24H  . atorvastatin  10 mg Oral Daily  . b complex-vitamin c-folic acid  1 tablet Oral Daily  . calcium acetate  1,334 mg Oral TID WC  . gabapentin  100 mg Oral BID  . insulin aspart  0-6 Units Subcutaneous TID WC  . levETIRAcetam  500 mg Oral BID  . levothyroxine  200 mcg Oral QAC breakfast  . [START ON 03/22/2020] lidocaine-prilocaine  1 application Topical Q T,Th,Sa-HD  . lipase/protease/amylase  24,000 Units Oral TID WC  . metoprolol succinate  50 mg Oral Daily  . pantoprazole  40 mg Oral Daily  . sodium chloride flush  3 mL Intravenous Q12H  . traZODone  100 mg Oral QHS     Allergies:  Allergies  Allergen Reactions  . Vimpat [Lacosamide] Other (See Comments)    Reports causes seizures    Social History   Socioeconomic History  . Marital status: Married    Spouse name: Not on file  . Number of children: Not on file  . Years of education: Not on  file  . Highest education level: Not on file  Occupational History  . Not on file  Tobacco Use  . Smoking status: Light Tobacco Smoker    Packs/day: 0.25    Years: 40.00    Pack years: 10.00    Types: Cigarettes    Last attempt to quit: 05/25/2016    Years since quitting: 3.8  . Smokeless tobacco: Never Used  Vaping Use  . Vaping Use: Never used  Substance and Sexual Activity  . Alcohol use: Not Currently  . Drug use: No  . Sexual activity: Not on file  Other Topics Concern  . Not on file  Social History Narrative  . Not on file   Social Determinants of Health   Financial Resource Strain: Not on file  Food Insecurity: Not on file  Transportation Needs: Not on file  Physical Activity: Not on file  Stress: Not on file  Social Connections: Not on file  Intimate Partner Violence: Not on file      Family History  Problem Relation Age of Onset  . Diabetes Mother   . Diabetes Maternal Grandmother   . Diabetes Maternal Grandfather   . Lung cancer Father   . Emphysema Paternal Grandfather      Review of Systems Positive for none Negative for: General:  chills, fever, night sweats or weight changes.  Cardiovascular: PND orthopnea syncope dizziness  Dermatological skin lesions rashes Respiratory: Cough congestion Urologic: Frequent urination urination at night and hematuria Abdominal: negative for nausea, vomiting, diarrhea, bright red blood per rectum, melena, or hematemesis Neurologic: negative for visual changes, and/or hearing changes  All other systems reviewed and are otherwise negative except as noted above.  Labs: No results for input(s): CKTOTAL, CKMB, TROPONINI in the last 72 hours. Lab Results  Component Value Date   WBC 5.8 03/21/2020   HGB 10.0 (L) 03/21/2020   HCT 31.7 (L) 03/21/2020   MCV 99.1 03/21/2020   PLT 142 (L) 03/21/2020    Recent Labs  Lab 03/17/20 0340 03/20/20 0717 03/21/20 0340  NA 137   < > 137  K 4.0   < > 4.6  CL 96*   < > 96*  CO2 28   < > 23  BUN 32*   < > 37*  CREATININE 6.20*   < > 7.74*  CALCIUM 7.8*   < > 7.3*  PROT 6.8  --   --   BILITOT 1.0  --   --   ALKPHOS 110  --   --   ALT 30  --   --   AST 35  --   --   GLUCOSE 123*   < > 146*   < > = values in this interval not displayed.   Lab Results  Component Value Date   CHOL 130 03/16/2019   HDL 41 03/16/2019   LDLCALC 48 03/16/2019   TRIG 207 (H) 03/16/2019   No results found for: DDIMER  Radiology/Studies:  CT Angio Head W or Wo Contrast  Result Date: 03/20/2020 CLINICAL DATA:  Head trauma with intracranial arterial injury suspected EXAM: CT ANGIOGRAPHY HEAD AND NECK TECHNIQUE: Multidetector CT imaging of the head and neck was performed using the standard protocol during bolus administration of intravenous contrast. Multiplanar CT image reconstructions and MIPs  were obtained to evaluate the vascular anatomy. Carotid stenosis measurements (when applicable) are obtained utilizing NASCET criteria, using the distal internal carotid diameter as the denominator. CONTRAST:  61mL OMNIPAQUE IOHEXOL 350 MG/ML SOLN COMPARISON:  Head CT from earlier today. FINDINGS: CTA NECK FINDINGS Aortic arch: Atheromatous calcification.  No aneurysm or dissection Right carotid system: Atheromatous wall thickening with calcification at the bifurcation. No stenosis or ulceration. Left carotid system: Atheromatous wall thickening of the common carotid. No flow limiting stenosis or ulceration. Vertebral arteries: No proximal subclavian stenosis. There are codominant vertebral arteries that are smooth and widely patent to the dura. Skeleton: No acute finding. Other neck: Left-sided sinusitis with OMU obstructive pattern and cortical breakthrough at the posterior wall left maxillary sinus, described on preceding head CT. Upper chest: Moderate, layering right pleural effusion. Generous mediastinal lymph nodes, likely reactive. Review of the MIP images confirms the above findings CTA HEAD FINDINGS Anterior circulation: Heavily calcified carotid siphons with up to 50% stenosis on the right. No branch occlusion, beading, aneurysm, or evidence of vascular malformation. Broad appearance at the anterior communicating artery without discrete aneurysm. Essentially azygos A2 segment. Posterior circulation: Codominant vertebral arteries. Atheromatous plaque asymmetric to the left V4 segment. The vertebral and basilar arteries are smooth and widely patent. Fetal type left PCA. Negative for aneurysm or vascular malformation Venous sinuses: Unremarkable Anatomic variants: As above Review of the MIP images confirms the above findings IMPRESSION: 1. No arterial cause/injury seen underlying the traumatic pattern subarachnoid hemorrhage. 2. Cervical and intracranial carotid atherosclerosis with up to 50% stenosis at the  right cavernous ICA. 3. Layering right pleural effusion. 4. Complicated left sinusitis as previously described. Electronically Signed   By: Monte Fantasia M.D.   On: 03/20/2020 10:13   DG Chest 2 View  Result Date: 03/20/2020 CLINICAL DATA:  Assess for pleural effusion. EXAM: CHEST - 2 VIEW COMPARISON:  March 15, 2020 FINDINGS: The mediastinal contour and cardiac silhouette are stable. Heart size is enlarged. Mild increased pulmonary interstitium is identified bilaterally unchanged. There is minimal right pleural effusion. There is no left pleural effusion. The bony structures are stable. IMPRESSION: Minimal right pleural effusion. Electronically Signed   By: Abelardo Diesel M.D.   On: 03/20/2020 11:16   CT HEAD WO CONTRAST  Result Date: 03/20/2020 CLINICAL DATA:  Initial evaluation for acute mental status change. Worsening. EXAM: CT HEAD WITHOUT CONTRAST TECHNIQUE: Contiguous axial images were obtained from the base of the skull through the vertex without intravenous contrast. COMPARISON:  Prior head CT from earlier the same day. FINDINGS: Brain: Posttraumatic subarachnoid hemorrhage involving the peripheral left frontotemporal convexity again seen, not significantly changed in overall appearance and volume from previous. Suspected superimposed hemorrhagic contusion also similar. Overlying subdural hematoma also not significantly changed, measuring up to 5-6 mm in maximal thickness. Trace 2 mm left-to-right shift, unchanged. No hydrocephalus or ventricular trapping. Basilar cisterns remain patent. No new intracranial hemorrhage. No acute large vessel territory infarct. Underlying atrophy with chronic small vessel ischemic disease again noted. No appreciable mass lesion. Vascular: No asymmetric hyperdense vessel. Calcified atherosclerosis present at the skull base. Skull: Evolving right frontoparietal scalp contusion. Calvarium intact. Sinuses/Orbits: Globes and orbital soft tissues demonstrate no acute  finding. Chronic left frontoethmoidal and maxillary sinusitis again noted. Mastoids remain clear. Other: None. IMPRESSION: 1. No significant interval change in left frontotemporal convexity subarachnoid hemorrhage with probable superimposed hemorrhagic contusion. Overlying extra-axial hemorrhage also not significantly changed, likely subdural. Trace 2 mm left-to-right shift, unchanged. No hydrocephalus or ventricular trapping. 2. No other new acute intracranial abnormality. 3. Evolving right frontoparietal scalp contusion. No calvarial fracture. 4. Chronic left frontoethmoidal and maxillary sinusitis. Electronically Signed   By: Jeannine Boga M.D.   On:  03/20/2020 20:20   CT Head Wo Contrast  Result Date: 03/20/2020 CLINICAL DATA:  Follow-up of left temporal lobe subarachnoid and possible subdural hemorrhage. EXAM: CT HEAD WITHOUT CONTRAST TECHNIQUE: Contiguous axial images were obtained from the base of the skull through the vertex without intravenous contrast. COMPARISON:  Earlier today at 7:22 a.m. FINDINGS: 2:32 p.m. Brain: Cerebral atrophy. Mild increase in small volume subarachnoid hemorrhage adjacent the left temporal and frontal lobes, including on 23/2. Small volume subdural hemorrhage including at 4 mm on 14/2 is increased, especially superiorly. Example adjacent the left frontal lobe near the vertex on coronal image 24/4 at 3 mm. Adjacent the left parietal lobe at up to 5 mm on coronal image 43, new. No significant mass effect or midline shift. No complicating infarct. No hydrocephalus or intraventricular hemorrhage. Mild low density in the periventricular white matter likely related to small vessel disease. Vascular: Intracranial atherosclerosis. Skull: Posterior scalp soft tissue swelling is greater right than left and relatively similar. No skull fracture. Sinuses/Orbits: Normal imaged portions of the orbits and globes. Left sided sinus opacification was detailed on CT of earlier today.  Other: None. IMPRESSION: 1. Mild increase in subarachnoid hemorrhage adjacent the left temporal and frontal lobes. 2. Small volume extra-axial hematoma, favoring subdural, mildly increased especially superiorly. 3.  Cerebral atrophy and small vessel ischemic change. Electronically Signed   By: Abigail Miyamoto M.D.   On: 03/20/2020 15:02   CT Head Wo Contrast  Result Date: 03/20/2020 CLINICAL DATA:  Fall with facial trauma EXAM: CT HEAD WITHOUT CONTRAST CT MAXILLOFACIAL WITHOUT CONTRAST CT CERVICAL SPINE WITHOUT CONTRAST TECHNIQUE: Multidetector CT imaging of the head, cervical spine, and maxillofacial structures were performed using the standard protocol without intravenous contrast. Multiplanar CT image reconstructions of the cervical spine and maxillofacial structures were also generated. COMPARISON:  01/25/2020 FINDINGS: CT HEAD FINDINGS Brain: High-density/acute hemorrhage in the subarachnoid and possibly subdural spaces over the lateral left temporal lobe, in total measuring up to 3 mm in thickness. No associated mass effect. No visible parenchymal hemorrhage or swelling. Brain atrophy and chronic small vessel ischemia. No evidence of acute infarct. 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 Vascular: No hyperdense vessel or unexpected calcification. Skull: Posterior scalp swelling without calvarial fracture. CT MAXILLOFACIAL FINDINGS Osseous: No acute fracture or mandibular dislocation. Orbits: Swelling superficial to the right orbit. No visible globe or postseptal injury. Bilateral cataract resection. 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 Sinuses: Chronic sinusitis with opacified left maxillary, anterior ethmoid, and frontal sinuses. Left maxillary wall is sclerotic and thickened with a broad dehiscence along the posterolateral wall that is traversed by partially calcified material. No sinus mass was seen on a April 07, 2009 brain MRI, but  dehiscence had not been noted at that time either. Soft tissues: Hazy retro antral fat on the left, stable where covered on prior head CT 01/25/2020. CT CERVICAL SPINE FINDINGS Alignment: No traumatic malalignment. Skull base and vertebrae: Negative for cervical spine fracture. T1 and T2 superior endplate fractures with trabecular impaction, nonacute in seen on a 2021 chest CT. Soft tissues and spinal canal: No prevertebral fluid or swelling. No visible canal hematoma. Mildly enlarged lymph nodes in the upper right jugular chain, posterior to the internal jugular vein, unchanged from November 2021 and thus presumably reactive Disc levels: Multilevel degenerative facet spurring asymmetric to the right. Ordinary mid and lower cervical disc degeneration. Upper chest: Layering right pleural effusion, small where covered. Critical Value/emergent results were called  by telephone at the time of interpretation on 03/20/2020 at 8:00 am to provider Lavonia Drafts , who verbally acknowledged these results. IMPRESSION: 1. Small volume subarachnoid and possibly subdural hemorrhage along the superficial left temporal lobe. No significant mass effect. 2. Scalp and facial swelling without acute fracture. 3. Negative for cervical spine fracture. 4. 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 ENT referral is recommended if appropriate for comorbidities. Electronically Signed   By: Monte Fantasia M.D.   On: 03/20/2020 08:07   CT Angio Neck W and/or Wo Contrast  Result Date: 03/20/2020 CLINICAL DATA:  Head trauma with intracranial arterial injury suspected EXAM: CT ANGIOGRAPHY HEAD AND NECK TECHNIQUE: Multidetector CT imaging of the head and neck was performed using the standard protocol during bolus administration of intravenous contrast. Multiplanar CT image  reconstructions and MIPs were obtained to evaluate the vascular anatomy. Carotid stenosis measurements (when applicable) are obtained utilizing NASCET criteria, using the distal internal carotid diameter as the denominator. CONTRAST:  8mL OMNIPAQUE IOHEXOL 350 MG/ML SOLN COMPARISON:  Head CT from earlier today. FINDINGS: CTA NECK FINDINGS Aortic arch: Atheromatous calcification.  No aneurysm or dissection Right carotid system: Atheromatous wall thickening with calcification at the bifurcation. No stenosis or ulceration. Left carotid system: Atheromatous wall thickening of the common carotid. No flow limiting stenosis or ulceration. Vertebral arteries: No proximal subclavian stenosis. There are codominant vertebral arteries that are smooth and widely patent to the dura. Skeleton: No acute finding. Other neck: Left-sided sinusitis with OMU obstructive pattern and cortical breakthrough at the posterior wall left maxillary sinus, described on preceding head CT. Upper chest: Moderate, layering right pleural effusion. Generous mediastinal lymph nodes, likely reactive. Review of the MIP images confirms the above findings CTA HEAD FINDINGS Anterior circulation: Heavily calcified carotid siphons with up to 50% stenosis on the right. No branch occlusion, beading, aneurysm, or evidence of vascular malformation. Broad appearance at the anterior communicating artery without discrete aneurysm. Essentially azygos A2 segment. Posterior circulation: Codominant vertebral arteries. Atheromatous plaque asymmetric to the left V4 segment. The vertebral and basilar arteries are smooth and widely patent. Fetal type left PCA. Negative for aneurysm or vascular malformation Venous sinuses: Unremarkable Anatomic variants: As above Review of the MIP images confirms the above findings IMPRESSION: 1. No arterial cause/injury seen underlying the traumatic pattern subarachnoid hemorrhage. 2. Cervical and intracranial carotid atherosclerosis with  up to 50% stenosis at the right cavernous ICA. 3. Layering right pleural effusion. 4. Complicated left sinusitis as previously described. Electronically Signed   By: Monte Fantasia M.D.   On: 03/20/2020 10:13   CT Cervical Spine Wo Contrast  Result Date: 03/20/2020 CLINICAL DATA:  Fall with facial trauma EXAM: CT HEAD WITHOUT CONTRAST CT MAXILLOFACIAL WITHOUT CONTRAST CT CERVICAL SPINE WITHOUT CONTRAST TECHNIQUE: Multidetector CT imaging of the head, cervical spine, and maxillofacial structures were performed using the standard protocol without intravenous contrast. Multiplanar CT image reconstructions of the cervical spine and maxillofacial structures were also generated. COMPARISON:  01/25/2020 FINDINGS: CT HEAD FINDINGS Brain: High-density/acute hemorrhage in the subarachnoid and possibly subdural spaces over the lateral left temporal lobe, in total measuring up to 3 mm in thickness. No associated mass effect. No visible parenchymal hemorrhage or swelling. Brain atrophy and chronic small vessel ischemia. No evidence of acute infarct. 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 Vascular: No hyperdense vessel  or unexpected calcification. Skull: Posterior scalp swelling without calvarial fracture. CT MAXILLOFACIAL FINDINGS Osseous: No acute fracture or mandibular dislocation. Orbits: Swelling superficial to the right orbit. No visible globe or postseptal injury. Bilateral cataract resection. 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 Sinuses: Chronic sinusitis with opacified left maxillary, anterior ethmoid, and frontal sinuses. Left maxillary wall is sclerotic and thickened with a broad dehiscence along the posterolateral wall that is traversed by partially calcified material. No sinus mass was seen on a April 07, 2009 brain MRI, but dehiscence had not been noted at that time either. Soft tissues: Hazy retro antral fat on the left, stable where covered on  prior head CT 01/25/2020. CT CERVICAL SPINE FINDINGS Alignment: No traumatic malalignment. Skull base and vertebrae: Negative for cervical spine fracture. T1 and T2 superior endplate fractures with trabecular impaction, nonacute in seen on a 2021 chest CT. Soft tissues and spinal canal: No prevertebral fluid or swelling. No visible canal hematoma. Mildly enlarged lymph nodes in the upper right jugular chain, posterior to the internal jugular vein, unchanged from November 2021 and thus presumably reactive Disc levels: Multilevel degenerative facet spurring asymmetric to the right. Ordinary mid and lower cervical disc degeneration. Upper chest: Layering right pleural effusion, small where covered. Critical Value/emergent results were called by telephone at the time of interpretation on 03/20/2020 at 8:00 am to provider Lavonia Drafts , who verbally acknowledged these results. IMPRESSION: 1. Small volume subarachnoid and possibly subdural hemorrhage along the superficial left temporal lobe. No significant mass effect. 2. Scalp and facial swelling without acute fracture. 3. Negative for cervical spine fracture. 4. 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 ENT referral is recommended if appropriate for comorbidities. Electronically Signed   By: Monte Fantasia M.D.   On: 03/20/2020 08:07   DG Chest Portable 1 View  Result Date: 03/15/2020 CLINICAL DATA:  Altered mental status. EXAM: PORTABLE CHEST 1 VIEW COMPARISON:  January 25, 2020 FINDINGS: Chronic appearing increased lung markings, without evidence of acute infiltrate, pleural effusion or pneumothorax. The cardiac silhouette is moderately enlarged. Is marked severity calcification of the aortic arch. Chronic right-sided rib fractures are seen. Multilevel degenerative changes are noted  throughout the thoracic spine. IMPRESSION: Stable cardiomegaly with chronic appearing increased lung markings. Electronically Signed   By: Virgina Norfolk M.D.   On: 03/15/2020 20:56   CT Maxillofacial Wo Contrast  Result Date: 03/20/2020 CLINICAL DATA:  Fall with facial trauma EXAM: CT HEAD WITHOUT CONTRAST CT MAXILLOFACIAL WITHOUT CONTRAST CT CERVICAL SPINE WITHOUT CONTRAST TECHNIQUE: Multidetector CT imaging of the head, cervical spine, and maxillofacial structures were performed using the standard protocol without intravenous contrast. Multiplanar CT image reconstructions of the cervical spine and maxillofacial structures were also generated. COMPARISON:  01/25/2020 FINDINGS: CT HEAD FINDINGS Brain: High-density/acute hemorrhage in the subarachnoid and possibly subdural spaces over the lateral left temporal lobe, in total measuring up to 3 mm in thickness. No associated mass effect. No visible parenchymal hemorrhage or swelling. Brain atrophy and chronic small vessel ischemia. No evidence of acute infarct. 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 Vascular: No hyperdense vessel or unexpected calcification. Skull: Posterior scalp swelling without calvarial fracture. CT MAXILLOFACIAL FINDINGS Osseous: No acute fracture or mandibular dislocation. Orbits: Swelling superficial to the right orbit. No visible globe or postseptal injury. Bilateral cataract resection.  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 Sinuses: Chronic sinusitis with opacified left maxillary, anterior ethmoid, and frontal sinuses. Left maxillary wall is sclerotic and thickened with a broad dehiscence along the posterolateral wall that is traversed by partially calcified material. No sinus mass was seen on a April 07, 2009 brain MRI, but dehiscence had not been noted at that time either. Soft tissues: Hazy retro antral fat on the left, stable where covered on prior head CT 01/25/2020.  CT CERVICAL SPINE FINDINGS Alignment: No traumatic malalignment. Skull base and vertebrae: Negative for cervical spine fracture. T1 and T2 superior endplate fractures with trabecular impaction, nonacute in seen on a 2021 chest CT. Soft tissues and spinal canal: No prevertebral fluid or swelling. No visible canal hematoma. Mildly enlarged lymph nodes in the upper right jugular chain, posterior to the internal jugular vein, unchanged from November 2021 and thus presumably reactive Disc levels: Multilevel degenerative facet spurring asymmetric to the right. Ordinary mid and lower cervical disc degeneration. Upper chest: Layering right pleural effusion, small where covered. Critical Value/emergent results were called by telephone at the time of interpretation on 03/20/2020 at 8:00 am to provider Lavonia Drafts , who verbally acknowledged these results. IMPRESSION: 1. Small volume subarachnoid and possibly subdural hemorrhage along the superficial left temporal lobe. No significant mass effect. 2. Scalp and facial swelling without acute fracture. 3. Negative for cervical spine fracture. 4. 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 ENT referral is recommended if appropriate for comorbidities. Electronically Signed   By: Monte Fantasia M.D.   On: 03/20/2020 08:07    EKG: Normal sinus rhythm with left axis deviation and right bundle branch block  Weights: Filed Weights   03/20/20 0705  Weight: 79.8 kg     Physical Exam: Blood pressure (S) (!) 173/91, pulse 79, temperature 97.9 F (36.6 C), temperature source Oral, resp. rate 16, height 6' (1.829 m), weight 79.8 kg, SpO2 96 %. Body mass index is 23.87 kg/m. General: Well developed, well nourished, in no acute distress. Head eyes ears nose throat: Normocephalic, atraumatic, sclera  non-icteric, no xanthomas, nares are without discharge. No apparent thyromegaly and/or mass  Lungs: Normal respiratory effort.  no wheezes, no rales, no rhonchi.  Heart: RRR with normal S1 S2. no murmur gallop, no rub, PMI is normal size and placement, carotid upstroke normal without bruit, jugular venous pressure is normal Abdomen: Soft, non-tender, non-distended with normoactive bowel sounds. No hepatomegaly. No rebound/guarding. No obvious abdominal masses. Abdominal aorta is normal size without bruit Extremities: No edema. no cyanosis, no clubbing, no ulcers  Peripheral : 2+ bilateral upper extremity pulses, 2+ bilateral femoral pulses, 2+ bilateral dorsal pedal pulse Neuro: Alert and oriented. No facial asymmetry. No focal deficit. Moves all extremities spontaneously. Musculoskeletal: Normal muscle tone without kyphosis Psych:  Responds to questions appropriately with a normal affect.    Assessment: 70 year old male with previous cerebrovascular accident peripheral vascular disease sleep apnea diabetes chronic kidney disease stage V chronic anemia with pleural effusion having recurrent episodes of falling and injury of unknown etiology without evidence of primary source and no current evidence of congestive heart failure myocardial infarction or acute coronary syndrome  Plan: 1.  Continue supportive care of recent injury to right face 2.  Further consideration of Linq device placed for assessment of rhythm disturbances  that cannot be seen by previous monitoring.  Would plan on trying to schedule this for Tuesday 3.  Continue treatment with dialysis without restriction 4.  Continuation of metoprolol for heart rate and blood pressure control and high intensity cholesterol therapy for further risk reduction cardiovascular event with previous history of cerebrovascular accident  Signed, Corey Skains M.D. [facility] 03/21/2020, 7:45 AM

## 2020-03-21 NOTE — Consult Note (Signed)
Neurosurgery-New Consultation Evaluation 03/21/2020 Kyle [name] [id number]  Identifying Statement: Kyle [name] is a 70 y.o. male from [place] [id number] with fall and intracranial hemorrhage  Physician Requesting Consultation: Marjean Donna, ED  History of Present Illness: Mr. Willemsen is admitted to the hospital after presenting with concerns for weakness and dizziness as he did miss his regular scheduled dialysis.  He does have some noted bruising to his right orbital area.  He is confused on exam unable to provide direct history information.  He does not endorse any current headaches.  Given this, he did have a CT scan of the head performed and there is concern for left-sided contusion and subarachnoid hemorrhage.  He is noted to be on Plavix.  Given the contusion and acute hemorrhage, we are consulted for evaluation.  Past Medical History:  Past Medical History:  Diagnosis Date  . Anemia   . Anxiety   . BPH (benign prostatic hyperplasia)   . Chronic kidney disease   . Colon polyps   . COPD (chronic obstructive pulmonary disease) ([place])   . Depression   . Diabetes mellitus without complication ([place])   . Diverticulosis of colon   . History of kidney stones   . Hyperlipidemia   . Hypertension   . Hypothyroidism   . Nephrolithiasis   . Seizures ([place])    2015 last seizure  . Stroke [facility])     Social History: Social History   Socioeconomic History  . Marital status: Married    Spouse name: Not on file  . Number of children: Not on file  . Years of education: Not on file  . Highest education level: Not on file  Occupational History  . Not on file  Tobacco Use  . Smoking status: Light Tobacco Smoker    Packs/day: 0.25    Years: 40.00    Pack years: 10.00    Types: Cigarettes    Last attempt to quit: 05/25/2016    Years since quitting: 3.8  . Smokeless tobacco: Never Used  Vaping Use  . Vaping Use: Never used  Substance and Sexual Activity  . Alcohol  use: Not Currently  . Drug use: No  . Sexual activity: Not on file  Other Topics Concern  . Not on file  Social History Narrative  . Not on file   Social Determinants of Health   Financial Resource Strain: Not on file  Food Insecurity: Not on file  Transportation Needs: Not on file  Physical Activity: Not on file  Stress: Not on file  Social Connections: Not on file  Intimate Partner Violence: Not on file    Family History: Family History  Problem Relation Age of Onset  . Diabetes Mother   . Diabetes Maternal Grandmother   . Diabetes Maternal Grandfather   . Lung cancer Father   . Emphysema Paternal Grandfather     Review of Systems:  Review of Systems - General ROS: Negative Psychological ROS: Negative Ophthalmic ROS: Negative ENT ROS: Negative Hematological and Lymphatic ROS: Negative  Endocrine ROS: Negative Respiratory ROS: Negative Cardiovascular ROS: Negative Gastrointestinal ROS: Negative Genito-Urinary ROS: Negative Musculoskeletal ROS: Negative Neurological ROS: Positive for weakness, confusion Dermatological ROS: Negative  Physical Exam: BP (!) 186/84   Pulse 83   Temp 99 F (37.2 C) (Oral)   Resp 18   Ht 6' (1.829 m)   Wt 79.8 kg   SpO2 96%   BMI 23.87 kg/m  Body mass index is 23.87 kg/m. Body surface area is  2.01 meters squared. General appearance: Alert but confused to questioning Head: Normocephalic Eyes: Obvious ecchymosis around the right orbit Oropharynx: Wearing facemask Ext: No edema in LE bilaterally, good distal pulses  Neurologic exam:  Mental status: alertness: alert, orientation: Could not state his last name, place, or time.  Affect: normal Speech: Slightly dysarthric speech, nonsensical speech at times Cranial nerves:  II: Visual fields appear full III/IV/VI: extra-ocular motions intact bilaterally V/VII:no evidence of facial droop or weakness  Motor: Does not participate in formal strength testing but is moving all  extremities Gait: Not tested  Laboratory: Results for orders placed or performed during the hospital encounter of 03/20/20  SARS Coronavirus 2 by RT PCR (hospital order, performed in [place] hospital lab) Nasopharyngeal Nasopharyngeal Swab   Specimen: Nasopharyngeal Swab  Result Value Ref Range   SARS Coronavirus 2 NEGATIVE NEGATIVE  Basic metabolic panel  Result Value Ref Range   Sodium 138 135 - 145 mmol/L   Potassium 4.3 3.5 - 5.1 mmol/L   Chloride 98 98 - 111 mmol/L   CO2 26 22 - 32 mmol/L   Glucose, Bld 209 (H) 70 - 99 mg/dL   BUN 30 (H) 8 - 23 mg/dL   Creatinine, Ser 6.78 (H) 0.61 - 1.24 mg/dL   Calcium 7.0 (L) 8.9 - 10.3 mg/dL   GFR, Estimated 8 (L) >60 mL/min   Anion gap 14 5 - 15  CBC  Result Value Ref Range   WBC 5.4 4.0 - 10.5 K/uL   RBC 3.36 (L) 4.22 - 5.81 MIL/uL   Hemoglobin 10.8 (L) 13.0 - 17.0 g/dL   HCT 33.4 (L) 39.0 - 52.0 %   MCV 99.4 80.0 - 100.0 fL   MCH 32.1 26.0 - 34.0 pg   MCHC 32.3 30.0 - 36.0 g/dL   RDW 14.5 11.5 - 15.5 %   Platelets 140 (L) 150 - 400 K/uL   nRBC 0.0 0.0 - 0.2 %  Protime-INR  Result Value Ref Range   Prothrombin Time 13.6 11.4 - 15.2 seconds   INR 1.1 0.8 - 1.2  Basic metabolic panel  Result Value Ref Range   Sodium 137 135 - 145 mmol/L   Potassium 4.6 3.5 - 5.1 mmol/L   Chloride 96 (L) 98 - 111 mmol/L   CO2 23 22 - 32 mmol/L   Glucose, Bld 146 (H) 70 - 99 mg/dL   BUN 37 (H) 8 - 23 mg/dL   Creatinine, Ser 7.74 (H) 0.61 - 1.24 mg/dL   Calcium 7.3 (L) 8.9 - 10.3 mg/dL   GFR, Estimated 7 (L) >60 mL/min   Anion gap 18 (H) 5 - 15  CBC  Result Value Ref Range   WBC 5.8 4.0 - 10.5 K/uL   RBC 3.20 (L) 4.22 - 5.81 MIL/uL   Hemoglobin 10.0 (L) 13.0 - 17.0 g/dL   HCT 31.7 (L) 39.0 - 52.0 %   MCV 99.1 80.0 - 100.0 fL   MCH 31.3 26.0 - 34.0 pg   MCHC 31.5 30.0 - 36.0 g/dL   RDW 14.5 11.5 - 15.5 %   Platelets 142 (L) 150 - 400 K/uL   nRBC 0.0 0.0 - 0.2 %  CBG monitoring, ED  Result Value Ref Range   Glucose-Capillary 165  (H) 70 - 99 mg/dL  CBG monitoring, ED  Result Value Ref Range   Glucose-Capillary 150 (H) 70 - 99 mg/dL  Type and screen Advance  Result Value Ref Range   ABO/RH(D) O POS  Antibody Screen NEG    Sample Expiration      03/23/2020,2359 Performed at [facility], [address] [place], [place] [address]    I personally reviewed labs  Imaging: CT head:1. Mild increase in subarachnoid hemorrhage adjacent the left temporal and frontal lobes. 2. Small volume extra-axial hematoma, favoring subdural, mildly increased especially superiorly. 3.  Cerebral atrophy and small vessel ischemic change.   CTA head:1. No arterial cause/injury seen underlying the traumatic pattern subarachnoid hemorrhage. 2. Cervical and intracranial carotid atherosclerosis with up to 50% stenosis at the right cavernous ICA. 3. Layering right pleural effusion. 4. Complicated left sinusitis as previously described.   Impression/Plan:  Mr. Paluch is here for evaluation of what appears to be a traumatic subarachnoid hemorrhage and contusion.  CTA was negative.  Given the small volume, would recommend holding the Plavix for 7 days but no need for platelet transfusion.  He is okay for DVT prophylaxis in 48 hours.  Given confusion, would hold off on seizure prophylaxis at this time but patient should be monitored.  I would recommend repeat CT of the head should he have any decline in neurologic exam.   1.  Diagnosis: Traumatic subarachnoid hemorrhage, contusion  2.  Plan -Hold Plavix for 7 days -Hold DVT prophylaxis for 48 hours after CT scan of the head -We can follow-up in clinic and 2 to 3 weeks for reevaluation

## 2020-03-21 NOTE — ED Notes (Addendum)
[facility] NP aware of BP, no new orders at this time.

## 2020-03-21 NOTE — Progress Notes (Signed)
PT Cancellation Note  Patient Details Name: Kyle [name] MRN: [id number] DOB: [birth year]   Cancelled Treatment:    Reason Eval/Treat Not Completed: Other (comment) PT orders received, chart reviewed. Pt currently off the floor at dialysis. Will re-attempt to see pt later as time allows.   Lavone Nian, PT, DPT 03/21/20, 11:52 AM    Waunita Schooner 03/21/2020, 11:48 AM

## 2020-03-21 NOTE — ED Notes (Signed)
Per Dr Dwyane Dee will call and update wife [name] at [phone]

## 2020-03-21 NOTE — Progress Notes (Signed)
Patient wife notified via phone call that patient has been admitted to room 253. Wife given patient room number and phone number to room. Updated on patient's orientation status. Wife has no further questions at this time.

## 2020-03-21 NOTE — Progress Notes (Signed)
[facility]  ROUNDING NOTE   Subjective:     HEMODIALYSIS FLOWSHEET:  Blood Flow Rate (mL/min): 400 mL/min Arterial Pressure (mmHg): -190 mmHg Venous Pressure (mmHg): 190 mmHg Transmembrane Pressure (mmHg): 60 mmHg Ultrafiltration Rate (mL/min): 400 mL/min Dialysate Flow Rate (mL/min): 600 ml/min Conductivity: Machine : 13.9 Conductivity: Machine : 13.9 Dialysis Fluid Bolus: Normal Saline Bolus Amount (mL): 250 mL    Objective:  Vital signs in last 24 hours:  Temp:  [99 F (37.2 C)] 99 F (37.2 C) (01/24 1122) Pulse Rate:  [72-88] 81 (01/24 1300) Resp:  [14-18] 18 (01/24 1300) BP: (143-188)/(55-94) 188/73 (01/24 1300) SpO2:  [96 %-99 %] 96 % (01/24 0605)  Weight change:  Filed Weights   03/20/20 0705  Weight: 79.8 kg    Intake/Output: No intake/output data recorded.   Intake/Output this shift:  No intake/output data recorded.  Physical Exam: General: NAD  Head: Normocephalic, atraumatic. Moist oral mucosal membranes  Eyes: + large ecchymosis around right eye   Neck: Supple, trachea midline  Lungs:  Clear to auscultation  Heart: Regular rate and rhythm  Abdomen:  Soft, nontender,   Extremities:  No peripheral edema.  Neurologic: Nonfocal, moving all four extremities  Skin: abrasions on his right arm   Access: Left upper extremity AVF, +thrill      Basic Metabolic Panel: Recent Labs  Lab 03/15/20 1653 03/15/20 2230 03/16/20 0520 03/16/20 1202 03/16/20 2117 03/17/20 0340 03/20/20 0717 03/21/20 0340  NA 134*  --  137  --   --  137 138 137  K 6.1*   < > 4.6 4.8  --  4.0 4.3 4.6  CL 93*  --  97*  --   --  96* 98 96*  CO2 20*  --  22  --   --  28 26 23   GLUCOSE 200*  --  105*  --   --  123* 209* 146*  BUN 50*  --  53*  --   --  32* 30* 37*  CREATININE 8.64*  --  9.06*  --   --  6.20* 6.78* 7.74*  CALCIUM 6.9*  --  6.7*  --   --  7.8* 7.0* 7.3*  PHOS  --   --   --   --  4.8*  --   --   --    < > = values in this interval not displayed.     Liver Function Tests: Recent Labs  Lab 03/17/20 0340  AST 35  ALT 30  ALKPHOS 110  BILITOT 1.0  PROT 6.8  ALBUMIN 3.4*   No results for input(s): LIPASE, AMYLASE in the last 168 hours. No results for input(s): AMMONIA in the last 168 hours.  CBC: Recent Labs  Lab 03/15/20 1653 03/16/20 0520 03/17/20 0340 03/20/20 0717 03/21/20 0340  WBC 7.2 5.7 4.8 5.4 5.8  NEUTROABS 5.3  --   --   --   --   HGB 10.8* 10.1* 11.0* 10.8* 10.0*  HCT 35.5* 31.3* 34.7* 33.4* 31.7*  MCV 101.1* 97.5 98.0 99.4 99.1  PLT 170 144* 151 140* 142*    Cardiac Enzymes: Recent Labs  Lab 03/17/20 0340  CKTOTAL 95    BNP: Invalid input(s): POCBNP  CBG: Recent Labs  Lab 03/15/20 2109 03/15/20 2227 03/17/20 1213 03/20/20 1651 03/21/20 0750  GLUCAP 120* 106* 120* 165* 150*    Microbiology: Results for orders placed or performed during the hospital encounter of 03/20/20  SARS Coronavirus 2 by RT PCR (hospital  order, performed in [facility] hospital lab) Nasopharyngeal Nasopharyngeal Swab     Status: None   Collection Time: 03/20/20  9:04 AM   Specimen: Nasopharyngeal Swab  Result Value Ref Range Status   SARS Coronavirus 2 NEGATIVE NEGATIVE Final    Comment: (NOTE) SARS-CoV-2 target nucleic acids are NOT DETECTED.  The SARS-CoV-2 RNA is generally detectable in upper and lower respiratory specimens during the acute phase of infection. The lowest concentration of SARS-CoV-2 viral copies this assay can detect is 250 copies / mL. A negative result does not preclude SARS-CoV-2 infection and should not be used as the sole basis for treatment or other patient management decisions.  A negative result may occur with improper specimen collection / handling, submission of specimen other than nasopharyngeal swab, presence of viral mutation(s) within the areas targeted by this assay, and inadequate number of viral copies (<250 copies / mL). A negative result must be combined with  clinical observations, patient history, and epidemiological information.  Fact Sheet for Patients:   StrictlyIdeas.no  Fact Sheet for Healthcare Providers: BankingDealers.co.za  This test is not yet approved or  cleared by the [place] FDA and has been authorized for detection and/or diagnosis of SARS-CoV-2 by FDA under an Emergency Use Authorization (EUA).  This EUA will remain in effect (meaning this test can be used) for the duration of the COVID-19 declaration under Section 564(b)(1) of the Act, 21 U.S.C. section 360bbb-3(b)(1), unless the authorization is terminated or revoked sooner.  Performed at [facility], [address] [place], [place] [address]     Coagulation Studies: Recent Labs    03/20/20 0904  LABPROT 13.6  INR 1.1    Urinalysis: No results for input(s): COLORURINE, LABSPEC, PHURINE, GLUCOSEU, HGBUR, BILIRUBINUR, KETONESUR, PROTEINUR, UROBILINOGEN, NITRITE, LEUKOCYTESUR in the last 72 hours.  Invalid input(s): APPERANCEUR    Imaging: CT Angio Head W or Wo Contrast  Result Date: 03/20/2020 CLINICAL DATA:  Head trauma with intracranial arterial injury suspected EXAM: CT ANGIOGRAPHY HEAD AND NECK TECHNIQUE: Multidetector CT imaging of the head and neck was performed using the standard protocol during bolus administration of intravenous contrast. Multiplanar CT image reconstructions and MIPs were obtained to evaluate the vascular anatomy. Carotid stenosis measurements (when applicable) are obtained utilizing NASCET criteria, using the distal internal carotid diameter as the denominator. CONTRAST:  74mL OMNIPAQUE IOHEXOL 350 MG/ML SOLN COMPARISON:  Head CT from earlier today. FINDINGS: CTA NECK FINDINGS Aortic arch: Atheromatous calcification.  No aneurysm or dissection Right carotid system: Atheromatous wall thickening with calcification at the bifurcation. No stenosis or ulceration. Left carotid  system: Atheromatous wall thickening of the common carotid. No flow limiting stenosis or ulceration. Vertebral arteries: No proximal subclavian stenosis. There are codominant vertebral arteries that are smooth and widely patent to the dura. Skeleton: No acute finding. Other neck: Left-sided sinusitis with OMU obstructive pattern and cortical breakthrough at the posterior wall left maxillary sinus, described on preceding head CT. Upper chest: Moderate, layering right pleural effusion. Generous mediastinal lymph nodes, likely reactive. Review of the MIP images confirms the above findings CTA HEAD FINDINGS Anterior circulation: Heavily calcified carotid siphons with up to 50% stenosis on the right. No branch occlusion, beading, aneurysm, or evidence of vascular malformation. Broad appearance at the anterior communicating artery without discrete aneurysm. Essentially azygos A2 segment. Posterior circulation: Codominant vertebral arteries. Atheromatous plaque asymmetric to the left V4 segment. The vertebral and basilar arteries are smooth and widely patent. Fetal type left PCA. Negative for aneurysm or  vascular malformation Venous sinuses: Unremarkable Anatomic variants: As above Review of the MIP images confirms the above findings IMPRESSION: 1. No arterial cause/injury seen underlying the traumatic pattern subarachnoid hemorrhage. 2. Cervical and intracranial carotid atherosclerosis with up to 50% stenosis at the right cavernous ICA. 3. Layering right pleural effusion. 4. Complicated left sinusitis as previously described. Electronically Signed   By: Monte Fantasia M.D.   On: 03/20/2020 10:13   DG Chest 2 View  Result Date: 03/20/2020 CLINICAL DATA:  Assess for pleural effusion. EXAM: CHEST - 2 VIEW COMPARISON:  March 15, 2020 FINDINGS: The mediastinal contour and cardiac silhouette are stable. Heart size is enlarged. Mild increased pulmonary interstitium is identified bilaterally unchanged. There is minimal  right pleural effusion. There is no left pleural effusion. The bony structures are stable. IMPRESSION: Minimal right pleural effusion. Electronically Signed   By: Abelardo Diesel M.D.   On: 03/20/2020 11:16   CT HEAD WO CONTRAST  Result Date: 03/20/2020 CLINICAL DATA:  Initial evaluation for acute mental status change. Worsening. EXAM: CT HEAD WITHOUT CONTRAST TECHNIQUE: Contiguous axial images were obtained from the base of the skull through the vertex without intravenous contrast. COMPARISON:  Prior head CT from earlier the same day. FINDINGS: Brain: Posttraumatic subarachnoid hemorrhage involving the peripheral left frontotemporal convexity again seen, not significantly changed in overall appearance and volume from previous. Suspected superimposed hemorrhagic contusion also similar. Overlying subdural hematoma also not significantly changed, measuring up to 5-6 mm in maximal thickness. Trace 2 mm left-to-right shift, unchanged. No hydrocephalus or ventricular trapping. Basilar cisterns remain patent. No new intracranial hemorrhage. No acute large vessel territory infarct. Underlying atrophy with chronic small vessel ischemic disease again noted. No appreciable mass lesion. Vascular: No asymmetric hyperdense vessel. Calcified atherosclerosis present at the skull base. Skull: Evolving right frontoparietal scalp contusion. Calvarium intact. Sinuses/Orbits: Globes and orbital soft tissues demonstrate no acute finding. Chronic left frontoethmoidal and maxillary sinusitis again noted. Mastoids remain clear. Other: None. IMPRESSION: 1. No significant interval change in left frontotemporal convexity subarachnoid hemorrhage with probable superimposed hemorrhagic contusion. Overlying extra-axial hemorrhage also not significantly changed, likely subdural. Trace 2 mm left-to-right shift, unchanged. No hydrocephalus or ventricular trapping. 2. No other new acute intracranial abnormality. 3. Evolving right frontoparietal  scalp contusion. No calvarial fracture. 4. Chronic left frontoethmoidal and maxillary sinusitis. Electronically Signed   By: Jeannine Boga M.D.   On: 03/20/2020 20:20   CT Head Wo Contrast  Result Date: 03/20/2020 CLINICAL DATA:  Follow-up of left temporal lobe subarachnoid and possible subdural hemorrhage. EXAM: CT HEAD WITHOUT CONTRAST TECHNIQUE: Contiguous axial images were obtained from the base of the skull through the vertex without intravenous contrast. COMPARISON:  Earlier today at 7:22 a.m. FINDINGS: 2:32 p.m. Brain: Cerebral atrophy. Mild increase in small volume subarachnoid hemorrhage adjacent the left temporal and frontal lobes, including on 23/2. Small volume subdural hemorrhage including at 4 mm on 14/2 is increased, especially superiorly. Example adjacent the left frontal lobe near the vertex on coronal image 24/4 at 3 mm. Adjacent the left parietal lobe at up to 5 mm on coronal image 43, new. No significant mass effect or midline shift. No complicating infarct. No hydrocephalus or intraventricular hemorrhage. Mild low density in the periventricular white matter likely related to small vessel disease. Vascular: Intracranial atherosclerosis. Skull: Posterior scalp soft tissue swelling is greater right than left and relatively similar. No skull fracture. Sinuses/Orbits: Normal imaged portions of the orbits and globes. Left sided sinus opacification was detailed on CT of  earlier today. Other: None. IMPRESSION: 1. Mild increase in subarachnoid hemorrhage adjacent the left temporal and frontal lobes. 2. Small volume extra-axial hematoma, favoring subdural, mildly increased especially superiorly. 3.  Cerebral atrophy and small vessel ischemic change. Electronically Signed   By: Abigail Miyamoto M.D.   On: 03/20/2020 15:02   CT Head Wo Contrast  Result Date: 03/20/2020 CLINICAL DATA:  Fall with facial trauma EXAM: CT HEAD WITHOUT CONTRAST CT MAXILLOFACIAL WITHOUT CONTRAST CT CERVICAL SPINE  WITHOUT CONTRAST TECHNIQUE: Multidetector CT imaging of the head, cervical spine, and maxillofacial structures were performed using the standard protocol without intravenous contrast. Multiplanar CT image reconstructions of the cervical spine and maxillofacial structures were also generated. COMPARISON:  01/25/2020 FINDINGS: CT HEAD FINDINGS Brain: High-density/acute hemorrhage in the subarachnoid and possibly subdural spaces over the lateral left temporal lobe, in total measuring up to 3 mm in thickness. No associated mass effect. No visible parenchymal hemorrhage or swelling. Brain atrophy and chronic small vessel ischemia. No evidence of acute infarct. 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 Vascular: No hyperdense vessel or unexpected calcification. Skull: Posterior scalp swelling without calvarial fracture. CT MAXILLOFACIAL FINDINGS Osseous: No acute fracture or mandibular dislocation. Orbits: Swelling superficial to the right orbit. No visible globe or postseptal injury. Bilateral cataract resection. 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 Sinuses: Chronic sinusitis with opacified left maxillary, anterior ethmoid, and frontal sinuses. Left maxillary wall is sclerotic and thickened with a broad dehiscence along the posterolateral wall that is traversed by partially calcified material. No sinus mass was seen on a April 07, 2009 brain MRI, but dehiscence had not been noted at that time either. Soft tissues: Hazy retro antral fat on the left, stable where covered on prior head CT 01/25/2020. CT CERVICAL SPINE FINDINGS Alignment: No traumatic malalignment. Skull base and vertebrae: Negative for cervical spine fracture. T1 and T2 superior endplate fractures with trabecular impaction, nonacute in seen on a 2021 chest CT. Soft tissues and spinal canal: No prevertebral fluid or swelling. No visible canal hematoma. Mildly enlarged lymph nodes in the upper  right jugular chain, posterior to the internal jugular vein, unchanged from November 2021 and thus presumably reactive Disc levels: Multilevel degenerative facet spurring asymmetric to the right. Ordinary mid and lower cervical disc degeneration. Upper chest: Layering right pleural effusion, small where covered. Critical Value/emergent results were called by telephone at the time of interpretation on 03/20/2020 at 8:00 am to provider Lavonia Drafts , who verbally acknowledged these results. IMPRESSION: 1. Small volume subarachnoid and possibly subdural hemorrhage along the superficial left temporal lobe. No significant mass effect. 2. Scalp and facial swelling without acute fracture. 3. Negative for cervical spine fracture. 4. 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 ENT referral is recommended if appropriate for comorbidities. Electronically Signed   By: Monte Fantasia M.D.   On: 03/20/2020 08:07   CT Angio Neck W and/or Wo Contrast  Result Date: 03/20/2020 CLINICAL DATA:  Head trauma with intracranial arterial injury suspected EXAM: CT ANGIOGRAPHY HEAD AND NECK TECHNIQUE: Multidetector CT imaging of the head and neck was performed using the standard protocol during bolus administration of intravenous contrast. Multiplanar CT image reconstructions and MIPs were obtained to evaluate the vascular anatomy. Carotid stenosis measurements (when applicable) are obtained utilizing NASCET criteria, using the distal internal carotid  diameter as the denominator. CONTRAST:  14mL OMNIPAQUE IOHEXOL 350 MG/ML SOLN COMPARISON:  Head CT from earlier today. FINDINGS: CTA NECK FINDINGS Aortic arch: Atheromatous calcification.  No aneurysm or dissection Right carotid system: Atheromatous wall thickening with calcification at the bifurcation. No stenosis or  ulceration. Left carotid system: Atheromatous wall thickening of the common carotid. No flow limiting stenosis or ulceration. Vertebral arteries: No proximal subclavian stenosis. There are codominant vertebral arteries that are smooth and widely patent to the dura. Skeleton: No acute finding. Other neck: Left-sided sinusitis with OMU obstructive pattern and cortical breakthrough at the posterior wall left maxillary sinus, described on preceding head CT. Upper chest: Moderate, layering right pleural effusion. Generous mediastinal lymph nodes, likely reactive. Review of the MIP images confirms the above findings CTA HEAD FINDINGS Anterior circulation: Heavily calcified carotid siphons with up to 50% stenosis on the right. No branch occlusion, beading, aneurysm, or evidence of vascular malformation. Broad appearance at the anterior communicating artery without discrete aneurysm. Essentially azygos A2 segment. Posterior circulation: Codominant vertebral arteries. Atheromatous plaque asymmetric to the left V4 segment. The vertebral and basilar arteries are smooth and widely patent. Fetal type left PCA. Negative for aneurysm or vascular malformation Venous sinuses: Unremarkable Anatomic variants: As above Review of the MIP images confirms the above findings IMPRESSION: 1. No arterial cause/injury seen underlying the traumatic pattern subarachnoid hemorrhage. 2. Cervical and intracranial carotid atherosclerosis with up to 50% stenosis at the right cavernous ICA. 3. Layering right pleural effusion. 4. Complicated left sinusitis as previously described. Electronically Signed   By: Monte Fantasia M.D.   On: 03/20/2020 10:13   CT Cervical Spine Wo Contrast  Result Date: 03/20/2020 CLINICAL DATA:  Fall with facial trauma EXAM: CT HEAD WITHOUT CONTRAST CT MAXILLOFACIAL WITHOUT CONTRAST CT CERVICAL SPINE WITHOUT CONTRAST TECHNIQUE: Multidetector CT imaging of the head, cervical spine, and maxillofacial structures were  performed using the standard protocol without intravenous contrast. Multiplanar CT image reconstructions of the cervical spine and maxillofacial structures were also generated. COMPARISON:  01/25/2020 FINDINGS: CT HEAD FINDINGS Brain: High-density/acute hemorrhage in the subarachnoid and possibly subdural spaces over the lateral left temporal lobe, in total measuring up to 3 mm in thickness. No associated mass effect. No visible parenchymal hemorrhage or swelling. Brain atrophy and chronic small vessel ischemia. No evidence of acute infarct. 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 Vascular: No hyperdense vessel or unexpected calcification. Skull: Posterior scalp swelling without calvarial fracture. CT MAXILLOFACIAL FINDINGS Osseous: No acute fracture or mandibular dislocation. Orbits: Swelling superficial to the right orbit. No visible globe or postseptal injury. Bilateral cataract resection. 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 Sinuses: Chronic sinusitis with opacified left maxillary, anterior ethmoid, and frontal sinuses. Left maxillary wall is sclerotic and thickened with a broad dehiscence along the posterolateral wall that is traversed by partially calcified material. No sinus mass was seen on a April 07, 2009 brain MRI, but dehiscence had not been noted at that time either. Soft tissues: Hazy retro antral fat on the left, stable where covered on prior head CT 01/25/2020. CT CERVICAL SPINE FINDINGS Alignment: No traumatic malalignment. Skull base and vertebrae: Negative for cervical spine fracture. T1 and T2 superior endplate fractures with trabecular impaction, nonacute in seen on a 2021 chest CT. Soft tissues and spinal canal: No prevertebral fluid or swelling. No visible canal hematoma. Mildly enlarged lymph nodes in the upper right jugular chain, posterior to the internal jugular vein, unchanged from November 2021  and thus presumably reactive  Disc levels: Multilevel degenerative facet spurring asymmetric to the right. Ordinary mid and lower cervical disc degeneration. Upper chest: Layering right pleural effusion, small where covered. Critical Value/emergent results were called by telephone at the time of interpretation on 03/20/2020 at 8:00 am to provider Lavonia Drafts , who verbally acknowledged these results. IMPRESSION: 1. Small volume subarachnoid and possibly subdural hemorrhage along the superficial left temporal lobe. No significant mass effect. 2. Scalp and facial swelling without acute fracture. 3. Negative for cervical spine fracture. 4. 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 ENT referral is recommended if appropriate for comorbidities. Electronically Signed   By: Monte Fantasia M.D.   On: 03/20/2020 08:07   CT Maxillofacial Wo Contrast  Result Date: 03/20/2020 CLINICAL DATA:  Fall with facial trauma EXAM: CT HEAD WITHOUT CONTRAST CT MAXILLOFACIAL WITHOUT CONTRAST CT CERVICAL SPINE WITHOUT CONTRAST TECHNIQUE: Multidetector CT imaging of the head, cervical spine, and maxillofacial structures were performed using the standard protocol without intravenous contrast. Multiplanar CT image reconstructions of the cervical spine and maxillofacial structures were also generated. COMPARISON:  01/25/2020 FINDINGS: CT HEAD FINDINGS Brain: High-density/acute hemorrhage in the subarachnoid and possibly subdural spaces over the lateral left temporal lobe, in total measuring up to 3 mm in thickness. No associated mass effect. No visible parenchymal hemorrhage or swelling. Brain atrophy and chronic small vessel ischemia. No evidence of acute infarct. 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 Vascular: No hyperdense vessel or unexpected calcification. Skull: Posterior scalp  swelling without calvarial fracture. CT MAXILLOFACIAL FINDINGS Osseous: No acute fracture or mandibular dislocation. Orbits: Swelling superficial to the right orbit. No visible globe or postseptal injury. Bilateral cataract resection. 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 Sinuses: Chronic sinusitis with opacified left maxillary, anterior ethmoid, and frontal sinuses. Left maxillary wall is sclerotic and thickened with a broad dehiscence along the posterolateral wall that is traversed by partially calcified material. No sinus mass was seen on a April 07, 2009 brain MRI, but dehiscence had not been noted at that time either. Soft tissues: Hazy retro antral fat on the left, stable where covered on prior head CT 01/25/2020. CT CERVICAL SPINE FINDINGS Alignment: No traumatic malalignment. Skull base and vertebrae: Negative for cervical spine fracture. T1 and T2 superior endplate fractures with trabecular impaction, nonacute in seen on a 2021 chest CT. Soft tissues and spinal canal: No prevertebral fluid or swelling. No visible canal hematoma. Mildly enlarged lymph nodes in the upper right jugular chain, posterior to the internal jugular vein, unchanged from November 2021 and thus presumably reactive Disc levels: Multilevel degenerative facet spurring asymmetric to the right. Ordinary mid and lower cervical disc degeneration. Upper chest: Layering right pleural effusion, small where covered. Critical Value/emergent results were called by telephone at the time of interpretation on 03/20/2020 at 8:00 am to provider Lavonia Drafts , who verbally acknowledged these results. IMPRESSION: 1. Small volume subarachnoid and possibly subdural hemorrhage along the superficial left temporal lobe. No significant mass effect. 2. Scalp and facial swelling without acute fracture. 3. Negative for cervical spine fracture. 4. 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 Complicated disease  at the left maxillary sinus with chronic defect in the posterior wall that does not appear progressed from November 2021 but is new from January 2021. ENT referral is recommended  if appropriate for comorbidities. Electronically Signed   By: Monte Fantasia M.D.   On: 03/20/2020 08:07     Medications:    . amoxicillin-clavulanate  1 tablet Oral Q24H  . atorvastatin  10 mg Oral Daily  . b complex-vitamin c-folic acid  1 tablet Oral Daily  . calcium acetate  1,334 mg Oral TID WC  . epoetin (EPOGEN/PROCRIT) injection  4,000 Units Intravenous Q M,W,F-HD  . gabapentin  100 mg Oral BID  . insulin aspart  0-6 Units Subcutaneous TID WC  . levETIRAcetam  500 mg Oral BID  . levothyroxine  200 mcg Oral QAC breakfast  . [START ON 03/22/2020] lidocaine-prilocaine  1 application Topical Q T,Th,Sa-HD  . lipase/protease/amylase  24,000 Units Oral TID WC  . metoprolol succinate  50 mg Oral Daily  . pantoprazole  40 mg Oral Daily  . sodium chloride flush  3 mL Intravenous Q12H  . sodium chloride flush  3 mL Intravenous Q12H  . traZODone  100 mg Oral QHS   acetaminophen **OR** acetaminophen  Assessment/ Plan:  Kyle [name] is a 70 y.o.  male  past medical history of CVA with residual left-sided weakness, ESRD on HD MWF, seizure disorder, diabetes mellitus, hypertension, anemia of chronic kidney disease, secondary hyperparathyroidism who was admitted post fall.   1.  ESRD on HD MWF.   - Seen and examined on hemodialysis treatment  2.  Anemia of chronic kidney disease.   - ESA  3.  Secondary hyperparathyroidism.  -  Continue calcium acetate 2 tablets p.o. 3 times daily with meals.  4.  Hypertension.   - metoprolol   LOS: 0 Carletha Dawn 1/24/20221:24 PM

## 2020-03-21 NOTE — ED Notes (Signed)
Pt resting at this time. Oriented to person. Refusing to answer date of birth. States "I already told you"

## 2020-03-21 NOTE — Progress Notes (Addendum)
Unable to obtain admission documentation at this time. Patient disoriented x4 and unable to answer questions. Some documentation completed using information from patient's recent admission.

## 2020-03-21 NOTE — ED Notes (Signed)
This RN updated dialysis RN Lorriane Shire regarding pt

## 2020-03-22 DIAGNOSIS — E890 Postprocedural hypothyroidism: Secondary | ICD-10-CM | POA: Diagnosis present

## 2020-03-22 DIAGNOSIS — Z8719 Personal history of other diseases of the digestive system: Secondary | ICD-10-CM | POA: Diagnosis not present

## 2020-03-22 DIAGNOSIS — G40909 Epilepsy, unspecified, not intractable, without status epilepticus: Secondary | ICD-10-CM | POA: Diagnosis present

## 2020-03-22 DIAGNOSIS — S066X9A Traumatic subarachnoid hemorrhage with loss of consciousness of unspecified duration, initial encounter: Secondary | ICD-10-CM | POA: Diagnosis present

## 2020-03-22 DIAGNOSIS — W19XXXA Unspecified fall, initial encounter: Secondary | ICD-10-CM | POA: Diagnosis present

## 2020-03-22 DIAGNOSIS — I132 Hypertensive heart and chronic kidney disease with heart failure and with stage 5 chronic kidney disease, or end stage renal disease: Secondary | ICD-10-CM | POA: Diagnosis present

## 2020-03-22 DIAGNOSIS — N186 End stage renal disease: Secondary | ICD-10-CM | POA: Diagnosis present

## 2020-03-22 DIAGNOSIS — I5032 Chronic diastolic (congestive) heart failure: Secondary | ICD-10-CM | POA: Diagnosis present

## 2020-03-22 DIAGNOSIS — Z992 Dependence on renal dialysis: Secondary | ICD-10-CM | POA: Diagnosis not present

## 2020-03-22 DIAGNOSIS — R296 Repeated falls: Secondary | ICD-10-CM | POA: Diagnosis present

## 2020-03-22 DIAGNOSIS — N2581 Secondary hyperparathyroidism of renal origin: Secondary | ICD-10-CM | POA: Diagnosis present

## 2020-03-22 DIAGNOSIS — E1151 Type 2 diabetes mellitus with diabetic peripheral angiopathy without gangrene: Secondary | ICD-10-CM | POA: Diagnosis present

## 2020-03-22 DIAGNOSIS — Y92230 Patient room in hospital as the place of occurrence of the external cause: Secondary | ICD-10-CM | POA: Diagnosis not present

## 2020-03-22 DIAGNOSIS — I69354 Hemiplegia and hemiparesis following cerebral infarction affecting left non-dominant side: Secondary | ICD-10-CM | POA: Diagnosis not present

## 2020-03-22 DIAGNOSIS — I609 Nontraumatic subarachnoid hemorrhage, unspecified: Secondary | ICD-10-CM | POA: Diagnosis not present

## 2020-03-22 DIAGNOSIS — R4701 Aphasia: Secondary | ICD-10-CM | POA: Diagnosis present

## 2020-03-22 DIAGNOSIS — Z515 Encounter for palliative care: Secondary | ICD-10-CM | POA: Diagnosis not present

## 2020-03-22 DIAGNOSIS — Z66 Do not resuscitate: Secondary | ICD-10-CM | POA: Diagnosis present

## 2020-03-22 DIAGNOSIS — Y92009 Unspecified place in unspecified non-institutional (private) residence as the place of occurrence of the external cause: Secondary | ICD-10-CM | POA: Diagnosis not present

## 2020-03-22 DIAGNOSIS — Z7189 Other specified counseling: Secondary | ICD-10-CM | POA: Diagnosis not present

## 2020-03-22 DIAGNOSIS — Z20822 Contact with and (suspected) exposure to covid-19: Secondary | ICD-10-CM | POA: Diagnosis present

## 2020-03-22 DIAGNOSIS — J42 Unspecified chronic bronchitis: Secondary | ICD-10-CM | POA: Diagnosis not present

## 2020-03-22 DIAGNOSIS — J449 Chronic obstructive pulmonary disease, unspecified: Secondary | ICD-10-CM | POA: Diagnosis present

## 2020-03-22 DIAGNOSIS — E1122 Type 2 diabetes mellitus with diabetic chronic kidney disease: Secondary | ICD-10-CM | POA: Diagnosis present

## 2020-03-22 DIAGNOSIS — N400 Enlarged prostate without lower urinary tract symptoms: Secondary | ICD-10-CM | POA: Diagnosis present

## 2020-03-22 DIAGNOSIS — G9341 Metabolic encephalopathy: Secondary | ICD-10-CM | POA: Diagnosis present

## 2020-03-22 DIAGNOSIS — G4733 Obstructive sleep apnea (adult) (pediatric): Secondary | ICD-10-CM | POA: Diagnosis present

## 2020-03-22 DIAGNOSIS — R55 Syncope and collapse: Secondary | ICD-10-CM | POA: Diagnosis present

## 2020-03-22 DIAGNOSIS — F32A Depression, unspecified: Secondary | ICD-10-CM | POA: Diagnosis present

## 2020-03-22 DIAGNOSIS — S062X0D Diffuse traumatic brain injury without loss of consciousness, subsequent encounter: Secondary | ICD-10-CM | POA: Diagnosis not present

## 2020-03-22 DIAGNOSIS — E785 Hyperlipidemia, unspecified: Secondary | ICD-10-CM | POA: Diagnosis present

## 2020-03-22 DIAGNOSIS — E039 Hypothyroidism, unspecified: Secondary | ICD-10-CM | POA: Diagnosis not present

## 2020-03-22 DIAGNOSIS — J329 Chronic sinusitis, unspecified: Secondary | ICD-10-CM | POA: Diagnosis not present

## 2020-03-22 LAB — CBC
HCT: 32.3 % — ABNORMAL LOW (ref 39.0–52.0)
Hemoglobin: 10.4 g/dL — ABNORMAL LOW (ref 13.0–17.0)
MCH: 31.5 pg (ref 26.0–34.0)
MCHC: 32.2 g/dL (ref 30.0–36.0)
MCV: 97.9 fL (ref 80.0–100.0)
Platelets: 149 10*3/uL — ABNORMAL LOW (ref 150–400)
RBC: 3.3 MIL/uL — ABNORMAL LOW (ref 4.22–5.81)
RDW: 14.3 % (ref 11.5–15.5)
WBC: 6.6 10*3/uL (ref 4.0–10.5)
nRBC: 0 % (ref 0.0–0.2)

## 2020-03-22 LAB — BASIC METABOLIC PANEL
Anion gap: 18 — ABNORMAL HIGH (ref 5–15)
BUN: 29 mg/dL — ABNORMAL HIGH (ref 8–23)
CO2: 23 mmol/L (ref 22–32)
Calcium: 8.3 mg/dL — ABNORMAL LOW (ref 8.9–10.3)
Chloride: 97 mmol/L — ABNORMAL LOW (ref 98–111)
Creatinine, Ser: 6.22 mg/dL — ABNORMAL HIGH (ref 0.61–1.24)
GFR, Estimated: 9 mL/min — ABNORMAL LOW (ref >60)
Glucose, Bld: 115 mg/dL — ABNORMAL HIGH (ref 70–99)
Potassium: 4.6 mmol/L (ref 3.5–5.1)
Sodium: 138 mmol/L (ref 135–145)

## 2020-03-22 LAB — ECHOCARDIOGRAM COMPLETE
AR max vel: 1.73 cm2
AV Peak grad: 4.6 mmHg
Ao pk vel: 1.07 m/s
Area-P 1/2: 4.29 cm2
Calc EF: 35.3 %
Height: 72 in
S' Lateral: 4.42 cm
Single Plane A2C EF: 35.4 %
Single Plane A4C EF: 38.2 %
Weight: 2816 oz

## 2020-03-22 LAB — GLUCOSE, CAPILLARY
Glucose-Capillary: 106 mg/dL — ABNORMAL HIGH (ref 70–99)
Glucose-Capillary: 90 mg/dL (ref 70–99)
Glucose-Capillary: 150 mg/dL — ABNORMAL HIGH (ref 70–99)
Glucose-Capillary: 154 mg/dL — ABNORMAL HIGH (ref 70–99)
Glucose-Capillary: 179 mg/dL — ABNORMAL HIGH (ref 70–99)

## 2020-03-22 LAB — PHOSPHORUS: Phosphorus: 5 mg/dL — ABNORMAL HIGH (ref 2.5–4.6)

## 2020-03-22 LAB — MAGNESIUM: Magnesium: 1.5 mg/dL — ABNORMAL LOW (ref 1.7–2.4)

## 2020-03-22 MED ORDER — MAGNESIUM SULFATE 2 GM/50ML IV SOLN
2.0000 g | Freq: Once | INTRAVENOUS | Status: AC
  Administered 2020-03-22: 2 g via INTRAVENOUS
  Filled 2020-03-22: qty 50

## 2020-03-22 NOTE — Progress Notes (Signed)
OT Cancellation Note  Patient Details Name: Kyle [name] MRN: [id number] DOB: 11-15-1950   Cancelled Treatment:    Reason Eval/Treat Not Completed: Other (comment). Upon attempt, pt with RN. RN reporting medication administration and assessment. Pt unavailable at this time for OT evaluation. Will re-attempt at later date/time as pt is available and medically appropriate.   Jeni Salles, MPH, MS, OTR/L ascom [phone] 03/22/20, 9:59 AM

## 2020-03-22 NOTE — Progress Notes (Signed)
[facility]  ROUNDING NOTE   Subjective:     Hemodialysis treatment yesterday. Tolerated treatment well. UF of 545mL  Confused this morning  Objective:  Vital signs in last 24 hours:  Temp:  [97.9 F (36.6 C)-99 F (37.2 C)] 98.2 F (36.8 C) (01/25 0837) Pulse Rate:  [75-88] 76 (01/25 0837) Resp:  [17-19] 19 (01/25 0837) BP: (168-188)/(73-94) 188/90 (01/25 0837) SpO2:  [98 %-100 %] 100 % (01/25 0837) Weight:  [80.1 kg-80.7 kg] 80.7 kg (01/25 0100)  Weight change: 0.227 kg Filed Weights   03/20/20 0705 03/21/20 1728 03/22/20 0100  Weight: 79.8 kg 80.1 kg 80.7 kg    Intake/Output: I/O last 3 completed shifts: In: -  Out: 500 [Other:500]   Intake/Output this shift:  No intake/output data recorded.  Physical Exam: General: NAD  Head: Normocephalic, atraumatic. Moist oral mucosal membranes  Eyes: + large ecchymosis around right eye   Neck: Supple, trachea midline  Lungs:  Clear to auscultation  Heart: Regular rate and rhythm  Abdomen:  Soft, nontender,   Extremities:  No peripheral edema.  Neurologic: Nonfocal, moving all four extremities  Skin: abrasions on his right arm   Access: Left upper extremity AVF, +thrill      Basic Metabolic Panel: Recent Labs  Lab 03/16/20 0520 03/16/20 1202 03/16/20 2117 03/17/20 0340 03/20/20 0717 03/21/20 0340 03/22/20 0631  NA 137  --   --  137 138 137 138  K 4.6 4.8  --  4.0 4.3 4.6 4.6  CL 97*  --   --  96* 98 96* 97*  CO2 22  --   --  28 26 23 23   GLUCOSE 105*  --   --  123* 209* 146* 115*  BUN 53*  --   --  32* 30* 37* 29*  CREATININE 9.06*  --   --  6.20* 6.78* 7.74* 6.22*  CALCIUM 6.7*  --   --  7.8* 7.0* 7.3* 8.3*  MG  --   --   --   --   --   --  1.5*  PHOS  --   --  4.8*  --   --   --  5.0*    Liver Function Tests: Recent Labs  Lab 03/17/20 0340  AST 35  ALT 30  ALKPHOS 110  BILITOT 1.0  PROT 6.8  ALBUMIN 3.4*   No results for input(s): LIPASE, AMYLASE in the last 168 hours. No  results for input(s): AMMONIA in the last 168 hours.  CBC: Recent Labs  Lab 03/15/20 1653 03/16/20 0520 03/17/20 0340 03/20/20 0717 03/21/20 0340 03/22/20 0631  WBC 7.2 5.7 4.8 5.4 5.8 6.6  NEUTROABS 5.3  --   --   --   --   --   HGB 10.8* 10.1* 11.0* 10.8* 10.0* 10.4*  HCT 35.5* 31.3* 34.7* 33.4* 31.7* 32.3*  MCV 101.1* 97.5 98.0 99.4 99.1 97.9  PLT 170 144* 151 140* 142* 149*    Cardiac Enzymes: Recent Labs  Lab 03/17/20 0340  CKTOTAL 95    BNP: Invalid input(s): POCBNP  CBG: Recent Labs  Lab 03/21/20 1613 03/21/20 1732 03/21/20 2039 03/22/20 0359 03/22/20 0854  GLUCAP 108* 109* 117* 106* 70    Microbiology: Results for orders placed or performed during the hospital encounter of 03/20/20  SARS Coronavirus 2 by RT PCR (hospital order, performed in [facility] hospital lab) Nasopharyngeal Nasopharyngeal Swab     Status: None   Collection Time: 03/20/20  9:04 AM  Specimen: Nasopharyngeal Swab  Result Value Ref Range Status   SARS Coronavirus 2 NEGATIVE NEGATIVE Final    Comment: (NOTE) SARS-CoV-2 target nucleic acids are NOT DETECTED.  The SARS-CoV-2 RNA is generally detectable in upper and lower respiratory specimens during the acute phase of infection. The lowest concentration of SARS-CoV-2 viral copies this assay can detect is 250 copies / mL. A negative result does not preclude SARS-CoV-2 infection and should not be used as the sole basis for treatment or other patient management decisions.  A negative result may occur with improper specimen collection / handling, submission of specimen other than nasopharyngeal swab, presence of viral mutation(s) within the areas targeted by this assay, and inadequate number of viral copies (<250 copies / mL). A negative result must be combined with clinical observations, patient history, and epidemiological information.  Fact Sheet for Patients:   StrictlyIdeas.no  Fact Sheet for  Healthcare Providers: BankingDealers.co.za  This test is not yet approved or  cleared by the [place] FDA and has been authorized for detection and/or diagnosis of SARS-CoV-2 by FDA under an Emergency Use Authorization (EUA).  This EUA will remain in effect (meaning this test can be used) for the duration of the COVID-19 declaration under Section 564(b)(1) of the Act, 21 U.S.C. section 360bbb-3(b)(1), unless the authorization is terminated or revoked sooner.  Performed at [facility], [address] [place], [place] [address]     Coagulation Studies: Recent Labs    03/20/20 0904  LABPROT 13.6  INR 1.1    Urinalysis: No results for input(s): COLORURINE, LABSPEC, PHURINE, GLUCOSEU, HGBUR, BILIRUBINUR, KETONESUR, PROTEINUR, UROBILINOGEN, NITRITE, LEUKOCYTESUR in the last 72 hours.  Invalid input(s): APPERANCEUR    Imaging: CT Angio Head W or Wo Contrast  Result Date: 03/20/2020 CLINICAL DATA:  Head trauma with intracranial arterial injury suspected EXAM: CT ANGIOGRAPHY HEAD AND NECK TECHNIQUE: Multidetector CT imaging of the head and neck was performed using the standard protocol during bolus administration of intravenous contrast. Multiplanar CT image reconstructions and MIPs were obtained to evaluate the vascular anatomy. Carotid stenosis measurements (when applicable) are obtained utilizing NASCET criteria, using the distal internal carotid diameter as the denominator. CONTRAST:  95mL OMNIPAQUE IOHEXOL 350 MG/ML SOLN COMPARISON:  Head CT from earlier today. FINDINGS: CTA NECK FINDINGS Aortic arch: Atheromatous calcification.  No aneurysm or dissection Right carotid system: Atheromatous wall thickening with calcification at the bifurcation. No stenosis or ulceration. Left carotid system: Atheromatous wall thickening of the common carotid. No flow limiting stenosis or ulceration. Vertebral arteries: No proximal subclavian stenosis. There are  codominant vertebral arteries that are smooth and widely patent to the dura. Skeleton: No acute finding. Other neck: Left-sided sinusitis with OMU obstructive pattern and cortical breakthrough at the posterior wall left maxillary sinus, described on preceding head CT. Upper chest: Moderate, layering right pleural effusion. Generous mediastinal lymph nodes, likely reactive. Review of the MIP images confirms the above findings CTA HEAD FINDINGS Anterior circulation: Heavily calcified carotid siphons with up to 50% stenosis on the right. No branch occlusion, beading, aneurysm, or evidence of vascular malformation. Broad appearance at the anterior communicating artery without discrete aneurysm. Essentially azygos A2 segment. Posterior circulation: Codominant vertebral arteries. Atheromatous plaque asymmetric to the left V4 segment. The vertebral and basilar arteries are smooth and widely patent. Fetal type left PCA. Negative for aneurysm or vascular malformation Venous sinuses: Unremarkable Anatomic variants: As above Review of the MIP images confirms the above findings IMPRESSION: 1. No arterial cause/injury seen underlying the  traumatic pattern subarachnoid hemorrhage. 2. Cervical and intracranial carotid atherosclerosis with up to 50% stenosis at the right cavernous ICA. 3. Layering right pleural effusion. 4. Complicated left sinusitis as previously described. Electronically Signed   By: Monte Fantasia M.D.   On: 03/20/2020 10:13   DG Chest 2 View  Result Date: 03/20/2020 CLINICAL DATA:  Assess for pleural effusion. EXAM: CHEST - 2 VIEW COMPARISON:  March 15, 2020 FINDINGS: The mediastinal contour and cardiac silhouette are stable. Heart size is enlarged. Mild increased pulmonary interstitium is identified bilaterally unchanged. There is minimal right pleural effusion. There is no left pleural effusion. The bony structures are stable. IMPRESSION: Minimal right pleural effusion. Electronically Signed   By:  Abelardo Diesel M.D.   On: 03/20/2020 11:16   CT HEAD WO CONTRAST  Result Date: 03/20/2020 CLINICAL DATA:  Initial evaluation for acute mental status change. Worsening. EXAM: CT HEAD WITHOUT CONTRAST TECHNIQUE: Contiguous axial images were obtained from the base of the skull through the vertex without intravenous contrast. COMPARISON:  Prior head CT from earlier the same day. FINDINGS: Brain: Posttraumatic subarachnoid hemorrhage involving the peripheral left frontotemporal convexity again seen, not significantly changed in overall appearance and volume from previous. Suspected superimposed hemorrhagic contusion also similar. Overlying subdural hematoma also not significantly changed, measuring up to 5-6 mm in maximal thickness. Trace 2 mm left-to-right shift, unchanged. No hydrocephalus or ventricular trapping. Basilar cisterns remain patent. No new intracranial hemorrhage. No acute large vessel territory infarct. Underlying atrophy with chronic small vessel ischemic disease again noted. No appreciable mass lesion. Vascular: No asymmetric hyperdense vessel. Calcified atherosclerosis present at the skull base. Skull: Evolving right frontoparietal scalp contusion. Calvarium intact. Sinuses/Orbits: Globes and orbital soft tissues demonstrate no acute finding. Chronic left frontoethmoidal and maxillary sinusitis again noted. Mastoids remain clear. Other: None. IMPRESSION: 1. No significant interval change in left frontotemporal convexity subarachnoid hemorrhage with probable superimposed hemorrhagic contusion. Overlying extra-axial hemorrhage also not significantly changed, likely subdural. Trace 2 mm left-to-right shift, unchanged. No hydrocephalus or ventricular trapping. 2. No other new acute intracranial abnormality. 3. Evolving right frontoparietal scalp contusion. No calvarial fracture. 4. Chronic left frontoethmoidal and maxillary sinusitis. Electronically Signed   By: Jeannine Boga M.D.   On:  03/20/2020 20:20   CT Head Wo Contrast  Result Date: 03/20/2020 CLINICAL DATA:  Follow-up of left temporal lobe subarachnoid and possible subdural hemorrhage. EXAM: CT HEAD WITHOUT CONTRAST TECHNIQUE: Contiguous axial images were obtained from the base of the skull through the vertex without intravenous contrast. COMPARISON:  Earlier today at 7:22 a.m. FINDINGS: 2:32 p.m. Brain: Cerebral atrophy. Mild increase in small volume subarachnoid hemorrhage adjacent the left temporal and frontal lobes, including on 23/2. Small volume subdural hemorrhage including at 4 mm on 14/2 is increased, especially superiorly. Example adjacent the left frontal lobe near the vertex on coronal image 24/4 at 3 mm. Adjacent the left parietal lobe at up to 5 mm on coronal image 43, new. No significant mass effect or midline shift. No complicating infarct. No hydrocephalus or intraventricular hemorrhage. Mild low density in the periventricular white matter likely related to small vessel disease. Vascular: Intracranial atherosclerosis. Skull: Posterior scalp soft tissue swelling is greater right than left and relatively similar. No skull fracture. Sinuses/Orbits: Normal imaged portions of the orbits and globes. Left sided sinus opacification was detailed on CT of earlier today. Other: None. IMPRESSION: 1. Mild increase in subarachnoid hemorrhage adjacent the left temporal and frontal lobes. 2. Small volume extra-axial hematoma, favoring subdural, mildly  increased especially superiorly. 3.  Cerebral atrophy and small vessel ischemic change. Electronically Signed   By: Abigail Miyamoto M.D.   On: 03/20/2020 15:02   CT Angio Neck W and/or Wo Contrast  Result Date: 03/20/2020 CLINICAL DATA:  Head trauma with intracranial arterial injury suspected EXAM: CT ANGIOGRAPHY HEAD AND NECK TECHNIQUE: Multidetector CT imaging of the head and neck was performed using the standard protocol during bolus administration of intravenous contrast. Multiplanar  CT image reconstructions and MIPs were obtained to evaluate the vascular anatomy. Carotid stenosis measurements (when applicable) are obtained utilizing NASCET criteria, using the distal internal carotid diameter as the denominator. CONTRAST:  39mL OMNIPAQUE IOHEXOL 350 MG/ML SOLN COMPARISON:  Head CT from earlier today. FINDINGS: CTA NECK FINDINGS Aortic arch: Atheromatous calcification.  No aneurysm or dissection Right carotid system: Atheromatous wall thickening with calcification at the bifurcation. No stenosis or ulceration. Left carotid system: Atheromatous wall thickening of the common carotid. No flow limiting stenosis or ulceration. Vertebral arteries: No proximal subclavian stenosis. There are codominant vertebral arteries that are smooth and widely patent to the dura. Skeleton: No acute finding. Other neck: Left-sided sinusitis with OMU obstructive pattern and cortical breakthrough at the posterior wall left maxillary sinus, described on preceding head CT. Upper chest: Moderate, layering right pleural effusion. Generous mediastinal lymph nodes, likely reactive. Review of the MIP images confirms the above findings CTA HEAD FINDINGS Anterior circulation: Heavily calcified carotid siphons with up to 50% stenosis on the right. No branch occlusion, beading, aneurysm, or evidence of vascular malformation. Broad appearance at the anterior communicating artery without discrete aneurysm. Essentially azygos A2 segment. Posterior circulation: Codominant vertebral arteries. Atheromatous plaque asymmetric to the left V4 segment. The vertebral and basilar arteries are smooth and widely patent. Fetal type left PCA. Negative for aneurysm or vascular malformation Venous sinuses: Unremarkable Anatomic variants: As above Review of the MIP images confirms the above findings IMPRESSION: 1. No arterial cause/injury seen underlying the traumatic pattern subarachnoid hemorrhage. 2. Cervical and intracranial carotid  atherosclerosis with up to 50% stenosis at the right cavernous ICA. 3. Layering right pleural effusion. 4. Complicated left sinusitis as previously described. Electronically Signed   By: Monte Fantasia M.D.   On: 03/20/2020 10:13   ECHOCARDIOGRAM COMPLETE  Result Date: 03/22/2020    ECHOCARDIOGRAM REPORT   Patient Name:   Kyle [name] Date of Exam: 03/21/2020 Medical Rec #:  [id number]          Height:       72.0 in Accession #:    [id number]         Weight:       176.0 lb Date of Birth:  1950/05/31          BSA:          2.018 m Patient Age:    70 years           BP:           181/83 mmHg Patient Gender: M                  HR:           81 bpm. Exam Location:  [facility] Procedure: 2D Echo, Cardiac Doppler and Color Doppler Indications:     Syncope 780.2 / R55  History:         Patient has prior history of Echocardiogram examinations.                  Stroke; Risk  Factors:Hypertension.  Sonographer:     Alyse Low Roar Referring Phys:  [place] Diagnosing Phys: Bartholome Bill MD IMPRESSIONS  1. Left ventricular ejection fraction, by estimation, is 35 to 40%. The left ventricle has moderately decreased function. The left ventricle demonstrates global hypokinesis. The left ventricular internal cavity size was mildly dilated. There is mild left ventricular hypertrophy. Left ventricular diastolic parameters were normal.  2. Right ventricular systolic function is mildly reduced. The right ventricular size is moderately enlarged.  3. Left atrial size was mild to moderately dilated.  4. Right atrial size was mildly dilated.  5. The mitral valve is grossly normal. Mild mitral valve regurgitation.  6. The aortic valve is grossly normal. Aortic valve regurgitation is not visualized. FINDINGS  Left Ventricle: Left ventricular ejection fraction, by estimation, is 35 to 40%. The left ventricle has moderately decreased function. The left ventricle demonstrates global hypokinesis. The left ventricular internal cavity  size was mildly dilated. There is mild left ventricular hypertrophy. Left ventricular diastolic parameters were normal. Right Ventricle: The right ventricular size is moderately enlarged. No increase in right ventricular wall thickness. Right ventricular systolic function is mildly reduced. Left Atrium: Left atrial size was mild to moderately dilated. Right Atrium: Right atrial size was mildly dilated. Pericardium: There is no evidence of pericardial effusion. Mitral Valve: The mitral valve is grossly normal. Mild mitral valve regurgitation. Tricuspid Valve: The tricuspid valve is grossly normal. Tricuspid valve regurgitation is mild. Aortic Valve: The aortic valve is grossly normal. Aortic valve regurgitation is not visualized. Aortic valve peak gradient measures 4.6 mmHg. Pulmonic Valve: The pulmonic valve was not well visualized. Pulmonic valve regurgitation is trivial. Aorta: The aortic root is normal in size and structure. IAS/Shunts: No atrial level shunt detected by color flow Doppler.  LEFT VENTRICLE PLAX 2D LVIDd:         5.37 cm      Diastology LVIDs:         4.42 cm      LV e' medial:   4.35 cm/s LV PW:         1.19 cm      LV E/e' medial: 23.0 LV IVS:        1.14 cm LVOT diam:     1.90 cm LVOT Area:     2.84 cm  LV Volumes (MOD) LV vol d, MOD A2C: 151.0 ml LV vol d, MOD A4C: 132.0 ml LV vol s, MOD A2C: 97.5 ml LV vol s, MOD A4C: 81.6 ml LV SV MOD A2C:     53.5 ml LV SV MOD A4C:     132.0 ml LV SV MOD BP:      50.5 ml RIGHT VENTRICLE RV Mid diam:    4.75 cm RV S prime:     7.07 cm/s TAPSE (M-mode): 1.0 cm LEFT ATRIUM              Index       RIGHT ATRIUM           Index LA diam:        4.80 cm  2.38 cm/m  RA Area:     31.10 cm LA Vol (A2C):   116.0 ml 57.49 ml/m RA Volume:   114.00 ml 56.49 ml/m LA Vol (A4C):   107.0 ml 53.03 ml/m LA Biplane Vol: 112.0 ml 55.50 ml/m  AORTIC VALVE                PULMONIC VALVE AV Area (Vmax): 1.73 cm  RVOT Peak grad: 1 mmHg AV Vmax:        107.00 cm/s AV Peak  Grad:   4.6 mmHg LVOT Vmax:      65.10 cm/s  AORTA Ao Root diam: 3.40 cm MITRAL VALVE                TRICUSPID VALVE MV Area (PHT): 4.29 cm     TR Peak grad:   37.2 mmHg MV Decel Time: 177 msec     TR Vmax:        305.00 cm/s MV E velocity: 100.00 cm/s                             SHUNTS                             Systemic Diam: 1.90 cm Bartholome Bill MD Electronically signed by Bartholome Bill MD Signature Date/Time: 03/22/2020/7:42:50 AM    Final      Medications:   . magnesium sulfate bolus IVPB     . amoxicillin-clavulanate  1 tablet Oral Q24H  . atorvastatin  10 mg Oral Daily  . b complex-vitamin c-folic acid  1 tablet Oral Daily  . calcium acetate  1,334 mg Oral TID WC  . epoetin (EPOGEN/PROCRIT) injection  4,000 Units Intravenous Q M,W,F-HD  . gabapentin  100 mg Oral BID  . insulin aspart  0-6 Units Subcutaneous TID WC  . levETIRAcetam  500 mg Oral BID  . levothyroxine  200 mcg Oral QAC breakfast  . lidocaine-prilocaine  1 application Topical Q T,Th,Sa-HD  . lipase/protease/amylase  24,000 Units Oral TID WC  . metoprolol succinate  50 mg Oral Daily  . pantoprazole  40 mg Oral Daily  . sodium chloride flush  3 mL Intravenous Q12H  . sodium chloride flush  3 mL Intravenous Q12H  . traZODone  100 mg Oral QHS   acetaminophen **OR** acetaminophen  Assessment/ Plan:  Kyle [name] is a 70 y.o.  male  past medical history of CVA with residual left-sided weakness, ESRD on HD MWF, seizure disorder, diabetes mellitus, hypertension, anemia of chronic kidney disease, secondary hyperparathyroidism who was admitted post fall.   [facility] MWF [address] Left AVF 79.5kg  1.  ESRD on HD MWF. Tolerated hemodialysis treatment yesterday.   2.  Anemia of chronic kidney disease: hemoglobin 10.4 - EPO with HD treatments  3.  Secondary hyperparathyroidism.  -  Continue calcium acetate with meals  4.  Hypertension.   - metoprolol   LOS: 0 Kyle [name] 1/25/20229:14 AM

## 2020-03-22 NOTE — Consult Note (Signed)
[facility] Cardiology Consultation Note  Patient ID: Kyle [name], MRN: [id number], DOB/AGE: [birth year] 70 y.o. Admit date: 03/20/2020   Date of Consult: 03/22/2020 Primary Physician: Idelle Crouch, MD  1/24.  Overall there has been no evidence of change in patient's condition at this time.  He still does have a significant hematoma of his right face.  The patient has not had any telemetry changes or rhythm disturbances consistent with his episode of syncope and/or fall.  He has had further evaluation from the peripheral vascular standpoint with no evidence of carotid atherosclerosis as his primary issue.  There is been no seizure by EEG with recent work-up.  There has been no evidence of congestive heart failure myocardial infarction or acute coronary syndrome.  We have discussed the possibility of a Linq device although I am not 100% sure he is understanding all and may have to have further evaluation by family members as well  1/25.  No significant change in condition today.  Patient's eye is less swollen.  No evidence of congestive heart failure type symptoms angina and/or acute coronary syndrome.  Telemetry shows normal sinus rhythm with no evidence of primary cause of fall and/or syncope.  Past Medical History:  Diagnosis Date  . Anemia   . Anxiety   . BPH (benign prostatic hyperplasia)   . Chronic kidney disease   . Colon polyps   . COPD (chronic obstructive pulmonary disease) ([place])   . Depression   . Diabetes mellitus without complication ([place])   . Diverticulosis of colon   . History of kidney stones   . Hyperlipidemia   . Hypertension   . Hypothyroidism   . Nephrolithiasis   . Seizures ([place])    2015 last seizure  . Stroke [facility])       Surgical History:  Past Surgical History:  Procedure Laterality Date  . AV FISTULA PLACEMENT Left 03/30/2019   Procedure: right brachiocephalic fistula creation;  Surgeon: Marty Heck, MD;  Location: [place];  Service:  Vascular;  Laterality: Left;  . BIOPSY  04/05/2019   Procedure: BIOPSY;  Surgeon: Wilford Corner, MD;  Location: [place];  Service: Endoscopy;;  . COLONOSCOPY    . COLONOSCOPY WITH PROPOFOL N/A 05/30/2015   Procedure: COLONOSCOPY WITH PROPOFOL;  Surgeon: Manya Silvas, MD;  Location: [facility] ENDOSCOPY;  Service: Endoscopy;  Laterality: N/A;  . COLONOSCOPY WITH PROPOFOL N/A 12/01/2018   Procedure: COLONOSCOPY WITH PROPOFOL;  Surgeon: Toledo, [name], MD;  Location: [facility] ENDOSCOPY;  Service: Gastroenterology;  Laterality: N/A;  . COLONOSCOPY WITH PROPOFOL N/A 04/09/2019   Procedure: COLONOSCOPY WITH PROPOFOL;  Surgeon: Carol Ada, MD;  Location: [place];  Service: Endoscopy;  Laterality: N/A;  . ENTEROSCOPY N/A 05/05/2019   Procedure: ENTEROSCOPY;  Surgeon: Ronnette Juniper, MD;  Location: [facility] ENDOSCOPY;  Service: Gastroenterology;  Laterality: N/A;  Push enteroscopy  . ENTEROSCOPY N/A 07/28/2019   Procedure: ENTEROSCOPY;  Surgeon: Virgel Manifold, MD;  Location: [facility] ENDOSCOPY;  Service: Endoscopy;  Laterality: N/A;  . ESOPHAGOGASTRODUODENOSCOPY N/A 03/17/2020   Procedure: ESOPHAGOGASTRODUODENOSCOPY (EGD);  Surgeon: Lesly Rubenstein, MD;  Location: [facility] ENDOSCOPY;  Service: Endoscopy;  Laterality: N/A;  . ESOPHAGOGASTRODUODENOSCOPY (EGD) WITH PROPOFOL N/A 04/05/2019   Procedure: ESOPHAGOGASTRODUODENOSCOPY (EGD) WITH PROPOFOL;  Surgeon: Wilford Corner, MD;  Location: [place];  Service: Endoscopy;  Laterality: N/A;  . GIVENS CAPSULE STUDY N/A 05/01/2019   Procedure: GIVENS CAPSULE STUDY;  Surgeon: Ronald Lobo, MD;  Location: [place];  Service: Endoscopy;  Laterality: N/A;  .  HOT HEMOSTASIS N/A 04/09/2019   Procedure: HOT HEMOSTASIS (ARGON PLASMA COAGULATION/BICAP);  Surgeon: Carol Ada, MD;  Location: [place];  Service: Endoscopy;  Laterality: N/A;  . HOT HEMOSTASIS N/A 05/05/2019   Procedure: HOT HEMOSTASIS (ARGON PLASMA COAGULATION/BICAP);  Surgeon: Ronnette Juniper, MD;   Location: [place];  Service: Gastroenterology;  Laterality: N/A;  . INSERTION OF DIALYSIS CATHETER Right 03/30/2019   Procedure: Ultrasound guided right internal jugular tunneled dialysis catheter placement;  Surgeon: Marty Heck, MD;  Location: [facility] OR;  Service: Vascular;  Laterality: Right;  . kidney stone    . REMOVAL OF A DIALYSIS CATHETER Right 06/26/2019   Procedure: REMOVAL OF A DIALYSIS CATHETER ( PD CATH REMOVAL AND I J CATH REMOVAL;  Surgeon: Katha Cabal, MD;  Location: [facility] ORS;  Service: Vascular;  Laterality: Right;  . THYROIDECTOMY    . VARICOCELE EXCISION    . VIDEO BRONCHOSCOPY Bilateral 05/25/2016   Procedure: VIDEO BRONCHOSCOPY WITHOUT FLUORO;  Surgeon: Juanito Doom, MD;  Location: [facility] ENDOSCOPY;  Service: Cardiopulmonary;  Laterality: Bilateral;     Home Meds: Prior to Admission medications   Medication Sig Start Date End Date Taking? Authorizing Provider  atorvastatin (LIPITOR) 10 MG tablet Take 10 mg by mouth daily. 01/08/20  Yes [provider]  B Complex-C-Folic Acid (NEPHRO-VITE PO) Take 1 tablet by mouth daily.   Yes [provider]  calcium acetate (PHOSLO) 667 MG capsule Take 2 capsules (1,334 mg total) by mouth 3 (three) times daily with meals. 03/20/19  Yes Wieting, Richard, MD  clopidogrel (PLAVIX) 75 MG tablet Take 75 mg by mouth daily.   Yes [provider]  escitalopram (LEXAPRO) 10 MG tablet Take 10 mg by mouth daily. 07/14/19  Yes [provider]  gabapentin (NEURONTIN) 100 MG capsule Take 1 capsule (100 mg total) by mouth 2 (two) times daily. 01/28/20  Yes Pokhrel, Laxman, MD  levETIRAcetam (KEPPRA) 500 MG tablet Take 500 mg by mouth 2 (two) times daily. 03/09/20  Yes [provider]  levothyroxine (SYNTHROID) 200 MCG tablet Take 200 mcg by mouth daily before breakfast.  02/25/19  Yes [provider]  lidocaine-prilocaine (EMLA) cream Apply 1 application topically Every  Tuesday,Thursday,and Saturday with dialysis.    Yes [provider]  metoprolol succinate (TOPROL-XL) 50 MG 24 hr tablet Take 50 mg by mouth daily. 02/22/20  Yes [provider]  Pancrelipase, Lip-Prot-Amyl, 24000-76000 units CPEP Take 1 capsule by mouth 3 (three) times daily with meals.    Yes [provider]  pantoprazole (PROTONIX) 40 MG tablet Take 40 mg by mouth daily.   Yes [provider]    Inpatient Medications:  . amoxicillin-clavulanate  1 tablet Oral Q24H  . atorvastatin  10 mg Oral Daily  . b complex-vitamin c-folic acid  1 tablet Oral Daily  . calcium acetate  1,334 mg Oral TID WC  . epoetin (EPOGEN/PROCRIT) injection  4,000 Units Intravenous Q M,W,F-HD  . gabapentin  100 mg Oral BID  . insulin aspart  0-6 Units Subcutaneous TID WC  . levETIRAcetam  500 mg Oral BID  . levothyroxine  200 mcg Oral QAC breakfast  . lidocaine-prilocaine  1 application Topical Q T,Th,Sa-HD  . lipase/protease/amylase  24,000 Units Oral TID WC  . metoprolol succinate  50 mg Oral Daily  . pantoprazole  40 mg Oral Daily  . sodium chloride flush  3 mL Intravenous Q12H  . sodium chloride flush  3 mL Intravenous Q12H  . traZODone  100 mg  Oral QHS     Allergies:  Allergies  Allergen Reactions  . Vimpat [Lacosamide] Other (See Comments)    Reports causes seizures    Social History   Socioeconomic History  . Marital status: Married    Spouse name: Not on file  . Number of children: Not on file  . Years of education: Not on file  . Highest education level: Not on file  Occupational History  . Not on file  Tobacco Use  . Smoking status: Light Tobacco Smoker    Packs/day: 0.25    Years: 40.00    Pack years: 10.00    Types: Cigarettes    Last attempt to quit: 05/25/2016    Years since quitting: 3.8  . Smokeless tobacco: Never Used  Vaping Use  . Vaping Use: Never used  Substance and Sexual Activity  . Alcohol use: Not Currently  . Drug use: No  .  Sexual activity: Not on file  Other Topics Concern  . Not on file  Social History Narrative  . Not on file   Social Determinants of Health   Financial Resource Strain: Not on file  Food Insecurity: Not on file  Transportation Needs: Not on file  Physical Activity: Not on file  Stress: Not on file  Social Connections: Not on file  Intimate Partner Violence: Not on file     Family History  Problem Relation Age of Onset  . Diabetes Mother   . Diabetes Maternal Grandmother   . Diabetes Maternal Grandfather   . Lung cancer Father   . Emphysema Paternal Grandfather      Review of Systems Positive for none Negative for: General:  chills, fever, night sweats or weight changes.  Cardiovascular: PND orthopnea syncope dizziness  Dermatological skin lesions rashes Respiratory: Cough congestion Urologic: Frequent urination urination at night and hematuria Abdominal: negative for nausea, vomiting, diarrhea, bright red blood per rectum, melena, or hematemesis Neurologic: negative for visual changes, and/or hearing changes  All other systems reviewed and are otherwise negative except as noted above.  Labs: No results for input(s): CKTOTAL, CKMB, TROPONINI in the last 72 hours. Lab Results  Component Value Date   WBC 6.6 03/22/2020   HGB 10.4 (L) 03/22/2020   HCT 32.3 (L) 03/22/2020   MCV 97.9 03/22/2020   PLT 149 (L) 03/22/2020    Recent Labs  Lab 03/17/20 0340 03/20/20 0717 03/22/20 0631  NA 137   < > 138  K 4.0   < > 4.6  CL 96*   < > 97*  CO2 28   < > 23  BUN 32*   < > 29*  CREATININE 6.20*   < > 6.22*  CALCIUM 7.8*   < > 8.3*  PROT 6.8  --   --   BILITOT 1.0  --   --   ALKPHOS 110  --   --   ALT 30  --   --   AST 35  --   --   GLUCOSE 123*   < > 115*   < > = values in this interval not displayed.   Lab Results  Component Value Date   CHOL 130 03/16/2019   HDL 41 03/16/2019   LDLCALC 48 03/16/2019   TRIG 207 (H) 03/16/2019   No results found for:  DDIMER  Radiology/Studies:  CT Angio Head W or Wo Contrast  Result Date: 03/20/2020 CLINICAL DATA:  Head trauma with intracranial arterial injury suspected EXAM: CT ANGIOGRAPHY HEAD AND NECK TECHNIQUE:  Multidetector CT imaging of the head and neck was performed using the standard protocol during bolus administration of intravenous contrast. Multiplanar CT image reconstructions and MIPs were obtained to evaluate the vascular anatomy. Carotid stenosis measurements (when applicable) are obtained utilizing NASCET criteria, using the distal internal carotid diameter as the denominator. CONTRAST:  63mL OMNIPAQUE IOHEXOL 350 MG/ML SOLN COMPARISON:  Head CT from earlier today. FINDINGS: CTA NECK FINDINGS Aortic arch: Atheromatous calcification.  No aneurysm or dissection Right carotid system: Atheromatous wall thickening with calcification at the bifurcation. No stenosis or ulceration. Left carotid system: Atheromatous wall thickening of the common carotid. No flow limiting stenosis or ulceration. Vertebral arteries: No proximal subclavian stenosis. There are codominant vertebral arteries that are smooth and widely patent to the dura. Skeleton: No acute finding. Other neck: Left-sided sinusitis with OMU obstructive pattern and cortical breakthrough at the posterior wall left maxillary sinus, described on preceding head CT. Upper chest: Moderate, layering right pleural effusion. Generous mediastinal lymph nodes, likely reactive. Review of the MIP images confirms the above findings CTA HEAD FINDINGS Anterior circulation: Heavily calcified carotid siphons with up to 50% stenosis on the right. No branch occlusion, beading, aneurysm, or evidence of vascular malformation. Broad appearance at the anterior communicating artery without discrete aneurysm. Essentially azygos A2 segment. Posterior circulation: Codominant vertebral arteries. Atheromatous plaque asymmetric to the left V4 segment. The vertebral and basilar arteries  are smooth and widely patent. Fetal type left PCA. Negative for aneurysm or vascular malformation Venous sinuses: Unremarkable Anatomic variants: As above Review of the MIP images confirms the above findings IMPRESSION: 1. No arterial cause/injury seen underlying the traumatic pattern subarachnoid hemorrhage. 2. Cervical and intracranial carotid atherosclerosis with up to 50% stenosis at the right cavernous ICA. 3. Layering right pleural effusion. 4. Complicated left sinusitis as previously described. Electronically Signed   By: Monte Fantasia M.D.   On: 03/20/2020 10:13   DG Chest 2 View  Result Date: 03/20/2020 CLINICAL DATA:  Assess for pleural effusion. EXAM: CHEST - 2 VIEW COMPARISON:  March 15, 2020 FINDINGS: The mediastinal contour and cardiac silhouette are stable. Heart size is enlarged. Mild increased pulmonary interstitium is identified bilaterally unchanged. There is minimal right pleural effusion. There is no left pleural effusion. The bony structures are stable. IMPRESSION: Minimal right pleural effusion. Electronically Signed   By: Abelardo Diesel M.D.   On: 03/20/2020 11:16   CT HEAD WO CONTRAST  Result Date: 03/20/2020 CLINICAL DATA:  Initial evaluation for acute mental status change. Worsening. EXAM: CT HEAD WITHOUT CONTRAST TECHNIQUE: Contiguous axial images were obtained from the base of the skull through the vertex without intravenous contrast. COMPARISON:  Prior head CT from earlier the same day. FINDINGS: Brain: Posttraumatic subarachnoid hemorrhage involving the peripheral left frontotemporal convexity again seen, not significantly changed in overall appearance and volume from previous. Suspected superimposed hemorrhagic contusion also similar. Overlying subdural hematoma also not significantly changed, measuring up to 5-6 mm in maximal thickness. Trace 2 mm left-to-right shift, unchanged. No hydrocephalus or ventricular trapping. Basilar cisterns remain patent. No new intracranial  hemorrhage. No acute large vessel territory infarct. Underlying atrophy with chronic small vessel ischemic disease again noted. No appreciable mass lesion. Vascular: No asymmetric hyperdense vessel. Calcified atherosclerosis present at the skull base. Skull: Evolving right frontoparietal scalp contusion. Calvarium intact. Sinuses/Orbits: Globes and orbital soft tissues demonstrate no acute finding. Chronic left frontoethmoidal and maxillary sinusitis again noted. Mastoids remain clear. Other: None. IMPRESSION: 1. No significant interval change in left frontotemporal convexity  subarachnoid hemorrhage with probable superimposed hemorrhagic contusion. Overlying extra-axial hemorrhage also not significantly changed, likely subdural. Trace 2 mm left-to-right shift, unchanged. No hydrocephalus or ventricular trapping. 2. No other new acute intracranial abnormality. 3. Evolving right frontoparietal scalp contusion. No calvarial fracture. 4. Chronic left frontoethmoidal and maxillary sinusitis. Electronically Signed   By: Jeannine Boga M.D.   On: 03/20/2020 20:20   CT Head Wo Contrast  Result Date: 03/20/2020 CLINICAL DATA:  Follow-up of left temporal lobe subarachnoid and possible subdural hemorrhage. EXAM: CT HEAD WITHOUT CONTRAST TECHNIQUE: Contiguous axial images were obtained from the base of the skull through the vertex without intravenous contrast. COMPARISON:  Earlier today at 7:22 a.m. FINDINGS: 2:32 p.m. Brain: Cerebral atrophy. Mild increase in small volume subarachnoid hemorrhage adjacent the left temporal and frontal lobes, including on 23/2. Small volume subdural hemorrhage including at 4 mm on 14/2 is increased, especially superiorly. Example adjacent the left frontal lobe near the vertex on coronal image 24/4 at 3 mm. Adjacent the left parietal lobe at up to 5 mm on coronal image 43, new. No significant mass effect or midline shift. No complicating infarct. No hydrocephalus or intraventricular  hemorrhage. Mild low density in the periventricular white matter likely related to small vessel disease. Vascular: Intracranial atherosclerosis. Skull: Posterior scalp soft tissue swelling is greater right than left and relatively similar. No skull fracture. Sinuses/Orbits: Normal imaged portions of the orbits and globes. Left sided sinus opacification was detailed on CT of earlier today. Other: None. IMPRESSION: 1. Mild increase in subarachnoid hemorrhage adjacent the left temporal and frontal lobes. 2. Small volume extra-axial hematoma, favoring subdural, mildly increased especially superiorly. 3.  Cerebral atrophy and small vessel ischemic change. Electronically Signed   By: Abigail Miyamoto M.D.   On: 03/20/2020 15:02   CT Head Wo Contrast  Result Date: 03/20/2020 CLINICAL DATA:  Fall with facial trauma EXAM: CT HEAD WITHOUT CONTRAST CT MAXILLOFACIAL WITHOUT CONTRAST CT CERVICAL SPINE WITHOUT CONTRAST TECHNIQUE: Multidetector CT imaging of the head, cervical spine, and maxillofacial structures were performed using the standard protocol without intravenous contrast. Multiplanar CT image reconstructions of the cervical spine and maxillofacial structures were also generated. COMPARISON:  01/25/2020 FINDINGS: CT HEAD FINDINGS Brain: High-density/acute hemorrhage in the subarachnoid and possibly subdural spaces over the lateral left temporal lobe, in total measuring up to 3 mm in thickness. No associated mass effect. No visible parenchymal hemorrhage or swelling. Brain atrophy and chronic small vessel ischemia. No evidence of acute infarct. 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 Vascular: No hyperdense vessel or unexpected calcification. Skull: Posterior scalp swelling without calvarial fracture. CT MAXILLOFACIAL FINDINGS Osseous: No acute fracture or mandibular dislocation. Orbits: Swelling superficial to the right orbit. No visible globe or postseptal injury. Bilateral cataract resection. The superior  ophthalmic veins appear distended on both sides, often related to Valsalva. Sinuses: Chronic sinusitis with opacified left maxillary, anterior ethmoid, and frontal sinuses. Left maxillary wall is sclerotic and thickened with a broad dehiscence along the posterolateral wall that is traversed by partially calcified material. No sinus mass was seen on a April 07, 2009 brain MRI, but dehiscence had not been noted at that time either. Soft tissues: Hazy retro antral fat on the left, stable where covered on prior head CT 01/25/2020. CT CERVICAL SPINE FINDINGS Alignment: No traumatic malalignment. Skull base and vertebrae: Negative for cervical spine fracture. T1 and T2 superior endplate fractures with trabecular impaction, nonacute in seen on a 2021 chest CT. Soft tissues and spinal canal: No prevertebral  fluid or swelling. No visible canal hematoma. Mildly enlarged lymph nodes in the upper right jugular chain, posterior to the internal jugular vein, unchanged from November 2021 and thus presumably reactive Disc levels: Multilevel degenerative facet spurring asymmetric to the right. Ordinary mid and lower cervical disc degeneration. Upper chest: Layering right pleural effusion, small where covered. Critical Value/emergent results were called by telephone at the time of interpretation on 03/20/2020 at 8:00 am to provider Lavonia Drafts , who verbally acknowledged these results. IMPRESSION: 1. Small volume subarachnoid and possibly subdural hemorrhage along the superficial left temporal lobe. No significant mass effect. 2. Scalp and facial swelling without acute fracture. 3. Negative for cervical spine fracture. 4. 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 ENT referral is recommended if appropriate for comorbidities. Electronically Signed   By:  Monte Fantasia M.D.   On: 03/20/2020 08:07   CT Angio Neck W and/or Wo Contrast  Result Date: 03/20/2020 CLINICAL DATA:  Head trauma with intracranial arterial injury suspected EXAM: CT ANGIOGRAPHY HEAD AND NECK TECHNIQUE: Multidetector CT imaging of the head and neck was performed using the standard protocol during bolus administration of intravenous contrast. Multiplanar CT image reconstructions and MIPs were obtained to evaluate the vascular anatomy. Carotid stenosis measurements (when applicable) are obtained utilizing NASCET criteria, using the distal internal carotid diameter as the denominator. CONTRAST:  70mL OMNIPAQUE IOHEXOL 350 MG/ML SOLN COMPARISON:  Head CT from earlier today. FINDINGS: CTA NECK FINDINGS Aortic arch: Atheromatous calcification.  No aneurysm or dissection Right carotid system: Atheromatous wall thickening with calcification at the bifurcation. No stenosis or ulceration. Left carotid system: Atheromatous wall thickening of the common carotid. No flow limiting stenosis or ulceration. Vertebral arteries: No proximal subclavian stenosis. There are codominant vertebral arteries that are smooth and widely patent to the dura. Skeleton: No acute finding. Other neck: Left-sided sinusitis with OMU obstructive pattern and cortical breakthrough at the posterior wall left maxillary sinus, described on preceding head CT. Upper chest: Moderate, layering right pleural effusion. Generous mediastinal lymph nodes, likely reactive. Review of the MIP images confirms the above findings CTA HEAD FINDINGS Anterior circulation: Heavily calcified carotid siphons with up to 50% stenosis on the right. No branch occlusion, beading, aneurysm, or evidence of vascular malformation. Broad appearance at the anterior communicating artery without discrete aneurysm. Essentially azygos A2 segment. Posterior circulation: Codominant vertebral arteries. Atheromatous plaque asymmetric to the left V4 segment. The vertebral  and basilar arteries are smooth and widely patent. Fetal type left PCA. Negative for aneurysm or vascular malformation Venous sinuses: Unremarkable Anatomic variants: As above Review of the MIP images confirms the above findings IMPRESSION: 1. No arterial cause/injury seen underlying the traumatic pattern subarachnoid hemorrhage. 2. Cervical and intracranial carotid atherosclerosis with up to 50% stenosis at the right cavernous ICA. 3. Layering right pleural effusion. 4. Complicated left sinusitis as previously described. Electronically Signed   By: Monte Fantasia M.D.   On: 03/20/2020 10:13   CT Cervical Spine Wo Contrast  Result Date: 03/20/2020 CLINICAL DATA:  Fall with facial trauma EXAM: CT HEAD WITHOUT CONTRAST CT MAXILLOFACIAL WITHOUT CONTRAST CT CERVICAL SPINE WITHOUT CONTRAST TECHNIQUE: Multidetector CT imaging of the head, cervical spine, and maxillofacial structures were performed using the standard protocol without intravenous contrast. Multiplanar CT image reconstructions of the cervical spine and maxillofacial structures were also generated. COMPARISON:  01/25/2020  FINDINGS: CT HEAD FINDINGS Brain: High-density/acute hemorrhage in the subarachnoid and possibly subdural spaces over the lateral left temporal lobe, in total measuring up to 3 mm in thickness. No associated mass effect. No visible parenchymal hemorrhage or swelling. Brain atrophy and chronic small vessel ischemia. No evidence of acute infarct. 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 Vascular: No hyperdense vessel or unexpected calcification. Skull: Posterior scalp swelling without calvarial fracture. CT MAXILLOFACIAL FINDINGS Osseous: No acute fracture or mandibular dislocation. Orbits: Swelling superficial to the right orbit. No visible globe or postseptal injury. Bilateral cataract resection. 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 Sinuses: Chronic sinusitis with opacified  left maxillary, anterior ethmoid, and frontal sinuses. Left maxillary wall is sclerotic and thickened with a broad dehiscence along the posterolateral wall that is traversed by partially calcified material. No sinus mass was seen on a April 07, 2009 brain MRI, but dehiscence had not been noted at that time either. Soft tissues: Hazy retro antral fat on the left, stable where covered on prior head CT 01/25/2020. CT CERVICAL SPINE FINDINGS Alignment: No traumatic malalignment. Skull base and vertebrae: Negative for cervical spine fracture. T1 and T2 superior endplate fractures with trabecular impaction, nonacute in seen on a 2021 chest CT. Soft tissues and spinal canal: No prevertebral fluid or swelling. No visible canal hematoma. Mildly enlarged lymph nodes in the upper right jugular chain, posterior to the internal jugular vein, unchanged from November 2021 and thus presumably reactive Disc levels: Multilevel degenerative facet spurring asymmetric to the right. Ordinary mid and lower cervical disc degeneration. Upper chest: Layering right pleural effusion, small where covered. Critical Value/emergent results were called by telephone at the time of interpretation on 03/20/2020 at 8:00 am to provider Lavonia Drafts , who verbally acknowledged these results. IMPRESSION: 1. Small volume subarachnoid and possibly subdural hemorrhage along the superficial left temporal lobe. No significant mass effect. 2. Scalp and facial swelling without acute fracture. 3. Negative for cervical spine fracture. 4. 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 ENT referral is recommended if appropriate for comorbidities. Electronically Signed   By: Monte Fantasia M.D.   On: 03/20/2020 08:07   DG Chest Portable 1 View  Result Date: 03/15/2020 CLINICAL DATA:  Altered  mental status. EXAM: PORTABLE CHEST 1 VIEW COMPARISON:  January 25, 2020 FINDINGS: Chronic appearing increased lung markings, without evidence of acute infiltrate, pleural effusion or pneumothorax. The cardiac silhouette is moderately enlarged. Is marked severity calcification of the aortic arch. Chronic right-sided rib fractures are seen. Multilevel degenerative changes are noted throughout the thoracic spine. IMPRESSION: Stable cardiomegaly with chronic appearing increased lung markings. Electronically Signed   By: Virgina Norfolk M.D.   On: 03/15/2020 20:56   ECHOCARDIOGRAM COMPLETE  Result Date: 03/22/2020    ECHOCARDIOGRAM REPORT   Patient Name:   Kyle [name] Date of Exam: 03/21/2020 Medical Rec #:  [id number]          Height:       72.0 in Accession #:    [id number]         Weight:       176.0 lb Date of Birth:  [birth year]          BSA:          2.018 m Patient Age:    22 years  BP:           181/83 mmHg Patient Gender: M                  HR:           81 bpm. Exam Location:  [facility] Procedure: 2D Echo, Cardiac Doppler and Color Doppler Indications:     Syncope 780.2 / R55  History:         Patient has prior history of Echocardiogram examinations.                  Stroke; Risk Factors:Hypertension.  Sonographer:     Alyse Low Roar Referring Phys:  [place] Diagnosing Phys: Bartholome Bill MD IMPRESSIONS  1. Left ventricular ejection fraction, by estimation, is 35 to 40%. The left ventricle has moderately decreased function. The left ventricle demonstrates global hypokinesis. The left ventricular internal cavity size was mildly dilated. There is mild left ventricular hypertrophy. Left ventricular diastolic parameters were normal.  2. Right ventricular systolic function is mildly reduced. The right ventricular size is moderately enlarged.  3. Left atrial size was mild to moderately dilated.  4. Right atrial size was mildly dilated.  5. The mitral valve is grossly normal. Mild mitral  valve regurgitation.  6. The aortic valve is grossly normal. Aortic valve regurgitation is not visualized. FINDINGS  Left Ventricle: Left ventricular ejection fraction, by estimation, is 35 to 40%. The left ventricle has moderately decreased function. The left ventricle demonstrates global hypokinesis. The left ventricular internal cavity size was mildly dilated. There is mild left ventricular hypertrophy. Left ventricular diastolic parameters were normal. Right Ventricle: The right ventricular size is moderately enlarged. No increase in right ventricular wall thickness. Right ventricular systolic function is mildly reduced. Left Atrium: Left atrial size was mild to moderately dilated. Right Atrium: Right atrial size was mildly dilated. Pericardium: There is no evidence of pericardial effusion. Mitral Valve: The mitral valve is grossly normal. Mild mitral valve regurgitation. Tricuspid Valve: The tricuspid valve is grossly normal. Tricuspid valve regurgitation is mild. Aortic Valve: The aortic valve is grossly normal. Aortic valve regurgitation is not visualized. Aortic valve peak gradient measures 4.6 mmHg. Pulmonic Valve: The pulmonic valve was not well visualized. Pulmonic valve regurgitation is trivial. Aorta: The aortic root is normal in size and structure. IAS/Shunts: No atrial level shunt detected by color flow Doppler.  LEFT VENTRICLE PLAX 2D LVIDd:         5.37 cm      Diastology LVIDs:         4.42 cm      LV e' medial:   4.35 cm/s LV PW:         1.19 cm      LV E/e' medial: 23.0 LV IVS:        1.14 cm LVOT diam:     1.90 cm LVOT Area:     2.84 cm  LV Volumes (MOD) LV vol d, MOD A2C: 151.0 ml LV vol d, MOD A4C: 132.0 ml LV vol s, MOD A2C: 97.5 ml LV vol s, MOD A4C: 81.6 ml LV SV MOD A2C:     53.5 ml LV SV MOD A4C:     132.0 ml LV SV MOD BP:      50.5 ml RIGHT VENTRICLE RV Mid diam:    4.75 cm RV S prime:     7.07 cm/s TAPSE (M-mode): 1.0 cm LEFT ATRIUM  Index       RIGHT ATRIUM           Index  LA diam:        4.80 cm  2.38 cm/m  RA Area:     31.10 cm LA Vol (A2C):   116.0 ml 57.49 ml/m RA Volume:   114.00 ml 56.49 ml/m LA Vol (A4C):   107.0 ml 53.03 ml/m LA Biplane Vol: 112.0 ml 55.50 ml/m  AORTIC VALVE                PULMONIC VALVE AV Area (Vmax): 1.73 cm    RVOT Peak grad: 1 mmHg AV Vmax:        107.00 cm/s AV Peak Grad:   4.6 mmHg LVOT Vmax:      65.10 cm/s  AORTA Ao Root diam: 3.40 cm MITRAL VALVE                TRICUSPID VALVE MV Area (PHT): 4.29 cm     TR Peak grad:   37.2 mmHg MV Decel Time: 177 msec     TR Vmax:        305.00 cm/s MV E velocity: 100.00 cm/s                             SHUNTS                             Systemic Diam: 1.90 cm Bartholome Bill MD Electronically signed by Bartholome Bill MD Signature Date/Time: 03/22/2020/7:42:50 AM    Final    CT Maxillofacial Wo Contrast  Result Date: 03/20/2020 CLINICAL DATA:  Fall with facial trauma EXAM: CT HEAD WITHOUT CONTRAST CT MAXILLOFACIAL WITHOUT CONTRAST CT CERVICAL SPINE WITHOUT CONTRAST TECHNIQUE: Multidetector CT imaging of the head, cervical spine, and maxillofacial structures were performed using the standard protocol without intravenous contrast. Multiplanar CT image reconstructions of the cervical spine and maxillofacial structures were also generated. COMPARISON:  01/25/2020 FINDINGS: CT HEAD FINDINGS Brain: High-density/acute hemorrhage in the subarachnoid and possibly subdural spaces over the lateral left temporal lobe, in total measuring up to 3 mm in thickness. No associated mass effect. No visible parenchymal hemorrhage or swelling. Brain atrophy and chronic small vessel ischemia. No evidence of acute infarct. 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 Vascular: No hyperdense vessel or unexpected calcification. Skull: Posterior scalp swelling without calvarial fracture. CT MAXILLOFACIAL FINDINGS Osseous: No acute fracture or mandibular dislocation. Orbits: Swelling superficial to the right orbit. No visible globe  or postseptal injury. Bilateral cataract resection. 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 Sinuses: Chronic sinusitis with opacified left maxillary, anterior ethmoid, and frontal sinuses. Left maxillary wall is sclerotic and thickened with a broad dehiscence along the posterolateral wall that is traversed by partially calcified material. No sinus mass was seen on a April 07, 2009 brain MRI, but dehiscence had not been noted at that time either. Soft tissues: Hazy retro antral fat on the left, stable where covered on prior head CT 01/25/2020. CT CERVICAL SPINE FINDINGS Alignment: No traumatic malalignment. Skull base and vertebrae: Negative for cervical spine fracture. T1 and T2 superior endplate fractures with trabecular impaction, nonacute in seen on a 2021 chest CT. Soft tissues and spinal canal: No prevertebral fluid or swelling. No visible canal hematoma. Mildly enlarged lymph nodes in the upper right jugular chain, posterior to the internal jugular  vein, unchanged from November 2021 and thus presumably reactive Disc levels: Multilevel degenerative facet spurring asymmetric to the right. Ordinary mid and lower cervical disc degeneration. Upper chest: Layering right pleural effusion, small where covered. Critical Value/emergent results were called by telephone at the time of interpretation on 03/20/2020 at 8:00 am to provider Lavonia Drafts , who verbally acknowledged these results. IMPRESSION: 1. Small volume subarachnoid and possibly subdural hemorrhage along the superficial left temporal lobe. No significant mass effect. 2. Scalp and facial swelling without acute fracture. 3. Negative for cervical spine fracture. 4. 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 ENT referral is recommended if  appropriate for comorbidities. Electronically Signed   By: Monte Fantasia M.D.   On: 03/20/2020 08:07    EKG: Normal sinus rhythm with left axis deviation and right bundle branch block  Weights: Filed Weights   03/20/20 0705 03/21/20 1728 03/22/20 0100  Weight: 79.8 kg 80.1 kg 80.7 kg     Physical Exam: Blood pressure (!) 181/83, pulse 77, temperature 98 F (36.7 C), temperature source Oral, resp. rate 19, height 6' (1.829 m), weight 80.7 kg, SpO2 98 %. Body mass index is 24.13 kg/m. General: Well developed, well nourished, in no acute distress. Head eyes ears nose throat: Normocephalic, atraumatic, sclera non-icteric, no xanthomas, nares are without discharge. No apparent thyromegaly and/or mass  Lungs: Normal respiratory effort.  no wheezes, no rales, no rhonchi.  Heart: RRR with normal S1 S2. no murmur gallop, no rub, PMI is normal size and placement, carotid upstroke normal without bruit, jugular venous pressure is normal Abdomen: Soft, non-tender, non-distended with normoactive bowel sounds. No hepatomegaly. No rebound/guarding. No obvious abdominal masses. Abdominal aorta is normal size without bruit Extremities: No edema. no cyanosis, no clubbing, no ulcers  Peripheral : 2+ bilateral upper extremity pulses, 2+ bilateral femoral pulses, 2+ bilateral dorsal pedal pulse Neuro: Alert and oriented. No facial asymmetry. No focal deficit. Moves all extremities spontaneously. Musculoskeletal: Normal muscle tone without kyphosis Psych:  Responds to questions appropriately with a normal affect.    Assessment: 70 year old male with previous cerebrovascular accident peripheral vascular disease sleep apnea diabetes chronic kidney disease stage V chronic anemia with pleural effusion having recurrent episodes of falling and injury of unknown etiology without evidence of primary source and no current evidence of congestive heart failure myocardial infarction or acute coronary  syndrome  Plan: 1.  Continue supportive care of recent injury to right face 2.  Further consideration of Linq device placed for assessment of rhythm disturbances that cannot be seen by previous monitoring.  Would plan on trying to schedule this for Wednesday a.m. 3.  Continue treatment with dialysis without restriction 4.  Continuation of metoprolol for heart rate and blood pressure control and high intensity cholesterol therapy for further risk reduction cardiovascular event with previous history of cerebrovascular accident  Signed, Kyle [name] M.D. [facility] 03/22/2020, 1:10 PM

## 2020-03-22 NOTE — Evaluation (Signed)
Physical Therapy Evaluation Patient Details Name: Vasil Juhasz MRN: [id number] DOB: [birth year] Today's Date: 03/22/2020   History of Present Illness  Patient is a 70 year old male with PMH of ESRD on dialysis MWF, CVA with left side weakness , chronic diastolic heart failure, COPD, PAD, OSA, history of GI bleed, insulin-dependent diabetes, hyperlipidemia presents to the ER following syncopal episode and fall.  Patient has developed huge hematoma over his right eye after he was found on the floor. CT head and cervical spine shows small volume subarachnoid hemorrhage and possible subdural hemorrhage, along the superficial temporal lobe.  Clinical Impression  Patient has difficulty follow single step commands and needs visual cues and gestures.  Patient needs assistance for bed mobility. Extra time required to complete tasks. After multiple unsuccessful attempts, patient is able to stand with Mod A with cues for technique. Patient unable to state if he feels dizzy in standing due to communication issues, however does close his eyes for a few seconds initially with standing. Blood pressure 167/82 mmHg after standing. Unsafe to progress mobility further at this time. Recommend PT to maximize independence and address functional limitations listed below. SNF is recommended at discharge.     Follow Up Recommendations SNF    Equipment Recommendations   (to de determined at next level of care)    Recommendations for Other Services       Precautions / Restrictions Precautions Precautions: Fall Restrictions Weight Bearing Restrictions: No      Mobility  Bed Mobility Overal bed mobility: Needs Assistance Bed Mobility: Supine to Sit;Sit to Supine     Supine to sit: Min assist Sit to supine: Mod assist   General bed mobility comments: assistance for trunk support to sit upright and assistance for BLE support to return to  bed. verbal and visual cues for sequending and technique     Transfers Overall transfer level: Needs assistance Equipment used: None Transfers: Sit to/from Stand Sit to Stand: Mod assist         General transfer comment: patient attempts multiple times to stand unsucessfully. with maximal cues for technique, patient able to stand with lifting and lowering assistance provided during transfer  Ambulation/Gait             General Gait Details: unable/unsafe to attempt at this time  Stairs            Wheelchair Mobility    Modified Rankin (Stroke Patients Only)       Balance Overall balance assessment: Needs assistance Sitting-balance support: Feet supported Sitting balance-Leahy Scale: Fair Sitting balance - Comments: close stand by assistance provided for safety with no gross loss of balance   Standing balance support: Single extremity supported Standing balance-Leahy Scale: Poor Standing balance comment: Min A - Mod A provided to maintain standing balance with standing tolerance of less than one minute                             Pertinent Vitals/Pain Pain Assessment: No/denies pain    Home Living Family/patient expects to be discharged to:: Private residence Living Arrangements: Spouse/significant other               Additional Comments: information gathered through the chart. patient was unable to provide information at this time due to communication deficits    Prior Function           Comments: per chart review, patient ambulatory with rolling walker prior  to admission with history of falls     Hand Dominance        Extremity/Trunk Assessment   Upper Extremity Assessment Upper Extremity Assessment: Defer to OT evaluation    Lower Extremity Assessment Lower Extremity Assessment: Generalized weakness (patient unable to follow commands for formal MMT. patient is able to perform partial SLR and stand with assistance)       Communication   Communication: Receptive  difficulties;Expressive difficulties  Cognition Arousal/Alertness: Awake/alert Behavior During Therapy: WFL for tasks assessed/performed Overall Cognitive Status: No family/caregiver present to determine baseline cognitive functioning                                 General Comments: patient answering "yes" or "I guess so" to all questions answered. unable to assess orientation due to aphaisa. Patient has difficulty following single step commands and needs extra time, repetition, and gestures.      General Comments      Exercises     Assessment/Plan    PT Assessment Patient needs continued PT services  PT Problem List Decreased strength;Decreased activity tolerance;Decreased balance;Decreased mobility;Decreased cognition;Decreased knowledge of use of DME;Decreased safety awareness;Decreased knowledge of precautions       PT Treatment Interventions DME instruction;Gait training;Stair training;Functional mobility training;Therapeutic activities;Therapeutic exercise;Balance training;Neuromuscular re-education;Cognitive remediation;Patient/family education    PT Goals (Current goals can be found in the Care Plan section)  Acute Rehab PT Goals Patient Stated Goal: patient unable to state PT Goal Formulation: Patient unable to participate in goal setting Time For Goal Achievement: 04/05/20 Potential to Achieve Goals: Fair    Frequency Min 2X/week   Barriers to discharge        Co-evaluation               AM-PAC PT "6 Clicks" Mobility  Outcome Measure Help needed turning from your back to your side while in a flat bed without using bedrails?: A Little Help needed moving from lying on your back to sitting on the side of a flat bed without using bedrails?: A Lot Help needed moving to and from a bed to a chair (including a wheelchair)?: A Lot Help needed standing up from a chair using your arms (e.g., wheelchair or bedside chair)?: A Lot Help needed to walk in  hospital room?: A Lot Help needed climbing 3-5 steps with a railing? : Total 6 Click Score: 12    End of Session Equipment Utilized During Treatment: Gait belt Activity Tolerance: Patient limited by fatigue Patient left: in bed;with call bell/phone within reach;with bed alarm set Nurse Communication: Mobility status PT Visit Diagnosis: Other abnormalities of gait and mobility (R26.89);Muscle weakness (generalized) (M62.81);Unsteadiness on feet (R26.81);History of falling (Z91.81);Difficulty in walking, not elsewhere classified (R26.2)    Time: 5625-6389 PT Time Calculation (min) (ACUTE ONLY): 18 min   Charges:   PT Evaluation $PT Eval Moderate Complexity: 1 Mod PT Treatments $Therapeutic Activity: 8-22 mins        Minna Merritts, PT, MPT   Percell Locus 03/22/2020, 12:41 PM

## 2020-03-22 NOTE — TOC Initial Note (Signed)
Transition of Care [facility]) - Initial/Assessment Note    Patient Details  Name: Kyle [name] MRN: [id number] Date of Birth: Feb 29, 1950  Transition of Care [facility]) CM/SW Contact:    Ova Freshwater Phone Number: [phone] 03/22/2020, 8:30 AM  Clinical Narrative:                  Patient returns to [facility] ED after discharge from ED with [facility] home health on 03/17/2020.  CSW spoke with patient's Hua,[name] R (Spouse) [phone] who stated the patient is non-compliant and continues to fall at home, "because he won't do the exercised the PT gives him if the PT os not here."  Ms. Peace stated she would like for the patient to speak with palliative care because she feels they will receive "the type of care he needs."  Ms. Doten stated the patient is afraid if he agrees to palliative care they will not allow him to continue with dialysis. CSW explained that is not how palliative works, and I would request a palliative consult for someone to come ans speak with him.  CSW reach out to Attending and requested palliative care consult.  Expected Discharge Plan: Skilled Nursing Facility Barriers to Discharge: Continued Medical Work up,Family Issues,SNF Pending bed offer   Patient Goals and CMS Choice        Expected Discharge Plan and Services Expected Discharge Plan: [place] In-house Referral: Clinical Social Work   Post Acute Care Choice: [place] Living arrangements for the past 2 months: Single Family Home                                      Prior Living Arrangements/Services Living arrangements for the past 2 months: Single Family Home Lives with:: Spouse Patient language and need for interpreter reviewed:: Yes Do you feel safe going back to the place where you live?: Yes      Need for Family Participation in Patient Care: Yes (Comment) Care giver support system in place?: Yes (comment) Current home services: Home  PT,Home RN,Homehealth aide,DME [facility]) Criminal Activity/Legal Involvement Pertinent to Current Situation/Hospitalization: No - Comment as needed  Activities of Daily Living      Permission Sought/Granted Permission sought to share information with : Family Supports    Share Information with NAME: Depaula,[name] R (Spouse)   [phone] (Mobile  Permission granted to share info w AGENCY: [facility]        Emotional Assessment Appearance:: Appears older than stated age Attitude/Demeanor/Rapport: Angry,Reactive Affect (typically observed): Irritable Orientation: : Oriented to Self,Oriented to Place,Oriented to  Time,Oriented to Situation Alcohol / Substance Use: Not Applicable Psych Involvement: No (comment)  Admission diagnosis:  Syncope and collapse [R55] Subdural hematoma ([place]) [S06.5X9A] Fall, initial encounter [W19.XXXA] Syncope, unspecified syncope type [R55] Subarachnoid hematoma with loss of consciousness, initial encounter [facility]) [S06.6X9A] Patient Active Problem List   Diagnosis Date Noted  . Syncope and collapse 03/20/2020  . SAH (subarachnoid hemorrhage) ([place]) 03/20/2020  . Chronic sinusitis 03/20/2020  . Chronic diastolic CHF (congestive heart failure) ([place]) 03/16/2020  . Hyperkalemia 03/15/2020  . Seizure ([place]) 01/25/2020  . Depression   . Acute GI bleeding 07/28/2019  . Melena 07/22/2019  . History of GI bleed 07/22/2019  . Current every day smoker 06/22/2019  . Falls frequently 06/22/2019  . Palliative care by specialist   . Goals of care, counseling/discussion   . Orthostasis 05/02/2019  .  ESRD (end stage renal disease) on dialysis ([place])   . Acute blood loss anemia 04/30/2019  . Acute on chronic blood loss anemia 04/29/2019  . History of CVA (cerebrovascular accident) 04/29/2019  . GI bleed 04/04/2019  . 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[place]) 03/20/2019  . Generalized weakness   . Diabetic polyneuropathy associated with type 2 diabetes mellitus ([place])   .  Cerebrovascular accident (CVA) ([place])   . PAD (peripheral artery disease) ([place]) 03/17/2019  . Ptosis of eyelid, left 03/15/2019  . Physical deconditioning 03/14/2019  . Hypomagnesemia 03/13/2019  . Hyperphosphatemia 03/13/2019  . ESRD (end stage renal disease) ([place]) 03/13/2019  . Chronic kidney disease with peritoneal dialysis as preferred modality, stage 5 ([place]) 03/13/2019  . Prolonged QT interval 03/13/2019  . Hypertension associated with diabetes ([place]) 03/13/2019  . Hypothyroidism 03/13/2019  . Chronic diarrhea 03/13/2019  . Hypocalcemia 03/12/2019  . Ataxia 03/10/2019  . Dizziness 03/10/2019  . Vision problem 03/10/2019  . Hypokalemia 09/16/2018  . ESRD on peritoneal dialysis ([place]) 03/04/2018  . Lung nodule 06/12/2016  . Narrowing of airway   . Cigarette smoker 05/24/2016  . Collapse of right lung 05/24/2016  . COPD with chronic bronchitis ([place]) 05/24/2016  . Diarrhea due to malabsorption 07/01/2015  . Hx of adenomatous colonic polyps 07/01/2015  . Acne 05/20/2015  . Calculus of kidney 05/20/2015  . Chest pain, non-cardiac 05/20/2015  . Seizure disorder ([place]) 05/20/2015  . Current tobacco use 05/20/2015  . Chronic kidney disease (CKD), stage III (moderate) ([place]) 03/29/2015  . Insulin dependent type 2 diabetes mellitus ([place]) 03/15/2015  . Lipoma of shoulder 03/08/2014  . Other synovitis and tenosynovitis, right shoulder 03/08/2014  . Right supraspinatus tenosynovitis 03/08/2014  . Bursitis of elbow 02/15/2014  . Olecranon bursitis of right elbow 02/15/2014  . Absolute anemia 08/08/2013  . 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 Back pain, chronic 08/08/2013  . Chronic obstructive pulmonary disease ([place]) 08/08/2013  . BP (high blood pressure) 08/08/2013  . Hyperlipidemia associated with type 2 diabetes mellitus ([place]) 08/08/2013  . Acne erythematosa 08/08/2013   PCP:  Idelle Crouch, MD Pharmacy:   [facility] [address] [place] [address] Phone: [phone] Fax: [place] [address] [address] Phone: [phone] Fax: [place], [place] [address] Phone: [phone] Fax: [phone]     Social Determinants of Health (SDOH) Interventions    Readmission Risk Interventions Readmission Risk Prevention Plan 01/26/2020  Transportation Screening Complete  Medication Review ([place]) Complete  PCP or Specialist appointment within 3-5 days of discharge Complete  HRI or Home Care Consult Complete  SW Recovery Care/Counseling Consult Complete  [place] Complete  Some recent data might be hidden

## 2020-03-22 NOTE — Progress Notes (Signed)
PROGRESS NOTE    Kyle [name]  ALP:[id number] DOB: Nov 18, 1950 DOA: 03/20/2020 PCP: Idelle Crouch, MD   Brief Narrative:  This 70 years old male with PMH significant for ESRD on dialysis MWF, CVA, chronic diastolic heart failure, COPD, PAD, OSA, history of GI bleed, insulin-dependent diabetes, hyperlipidemia presents to the ER for the evaluation of fall following syncopal episode.  Patient has developed huge hematoma over his right eye after he was found on the floor.  Patient does not remember how did this happen. CT head and cervical spine shows small volume subarachnoid hemorrhage and possible subdural hemorrhage, along the superficial temporal lobe.  No significant mass-effect. Of note patient was recently discharged from hospital about 3 days ago where he was evaluated for GI bleed.   Patient is admitted for possible syncopal episode, Neurosurgery, Cardiology and Nephrology consults were called.  Patient underwent hemodialysis on 1/24.  Renal status is stable.  Cardiology is recommending Linq device tomorrow for detection of heart arrhythmias causing syncope.  Neurosurgery recommended no intervention.   Assessment & Plan:   Principal Problem:   Syncope and collapse Active Problems:   Chronic obstructive pulmonary disease (HCC)   Seizure disorder (HCC)   Hypothyroidism   ESRD (end stage renal disease) on dialysis (HCC)   Falls frequently   Depression   Chronic diastolic CHF (congestive heart failure) (HCC)   SAH (subarachnoid hemorrhage) (HCC)   Chronic sinusitis  Syncope and collapse: Patient was found on the floor by his wife following a fall. Patient does not remember the events and wife did not see any jerking motions suggestive of a seizure.   He was not incontinent of urine or feces. We will place patient on a cardiac monitor to rule out arrhythmias as a cause of his syncope. Obtain 2D echo: LVEF 35 to 40%.  Left ventricle has decreased function.  There is global  hypokinesis. Cardiology consulted.  Patient does not have any telemetry changes or rhythm disturbance consistent with episode of syncope.   Recommended Linq device tentatively scheduled on Wednesday.  Subarachnoid bleed, traumatic Traumatic and following a fall Neurosurgery was consulted by the ED He also recommends repeat imaging in 6 hours Hold Plavix and anticoagulants. Repeat CT head shows stable subarachnoid hemorrhage. Hold Plavix for 7 days, hold DVT prophylaxis for 48 hours.  Follow-up in clinic in 2 to 3 weeks.  Acute encephalopathy that could be due to [facility]: Patient remains confused since arrival. UA: unremarkable, vitals are stable. Most likely could be due to subarachnoid hemorrhage. No source of infection found.  Labs unremarkable.  Seizure disorder;  Chronic and stable. Continue Keppra 500 mg twice a day Continue seizure precautions  History of CVA with left-sided weakness Continue metoprolol and statins. 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 Patient underwent hemodialysis 1/24..  Depression Continue trazodone and Lexapro  Hypothyroidism Continue Synthroid  Hypomagnesemia Supplement magnesium  Frequent falls Patient's wife is concerned about frequent falls at home with increased risk for fractures. Patient will need PT evaluation once stable and may benefit from subacute rehab.   Chronic sinusitis Patient had a CT scan of the head and cervical spine which showed chronic sinusitis from left OMU obstruction with complicated disease at the left maxillary sinus with chronic defect in the posterior wall. Discussed with ENT Dr. Richardson Landry who recommended empiric antibiotic coverage  with Augmentin and to have patient  follow-up with Dr. Kathyrn Sheriff as an outpatient to discuss surgical management. Upon discharge patient will need appointment to follow-up with Dr. Kathyrn Sheriff      DVT prophylaxis: SCDs Code Status: DNR Family Communication: No family at bedside.  Spoke with wife on phone Disposition Plan:  Status is: Inpatient  Remains inpatient appropriate because:Inpatient level of care appropriate due to severity of illness   Dispo: The patient is from: Home              Anticipated d/c is to: SNF              Anticipated d/c date is: > 3 days              Patient currently is not medically stable to d/c.   Difficult to place patient No    Consultants:   Neurosurgery/cardiology/nephrology  Procedures:  Antimicrobials:  Anti-infectives (From admission, onward)   Start     Dose/Rate Route Frequency Ordered Stop   03/21/20 1800  amoxicillin-clavulanate (AUGMENTIN) 500-125 MG per tablet 500 mg        1 tablet Oral Every 24 hours 03/20/20 1416     03/20/20 1300  amoxicillin-clavulanate (AUGMENTIN) 875-125 MG per tablet 1 tablet  Status:  Discontinued        1 tablet Oral Every 24 hours 03/20/20 1213 03/20/20 1416      Subjective: Patient was seen and examined at bedside.  Overnight events noted.   Patient seems confused, seems to disoriented, not following commands.   Objective: Vitals:   03/22/20 0100 03/22/20 0357 03/22/20 0837 03/22/20 1231  BP:  (!) 178/82 (!) 188/90 (!) 181/83  Pulse:  77 76 77  Resp:  18 19   Temp:  98.3 F (36.8 C) 98.2 F (36.8 C) 98 F (36.7 C)  TempSrc:   Oral Oral  SpO2:  100% 100% 98%  Weight: 80.7 kg     Height:        Intake/Output Summary (Last 24 hours) at 03/22/2020 1550 Last data filed at 03/22/2020 1345 Gross per 24 hour  Intake 720 ml  Output --  Net 720 ml   Filed Weights   03/20/20 0705 03/21/20 1728 03/22/20 0100  Weight: 79.8 kg 80.1 kg 80.7 kg    Examination:  General exam: Appears calm and comfortable, hematoma noted around  right eye. Respiratory system: Clear to auscultation. Respiratory effort normal. Cardiovascular system: S1 & S2 heard, RRR. No JVD, murmurs, rubs, gallops or clicks. No pedal edema. Gastrointestinal system: Abdomen is nondistended, soft and nontender. No organomegaly or masses felt. Normal bowel sounds heard. Central nervous system: Alert and confused. No focal neurological deficits. Extremities: No edema, no cyanosis no clubbing. Skin: No rashes, lesions or ulcers Psychiatry: Not assessed   Data Reviewed: I have personally reviewed following labs and imaging studies  CBC: Recent Labs  Lab 03/15/20 1653 03/16/20 0520 03/17/20 0340 03/20/20 0717 03/21/20 0340 03/22/20 0631  WBC 7.2 5.7 4.8 5.4 5.8 6.6  NEUTROABS 5.3  --   --   --   --   --   HGB 10.8* 10.1* 11.0* 10.8* 10.0* 10.4*  HCT 35.5* 31.3* 34.7* 33.4* 31.7* 32.3*  MCV 101.1* 97.5 98.0 99.4 99.1 97.9  PLT 170 144* 151 140* 142* 099*   Basic Metabolic Panel: Recent Labs  Lab 03/16/20 0520 03/16/20 1202 03/16/20 2117 03/17/20 0340 03/20/20 0717 03/21/20 0340 03/22/20 0631  NA 137  --   --  137  138 137 138  K 4.6 4.8  --  4.0 4.3 4.6 4.6  CL 97*  --   --  96* 98 96* 97*  CO2 22  --   --  28 26 23 23   GLUCOSE 105*  --   --  123* 209* 146* 115*  BUN 53*  --   --  32* 30* 37* 29*  CREATININE 9.06*  --   --  6.20* 6.78* 7.74* 6.22*  CALCIUM 6.7*  --   --  7.8* 7.0* 7.3* 8.3*  MG  --   --   --   --   --   --  1.5*  PHOS  --   --  4.8*  --   --   --  5.0*   GFR: Estimated Creatinine Clearance: 12 mL/min (A) (by C-G formula based on SCr of 6.22 mg/dL (H)). Liver Function Tests: Recent Labs  Lab 03/17/20 0340  AST 35  ALT 30  ALKPHOS 110  BILITOT 1.0  PROT 6.8  ALBUMIN 3.4*   No results for input(s): LIPASE, AMYLASE in the last 168 hours. No results for input(s): AMMONIA in the last 168 hours. Coagulation Profile: Recent Labs  Lab 03/20/20 0904  INR 1.1   Cardiac Enzymes: Recent Labs  Lab  03/17/20 0340  CKTOTAL 95   BNP (last 3 results) No results for input(s): PROBNP in the last 8760 hours. HbA1C: No results for input(s): HGBA1C in the last 72 hours. CBG: Recent Labs  Lab 03/21/20 1732 03/21/20 2039 03/22/20 0359 03/22/20 0854 03/22/20 1236  GLUCAP 109* 117* 106* 90 150*   Lipid Profile: No results for input(s): CHOL, HDL, LDLCALC, TRIG, CHOLHDL, LDLDIRECT in the last 72 hours. Thyroid Function Tests: No results for input(s): TSH, T4TOTAL, FREET4, T3FREE, THYROIDAB in the last 72 hours. Anemia Panel: No results for input(s): VITAMINB12, FOLATE, FERRITIN, TIBC, IRON, RETICCTPCT in the last 72 hours. Sepsis Labs: No results for input(s): PROCALCITON, LATICACIDVEN in the last 168 hours.  Recent Results (from the past 240 hour(s))  SARS CORONAVIRUS 2 (TAT 6-24 HRS) Nasopharyngeal Nasopharyngeal Swab     Status: None   Collection Time: 03/15/20 10:41 PM   Specimen: Nasopharyngeal Swab  Result Value Ref Range Status   SARS Coronavirus 2 NEGATIVE NEGATIVE Final    Comment: (NOTE) SARS-CoV-2 target nucleic acids are NOT DETECTED.  The SARS-CoV-2 RNA is generally detectable in upper and lower respiratory specimens during the acute phase of infection. Negative results do not preclude SARS-CoV-2 infection, do not rule out co-infections with other pathogens, and should not be used as the sole basis for treatment or other patient management decisions. Negative results must be combined with clinical observations, patient history, and epidemiological information. The expected result is Negative.  Fact Sheet for Patients: SugarRoll.be  Fact Sheet for Healthcare Providers: https://www.woods-mathews.com/  This test is not yet approved or cleared by the [place] FDA and  has been authorized for detection and/or diagnosis of SARS-CoV-2 by FDA under an Emergency Use Authorization (EUA). This EUA will remain  in effect  (meaning this test can be used) for the duration of the COVID-19 declaration under Se ction 564(b)(1) of the Act, 21 U.S.C. section 360bbb-3(b)(1), unless the authorization is terminated or revoked sooner.  Performed at [facility], [place] [address] [place], [place] [address]   SARS Coronavirus 2 by RT PCR (hospital order, performed in [facility] hospital lab) Nasopharyngeal Nasopharyngeal Swab     Status: None   Collection Time:  03/20/20  9:04 AM   Specimen: Nasopharyngeal Swab  Result Value Ref Range Status   SARS Coronavirus 2 NEGATIVE NEGATIVE Final    Comment: (NOTE) SARS-CoV-2 target nucleic acids are NOT DETECTED.  The SARS-CoV-2 RNA is generally detectable in upper and lower respiratory specimens during the acute phase of infection. The lowest concentration of SARS-CoV-2 viral copies this assay can detect is 250 copies / mL. A negative result does not preclude SARS-CoV-2 infection and should not be used as the sole basis for treatment or other patient management decisions.  A negative result may occur with improper specimen collection / handling, submission of specimen other than nasopharyngeal swab, presence of viral mutation(s) within the areas targeted by this assay, and inadequate number of viral copies (<250 copies / mL). A negative result must be combined with clinical observations, patient history, and epidemiological information.  Fact Sheet for Patients:   StrictlyIdeas.no  Fact Sheet for Healthcare Providers: BankingDealers.co.za  This test is not yet approved or  cleared by the [place] FDA and has been authorized for detection and/or diagnosis of SARS-CoV-2 by FDA under an Emergency Use Authorization (EUA).  This EUA will remain in effect (meaning this test can be used) for the duration of the COVID-19 declaration under Section 564(b)(1) of the Act, 21 U.S.C. section 360bbb-3(b)(1), unless the  authorization is terminated or revoked sooner.  Performed at [facility], [address] [place], [place] [address]      Radiology Studies: CT HEAD WO CONTRAST  Result Date: 03/20/2020 CLINICAL DATA:  Initial evaluation for acute mental status change. Worsening. EXAM: CT HEAD WITHOUT CONTRAST TECHNIQUE: Contiguous axial images were obtained from the base of the skull through the vertex without intravenous contrast. COMPARISON:  Prior head CT from earlier the same day. FINDINGS: Brain: Posttraumatic subarachnoid hemorrhage involving the peripheral left frontotemporal convexity again seen, not significantly changed in overall appearance and volume from previous. Suspected superimposed hemorrhagic contusion also similar. Overlying subdural hematoma also not significantly changed, measuring up to 5-6 mm in maximal thickness. Trace 2 mm left-to-right shift, unchanged. No hydrocephalus or ventricular trapping. Basilar cisterns remain patent. No new intracranial hemorrhage. No acute large vessel territory infarct. Underlying atrophy with chronic small vessel ischemic disease again noted. No appreciable mass lesion. Vascular: No asymmetric hyperdense vessel. Calcified atherosclerosis present at the skull base. Skull: Evolving right frontoparietal scalp contusion. Calvarium intact. Sinuses/Orbits: Globes and orbital soft tissues demonstrate no acute finding. Chronic left frontoethmoidal and maxillary sinusitis again noted. Mastoids remain clear. Other: None. IMPRESSION: 1. No significant interval change in left frontotemporal convexity subarachnoid hemorrhage with probable superimposed hemorrhagic contusion. Overlying extra-axial hemorrhage also not significantly changed, likely subdural. Trace 2 mm left-to-right shift, unchanged. No hydrocephalus or ventricular trapping. 2. No other new acute intracranial abnormality. 3. Evolving right frontoparietal scalp contusion. No calvarial fracture. 4. Chronic  left frontoethmoidal and maxillary sinusitis. Electronically Signed   By: Jeannine Boga M.D.   On: 03/20/2020 20:20   ECHOCARDIOGRAM COMPLETE  Result Date: 03/22/2020    ECHOCARDIOGRAM REPORT   Patient Name:   WILBON OBENCHAIN Cotrell Date of Exam: 03/21/2020 Medical Rec #:  [id number]          Height:       72.0 in Accession #:    [id number]         Weight:       176.0 lb Date of Birth:  20-Jul-1950          BSA:  2.018 m Patient Age:    49 years           BP:           181/83 mmHg Patient Gender: M                  HR:           81 bpm. Exam Location:  [facility] Procedure: 2D Echo, Cardiac Doppler and Color Doppler Indications:     Syncope 780.2 / R55  History:         Patient has prior history of Echocardiogram examinations.                  Stroke; Risk Factors:Hypertension.  Sonographer:     Alyse Low Roar Referring Phys:  Floyd Diagnosing Phys: Bartholome Bill MD IMPRESSIONS  1. Left ventricular ejection fraction, by estimation, is 35 to 40%. The left ventricle has moderately decreased function. The left ventricle demonstrates global hypokinesis. The left ventricular internal cavity size was mildly dilated. There is mild left ventricular hypertrophy. Left ventricular diastolic parameters were normal.  2. Right ventricular systolic function is mildly reduced. The right ventricular size is moderately enlarged.  3. Left atrial size was mild to moderately dilated.  4. Right atrial size was mildly dilated.  5. The mitral valve is grossly normal. Mild mitral valve regurgitation.  6. The aortic valve is grossly normal. Aortic valve regurgitation is not visualized. FINDINGS  Left Ventricle: Left ventricular ejection fraction, by estimation, is 35 to 40%. The left ventricle has moderately decreased function. The left ventricle demonstrates global hypokinesis. The left ventricular internal cavity size was mildly dilated. There is mild left ventricular hypertrophy. Left ventricular diastolic parameters  were normal. Right Ventricle: The right ventricular size is moderately enlarged. No increase in right ventricular wall thickness. Right ventricular systolic function is mildly reduced. Left Atrium: Left atrial size was mild to moderately dilated. Right Atrium: Right atrial size was mildly dilated. Pericardium: There is no evidence of pericardial effusion. Mitral Valve: The mitral valve is grossly normal. Mild mitral valve regurgitation. Tricuspid Valve: The tricuspid valve is grossly normal. Tricuspid valve regurgitation is mild. Aortic Valve: The aortic valve is grossly normal. Aortic valve regurgitation is not visualized. Aortic valve peak gradient measures 4.6 mmHg. Pulmonic Valve: The pulmonic valve was not well visualized. Pulmonic valve regurgitation is trivial. Aorta: The aortic root is normal in size and structure. IAS/Shunts: No atrial level shunt detected by color flow Doppler.  LEFT VENTRICLE PLAX 2D LVIDd:         5.37 cm      Diastology LVIDs:         4.42 cm      LV e' medial:   4.35 cm/s LV PW:         1.19 cm      LV E/e' medial: 23.0 LV IVS:        1.14 cm LVOT diam:     1.90 cm LVOT Area:     2.84 cm  LV Volumes (MOD) LV vol d, MOD A2C: 151.0 ml LV vol d, MOD A4C: 132.0 ml LV vol s, MOD A2C: 97.5 ml LV vol s, MOD A4C: 81.6 ml LV SV MOD A2C:     53.5 ml LV SV MOD A4C:     132.0 ml LV SV MOD BP:      50.5 ml RIGHT VENTRICLE RV Mid diam:    4.75 cm RV S prime:  7.07 cm/s TAPSE (M-mode): 1.0 cm LEFT ATRIUM              Index       RIGHT ATRIUM           Index LA diam:        4.80 cm  2.38 cm/m  RA Area:     31.10 cm LA Vol (A2C):   116.0 ml 57.49 ml/m RA Volume:   114.00 ml 56.49 ml/m LA Vol (A4C):   107.0 ml 53.03 ml/m LA Biplane Vol: 112.0 ml 55.50 ml/m  AORTIC VALVE                PULMONIC VALVE AV Area (Vmax): 1.73 cm    RVOT Peak grad: 1 mmHg AV Vmax:        107.00 cm/s AV Peak Grad:   4.6 mmHg LVOT Vmax:      65.10 cm/s  AORTA Ao Root diam: 3.40 cm MITRAL VALVE                TRICUSPID  VALVE MV Area (PHT): 4.29 cm     TR Peak grad:   37.2 mmHg MV Decel Time: 177 msec     TR Vmax:        305.00 cm/s MV E velocity: 100.00 cm/s                             SHUNTS                             Systemic Diam: 1.90 cm Bartholome Bill MD Electronically signed by Bartholome Bill MD Signature Date/Time: 03/22/2020/7:42:50 AM    Final     Scheduled Meds: . amoxicillin-clavulanate  1 tablet Oral Q24H  . atorvastatin  10 mg Oral Daily  . b complex-vitamin c-folic acid  1 tablet Oral Daily  . calcium acetate  1,334 mg Oral TID WC  . epoetin (EPOGEN/PROCRIT) injection  4,000 Units Intravenous Q M,W,F-HD  . gabapentin  100 mg Oral BID  . insulin aspart  0-6 Units Subcutaneous TID WC  . levETIRAcetam  500 mg Oral BID  . levothyroxine  200 mcg Oral QAC breakfast  . lidocaine-prilocaine  1 application Topical Q T,Th,Sa-HD  . lipase/protease/amylase  24,000 Units Oral TID WC  . metoprolol succinate  50 mg Oral Daily  . pantoprazole  40 mg Oral Daily  . sodium chloride flush  3 mL Intravenous Q12H  . sodium chloride flush  3 mL Intravenous Q12H  . traZODone  100 mg Oral QHS   Continuous Infusions:   LOS: 0 days    Time spent: 25 mins    Shawna Clamp, MD [facility]   If 7PM-7AM, please contact night-coverage

## 2020-03-22 NOTE — Evaluation (Signed)
Occupational Therapy Evaluation Patient Details Name: Kyle [name] MRN: [id number] DOB: 1950/11/08 Today's Date: 03/22/2020    History of Present Illness Patient is a 70 year old male with PMH of ESRD on dialysis MWF, CVA with left side weakness , chronic diastolic heart failure, COPD, PAD, OSA, history of GI bleed, insulin-dependent diabetes, hyperlipidemia presents to the ER following syncopal episode and fall.  Patient has developed huge hematoma over his right eye after he was found on the floor. CT head and cervical spine shows small volume subarachnoid hemorrhage and possible subdural hemorrhage, along the superficial temporal lobe.   Clinical Impression   Pt was seen for OT evaluation this date. Pt back after recent admission and discharging. No family/caregivers present. Pt presents with significant receptive and expressive deficits, and inability to verbalize well during session. Pt demo's impairments in cognition, requiring cues for sequencing, initiation, and pt demo'd significant difficulty with open ended and multiple choice questions, follows simple commands inconsistently, requires increased cues to initiate, sequence, able to correctly identify how to use object (comb) but verbally unable to state what it is. Unsafe to attempt transfer as pt unable to follow with 1 step commands for bed mobility to get EOB. Instead, tolerated rolling with cues in order for OT to provide MAX A for bed level pericare after BM. Pt would benefit from skilled OT services to address noted impairments and functional limitations (see below for any additional details) in order to maximize safety and independence while minimizing falls risk and caregiver burden. Upon hospital discharge, recommend STR to maximize pt safety and return to PLOF.     Follow Up Recommendations  SNF;Supervision/Assistance - 24 hour    Equipment Recommendations  None recommended by OT    Recommendations for Other Services        Precautions / Restrictions Precautions Precautions: Fall Precaution Comments: dizziness Restrictions Weight Bearing Restrictions: No      Mobility Bed Mobility Overal bed mobility: Needs Assistance Bed Mobility: Rolling Rolling: Supervision   Supine to sit: Min assist Sit to supine: Mod assist   General bed mobility comments: cues to initiate    Transfers Overall transfer level: Needs assistance Equipment used: None Transfers: Sit to/from Stand Sit to Stand: Mod assist         General transfer comment: patient attempts multiple times to stand unsucessfully. with maximal cues for technique, patient able to stand with lifting and lowering assistance provided during transfer    Balance Overall balance assessment: Needs assistance Sitting-balance support: Feet supported Sitting balance-Leahy Scale: Fair Sitting balance - Comments: close stand by assistance provided for safety with no gross loss of balance   Standing balance support: Single extremity supported Standing balance-Leahy Scale: Poor Standing balance comment: Min A - Mod A provided to maintain standing balance with standing tolerance of less than one minute                           ADL either performed or assessed with clinical judgement   ADL Overall ADL's : Needs assistance/impaired                           Toilet Transfer Details (indicate cue type and reason): Max A for bed level pericare 2/2 decr cognition, safety for standing attempts                 Vision   Additional Comments: pt visually tracks  OT in room, initially able to correctly state that he sees 2 fingers but later asked during quadrant testing and pt unable to verbalize well; unable to formally test 2/2 cognition     Perception     Praxis      Pertinent Vitals/Pain Pain Assessment: Faces Faces Pain Scale: No hurt     Hand Dominance     Extremity/Trunk Assessment Upper Extremity  Assessment Upper Extremity Assessment: Generalized weakness;Difficult to assess due to impaired cognition   Lower Extremity Assessment Lower Extremity Assessment: Generalized weakness;Difficult to assess due to impaired cognition       Communication Communication Communication: Receptive difficulties;Expressive difficulties   Cognition Arousal/Alertness: Awake/alert Behavior During Therapy: Flat affect Overall Cognitive Status: No family/caregiver present to determine baseline cognitive functioning                                 General Comments: pt demo'd significant difficulty with open ended and multiple choice questions, follows simple commands inconsistently, requires increased cues to initiate, sequence, able to correctly identify how to use object (comb) but verbally unable to state what it is   General Comments       Exercises     Shoulder Instructions      Home Living Family/patient expects to be discharged to:: Private residence Living Arrangements: Spouse/significant other                               Additional Comments: information gathered through the chart. patient was unable to provide information at this time due to communication deficits      Prior Functioning/Environment          Comments: per chart review, patient ambulatory with rolling walker prior to admission with history of falls        OT Problem List: Decreased strength;Decreased cognition;Impaired balance (sitting and/or standing);Decreased knowledge of use of DME or AE      OT Treatment/Interventions: Self-care/ADL training;Therapeutic exercise;Patient/family education;Balance training;Energy conservation;Therapeutic activities;DME and/or AE instruction    OT Goals(Current goals can be found in the care plan section) Acute Rehab OT Goals Patient Stated Goal: patient unable to state OT Goal Formulation: With patient Time For Goal Achievement:  04/05/20 Potential to Achieve Goals: Good ADL Goals Pt Will Perform Lower Body Dressing: with min assist;sit to/from stand Pt Will Transfer to Toilet: with min guard assist;ambulating;bedside commode (LRAD for amb) Additional ADL Goal #1: Pt will follow simple 1-2 step commands with MIN VC for safety/sequencing during simple ADL Tasks. Additional ADL Goal #2: Pt will perform bed mobility with supervision in anticipation of seated EOB ADL Tasks and ADL mobility.  OT Frequency: Min 1X/week   Barriers to D/C:            Co-evaluation              AM-PAC OT "6 Clicks" Daily Activity     Outcome Measure Help from another person eating meals?: A Little Help from another person taking care of personal grooming?: A Little Help from another person toileting, which includes using toliet, bedpan, or urinal?: A Lot Help from another person bathing (including washing, rinsing, drying)?: A Lot Help from another person to put on and taking off regular upper body clothing?: A Little Help from another person to put on and taking off regular lower body clothing?: A Lot 6 Click Score: 15  End of Session Nurse Communication: Mobility status  Activity Tolerance: Patient tolerated treatment well Patient left: in bed;with call bell/phone within reach;with bed alarm set  OT Visit Diagnosis: Repeated falls (R29.6);Muscle weakness (generalized) (M62.81);History of falling (Z91.81);Other abnormalities of gait and mobility (R26.89);Other symptoms and signs involving cognitive function                Time: 1001-1023 OT Time Calculation (min): 22 min Charges:  OT General Charges $OT Visit: 1 Visit OT Evaluation $OT Eval Moderate Complexity: 1 Mod OT Treatments $Self Care/Home Management : 8-22 mins  Jeni Salles, MPH, MS, OTR/L ascom [phone] 03/22/20, 12:55 PM

## 2020-03-23 ENCOUNTER — Encounter
Admission: EM | Disposition: A | Discharge status: Hospice | Payer: Self-pay | Source: Home / Self Care | Attending: Family Medicine

## 2020-03-23 ENCOUNTER — Inpatient Hospital Stay: Payer: Medicare PPO

## 2020-03-23 ENCOUNTER — Encounter: Payer: Self-pay | Admitting: Internal Medicine

## 2020-03-23 DIAGNOSIS — I5032 Chronic diastolic (congestive) heart failure: Secondary | ICD-10-CM

## 2020-03-23 DIAGNOSIS — Z515 Encounter for palliative care: Secondary | ICD-10-CM

## 2020-03-23 DIAGNOSIS — W19XXXA Unspecified fall, initial encounter: Secondary | ICD-10-CM

## 2020-03-23 DIAGNOSIS — J329 Chronic sinusitis, unspecified: Secondary | ICD-10-CM | POA: Diagnosis not present

## 2020-03-23 DIAGNOSIS — J42 Unspecified chronic bronchitis: Secondary | ICD-10-CM

## 2020-03-23 LAB — RENAL FUNCTION PANEL
Albumin: 3.3 g/dL — ABNORMAL LOW (ref 3.5–5.0)
Anion gap: 15 (ref 5–15)
BUN: 43 mg/dL — ABNORMAL HIGH (ref 8–23)
CO2: 25 mmol/L (ref 22–32)
Calcium: 8.1 mg/dL — ABNORMAL LOW (ref 8.9–10.3)
Chloride: 94 mmol/L — ABNORMAL LOW (ref 98–111)
Creatinine, Ser: 7.8 mg/dL — ABNORMAL HIGH (ref 0.61–1.24)
GFR, Estimated: 7 mL/min — ABNORMAL LOW (ref >60)
Glucose, Bld: 144 mg/dL — ABNORMAL HIGH (ref 70–99)
Phosphorus: 5.5 mg/dL — ABNORMAL HIGH (ref 2.5–4.6)
Potassium: 4.9 mmol/L (ref 3.5–5.1)
Sodium: 134 mmol/L — ABNORMAL LOW (ref 135–145)

## 2020-03-23 LAB — CBC
HCT: 32.6 % — ABNORMAL LOW (ref 39.0–52.0)
Hemoglobin: 10.3 g/dL — ABNORMAL LOW (ref 13.0–17.0)
MCH: 30.7 pg (ref 26.0–34.0)
MCHC: 31.6 g/dL (ref 30.0–36.0)
MCV: 97.3 fL (ref 80.0–100.0)
Platelets: 129 10*3/uL — ABNORMAL LOW (ref 150–400)
RBC: 3.35 MIL/uL — ABNORMAL LOW (ref 4.22–5.81)
RDW: 14.1 % (ref 11.5–15.5)
WBC: 6.2 10*3/uL (ref 4.0–10.5)
nRBC: 0 % (ref 0.0–0.2)

## 2020-03-23 LAB — GLUCOSE, CAPILLARY
Glucose-Capillary: 145 mg/dL — ABNORMAL HIGH (ref 70–99)
Glucose-Capillary: 139 mg/dL — ABNORMAL HIGH (ref 70–99)
Glucose-Capillary: 93 mg/dL (ref 70–99)
Glucose-Capillary: 148 mg/dL — ABNORMAL HIGH (ref 70–99)
Glucose-Capillary: 194 mg/dL — ABNORMAL HIGH (ref 70–99)

## 2020-03-23 SURGERY — LOOP RECORDER INSERTION
Anesthesia: LOCAL

## 2020-03-23 MED ORDER — CHLORHEXIDINE GLUCONATE CLOTH 2 % EX PADS
6.0000 | MEDICATED_PAD | Freq: Every day | CUTANEOUS | Status: DC
  Administered 2020-03-23 – 2020-03-29 (×6): 6 via TOPICAL

## 2020-03-23 MED ORDER — LIDOCAINE-EPINEPHRINE (PF) 1 %-1:200000 IJ SOLN
INTRAMUSCULAR | Status: AC
  Filled 2020-03-23: qty 10

## 2020-03-23 NOTE — Progress Notes (Signed)
PT Cancellation Note  Patient Details Name: Kyle [name] MRN: [id number] DOB: April 05, 1950   Cancelled Treatment:    Reason Eval/Treat Not Completed: Patient declined, no reason specified;Other (comment) (attempted to see patient at 1815. Patient confused but seemed willing to participate. BP 175/71 mmHg and HR 70 bpm at rest. Patient saw BP on machine and became alarmed, asked to be reclined flatter so PT did so. When attempting to mobilize, patient found to have large BM so PT requested assistance in clean up from nurse tech. NT and nursing student arrived with ample hands to assist patient in pericare/clean up. PT left room to see another patient and re-attempt at later time when he is clean. Second attempt at [place]. Patient reclining in bed and repeatedly asking for the light to be turned off. Was not willing to participate with PT at this time and only wanted light to be off. All attempts unsuccessful this date. Will re-attempt at later time/date as appropriate)   Everlean Alstrom. Graylon Good, PT, DPT 03/23/20, 6:54 PM

## 2020-03-23 NOTE — NC FL2 (Signed)
[place] LEVEL OF CARE SCREENING TOOL     IDENTIFICATION  Patient Name: Kyle [name] Birthdate: 08-Nov-1950 Sex: male Admission Date (Current Location): 03/20/2020  [place] and [place] Number:  Engineering geologist and Address:  [facility], [address], [place], [place] [address]      Provider Number: [id number]  Attending Physician Name and Address:  Max Sane, MD  Relative Name and Phone Number:  Acea Yagi wife    Current Level of Care: Hospital Recommended Level of Care: [place] Prior Approval Number:    Date Approved/Denied:   PASRR Number: [id number]  Discharge Plan: SNF    Current Diagnoses: Patient Active Problem List   Diagnosis Date Noted  . Syncope and collapse 03/20/2020  . SAH (subarachnoid hemorrhage) ([place]) 03/20/2020  . Chronic sinusitis 03/20/2020  . Chronic diastolic CHF (congestive heart failure) ([place]) 03/16/2020  . Hyperkalemia 03/15/2020  . Seizure ([place]) 01/25/2020  . Depression   . Acute GI bleeding 07/28/2019  . Melena 07/22/2019  . History of GI bleed 07/22/2019  . Current every day smoker 06/22/2019  . Falls frequently 06/22/2019  . Palliative care by specialist   . Goals of care, counseling/discussion   . Orthostasis 05/02/2019  . ESRD (end stage renal disease) on dialysis ([place])   . Acute blood loss anemia 04/30/2019  . Acute on chronic blood loss anemia 04/29/2019  . History of CVA (cerebrovascular accident) 04/29/2019  . GI bleed 04/04/2019  . 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[place]) 03/20/2019  . Generalized weakness   . Diabetic polyneuropathy associated with type 2 diabetes mellitus ([place])   . Cerebrovascular accident (CVA) ([place])   . PAD (peripheral artery disease) ([place]) 03/17/2019  . Ptosis of eyelid, left 03/15/2019  . Physical deconditioning 03/14/2019  . Hypomagnesemia 03/13/2019  . Hyperphosphatemia 03/13/2019  . ESRD (end stage renal disease) ([place]) 03/13/2019  .  Chronic kidney disease with peritoneal dialysis as preferred modality, stage 5 ([place]) 03/13/2019  . Prolonged QT interval 03/13/2019  . Hypertension associated with diabetes ([place]) 03/13/2019  . Hypothyroidism 03/13/2019  . Chronic diarrhea 03/13/2019  . Hypocalcemia 03/12/2019  . Ataxia 03/10/2019  . Dizziness 03/10/2019  . Vision problem 03/10/2019  . Hypokalemia 09/16/2018  . ESRD on peritoneal dialysis ([place]) 03/04/2018  . Lung nodule 06/12/2016  . Narrowing of airway   . Cigarette smoker 05/24/2016  . Collapse of right lung 05/24/2016  . COPD with chronic bronchitis ([place]) 05/24/2016  . Diarrhea due to malabsorption 07/01/2015  . Hx of adenomatous colonic polyps 07/01/2015  . Acne 05/20/2015  . Calculus of kidney 05/20/2015  . Chest pain, non-cardiac 05/20/2015  . Seizure disorder ([place]) 05/20/2015  . Current tobacco use 05/20/2015  . Chronic kidney disease (CKD), stage III (moderate) ([place]) 03/29/2015  . Insulin dependent type 2 diabetes mellitus ([place]) 03/15/2015  . Lipoma of shoulder 03/08/2014  . Other synovitis and tenosynovitis, right shoulder 03/08/2014  . Right supraspinatus tenosynovitis 03/08/2014  . Bursitis of elbow 02/15/2014  . Olecranon bursitis of right elbow 02/15/2014  . Absolute anemia 08/08/2013  . 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 Back pain, chronic 08/08/2013  . Chronic obstructive pulmonary disease ([place]) 08/08/2013  . BP (high blood pressure) 08/08/2013  . Hyperlipidemia associated with type 2 diabetes mellitus ([place]) 08/08/2013  . Acne erythematosa 08/08/2013    Orientation RESPIRATION BLADDER Height & Weight     Self  Normal  (On Dialysis, do not void) Weight: 80.8 kg Height:  6' (182.9 cm)  BEHAVIORAL SYMPTOMS/MOOD NEUROLOGICAL BOWEL NUTRITION STATUS        Diet  AMBULATORY STATUS COMMUNICATION OF NEEDS Skin   Extensive Assist Verbally Bruising ([place])                       Personal Care Assistance Level of Assistance   Bathing,Feeding,Dressing Bathing Assistance: Limited assistance Feeding assistance: Limited assistance Dressing Assistance: Limited assistance     Functional Limitations Info  Sight,Hearing,Speech Sight Info: Adequate Hearing Info: Adequate Speech Info: Adequate    SPECIAL CARE FACTORS FREQUENCY  PT (By licensed PT),OT (By licensed OT)     PT Frequency: 5x week OT Frequency: 5x week            Contractures Contractures Info: Not present    Additional Factors Info  Code Status,Allergies Code Status Info: DNR Allergies Info: Lacosamide           Current Medications (03/23/2020):  This is the current hospital active medication list Current Facility-Administered Medications  Medication Dose Route Frequency Provider Last Rate Last Admin  . acetaminophen (TYLENOL) tablet 650 mg  650 mg Oral Q6H PRN Agbata, Tochukwu, MD   650 mg at 03/22/20 0954   Or  . acetaminophen (TYLENOL) suppository 650 mg  650 mg Rectal Q6H PRN Agbata, Tochukwu, MD      . amoxicillin-clavulanate (AUGMENTIN) 500-125 MG per tablet 500 mg  1 tablet Oral Q24H Agbata, Tochukwu, MD   500 mg at 03/22/20 1801  . atorvastatin (LIPITOR) tablet 10 mg  10 mg Oral Daily Agbata, Tochukwu, MD   10 mg at 03/22/20 0954  . b complex-vitamin c-folic acid (NEPHRO-VITE) tablet 1 tablet  1 tablet Oral Daily Agbata, Tochukwu, MD   1 tablet at 03/22/20 0955  . calcium acetate (PHOSLO) capsule 1,334 mg  1,334 mg Oral TID WC Agbata, Tochukwu, MD   1,334 mg at 03/22/20 1801  . epoetin alfa (EPOGEN) injection 4,000 Units  4,000 Units Intravenous Q M,W,F-HD Lavonia Dana, MD   4,000 Units at 03/23/20 1138  . gabapentin (NEURONTIN) capsule 100 mg  100 mg Oral BID Agbata, Tochukwu, MD   100 mg at 03/22/20 2139  . insulin aspart (novoLOG) injection 0-6 Units  0-6 Units Subcutaneous TID WC Agbata, Tochukwu, MD   1 Units at 03/22/20 1801  . levETIRAcetam (KEPPRA) tablet 500 mg  500 mg Oral BID Agbata, Tochukwu, MD   500 mg at 03/22/20  2139  . levothyroxine (SYNTHROID) tablet 200 mcg  200 mcg Oral QAC breakfast Agbata, Tochukwu, MD   200 mcg at 03/22/20 0954  . lidocaine-prilocaine (EMLA) cream 1 application  1 application Topical Q T,Th,Sa-HD Agbata, Tochukwu, MD      . lipase/protease/amylase (CREON) capsule 24,000 Units  24,000 Units Oral TID WC Agbata, Tochukwu, MD   24,000 Units at 03/22/20 1801  . metoprolol succinate (TOPROL-XL) 24 hr tablet 50 mg  50 mg Oral Daily Agbata, Tochukwu, MD   50 mg at 03/22/20 0954  . pantoprazole (PROTONIX) EC tablet 40 mg  40 mg Oral Daily Agbata, Tochukwu, MD   40 mg at 03/22/20 0954  . sodium chloride flush (NS) 0.9 % injection 3 mL  3 mL Intravenous Q12H Agbata, Tochukwu, MD   3 mL at 03/21/20 2156  . sodium chloride flush (NS) 0.9 % injection 3 mL  3 mL Intravenous Q12H Corey Skains, MD   3 mL at 03/22/20 2140  . traZODone (DESYREL) tablet 100 mg  100 mg Oral QHS Agbata, Tochukwu,  MD   100 mg at 03/22/20 2139     Discharge Medications: Please see discharge summary for a list of discharge medications.  Relevant Imaging Results:  Relevant Lab Results:   Additional Information    Kerin Salen, RN

## 2020-03-23 NOTE — Progress Notes (Signed)
PROGRESS NOTE    Kyle [name]  GUR:[id number] DOB: [birth year] DOA: 03/20/2020 PCP: Idelle Crouch, MD   Brief Narrative:  This 70 years old male with PMH significant for ESRD on dialysis MWF, CVA, chronic diastolic heart failure, COPD, PAD, OSA, history of GI bleed, insulin-dependent diabetes, hyperlipidemia presents to the ER for the evaluation of fall following syncopal episode.  Patient has developed huge hematoma over his right eye after he was found on the floor.  Patient does not remember how did this happen. CT head and cervical spine shows small volume subarachnoid hemorrhage and possible subdural hemorrhage, along the superficial temporal lobe.  No significant mass-effect. Of note patient was recently discharged from hospital about 3 days ago where he was evaluated for GI bleed.   Patient is admitted for possible syncopal episode, Neurosurgery, Cardiology and Nephrology consults were called.  Patient underwent hemodialysis on 1/24.  Renal status is stable.  Cardiology is recommending Linq device tomorrow for detection of heart arrhythmias causing syncope.  Neurosurgery recommended no intervention.  1/26: patient in some pain but manageable. Wife requests to repeat CT head which is ordered for today. Palliative care c/s per wife request. Waiting for SNF   Assessment & Plan:   Principal Problem:   Syncope and collapse Active Problems:   Chronic obstructive pulmonary disease (HCC)   Seizure disorder (HCC)   Hypothyroidism   ESRD (end stage renal disease) on dialysis ([place])   Falls frequently   Depression   Chronic diastolic CHF (congestive heart failure) (HCC)   SAH (subarachnoid hemorrhage) (HCC)   Chronic sinusitis  Syncope and collapse: Patient was found on the floor by his wife following a fall. Patient does not remember the events and wife did not see any jerking motions suggestive of a seizure.   He was not incontinent of urine or feces. We will place patient on a  cardiac monitor to rule out arrhythmias as a cause of his syncope. Obtain 2D echo: LVEF 35 to 40%.  Left ventricle has decreased function.  There is global hypokinesis. Cardiology consulted.  Patient does not have any telemetry changes or rhythm disturbance consistent with episode of syncope.   Recommended Linq device tentatively scheduled on Wednesday.  Subarachnoid bleed, traumatic Traumatic and following a fall Neurosurgery seen and recommends hold Plavix and anticoagulants. Repeat CT head shows stable subarachnoid hemorrhage. Wife concerned and request repeat CT head so I've ordered for today Hold Plavix for 7 days, hold DVT prophylaxis for 48 hours.  Follow-up in clinic in 2 to 3 weeks.  Acute encephalopathy that could be due to [facility]: Patient remains confused since arrival. UA: unremarkable, vitals are stable. Most likely could be due to subarachnoid hemorrhage. No source of infection found.  Labs unremarkable.  Seizure disorder;  Chronic and stable. Continue Keppra 500 mg twice a day Continue seizure precautions  History of CVA with left-sided weakness Continue metoprolol and statins. 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 Patient underwent hemodialysis 1/24. Further HD per nephro  Depression Continue trazodone and Lexapro  Hypothyroidism Continue Synthroid  Hypomagnesemia Supplement magnesium  Frequent falls Patient's wife is concerned about frequent falls at home with increased risk for fractures. 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 Discussed with ENT Dr. Richardson Landry who recommended empiric antibiotic coverage with Augmentin and to have patient follow-up with Dr. Kathyrn Sheriff as an outpatient to discuss surgical management. Upon discharge patient will need appointment to follow-up with Dr. Kathyrn Sheriff    Palliative care c/s requested on 1/26 for GOC - wife agreeable  DVT prophylaxis: SCDs Code Status: DNR Family Communication: No family at bedside.  Spoke with wife on phone on 1/26 Disposition Plan:  Status is: Inpatient  Remains inpatient appropriate because:Inpatient level of care appropriate due to severity of illness   Dispo: The patient is from: Home              Anticipated d/c is to: SNF              Anticipated d/c date is: > 3 days              Patient currently is not medically stable to d/c.waiting for placement   Difficult to place patient No    Consultants:   Neurosurgery/cardiology/nephrology  Procedures:  Antimicrobials:  Anti-infectives (From admission, onward)   Start     Dose/Rate Route Frequency Ordered Stop   03/21/20 1800  amoxicillin-clavulanate (AUGMENTIN) 500-125 MG per tablet 500 mg        1 tablet Oral Every 24 hours 03/20/20 1416     03/20/20 1300  amoxicillin-clavulanate (AUGMENTIN) 875-125 MG per tablet 1 tablet  Status:  Discontinued        1 tablet Oral Every 24 hours 03/20/20 1213 03/20/20 1416      Subjective: Remains intermittently confused, seems to disoriented, not following commands. In some pain but manageable   Objective: Vitals:   03/22/20 1231 03/22/20 1947 03/23/20 0408 03/23/20 0738  BP: (!) 181/83 (!) 169/84 (!) 165/79 (!) 155/83  Pulse: 77 76 71 70  Resp:  18 18 18   Temp: 98 F (36.7 C) 98 F (36.7 C) 98.1 F (36.7 C) (!) 97.5 F (36.4 C)  TempSrc: Oral   Oral  SpO2: 98% 99% 99% 99%  Weight:   80.8 kg   Height:        Intake/Output Summary (Last 24 hours) at 03/23/2020 1117 Last data filed at 03/22/2020 2200 Gross per 24 hour  Intake  810 ml  Output -  Net 810 ml   Filed Weights   03/21/20 1728 03/22/20 0100 03/23/20 0408  Weight: 80.1 kg 80.7 kg 80.8 kg    Examination:  General exam: Appears calm and comfortable, hematoma noted around right eye. Respiratory system: Clear to auscultation. Respiratory effort normal. Cardiovascular system: S1 & S2 heard, RRR. No JVD, murmurs, rubs, gallops or clicks. No pedal edema. Gastrointestinal system: Abdomen is nondistended, soft and nontender. No organomegaly or masses felt. Normal bowel sounds heard. Central nervous system: Alert and confused. No focal neurological deficits. Extremities: No edema, no cyanosis no clubbing. Skin: No rashes, lesions or ulcers Psychiatry: Not assessed   Data Reviewed: I have personally reviewed following labs and imaging studies  CBC: Recent Labs  Lab 03/17/20 0340 03/20/20 0717 03/21/20 0340 03/22/20 0631 03/23/20 0929  WBC 4.8 5.4 5.8 6.6 6.2  HGB 11.0* 10.8* 10.0* 10.4* 10.3*  HCT 34.7* 33.4* 31.7* 32.3* 32.6*  MCV 98.0 99.4 99.1 97.9 97.3  PLT 151 140* 142* 149* 932*   Basic Metabolic Panel: Recent Labs  Lab 03/16/20 2117 03/17/20 0340 03/20/20 0717 03/21/20 0340 03/22/20 0631 03/23/20 0929  NA  --  137 138 137 138 134*  K  --  4.0 4.3 4.6 4.6 4.9  CL  --  96* 98 96* 97* 94*  CO2  --  28 26 23 23 25   GLUCOSE  --  123* 209* 146* 115* 144*  BUN  --  32* 30* 37* 29* 43*  CREATININE  --  6.20* 6.78* 7.74* 6.22* 7.80*  CALCIUM  --  7.8* 7.0* 7.3* 8.3* 8.1*  MG  --   --   --   --  1.5*  --   PHOS 4.8*  --   --   --  5.0* 5.5*   GFR: Estimated Creatinine Clearance: 9.5 mL/min (A) (by C-G formula based on SCr of 7.8 mg/dL (H)). Liver Function Tests: Recent Labs  Lab 03/17/20 0340 03/23/20 0929  AST 35  --   ALT 30  --   ALKPHOS 110  --   BILITOT 1.0  --   PROT 6.8  --   ALBUMIN 3.4* 3.3*   No results for input(s): LIPASE, AMYLASE in the last 168 hours. No results for input(s): AMMONIA in the last 168  hours. Coagulation Profile: Recent Labs  Lab 03/20/20 0904  INR 1.1   Cardiac Enzymes: Recent Labs  Lab 03/17/20 0340  CKTOTAL 95   BNP (last 3 results) No results for input(s): PROBNP in the last 8760 hours. HbA1C: No results for input(s): HGBA1C in the last 72 hours. CBG: Recent Labs  Lab 03/22/20 1236 03/22/20 1656 03/22/20 1946 03/23/20 0409 03/23/20 0740  GLUCAP 150* 154* 179* 145* 139*   Lipid Profile: No results for input(s): CHOL, HDL, LDLCALC, TRIG, CHOLHDL, LDLDIRECT in the last 72 hours. Thyroid Function Tests: No results for input(s): TSH, T4TOTAL, FREET4, T3FREE, THYROIDAB in the last 72 hours. Anemia Panel: No results for input(s): VITAMINB12, FOLATE, FERRITIN, TIBC, IRON, RETICCTPCT in the last 72 hours. Sepsis Labs: No results for input(s): PROCALCITON, LATICACIDVEN in the last 168 hours.  Recent Results (from the past 240 hour(s))  SARS CORONAVIRUS 2 (TAT 6-24 HRS) Nasopharyngeal Nasopharyngeal Swab     Status: None   Collection Time: 03/15/20 10:41 PM   Specimen: Nasopharyngeal Swab  Result Value Ref Range Status   SARS Coronavirus 2 NEGATIVE NEGATIVE Final    Comment: (NOTE) SARS-CoV-2 target nucleic acids are NOT DETECTED.  The SARS-CoV-2 RNA is generally detectable in upper and lower respiratory specimens during the acute phase of infection. Negative results do not preclude SARS-CoV-2 infection, do not rule out co-infections with other pathogens, and should not be used as the sole basis for treatment or other patient management decisions. Negative results must be combined with clinical observations, patient history, and epidemiological information. The expected result is Negative.  Fact Sheet for Patients: SugarRoll.be  Fact Sheet for Healthcare Providers: https://www.woods-mathews.com/  This test is not yet approved or cleared by the [place] FDA and  has been authorized for detection  and/or diagnosis of SARS-CoV-2 by FDA under an Emergency Use Authorization (EUA). This EUA will remain  in effect (meaning this test can be used) for the duration of the COVID-19 declaration under Se ction 564(b)(1) of the Act, 21 U.S.C. section 360bbb-3(b)(1), unless the authorization is terminated or revoked sooner.  Performed at [facility], [place] [address], [place], [place] [address]   SARS Coronavirus 2 by RT PCR (hospital order, performed in [facility] hospital lab) Nasopharyngeal Nasopharyngeal Swab     Status: None   Collection Time: 03/20/20  9:04 AM   Specimen:  Nasopharyngeal Swab  Result Value Ref Range Status   SARS Coronavirus 2 NEGATIVE NEGATIVE Final    Comment: (NOTE) SARS-CoV-2 target nucleic acids are NOT DETECTED.  The SARS-CoV-2 RNA is generally detectable in upper and lower respiratory specimens during the acute phase of infection. The lowest concentration of SARS-CoV-2 viral copies this assay can detect is 250 copies / mL. A negative result does not preclude SARS-CoV-2 infection and should not be used as the sole basis for treatment or other patient management decisions.  A negative result may occur with improper specimen collection / handling, submission of specimen other than nasopharyngeal swab, presence of viral mutation(s) within the areas targeted by this assay, and inadequate number of viral copies (<250 copies / mL). A negative result must be combined with clinical observations, patient history, and epidemiological information.  Fact Sheet for Patients:   StrictlyIdeas.no  Fact Sheet for Healthcare Providers: BankingDealers.co.za  This test is not yet approved or  cleared by the [place] FDA and has been authorized for detection and/or diagnosis of SARS-CoV-2 by FDA under an Emergency Use Authorization (EUA).  This EUA will remain in effect (meaning this test can be used) for the duration of  the COVID-19 declaration under Section 564(b)(1) of the Act, 21 U.S.C. section 360bbb-3(b)(1), unless the authorization is terminated or revoked sooner.  Performed at [facility], [address], [place],  [address]      Radiology Studies: ECHOCARDIOGRAM COMPLETE  Result Date: 03/22/2020    ECHOCARDIOGRAM REPORT   Patient Name:   IHOR MEINZER Hammes Date of Exam: 03/21/2020 Medical Rec #:  [id number]          Height:       72.0 in Accession #:    [id number]         Weight:       176.0 lb Date of Birth:  1950-08-23          BSA:          2.018 m Patient Age:    43 years           BP:           181/83 mmHg Patient Gender: M                  HR:           81 bpm. Exam Location:  [facility] Procedure: 2D Echo, Cardiac Doppler and Color Doppler Indications:     Syncope 780.2 / R55  History:         Patient has prior history of Echocardiogram examinations.                  Stroke; Risk Factors:Hypertension.  Sonographer:     Alyse Low Roar Referring Phys:  [place] Diagnosing Phys: Bartholome Bill MD IMPRESSIONS  1. Left ventricular ejection fraction, by estimation, is 35 to 40%. The left ventricle has moderately decreased function. The left ventricle demonstrates global hypokinesis. The left ventricular internal cavity size was mildly dilated. There is mild left ventricular hypertrophy. Left ventricular diastolic parameters were normal.  2. Right ventricular systolic function is mildly reduced. The right ventricular size is moderately enlarged.  3. Left atrial size was mild to moderately dilated.  4. Right atrial size was mildly dilated.  5. The mitral valve is grossly normal. Mild mitral valve regurgitation.  6. The aortic valve is grossly normal. Aortic valve regurgitation is not visualized. FINDINGS  Left Ventricle: Left ventricular ejection fraction, by  estimation, is 35 to 40%. The left ventricle has moderately decreased function. The left ventricle demonstrates global hypokinesis. The  left ventricular internal cavity size was mildly dilated. There is mild left ventricular hypertrophy. Left ventricular diastolic parameters were normal. Right Ventricle: The right ventricular size is moderately enlarged. No increase in right ventricular wall thickness. Right ventricular systolic function is mildly reduced. Left Atrium: Left atrial size was mild to moderately dilated. Right Atrium: Right atrial size was mildly dilated. Pericardium: There is no evidence of pericardial effusion. Mitral Valve: The mitral valve is grossly normal. Mild mitral valve regurgitation. Tricuspid Valve: The tricuspid valve is grossly normal. Tricuspid valve regurgitation is mild. Aortic Valve: The aortic valve is grossly normal. Aortic valve regurgitation is not visualized. Aortic valve peak gradient measures 4.6 mmHg. Pulmonic Valve: The pulmonic valve was not well visualized. Pulmonic valve regurgitation is trivial. Aorta: The aortic root is normal in size and structure. IAS/Shunts: No atrial level shunt detected by color flow Doppler.  LEFT VENTRICLE PLAX 2D LVIDd:         5.37 cm      Diastology LVIDs:         4.42 cm      LV e' medial:   4.35 cm/s LV PW:         1.19 cm      LV E/e' medial: 23.0 LV IVS:        1.14 cm LVOT diam:     1.90 cm LVOT Area:     2.84 cm  LV Volumes (MOD) LV vol d, MOD A2C: 151.0 ml LV vol d, MOD A4C: 132.0 ml LV vol s, MOD A2C: 97.5 ml LV vol s, MOD A4C: 81.6 ml LV SV MOD A2C:     53.5 ml LV SV MOD A4C:     132.0 ml LV SV MOD BP:      50.5 ml RIGHT VENTRICLE RV Mid diam:    4.75 cm RV S prime:     7.07 cm/s TAPSE (M-mode): 1.0 cm LEFT ATRIUM              Index       RIGHT ATRIUM           Index LA diam:        4.80 cm  2.38 cm/m  RA Area:     31.10 cm LA Vol (A2C):   116.0 ml 57.49 ml/m RA Volume:   114.00 ml 56.49 ml/m LA Vol (A4C):   107.0 ml 53.03 ml/m LA Biplane Vol: 112.0 ml 55.50 ml/m  AORTIC VALVE                PULMONIC VALVE AV Area (Vmax): 1.73 cm    RVOT Peak grad: 1 mmHg AV  Vmax:        107.00 cm/s AV Peak Grad:   4.6 mmHg LVOT Vmax:      65.10 cm/s  AORTA Ao Root diam: 3.40 cm MITRAL VALVE                TRICUSPID VALVE MV Area (PHT): 4.29 cm     TR Peak grad:   37.2 mmHg MV Decel Time: 177 msec     TR Vmax:        305.00 cm/s MV E velocity: 100.00 cm/s                             SHUNTS  Systemic Diam: 1.90 cm Bartholome Bill MD Electronically signed by Bartholome Bill MD Signature Date/Time: 03/22/2020/7:42:50 AM    Final     Scheduled Meds: . amoxicillin-clavulanate  1 tablet Oral Q24H  . atorvastatin  10 mg Oral Daily  . b complex-vitamin c-folic acid  1 tablet Oral Daily  . calcium acetate  1,334 mg Oral TID WC  . epoetin (EPOGEN/PROCRIT) injection  4,000 Units Intravenous Q M,W,F-HD  . gabapentin  100 mg Oral BID  . insulin aspart  0-6 Units Subcutaneous TID WC  . levETIRAcetam  500 mg Oral BID  . levothyroxine  200 mcg Oral QAC breakfast  . lidocaine-prilocaine  1 application Topical Q T,Th,Sa-HD  . lipase/protease/amylase  24,000 Units Oral TID WC  . metoprolol succinate  50 mg Oral Daily  . pantoprazole  40 mg Oral Daily  . sodium chloride flush  3 mL Intravenous Q12H  . sodium chloride flush  3 mL Intravenous Q12H  . traZODone  100 mg Oral QHS   Continuous Infusions:   LOS: 1 day    Time spent: 25 mins    Max Sane, MD [facility]   If 7PM-7AM, please contact night-coverage

## 2020-03-23 NOTE — Progress Notes (Signed)
[facility]  ROUNDING NOTE   Subjective:     Seen and examined on hemodialysis treatment. Tolerating treatment well.     HEMODIALYSIS FLOWSHEET:  Blood Flow Rate (mL/min): 200 mL/min Arterial Pressure (mmHg): -80 mmHg Venous Pressure (mmHg): 70 mmHg Transmembrane Pressure (mmHg): 20 mmHg Ultrafiltration Rate (mL/min): 70 mL/min Dialysate Flow Rate (mL/min): 600 ml/min Conductivity: Machine : 14 Conductivity: Machine : 14 Dialysis Fluid Bolus: Normal Saline Bolus Amount (mL): 250 mL    Objective:  Vital signs in last 24 hours:  Temp:  [97.5 F (36.4 C)-98.9 F (37.2 C)] 98.5 F (36.9 C) (01/26 1351) Pulse Rate:  [63-76] 74 (01/26 1351) Resp:  [13-19] 19 (01/26 1351) BP: (155-173)/(65-84) 173/74 (01/26 1351) SpO2:  [98 %-99 %] 98 % (01/26 1351) Weight:  [80.8 kg] 80.8 kg (01/26 0408)  Weight change: 0.771 kg Filed Weights   03/21/20 1728 03/22/20 0100 03/23/20 0408  Weight: 80.1 kg 80.7 kg 80.8 kg    Intake/Output: I/O last 3 completed shifts: In: 1290 [P.O.:1240; IV Piggyback:50] Out: -    Intake/Output this shift:  Total I/O In: -  Out: 1500 [Other:1500]  Physical Exam: General: NAD  Head: Right ecchymosis+. Moist oral mucosal membranes  Eyes: + large ecchymosis around right eye   Neck: Supple, trachea midline  Lungs:  Clear to auscultation  Heart: Regular rate and rhythm  Abdomen:  Soft, nontender,   Extremities:  No peripheral edema.  Neurologic: Nonfocal, moving all four extremities  Skin: abrasions on his right arm   Access: Left upper extremity AVF, +thrill      Basic Metabolic Panel: Recent Labs  Lab 03/16/20 2117 03/17/20 0340 03/17/20 0340 03/20/20 0717 03/21/20 0340 03/22/20 0631 03/23/20 0929  NA  --  137  --  138 137 138 134*  K  --  4.0  --  4.3 4.6 4.6 4.9  CL  --  96*  --  98 96* 97* 94*  CO2  --  28  --  26 23 23 25   GLUCOSE  --  123*  --  209* 146* 115* 144*  BUN  --  32*  --  30* 37* 29* 43*  CREATININE   --  6.20*  --  6.78* 7.74* 6.22* 7.80*  CALCIUM  --  7.8*   < > 7.0* 7.3* 8.3* 8.1*  MG  --   --   --   --   --  1.5*  --   PHOS 4.8*  --   --   --   --  5.0* 5.5*   < > = values in this interval not displayed.    Liver Function Tests: Recent Labs  Lab 03/17/20 0340 03/23/20 0929  AST 35  --   ALT 30  --   ALKPHOS 110  --   BILITOT 1.0  --   PROT 6.8  --   ALBUMIN 3.4* 3.3*   No results for input(s): LIPASE, AMYLASE in the last 168 hours. No results for input(s): AMMONIA in the last 168 hours.  CBC: Recent Labs  Lab 03/17/20 0340 03/20/20 0717 03/21/20 0340 03/22/20 0631 03/23/20 0929  WBC 4.8 5.4 5.8 6.6 6.2  HGB 11.0* 10.8* 10.0* 10.4* 10.3*  HCT 34.7* 33.4* 31.7* 32.3* 32.6*  MCV 98.0 99.4 99.1 97.9 97.3  PLT 151 140* 142* 149* 129*    Cardiac Enzymes: Recent Labs  Lab 03/17/20 0340  CKTOTAL 95    BNP: Invalid input(s): POCBNP  CBG: Recent Labs  Lab 03/22/20 1656  03/22/20 1946 03/23/20 0409 03/23/20 0740 03/23/20 1401  GLUCAP 154* 179* 145* 139* 14    Microbiology: Results for orders placed or performed during the hospital encounter of 03/20/20  SARS Coronavirus 2 by RT PCR (hospital order, performed in [facility] hospital lab) Nasopharyngeal Nasopharyngeal Swab     Status: None   Collection Time: 03/20/20  9:04 AM   Specimen: Nasopharyngeal Swab  Result Value Ref Range Status   SARS Coronavirus 2 NEGATIVE NEGATIVE Final    Comment: (NOTE) SARS-CoV-2 target nucleic acids are NOT DETECTED.  The SARS-CoV-2 RNA is generally detectable in upper and lower respiratory specimens during the acute phase of infection. The lowest concentration of SARS-CoV-2 viral copies this assay can detect is 250 copies / mL. A negative result does not preclude SARS-CoV-2 infection and should not be used as the sole basis for treatment or other patient management decisions.  A negative result may occur with improper specimen collection / handling, submission of  specimen other than nasopharyngeal swab, presence of viral mutation(s) within the areas targeted by this assay, and inadequate number of viral copies (<250 copies / mL). A negative result must be combined with clinical observations, patient history, and epidemiological information.  Fact Sheet for Patients:   StrictlyIdeas.no  Fact Sheet for Healthcare Providers: BankingDealers.co.za  This test is not yet approved or  cleared by the [place] FDA and has been authorized for detection and/or diagnosis of SARS-CoV-2 by FDA under an Emergency Use Authorization (EUA).  This EUA will remain in effect (meaning this test can be used) for the duration of the COVID-19 declaration under Section 564(b)(1) of the Act, 21 U.S.C. section 360bbb-3(b)(1), unless the authorization is terminated or revoked sooner.  Performed at [facility], [address] [place], [place] [address]     Coagulation Studies: No results for input(s): LABPROT, INR in the last 72 hours.  Urinalysis: No results for input(s): COLORURINE, LABSPEC, PHURINE, GLUCOSEU, HGBUR, BILIRUBINUR, KETONESUR, PROTEINUR, UROBILINOGEN, NITRITE, LEUKOCYTESUR in the last 72 hours.  Invalid input(s): APPERANCEUR    Imaging: ECHOCARDIOGRAM COMPLETE  Result Date: 03/22/2020    ECHOCARDIOGRAM REPORT   Patient Name:   Kyle [name] [place] Date of Exam: 03/21/2020 Medical Rec #:  [id number]          Height:       72.0 in Accession #:    [id number]         Weight:       176.0 lb Date of Birth:  [birth year]          BSA:          2.018 m Patient Age:    70 years           BP:           181/83 mmHg Patient Gender: M                  HR:           81 bpm. Exam Location:  [facility] Procedure: 2D Echo, Cardiac Doppler and Color Doppler Indications:     Syncope 780.2 / R55  History:         Patient has prior history of Echocardiogram examinations.                  Stroke; Risk Factors:Hypertension.   Sonographer:     Alyse Low Roar Referring Phys:  [place] Diagnosing Phys: Bartholome Bill MD IMPRESSIONS  1. Left ventricular ejection fraction, by estimation,  is 35 to 40%. The left ventricle has moderately decreased function. The left ventricle demonstrates global hypokinesis. The left ventricular internal cavity size was mildly dilated. There is mild left ventricular hypertrophy. Left ventricular diastolic parameters were normal.  2. Right ventricular systolic function is mildly reduced. The right ventricular size is moderately enlarged.  3. Left atrial size was mild to moderately dilated.  4. Right atrial size was mildly dilated.  5. The mitral valve is grossly normal. Mild mitral valve regurgitation.  6. The aortic valve is grossly normal. Aortic valve regurgitation is not visualized. FINDINGS  Left Ventricle: Left ventricular ejection fraction, by estimation, is 35 to 40%. The left ventricle has moderately decreased function. The left ventricle demonstrates global hypokinesis. The left ventricular internal cavity size was mildly dilated. There is mild left ventricular hypertrophy. Left ventricular diastolic parameters were normal. Right Ventricle: The right ventricular size is moderately enlarged. No increase in right ventricular wall thickness. Right ventricular systolic function is mildly reduced. Left Atrium: Left atrial size was mild to moderately dilated. Right Atrium: Right atrial size was mildly dilated. Pericardium: There is no evidence of pericardial effusion. Mitral Valve: The mitral valve is grossly normal. Mild mitral valve regurgitation. Tricuspid Valve: The tricuspid valve is grossly normal. Tricuspid valve regurgitation is mild. Aortic Valve: The aortic valve is grossly normal. Aortic valve regurgitation is not visualized. Aortic valve peak gradient measures 4.6 mmHg. Pulmonic Valve: The pulmonic valve was not well visualized. Pulmonic valve regurgitation is trivial. Aorta: The aortic  root is normal in size and structure. IAS/Shunts: No atrial level shunt detected by color flow Doppler.  LEFT VENTRICLE PLAX 2D LVIDd:         5.37 cm      Diastology LVIDs:         4.42 cm      LV e' medial:   4.35 cm/s LV PW:         1.19 cm      LV E/e' medial: 23.0 LV IVS:        1.14 cm LVOT diam:     1.90 cm LVOT Area:     2.84 cm  LV Volumes (MOD) LV vol d, MOD A2C: 151.0 ml LV vol d, MOD A4C: 132.0 ml LV vol s, MOD A2C: 97.5 ml LV vol s, MOD A4C: 81.6 ml LV SV MOD A2C:     53.5 ml LV SV MOD A4C:     132.0 ml LV SV MOD BP:      50.5 ml RIGHT VENTRICLE RV Mid diam:    4.75 cm RV S prime:     7.07 cm/s TAPSE (M-mode): 1.0 cm LEFT ATRIUM              Index       RIGHT ATRIUM           Index LA diam:        4.80 cm  2.38 cm/m  RA Area:     31.10 cm LA Vol (A2C):   116.0 ml 57.49 ml/m RA Volume:   114.00 ml 56.49 ml/m LA Vol (A4C):   107.0 ml 53.03 ml/m LA Biplane Vol: 112.0 ml 55.50 ml/m  AORTIC VALVE                PULMONIC VALVE AV Area (Vmax): 1.73 cm    RVOT Peak grad: 1 mmHg AV Vmax:        107.00 cm/s AV Peak Grad:   4.6 mmHg LVOT Vmax:  65.10 cm/s  AORTA Ao Root diam: 3.40 cm MITRAL VALVE                TRICUSPID VALVE MV Area (PHT): 4.29 cm     TR Peak grad:   37.2 mmHg MV Decel Time: 177 msec     TR Vmax:        305.00 cm/s MV E velocity: 100.00 cm/s                             SHUNTS                             Systemic Diam: 1.90 cm Bartholome Bill MD Electronically signed by Bartholome Bill MD Signature Date/Time: 03/22/2020/7:42:50 AM    Final      Medications:    . amoxicillin-clavulanate  1 tablet Oral Q24H  . atorvastatin  10 mg Oral Daily  . b complex-vitamin c-folic acid  1 tablet Oral Daily  . calcium acetate  1,334 mg Oral TID WC  . gabapentin  100 mg Oral BID  . insulin aspart  0-6 Units Subcutaneous TID WC  . levETIRAcetam  500 mg Oral BID  . levothyroxine  200 mcg Oral QAC breakfast  . lidocaine-prilocaine  1 application Topical Q T,Th,Sa-HD  . lipase/protease/amylase   24,000 Units Oral TID WC  . metoprolol succinate  50 mg Oral Daily  . pantoprazole  40 mg Oral Daily  . sodium chloride flush  3 mL Intravenous Q12H  . sodium chloride flush  3 mL Intravenous Q12H  . traZODone  100 mg Oral QHS   acetaminophen **OR** acetaminophen  Assessment/ Plan:  [name] is a 70 y.o.  male  past medical history of CVA with residual left-sided weakness, ESRD on HD MWF, seizure disorder, diabetes mellitus, hypertension, anemia of chronic kidney disease, secondary hyperparathyroidism who was admitted post fall. Found to have subarachnoid hemorrhage. Syncope work up negative so far.  [facility] MWF [address] Left AVF 79.5kg  1.  ESRD on HD MWF. Seen and examined on hemodialysis treatment  2.  Anemia of chronic kidney disease: hemoglobin 10.4 - EPO with HD treatments  3.  Secondary hyperparathyroidism.  -  Continue calcium acetate with meals  4.  Hypertension.   - metoprolol   LOS: 1 Kyle [name] 1/26/20223:28 PM

## 2020-03-23 NOTE — Plan of Care (Signed)
Palliative: Consult received.  Attempted to see Kyle [name], but he was off the floor. PMT to follow-up 03/24/2020.  No charge Kyle Axe, NP Palliative medicine team Greater than 50% of this time was spent counseling and coordinating care related to the above assessment and plan.

## 2020-03-23 NOTE — TOC Progression Note (Signed)
Transition of Care [facility]) - Progression Note    Patient Details  Name: Elbert Spickler MRN: [id number] Date of Birth: October 17, 1950  Transition of Care [facility] CM/SW Bouse, RN Phone Number: 03/23/2020, 1:08 PM  Clinical Narrative:  Called and spoke with Wife, who voices preference for patient to go to SNF for Rehab. Services prior to coming home. I will began the search process and keep wife informed. Will continue to monitor for discharge needs.     Expected Discharge Plan: [place] Barriers to Discharge: Continued Medical Work up,Family Issues,SNF Pending bed offer  Expected Discharge Plan and Services Expected Discharge Plan: [place] In-house Referral: Clinical Social Work   Post Acute Care Choice: [place] Living arrangements for the past 2 months: Single Family Home                                       Social Determinants of Health (SDOH) Interventions    Readmission Risk Interventions Readmission Risk Prevention Plan 01/26/2020  Transportation Screening Complete  Medication Review Press photographer) Complete  PCP or Specialist appointment within 3-5 days of discharge Complete  HRI or Home Care Consult Complete  SW Recovery Care/Counseling Consult Complete  Palliative Care Screening Not [place] Complete  Some recent data might be hidden

## 2020-03-23 NOTE — Consult Note (Addendum)
Consultation Note Date: 03/23/2020   Patient Name: Kyle [name]  DOB: Dec 28, 1950  MRN: [id number]  Age / Sex: 70 y.o., male  PCP: Kyle Crouch, MD Referring Physician: Max Sane, MD  Reason for Consultation: Establishing goals of care and Psychosocial/spiritual support  HPI/Patient Profile: 70 y.o. male  with past medical history of ESRDon dialysis MWD, CVA, chronic dHF, COPD, PAD, OSA, history of GI bleed,IDDM2, HTN admitted on 03/20/2020 with Syncope and collapse.   Clinical Assessment and Goals of Care: I have reviewed medical records including EPIC notes, labs and images, examined the patient and met at bedside with Kyle [name] to discuss diagnosis prognosis, GOC, EOL wishes, disposition and options.  I introduced Palliative Medicine as specialized medical care for people living with serious illness. It focuses on providing relief from the symptoms and stress of a serious illness.   Kyle [name] is lying quietly in bed watching television.  He greets me making and somewhat keeping eye contact.  He appears acutely/chronically ill and frail.  He is alert and oriented to person, place, time, but does seem to have slowed thoughts/response time.  We discussed a brief life review of the patient.  He tells me that he is married, he worked as a Scientist, clinical (histocompatibility and immunogenetics) in [place].  He shares that he has been taking dialysis for a little over 1 year.  We discussed current illness and what it means in the larger context of on-going co-morbidities.  Natural disease trajectory and expectations at EOL were discussed.  We talked about time for outcomes.  Strength training with short-term rehab.  At this point Kyle [name] is agreeable to STR.    Advanced directives, concepts specific to code status, artifical feeding and hydration, and rehospitalization were considered and discussed.  Kyle [name] completed a MOST form  in March 2021.  We review his choices.  They remain unchanged.   Call to wife, Kyle [name].  We talked about his falls.  Kyle [name] shares that [place] falls frequently.  We talked about head CT results.  We talked about cardiology visit, no further work-up at this time.  Kyle [name] shares that she would prefer him going to short-term rehab.  She talks about her worries about continued falls, frailty, weakness.    We talked about outpatient palliative, talking about the "what if's and maybe's".  Kyle [name] shares her concerns about continued declines for [employer].  We talked end-of-life issues, when the time comes, and about the concept of "let nature take its course".  I encouraged her to share her concern with outpatient palliative provider.  Palliative Care services outpatient were explained and offered.  Both Mr. and Kyle [name] are agreeable to outpatient palliative services  Questions and concerns were addressed.  The family was encouraged to call with questions or concerns.   Conference with attending, bedside nursing staff, transition of care team related to patient condition, needs, goals of care.  HCPOA   NEXT OF KIN - wife, Kyle [name].   Kyle [name] shares that she and Kyle [name] have  no children.      SUMMARY OF RECOMMENDATIONS   continue to treat the treatable but no CPR or intubation agreeable to short-term rehab outpatient palliative services to follow   Code Status/Advance Care Planning:  DNR  Symptom Management:   Per hospitalist, no additional needs at this time.  Palliative Prophylaxis:   Frequent Pain Assessment and Oral Care  Additional Recommendations (Limitations, Scope, Preferences):  Treat the treatable but no CPR or intubation  Psycho-social/Spiritual:   Desire for further Chaplaincy support:no  Additional Recommendations: Caregiving  Support/Resources  Prognosis:   Unable to determine, based on outcomes.  6 to 18 months would not be surprising, based on HD treatments for  approximately 1 year, 3 hospital stays and 1 ED visit in 6 months.  Complicated by age.  Discharge Planning: Agreeable to short-term rehab, then home with outpatient palliative      Primary Diagnoses: Present on Admission: . Syncope and collapse . Chronic diastolic CHF (congestive heart failure) ([place]) . Chronic obstructive pulmonary disease ([place]) . Depression . Hypothyroidism . Chronic sinusitis   I have reviewed the medical record, interviewed the patient and family, and examined the patient. The following aspects are pertinent.  Past Medical History:  Diagnosis Date  . Anemia   . Anxiety   . BPH (benign prostatic hyperplasia)   . Chronic kidney disease   . Colon polyps   . COPD (chronic obstructive pulmonary disease) ([place])   . Depression   . Diabetes mellitus without complication ([place])   . Diverticulosis of colon   . History of kidney stones   . Hyperlipidemia   . Hypertension   . Hypothyroidism   . Nephrolithiasis   . Seizures ([place])    2015 last seizure  . Stroke [facility])    Social History   Socioeconomic History  . Marital status: Married    Spouse name: Not on file  . Number of children: Not on file  . Years of education: Not on file  . Highest education level: Not on file  Occupational History  . Not on file  Tobacco Use  . Smoking status: Light Tobacco Smoker    Packs/day: 0.25    Years: 40.00    Pack years: 10.00    Types: Cigarettes    Last attempt to quit: 05/25/2016    Years since quitting: 3.8  . Smokeless tobacco: Never Used  Vaping Use  . Vaping Use: Never used  Substance and Sexual Activity  . Alcohol use: Not Currently  . Drug use: No  . Sexual activity: Not on file  Other Topics Concern  . Not on file  Social History Narrative  . Not on file   Social Determinants of Health   Financial Resource Strain: Not on file  Food Insecurity: Not on file  Transportation Needs: Not on file  Physical Activity: Not on file  Stress: Not on file   Social Connections: Not on file   Family History  Problem Relation Age of Onset  . Diabetes Mother   . Diabetes Maternal Grandmother   . Diabetes Maternal Grandfather   . Lung cancer Father   . Emphysema Paternal Grandfather    Scheduled Meds: . amoxicillin-clavulanate  1 tablet Oral Q24H  . atorvastatin  10 mg Oral Daily  . b complex-vitamin c-folic acid  1 tablet Oral Daily  . calcium acetate  1,334 mg Oral TID WC  . gabapentin  100 mg Oral BID  . insulin aspart  0-6 Units Subcutaneous TID WC  .  levETIRAcetam  500 mg Oral BID  . levothyroxine  200 mcg Oral QAC breakfast  . lidocaine-prilocaine  1 application Topical Q T,Th,Sa-HD  . lipase/protease/amylase  24,000 Units Oral TID WC  . metoprolol succinate  50 mg Oral Daily  . pantoprazole  40 mg Oral Daily  . sodium chloride flush  3 mL Intravenous Q12H  . sodium chloride flush  3 mL Intravenous Q12H  . traZODone  100 mg Oral QHS   Continuous Infusions: PRN Meds:.acetaminophen **OR** acetaminophen Medications Prior to Admission:  Prior to Admission medications   Medication Sig Start Date End Date Taking? Authorizing Provider  atorvastatin (LIPITOR) 10 MG tablet Take 10 mg by mouth daily. 01/08/20  Yes [provider]  B Complex-C-Folic Acid (NEPHRO-VITE PO) Take 1 tablet by mouth daily.   Yes [provider]  calcium acetate (PHOSLO) 667 MG capsule Take 2 capsules (1,334 mg total) by mouth 3 (three) times daily with meals. 03/20/19  Yes Wieting, Richard, MD  clopidogrel (PLAVIX) 75 MG tablet Take 75 mg by mouth daily.   Yes [provider]  escitalopram (LEXAPRO) 10 MG tablet Take 10 mg by mouth daily. 07/14/19  Yes [provider]  gabapentin (NEURONTIN) 100 MG capsule Take 1 capsule (100 mg total) by mouth 2 (two) times daily. 01/28/20  Yes Pokhrel, Laxman, MD  levETIRAcetam (KEPPRA) 500 MG tablet Take 500 mg by mouth 2 (two) times daily. 03/09/20  Yes [provider]  levothyroxine  (SYNTHROID) 200 MCG tablet Take 200 mcg by mouth daily before breakfast.  02/25/19  Yes [provider]  lidocaine-prilocaine (EMLA) cream Apply 1 application topically Every Tuesday,Thursday,and Saturday with dialysis.    Yes [provider]  metoprolol succinate (TOPROL-XL) 50 MG 24 hr tablet Take 50 mg by mouth daily. 02/22/20  Yes [provider]  Pancrelipase, Lip-Prot-Amyl, 24000-76000 units CPEP Take 1 capsule by mouth 3 (three) times daily with meals.    Yes [provider]  pantoprazole (PROTONIX) 40 MG tablet Take 40 mg by mouth daily.   Yes [provider]   Allergies  Allergen Reactions  . Vimpat [Lacosamide] Other (See Comments)    Reports causes seizures   Review of Systems  Unable to perform ROS: Other    Physical Exam Vitals and nursing note reviewed.  Constitutional:      General: He is not in acute distress.    Appearance: He is ill-appearing.  HENT:     Head: Normocephalic.     Comments: Bruising at right eye    Mouth/Throat:     Mouth: Mucous membranes are moist.  Cardiovascular:     Rate and Rhythm: Normal rate.  Pulmonary:     Effort: Pulmonary effort is normal. No respiratory distress.  Skin:    General: Skin is warm and dry.     Findings: Bruising present.  Neurological:     Mental Status: He is alert and oriented to person, place, and time.  Psychiatric:        Mood and Affect: Mood normal.        Behavior: Behavior normal.     Vital Signs: BP (!) 173/74 (BP Location: Right Arm)   Pulse 74   Temp 98.5 F (36.9 C) (Oral)   Resp 19   Ht 6' (1.829 m)   Wt 80.8 kg   SpO2 98%   BMI 24.17 kg/m  Pain Scale: 0-10   Pain Score: Asleep   SpO2: SpO2: 98 % O2 Device:SpO2: 98 % O2  Flow Rate: .   IO: Intake/output summary:   Intake/Output Summary (Last 24 hours) at 03/23/2020 1527 Last data filed at 03/23/2020 1245 Gross per 24 hour  Intake 570 ml  Output 1500 ml  Net -930 ml    LBM: Last BM  Date: 03/22/20 Baseline Weight: Weight: 79.8 kg Most recent weight: Weight: 80.8 kg     Palliative Assessment/Data:   Flowsheet Rows   Flowsheet Row Most Recent Value  Intake Tab   Referral Department Hospitalist  Unit at Time of Referral Cardiac/Telemetry Unit  Palliative Care Primary Diagnosis Trauma  Date Notified 03/23/20  Palliative Care Type Return patient Palliative Care  Reason for referral Clarify Goals of Care  Date of Admission 03/20/20  Date first seen by Palliative Care 03/23/20  # of days Palliative referral response time 0 Day(s)  # of days IP prior to Palliative referral 3  Clinical Assessment   Palliative Performance Scale Score 50%  Pain Kyle last 24 hours Not able to report  Pain Min Last 24 hours Not able to report  Dyspnea Kyle Last 24 Hours Not able to report  Dyspnea Min Last 24 hours Not able to report  Psychosocial & Spiritual Assessment   Palliative Care Outcomes       Time In: 1010 Time Out: 1120 Time Total: 70 minutes  Greater than 50%  of this time was spent counseling and coordinating care related to the above assessment and plan.  Signed by: Drue Novel, NP   Please contact Palliative Medicine Team phone at [phone] for questions and concerns.  For individual provider: See Shea Evans

## 2020-03-23 NOTE — Consult Note (Signed)
[facility] Cardiology final consultation Note  Patient ID: Kyle [name], MRN: [id number], DOB/AGE: 72/19/1952 72 y.o. Admit date: 03/20/2020   Date of Consult: 03/23/2020 Primary Physician: Idelle Crouch, MD  1/24.  Overall there has been no evidence of change in patient's condition at this time.  He still does have a significant hematoma of his right face.  The patient has not had any telemetry changes or rhythm disturbances consistent with his episode of syncope and/or fall.  He has had further evaluation from the peripheral vascular standpoint with no evidence of carotid atherosclerosis as his primary issue.  There is been no seizure by EEG with recent work-up.  There has been no evidence of congestive heart failure myocardial infarction or acute coronary syndrome.  We have discussed the possibility of a Linq device although I am not 100% sure he is understanding all and may have to have further evaluation by family members as well  1/25.  No significant change in condition today.  Patient's eye is less swollen.  No evidence of congestive heart failure type symptoms angina and/or acute coronary syndrome.  Telemetry shows normal sinus rhythm with no evidence of primary cause of fall and/or syncope.  1/26.  No significant change in occurrence condition at this time.  The patient is conversant this morning but it may not have as much interaction as yesterday.  The patient has remained in normal sinus rhythm throughout his entire hospitalization.  As stated above there is no primary cause of potential syncope and therefore may be secondary to patient just falling.  Linq device was suggested and recommended although patient's wife does not appear that she thinks is necessary at this time.  Other treatment options in the future can be less invasive as in placing Holter monitor and or event monitor on the surface.  No further apparent cardiac issues today Past Medical History:  Diagnosis Date   . Anemia   . Anxiety   . BPH (benign prostatic hyperplasia)   . Chronic kidney disease   . Colon polyps   . COPD (chronic obstructive pulmonary disease) ([place])   . Depression   . Diabetes mellitus without complication ([place])   . Diverticulosis of colon   . History of kidney stones   . Hyperlipidemia   . Hypertension   . Hypothyroidism   . Nephrolithiasis   . Seizures ([place])    2015 last seizure  . Stroke [facility])       Surgical History:  Past Surgical History:  Procedure Laterality Date  . AV FISTULA PLACEMENT Left 03/30/2019   Procedure: right brachiocephalic fistula creation;  Surgeon: Marty Heck, MD;  Location: [place];  Service: Vascular;  Laterality: Left;  . BIOPSY  04/05/2019   Procedure: BIOPSY;  Surgeon: Wilford Corner, MD;  Location: [place];  Service: Endoscopy;;  . COLONOSCOPY    . COLONOSCOPY WITH PROPOFOL N/A 05/30/2015   Procedure: COLONOSCOPY WITH PROPOFOL;  Surgeon: Manya Silvas, MD;  Location: [facility] ENDOSCOPY;  Service: Endoscopy;  Laterality: N/A;  . COLONOSCOPY WITH PROPOFOL N/A 12/01/2018   Procedure: COLONOSCOPY WITH PROPOFOL;  Surgeon: Toledo, [name], MD;  Location: [facility] ENDOSCOPY;  Service: Gastroenterology;  Laterality: N/A;  . COLONOSCOPY WITH PROPOFOL N/A 04/09/2019   Procedure: COLONOSCOPY WITH PROPOFOL;  Surgeon: Carol Ada, MD;  Location: [place];  Service: Endoscopy;  Laterality: N/A;  . ENTEROSCOPY N/A 05/05/2019   Procedure: ENTEROSCOPY;  Surgeon: Ronnette Juniper, MD;  Location: [facility] ENDOSCOPY;  Service: Gastroenterology;  Laterality:  N/A;  Push enteroscopy  . ENTEROSCOPY N/A 07/28/2019   Procedure: ENTEROSCOPY;  Surgeon: Virgel Manifold, MD;  Location: [facility] ENDOSCOPY;  Service: Endoscopy;  Laterality: N/A;  . ESOPHAGOGASTRODUODENOSCOPY N/A 03/17/2020   Procedure: ESOPHAGOGASTRODUODENOSCOPY (EGD);  Surgeon: Lesly Rubenstein, MD;  Location: [facility] ENDOSCOPY;  Service: Endoscopy;  Laterality: N/A;  . ESOPHAGOGASTRODUODENOSCOPY (EGD)  WITH PROPOFOL N/A 04/05/2019   Procedure: ESOPHAGOGASTRODUODENOSCOPY (EGD) WITH PROPOFOL;  Surgeon: Wilford Corner, MD;  Location: [place];  Service: Endoscopy;  Laterality: N/A;  . GIVENS CAPSULE STUDY N/A 05/01/2019   Procedure: GIVENS CAPSULE STUDY;  Surgeon: Ronald Lobo, MD;  Location: [place];  Service: Endoscopy;  Laterality: N/A;  . HOT HEMOSTASIS N/A 04/09/2019   Procedure: HOT HEMOSTASIS (ARGON PLASMA COAGULATION/BICAP);  Surgeon: Carol Ada, MD;  Location: [place];  Service: Endoscopy;  Laterality: N/A;  . HOT HEMOSTASIS N/A 05/05/2019   Procedure: HOT HEMOSTASIS (ARGON PLASMA COAGULATION/BICAP);  Surgeon: Ronnette Juniper, MD;  Location: [place];  Service: Gastroenterology;  Laterality: N/A;  . INSERTION OF DIALYSIS CATHETER Right 03/30/2019   Procedure: Ultrasound guided right internal jugular tunneled dialysis catheter placement;  Surgeon: Marty Heck, MD;  Location: [facility] OR;  Service: Vascular;  Laterality: Right;  . kidney stone    . REMOVAL OF A DIALYSIS CATHETER Right 06/26/2019   Procedure: REMOVAL OF A DIALYSIS CATHETER ( PD CATH REMOVAL AND I J CATH REMOVAL;  Surgeon: Katha Cabal, MD;  Location: [facility] ORS;  Service: Vascular;  Laterality: Right;  . THYROIDECTOMY    . VARICOCELE EXCISION    . VIDEO BRONCHOSCOPY Bilateral 05/25/2016   Procedure: VIDEO BRONCHOSCOPY WITHOUT FLUORO;  Surgeon: Juanito Doom, MD;  Location: [facility] ENDOSCOPY;  Service: Cardiopulmonary;  Laterality: Bilateral;     Home Meds: Prior to Admission medications   Medication Sig Start Date End Date Taking? Authorizing Provider  atorvastatin (LIPITOR) 10 MG tablet Take 10 mg by mouth daily. 01/08/20  Yes [provider]  B Complex-C-Folic Acid (NEPHRO-VITE PO) Take 1 tablet by mouth daily.   Yes [provider]  calcium acetate (PHOSLO) 667 MG capsule Take 2 capsules (1,334 mg total) by mouth 3 (three) times daily with meals. 03/20/19  Yes Wieting, Richard, MD   clopidogrel (PLAVIX) 75 MG tablet Take 75 mg by mouth daily.   Yes [provider]  escitalopram (LEXAPRO) 10 MG tablet Take 10 mg by mouth daily. 07/14/19  Yes [provider]  gabapentin (NEURONTIN) 100 MG capsule Take 1 capsule (100 mg total) by mouth 2 (two) times daily. 01/28/20  Yes Pokhrel, Laxman, MD  levETIRAcetam (KEPPRA) 500 MG tablet Take 500 mg by mouth 2 (two) times daily. 03/09/20  Yes [provider]  levothyroxine (SYNTHROID) 200 MCG tablet Take 200 mcg by mouth daily before breakfast.  02/25/19  Yes [provider]  lidocaine-prilocaine (EMLA) cream Apply 1 application topically Every Tuesday,Thursday,and Saturday with dialysis.    Yes [provider]  metoprolol succinate (TOPROL-XL) 50 MG 24 hr tablet Take 50 mg by mouth daily. 02/22/20  Yes [provider]  Pancrelipase, Lip-Prot-Amyl, 24000-76000 units CPEP Take 1 capsule by mouth 3 (three) times daily with meals.    Yes [provider]  pantoprazole (PROTONIX) 40 MG tablet Take 40 mg by mouth daily.   Yes [provider]    Inpatient Medications:  . amoxicillin-clavulanate  1 tablet Oral Q24H  . atorvastatin  10 mg Oral Daily  . b complex-vitamin c-folic acid  1 tablet Oral Daily  .  calcium acetate  1,334 mg Oral TID WC  . epoetin (EPOGEN/PROCRIT) injection  4,000 Units Intravenous Q M,W,F-HD  . gabapentin  100 mg Oral BID  . insulin aspart  0-6 Units Subcutaneous TID WC  . levETIRAcetam  500 mg Oral BID  . levothyroxine  200 mcg Oral QAC breakfast  . lidocaine-prilocaine  1 application Topical Q T,Th,Sa-HD  . lipase/protease/amylase  24,000 Units Oral TID WC  . metoprolol succinate  50 mg Oral Daily  . pantoprazole  40 mg Oral Daily  . sodium chloride flush  3 mL Intravenous Q12H  . sodium chloride flush  3 mL Intravenous Q12H  . traZODone  100 mg Oral QHS     Allergies:  Allergies  Allergen Reactions  . Vimpat [Lacosamide] Other (See  Comments)    Reports causes seizures    Social History   Socioeconomic History  . Marital status: Married    Spouse name: Not on file  . Number of children: Not on file  . Years of education: Not on file  . Highest education level: Not on file  Occupational History  . Not on file  Tobacco Use  . Smoking status: Light Tobacco Smoker    Packs/day: 0.25    Years: 40.00    Pack years: 10.00    Types: Cigarettes    Last attempt to quit: 05/25/2016    Years since quitting: 3.8  . Smokeless tobacco: Never Used  Vaping Use  . Vaping Use: Never used  Substance and Sexual Activity  . Alcohol use: Not Currently  . Drug use: No  . Sexual activity: Not on file  Other Topics Concern  . Not on file  Social History Narrative  . Not on file   Social Determinants of Health   Financial Resource Strain: Not on file  Food Insecurity: Not on file  Transportation Needs: Not on file  Physical Activity: Not on file  Stress: Not on file  Social Connections: Not on file  Intimate Partner Violence: Not on file     Family History  Problem Relation Age of Onset  . Diabetes Mother   . Diabetes Maternal Grandmother   . Diabetes Maternal Grandfather   . Lung cancer Father   . Emphysema Paternal Grandfather      Review of Systems Positive for none Negative for: General:  chills, fever, night sweats or weight changes.  Cardiovascular: PND orthopnea syncope dizziness  Dermatological skin lesions rashes Respiratory: Cough congestion Urologic: Frequent urination urination at night and hematuria Abdominal: negative for nausea, vomiting, diarrhea, bright red blood per rectum, melena, or hematemesis Neurologic: negative for visual changes, and/or hearing changes  All other systems reviewed and are otherwise negative except as noted above.  Labs: No results for input(s): CKTOTAL, CKMB, TROPONINI in the last 72 hours. Lab Results  Component Value Date   WBC 6.6 03/22/2020   HGB 10.4 (L)  03/22/2020   HCT 32.3 (L) 03/22/2020   MCV 97.9 03/22/2020   PLT 149 (L) 03/22/2020    Recent Labs  Lab 03/17/20 0340 03/20/20 0717 03/22/20 0631  NA 137   < > 138  K 4.0   < > 4.6  CL 96*   < > 97*  CO2 28   < > 23  BUN 32*   < > 29*  CREATININE 6.20*   < > 6.22*  CALCIUM 7.8*   < > 8.3*  PROT 6.8  --   --   BILITOT 1.0  --   --  ALKPHOS 110  --   --   ALT 30  --   --   AST 35  --   --   GLUCOSE 123*   < > 115*   < > = values in this interval not displayed.   Lab Results  Component Value Date   CHOL 130 03/16/2019   HDL 41 03/16/2019   LDLCALC 48 03/16/2019   TRIG 207 (H) 03/16/2019   No results found for: DDIMER  Radiology/Studies:  CT Angio Head W or Wo Contrast  Result Date: 03/20/2020 CLINICAL DATA:  Head trauma with intracranial arterial injury suspected EXAM: CT ANGIOGRAPHY HEAD AND NECK TECHNIQUE: Multidetector CT imaging of the head and neck was performed using the standard protocol during bolus administration of intravenous contrast. Multiplanar CT image reconstructions and MIPs were obtained to evaluate the vascular anatomy. Carotid stenosis measurements (when applicable) are obtained utilizing NASCET criteria, using the distal internal carotid diameter as the denominator. CONTRAST:  104mL OMNIPAQUE IOHEXOL 350 MG/ML SOLN COMPARISON:  Head CT from earlier today. FINDINGS: CTA NECK FINDINGS Aortic arch: Atheromatous calcification.  No aneurysm or dissection Right carotid system: Atheromatous wall thickening with calcification at the bifurcation. No stenosis or ulceration. Left carotid system: Atheromatous wall thickening of the common carotid. No flow limiting stenosis or ulceration. Vertebral arteries: No proximal subclavian stenosis. There are codominant vertebral arteries that are smooth and widely patent to the dura. Skeleton: No acute finding. Other neck: Left-sided sinusitis with OMU obstructive pattern and cortical breakthrough at the posterior wall left  maxillary sinus, described on preceding head CT. Upper chest: Moderate, layering right pleural effusion. Generous mediastinal lymph nodes, likely reactive. Review of the MIP images confirms the above findings CTA HEAD FINDINGS Anterior circulation: Heavily calcified carotid siphons with up to 50% stenosis on the right. No branch occlusion, beading, aneurysm, or evidence of vascular malformation. Broad appearance at the anterior communicating artery without discrete aneurysm. Essentially azygos A2 segment. Posterior circulation: Codominant vertebral arteries. Atheromatous plaque asymmetric to the left V4 segment. The vertebral and basilar arteries are smooth and widely patent. Fetal type left PCA. Negative for aneurysm or vascular malformation Venous sinuses: Unremarkable Anatomic variants: As above Review of the MIP images confirms the above findings IMPRESSION: 1. No arterial cause/injury seen underlying the traumatic pattern subarachnoid hemorrhage. 2. Cervical and intracranial carotid atherosclerosis with up to 50% stenosis at the right cavernous ICA. 3. Layering right pleural effusion. 4. Complicated left sinusitis as previously described. Electronically Signed   By: Monte Fantasia M.D.   On: 03/20/2020 10:13   DG Chest 2 View  Result Date: 03/20/2020 CLINICAL DATA:  Assess for pleural effusion. EXAM: CHEST - 2 VIEW COMPARISON:  March 15, 2020 FINDINGS: The mediastinal contour and cardiac silhouette are stable. Heart size is enlarged. Mild increased pulmonary interstitium is identified bilaterally unchanged. There is minimal right pleural effusion. There is no left pleural effusion. The bony structures are stable. IMPRESSION: Minimal right pleural effusion. Electronically Signed   By: Abelardo Diesel M.D.   On: 03/20/2020 11:16   CT HEAD WO CONTRAST  Result Date: 03/20/2020 CLINICAL DATA:  Initial evaluation for acute mental status change. Worsening. EXAM: CT HEAD WITHOUT CONTRAST TECHNIQUE: Contiguous  axial images were obtained from the base of the skull through the vertex without intravenous contrast. COMPARISON:  Prior head CT from earlier the same day. FINDINGS: Brain: Posttraumatic subarachnoid hemorrhage involving the peripheral left frontotemporal convexity again seen, not significantly changed in overall appearance and volume from previous. Suspected  superimposed hemorrhagic contusion also similar. Overlying subdural hematoma also not significantly changed, measuring up to 5-6 mm in maximal thickness. Trace 2 mm left-to-right shift, unchanged. No hydrocephalus or ventricular trapping. Basilar cisterns remain patent. No new intracranial hemorrhage. No acute large vessel territory infarct. Underlying atrophy with chronic small vessel ischemic disease again noted. No appreciable mass lesion. Vascular: No asymmetric hyperdense vessel. Calcified atherosclerosis present at the skull base. Skull: Evolving right frontoparietal scalp contusion. Calvarium intact. Sinuses/Orbits: Globes and orbital soft tissues demonstrate no acute finding. Chronic left frontoethmoidal and maxillary sinusitis again noted. Mastoids remain clear. Other: None. IMPRESSION: 1. No significant interval change in left frontotemporal convexity subarachnoid hemorrhage with probable superimposed hemorrhagic contusion. Overlying extra-axial hemorrhage also not significantly changed, likely subdural. Trace 2 mm left-to-right shift, unchanged. No hydrocephalus or ventricular trapping. 2. No other new acute intracranial abnormality. 3. Evolving right frontoparietal scalp contusion. No calvarial fracture. 4. Chronic left frontoethmoidal and maxillary sinusitis. Electronically Signed   By: Jeannine Boga M.D.   On: 03/20/2020 20:20   CT Head Wo Contrast  Result Date: 03/20/2020 CLINICAL DATA:  Follow-up of left temporal lobe subarachnoid and possible subdural hemorrhage. EXAM: CT HEAD WITHOUT CONTRAST TECHNIQUE: Contiguous axial images were  obtained from the base of the skull through the vertex without intravenous contrast. COMPARISON:  Earlier today at 7:22 a.m. FINDINGS: 2:32 p.m. Brain: Cerebral atrophy. Mild increase in small volume subarachnoid hemorrhage adjacent the left temporal and frontal lobes, including on 23/2. Small volume subdural hemorrhage including at 4 mm on 14/2 is increased, especially superiorly. Example adjacent the left frontal lobe near the vertex on coronal image 24/4 at 3 mm. Adjacent the left parietal lobe at up to 5 mm on coronal image 43, new. No significant mass effect or midline shift. No complicating infarct. No hydrocephalus or intraventricular hemorrhage. Mild low density in the periventricular white matter likely related to small vessel disease. Vascular: Intracranial atherosclerosis. Skull: Posterior scalp soft tissue swelling is greater right than left and relatively similar. No skull fracture. Sinuses/Orbits: Normal imaged portions of the orbits and globes. Left sided sinus opacification was detailed on CT of earlier today. Other: None. IMPRESSION: 1. Mild increase in subarachnoid hemorrhage adjacent the left temporal and frontal lobes. 2. Small volume extra-axial hematoma, favoring subdural, mildly increased especially superiorly. 3.  Cerebral atrophy and small vessel ischemic change. Electronically Signed   By: Abigail Miyamoto M.D.   On: 03/20/2020 15:02   CT Head Wo Contrast  Result Date: 03/20/2020 CLINICAL DATA:  Fall with facial trauma EXAM: CT HEAD WITHOUT CONTRAST CT MAXILLOFACIAL WITHOUT CONTRAST CT CERVICAL SPINE WITHOUT CONTRAST TECHNIQUE: Multidetector CT imaging of the head, cervical spine, and maxillofacial structures were performed using the standard protocol without intravenous contrast. Multiplanar CT image reconstructions of the cervical spine and maxillofacial structures were also generated. COMPARISON:  01/25/2020 FINDINGS: CT HEAD FINDINGS Brain: High-density/acute hemorrhage in the  subarachnoid and possibly subdural spaces over the lateral left temporal lobe, in total measuring up to 3 mm in thickness. No associated mass effect. No visible parenchymal hemorrhage or swelling. Brain atrophy and chronic small vessel ischemia. No evidence of acute infarct. 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 Vascular: No hyperdense vessel or unexpected calcification. Skull: Posterior scalp swelling without calvarial fracture. CT MAXILLOFACIAL FINDINGS Osseous: No acute fracture or mandibular dislocation. Orbits: Swelling superficial to the right orbit. No visible globe or postseptal injury. Bilateral cataract resection. 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 Sinuses: Chronic  sinusitis with opacified left maxillary, anterior ethmoid, and frontal sinuses. Left maxillary wall is sclerotic and thickened with a broad dehiscence along the posterolateral wall that is traversed by partially calcified material. No sinus mass was seen on a April 07, 2009 brain MRI, but dehiscence had not been noted at that time either. Soft tissues: Hazy retro antral fat on the left, stable where covered on prior head CT 01/25/2020. CT CERVICAL SPINE FINDINGS Alignment: No traumatic malalignment. Skull base and vertebrae: Negative for cervical spine fracture. T1 and T2 superior endplate fractures with trabecular impaction, nonacute in seen on a 2021 chest CT. Soft tissues and spinal canal: No prevertebral fluid or swelling. No visible canal hematoma. Mildly enlarged lymph nodes in the upper right jugular chain, posterior to the internal jugular vein, unchanged from November 2021 and thus presumably reactive Disc levels: Multilevel degenerative facet spurring asymmetric to the right. Ordinary mid and lower cervical disc degeneration. Upper chest: Layering right pleural effusion, small where covered. Critical Value/emergent results were called by telephone at the time of  interpretation on 03/20/2020 at 8:00 am to provider Lavonia Drafts , who verbally acknowledged these results. IMPRESSION: 1. Small volume subarachnoid and possibly subdural hemorrhage along the superficial left temporal lobe. No significant mass effect. 2. Scalp and facial swelling without acute fracture. 3. Negative for cervical spine fracture. 4. 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 ENT referral is recommended if appropriate for comorbidities. Electronically Signed   By: Monte Fantasia M.D.   On: 03/20/2020 08:07   CT Angio Neck W and/or Wo Contrast  Result Date: 03/20/2020 CLINICAL DATA:  Head trauma with intracranial arterial injury suspected EXAM: CT ANGIOGRAPHY HEAD AND NECK TECHNIQUE: Multidetector CT imaging of the head and neck was performed using the standard protocol during bolus administration of intravenous contrast. Multiplanar CT image reconstructions and MIPs were obtained to evaluate the vascular anatomy. Carotid stenosis measurements (when applicable) are obtained utilizing NASCET criteria, using the distal internal carotid diameter as the denominator. CONTRAST:  63mL OMNIPAQUE IOHEXOL 350 MG/ML SOLN COMPARISON:  Head CT from earlier today. FINDINGS: CTA NECK FINDINGS Aortic arch: Atheromatous calcification.  No aneurysm or dissection Right carotid system: Atheromatous wall thickening with calcification at the bifurcation. No stenosis or ulceration. Left carotid system: Atheromatous wall thickening of the common carotid. No flow limiting stenosis or ulceration. Vertebral arteries: No proximal subclavian stenosis. There are codominant vertebral arteries that are smooth and widely patent to the dura. Skeleton: No acute finding. Other neck: Left-sided sinusitis with OMU obstructive pattern and cortical breakthrough at the  posterior wall left maxillary sinus, described on preceding head CT. Upper chest: Moderate, layering right pleural effusion. Generous mediastinal lymph nodes, likely reactive. Review of the MIP images confirms the above findings CTA HEAD FINDINGS Anterior circulation: Heavily calcified carotid siphons with up to 50% stenosis on the right. No branch occlusion, beading, aneurysm, or evidence of vascular malformation. Broad appearance at the anterior communicating artery without discrete aneurysm. Essentially azygos A2 segment. Posterior circulation: Codominant vertebral arteries. Atheromatous plaque asymmetric to the left V4 segment. The vertebral and basilar arteries are smooth and widely patent. Fetal type left PCA. Negative for aneurysm or vascular malformation Venous sinuses: Unremarkable Anatomic variants: As above Review of the MIP images confirms the above findings IMPRESSION: 1. No arterial cause/injury seen underlying the traumatic pattern subarachnoid hemorrhage.  2. Cervical and intracranial carotid atherosclerosis with up to 50% stenosis at the right cavernous ICA. 3. Layering right pleural effusion. 4. Complicated left sinusitis as previously described. Electronically Signed   By: Monte Fantasia M.D.   On: 03/20/2020 10:13   CT Cervical Spine Wo Contrast  Result Date: 03/20/2020 CLINICAL DATA:  Fall with facial trauma EXAM: CT HEAD WITHOUT CONTRAST CT MAXILLOFACIAL WITHOUT CONTRAST CT CERVICAL SPINE WITHOUT CONTRAST TECHNIQUE: Multidetector CT imaging of the head, cervical spine, and maxillofacial structures were performed using the standard protocol without intravenous contrast. Multiplanar CT image reconstructions of the cervical spine and maxillofacial structures were also generated. COMPARISON:  01/25/2020 FINDINGS: CT HEAD FINDINGS Brain: High-density/acute hemorrhage in the subarachnoid and possibly subdural spaces over the lateral left temporal lobe, in total measuring up to 3 mm in thickness.  No associated mass effect. No visible parenchymal hemorrhage or swelling. Brain atrophy and chronic small vessel ischemia. No evidence of acute infarct. 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 Vascular: No hyperdense vessel or unexpected calcification. Skull: Posterior scalp swelling without calvarial fracture. CT MAXILLOFACIAL FINDINGS Osseous: No acute fracture or mandibular dislocation. Orbits: Swelling superficial to the right orbit. No visible globe or postseptal injury. Bilateral cataract resection. 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 Sinuses: Chronic sinusitis with opacified left maxillary, anterior ethmoid, and frontal sinuses. Left maxillary wall is sclerotic and thickened with a broad dehiscence along the posterolateral wall that is traversed by partially calcified material. No sinus mass was seen on a April 07, 2009 brain MRI, but dehiscence had not been noted at that time either. Soft tissues: Hazy retro antral fat on the left, stable where covered on prior head CT 01/25/2020. CT CERVICAL SPINE FINDINGS Alignment: No traumatic malalignment. Skull base and vertebrae: Negative for cervical spine fracture. T1 and T2 superior endplate fractures with trabecular impaction, nonacute in seen on a 2021 chest CT. Soft tissues and spinal canal: No prevertebral fluid or swelling. No visible canal hematoma. Mildly enlarged lymph nodes in the upper right jugular chain, posterior to the internal jugular vein, unchanged from November 2021 and thus presumably reactive Disc levels: Multilevel degenerative facet spurring asymmetric to the right. Ordinary mid and lower cervical disc degeneration. Upper chest: Layering right pleural effusion, small where covered. Critical Value/emergent results were called by telephone at the time of interpretation on 03/20/2020 at 8:00 am to provider Lavonia Drafts , who verbally acknowledged these results. IMPRESSION: 1.  Small volume subarachnoid and possibly subdural hemorrhage along the superficial left temporal lobe. No significant mass effect. 2. Scalp and facial swelling without acute fracture. 3. Negative for cervical spine fracture. 4. 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 ENT referral is recommended if appropriate for comorbidities. Electronically Signed   By: Monte Fantasia M.D.   On: 03/20/2020 08:07   DG Chest Portable 1 View  Result Date: 03/15/2020 CLINICAL DATA:  Altered mental status. EXAM: PORTABLE CHEST 1 VIEW COMPARISON:  January 25, 2020 FINDINGS: Chronic appearing increased lung markings, without evidence of acute infiltrate, pleural effusion or pneumothorax. The cardiac silhouette is moderately enlarged. Is marked severity calcification of the aortic arch. Chronic right-sided rib fractures are seen. Multilevel degenerative changes are noted throughout the thoracic spine. IMPRESSION: Stable cardiomegaly with chronic appearing increased lung markings. Electronically Signed   By: Hoover Browns  [place] M.D.   On: 03/15/2020 20:56   ECHOCARDIOGRAM COMPLETE  Result Date: 03/22/2020    ECHOCARDIOGRAM REPORT   Patient Name:   RUDI BUNYARD Bracy Date of Exam: 03/21/2020 Medical Rec #:  [id number]          Height:       72.0 in Accession #:    [id number]         Weight:       176.0 lb Date of Birth:  13-Apr-1950          BSA:          2.018 m Patient Age:    34 years           BP:           181/83 mmHg Patient Gender: M                  HR:           81 bpm. Exam Location:  [facility] Procedure: 2D Echo, Cardiac Doppler and Color Doppler Indications:     Syncope 780.2 / R55  History:         Patient has prior history of Echocardiogram examinations.                  Stroke; Risk Factors:Hypertension.  Sonographer:     Alyse Low Roar Referring Phys:   [place] Diagnosing Phys: Bartholome Bill MD IMPRESSIONS  1. Left ventricular ejection fraction, by estimation, is 35 to 40%. The left ventricle has moderately decreased function. The left ventricle demonstrates global hypokinesis. The left ventricular internal cavity size was mildly dilated. There is mild left ventricular hypertrophy. Left ventricular diastolic parameters were normal.  2. Right ventricular systolic function is mildly reduced. The right ventricular size is moderately enlarged.  3. Left atrial size was mild to moderately dilated.  4. Right atrial size was mildly dilated.  5. The mitral valve is grossly normal. Mild mitral valve regurgitation.  6. The aortic valve is grossly normal. Aortic valve regurgitation is not visualized. FINDINGS  Left Ventricle: Left ventricular ejection fraction, by estimation, is 35 to 40%. The left ventricle has moderately decreased function. The left ventricle demonstrates global hypokinesis. The left ventricular internal cavity size was mildly dilated. There is mild left ventricular hypertrophy. Left ventricular diastolic parameters were normal. Right Ventricle: The right ventricular size is moderately enlarged. No increase in right ventricular wall thickness. Right ventricular systolic function is mildly reduced. Left Atrium: Left atrial size was mild to moderately dilated. Right Atrium: Right atrial size was mildly dilated. Pericardium: There is no evidence of pericardial effusion. Mitral Valve: The mitral valve is grossly normal. Mild mitral valve regurgitation. Tricuspid Valve: The tricuspid valve is grossly normal. Tricuspid valve regurgitation is mild. Aortic Valve: The aortic valve is grossly normal. Aortic valve regurgitation is not visualized. Aortic valve peak gradient measures 4.6 mmHg. Pulmonic Valve: The pulmonic valve was not well visualized. Pulmonic valve regurgitation is trivial. Aorta: The aortic root is normal in size and structure.  IAS/Shunts: No atrial level shunt detected by color flow Doppler.  LEFT VENTRICLE PLAX 2D LVIDd:         5.37 cm      Diastology LVIDs:         4.42 cm      LV e' medial:   4.35 cm/s LV PW:         1.19 cm      LV E/e' medial: 23.0 LV IVS:  1.14 cm LVOT diam:     1.90 cm LVOT Area:     2.84 cm  LV Volumes (MOD) LV vol d, MOD A2C: 151.0 ml LV vol d, MOD A4C: 132.0 ml LV vol s, MOD A2C: 97.5 ml LV vol s, MOD A4C: 81.6 ml LV SV MOD A2C:     53.5 ml LV SV MOD A4C:     132.0 ml LV SV MOD BP:      50.5 ml RIGHT VENTRICLE RV Mid diam:    4.75 cm RV S prime:     7.07 cm/s TAPSE (M-mode): 1.0 cm LEFT ATRIUM              Index       RIGHT ATRIUM           Index LA diam:        4.80 cm  2.38 cm/m  RA Area:     31.10 cm LA Vol (A2C):   116.0 ml 57.49 ml/m RA Volume:   114.00 ml 56.49 ml/m LA Vol (A4C):   107.0 ml 53.03 ml/m LA Biplane Vol: 112.0 ml 55.50 ml/m  AORTIC VALVE                PULMONIC VALVE AV Area (Vmax): 1.73 cm    RVOT Peak grad: 1 mmHg AV Vmax:        107.00 cm/s AV Peak Grad:   4.6 mmHg LVOT Vmax:      65.10 cm/s  AORTA Ao Root diam: 3.40 cm MITRAL VALVE                TRICUSPID VALVE MV Area (PHT): 4.29 cm     TR Peak grad:   37.2 mmHg MV Decel Time: 177 msec     TR Vmax:        305.00 cm/s MV E velocity: 100.00 cm/s                             SHUNTS                             Systemic Diam: 1.90 cm Bartholome Bill MD Electronically signed by Bartholome Bill MD Signature Date/Time: 03/22/2020/7:42:50 AM    Final    CT Maxillofacial Wo Contrast  Result Date: 03/20/2020 CLINICAL DATA:  Fall with facial trauma EXAM: CT HEAD WITHOUT CONTRAST CT MAXILLOFACIAL WITHOUT CONTRAST CT CERVICAL SPINE WITHOUT CONTRAST TECHNIQUE: Multidetector CT imaging of the head, cervical spine, and maxillofacial structures were performed using the standard protocol without intravenous contrast. Multiplanar CT image reconstructions of the cervical spine and maxillofacial structures were also generated. COMPARISON:   01/25/2020 FINDINGS: CT HEAD FINDINGS Brain: High-density/acute hemorrhage in the subarachnoid and possibly subdural spaces over the lateral left temporal lobe, in total measuring up to 3 mm in thickness. No associated mass effect. No visible parenchymal hemorrhage or swelling. Brain atrophy and chronic small vessel ischemia. No evidence of acute infarct. 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 Vascular: No hyperdense vessel or unexpected calcification. Skull: Posterior scalp swelling without calvarial fracture. CT MAXILLOFACIAL FINDINGS Osseous: No acute fracture or mandibular dislocation. Orbits: Swelling superficial to the right orbit. No visible globe or postseptal injury. Bilateral cataract resection. 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 Sinuses: Chronic sinusitis with opacified left maxillary, anterior ethmoid, and frontal sinuses. Left maxillary wall is sclerotic and  thickened with a broad dehiscence along the posterolateral wall that is traversed by partially calcified material. No sinus mass was seen on a April 07, 2009 brain MRI, but dehiscence had not been noted at that time either. Soft tissues: Hazy retro antral fat on the left, stable where covered on prior head CT 01/25/2020. CT CERVICAL SPINE FINDINGS Alignment: No traumatic malalignment. Skull base and vertebrae: Negative for cervical spine fracture. T1 and T2 superior endplate fractures with trabecular impaction, nonacute in seen on a 2021 chest CT. Soft tissues and spinal canal: No prevertebral fluid or swelling. No visible canal hematoma. Mildly enlarged lymph nodes in the upper right jugular chain, posterior to the internal jugular vein, unchanged from November 2021 and thus presumably reactive Disc levels: Multilevel degenerative facet spurring asymmetric to the right. Ordinary mid and lower cervical disc degeneration. Upper chest: Layering right pleural effusion, small where  covered. Critical Value/emergent results were called by telephone at the time of interpretation on 03/20/2020 at 8:00 am to provider Lavonia Drafts , who verbally acknowledged these results. IMPRESSION: 1. Small volume subarachnoid and possibly subdural hemorrhage along the superficial left temporal lobe. No significant mass effect. 2. Scalp and facial swelling without acute fracture. 3. Negative for cervical spine fracture. 4. 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 ENT referral is recommended if appropriate for comorbidities. Electronically Signed   By: Monte Fantasia M.D.   On: 03/20/2020 08:07    EKG: Normal sinus rhythm with left axis deviation and right bundle branch block  Weights: Filed Weights   03/21/20 1728 03/22/20 0100 03/23/20 0408  Weight: 80.1 kg 80.7 kg 80.8 kg     Physical Exam: Blood pressure (!) 155/83, pulse 70, temperature (!) 97.5 F (36.4 C), temperature source Oral, resp. rate 18, height 6' (1.829 m), weight 80.8 kg, SpO2 99 %. Body mass index is 24.17 kg/m. General: Well developed, well nourished, in no acute distress. Head eyes ears nose throat: Normocephalic, atraumatic, sclera non-icteric, no xanthomas, nares are without discharge. No apparent thyromegaly and/or mass  Lungs: Normal respiratory effort.  Few wheezes, no rales, no rhonchi.  Heart: RRR with normal S1 S2. no murmur gallop, no rub, PMI is normal size and placement, carotid upstroke normal without bruit, jugular venous pressure is normal Abdomen: Soft, non-tender, non-distended with normoactive bowel sounds. No hepatomegaly. No rebound/guarding. No obvious abdominal masses. Abdominal aorta is normal size without bruit Extremities: Trace edema. no cyanosis, no clubbing, no ulcers  Peripheral : 2+ bilateral upper extremity pulses, 2+  bilateral femoral pulses, 2+ bilateral dorsal pedal pulse Neuro: Not alert and oriented. No facial asymmetry. No focal deficit. Moves all extremities spontaneously. Musculoskeletal: Normal muscle tone without kyphosis Psych: Does not responds to questions appropriately with a normal affect.    Assessment: 70 year old male with previous cerebrovascular accident peripheral vascular disease sleep apnea diabetes chronic kidney disease stage V chronic anemia with pleural effusion having recurrent episodes of falling and injury of unknown etiology without evidence of primary source and no current evidence of congestive heart failure myocardial infarction or acute coronary syndrome.  With EEG, telemetry, and vascular work-up being negative for the potential for primary cause potential syncope the patient is left with the possibility that this is just patient falling due to unsteadiness.  Linq device has been suggested although of patient wife appears not to agree  at this time.  Therefore will look for other potential less invasive treatment options  Plan: 1.  Continue supportive care of recent injury to right face 2.  No further cardiac diagnostics at this time due to no evidence of rhythm disturbances and/or heart block by telemetry over the last several days and history suggesting the potential for falls rather than true syncope.  Other noninvasive treatment options other than Linq device include the possibility of Holter monitor and surface event monitor if needed in the future 3.  Continue treatment with dialysis without restriction 4.  Continuation of metoprolol for heart rate and blood pressure control and high intensity cholesterol therapy for further risk reduction cardiovascular event with previous history of cerebrovascular accident 5.  Please call if further questions or need for further intervention in the future  Signed, Corey Skains M.D. [facility] 03/23/2020, 8:02  AM

## 2020-03-24 ENCOUNTER — Inpatient Hospital Stay: Payer: Medicare PPO

## 2020-03-24 DIAGNOSIS — I5032 Chronic diastolic (congestive) heart failure: Secondary | ICD-10-CM | POA: Diagnosis not present

## 2020-03-24 DIAGNOSIS — I609 Nontraumatic subarachnoid hemorrhage, unspecified: Secondary | ICD-10-CM | POA: Diagnosis not present

## 2020-03-24 DIAGNOSIS — R55 Syncope and collapse: Secondary | ICD-10-CM

## 2020-03-24 DIAGNOSIS — J329 Chronic sinusitis, unspecified: Secondary | ICD-10-CM | POA: Diagnosis not present

## 2020-03-24 DIAGNOSIS — J42 Unspecified chronic bronchitis: Secondary | ICD-10-CM | POA: Diagnosis not present

## 2020-03-24 DIAGNOSIS — Z7189 Other specified counseling: Secondary | ICD-10-CM | POA: Diagnosis not present

## 2020-03-24 DIAGNOSIS — R296 Repeated falls: Secondary | ICD-10-CM | POA: Diagnosis not present

## 2020-03-24 DIAGNOSIS — G40909 Epilepsy, unspecified, not intractable, without status epilepticus: Secondary | ICD-10-CM

## 2020-03-24 DIAGNOSIS — N186 End stage renal disease: Secondary | ICD-10-CM | POA: Diagnosis not present

## 2020-03-24 LAB — GLUCOSE, CAPILLARY
Glucose-Capillary: 131 mg/dL — ABNORMAL HIGH (ref 70–99)
Glucose-Capillary: 138 mg/dL — ABNORMAL HIGH (ref 70–99)
Glucose-Capillary: 211 mg/dL — ABNORMAL HIGH (ref 70–99)
Glucose-Capillary: 154 mg/dL — ABNORMAL HIGH (ref 70–99)
Glucose-Capillary: 152 mg/dL — ABNORMAL HIGH (ref 70–99)

## 2020-03-24 NOTE — Procedures (Signed)
Patient Name: Kyle [name]  MRN: [id number]  Epilepsy Attending: Lora Havens  Referring Physician/Provider: Dr Amie [place] Date: 03/24/2020 Duration: 20.55 mins  Patient history: 70 year old man with history of strokes in seizures in the past admitted with a traumatic subarachnoid as well as traumatic hemorrhagic left temporal brain contusion with concern of mildly worse slurred speech. EEG to evaluate for seizure  Level of alertness: Awake  AEDs during EEG study: LEV, GBP  Technical aspects: This EEG study was done with scalp electrodes positioned according to the 10-20 International system of electrode placement. Electrical activity was acquired at a sampling rate of 500Hz  and reviewed with a high frequency filter of 70Hz  and a low frequency filter of 1Hz . EEG data were recorded continuously and digitally stored.   Description: The posterior dominant rhythm consists of 8 Hz activity of moderate voltage (25-35 uV) seen predominantly in posterior head regions, symmetric and reactive to eye opening and eye closing. Physiologic photic driving was not seen during photic stimulation.  Hyperventilation was not performed.     IMPRESSION: This study is within normal limits. No seizures or epileptiform discharges were seen throughout the recording.  Lorane Cousar Barbra Sarks

## 2020-03-24 NOTE — Consult Note (Signed)
Neurology Consultation  Reason for Consult: Slurred speech Referring Physician: Dr. Max Sane, hospitalist  CC: Slurred speech  History is obtained from: Chart review, patient's RN  HPI: Kyle [name] is a 70 y.o. male past medical history of strokes, seizures currently on Keppra, COPD, depression, diabetes, hypertension, hyperlipidemia, admitted to the hospital on January 23 with traumatic subarachnoid as well as a traumatic hemorrhagic brain contusion in the left temporal lobe, who was noted to have slurred speech by therapists this afternoon. There is no clear last known well.  The patient has been complaining of headache and photophobia phonophobia and has been sitting with low lights, following commands but not talking much.  Today he was seen by therapy who noticed that his speech is more slurred than what it was on prior evaluation and Dr. Manuella Ghazi asked me to evaluate the patient. The patient reports mild headache.  He knows where he is but does not know the correct month. He does not know his medical history. On asking him of his speech sounds slurred to him, he denied slurred speech.   LKW: Unknown tpa given?: no, recent subarachnoid/hemorrhagic brain contusion in the left hemisphere Premorbid modified Rankin scale (mRS):1  ROS: Unable to reliably obtain due to his mentation  Past Medical History:  Diagnosis Date  . Anemia   . Anxiety   . BPH (benign prostatic hyperplasia)   . Chronic kidney disease   . Colon polyps   . COPD (chronic obstructive pulmonary disease) ([place])   . Depression   . Diabetes mellitus without complication ([place])   . Diverticulosis of colon   . History of kidney stones   . Hyperlipidemia   . Hypertension   . Hypothyroidism   . Nephrolithiasis   . Seizures ([place])    2015 last seizure  . Stroke [facility])     Family History  Problem Relation Age of Onset  . Diabetes Mother   . Diabetes Maternal Grandmother   . Diabetes Maternal Grandfather   .  Lung cancer Father   . Emphysema Paternal Grandfather      Social History:   reports that he has been smoking cigarettes. He has a 10.00 pack-year smoking history. He has never used smokeless tobacco. He reports previous alcohol use. He reports that he does not use drugs.  Medications  Current Facility-Administered Medications:  .  acetaminophen (TYLENOL) tablet 650 mg, 650 mg, Oral, Q6H PRN, 650 mg at 03/22/20 0954 **OR** acetaminophen (TYLENOL) suppository 650 mg, 650 mg, Rectal, Q6H PRN, Agbata, Tochukwu, MD .  amoxicillin-clavulanate (AUGMENTIN) 500-125 MG per tablet 500 mg, 1 tablet, Oral, Q24H, Agbata, Tochukwu, MD, 500 mg at 03/23/20 1719 .  atorvastatin (LIPITOR) tablet 10 mg, 10 mg, Oral, Daily, Agbata, Tochukwu, MD, 10 mg at 03/24/20 0955 .  b complex-vitamin c-folic acid (NEPHRO-VITE) tablet 1 tablet, 1 tablet, Oral, Daily, Agbata, Tochukwu, MD, 1 tablet at 03/24/20 0956 .  calcium acetate (PHOSLO) capsule 1,334 mg, 1,334 mg, Oral, TID WC, Agbata, Tochukwu, MD, 1,334 mg at 03/24/20 1215 .  Chlorhexidine Gluconate Cloth 2 % PADS 6 each, 6 each, Topical, Daily, Max Sane, MD, 6 each at 03/24/20 0956 .  gabapentin (NEURONTIN) capsule 100 mg, 100 mg, Oral, BID, Agbata, Tochukwu, MD, 100 mg at 03/24/20 0956 .  insulin aspart (novoLOG) injection 0-6 Units, 0-6 Units, Subcutaneous, TID WC, Agbata, Tochukwu, MD, 2 Units at 03/24/20 1215 .  levETIRAcetam (KEPPRA) tablet 500 mg, 500 mg, Oral, BID, Agbata, Tochukwu, MD, 500 mg at 03/24/20 0955 .  levothyroxine (SYNTHROID) tablet 200 mcg, 200 mcg, Oral, QAC breakfast, Agbata, Tochukwu, MD, 200 mcg at 03/24/20 0518 .  lidocaine-prilocaine (EMLA) cream 1 application, 1 application, Topical, Q T,Th,Sa-HD, Agbata, Tochukwu, MD .  lipase/protease/amylase (CREON) capsule 24,000 Units, 24,000 Units, Oral, TID WC, Agbata, Tochukwu, MD, 24,000 Units at 03/24/20 1215 .  metoprolol succinate (TOPROL-XL) 24 hr tablet 50 mg, 50 mg, Oral, Daily, Agbata,  Tochukwu, MD, 50 mg at 03/24/20 0956 .  pantoprazole (PROTONIX) EC tablet 40 mg, 40 mg, Oral, Daily, Agbata, Tochukwu, MD, 40 mg at 03/24/20 0956 .  sodium chloride flush (NS) 0.9 % injection 3 mL, 3 mL, Intravenous, Q12H, Agbata, Tochukwu, MD, 3 mL at 03/24/20 0956 .  sodium chloride flush (NS) 0.9 % injection 3 mL, 3 mL, Intravenous, Q12H, Corey Skains, MD, 3 mL at 03/23/20 2019 .  traZODone (DESYREL) tablet 100 mg, 100 mg, Oral, QHS, Agbata, Tochukwu, MD, 100 mg at 03/23/20 2019   Exam: Current vital signs: BP (!) 158/70 (BP Location: Right Arm)   Pulse 75   Temp 97.8 F (36.6 C) (Oral)   Resp 19   Ht 6' (1.829 m)   Wt 80.8 kg   SpO2 100%   BMI 24.17 kg/m  Vital signs in last 24 hours: Temp:  [97.3 F (36.3 C)-98.6 F (37 C)] 97.8 F (36.6 C) (01/27 1207) Pulse Rate:  [63-77] 75 (01/27 1207) Resp:  [18-19] 19 (01/27 1207) BP: (139-160)/(65-70) 158/70 (01/27 1207) SpO2:  [97 %-100 %] 100 % (01/27 1207) General: He is awake, alert in no distress HEENT: Bruising around the right eye Lungs: Clear Abdomen nondistended nontender Cardiovascular: Regular rate rhythm Extremities warm well perfused Neurological exam Awake alert oriented to self and the fact that he is in the hospital. Could not tell me his correct age or month Speech is mildly dysarthric On asking to repeat long sentences and to name multiple objects, he starts off well but towards the later part of the sentences speech becomes garbled. He is able to follow commands He is able to repeat sentences Cranial nerves: Pupils equal round react light, extraocular movements intact, visual field full, face symmetric, facial sensation intact, tongue and palate midline. Motor exam: No drift in any of the 4 extremities Sensory exam intact to light touch without extinction No dysmetria noted on finger-nose-finger testing NIHSS 1a Level of Conscious.: 0 1b LOC Questions: 0 1c LOC Commands:0  2 Best Gaze: 0 3 Visual:  0 4 Facial Palsy:0  5a Motor Arm - left:0  5b Motor Arm - Right:0  6a Motor Leg - Left: 0 6b Motor Leg - Right:0  7 Limb Ataxia: 0 8 Sensory: 0 9 Best Language: 1 10 Dysarthria: 1 11 Extinct. and Inatten.: 0 TOTAL: 2   Labs I have reviewed labs in epic and the results pertinent to this consultation are:  CBC    Component Value Date/Time   WBC 6.2 03/23/2020 0929   RBC 3.35 (L) 03/23/2020 0929   HGB 10.3 (L) 03/23/2020 0929   HGB 14.2 08/08/2013 1947   HCT 32.6 (L) 03/23/2020 0929   HCT 42.1 08/08/2013 1947   PLT 129 (L) 03/23/2020 0929   PLT 144 (L) 08/08/2013 1947   MCV 97.3 03/23/2020 0929   MCV 92 08/08/2013 1947   MCH 30.7 03/23/2020 0929   MCHC 31.6 03/23/2020 0929   RDW 14.1 03/23/2020 0929   RDW 12.9 08/08/2013 1947   LYMPHSABS 1.1 03/15/2020 1653   MONOABS 0.6 03/15/2020 1653   EOSABS  0.1 03/15/2020 1653   BASOSABS 0.1 03/15/2020 1653    CMP     Component Value Date/Time   NA 134 (L) 03/23/2020 0929   NA 138 08/08/2013 1947   K 4.9 03/23/2020 0929   K 3.9 08/08/2013 1947   CL 94 (L) 03/23/2020 0929   CL 107 08/08/2013 1947   CO2 25 03/23/2020 0929   CO2 21 08/08/2013 1947   GLUCOSE 144 (H) 03/23/2020 0929   GLUCOSE 100 (H) 08/08/2013 1947   BUN 43 (H) 03/23/2020 0929   BUN 32 (H) 08/08/2013 1947   CREATININE 7.80 (H) 03/23/2020 0929   CREATININE 2.04 (H) 08/08/2013 1947   CALCIUM 8.1 (L) 03/23/2020 0929   CALCIUM 9.1 08/08/2013 1947   PROT 6.8 03/17/2020 0340   PROT 6.7 08/08/2013 1947   ALBUMIN 3.3 (L) 03/23/2020 0929   ALBUMIN 3.4 08/08/2013 1947   AST 35 03/17/2020 0340   AST 30 08/08/2013 1947   ALT 30 03/17/2020 0340   ALT 22 08/08/2013 1947   ALKPHOS 110 03/17/2020 0340   ALKPHOS 72 08/08/2013 1947   BILITOT 1.0 03/17/2020 0340   BILITOT 0.3 08/08/2013 1947   GFRNONAA 7 (L) 03/23/2020 0929   GFRNONAA 33 (L) 08/08/2013 1947   GFRAA 10 (L) 08/20/2019 1300   GFRAA 38 (L) 08/08/2013 1947    Lipid Panel     Component Value  Date/Time   CHOL 130 03/16/2019 0533   TRIG 207 (H) 03/16/2019 0533   HDL 41 03/16/2019 0533   CHOLHDL 3.2 03/16/2019 0533   VLDL 41 (H) 03/16/2019 0533   LDLCALC 48 03/16/2019 0533     Imaging I have reviewed the images obtained:  CT-scan of the brain from yesterday showed no significant interval change in the left-sided intracranial bleed compared to prior CT.  No new hemorrhage. CT head of the brain from 03/20/2020 showed left frontotemporal convexity subarachnoid hemorrhage with probable superimposed hemorrhagic contusion.  Overall extra-axial hemorrhage is not significantly changed-likely subdural.  Trace 2 mm left-to-right shift unchanged.  No hydrocephalus.  This was mildly worsened than the on arrival scan on 03/20/2020 which only showed a small volume subarachnoid and possible subdural along the superficial left temporal lobe but later on the hemorrhagic contusion also developed. Chronic left maxillary sinusitis.  Assessment:  70 year old man with history of strokes in seizures in the past, COPD depression diabetes hypertension hyperlipidemia admitted with a traumatic subarachnoid as well as traumatic hemorrhagic left temporal brain contusion with concern of mildly worse slurred speech. On examination he does have mild dysarthria, mild aphasia. I suspect his symptoms are related to his contusion and subarachnoid but given his history of seizures, although he is on antiepileptic, I will repeat some imaging and EEG.  Impression Traumatic subarachnoid hemorrhage Traumatic brain contusion History of seizures History of strokes   Recommendations: Routine EEG Routine CT head Maintain seizure precautions Continue Keppra 500 twice daily-renally dosed.  Appreciate pharmacy assistance in dosing He is also on Neurontin-although the dose is low-100 twice daily, sometimes Neurontin can cause myoclonic movements in patients with renal disease but that is not the case with him so I would  continue that for now.  If the CT head shows worsening, check with neurosurgery if there is any role for surgical management.  I doubt that that would be the case. I will continue to follow with you. Relayed my plan to Dr. Manuella Ghazi    -- Amie Portland, MD Neurologist [facility] Pager: [phone]

## 2020-03-24 NOTE — Progress Notes (Signed)
Physical Therapy Treatment Patient Details Name: Kyle [name] MRN: [id number] DOB: 03-04-1950 Today's Date: 03/24/2020    History of Present Illness Patient is a 70 year old male with PMH of ESRD on dialysis MWF, CVA with left side weakness , chronic diastolic heart failure, COPD, PAD, OSA, history of GI bleed, insulin-dependent diabetes, hyperlipidemia presents to the ER following syncopal episode and fall.  Patient has developed huge hematoma over his right eye after he was found on the floor. CT head and cervical spine shows small volume subarachnoid hemorrhage and possible subdural hemorrhage, along the superficial temporal lobe.    PT Comments    Pt was long sitting in bed finishing lunch upon arriving. He is alert and conversational but only oriented x 2. Pt has inconsistent cognition/aphasia during session. He was able to progress from long sitting to EOB short sitting with min assist.BP in bed: 155/73 Sat EOB x several minutes prior to standing and taking ~ 5 steps to recliner.BP sitting EOB: 148/65 Pt fatigued quickly. Author concerned with pt's progression of aphasia and cognition concerns, assisted pt back to bed and discussed with RN.  Pt will greatly benefit from SNF at DC to improve safety with functional mobility, transfers, and ambulation. Acute PT will continue to follow and progress as able per POC.    Follow Up Recommendations  SNF     Equipment Recommendations  Other (comment) (defer to next level of care)    Recommendations for Other Services       Precautions / Restrictions Precautions Precautions: Fall Precaution Comments: dizziness Restrictions Weight Bearing Restrictions: No    Mobility  Bed Mobility Overal bed mobility: Needs Assistance Bed Mobility: Supine to Sit;Sit to Supine Rolling: Supervision   Supine to sit: Min assist Sit to supine: Min assist   General bed mobility comments: pt required min assist to exit and re-enter bed. sat EOB for  prolong periods for safety prior to getting OOB.  Transfers Overall transfer level: Needs assistance Equipment used: Rolling walker (2 wheeled) Transfers: Sit to/from Stand Sit to Stand: Mod assist         General transfer comment: Mod assist to stand with slight R knee buckling. He was very reliant on RW for support  Ambulation/Gait Ambulation/Gait assistance: Min assist;Mod assist Gait Distance (Feet): 5 Feet Assistive device: Rolling walker (2 wheeled) Gait Pattern/deviations: Step-to pattern;Staggering left;Staggering right Gait velocity: decreased   General Gait Details: Pt was able to ambulate form EOB to recliner with slight RLE buckle. Pt's speech and cognition progressively got worse and pt was returned to bed and RN notified. after discussion with RN, pt has inconsistent aphasia/cognition.     Balance Overall balance assessment: Needs assistance Sitting-balance support: Feet supported Sitting balance-Leahy Scale: Fair Sitting balance - Comments: pt sat EOB x ~ 10 minutes without LOB   Standing balance support: During functional activity;Bilateral upper extremity supported Standing balance-Leahy Scale: Poor Standing balance comment: Pt is high fall roisk with poor insight of deficits       Cognition Arousal/Alertness: Awake/alert Behavior During Therapy: Flat affect (cognition inconsistent throughout session) Overall Cognitive Status: No family/caregiver present to determine baseline cognitive functioning      General Comments: Pt was alert upon arriving however only oriented x 2. Cognition varied throughout session. RN aware             Pertinent Vitals/Pain Pain Assessment: No/denies pain Faces Pain Scale: No hurt           PT Goals (current  goals can now be found in the care plan section) Acute Rehab PT Goals Patient Stated Goal: patient unable to state Progress towards PT goals: Progressing toward goals    Frequency    Min 2X/week       PT Plan Current plan remains appropriate    Co-evaluation              AM-PAC PT "6 Clicks" Mobility   Outcome Measure  Help needed turning from your back to your side while in a flat bed without using bedrails?: A Little Help needed moving from lying on your back to sitting on the side of a flat bed without using bedrails?: A Lot Help needed moving to and from a bed to a chair (including a wheelchair)?: A Lot Help needed standing up from a chair using your arms (e.g., wheelchair or bedside chair)?: A Lot Help needed to walk in hospital room?: A Lot Help needed climbing 3-5 steps with a railing? : Total 6 Click Score: 12    End of Session Equipment Utilized During Treatment: Gait belt Activity Tolerance: Patient limited by lethargy Patient left: in bed;with call bell/phone within reach;with bed alarm set Nurse Communication: Mobility status PT Visit Diagnosis: Other abnormalities of gait and mobility (R26.89);Muscle weakness (generalized) (M62.81);Unsteadiness on feet (R26.81);History of falling (Z91.81);Difficulty in walking, not elsewhere classified (R26.2)     Time: 9758-8325 PT Time Calculation (min) (ACUTE ONLY): 28 min  Charges:  $Therapeutic Activity: 23-37 mins                     Julaine Fusi PTA 03/24/20, 2:09 PM

## 2020-03-24 NOTE — Progress Notes (Signed)
PROGRESS NOTE    Kyle [name]  MVE:[id number] DOB: Apr 28, 1950 DOA: 03/20/2020 PCP: Idelle Crouch, MD   Brief Narrative:  This 70 years old male with PMH significant for ESRD on dialysis MWF, CVA, chronic diastolic heart failure, COPD, PAD, OSA, history of GI bleed, insulin-dependent diabetes, hyperlipidemia presents to the ER for the evaluation of fall following syncopal episode.  Patient has developed huge hematoma over his right eye after he was found on the floor.  Patient does not remember how did this happen. CT head and cervical spine shows small volume subarachnoid hemorrhage and possible subdural hemorrhage, along the superficial temporal lobe.  No significant mass-effect. Of note patient was recently discharged from hospital about 3 days ago where he was evaluated for GI bleed.   Patient is admitted for possible syncopal episode, Neurosurgery, Cardiology and Nephrology consults were called.  Patient underwent hemodialysis on 1/24.  Renal status is stable.  Cardiology is recommending Linq device tomorrow for detection of heart arrhythmias causing syncope.  Neurosurgery recommended no intervention.  1/26: patient in some pain but manageable. Wife requests to repeat CT head which is ordered for today. Palliative care c/s per wife request. Waiting for SNF  1/27: nursing reported some slurred speech and delayed response. Nothing significant and likely his baseline. Neuro c/s, CT head is unchanged, EEG pending   Assessment & Plan:   Principal Problem:   Syncope and collapse Active Problems:   Chronic obstructive pulmonary disease (HCC)   Seizure disorder (HCC)   Hypothyroidism   ESRD (end stage renal disease) on dialysis (HCC)   Falls frequently   Depression   Chronic diastolic CHF (congestive heart failure) (HCC)   SAH (subarachnoid hemorrhage) (HCC)   Chronic sinusitis   Fall  Syncope and collapse: Patient was found on the floor by his wife following a fall. Patient  does not remember the events and wife did not see any jerking motions suggestive of a seizure.   He was not incontinent of urine or feces.  2D echo: LVEF 35 to 40%.  Left ventricle has decreased function.  There is global hypokinesis. Cardiology -> Patient does not have any telemetry changes or rhythm disturbance consistent with episode of syncope.   Recommended Linq device but wife not agreeable per cardio note  Subarachnoid bleed, traumatic Traumatic and following a fall Neurosurgery seen and recommends hold Plavix and anticoagulants. Repeat CT head shows stable subarachnoid hemorrhage.  Hold Plavix for 7 days, hold DVT prophylaxis for 48 hours.  Follow-up in neurosurgery clinic in 2 to 3 weeks.  Acute metabolic encephalopathy that could be due to [facility]: Patient remains intermittently confused since arrival. Some slowed speech/delayed response UA: unremarkable, vitals are stable. Most likely could be due to subarachnoid hemorrhage. No source of infection found.  Labs unremarkable. Some nursing concern on 1/27 -> slurred speech and delayed responses - Neuro c/s - repeat CT head unchanged, EEG pending  Seizure disorder;  Chronic and stable. Continue Keppra 500 mg twice a day Continue seizure precautions EEG pending  History of CVA with left-sided weakness Continue metoprolol and statins. Hold Plavix due to subarachnoid bleed  Diabetes mellituswith complications of end-stage heart disease on hemodialysis Maintain consistent carbohydrate diet Sliding scale coverage with insulin Dialysis daysare M/W/F HD per nephro  Depression Continue trazodone and Lexapro  Hypothyroidism Continue Synthroid  Hypomagnesemia Supplement magnesium  Frequent falls Patient's wife is concerned about frequent falls at home with increased risk for fractures. PT, OT -> SNF  Chronic sinusitis Patient  had a CT scan of the head and cervical spine which showed chronic sinusitis from left  OMU obstruction with complicated disease at the left maxillary sinus with chronic defect in the posterior wall. Discussed with ENT Dr. Richardson Landry who recommended empiric antibiotic coverage with Augmentin and to have patient follow-up with Dr. Kathyrn Sheriff as an outpatient to discuss surgical management. Upon discharge patient will need appointment to follow-up with Dr. Kathyrn Sheriff    Palliative care input appreciated - PC to follow at SNF  DVT prophylaxis: SCDs Code Status: DNR Family Communication: No family at bedside.  Spoke with wife on phone on 1/26 Disposition Plan:  Status is: Inpatient  Remains inpatient appropriate because:Inpatient level of care appropriate due to severity of illness   Dispo: The patient is from: Home              Anticipated d/c is to: SNF              Anticipated d/c date is: > 3 days              Patient currently is not medically stable to d/c.waiting for placement   Difficult to place patient Yes    Consultants:   Neurosurgery/cardiology/nephrology  Procedures:  Antimicrobials:  Anti-infectives (From admission, onward)   Start     Dose/Rate Route Frequency Ordered Stop   03/21/20 1800  amoxicillin-clavulanate (AUGMENTIN) 500-125 MG per tablet 500 mg        1 tablet Oral Every 24 hours 03/20/20 1416     03/20/20 1300  amoxicillin-clavulanate (AUGMENTIN) 875-125 MG per tablet 1 tablet  Status:  Discontinued        1 tablet Oral Every 24 hours 03/20/20 1213 03/20/20 1416      Subjective: Remains intermittently confused, seems to disoriented, not following commands. In some pain but manageable   Objective: Vitals:   03/24/20 1207 03/24/20 1606 03/24/20 1608 03/24/20 1610  BP: (!) 158/70 (!) 147/75 (!) 141/77 (!) 146/68  Pulse: 75 73 74 75  Resp: 19 19    Temp: 97.8 F (36.6 C) 97.7 F (36.5 C)    TempSrc: Oral     SpO2: 100% 100% 100% 100%  Weight:      Height:        Intake/Output Summary (Last 24 hours) at 03/24/2020 1719 Last data filed  at 03/24/2020 1606 Gross per 24 hour  Intake 720 ml  Output 0 ml  Net 720 ml   Filed Weights   03/21/20 1728 03/22/20 0100 03/23/20 0408  Weight: 80.1 kg 80.7 kg 80.8 kg    Examination:  General exam: Appears calm and comfortable, hematoma noted around right eye. Respiratory system: Clear to auscultation. Respiratory effort normal. Cardiovascular system: S1 & S2 heard, RRR. No JVD, murmurs, rubs, gallops or clicks. No pedal edema. Gastrointestinal system: Abdomen is nondistended, soft and nontender. No organomegaly or masses felt. Normal bowel sounds heard. Central nervous system: Alert and oriented. No focal neurological deficits.  Extremities: No edema, no cyanosis no clubbing. Skin: No rashes, lesions or ulcers Psychiatry: Not assessed   Data Reviewed: I have personally reviewed following labs and imaging studies  CBC: Recent Labs  Lab 03/20/20 0717 03/21/20 0340 03/22/20 0631 03/23/20 0929  WBC 5.4 5.8 6.6 6.2  HGB 10.8* 10.0* 10.4* 10.3*  HCT 33.4* 31.7* 32.3* 32.6*  MCV 99.4 99.1 97.9 97.3  PLT 140* 142* 149* 809*   Basic Metabolic Panel: Recent Labs  Lab 03/20/20 0717 03/21/20 0340 03/22/20 0631 03/23/20 9833  NA 138 137 138 134*  K 4.3 4.6 4.6 4.9  CL 98 96* 97* 94*  CO2 26 23 23 25   GLUCOSE 209* 146* 115* 144*  BUN 30* 37* 29* 43*  CREATININE 6.78* 7.74* 6.22* 7.80*  CALCIUM 7.0* 7.3* 8.3* 8.1*  MG  --   --  1.5*  --   PHOS  --   --  5.0* 5.5*   GFR: Estimated Creatinine Clearance: 9.5 mL/min (A) (by C-G formula based on SCr of 7.8 mg/dL (H)). Liver Function Tests: Recent Labs  Lab 03/23/20 0929  ALBUMIN 3.3*   No results for input(s): LIPASE, AMYLASE in the last 168 hours. No results for input(s): AMMONIA in the last 168 hours. Coagulation Profile: Recent Labs  Lab 03/20/20 0904  INR 1.1   Cardiac Enzymes: No results for input(s): CKTOTAL, CKMB, CKMBINDEX, TROPONINI in the last 168 hours. BNP (last 3 results) No results for input(s):  PROBNP in the last 8760 hours. HbA1C: No results for input(s): HGBA1C in the last 72 hours. CBG: Recent Labs  Lab 03/23/20 2240 03/24/20 0518 03/24/20 0851 03/24/20 1201 03/24/20 1612  GLUCAP 194* 131* 138* 211* 154*   Lipid Profile: No results for input(s): CHOL, HDL, LDLCALC, TRIG, CHOLHDL, LDLDIRECT in the last 72 hours. Thyroid Function Tests: No results for input(s): TSH, T4TOTAL, FREET4, T3FREE, THYROIDAB in the last 72 hours. Anemia Panel: No results for input(s): VITAMINB12, FOLATE, FERRITIN, TIBC, IRON, RETICCTPCT in the last 72 hours. Sepsis Labs: No results for input(s): PROCALCITON, LATICACIDVEN in the last 168 hours.  Recent Results (from the past 240 hour(s))  SARS CORONAVIRUS 2 (TAT 6-24 HRS) Nasopharyngeal Nasopharyngeal Swab     Status: None   Collection Time: 03/15/20 10:41 PM   Specimen: Nasopharyngeal Swab  Result Value Ref Range Status   SARS Coronavirus 2 NEGATIVE NEGATIVE Final    Comment: (NOTE) SARS-CoV-2 target nucleic acids are NOT DETECTED.  The SARS-CoV-2 RNA is generally detectable in upper and lower respiratory specimens during the acute phase of infection. Negative results do not preclude SARS-CoV-2 infection, do not rule out co-infections with other pathogens, and should not be used as the sole basis for treatment or other patient management decisions. Negative results must be combined with clinical observations, patient history, and epidemiological information. The expected result is Negative.  Fact Sheet for Patients: SugarRoll.be  Fact Sheet for Healthcare Providers: https://www.woods-mathews.com/  This test is not yet approved or cleared by the [place] FDA and  has been authorized for detection and/or diagnosis of SARS-CoV-2 by FDA under an Emergency Use Authorization (EUA). This EUA will remain  in effect (meaning this test can be used) for the duration of the COVID-19 declaration  under Se ction 564(b)(1) of the Act, 21 U.S.C. section 360bbb-3(b)(1), unless the authorization is terminated or revoked sooner.  Performed at [facility], [place] [address] [place], [place] [address]   SARS Coronavirus 2 by RT PCR (hospital order, performed in [facility] hospital lab) Nasopharyngeal Nasopharyngeal Swab     Status: None   Collection Time: 03/20/20  9:04 AM   Specimen: Nasopharyngeal Swab  Result Value Ref Range Status   SARS Coronavirus 2 NEGATIVE NEGATIVE Final    Comment: (NOTE) SARS-CoV-2 target nucleic acids are NOT DETECTED.  The SARS-CoV-2 RNA is generally detectable in upper and lower respiratory specimens during the acute phase of infection. The lowest concentration of SARS-CoV-2 viral copies this assay can detect is 250 copies / mL. A negative result does not preclude  SARS-CoV-2 infection and should not be used as the sole basis for treatment or other patient management decisions.  A negative result may occur with improper specimen collection / handling, submission of specimen other than nasopharyngeal swab, presence of viral mutation(s) within the areas targeted by this assay, and inadequate number of viral copies (<250 copies / mL). A negative result must be combined with clinical observations, patient history, and epidemiological information.  Fact Sheet for Patients:   StrictlyIdeas.no  Fact Sheet for Healthcare Providers: BankingDealers.co.za  This test is not yet approved or  cleared by the [place] FDA and has been authorized for detection and/or diagnosis of SARS-CoV-2 by FDA under an Emergency Use Authorization (EUA).  This EUA will remain in effect (meaning this test can be used) for the duration of the COVID-19 declaration under Section 564(b)(1) of the Act, 21 U.S.C. section 360bbb-3(b)(1), unless the authorization is terminated or revoked sooner.  Performed at [facility], [address] [place], [place] [address]      Radiology Studies: CT HEAD WO CONTRAST  Result Date: 03/24/2020 CLINICAL DATA:  Traumatic brain injury (TBI), new cognitive or neuro deficit EXAM: CT HEAD WITHOUT CONTRAST TECHNIQUE: Contiguous axial images were obtained from the base of the skull through the vertex without intravenous contrast. COMPARISON:  03/23/2020 and prior. FINDINGS: Brain: Left cerebral convexity subdural hematoma measuring up to 3 mm is grossly unchanged. Left temporal intraparenchymal hemorrhage measuring 2.6 x 2.0 cm is similar to prior exam (2:11). Small volume left cerebral convexity subarachnoid hemorrhage is unchanged. No intraventricular extension. No new focal hypodensity. No mass lesion. No midline shift, ventriculomegaly. Mild cerebral atrophy with ex vacuo dilatation. Chronic microvascular ischemic changes. Vascular: No hyperdense vessel or unexpected calcification. Skull: Negative for fracture or focal lesion. Sinuses/Orbits: Normal orbits. Opacification of the left frontal, ethmoid and maxillary sinus with chronic sequela is unchanged. Other: None. IMPRESSION: Left temporal intraparenchymal hemorrhage measuring 2.6 cm is unchanged. Small left cerebral convexity subdural hematoma with subarachnoid hemorrhage, similar prior exam. Electronically Signed   By: Primitivo Gauze M.D.   On: 03/24/2020 17:14   CT HEAD WO CONTRAST  Result Date: 03/23/2020 CLINICAL DATA:  70 year old male with head trauma. Follow-up intracranial hemorrhage. EXAM: CT HEAD WITHOUT CONTRAST TECHNIQUE: Contiguous axial images were obtained from the base of the skull through the vertex without intravenous contrast. COMPARISON:  Head CT dated 03/20/2020. FINDINGS: Brain: Left-sided subdural hemorrhage measuring approximately 4 mm in thickness over the left occipital lobe and 2 mm over the left temporal lobe and without significant interval change. Left temporal intraparenchymal hemorrhage with  small subarachnoid components not significantly change. There has been however interval development of mild edema surrounding the left temporal intraparenchymal bleed. There is associated mild mass effect. No significant midline shift. No new bleed. There is mild age-related atrophy and moderate chronic microvascular ischemic changes. Small old lacunar infarct in the pons. Vascular: No hyperdense vessel or unexpected calcification. Skull: No acute calvarial pathology. Sinuses/Orbits: There is complete opacification of the left frontal, ethmoid, and maxillary sinus with remodeling of the medial and lateral walls of the left maxillary sinus as seen previously. No air-fluid level. The mastoid air cells are clear. Other: None IMPRESSION: 1. No significant interval change in the left sided intracranial bleed compared to prior CT. No new hemorrhage. Continued follow-up recommended. 2. No significant midline shift. 3. Age-related atrophy and chronic microvascular ischemic changes. 4. Chronic left paranasal sinus disease. Electronically Signed   By: Laren Everts.D.  On: 03/23/2020 16:07    Scheduled Meds: . amoxicillin-clavulanate  1 tablet Oral Q24H  . atorvastatin  10 mg Oral Daily  . b complex-vitamin c-folic acid  1 tablet Oral Daily  . calcium acetate  1,334 mg Oral TID WC  . Chlorhexidine Gluconate Cloth  6 each Topical Daily  . gabapentin  100 mg Oral BID  . insulin aspart  0-6 Units Subcutaneous TID WC  . levETIRAcetam  500 mg Oral BID  . levothyroxine  200 mcg Oral QAC breakfast  . lidocaine-prilocaine  1 application Topical Q T,Th,Sa-HD  . lipase/protease/amylase  24,000 Units Oral TID WC  . metoprolol succinate  50 mg Oral Daily  . pantoprazole  40 mg Oral Daily  . sodium chloride flush  3 mL Intravenous Q12H  . sodium chloride flush  3 mL Intravenous Q12H  . traZODone  100 mg Oral QHS   Continuous Infusions:   LOS: 2 days    Time spent: 25 mins    Max Sane, MD [facility]   If 7PM-7AM, please contact night-coverage

## 2020-03-24 NOTE — Progress Notes (Signed)
[facility]) Hospital Liaison RN note:  Received new referral for [facility] out patient palliative program to follow post discharge from Quinn Axe, NP. Patient information given to referral. Plan is for discharge to SNF for STR. Awanya Caesar, TOC is aware. [facility] Liaison will follow for disposition.   Thank you for this referral.  Loney Laurence [facility] Liaison [phone]

## 2020-03-24 NOTE — Progress Notes (Signed)
[facility]  ROUNDING NOTE   Subjective:     Hemodialysis treatment yesterday. Tolerated treatment well. UF of 1557mL.  Patient's mental status has been improving. Alert to self and place this morning.   Objective:  Vital signs in last 24 hours:  Temp:  [97.3 F (36.3 C)-98.9 F (37.2 C)] 97.3 F (36.3 C) (01/27 0741) Pulse Rate:  [63-77] 63 (01/27 0741) Resp:  [13-19] 18 (01/27 0741) BP: (139-173)/(65-82) 160/65 (01/27 0741) SpO2:  [97 %-100 %] 97 % (01/27 0741)  Weight change:  Filed Weights   03/21/20 1728 03/22/20 0100 03/23/20 0408  Weight: 80.1 kg 80.7 kg 80.8 kg    Intake/Output: I/O last 3 completed shifts: In: 280 [P.O.:280] Out: 1500 [Other:1500]   Intake/Output this shift:  No intake/output data recorded.  Physical Exam: General: NAD  Head: Right ecchymosis+. Moist oral mucosal membranes  Eyes: + large ecchymosis around right eye   Neck: Supple, trachea midline  Lungs:  Clear to auscultation  Heart: Regular rate and rhythm  Abdomen:  Soft, nontender,   Extremities:  No peripheral edema.  Neurologic: Alert and oriented to self and place  Skin: abrasions on his right arm   Access: Left upper extremity AVF, +thrill      Basic Metabolic Panel: Recent Labs  Lab 03/20/20 0717 03/21/20 0340 03/22/20 0631 03/23/20 0929  NA 138 137 138 134*  K 4.3 4.6 4.6 4.9  CL 98 96* 97* 94*  CO2 26 23 23 25   GLUCOSE 209* 146* 115* 144*  BUN 30* 37* 29* 43*  CREATININE 6.78* 7.74* 6.22* 7.80*  CALCIUM 7.0* 7.3* 8.3* 8.1*  MG  --   --  1.5*  --   PHOS  --   --  5.0* 5.5*    Liver Function Tests: Recent Labs  Lab 03/23/20 0929  ALBUMIN 3.3*   No results for input(s): LIPASE, AMYLASE in the last 168 hours. No results for input(s): AMMONIA in the last 168 hours.  CBC: Recent Labs  Lab 03/20/20 0717 03/21/20 0340 03/22/20 0631 03/23/20 0929  WBC 5.4 5.8 6.6 6.2  HGB 10.8* 10.0* 10.4* 10.3*  HCT 33.4* 31.7* 32.3* 32.6*  MCV 99.4 99.1  97.9 97.3  PLT 140* 142* 149* 129*    Cardiac Enzymes: No results for input(s): CKTOTAL, CKMB, CKMBINDEX, TROPONINI in the last 168 hours.  BNP: Invalid input(s): POCBNP  CBG: Recent Labs  Lab 03/23/20 1401 03/23/20 1556 03/23/20 2240 03/24/20 0518 03/24/20 0851  GLUCAP 93 148* 194* 131* 138*    Microbiology: Results for orders placed or performed during the hospital encounter of 03/20/20  SARS Coronavirus 2 by RT PCR (hospital order, performed in [facility] hospital lab) Nasopharyngeal Nasopharyngeal Swab     Status: None   Collection Time: 03/20/20  9:04 AM   Specimen: Nasopharyngeal Swab  Result Value Ref Range Status   SARS Coronavirus 2 NEGATIVE NEGATIVE Final    Comment: (NOTE) SARS-CoV-2 target nucleic acids are NOT DETECTED.  The SARS-CoV-2 RNA is generally detectable in upper and lower respiratory specimens during the acute phase of infection. The lowest concentration of SARS-CoV-2 viral copies this assay can detect is 250 copies / mL. A negative result does not preclude SARS-CoV-2 infection and should not be used as the sole basis for treatment or other patient management decisions.  A negative result may occur with improper specimen collection / handling, submission of specimen other than nasopharyngeal swab, presence of viral mutation(s) within the areas targeted by this assay, and inadequate  number of viral copies (<250 copies / mL). A negative result must be combined with clinical observations, patient history, and epidemiological information.  Fact Sheet for Patients:   StrictlyIdeas.no  Fact Sheet for Healthcare Providers: BankingDealers.co.za  This test is not yet approved or  cleared by the [place] FDA and has been authorized for detection and/or diagnosis of SARS-CoV-2 by FDA under an Emergency Use Authorization (EUA).  This EUA will remain in effect (meaning this test can be used) for the  duration of the COVID-19 declaration under Section 564(b)(1) of the Act, 21 U.S.C. section 360bbb-3(b)(1), unless the authorization is terminated or revoked sooner.  Performed at [facility], [address] [place], [place] [address]     Coagulation Studies: No results for input(s): LABPROT, INR in the last 72 hours.  Urinalysis: No results for input(s): COLORURINE, LABSPEC, PHURINE, GLUCOSEU, HGBUR, BILIRUBINUR, KETONESUR, PROTEINUR, UROBILINOGEN, NITRITE, LEUKOCYTESUR in the last 72 hours.  Invalid input(s): APPERANCEUR    Imaging: CT HEAD WO CONTRAST  Result Date: 03/23/2020 CLINICAL DATA:  70 year old male with head trauma. Follow-up intracranial hemorrhage. EXAM: CT HEAD WITHOUT CONTRAST TECHNIQUE: Contiguous axial images were obtained from the base of the skull through the vertex without intravenous contrast. COMPARISON:  Head CT dated 03/20/2020. FINDINGS: Brain: Left-sided subdural hemorrhage measuring approximately 4 mm in thickness over the left occipital lobe and 2 mm over the left temporal lobe and without significant interval change. Left temporal intraparenchymal hemorrhage with small subarachnoid components not significantly change. There has been however interval development of mild edema surrounding the left temporal intraparenchymal bleed. There is associated mild mass effect. No significant midline shift. No new bleed. There is mild age-related atrophy and moderate chronic microvascular ischemic changes. Small old lacunar infarct in the pons. Vascular: No hyperdense vessel or unexpected calcification. Skull: No acute calvarial pathology. Sinuses/Orbits: There is complete opacification of the left frontal, ethmoid, and maxillary sinus with remodeling of the medial and lateral walls of the left maxillary sinus as seen previously. No air-fluid level. The mastoid air cells are clear. Other: None IMPRESSION: 1. No significant interval change in the left sided  intracranial bleed compared to prior CT. No new hemorrhage. Continued follow-up recommended. 2. No significant midline shift. 3. Age-related atrophy and chronic microvascular ischemic changes. 4. Chronic left paranasal sinus disease. Electronically Signed   By: Anner Crete M.D.   On: 03/23/2020 16:07     Medications:    . amoxicillin-clavulanate  1 tablet Oral Q24H  . atorvastatin  10 mg Oral Daily  . b complex-vitamin c-folic acid  1 tablet Oral Daily  . calcium acetate  1,334 mg Oral TID WC  . Chlorhexidine Gluconate Cloth  6 each Topical Daily  . gabapentin  100 mg Oral BID  . insulin aspart  0-6 Units Subcutaneous TID WC  . levETIRAcetam  500 mg Oral BID  . levothyroxine  200 mcg Oral QAC breakfast  . lidocaine-prilocaine  1 application Topical Q T,Th,Sa-HD  . lipase/protease/amylase  24,000 Units Oral TID WC  . metoprolol succinate  50 mg Oral Daily  . pantoprazole  40 mg Oral Daily  . sodium chloride flush  3 mL Intravenous Q12H  . sodium chloride flush  3 mL Intravenous Q12H  . traZODone  100 mg Oral QHS   acetaminophen **OR** acetaminophen  Assessment/ Plan:  Kyle [name] is a 70 y.o.  male  past medical history of CVA with residual left-sided weakness, ESRD on HD MWF, seizure disorder, diabetes mellitus,  hypertension, anemia of chronic kidney disease, secondary hyperparathyroidism who was admitted post fall. Found to have subarachnoid hemorrhage. Syncope work up negative so far.  [facility] MWF [address] Left AVF 79.5kg  1.  ESRD on HD MWF. Tolerated hemodialysis treatment yesterday. Next treatment for tomorrow.   2.  Anemia of chronic kidney disease: hemoglobin 10.3 - EPO with HD treatments  3.  Secondary hyperparathyroidism. Calcium and phosphorus at goal.  -  Continue calcium acetate with meals  4.  Hypertension.  blood pressures mostly at goal.  - metoprolol   LOS: 2 Kyle [name] 1/27/202210:09 AM

## 2020-03-24 NOTE — Progress Notes (Signed)
eeg done °

## 2020-03-24 NOTE — Progress Notes (Signed)
Mobility Specialist - Progress Note   03/24/20 1535  Mobility  Activity Contraindicated/medical hold  Mobility performed by Mobility specialist    Pt currently out of room for procedure. Will hold off and re-attempt at a later date/time if pt is appropriate.    Tally Mckinnon Mobility Specialist  03/24/20, 3:36 PM

## 2020-03-25 DIAGNOSIS — N186 End stage renal disease: Secondary | ICD-10-CM

## 2020-03-25 DIAGNOSIS — J42 Unspecified chronic bronchitis: Secondary | ICD-10-CM | POA: Diagnosis not present

## 2020-03-25 DIAGNOSIS — Z992 Dependence on renal dialysis: Secondary | ICD-10-CM

## 2020-03-25 DIAGNOSIS — I5032 Chronic diastolic (congestive) heart failure: Secondary | ICD-10-CM | POA: Diagnosis not present

## 2020-03-25 DIAGNOSIS — F32A Depression, unspecified: Secondary | ICD-10-CM | POA: Diagnosis not present

## 2020-03-25 DIAGNOSIS — G40909 Epilepsy, unspecified, not intractable, without status epilepticus: Secondary | ICD-10-CM | POA: Diagnosis not present

## 2020-03-25 DIAGNOSIS — S062X0D Diffuse traumatic brain injury without loss of consciousness, subsequent encounter: Secondary | ICD-10-CM

## 2020-03-25 LAB — BASIC METABOLIC PANEL
Anion gap: 13 (ref 5–15)
BUN: 40 mg/dL — ABNORMAL HIGH (ref 8–23)
CO2: 26 mmol/L (ref 22–32)
Calcium: 8.2 mg/dL — ABNORMAL LOW (ref 8.9–10.3)
Chloride: 97 mmol/L — ABNORMAL LOW (ref 98–111)
Creatinine, Ser: 7.22 mg/dL — ABNORMAL HIGH (ref 0.61–1.24)
GFR, Estimated: 8 mL/min — ABNORMAL LOW (ref >60)
Glucose, Bld: 125 mg/dL — ABNORMAL HIGH (ref 70–99)
Potassium: 4.3 mmol/L (ref 3.5–5.1)
Sodium: 136 mmol/L (ref 135–145)

## 2020-03-25 LAB — CBC
HCT: 30.8 % — ABNORMAL LOW (ref 39.0–52.0)
Hemoglobin: 10.2 g/dL — ABNORMAL LOW (ref 13.0–17.0)
MCH: 31.7 pg (ref 26.0–34.0)
MCHC: 33.1 g/dL (ref 30.0–36.0)
MCV: 95.7 fL (ref 80.0–100.0)
Platelets: 148 10*3/uL — ABNORMAL LOW (ref 150–400)
RBC: 3.22 MIL/uL — ABNORMAL LOW (ref 4.22–5.81)
RDW: 13.7 % (ref 11.5–15.5)
WBC: 5.7 10*3/uL (ref 4.0–10.5)
nRBC: 0 % (ref 0.0–0.2)

## 2020-03-25 LAB — GLUCOSE, CAPILLARY
Glucose-Capillary: 145 mg/dL — ABNORMAL HIGH (ref 70–99)
Glucose-Capillary: 129 mg/dL — ABNORMAL HIGH (ref 70–99)
Glucose-Capillary: 205 mg/dL — ABNORMAL HIGH (ref 70–99)
Glucose-Capillary: 144 mg/dL — ABNORMAL HIGH (ref 70–99)

## 2020-03-25 LAB — HEPATITIS B SURFACE ANTIGEN: Hepatitis B Surface Ag: NONREACTIVE

## 2020-03-25 MED ORDER — LISINOPRIL 5 MG PO TABS
5.0000 mg | ORAL_TABLET | Freq: Every day | ORAL | Status: DC
  Administered 2020-03-25 – 2020-03-31 (×7): 5 mg via ORAL
  Filled 2020-03-25 (×8): qty 1

## 2020-03-25 NOTE — Progress Notes (Signed)
PROGRESS NOTE    Steffan Caniglia  XNT:[id number] DOB: 1950-05-30 DOA: 03/20/2020 PCP: Idelle Crouch, MD   Brief Narrative:  This 70 years old male with PMH significant for ESRD on dialysis MWF, CVA, chronic diastolic heart failure, COPD, PAD, OSA, history of GI bleed, insulin-dependent diabetes, hyperlipidemia presents to the ER for the evaluation of fall following syncopal episode.  Patient has developed huge hematoma over his right eye after he was found on the floor.  Patient does not remember how did this happen. CT head and cervical spine shows small volume subarachnoid hemorrhage and possible subdural hemorrhage, along the superficial temporal lobe.  No significant mass-effect. Of note patient was recently discharged from hospital about 3 days ago where he was evaluated for GI bleed.   Patient is admitted for possible syncopal episode, Neurosurgery, Cardiology and Nephrology consults were called.  Patient underwent hemodialysis on 1/24.  Renal status is stable.  Cardiology is recommending Linq device tomorrow for detection of heart arrhythmias causing syncope.  Neurosurgery recommended no intervention.  1/26: patient in some pain but manageable. Wife requests to repeat CT head which is ordered for today. Palliative care c/s per wife request. Waiting for SNF  1/27: nursing reported some slurred speech and delayed response. Nothing significant and likely his baseline. Neuro c/s, CT head is unchanged, EEG pending  1/28: EEG normal, getting dialysis today   Assessment & Plan:   Principal Problem:   Syncope and collapse Active Problems:   Chronic obstructive pulmonary disease (HCC)   Seizure disorder (HCC)   Hypothyroidism   ESRD (end stage renal disease) on dialysis (HCC)   Falls frequently   Depression   Chronic diastolic CHF (congestive heart failure) (HCC)   SAH (subarachnoid hemorrhage) (HCC)   Chronic sinusitis   Fall  Syncope and collapse: Patient was found on the  floor by his wife following a fall. Patient does not remember the events and wife did not see any jerking motions suggestive of a seizure.   He was not incontinent of urine or feces.  2D echo: LVEF 35 to 40%.  Left ventricle has decreased function.  There is global hypokinesis. Cardiology -> Patient does not have any telemetry changes or rhythm disturbance consistent with episode of syncope.   Recommended Linq device but wife not agreeable per cardio note  Subarachnoid bleed, traumatic Traumatic and following a fall Neurosurgery seen and recommends hold Plavix and anticoagulants. Repeat CT head shows stable subarachnoid hemorrhage.  Hold Plavix for 7 days, hold DVT prophylaxis for 48 hours.  Follow-up in neurosurgery clinic in 2 to 3 weeks.  Acute metabolic encephalopathy that could be due to [facility]: Patient remains intermittently confused since arrival. Some slowed speech/delayed response UA: unremarkable, vitals are stable. Most likely could be due to subarachnoid hemorrhage. No source of infection found.  Labs unremarkable. Nursing/PT concern on 1/27 -> slurred speech and delayed responses - Neuro input appreciated- repeat CT head unchanged, EEG normal  Seizure disorder;  Chronic and stable. Continue Keppra 500 mg twice a day Continue seizure precautions EEG normal  History of CVA with left-sided weakness Continue metoprolol and statins. Hold Plavix due to subarachnoid bleed  Diabetes mellituswith complications of end-stage heart disease on hemodialysis Maintain consistent carbohydrate diet Sliding scale coverage with insulin Dialysis daysare M/W/F HD per nephro  Depression Continue trazodone and Lexapro  Hypertension -on metoprolol  blood pressure running high, will add lisinopril on 1/28 for better blood pressure control   Hypothyroidism Continue Synthroid  Hypomagnesemia Supplement  magnesium  Frequent falls Patient's wife is concerned about frequent  falls at home with increased risk for fractures. PT, OT -> SNF  Chronic sinusitis Patient had a CT scan of the head and cervical spine which showed chronic sinusitis from left OMU obstruction with complicated disease at the left maxillary sinus with chronic defect in the posterior wall. Discussed with ENT Dr. Richardson Landry who recommended empiric antibiotic coverage with Augmentin and to have patient follow-up with Dr. Kathyrn Sheriff as an outpatient to discuss surgical management. Upon discharge patient will need appointment to follow-up with Dr. Kathyrn Sheriff    Palliative care input appreciated - PC to follow at SNF  DVT prophylaxis: SCDs Code Status: DNR Family Communication: No family at bedside.  Spoke with wife on phone on 1/26 Disposition Plan: SNF  Status is: Inpatient  Remains inpatient appropriate because:Inpatient level of care appropriate due to severity of illness   Dispo: The patient is from: Home              Anticipated d/c is to: SNF              Anticipated d/c date is: > 3 days              Patient currently is not medically stable to d/c.waiting for placement   Difficult to place patient Yes    Consultants:   Neurosurgery/cardiology/nephrology  Procedures:  Antimicrobials:  Anti-infectives (From admission, onward)   Start     Dose/Rate Route Frequency Ordered Stop   03/21/20 1800  amoxicillin-clavulanate (AUGMENTIN) 500-125 MG per tablet 500 mg        1 tablet Oral Every 24 hours 03/20/20 1416     03/20/20 1300  amoxicillin-clavulanate (AUGMENTIN) 875-125 MG per tablet 1 tablet  Status:  Discontinued        1 tablet Oral Every 24 hours 03/20/20 1213 03/20/20 1416      Subjective: Seen at dialysis.  Remains alert.  No new issues Objective: Vitals:   03/25/20 1115 03/25/20 1130 03/25/20 1145 03/25/20 1200  BP: (!) 165/61 (!) 168/65 (!) 167/68 (!) 170/72  Pulse: 67 68 73 74  Resp: 15 16 14 19   Temp:      TempSrc:      SpO2:      Weight:      Height:         Intake/Output Summary (Last 24 hours) at 03/25/2020 1304 Last data filed at 03/25/2020 1015 Gross per 24 hour  Intake 960 ml  Output 0 ml  Net 960 ml   Filed Weights   03/22/20 0100 03/23/20 0408 03/25/20 0551  Weight: 80.7 kg 80.8 kg 81.2 kg    Examination:  General exam: Appears calm and comfortable, hematoma noted around right eye. Respiratory system: Clear to auscultation. Respiratory effort normal. Cardiovascular system: S1 & S2 heard, RRR. No JVD, murmurs, rubs, gallops or clicks. No pedal edema. Gastrointestinal system: Abdomen is nondistended, soft and nontender. No organomegaly or masses felt. Normal bowel sounds heard. Central nervous system: Alert and oriented. No focal neurological deficits.  Extremities: No edema, no cyanosis no clubbing. Skin: No rashes, lesions or ulcers Psychiatry: Not assessed   Data Reviewed: I have personally reviewed following labs and imaging studies  CBC: Recent Labs  Lab 03/20/20 0717 03/21/20 0340 03/22/20 0631 03/23/20 0929 03/25/20 0605  WBC 5.4 5.8 6.6 6.2 5.7  HGB 10.8* 10.0* 10.4* 10.3* 10.2*  HCT 33.4* 31.7* 32.3* 32.6* 30.8*  MCV 99.4 99.1 97.9 97.3 95.7  PLT 140*  142* 149* 129* 245*   Basic Metabolic Panel: Recent Labs  Lab 03/20/20 0717 03/21/20 0340 03/22/20 0631 03/23/20 0929 03/25/20 0605  NA 138 137 138 134* 136  K 4.3 4.6 4.6 4.9 4.3  CL 98 96* 97* 94* 97*  CO2 26 23 23 25 26   GLUCOSE 209* 146* 115* 144* 125*  BUN 30* 37* 29* 43* 40*  CREATININE 6.78* 7.74* 6.22* 7.80* 7.22*  CALCIUM 7.0* 7.3* 8.3* 8.1* 8.2*  MG  --   --  1.5*  --   --   PHOS  --   --  5.0* 5.5*  --    GFR: Estimated Creatinine Clearance: 10.3 mL/min (A) (by C-G formula based on SCr of 7.22 mg/dL (H)). Liver Function Tests: Recent Labs  Lab 03/23/20 0929  ALBUMIN 3.3*   No results for input(s): LIPASE, AMYLASE in the last 168 hours. No results for input(s): AMMONIA in the last 168 hours. Coagulation Profile: Recent Labs   Lab 03/20/20 0904  INR 1.1   Cardiac Enzymes: No results for input(s): CKTOTAL, CKMB, CKMBINDEX, TROPONINI in the last 168 hours. BNP (last 3 results) No results for input(s): PROBNP in the last 8760 hours. HbA1C: No results for input(s): HGBA1C in the last 72 hours. CBG: Recent Labs  Lab 03/24/20 0851 03/24/20 1201 03/24/20 1612 03/24/20 2138 03/25/20 0756  GLUCAP 138* 211* 154* 152* 145*   Lipid Profile: No results for input(s): CHOL, HDL, LDLCALC, TRIG, CHOLHDL, LDLDIRECT in the last 72 hours. Thyroid Function Tests: No results for input(s): TSH, T4TOTAL, FREET4, T3FREE, THYROIDAB in the last 72 hours. Anemia Panel: No results for input(s): VITAMINB12, FOLATE, FERRITIN, TIBC, IRON, RETICCTPCT in the last 72 hours. Sepsis Labs: No results for input(s): PROCALCITON, LATICACIDVEN in the last 168 hours.  Recent Results (from the past 240 hour(s))  SARS CORONAVIRUS 2 (TAT 6-24 HRS) Nasopharyngeal Nasopharyngeal Swab     Status: None   Collection Time: 03/15/20 10:41 PM   Specimen: Nasopharyngeal Swab  Result Value Ref Range Status   SARS Coronavirus 2 NEGATIVE NEGATIVE Final    Comment: (NOTE) SARS-CoV-2 target nucleic acids are NOT DETECTED.  The SARS-CoV-2 RNA is generally detectable in upper and lower respiratory specimens during the acute phase of infection. Negative results do not preclude SARS-CoV-2 infection, do not rule out co-infections with other pathogens, and should not be used as the sole basis for treatment or other patient management decisions. Negative results must be combined with clinical observations, patient history, and epidemiological information. The expected result is Negative.  Fact Sheet for Patients: SugarRoll.be  Fact Sheet for Healthcare Providers: https://www.woods-mathews.com/  This test is not yet approved or cleared by the [place] FDA and  has been authorized for detection and/or  diagnosis of SARS-CoV-2 by FDA under an Emergency Use Authorization (EUA). This EUA will remain  in effect (meaning this test can be used) for the duration of the COVID-19 declaration under Se ction 564(b)(1) of the Act, 21 U.S.C. section 360bbb-3(b)(1), unless the authorization is terminated or revoked sooner.  Performed at [facility], [place] [address] [place], [place] [address]   SARS Coronavirus 2 by RT PCR (hospital order, performed in [facility] hospital lab) Nasopharyngeal Nasopharyngeal Swab     Status: None   Collection Time: 03/20/20  9:04 AM   Specimen: Nasopharyngeal Swab  Result Value Ref Range Status   SARS Coronavirus 2 NEGATIVE NEGATIVE Final    Comment: (NOTE) SARS-CoV-2 target nucleic acids are NOT DETECTED.  The SARS-CoV-2  RNA is generally detectable in upper and lower respiratory specimens during the acute phase of infection. The lowest concentration of SARS-CoV-2 viral copies this assay can detect is 250 copies / mL. A negative result does not preclude SARS-CoV-2 infection and should not be used as the sole basis for treatment or other patient management decisions.  A negative result may occur with improper specimen collection / handling, submission of specimen other than nasopharyngeal swab, presence of viral mutation(s) within the areas targeted by this assay, and inadequate number of viral copies (<250 copies / mL). A negative result must be combined with clinical observations, patient history, and epidemiological information.  Fact Sheet for Patients:   StrictlyIdeas.no  Fact Sheet for Healthcare Providers: BankingDealers.co.za  This test is not yet approved or  cleared by the [place] FDA and has been authorized for detection and/or diagnosis of SARS-CoV-2 by FDA under an Emergency Use Authorization (EUA).  This EUA will remain in effect (meaning this test can be used) for the duration of  the COVID-19 declaration under Section 564(b)(1) of the Act, 21 U.S.C. section 360bbb-3(b)(1), unless the authorization is terminated or revoked sooner.  Performed at [facility], [address] [place], [place] [address]      Radiology Studies: EEG  Result Date: 03/24/2020 Lora Havens, MD     03/24/2020  6:56 PM Patient Name: Doni Widmer MRN: [id number] Epilepsy Attending: Lora Havens Referring Physician/Provider: Dr Amie Portland Date: 03/24/2020 Duration: 20.55 mins Patient history: 69 year old man with history of strokes in seizures in the past admitted with a traumatic subarachnoid as well as traumatic hemorrhagic left temporal brain contusion with concern of mildly worse slurred speech. EEG to evaluate for seizure Level of alertness: Awake AEDs during EEG study: LEV, GBP Technical aspects: This EEG study was done with scalp electrodes positioned according to the 10-20 International system of electrode placement. Electrical activity was acquired at a sampling rate of 500Hz  and reviewed with a high frequency filter of 70Hz  and a low frequency filter of 1Hz . EEG data were recorded continuously and digitally stored. Description: The posterior dominant rhythm consists of 8 Hz activity of moderate voltage (25-35 uV) seen predominantly in posterior head regions, symmetric and reactive to eye opening and eye closing. Physiologic photic driving was not seen during photic stimulation.  Hyperventilation was not performed.   IMPRESSION: This study is within normal limits. No seizures or epileptiform discharges were seen throughout the recording. Lora Havens   CT HEAD WO CONTRAST  Result Date: 03/24/2020 CLINICAL DATA:  Traumatic brain injury (TBI), new cognitive or neuro deficit EXAM: CT HEAD WITHOUT CONTRAST TECHNIQUE: Contiguous axial images were obtained from the base of the skull through the vertex without intravenous contrast. COMPARISON:  03/23/2020 and prior.  FINDINGS: Brain: Left cerebral convexity subdural hematoma measuring up to 3 mm is grossly unchanged. Left temporal intraparenchymal hemorrhage measuring 2.6 x 2.0 cm is similar to prior exam (2:11). Small volume left cerebral convexity subarachnoid hemorrhage is unchanged. No intraventricular extension. No new focal hypodensity. No mass lesion. No midline shift, ventriculomegaly. Mild cerebral atrophy with ex vacuo dilatation. Chronic microvascular ischemic changes. Vascular: No hyperdense vessel or unexpected calcification. Skull: Negative for fracture or focal lesion. Sinuses/Orbits: Normal orbits. Opacification of the left frontal, ethmoid and maxillary sinus with chronic sequela is unchanged. Other: None. IMPRESSION: Left temporal intraparenchymal hemorrhage measuring 2.6 cm is unchanged. Small left cerebral convexity subdural hematoma with subarachnoid hemorrhage, similar prior exam. Electronically Signed   By:  Primitivo Gauze M.D.   On: 03/24/2020 17:14   CT HEAD WO CONTRAST  Result Date: 03/23/2020 CLINICAL DATA:  70 year old male with head trauma. Follow-up intracranial hemorrhage. EXAM: CT HEAD WITHOUT CONTRAST TECHNIQUE: Contiguous axial images were obtained from the base of the skull through the vertex without intravenous contrast. COMPARISON:  Head CT dated 03/20/2020. FINDINGS: Brain: Left-sided subdural hemorrhage measuring approximately 4 mm in thickness over the left occipital lobe and 2 mm over the left temporal lobe and without significant interval change. Left temporal intraparenchymal hemorrhage with small subarachnoid components not significantly change. There has been however interval development of mild edema surrounding the left temporal intraparenchymal bleed. There is associated mild mass effect. No significant midline shift. No new bleed. There is mild age-related atrophy and moderate chronic microvascular ischemic changes. Small old lacunar infarct in the pons. Vascular: No  hyperdense vessel or unexpected calcification. Skull: No acute calvarial pathology. Sinuses/Orbits: There is complete opacification of the left frontal, ethmoid, and maxillary sinus with remodeling of the medial and lateral walls of the left maxillary sinus as seen previously. No air-fluid level. The mastoid air cells are clear. Other: None IMPRESSION: 1. No significant interval change in the left sided intracranial bleed compared to prior CT. No new hemorrhage. Continued follow-up recommended. 2. No significant midline shift. 3. Age-related atrophy and chronic microvascular ischemic changes. 4. Chronic left paranasal sinus disease. Electronically Signed   By: Anner Crete M.D.   On: 03/23/2020 16:07    Scheduled Meds: . amoxicillin-clavulanate  1 tablet Oral Q24H  . atorvastatin  10 mg Oral Daily  . b complex-vitamin c-folic acid  1 tablet Oral Daily  . calcium acetate  1,334 mg Oral TID WC  . Chlorhexidine Gluconate Cloth  6 each Topical Daily  . gabapentin  100 mg Oral BID  . insulin aspart  0-6 Units Subcutaneous TID WC  . levETIRAcetam  500 mg Oral BID  . levothyroxine  200 mcg Oral QAC breakfast  . lidocaine-prilocaine  1 application Topical Q T,Th,Sa-HD  . lipase/protease/amylase  24,000 Units Oral TID WC  . metoprolol succinate  50 mg Oral Daily  . pantoprazole  40 mg Oral Daily  . sodium chloride flush  3 mL Intravenous Q12H  . sodium chloride flush  3 mL Intravenous Q12H  . traZODone  100 mg Oral QHS   Continuous Infusions:   LOS: 3 days    Time spent: 25 mins    Max Sane, MD [facility]   If 7PM-7AM, please contact night-coverage

## 2020-03-25 NOTE — Progress Notes (Signed)
Mobility Specialist - Progress Note   03/25/20 1438  Mobility  Activity Refused mobility  Mobility performed by Mobility specialist    Pt refused session at this time. No reason specified. Will re-attempt at a later date/time.    Kharter Sestak Mobility Specialist  03/25/20, 2:39 PM

## 2020-03-25 NOTE — Progress Notes (Signed)
Neurology Progress Note   S:// Seen and examined. He was in dialysis at the time of this examination. Speech continues to be dysarthric    O:// Current vital signs: BP (!) 170/74   Pulse 69   Temp 97.9 F (36.6 C) (Oral)   Resp 18   Ht 6' (1.829 m)   Wt 81.2 kg   SpO2 98%   BMI 24.29 kg/m  Vital signs in last 24 hours: Temp:  [97.7 F (36.5 C)-99.1 F (37.3 C)] 97.9 F (36.6 C) (01/28 0945) Pulse Rate:  [66-75] 69 (01/28 1230) Resp:  [14-19] 18 (01/28 1230) BP: (141-175)/(61-77) 170/74 (01/28 1230) SpO2:  [96 %-100 %] 98 % (01/28 0753) Weight:  [81.2 kg] 81.2 kg (01/28 0551) General: Awake alert in no distress HEENT: Bruising around the right eye and forehead, dry oral mucous membranes. Lungs: Clear Abdomen nondistended nontender Extremities warm well perfused Cardiovascular: Regular rhythm Neurological exam Awake, alert, oriented to self and the fact that is in the hospital. Could not tell me the date.  Could not tell me the month.  Could not tell me his correct age. Speech is mildly dysarthric. Also seem to be having word finding difficulty today. He is able to follow commands. Is able to repeat sentences Cranial nerves: 2-12 grossly intact Motor exam: With no drift in any of the 4 extremities Sensory exam intact to light touch Coordination with no gross dysmetria.  Medications  Current Facility-Administered Medications:  .  acetaminophen (TYLENOL) tablet 650 mg, 650 mg, Oral, Q6H PRN, 650 mg at 03/22/20 0954 **OR** acetaminophen (TYLENOL) suppository 650 mg, 650 mg, Rectal, Q6H PRN, Agbata, Tochukwu, MD .  amoxicillin-clavulanate (AUGMENTIN) 500-125 MG per tablet 500 mg, 1 tablet, Oral, Q24H, Agbata, Tochukwu, MD, 500 mg at 03/24/20 1717 .  atorvastatin (LIPITOR) tablet 10 mg, 10 mg, Oral, Daily, Agbata, Tochukwu, MD, 10 mg at 03/24/20 0955 .  b complex-vitamin c-folic acid (NEPHRO-VITE) tablet 1 tablet, 1 tablet, Oral, Daily, Agbata, Tochukwu, MD, 1 tablet  at 03/24/20 0956 .  calcium acetate (PHOSLO) capsule 1,334 mg, 1,334 mg, Oral, TID WC, Agbata, Tochukwu, MD, 1,334 mg at 03/24/20 1717 .  Chlorhexidine Gluconate Cloth 2 % PADS 6 each, 6 each, Topical, Daily, Max Sane, MD, 6 each at 03/24/20 0956 .  gabapentin (NEURONTIN) capsule 100 mg, 100 mg, Oral, BID, Agbata, Tochukwu, MD, 100 mg at 03/24/20 2131 .  insulin aspart (novoLOG) injection 0-6 Units, 0-6 Units, Subcutaneous, TID WC, Agbata, Tochukwu, MD, 1 Units at 03/24/20 1716 .  levETIRAcetam (KEPPRA) tablet 500 mg, 500 mg, Oral, BID, Agbata, Tochukwu, MD, 500 mg at 03/24/20 2131 .  levothyroxine (SYNTHROID) tablet 200 mcg, 200 mcg, Oral, QAC breakfast, Agbata, Tochukwu, MD, 200 mcg at 03/25/20 0556 .  lidocaine-prilocaine (EMLA) cream 1 application, 1 application, Topical, Q T,Th,Sa-HD, Agbata, Tochukwu, MD .  lipase/protease/amylase (CREON) capsule 24,000 Units, 24,000 Units, Oral, TID WC, Agbata, Tochukwu, MD, 24,000 Units at 03/24/20 1717 .  lisinopril (ZESTRIL) tablet 5 mg, 5 mg, Oral, Daily, Manuella Ghazi, Vipul, MD .  metoprolol succinate (TOPROL-XL) 24 hr tablet 50 mg, 50 mg, Oral, Daily, Agbata, Tochukwu, MD, 50 mg at 03/24/20 0956 .  pantoprazole (PROTONIX) EC tablet 40 mg, 40 mg, Oral, Daily, Agbata, Tochukwu, MD, 40 mg at 03/24/20 0956 .  sodium chloride flush (NS) 0.9 % injection 3 mL, 3 mL, Intravenous, Q12H, Agbata, Tochukwu, MD, 3 mL at 03/24/20 2132 .  sodium chloride flush (NS) 0.9 % injection 3 mL, 3 mL, Intravenous, Q12H, Corey Skains,  MD, 3 mL at 03/24/20 2132 .  traZODone (DESYREL) tablet 100 mg, 100 mg, Oral, QHS, Agbata, Tochukwu, MD, 100 mg at 03/24/20 2131 Labs CBC    Component Value Date/Time   WBC 5.7 03/25/2020 0605   RBC 3.22 (L) 03/25/2020 0605   HGB 10.2 (L) 03/25/2020 0605   HGB 14.2 08/08/2013 1947   HCT 30.8 (L) 03/25/2020 0605   HCT 42.1 08/08/2013 1947   PLT 148 (L) 03/25/2020 0605   PLT 144 (L) 08/08/2013 1947   MCV 95.7 03/25/2020 0605   MCV 92  08/08/2013 1947   MCH 31.7 03/25/2020 0605   MCHC 33.1 03/25/2020 0605   RDW 13.7 03/25/2020 0605   RDW 12.9 08/08/2013 1947   LYMPHSABS 1.1 03/15/2020 1653   MONOABS 0.6 03/15/2020 1653   EOSABS 0.1 03/15/2020 1653   BASOSABS 0.1 03/15/2020 1653    CMP     Component Value Date/Time   NA 136 03/25/2020 0605   NA 138 08/08/2013 1947   K 4.3 03/25/2020 0605   K 3.9 08/08/2013 1947   CL 97 (L) 03/25/2020 0605   CL 107 08/08/2013 1947   CO2 26 03/25/2020 0605   CO2 21 08/08/2013 1947   GLUCOSE 125 (H) 03/25/2020 0605   GLUCOSE 100 (H) 08/08/2013 1947   BUN 40 (H) 03/25/2020 0605   BUN 32 (H) 08/08/2013 1947   CREATININE 7.22 (H) 03/25/2020 0605   CREATININE 2.04 (H) 08/08/2013 1947   CALCIUM 8.2 (L) 03/25/2020 0605   CALCIUM 9.1 08/08/2013 1947   PROT 6.8 03/17/2020 0340   PROT 6.7 08/08/2013 1947   ALBUMIN 3.3 (L) 03/23/2020 0929   ALBUMIN 3.4 08/08/2013 1947   AST 35 03/17/2020 0340   AST 30 08/08/2013 1947   ALT 30 03/17/2020 0340   ALT 22 08/08/2013 1947   ALKPHOS 110 03/17/2020 0340   ALKPHOS 72 08/08/2013 1947   BILITOT 1.0 03/17/2020 0340   BILITOT 0.3 08/08/2013 1947   GFRNONAA 8 (L) 03/25/2020 0605   GFRNONAA 33 (L) 08/08/2013 1947   GFRAA 10 (L) 08/20/2019 1300   GFRAA 38 (L) 08/08/2013 1947    glycosylated hemoglobin  Lipid Panel     Component Value Date/Time   CHOL 130 03/16/2019 0533   TRIG 207 (H) 03/16/2019 0533   HDL 41 03/16/2019 0533   CHOLHDL 3.2 03/16/2019 0533   VLDL 41 (H) 03/16/2019 0533   LDLCALC 48 03/16/2019 0533     Imaging I have reviewed images in epic and the results pertinent to this consultation are: Repeat CT head with unchanged left temporal hematoma and subarachnoid hemorrhage.  EEG done yesterday normal  Assessment: 70 year old with history of strokes and seizures in the past, COPD, depression, diabetes, hypertension hyperlipidemia admitted with a traumatic subarachnoid as well as traumatic hemorrhagic left temporal  brain contusion with concern for mildly worse speech yesterday.  His symptoms are current likely waxing and waning secondary to his bleed and the location in the temporal lobe. EEG was unremarkable for any ongoing seizures. I think his symptoms will improve as the bleed resolves. At this point, I do not think there is any need for surgical intervention.  Impression: Traumatic subarachnoid hemorrhage Traumatic brain contusion History of seizures History of strokes Concern for worsening of speech-likely secondary to waxing and waning electrographic abnormalities due to the brain contusion/subarachnoid  Recommendations: Continue current dose of Keppra Low threshold for repeat CT head if he has significant worsening Continue with home dose of gabapentin Maintain seizure precautions PT  OT Discussed my plan with Dr. Manuella Ghazi Impression neurology will be available as needed.  Please call with questions.  -- Amie Portland, MD Neurologist [facility] Pager: [phone]

## 2020-03-25 NOTE — Progress Notes (Signed)
OT Cancellation Note  Patient Details Name: Kyle [name] MRN: [id number] DOB: [birth year]   Cancelled Treatment:    Reason Eval/Treat Not Completed: Patient at procedure or test/ unavailable. Pt out of room upon attempt. Will re-attempt at later date/time as available and medically appropriate.  Jeni Salles, MPH, MS, OTR/L ascom [phone] 03/25/20, 5:22 PM

## 2020-03-25 NOTE — Progress Notes (Signed)
OT Cancellation Note  Patient Details Name: Kyle [name] MRN: [id number] DOB: Jan 07, 1951   Cancelled Treatment:    Reason Eval/Treat Not Completed: Patient at procedure or test/ unavailable. Pt out of room. Will re-attempt OT tx at later date/time as available and medically appropriate.   Jeni Salles, MPH, MS, OTR/L ascom [phone] 03/25/20, 12:54 PM

## 2020-03-25 NOTE — Progress Notes (Signed)
[facility]  ROUNDING NOTE   Subjective:    Seen and examined on hemodialysis treatment. Tolerating treatment well. In bed.   Patient met with palliative care yesterday. However he was unable to give me details of the conversation.   EEG yesterday - normal reading    HEMODIALYSIS FLOWSHEET:  Blood Flow Rate (mL/min): 400 mL/min Arterial Pressure (mmHg): -180 mmHg Venous Pressure (mmHg): 180 mmHg Transmembrane Pressure (mmHg): 70 mmHg Ultrafiltration Rate (mL/min): 800 mL/min Dialysate Flow Rate (mL/min): 600 ml/min Conductivity: Machine : 14.1 Conductivity: Machine : 14.1 Dialysis Fluid Bolus: Normal Saline Bolus Amount (mL): 200 mL     Objective:  Vital signs in last 24 hours:  Temp:  [97.7 F (36.5 C)-99.1 F (37.3 C)] 97.9 F (36.6 C) (01/28 0945) Pulse Rate:  [66-75] 74 (01/28 1200) Resp:  [14-19] 19 (01/28 1200) BP: (141-175)/(61-77) 170/72 (01/28 1200) SpO2:  [96 %-100 %] 98 % (01/28 0753) Weight:  [81.2 kg] 81.2 kg (01/28 0551)  Weight change:  Filed Weights   03/22/20 0100 03/23/20 0408 03/25/20 0551  Weight: 80.7 kg 80.8 kg 81.2 kg    Intake/Output: I/O last 3 completed shifts: In: 46 [P.O.:960] Out: 0    Intake/Output this shift:  Total I/O In: 480 [P.O.:480] Out: -   Physical Exam: General: NAD, laying in bed  Head: Right ecchymosis+. Moist oral mucosal membranes  Eyes: + large ecchymosis around right eye   Neck: Supple, trachea midline  Lungs:  Clear to auscultation  Heart: Regular rate and rhythm  Abdomen:  Soft, nontender,   Extremities:  No peripheral edema.  Neurologic: Alert and oriented to self and place  Skin: abrasions on his right arm   Access: Left upper extremity AVF, +thrill      Basic Metabolic Panel: Recent Labs  Lab 03/20/20 0717 03/21/20 0340 03/22/20 0631 03/23/20 0929 03/25/20 0605  NA 138 137 138 134* 136  K 4.3 4.6 4.6 4.9 4.3  CL 98 96* 97* 94* 97*  CO2 _0 GLUCOSE 209* 146*  115* 144* 125*  BUN 30* 37* 29* 43* 40*  CREATININE 6.78* 7.74* 6.22* 7.80* 7.22*  CALCIUM 7.0* 7.3* 8.3* 8.1* 8.2*  MG  --   --  1.5*  --   --   PHOS  --   --  5.0* 5.5*  --     Liver Function Tests: Recent Labs  Lab 03/23/20 0929  ALBUMIN 3.3*   No results for input(s): LIPASE, AMYLASE in the last 168 hours. No results for input(s): AMMONIA in the last 168 hours.  CBC: Recent Labs  Lab 03/20/20 0717 03/21/20 0340 03/22/20 0631 03/23/20 0929 03/25/20 0605  WBC 5.4 5.8 6.6 6.2 5.7  HGB 10.8* 10.0* 10.4* 10.3* 10.2*  HCT 33.4* 31.7* 32.3* 32.6* 30.8*  MCV 99.4 99.1 97.9 97.3 95.7  PLT 140* 142* 149* 129* 148*    Cardiac Enzymes: No results for input(s): CKTOTAL, CKMB, CKMBINDEX, TROPONINI in the last 168 hours.  BNP: Invalid input(s): POCBNP  CBG: Recent Labs  Lab 03/24/20 0851 03/24/20 1201 03/24/20 1612 03/24/20 2138 03/25/20 0756  GLUCAP 138* 211* 154* 152* 145*    Microbiology: Results for orders placed or performed during the hospital encounter of 03/20/20  SARS Coronavirus 2 by RT PCR (hospital order, performed in [facility] hospital lab) Nasopharyngeal Nasopharyngeal Swab     Status: None   Collection Time: 03/20/20  9:04 AM   Specimen: Nasopharyngeal Swab  Result Value Ref Range Status  SARS Coronavirus 2 NEGATIVE NEGATIVE Final    Comment: (NOTE) SARS-CoV-2 target nucleic acids are NOT DETECTED.  The SARS-CoV-2 RNA is generally detectable in upper and lower respiratory specimens during the acute phase of infection. The lowest concentration of SARS-CoV-2 viral copies this assay can detect is 250 copies / mL. A negative result does not preclude SARS-CoV-2 infection and should not be used as the sole basis for treatment or other patient management decisions.  A negative result may occur with improper specimen collection / handling, submission of specimen other than nasopharyngeal swab, presence of viral mutation(s) within the areas targeted by  this assay, and inadequate number of viral copies (<250 copies / mL). A negative result must be combined with clinical observations, patient history, and epidemiological information.  Fact Sheet for Patients:   StrictlyIdeas.no  Fact Sheet for Healthcare Providers: BankingDealers.co.za  This test is not yet approved or  cleared by the [place] FDA and has been authorized for detection and/or diagnosis of SARS-CoV-2 by FDA under an Emergency Use Authorization (EUA).  This EUA will remain in effect (meaning this test can be used) for the duration of the COVID-19 declaration under Section 564(b)(1) of the Act, 21 U.S.C. section 360bbb-3(b)(1), unless the authorization is terminated or revoked sooner.  Performed at [facility], [address] [place], [place] [address]     Coagulation Studies: No results for input(s): LABPROT, INR in the last 72 hours.  Urinalysis: No results for input(s): COLORURINE, LABSPEC, PHURINE, GLUCOSEU, HGBUR, BILIRUBINUR, KETONESUR, PROTEINUR, UROBILINOGEN, NITRITE, LEUKOCYTESUR in the last 72 hours.  Invalid input(s): APPERANCEUR    Imaging: EEG  Result Date: 03/24/2020 Lora Havens, MD     03/24/2020  6:56 PM Patient Name: Kyle [name] MRN: [id number] Epilepsy Attending: Lora Havens Referring Physician/Provider: Dr Amie [place] Date: 03/24/2020 Duration: 20.55 mins Patient history: 70 year old man with history of strokes in seizures in the past admitted with a traumatic subarachnoid as well as traumatic hemorrhagic left temporal brain contusion with concern of mildly worse slurred speech. EEG to evaluate for seizure Level of alertness: Awake AEDs during EEG study: LEV, GBP Technical aspects: This EEG study was done with scalp electrodes positioned according to the 10-20 International system of electrode placement. Electrical activity was acquired at a sampling rate of 500Hz and  reviewed with a high frequency filter of 70Hz and a low frequency filter of 1Hz. EEG data were recorded continuously and digitally stored. Description: The posterior dominant rhythm consists of 8 Hz activity of moderate voltage (25-35 uV) seen predominantly in posterior head regions, symmetric and reactive to eye opening and eye closing. Physiologic photic driving was not seen during photic stimulation.  Hyperventilation was not performed.   IMPRESSION: This study is within normal limits. No seizures or epileptiform discharges were seen throughout the recording. Lora Havens   CT HEAD WO CONTRAST  Result Date: 03/24/2020 CLINICAL DATA:  Traumatic brain injury (TBI), new cognitive or neuro deficit EXAM: CT HEAD WITHOUT CONTRAST TECHNIQUE: Contiguous axial images were obtained from the base of the skull through the vertex without intravenous contrast. COMPARISON:  03/23/2020 and prior. FINDINGS: Brain: Left cerebral convexity subdural hematoma measuring up to 3 mm is grossly unchanged. Left temporal intraparenchymal hemorrhage measuring 2.6 x 2.0 cm is similar to prior exam (2:11). Small volume left cerebral convexity subarachnoid hemorrhage is unchanged. No intraventricular extension. No new focal hypodensity. No mass lesion. No midline shift, ventriculomegaly. Mild cerebral atrophy with ex vacuo dilatation. Chronic microvascular ischemic  changes. Vascular: No hyperdense vessel or unexpected calcification. Skull: Negative for fracture or focal lesion. Sinuses/Orbits: Normal orbits. Opacification of the left frontal, ethmoid and maxillary sinus with chronic sequela is unchanged. Other: None. IMPRESSION: Left temporal intraparenchymal hemorrhage measuring 2.6 cm is unchanged. Small left cerebral convexity subdural hematoma with subarachnoid hemorrhage, similar prior exam. Electronically Signed   By: Primitivo Gauze M.D.   On: 03/24/2020 17:14   CT HEAD WO CONTRAST  Result Date: 03/23/2020 CLINICAL DATA:   70 year old male with head trauma. Follow-up intracranial hemorrhage. EXAM: CT HEAD WITHOUT CONTRAST TECHNIQUE: Contiguous axial images were obtained from the base of the skull through the vertex without intravenous contrast. COMPARISON:  Head CT dated 03/20/2020. FINDINGS: Brain: Left-sided subdural hemorrhage measuring approximately 4 mm in thickness over the left occipital lobe and 2 mm over the left temporal lobe and without significant interval change. Left temporal intraparenchymal hemorrhage with small subarachnoid components not significantly change. There has been however interval development of mild edema surrounding the left temporal intraparenchymal bleed. There is associated mild mass effect. No significant midline shift. No new bleed. There is mild age-related atrophy and moderate chronic microvascular ischemic changes. Small old lacunar infarct in the pons. Vascular: No hyperdense vessel or unexpected calcification. Skull: No acute calvarial pathology. Sinuses/Orbits: There is complete opacification of the left frontal, ethmoid, and maxillary sinus with remodeling of the medial and lateral walls of the left maxillary sinus as seen previously. No air-fluid level. The mastoid air cells are clear. Other: None IMPRESSION: 1. No significant interval change in the left sided intracranial bleed compared to prior CT. No new hemorrhage. Continued follow-up recommended. 2. No significant midline shift. 3. Age-related atrophy and chronic microvascular ischemic changes. 4. Chronic left paranasal sinus disease. Electronically Signed   By: Anner Crete M.D.   On: 03/23/2020 16:07     Medications:    . amoxicillin-clavulanate  1 tablet Oral Q24H  . atorvastatin  10 mg Oral Daily  . b complex-vitamin c-folic acid  1 tablet Oral Daily  . calcium acetate  1,334 mg Oral TID WC  . Chlorhexidine Gluconate Cloth  6 each Topical Daily  . gabapentin  100 mg Oral BID  . insulin aspart  0-6 Units Subcutaneous  TID WC  . levETIRAcetam  500 mg Oral BID  . levothyroxine  200 mcg Oral QAC breakfast  . lidocaine-prilocaine  1 application Topical Q T,Th,Sa-HD  . lipase/protease/amylase  24,000 Units Oral TID WC  . lisinopril  5 mg Oral Daily  . metoprolol succinate  50 mg Oral Daily  . pantoprazole  40 mg Oral Daily  . sodium chloride flush  3 mL Intravenous Q12H  . sodium chloride flush  3 mL Intravenous Q12H  . traZODone  100 mg Oral QHS   acetaminophen **OR** acetaminophen  Assessment/ Plan:  Kyle [name] is a 70 y.o. white male with end stage renal disease on hemodialysis, CVA with residual left sided weakness, CVA, hypertension, seizure disorder, diabetes mellitus type II, hypertension who was admitted to [facility] on 03/20/2020 for Syncope and collapse [R55] Subdural hematoma ([place]) [S06.5X9A] Fall, initial encounter [W19.XXXA] Syncope, unspecified syncope type [R55] Subarachnoid hematoma with loss of consciousness, initial encounter [facility]) [S06.6X9A]  Patient was found by his wife after a fall. Patient reports multiple falls at home. Found to have subarachnoid hemorrhage and cerebral contusion. Patient disoriented but mental status is improving. Synocope work up by cardiology negative.   [facility] MWF [address] Left AVF 79.5kg  1.  ESRD on HD MWF. Tolerating hemodialysis treatment.   2.  Anemia of chronic kidney disease: hemoglobin 10.2 EPO being held due to hematoma  3.  Secondary hyperparathyroidism. Calcium and phosphorus at goal.  -  Continue calcium acetate with meals  4.  Hypertension.  blood pressures mostly at goal.  - metoprolol and lisinopril.   LOS: 3 Kyle [name] 1/28/20221:14 PM

## 2020-03-26 DIAGNOSIS — E039 Hypothyroidism, unspecified: Secondary | ICD-10-CM | POA: Diagnosis not present

## 2020-03-26 LAB — HEPATITIS B SURFACE ANTIBODY, QUANTITATIVE: Hep B S AB Quant (Post): 21.5 m[IU]/mL (ref >9.9)

## 2020-03-26 LAB — GLUCOSE, CAPILLARY
Glucose-Capillary: 123 mg/dL — ABNORMAL HIGH (ref 70–99)
Glucose-Capillary: 131 mg/dL — ABNORMAL HIGH (ref 70–99)
Glucose-Capillary: 193 mg/dL — ABNORMAL HIGH (ref 70–99)
Glucose-Capillary: 178 mg/dL — ABNORMAL HIGH (ref 70–99)
Glucose-Capillary: 133 mg/dL — ABNORMAL HIGH (ref 70–99)

## 2020-03-26 NOTE — Progress Notes (Signed)
Kyle [name]  MRN: [id number]  DOB/AGE: 1950-11-25 70 y.o.  Primary Care Physician:Sparks, Leonie Douglas, MD  Admit date: 03/20/2020  Chief Complaint:  Chief Complaint  Patient presents with  . Fall    S-Pt presented on  03/20/2020 with  Chief Complaint  Patient presents with  . Fall  . Patient remains confused, patient does not offer any specific complaints  Medications . amoxicillin-clavulanate  1 tablet Oral Q24H  . atorvastatin  10 mg Oral Daily  . b complex-vitamin c-folic acid  1 tablet Oral Daily  . calcium acetate  1,334 mg Oral TID WC  . Chlorhexidine Gluconate Cloth  6 each Topical Daily  . gabapentin  100 mg Oral BID  . insulin aspart  0-6 Units Subcutaneous TID WC  . levETIRAcetam  500 mg Oral BID  . levothyroxine  200 mcg Oral QAC breakfast  . lidocaine-prilocaine  1 application Topical Q T,Th,Sa-HD  . lipase/protease/amylase  24,000 Units Oral TID WC  . lisinopril  5 mg Oral Daily  . metoprolol succinate  50 mg Oral Daily  . pantoprazole  40 mg Oral Daily  . sodium chloride flush  3 mL Intravenous Q12H  . sodium chloride flush  3 mL Intravenous Q12H  . traZODone  100 mg Oral QHS         ROS: Unable to get any data   Physical Exam: Vital signs in last 24 hours: Temp:  [98 F (36.7 C)-99 F (37.2 C)] 98.4 F (36.9 C) (01/29 0803) Pulse Rate:  [66-80] 76 (01/29 0803) Resp:  [14-19] 18 (01/29 0803) BP: (144-175)/(61-110) 156/72 (01/29 0803) SpO2:  [95 %-100 %] 99 % (01/29 0803) Weight:  [80.7 kg] 80.7 kg (01/29 0422) Weight change: -0.544 kg Last BM Date: 03/25/20  Intake/Output from previous day: 01/28 0701 - 01/29 0700 In: 480 [P.O.:480] Out: 1503  No intake/output data recorded.   Physical Exam:  General- pt is awake, but confused   Resp- No acute REsp distress, CTA B/L NO Rhonchi  CVS- S1S2 regular in rate and rhythm  GIT- BS+, soft, Non tender , Non distended  EXT- No LE Edema,  No Cyanosis  Access- LUE AVF  Lab  Results:  CBC  Recent Labs    03/25/20 0605  WBC 5.7  HGB 10.2*  HCT 30.8*  PLT 148*    BMET  Recent Labs    03/25/20 0605  NA 136  K 4.3  CL 97*  CO2 26  GLUCOSE 125*  BUN 40*  CREATININE 7.22*  CALCIUM 8.2*      Most recent Creatinine trend  Lab Results  Component Value Date   CREATININE 7.22 (H) 03/25/2020   CREATININE 7.80 (H) 03/23/2020   CREATININE 6.22 (H) 03/22/2020      MICRO   Recent Results (from the past 240 hour(s))  SARS Coronavirus 2 by RT PCR (hospital order, performed in [facility] hospital lab) Nasopharyngeal Nasopharyngeal Swab     Status: None   Collection Time: 03/20/20  9:04 AM   Specimen: Nasopharyngeal Swab  Result Value Ref Range Status   SARS Coronavirus 2 NEGATIVE NEGATIVE Final    Comment: (NOTE) SARS-CoV-2 target nucleic acids are NOT DETECTED.  The SARS-CoV-2 RNA is generally detectable in upper and lower respiratory specimens during the acute phase of infection. The lowest concentration of SARS-CoV-2 viral copies this assay can detect is 250 copies / mL. A negative result does not preclude SARS-CoV-2 infection and should not be used as the sole basis for  treatment or other patient management decisions.  A negative result may occur with improper specimen collection / handling, submission of specimen other than nasopharyngeal swab, presence of viral mutation(s) within the areas targeted by this assay, and inadequate number of viral copies (<250 copies / mL). A negative result must be combined with clinical observations, patient history, and epidemiological information.  Fact Sheet for Patients:   StrictlyIdeas.no  Fact Sheet for Healthcare Providers: BankingDealers.co.za  This test is not yet approved or  cleared by the [place] FDA and has been authorized for detection and/or diagnosis of SARS-CoV-2 by FDA under an Emergency Use Authorization (EUA).  This EUA will  remain in effect (meaning this test can be used) for the duration of the COVID-19 declaration under Section 564(b)(1) of the Act, 21 U.S.C. section 360bbb-3(b)(1), unless the authorization is terminated or revoked sooner.  Performed at [facility], [address] [place], [place] [address]          Impression:  Mr. Daelon Dunivan is a 70 y.o. white male with end stage renal disease on hemodialysis, CVA with residual left sided weakness, CVA, hypertension, seizure disorder, diabetes mellitus type II, hypertension who was admitted to [facility] on 03/20/2020 for Syncope and collapse [R55] Subdural hematoma ([place]) [S06.5X9A] Fall, initial encounter [W19.XXXA] Syncope, unspecified syncope type [R55] Subarachnoid hematoma with loss of consciousness, initial encounter [facility]) [S06.6X9A]  Patient was found by his wife after a fall. Patient reports multiple falls at home. Found to have subarachnoid hemorrhage and cerebral contusion. Patient disoriented but mental status is improving. Synocope work up by cardiology negative.   [facility] M[facility] [address] Left AVF 79.5kg   1)Renal    End-stage renal disease Patient is on hemodialysis Patient is on Monday Wednesday Friday schedule Patient was last dialyzed yesterday  2)HTN    Blood pressure is stable    3)Anemia of chronic disease  CBC Latest Ref Rng & Units 03/25/2020 03/23/2020 03/22/2020  WBC 4.0 - 10.5 K/uL 5.7 6.2 6.6  Hemoglobin 13.0 - 17.0 g/dL 10.2(L) 10.3(L) 10.4(L)  Hematocrit 39.0 - 52.0 % 30.8(L) 32.6(L) 32.3(L)  Platelets 150 - 400 K/uL 148(L) 129(L) 149(L)       HGb at goal (9--11)   4) Secondary hyperparathyroidism -CKD Mineral-Bone Disorder    Lab Results  Component Value Date   PTH 91 (H) 03/13/2019   CALCIUM 8.2 (L) 03/25/2020   CAION 0.93 (L) 04/05/2019   PHOS 5.5 (H) 03/23/2020    Secondary Hyperparathyroidism present Phosph presentorus at goal.   5) subarachnoid bleed Primary team  and neurosurgery following   6) Electrolytes   BMP Latest Ref Rng & Units 03/25/2020 03/23/2020 03/22/2020  Glucose 70 - 99 mg/dL 125(H) 144(H) 115(H)  BUN 8 - 23 mg/dL 40(H) 43(H) 29(H)  Creatinine 0.61 - 1.24 mg/dL 7.22(H) 7.80(H) 6.22(H)  Sodium 135 - 145 mmol/L 136 134(L) 138  Potassium 3.5 - 5.1 mmol/L 4.3 4.9 4.6  Chloride 98 - 111 mmol/L 97(L) 94(L) 97(L)  CO2 22 - 32 mmol/L 26 25 23   Calcium 8.9 - 10.3 mg/dL 8.2(L) 8.1(L) 8.3(L)     Sodium Normonatremic   Potassium Normokalemic    7)Acid base    Co2 at goal     Plan:  No need for renal placement therapy today      Kolina Kube s [facility] 03/26/2020, 9:48 AM

## 2020-03-26 NOTE — Progress Notes (Signed)
Occupational Therapy Treatment Patient Details Name: Kyle [name] MRN: [id number] DOB: 05/24/1950 Today's Date: 03/26/2020    History of present illness Patient is a 70 year old male with PMH of ESRD on dialysis MWF, CVA with left side weakness , chronic diastolic heart failure, COPD, PAD, OSA, history of GI bleed, insulin-dependent diabetes, hyperlipidemia presents to the ER following syncopal episode and fall.  Patient has developed huge hematoma over his right eye after he was found on the floor. CT head and cervical spine shows small volume subarachnoid hemorrhage and possible subdural hemorrhage, along the superficial temporal lobe.   OT comments  Pt. Was lethargic today, requiring maxA To perform hand-to-face patterns with hand-over-hand assist using the right hand in preparation for light grooming tasks. Pt. Continues to benefit from OT services for ADL training, A/E training, there. Ex, and pt. Education about home modification, and DME. Pt. Continues to be most appropriate for SNF level of care upon discharge, with follow-up OT services.   Follow Up Recommendations  SNF;Supervision/Assistance - 24 hour    Equipment Recommendations  None recommended by OT    Recommendations for Other Services      Precautions / Restrictions Precautions Precautions: Fall Restrictions Weight Bearing Restrictions: No       Mobility Bed Mobility               General bed mobility comments: deferred due to lethargy  Transfers                 General transfer comment: Pt. unable 2/2 lethargy    Balance                                           ADL either performed or assessed with clinical judgement   ADL                                         General ADL Comments: Max Hand over hand assist for hand to face patterns in preparation for self-grooming.     Vision       Perception     Praxis      Cognition Arousal/Alertness:  Lethargic Behavior During Therapy: Flat affect                                            Exercises     Shoulder Instructions       General Comments      Pertinent Vitals/ Pain       Pain Assessment: No/denies pain  Home Living                                          Prior Functioning/Environment              Frequency  Min 1X/week        Progress Toward Goals  OT Goals(current goals can now be found in the care plan section)  Progress towards OT goals: OT to reassess next treatment  Acute Rehab OT Goals Patient Stated Goal: patient unable to state  Plan  Co-evaluation                 AM-PAC OT "6 Clicks" Daily Activity     Outcome Measure   Help from another person eating meals?: A Lot Help from another person taking care of personal grooming?: A Lot Help from another person toileting, which includes using toliet, bedpan, or urinal?: A Lot Help from another person bathing (including washing, rinsing, drying)?: A Lot Help from another person to put on and taking off regular upper body clothing?: A Lot Help from another person to put on and taking off regular lower body clothing?: A Lot 6 Click Score: 12    End of Session    OT Visit Diagnosis: Repeated falls (R29.6);Muscle weakness (generalized) (M62.81);History of falling (Z91.81);Other abnormalities of gait and mobility (R26.89);Other symptoms and signs involving cognitive function   Activity Tolerance Patient limited by lethargy   Patient Left in bed;with call bell/phone within reach;with bed alarm set   Nurse Communication Mobility status        Time: 2481-8590 OT Time Calculation (min): 15 min  Charges: OT General Charges $OT Visit: 1 Visit OT Treatments $Self Care/Home Management : 8-22 mins  Harrel Carina, MS, OTR/L  Harrel Carina 03/26/2020, 12:35 PM

## 2020-03-26 NOTE — Plan of Care (Signed)
Pt alert and awake at beginning of shift. Answers "yes" to most questions until he gets agitated and then wants to be left alone. No signs of distress. No change from baseline cognition. Will continue to monitor.   Problem: Education: Goal: Knowledge of General Education information will improve Description: Including pain rating scale, medication(s)/side effects and non-pharmacologic comfort measures Outcome: Progressing   Problem: Health Behavior/Discharge Planning: Goal: Ability to manage health-related needs will improve Outcome: Progressing   Problem: Clinical Measurements: Goal: Ability to maintain clinical measurements within normal limits will improve Outcome: Progressing Goal: Will remain free from infection Outcome: Progressing Goal: Diagnostic test results will improve Outcome: Progressing Goal: Respiratory complications will improve Outcome: Progressing Goal: Cardiovascular complication will be avoided Outcome: Progressing   Problem: Activity: Goal: Risk for activity intolerance will decrease Outcome: Progressing   Problem: Nutrition: Goal: Adequate nutrition will be maintained Outcome: Progressing   Problem: Coping: Goal: Level of anxiety will decrease Outcome: Progressing   Problem: Elimination: Goal: Will not experience complications related to bowel motility Outcome: Progressing Goal: Will not experience complications related to urinary retention Outcome: Progressing   Problem: Pain Managment: Goal: General experience of comfort will improve Outcome: Progressing   Problem: Safety: Goal: Ability to remain free from injury will improve Outcome: Progressing   Problem: Skin Integrity: Goal: Risk for impaired skin integrity will decrease Outcome: Progressing

## 2020-03-26 NOTE — Progress Notes (Signed)
PROGRESS NOTE    Kyle [name]  HYI:[id number] DOB: 30-Jun-1950 DOA: 03/20/2020 PCP: Idelle Crouch, MD   Brief Narrative:  This 70 years old male with PMH significant for ESRD on dialysis MWF, CVA, chronic diastolic heart failure, COPD, PAD, OSA, history of GI bleed, insulin-dependent diabetes, hyperlipidemia presents to the ER for the evaluation of fall following syncopal episode.  Patient has developed huge hematoma over his right eye after he was found on the floor.  Patient does not remember how did this happen. CT head and cervical spine shows small volume subarachnoid hemorrhage and possible subdural hemorrhage, along the superficial temporal lobe.  No significant mass-effect. Of note patient was recently discharged from hospital about 3 days ago where he was evaluated for GI bleed.   Patient is admitted for possible syncopal episode, Neurosurgery, Cardiology and Nephrology consults were called.  Patient underwent hemodialysis on 1/24.  Renal status is stable.  Cardiology is recommending Linq device  for detection of heart arrhythmias causing syncope.  Neurosurgery recommended no intervention.  Assessment & Plan:   Principal Problem:   Syncope and collapse Active Problems:   Chronic obstructive pulmonary disease (HCC)   Seizure disorder (HCC)   Hypothyroidism   ESRD (end stage renal disease) on dialysis (HCC)   Falls frequently   Depression   Chronic diastolic CHF (congestive heart failure) (HCC)   SAH (subarachnoid hemorrhage) (HCC)   Chronic sinusitis   Fall   Syncope and collapse: Patient was found on the floor by his wife following a fall. Patient does not remember the events and wife did not see any jerking motions suggestive of a seizure. He was not incontinent of urine or feces. 2D echo: LVEF 35 to 40%.  Left ventricle has decreased function.  There is global hypokinesis. Cardiology -> Patient does not have any telemetry changes or rhythm disturbance  consistent with episode of syncope.   Recommended Linq device but wife not agreeable per cardio note. Wife declined to have Linq device.  Subarachnoid bleed, traumatic Traumatic andfollowing a fall Neurosurgery seen and recommends hold Plavix and anticoagulants. Repeat CT head shows stable subarachnoid hemorrhage.  Hold Plavix for 7 days after fall , hold DVT prophylaxis for 48 hours.  Follow-up in neurosurgery clinic in 2 to 3 weeks.  Acute metabolic encephalopathy that could be due to [facility]: Patient remains intermittently confused since arrival. Some slowed speech/delayed response UA: unremarkable, vitals are stable. Most likely could be due to subarachnoid hemorrhage. No source of infection found.  Labs unremarkable. Nursing/PT concern on 1/27 -> slurred speech and delayed responses -  Neuro input appreciated- repeat CT head unchanged, EEG normal.  Seizure disorder;  Chronic and stable. Continue Keppra 500 mg twice a day Continue seizure precautions EEG normal  History of CVA with left-sided weakness Continuemetoprololand statins. Hold Plavix due to subarachnoid bleed  Diabetes mellituswith complications of end-stage heart disease on hemodialysis Maintain consistent carbohydrate diet. Sliding scale coverage with insulin Dialysis daysare M/W/F HD per nephro  Depression Continue trazodone and Lexapro  Hypertension -on metoprolol blood pressure running high, will add lisinopril on 1/28 for better blood pressure control   Hypothyroidism Continue Synthroid  Hypomagnesemia Supplement magnesium  Frequent falls Patient's wife is concerned about frequent falls at home with increased risk for fractures. PT, OT -> SNF  Chronic sinusitis Patient had a CT scan of the head and cervical spine which showed chronic sinusitis from left OMUobstruction with complicated disease at the left maxillary sinus with chronic defect in the  posterior wall. Discussed with  ENT Dr. Richardson Landry who recommended empiric antibiotic coverage with Augmentin and to have patient follow-up with Dr.Juengelas an outpatient to discuss surgical management. Upon discharge patient will need appointment to follow-up with Dr. Kathyrn Sheriff   Palliative care input appreciated - PC to follow at SNF.   DVT prophylaxis: SCDs Code Status: DNR Family Communication:  Spoke with wife on phone. Disposition Plan:   Status is: Inpatient  Remains inpatient appropriate because:Inpatient level of care appropriate due to severity of illness   Dispo: The patient is from: Home              Anticipated d/c is to: SNF              Anticipated d/c date is: > 3 days              Patient currently is not medically stable to d/c.   Difficult to place patient No   Consultants:   Cardiology  Neurosurgery  Neurology  Procedures: None Antimicrobials:   Anti-infectives (From admission, onward)   Start     Dose/Rate Route Frequency Ordered Stop   03/21/20 1800  amoxicillin-clavulanate (AUGMENTIN) 500-125 MG per tablet 500 mg        1 tablet Oral Every 24 hours 03/20/20 1416     03/20/20 1300  amoxicillin-clavulanate (AUGMENTIN) 875-125 MG per tablet 1 tablet  Status:  Discontinued        1 tablet Oral Every 24 hours 03/20/20 1213 03/20/20 1416      Subjective: Patient was seen and examined at bedside.  Overnight events noted.  Patient seems at his baseline mental status.   Patient has resolving hematoma around right eye.  Patient is alert oriented x1.  Objective: Vitals:   03/25/20 2122 03/26/20 0422 03/26/20 0803 03/26/20 1201  BP: (!) 144/110 (!) 144/72 (!) 156/72 (!) 160/78  Pulse: 80 76 76 77  Resp: 18 17 18 18   Temp: 98.3 F (36.8 C) 98 F (36.7 C) 98.4 F (36.9 C) 98.3 F (36.8 C)  TempSrc:    Oral  SpO2: 95% 100% 99% 96%  Weight:  80.7 kg    Height:        Intake/Output Summary (Last 24 hours) at 03/26/2020 1437 Last data filed at 03/26/2020 1350 Gross per 24 hour   Intake 480 ml  Output --  Net 480 ml   Filed Weights   03/23/20 0408 03/25/20 0551 03/26/20 0422  Weight: 80.8 kg 81.2 kg 80.7 kg    Examination:  General exam: Appears calm and comfortable, hematoma noted around right eye Respiratory system: Clear to auscultation. Respiratory effort normal. Cardiovascular system: S1 & S2 heard, RRR. No JVD, murmurs, rubs, gallops or clicks. No pedal edema. Gastrointestinal system: Abdomen is nondistended, soft and nontender. No organomegaly or masses felt. Normal bowel sounds heard. Central nervous system: Alert and oriented. No focal neurological deficits. Extremities: Symmetric 5 x 5 power. Skin: No rashes, lesions or ulcers Psychiatry: Not assessed    Data Reviewed: I have personally reviewed following labs and imaging studies  CBC: Recent Labs  Lab 03/20/20 0717 03/21/20 0340 03/22/20 0631 03/23/20 0929 03/25/20 0605  WBC 5.4 5.8 6.6 6.2 5.7  HGB 10.8* 10.0* 10.4* 10.3* 10.2*  HCT 33.4* 31.7* 32.3* 32.6* 30.8*  MCV 99.4 99.1 97.9 97.3 95.7  PLT 140* 142* 149* 129* 536*   Basic Metabolic Panel: Recent Labs  Lab 03/20/20 0717 03/21/20 0340 03/22/20 0631 03/23/20 0929 03/25/20 0605  NA 138 137  138 134* 136  K 4.3 4.6 4.6 4.9 4.3  CL 98 96* 97* 94* 97*  CO2 26 23 23 25 26   GLUCOSE 209* 146* 115* 144* 125*  BUN 30* 37* 29* 43* 40*  CREATININE 6.78* 7.74* 6.22* 7.80* 7.22*  CALCIUM 7.0* 7.3* 8.3* 8.1* 8.2*  MG  --   --  1.5*  --   --   PHOS  --   --  5.0* 5.5*  --    GFR: Estimated Creatinine Clearance: 10.3 mL/min (A) (by C-G formula based on SCr of 7.22 mg/dL (H)). Liver Function Tests: Recent Labs  Lab 03/23/20 0929  ALBUMIN 3.3*   No results for input(s): LIPASE, AMYLASE in the last 168 hours. No results for input(s): AMMONIA in the last 168 hours. Coagulation Profile: Recent Labs  Lab 03/20/20 0904  INR 1.1   Cardiac Enzymes: No results for input(s): CKTOTAL, CKMB, CKMBINDEX, TROPONINI in the last 168  hours. BNP (last 3 results) No results for input(s): PROBNP in the last 8760 hours. HbA1C: No results for input(s): HGBA1C in the last 72 hours. CBG: Recent Labs  Lab 03/25/20 1611 03/25/20 2123 03/26/20 0550 03/26/20 0804 03/26/20 1204  GLUCAP 205* 144* 123* 131* 193*   Lipid Profile: No results for input(s): CHOL, HDL, LDLCALC, TRIG, CHOLHDL, LDLDIRECT in the last 72 hours. Thyroid Function Tests: No results for input(s): TSH, T4TOTAL, FREET4, T3FREE, THYROIDAB in the last 72 hours. Anemia Panel: No results for input(s): VITAMINB12, FOLATE, FERRITIN, TIBC, IRON, RETICCTPCT in the last 72 hours. Sepsis Labs: No results for input(s): PROCALCITON, LATICACIDVEN in the last 168 hours.  Recent Results (from the past 240 hour(s))  SARS Coronavirus 2 by RT PCR (hospital order, performed in [facility] hospital lab) Nasopharyngeal Nasopharyngeal Swab     Status: None   Collection Time: 03/20/20  9:04 AM   Specimen: Nasopharyngeal Swab  Result Value Ref Range Status   SARS Coronavirus 2 NEGATIVE NEGATIVE Final    Comment: (NOTE) SARS-CoV-2 target nucleic acids are NOT DETECTED.  The SARS-CoV-2 RNA is generally detectable in upper and lower respiratory specimens during the acute phase of infection. The lowest concentration of SARS-CoV-2 viral copies this assay can detect is 250 copies / mL. A negative result does not preclude SARS-CoV-2 infection and should not be used as the sole basis for treatment or other patient management decisions.  A negative result may occur with improper specimen collection / handling, submission of specimen other than nasopharyngeal swab, presence of viral mutation(s) within the areas targeted by this assay, and inadequate number of viral copies (<250 copies / mL). A negative result must be combined with clinical observations, patient history, and epidemiological information.  Fact Sheet for Patients:   StrictlyIdeas.no  Fact  Sheet for Healthcare Providers: BankingDealers.co.za  This test is not yet approved or  cleared by the [place] FDA and has been authorized for detection and/or diagnosis of SARS-CoV-2 by FDA under an Emergency Use Authorization (EUA).  This EUA will remain in effect (meaning this test can be used) for the duration of the COVID-19 declaration under Section 564(b)(1) of the Act, 21 U.S.C. section 360bbb-3(b)(1), unless the authorization is terminated or revoked sooner.  Performed at [facility], [address] [place], [place] [address]     Radiology Studies: EEG  Result Date: 03/24/2020 Lora Havens, MD     03/24/2020  6:56 PM Patient Name: Shen[place]ah Vandergriff MRN: [id number] Epilepsy Attending: Lora Havens Referring Physician/Provider: Dr Amie [place]  Date: 03/24/2020 Duration: 20.55 mins Patient history: 70 year old man with history of strokes in seizures in the past admitted with a traumatic subarachnoid as well as traumatic hemorrhagic left temporal brain contusion with concern of mildly worse slurred speech. EEG to evaluate for seizure Level of alertness: Awake AEDs during EEG study: LEV, GBP Technical aspects: This EEG study was done with scalp electrodes positioned according to the 10-20 International system of electrode placement. Electrical activity was acquired at a sampling rate of 500Hz  and reviewed with a high frequency filter of 70Hz  and a low frequency filter of 1Hz . EEG data were recorded continuously and digitally stored. Description: The posterior dominant rhythm consists of 8 Hz activity of moderate voltage (25-35 uV) seen predominantly in posterior head regions, symmetric and reactive to eye opening and eye closing. Physiologic photic driving was not seen during photic stimulation.  Hyperventilation was not performed.   IMPRESSION: This study is within normal limits. No seizures or epileptiform discharges were seen throughout the  recording. Lora Havens   CT HEAD WO CONTRAST  Result Date: 03/24/2020 CLINICAL DATA:  Traumatic brain injury (TBI), new cognitive or neuro deficit EXAM: CT HEAD WITHOUT CONTRAST TECHNIQUE: Contiguous axial images were obtained from the base of the skull through the vertex without intravenous contrast. COMPARISON:  03/23/2020 and prior. FINDINGS: Brain: Left cerebral convexity subdural hematoma measuring up to 3 mm is grossly unchanged. Left temporal intraparenchymal hemorrhage measuring 2.6 x 2.0 cm is similar to prior exam (2:11). Small volume left cerebral convexity subarachnoid hemorrhage is unchanged. No intraventricular extension. No new focal hypodensity. No mass lesion. No midline shift, ventriculomegaly. Mild cerebral atrophy with ex vacuo dilatation. Chronic microvascular ischemic changes. Vascular: No hyperdense vessel or unexpected calcification. Skull: Negative for fracture or focal lesion. Sinuses/Orbits: Normal orbits. Opacification of the left frontal, ethmoid and maxillary sinus with chronic sequela is unchanged. Other: None. IMPRESSION: Left temporal intraparenchymal hemorrhage measuring 2.6 cm is unchanged. Small left cerebral convexity subdural hematoma with subarachnoid hemorrhage, similar prior exam. Electronically Signed   By: Primitivo Gauze M.D.   On: 03/24/2020 17:14   Scheduled Meds: . amoxicillin-clavulanate  1 tablet Oral Q24H  . atorvastatin  10 mg Oral Daily  . b complex-vitamin c-folic acid  1 tablet Oral Daily  . calcium acetate  1,334 mg Oral TID WC  . Chlorhexidine Gluconate Cloth  6 each Topical Daily  . gabapentin  100 mg Oral BID  . insulin aspart  0-6 Units Subcutaneous TID WC  . levETIRAcetam  500 mg Oral BID  . levothyroxine  200 mcg Oral QAC breakfast  . lidocaine-prilocaine  1 application Topical Q T,Th,Sa-HD  . lipase/protease/amylase  24,000 Units Oral TID WC  . lisinopril  5 mg Oral Daily  . metoprolol succinate  50 mg Oral Daily  .  pantoprazole  40 mg Oral Daily  . sodium chloride flush  3 mL Intravenous Q12H  . sodium chloride flush  3 mL Intravenous Q12H  . traZODone  100 mg Oral QHS   Continuous Infusions:   LOS: 4 days    Time spent: 25 mins    Shawna Clamp, MD [facility]   If 7PM-7AM, please contact night-coverage

## 2020-03-27 ENCOUNTER — Inpatient Hospital Stay: Payer: Medicare PPO

## 2020-03-27 DIAGNOSIS — E039 Hypothyroidism, unspecified: Secondary | ICD-10-CM | POA: Diagnosis not present

## 2020-03-27 LAB — PHOSPHORUS: Phosphorus: 3.7 mg/dL (ref 2.5–4.6)

## 2020-03-27 LAB — GLUCOSE, CAPILLARY
Glucose-Capillary: 143 mg/dL — ABNORMAL HIGH (ref 70–99)
Glucose-Capillary: 134 mg/dL — ABNORMAL HIGH (ref 70–99)
Glucose-Capillary: 142 mg/dL — ABNORMAL HIGH (ref 70–99)
Glucose-Capillary: 161 mg/dL — ABNORMAL HIGH (ref 70–99)

## 2020-03-27 LAB — CBC
HCT: 31.9 % — ABNORMAL LOW (ref 39.0–52.0)
Hemoglobin: 10.3 g/dL — ABNORMAL LOW (ref 13.0–17.0)
MCH: 31.3 pg (ref 26.0–34.0)
MCHC: 32.3 g/dL (ref 30.0–36.0)
MCV: 97 fL (ref 80.0–100.0)
Platelets: 142 10*3/uL — ABNORMAL LOW (ref 150–400)
RBC: 3.29 MIL/uL — ABNORMAL LOW (ref 4.22–5.81)
RDW: 14 % (ref 11.5–15.5)
WBC: 5.8 10*3/uL (ref 4.0–10.5)
nRBC: 0 % (ref 0.0–0.2)

## 2020-03-27 LAB — BASIC METABOLIC PANEL
Anion gap: 13 (ref 5–15)
BUN: 41 mg/dL — ABNORMAL HIGH (ref 8–23)
CO2: 25 mmol/L (ref 22–32)
Calcium: 8.6 mg/dL — ABNORMAL LOW (ref 8.9–10.3)
Chloride: 96 mmol/L — ABNORMAL LOW (ref 98–111)
Creatinine, Ser: 7.3 mg/dL — ABNORMAL HIGH (ref 0.61–1.24)
GFR, Estimated: 7 mL/min — ABNORMAL LOW (ref >60)
Glucose, Bld: 124 mg/dL — ABNORMAL HIGH (ref 70–99)
Potassium: 4.8 mmol/L (ref 3.5–5.1)
Sodium: 134 mmol/L — ABNORMAL LOW (ref 135–145)

## 2020-03-27 LAB — MAGNESIUM: Magnesium: 1.5 mg/dL — ABNORMAL LOW (ref 1.7–2.4)

## 2020-03-27 MED ORDER — MAGNESIUM SULFATE 2 GM/50ML IV SOLN
2.0000 g | Freq: Once | INTRAVENOUS | Status: AC
  Administered 2020-03-27: 2 g via INTRAVENOUS
  Filled 2020-03-27: qty 50

## 2020-03-27 NOTE — TOC Initial Note (Addendum)
Transition of Care [facility]) - Initial/Assessment Note    Patient Details  Name: Kyle [name] MRN: [id number] Date of Birth: [birth year]  [facility]) CM/SW Contact:    Kyle Stanford, LCSW Phone Number: 03/27/2020, 2:43 PM  Clinical Narrative:      Pt only alert to self. CSW spoke with pt's spouse via telephone. Pt's spouse was stating that pt could possible be hospice before he is able to d/c. Pt's spouse says he has deteriorated. Palliative has worked with family since admission. Pt's spouse is going to try ti talk to pt when she comes to the hospital today. Right now the rec is SNF, with palliative. However, pt has no bed offers at this time. Palliative to continue to work with pt and family. Pt's spouse states if pt does get to hospice level she would like Kyle [name].             Pt's spouse takes pt to appointments. Pt uses [facility]. Pt still sees Dr. Doy Hutching. Pt did have home health in the past ([facility] RN, PT, Aid). Palliative on board. Undetermined dispo at this time.  Expected Discharge Plan: Skilled Nursing Facility Barriers to Discharge: Continued Medical Work up   Patient Goals and CMS Choice Patient states their goals for this hospitalization and ongoing recovery are:: for pt to get better, or be hospice      Expected Discharge Plan and Services Expected Discharge Plan: [place] In-house Referral: Clinical Social Work   Post Acute Care Choice:  (undetermined at this time) Living arrangements for the past 2 months: Single Family Home                                      Prior Living Arrangements/Services Living arrangements for the past 2 months: Single Family Home Lives with:: Spouse Patient language and need for interpreter reviewed:: Yes Do you feel safe going back to the place where you live?: Yes      Need for Family Participation in Patient Care: Yes (Comment) Care giver support system in place?: Yes  (comment) Current home services: Home PT,Home RN,Homehealth aide Criminal Activity/Legal Involvement Pertinent to Current Situation/Hospitalization: No - Comment as needed  Activities of Daily Living      Permission Sought/Granted Permission sought to share information with : Family Supports    Share Information with NAME: Kyle [name]  Permission granted to share info w AGENCY: [employer]  Permission granted to share info w Relationship: spouse     Emotional Assessment Appearance:: Appears stated age Attitude/Demeanor/Rapport: Unable to Assess Affect (typically observed): Unable to Assess Orientation: : Oriented to Self Alcohol / Substance Use: Not Applicable Psych Involvement: No (comment)  Admission diagnosis:  Syncope and collapse [R55] Subdural hematoma ([place]) [S06.5X9A] Fall, initial encounter [W19.XXXA] Syncope, unspecified syncope type [R55] Subarachnoid hematoma with loss of consciousness, initial encounter [facility]) [S06.6X9A] Patient Active Problem List   Diagnosis Date Noted  . Fall   . Syncope and collapse 03/20/2020  . SAH (subarachnoid hemorrhage) ([place]) 03/20/2020  . Chronic sinusitis 03/20/2020  . Chronic diastolic CHF (congestive heart failure) ([place]) 03/16/2020  . Hyperkalemia 03/15/2020  . Seizure ([place]) 01/25/2020  . Depression   . Acute GI bleeding 07/28/2019  . Melena 07/22/2019  . History of GI bleed 07/22/2019  . Current every day smoker 06/22/2019  . Falls frequently 06/22/2019  . Palliative care by specialist   .  Goals of care, counseling/discussion   . Orthostasis 05/02/2019  . ESRD (end stage renal disease) on dialysis ([place])   . Acute blood loss anemia 04/30/2019  . Acute on chronic blood loss anemia 04/29/2019  . History of CVA (cerebrovascular accident) 04/29/2019  . GI bleed 04/04/2019  . 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[place]) 03/20/2019  . Generalized weakness   . Diabetic polyneuropathy associated with type 2 diabetes mellitus ([place])   . Cerebrovascular  accident (CVA) ([place])   . PAD (peripheral artery disease) ([place]) 03/17/2019  . Ptosis of eyelid, left 03/15/2019  . Physical deconditioning 03/14/2019  . Hypomagnesemia 03/13/2019  . Hyperphosphatemia 03/13/2019  . ESRD (end stage renal disease) ([place]) 03/13/2019  . Chronic kidney disease with peritoneal dialysis as preferred modality, stage 5 ([place]) 03/13/2019  . Prolonged QT interval 03/13/2019  . Hypertension associated with diabetes ([place]) 03/13/2019  . Hypothyroidism 03/13/2019  . Chronic diarrhea 03/13/2019  . Hypocalcemia 03/12/2019  . Ataxia 03/10/2019  . Dizziness 03/10/2019  . Vision problem 03/10/2019  . Hypokalemia 09/16/2018  . ESRD on peritoneal dialysis ([place]) 03/04/2018  . Lung nodule 06/12/2016  . Narrowing of airway   . Cigarette smoker 05/24/2016  . Collapse of right lung 05/24/2016  . COPD with chronic bronchitis ([place]) 05/24/2016  . Diarrhea due to malabsorption 07/01/2015  . Hx of adenomatous colonic polyps 07/01/2015  . Acne 05/20/2015  . Calculus of kidney 05/20/2015  . Chest pain, non-cardiac 05/20/2015  . Seizure disorder ([place]) 05/20/2015  . Current tobacco use 05/20/2015  . Chronic kidney disease (CKD), stage III (moderate) ([place]) 03/29/2015  . Insulin dependent type 2 diabetes mellitus ([place]) 03/15/2015  . Lipoma of shoulder 03/08/2014  . Other synovitis and tenosynovitis, right shoulder 03/08/2014  . Right supraspinatus tenosynovitis 03/08/2014  . Bursitis of elbow 02/15/2014  . Olecranon bursitis of right elbow 02/15/2014  . Absolute anemia 08/08/2013  . 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 Back pain, chronic 08/08/2013  . Chronic obstructive pulmonary disease ([place]) 08/08/2013  . BP (high blood pressure) 08/08/2013  . Hyperlipidemia associated with type 2 diabetes mellitus ([place]) 08/08/2013  . Acne erythematosa 08/08/2013   PCP:  Idelle Crouch, MD Pharmacy:   [facility] [address], [place] [address] Phone: [phone] Fax: [place], [place] [address] [place] [address] Phone: [phone] Fax: [place], [place] [address] Phone: [phone] Fax: [phone]     Social Determinants of Health (SDOH) Interventions    Readmission Risk Interventions Readmission Risk Prevention Plan 03/27/2020 01/26/2020  Transportation Screening Complete Complete  Medication Review Press photographer) (No Data) Complete  PCP or Specialist appointment within 3-5 days of discharge Complete Complete  HRI or Home Care Consult Complete Complete  SW Recovery Care/Counseling Consult Complete Complete  Palliative Care Screening Not Applicable Not [place] Not Complete Complete  SNF Comments waiting on bed offers -  Some recent data might be hidden

## 2020-03-27 NOTE — Progress Notes (Signed)
PT Cancellation Note  Patient Details Name: Kyle [name] MRN: [id number] DOB: 07/15/1950   Cancelled Treatment:    Reason Eval/Treat Not Completed: Fatigue/lethargy limiting ability to participate   Attempted x 2 this pm.  Fall noted this am with CT's completed.  Pt lethargic and did not awaken with verbal or tactile cues.  Would open eyes very briefly then continue snoring.  RN in room on second attempt stating lethargy is not his baseline.  Session held and will continue as appropriate.   Chesley Noon 03/27/2020, 3:43 PM

## 2020-03-27 NOTE — Progress Notes (Signed)
Wife [name] updated via phone call of patient fall this morning.

## 2020-03-27 NOTE — Progress Notes (Signed)
Status post fall note this morning. Pt unwitnessed fall at 0545 this am. Beside bed had had bowel movement. Pt assessed. VS taken,pt cleaned up and placed back in bed, neuro checks done. MD called, orders placed. Spouse called and message left to call to hospital

## 2020-03-27 NOTE — Progress Notes (Signed)
Kyle [name]  MRN: [id number]  DOB/AGE: 72-Mar-1952 72 y.o.  Primary Care Physician:Sparks, Leonie Douglas, MD  Admit date: 03/20/2020  Chief Complaint:  Chief Complaint  Patient presents with  . Fall    S-Pt presented on  03/20/2020 with  Chief Complaint  Patient presents with  . Fall  . Patient remains confused, patient does not offer any specific complaints  Medications . amoxicillin-clavulanate  1 tablet Oral Q24H  . atorvastatin  10 mg Oral Daily  . b complex-vitamin c-folic acid  1 tablet Oral Daily  . calcium acetate  1,334 mg Oral TID WC  . Chlorhexidine Gluconate Cloth  6 each Topical Daily  . gabapentin  100 mg Oral BID  . insulin aspart  0-6 Units Subcutaneous TID WC  . levETIRAcetam  500 mg Oral BID  . levothyroxine  200 mcg Oral QAC breakfast  . lidocaine-prilocaine  1 application Topical Q T,Th,Sa-HD  . lipase/protease/amylase  24,000 Units Oral TID WC  . lisinopril  5 mg Oral Daily  . metoprolol succinate  50 mg Oral Daily  . pantoprazole  40 mg Oral Daily  . sodium chloride flush  3 mL Intravenous Q12H  . sodium chloride flush  3 mL Intravenous Q12H  . traZODone  100 mg Oral QHS         ROS: Unable to get any data   Physical Exam: Vital signs in last 24 hours: Temp:  [97.5 F (36.4 C)-98.5 F (36.9 C)] 97.5 F (36.4 C) (01/30 0753) Pulse Rate:  [69-77] 70 (01/30 0753) Resp:  [16-19] 16 (01/30 0753) BP: (140-176)/(70-78) 176/77 (01/30 0753) SpO2:  [93 %-99 %] 97 % (01/30 0753) Weight change:  Last BM Date: 03/25/20  Intake/Output from previous day: 01/29 0701 - 01/30 0700 In: 720 [P.O.:720] Out: -  No intake/output data recorded.   Physical Exam:  General- pt is awake, but confused   Resp- No acute REsp distress, CTA B/L NO Rhonchi  CVS- S1S2 regular in rate and rhythm  GIT- BS+, soft, Non tender , Non distended  EXT- No LE Edema,  No Cyanosis  Access- LUE AVF  Lab Results:  CBC  Recent Labs    03/25/20 0605  03/27/20 0439  WBC 5.7 5.8  HGB 10.2* 10.3*  HCT 30.8* 31.9*  PLT 148* 142*    BMET  Recent Labs    03/25/20 0605 03/27/20 0439  NA 136 134*  K 4.3 4.8  CL 97* 96*  CO2 26 25  GLUCOSE 125* 124*  BUN 40* 41*  CREATININE 7.22* 7.30*  CALCIUM 8.2* 8.6*      Most recent Creatinine trend  Lab Results  Component Value Date   CREATININE 7.30 (H) 03/27/2020   CREATININE 7.22 (H) 03/25/2020   CREATININE 7.80 (H) 03/23/2020      MICRO   Recent Results (from the past 240 hour(s))  SARS Coronavirus 2 by RT PCR (hospital order, performed in [facility] hospital lab) Nasopharyngeal Nasopharyngeal Swab     Status: None   Collection Time: 03/20/20  9:04 AM   Specimen: Nasopharyngeal Swab  Result Value Ref Range Status   SARS Coronavirus 2 NEGATIVE NEGATIVE Final    Comment: (NOTE) SARS-CoV-2 target nucleic acids are NOT DETECTED.  The SARS-CoV-2 RNA is generally detectable in upper and lower respiratory specimens during the acute phase of infection. The lowest concentration of SARS-CoV-2 viral copies this assay can detect is 250 copies / mL. A negative result does not preclude SARS-CoV-2 infection and should not  be used as the sole basis for treatment or other patient management decisions.  A negative result may occur with improper specimen collection / handling, submission of specimen other than nasopharyngeal swab, presence of viral mutation(s) within the areas targeted by this assay, and inadequate number of viral copies (<250 copies / mL). A negative result must be combined with clinical observations, patient history, and epidemiological information.  Fact Sheet for Patients:   StrictlyIdeas.no  Fact Sheet for Healthcare Providers: BankingDealers.co.za  This test is not yet approved or  cleared by the [place] FDA and has been authorized for detection and/or diagnosis of SARS-CoV-2 by FDA under an Emergency Use  Authorization (EUA).  This EUA will remain in effect (meaning this test can be used) for the duration of the COVID-19 declaration under Section 564(b)(1) of the Act, 21 U.S.C. section 360bbb-3(b)(1), unless the authorization is terminated or revoked sooner.  Performed at [facility], [address] [place], [place] [address]          Impression:  Mr. Caige Almeda is a 70 y.o. white male with end stage renal disease on hemodialysis, CVA with residual left sided weakness, CVA, hypertension, seizure disorder, diabetes mellitus type II, hypertension who was admitted to [facility] on 03/20/2020 for Syncope and collapse [R55] Subdural hematoma ([place]) [S06.5X9A] Fall, initial encounter [W19.XXXA] Syncope, unspecified syncope type [R55] Subarachnoid hematoma with loss of consciousness, initial encounter [facility]) [S06.6X9A]  Patient was found by his wife after a fall. Patient reports multiple falls at home. Found to have subarachnoid hemorrhage and cerebral contusion. Patient disoriented but mental status is improving. Synocope work up by cardiology negative.   [facility] MWF [address] Left AVF 79.5kg   1)Renal    End-stage renal disease Patient is on hemodialysis Patient is on Monday Wednesday Friday schedule Patient was last dialyzed on Friday No need for dialysis today  2)HTN    Blood pressure is stable    3)Anemia of chronic disease  CBC Latest Ref Rng & Units 03/27/2020 03/25/2020 03/23/2020  WBC 4.0 - 10.5 K/uL 5.8 5.7 6.2  Hemoglobin 13.0 - 17.0 g/dL 10.3(L) 10.2(L) 10.3(L)  Hematocrit 39.0 - 52.0 % 31.9(L) 30.8(L) 32.6(L)  Platelets 150 - 400 K/uL 142(L) 148(L) 129(L)       HGb at goal (9--11)   4) Secondary hyperparathyroidism -CKD Mineral-Bone Disorder    Lab Results  Component Value Date   PTH 91 (H) 03/13/2019   CALCIUM 8.6 (L) 03/27/2020   CAION 0.93 (L) 04/05/2019   PHOS 3.7 03/27/2020    Secondary Hyperparathyroidism present Phosph  presentorus at goal.   5) subarachnoid bleed Primary team and neurosurgery following   6) Electrolytes   BMP Latest Ref Rng & Units 03/27/2020 03/25/2020 03/23/2020  Glucose 70 - 99 mg/dL 124(H) 125(H) 144(H)  BUN 8 - 23 mg/dL 41(H) 40(H) 43(H)  Creatinine 0.61 - 1.24 mg/dL 7.30(H) 7.22(H) 7.80(H)  Sodium 135 - 145 mmol/L 134(L) 136 134(L)  Potassium 3.5 - 5.1 mmol/L 4.8 4.3 4.9  Chloride 98 - 111 mmol/L 96(L) 97(L) 94(L)  CO2 22 - 32 mmol/L 25 26 25   Calcium 8.9 - 10.3 mg/dL 8.6(L) 8.2(L) 8.1(L)     Sodium Hyponatremia Secondary to ESRD   Potassium Normokalemic    7)Acid base    Co2 at goal     Plan:  No need for renal placement therapy today We will dialyze in the morning     Kynli Chou s [facility] 03/27/2020, 9:33 AM

## 2020-03-27 NOTE — Progress Notes (Signed)
PROGRESS NOTE    Kyle [name]  VZD:[id number] DOB: 03-21-1950 DOA: 03/20/2020 PCP: Idelle Crouch, MD   Brief Narrative:  This 70 years old male with PMH significant for ESRD on dialysis MWF, CVA, chronic diastolic heart failure, COPD, PAD, OSA, history of GI bleed, insulin-dependent diabetes, hyperlipidemia presents to the ER for the evaluation of fall following syncopal episode.  Patient has developed huge hematoma over his right eye after he was found on the floor.  Patient does not remember how did this happen. CT head and cervical spine shows small volume subarachnoid hemorrhage and possible subdural hemorrhage, along the superficial temporal lobe.  No significant mass-effect. Of note patient was recently discharged from hospital about 3 days ago where he was evaluated for GI bleed.   Patient is admitted for possible syncopal episode, Neurosurgery, Cardiology and Nephrology consults were called.  Patient underwent hemodialysis on 1/24.  Renal status is stable.  Cardiology is recommending Linq device  for detection of heart arrhythmias causing syncope. Neurosurgery recommended no intervention.  Assessment & Plan:   Principal Problem:   Syncope and collapse Active Problems:   Chronic obstructive pulmonary disease (HCC)   Seizure disorder (HCC)   Hypothyroidism   ESRD (end stage renal disease) on dialysis (HCC)   Falls frequently   Depression   Chronic diastolic CHF (congestive heart failure) (HCC)   SAH (subarachnoid hemorrhage) (HCC)   Chronic sinusitis   Fall   Syncope and collapse: Patient was found on the floor by his wife following a fall. Patient does not remember the events and wife did not see any jerking motions suggestive of a seizure. He was not incontinent of urine or feces. 2D echo: LVEF 35 to 40%.  Left ventricle has decreased function.  There is global hypokinesis. Cardiology -> Patient does not have any telemetry changes or rhythm disturbance consistent  with episode of syncope.   Recommended Linq device but wife not agreeable per cardio note. Wife declined to have Linq device.  Subarachnoid bleed, traumatic Traumatic andfollowing a fall Neurosurgery seen and recommends hold Plavix and anticoagulants. Repeat CT head shows stable subarachnoid hemorrhage.  Hold Plavix for 7 days after fall , hold DVT prophylaxis for 48 hours.  Follow-up in neurosurgery clinic in 2 to 3 weeks.  Acute metabolic encephalopathy that could be due to [facility]: Patient remains intermittently confused since arrival. Some slowed speech/delayed response UA: unremarkable, vitals are stable. Most likely could be due to subarachnoid hemorrhage. No source of infection found.  Labs unremarkable. Nursing/PT concern on 1/27 -> slurred speech and delayed responses -  Neuro input appreciated- repeat CT head unchanged, EEG normal.  Seizure disorder;  Chronic and stable. Continue Keppra 500 mg twice a day Continue seizure precautions EEG normal  History of CVA with left-sided weakness Continuemetoprololand statins. Hold Plavix due to subarachnoid bleed.   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 Sliding scale coverage with insulin Dialysis daysare M/W/F HD per nephro  Depression Continue trazodone and Lexapro  Hypertension -on metoprolol blood pressure running high, will add lisinopril on 1/28 for better blood pressure control   Hypothyroidism Continue Synthroid  Hypomagnesemia Supplement magnesium  Frequent falls Patient's wife is concerned about frequent falls at home with increased risk for fractures. 1/30: Patient fell early morning,  found alongside the bed.  Denies any head trauma,  CT head stat unchanged. PT, OT -> SNF  Chronic sinusitis Patient had a CT scan of the head and cervical spine  which showed chronic sinusitis from left OMUobstruction with  complicated disease at the left maxillary sinus with chronic defect in the posterior wall. Discussed with ENT Dr. Richardson Landry who recommended empiric antibiotic coverage with Augmentin and to have patient follow-up with Dr.Juengelas an outpatient to discuss surgical management. Upon discharge patient will need appointment to follow-up with Dr. Kathyrn Sheriff   Palliative care input appreciated - PC to follow at SNF.   DVT prophylaxis: SCDs Code Status: DNR Family Communication:  Spoke with wife on phone. Disposition Plan:   Status is: Inpatient  Remains inpatient appropriate because:Inpatient level of care appropriate due to severity of illness   Dispo: The patient is from: Home              Anticipated d/c is to: SNF              Anticipated d/c date is: > 3 days              Patient currently is not medically stable to d/c.   Difficult to place patient No   Consultants:   Cardiology  Neurosurgery  Neurology  Procedures: None Antimicrobials:   Anti-infectives (From admission, onward)   Start     Dose/Rate Route Frequency Ordered Stop   03/21/20 1800  amoxicillin-clavulanate (AUGMENTIN) 500-125 MG per tablet 500 mg        1 tablet Oral Every 24 hours 03/20/20 1416     03/20/20 1300  amoxicillin-clavulanate (AUGMENTIN) 875-125 MG per tablet 1 tablet  Status:  Discontinued        1 tablet Oral Every 24 hours 03/20/20 1213 03/20/20 1416      Subjective: Patient was seen and examined at bedside.  Overnight events noted.   Patient fell early morning, found alongside the bed.  Denied head injury. CT head stat, unchanged from previous CT. Patient seems at his baseline mental status.   Patient has resolving hematoma around right eye.  Patient is alert oriented x1.  Objective: Vitals:   03/27/20 0609 03/27/20 0753 03/27/20 1123 03/27/20 1203  BP: (!) 157/73 (!) 176/77 (!) 179/78 (!) 167/81  Pulse: 69 70 72 73  Resp: 19 16    Temp: 98.2 F (36.8 C) (!) 97.5 F (36.4 C)     TempSrc:  Oral    SpO2: 99% 97%  99%  Weight:      Height:        Intake/Output Summary (Last 24 hours) at 03/27/2020 1323 Last data filed at 03/26/2020 1830 Gross per 24 hour  Intake 480 ml  Output --  Net 480 ml   Filed Weights   03/23/20 0408 03/25/20 0551 03/26/20 0422  Weight: 80.8 kg 81.2 kg 80.7 kg    Examination:  General exam: Appears calm and comfortable, hematoma noted around right eye Respiratory system: Clear to auscultation. Respiratory effort normal. Cardiovascular system: S1 & S2 heard, RRR. No JVD, murmurs, rubs, gallops or clicks. No pedal edema. Gastrointestinal system: Abdomen is nondistended, soft and nontender. No organomegaly or masses felt. Normal bowel sounds heard. Central nervous system: Alert and oriented. No focal neurological deficits. Extremities: Symmetric 5 x 5 power. Skin: No rashes, lesions or ulcers Psychiatry: Not assessed    Data Reviewed: I have personally reviewed following labs and imaging studies  CBC: Recent Labs  Lab 03/21/20 0340 03/22/20 0631 03/23/20 0929 03/25/20 0605 03/27/20 0439  WBC 5.8 6.6 6.2 5.7 5.8  HGB 10.0* 10.4* 10.3* 10.2* 10.3*  HCT 31.7* 32.3* 32.6* 30.8* 31.9*  MCV 99.1  97.9 97.3 95.7 97.0  PLT 142* 149* 129* 148* 546*   Basic Metabolic Panel: Recent Labs  Lab 03/21/20 0340 03/22/20 0631 03/23/20 0929 03/25/20 0605 03/27/20 0439  NA 137 138 134* 136 134*  K 4.6 4.6 4.9 4.3 4.8  CL 96* 97* 94* 97* 96*  CO2 23 23 25 26 25   GLUCOSE 146* 115* 144* 125* 124*  BUN 37* 29* 43* 40* 41*  CREATININE 7.74* 6.22* 7.80* 7.22* 7.30*  CALCIUM 7.3* 8.3* 8.1* 8.2* 8.6*  MG  --  1.5*  --   --  1.5*  PHOS  --  5.0* 5.5*  --  3.7   GFR: Estimated Creatinine Clearance: 10.2 mL/min (A) (by C-G formula based on SCr of 7.3 mg/dL (H)). Liver Function Tests: Recent Labs  Lab 03/23/20 0929  ALBUMIN 3.3*   No results for input(s): LIPASE, AMYLASE in the last 168 hours. No results for input(s): AMMONIA in the  last 168 hours. Coagulation Profile: No results for input(s): INR, PROTIME in the last 168 hours. Cardiac Enzymes: No results for input(s): CKTOTAL, CKMB, CKMBINDEX, TROPONINI in the last 168 hours. BNP (last 3 results) No results for input(s): PROBNP in the last 8760 hours. HbA1C: No results for input(s): HGBA1C in the last 72 hours. CBG: Recent Labs  Lab 03/26/20 1204 03/26/20 1707 03/26/20 2008 03/27/20 0905 03/27/20 1203  GLUCAP 193* 178* 133* 143* 134*   Lipid Profile: No results for input(s): CHOL, HDL, LDLCALC, TRIG, CHOLHDL, LDLDIRECT in the last 72 hours. Thyroid Function Tests: No results for input(s): TSH, T4TOTAL, FREET4, T3FREE, THYROIDAB in the last 72 hours. Anemia Panel: No results for input(s): VITAMINB12, FOLATE, FERRITIN, TIBC, IRON, RETICCTPCT in the last 72 hours. Sepsis Labs: No results for input(s): PROCALCITON, LATICACIDVEN in the last 168 hours.  Recent Results (from the past 240 hour(s))  SARS Coronavirus 2 by RT PCR (hospital order, performed in [facility] hospital lab) Nasopharyngeal Nasopharyngeal Swab     Status: None   Collection Time: 03/20/20  9:04 AM   Specimen: Nasopharyngeal Swab  Result Value Ref Range Status   SARS Coronavirus 2 NEGATIVE NEGATIVE Final    Comment: (NOTE) SARS-CoV-2 target nucleic acids are NOT DETECTED.  The SARS-CoV-2 RNA is generally detectable in upper and lower respiratory specimens during the acute phase of infection. The lowest concentration of SARS-CoV-2 viral copies this assay can detect is 250 copies / mL. A negative result does not preclude SARS-CoV-2 infection and should not be used as the sole basis for treatment or other patient management decisions.  A negative result may occur with improper specimen collection / handling, submission of specimen other than nasopharyngeal swab, presence of viral mutation(s) within the areas targeted by this assay, and inadequate number of viral copies (<250 copies /  mL). A negative result must be combined with clinical observations, patient history, and epidemiological information.  Fact Sheet for Patients:   StrictlyIdeas.no  Fact Sheet for Healthcare Providers: BankingDealers.co.za  This test is not yet approved or  cleared by the [place] FDA and has been authorized for detection and/or diagnosis of SARS-CoV-2 by FDA under an Emergency Use Authorization (EUA).  This EUA will remain in effect (meaning this test can be used) for the duration of the COVID-19 declaration under Section 564(b)(1) of the Act, 21 U.S.C. section 360bbb-3(b)(1), unless the authorization is terminated or revoked sooner.  Performed at [facility], [address] [place],  [address]     Radiology Studies: CT HEAD WO CONTRAST  Result Date: 03/27/2020 CLINICAL DATA:  Head trauma. Intracranial hemorrhage. Fall 7 days prior EXAM: CT HEAD WITHOUT CONTRAST TECHNIQUE: Contiguous axial images were obtained from the base of the skull through the vertex without intravenous contrast. COMPARISON:  Head CT 03/24/2020 is FINDINGS: Brain: Parenchymal hemorrhage in the LEFT temporal operculum again noted. Hemorrhage measures approximately 2.6 by 2.1 cm unchanged from 2.6 x 2.0 cm. Visually hemorrhage appears unchanged. There is associated edema more medially within the LEFT upper lobe. No intraventricular hemorrhage. No midline shift. Basal cisterns are patent. Shallow subdural hematoma noted posteriorly over the LEFT occipital lobe measuring 2 mm (image 14/3) unchanged. Vascular: No hyperdense vessel or unexpected calcification. Skull: No associated skull fracture with the LEFT intraparenchymal hemorrhage. No skull base fracture. Sinuses/Orbits: Complete opacification of the LEFT maxillary sinus with chronic bowing of the medial wall. Other: None IMPRESSION: 1. No interval change in a parenchymal hemorrhage within the LEFT  temporal operculum. 2. Shallow subdural hematoma over the LEFT cerebral convexity unchanged. 3. No midline shift or mass effect. Basilar cisterns are patent. 4. Chronic opacification of the LEFT maxillary sinus. Electronically Signed   By: Suzy Bouchard M.D.   On: 03/27/2020 07:53   Scheduled Meds: . amoxicillin-clavulanate  1 tablet Oral Q24H  . atorvastatin  10 mg Oral Daily  . b complex-vitamin c-folic acid  1 tablet Oral Daily  . calcium acetate  1,334 mg Oral TID WC  . Chlorhexidine Gluconate Cloth  6 each Topical Daily  . gabapentin  100 mg Oral BID  . insulin aspart  0-6 Units Subcutaneous TID WC  . levETIRAcetam  500 mg Oral BID  . levothyroxine  200 mcg Oral QAC breakfast  . lidocaine-prilocaine  1 application Topical Q T,Th,Sa-HD  . lipase/protease/amylase  24,000 Units Oral TID WC  . lisinopril  5 mg Oral Daily  . metoprolol succinate  50 mg Oral Daily  . pantoprazole  40 mg Oral Daily  . sodium chloride flush  3 mL Intravenous Q12H  . sodium chloride flush  3 mL Intravenous Q12H  . traZODone  100 mg Oral QHS   Continuous Infusions:   LOS: 5 days    Time spent: 25 mins    Shawna Clamp, MD [facility]   If 7PM-7AM, please contact night-coverage

## 2020-03-27 NOTE — Progress Notes (Signed)
Patient's wife at bedside. Wanting to speak with primary RN. Wife states she has spoken with hospice and palliative care regarding patient's current plan. Says her two parents passed and were with hospice care. She states patient has had decline since previous stroke and that she feels it is not safe for patient to be at home as he does not follow commands and has been in ED 3 times in past month as per wife. She states to me, RN that she is unsure of making decisions regarding discontinuing patient's dialysis treatment etc. Claims she is patient's HCPOA. RN will inquire from wife for proper POA documentation. Spoke with wife for about 15 minutes regarding patient's status and the possibility of transitioning to hospice, with mostly spouse speaking and RN for emotional support. Wife states she would like to speak with MD for some guidance in further decision regarding her spouse's plan of care. MD notified along with case worker via secure chat about current situation.

## 2020-03-28 DIAGNOSIS — E039 Hypothyroidism, unspecified: Secondary | ICD-10-CM | POA: Diagnosis not present

## 2020-03-28 LAB — BASIC METABOLIC PANEL
Anion gap: 15 (ref 5–15)
BUN: 50 mg/dL — ABNORMAL HIGH (ref 8–23)
CO2: 24 mmol/L (ref 22–32)
Calcium: 8.8 mg/dL — ABNORMAL LOW (ref 8.9–10.3)
Chloride: 93 mmol/L — ABNORMAL LOW (ref 98–111)
Creatinine, Ser: 8.4 mg/dL — ABNORMAL HIGH (ref 0.61–1.24)
GFR, Estimated: 6 mL/min — ABNORMAL LOW (ref >60)
Glucose, Bld: 170 mg/dL — ABNORMAL HIGH (ref 70–99)
Potassium: 5.4 mmol/L — ABNORMAL HIGH (ref 3.5–5.1)
Sodium: 132 mmol/L — ABNORMAL LOW (ref 135–145)

## 2020-03-28 LAB — PHOSPHORUS: Phosphorus: 4.4 mg/dL (ref 2.5–4.6)

## 2020-03-28 LAB — GLUCOSE, CAPILLARY
Glucose-Capillary: 112 mg/dL — ABNORMAL HIGH (ref 70–99)
Glucose-Capillary: 155 mg/dL — ABNORMAL HIGH (ref 70–99)
Glucose-Capillary: 158 mg/dL — ABNORMAL HIGH (ref 70–99)
Glucose-Capillary: 165 mg/dL — ABNORMAL HIGH (ref 70–99)
Glucose-Capillary: 88 mg/dL (ref 70–99)

## 2020-03-28 LAB — CBC
HCT: 31.7 % — ABNORMAL LOW (ref 39.0–52.0)
Hemoglobin: 10.1 g/dL — ABNORMAL LOW (ref 13.0–17.0)
MCH: 31 pg (ref 26.0–34.0)
MCHC: 31.9 g/dL (ref 30.0–36.0)
MCV: 97.2 fL (ref 80.0–100.0)
Platelets: 149 10*3/uL — ABNORMAL LOW (ref 150–400)
RBC: 3.26 MIL/uL — ABNORMAL LOW (ref 4.22–5.81)
RDW: 13.8 % (ref 11.5–15.5)
WBC: 7.5 10*3/uL (ref 4.0–10.5)
nRBC: 0 % (ref 0.0–0.2)

## 2020-03-28 LAB — MAGNESIUM: Magnesium: 1.9 mg/dL (ref 1.7–2.4)

## 2020-03-28 NOTE — Progress Notes (Signed)
Mobility Specialist - Progress Note   03/28/20 1500  Mobility  Activity Contraindicated/medical hold  Mobility performed by Mobility specialist    Per chart review, pt with elevated K+ of 5.4 this date. Pt not appropriate for exertional activity at this time. Will continue to monitor and attempt session at another date/time.    Kathee Delton Mobility Specialist 03/28/20, 3:20 PM

## 2020-03-28 NOTE — Progress Notes (Signed)
OT Cancellation Note  Patient Details Name: Kyle [name] MRN: [id number] DOB: 11/12/1950   Cancelled Treatment:    Reason Eval/Treat Not Completed: Medical issues which prohibited therapy. Chart reviewed.  Pt's potassium noted to be elevated to 5.4 this morning. Per OT guidelines for elevated potassium, exertional activity contraindicated.  Will hold OT at this time and re-attempt OT treatment session at a later date/time as medically appropriate.  Jeni Salles, MPH, MS, OTR/L ascom [phone] 03/28/20, 8:50 AM

## 2020-03-28 NOTE — Progress Notes (Signed)
Attempted again to call report. States they will call me back.

## 2020-03-28 NOTE — Care Management Important Message (Signed)
Important Message  Patient Details  Name: Kyle [name] MRN: [id number] Date of Birth: 08/28/1950   Medicare Important Message Given:  Yes     Dannette Barbara 03/28/2020, 1:35 PM

## 2020-03-28 NOTE — Progress Notes (Signed)
[facility]  ROUNDING NOTE   Subjective:   [place] of stay: 6  Doing fair Answers "yes" to most questions Sleepy but arousable Did not eat his breakfast    Objective:  Vital signs in last 24 hours:  Temp:  [97.5 F (36.4 C)-98.2 F (36.8 C)] 98 F (36.7 C) (01/31 0831) Pulse Rate:  [69-73] 71 (01/31 0831) Resp:  [16-18] 16 (01/31 0831) BP: (151-179)/(65-81) 159/78 (01/31 0831) SpO2:  [98 %-100 %] 98 % (01/31 0831)  Weight change:  Filed Weights   03/23/20 0408 03/25/20 0551 03/26/20 0422  Weight: 80.8 kg 81.2 kg 80.7 kg    Intake/Output: I/O last 3 completed shifts: In: 480 [P.O.:480] Out: -    Intake/Output this shift:  No intake/output data recorded.  Physical Exam: General: NAD, laying in bed  Head: Right ecchymosis+. Moist oral mucosal membranes  Lungs:  Clear to auscultation  Heart: Regular rate and rhythm  Abdomen:  Soft, nontender,   Extremities:  No peripheral edema.  Neurologic: oriented only to self  Skin: abrasions on his right arm   Access: Left upper extremity AVF, +thrill      Basic Metabolic Panel: Recent Labs  Lab 03/22/20 0631 03/23/20 0929 03/25/20 0605 03/27/20 0439 03/28/20 0446  NA 138 134* 136 134* 132*  K 4.6 4.9 4.3 4.8 5.4*  CL 97* 94* 97* 96* 93*  CO2 23 25 26 25 24   GLUCOSE 115* 144* 125* 124* 170*  BUN 29* 43* 40* 41* 50*  CREATININE 6.22* 7.80* 7.22* 7.30* 8.40*  CALCIUM 8.3* 8.1* 8.2* 8.6* 8.8*  MG 1.5*  --   --  1.5* 1.9  PHOS 5.0* 5.5*  --  3.7 4.4    Liver Function Tests: Recent Labs  Lab 03/23/20 0929  ALBUMIN 3.3*   No results for input(s): LIPASE, AMYLASE in the last 168 hours. No results for input(s): AMMONIA in the last 168 hours.  CBC: Recent Labs  Lab 03/22/20 0631 03/23/20 0929 03/25/20 0605 03/27/20 0439 03/28/20 0446  WBC 6.6 6.2 5.7 5.8 7.5  HGB 10.4* 10.3* 10.2* 10.3* 10.1*  HCT 32.3* 32.6* 30.8* 31.9* 31.7*  MCV 97.9 97.3 95.7 97.0 97.2  PLT 149* 129* 148* 142* 149*     Cardiac Enzymes: No results for input(s): CKTOTAL, CKMB, CKMBINDEX, TROPONINI in the last 168 hours.  BNP: Invalid input(s): POCBNP  CBG: Recent Labs  Lab 03/27/20 1203 03/27/20 1659 03/27/20 2057 03/28/20 0441 03/28/20 0833  GLUCAP 134* 142* 161* 165* 155*    Microbiology: Results for orders placed or performed during the hospital encounter of 03/20/20  SARS Coronavirus 2 by RT PCR (hospital order, performed in [facility] hospital lab) Nasopharyngeal Nasopharyngeal Swab     Status: None   Collection Time: 03/20/20  9:04 AM   Specimen: Nasopharyngeal Swab  Result Value Ref Range Status   SARS Coronavirus 2 NEGATIVE NEGATIVE Final    Comment: (NOTE) SARS-CoV-2 target nucleic acids are NOT DETECTED.  The SARS-CoV-2 RNA is generally detectable in upper and lower respiratory specimens during the acute phase of infection. The lowest concentration of SARS-CoV-2 viral copies this assay can detect is 250 copies / mL. A negative result does not preclude SARS-CoV-2 infection and should not be used as the sole basis for treatment or other patient management decisions.  A negative result may occur with improper specimen collection / handling, submission of specimen other than nasopharyngeal swab, presence of viral mutation(s) within the areas targeted by this assay, and inadequate number of viral  copies (<250 copies / mL). A negative result must be combined with clinical observations, patient history, and epidemiological information.  Fact Sheet for Patients:   StrictlyIdeas.no  Fact Sheet for Healthcare Providers: BankingDealers.co.za  This test is not yet approved or  cleared by the [place] FDA and has been authorized for detection and/or diagnosis of SARS-CoV-2 by FDA under an Emergency Use Authorization (EUA).  This EUA will remain in effect (meaning this test can be used) for the duration of the COVID-19 declaration  under Section 564(b)(1) of the Act, 21 U.S.C. section 360bbb-3(b)(1), unless the authorization is terminated or revoked sooner.  Performed at [facility], [address] [place], [place] [address]     Coagulation Studies: No results for input(s): LABPROT, INR in the last 72 hours.  Urinalysis: No results for input(s): COLORURINE, LABSPEC, PHURINE, GLUCOSEU, HGBUR, BILIRUBINUR, KETONESUR, PROTEINUR, UROBILINOGEN, NITRITE, LEUKOCYTESUR in the last 72 hours.  Invalid input(s): APPERANCEUR    Imaging: CT HEAD WO CONTRAST  Result Date: 03/27/2020 CLINICAL DATA:  Head trauma. Intracranial hemorrhage. Fall 7 days prior EXAM: CT HEAD WITHOUT CONTRAST TECHNIQUE: Contiguous axial images were obtained from the base of the skull through the vertex without intravenous contrast. COMPARISON:  Head CT 03/24/2020 is FINDINGS: Brain: Parenchymal hemorrhage in the LEFT temporal operculum again noted. Hemorrhage measures approximately 2.6 by 2.1 cm unchanged from 2.6 x 2.0 cm. Visually hemorrhage appears unchanged. There is associated edema more medially within the LEFT upper lobe. No intraventricular hemorrhage. No midline shift. Basal cisterns are patent. Shallow subdural hematoma noted posteriorly over the LEFT occipital lobe measuring 2 mm (image 14/3) unchanged. Vascular: No hyperdense vessel or unexpected calcification. Skull: No associated skull fracture with the LEFT intraparenchymal hemorrhage. No skull base fracture. Sinuses/Orbits: Complete opacification of the LEFT maxillary sinus with chronic bowing of the medial wall. Other: None IMPRESSION: 1. No interval change in a parenchymal hemorrhage within the LEFT temporal operculum. 2. Shallow subdural hematoma over the LEFT cerebral convexity unchanged. 3. No midline shift or mass effect. Basilar cisterns are patent. 4. Chronic opacification of the LEFT maxillary sinus. Electronically Signed   By: Suzy Bouchard M.D.   On: 03/27/2020 07:53      Medications:    . amoxicillin-clavulanate  1 tablet Oral Q24H  . atorvastatin  10 mg Oral Daily  . b complex-vitamin c-folic acid  1 tablet Oral Daily  . calcium acetate  1,334 mg Oral TID WC  . Chlorhexidine Gluconate Cloth  6 each Topical Daily  . gabapentin  100 mg Oral BID  . insulin aspart  0-6 Units Subcutaneous TID WC  . levETIRAcetam  500 mg Oral BID  . levothyroxine  200 mcg Oral QAC breakfast  . lidocaine-prilocaine  1 application Topical Q T,Th,Sa-HD  . lipase/protease/amylase  24,000 Units Oral TID WC  . lisinopril  5 mg Oral Daily  . metoprolol succinate  50 mg Oral Daily  . pantoprazole  40 mg Oral Daily  . sodium chloride flush  3 mL Intravenous Q12H  . sodium chloride flush  3 mL Intravenous Q12H  . traZODone  100 mg Oral QHS   acetaminophen **OR** acetaminophen  Assessment/ Plan:  Mr. Haider Hornaday is a 70 y.o. white male with end stage renal disease on hemodialysis, CVA with residual left sided weakness, CVA, hypertension, seizure disorder, diabetes mellitus type II, hypertension who was admitted to [facility] on 03/20/2020 for Syncope and collapse [R55] Subdural hematoma ([place]) [S06.5X9A] Fall, initial encounter [W19.XXXA] Syncope, unspecified syncope type [  R55] Subarachnoid hematoma with loss of consciousness, initial encounter [facility]) [S06.6X9A]  Patient was found by his wife after a fall. Patient reports multiple falls at home. Found to have subarachnoid hemorrhage and cerebral contusion. Patient disoriented but mental status is improving. Synocope work up by cardiology negative.   [facility] MWF [address] Left AVF 79.5kg  1.  ESRD on HD MWF.   HD today No heparin  2.  Anemia of chronic kidney disease:  Lab Results  Component Value Date   HGB 10.1 (L) 03/28/2020   monitor   3.  Secondary hyperparathyroidism. Calcium and phosphorus at goal.  -  Continue calcium acetate with meals  4.  other issues S/p fall from syncope CT heal 1/30= left  temporal parenchymal hemorrhage, shallow subdural hematoma, no midline shifts   LOS: 6 Kwan Shellhammer 1/31/202210:09 AM

## 2020-03-28 NOTE — Progress Notes (Signed)
PT Cancellation Note  Patient Details Name: Kyle [name] MRN: [id number] DOB: [birth year]   Cancelled Treatment:    Reason Eval/Treat Not Completed: Patient not medically ready.  Chart reviewed.  Pt's potassium noted to be elevated to 5.4 this morning.  Per PT guidelines for elevated potassium, exertional activity contraindicated.  Will hold PT at this time and re-attempt PT treatment session at a later date/time as medically appropriate.  Leitha Bleak, PT 03/28/20, 8:42 AM

## 2020-03-28 NOTE — Progress Notes (Signed)
PROGRESS NOTE    Kyle [name]  WGN:[id number] DOB: 08-May-1950 DOA: 03/20/2020 PCP: Idelle Crouch, MD   Brief Narrative:  This 70 years old male with PMH significant for ESRD on dialysis MWF, CVA, chronic diastolic heart failure, COPD, PAD, OSA, history of GI bleed, insulin-dependent diabetes, hyperlipidemia presents to the ER for the evaluation of fall following syncopal episode.  Patient has developed huge hematoma over his right eye after he was found on the floor.  Patient does not remember how did this happen. CT head and cervical spine shows small volume subarachnoid hemorrhage and possible subdural hemorrhage, along the superficial temporal lobe.  No significant mass-effect. Of note patient was recently discharged from hospital about 3 days ago where he was evaluated for GI bleed.   Patient is admitted for possible syncopal episode, Neurosurgery, Cardiology and Nephrology consults were called.  Patient underwent hemodialysis on 1/24.  Renal status is stable.  Cardiology is recommending Linq device  for detection of heart arrhythmias causing syncope. Neurosurgery recommended no intervention.  Assessment & Plan:   Principal Problem:   Syncope and collapse Active Problems:   Chronic obstructive pulmonary disease (HCC)   Seizure disorder (HCC)   Hypothyroidism   ESRD (end stage renal disease) on dialysis (HCC)   Falls frequently   Depression   Chronic diastolic CHF (congestive heart failure) (HCC)   SAH (subarachnoid hemorrhage) (HCC)   Chronic sinusitis   Fall   Syncope and collapse: Patient was found on the floor by his wife following a fall. Patient does not remember the events and wife did not see any jerking motions suggestive of a seizure. He was not incontinent of urine or feces. 2D echo: LVEF 35 to 40%.  Left ventricle has decreased function.  There is global hypokinesis. Cardiology -> Patient does not have any telemetry changes or rhythm disturbance consistent  with episode of syncope.   Recommended Linq device but wife not agreeable per cardio note. Wife declined to have Linq device.  Subarachnoid bleed, traumatic Traumatic andfollowing a fall Neurosurgery seen and recommends hold Plavix and anticoagulants. Repeat CT head shows stable subarachnoid hemorrhage.  Hold Plavix for 7 days after fall , hold DVT prophylaxis for 48 hours.  Follow-up in neurosurgery clinic in 2 to 3 weeks. Patient had another fall in the hospital, Repeat Ct head showed stable hematoma.  Acute metabolic encephalopathy that could be due to [facility]: Patient remains intermittently confused since arrival. Some slowed speech/delayed response UA: unremarkable, vitals are stable. Most likely could be due to subarachnoid hemorrhage. No source of infection found.  Labs unremarkable. Nursing/PT concern on 1/27 -> slurred speech and delayed responses -  Neuro input appreciated- repeat CT head unchanged, EEG normal.  Seizure disorder;  Chronic and stable. Continue Keppra 500 mg twice a day Continue seizure precautions EEG normal  History of CVA with left-sided weakness Continuemetoprololand statins. Hold Plavix due to subarachnoid bleed.   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 Sliding scale coverage with insulin Dialysis daysare M/W/F HD per nephro.  Depression Continue trazodone and Lexapro  Hypertension -on metoprolol blood pressure running high, will add lisinopril on 1/28 for better blood pressure control   Hypothyroidism Continue Synthroid  Hypomagnesemia Supplement magnesium  Frequent falls Patient's wife is concerned about frequent falls at home with increased risk for fractures. 1/30: Patient fell early morning,  found alongside the bed.  Denies any head trauma,  CT head stat unchanged. PT, OT -> SNF  Chronic sinusitis Patient had a CT scan of the head  and cervical spine which showed chronic sinusitis from left OMUobstruction with complicated disease at the left maxillary sinus with chronic defect in the posterior wall. Discussed with ENT Dr. Richardson Landry who recommended empiric antibiotic coverage with Augmentin and to have patient follow-up with Dr.Juengelas an outpatient to discuss surgical management. Upon discharge patient will need appointment to follow-up with Dr. Kathyrn Sheriff   Palliative care input appreciated - PC to follow at SNF.   DVT prophylaxis: SCDs Code Status: DNR Family Communication:  Spoke with wife on phone. Disposition Plan:   Status is: Inpatient  Remains inpatient appropriate because:Inpatient level of care appropriate due to severity of illness   Dispo: The patient is from: Home              Anticipated d/c is to: SNF              Anticipated d/c date is: > 3 days              Patient currently is not medically stable to d/c.   Difficult to place patient No   Consultants:   Cardiology  Neurosurgery  Neurology  Procedures: None Antimicrobials:   Anti-infectives (From admission, onward)   Start     Dose/Rate Route Frequency Ordered Stop   03/21/20 1800  amoxicillin-clavulanate (AUGMENTIN) 500-125 MG per tablet 500 mg        1 tablet Oral Every 24 hours 03/20/20 1416     03/20/20 1300  amoxicillin-clavulanate (AUGMENTIN) 875-125 MG per tablet 1 tablet  Status:  Discontinued        1 tablet Oral Every 24 hours 03/20/20 1213 03/20/20 1416      Subjective: Patient was seen and examined at bedside. Overnight events noted.   Patient seems at his baseline mental status.  He answers yes to all questions asked. Patient has resolving hematoma around right eye.  Patient is alert oriented x1.  Objective: Vitals:   03/28/20 0440 03/28/20 0756 03/28/20 0831 03/28/20 1134  BP: (!) 161/77 (!) 161/65 (!) 159/78 (!) 152/78  Pulse: 72 72 71 73  Resp: 18 17 16 16   Temp: 98.2 F (36.8 C) (!) 97.5 F (36.4 C)  98 F (36.7 C) 97.8 F (36.6 C)  TempSrc:  Axillary Oral Oral  SpO2: 99% 100% 98% 99%  Weight:      Height:        Intake/Output Summary (Last 24 hours) at 03/28/2020 1438 Last data filed at 03/27/2020 1820 Gross per 24 hour  Intake 240 ml  Output --  Net 240 ml   Filed Weights   03/23/20 0408 03/25/20 0551 03/26/20 0422  Weight: 80.8 kg 81.2 kg 80.7 kg    Examination:  General exam: Appears calm and comfortable, hematoma noted around right eye Respiratory system: Clear to auscultation. Respiratory effort normal. Cardiovascular system: S1 & S2 heard, RRR. No JVD, murmurs, rubs, gallops or clicks. No pedal edema. Gastrointestinal system: Abdomen is nondistended, soft and nontender. No organomegaly or masses felt. Normal bowel sounds heard. Central nervous system: Alert and oriented. No focal neurological deficits. Extremities: Symmetric 5 x 5 power. Skin: No rashes, lesions or ulcers Psychiatry: Not assessed    Data Reviewed: I have personally reviewed following labs and imaging studies  CBC: Recent Labs  Lab 03/22/20 0631 03/23/20 0929 03/25/20 0605 03/27/20 0439 03/28/20 0446  WBC 6.6 6.2 5.7 5.8 7.5  HGB 10.4* 10.3* 10.2* 10.3* 10.1*  HCT 32.3* 32.6* 30.8*  31.9* 31.7*  MCV 97.9 97.3 95.7 97.0 97.2  PLT 149* 129* 148* 142* 782*   Basic Metabolic Panel: Recent Labs  Lab 03/22/20 0631 03/23/20 0929 03/25/20 0605 03/27/20 0439 03/28/20 0446  NA 138 134* 136 134* 132*  K 4.6 4.9 4.3 4.8 5.4*  CL 97* 94* 97* 96* 93*  CO2 23 25 26 25 24   GLUCOSE 115* 144* 125* 124* 170*  BUN 29* 43* 40* 41* 50*  CREATININE 6.22* 7.80* 7.22* 7.30* 8.40*  CALCIUM 8.3* 8.1* 8.2* 8.6* 8.8*  MG 1.5*  --   --  1.5* 1.9  PHOS 5.0* 5.5*  --  3.7 4.4   GFR: Estimated Creatinine Clearance: 8.9 mL/min (A) (by C-G formula based on SCr of 8.4 mg/dL (H)). Liver Function Tests: Recent Labs  Lab 03/23/20 0929  ALBUMIN 3.3*   No results for input(s): LIPASE, AMYLASE in the last  168 hours. No results for input(s): AMMONIA in the last 168 hours. Coagulation Profile: No results for input(s): INR, PROTIME in the last 168 hours. Cardiac Enzymes: No results for input(s): CKTOTAL, CKMB, CKMBINDEX, TROPONINI in the last 168 hours. BNP (last 3 results) No results for input(s): PROBNP in the last 8760 hours. HbA1C: No results for input(s): HGBA1C in the last 72 hours. CBG: Recent Labs  Lab 03/27/20 1659 03/27/20 2057 03/28/20 0441 03/28/20 0833 03/28/20 1133  GLUCAP 142* 161* 165* 155* 158*   Lipid Profile: No results for input(s): CHOL, HDL, LDLCALC, TRIG, CHOLHDL, LDLDIRECT in the last 72 hours. Thyroid Function Tests: No results for input(s): TSH, T4TOTAL, FREET4, T3FREE, THYROIDAB in the last 72 hours. Anemia Panel: No results for input(s): VITAMINB12, FOLATE, FERRITIN, TIBC, IRON, RETICCTPCT in the last 72 hours. Sepsis Labs: No results for input(s): PROCALCITON, LATICACIDVEN in the last 168 hours.  Recent Results (from the past 240 hour(s))  SARS Coronavirus 2 by RT PCR (hospital order, performed in [facility] hospital lab) Nasopharyngeal Nasopharyngeal Swab     Status: None   Collection Time: 03/20/20  9:04 AM   Specimen: Nasopharyngeal Swab  Result Value Ref Range Status   SARS Coronavirus 2 NEGATIVE NEGATIVE Final    Comment: (NOTE) SARS-CoV-2 target nucleic acids are NOT DETECTED.  The SARS-CoV-2 RNA is generally detectable in upper and lower respiratory specimens during the acute phase of infection. The lowest concentration of SARS-CoV-2 viral copies this assay can detect is 250 copies / mL. A negative result does not preclude SARS-CoV-2 infection and should not be used as the sole basis for treatment or other patient management decisions.  A negative result may occur with improper specimen collection / handling, submission of specimen other than nasopharyngeal swab, presence of viral mutation(s) within the areas targeted by this assay, and  inadequate number of viral copies (<250 copies / mL). A negative result must be combined with clinical observations, patient history, and epidemiological information.  Fact Sheet for Patients:   StrictlyIdeas.no  Fact Sheet for Healthcare Providers: BankingDealers.co.za  This test is not yet approved or  cleared by the [place] FDA and has been authorized for detection and/or diagnosis of SARS-CoV-2 by FDA under an Emergency Use Authorization (EUA).  This EUA will remain in effect (meaning this test can be used) for the duration of the COVID-19 declaration under Section 564(b)(1) of the Act, 21 U.S.C. section 360bbb-3(b)(1), unless the authorization is terminated or revoked sooner.  Performed at [facility], [address], [place], [place] [address]     Radiology Studies: CT HEAD WO  CONTRAST  Result Date: 03/27/2020 CLINICAL DATA:  Head trauma. Intracranial hemorrhage. Fall 7 days prior EXAM: CT HEAD WITHOUT CONTRAST TECHNIQUE: Contiguous axial images were obtained from the base of the skull through the vertex without intravenous contrast. COMPARISON:  Head CT 03/24/2020 is FINDINGS: Brain: Parenchymal hemorrhage in the LEFT temporal operculum again noted. Hemorrhage measures approximately 2.6 by 2.1 cm unchanged from 2.6 x 2.0 cm. Visually hemorrhage appears unchanged. There is associated edema more medially within the LEFT upper lobe. No intraventricular hemorrhage. No midline shift. Basal cisterns are patent. Shallow subdural hematoma noted posteriorly over the LEFT occipital lobe measuring 2 mm (image 14/3) unchanged. Vascular: No hyperdense vessel or unexpected calcification. Skull: No associated skull fracture with the LEFT intraparenchymal hemorrhage. No skull base fracture. Sinuses/Orbits: Complete opacification of the LEFT maxillary sinus with chronic bowing of the medial wall. Other: None IMPRESSION: 1. No interval  change in a parenchymal hemorrhage within the LEFT temporal operculum. 2. Shallow subdural hematoma over the LEFT cerebral convexity unchanged. 3. No midline shift or mass effect. Basilar cisterns are patent. 4. Chronic opacification of the LEFT maxillary sinus. Electronically Signed   By: Suzy Bouchard M.D.   On: 03/27/2020 07:53   Scheduled Meds: . amoxicillin-clavulanate  1 tablet Oral Q24H  . atorvastatin  10 mg Oral Daily  . b complex-vitamin c-folic acid  1 tablet Oral Daily  . calcium acetate  1,334 mg Oral TID WC  . Chlorhexidine Gluconate Cloth  6 each Topical Daily  . gabapentin  100 mg Oral BID  . insulin aspart  0-6 Units Subcutaneous TID WC  . levETIRAcetam  500 mg Oral BID  . levothyroxine  200 mcg Oral QAC breakfast  . lidocaine-prilocaine  1 application Topical Q T,Th,Sa-HD  . lipase/protease/amylase  24,000 Units Oral TID WC  . lisinopril  5 mg Oral Daily  . metoprolol succinate  50 mg Oral Daily  . pantoprazole  40 mg Oral Daily  . sodium chloride flush  3 mL Intravenous Q12H  . sodium chloride flush  3 mL Intravenous Q12H  . traZODone  100 mg Oral QHS   Continuous Infusions:   LOS: 6 days    Time spent: 25 mins    Shawna Clamp, MD [facility]   If 7PM-7AM, please contact night-coverage

## 2020-03-28 NOTE — Progress Notes (Signed)
Attempted to call report to 1A. States they dont have that room assigned and the charge nurse is not on the floor. Will call me back

## 2020-03-29 DIAGNOSIS — R55 Syncope and collapse: Secondary | ICD-10-CM | POA: Diagnosis not present

## 2020-03-29 LAB — BASIC METABOLIC PANEL
Anion gap: 14 (ref 5–15)
BUN: 27 mg/dL — ABNORMAL HIGH (ref 8–23)
CO2: 28 mmol/L (ref 22–32)
Calcium: 8.6 mg/dL — ABNORMAL LOW (ref 8.9–10.3)
Chloride: 95 mmol/L — ABNORMAL LOW (ref 98–111)
Creatinine, Ser: 5.46 mg/dL — ABNORMAL HIGH (ref 0.61–1.24)
GFR, Estimated: 10 mL/min — ABNORMAL LOW (ref >60)
Glucose, Bld: 98 mg/dL (ref 70–99)
Potassium: 4.6 mmol/L (ref 3.5–5.1)
Sodium: 137 mmol/L (ref 135–145)

## 2020-03-29 LAB — CBC
HCT: 31.2 % — ABNORMAL LOW (ref 39.0–52.0)
Hemoglobin: 10.4 g/dL — ABNORMAL LOW (ref 13.0–17.0)
MCH: 31.8 pg (ref 26.0–34.0)
MCHC: 33.3 g/dL (ref 30.0–36.0)
MCV: 95.4 fL (ref 80.0–100.0)
Platelets: 134 10*3/uL — ABNORMAL LOW (ref 150–400)
RBC: 3.27 MIL/uL — ABNORMAL LOW (ref 4.22–5.81)
RDW: 13.7 % (ref 11.5–15.5)
WBC: 5.8 10*3/uL (ref 4.0–10.5)
nRBC: 0 % (ref 0.0–0.2)

## 2020-03-29 LAB — GLUCOSE, CAPILLARY
Glucose-Capillary: 88 mg/dL (ref 70–99)
Glucose-Capillary: 85 mg/dL (ref 70–99)
Glucose-Capillary: 139 mg/dL — ABNORMAL HIGH (ref 70–99)
Glucose-Capillary: 142 mg/dL — ABNORMAL HIGH (ref 70–99)
Glucose-Capillary: 179 mg/dL — ABNORMAL HIGH (ref 70–99)

## 2020-03-29 LAB — MAGNESIUM: Magnesium: 1.8 mg/dL (ref 1.7–2.4)

## 2020-03-29 LAB — PHOSPHORUS: Phosphorus: 3 mg/dL (ref 2.5–4.6)

## 2020-03-29 MED ORDER — ASPIRIN 81 MG PO CHEW
81.0000 mg | CHEWABLE_TABLET | ORAL | Status: AC
  Administered 2020-03-30: 81 mg via ORAL
  Filled 2020-03-29: qty 1

## 2020-03-29 MED ORDER — SODIUM CHLORIDE 0.9 % WEIGHT BASED INFUSION: 3.0000 mL/kg/h | INTRAVENOUS | Status: DC

## 2020-03-29 MED ORDER — SODIUM CHLORIDE 0.9% FLUSH: 3.0000 mL | INTRAVENOUS | Status: DC | PRN

## 2020-03-29 MED ORDER — HYDRALAZINE HCL 25 MG PO TABS
25.0000 mg | ORAL_TABLET | Freq: Three times a day (TID) | ORAL | Status: DC
  Administered 2020-03-29 – 2020-03-30 (×4): 25 mg via ORAL
  Filled 2020-03-29 (×4): qty 1

## 2020-03-29 MED ORDER — SODIUM CHLORIDE 0.9 % WEIGHT BASED INFUSION: 1.0000 mL/kg/h | INTRAVENOUS | Status: DC

## 2020-03-29 MED ORDER — SODIUM CHLORIDE 0.9 % IV SOLN: 250.0000 mL | INTRAVENOUS | Status: DC | PRN

## 2020-03-29 NOTE — Plan of Care (Signed)

## 2020-03-29 NOTE — Progress Notes (Signed)
PT Cancellation Note  Patient Details Name: Kyle [name] MRN: [id number] DOB: 07/30/1950   Cancelled Treatment:     PT attempt. Pt very lethargic. Awaiting palliative consult to establish POC going forward. Acute PT will continue to closely monitor and treat per current POC.    Willette Pa 03/29/2020, 4:36 PM

## 2020-03-29 NOTE — Progress Notes (Signed)
PROGRESS NOTE    Kyle [name]  VZC:[id number] DOB: 06/21/1950 DOA: 03/20/2020 PCP: Idelle Crouch, MD   Brief Narrative:  This 70 years old male with PMH significant for ESRD on dialysis MWF, CVA, chronic diastolic heart failure, COPD, PAD, OSA, history of GI bleed, insulin-dependent diabetes, hyperlipidemia presents to the ER for the evaluation of fall following syncopal episode.  Patient has developed huge hematoma over his right eye after he was found on the floor.  Patient does not remember how did this happen. CT head and cervical spine shows small volume subarachnoid hemorrhage and possible subdural hemorrhage, along the superficial temporal lobe.  No significant mass-effect. Of note patient was recently discharged from hospital about 3 days ago where he was evaluated for GI bleed.   Patient is admitted for possible syncopal episode, Neurosurgery, Cardiology and Nephrology consults were called.  Patient underwent hemodialysis on 1/24.  Renal status is stable.  Cardiology is recommending Linq device for detection of heart arrhythmias causing syncope. Neurosurgery recommended no intervention.  Assessment & Plan:   Principal Problem:   Syncope and collapse Active Problems:   Chronic obstructive pulmonary disease (HCC)   Seizure disorder (HCC)   Hypothyroidism   ESRD (end stage renal disease) on dialysis (HCC)   Falls frequently   Depression   Chronic diastolic CHF (congestive heart failure) (HCC)   SAH (subarachnoid hemorrhage) (HCC)   Chronic sinusitis   Fall   Syncope and collapse: Patient was found on the floor by his wife following a fall. Patient does not remember the events and wife did not see any jerking motions suggestive of a seizure. He was not incontinent of urine or feces. 2D echo: LVEF 35 to 40%.  Left ventricle has decreased function.  There is global hypokinesis. Cardiology -> Patient does not have any telemetry changes or rhythm disturbance consistent  with episode of syncope.   Recommended Linq device but wife not agreeable per cardio note. Wife declined to have Linq device. Cardio: No further workup needed.   Subarachnoid bleed, traumatic Traumatic andfollowing a fall Neurosurgery seen and recommends hold Plavix and anticoagulants. Repeat CT head shows stable subarachnoid hemorrhage.  Hold Plavix for 7 days after fall , hold DVT prophylaxis for 48 hours.  Follow-up in neurosurgery clinic in 2 to 3 weeks. 1/31: Patient had another fall in the hospital, Repeat Ct head showed stable hematoma.  Acute metabolic encephalopathy that could be due to [facility]: Patient remains intermittently confused since arrival. Some slowed speech/delayed response UA: unremarkable, vitals are stable. Most likely could be due to subarachnoid hemorrhage. No source of infection found.  Labs unremarkable. Nursing/PT concern on 1/27 -> slurred speech and delayed responses -  Neuro input appreciated- repeat CT head unchanged, EEG normal.  Seizure disorder: Chronic and stable. Continue Keppra 500 mg twice a day Continue seizure precautions EEG normal  History of CVA with left-sided weakness Continuemetoprololand statins. Hold Plavix due to subarachnoid bleed.   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 Sliding scale coverage with insulin Dialysis daysare M/W/F HD per nephro.  Depression Continue trazodone and Lexapro.  Hypertension -on metoprolol Lisinopril was added on 1/28 for better blood pressure control. 2/1: Hydralazine 25 mg three times daily added   Hypothyroidism Continue Synthroid  Hypomagnesemia Supplement magnesium  Frequent falls Patient's wife is concerned about frequent falls at home with increased risk for fractures. 1/30: Patient fell early morning,  found alongside the bed.  Denies any head trauma,  CT head stat unchanged. PT, OT ->  SNF  Chronic sinusitis Patient had a CT scan of the head and cervical spine which showed chronic sinusitis from left OMUobstruction with complicated disease at the left maxillary sinus with chronic defect in the posterior wall. Discussed with ENT Dr. Richardson Landry who recommended empiric antibiotic coverage with Augmentin and to have patient follow-up with Dr.Juengelas an outpatient to discuss surgical management. Upon discharge patient will need appointment to follow-up with Dr. Kathyrn Sheriff   Palliative care input appreciated - PC to follow at SNF.   DVT prophylaxis: SCDs Code Status: DNR Family Communication:  Spoke with wife on phone. Disposition Plan:   Status is: Inpatient  Remains inpatient appropriate because:Inpatient level of care appropriate due to severity of illness   Dispo: The patient is from: Home              Anticipated d/c is to: SNF              Anticipated d/c date is: > 3 days              Patient currently is not medically stable to d/c.   Difficult to place patient No    Consultants:   Cardiology  Neurosurgery  Neurology  Procedures: None Antimicrobials:   Anti-infectives (From admission, onward)   Start     Dose/Rate Route Frequency Ordered Stop   03/21/20 1800  amoxicillin-clavulanate (AUGMENTIN) 500-125 MG per tablet 500 mg        1 tablet Oral Every 24 hours 03/20/20 1416     03/20/20 1300  amoxicillin-clavulanate (AUGMENTIN) 875-125 MG per tablet 1 tablet  Status:  Discontinued        1 tablet Oral Every 24 hours 03/20/20 1213 03/20/20 1416      Subjective: Patient was seen and examined at bedside. Overnight events noted.   Patient seems at his baseline mental status.  He answers yes to all questions asked. Patient has resolving hematoma around right eye.    Objective: Vitals:   03/28/20 2300 03/29/20 0302 03/29/20 0731 03/29/20 1135  BP: (!) 177/77 (!) 172/78 (!) 168/71 (!) 155/73  Pulse: 73 67 70 72  Resp: 18 18 17 19   Temp: 97.9  F (36.6 C) 97.6 F (36.4 C) 97.8 F (36.6 C) 97.9 F (36.6 C)  TempSrc: Oral  Oral Oral  SpO2: 100% 99% 99% 98%  Weight:      Height:        Intake/Output Summary (Last 24 hours) at 03/29/2020 1218 Last data filed at 03/29/2020 1021 Gross per 24 hour  Intake 240 ml  Output 2375 ml  Net -2135 ml   Filed Weights   03/23/20 0408 03/25/20 0551 03/26/20 0422  Weight: 80.8 kg 81.2 kg 80.7 kg    Examination:  General exam: Appears calm and comfortable, hematoma noted around right eye Respiratory system: Clear to auscultation. Respiratory effort normal. Cardiovascular system: S1 & S2 heard, RRR. No JVD, murmurs, rubs, gallops or clicks. No pedal edema. Gastrointestinal system: Abdomen is nondistended, soft and nontender. No organomegaly or masses felt. Normal bowel sounds heard. Central nervous system: Alert and oriented. No focal neurological deficits. Extremities: No edema, no cyanosis, no clubbing. Skin: No rashes, lesions or ulcers Psychiatry: Not assessed    Data Reviewed: I have personally reviewed following labs and imaging studies  CBC: Recent Labs  Lab 03/23/20 0929 03/25/20 0605 03/27/20 0439 03/28/20 0446 03/29/20 0511  WBC 6.2 5.7 5.8 7.5 5.8  HGB 10.3* 10.2* 10.3*  10.1* 10.4*  HCT 32.6* 30.8* 31.9* 31.7* 31.2*  MCV 97.3 95.7 97.0 97.2 95.4  PLT 129* 148* 142* 149* 476*   Basic Metabolic Panel: Recent Labs  Lab 03/23/20 0929 03/25/20 0605 03/27/20 0439 03/28/20 0446 03/29/20 0511  NA 134* 136 134* 132* 137  K 4.9 4.3 4.8 5.4* 4.6  CL 94* 97* 96* 93* 95*  CO2 25 26 25 24 28   GLUCOSE 144* 125* 124* 170* 98  BUN 43* 40* 41* 50* 27*  CREATININE 7.80* 7.22* 7.30* 8.40* 5.46*  CALCIUM 8.1* 8.2* 8.6* 8.8* 8.6*  MG  --   --  1.5* 1.9 1.8  PHOS 5.5*  --  3.7 4.4 3.0   GFR: Estimated Creatinine Clearance: 13.6 mL/min (A) (by C-G formula based on SCr of 5.46 mg/dL (H)). Liver Function Tests: Recent Labs  Lab 03/23/20 0929  ALBUMIN 3.3*   No results  for input(s): LIPASE, AMYLASE in the last 168 hours. No results for input(s): AMMONIA in the last 168 hours. Coagulation Profile: No results for input(s): INR, PROTIME in the last 168 hours. Cardiac Enzymes: No results for input(s): CKTOTAL, CKMB, CKMBINDEX, TROPONINI in the last 168 hours. BNP (last 3 results) No results for input(s): PROBNP in the last 8760 hours. HbA1C: No results for input(s): HGBA1C in the last 72 hours. CBG: Recent Labs  Lab 03/28/20 1705 03/28/20 2356 03/29/20 0728 03/29/20 0809 03/29/20 1203  GLUCAP 112* 88 88 85 139*   Lipid Profile: No results for input(s): CHOL, HDL, LDLCALC, TRIG, CHOLHDL, LDLDIRECT in the last 72 hours. Thyroid Function Tests: No results for input(s): TSH, T4TOTAL, FREET4, T3FREE, THYROIDAB in the last 72 hours. Anemia Panel: No results for input(s): VITAMINB12, FOLATE, FERRITIN, TIBC, IRON, RETICCTPCT in the last 72 hours. Sepsis Labs: No results for input(s): PROCALCITON, LATICACIDVEN in the last 168 hours.  Recent Results (from the past 240 hour(s))  SARS Coronavirus 2 by RT PCR (hospital order, performed in [facility] hospital lab) Nasopharyngeal Nasopharyngeal Swab     Status: None   Collection Time: 03/20/20  9:04 AM   Specimen: Nasopharyngeal Swab  Result Value Ref Range Status   SARS Coronavirus 2 NEGATIVE NEGATIVE Final    Comment: (NOTE) SARS-CoV-2 target nucleic acids are NOT DETECTED.  The SARS-CoV-2 RNA is generally detectable in upper and lower respiratory specimens during the acute phase of infection. The lowest concentration of SARS-CoV-2 viral copies this assay can detect is 250 copies / mL. A negative result does not preclude SARS-CoV-2 infection and should not be used as the sole basis for treatment or other patient management decisions.  A negative result may occur with improper specimen collection / handling, submission of specimen other than nasopharyngeal swab, presence of viral mutation(s) within  the areas targeted by this assay, and inadequate number of viral copies (<250 copies / mL). A negative result must be combined with clinical observations, patient history, and epidemiological information.  Fact Sheet for Patients:   StrictlyIdeas.no  Fact Sheet for Healthcare Providers: BankingDealers.co.za  This test is not yet approved or  cleared by the [place] FDA and has been authorized for detection and/or diagnosis of SARS-CoV-2 by FDA under an Emergency Use Authorization (EUA).  This EUA will remain in effect (meaning this test can be used) for the duration of the COVID-19 declaration under Section 564(b)(1) of the Act, 21 U.S.C. section 360bbb-3(b)(1), unless the authorization is terminated or revoked sooner.  Performed at [facility], [address] [place], [place] [address]  Radiology Studies: No results found. Scheduled Meds: . amoxicillin-clavulanate  1 tablet Oral Q24H  . atorvastatin  10 mg Oral Daily  . b complex-vitamin c-folic acid  1 tablet Oral Daily  . calcium acetate  1,334 mg Oral TID WC  . Chlorhexidine Gluconate Cloth  6 each Topical Daily  . gabapentin  100 mg Oral BID  . hydrALAZINE  25 mg Oral Q8H  . insulin aspart  0-6 Units Subcutaneous TID WC  . levETIRAcetam  500 mg Oral BID  . levothyroxine  200 mcg Oral QAC breakfast  . lidocaine-prilocaine  1 application Topical Q T,Th,Sa-HD  . lipase/protease/amylase  24,000 Units Oral TID WC  . lisinopril  5 mg Oral Daily  . metoprolol succinate  50 mg Oral Daily  . pantoprazole  40 mg Oral Daily  . sodium chloride flush  3 mL Intravenous Q12H  . sodium chloride flush  3 mL Intravenous Q12H  . traZODone  100 mg Oral QHS   Continuous Infusions:   LOS: 7 days    Time spent: 25 mins    Shawna Clamp, MD [facility]   If 7PM-7AM, please contact night-coverage

## 2020-03-29 NOTE — Progress Notes (Signed)
Occupational Therapy Treatment Patient Details Name: Kyle [name] MRN: [id number] DOB: 02-17-1951 Today's Date: 03/29/2020    History of present illness Patient is a 70 year old male with PMH of ESRD on dialysis MWF, CVA with left side weakness , chronic diastolic heart failure, COPD, PAD, OSA, history of GI bleed, insulin-dependent diabetes, hyperlipidemia presents to the ER following syncopal episode and fall.  Patient has developed huge hematoma over his right eye after he was found on the floor. CT head and cervical spine shows small volume subarachnoid hemorrhage and possible subdural hemorrhage, along the superficial temporal lobe.   OT comments  Pt seen for OT tx this date. Pt alert, oriented to self, and demo's continued difficulty with comprehension and requiring MAX simple multimodal cues to support understanding of tasks. Long sitting in bed, pt required multimodal cues to initiate ADL tasks. With set up, pt was able to open toothpaste cap, apply toothpaste, and brush teeth with supervision for safety. Pt did require cues for spitting into cup versus drinking water and ultimately unable to differentiate between a full cup with straw with water versus the empty cup for spitting, attempting to drink from empty cup before requiring cues to redirect. With set up of remote, pt able to manage changing channels without assist. RN/unit secretary notified of bed alarm on low bed not working. Pt continues to benefit from skilled OT services. Pt unsafe to return home; continue to recommend SNF At this time.    Follow Up Recommendations  SNF;Supervision/Assistance - 24 hour    Equipment Recommendations  None recommended by OT    Recommendations for Other Services      Precautions / Restrictions Precautions Precautions: Fall Restrictions Weight Bearing Restrictions: No       Mobility Bed Mobility               General bed mobility comments: deferred 2/2 decreased  cognition/ability to safely follow commands  Transfers                      Balance                                           ADL either performed or assessed with clinical judgement   ADL Overall ADL's : Needs assistance/impaired     Grooming: Wash/dry face;Oral care;Sitting;Cueing for safety;Cueing for sequencing Grooming Details (indicate cue type and reason): Long sitting in bed, pt required verbal and visual cues to initiate. With set up, pt was able to open toothpaste cap, apply toothpaste, and brush teeth with supervision for safety. Pt did require cues for spitting into cup versus drinking water and ultimately unable to differentiate between a full cup with straw with water versus the empty cup for spitting, attempting to drink from empty cup before requiring cues to redirect.                                     Vision Patient Visual Report: No change from baseline     Perception     Praxis      Cognition Arousal/Alertness: Awake/alert Behavior During Therapy: Flat affect Overall Cognitive Status: No family/caregiver present to determine baseline cognitive functioning  General Comments: alert, oriented to name, significant difficulty understanding simple questions, requiring max multimodal cues for tasks        Exercises     Shoulder Instructions       General Comments      Pertinent Vitals/ Pain       Pain Assessment: Faces Faces Pain Scale: No hurt  Home Living                                          Prior Functioning/Environment              Frequency  Min 1X/week        Progress Toward Goals  OT Goals(current goals can now be found in the care plan section)  Progress towards OT goals: Progressing toward goals  Acute Rehab OT Goals Patient Stated Goal: patient unable to state OT Goal Formulation: With patient Time For Goal  Achievement: 04/05/20 Potential to Achieve Goals: Good  Plan Discharge plan remains appropriate;Frequency remains appropriate    Co-evaluation                 AM-PAC OT "6 Clicks" Daily Activity     Outcome Measure   Help from another person eating meals?: A Lot Help from another person taking care of personal grooming?: A Little Help from another person toileting, which includes using toliet, bedpan, or urinal?: A Lot Help from another person bathing (including washing, rinsing, drying)?: A Lot Help from another person to put on and taking off regular upper body clothing?: A Lot Help from another person to put on and taking off regular lower body clothing?: A Lot 6 Click Score: 13    End of Session    OT Visit Diagnosis: Repeated falls (R29.6);Muscle weakness (generalized) (M62.81);History of falling (Z91.81);Other abnormalities of gait and mobility (R26.89);Other symptoms and signs involving cognitive function   Activity Tolerance Patient tolerated treatment well   Patient Left in bed;with call bell/phone within reach;Other (comment) (bed alarm not working, Teacher, English as a foreign language notified)   Nurse Communication Other (comment) (bed alarm not working)        Time: [phone] OT Time Calculation (min): 12 min  Charges: OT General Charges $OT Visit: 1 Visit OT Treatments $Self Care/Home Management : 8-22 mins  Jeni Salles, MPH, MS, OTR/L ascom [phone] 03/29/20, 10:51 AM

## 2020-03-29 NOTE — Progress Notes (Signed)
[facility]  ROUNDING NOTE   Subjective:   [place] of stay: 7  Doing fair.  Sitting in the bed Oriented only to self Able to answer questions but confused   Objective:  Vital signs in last 24 hours:  Temp:  [97.6 F (36.4 C)-97.9 F (36.6 C)] 97.9 F (36.6 C) (02/01 1135) Pulse Rate:  [66-74] 72 (02/01 1135) Resp:  [14-19] 19 (02/01 1135) BP: (150-177)/(63-96) 155/73 (02/01 1135) SpO2:  [97 %-100 %] 98 % (02/01 1135)  Weight change:  Filed Weights   03/23/20 0408 03/25/20 0551 03/26/20 0422  Weight: 80.8 kg 81.2 kg 80.7 kg    Intake/Output: I/O last 3 completed shifts: In: -  Out: 2000 [Other:2000]   Intake/Output this shift:  Total I/O In: 480 [P.O.:480] Out: 375 [Urine:375]  Physical Exam: General: NAD, laying in bed  Head: Right ecchymosis+. Moist oral mucosal membranes  Lungs:  Clear to auscultation  Heart: Regular rate and rhythm  Abdomen:  Soft, nontender,   Extremities:  No peripheral edema.  Neurologic: oriented only to self  Skin: abrasions on his right arm   Access: Left upper extremity AVF, +thrill      Basic Metabolic Panel: Recent Labs  Lab 03/23/20 0929 03/25/20 0605 03/27/20 0439 03/28/20 0446 03/29/20 0511  NA 134* 136 134* 132* 137  K 4.9 4.3 4.8 5.4* 4.6  CL 94* 97* 96* 93* 95*  CO2 25 26 25 24 28   GLUCOSE 144* 125* 124* 170* 98  BUN 43* 40* 41* 50* 27*  CREATININE 7.80* 7.22* 7.30* 8.40* 5.46*  CALCIUM 8.1* 8.2* 8.6* 8.8* 8.6*  MG  --   --  1.5* 1.9 1.8  PHOS 5.5*  --  3.7 4.4 3.0    Liver Function Tests: Recent Labs  Lab 03/23/20 0929  ALBUMIN 3.3*   No results for input(s): LIPASE, AMYLASE in the last 168 hours. No results for input(s): AMMONIA in the last 168 hours.  CBC: Recent Labs  Lab 03/23/20 0929 03/25/20 0605 03/27/20 0439 03/28/20 0446 03/29/20 0511  WBC 6.2 5.7 5.8 7.5 5.8  HGB 10.3* 10.2* 10.3* 10.1* 10.4*  HCT 32.6* 30.8* 31.9* 31.7* 31.2*  MCV 97.3 95.7 97.0 97.2 95.4  PLT 129*  148* 142* 149* 134*    Cardiac Enzymes: No results for input(s): CKTOTAL, CKMB, CKMBINDEX, TROPONINI in the last 168 hours.  BNP: Invalid input(s): POCBNP  CBG: Recent Labs  Lab 03/28/20 1705 03/28/20 2356 03/29/20 0728 03/29/20 0809 03/29/20 1203  GLUCAP 112* 88 88 85 139*    Microbiology: Results for orders placed or performed during the hospital encounter of 03/20/20  SARS Coronavirus 2 by RT PCR (hospital order, performed in [facility] hospital lab) Nasopharyngeal Nasopharyngeal Swab     Status: None   Collection Time: 03/20/20  9:04 AM   Specimen: Nasopharyngeal Swab  Result Value Ref Range Status   SARS Coronavirus 2 NEGATIVE NEGATIVE Final    Comment: (NOTE) SARS-CoV-2 target nucleic acids are NOT DETECTED.  The SARS-CoV-2 RNA is generally detectable in upper and lower respiratory specimens during the acute phase of infection. The lowest concentration of SARS-CoV-2 viral copies this assay can detect is 250 copies / mL. A negative result does not preclude SARS-CoV-2 infection and should not be used as the sole basis for treatment or other patient management decisions.  A negative result may occur with improper specimen collection / handling, submission of specimen other than nasopharyngeal swab, presence of viral mutation(s) within the areas targeted by this assay,  and inadequate number of viral copies (<250 copies / mL). A negative result must be combined with clinical observations, patient history, and epidemiological information.  Fact Sheet for Patients:   StrictlyIdeas.no  Fact Sheet for Healthcare Providers: BankingDealers.co.za  This test is not yet approved or  cleared by the [place] FDA and has been authorized for detection and/or diagnosis of SARS-CoV-2 by FDA under an Emergency Use Authorization (EUA).  This EUA will remain in effect (meaning this test can be used) for the duration of  the COVID-19 declaration under Section 564(b)(1) of the Act, 21 U.S.C. section 360bbb-3(b)(1), unless the authorization is terminated or revoked sooner.  Performed at [facility], [address] [place], [place] [address]     Coagulation Studies: No results for input(s): LABPROT, INR in the last 72 hours.  Urinalysis: No results for input(s): COLORURINE, LABSPEC, PHURINE, GLUCOSEU, HGBUR, BILIRUBINUR, KETONESUR, PROTEINUR, UROBILINOGEN, NITRITE, LEUKOCYTESUR in the last 72 hours.  Invalid input(s): APPERANCEUR    Imaging: No results found.   Medications:    . amoxicillin-clavulanate  1 tablet Oral Q24H  . atorvastatin  10 mg Oral Daily  . calcium acetate  1,334 mg Oral TID WC  . Chlorhexidine Gluconate Cloth  6 each Topical Daily  . gabapentin  100 mg Oral BID  . hydrALAZINE  25 mg Oral Q8H  . insulin aspart  0-6 Units Subcutaneous TID WC  . levETIRAcetam  500 mg Oral BID  . levothyroxine  200 mcg Oral QAC breakfast  . lidocaine-prilocaine  1 application Topical Q T,Th,Sa-HD  . lipase/protease/amylase  24,000 Units Oral TID WC  . lisinopril  5 mg Oral Daily  . metoprolol succinate  50 mg Oral Daily  . multivitamin  1 tablet Oral Daily  . pantoprazole  40 mg Oral Daily  . sodium chloride flush  3 mL Intravenous Q12H  . sodium chloride flush  3 mL Intravenous Q12H  . traZODone  100 mg Oral QHS   acetaminophen **OR** acetaminophen  Assessment/ Plan:  Kyle [name] is a 70 y.o. white male with end stage renal disease on hemodialysis, CVA with residual left sided weakness, CVA, hypertension, seizure disorder, diabetes mellitus type II, hypertension who was admitted to [facility] on 03/20/2020 for Syncope and collapse [R55] Subdural hematoma ([place]) [S06.5X9A] Fall, initial encounter [W19.XXXA] Syncope, unspecified syncope type [R55] Subarachnoid hematoma with loss of consciousness, initial encounter [facility]) [S06.6X9A]  Patient was found by his wife after a  fall. Patient reports multiple falls at home. Found to have subarachnoid hemorrhage and cerebral contusion. Patient disoriented but mental status is improving. Synocope work up by cardiology negative.   [facility] MWF [address] Left AVF 79.5kg  1.  ESRD on HD MWF.   We will schedule next hemodialysis for tomorrow No heparin  2.  Anemia of chronic kidney disease:  Lab Results  Component Value Date   HGB 10.4 (L) 03/29/2020   monitor  Epogen for hemoglobin less than 10  3.  Secondary hyperparathyroidism. Calcium and phosphorus at goal.  -  Continue calcium acetate with meals  4.  other issues S/p fall from syncope CT head 1/30= left temporal parenchymal hemorrhage, shallow subdural hematoma, no midline shifts Chronic sinusitis    LOS: 7 Sharmeka Palmisano 2/1/20223:06 PM

## 2020-03-29 NOTE — TOC Progression Note (Signed)
Transition of Care [facility]) - Progression Note    Patient Details  Name: Kyle [name] MRN: [id number] Date of Birth: 02-Feb-1951  Transition of Care [facility]) CM/SW [place], RN Phone Number: 03/29/2020, 1:49 PM  Clinical Narrative:   Patient still with no SNF bed offers. Pending results of Palliative consult bed search may need to be extended.     Expected Discharge Plan: [place] Barriers to Discharge: Continued Medical Work up  Expected Discharge Plan and Services Expected Discharge Plan: [place] In-house Referral: Clinical Social Work   Post Acute Care Choice:  (undetermined at this time) Living arrangements for the past 2 months: Single Family Home                                       Social Determinants of Health (SDOH) Interventions    Readmission Risk Interventions Readmission Risk Prevention Plan 03/27/2020 01/26/2020  Transportation Screening Complete Complete  Medication Review Press photographer) (No Data) Complete  PCP or Specialist appointment within 3-5 days of discharge Complete Complete  HRI or Home Care Consult Complete Complete  SW Recovery Care/Counseling Consult Complete Complete  Palliative Care Screening Not Applicable Not [place] Not Complete Complete  SNF Comments waiting on bed offers -  Some recent data might be hidden

## 2020-03-30 DIAGNOSIS — N186 End stage renal disease: Secondary | ICD-10-CM | POA: Diagnosis not present

## 2020-03-30 DIAGNOSIS — Z7189 Other specified counseling: Secondary | ICD-10-CM | POA: Diagnosis not present

## 2020-03-30 DIAGNOSIS — J42 Unspecified chronic bronchitis: Secondary | ICD-10-CM | POA: Diagnosis not present

## 2020-03-30 DIAGNOSIS — R55 Syncope and collapse: Secondary | ICD-10-CM | POA: Diagnosis not present

## 2020-03-30 LAB — BASIC METABOLIC PANEL
Anion gap: 15 (ref 5–15)
BUN: 38 mg/dL — ABNORMAL HIGH (ref 8–23)
CO2: 26 mmol/L (ref 22–32)
Calcium: 8.5 mg/dL — ABNORMAL LOW (ref 8.9–10.3)
Chloride: 96 mmol/L — ABNORMAL LOW (ref 98–111)
Creatinine, Ser: 6.85 mg/dL — ABNORMAL HIGH (ref 0.61–1.24)
GFR, Estimated: 8 mL/min — ABNORMAL LOW (ref >60)
Glucose, Bld: 115 mg/dL — ABNORMAL HIGH (ref 70–99)
Potassium: 5.1 mmol/L (ref 3.5–5.1)
Sodium: 137 mmol/L (ref 135–145)

## 2020-03-30 LAB — CBC
HCT: 31.6 % — ABNORMAL LOW (ref 39.0–52.0)
Hemoglobin: 10.1 g/dL — ABNORMAL LOW (ref 13.0–17.0)
MCH: 31.2 pg (ref 26.0–34.0)
MCHC: 32 g/dL (ref 30.0–36.0)
MCV: 97.5 fL (ref 80.0–100.0)
Platelets: 133 10*3/uL — ABNORMAL LOW (ref 150–400)
RBC: 3.24 MIL/uL — ABNORMAL LOW (ref 4.22–5.81)
RDW: 13.6 % (ref 11.5–15.5)
WBC: 5.5 10*3/uL (ref 4.0–10.5)
nRBC: 0 % (ref 0.0–0.2)

## 2020-03-30 LAB — GLUCOSE, CAPILLARY
Glucose-Capillary: 135 mg/dL — ABNORMAL HIGH (ref 70–99)
Glucose-Capillary: 184 mg/dL — ABNORMAL HIGH (ref 70–99)

## 2020-03-30 MED ORDER — LORAZEPAM 2 MG/ML IJ SOLN
1.0000 mg | INTRAMUSCULAR | Status: DC | PRN
  Filled 2020-03-30: qty 1

## 2020-03-30 MED ORDER — GLYCOPYRROLATE 1 MG PO TABS
1.0000 mg | ORAL_TABLET | ORAL | Status: DC | PRN
  Filled 2020-03-30: qty 1

## 2020-03-30 MED ORDER — POLYVINYL ALCOHOL 1.4 % OP SOLN
1.0000 [drp] | Freq: Four times a day (QID) | OPHTHALMIC | Status: DC | PRN
  Filled 2020-03-30: qty 15

## 2020-03-30 MED ORDER — HYDROMORPHONE HCL 1 MG/ML IJ SOLN
0.5000 mg | INTRAMUSCULAR | Status: DC | PRN
  Filled 2020-03-30: qty 2

## 2020-03-30 MED ORDER — BIOTENE DRY MOUTH MT LIQD: 15.0000 mL | OROMUCOSAL | Status: DC | PRN

## 2020-03-30 MED ORDER — ACETAMINOPHEN 650 MG RE SUPP
650.0000 mg | Freq: Four times a day (QID) | RECTAL | Status: DC | PRN
  Filled 2020-03-30: qty 1

## 2020-03-30 MED ORDER — GLYCOPYRROLATE 0.2 MG/ML IJ SOLN
0.2000 mg | INTRAMUSCULAR | Status: DC | PRN
  Filled 2020-03-30: qty 1

## 2020-03-30 MED ORDER — ACETAMINOPHEN 325 MG PO TABS: 650.0000 mg | ORAL_TABLET | Freq: Four times a day (QID) | ORAL | Status: DC | PRN

## 2020-03-30 NOTE — Progress Notes (Signed)
[facility]) Hospital Liaison RN note:  Received request from Quinn Axe, NP for family interest in [facility]. Chart reviewed and eligibility has been approved. Spoke with spouse, [name] to confirm interest and explain services. She verbalized understanding and all questions were answered. [name] will complete registration paperwork at the [facility] once a bed is available.   Unfortunately[facility] not have a room to offer today. Family and hospital care team is aware. [place] Liaison will follow for room availability.   Please call with any hospice related questions or concerns.  Thank you for the opportunity to participate in this patient's care.  Zandra Abts, RN [facility] Liaison [phone]

## 2020-03-30 NOTE — Progress Notes (Signed)
[facility]  ROUNDING NOTE   Subjective:   [place] of stay: 8  Seen during dialysis. Resting quietly. Tolerating dialysis well   HEMODIALYSIS FLOWSHEET:  Blood Flow Rate (mL/min): 200 mL/min Arterial Pressure (mmHg): -80 mmHg Venous Pressure (mmHg): 70 mmHg Transmembrane Pressure (mmHg): 30 mmHg Ultrafiltration Rate (mL/min): 70 mL/min Dialysate Flow Rate (mL/min): 500 ml/min Conductivity: Machine : 14 Conductivity: Machine : 14 Dialysis Fluid Bolus: Normal Saline Bolus Amount (mL): 250 mL     Objective:  Vital signs in last 24 hours:  Temp:  [97.7 F (36.5 C)-98.1 F (36.7 C)] 97.9 F (36.6 C) (02/02 1041) Pulse Rate:  [63-76] 67 (02/02 1345) Resp:  [13-18] 18 (02/02 1345) BP: (142-162)/(62-76) 160/65 (02/02 1345) SpO2:  [97 %-99 %] 98 % (02/02 0750)  Weight change:  Filed Weights   03/23/20 0408 03/25/20 0551 03/26/20 0422  Weight: 80.8 kg 81.2 kg 80.7 kg    Intake/Output: I/O last 3 completed shifts: In: 720 [P.O.:720] Out: 2375 [Urine:375; Other:2000]   Intake/Output this shift:  Total I/O In: 120 [P.O.:120] Out: 500 [Other:500]  Physical Exam: General: NAD, laying in bed  Head: Right ecchymosis+. Moist oral mucosal membranes  Lungs:  Clear to auscultation  Heart: Regular rate and rhythm  Abdomen:  Soft, nontender,   Extremities:  No peripheral edema.  Neurologic:  Resting quietly   Skin: abrasions on his right arm   Access: Left upper extremity AVF, +thrill      Basic Metabolic Panel: Recent Labs  Lab 03/25/20 0605 03/27/20 0439 03/28/20 0446 03/29/20 0511 03/30/20 0521  NA 136 134* 132* 137 137  K 4.3 4.8 5.4* 4.6 5.1  CL 97* 96* 93* 95* 96*  CO2 26 25 24 28 26   GLUCOSE 125* 124* 170* 98 115*  BUN 40* 41* 50* 27* 38*  CREATININE 7.22* 7.30* 8.40* 5.46* 6.85*  CALCIUM 8.2* 8.6* 8.8* 8.6* 8.5*  MG  --  1.5* 1.9 1.8  --   PHOS  --  3.7 4.4 3.0  --     Liver Function Tests: No results for input(s): AST, ALT, ALKPHOS,  BILITOT, PROT, ALBUMIN in the last 168 hours. No results for input(s): LIPASE, AMYLASE in the last 168 hours. No results for input(s): AMMONIA in the last 168 hours.  CBC: Recent Labs  Lab 03/25/20 0605 03/27/20 0439 03/28/20 0446 03/29/20 0511 03/30/20 0521  WBC 5.7 5.8 7.5 5.8 5.5  HGB 10.2* 10.3* 10.1* 10.4* 10.1*  HCT 30.8* 31.9* 31.7* 31.2* 31.6*  MCV 95.7 97.0 97.2 95.4 97.5  PLT 148* 142* 149* 134* 133*    Cardiac Enzymes: No results for input(s): CKTOTAL, CKMB, CKMBINDEX, TROPONINI in the last 168 hours.  BNP: Invalid input(s): POCBNP  CBG: Recent Labs  Lab 03/29/20 0809 03/29/20 1203 03/29/20 1650 03/29/20 2155 03/30/20 0747  GLUCAP 85 139* 142* 179* 135*    Microbiology: Results for orders placed or performed during the hospital encounter of 03/20/20  SARS Coronavirus 2 by RT PCR (hospital order, performed in [facility] hospital lab) Nasopharyngeal Nasopharyngeal Swab     Status: None   Collection Time: 03/20/20  9:04 AM   Specimen: Nasopharyngeal Swab  Result Value Ref Range Status   SARS Coronavirus 2 NEGATIVE NEGATIVE Final    Comment: (NOTE) SARS-CoV-2 target nucleic acids are NOT DETECTED.  The SARS-CoV-2 RNA is generally detectable in upper and lower respiratory specimens during the acute phase of infection. The lowest concentration of SARS-CoV-2 viral copies this assay can detect is 250 copies /  mL. A negative result does not preclude SARS-CoV-2 infection and should not be used as the sole basis for treatment or other patient management decisions.  A negative result may occur with improper specimen collection / handling, submission of specimen other than nasopharyngeal swab, presence of viral mutation(s) within the areas targeted by this assay, and inadequate number of viral copies (<250 copies / mL). A negative result must be combined with clinical observations, patient history, and epidemiological information.  Fact Sheet for Patients:    StrictlyIdeas.no  Fact Sheet for Healthcare Providers: BankingDealers.co.za  This test is not yet approved or  cleared by the [place] FDA and has been authorized for detection and/or diagnosis of SARS-CoV-2 by FDA under an Emergency Use Authorization (EUA).  This EUA will remain in effect (meaning this test can be used) for the duration of the COVID-19 declaration under Section 564(b)(1) of the Act, 21 U.S.C. section 360bbb-3(b)(1), unless the authorization is terminated or revoked sooner.  Performed at [facility], [address] [place], [place] [address]     Coagulation Studies: No results for input(s): LABPROT, INR in the last 72 hours.  Urinalysis: No results for input(s): COLORURINE, LABSPEC, PHURINE, GLUCOSEU, HGBUR, BILIRUBINUR, KETONESUR, PROTEINUR, UROBILINOGEN, NITRITE, LEUKOCYTESUR in the last 72 hours.  Invalid input(s): APPERANCEUR    Imaging: No results found.   Medications:   . sodium chloride     . levETIRAcetam  500 mg Oral BID  . lisinopril  5 mg Oral Daily  . sodium chloride flush  3 mL Intravenous Q12H   sodium chloride, acetaminophen **OR** acetaminophen, antiseptic oral rinse, glycopyrrolate **OR** glycopyrrolate **OR** glycopyrrolate, HYDROmorphone (DILAUDID) injection, LORazepam, polyvinyl alcohol, sodium chloride flush  Assessment/ Plan:  Kyle [name] is a 70 y.o. white male with end stage renal disease on hemodialysis, CVA with residual left sided weakness, CVA, hypertension, seizure disorder, diabetes mellitus type II, hypertension who was admitted to [facility] on 03/20/2020 for Syncope and collapse [R55] Subdural hematoma ([facility]) [S06.5X9A] Fall, initial encounter [W19.XXXA] Syncope, unspecified syncope type [R55] Subarachnoid hematoma with loss of consciousness, initial encounter [facility]) [S06.6X9A]  Patient was found by his wife after a fall. Patient reports multiple falls  at home. Found to have subarachnoid hemorrhage and cerebral contusion.   [facility] MWF [address] Left AVF 79.5kg  1.  ESRD on HD MWF.   Patient seen during dialysis Tolerating well  No heparin No further HD planned as per palliative care notes. Please notify us if plan changes.  2.  Anemia of chronic kidney disease:  Lab Results  Component Value Date   HGB 10.1 (L) 03/30/2020   monitor  Epogen for hemoglobin less than 10  3.  Secondary hyperparathyroidism. Calcium and phosphorus at goal.  -  Continue calcium acetate with meals  4.  other issues S/p fall from syncope CT head 1/30= left temporal parenchymal hemorrhage, shallow subdural hematoma, no midline shifts Chronic sinusitis Palliative care notes reviewed and the plan for transition to full comfort care and no further HD    LOS: 8 Darrin Koman 2/2/20223:05 PM

## 2020-03-30 NOTE — Progress Notes (Addendum)
PROGRESS NOTE    Kyle [name]  OEV:[id number] DOB: 01-Jun-1950 DOA: 03/20/2020 PCP: Idelle Crouch, MD   Brief Narrative:  This 70 years old male with PMH significant for ESRD on dialysis MWF, CVA, chronic diastolic heart failure, COPD, PAD, OSA, history of GI bleed, insulin-dependent diabetes, hyperlipidemia presents to the ER for the evaluation of fall following syncopal episode.  Patient has developed huge hematoma over his right eye after he was found on the floor.  Patient does not remember how did this happen. CT head and cervical spine shows small volume subarachnoid hemorrhage and possible subdural hemorrhage, along the superficial temporal lobe.  No significant mass-effect. Of note patient was recently discharged from hospital about 3 days ago where he was evaluated for GI bleed.   Patient is admitted for possible syncopal episode, Neurosurgery, Cardiology and Nephrology consults were called.  Patient underwent hemodialysis on 1/24.  Renal status is stable.  Cardiology is recommending Linq device for detection of heart arrhythmias causing syncope. Neurosurgery recommended no intervention.  Palliative care consulted for goals of care discussion.  Care transitioned to full comfort care.  Assessment & Plan:   Principal Problem:   Syncope and collapse Active Problems:   Chronic obstructive pulmonary disease (HCC)   Seizure disorder (HCC)   Hypothyroidism   ESRD (end stage renal disease) on dialysis (HCC)   Falls frequently   Depression   Chronic diastolic CHF (congestive heart failure) (HCC)   SAH (subarachnoid hemorrhage) (HCC)   Chronic sinusitis   Fall   Syncope and collapse: Patient was found on the floor by his wife following a fall. Patient does not remember the events and wife did not see any jerking motions suggestive of a seizure. He was not incontinent of urine or feces. 2D echo: LVEF 35 to 40%.  Left ventricle has decreased function.  There is global  hypokinesis. Cardiology -> Patient does not have any telemetry changes or rhythm disturbance consistent with episode of syncope.   Recommended Linq device but wife not agreeable per cardio note. Wife declined to have Linq device. Cardio: No further workup needed.   Subarachnoid bleed, traumatic Traumatic andfollowing a fall Neurosurgery seen and recommends hold Plavix and anticoagulants. Repeat CT head shows stable subarachnoid hemorrhage.  Hold Plavix for 7 days after fall , hold DVT prophylaxis for 48 hours.  Follow-up in neurosurgery clinic in 2 to 3 weeks. 1/31: Patient had another fall in the hospital, Repeat Ct head showed stable hematoma.  Acute metabolic encephalopathy that could be due to [facility]: Patient remains intermittently confused since arrival. Some slowed speech/delayed response UA: unremarkable, vitals are stable. Most likely could be due to subarachnoid hemorrhage. No source of infection found.  Labs unremarkable. Nursing/PT concern on 1/27 -> slurred speech and delayed responses -  Neuro input appreciated- repeat CT head unchanged, EEG normal.  Seizure disorder: Chronic and stable. Continue Keppra 500 mg twice a day. Continue seizure precautions. EEG normal  History of CVA with left-sided weakness Continuemetoprololand statins. Hold Plavix due to subarachnoid bleed.   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 Sliding scale coverage with insulin Dialysis daysare M/W/F HD per nephro. No further hemodialysis.  Depression Continue trazodone and Lexapro.  Hypertension -on metoprolol Lisinopril was added on 1/28 for better blood pressure control. 2/1: Hydralazine 25 mg three times daily added   Hypothyroidism Continue Synthroid  Hypomagnesemia Supplement magnesium  Frequent falls Patient's wife is concerned about frequent falls at home with increased  risk for  fractures. 1/30: Patient fell early morning,  found alongside the bed.  Denies any head trauma,  CT head stat unchanged. PT, OT -> SNF  Chronic sinusitis Patient had a CT scan of the head and cervical spine which showed chronic sinusitis from left OMUobstruction with complicated disease at the left maxillary sinus with chronic defect in the posterior wall. Discussed with ENT Dr. Richardson Landry who recommended empiric antibiotic coverage with Augmentin and to have patient follow-up with Dr.Juengelas an outpatient to discuss surgical management. Upon discharge patient will need appointment to follow-up with Dr. Kathyrn Sheriff   Palliative care input appreciated -palliative care consult appreciated. Goals of care discussion completed.  Care transition to full comfort care only.   DVT prophylaxis: SCDs Code Status: DNR Family Communication:  Spoke with wife on phone. Disposition Plan:   Status is: Inpatient  Remains inpatient appropriate because:Inpatient level of care appropriate due to severity of illness   Dispo: The patient is from: Home              Anticipated d/c is to:  Hospice Home              Anticipated d/c date is: 1-2 days.              Patient currently is not medically stable to d/c.   Difficult to place patient No    Consultants:   Cardiology  Neurosurgery  Neurology  Procedures: None Antimicrobials:   Anti-infectives (From admission, onward)   Start     Dose/Rate Route Frequency Ordered Stop   03/21/20 1800  amoxicillin-clavulanate (AUGMENTIN) 500-125 MG per tablet 500 mg  Status:  Discontinued        1 tablet Oral Every 24 hours 03/20/20 1416 03/30/20 1339   03/20/20 1300  amoxicillin-clavulanate (AUGMENTIN) 875-125 MG per tablet 1 tablet  Status:  Discontinued        1 tablet Oral Every 24 hours 03/20/20 1213 03/20/20 1416      Subjective: Patient was seen and examined at bedside. Overnight events noted.   Patient seems at his baseline mental status.  He  remains intermittently confused since arrival. Patient has resolving hematoma around right eye.   Patient has fell several times, CT head stable subarachnoid hemorrhage. Palliative Care consulted,  goals of care discussion completed.  Care transition to full comfort care only.  Objective: Vitals:   03/30/20 1300 03/30/20 1315 03/30/20 1330 03/30/20 1345  BP: (!) 148/64 (!) 161/73 (!) 162/62 (!) 160/65  Pulse: 68 73 69 67  Resp: 14 13 15 18   Temp:      TempSrc:      SpO2:      Weight:      Height:        Intake/Output Summary (Last 24 hours) at 03/30/2020 1419 Last data filed at 03/30/2020 1345 Gross per 24 hour  Intake 600 ml  Output 500 ml  Net 100 ml   Filed Weights   03/23/20 0408 03/25/20 0551 03/26/20 0422  Weight: 80.8 kg 81.2 kg 80.7 kg    Examination:  General exam: Appears calm and comfortable, hematoma noted around right eye Respiratory system: Clear to auscultation. Respiratory effort normal. Cardiovascular system: S1 & S2 heard, RRR. No JVD, murmurs, rubs, gallops or clicks. No pedal edema. Gastrointestinal system: Abdomen is nondistended, soft and nontender. No organomegaly or masses felt. Normal bowel sounds heard. Central nervous system: Alert and oriented. No focal neurological deficits. Extremities: No edema, no cyanosis, no clubbing. Skin: No  rashes, lesions or ulcers Psychiatry: Not assessed    Data Reviewed: I have personally reviewed following labs and imaging studies  CBC: Recent Labs  Lab 03/25/20 0605 03/27/20 0439 03/28/20 0446 03/29/20 0511 03/30/20 0521  WBC 5.7 5.8 7.5 5.8 5.5  HGB 10.2* 10.3* 10.1* 10.4* 10.1*  HCT 30.8* 31.9* 31.7* 31.2* 31.6*  MCV 95.7 97.0 97.2 95.4 97.5  PLT 148* 142* 149* 134* 889*   Basic Metabolic Panel: Recent Labs  Lab 03/25/20 0605 03/27/20 0439 03/28/20 0446 03/29/20 0511 03/30/20 0521  NA 136 134* 132* 137 137  K 4.3 4.8 5.4* 4.6 5.1  CL 97* 96* 93* 95* 96*  CO2 26 25 24 28 26   GLUCOSE 125*  124* 170* 98 115*  BUN 40* 41* 50* 27* 38*  CREATININE 7.22* 7.30* 8.40* 5.46* 6.85*  CALCIUM 8.2* 8.6* 8.8* 8.6* 8.5*  MG  --  1.5* 1.9 1.8  --   PHOS  --  3.7 4.4 3.0  --    GFR: Estimated Creatinine Clearance: 10.9 mL/min (A) (by C-G formula based on SCr of 6.85 mg/dL (H)). Liver Function Tests: No results for input(s): AST, ALT, ALKPHOS, BILITOT, PROT, ALBUMIN in the last 168 hours. No results for input(s): LIPASE, AMYLASE in the last 168 hours. No results for input(s): AMMONIA in the last 168 hours. Coagulation Profile: No results for input(s): INR, PROTIME in the last 168 hours. Cardiac Enzymes: No results for input(s): CKTOTAL, CKMB, CKMBINDEX, TROPONINI in the last 168 hours. BNP (last 3 results) No results for input(s): PROBNP in the last 8760 hours. HbA1C: No results for input(s): HGBA1C in the last 72 hours. CBG: Recent Labs  Lab 03/29/20 0809 03/29/20 1203 03/29/20 1650 03/29/20 2155 03/30/20 0747  GLUCAP 85 139* 142* 179* 135*   Lipid Profile: No results for input(s): CHOL, HDL, LDLCALC, TRIG, CHOLHDL, LDLDIRECT in the last 72 hours. Thyroid Function Tests: No results for input(s): TSH, T4TOTAL, FREET4, T3FREE, THYROIDAB in the last 72 hours. Anemia Panel: No results for input(s): VITAMINB12, FOLATE, FERRITIN, TIBC, IRON, RETICCTPCT in the last 72 hours. Sepsis Labs: No results for input(s): PROCALCITON, LATICACIDVEN in the last 168 hours.  No results found for this or any previous visit (from the past 240 hour(s)).  Radiology Studies: No results found. Scheduled Meds: . levETIRAcetam  500 mg Oral BID  . lisinopril  5 mg Oral Daily  . sodium chloride flush  3 mL Intravenous Q12H   Continuous Infusions: . sodium chloride       LOS: 8 days    Time spent: 25 mins    Shawna Clamp, MD [facility]   If 7PM-7AM, please contact night-coverage

## 2020-03-30 NOTE — Progress Notes (Signed)
PT Cancellation Note  Patient Details Name: Kyle [name] MRN: [id number] DOB: Jan 15, 1951   Cancelled Treatment:    Reason Eval/Treat Not Completed: Other (comment).  Pt currently off unit for dialysis.  Will re-attempt PT treatment session at a later date/time as able.  Leitha Bleak, PT 03/30/20, 11:25 AM

## 2020-03-30 NOTE — Progress Notes (Addendum)
Palliative: Conference with attending and bedside nursing staff related to patient condition and needs.  He is currently off the floor in hemodialysis.  Call to wife, Kyle [name].  We talk in detail about Kyle [name]'s current state, his recurrent falls, his frailty.  Kyle [name] shares that Kyle [name] was initially started on peritoneal dialysis, but after a stroke, he was transitioned to hemodialysis.  She talks about his weight loss, the changes he is experienced over the last year.  Kyle [name] shares that she came to visit on Sunday, but could not wake Kyle [name].  She is shares that he was exposing himself, at times attempting to get out of bed.  We talked about what we can and cannot change, meaningful improvements, and dignity.   We talked about comfort measures, let nature take its course.  We talked about hospice care in a residential hospice.  Kyle [name] shares that she wants to respect Kyle [name], and make the right choices for him.  I shared that the medical team and is in agreement that hospice care would be appropriate.  We talked about on burdening Kyle [name] from treatments that are changing what is happening, or are painful.  No further hemodialysis.  We talked about liberalizing the use of medicines for comfort.  Kyle [name] agrees.  We talked about comfort measures starting at the hospital, then transitioning to a residential hospice when a bed is available.  Kyle [name] agrees.  Choice of provider offered, Kyle [name] chooses [facility] in [place], sharing that her parents passed there. We talk about prognosis's and expected progression.   Conference with attending, bedside nursing staff, transition of care team related to patient condition, needs, goals of care.  Plan:    Full COMFORT care.  End-of-life order set implemented.  NO further HD.  Prognosis:   5-10 days expected.    Requesting comfort and dignity at end-of-life, residential hospice with [facility].  49 minutes Kyle Axe, NP Palliative Medicine Team  Team Phone [phone] Greater than 50% of this time was spent counseling and coordinating care related to the above assessment and plan.

## 2020-03-30 NOTE — Progress Notes (Signed)
PT Cancellation Note  Patient Details Name: Avonte Sensabaugh MRN: [id number] DOB: 01/07/1951   Cancelled Treatment:    Reason Eval/Treat Not Completed: Other (comment).  Chart reviewed.  Pt transitioned to full comfort care.  PT order cancelled by Palliative Care.  Leitha Bleak, PT 03/30/20, 5:06 PM

## 2020-03-31 DIAGNOSIS — R55 Syncope and collapse: Secondary | ICD-10-CM | POA: Diagnosis not present

## 2020-03-31 MED ORDER — GLYCOPYRROLATE 1 MG PO TABS: 1.0000 mg | ORAL_TABLET | ORAL | 0 refills | Status: AC | PRN

## 2020-03-31 NOTE — Progress Notes (Signed)
[facility]) Hospital Liaison RN note:  [facility] does have a room to offer today. Spoke with spouse, [name] over the phone to update. [name] will sign consents at the [facility] this morning at 10:30. Transportation has been arranged for 12:15. I will send the discharge summary to [facility] once available. Hospital care team is aware of above information.  Thank you for the opportunity to participate in this patient's care.  Zandra Abts, RN [facility] Liaison [phone]

## 2020-03-31 NOTE — Discharge Instructions (Signed)
Patient is being discharged to hospice home.  Care transitioned to full comfort care.

## 2020-03-31 NOTE — Progress Notes (Signed)
Palliative: Call from hospice representative relating to patient eminent disposition to residential hospice.  There is discussion from attending related to disposition.  Call to attending to discuss family's decisions/goals of care.  Call to wife, Kyle [name].  Support given.  She is at residential hospice signing paperwork for admission.  Plan: Comfort and dignity at end-of-life, let nature take its course at residential hospice [place] Prognosis: 5 to 10 days or less expected as [name] is HD dependent and no longer having hemodialysis.  No charge Quinn Axe, NP Palliative medicine team Team phone [phone] Greater than 50% of this time was spent counseling and coordinating care related to the above assessment and plan.

## 2020-03-31 NOTE — Discharge Summary (Addendum)
Physician Discharge Summary  Kyle [name] EXN:[id number] DOB: 08/24/1950 DOA: 03/20/2020  PCP: Idelle Crouch, MD  Admit date: 03/20/2020 .  Discharge date: 03/31/2020.  Admitted From:  Home.   Disposition:  Hospice home.  Recommendations for Outpatient Follow-up:  1. Follow up with PCP in 1-2 weeks 2. Patient is being discharged to hospice home. 3. Care transitioned to Full comfort care .  Home Health: None. Equipment/Devices: None  Discharge Condition: Fair CODE STATUS:DNR Diet recommendation: Heart Healthy  Brief Summary: This 70 years old male with PMH significant for ESRD on dialysis M/W/F, CVA, chronic diastolic heart failure, COPD, PAD, OSA, history of GI bleed, insulin-dependent diabetes, hyperlipidemia presents to the ER for the evaluation of fall following syncopal episode. Patient has developed huge hematoma over his right eye after he was found on the floor. Patient does not remember how did this happen. CT head and cervical spine shows small volume subarachnoid hemorrhage and possible subdural hemorrhage, along the superficial temporal lobe. No significant mass-effect. Of note patient was recently discharged from hospital about 3 days ago where he was evaluated for GI bleed.   Hospital Course: Patient was admitted for possible syncopal episode, Neurosurgery, Cardiology and Nephrology consults were called. Patient underwent hemodialysis on 1/24. Hemodialysis was continued as per schedule on  M/W/F. renal status is stable. Cardiology has recommended Linq device for detection of heart arrhythmias causing syncope.  Patient's family declined Linq device since patient has recurrent falls at home.  Neurosurgery recommended no intervention. Repeat CT head scan shows stable subarachnoid hemorrhage.  Patient has fallen 3 times in the hospital,  3 time stat CT head was completed,  showed stable subarachnoid hemorrhage.  Patient remained disoriented / Altered mentations  throughout hospital course with intermittent periods of orientation.  Palliative care consulted to discuss goals of care discussion.  Patient's wife has made decision.  She decided no further hemodialysis since this is not going to improve quality of life. Patient is at end stage disease. She wants nature takes its course. Wife wants to him to be comfortable. Care transitioned to full comfort care.  Patient is being discharged to hospice home.  He was managed for below problems.  Discharge Diagnoses:  Principal Problem:   Syncope and collapse Active Problems:   Chronic obstructive pulmonary disease (HCC)   Seizure disorder (HCC)   Hypothyroidism   ESRD (end stage renal disease) on dialysis (HCC)   Falls frequently   Depression   Chronic diastolic CHF (congestive heart failure) (HCC)   SAH (subarachnoid hemorrhage) (HCC)   Chronic sinusitis   Fall  Syncope and collapse: Patient was found on the floor by his wife following a fall. Patient does not remember the events and wife did not see any jerking motions suggestive of a seizure. He was not incontinent of urine or feces. 2D echo: LVEF 35 to 40%. Left ventricle has decreased function. There is global hypokinesis. Cardiology ->Patient does not have any telemetry changes or rhythm disturbance consistent with episode of syncope.  Recommended Linq device but wife not agreeable per cardio note. Wife declined to have Linq device. Cardio: No further workup needed.   Subarachnoid bleed,traumatic Traumatic andfollowing a fall Neurosurgery seen and recommends hold Plavix and anticoagulants. Repeat CT head shows stable subarachnoid hemorrhage.  Hold Plavix for 7 days after fall , hold DVT prophylaxis for 48 hours. Follow-up in neurosurgery clinic in 2 to 3 weeks. 1/31: Patient had another fall in the hospital, Repeat Ct head showed stable hematoma.  Acute metabolic encephalopathy that could be due to [facility]: Patient remains  intermittently confused since arrival. Some slowed speech/delayed response UA: unremarkable, vitals are stable. Most likely could be due to subarachnoid hemorrhage. No source of infection found. Labs unremarkable. Nursing/PTconcern on 1/27 ->slurred speech and delayed responses -  Neuro input appreciated- repeat CT head unchanged, EEGnormal.  Seizure disorder: Chronic and stable. Continue Keppra 500 mg twice a day. Continue seizure precautions. EEGnormal.  History of CVA with left-sided weakness Continuedmetoprololand statins. Hold Plavix due to subarachnoid bleed.   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 Sliding scale coverage with insulin Dialysis daysare M/W/F HD per nephro. No further hemodialysis.  Depression Continued trazodone and Lexapro.  Hypertension-on metoprolol Lisinopril was added on 1/28 for better blood pressure control. 2/1: Hydralazine 25 mg three times daily added   Hypothyroidism Continue Synthroid  Hypomagnesemia Supplement magnesium  Frequent falls Patient's wife is concerned about frequent falls at home with increased risk for fractures. 1/30: Patient fell early morning,  found alongside the bed.  Denies any head trauma,  CT head stat unchanged. PT, OT -> SNF    Palliative care input appreciated -palliative care consult appreciated. Goals of care discussion completed.  Care transitioned to full comfort care only.   Discharge Instructions  Discharge Instructions    Diet - low sodium heart healthy   Complete by: As directed    Diet Carb Modified   Complete by: As directed    Discharge instructions   Complete by: As directed    Patient is being discharged to Hospice home.   Increase activity slowly   Complete by: As directed      Allergies as of 03/31/2020      Reactions   Vimpat [lacosamide] Other (See Comments)   Reports causes  seizures      Medication List    STOP taking these medications   atorvastatin 10 MG tablet Commonly known as: LIPITOR   calcium acetate 667 MG capsule Commonly known as: PHOSLO   clopidogrel 75 MG tablet Commonly known as: PLAVIX   escitalopram 10 MG tablet Commonly known as: LEXAPRO   gabapentin 100 MG capsule Commonly known as: NEURONTIN   levothyroxine 200 MCG tablet Commonly known as: SYNTHROID   lidocaine-prilocaine cream Commonly known as: EMLA   metoprolol succinate 50 MG 24 hr tablet Commonly known as: TOPROL-XL   NEPHRO-VITE PO   Pancrelipase (Lip-Prot-Amyl) 24000-76000 units Cpep   pantoprazole 40 MG tablet Commonly known as: PROTONIX     TAKE these medications   glycopyrrolate 1 MG tablet Commonly known as: ROBINUL Take 1 tablet (1 mg total) by mouth every 4 (four) hours as needed (excessive secretions).   levETIRAcetam 500 MG tablet Commonly known as: KEPPRA Take 500 mg by mouth 2 (two) times daily.       Follow-up Information    Idelle Crouch, MD Follow up in 1 week(s).   Specialty: Internal Medicine Contact information: [place] [address] [phone]              Allergies  Allergen Reactions  . Vimpat [Lacosamide] Other (See Comments)    Reports causes seizures    Consultations:  Nephrology  Neurosurgery  Palliative care   Procedures/Studies: EEG  Result Date: 03/24/2020 Lora Havens, MD     03/24/2020  6:56 PM Patient Name: Cannen Dupras MRN: [id number] Epilepsy Attending: Lora Havens Referring Physician/Provider: Dr Amie [place] Date:  03/24/2020 Duration: 20.55 mins Patient history: 70 year old man with history of strokes in seizures in the past admitted with a traumatic subarachnoid as well as traumatic hemorrhagic left temporal brain contusion with concern of mildly worse slurred speech. EEG to evaluate for seizure Level of alertness: Awake AEDs during EEG  study: LEV, GBP Technical aspects: This EEG study was done with scalp electrodes positioned according to the 10-20 International system of electrode placement. Electrical activity was acquired at a sampling rate of 500Hz  and reviewed with a high frequency filter of 70Hz  and a low frequency filter of 1Hz . EEG data were recorded continuously and digitally stored. Description: The posterior dominant rhythm consists of 8 Hz activity of moderate voltage (25-35 uV) seen predominantly in posterior head regions, symmetric and reactive to eye opening and eye closing. Physiologic photic driving was not seen during photic stimulation.  Hyperventilation was not performed.   IMPRESSION: This study is within normal limits. No seizures or epileptiform discharges were seen throughout the recording. Lora Havens   CT Angio Head W or Wo Contrast  Result Date: 03/20/2020 CLINICAL DATA:  Head trauma with intracranial arterial injury suspected EXAM: CT ANGIOGRAPHY HEAD AND NECK TECHNIQUE: Multidetector CT imaging of the head and neck was performed using the standard protocol during bolus administration of intravenous contrast. Multiplanar CT image reconstructions and MIPs were obtained to evaluate the vascular anatomy. Carotid stenosis measurements (when applicable) are obtained utilizing NASCET criteria, using the distal internal carotid diameter as the denominator. CONTRAST:  81mL OMNIPAQUE IOHEXOL 350 MG/ML SOLN COMPARISON:  Head CT from earlier today. FINDINGS: CTA NECK FINDINGS Aortic arch: Atheromatous calcification.  No aneurysm or dissection Right carotid system: Atheromatous wall thickening with calcification at the bifurcation. No stenosis or ulceration. Left carotid system: Atheromatous wall thickening of the common carotid. No flow limiting stenosis or ulceration. Vertebral arteries: No proximal subclavian stenosis. There are codominant vertebral arteries that are smooth and widely patent to the dura. Skeleton: No  acute finding. Other neck: Left-sided sinusitis with OMU obstructive pattern and cortical breakthrough at the posterior wall left maxillary sinus, described on preceding head CT. Upper chest: Moderate, layering right pleural effusion. Generous mediastinal lymph nodes, likely reactive. Review of the MIP images confirms the above findings CTA HEAD FINDINGS Anterior circulation: Heavily calcified carotid siphons with up to 50% stenosis on the right. No branch occlusion, beading, aneurysm, or evidence of vascular malformation. Broad appearance at the anterior communicating artery without discrete aneurysm. Essentially azygos A2 segment. Posterior circulation: Codominant vertebral arteries. Atheromatous plaque asymmetric to the left V4 segment. The vertebral and basilar arteries are smooth and widely patent. Fetal type left PCA. Negative for aneurysm or vascular malformation Venous sinuses: Unremarkable Anatomic variants: As above Review of the MIP images confirms the above findings IMPRESSION: 1. No arterial cause/injury seen underlying the traumatic pattern subarachnoid hemorrhage. 2. Cervical and intracranial carotid atherosclerosis with up to 50% stenosis at the right cavernous ICA. 3. Layering right pleural effusion. 4. Complicated left sinusitis as previously described. Electronically Signed   By: Monte Fantasia M.D.   On: 03/20/2020 10:13   DG Chest 2 View  Result Date: 03/20/2020 CLINICAL DATA:  Assess for pleural effusion. EXAM: CHEST - 2 VIEW COMPARISON:  March 15, 2020 FINDINGS: The mediastinal contour and cardiac silhouette are stable. Heart size is enlarged. Mild increased pulmonary interstitium is identified bilaterally unchanged. There is minimal right pleural effusion. There is no left pleural effusion. The bony structures are stable. IMPRESSION: Minimal right pleural effusion. Electronically  Signed   By: Abelardo Diesel M.D.   On: 03/20/2020 11:16   CT HEAD WO CONTRAST  Result Date:  03/27/2020 CLINICAL DATA:  Head trauma. Intracranial hemorrhage. Fall 7 days prior EXAM: CT HEAD WITHOUT CONTRAST TECHNIQUE: Contiguous axial images were obtained from the base of the skull through the vertex without intravenous contrast. COMPARISON:  Head CT 03/24/2020 is FINDINGS: Brain: Parenchymal hemorrhage in the LEFT temporal operculum again noted. Hemorrhage measures approximately 2.6 by 2.1 cm unchanged from 2.6 x 2.0 cm. Visually hemorrhage appears unchanged. There is associated edema more medially within the LEFT upper lobe. No intraventricular hemorrhage. No midline shift. Basal cisterns are patent. Shallow subdural hematoma noted posteriorly over the LEFT occipital lobe measuring 2 mm (image 14/3) unchanged. Vascular: No hyperdense vessel or unexpected calcification. Skull: No associated skull fracture with the LEFT intraparenchymal hemorrhage. No skull base fracture. Sinuses/Orbits: Complete opacification of the LEFT maxillary sinus with chronic bowing of the medial wall. Other: None IMPRESSION: 1. No interval change in a parenchymal hemorrhage within the LEFT temporal operculum. 2. Shallow subdural hematoma over the LEFT cerebral convexity unchanged. 3. No midline shift or mass effect. Basilar cisterns are patent. 4. Chronic opacification of the LEFT maxillary sinus. Electronically Signed   By: Suzy Bouchard M.D.   On: 03/27/2020 07:53   CT HEAD WO CONTRAST  Result Date: 03/24/2020 CLINICAL DATA:  Traumatic brain injury (TBI), new cognitive or neuro deficit EXAM: CT HEAD WITHOUT CONTRAST TECHNIQUE: Contiguous axial images were obtained from the base of the skull through the vertex without intravenous contrast. COMPARISON:  03/23/2020 and prior. FINDINGS: Brain: Left cerebral convexity subdural hematoma measuring up to 3 mm is grossly unchanged. Left temporal intraparenchymal hemorrhage measuring 2.6 x 2.0 cm is similar to prior exam (2:11). Small volume left cerebral convexity subarachnoid  hemorrhage is unchanged. No intraventricular extension. No new focal hypodensity. No mass lesion. No midline shift, ventriculomegaly. Mild cerebral atrophy with ex vacuo dilatation. Chronic microvascular ischemic changes. Vascular: No hyperdense vessel or unexpected calcification. Skull: Negative for fracture or focal lesion. Sinuses/Orbits: Normal orbits. Opacification of the left frontal, ethmoid and maxillary sinus with chronic sequela is unchanged. Other: None. IMPRESSION: Left temporal intraparenchymal hemorrhage measuring 2.6 cm is unchanged. Small left cerebral convexity subdural hematoma with subarachnoid hemorrhage, similar prior exam. Electronically Signed   By: Primitivo Gauze M.D.   On: 03/24/2020 17:14   CT HEAD WO CONTRAST  Result Date: 03/23/2020 CLINICAL DATA:  70 year old male with head trauma. Follow-up intracranial hemorrhage. EXAM: CT HEAD WITHOUT CONTRAST TECHNIQUE: Contiguous axial images were obtained from the base of the skull through the vertex without intravenous contrast. COMPARISON:  Head CT dated 03/20/2020. FINDINGS: Brain: Left-sided subdural hemorrhage measuring approximately 4 mm in thickness over the left occipital lobe and 2 mm over the left temporal lobe and without significant interval change. Left temporal intraparenchymal hemorrhage with small subarachnoid components not significantly change. There has been however interval development of mild edema surrounding the left temporal intraparenchymal bleed. There is associated mild mass effect. No significant midline shift. No new bleed. There is mild age-related atrophy and moderate chronic microvascular ischemic changes. Small old lacunar infarct in the pons. Vascular: No hyperdense vessel or unexpected calcification. Skull: No acute calvarial pathology. Sinuses/Orbits: There is complete opacification of the left frontal, ethmoid, and maxillary sinus with remodeling of the medial and lateral walls of the left maxillary  sinus as seen previously. No air-fluid level. The mastoid air cells are clear. Other: None IMPRESSION: 1. No  significant interval change in the left sided intracranial bleed compared to prior CT. No new hemorrhage. Continued follow-up recommended. 2. No significant midline shift. 3. Age-related atrophy and chronic microvascular ischemic changes. 4. Chronic left paranasal sinus disease. Electronically Signed   By: Anner Crete M.D.   On: 03/23/2020 16:07   CT HEAD WO CONTRAST  Result Date: 03/20/2020 CLINICAL DATA:  Initial evaluation for acute mental status change. Worsening. EXAM: CT HEAD WITHOUT CONTRAST TECHNIQUE: Contiguous axial images were obtained from the base of the skull through the vertex without intravenous contrast. COMPARISON:  Prior head CT from earlier the same day. FINDINGS: Brain: Posttraumatic subarachnoid hemorrhage involving the peripheral left frontotemporal convexity again seen, not significantly changed in overall appearance and volume from previous. Suspected superimposed hemorrhagic contusion also similar. Overlying subdural hematoma also not significantly changed, measuring up to 5-6 mm in maximal thickness. Trace 2 mm left-to-right shift, unchanged. No hydrocephalus or ventricular trapping. Basilar cisterns remain patent. No new intracranial hemorrhage. No acute large vessel territory infarct. Underlying atrophy with chronic small vessel ischemic disease again noted. No appreciable mass lesion. Vascular: No asymmetric hyperdense vessel. Calcified atherosclerosis present at the skull base. Skull: Evolving right frontoparietal scalp contusion. Calvarium intact. Sinuses/Orbits: Globes and orbital soft tissues demonstrate no acute finding. Chronic left frontoethmoidal and maxillary sinusitis again noted. Mastoids remain clear. Other: None. IMPRESSION: 1. No significant interval change in left frontotemporal convexity subarachnoid hemorrhage with probable superimposed hemorrhagic  contusion. Overlying extra-axial hemorrhage also not significantly changed, likely subdural. Trace 2 mm left-to-right shift, unchanged. No hydrocephalus or ventricular trapping. 2. No other new acute intracranial abnormality. 3. Evolving right frontoparietal scalp contusion. No calvarial fracture. 4. Chronic left frontoethmoidal and maxillary sinusitis. Electronically Signed   By: Jeannine Boga M.D.   On: 03/20/2020 20:20   CT Head Wo Contrast  Result Date: 03/20/2020 CLINICAL DATA:  Follow-up of left temporal lobe subarachnoid and possible subdural hemorrhage. EXAM: CT HEAD WITHOUT CONTRAST TECHNIQUE: Contiguous axial images were obtained from the base of the skull through the vertex without intravenous contrast. COMPARISON:  Earlier today at 7:22 a.m. FINDINGS: 2:32 p.m. Brain: Cerebral atrophy. Mild increase in small volume subarachnoid hemorrhage adjacent the left temporal and frontal lobes, including on 23/2. Small volume subdural hemorrhage including at 4 mm on 14/2 is increased, especially superiorly. Example adjacent the left frontal lobe near the vertex on coronal image 24/4 at 3 mm. Adjacent the left parietal lobe at up to 5 mm on coronal image 43, new. No significant mass effect or midline shift. No complicating infarct. No hydrocephalus or intraventricular hemorrhage. Mild low density in the periventricular white matter likely related to small vessel disease. Vascular: Intracranial atherosclerosis. Skull: Posterior scalp soft tissue swelling is greater right than left and relatively similar. No skull fracture. Sinuses/Orbits: Normal imaged portions of the orbits and globes. Left sided sinus opacification was detailed on CT of earlier today. Other: None. IMPRESSION: 1. Mild increase in subarachnoid hemorrhage adjacent the left temporal and frontal lobes. 2. Small volume extra-axial hematoma, favoring subdural, mildly increased especially superiorly. 3.  Cerebral atrophy and small vessel  ischemic change. Electronically Signed   By: Abigail Miyamoto M.D.   On: 03/20/2020 15:02   CT Head Wo Contrast  Result Date: 03/20/2020 CLINICAL DATA:  Fall with facial trauma EXAM: CT HEAD WITHOUT CONTRAST CT MAXILLOFACIAL WITHOUT CONTRAST CT CERVICAL SPINE WITHOUT CONTRAST TECHNIQUE: Multidetector CT imaging of the head, cervical spine, and maxillofacial structures were performed using the standard protocol without intravenous contrast.  Multiplanar CT image reconstructions of the cervical spine and maxillofacial structures were also generated. COMPARISON:  01/25/2020 FINDINGS: CT HEAD FINDINGS Brain: High-density/acute hemorrhage in the subarachnoid and possibly subdural spaces over the lateral left temporal lobe, in total measuring up to 3 mm in thickness. No associated mass effect. No visible parenchymal hemorrhage or swelling. Brain atrophy and chronic small vessel ischemia. No evidence of acute infarct. 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 Vascular: No hyperdense vessel or unexpected calcification. Skull: Posterior scalp swelling without calvarial fracture. CT MAXILLOFACIAL FINDINGS Osseous: No acute fracture or mandibular dislocation. Orbits: Swelling superficial to the right orbit. No visible globe or postseptal injury. Bilateral cataract resection. 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 Sinuses: Chronic sinusitis with opacified left maxillary, anterior ethmoid, and frontal sinuses. Left maxillary wall is sclerotic and thickened with a broad dehiscence along the posterolateral wall that is traversed by partially calcified material. No sinus mass was seen on a April 07, 2009 brain MRI, but dehiscence had not been noted at that time either. Soft tissues: Hazy retro antral fat on the left, stable where covered on prior head CT 01/25/2020. CT CERVICAL SPINE FINDINGS Alignment: No traumatic malalignment. Skull base and vertebrae: Negative for  cervical spine fracture. T1 and T2 superior endplate fractures with trabecular impaction, nonacute in seen on a 2021 chest CT. Soft tissues and spinal canal: No prevertebral fluid or swelling. No visible canal hematoma. Mildly enlarged lymph nodes in the upper right jugular chain, posterior to the internal jugular vein, unchanged from November 2021 and thus presumably reactive Disc levels: Multilevel degenerative facet spurring asymmetric to the right. Ordinary mid and lower cervical disc degeneration. Upper chest: Layering right pleural effusion, small where covered. Critical Value/emergent results were called by telephone at the time of interpretation on 03/20/2020 at 8:00 am to provider Lavonia Drafts , who verbally acknowledged these results. IMPRESSION: 1. Small volume subarachnoid and possibly subdural hemorrhage along the superficial left temporal lobe. No significant mass effect. 2. Scalp and facial swelling without acute fracture. 3. Negative for cervical spine fracture. 4. 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 ENT referral is recommended if appropriate for comorbidities. Electronically Signed   By: Monte Fantasia M.D.   On: 03/20/2020 08:07   CT Angio Neck W and/or Wo Contrast  Result Date: 03/20/2020 CLINICAL DATA:  Head trauma with intracranial arterial injury suspected EXAM: CT ANGIOGRAPHY HEAD AND NECK TECHNIQUE: Multidetector CT imaging of the head and neck was performed using the standard protocol during bolus administration of intravenous contrast. Multiplanar CT image reconstructions and MIPs were obtained to evaluate the vascular anatomy. Carotid stenosis measurements (when applicable) are obtained utilizing NASCET criteria, using the distal internal carotid diameter as the denominator. CONTRAST:  93mL OMNIPAQUE IOHEXOL  350 MG/ML SOLN COMPARISON:  Head CT from earlier today. FINDINGS: CTA NECK FINDINGS Aortic arch: Atheromatous calcification.  No aneurysm or dissection Right carotid system: Atheromatous wall thickening with calcification at the bifurcation. No stenosis or ulceration. Left carotid system: Atheromatous wall thickening of the common carotid. No flow limiting stenosis or ulceration. Vertebral arteries: No proximal subclavian stenosis. There are codominant vertebral arteries that are smooth and widely patent to the dura. Skeleton: No acute finding. Other neck: Left-sided sinusitis with OMU obstructive pattern and cortical breakthrough at the posterior wall left maxillary  sinus, described on preceding head CT. Upper chest: Moderate, layering right pleural effusion. Generous mediastinal lymph nodes, likely reactive. Review of the MIP images confirms the above findings CTA HEAD FINDINGS Anterior circulation: Heavily calcified carotid siphons with up to 50% stenosis on the right. No branch occlusion, beading, aneurysm, or evidence of vascular malformation. Broad appearance at the anterior communicating artery without discrete aneurysm. Essentially azygos A2 segment. Posterior circulation: Codominant vertebral arteries. Atheromatous plaque asymmetric to the left V4 segment. The vertebral and basilar arteries are smooth and widely patent. Fetal type left PCA. Negative for aneurysm or vascular malformation Venous sinuses: Unremarkable Anatomic variants: As above Review of the MIP images confirms the above findings IMPRESSION: 1. No arterial cause/injury seen underlying the traumatic pattern subarachnoid hemorrhage. 2. Cervical and intracranial carotid atherosclerosis with up to 50% stenosis at the right cavernous ICA. 3. Layering right pleural effusion. 4. Complicated left sinusitis as previously described. Electronically Signed   By: Monte Fantasia M.D.   On: 03/20/2020 10:13   CT Cervical Spine Wo Contrast  Result Date:  03/20/2020 CLINICAL DATA:  Fall with facial trauma EXAM: CT HEAD WITHOUT CONTRAST CT MAXILLOFACIAL WITHOUT CONTRAST CT CERVICAL SPINE WITHOUT CONTRAST TECHNIQUE: Multidetector CT imaging of the head, cervical spine, and maxillofacial structures were performed using the standard protocol without intravenous contrast. Multiplanar CT image reconstructions of the cervical spine and maxillofacial structures were also generated. COMPARISON:  01/25/2020 FINDINGS: CT HEAD FINDINGS Brain: High-density/acute hemorrhage in the subarachnoid and possibly subdural spaces over the lateral left temporal lobe, in total measuring up to 3 mm in thickness. No associated mass effect. No visible parenchymal hemorrhage or swelling. Brain atrophy and chronic small vessel ischemia. No evidence of acute infarct. 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 Vascular: No hyperdense vessel or unexpected calcification. Skull: Posterior scalp swelling without calvarial fracture. CT MAXILLOFACIAL FINDINGS Osseous: No acute fracture or mandibular dislocation. Orbits: Swelling superficial to the right orbit. No visible globe or postseptal injury. Bilateral cataract resection. 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 Sinuses: Chronic sinusitis with opacified left maxillary, anterior ethmoid, and frontal sinuses. Left maxillary wall is sclerotic and thickened with a broad dehiscence along the posterolateral wall that is traversed by partially calcified material. No sinus mass was seen on a April 07, 2009 brain MRI, but dehiscence had not been noted at that time either. Soft tissues: Hazy retro antral fat on the left, stable where covered on prior head CT 01/25/2020. CT CERVICAL SPINE FINDINGS Alignment: No traumatic malalignment. Skull base and vertebrae: Negative for cervical spine fracture. T1 and T2 superior endplate fractures with trabecular impaction, nonacute in seen on a 2021 chest CT.  Soft tissues and spinal canal: No prevertebral fluid or swelling. No visible canal hematoma. Mildly enlarged lymph nodes in the upper right jugular chain, posterior to the internal jugular vein, unchanged from November 2021 and thus presumably reactive Disc levels: Multilevel degenerative facet spurring asymmetric to the right. Ordinary mid and lower cervical disc degeneration. Upper chest: Layering right pleural effusion, small where covered. Critical Value/emergent results were called by telephone at the time of interpretation on 03/20/2020 at 8:00 am to provider Lavonia Drafts , who verbally acknowledged these results. IMPRESSION: 1. Small volume subarachnoid and possibly subdural hemorrhage along the superficial left temporal lobe. No significant mass effect. 2. Scalp and facial swelling without acute fracture. 3. Negative for cervical spine fracture. 4. 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 Complicated disease at the left maxillary sinus  with chronic defect in the posterior wall that does not appear progressed from November 2021 but is new from January 2021. ENT referral is recommended if appropriate for comorbidities. Electronically Signed   By: Monte Fantasia M.D.   On: 03/20/2020 08:07   DG Chest Portable 1 View  Result Date: 03/15/2020 CLINICAL DATA:  Altered mental status. EXAM: PORTABLE CHEST 1 VIEW COMPARISON:  January 25, 2020 FINDINGS: Chronic appearing increased lung markings, without evidence of acute infiltrate, pleural effusion or pneumothorax. The cardiac silhouette is moderately enlarged. Is marked severity calcification of the aortic arch. Chronic right-sided rib fractures are seen. Multilevel degenerative changes are noted throughout the thoracic spine. IMPRESSION: Stable cardiomegaly with chronic appearing increased lung markings. Electronically Signed   By: Virgina Norfolk M.D.   On: 03/15/2020 20:56   ECHOCARDIOGRAM COMPLETE  Result Date: 03/22/2020    ECHOCARDIOGRAM REPORT    Patient Name:   LATAVIOUS BITTER Duma Date of Exam: 03/21/2020 Medical Rec #:  [id number]          Height:       72.0 in Accession #:    [id number]         Weight:       176.0 lb Date of Birth:  23-Feb-1951          BSA:          2.018 m Patient Age:    6 years           BP:           181/83 mmHg Patient Gender: M                  HR:           81 bpm. Exam Location:  [facility] Procedure: 2D Echo, Cardiac Doppler and Color Doppler Indications:     Syncope 780.2 / R55  History:         Patient has prior history of Echocardiogram examinations.                  Stroke; Risk Factors:Hypertension.  Sonographer:     Alyse Low Roar Referring Phys:  [place] Diagnosing Phys: Bartholome Bill MD IMPRESSIONS  1. Left ventricular ejection fraction, by estimation, is 35 to 40%. The left ventricle has moderately decreased function. The left ventricle demonstrates global hypokinesis. The left ventricular internal cavity size was mildly dilated. There is mild left ventricular hypertrophy. Left ventricular diastolic parameters were normal.  2. Right ventricular systolic function is mildly reduced. The right ventricular size is moderately enlarged.  3. Left atrial size was mild to moderately dilated.  4. Right atrial size was mildly dilated.  5. The mitral valve is grossly normal. Mild mitral valve regurgitation.  6. The aortic valve is grossly normal. Aortic valve regurgitation is not visualized. FINDINGS  Left Ventricle: Left ventricular ejection fraction, by estimation, is 35 to 40%. The left ventricle has moderately decreased function. The left ventricle demonstrates global hypokinesis. The left ventricular internal cavity size was mildly dilated. There is mild left ventricular hypertrophy. Left ventricular diastolic parameters were normal. Right Ventricle: The right ventricular size is moderately enlarged. No increase in right ventricular wall thickness. Right ventricular systolic function is mildly reduced. Left Atrium: Left  atrial size was mild to moderately dilated. Right Atrium: Right atrial size was mildly dilated. Pericardium: There is no evidence of pericardial effusion. Mitral Valve: The mitral valve is grossly normal. Mild mitral valve regurgitation. Tricuspid Valve: The tricuspid valve is grossly  normal. Tricuspid valve regurgitation is mild. Aortic Valve: The aortic valve is grossly normal. Aortic valve regurgitation is not visualized. Aortic valve peak gradient measures 4.6 mmHg. Pulmonic Valve: The pulmonic valve was not well visualized. Pulmonic valve regurgitation is trivial. Aorta: The aortic root is normal in size and structure. IAS/Shunts: No atrial level shunt detected by color flow Doppler.  LEFT VENTRICLE PLAX 2D LVIDd:         5.37 cm      Diastology LVIDs:         4.42 cm      LV e' medial:   4.35 cm/s LV PW:         1.19 cm      LV E/e' medial: 23.0 LV IVS:        1.14 cm LVOT diam:     1.90 cm LVOT Area:     2.84 cm  LV Volumes (MOD) LV vol d, MOD A2C: 151.0 ml LV vol d, MOD A4C: 132.0 ml LV vol s, MOD A2C: 97.5 ml LV vol s, MOD A4C: 81.6 ml LV SV MOD A2C:     53.5 ml LV SV MOD A4C:     132.0 ml LV SV MOD BP:      50.5 ml RIGHT VENTRICLE RV Mid diam:    4.75 cm RV S prime:     7.07 cm/s TAPSE (M-mode): 1.0 cm LEFT ATRIUM              Index       RIGHT ATRIUM           Index LA diam:        4.80 cm  2.38 cm/m  RA Area:     31.10 cm LA Vol (A2C):   116.0 ml 57.49 ml/m RA Volume:   114.00 ml 56.49 ml/m LA Vol (A4C):   107.0 ml 53.03 ml/m LA Biplane Vol: 112.0 ml 55.50 ml/m  AORTIC VALVE                PULMONIC VALVE AV Area (Vmax): 1.73 cm    RVOT Peak grad: 1 mmHg AV Vmax:        107.00 cm/s AV Peak Grad:   4.6 mmHg LVOT Vmax:      65.10 cm/s  AORTA Ao Root diam: 3.40 cm MITRAL VALVE                TRICUSPID VALVE MV Area (PHT): 4.29 cm     TR Peak grad:   37.2 mmHg MV Decel Time: 177 msec     TR Vmax:        305.00 cm/s MV E velocity: 100.00 cm/s                             SHUNTS                              Systemic Diam: 1.90 cm Bartholome Bill MD Electronically signed by Bartholome Bill MD Signature Date/Time: 03/22/2020/7:42:50 AM    Final    CT Maxillofacial Wo Contrast  Result Date: 03/20/2020 CLINICAL DATA:  Fall with facial trauma EXAM: CT HEAD WITHOUT CONTRAST CT MAXILLOFACIAL WITHOUT CONTRAST CT CERVICAL SPINE WITHOUT CONTRAST TECHNIQUE: Multidetector CT imaging of the head, cervical spine, and maxillofacial structures were performed using the standard protocol without intravenous contrast. Multiplanar CT image reconstructions of the cervical spine and maxillofacial  structures were also generated. COMPARISON:  01/25/2020 FINDINGS: CT HEAD FINDINGS Brain: High-density/acute hemorrhage in the subarachnoid and possibly subdural spaces over the lateral left temporal lobe, in total measuring up to 3 mm in thickness. No associated mass effect. No visible parenchymal hemorrhage or swelling. Brain atrophy and chronic small vessel ischemia. No evidence of acute infarct. 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 Vascular: No hyperdense vessel or unexpected calcification. Skull: Posterior scalp swelling without calvarial fracture. CT MAXILLOFACIAL FINDINGS Osseous: No acute fracture or mandibular dislocation. Orbits: Swelling superficial to the right orbit. No visible globe or postseptal injury. Bilateral cataract resection. 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 Sinuses: Chronic sinusitis with opacified left maxillary, anterior ethmoid, and frontal sinuses. Left maxillary wall is sclerotic and thickened with a broad dehiscence along the posterolateral wall that is traversed by partially calcified material. No sinus mass was seen on a April 07, 2009 brain MRI, but dehiscence had not been noted at that time either. Soft tissues: Hazy retro antral fat on the left, stable where covered on prior head CT 01/25/2020. CT CERVICAL SPINE FINDINGS Alignment: No  traumatic malalignment. Skull base and vertebrae: Negative for cervical spine fracture. T1 and T2 superior endplate fractures with trabecular impaction, nonacute in seen on a 2021 chest CT. Soft tissues and spinal canal: No prevertebral fluid or swelling. No visible canal hematoma. Mildly enlarged lymph nodes in the upper right jugular chain, posterior to the internal jugular vein, unchanged from November 2021 and thus presumably reactive Disc levels: Multilevel degenerative facet spurring asymmetric to the right. Ordinary mid and lower cervical disc degeneration. Upper chest: Layering right pleural effusion, small where covered. Critical Value/emergent results were called by telephone at the time of interpretation on 03/20/2020 at 8:00 am to provider Lavonia Drafts , who verbally acknowledged these results. IMPRESSION: 1. Small volume subarachnoid and possibly subdural hemorrhage along the superficial left temporal lobe. No significant mass effect. 2. Scalp and facial swelling without acute fracture. 3. Negative for cervical spine fracture. 4. 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 ENT referral is recommended if appropriate for comorbidities. Electronically Signed   By: Monte Fantasia M.D.   On: 03/20/2020 08:07    CT Head, Echo, EEG   Subjective: Patient was seen and examined at bedside.  Overnight events noted.  Patient seems alert and oriented x1 today.   Hemodialysis discontinued,  patient's care transitioned to full comfort care.  Patient is being discharged to hospice home today  Discharge Exam: Vitals:   03/30/20 1936 03/31/20 0745  BP: (!) 167/75 (!) 170/86  Pulse: 80 85  Resp: 20 16  Temp: 98 F (36.7 C) 97.7 F (36.5 C)  SpO2: 99% 97%   Vitals:   03/30/20 1345 03/30/20 1936 03/31/20 0458 03/31/20 0745  BP: (!)  160/65 (!) 167/75  (!) 170/86  Pulse: 67 80  85  Resp: 18 20  16   Temp:  98 F (36.7 C)  97.7 F (36.5 C)  TempSrc:  Oral  Oral  SpO2:  99%  97%  Weight:   79.4 kg   Height:        General: Pt is alert, awake, not in acute distress Cardiovascular: RRR, S1/S2 +, no rubs, no gallops Respiratory: CTA bilaterally, no wheezing, no rhonchi Abdominal: Soft, NT, ND, bowel sounds +  Extremities: no edema, no cyanosis    The results of significant diagnostics from this hospitalization (including imaging, microbiology, ancillary and laboratory) are listed below for reference.     Microbiology: No results found for this or any previous visit (from the past 240 hour(s)).   Labs: BNP (last 3 results) Recent Labs    01/25/20 0816  BNP 8,315.1*   Basic Metabolic Panel: Recent Labs  Lab 03/25/20 0605 03/27/20 0439 03/28/20 0446 03/29/20 0511 03/30/20 0521  NA 136 134* 132* 137 137  K 4.3 4.8 5.4* 4.6 5.1  CL 97* 96* 93* 95* 96*  CO2 26 25 24 28 26   GLUCOSE 125* 124* 170* 98 115*  BUN 40* 41* 50* 27* 38*  CREATININE 7.22* 7.30* 8.40* 5.46* 6.85*  CALCIUM 8.2* 8.6* 8.8* 8.6* 8.5*  MG  --  1.5* 1.9 1.8  --   PHOS  --  3.7 4.4 3.0  --    Liver Function Tests: No results for input(s): AST, ALT, ALKPHOS, BILITOT, PROT, ALBUMIN in the last 168 hours. No results for input(s): LIPASE, AMYLASE in the last 168 hours. No results for input(s): AMMONIA in the last 168 hours. CBC: Recent Labs  Lab 03/25/20 0605 03/27/20 0439 03/28/20 0446 03/29/20 0511 03/30/20 0521  WBC 5.7 5.8 7.5 5.8 5.5  HGB 10.2* 10.3* 10.1* 10.4* 10.1*  HCT 30.8* 31.9* 31.7* 31.2* 31.6*  MCV 95.7 97.0 97.2 95.4 97.5  PLT 148* 142* 149* 134* 133*   Cardiac Enzymes: No results for input(s): CKTOTAL, CKMB, CKMBINDEX, TROPONINI in the last 168 hours. BNP: Invalid input(s): POCBNP CBG: Recent Labs  Lab 03/29/20 1203 03/29/20 1650 03/29/20 2155 03/30/20 0747 03/30/20 2104  GLUCAP 139* 142* 179* 135*  184*   D-Dimer No results for input(s): DDIMER in the last 72 hours. Hgb A1c No results for input(s): HGBA1C in the last 72 hours. Lipid Profile No results for input(s): CHOL, HDL, LDLCALC, TRIG, CHOLHDL, LDLDIRECT in the last 72 hours. Thyroid function studies No results for input(s): TSH, T4TOTAL, T3FREE, THYROIDAB in the last 72 hours.  Invalid input(s): FREET3 Anemia work up No results for input(s): VITAMINB12, FOLATE, FERRITIN, TIBC, IRON, RETICCTPCT in the last 72 hours. Urinalysis    Component Value Date/Time   COLORURINE YELLOW 03/22/2019 1622   APPEARANCEUR CLEAR 03/22/2019 1622   APPEARANCEUR Clear 08/08/2013 1947   LABSPEC 1.018 03/22/2019 1622   LABSPEC 1.012 08/08/2013 1947   PHURINE 5.0 03/22/2019 1622   GLUCOSEU >=500 (A) 03/22/2019 1622   GLUCOSEU 50 mg/dL 08/08/2013 1947   HGBUR NEGATIVE 03/22/2019 1622   BILIRUBINUR NEGATIVE 03/22/2019 1622   BILIRUBINUR Negative 08/08/2013 1947   KETONESUR NEGATIVE 03/22/2019 1622   PROTEINUR >=300 (A) 03/22/2019 1622   NITRITE NEGATIVE 03/22/2019 1622   LEUKOCYTESUR NEGATIVE 03/22/2019 1622   LEUKOCYTESUR Negative 08/08/2013 1947   Sepsis Labs Invalid input(s): PROCALCITONIN,  WBC,  LACTICIDVEN Microbiology No results found for this or any previous visit (from the past 240 hour(s)).   Time coordinating discharge: Over 30 minutes  SIGNED:   Shawna Clamp, MD  [facility] 03/31/2020, 11:09 AM Pager   If 7PM-7AM, please contact night-coverage [email or web address]

## 2020-03-31 NOTE — Care Management Important Message (Signed)
Important Message  Patient Details  Name: Kyle [name] MRN: [id number] Date of Birth: [birth year]   Medicare Important Message Given:  Other (see comment)  Patient is on [place] and transferring [facility] today.  Out of respect for the patient and family no Important Message given.  Juliann Pulse A Velda Wendt 03/31/2020, 10:19 AM

## 2020-04-26 DEATH — deceased

## 2021-02-10 IMAGING — CT CT HEAD W/O CM
3 series · 14 of 47 positions shown, 16 images · non-contrast
Comparison: 01/25/2020

CLINICAL DATA: Fall with facial trauma

EXAM:
CT HEAD WITHOUT CONTRAST
CT MAXILLOFACIAL WITHOUT CONTRAST
CT CERVICAL SPINE WITHOUT CONTRAST
TECHNIQUE: Multidetector CT imaging of the head, cervical spine, and
maxillofacial structures were performed using the standard protocol
without intravenous contrast. Multiplanar CT image reconstructions
of the cervical spine and maxillofacial structures were also
generated.

[Series 3: coronal soft tissue · coronal · 0.31mm/px · 3 of 74 slices shown]
[im 26/74  brain]
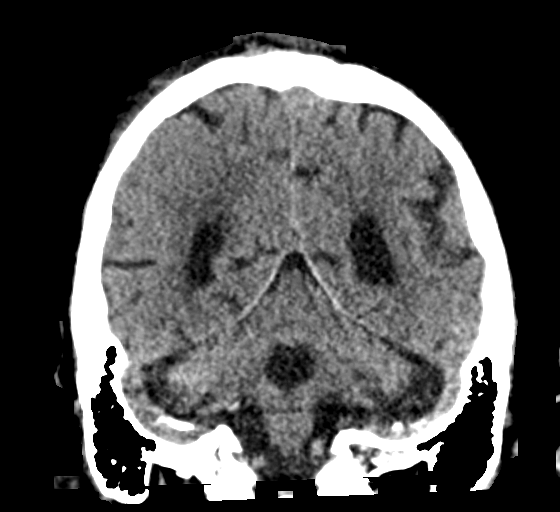
[im 33/74  brain]
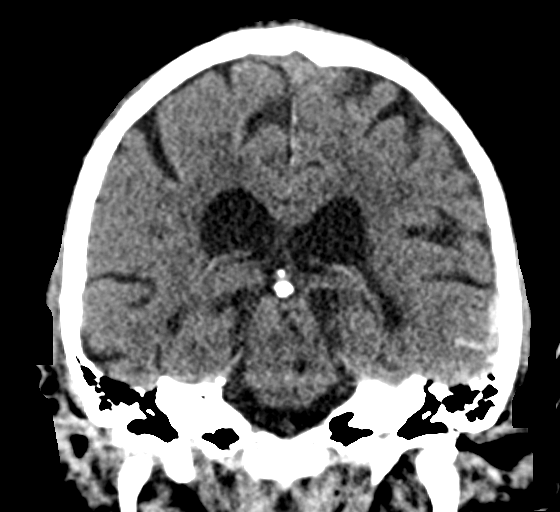
[im 41/74  brain]
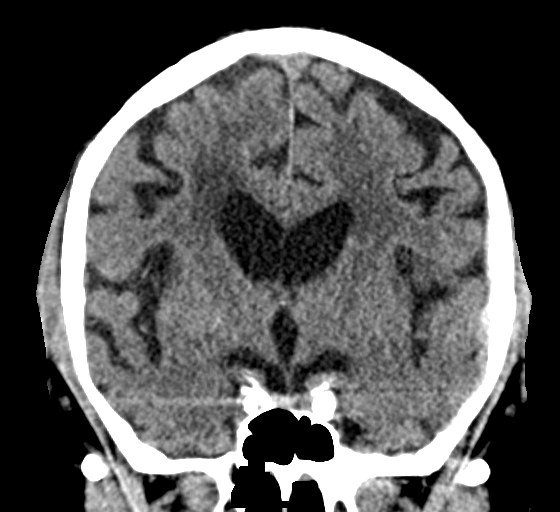

[Series 4: sagittal soft tissue · sagittal · 0.31mm/px · 3 of 57 slices shown]
[im 19/57  brain]
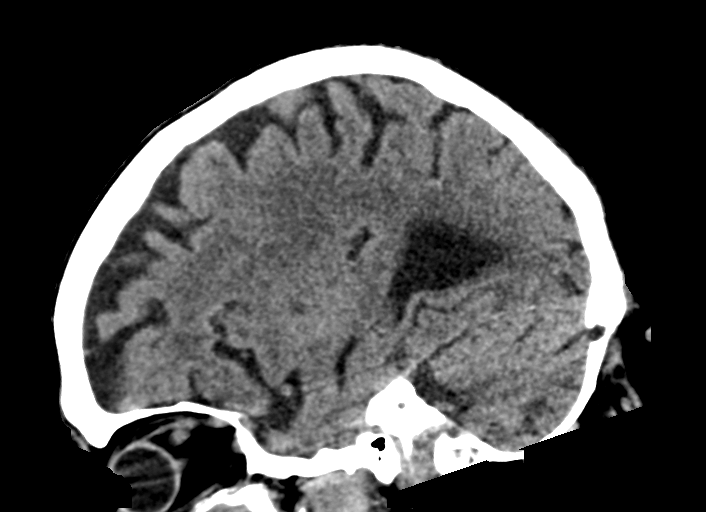
[im 29/57  brain]
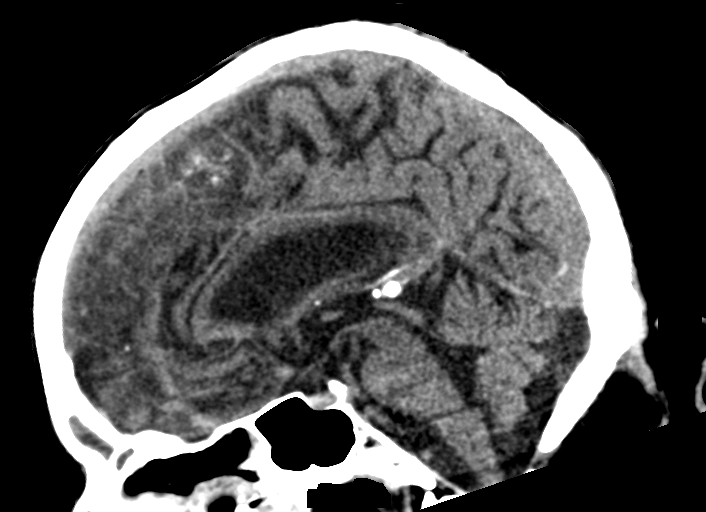
[im 38/57  brain]
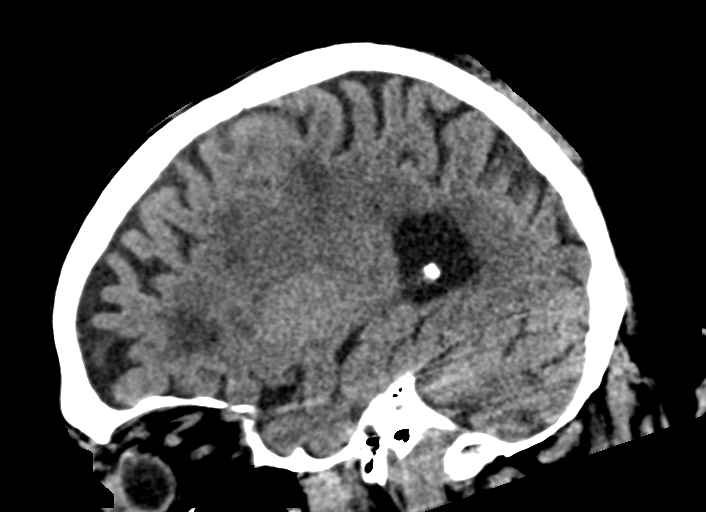

[Series 5: head wo · axial · 0.45mm/px · z∈[-120,+15]mm · 8 of 33 slices shown, 10 images]
[im 3/33  brain]
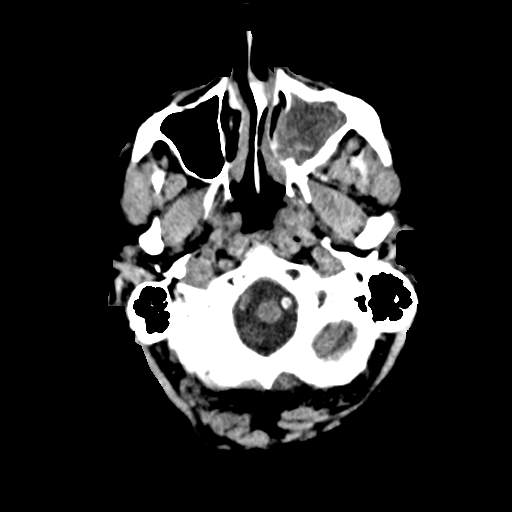
[im 3/33  bone]
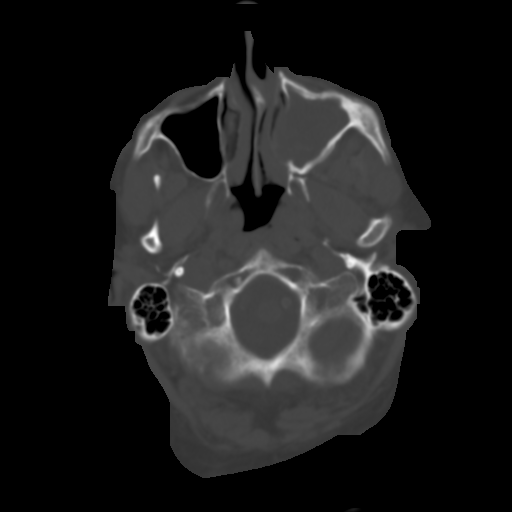
[im 7/33  brain]
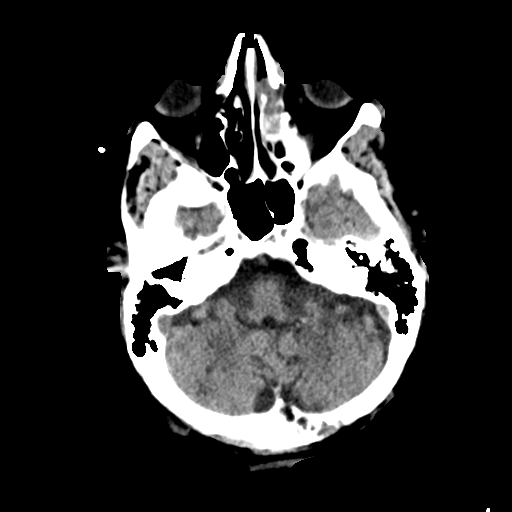
[im 10/33  brain]
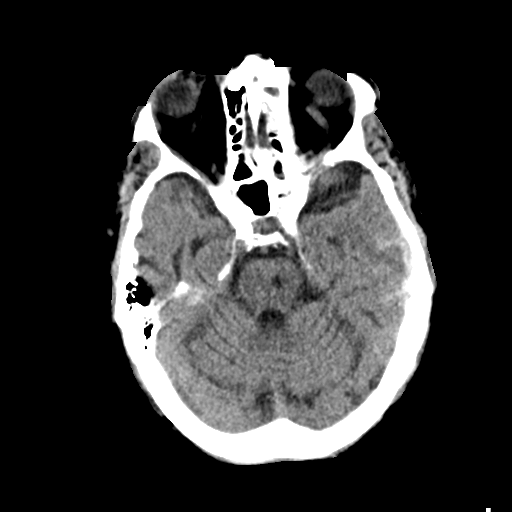
[im 15/33  brain]
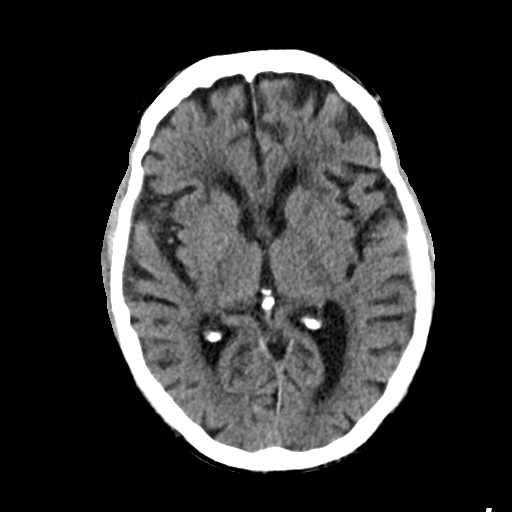
[im 18/33  brain]
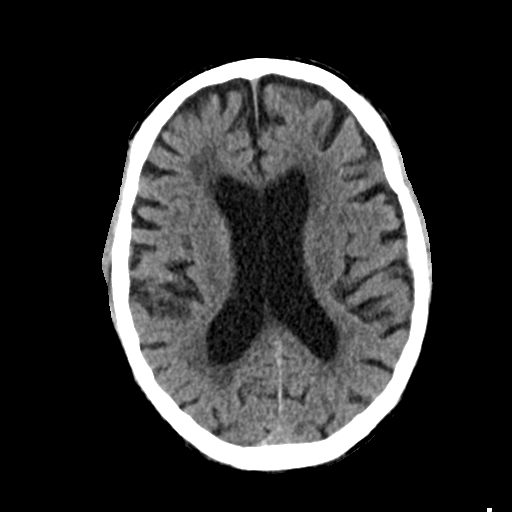
[im 18/33  bone]
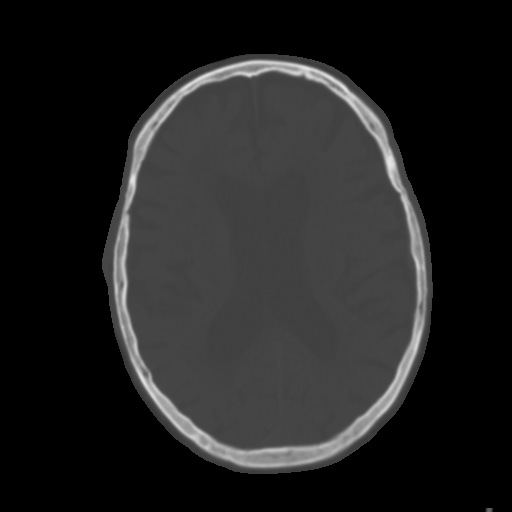
[im 23/33  brain]
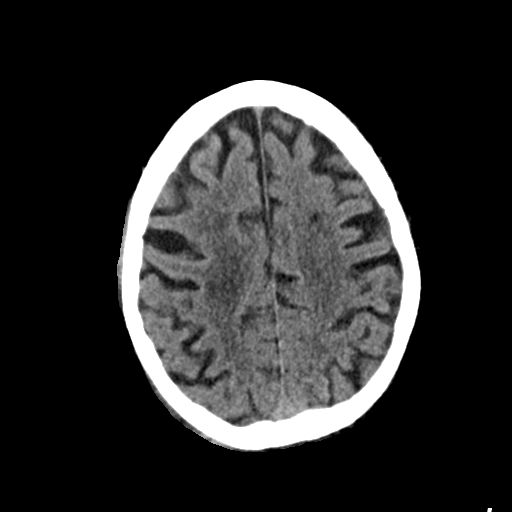
[im 26/33  brain]
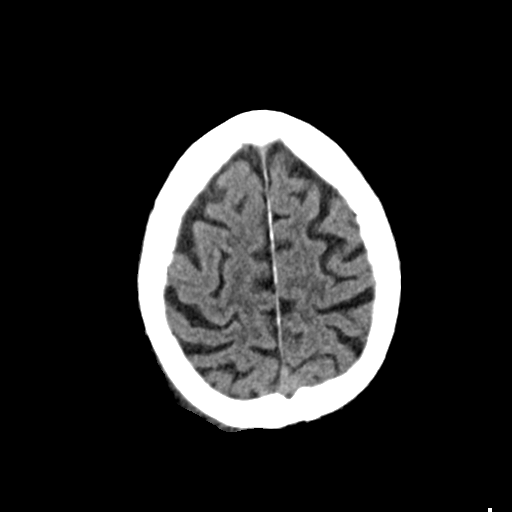
[im 30/33  brain]
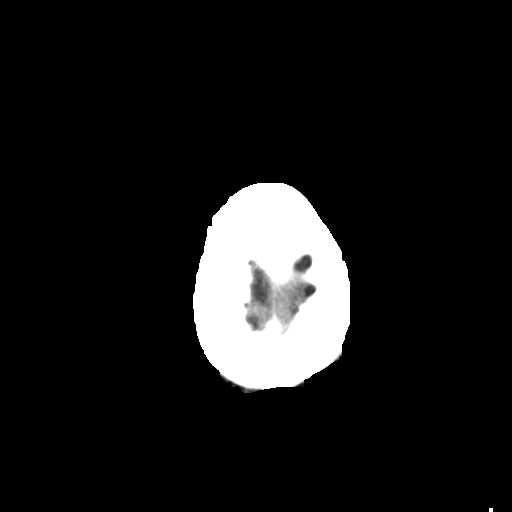

[14 of 47 positions shown; findings below may reference images not displayed]

FINDINGS: CT HEAD FINDINGS

Brain: High-density/acute hemorrhage in the subarachnoid and
possibly subdural spaces over the lateral left temporal lobe, in
total measuring up to 3 mm in thickness. No associated mass effect.
No visible parenchymal hemorrhage or swelling.

Brain atrophy and chronic small vessel ischemia. No evidence of
acute infarct. Small remote cerebellar and left pontine infarcts.

Vascular: No hyperdense vessel or unexpected calcification.

Skull: Posterior scalp swelling without calvarial fracture.

CT MAXILLOFACIAL FINDINGS

Osseous: No acute fracture or mandibular dislocation.

Orbits: Swelling superficial to the right orbit. No visible globe or
postseptal injury. Bilateral cataract resection. The superior
ophthalmic veins appear distended on both sides, often related to
Valsalva.

Sinuses: Chronic sinusitis with opacified left maxillary, anterior
ethmoid, and frontal sinuses. Left maxillary wall is sclerotic and
thickened with a broad dehiscence along the posterolateral wall that
is traversed by partially calcified material. No sinus mass was seen
on a April 07, 2009 brain MRI, but dehiscence had not been noted
at that time either.

Soft tissues: Hazy retro antral fat on the left, stable where
covered on prior head CT 01/25/2020.

CT CERVICAL SPINE FINDINGS

Alignment: No traumatic malalignment.

Skull base and vertebrae: Negative for cervical spine fracture. T1
and T2 superior endplate fractures with trabecular impaction,
nonacute in seen on a 2220 chest CT.

Soft tissues and spinal canal: No prevertebral fluid or swelling. No
visible canal hematoma. Mildly enlarged lymph nodes in the upper
right jugular chain, posterior to the internal jugular vein,
unchanged from December 2019 and thus presumably reactive

Disc levels: Multilevel degenerative facet spurring asymmetric to
the right. Ordinary mid and lower cervical disc degeneration.

Upper chest: Layering right pleural effusion, small where covered.

Critical Value/emergent results were called by telephone at the time
of interpretation on 03/20/2020 at [DATE] to provider EUISOO PIAZZA
, who verbally acknowledged these results.
IMPRESSION: 1. Small volume subarachnoid and possibly subdural hemorrhage along
the superficial left temporal lobe. No significant mass effect.
2. Scalp and facial swelling without acute fracture.
3. Negative for cervical spine fracture.
4. Chronic sinusitis from left OMU obstruction. Complicated disease
at the left maxillary sinus with chronic defect in the posterior
wall that does not appear progressed from December 2019 but is new
from February 2019. ENT referral is recommended if appropriate for
comorbidities.

## 2021-02-10 IMAGING — CT CT ANGIO HEAD
2 of 8 series · 8 of 33 positions shown · IV contrast (APPLIED)
Comparison: Head CT from earlier today.

CLINICAL DATA: Head trauma with intracranial arterial injury
suspected

EXAM:
CT ANGIOGRAPHY HEAD AND NECK
TECHNIQUE: Multidetector CT imaging of the head and neck was performed using
the standard protocol during bolus administration of intravenous
contrast. Multiplanar CT image reconstructions and MIPs were
obtained to evaluate the vascular anatomy. Carotid stenosis
measurements (when applicable) are obtained utilizing NASCET
criteria, using the distal internal carotid diameter as the
denominator.
CONTRAST:  75mL OMNIPAQUE IOHEXOL 350 MG/ML SOLN

[Series 505: cta head neck thins · axial · 0.43mm/px · z∈[-341,-72]mm · 6 of 746 slices shown]
[im 107/746  soft-tissue]
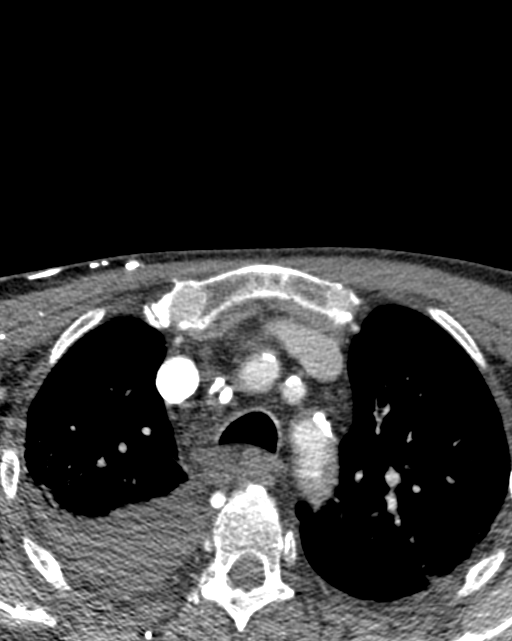
[im 213/746  bone]
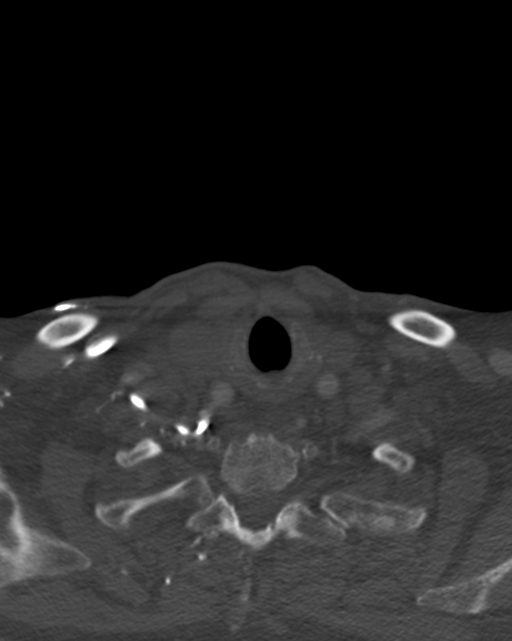
[im 320/746  soft-tissue]
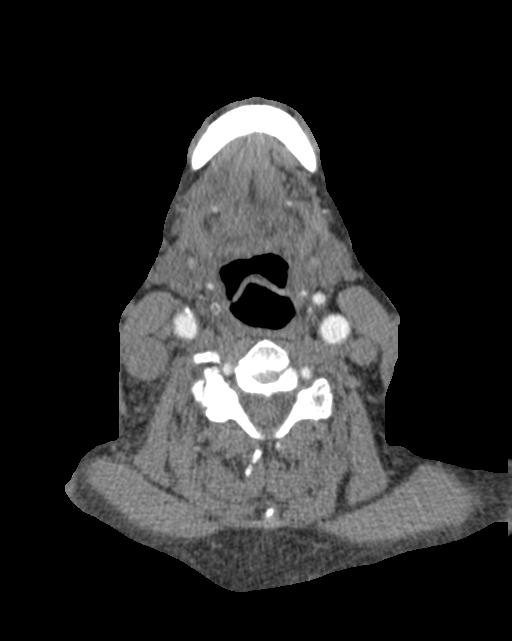
[im 426/746  bone]
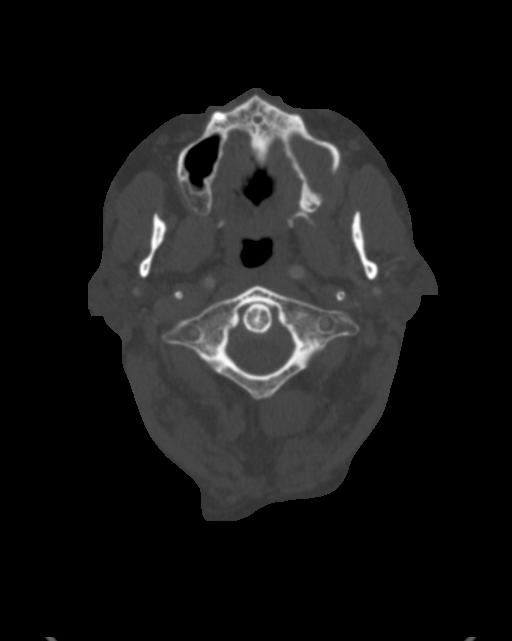
[im 533/746  soft-tissue]
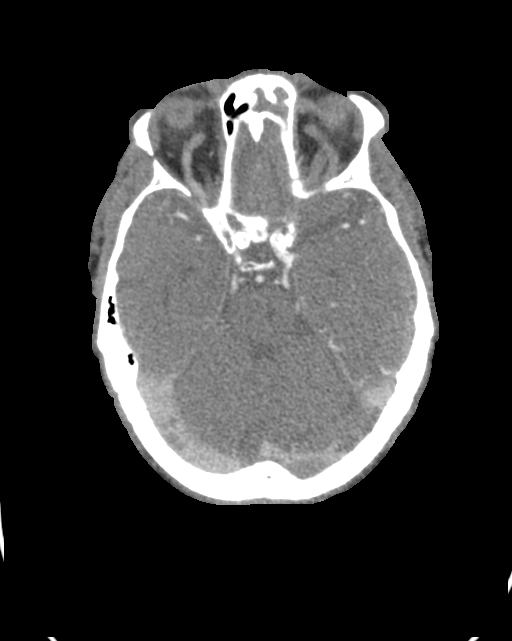
[im 639/746  bone]
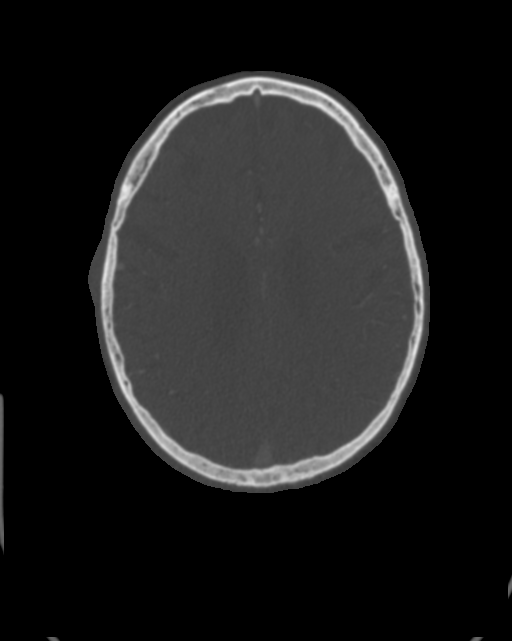

[Series 506: ax thin · axial · 0.43mm/px · z∈[-270,-143]mm · 2 of 373 slices shown]
[im 125/373  soft-tissue]
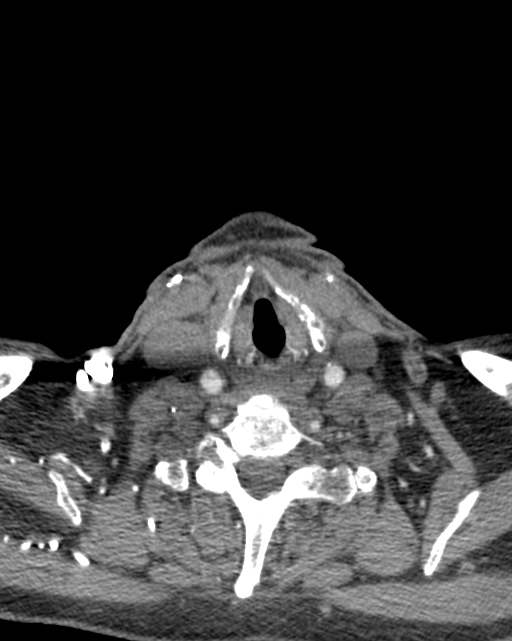
[im 249/373  soft-tissue]
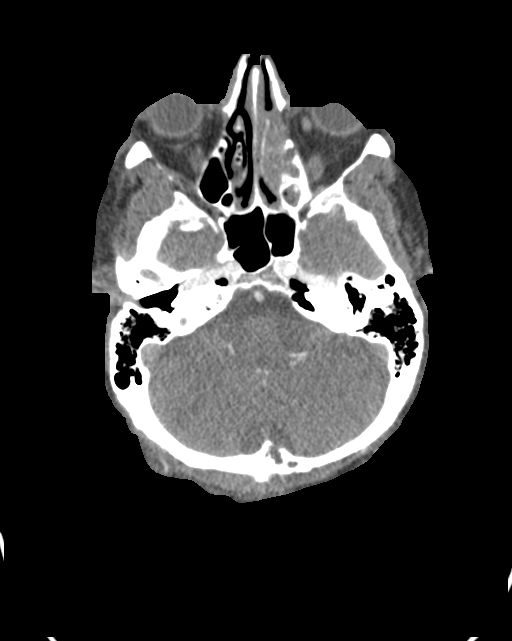

[8 of 33 positions shown; findings below may reference images not displayed]

FINDINGS: CTA NECK FINDINGS

Aortic arch: Atheromatous calcification.  No aneurysm or dissection

Right carotid system: Atheromatous wall thickening with
calcification at the bifurcation. No stenosis or ulceration.

Left carotid system: Atheromatous wall thickening of the common
carotid. No flow limiting stenosis or ulceration.

Vertebral arteries: No proximal subclavian stenosis. There are
codominant vertebral arteries that are smooth and widely patent to
the dura.

Skeleton: No acute finding.

Other neck: Left-sided sinusitis with OMU obstructive pattern and
cortical breakthrough at the posterior wall left maxillary sinus,
described on preceding head CT.

Upper chest: Moderate, layering right pleural effusion. Generous
mediastinal lymph nodes, likely reactive.

Review of the MIP images confirms the above findings

CTA HEAD FINDINGS

Anterior circulation: Heavily calcified carotid siphons with up to
50% stenosis on the right. No branch occlusion, beading, aneurysm,
or evidence of vascular malformation. Broad appearance at the
anterior communicating artery without discrete aneurysm. Essentially
azygos A2 segment.

Posterior circulation: Codominant vertebral arteries. Atheromatous
plaque asymmetric to the left V4 segment. The vertebral and basilar
arteries are smooth and widely patent. Fetal type left PCA. Negative
for aneurysm or vascular malformation

Venous sinuses: Unremarkable

Anatomic variants: As above

Review of the MIP images confirms the above findings
IMPRESSION: 1. No arterial cause/injury seen underlying the traumatic pattern
subarachnoid hemorrhage.
2. Cervical and intracranial carotid atherosclerosis with up to 50%
stenosis at the right cavernous ICA.
3. Layering right pleural effusion.
4. Complicated left sinusitis as previously described.

## 2021-12-25 ENCOUNTER — Encounter (INDEPENDENT_AMBULATORY_CARE_PROVIDER_SITE_OTHER): Payer: Self-pay
